# Patient Record
Sex: Male | Born: 1935 | Race: White | Hispanic: No | Marital: Married | Smoking: Former smoker
Health system: Southern US, Community
[De-identification: ages and names within clinical notes are randomized; demographics above are authoritative.]

## PROBLEM LIST (undated history)

## (undated) DIAGNOSIS — Z86718 Personal history of other venous thrombosis and embolism: Secondary | ICD-10-CM

## (undated) DIAGNOSIS — K627 Radiation proctitis: Secondary | ICD-10-CM

## (undated) DIAGNOSIS — D039 Melanoma in situ, unspecified: Secondary | ICD-10-CM

## (undated) DIAGNOSIS — F329 Major depressive disorder, single episode, unspecified: Secondary | ICD-10-CM

## (undated) DIAGNOSIS — Z862 Personal history of diseases of the blood and blood-forming organs and certain disorders involving the immune mechanism: Secondary | ICD-10-CM

## (undated) DIAGNOSIS — Z8619 Personal history of other infectious and parasitic diseases: Secondary | ICD-10-CM

## (undated) DIAGNOSIS — L120 Bullous pemphigoid: Secondary | ICD-10-CM

## (undated) DIAGNOSIS — R011 Cardiac murmur, unspecified: Secondary | ICD-10-CM

## (undated) DIAGNOSIS — C61 Malignant neoplasm of prostate: Secondary | ICD-10-CM

## (undated) DIAGNOSIS — Z974 Presence of external hearing-aid: Secondary | ICD-10-CM

## (undated) DIAGNOSIS — H8110 Benign paroxysmal vertigo, unspecified ear: Secondary | ICD-10-CM

## (undated) DIAGNOSIS — I209 Angina pectoris, unspecified: Secondary | ICD-10-CM

## (undated) DIAGNOSIS — I519 Heart disease, unspecified: Secondary | ICD-10-CM

## (undated) DIAGNOSIS — K219 Gastro-esophageal reflux disease without esophagitis: Secondary | ICD-10-CM

## (undated) DIAGNOSIS — Z8489 Family history of other specified conditions: Secondary | ICD-10-CM

## (undated) DIAGNOSIS — C799 Secondary malignant neoplasm of unspecified site: Secondary | ICD-10-CM

## (undated) DIAGNOSIS — C419 Malignant neoplasm of bone and articular cartilage, unspecified: Secondary | ICD-10-CM

## (undated) DIAGNOSIS — G4700 Insomnia, unspecified: Secondary | ICD-10-CM

## (undated) DIAGNOSIS — C439 Malignant melanoma of skin, unspecified: Secondary | ICD-10-CM

## (undated) DIAGNOSIS — K769 Liver disease, unspecified: Secondary | ICD-10-CM

## (undated) DIAGNOSIS — Z9289 Personal history of other medical treatment: Secondary | ICD-10-CM

## (undated) DIAGNOSIS — Z8679 Personal history of other diseases of the circulatory system: Secondary | ICD-10-CM

## (undated) DIAGNOSIS — I517 Cardiomegaly: Secondary | ICD-10-CM

## (undated) DIAGNOSIS — N2889 Other specified disorders of kidney and ureter: Secondary | ICD-10-CM

## (undated) DIAGNOSIS — Z9359 Other cystostomy status: Secondary | ICD-10-CM

## (undated) DIAGNOSIS — I2510 Atherosclerotic heart disease of native coronary artery without angina pectoris: Secondary | ICD-10-CM

## (undated) DIAGNOSIS — Z9889 Other specified postprocedural states: Secondary | ICD-10-CM

## (undated) DIAGNOSIS — N433 Hydrocele, unspecified: Secondary | ICD-10-CM

## (undated) DIAGNOSIS — Z87442 Personal history of urinary calculi: Secondary | ICD-10-CM

## (undated) DIAGNOSIS — E785 Hyperlipidemia, unspecified: Secondary | ICD-10-CM

## (undated) DIAGNOSIS — Z8719 Personal history of other diseases of the digestive system: Secondary | ICD-10-CM

## (undated) DIAGNOSIS — I700 Atherosclerosis of aorta: Secondary | ICD-10-CM

## (undated) DIAGNOSIS — Z923 Personal history of irradiation: Secondary | ICD-10-CM

## (undated) DIAGNOSIS — F32A Depression, unspecified: Secondary | ICD-10-CM

## (undated) HISTORY — DX: Personal history of irradiation: Z92.3

## (undated) HISTORY — DX: Melanoma in situ, unspecified: D03.9

## (undated) HISTORY — PX: TONSILLECTOMY: SUR1361

## (undated) HISTORY — DX: Hyperlipidemia, unspecified: E78.5

## (undated) HISTORY — PX: INGUINAL HERNIA REPAIR: SUR1180

## (undated) HISTORY — PX: CATARACT EXTRACTION, BILATERAL: SHX1313

## (undated) HISTORY — PX: NASAL FRACTURE SURGERY: SHX718

## (undated) HISTORY — DX: Secondary malignant neoplasm of unspecified site: C79.9

## (undated) HISTORY — DX: Gastro-esophageal reflux disease without esophagitis: K21.9

## (undated) HISTORY — DX: Malignant neoplasm of prostate: C61

## (undated) HISTORY — PX: FRACTURE SURGERY: SHX138

## (undated) HISTORY — PX: NASAL SEPTUM SURGERY: SHX37

## (undated) SURGERY — Surgical Case
Anesthesia: *Unknown

---
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
## 1998-04-06 ENCOUNTER — Ambulatory Visit (HOSPITAL_COMMUNITY): Admission: RE | Admit: 1998-04-06 | Discharge: 1998-04-06 | Payer: Self-pay | Admitting: Urology

## 1999-03-25 ENCOUNTER — Ambulatory Visit (HOSPITAL_COMMUNITY): Admission: RE | Admit: 1999-03-25 | Discharge: 1999-03-25 | Payer: Self-pay | Admitting: Gastroenterology

## 1999-05-09 ENCOUNTER — Encounter: Payer: Self-pay | Admitting: Internal Medicine

## 1999-05-09 ENCOUNTER — Ambulatory Visit: Admission: RE | Admit: 1999-05-09 | Discharge: 1999-05-09 | Payer: Self-pay | Admitting: Internal Medicine

## 2004-10-17 ENCOUNTER — Ambulatory Visit: Payer: Self-pay | Admitting: Internal Medicine

## 2004-10-31 ENCOUNTER — Ambulatory Visit: Payer: Self-pay | Admitting: Internal Medicine

## 2004-11-13 DIAGNOSIS — N433 Hydrocele, unspecified: Secondary | ICD-10-CM

## 2004-11-13 HISTORY — DX: Hydrocele, unspecified: N43.3

## 2005-05-12 ENCOUNTER — Ambulatory Visit: Payer: Self-pay | Admitting: Internal Medicine

## 2005-05-22 ENCOUNTER — Encounter: Admission: RE | Admit: 2005-05-22 | Discharge: 2005-05-22 | Payer: Self-pay | Admitting: Internal Medicine

## 2005-05-22 ENCOUNTER — Ambulatory Visit: Payer: Self-pay | Admitting: Internal Medicine

## 2005-10-16 ENCOUNTER — Ambulatory Visit: Payer: Self-pay | Admitting: Internal Medicine

## 2005-10-30 ENCOUNTER — Ambulatory Visit: Payer: Self-pay | Admitting: Internal Medicine

## 2006-05-08 ENCOUNTER — Ambulatory Visit: Payer: Self-pay | Admitting: Internal Medicine

## 2007-06-07 DIAGNOSIS — E785 Hyperlipidemia, unspecified: Secondary | ICD-10-CM

## 2007-06-07 DIAGNOSIS — Z860100 Personal history of colon polyps, unspecified: Secondary | ICD-10-CM | POA: Insufficient documentation

## 2007-07-04 ENCOUNTER — Ambulatory Visit: Payer: Self-pay | Admitting: Internal Medicine

## 2007-07-05 LAB — CONVERTED CEMR LAB
ALT: 20 units/L (ref 0–53)
AST: 22 units/L (ref 0–37)
Albumin: 3.9 g/dL (ref 3.5–5.2)
Alkaline Phosphatase: 64 units/L (ref 39–117)
Bilirubin, Direct: 0.1 mg/dL (ref 0.0–0.3)
Cholesterol: 185 mg/dL (ref 0–200)
HDL: 43.3 mg/dL (ref >39.0)
LDL Cholesterol: 125 mg/dL — ABNORMAL HIGH (ref 0–99)
PSA: 1.05 ng/mL (ref 0.10–4.00)
TSH: 2.42 microintl units/mL (ref 0.35–5.50)
Testosterone: 294.23 ng/dL — ABNORMAL LOW (ref 350.00–890)
Total Bilirubin: 1 mg/dL (ref 0.3–1.2)
Total CHOL/HDL Ratio: 4.3
Total Protein: 6.5 g/dL (ref 6.0–8.3)
Triglycerides: 86 mg/dL (ref 0–149)
VLDL: 17 mg/dL (ref 0–40)

## 2007-07-08 DIAGNOSIS — N529 Male erectile dysfunction, unspecified: Secondary | ICD-10-CM

## 2007-09-30 ENCOUNTER — Encounter: Payer: Self-pay | Admitting: Internal Medicine

## 2008-02-14 ENCOUNTER — Encounter: Payer: Self-pay | Admitting: Internal Medicine

## 2008-08-18 ENCOUNTER — Encounter: Payer: Self-pay | Admitting: Internal Medicine

## 2008-11-05 ENCOUNTER — Encounter: Payer: Self-pay | Admitting: Internal Medicine

## 2009-01-13 ENCOUNTER — Ambulatory Visit: Payer: Self-pay | Admitting: Family Medicine

## 2009-01-25 ENCOUNTER — Ambulatory Visit: Payer: Self-pay | Admitting: Family Medicine

## 2009-11-30 ENCOUNTER — Ambulatory Visit: Payer: Self-pay | Admitting: Family Medicine

## 2010-02-07 ENCOUNTER — Ambulatory Visit: Payer: Self-pay | Admitting: Family Medicine

## 2010-04-06 ENCOUNTER — Ambulatory Visit: Payer: Self-pay | Admitting: Family Medicine

## 2010-12-23 ENCOUNTER — Ambulatory Visit (INDEPENDENT_AMBULATORY_CARE_PROVIDER_SITE_OTHER): Payer: Medicare Other | Admitting: Family Medicine

## 2010-12-23 DIAGNOSIS — R079 Chest pain, unspecified: Secondary | ICD-10-CM

## 2011-04-17 ENCOUNTER — Telehealth: Payer: Self-pay | Admitting: Family Medicine

## 2011-04-17 NOTE — Telephone Encounter (Signed)
He was immunized against it in March of 2010.

## 2011-04-17 NOTE — Telephone Encounter (Signed)
PT & WIFE EXPECTING FIRST GRANDCHILD 8/12 AND HAS QUESTIONS IF NEED WHOOPING COUGH SHOT. PT IS 75 AND WIFE FRANCIS IS 67. PLEASE CALL PT TO ANSWER QUESTIONS.

## 2011-04-20 NOTE — Telephone Encounter (Signed)
Pt notified l/m.

## 2011-05-08 ENCOUNTER — Other Ambulatory Visit: Payer: Self-pay | Admitting: Family Medicine

## 2011-05-09 ENCOUNTER — Encounter: Payer: Self-pay | Admitting: Family Medicine

## 2011-06-05 ENCOUNTER — Other Ambulatory Visit: Payer: Self-pay | Admitting: Family Medicine

## 2011-11-02 ENCOUNTER — Ambulatory Visit (INDEPENDENT_AMBULATORY_CARE_PROVIDER_SITE_OTHER): Payer: Medicare Other | Admitting: Family Medicine

## 2011-11-02 ENCOUNTER — Telehealth: Payer: Self-pay

## 2011-11-02 ENCOUNTER — Encounter: Payer: Self-pay | Admitting: Family Medicine

## 2011-11-02 DIAGNOSIS — E785 Hyperlipidemia, unspecified: Secondary | ICD-10-CM

## 2011-11-02 DIAGNOSIS — R490 Dysphonia: Secondary | ICD-10-CM

## 2011-11-02 DIAGNOSIS — Z79899 Other long term (current) drug therapy: Secondary | ICD-10-CM

## 2011-11-02 DIAGNOSIS — Z86010 Personal history of colonic polyps: Secondary | ICD-10-CM

## 2011-11-02 LAB — CBC WITH DIFFERENTIAL/PLATELET
Basophils Relative: 2 % — ABNORMAL HIGH (ref 0–1)
Eosinophils Relative: 3 % (ref 0–5)
Lymphocytes Relative: 33 % (ref 12–46)
Lymphs Abs: 1.5 10*3/uL (ref 0.7–4.0)
MCHC: 33.2 g/dL (ref 30.0–36.0)
Monocytes Relative: 13 % — ABNORMAL HIGH (ref 3–12)
Neutro Abs: 2.2 10*3/uL (ref 1.7–7.7)
Neutrophils Relative %: 49 % (ref 43–77)
Platelets: 286 10*3/uL (ref 150–400)
RDW: 13.5 % (ref 11.5–15.5)

## 2011-11-02 LAB — COMPREHENSIVE METABOLIC PANEL
ALT: 10 U/L (ref 0–53)
AST: 19 U/L (ref 0–37)
Alkaline Phosphatase: 62 U/L (ref 39–117)
BUN: 17 mg/dL (ref 6–23)
Sodium: 138 mEq/L (ref 135–145)
Total Bilirubin: 0.6 mg/dL (ref 0.3–1.2)

## 2011-11-02 LAB — LIPID PANEL
Cholesterol: 249 mg/dL — ABNORMAL HIGH (ref 0–200)
HDL: 54 mg/dL (ref >39)
Total CHOL/HDL Ratio: 4.6 Ratio
Triglycerides: 70 mg/dL (ref <150)

## 2011-11-02 NOTE — Progress Notes (Signed)
  Subjective:    Patient ID: Corey Oneill, male    DOB: [birth year], 75 y.o.   MRN: [id number]  HPI He is here for an interval evaluation. He has had difficulty over the last year with a raspy sounding voice but no sore throat. He states that sometimes he feels as if food does get stuck but this is intermittent. This has been getting worse. He also has a history of colonic polyps and has been evaluated in the past by Dr. Kinnie Scales. He has noted a slight weight loss. He states it has been no change in his bowel habits, abdominal pain or anorexia. He is now a grandfather and is quite happy over this. He continues on medications listed in the chart. He does have a remote smoking history. Review of Systems     Objective:   Physical Exam alert and in no distress. Tympanic membranes and canals are normal. Throat is clear. Tonsils are normal. Neck is supple without adenopathy or thyromegaly. Cardiac exam shows a regular sinus rhythm without murmurs or gallops. Lungs are clear to auscultation.        Assessment & Plan:   1. Hoarse voice quality  Ambulatory referral to ENT  2. Hyperlipidemia LDL goal < 100  CBC with Differential, Comprehensive metabolic panel, Lipid panel  3. History of colonic polyps    4. Encounter for long-term (current) use of other medications  CBC with Differential, Comprehensive metabolic panel, Lipid panel, US Aorta Initial Medicare Screen   recommend he try Prilosec 40 mg to see if this will help with his raspy voice. He will followup with his gastroenterologist concerning his next colonoscopy. Routine abdominal aortic ultrasound ordered.

## 2011-11-02 NOTE — Patient Instructions (Signed)
Use 40mg  Prilosec at see if this helps the raspy voice

## 2011-11-02 NOTE — Telephone Encounter (Signed)
Left message for pt that he has Ultrasound on Dec 26 th @ 10 am @ [facility] [address] nothing to eat or drink after midnight

## 2011-11-06 ENCOUNTER — Telehealth: Payer: Self-pay | Admitting: Internal Medicine

## 2011-11-06 MED ORDER — SIMVASTATIN 40 MG PO TABS: 40.0000 mg | ORAL_TABLET | Freq: Every day | ORAL | Status: DC

## 2011-11-08 ENCOUNTER — Ambulatory Visit (INDEPENDENT_AMBULATORY_CARE_PROVIDER_SITE_OTHER): Payer: Medicare Other | Admitting: Family Medicine

## 2011-11-08 ENCOUNTER — Ambulatory Visit: Payer: PRIVATE HEALTH INSURANCE

## 2011-11-08 DIAGNOSIS — E785 Hyperlipidemia, unspecified: Secondary | ICD-10-CM

## 2011-11-08 MED ORDER — SIMVASTATIN 40 MG PO TABS: 40.0000 mg | ORAL_TABLET | Freq: Every day | ORAL | Status: DC

## 2011-11-08 NOTE — Telephone Encounter (Signed)
done

## 2011-11-08 NOTE — Patient Instructions (Signed)
Continue on Prilosec and your Zocor

## 2011-11-08 NOTE — Progress Notes (Signed)
  Subjective:    Patient ID: Corey Oneill, male    DOB: 06-14-1936, 75 y.o.   MRN: [id number]  HPI He is here for consultation concerning some blood work which did show elevated lipids. He stopped taking his statin because of fears concerning possible side effects. When he got the message to return here, he started back on his medications. He also notes that the Prilosec seems to be helping with his throat symptoms.   Review of Systems     Objective:   Physical Exam Alert and in no distress otherwise not examined      Assessment & Plan:   1. HYPERLIPIDEMIA    he'll continue on Zocor and recheck here in 2 months. He is to continue on Prilosec for the time being

## 2011-11-13 ENCOUNTER — Other Ambulatory Visit: Payer: Self-pay | Admitting: Family Medicine

## 2011-11-13 ENCOUNTER — Ambulatory Visit
Admission: RE | Admit: 2011-11-13 | Discharge: 2011-11-13 | Disposition: A | Discharge status: Home / Self Care | Payer: Medicare Other | Source: Ambulatory Visit | Attending: Family Medicine | Admitting: Family Medicine

## 2011-11-13 DIAGNOSIS — Z79899 Other long term (current) drug therapy: Secondary | ICD-10-CM

## 2011-11-21 DIAGNOSIS — J320 Chronic maxillary sinusitis: Secondary | ICD-10-CM | POA: Diagnosis not present

## 2011-11-27 DIAGNOSIS — J320 Chronic maxillary sinusitis: Secondary | ICD-10-CM | POA: Diagnosis not present

## 2011-12-22 ENCOUNTER — Telehealth: Payer: Self-pay | Admitting: Internal Medicine

## 2011-12-25 NOTE — Telephone Encounter (Signed)
He was referred to Evette Cristal for counseling

## 2012-01-22 DIAGNOSIS — F4322 Adjustment disorder with anxiety: Secondary | ICD-10-CM | POA: Diagnosis not present

## 2012-02-01 DIAGNOSIS — F4322 Adjustment disorder with anxiety: Secondary | ICD-10-CM | POA: Diagnosis not present

## 2012-04-17 ENCOUNTER — Encounter: Payer: Self-pay | Admitting: Family Medicine

## 2012-04-17 ENCOUNTER — Ambulatory Visit (INDEPENDENT_AMBULATORY_CARE_PROVIDER_SITE_OTHER): Payer: Medicare Other | Admitting: Family Medicine

## 2012-04-17 VITALS — BP 120/70 | HR 74 | Wt 162.0 lb

## 2012-04-17 DIAGNOSIS — Z86010 Personal history of colonic polyps: Secondary | ICD-10-CM | POA: Diagnosis not present

## 2012-04-17 DIAGNOSIS — R42 Dizziness and giddiness: Secondary | ICD-10-CM | POA: Diagnosis not present

## 2012-04-17 LAB — CBC WITH DIFFERENTIAL/PLATELET
Basophils Absolute: 0.1 10*3/uL (ref 0.0–0.1)
Basophils Relative: 2 % — ABNORMAL HIGH (ref 0–1)
Eosinophils Absolute: 0.2 10*3/uL (ref 0.0–0.7)
Lymphocytes Relative: 33 % (ref 12–46)
MCH: 31.2 pg (ref 26.0–34.0)
Monocytes Absolute: 0.5 10*3/uL (ref 0.1–1.0)
Neutro Abs: 2.2 10*3/uL (ref 1.7–7.7)
RBC: 4.33 MIL/uL (ref 4.22–5.81)
RDW: 13.3 % (ref 11.5–15.5)
WBC: 4.5 10*3/uL (ref 4.0–10.5)

## 2012-04-17 LAB — COMPREHENSIVE METABOLIC PANEL
AST: 21 U/L (ref 0–37)
Albumin: 4.2 g/dL (ref 3.5–5.2)
Alkaline Phosphatase: 61 U/L (ref 39–117)
CO2: 30 mEq/L (ref 19–32)
Chloride: 105 mEq/L (ref 96–112)
Creat: 0.93 mg/dL (ref 0.50–1.35)
Glucose, Bld: 78 mg/dL (ref 70–99)
Potassium: 4.8 mEq/L (ref 3.5–5.3)
Total Bilirubin: 0.6 mg/dL (ref 0.3–1.2)

## 2012-04-17 NOTE — Progress Notes (Signed)
  Subjective:    Patient ID: Corey Oneill, male    DOB: 09-26-1936, 76 y.o.   MRN: [id number]  HPI He is here for consultation. He states that over the last 6-8 months he has noted weight loss stating that his pants are fitting much looser. He has a previous history of colonic polyps and does need to be rescheduled for colonoscopy since it has been 5 years He's had no nausea, vomiting, abdominal pain, change in bowel habit, fever, chills, chest pain or shortness of breath. He has had 2 episodes of being lightheaded that lasted for several minutes but again none of the above symptoms plus blurred or double vision, weakness, heart rate changes.   Review of Systems     Objective:   Physical Exam alert and in no distress. Tympanic membranes and canals are normal. Throat is clear. Tonsils are normal. Neck is supple without adenopathy or thyromegaly. Cardiac exam shows a regular sinus rhythm without murmurs or gallops. Lungs are clear to auscultation. No carotid bruits noted. Donald exam shows no masses or tenderness       Assessment & Plan:   1. Light headed  CBC with Differential, Comprehensive metabolic panel  2. History of colonic polyps  CBC with Differential, Comprehensive metabolic panel, [facility] COLONOSCOPY   reassured him that I did not finding significant with the episodes of being lightheaded but did encourage him to be vigilant in that regard. I will also refer for colonoscopy since he is due for one anyway due to his underlying history of polyps.

## 2012-04-19 NOTE — Progress Notes (Signed)
Quick Note:  The blood work is normal ______ 

## 2012-04-29 DIAGNOSIS — L821 Other seborrheic keratosis: Secondary | ICD-10-CM | POA: Diagnosis not present

## 2012-04-29 DIAGNOSIS — L820 Inflamed seborrheic keratosis: Secondary | ICD-10-CM | POA: Diagnosis not present

## 2012-04-29 DIAGNOSIS — D485 Neoplasm of uncertain behavior of skin: Secondary | ICD-10-CM | POA: Diagnosis not present

## 2012-08-19 DIAGNOSIS — H40019 Open angle with borderline findings, low risk, unspecified eye: Secondary | ICD-10-CM | POA: Diagnosis not present

## 2012-08-19 DIAGNOSIS — H35379 Puckering of macula, unspecified eye: Secondary | ICD-10-CM | POA: Diagnosis not present

## 2012-08-19 DIAGNOSIS — H01009 Unspecified blepharitis unspecified eye, unspecified eyelid: Secondary | ICD-10-CM | POA: Diagnosis not present

## 2012-08-19 DIAGNOSIS — H2510 Age-related nuclear cataract, unspecified eye: Secondary | ICD-10-CM | POA: Diagnosis not present

## 2012-09-04 DIAGNOSIS — Z23 Encounter for immunization: Secondary | ICD-10-CM | POA: Diagnosis not present

## 2012-09-09 DIAGNOSIS — D126 Benign neoplasm of colon, unspecified: Secondary | ICD-10-CM | POA: Diagnosis not present

## 2012-09-09 DIAGNOSIS — K648 Other hemorrhoids: Secondary | ICD-10-CM | POA: Diagnosis not present

## 2012-09-09 DIAGNOSIS — Z86010 Personal history of colonic polyps: Secondary | ICD-10-CM | POA: Diagnosis not present

## 2012-09-10 ENCOUNTER — Encounter: Payer: Self-pay | Admitting: Family Medicine

## 2012-11-13 HISTORY — PX: PROSTATE BIOPSY: SHX241

## 2012-11-19 ENCOUNTER — Telehealth: Payer: Self-pay | Admitting: Internal Medicine

## 2012-11-19 DIAGNOSIS — Z0279 Encounter for issue of other medical certificate: Secondary | ICD-10-CM

## 2012-11-19 NOTE — Telephone Encounter (Signed)
Faxed over medical records for pt to [facility] processing center

## 2012-11-27 ENCOUNTER — Telehealth: Payer: Self-pay | Admitting: Family Medicine

## 2012-11-27 NOTE — Telephone Encounter (Signed)
Pt called and stated that he recently tried to purchase life insurance and they sent a rep out to his house for a at house "physical". He states that he received a call today stating that he has issues with some of his labs. He stated that he was informed that his HGB was 12.1 and is PSA was 5.14. He was informed that these were not within normal range. Pt is concerned. Pt has not had a cpe with [place] in a while and last time labs were drawn was back in June. Pt is willing to come in to see you but I'm not sure what kind of appointment to put him in for. Pt also states that he really wants to purchase this ins and these matters need to be addressed before this can happen. He did state that they would take a letter from you stating that he was in good health. Please call pt and address concerns or if a appointment is needed please let me know if you want him to have a cpe or what. It will be several weeks before we can get him in for a cpe, next week for a consult.

## 2012-11-27 NOTE — Telephone Encounter (Signed)
Set him up for a 30 minute visit

## 2012-11-28 NOTE — Telephone Encounter (Signed)
CALLED PT HE HAS APT ON Monday HE IS TO CALL Monday TO LET ME KNOW IF HE HAS HIS LABS AND HE IS ALSO GOING TO TRY AND HAVE HIS COMPANY FAX [place] A COPY

## 2012-12-02 ENCOUNTER — Other Ambulatory Visit: Payer: Self-pay | Admitting: Family Medicine

## 2012-12-02 ENCOUNTER — Ambulatory Visit (INDEPENDENT_AMBULATORY_CARE_PROVIDER_SITE_OTHER): Payer: Medicare Other | Admitting: Family Medicine

## 2012-12-02 ENCOUNTER — Encounter: Payer: Self-pay | Admitting: Family Medicine

## 2012-12-02 DIAGNOSIS — R972 Elevated prostate specific antigen [PSA]: Secondary | ICD-10-CM

## 2012-12-02 DIAGNOSIS — D649 Anemia, unspecified: Secondary | ICD-10-CM

## 2012-12-02 LAB — CBC WITH DIFFERENTIAL/PLATELET
Eosinophils Absolute: 0.1 10*3/uL (ref 0.0–0.7)
Eosinophils Relative: 3 % (ref 0–5)
HCT: 35.4 % — ABNORMAL LOW (ref 39.0–52.0)
Lymphocytes Relative: 30 % (ref 12–46)
MCH: 30.6 pg (ref 26.0–34.0)
MCV: 89.4 fL (ref 78.0–100.0)
Neutro Abs: 2.7 10*3/uL (ref 1.7–7.7)
Neutrophils Relative %: 54 % (ref 43–77)
RBC: 3.96 MIL/uL — ABNORMAL LOW (ref 4.22–5.81)
WBC: 4.8 10*3/uL (ref 4.0–10.5)

## 2012-12-02 NOTE — Progress Notes (Signed)
  Subjective:    Patient ID: Corey Oneill, male    DOB: Apr 11, 1936, 77 y.o.   MRN: [id number]  HPI He recently applied for an insurance plan. Blood work was taken which did show an elevated PSA as well as hemoglobin of 12. Both of these were outside of their norms. He is almost 77 years of age. He has no other concerns or complaints.   Review of Systems     Objective:   Physical Exam Alert and in no distress otherwise not examined       Assessment & Plan:   1. Abnormal PSA  PSA  2. Anemia  CBC with Differential   I discussed PSA with him in detail. Recommend that we repeat the PSA to see what the numbers today. Explained that the PSA can fluctuate greatly. I will also check a complete CBC to see if perhaps he needs iron supplementation. Explained that hemoglobin 12 is certainly not dangerous but is slightly low.

## 2012-12-03 LAB — PSA: PSA: 5.08 ng/mL — ABNORMAL HIGH (ref <=4.00)

## 2012-12-03 LAB — PSA, TOTAL AND FREE: PSA, Free Pct: 10 % — ABNORMAL LOW (ref >25)

## 2012-12-03 NOTE — Progress Notes (Signed)
Gave to jo 

## 2012-12-05 NOTE — Progress Notes (Signed)
Quick Note:  I discussed both the hemoglobin and his PSA. He will be on a multivitamin with extra iron to be on the safe side. We also discussed PSA and he would like referral to urology. Please to set that up. Send all the lab data ______

## 2012-12-06 NOTE — Progress Notes (Signed)
Quick Note:  FAXED ALL INFO TO [facility] ______

## 2012-12-13 ENCOUNTER — Telehealth: Payer: Self-pay | Admitting: Internal Medicine

## 2012-12-13 MED ORDER — SIMVASTATIN 40 MG PO TABS: 40.0000 mg | ORAL_TABLET | Freq: Every day | ORAL | Status: DC

## 2012-12-13 NOTE — Telephone Encounter (Signed)
Sent med in 

## 2012-12-16 ENCOUNTER — Telehealth: Payer: Self-pay | Admitting: Family Medicine

## 2012-12-16 NOTE — Telephone Encounter (Signed)
Please call concerning referral to [facility], he wants to give you an update

## 2012-12-17 NOTE — Telephone Encounter (Signed)
CALLED PT TO FIND OUT WHAT WAS GOING ON LEFT MESSAGE TO CALL ME BACK

## 2012-12-18 ENCOUNTER — Encounter: Payer: Self-pay | Admitting: Family Medicine

## 2012-12-30 ENCOUNTER — Telehealth: Payer: Self-pay | Admitting: Family Medicine

## 2012-12-30 NOTE — Telephone Encounter (Signed)
Call him up and have him stop by so I can take all of the disc is I have no record of him having difficulty with herpes.

## 2012-12-30 NOTE — Telephone Encounter (Signed)
WIFE said to remind you to order Valtrex for her husband to [facility]

## 2012-12-30 NOTE — Telephone Encounter (Signed)
PT WAS INFORMED TO COME IN HE SAID THAT HE WANTED TO WAIT TILL NEXT TIME

## 2013-01-22 DIAGNOSIS — R972 Elevated prostate specific antigen [PSA]: Secondary | ICD-10-CM | POA: Diagnosis not present

## 2013-02-13 DIAGNOSIS — C61 Malignant neoplasm of prostate: Secondary | ICD-10-CM | POA: Diagnosis not present

## 2013-02-18 DIAGNOSIS — C61 Malignant neoplasm of prostate: Secondary | ICD-10-CM | POA: Diagnosis not present

## 2013-02-26 ENCOUNTER — Telehealth: Payer: Self-pay | Admitting: *Deleted

## 2013-02-26 NOTE — Telephone Encounter (Signed)
CALLED PATIENT TO ALTER [place] VISIT FOR 03-06-13 DUE TO DR. Dayton Scrape BEING IN THE OR, RESCHEDULED FOR 03-05-13 1:30 PM FOR 2:00 PM APPT, SPOKE WITH PATIENT AND HE IS GOOD WITH NEW APPT. DATE AND TIME.

## 2013-03-03 ENCOUNTER — Encounter: Payer: Self-pay | Admitting: Oncology

## 2013-03-03 NOTE — Progress Notes (Signed)
GU Location of Tumor / Histology: prostate  If Prostate Cancer, Gleason Score is (3 + 4=7, 4 cores on the right, 3+3=6 , 2 cores on the right) and PSA is (4.97)  Patient presented 4 months ago with signs/symptoms of: elevated PSA  Biopsies of prostate (if applicable) revealed: adenocarcinoma of the prostate in 6 samples  Past/Anticipated interventions by urology, if any: prostate biopsy, possible lab assisted robotic prostatectomy  Past/Anticipated interventions by medical oncology, if any: none  Weight changes, if any: no    Bowel/Bladder complaints, if any:  Urinary stream weaker at night and returns to normal in the morning.  Usually gets up once per night to urinate.  Nausea/Vomiting, if any:  no  Pain issues, if any:  no  SAFETY ISSUES:  Prior radiation? no  Pacemaker/ICD? no  Possible current pregnancy? no  Is the patient on methotrexate? no  Current Complaints / other details:  Corey Oneill here for consult for prostate cancer.  He denies pain, fatigue, diarrhea, and hematuria.

## 2013-03-05 ENCOUNTER — Encounter: Payer: Self-pay | Admitting: Radiation Oncology

## 2013-03-05 ENCOUNTER — Ambulatory Visit
Admission: RE | Admit: 2013-03-05 | Discharge: 2013-03-05 | Disposition: A | Discharge status: Home / Self Care | Payer: Medicare Other | Source: Ambulatory Visit | Attending: Radiation Oncology | Admitting: Radiation Oncology

## 2013-03-05 VITALS — BP 132/66 | HR 83 | Temp 97.7°F | Ht 72.0 in | Wt 163.2 lb

## 2013-03-05 DIAGNOSIS — C61 Malignant neoplasm of prostate: Secondary | ICD-10-CM | POA: Insufficient documentation

## 2013-03-05 DIAGNOSIS — N529 Male erectile dysfunction, unspecified: Secondary | ICD-10-CM | POA: Insufficient documentation

## 2013-03-05 NOTE — Progress Notes (Signed)
Please see the Nurse Progress Note in the MD Initial Consult Encounter for this patient. 

## 2013-03-05 NOTE — Progress Notes (Signed)
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 male who is seen today for the courtesy of Dr. Mena Goes of for evaluation of his clinical stage T2 B. intermediate risk adenocarcinoma prostate. His PSA in August of 2004 was 0.4 rise and 1.05 by August of 2008. His next PSA in January 2014 was elevated at 5.08 and rechecked on January 20 and found to be 4.97 with a percentage free PSA of 10%. He was seen by Dr. Mena Goes who performed ultrasound-guided biopsies on 01/22/2013. 6 of 12 biopsies contained adenocarcinoma. He was found to have Gleason score 7 (3+4) involving 20% of one core from right lateral base, 20% of one core from the right mid gland, 20% of one core from the right lateral apex and 60% of one core from the right apex. He Gleason 6 (3+3) involving 60% of one core from the right lateral mid gland and 50% of one core from the right base. His gland volume was approximately 14.7 cc. He is doing well from a GU and GI standpoint. His I PSS score is 4. He does have erectile dysfunction. He was seen by Dr. Laverle Patter for discussion of robotic prostatectomy on April 8. He is a rather active 77 year old male who continues to work full-time.  PREVIOUS RADIATION THERAPY: No   PAST MEDICAL HISTORY:  has a past medical history of Hyperlipidemia; Esophageal reflux; and Prostate cancer.     PAST SURGICAL HISTORY:  Past Surgical History  Procedure Laterality Date  . Nose surgery    . Tonsillectomy    . Needle biopsy of prostate    . Colonoscopy       FAMILY HISTORY: family history includes Brain cancer in his father; Depression in his daughter;  and Lymphoma in his mother. His mother died from lymphoma at age 24. His father died of a primary brain tumor 41. No known family history of prostate cancer.   SOCIAL HISTORY:  reports that he quit smoking about 45 years ago. He has never used smokeless tobacco. He reports that he drinks about 7.5 ounces of alcohol per week. He reports that he does not use illicit drugs. Married, 2 children. He works as a Building control surveyor.   ALLERGIES: Review of patient's allergies indicates no known allergies.   MEDICATIONS:  Current Outpatient Prescriptions  Medication Sig Dispense Refill  . aspirin 325 MG tablet Take 325 mg by mouth daily.        . fish oil-omega-3 fatty acids 1000 MG capsule Take 2 g by mouth daily.        [name] guaiFENesin (MUCINEX) 600 MG 12 hr tablet Take 1,200 mg by mouth 2 (two) times daily.      . Multiple Vitamin (MULTIVITAMIN) tablet Take 1 tablet by mouth daily.        . simvastatin (ZOCOR) 40 MG tablet Take 1 tablet (40 mg total) by mouth at bedtime.  30 tablet  11   No current facility-administered medications for this encounter.     REVIEW OF SYSTEMS:  Pertinent items are noted in HPI.    PHYSICAL EXAM:  height is 6' (1.829 m) and weight is 163 lb 3.2 oz (74.027 kg). His temperature is 97.7 F (36.5 C). His blood pressure is 132/66 and his pulse is 83.   Alert and oriented 77 year old white male appearing younger than stated age. Head and neck examination: Grossly unremarkable except for healing laceration along his right temple from a recent fall. Nodes: Without palpable cervical or supraclavicular adenopathy. Chest: Lungs clear. Back: Without spinal or CVA tenderness. Heart: Regular rate rhythm. Abdomen: Without masses organomegaly. Genitalia: Unremarkable to inspection. Rectal: His prostate gland is small and there is diffuse induration along the entire right lobe. There is no discrete nodularity or periprostatic tumor extension. Extremities without edema.  Neurologic examination: Grossly nonfocal.   LABORATORY DATA:  Lab Results  Component Value Date   WBC 4.8 12/02/2012   HGB 12.1* 12/02/2012   HCT 35.4* 12/02/2012   MCV 89.4 12/02/2012   PLT 244 12/02/2012   Lab Results  Component Value Date   NA 142 04/17/2012   K 4.8 04/17/2012   CL 105 04/17/2012   CO2 30 04/17/2012   Lab Results  Component Value Date   ALT 15 04/17/2012   AST 21 04/17/2012   ALKPHOS 61 04/17/2012   BILITOT 0.6 04/17/2012   PSA 4.97 from 12/02/2012.   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 I explained to the patient that his prognosis is related to his PSA level, Gleason score, and clinical stage. His clinical stage and PSA level are favorable while his Gleason score of 7 is of intermediate favorability. We discussed surgery versus close surveillance/observation versus radiation therapy. He is not a candidate for seed implantation as monotherapy, or a boost because of his small gland volume making it dosimetrically quite difficult. His only radiation therapy option is 8 weeks of external beam/IMRT. We also discussed short-term androgen deprivation therapy which is currently being studied by the RTOG. The benefit is uncertain, I feel that there is a significant downside for this patient's quality of life. We discussed the potential acute and late toxicities of radiation therapy. He needs to continue to work which includes travel through [place] and [place] which complicate an 8 week treatment schedule. We could treat him late on Mondays and early on Fridays to accommodate his work schedule. I understand that he'll see Dr. Mena Goes in the near future. He can contact me if you would like to proceed with external beam/IMRT. He knows that we need to have 3 gold seeds placed within the prostate for image guidance if he chooses IMRT.   PLAN: As discussed above. He'll make a decision after seeing Dr. Mena Goes for a followup visit.   I spent 60 minutes  minutes face to face with the patient and more than 50% of that time was spent in counseling and/or coordination of care.

## 2013-03-06 ENCOUNTER — Ambulatory Visit: Payer: Medicare Other

## 2013-03-06 ENCOUNTER — Ambulatory Visit: Payer: Medicare Other | Admitting: Radiation Oncology

## 2013-03-11 ENCOUNTER — Encounter: Payer: Self-pay | Admitting: Radiation Oncology

## 2013-03-11 ENCOUNTER — Telehealth: Payer: Self-pay | Admitting: *Deleted

## 2013-03-11 NOTE — Telephone Encounter (Signed)
Called patient to inform of gold seed placement for 03-27-13 - arrival time - 11:15 am at Dr. Estil Daft Office and his sim on Monday May 19 at 9:00 am at Dr. Rennie Plowman Office, spoke with patient and he is aware of these appts.

## 2013-03-11 NOTE — Progress Notes (Addendum)
Chart note: The patient called this morning, and he wants to proceed with external beam/IMRT. Again, he is not a candidate for seed implantation because of his small prostate volume. I told the patient that we'll need to have Dr. Mena Goes placed 3 gold markers within the prostate for image guidance during his radiation therapy. After this is performed will have him return for simulation/treatment planning. We will try to be somewhat creative getting him treated around his work schedule. Consent will be signed when he returns for simulation/treatment planning.

## 2013-03-12 NOTE — Addendum Note (Signed)
Encounter addended by: Delynn Flavin, RN on: 03/12/2013  6:50 PM<BR>     Documentation filed: Charges VN

## 2013-03-24 DIAGNOSIS — M25539 Pain in unspecified wrist: Secondary | ICD-10-CM | POA: Diagnosis not present

## 2013-03-24 DIAGNOSIS — M19019 Primary osteoarthritis, unspecified shoulder: Secondary | ICD-10-CM | POA: Diagnosis not present

## 2013-03-27 DIAGNOSIS — IMO0002 Reserved for concepts with insufficient information to code with codable children: Secondary | ICD-10-CM | POA: Diagnosis not present

## 2013-03-27 DIAGNOSIS — C61 Malignant neoplasm of prostate: Secondary | ICD-10-CM | POA: Diagnosis not present

## 2013-03-28 ENCOUNTER — Telehealth: Payer: Self-pay | Admitting: *Deleted

## 2013-03-28 NOTE — Telephone Encounter (Signed)
CALLED PATIENT TO REMIND OF APPT. FOR 03-31-13, SPOKE WITH PATIENT AND HE SAID THAT HE WOULD BE HERE

## 2013-03-31 ENCOUNTER — Encounter: Payer: Self-pay | Admitting: Radiation Oncology

## 2013-03-31 ENCOUNTER — Ambulatory Visit
Admission: RE | Admit: 2013-03-31 | Discharge: 2013-03-31 | Disposition: A | Discharge status: Home / Self Care | Payer: Medicare Other | Source: Ambulatory Visit | Attending: Radiation Oncology | Admitting: Radiation Oncology

## 2013-03-31 DIAGNOSIS — R350 Frequency of micturition: Secondary | ICD-10-CM | POA: Insufficient documentation

## 2013-03-31 DIAGNOSIS — C61 Malignant neoplasm of prostate: Secondary | ICD-10-CM

## 2013-03-31 DIAGNOSIS — Z510 Encounter for antineoplastic radiation therapy: Secondary | ICD-10-CM | POA: Insufficient documentation

## 2013-03-31 DIAGNOSIS — R351 Nocturia: Secondary | ICD-10-CM | POA: Diagnosis not present

## 2013-03-31 NOTE — Progress Notes (Signed)
Complex simulation/treatment planning note: Corey Oneill was taken to the CT simulator and placed supine. A custom vac lock immobilization device was constructed. A red rubber tube was placed within the rectal vault. He was then catheterized and contrast instilled into the bladder/urethra. He was then scanned. I contoured his prostate, seminal vesicles, bladder, and rectum. I prescribing 7800 cGy in 40 sessions to his prostate PTV which represents the prostate was 0.8 cm except for 0.5 cm along the rectum. I prescribing 5600 cGy in 40 sessions to his seminal vesicles which include the seminal vesicles +0.5 cm. He'll undergo daily image guidance setting up to his 3 gold seeds. He is now ready for IMRT simulation/treatment planning.

## 2013-03-31 NOTE — Progress Notes (Signed)
Met with patient to discuss RO billing.  Patient had no concerns today. 

## 2013-04-01 ENCOUNTER — Telehealth: Payer: Self-pay | Admitting: Internal Medicine

## 2013-04-01 NOTE — Telephone Encounter (Signed)
Pt would like to talk to you and has questions about his prostate cancer. If you would please call him.

## 2013-04-02 ENCOUNTER — Encounter: Payer: Self-pay | Admitting: Radiation Oncology

## 2013-04-02 DIAGNOSIS — Z510 Encounter for antineoplastic radiation therapy: Secondary | ICD-10-CM | POA: Diagnosis not present

## 2013-04-02 DIAGNOSIS — R351 Nocturia: Secondary | ICD-10-CM | POA: Diagnosis not present

## 2013-04-02 DIAGNOSIS — C61 Malignant neoplasm of prostate: Secondary | ICD-10-CM | POA: Diagnosis not present

## 2013-04-02 DIAGNOSIS — R350 Frequency of micturition: Secondary | ICD-10-CM | POA: Diagnosis not present

## 2013-04-02 NOTE — Progress Notes (Signed)
IMRT simulation/treatment planning note: Corey Oneill completed IMRT simulation/treatment planning today in the management of his carcinoma the prostate. IMRT was chosen to decrease the risk for both acute and late bladder and rectal toxicity compared to conventional radiation therapy or 3-D conformal radiation therapy. Dose volume histograms were obtained for the target structures including the prostate and seminal vesicles along with avoidance structures including the bladder, rectum, and femoral heads. He was set up to 2 modulated arcs with 6 MV photons, and we had better low-dose sparing of the rectum with 10 MV photons. I prescribing 7800 cGy in 40 sessions to his prostate and 5600 cGy in 40 sessions to his seminal vesicles. He'll undergo daily cone beam CT setting up to his 3 gold seeds along with treatment with a comfortably full bladder.

## 2013-04-10 ENCOUNTER — Ambulatory Visit: Payer: Medicare Other | Admitting: Radiation Oncology

## 2013-04-11 ENCOUNTER — Ambulatory Visit: Payer: Medicare Other

## 2013-04-14 ENCOUNTER — Ambulatory Visit
Admission: RE | Admit: 2013-04-14 | Discharge: 2013-04-14 | Disposition: A | Discharge status: Home / Self Care | Payer: Medicare Other | Source: Ambulatory Visit | Attending: Radiation Oncology | Admitting: Radiation Oncology

## 2013-04-14 ENCOUNTER — Ambulatory Visit: Payer: Medicare Other

## 2013-04-14 VITALS — BP 140/75 | HR 69 | Temp 97.6°F | Wt 160.6 lb

## 2013-04-14 DIAGNOSIS — R351 Nocturia: Secondary | ICD-10-CM | POA: Diagnosis not present

## 2013-04-14 DIAGNOSIS — Z510 Encounter for antineoplastic radiation therapy: Secondary | ICD-10-CM | POA: Diagnosis not present

## 2013-04-14 DIAGNOSIS — C61 Malignant neoplasm of prostate: Secondary | ICD-10-CM

## 2013-04-14 DIAGNOSIS — M25539 Pain in unspecified wrist: Secondary | ICD-10-CM | POA: Diagnosis not present

## 2013-04-14 DIAGNOSIS — R350 Frequency of micturition: Secondary | ICD-10-CM | POA: Diagnosis not present

## 2013-04-14 DIAGNOSIS — M19019 Primary osteoarthritis, unspecified shoulder: Secondary | ICD-10-CM | POA: Diagnosis not present

## 2013-04-14 NOTE — Progress Notes (Signed)
The patient began his IMRT today in the management of his carcinoma the prostate. He is being treated with 2 modulated arcs (VMAT) which represent one set of IMRT treatment devices [phone])

## 2013-04-14 NOTE — Progress Notes (Signed)
Weekly Management Note:  Site: Prostate/seminal vesicles Current Dose:  195  cGy Projected Dose: 7800  cGy  Narrative: The patient is seen today for routine under treatment assessment. CBCT/MVCT images/port films were reviewed. The chart was reviewed.   Bladder filling is excellent. No new GU or GI difficulties.  Physical Examination: There were no vitals filed for this visit..  Weight:  . No change.  Impression: Tolerating radiation therapy well.  Plan: Continue radiation therapy as planned.

## 2013-04-14 NOTE — Progress Notes (Signed)
Patient and wife  here for weekly assessment of radiation for prostate cancer.Routien of clinic reviewed and side effects of treatment reviewed using Teachback method.Given Radiation Therapy and You Booklet.

## 2013-04-15 ENCOUNTER — Ambulatory Visit: Payer: Medicare Other

## 2013-04-15 ENCOUNTER — Ambulatory Visit
Admission: RE | Admit: 2013-04-15 | Discharge: 2013-04-15 | Disposition: A | Discharge status: Home / Self Care | Payer: Medicare Other | Source: Ambulatory Visit | Attending: Radiation Oncology | Admitting: Radiation Oncology

## 2013-04-15 DIAGNOSIS — R350 Frequency of micturition: Secondary | ICD-10-CM | POA: Diagnosis not present

## 2013-04-15 DIAGNOSIS — R351 Nocturia: Secondary | ICD-10-CM | POA: Diagnosis not present

## 2013-04-15 DIAGNOSIS — Z510 Encounter for antineoplastic radiation therapy: Secondary | ICD-10-CM | POA: Diagnosis not present

## 2013-04-15 DIAGNOSIS — C61 Malignant neoplasm of prostate: Secondary | ICD-10-CM | POA: Diagnosis not present

## 2013-04-16 ENCOUNTER — Ambulatory Visit
Admission: RE | Admit: 2013-04-16 | Discharge: 2013-04-16 | Disposition: A | Discharge status: Home / Self Care | Payer: Medicare Other | Source: Ambulatory Visit | Attending: Radiation Oncology | Admitting: Radiation Oncology

## 2013-04-16 ENCOUNTER — Ambulatory Visit: Payer: Medicare Other

## 2013-04-16 DIAGNOSIS — C61 Malignant neoplasm of prostate: Secondary | ICD-10-CM | POA: Diagnosis not present

## 2013-04-16 DIAGNOSIS — Z510 Encounter for antineoplastic radiation therapy: Secondary | ICD-10-CM | POA: Diagnosis not present

## 2013-04-16 DIAGNOSIS — R351 Nocturia: Secondary | ICD-10-CM | POA: Diagnosis not present

## 2013-04-16 DIAGNOSIS — R350 Frequency of micturition: Secondary | ICD-10-CM | POA: Diagnosis not present

## 2013-04-17 ENCOUNTER — Ambulatory Visit
Admission: RE | Admit: 2013-04-17 | Discharge: 2013-04-17 | Disposition: A | Discharge status: Home / Self Care | Payer: Medicare Other | Source: Ambulatory Visit | Attending: Radiation Oncology | Admitting: Radiation Oncology

## 2013-04-17 ENCOUNTER — Ambulatory Visit: Payer: Medicare Other

## 2013-04-17 DIAGNOSIS — Z510 Encounter for antineoplastic radiation therapy: Secondary | ICD-10-CM | POA: Diagnosis not present

## 2013-04-17 DIAGNOSIS — R351 Nocturia: Secondary | ICD-10-CM | POA: Diagnosis not present

## 2013-04-17 DIAGNOSIS — R350 Frequency of micturition: Secondary | ICD-10-CM | POA: Diagnosis not present

## 2013-04-17 DIAGNOSIS — C61 Malignant neoplasm of prostate: Secondary | ICD-10-CM | POA: Diagnosis not present

## 2013-04-18 ENCOUNTER — Ambulatory Visit
Admission: RE | Admit: 2013-04-18 | Discharge: 2013-04-18 | Disposition: A | Discharge status: Home / Self Care | Payer: Medicare Other | Source: Ambulatory Visit | Attending: Radiation Oncology | Admitting: Radiation Oncology

## 2013-04-18 ENCOUNTER — Ambulatory Visit: Payer: Medicare Other

## 2013-04-18 DIAGNOSIS — Z510 Encounter for antineoplastic radiation therapy: Secondary | ICD-10-CM | POA: Diagnosis not present

## 2013-04-18 DIAGNOSIS — C61 Malignant neoplasm of prostate: Secondary | ICD-10-CM | POA: Diagnosis not present

## 2013-04-18 DIAGNOSIS — R350 Frequency of micturition: Secondary | ICD-10-CM | POA: Diagnosis not present

## 2013-04-18 DIAGNOSIS — R351 Nocturia: Secondary | ICD-10-CM | POA: Diagnosis not present

## 2013-04-21 ENCOUNTER — Ambulatory Visit
Admission: RE | Admit: 2013-04-21 | Discharge: 2013-04-21 | Disposition: A | Discharge status: Home / Self Care | Payer: Medicare Other | Source: Ambulatory Visit | Attending: Radiation Oncology | Admitting: Radiation Oncology

## 2013-04-21 ENCOUNTER — Ambulatory Visit: Payer: Medicare Other

## 2013-04-21 ENCOUNTER — Encounter: Payer: Self-pay | Admitting: Radiation Oncology

## 2013-04-21 VITALS — BP 123/78 | HR 73 | Temp 97.8°F | Resp 20 | Wt 157.9 lb

## 2013-04-21 DIAGNOSIS — C61 Malignant neoplasm of prostate: Secondary | ICD-10-CM | POA: Diagnosis not present

## 2013-04-21 DIAGNOSIS — R351 Nocturia: Secondary | ICD-10-CM | POA: Diagnosis not present

## 2013-04-21 DIAGNOSIS — Z510 Encounter for antineoplastic radiation therapy: Secondary | ICD-10-CM | POA: Diagnosis not present

## 2013-04-21 DIAGNOSIS — R350 Frequency of micturition: Secondary | ICD-10-CM | POA: Diagnosis not present

## 2013-04-21 NOTE — Progress Notes (Signed)
Weekly put visit, 6/40prostate rad tx completed, no dysuria, increased frequency voiding, last week was light headed after txs  aouple days, have resolved, bowels regular n, up 1-2 x night, started elatonin last night, slept better 8:32 AM

## 2013-04-21 NOTE — Progress Notes (Signed)
Weekly Management Note:  Site: Current Dose:  1170  cGy Projected Dose: 7800  cGy  Narrative: The patient is seen today for routine under treatment assessment. CBCT/MVCT images/port films were reviewed. The chart was reviewed.   Bladder filling is excellent. No new GU or GI difficulties. He was lightheaded for the better part of one day last week and he sees Dr. Haroldine Laws for further evaluation. He's not on an alpha blocker, it doesn't sound as if he had a TIA. We talked about prostate cancer and nutrition today.  Physical Examination:  Filed Vitals:   04/21/13 0827  BP: 123/78  Pulse: 73  Temp: 97.8 F (36.6 C)  Resp: 20  .  Weight: 157 lb 14.4 oz (71.623 kg). No change.  Impression: Tolerating radiation therapy well.  Plan: Continue radiation therapy as planned. He was given literature for review regarding prostate cancer and nutrition.

## 2013-04-22 ENCOUNTER — Ambulatory Visit
Admission: RE | Admit: 2013-04-22 | Discharge: 2013-04-22 | Disposition: A | Discharge status: Home / Self Care | Payer: Medicare Other | Source: Ambulatory Visit | Attending: Radiation Oncology | Admitting: Radiation Oncology

## 2013-04-22 ENCOUNTER — Ambulatory Visit: Payer: Medicare Other

## 2013-04-22 DIAGNOSIS — R490 Dysphonia: Secondary | ICD-10-CM | POA: Diagnosis not present

## 2013-04-22 DIAGNOSIS — C61 Malignant neoplasm of prostate: Secondary | ICD-10-CM | POA: Diagnosis not present

## 2013-04-22 DIAGNOSIS — R351 Nocturia: Secondary | ICD-10-CM | POA: Diagnosis not present

## 2013-04-22 DIAGNOSIS — R350 Frequency of micturition: Secondary | ICD-10-CM | POA: Diagnosis not present

## 2013-04-22 DIAGNOSIS — Z510 Encounter for antineoplastic radiation therapy: Secondary | ICD-10-CM | POA: Diagnosis not present

## 2013-04-23 ENCOUNTER — Ambulatory Visit
Admission: RE | Admit: 2013-04-23 | Discharge: 2013-04-23 | Disposition: A | Discharge status: Home / Self Care | Payer: Medicare Other | Source: Ambulatory Visit | Attending: Radiation Oncology | Admitting: Radiation Oncology

## 2013-04-23 ENCOUNTER — Ambulatory Visit: Payer: Medicare Other

## 2013-04-23 DIAGNOSIS — C61 Malignant neoplasm of prostate: Secondary | ICD-10-CM | POA: Diagnosis not present

## 2013-04-23 DIAGNOSIS — Z510 Encounter for antineoplastic radiation therapy: Secondary | ICD-10-CM | POA: Diagnosis not present

## 2013-04-23 DIAGNOSIS — R351 Nocturia: Secondary | ICD-10-CM | POA: Diagnosis not present

## 2013-04-23 DIAGNOSIS — R350 Frequency of micturition: Secondary | ICD-10-CM | POA: Diagnosis not present

## 2013-04-24 ENCOUNTER — Ambulatory Visit
Admission: RE | Admit: 2013-04-24 | Discharge: 2013-04-24 | Disposition: A | Discharge status: Home / Self Care | Payer: Medicare Other | Source: Ambulatory Visit | Attending: Radiation Oncology | Admitting: Radiation Oncology

## 2013-04-24 ENCOUNTER — Ambulatory Visit: Payer: Medicare Other

## 2013-04-24 DIAGNOSIS — Z510 Encounter for antineoplastic radiation therapy: Secondary | ICD-10-CM | POA: Diagnosis not present

## 2013-04-24 DIAGNOSIS — R351 Nocturia: Secondary | ICD-10-CM | POA: Diagnosis not present

## 2013-04-24 DIAGNOSIS — R350 Frequency of micturition: Secondary | ICD-10-CM | POA: Diagnosis not present

## 2013-04-24 DIAGNOSIS — C61 Malignant neoplasm of prostate: Secondary | ICD-10-CM | POA: Diagnosis not present

## 2013-04-25 ENCOUNTER — Ambulatory Visit: Payer: Medicare Other

## 2013-04-25 ENCOUNTER — Ambulatory Visit
Admission: RE | Admit: 2013-04-25 | Discharge: 2013-04-25 | Disposition: A | Discharge status: Home / Self Care | Payer: Medicare Other | Source: Ambulatory Visit | Attending: Radiation Oncology | Admitting: Radiation Oncology

## 2013-04-25 DIAGNOSIS — C61 Malignant neoplasm of prostate: Secondary | ICD-10-CM | POA: Diagnosis not present

## 2013-04-25 DIAGNOSIS — Z510 Encounter for antineoplastic radiation therapy: Secondary | ICD-10-CM | POA: Diagnosis not present

## 2013-04-25 DIAGNOSIS — R350 Frequency of micturition: Secondary | ICD-10-CM | POA: Diagnosis not present

## 2013-04-25 DIAGNOSIS — R351 Nocturia: Secondary | ICD-10-CM | POA: Diagnosis not present

## 2013-04-28 ENCOUNTER — Ambulatory Visit: Payer: Medicare Other

## 2013-04-28 ENCOUNTER — Ambulatory Visit
Admission: RE | Admit: 2013-04-28 | Discharge: 2013-04-28 | Disposition: A | Discharge status: Home / Self Care | Payer: Medicare Other | Source: Ambulatory Visit | Attending: Radiation Oncology | Admitting: Radiation Oncology

## 2013-04-28 VITALS — BP 120/69 | HR 72 | Temp 97.6°F | Wt 159.4 lb

## 2013-04-28 DIAGNOSIS — C61 Malignant neoplasm of prostate: Secondary | ICD-10-CM

## 2013-04-28 DIAGNOSIS — R350 Frequency of micturition: Secondary | ICD-10-CM | POA: Diagnosis not present

## 2013-04-28 DIAGNOSIS — Z510 Encounter for antineoplastic radiation therapy: Secondary | ICD-10-CM | POA: Diagnosis not present

## 2013-04-28 DIAGNOSIS — R351 Nocturia: Secondary | ICD-10-CM | POA: Diagnosis not present

## 2013-04-28 NOTE — Progress Notes (Signed)
Patient here for weekly assessment of radiation to prostate.Reviewed routine of clinic and side effects to include frequency of urination, urgency and bowel changes.To get imodium ad for diarrhea, and to continue routine of exercise within normal limits for him.Push po fluids to consist more of water.Completed 11 of 40 treatments.Fatigue related more to difficulty sleeping at night.Nocturia up to 4 times.Given Radiation Therapy and You Booklet.

## 2013-04-28 NOTE — Progress Notes (Signed)
Weekly Management Note:  Site: Prostate Current Dose:  2145  cGy Projected Dose: 7800  cGy  Narrative: The patient is seen today for routine under treatment assessment. CBCT/MVCT images/port films were reviewed. The chart was reviewed.   Bladder filling is satisfactory. No new GU or GI difficulties except for some increase in urinary frequency with nocturia x3-4. He denies diarrhea.  Physical Examination:  Filed Vitals:   04/28/13 0840  BP: 120/69  Pulse: 72  Temp: 97.6 F (36.4 C)  .  Weight: 159 lb 6.4 oz (72.303 kg). No change.  Impression: Tolerating radiation therapy well.  Plan: Continue radiation therapy as planned.

## 2013-04-29 ENCOUNTER — Ambulatory Visit
Admission: RE | Admit: 2013-04-29 | Discharge: 2013-04-29 | Disposition: A | Discharge status: Home / Self Care | Payer: Medicare Other | Source: Ambulatory Visit | Attending: Radiation Oncology | Admitting: Radiation Oncology

## 2013-04-29 ENCOUNTER — Ambulatory Visit: Payer: Medicare Other

## 2013-04-29 DIAGNOSIS — Z510 Encounter for antineoplastic radiation therapy: Secondary | ICD-10-CM | POA: Diagnosis not present

## 2013-04-29 DIAGNOSIS — R351 Nocturia: Secondary | ICD-10-CM | POA: Diagnosis not present

## 2013-04-29 DIAGNOSIS — R350 Frequency of micturition: Secondary | ICD-10-CM | POA: Diagnosis not present

## 2013-04-29 DIAGNOSIS — C61 Malignant neoplasm of prostate: Secondary | ICD-10-CM | POA: Diagnosis not present

## 2013-04-30 ENCOUNTER — Ambulatory Visit
Admission: RE | Admit: 2013-04-30 | Discharge: 2013-04-30 | Disposition: A | Discharge status: Home / Self Care | Payer: Medicare Other | Source: Ambulatory Visit | Attending: Radiation Oncology | Admitting: Radiation Oncology

## 2013-04-30 ENCOUNTER — Ambulatory Visit: Payer: Medicare Other

## 2013-04-30 DIAGNOSIS — Z510 Encounter for antineoplastic radiation therapy: Secondary | ICD-10-CM | POA: Diagnosis not present

## 2013-04-30 DIAGNOSIS — C61 Malignant neoplasm of prostate: Secondary | ICD-10-CM | POA: Diagnosis not present

## 2013-04-30 DIAGNOSIS — R351 Nocturia: Secondary | ICD-10-CM | POA: Diagnosis not present

## 2013-04-30 DIAGNOSIS — R350 Frequency of micturition: Secondary | ICD-10-CM | POA: Diagnosis not present

## 2013-05-01 ENCOUNTER — Ambulatory Visit: Payer: Medicare Other

## 2013-05-01 ENCOUNTER — Ambulatory Visit
Admission: RE | Admit: 2013-05-01 | Discharge: 2013-05-01 | Disposition: A | Discharge status: Home / Self Care | Payer: Medicare Other | Source: Ambulatory Visit | Attending: Radiation Oncology | Admitting: Radiation Oncology

## 2013-05-01 DIAGNOSIS — Z510 Encounter for antineoplastic radiation therapy: Secondary | ICD-10-CM | POA: Diagnosis not present

## 2013-05-01 DIAGNOSIS — R350 Frequency of micturition: Secondary | ICD-10-CM | POA: Diagnosis not present

## 2013-05-01 DIAGNOSIS — C61 Malignant neoplasm of prostate: Secondary | ICD-10-CM | POA: Diagnosis not present

## 2013-05-01 DIAGNOSIS — R351 Nocturia: Secondary | ICD-10-CM | POA: Diagnosis not present

## 2013-05-02 ENCOUNTER — Ambulatory Visit: Payer: Medicare Other

## 2013-05-02 ENCOUNTER — Ambulatory Visit
Admission: RE | Admit: 2013-05-02 | Discharge: 2013-05-02 | Disposition: A | Discharge status: Home / Self Care | Payer: Medicare Other | Source: Ambulatory Visit | Attending: Radiation Oncology | Admitting: Radiation Oncology

## 2013-05-02 DIAGNOSIS — R350 Frequency of micturition: Secondary | ICD-10-CM | POA: Diagnosis not present

## 2013-05-02 DIAGNOSIS — C61 Malignant neoplasm of prostate: Secondary | ICD-10-CM | POA: Diagnosis not present

## 2013-05-02 DIAGNOSIS — R351 Nocturia: Secondary | ICD-10-CM | POA: Diagnosis not present

## 2013-05-02 DIAGNOSIS — Z510 Encounter for antineoplastic radiation therapy: Secondary | ICD-10-CM | POA: Diagnosis not present

## 2013-05-02 DIAGNOSIS — J322 Chronic ethmoidal sinusitis: Secondary | ICD-10-CM | POA: Diagnosis not present

## 2013-05-05 ENCOUNTER — Ambulatory Visit
Admission: RE | Admit: 2013-05-05 | Discharge: 2013-05-05 | Disposition: A | Discharge status: Home / Self Care | Payer: Medicare Other | Source: Ambulatory Visit | Attending: Radiation Oncology | Admitting: Radiation Oncology

## 2013-05-05 ENCOUNTER — Ambulatory Visit: Payer: Medicare Other

## 2013-05-05 ENCOUNTER — Encounter: Payer: Self-pay | Admitting: Radiation Oncology

## 2013-05-05 VITALS — BP 112/62 | HR 78 | Resp 18 | Wt 158.8 lb

## 2013-05-05 DIAGNOSIS — C61 Malignant neoplasm of prostate: Secondary | ICD-10-CM | POA: Diagnosis not present

## 2013-05-05 DIAGNOSIS — R350 Frequency of micturition: Secondary | ICD-10-CM | POA: Diagnosis not present

## 2013-05-05 DIAGNOSIS — R351 Nocturia: Secondary | ICD-10-CM | POA: Diagnosis not present

## 2013-05-05 DIAGNOSIS — Z510 Encounter for antineoplastic radiation therapy: Secondary | ICD-10-CM | POA: Diagnosis not present

## 2013-05-05 NOTE — Progress Notes (Signed)
Denies pain. Reports on average during the night he gets up every two hours to void thus he gets up 3-4 times during the night. Reports this up and down during the night causes mild fatigue. Reports medium to strong urine stream. Denies difficulty completely emptying his bladder. Denies diarrhea. Denies burning with urination or hematuria.

## 2013-05-05 NOTE — Progress Notes (Signed)
Weekly Management Note:  Site: Prostate Current Dose:  3120  cGy Projected Dose: 7800  cGy  Narrative: The patient is seen today for routine under treatment assessment. CBCT/MVCT images/port films were reviewed. The chart was reviewed.   Bladder filling is satisfactory. He does have nocturia x3-4. No GI difficulties.  Physical Examination:  Filed Vitals:   05/05/13 0837  BP: 112/62  Pulse: 78  Resp: 18  .  Weight: 158 lb 12.8 oz (72.031 kg). No change .  Impression: Tolerating radiation therapy well.  Plan: Continue radiation therapy as planned.

## 2013-05-06 ENCOUNTER — Ambulatory Visit: Payer: Medicare Other

## 2013-05-06 ENCOUNTER — Ambulatory Visit
Admission: RE | Admit: 2013-05-06 | Discharge: 2013-05-06 | Disposition: A | Discharge status: Home / Self Care | Payer: Medicare Other | Source: Ambulatory Visit | Attending: Radiation Oncology | Admitting: Radiation Oncology

## 2013-05-06 DIAGNOSIS — C61 Malignant neoplasm of prostate: Secondary | ICD-10-CM | POA: Diagnosis not present

## 2013-05-06 DIAGNOSIS — Z510 Encounter for antineoplastic radiation therapy: Secondary | ICD-10-CM | POA: Diagnosis not present

## 2013-05-06 DIAGNOSIS — R351 Nocturia: Secondary | ICD-10-CM | POA: Diagnosis not present

## 2013-05-06 DIAGNOSIS — R350 Frequency of micturition: Secondary | ICD-10-CM | POA: Diagnosis not present

## 2013-05-07 ENCOUNTER — Ambulatory Visit
Admission: RE | Admit: 2013-05-07 | Discharge: 2013-05-07 | Disposition: A | Discharge status: Home / Self Care | Payer: Medicare Other | Source: Ambulatory Visit | Attending: Radiation Oncology | Admitting: Radiation Oncology

## 2013-05-07 ENCOUNTER — Ambulatory Visit: Payer: Medicare Other

## 2013-05-07 DIAGNOSIS — Z510 Encounter for antineoplastic radiation therapy: Secondary | ICD-10-CM | POA: Diagnosis not present

## 2013-05-07 DIAGNOSIS — C61 Malignant neoplasm of prostate: Secondary | ICD-10-CM | POA: Diagnosis not present

## 2013-05-07 DIAGNOSIS — R350 Frequency of micturition: Secondary | ICD-10-CM | POA: Diagnosis not present

## 2013-05-07 DIAGNOSIS — R351 Nocturia: Secondary | ICD-10-CM | POA: Diagnosis not present

## 2013-05-08 ENCOUNTER — Ambulatory Visit
Admission: RE | Admit: 2013-05-08 | Discharge: 2013-05-08 | Disposition: A | Discharge status: Home / Self Care | Payer: Medicare Other | Source: Ambulatory Visit | Attending: Radiation Oncology | Admitting: Radiation Oncology

## 2013-05-08 ENCOUNTER — Ambulatory Visit: Payer: Medicare Other

## 2013-05-08 DIAGNOSIS — C61 Malignant neoplasm of prostate: Secondary | ICD-10-CM | POA: Diagnosis not present

## 2013-05-08 DIAGNOSIS — R351 Nocturia: Secondary | ICD-10-CM | POA: Diagnosis not present

## 2013-05-08 DIAGNOSIS — Z510 Encounter for antineoplastic radiation therapy: Secondary | ICD-10-CM | POA: Diagnosis not present

## 2013-05-08 DIAGNOSIS — R350 Frequency of micturition: Secondary | ICD-10-CM | POA: Diagnosis not present

## 2013-05-09 ENCOUNTER — Ambulatory Visit
Admission: RE | Admit: 2013-05-09 | Discharge: 2013-05-09 | Disposition: A | Discharge status: Home / Self Care | Payer: Medicare Other | Source: Ambulatory Visit | Attending: Radiation Oncology | Admitting: Radiation Oncology

## 2013-05-09 ENCOUNTER — Ambulatory Visit: Payer: Medicare Other

## 2013-05-09 DIAGNOSIS — R350 Frequency of micturition: Secondary | ICD-10-CM | POA: Diagnosis not present

## 2013-05-09 DIAGNOSIS — C61 Malignant neoplasm of prostate: Secondary | ICD-10-CM | POA: Diagnosis not present

## 2013-05-09 DIAGNOSIS — R351 Nocturia: Secondary | ICD-10-CM | POA: Diagnosis not present

## 2013-05-09 DIAGNOSIS — Z510 Encounter for antineoplastic radiation therapy: Secondary | ICD-10-CM | POA: Diagnosis not present

## 2013-05-12 ENCOUNTER — Ambulatory Visit: Payer: Medicare Other

## 2013-05-12 ENCOUNTER — Ambulatory Visit
Admission: RE | Admit: 2013-05-12 | Discharge: 2013-05-12 | Disposition: A | Discharge status: Home / Self Care | Payer: Medicare Other | Source: Ambulatory Visit | Attending: Radiation Oncology | Admitting: Radiation Oncology

## 2013-05-12 ENCOUNTER — Encounter: Payer: Self-pay | Admitting: Radiation Oncology

## 2013-05-12 VITALS — BP 139/82 | HR 77 | Temp 97.7°F | Resp 20 | Wt 158.4 lb

## 2013-05-12 DIAGNOSIS — C61 Malignant neoplasm of prostate: Secondary | ICD-10-CM | POA: Diagnosis not present

## 2013-05-12 DIAGNOSIS — Z510 Encounter for antineoplastic radiation therapy: Secondary | ICD-10-CM | POA: Diagnosis not present

## 2013-05-12 DIAGNOSIS — R350 Frequency of micturition: Secondary | ICD-10-CM | POA: Diagnosis not present

## 2013-05-12 DIAGNOSIS — R351 Nocturia: Secondary | ICD-10-CM | POA: Diagnosis not present

## 2013-05-12 NOTE — Progress Notes (Signed)
   Weekly Management Note:  outpatient Current Dose:  40.95 Gy  Projected Dose: 78 Gy   Narrative:  The patient presents for routine under treatment assessment.  CBCT/MVCT images/Port film x-rays were reviewed.  The chart was checked. Stable Nocturia 3-4 times nightly. Tired.  Physical Findings:  weight is 158 lb 6.4 oz (71.85 kg). His oral temperature is 97.7 F (36.5 C). His blood pressure is 139/82 and his pulse is 77. His respiration is 20.  NAD - well appearing  Impression:  The patient is tolerating radiotherapy.  Plan:  Continue radiotherapy as planned. Offered Flomax - but he does not want it quite yet. Will reevaluate with Dr. Dayton Scrape next week.  ________________________________   Lonie Peak, M.D.

## 2013-05-12 NOTE — Progress Notes (Addendum)
Pt denies urinary/bowel issues except nocturia x 3-4, and strength of stream varies. No loss of appetite, but reports fatigue.

## 2013-05-13 ENCOUNTER — Ambulatory Visit: Payer: Medicare Other

## 2013-05-13 ENCOUNTER — Ambulatory Visit
Admission: RE | Admit: 2013-05-13 | Discharge: 2013-05-13 | Disposition: A | Discharge status: Home / Self Care | Payer: Medicare Other | Source: Ambulatory Visit | Attending: Radiation Oncology | Admitting: Radiation Oncology

## 2013-05-13 DIAGNOSIS — C61 Malignant neoplasm of prostate: Secondary | ICD-10-CM | POA: Diagnosis not present

## 2013-05-13 DIAGNOSIS — R350 Frequency of micturition: Secondary | ICD-10-CM | POA: Diagnosis not present

## 2013-05-13 DIAGNOSIS — R351 Nocturia: Secondary | ICD-10-CM | POA: Diagnosis not present

## 2013-05-13 DIAGNOSIS — Z510 Encounter for antineoplastic radiation therapy: Secondary | ICD-10-CM | POA: Diagnosis not present

## 2013-05-14 ENCOUNTER — Ambulatory Visit: Payer: Medicare Other

## 2013-05-14 ENCOUNTER — Ambulatory Visit
Admission: RE | Admit: 2013-05-14 | Discharge: 2013-05-14 | Disposition: A | Discharge status: Home / Self Care | Payer: Medicare Other | Source: Ambulatory Visit | Attending: Radiation Oncology | Admitting: Radiation Oncology

## 2013-05-14 DIAGNOSIS — R350 Frequency of micturition: Secondary | ICD-10-CM | POA: Diagnosis not present

## 2013-05-14 DIAGNOSIS — Z510 Encounter for antineoplastic radiation therapy: Secondary | ICD-10-CM | POA: Diagnosis not present

## 2013-05-14 DIAGNOSIS — C61 Malignant neoplasm of prostate: Secondary | ICD-10-CM | POA: Diagnosis not present

## 2013-05-14 DIAGNOSIS — R351 Nocturia: Secondary | ICD-10-CM | POA: Diagnosis not present

## 2013-05-15 ENCOUNTER — Ambulatory Visit: Payer: Medicare Other

## 2013-05-15 ENCOUNTER — Ambulatory Visit
Admission: RE | Admit: 2013-05-15 | Discharge: 2013-05-15 | Disposition: A | Discharge status: Home / Self Care | Payer: Medicare Other | Source: Ambulatory Visit | Attending: Radiation Oncology | Admitting: Radiation Oncology

## 2013-05-15 DIAGNOSIS — R350 Frequency of micturition: Secondary | ICD-10-CM | POA: Diagnosis not present

## 2013-05-15 DIAGNOSIS — Z510 Encounter for antineoplastic radiation therapy: Secondary | ICD-10-CM | POA: Diagnosis not present

## 2013-05-15 DIAGNOSIS — R351 Nocturia: Secondary | ICD-10-CM | POA: Diagnosis not present

## 2013-05-15 DIAGNOSIS — C61 Malignant neoplasm of prostate: Secondary | ICD-10-CM | POA: Diagnosis not present

## 2013-05-19 ENCOUNTER — Ambulatory Visit: Payer: Medicare Other

## 2013-05-19 ENCOUNTER — Encounter: Payer: Self-pay | Admitting: Radiation Oncology

## 2013-05-19 ENCOUNTER — Ambulatory Visit
Admission: RE | Admit: 2013-05-19 | Discharge: 2013-05-19 | Disposition: A | Discharge status: Home / Self Care | Payer: Medicare Other | Source: Ambulatory Visit | Attending: Radiation Oncology | Admitting: Radiation Oncology

## 2013-05-19 VITALS — BP 128/76 | HR 73 | Resp 18 | Wt 158.0 lb

## 2013-05-19 DIAGNOSIS — C61 Malignant neoplasm of prostate: Secondary | ICD-10-CM

## 2013-05-19 DIAGNOSIS — R351 Nocturia: Secondary | ICD-10-CM | POA: Diagnosis not present

## 2013-05-19 DIAGNOSIS — Z510 Encounter for antineoplastic radiation therapy: Secondary | ICD-10-CM | POA: Diagnosis not present

## 2013-05-19 DIAGNOSIS — R350 Frequency of micturition: Secondary | ICD-10-CM | POA: Diagnosis not present

## 2013-05-19 MED ORDER — TAMSULOSIN HCL 0.4 MG PO CAPS: 0.4000 mg | ORAL_CAPSULE | Freq: Every day | ORAL | Status: DC

## 2013-05-19 NOTE — Progress Notes (Signed)
Weekly Management Note:  Site: Prostate Current Dose:  4875  cGy Projected Dose: 7800  cGy  Narrative: The patient is seen today for routine under treatment assessment. CBCT/MVCT images/port films were reviewed. The chart was reviewed.   Bladder filling is excellent. He is having more urinary frequency and nocturia along with intermittent fatigue. His force of stream is weak to strong. His also having more frequent bowel movements.  Physical Examination:  Filed Vitals:   05/19/13 0836  BP: 128/76  Pulse: 73  Resp: 18  .  Weight: 158 lb (71.668 kg). No change  Impression: Tolerating radiation therapy well. I will get him started on Flomax at 0.4 mg daily, dispense 30, 3 refills  Plan: Continue radiation therapy as planned.

## 2013-05-19 NOTE — Progress Notes (Signed)
Reports on average he gets up every two hours to void. Therefore, reports he gets up 4-5 times per night to void. Has questions about flomax. Denies burning with urination. Denies hematuria. Denies diarrhea. Denies blood in stool or pain associated with bowel movement. Reports increased frequency in bowel movements. Reports urine stream alternates between strong and weak.

## 2013-05-20 ENCOUNTER — Ambulatory Visit
Admission: RE | Admit: 2013-05-20 | Discharge: 2013-05-20 | Disposition: A | Discharge status: Home / Self Care | Payer: Medicare Other | Source: Ambulatory Visit | Attending: Radiation Oncology | Admitting: Radiation Oncology

## 2013-05-20 ENCOUNTER — Ambulatory Visit: Payer: Medicare Other

## 2013-05-20 DIAGNOSIS — R351 Nocturia: Secondary | ICD-10-CM | POA: Diagnosis not present

## 2013-05-20 DIAGNOSIS — C61 Malignant neoplasm of prostate: Secondary | ICD-10-CM | POA: Diagnosis not present

## 2013-05-20 DIAGNOSIS — Z510 Encounter for antineoplastic radiation therapy: Secondary | ICD-10-CM | POA: Diagnosis not present

## 2013-05-20 DIAGNOSIS — R350 Frequency of micturition: Secondary | ICD-10-CM | POA: Diagnosis not present

## 2013-05-21 ENCOUNTER — Ambulatory Visit
Admission: RE | Admit: 2013-05-21 | Discharge: 2013-05-21 | Disposition: A | Discharge status: Home / Self Care | Payer: Medicare Other | Source: Ambulatory Visit | Attending: Radiation Oncology | Admitting: Radiation Oncology

## 2013-05-21 ENCOUNTER — Ambulatory Visit: Payer: Medicare Other

## 2013-05-21 DIAGNOSIS — Z510 Encounter for antineoplastic radiation therapy: Secondary | ICD-10-CM | POA: Diagnosis not present

## 2013-05-21 DIAGNOSIS — R350 Frequency of micturition: Secondary | ICD-10-CM | POA: Diagnosis not present

## 2013-05-21 DIAGNOSIS — C61 Malignant neoplasm of prostate: Secondary | ICD-10-CM | POA: Diagnosis not present

## 2013-05-21 DIAGNOSIS — R351 Nocturia: Secondary | ICD-10-CM | POA: Diagnosis not present

## 2013-05-22 ENCOUNTER — Ambulatory Visit
Admission: RE | Admit: 2013-05-22 | Discharge: 2013-05-22 | Disposition: A | Discharge status: Home / Self Care | Payer: Medicare Other | Source: Ambulatory Visit | Attending: Radiation Oncology | Admitting: Radiation Oncology

## 2013-05-22 ENCOUNTER — Ambulatory Visit: Payer: Medicare Other

## 2013-05-22 DIAGNOSIS — R350 Frequency of micturition: Secondary | ICD-10-CM | POA: Diagnosis not present

## 2013-05-22 DIAGNOSIS — C61 Malignant neoplasm of prostate: Secondary | ICD-10-CM | POA: Diagnosis not present

## 2013-05-22 DIAGNOSIS — Z510 Encounter for antineoplastic radiation therapy: Secondary | ICD-10-CM | POA: Diagnosis not present

## 2013-05-22 DIAGNOSIS — R351 Nocturia: Secondary | ICD-10-CM | POA: Diagnosis not present

## 2013-05-23 ENCOUNTER — Ambulatory Visit: Payer: Medicare Other

## 2013-05-23 ENCOUNTER — Ambulatory Visit
Admission: RE | Admit: 2013-05-23 | Discharge: 2013-05-23 | Disposition: A | Discharge status: Home / Self Care | Payer: Medicare Other | Source: Ambulatory Visit | Attending: Radiation Oncology | Admitting: Radiation Oncology

## 2013-05-23 DIAGNOSIS — Z510 Encounter for antineoplastic radiation therapy: Secondary | ICD-10-CM | POA: Diagnosis not present

## 2013-05-23 DIAGNOSIS — R351 Nocturia: Secondary | ICD-10-CM | POA: Diagnosis not present

## 2013-05-23 DIAGNOSIS — C61 Malignant neoplasm of prostate: Secondary | ICD-10-CM | POA: Diagnosis not present

## 2013-05-23 DIAGNOSIS — R350 Frequency of micturition: Secondary | ICD-10-CM | POA: Diagnosis not present

## 2013-05-26 ENCOUNTER — Ambulatory Visit
Admission: RE | Admit: 2013-05-26 | Discharge: 2013-05-26 | Disposition: A | Discharge status: Home / Self Care | Payer: Medicare Other | Source: Ambulatory Visit | Attending: Radiation Oncology | Admitting: Radiation Oncology

## 2013-05-26 ENCOUNTER — Encounter: Payer: Self-pay | Admitting: Radiation Oncology

## 2013-05-26 ENCOUNTER — Ambulatory Visit: Payer: Medicare Other

## 2013-05-26 VITALS — BP 128/78 | HR 80 | Temp 97.7°F | Resp 20 | Wt 159.0 lb

## 2013-05-26 DIAGNOSIS — C61 Malignant neoplasm of prostate: Secondary | ICD-10-CM | POA: Diagnosis not present

## 2013-05-26 DIAGNOSIS — Z510 Encounter for antineoplastic radiation therapy: Secondary | ICD-10-CM | POA: Diagnosis not present

## 2013-05-26 DIAGNOSIS — R350 Frequency of micturition: Secondary | ICD-10-CM | POA: Diagnosis not present

## 2013-05-26 DIAGNOSIS — R351 Nocturia: Secondary | ICD-10-CM | POA: Diagnosis not present

## 2013-05-26 NOTE — Progress Notes (Signed)
Weekly Management Note:  Site: Prostate Current Dose:  5850  cGy Projected Dose: 7800  cGy  Narrative: The patient is seen today for routine under treatment assessment. CBCT/MVCT images/port films were reviewed. The chart was reviewed.   Images are not available for review at the time this dictation. He does not feel that Flomax is a significant benefit. He still has nocturia x2-3. Urinary frequency is quite acceptable.  Physical Examination:  Filed Vitals:   05/26/13 0853  BP: 128/78  Pulse: 80  Temp: 97.7 F (36.5 C)  Resp: 20  .  Weight: 159 lb (72.122 kg). No change.  Impression: Tolerating radiation therapy well. I told him that I want him to continue with his Flomax.  Plan: Continue radiation therapy as planned.

## 2013-05-26 NOTE — Progress Notes (Signed)
Weekly put prostate, 30/40 txs completed, takes flomax daily, states regular bowel movements , states no dysuria, has some frequency,no pain 8:52 AM

## 2013-05-27 ENCOUNTER — Ambulatory Visit: Payer: Medicare Other

## 2013-05-27 ENCOUNTER — Ambulatory Visit
Admission: RE | Admit: 2013-05-27 | Discharge: 2013-05-27 | Disposition: A | Discharge status: Home / Self Care | Payer: Medicare Other | Source: Ambulatory Visit | Attending: Radiation Oncology | Admitting: Radiation Oncology

## 2013-05-27 DIAGNOSIS — C61 Malignant neoplasm of prostate: Secondary | ICD-10-CM | POA: Diagnosis not present

## 2013-05-27 DIAGNOSIS — R351 Nocturia: Secondary | ICD-10-CM | POA: Diagnosis not present

## 2013-05-27 DIAGNOSIS — R350 Frequency of micturition: Secondary | ICD-10-CM | POA: Diagnosis not present

## 2013-05-27 DIAGNOSIS — Z510 Encounter for antineoplastic radiation therapy: Secondary | ICD-10-CM | POA: Diagnosis not present

## 2013-05-28 ENCOUNTER — Ambulatory Visit
Admission: RE | Admit: 2013-05-28 | Discharge: 2013-05-28 | Disposition: A | Discharge status: Home / Self Care | Payer: Medicare Other | Source: Ambulatory Visit | Attending: Radiation Oncology | Admitting: Radiation Oncology

## 2013-05-28 ENCOUNTER — Ambulatory Visit: Payer: Medicare Other

## 2013-05-28 DIAGNOSIS — R350 Frequency of micturition: Secondary | ICD-10-CM | POA: Diagnosis not present

## 2013-05-28 DIAGNOSIS — Z510 Encounter for antineoplastic radiation therapy: Secondary | ICD-10-CM | POA: Diagnosis not present

## 2013-05-28 DIAGNOSIS — C61 Malignant neoplasm of prostate: Secondary | ICD-10-CM | POA: Diagnosis not present

## 2013-05-28 DIAGNOSIS — R351 Nocturia: Secondary | ICD-10-CM | POA: Diagnosis not present

## 2013-05-29 ENCOUNTER — Ambulatory Visit
Admission: RE | Admit: 2013-05-29 | Discharge: 2013-05-29 | Disposition: A | Discharge status: Home / Self Care | Payer: Medicare Other | Source: Ambulatory Visit | Attending: Radiation Oncology | Admitting: Radiation Oncology

## 2013-05-29 ENCOUNTER — Ambulatory Visit: Payer: Medicare Other

## 2013-05-29 DIAGNOSIS — R351 Nocturia: Secondary | ICD-10-CM | POA: Diagnosis not present

## 2013-05-29 DIAGNOSIS — C61 Malignant neoplasm of prostate: Secondary | ICD-10-CM | POA: Diagnosis not present

## 2013-05-29 DIAGNOSIS — R350 Frequency of micturition: Secondary | ICD-10-CM | POA: Diagnosis not present

## 2013-05-29 DIAGNOSIS — Z510 Encounter for antineoplastic radiation therapy: Secondary | ICD-10-CM | POA: Diagnosis not present

## 2013-05-30 ENCOUNTER — Ambulatory Visit
Admission: RE | Admit: 2013-05-30 | Discharge: 2013-05-30 | Disposition: A | Discharge status: Home / Self Care | Payer: Medicare Other | Source: Ambulatory Visit | Attending: Radiation Oncology | Admitting: Radiation Oncology

## 2013-05-30 ENCOUNTER — Ambulatory Visit: Payer: Medicare Other

## 2013-05-30 DIAGNOSIS — R351 Nocturia: Secondary | ICD-10-CM | POA: Diagnosis not present

## 2013-05-30 DIAGNOSIS — Z510 Encounter for antineoplastic radiation therapy: Secondary | ICD-10-CM | POA: Diagnosis not present

## 2013-05-30 DIAGNOSIS — R350 Frequency of micturition: Secondary | ICD-10-CM | POA: Diagnosis not present

## 2013-05-30 DIAGNOSIS — C61 Malignant neoplasm of prostate: Secondary | ICD-10-CM | POA: Diagnosis not present

## 2013-06-02 ENCOUNTER — Encounter: Payer: Self-pay | Admitting: Radiation Oncology

## 2013-06-02 ENCOUNTER — Ambulatory Visit: Payer: Medicare Other

## 2013-06-02 ENCOUNTER — Ambulatory Visit
Admission: RE | Admit: 2013-06-02 | Discharge: 2013-06-02 | Disposition: A | Discharge status: Home / Self Care | Payer: Medicare Other | Source: Ambulatory Visit | Attending: Radiation Oncology | Admitting: Radiation Oncology

## 2013-06-02 VITALS — BP 134/77 | HR 69 | Temp 97.4°F | Resp 20 | Wt 157.1 lb

## 2013-06-02 DIAGNOSIS — Z510 Encounter for antineoplastic radiation therapy: Secondary | ICD-10-CM | POA: Diagnosis not present

## 2013-06-02 DIAGNOSIS — R350 Frequency of micturition: Secondary | ICD-10-CM | POA: Diagnosis not present

## 2013-06-02 DIAGNOSIS — R351 Nocturia: Secondary | ICD-10-CM | POA: Diagnosis not present

## 2013-06-02 DIAGNOSIS — C61 Malignant neoplasm of prostate: Secondary | ICD-10-CM | POA: Diagnosis not present

## 2013-06-02 NOTE — Progress Notes (Signed)
Pt reports urinary frequency in daytime, nocturia every 2 hours. He was taking Flomax nightly but states it made no difference, so he stopped taking it. Pt also reports urinary stream is occasionally somewhat weak, difficult to initiate. He denies dysuria. He has frequent soft bm's, denies diarrhea. Pt is fatigued but continues to work, travels at times. Pt denies loss of appetite.

## 2013-06-02 NOTE — Progress Notes (Signed)
   Weekly Management Note:  outpatient Current Dose:  68.25 Gy  Projected Dose: 78 Gy   Narrative:  The patient presents for routine under treatment assessment.  CBCT/MVCT images/Port film x-rays were reviewed.  The chart was checked. Doing well, no new complaints. Stopped flomax- was not helping per patient  Physical Findings:  weight is 157 lb 1.6 oz (71.26 kg). His oral temperature is 97.4 F (36.3 C). His blood pressure is 134/77 and his pulse is 69. His respiration is 20.  NAD  Impression:  The patient is tolerating radiotherapy.  Plan:  Continue radiotherapy as planned. Advised pt to cont. Flomax and discuss with Dr. Dayton Scrape before stopping (Dr Dayton Scrape wrote in past note that he wanted pt to stay on this drug)  ________________________________   Lonie Peak, M.D.

## 2013-06-03 ENCOUNTER — Telehealth: Payer: Self-pay | Admitting: *Deleted

## 2013-06-03 ENCOUNTER — Ambulatory Visit: Payer: Medicare Other

## 2013-06-03 ENCOUNTER — Encounter: Payer: Self-pay | Admitting: Radiation Oncology

## 2013-06-03 ENCOUNTER — Ambulatory Visit
Admission: RE | Admit: 2013-06-03 | Discharge: 2013-06-03 | Disposition: A | Discharge status: Home / Self Care | Payer: Medicare Other | Source: Ambulatory Visit | Attending: Radiation Oncology | Admitting: Radiation Oncology

## 2013-06-03 DIAGNOSIS — Z510 Encounter for antineoplastic radiation therapy: Secondary | ICD-10-CM | POA: Diagnosis not present

## 2013-06-03 DIAGNOSIS — R350 Frequency of micturition: Secondary | ICD-10-CM | POA: Diagnosis not present

## 2013-06-03 DIAGNOSIS — C61 Malignant neoplasm of prostate: Secondary | ICD-10-CM | POA: Diagnosis not present

## 2013-06-03 DIAGNOSIS — R351 Nocturia: Secondary | ICD-10-CM | POA: Diagnosis not present

## 2013-06-03 NOTE — Progress Notes (Signed)
Per nursing assessment today:  Patient came to nursing stating "last night when I got up to void I was Little dizzy,lightheaded, and sweating" nocturia 3x no fever, I feel for the first time in my life that I am 77 years old, depression is setting in and fatigue, " patient has had 36/40txs  And was seen yesterday by Dr.Jocelyn Lowery" took ortho statuic vitals, sitting b/p=108/75,p=80,rr=20,T=97.5  Standing b/p=106/68,p=93, no sweating, patient skin dry, asked if patient was eating well and drinking enough water?, "I could drink more water"informed him that Dr.Nathanael Krist was in with a consult and that she couldn't see him at present, he asked that she call him today about recent happenings last night", patient had steady gait when leaving, no c/o dizziness or lightheadness.   I called the patient back - he had resumed Flomax yesterday - I think this prompted his sx and recommended he stop this medication.  Re: his depressed mood - he said it was improving through the day. Overall, he says he just feels a bit down from his treatments and diagnosis. However he does have perspective on his prognosis and a healthy degree of hope. I told him to call if his depressed mood lingers, in which case we can connect him with counseling services. He appreciated my call and set that he would hold the Flomax as instructed.  -----------------------------------  Lonie Peak, MD

## 2013-06-03 NOTE — Telephone Encounter (Signed)
error 

## 2013-06-03 NOTE — Progress Notes (Signed)
Patient came to nursing stating "last night when I got up to void I was  Little dizzy,lightheaded, and sweating" nocturia 3x no fever, I feel for the first time in my life that I am 77 years old, depression is setting in and fatigue, " patient has had 36/40txs  And was seen yesterday by Dr.Squire" took ortho statuic vitals, sitting b/p=108/75,p=80,rr=20,T=97.5 Standing b/p=106/68,p=93, no sweating, patient skin dry, asked if patient was eating well and drinking enough water?, "I could drink more water"informed him that Dr.Squire was in with  A consult and that she couldn't see him at present, he asked that she call him today about recent happenings last night", patient had steady gait when leaving, no c/o dizziness or lightheadness 9:26 AM  .n

## 2013-06-04 ENCOUNTER — Ambulatory Visit
Admission: RE | Admit: 2013-06-04 | Discharge: 2013-06-04 | Disposition: A | Discharge status: Home / Self Care | Payer: Medicare Other | Source: Ambulatory Visit | Attending: Radiation Oncology | Admitting: Radiation Oncology

## 2013-06-04 ENCOUNTER — Ambulatory Visit: Payer: Medicare Other

## 2013-06-04 DIAGNOSIS — R350 Frequency of micturition: Secondary | ICD-10-CM | POA: Diagnosis not present

## 2013-06-04 DIAGNOSIS — C61 Malignant neoplasm of prostate: Secondary | ICD-10-CM | POA: Diagnosis not present

## 2013-06-04 DIAGNOSIS — R351 Nocturia: Secondary | ICD-10-CM | POA: Diagnosis not present

## 2013-06-04 DIAGNOSIS — Z510 Encounter for antineoplastic radiation therapy: Secondary | ICD-10-CM | POA: Diagnosis not present

## 2013-06-05 ENCOUNTER — Ambulatory Visit: Admission: RE | Admit: 2013-06-05 | Payer: Medicare Other | Source: Ambulatory Visit

## 2013-06-05 ENCOUNTER — Ambulatory Visit
Admission: RE | Admit: 2013-06-05 | Discharge: 2013-06-05 | Disposition: A | Discharge status: Home / Self Care | Payer: Medicare Other | Source: Ambulatory Visit | Attending: Radiation Oncology | Admitting: Radiation Oncology

## 2013-06-05 DIAGNOSIS — R351 Nocturia: Secondary | ICD-10-CM | POA: Diagnosis not present

## 2013-06-05 DIAGNOSIS — R350 Frequency of micturition: Secondary | ICD-10-CM | POA: Diagnosis not present

## 2013-06-05 DIAGNOSIS — C61 Malignant neoplasm of prostate: Secondary | ICD-10-CM | POA: Diagnosis not present

## 2013-06-05 DIAGNOSIS — Z510 Encounter for antineoplastic radiation therapy: Secondary | ICD-10-CM | POA: Diagnosis not present

## 2013-06-06 ENCOUNTER — Ambulatory Visit
Admission: RE | Admit: 2013-06-06 | Discharge: 2013-06-06 | Disposition: A | Discharge status: Home / Self Care | Payer: Medicare Other | Source: Ambulatory Visit | Attending: Radiation Oncology | Admitting: Radiation Oncology

## 2013-06-06 DIAGNOSIS — R350 Frequency of micturition: Secondary | ICD-10-CM | POA: Diagnosis not present

## 2013-06-06 DIAGNOSIS — C61 Malignant neoplasm of prostate: Secondary | ICD-10-CM | POA: Diagnosis not present

## 2013-06-06 DIAGNOSIS — Z510 Encounter for antineoplastic radiation therapy: Secondary | ICD-10-CM | POA: Diagnosis not present

## 2013-06-06 DIAGNOSIS — R351 Nocturia: Secondary | ICD-10-CM | POA: Diagnosis not present

## 2013-06-09 ENCOUNTER — Ambulatory Visit
Admission: RE | Admit: 2013-06-09 | Discharge: 2013-06-09 | Disposition: A | Discharge status: Home / Self Care | Payer: Medicare Other | Source: Ambulatory Visit | Attending: Radiation Oncology | Admitting: Radiation Oncology

## 2013-06-09 ENCOUNTER — Encounter: Payer: Self-pay | Admitting: Radiation Oncology

## 2013-06-09 VITALS — BP 144/85 | HR 76 | Temp 97.4°F | Resp 20 | Wt 157.3 lb

## 2013-06-09 DIAGNOSIS — R351 Nocturia: Secondary | ICD-10-CM | POA: Diagnosis not present

## 2013-06-09 DIAGNOSIS — C61 Malignant neoplasm of prostate: Secondary | ICD-10-CM

## 2013-06-09 DIAGNOSIS — R350 Frequency of micturition: Secondary | ICD-10-CM | POA: Diagnosis not present

## 2013-06-09 DIAGNOSIS — Z510 Encounter for antineoplastic radiation therapy: Secondary | ICD-10-CM | POA: Diagnosis not present

## 2013-06-09 NOTE — Progress Notes (Signed)
weekly rad tx prostate.,40/40 completed, no c/o dizziness, stream varies stated patient, stopped the flomax,wasn't helping, slept well last night, no diarrhea but more stools during the day, good stream in the day, weaker at night stated patient, no hematuria,, gave 1 month f/u appt card, patient will call and make appt  8:53 AM

## 2013-06-09 NOTE — Progress Notes (Signed)
[facility] Radiation Oncology End of Treatment Note  Name:Corey Oneill  Date: 06/09/2013 ZOX:[id number] DOB:[birth year]   Status:outpatient    CC: Carollee Herter, MD  Dr. Jerilee Field  REFERRING PHYSICIAN:   Dr. 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 He was placed on Flomax but this was discontinued since he did not find to be of much help. Of note is that his cone beam CT scan showed excellent bladder filling, and I suspect that he does not empty his bladder .                        PLAN: Routine followup in one month. Patient instructed to call if questions or worsening complaints in interim.

## 2013-06-09 NOTE — Progress Notes (Signed)
Weekly Management Note:  Site: Prostate Current Dose:  7800  cGy Projected Dose: 7800  cGy  Narrative: The patient is seen today for routine under treatment assessment. CBCT/MVCT images/port films were reviewed. The chart was reviewed.   No new GU or GI difficulties. Bladder filling is excellent. He discontinued his Flomax, stating that it was not of much benefit.  Physical Examination:  Filed Vitals:   06/09/13 0848  BP: 144/85  Pulse: 76  Temp: 97.4 F (36.3 C)  Resp: 20  .  Weight: 157 lb 4.8 oz (71.351 kg). No change.  Impression: Tolerating radiation therapy well.  Plan: Followup visit in one month. He'll see Dr. Mena Goes in October.

## 2013-06-20 DIAGNOSIS — H524 Presbyopia: Secondary | ICD-10-CM | POA: Diagnosis not present

## 2013-06-23 ENCOUNTER — Encounter: Payer: Self-pay | Admitting: Family Medicine

## 2013-06-23 ENCOUNTER — Ambulatory Visit (INDEPENDENT_AMBULATORY_CARE_PROVIDER_SITE_OTHER): Payer: Medicare Other | Admitting: Family Medicine

## 2013-06-23 DIAGNOSIS — C61 Malignant neoplasm of prostate: Secondary | ICD-10-CM | POA: Diagnosis not present

## 2013-06-23 DIAGNOSIS — Z860100 Personal history of colon polyps, unspecified: Secondary | ICD-10-CM

## 2013-06-23 DIAGNOSIS — E785 Hyperlipidemia, unspecified: Secondary | ICD-10-CM | POA: Diagnosis not present

## 2013-06-23 DIAGNOSIS — R634 Abnormal weight loss: Secondary | ICD-10-CM

## 2013-06-23 DIAGNOSIS — Z79899 Other long term (current) drug therapy: Secondary | ICD-10-CM | POA: Diagnosis not present

## 2013-06-23 LAB — COMPREHENSIVE METABOLIC PANEL
BUN: 15 mg/dL (ref 6–23)
Chloride: 102 mEq/L (ref 96–112)
Glucose, Bld: 77 mg/dL (ref 70–99)
Potassium: 4.2 mEq/L (ref 3.5–5.3)
Sodium: 138 mEq/L (ref 135–145)
Total Bilirubin: 0.6 mg/dL (ref 0.3–1.2)

## 2013-06-23 LAB — CBC WITH DIFFERENTIAL/PLATELET
Basophils Absolute: 0.1 10*3/uL (ref 0.0–0.1)
Eosinophils Absolute: 0.1 10*3/uL (ref 0.0–0.7)
Eosinophils Relative: 2 % (ref 0–5)
HCT: 39.4 % (ref 39.0–52.0)
Lymphocytes Relative: 23 % (ref 12–46)
Lymphs Abs: 1 10*3/uL (ref 0.7–4.0)
MCV: 92.9 fL (ref 78.0–100.0)
Monocytes Absolute: 0.5 10*3/uL (ref 0.1–1.0)
Monocytes Relative: 12 % (ref 3–12)
Neutrophils Relative %: 62 % (ref 43–77)
RBC: 4.24 MIL/uL (ref 4.22–5.81)
RDW: 13.8 % (ref 11.5–15.5)
WBC: 4.3 10*3/uL (ref 4.0–10.5)

## 2013-06-23 LAB — LIPID PANEL
Cholesterol: 212 mg/dL — ABNORMAL HIGH (ref 0–200)
LDL Cholesterol: 135 mg/dL — ABNORMAL HIGH (ref 0–99)
VLDL: 15 mg/dL (ref 0–40)

## 2013-06-23 NOTE — Progress Notes (Signed)
  Subjective:    Patient ID: Corey Oneill, male    DOB: May 23, 1936, 77 y.o.   MRN: [id number]  HPI He is here for evaluation of weight loss. He recently finished radiation for prostate cancer and has had difficulty with fatigue and anorexia.He is on Flomax which has helped with bladder function. Colonoscopy in 2013. Meds were reviewed. No nausea,vomiting,fever chills abdominal pain or diarrhea. He continues on meds listed in the chart  Review of Systems     Objective:   Physical Exam alert and in no distress. Tympanic membranes and canals are normal. Throat is clear. Tonsils are normal. Neck is supple without adenopathy or thyromegaly. Cardiac exam shows a regular sinus rhythm without murmurs or gallops. Lungs are clear to auscultation. Abdominal exam shows no H/s megaly masses or tenderness       Assessment & Plan:  Prostate cancer  Loss of weight - Plan: CBC with Differential, Comprehensive metabolic panel  HYPERLIPIDEMIA - Plan: Lipid panel  COLONIC POLYPS, HX OF  Encounter for long-term (current) use of other medications - Plan: CBC with Differential, Comprehensive metabolic panel, Lipid panel

## 2013-06-26 DIAGNOSIS — IMO0002 Reserved for concepts with insufficient information to code with codable children: Secondary | ICD-10-CM | POA: Diagnosis not present

## 2013-06-26 DIAGNOSIS — H2510 Age-related nuclear cataract, unspecified eye: Secondary | ICD-10-CM | POA: Diagnosis not present

## 2013-07-17 ENCOUNTER — Telehealth: Payer: Self-pay | Admitting: *Deleted

## 2013-07-17 NOTE — Telephone Encounter (Signed)
OK with me. Please call patient. RM

## 2013-07-17 NOTE — Telephone Encounter (Signed)
Called patient home, okay to donate blood again per Dr.Murray, patient stated"Thankyou and to thank MD  2:58 PM

## 2013-07-17 NOTE — Telephone Encounter (Signed)
Patient called [phone] asking he completed radiation to his Prostate 05/2013, [facility] called [facility] and they said he could start donating  blood again, his last time donating blood wa back in February, he just wants clearance yes or know from Dr.Murray, I will forward this question to MD and will call patient back  With an answer later today. 11:44 AM

## 2013-07-22 ENCOUNTER — Encounter: Payer: Self-pay | Admitting: Radiation Oncology

## 2013-07-28 DIAGNOSIS — L259 Unspecified contact dermatitis, unspecified cause: Secondary | ICD-10-CM | POA: Diagnosis not present

## 2013-07-29 ENCOUNTER — Ambulatory Visit
Admission: RE | Admit: 2013-07-29 | Discharge: 2013-07-29 | Disposition: A | Discharge status: Home / Self Care | Payer: Medicare Other | Source: Ambulatory Visit | Attending: Radiation Oncology | Admitting: Radiation Oncology

## 2013-07-29 ENCOUNTER — Encounter: Payer: Self-pay | Admitting: Radiation Oncology

## 2013-07-29 VITALS — BP 139/88 | HR 71 | Temp 98.3°F | Resp 20 | Wt 159.0 lb

## 2013-07-29 DIAGNOSIS — C61 Malignant neoplasm of prostate: Secondary | ICD-10-CM

## 2013-07-29 NOTE — Progress Notes (Signed)
Pt denies pain, loss of appetite, fatigue, bowel issues, bladder issues. He states he was taking Flomax and getting up often to urinate at night. He states he did not take Flomax the past 2 nights, and he slept better.

## 2013-07-29 NOTE — Progress Notes (Signed)
CC: Dr. Jerilee Field  Followup note:  Mr. 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 He is doing well from a GU and GI standpoint. He is off Flomax. He does have nocturia x1. He still has some residual fatigue and remains busy working full time. He'll see Dr. Mena Goes for a followup visit on October 6.  Physical examination: Alert and oriented. Filed Vitals:   07/29/13 1048  BP: 139/88  Pulse: 71  Temp: 98.3 F (36.8 C)  Resp: 20   Rectal examination not performed today.  Impression: Satisfactory progress.  Plan: He'll see Dr. Mena Goes for a followup visit on October 6. I've not scheduled the patient for a formal followup visit and I ask that Dr. Mena Goes keep me posted on his progress.

## 2013-08-18 DIAGNOSIS — C61 Malignant neoplasm of prostate: Secondary | ICD-10-CM | POA: Diagnosis not present

## 2013-08-25 DIAGNOSIS — C61 Malignant neoplasm of prostate: Secondary | ICD-10-CM | POA: Diagnosis not present

## 2013-09-02 ENCOUNTER — Telehealth: Payer: Self-pay | Admitting: Family Medicine

## 2013-09-02 NOTE — Telephone Encounter (Signed)
Phone call from Laureen Ochs was reviewed. I called and left a message for him to set up an appointment to be seen by me at the beginning of the week.

## 2013-09-02 NOTE — Telephone Encounter (Signed)
Pt called and states he is at the [place] airport going to a trade show in [place].  And he wants to be referred to a psychiatrist .  I asked him why and he said his wife thinks he is depressed and he tends to agree with her and he started to cry.  He said he knew something was going on in his head, and he said he knew I was going to ask him if he was suicidal and he said he is not suicidal.  He states he is taking radiation and he doesn't  intentionally think about the cancer but it is in the back of his mind.  He is leaving from the trade show to see his sister in [place] whose health is failing.    I explained to him to seek help at the urgent care or hospital if he needed while traveling.  There were a couple different times he began to cry.  I explained he needs to see you as soon as he gets back.    Please call patient at [phone]

## 2013-09-16 ENCOUNTER — Telehealth: Payer: Self-pay | Admitting: Family Medicine

## 2013-09-16 NOTE — Telephone Encounter (Signed)
Reinforce with him the need for him to set up an appointment with me

## 2013-09-16 NOTE — Telephone Encounter (Signed)
Pt called and left message on my voice mail that he was some better and still traveling.  Please call pt [phone]

## 2013-09-22 ENCOUNTER — Telehealth: Payer: Self-pay | Admitting: Family Medicine

## 2013-09-22 NOTE — Telephone Encounter (Signed)
Called pt and set up appt for Monday

## 2013-09-22 NOTE — Telephone Encounter (Signed)
Called him this morning and set up appt.

## 2013-09-29 ENCOUNTER — Ambulatory Visit (INDEPENDENT_AMBULATORY_CARE_PROVIDER_SITE_OTHER): Payer: Medicare Other | Admitting: Family Medicine

## 2013-09-29 ENCOUNTER — Encounter: Payer: Self-pay | Admitting: Family Medicine

## 2013-09-29 DIAGNOSIS — F329 Major depressive disorder, single episode, unspecified: Secondary | ICD-10-CM

## 2013-09-29 DIAGNOSIS — F3289 Other specified depressive episodes: Secondary | ICD-10-CM

## 2013-09-29 DIAGNOSIS — C61 Malignant neoplasm of prostate: Secondary | ICD-10-CM

## 2013-09-29 NOTE — Progress Notes (Signed)
  Subjective:    Patient ID: Corey Oneill, male    DOB: August 11, 1936, 77 y.o.   MRN: [id number]  HPI He is here for consultation. He recently finished radiation for his underlying prostate cancer. His last PSA was much lower. He feels that he has a good handle on the cancer and is not overly concerned about it at this time. He also is had a lot of difficulty dealing with being unable to let go of his past indiscretions. He has been married twice and did have some extramarital affairs which he is still paying for economically. He continues to work to help cover his expenses. He does enjoy it however. Recently he has noted increased difficulty with anhedonia as well as memory issues and feeling depressed. He is not suicidal. He has been in counseling but only made one visit.   Review of Systems     Objective:   Physical Exam Alert and in no distress with a flat affect and appropriately dressed       Assessment & Plan:  Depressive disorder, not elsewhere classified  Prostate cancer  discussed treatment of this depression. Mention using medications and he would like to avoid this. Encouraged him to get involved in counseling to help deal with why he can't let go of his past indiscretions. He will get back into counseling. I did leave the door open for him to return for possible medication use.

## 2013-09-29 NOTE — Patient Instructions (Signed)
Audley Hose [phone]

## 2013-10-02 ENCOUNTER — Telehealth: Payer: Self-pay | Admitting: Family Medicine

## 2013-10-02 MED ORDER — OMEPRAZOLE 20 MG PO CPDR: 20.0000 mg | DELAYED_RELEASE_CAPSULE | Freq: Every day | ORAL | Status: DC

## 2013-10-02 NOTE — Telephone Encounter (Signed)
Pt called and states he is on 20 mg Omeprazole and needs it called to [facility].  Also pt has appt tomorrow afternoon with Geoffry Paradise.

## 2013-11-25 DIAGNOSIS — H1030 Unspecified acute conjunctivitis, unspecified eye: Secondary | ICD-10-CM | POA: Diagnosis not present

## 2013-12-16 DIAGNOSIS — C61 Malignant neoplasm of prostate: Secondary | ICD-10-CM | POA: Diagnosis not present

## 2013-12-18 ENCOUNTER — Telehealth: Payer: Self-pay | Admitting: Family Medicine

## 2013-12-19 MED ORDER — SIMVASTATIN 40 MG PO TABS: 40.0000 mg | ORAL_TABLET | Freq: Every day | ORAL | Status: DC

## 2013-12-19 NOTE — Telephone Encounter (Signed)
done

## 2013-12-22 DIAGNOSIS — C61 Malignant neoplasm of prostate: Secondary | ICD-10-CM | POA: Diagnosis not present

## 2014-02-27 ENCOUNTER — Encounter: Payer: Self-pay | Admitting: Family Medicine

## 2014-02-27 ENCOUNTER — Ambulatory Visit (INDEPENDENT_AMBULATORY_CARE_PROVIDER_SITE_OTHER): Payer: Medicare Other | Admitting: Family Medicine

## 2014-02-27 ENCOUNTER — Telehealth: Payer: Self-pay | Admitting: Family Medicine

## 2014-02-27 VITALS — BP 100/70 | HR 80 | Ht 72.0 in | Wt 150.0 lb

## 2014-02-27 DIAGNOSIS — I499 Cardiac arrhythmia, unspecified: Secondary | ICD-10-CM

## 2014-02-27 DIAGNOSIS — Z860100 Personal history of colon polyps, unspecified: Secondary | ICD-10-CM

## 2014-02-27 DIAGNOSIS — Z0000 Encounter for general adult medical examination without abnormal findings: Secondary | ICD-10-CM

## 2014-02-27 DIAGNOSIS — E785 Hyperlipidemia, unspecified: Secondary | ICD-10-CM

## 2014-02-27 DIAGNOSIS — K219 Gastro-esophageal reflux disease without esophagitis: Secondary | ICD-10-CM | POA: Diagnosis not present

## 2014-02-27 DIAGNOSIS — R634 Abnormal weight loss: Secondary | ICD-10-CM

## 2014-02-27 DIAGNOSIS — C61 Malignant neoplasm of prostate: Secondary | ICD-10-CM | POA: Diagnosis not present

## 2014-02-27 DIAGNOSIS — Z87448 Personal history of other diseases of urinary system: Secondary | ICD-10-CM

## 2014-02-27 LAB — POCT URINALYSIS DIPSTICK
Bilirubin, UA: NEGATIVE
Glucose, UA: NEGATIVE
Ketones, UA: NEGATIVE
LEUKOCYTES UA: NEGATIVE
NITRITE UA: NEGATIVE
PROTEIN UA: NEGATIVE
RBC UA: NEGATIVE
Spec Grav, UA: <=1.005
UROBILINOGEN UA: NEGATIVE
pH, UA: 6

## 2014-02-27 MED ORDER — SIMVASTATIN 40 MG PO TABS: 40.0000 mg | ORAL_TABLET | Freq: Every day | ORAL | Status: DC

## 2014-02-27 MED ORDER — OMEPRAZOLE 20 MG PO CPDR: 20.0000 mg | DELAYED_RELEASE_CAPSULE | Freq: Every day | ORAL | Status: DC

## 2014-02-27 NOTE — Telephone Encounter (Signed)
Nothing to worry about.

## 2014-02-27 NOTE — Progress Notes (Signed)
   Subjective:    Patient ID: Corey Oneill, male    DOB: 04/21/1936, 78 y.o.   MRN: [id number]  HPI He is here for complete examination. He has an underlying history of prostate cancer with radiation. His last PSA was 0.33. He continues to deal with psychological issues with his marriage, continuing to work and within the last year with prostate cancer. He uses Prilosec on an every other day basis and this controls his reflux symptoms. He does complain of weight loss over the last several months but no nausea, vomiting, diarrhea. He does question whether he has early satiety. He did have a colonoscopy in 2013. He continues on Zocor and is having no difficulty with this. He does have a history of ED.   Review of Systems  All other systems reviewed and are negative.      Objective:   Physical Exam BP 100/70  Pulse 80  Ht 6' (1.829 m)  Wt 150 lb (68.04 kg)  BMI 20.34 kg/m2  General Appearance:    Alert, cooperative, no distress, appears stated age  Head:    Normocephalic, without obvious abnormality, atraumatic  Eyes:    PERRL, conjunctiva/corneas clear, EOM's intact,   Ears:    Normal TM's and external ear canals  Nose:   Nares normal, mucosa normal, no drainage or sinus   tenderness  Throat:   Lips, mucosa, and tongue normal; teeth and gums normal  Neck:   Supple, no lymphadenopathy;  thyroid:  no   enlargement/tenderness/nodules; no carotid   bruit or JVD  Back:    Spine nontender, no curvature, ROM normal, no CVA     tenderness  Lungs:     Clear to auscultation bilaterally without wheezes, rales or     ronchi; respirations unlabored  Chest Wall:    No tenderness or deformity   Heart:    Regular rate and slightly irregular rhythm, S1 and S2 normal, no murmur, rub   or gallop  Breast Exam:    No chest wall tenderness, masses or gynecomastia  Abdomen:     Soft, non-tender, nondistended, normoactive bowel sounds,    no masses, no hepatosplenomegaly  Genitalia:  deferred  Rectal:   deferred  sExtremities:   No clubbing, cyanosis or edema  Pulses:   2+ and symmetric all extremities  Skin:   Skin color, texture, turgor normal, no rashes or lesions  Lymph nodes:   Cervical, supraclavicular, and axillary nodes normal  Neurologic:   CNII-XII intact, normal strength, sensation and gait; reflexes 2+ and symmetric throughout          Psych:   Normal mood, affect, hygiene and grooming.   EKG shows no acute changes       Assessment & Plan:  Routine general medical examination at a health care facility - Plan: Urinalysis Dipstick, CBC with Differential, Comprehensive metabolic panel, Lipid panel  Prostate cancer  GERD (gastroesophageal reflux disease) - Plan: omeprazole (PRILOSEC) 20 MG capsule  COLONIC POLYPS, HX OF  ERECTILE DYSFUNCTION, ORGANIC, HX OF  HYPERLIPIDEMIA - Plan: Lipid panel, simvastatin (ZOCOR) 40 MG tablet  Loss of weight - Plan: CBC with Differential, Comprehensive metabolic panel  Arrhythmia - Plan: EKG 12-Lead  if his blood work come back normal. I will discuss referring to GI for further evaluation of weight loss.

## 2014-02-27 NOTE — Telephone Encounter (Signed)
Left message on pt VM 

## 2014-02-28 LAB — COMPREHENSIVE METABOLIC PANEL
ALBUMIN: 4.1 g/dL (ref 3.5–5.2)
ALT: 12 U/L (ref 0–53)
AST: 20 U/L (ref 0–37)
Alkaline Phosphatase: 65 U/L (ref 39–117)
BUN: 18 mg/dL (ref 6–23)
CHLORIDE: 102 meq/L (ref 96–112)
CO2: 29 mEq/L (ref 19–32)
CREATININE: 0.87 mg/dL (ref 0.50–1.35)
Calcium: 8.8 mg/dL (ref 8.4–10.5)
GLUCOSE: 82 mg/dL (ref 70–99)
POTASSIUM: 4.3 meq/L (ref 3.5–5.3)
SODIUM: 140 meq/L (ref 135–145)
Total Bilirubin: 0.7 mg/dL (ref 0.2–1.2)
Total Protein: 6.9 g/dL (ref 6.0–8.3)

## 2014-02-28 LAB — LIPID PANEL
CHOL/HDL RATIO: 3.1 ratio
Cholesterol: 170 mg/dL (ref 0–200)
HDL: 54 mg/dL (ref >39)
LDL CALC: 107 mg/dL — AB (ref 0–99)
Triglycerides: 43 mg/dL (ref <150)
VLDL: 9 mg/dL (ref 0–40)

## 2014-02-28 LAB — CBC WITH DIFFERENTIAL/PLATELET
Basophils Absolute: 0.1 10*3/uL (ref 0.0–0.1)
Basophils Relative: 1 % (ref 0–1)
Eosinophils Absolute: 0.1 10*3/uL (ref 0.0–0.7)
Eosinophils Relative: 1 % (ref 0–5)
HEMATOCRIT: 40.2 % (ref 39.0–52.0)
Hemoglobin: 13.6 g/dL (ref 13.0–17.0)
LYMPHS PCT: 14 % (ref 12–46)
Lymphs Abs: 1.1 10*3/uL (ref 0.7–4.0)
MCH: 31.6 pg (ref 26.0–34.0)
MCHC: 33.8 g/dL (ref 30.0–36.0)
MCV: 93.5 fL (ref 78.0–100.0)
Monocytes Absolute: 0.6 10*3/uL (ref 0.1–1.0)
Monocytes Relative: 8 % (ref 3–12)
Neutro Abs: 6 10*3/uL (ref 1.7–7.7)
Neutrophils Relative %: 76 % (ref 43–77)
Platelets: 271 10*3/uL (ref 150–400)
RBC: 4.3 MIL/uL (ref 4.22–5.81)
RDW: 13.5 % (ref 11.5–15.5)
WBC: 7.9 10*3/uL (ref 4.0–10.5)

## 2014-03-02 NOTE — Addendum Note (Signed)
Addended by: [name] on: 03/02/2014 01:03 PM   Modules accepted: Level of Service

## 2014-03-23 DIAGNOSIS — K6289 Other specified diseases of anus and rectum: Secondary | ICD-10-CM | POA: Diagnosis not present

## 2014-05-25 ENCOUNTER — Other Ambulatory Visit: Payer: Self-pay | Admitting: Family Medicine

## 2014-06-03 DIAGNOSIS — F4323 Adjustment disorder with mixed anxiety and depressed mood: Secondary | ICD-10-CM | POA: Diagnosis not present

## 2014-06-25 ENCOUNTER — Ambulatory Visit (INDEPENDENT_AMBULATORY_CARE_PROVIDER_SITE_OTHER): Payer: Medicare Other | Admitting: Podiatry

## 2014-06-25 ENCOUNTER — Encounter: Payer: Self-pay | Admitting: Podiatry

## 2014-06-25 VITALS — BP 131/74 | HR 80 | Resp 14 | Ht 72.0 in | Wt 155.0 lb

## 2014-06-25 DIAGNOSIS — M779 Enthesopathy, unspecified: Secondary | ICD-10-CM

## 2014-06-25 DIAGNOSIS — M2040 Other hammer toe(s) (acquired), unspecified foot: Secondary | ICD-10-CM | POA: Diagnosis not present

## 2014-06-25 DIAGNOSIS — L84 Corns and callosities: Secondary | ICD-10-CM

## 2014-06-25 MED ORDER — TRIAMCINOLONE ACETONIDE 10 MG/ML IJ SUSP
10.0000 mg | Freq: Once | INTRAMUSCULAR | Status: AC
  Administered 2014-06-25: 10 mg

## 2014-06-25 NOTE — Progress Notes (Signed)
Subjective:     Patient ID: Corey Oneill, male   DOB: 08/04/1936, 78 y.o.   MRN: [id number]  HPI patient presents stating the second toe on my left foot has been bothering me and slightly on the second toe right especially since I bought a new. Tight shoes and the toes are very contracted   Review of Systems  All other systems reviewed and are negative.      Objective:   Physical Exam  Nursing note and vitals reviewed. Constitutional: He is oriented to person, place, and time.  Cardiovascular: Intact distal pulses.   Musculoskeletal: Normal range of motion.  Neurological: He is oriented to person, place, and time.  Skin: Skin is warm.   neurovascular status intact with muscle strength adequate and range of motion subtalar midtarsal joint within normal limits. Patient is found to have significant rigid digital contracture digits to both feet with inflammation and fluid had a proximal phalanx and redness and fluid around the head of the proximal phalanx second toe left with keratotic lesion formation that's painful. Digits are found to be well perfused     Assessment:     Consistent chronic hammertoe deformity second both feet with inflammatory capsulitis of the second toe left foot with keratotic lesion    Plan:     H&P reviewed and today I did a careful interphalangeal injection of the dorsal capsule proximal phalanx second toe left and then debrided the lesion fully and applied pad with silicone gel. if symptoms persist we'll consider fusion

## 2014-06-25 NOTE — Progress Notes (Signed)
   Subjective:    Patient ID: Corey Oneill, male    DOB: 1936/10/08, 78 y.o.   MRN: [id number]  HPI Comments: Pt states he had rubbing and corns to the B/L 2nd toes and found that a certain pair of loafers contributed to the problem.  Pt has quit wearing the shoes and has been measured to wear a wider shoe, but continues to have B/L 2nd toe contraction.     Review of Systems  All other systems reviewed and are negative.      Objective:   Physical Exam        Assessment & Plan:

## 2014-07-28 DIAGNOSIS — C61 Malignant neoplasm of prostate: Secondary | ICD-10-CM | POA: Diagnosis not present

## 2014-08-12 ENCOUNTER — Ambulatory Visit (INDEPENDENT_AMBULATORY_CARE_PROVIDER_SITE_OTHER): Payer: Medicare Other | Admitting: Podiatry

## 2014-08-12 VITALS — BP 130/79 | HR 68 | Resp 16

## 2014-08-12 DIAGNOSIS — M2040 Other hammer toe(s) (acquired), unspecified foot: Secondary | ICD-10-CM

## 2014-08-12 NOTE — Progress Notes (Signed)
Subjective:     Patient ID: Corey Oneill, male   DOB: 1936-02-08, 78 y.o.   MRN: [id number]  HPI patient presents stating my second toe really started to hurt again last week but it feels some better now   Review of Systems     Objective:   Physical Exam Neurovascular status intact with elevated second toe left with redness noted but improved from previous visit    Assessment:     Rigid hammertoe deformity second toe left foot with inflammation    Plan:     Reviewed condition and discussed consideration for digital fusion if symptoms persist but we will continue to work with pads at the current time. Also shoe gear modification encouraged

## 2014-08-21 DIAGNOSIS — K625 Hemorrhage of anus and rectum: Secondary | ICD-10-CM | POA: Diagnosis not present

## 2014-08-21 DIAGNOSIS — K627 Radiation proctitis: Secondary | ICD-10-CM | POA: Diagnosis not present

## 2014-08-24 ENCOUNTER — Telehealth: Payer: Self-pay | Admitting: Family Medicine

## 2014-08-24 NOTE — Telephone Encounter (Signed)
Pt states he mailed copy testosterone with question about his low testosterone & hasn't heard anything back.  I had him refax and the copy is in your folder.  Please call him about his level and your recommendations

## 2014-08-28 ENCOUNTER — Encounter: Payer: Self-pay | Admitting: Family Medicine

## 2014-11-11 ENCOUNTER — Ambulatory Visit (INDEPENDENT_AMBULATORY_CARE_PROVIDER_SITE_OTHER): Payer: Medicare Other

## 2014-11-11 ENCOUNTER — Ambulatory Visit (INDEPENDENT_AMBULATORY_CARE_PROVIDER_SITE_OTHER): Payer: Medicare Other | Admitting: Podiatry

## 2014-11-11 ENCOUNTER — Encounter: Payer: Self-pay | Admitting: Podiatry

## 2014-11-11 DIAGNOSIS — M2042 Other hammer toe(s) (acquired), left foot: Secondary | ICD-10-CM

## 2014-11-12 NOTE — Progress Notes (Signed)
Subjective:     Patient ID: Corey Oneill, male   DOB: Mar 24, 1936, 78 y.o.   MRN: [id number]  HPI patient presents stating I'm ready to get these 2 toes fixed in the new year. States that the second toe continues to give him problems the third toe is in an abnormal position he gets redness and irritation on top of the toe is having trouble wearing quite a few of his shoes   Review of Systems     Objective:   Physical Exam Neurovascular status intact no health history changes noted and is noted to have rigid digital contracture digits 23 of the left foot with pain on top of the second toe and inability to wear shoe gear without discomfort patient is having difficulty also with certain types of ambulation due to the discomfort secondary to the toe structure    Assessment:     Rigid hammertoe deformity second and third toes of the left foot with redness on top of the second toe and irritation secondary to pressure    Plan:     Reviewed condition and treatment options. Padding has only helped him to such a degree and he would rather go ahead and get these corrected and I recommended digital fusion of digits 23 of the left foot. I allowed him to read a consent form concerning correction and explained all possible complications as listed and reviewed the fact there is no guarantee as to the position we can put these 2 toes and they may not contact the ground and also they may still have quite a bit of rotation and elevation even after surgery. Patient wants procedure and signed consent form after extensive review and is scheduled for outpatient surgery in the new year and he is given all preoperative instructions. Encouraged to call with any further questions

## 2014-11-13 DIAGNOSIS — Z8719 Personal history of other diseases of the digestive system: Secondary | ICD-10-CM

## 2014-11-13 HISTORY — DX: Personal history of other diseases of the digestive system: Z87.19

## 2014-11-20 ENCOUNTER — Ambulatory Visit (INDEPENDENT_AMBULATORY_CARE_PROVIDER_SITE_OTHER): Payer: Medicare Other | Admitting: Family Medicine

## 2014-11-20 ENCOUNTER — Encounter: Payer: Self-pay | Admitting: Family Medicine

## 2014-11-20 VITALS — BP 124/80 | HR 76 | Wt 159.0 lb

## 2014-11-20 DIAGNOSIS — K627 Radiation proctitis: Secondary | ICD-10-CM

## 2014-11-20 DIAGNOSIS — Z23 Encounter for immunization: Secondary | ICD-10-CM | POA: Diagnosis not present

## 2014-11-20 DIAGNOSIS — F329 Major depressive disorder, single episode, unspecified: Secondary | ICD-10-CM | POA: Diagnosis not present

## 2014-11-20 DIAGNOSIS — F32A Depression, unspecified: Secondary | ICD-10-CM | POA: Insufficient documentation

## 2014-11-20 LAB — HEMOCCULT GUIAC POC 1CARD (OFFICE)

## 2014-11-20 MED ORDER — HYDROCORTISONE ACETATE 25 MG RE SUPP: 25.0000 mg | Freq: Two times a day (BID) | RECTAL | Status: DC

## 2014-11-20 NOTE — Progress Notes (Signed)
   Subjective:    Patient ID: Corey Oneill, male    DOB: August 07, 1936, 79 y.o.   MRN: [id number]  HPI He is here for consultation concerning continued difficulty with rectal bleeding. He has had this for quite some time and has been evaluated by Dr. Earlean Shawl for this. 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 He was given an expensive medication that he never refill. He continues to note bleeding especially after a BM when he waits.  At the end of the interview after discussion of his proctitis and treatment he then mentioned the fact that he feels depressed. He has been seen in the past by Durene Romans but did not find it useful. He apparently was told to get marriage counseling. He cites marital issues as well as economic issues and bad life decisions making it necessary him to continue to work.   Review of Systems     Objective:   Physical Exam Alert and in no distress with appropriate affect and dressed appropriately. Rectal exam shows no fissures or hemorrhoids. Stool is brown but guaiac positive.       Assessment & Plan:  Radiation proctitis - Plan: hydrocortisone (ANUSOL-HC) 25 MG suppository, POCT occult blood stool  Need for prophylactic vaccination against Streptococcus pneumoniae (pneumococcus) - Plan: Pneumococcal polysaccharide vaccine 23-valent greater than or equal to 2yo subcutaneous/IM  Depression  if continued difficulty with the bleeding, I will refer him back to Dr. Earlean Shawl. Discussed treatment of his depression and will initially have him seek counseling. He eventually place him on medication but not at the present time.

## 2014-12-23 ENCOUNTER — Encounter: Payer: Self-pay | Admitting: Family Medicine

## 2014-12-23 ENCOUNTER — Ambulatory Visit (INDEPENDENT_AMBULATORY_CARE_PROVIDER_SITE_OTHER): Payer: Medicare Other | Admitting: Family Medicine

## 2014-12-23 VITALS — BP 114/70 | HR 68 | Wt 152.0 lb

## 2014-12-23 DIAGNOSIS — K627 Radiation proctitis: Secondary | ICD-10-CM | POA: Diagnosis not present

## 2014-12-23 DIAGNOSIS — F329 Major depressive disorder, single episode, unspecified: Secondary | ICD-10-CM | POA: Diagnosis not present

## 2014-12-23 DIAGNOSIS — R634 Abnormal weight loss: Secondary | ICD-10-CM

## 2014-12-23 DIAGNOSIS — F32A Depression, unspecified: Secondary | ICD-10-CM

## 2014-12-23 LAB — CBC WITH DIFFERENTIAL/PLATELET
BASOS PCT: 1 % (ref 0–1)
Basophils Absolute: 0 10*3/uL (ref 0.0–0.1)
Eosinophils Absolute: 0.2 10*3/uL (ref 0.0–0.7)
Eosinophils Relative: 5 % (ref 0–5)
HCT: 40.9 % (ref 39.0–52.0)
Hemoglobin: 13.5 g/dL (ref 13.0–17.0)
LYMPHS PCT: 28 % (ref 12–46)
Lymphs Abs: 1 10*3/uL (ref 0.7–4.0)
MCH: 30.3 pg (ref 26.0–34.0)
MCHC: 33 g/dL (ref 30.0–36.0)
MCV: 91.9 fL (ref 78.0–100.0)
MONO ABS: 0.6 10*3/uL (ref 0.1–1.0)
MPV: 10.2 fL (ref 8.6–12.4)
Monocytes Relative: 17 % — ABNORMAL HIGH (ref 3–12)
NEUTROS ABS: 1.8 10*3/uL (ref 1.7–7.7)
NEUTROS PCT: 49 % (ref 43–77)
Platelets: 170 10*3/uL (ref 150–400)
RBC: 4.45 MIL/uL (ref 4.22–5.81)
RDW: 13.1 % (ref 11.5–15.5)
WBC: 3.6 10*3/uL — AB (ref 4.0–10.5)

## 2014-12-23 LAB — COMPREHENSIVE METABOLIC PANEL
ALBUMIN: 4.1 g/dL (ref 3.5–5.2)
ALT: 12 U/L (ref 0–53)
AST: 18 U/L (ref 0–37)
Alkaline Phosphatase: 58 U/L (ref 39–117)
BUN: 16 mg/dL (ref 6–23)
CALCIUM: 8.7 mg/dL (ref 8.4–10.5)
CO2: 29 meq/L (ref 19–32)
Chloride: 103 mEq/L (ref 96–112)
Creat: 0.99 mg/dL (ref 0.50–1.35)
GLUCOSE: 82 mg/dL (ref 70–99)
POTASSIUM: 4.7 meq/L (ref 3.5–5.3)
Sodium: 140 mEq/L (ref 135–145)
Total Bilirubin: 0.7 mg/dL (ref 0.2–1.2)
Total Protein: 6.6 g/dL (ref 6.0–8.3)

## 2014-12-23 MED ORDER — FLUOXETINE HCL 10 MG PO TABS: 10.0000 mg | ORAL_TABLET | Freq: Every day | ORAL | Status: DC

## 2014-12-23 NOTE — Progress Notes (Signed)
   Subjective:    Patient ID: Corey Oneill, male    DOB: 1936/08/08, 79 y.o.   MRN: [id number]  HPI He is here for evaluation of weight loss. He apparently has lost at least 5 pounds in the last month or so. He does not complain of nausea, vomiting, early satiety. He does occasionally see blood in his stool but has a previous history of radiation proctitis from treatment for prostate cancer. He is under stress due to an unhappy marriage and intending need to work due to financial issues. He has not gotten involved in counseling and now feels even more depressed. He feels stuck.   Review of Systems     Objective:   Physical Exam Alert and in no distress. Tympanic membranes and canals are normal. Pharyngeal area is normal. Neck is supple without adenopathy or thyromegaly. Cardiac exam shows a regular sinus rhythm without murmurs or gallops. Lungs are clear to auscultation. abdominal exam shows no masses or tenderness with normal bowel sounds         Assessment & Plan:  Radiation proctitis  Depression - Plan: FLUoxetine (PROZAC) 10 MG tablet  Loss of weight - Plan: CBC with Differential/Platelet, Comprehensive metabolic panel  I doubt that his weight loss is anything significant but I will do blood work. He is to set up an appointment with Dr. Earlean Shawl to further evaluate the bleeding. I will place him on Prozac to help with his depression and encouraged him to get involved in counseling. Recheck here one month

## 2014-12-30 DIAGNOSIS — K627 Radiation proctitis: Secondary | ICD-10-CM | POA: Diagnosis not present

## 2015-01-18 ENCOUNTER — Telehealth: Payer: Self-pay | Admitting: Family Medicine

## 2015-01-18 MED ORDER — FLUOXETINE HCL 20 MG PO TABS: 20.0000 mg | ORAL_TABLET | Freq: Every day | ORAL | Status: DC

## 2015-01-18 NOTE — Telephone Encounter (Signed)
Tell him that I called in the higher dose and have him come in for an appointment in one month

## 2015-01-18 NOTE — Telephone Encounter (Signed)
Pt called and states Fluoxetine not working.  Do you want to increase the dose?   If so pt needs refill to [facility].  Also pt ph [phone]

## 2015-01-18 NOTE — Telephone Encounter (Signed)
The 10 mg dosing is not working. I will call in the 20 mg. Have him come in for follow-up visit in one month

## 2015-01-19 NOTE — Telephone Encounter (Signed)
Pt was notified and will call back to schedule an appt

## 2015-01-22 DIAGNOSIS — C61 Malignant neoplasm of prostate: Secondary | ICD-10-CM | POA: Diagnosis not present

## 2015-01-28 ENCOUNTER — Other Ambulatory Visit: Payer: Self-pay

## 2015-01-28 ENCOUNTER — Telehealth: Payer: Self-pay | Admitting: Internal Medicine

## 2015-01-28 MED ORDER — SIMVASTATIN 40 MG PO TABS: ORAL_TABLET | ORAL | Status: DC

## 2015-01-28 NOTE — Telephone Encounter (Signed)
Refill request for simvastatin 40mg  to [facility]

## 2015-01-28 NOTE — Telephone Encounter (Signed)
done

## 2015-02-01 ENCOUNTER — Ambulatory Visit
Admission: RE | Admit: 2015-02-01 | Discharge: 2015-02-01 | Disposition: A | Discharge status: Home / Self Care | Payer: Medicare Other | Source: Ambulatory Visit | Attending: Family Medicine | Admitting: Family Medicine

## 2015-02-01 ENCOUNTER — Encounter: Payer: Self-pay | Admitting: Family Medicine

## 2015-02-01 ENCOUNTER — Ambulatory Visit (INDEPENDENT_AMBULATORY_CARE_PROVIDER_SITE_OTHER): Payer: Medicare Other | Admitting: Family Medicine

## 2015-02-01 VITALS — BP 120/70 | HR 64 | Wt 153.8 lb

## 2015-02-01 DIAGNOSIS — R011 Cardiac murmur, unspecified: Secondary | ICD-10-CM | POA: Diagnosis not present

## 2015-02-01 DIAGNOSIS — K219 Gastro-esophageal reflux disease without esophagitis: Secondary | ICD-10-CM | POA: Diagnosis not present

## 2015-02-01 DIAGNOSIS — F329 Major depressive disorder, single episode, unspecified: Secondary | ICD-10-CM

## 2015-02-01 DIAGNOSIS — I358 Other nonrheumatic aortic valve disorders: Secondary | ICD-10-CM | POA: Diagnosis not present

## 2015-02-01 DIAGNOSIS — R079 Chest pain, unspecified: Secondary | ICD-10-CM

## 2015-02-01 DIAGNOSIS — C61 Malignant neoplasm of prostate: Secondary | ICD-10-CM | POA: Diagnosis not present

## 2015-02-01 DIAGNOSIS — F32A Depression, unspecified: Secondary | ICD-10-CM

## 2015-02-01 NOTE — Progress Notes (Signed)
   Subjective:    Patient ID: Corey Oneill, male    DOB: 09/11/1936, 79 y.o.   MRN: [id number]  HPI He is here for consultation concerning continued difficulty with fatigue. The fatigue does tend to wax and wane. He can take naps during the day which does help. There are occasions when he does express anhedonia but no suicidal ideation. He presently is on 20 mg of Prozac as well as being in counseling with Darryl Hyers. He recently saw his urologist and his testosterone level was 194. PSA was 0.5. He does drink alcohol at night usually 2 drinks. He then when on to explain that he does have midepigastric pain with physical activity. He notes that when he walks at a good pace he will have the activity and when he slows down it will go away. This has been going on a year and is getting slightly worse. He does have some shortness of breath and weakness with this but no diaphoresis or PND. He has not ever mentioned this in the past.He does have a reflux history and is on Prilosec.   Review of Systems     Objective:   Physical Exam Alert and in no distress. Tympanic membranes and canals are normal. Pharyngeal area is normal. Neck is supple without adenopathy or thyromegaly. Cardiac exam shows a regular sinus rhythm with a 3/6 Systolic murmur heard best along the left sternal border with radiation into the carotids and rotted pulses normal. No gallops. Lungs are clear to auscultation.EKG shows no acute changes        Assessment & Plan:  Exertional chest pain  Aortic systolic murmur on examination  Prostate cancer  Gastroesophageal reflux disease without esophagitis  Depression I believe that what he is calling midepigastric pain is actually cardiac related. Review his record indicates he has not had a murmur in the past. Did recommend he cut back on all alcohol consumption. Discussed decongestants and caffeine as well. Also recommended no naps. At this point the cardiac condition becomes more  of a concern

## 2015-02-09 ENCOUNTER — Encounter: Payer: Self-pay | Admitting: Cardiology

## 2015-02-09 DIAGNOSIS — N529 Male erectile dysfunction, unspecified: Secondary | ICD-10-CM

## 2015-02-09 DIAGNOSIS — E785 Hyperlipidemia, unspecified: Secondary | ICD-10-CM

## 2015-02-09 DIAGNOSIS — K219 Gastro-esophageal reflux disease without esophagitis: Secondary | ICD-10-CM | POA: Diagnosis not present

## 2015-02-09 DIAGNOSIS — I350 Nonrheumatic aortic (valve) stenosis: Secondary | ICD-10-CM | POA: Insufficient documentation

## 2015-02-09 DIAGNOSIS — R0789 Other chest pain: Secondary | ICD-10-CM

## 2015-02-09 DIAGNOSIS — R011 Cardiac murmur, unspecified: Secondary | ICD-10-CM | POA: Diagnosis not present

## 2015-02-09 DIAGNOSIS — R079 Chest pain, unspecified: Secondary | ICD-10-CM | POA: Diagnosis not present

## 2015-02-09 DIAGNOSIS — I359 Nonrheumatic aortic valve disorder, unspecified: Secondary | ICD-10-CM

## 2015-02-09 NOTE — Progress Notes (Signed)
Patient ID: Corey Oneill, male   DOB: [birth year], 79 y.o.   MRN: [id number]   Corey Oneill, Rash    Date of visit:  02/09/2015 DOB:  11-17-1935    Age:  79 yrs. Medical record number:  [id number]     Account number:  [id number] Primary Care Provider: Jill Alexanders C ____________________________ CURRENT DIAGNOSES  1. Murmur  2. Chest pain, unspecified  3. Hyperlipidemia  4. Gastro-esophageal reflux disease ____________________________ ALLERGIES  No Known Allergies ____________________________ MEDICATIONS  1. fluoxetine 20 mg tablet, 1 p.o. daily  2. Vitamin B-12 1,000 mcg tablet, 6000 mcg qd  3. aspirin 325 mg tablet, 1 p.o. daily  4. magnesium 250 mg tablet, 1 p.o. daily  5. omeprazole 20 mg capsule,delayed release, QOD  6. simvastatin 40 mg tablet, QOD ____________________________ CHIEF COMPLAINTS  Abd discomfort  Told heart murmur ____________________________ HISTORY OF PRESENT ILLNESS This 79 year old male is seen at the request of Dr. Redmond School for evaluation of a murmur as well his exertional epigastric pain. The patient has a history of prostate cancer treated with external beam radiation and has been having some difficulty with radiation-induced proctitis since then. He also has lost about 20 pounds of weight over the past couple of years. He began to complain of a fairly constant midepigastric type pain that was there more or less all of the time but would be worsened sometimes if he tried to walk and was associated with shortness of breath. The discomfort has never completely gone away according to him however. He has also had significant fatigue as well as lack of energy and has been dealing with depression. Is under situational stress at home and also is involved still having to work from a financial viewpoint. On a recent examination because of chest pain a murmur of aortic stenosis was heard. Patient denies syncope and does not have palpitations or claudication. He denies PND, orthopnea,  or significant edema. He has been treated for hyperlipidemia previously. He does try to walk and to exercise on a regular basis. ____________________________ PAST HISTORY  Past Medical Illnesses:  prostate cancer treated with external beam radiation 2014, depression, hyperlipidemia, GERD;  Cardiovascular Illnesses:  CAD;  Surgical Procedures:  hernia repair, tonsillectomy, nasal surgeries;  NYHA Classification:  I;  Canadian Angina Classification:  Class 0: Asymptomatic;  Cardiology Procedures-Invasive:  no history of prior cardiac procedures;  Cardiology Procedures-Noninvasive:  treadmill;  LVEF not documented,   ____________________________ CARDIO-PULMONARY TEST DATES EKG Date:  02/09/2015;  Chest Xray Date: 02/01/2015;   ____________________________ FAMILY HISTORY Father -- Father dead, Cancer Mother -- Mother dead, Malignant lymphoma Sister -- Sister alive with problem, Dementia/Alzheimers ____________________________ SOCIAL HISTORY Smoking:  used to smoke but quit Prior to 1980;  Diet:  regular diet;  Lifestyle:  married;  Exercise:  walking;  Occupation:  Dealer;  Residence:  lives with wife;   ____________________________ REVIEW OF SYSTEMS General:  weight loss of approximately 20 lbs  Integumentary:no rashes or new skin lesions. Eyes: wears eye glasses/contact lenses Ears, Nose, Throat, Mouth:  denies any hearing loss, epistaxis, hoarseness or difficulty speaking. Respiratory: dyspnea with exertion Cardiovascular:  please review HPI Abdominal: radiation proctitis with diarrhea and bleeding Genitourinary-Male: nocturia  Musculoskeletal:  denies arthritis, venous insufficiency, or muscle weakness Neurological:  denies headaches, stroke, or TIA Psychiatric:  denies depression or anxiety  ____________________________ PHYSICAL EXAMINATION VITAL SIGNS  Blood Pressure:  120/70 Sitting, Left arm, regular cuff  , 114/70 Standing, Left arm and regular cuff   Pulse:  76/min. Weight:  153.00 lbs. Height:  72"BMI: 21  Constitutional:  pleasant white male in no acute distress Skin:  multiple sebaceous keratoses Head:  normocephalic, normal hair pattern, no masses or tenderness Eyes:  EOMS Intact, PERRLA, C and S clear, Funduscopic exam not done. ENT:  ears, nose and throat reveal no gross abnormalities.  Dentition good. Neck:  supple, without massess. No JVD, thyromegaly or carotid bruits. Carotid upstroke normal. Chest:  normal symmetry, clear to auscultation. Cardiac:  regular rhythm, normal S1 and S2, no S3 or S4, grade 2/6 systolic murmur at aortic area radiating to neck Abdomen:  abdomen soft,non-tender, no masses, no hepatospenomegaly, or aneurysm noted Peripheral Pulses:  the femoral,dorsalis pedis, and posterior tibial pulses are full and equal bilaterally with no bruits auscultated. Extremities & Back:  no deformities, clubbing, cyanosis, erythema or edema observed. Normal muscle strength and tone. Neurological:  no gross motor or sensory deficits noted, affect appropriate, oriented x3. ____________________________ IMPRESSIONS/PLAN  1. Systolic heart murmur that is suggestive of an aortic stenosis murmur. It does not sound severe by examination 2. Somewhat atypical epigastric pain that is exertionally related it could be an angina equivalent. He has a normal EKG. 3. Hyperlipidemia under treatment 4. History of prostate cancer treated with radiation therapy 5. Depression 6. Abnormal weight loss  Recommendations:  His examination was remarkable for a systolic heart murmur. His EKG is normal. I've recommended an echocardiogram to assess the cardiac murmur and that he have a standard exercise treadmill test to see what his exercise capacity is and then decide what workup needs to be done from there. Thank you for asking me to see him with you. ____________________________ TODAYS ORDERS  1. 2D, color flow, doppler: First Available  2. treadmill:   Regular TM First Available  3. 12 Lead EKG: Today                       ____________________________ Cardiology Physician:  Kerry Hough MD [facility]

## 2015-02-17 ENCOUNTER — Encounter: Payer: Self-pay | Admitting: Internal Medicine

## 2015-02-22 DIAGNOSIS — R011 Cardiac murmur, unspecified: Secondary | ICD-10-CM | POA: Diagnosis not present

## 2015-02-26 ENCOUNTER — Encounter: Payer: Self-pay | Admitting: Cardiology

## 2015-03-02 ENCOUNTER — Encounter: Payer: Medicare Other | Admitting: Family Medicine

## 2015-03-02 ENCOUNTER — Encounter: Payer: Self-pay | Admitting: Family Medicine

## 2015-03-04 ENCOUNTER — Encounter: Payer: Medicare Other | Admitting: Family Medicine

## 2015-03-22 ENCOUNTER — Encounter: Payer: Self-pay | Admitting: Family Medicine

## 2015-03-22 ENCOUNTER — Ambulatory Visit (INDEPENDENT_AMBULATORY_CARE_PROVIDER_SITE_OTHER): Payer: Medicare Other | Admitting: Family Medicine

## 2015-03-22 VITALS — BP 120/70 | HR 70 | Wt 153.4 lb

## 2015-03-22 DIAGNOSIS — K627 Radiation proctitis: Secondary | ICD-10-CM | POA: Diagnosis not present

## 2015-03-22 DIAGNOSIS — C61 Malignant neoplasm of prostate: Secondary | ICD-10-CM | POA: Diagnosis not present

## 2015-03-22 DIAGNOSIS — F329 Major depressive disorder, single episode, unspecified: Secondary | ICD-10-CM | POA: Diagnosis not present

## 2015-03-22 DIAGNOSIS — E785 Hyperlipidemia, unspecified: Secondary | ICD-10-CM | POA: Diagnosis not present

## 2015-03-22 DIAGNOSIS — K219 Gastro-esophageal reflux disease without esophagitis: Secondary | ICD-10-CM

## 2015-03-22 DIAGNOSIS — I350 Nonrheumatic aortic (valve) stenosis: Secondary | ICD-10-CM

## 2015-03-22 DIAGNOSIS — F32A Depression, unspecified: Secondary | ICD-10-CM

## 2015-03-22 MED ORDER — BUPROPION HCL ER (SR) 100 MG PO TB12: 100.0000 mg | ORAL_TABLET | Freq: Two times a day (BID) | ORAL | Status: DC

## 2015-03-22 MED ORDER — SIMVASTATIN 40 MG PO TABS
ORAL_TABLET | ORAL | Status: DC
Start: 2015-03-22 — End: 2015-03-22

## 2015-03-22 MED ORDER — SIMVASTATIN 40 MG PO TABS: ORAL_TABLET | ORAL | Status: DC

## 2015-03-22 MED ORDER — FLUOXETINE HCL 10 MG PO CAPS: 10.0000 mg | ORAL_CAPSULE | Freq: Every day | ORAL | Status: DC

## 2015-03-22 NOTE — Progress Notes (Signed)
   Subjective:    Patient ID: Corey Oneill, male    DOB: [birth year], 79 y.o.   MRN: [id number]  HPI He is here for complete examination. He is in the process of being more fully evaluated for his underlying aortic stenosis. He is scheduled to see Dr. Burt Knack in follow-up for this. He also has had radiation for his prostate surgery and is now having some difficulty with radiation proctitis. He has had some rectal bleeding. His last PSA was 0.59. Apparently his testosterone was 194 does feel quite fatigued and doubly has evidence of depression. He has been involved in counseling and has been to 3 sessions however he is unwilling/unable to make the changes necessary in his life. He states that the increased dose of Prozac is really had no positive effect. His reflux seems to be under good control.His marriage is essentially status quo. He feels as if they are just sharing basis rather than truly having a good relationship he is unable to make any further changes. He also has a history of hyperlipidemia. He also has reflux disease and especially notes difficulty with his voice if he doesn't take his Prilosec.   Review of Systems  All other systems reviewed and are negative.      Objective:   Physical Exam Alert and in no distress. Tympanic membranes and canals are normal. Pharyngeal area is normal. Neck is supple without adenopathy or thyromegaly. Cardiac exam shows a regular sinus rhythm with 3/6 SEM,no gallops. Lungs are clear to auscultation.Abdominal exam shows no hepatosplenomegaly, masses or tenderness.        Assessment & Plan:  Aortic stenosis  Prostate cancer  Hyperlipidemia - Plan: Lipid panel  Depression - Plan: FLUoxetine (PROZAC) 10 MG capsule, buPROPion (WELLBUTRIN SR) 100 MG 12 hr tablet  Radiation proctitis  Gastroesophageal reflux disease, esophagitis presence not specified - Plan: simvastatin (ZOCOR) 40 MG tablet, DISCONTINUED: simvastatin (ZOCOR) 40 MG tablet He will  follow-up with his cardiologist. I will discuss possible testosterone replacement with Dr. Junious Silk at the patient's request.I will back him off to Prozac 10 mg and add Wellbutrin to his regimen. He is to return here in one month.

## 2015-03-23 DIAGNOSIS — H52223 Regular astigmatism, bilateral: Secondary | ICD-10-CM | POA: Diagnosis not present

## 2015-03-23 DIAGNOSIS — H524 Presbyopia: Secondary | ICD-10-CM | POA: Diagnosis not present

## 2015-03-23 DIAGNOSIS — H5203 Hypermetropia, bilateral: Secondary | ICD-10-CM | POA: Diagnosis not present

## 2015-03-23 LAB — LIPID PANEL
CHOL/HDL RATIO: 2.9 ratio
Cholesterol: 169 mg/dL (ref 0–200)
HDL: 58 mg/dL (ref >=40)
LDL CALC: 101 mg/dL — AB (ref 0–99)
TRIGLYCERIDES: 51 mg/dL (ref <150)
VLDL: 10 mg/dL (ref 0–40)

## 2015-03-23 NOTE — Progress Notes (Signed)
   Subjective:    Patient ID: Corey Oneill, male    DOB: 1936/10/17, 79 y.o.   MRN: [id number]  HPI    Review of Systems     Objective:   Physical Exam        Assessment & Plan:  I informed him that the insulin good and continue on his present statin. I also discussed his low testosterone with Dr. Junious Silk discussed that with the patient on the next visit.

## 2015-03-23 NOTE — Addendum Note (Signed)
Addended by: [name] on: 03/23/2015 01:09 PM   Modules accepted: Level of Service

## 2015-03-24 ENCOUNTER — Telehealth: Payer: Self-pay | Admitting: Internal Medicine

## 2015-03-24 NOTE — Telephone Encounter (Signed)
Pt called stating that he takes his wellbutrin twice day. Once in the morning and once in the afternoon but when he takes it around 4-5 in the afternoon and he goes to bed around 10, he is wide awake and wanting to go to once a day taking wellbutrin along with the prozac. Is that ok

## 2015-03-24 NOTE — Telephone Encounter (Signed)
Have him take both Wellbutrin in the morning

## 2015-03-24 NOTE — Telephone Encounter (Signed)
Called pt. To advise Dr. Lanice Shirts note

## 2015-03-30 ENCOUNTER — Other Ambulatory Visit: Payer: Self-pay | Admitting: Family Medicine

## 2015-04-02 ENCOUNTER — Encounter: Payer: Self-pay | Admitting: Cardiovascular Disease

## 2015-04-02 ENCOUNTER — Ambulatory Visit (INDEPENDENT_AMBULATORY_CARE_PROVIDER_SITE_OTHER): Payer: Medicare Other | Admitting: Cardiovascular Disease

## 2015-04-02 VITALS — BP 122/68 | HR 75 | Ht 72.0 in | Wt 149.0 lb

## 2015-04-02 DIAGNOSIS — I350 Nonrheumatic aortic (valve) stenosis: Secondary | ICD-10-CM

## 2015-04-02 NOTE — Patient Instructions (Signed)
Medication Instructions:  Your physician recommends that you continue on your current medications as directed. Please refer to the Current Medication list given to you today.  Labwork: Your physician recommends that you return for lab work in: 2 WEEKS (BMP, CBC and PT/INR), Lab hours 7:30 AM-5:00 PM   Testing/Procedures: Your physician has requested that you have a cardiac catheterization. Cardiac catheterization is used to diagnose and/or treat various heart conditions. Doctors may recommend this procedure for a number of different reasons. The most common reason is to evaluate chest pain. Chest pain can be a symptom of coronary artery disease (CAD), and cardiac catheterization can show whether plaque is narrowing or blocking your heart's arteries. This procedure is also used to evaluate the valves, as well as measure the blood flow and oxygen levels in different parts of your heart. For further information please visit [facility]. Please follow instruction sheet, as given.  Follow-Up: We will arrange further follow-up after your cardiac catheterization.   Any Other Special Instructions Will Be Listed Below (If Applicable).

## 2015-04-02 NOTE — Progress Notes (Signed)
Cardiology Office Note   Date:  04/04/2015   ID:  Corey Oneill, DOB Oct 17, 1936, MRN [id number]  PCP:  [name], MD  Cardiologist:  [name], MD    Chief Complaint  Patient presents with  . Chest Pain    History of Present Illness: Corey Oneill is a 79 y.o. male who presents for evaluation of aortic valve disease. The patient was evaluated by Dr. Wynonia Lawman in March for a systolic heart murmur and exertional angina. He underwent an echocardiogram and exercise treadmill study. The echo study showed normal LV systolic function with moderate to severe aortic stenosis. The mean aortic valve gradient is 33 mm Hg and peak gradient is 50 mmHg. There is no other significant valvular disease noted.   The patient reports a 3 to four-month history of exertional lower chest/upper epigastric tightness. He has experience this with walking his dog. On one occasion the pain was quite severe and he had associated nausea. Otherwise his symptoms have been essentially stable over the last few months. He reports that the chest discomfort resolved with slowing down and stopping after just a few minutes. There has been mild associated shortness of breath. He denies lightheadedness or syncope. He has had no resting symptoms.  The patient has no past history of cardiac disease. He was diagnosed with prostate cancer a few years ago and was treated with external beam radiation therapy. 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 Otherwise he has been quite healthy. He continues to work as a Programmer, systems and travels frequently.   Past Medical History  Diagnosis Date  . Hyperlipidemia   . Esophageal reflux   . Prostate cancer   . Hx of radiation therapy 04/14/13-06/09/13    prostate 7800 cGy, 40 sessions, seminal vesicles 5600 cGy 40 sessions    Past Surgical History  Procedure Laterality Date  . Nose surgery    . Tonsillectomy    . Needle biopsy of prostate    .  Colonoscopy    . Prostate surgery      Current Outpatient Prescriptions  Medication Sig Dispense Refill  . aspirin 325 MG tablet Take 325 mg by mouth daily.      [name] buPROPion (WELLBUTRIN SR) 100 MG 12 hr tablet Take 1 tablet (100 mg total) by mouth 2 (two) times daily. 30 tablet 1  . FLUoxetine (PROZAC) 10 MG capsule Take 1 capsule (10 mg total) by mouth daily. 90 capsule 3  . omeprazole (PRILOSEC) 20 MG capsule Take 20 mg by mouth daily.    . simvastatin (ZOCOR) 40 MG tablet Take 40 mg by mouth daily at 6 PM.    . vitamin B-12 (CYANOCOBALAMIN) 1000 MCG tablet Take 1,000 mcg by mouth daily.     No current facility-administered medications for this visit.    Allergies:   Review of patient's allergies indicates no known allergies.   Social History:  The patient  reports that he quit smoking about 47 years ago. He has never used smokeless tobacco. He reports that he drinks about 7.5 oz of alcohol per week. He reports that he does not use illicit drugs.   Family History:  The patient's family history includes Brain cancer in his father; Depression in his daughter; Lymphoma in his mother.   ROS:  Please see the history of present illness.  Otherwise, review of systems is positive for blood in stool (radiation proctitis), depression, weight loss, shortness of breath with exertion, weight loss.  All other  systems are reviewed and negative.   PHYSICAL EXAM: VS:  BP 122/68 mmHg  Pulse 75  Ht 6' (1.829 m)  Wt 149 lb (67.586 kg)  BMI 20.20 kg/m2  SpO2 99% , BMI Body mass index is 20.2 kg/(m^2). GEN: Well nourished, well developed, in no acute distress HEENT: normal Neck: no JVD, no masses. Bilateral carotid bruits Cardiac: The Apex is discrete and nondisplaced. The heart is regular rate and rhythm with a grade 2/6 mid-peaking ejection murmur at the right upper sternal border and preserved A2            Respiratory:  clear to auscultation bilaterally, normal work of breathing GI: soft,  nontender, nondistended, + BS MS: no deformity or atrophy Ext: no pretibial edema, pedal pulses 2+= bilaterally Skin: warm and dry, no rash Neuro:  Strength and sensation are intact Psych: euthymic mood, full affect  EKG:  EKG is not ordered today.  Recent Labs: 12/23/2014: ALT 12; BUN 16; Creatinine 0.99; Hemoglobin 13.5; Platelets 170; Potassium 4.7; Sodium 140   Lipid Panel     Component Value Date/Time   CHOL 169 03/22/2015 0001   TRIG 51 03/22/2015 0001   HDL 58 03/22/2015 0001   CHOLHDL 2.9 03/22/2015 0001   VLDL 10 03/22/2015 0001   LDLCALC 101* 03/22/2015 0001      Wt Readings from Last 3 Encounters:  04/02/15 149 lb (67.586 kg)  03/22/15 153 lb 6.4 oz (69.582 kg)  02/01/15 153 lb 12.8 oz (69.763 kg)    Cardiac Studies Reviewed: 2D echo as outlined above  ASSESSMENT AND PLAN: 79 year old gentleman with aortic stenosis and exertional angina, CCS class II symptoms. I have personally reviewed the patient's echo images. He has very poor parasternal acoustic windows. The aortic valve is difficult to visualize but there does appear to be calcification and restricted mobility of the aortic valve leaflets. Transvalvular gradients suggest at least moderate aortic stenosis, and his physical exam is consistent with this. The patient has also developed typical symptoms of exertional angina over the last few months and this warrants further evaluation. I have discussed diagnostic options with patient and his wife who is present today, and I would favor left and right heart catheterization to evaluate for obstructive CAD and better characterize the degree of aortic stenosis. I have reviewed the risks, indications, and alternatives to cardiac catheterization and possible angioplasty/stenting  with the patient. Risks include but are not limited to bleeding, infection, vascular injury, stroke, myocardial infection, arrhythmia, kidney injury, radiation-related injury in the case of prolonged  fluoroscopy use, emergency cardiac surgery, and death. The patient understands the risks of serious complication is low (<5%).   Further plans for revascularization and treatment of aortic stenosis depending on cardiac cath findings. All questions answered.   Current medicines are reviewed with the patient today.  The patient does not have concerns regarding medicines.  Labs/ tests ordered today include:   Orders Placed This Encounter  Procedures  . Basic Metabolic Panel (BMET)  . CBC  . INR/PT   Disposition:   Cardiac cath as outlined above  Signed, [name], MD  04/04/2015 8:59 AM    [facility] [address], [place], [place]  [address] Phone: [phone]; Fax: [phone]

## 2015-04-04 ENCOUNTER — Encounter: Payer: Self-pay | Admitting: Cardiovascular Disease

## 2015-04-15 DIAGNOSIS — H16213 Exposure keratoconjunctivitis, bilateral: Secondary | ICD-10-CM | POA: Diagnosis not present

## 2015-04-16 ENCOUNTER — Other Ambulatory Visit (INDEPENDENT_AMBULATORY_CARE_PROVIDER_SITE_OTHER): Payer: Medicare Other

## 2015-04-16 DIAGNOSIS — I350 Nonrheumatic aortic (valve) stenosis: Secondary | ICD-10-CM

## 2015-04-16 LAB — CBC
HCT: 34.7 % — ABNORMAL LOW (ref 39.0–52.0)
HEMOGLOBIN: 11.3 g/dL — AB (ref 13.0–17.0)
MCHC: 32.4 g/dL (ref 30.0–36.0)
MCV: 81 fl (ref 78.0–100.0)
Platelets: 209 10*3/uL (ref 150.0–400.0)
RBC: 4.28 Mil/uL (ref 4.22–5.81)
RDW: 16 % — ABNORMAL HIGH (ref 11.5–15.5)
WBC: 5.3 10*3/uL (ref 4.0–10.5)

## 2015-04-16 LAB — BASIC METABOLIC PANEL
BUN: 17 mg/dL (ref 6–23)
CHLORIDE: 100 meq/L (ref 96–112)
CO2: 31 mEq/L (ref 19–32)
CREATININE: 0.93 mg/dL (ref 0.40–1.50)
Calcium: 8.8 mg/dL (ref 8.4–10.5)
GFR: 83.67 mL/min (ref >60.00)
GLUCOSE: 89 mg/dL (ref 70–99)
Potassium: 3.9 mEq/L (ref 3.5–5.1)
Sodium: 134 mEq/L — ABNORMAL LOW (ref 135–145)

## 2015-04-16 LAB — PROTIME-INR
INR: 1.1 ratio — AB (ref 0.8–1.0)
PROTHROMBIN TIME: 12.7 s (ref 9.6–13.1)

## 2015-04-20 ENCOUNTER — Telehealth: Payer: Self-pay | Admitting: Cardiovascular Disease

## 2015-04-20 DIAGNOSIS — K627 Radiation proctitis: Secondary | ICD-10-CM | POA: Diagnosis not present

## 2015-04-20 NOTE — Telephone Encounter (Signed)
Patient is calling to let Dr. Burt Knack know that he has slight rectal bleeding due to Radiation Proctitis he has been seeing GI doctor for this. He is worried about the site that will be used and he doesn't think going through the groin will be good. Will forward to Dr. Burt Knack to make him aware.

## 2015-04-20 NOTE — Telephone Encounter (Signed)
New message      Pt is having a cath tomorrow.  He want to make the nurse aware of some problems he is having today

## 2015-04-20 NOTE — Telephone Encounter (Signed)
Updated patient and confirmed arrival time.

## 2015-04-20 NOTE — Telephone Encounter (Signed)
Left message to call back  

## 2015-04-20 NOTE — Telephone Encounter (Signed)
We generally can do a right and left heart cath from brachial and radial access in the arm and will attempt to do his procedure that way.

## 2015-04-21 ENCOUNTER — Encounter (HOSPITAL_COMMUNITY): Payer: Self-pay | Admitting: Cardiovascular Disease

## 2015-04-21 ENCOUNTER — Encounter (HOSPITAL_COMMUNITY)
Admission: RE | Disposition: A | Discharge status: Home / Self Care | Payer: Medicare Other | Source: Ambulatory Visit | Attending: Cardiovascular Disease

## 2015-04-21 ENCOUNTER — Ambulatory Visit (HOSPITAL_COMMUNITY)
Admission: RE | Admit: 2015-04-21 | Discharge: 2015-04-21 | Disposition: A | Discharge status: Home / Self Care | Payer: Medicare Other | Source: Ambulatory Visit | Attending: Cardiovascular Disease | Admitting: Cardiovascular Disease

## 2015-04-21 DIAGNOSIS — E785 Hyperlipidemia, unspecified: Secondary | ICD-10-CM | POA: Diagnosis not present

## 2015-04-21 DIAGNOSIS — I25118 Atherosclerotic heart disease of native coronary artery with other forms of angina pectoris: Secondary | ICD-10-CM

## 2015-04-21 DIAGNOSIS — I350 Nonrheumatic aortic (valve) stenosis: Secondary | ICD-10-CM | POA: Insufficient documentation

## 2015-04-21 DIAGNOSIS — K219 Gastro-esophageal reflux disease without esophagitis: Secondary | ICD-10-CM | POA: Insufficient documentation

## 2015-04-21 DIAGNOSIS — Z8546 Personal history of malignant neoplasm of prostate: Secondary | ICD-10-CM | POA: Diagnosis not present

## 2015-04-21 DIAGNOSIS — Z923 Personal history of irradiation: Secondary | ICD-10-CM | POA: Insufficient documentation

## 2015-04-21 DIAGNOSIS — Z7982 Long term (current) use of aspirin: Secondary | ICD-10-CM | POA: Insufficient documentation

## 2015-04-21 DIAGNOSIS — Z87891 Personal history of nicotine dependence: Secondary | ICD-10-CM | POA: Diagnosis not present

## 2015-04-21 DIAGNOSIS — I2584 Coronary atherosclerosis due to calcified coronary lesion: Secondary | ICD-10-CM | POA: Insufficient documentation

## 2015-04-21 HISTORY — PX: CARDIAC CATHETERIZATION: SHX172

## 2015-04-21 LAB — POCT I-STAT 3, VENOUS BLOOD GAS (G3P V)
Acid-Base Excess: 3 mmol/L — ABNORMAL HIGH (ref 0.0–2.0)
Bicarbonate: 28.6 mEq/L — ABNORMAL HIGH (ref 20.0–24.0)
O2 Saturation: 69 %
PCO2 VEN: 49 mmHg (ref 45.0–50.0)
TCO2: 30 mmol/L (ref 0–100)
pH, Ven: 7.374 — ABNORMAL HIGH (ref 7.250–7.300)
pO2, Ven: 38 mmHg (ref 30.0–45.0)

## 2015-04-21 LAB — POCT I-STAT 3, ART BLOOD GAS (G3+)
ACID-BASE EXCESS: 1 mmol/L (ref 0.0–2.0)
BICARBONATE: 27 meq/L — AB (ref 20.0–24.0)
O2 SAT: 98 %
PCO2 ART: 48.3 mmHg — AB (ref 35.0–45.0)
PH ART: 7.355 (ref 7.350–7.450)
TCO2: 28 mmol/L (ref 0–100)
pO2, Arterial: 114 mmHg — ABNORMAL HIGH (ref 80.0–100.0)

## 2015-04-21 SURGERY — RIGHT/LEFT HEART CATH AND CORONARY ANGIOGRAPHY
Anesthesia: LOCAL

## 2015-04-21 MED ORDER — VERAPAMIL HCL 2.5 MG/ML IV SOLN
INTRAVENOUS | Status: DC | PRN
  Administered 2015-04-21: 09:00:00 via INTRA_ARTERIAL

## 2015-04-21 MED ORDER — FENTANYL CITRATE (PF) 100 MCG/2ML IJ SOLN
INTRAMUSCULAR | Status: DC | PRN
  Administered 2015-04-21: 25 ug via INTRAVENOUS

## 2015-04-21 MED ORDER — LIDOCAINE HCL (PF) 1 % IJ SOLN
INTRAMUSCULAR | Status: DC | PRN
  Administered 2015-04-21 (×2): 5 mL via INTRADERMAL

## 2015-04-21 MED ORDER — SODIUM CHLORIDE 0.9 % IV SOLN: 250.0000 mL | INTRAVENOUS | Status: DC | PRN

## 2015-04-21 MED ORDER — HEPARIN (PORCINE) IN NACL 2-0.9 UNIT/ML-% IJ SOLN
INTRAMUSCULAR | Status: AC
  Filled 2015-04-21: qty 1500

## 2015-04-21 MED ORDER — SODIUM CHLORIDE 0.9 % WEIGHT BASED INFUSION
3.0000 mL/kg/h | INTRAVENOUS | Status: AC
  Administered 2015-04-21: 3 mL/kg/h via INTRAVENOUS

## 2015-04-21 MED ORDER — SODIUM CHLORIDE 0.9 % IV SOLN: INTRAVENOUS | Status: AC

## 2015-04-21 MED ORDER — SODIUM CHLORIDE 0.9 % IJ SOLN: 3.0000 mL | INTRAMUSCULAR | Status: DC | PRN

## 2015-04-21 MED ORDER — IOHEXOL 350 MG/ML SOLN
INTRAVENOUS | Status: DC | PRN
  Administered 2015-04-21: 90 mL via INTRA_ARTERIAL

## 2015-04-21 MED ORDER — MIDAZOLAM HCL 2 MG/2ML IJ SOLN
INTRAMUSCULAR | Status: DC | PRN
  Administered 2015-04-21: 2 mg via INTRAVENOUS

## 2015-04-21 MED ORDER — MIDAZOLAM HCL 2 MG/2ML IJ SOLN
INTRAMUSCULAR | Status: AC
  Filled 2015-04-21: qty 2

## 2015-04-21 MED ORDER — ASPIRIN 81 MG PO CHEW
CHEWABLE_TABLET | ORAL | Status: DC
Start: 2015-04-21 — End: 2015-04-21
  Filled 2015-04-21: qty 1

## 2015-04-21 MED ORDER — HEPARIN SODIUM (PORCINE) 1000 UNIT/ML IJ SOLN
INTRAMUSCULAR | Status: DC | PRN
  Administered 2015-04-21: 4000 [IU] via INTRAVENOUS

## 2015-04-21 MED ORDER — ACETAMINOPHEN 325 MG PO TABS: 650.0000 mg | ORAL_TABLET | ORAL | Status: DC | PRN

## 2015-04-21 MED ORDER — SODIUM CHLORIDE 0.9 % WEIGHT BASED INFUSION: 1.0000 mL/kg/h | INTRAVENOUS | Status: DC

## 2015-04-21 MED ORDER — LIDOCAINE HCL (PF) 1 % IJ SOLN
INTRAMUSCULAR | Status: AC
  Filled 2015-04-21: qty 30

## 2015-04-21 MED ORDER — FENTANYL CITRATE (PF) 100 MCG/2ML IJ SOLN
INTRAMUSCULAR | Status: AC
  Filled 2015-04-21: qty 2

## 2015-04-21 MED ORDER — SODIUM CHLORIDE 0.9 % IJ SOLN: 3.0000 mL | Freq: Two times a day (BID) | INTRAMUSCULAR | Status: DC

## 2015-04-21 MED ORDER — ASPIRIN 81 MG PO CHEW
81.0000 mg | CHEWABLE_TABLET | ORAL | Status: AC
  Administered 2015-04-21: 81 mg via ORAL

## 2015-04-21 MED ORDER — VERAPAMIL HCL 2.5 MG/ML IV SOLN
INTRAVENOUS | Status: AC
  Filled 2015-04-21: qty 2

## 2015-04-21 MED ORDER — ONDANSETRON HCL 4 MG/2ML IJ SOLN: 4.0000 mg | Freq: Four times a day (QID) | INTRAMUSCULAR | Status: DC | PRN

## 2015-04-21 MED ORDER — HEPARIN SODIUM (PORCINE) 1000 UNIT/ML IJ SOLN
INTRAMUSCULAR | Status: AC
  Filled 2015-04-21: qty 1

## 2015-04-21 SURGICAL SUPPLY — 15 items
CATH BALLN WEDGE 5F 110CM (CATHETERS) ×1 IMPLANT
CATH INFINITI 5 FR JL3.5 (CATHETERS) ×2 IMPLANT
CATH INFINITI 5FR AL1 (CATHETERS) ×2 IMPLANT
CATH INFINITI 5FR ANG PIGTAIL (CATHETERS) ×2 IMPLANT
CATH INFINITI JR4 5F (CATHETERS) ×2 IMPLANT
DEVICE RAD COMP TR BAND LRG (VASCULAR PRODUCTS) ×2 IMPLANT
GLIDESHEATH SLEND SS 6F .021 (SHEATH) ×2 IMPLANT
GUIDEWIRE STRAIGHT .035 145CM (WIRE) ×1 IMPLANT
KIT HEART LEFT (KITS) ×2 IMPLANT
PACK CARDIAC CATHETERIZATION (CUSTOM PROCEDURE TRAY) ×2 IMPLANT
SHEATH FAST CATH BRACH 5F 5CM (SHEATH) ×1 IMPLANT
SYR MEDRAD MARK V 150ML (SYRINGE) ×2 IMPLANT
TRANSDUCER W/STOPCOCK (MISCELLANEOUS) ×3 IMPLANT
TUBING CIL FLEX 10 FLL-RA (TUBING) ×2 IMPLANT
WIRE SAFE-T 1.5MM-J .035X260CM (WIRE) ×2 IMPLANT

## 2015-04-21 NOTE — Discharge Instructions (Signed)
Radial Site Care °Refer to this sheet in the next few weeks. These instructions provide you with information on caring for yourself after your procedure. Your caregiver may also give you more specific instructions. Your treatment has been planned according to current medical practices, but problems sometimes occur. Call your caregiver if you have any problems or questions after your procedure. °HOME CARE INSTRUCTIONS °· You may shower the day after the procedure. Remove the bandage (dressing) and gently wash the site with plain soap and water. Gently pat the site dry. °· Do not apply powder or lotion to the site. °· Do not submerge the affected site in water for 3 to 5 days. °· Inspect the site at least twice daily. °· Do not flex or bend the affected arm for 24 hours. °· No lifting over 5 pounds (2.3 kg) for 5 days after your procedure. °· Do not drive home if you are discharged the same day of the procedure. Have someone else drive you. °· You may drive 24 hours after the procedure unless otherwise instructed by your caregiver. °· Do not operate machinery or power tools for 24 hours. °· A responsible adult should be with you for the first 24 hours after you arrive home. °What to expect: °· Any bruising will usually fade within 1 to 2 weeks. °· Blood that collects in the tissue (hematoma) may be painful to the touch. It should usually decrease in size and tenderness within 1 to 2 weeks. °SEEK IMMEDIATE MEDICAL CARE IF: °· You have unusual pain at the radial site. °· You have redness, warmth, swelling, or pain at the radial site. °· You have drainage (other than a small amount of blood on the dressing). °· You have chills. °· You have a fever or persistent symptoms for more than 72 hours. °· You have a fever and your symptoms suddenly get worse. °· Your arm becomes pale, cool, tingly, or numb. °· You have heavy bleeding from the site. Hold pressure on the site and call 911. °Document Released: 12/02/2010 Document  Revised: 01/22/2012 Document Reviewed: 12/02/2010 °ExitCare® Patient Information ©2015 [employer]. This information is not intended to replace advice given to you by your health care provider. Make sure you discuss any questions you have with your health care provider. ° °

## 2015-04-21 NOTE — H&P (View-Only) (Signed)
Cardiology Office Note   Date:  04/04/2015   ID:  VIRAAJ VORNDRAN, DOB Jan 11, 1936, MRN [id number]  PCP:  [name], MD  Cardiologist:  [name], MD    Chief Complaint  Patient presents with  . Chest Pain    History of Present Illness: Corey Oneill is a 79 y.o. male who presents for evaluation of aortic valve disease. The patient was evaluated by Dr. Wynonia Lawman in March for a systolic heart murmur and exertional angina. He underwent an echocardiogram and exercise treadmill study. The echo study showed normal LV systolic function with moderate to severe aortic stenosis. The mean aortic valve gradient is 33 mm Hg and peak gradient is 50 mmHg. There is no other significant valvular disease noted.   The patient reports a 3 to four-month history of exertional lower chest/upper epigastric tightness. He has experience this with walking his dog. On one occasion the pain was quite severe and he had associated nausea. Otherwise his symptoms have been essentially stable over the last few months. He reports that the chest discomfort resolved with slowing down and stopping after just a few minutes. There has been mild associated shortness of breath. He denies lightheadedness or syncope. He has had no resting symptoms.  The patient has no past history of cardiac disease. He was diagnosed with prostate cancer a few years ago and was treated with external beam radiation therapy. 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 Otherwise he has been quite healthy. He continues to work as a Programmer, systems and travels frequently.   Past Medical History  Diagnosis Date  . Hyperlipidemia   . Esophageal reflux   . Prostate cancer   . Hx of radiation therapy 04/14/13-06/09/13    prostate 7800 cGy, 40 sessions, seminal vesicles 5600 cGy 40 sessions    Past Surgical History  Procedure Laterality Date  . Nose surgery    . Tonsillectomy    . Needle biopsy of prostate    .  Colonoscopy    . Prostate surgery      Current Outpatient Prescriptions  Medication Sig Dispense Refill  . aspirin 325 MG tablet Take 325 mg by mouth daily.      [name] buPROPion (WELLBUTRIN SR) 100 MG 12 hr tablet Take 1 tablet (100 mg total) by mouth 2 (two) times daily. 30 tablet 1  . FLUoxetine (PROZAC) 10 MG capsule Take 1 capsule (10 mg total) by mouth daily. 90 capsule 3  . omeprazole (PRILOSEC) 20 MG capsule Take 20 mg by mouth daily.    . simvastatin (ZOCOR) 40 MG tablet Take 40 mg by mouth daily at 6 PM.    . vitamin B-12 (CYANOCOBALAMIN) 1000 MCG tablet Take 1,000 mcg by mouth daily.     No current facility-administered medications for this visit.    Allergies:   Review of patient's allergies indicates no known allergies.   Social History:  The patient  reports that he quit smoking about 47 years ago. He has never used smokeless tobacco. He reports that he drinks about 7.5 oz of alcohol per week. He reports that he does not use illicit drugs.   Family History:  The patient's family history includes Brain cancer in his father; Depression in his daughter; Lymphoma in his mother.   ROS:  Please see the history of present illness.  Otherwise, review of systems is positive for blood in stool (radiation proctitis), depression, weight loss, shortness of breath with exertion, weight loss.  All other  systems are reviewed and negative.   PHYSICAL EXAM: VS:  BP 122/68 mmHg  Pulse 75  Ht 6' (1.829 m)  Wt 149 lb (67.586 kg)  BMI 20.20 kg/m2  SpO2 99% , BMI Body mass index is 20.2 kg/(m^2). GEN: Well nourished, well developed, in no acute distress HEENT: normal Neck: no JVD, no masses. Bilateral carotid bruits Cardiac: The Apex is discrete and nondisplaced. The heart is regular rate and rhythm with a grade 2/6 mid-peaking ejection murmur at the right upper sternal border and preserved A2            Respiratory:  clear to auscultation bilaterally, normal work of breathing GI: soft,  nontender, nondistended, + BS MS: no deformity or atrophy Ext: no pretibial edema, pedal pulses 2+= bilaterally Skin: warm and dry, no rash Neuro:  Strength and sensation are intact Psych: euthymic mood, full affect  EKG:  EKG is not ordered today.  Recent Labs: 12/23/2014: ALT 12; BUN 16; Creatinine 0.99; Hemoglobin 13.5; Platelets 170; Potassium 4.7; Sodium 140   Lipid Panel     Component Value Date/Time   CHOL 169 03/22/2015 0001   TRIG 51 03/22/2015 0001   HDL 58 03/22/2015 0001   CHOLHDL 2.9 03/22/2015 0001   VLDL 10 03/22/2015 0001   LDLCALC 101* 03/22/2015 0001      Wt Readings from Last 3 Encounters:  04/02/15 149 lb (67.586 kg)  03/22/15 153 lb 6.4 oz (69.582 kg)  02/01/15 153 lb 12.8 oz (69.763 kg)    Cardiac Studies Reviewed: 2D echo as outlined above  ASSESSMENT AND PLAN: 79 year old gentleman with aortic stenosis and exertional angina, CCS class II symptoms. I have personally reviewed the patient's echo images. He has very poor parasternal acoustic windows. The aortic valve is difficult to visualize but there does appear to be calcification and restricted mobility of the aortic valve leaflets. Transvalvular gradients suggest at least moderate aortic stenosis, and his physical exam is consistent with this. The patient has also developed typical symptoms of exertional angina over the last few months and this warrants further evaluation. I have discussed diagnostic options with patient and his wife who is present today, and I would favor left and right heart catheterization to evaluate for obstructive CAD and better characterize the degree of aortic stenosis. I have reviewed the risks, indications, and alternatives to cardiac catheterization and possible angioplasty/stenting  with the patient. Risks include but are not limited to bleeding, infection, vascular injury, stroke, myocardial infection, arrhythmia, kidney injury, radiation-related injury in the case of prolonged  fluoroscopy use, emergency cardiac surgery, and death. The patient understands the risks of serious complication is low (<9%).   Further plans for revascularization and treatment of aortic stenosis depending on cardiac cath findings. All questions answered.   Current medicines are reviewed with the patient today.  The patient does not have concerns regarding medicines.  Labs/ tests ordered today include:   Orders Placed This Encounter  Procedures  . Basic Metabolic Panel (BMET)  . CBC  . INR/PT   Disposition:   Cardiac cath as outlined above  Signed, [name], MD  04/04/2015 8:59 AM    [facility] [place], [place], [place]  [address] Phone: [phone]; Fax: [phone]

## 2015-04-21 NOTE — Interval H&P Note (Signed)
History and Physical Interval Note:  04/21/2015 8:54 AM  Corey Oneill  has presented today for surgery, with the diagnosis of aortic stenosis  The various methods of treatment have been discussed with the patient and family. After consideration of risks, benefits and other options for treatment, the patient has consented to  Procedure(s): Right/Left Heart Cath and Coronary Angiography (N/A) as a surgical intervention .  The patient's history has been reviewed, patient examined, no change in status, stable for surgery.  I have reviewed the patient's chart and labs.  Questions were answered to the patient's satisfaction.    Cath Lab Visit (complete for each Cath Lab visit)  Clinical Evaluation Leading to the Procedure:   ACS: No.  Non-ACS:    Anginal Classification: CCS II  Anti-ischemic medical therapy: No Therapy  Non-Invasive Test Results: No non-invasive testing performed  Prior CABG: No previous CABG       [name]

## 2015-04-21 NOTE — Progress Notes (Signed)
Site area: right brachial  Site Prior to Removal:  Level 0  Pressure Applied For 15 MINUTES    Minutes Beginning at 1017  Manual:   Yes.    Patient Status During Pull:  stable  Post Pull brachial Site:  Level 0  Post Pull Instructions Given:  Yes.    Post Pull Pulses Present:  Yes.    Dressing Applied:  Yes.     Bedrest Begins: 5183  Comments:  Pt tolerated venous brachial sheath pull well. Site unremarkable. VSS.

## 2015-04-22 ENCOUNTER — Encounter (HOSPITAL_COMMUNITY): Payer: Self-pay | Admitting: Cardiovascular Disease

## 2015-04-22 MED FILL — Heparin Sodium (Porcine) 2 Unit/ML in Sodium Chloride 0.9%: INTRAMUSCULAR | Qty: 1500 | Status: AC

## 2015-04-28 ENCOUNTER — Encounter: Payer: Self-pay | Admitting: Family Medicine

## 2015-04-28 ENCOUNTER — Ambulatory Visit (INDEPENDENT_AMBULATORY_CARE_PROVIDER_SITE_OTHER): Payer: Medicare Other | Admitting: Family Medicine

## 2015-04-28 VITALS — BP 110/60 | HR 55 | Wt 151.0 lb

## 2015-04-28 DIAGNOSIS — I350 Nonrheumatic aortic (valve) stenosis: Secondary | ICD-10-CM

## 2015-04-28 NOTE — Progress Notes (Signed)
   Subjective:    Patient ID: Corey Oneill, male    DOB: [birth year], 79 y.o.   MRN: [id number]  HPI He is here for consultation concerning difficulty with midepigastric discomfort for the last day. He recently had a cardiac cath which did show aortic stenosis as well as some mild coronary disease. He was unsure what he was given but follow-up with the pharmacy indicates she was given Toprol 25 mg. He states that when walking the dog he would feel slight increased pressure that would goal a relatively quickly. He now notes the pressure/discomfort is occurring without physical activity. Food has no effect. No shortness of breath, diaphoresis or weakness.   Review of Systems     Objective:   Physical Exam Alert and in no distress. Cardiac exam does show a systolic murmur. Lungs are clear to auscultation.       Assessment & Plan:  Aortic stenosis He will increase his Toprol to 50 mg per day. I will touch bases with him on Monday to see how he is doing. If continued difficulty I will get him back into see cardiology.

## 2015-04-29 ENCOUNTER — Telehealth: Payer: Self-pay | Admitting: Family Medicine

## 2015-04-29 NOTE — Telephone Encounter (Signed)
Pt called and said Dr. Wynonia Lawman has him on Metoprolol 25 mg succ er

## 2015-04-29 NOTE — Telephone Encounter (Signed)
Patient informed and verbalized understanding

## 2015-04-29 NOTE — Telephone Encounter (Signed)
Have him take 2 of these per day.and have him check with [place] next Monday

## 2015-05-03 ENCOUNTER — Telehealth: Payer: Self-pay | Admitting: Internal Medicine

## 2015-05-03 NOTE — Telephone Encounter (Signed)
Pt states he is not doing well. He said his legs feels like they are giving out. He is still having chest discomfort. And he just wanted you to know that. He sees Dr. Wynonia Lawman tomorrow at 11:30am

## 2015-05-04 ENCOUNTER — Encounter: Payer: Self-pay | Admitting: Cardiology

## 2015-05-04 DIAGNOSIS — K219 Gastro-esophageal reflux disease without esophagitis: Secondary | ICD-10-CM | POA: Diagnosis not present

## 2015-05-04 DIAGNOSIS — I350 Nonrheumatic aortic (valve) stenosis: Secondary | ICD-10-CM | POA: Diagnosis not present

## 2015-05-04 DIAGNOSIS — I25118 Atherosclerotic heart disease of native coronary artery with other forms of angina pectoris: Secondary | ICD-10-CM | POA: Diagnosis not present

## 2015-05-04 DIAGNOSIS — I2510 Atherosclerotic heart disease of native coronary artery without angina pectoris: Secondary | ICD-10-CM | POA: Insufficient documentation

## 2015-05-04 DIAGNOSIS — R079 Chest pain, unspecified: Secondary | ICD-10-CM | POA: Diagnosis not present

## 2015-05-04 DIAGNOSIS — E785 Hyperlipidemia, unspecified: Secondary | ICD-10-CM | POA: Diagnosis not present

## 2015-05-04 DIAGNOSIS — R0602 Shortness of breath: Secondary | ICD-10-CM | POA: Diagnosis not present

## 2015-05-04 NOTE — Progress Notes (Unsigned)
Patient ID: TAESHAUN RAMES, male   DOB: 29-Oct-1936, 79 y.o.   MRN: [id number]   Hanad, Leino    Date of visit:  05/04/2015 DOB:  08-07-1936    Age:  79 yrs. Medical record number:  [id number]     Account number:  [id number] Primary Care Provider: Jill Alexanders C ____________________________ CURRENT DIAGNOSES  1. CAD native with angina  2. Aortic valve stenosis  3. Dyspnea  4. Hyperlipidemia  5. Gastro-esophageal reflux disease ____________________________ ALLERGIES  No Known Allergies ____________________________ MEDICATIONS  1. Vitamin B-12 1,000 mcg tablet, 6000 mcg qd  2. aspirin 325 mg tablet, 1 p.o. daily  3. magnesium 250 mg tablet, 1 p.o. daily  4. simvastatin 40 mg tablet, QOD  5. metoprolol succinate ER 25 mg tablet,extended release 24 hr, 2 p.o. daily  6. fluoxetine 10 mg capsule, 1 p.o. daily  7. omeprazole 20 mg tablet,delayed release, 1 p.o. daily  8. bupropion HCl 100 mg tablet, 1 p.o. daily  9. isosorbide mononitrate ER 30 mg tablet,extended release 24 hr, 1 p.o. daily  10. metoprolol succinate ER 50 mg tablet,extended release 24 hr, 1 p.o. daily ____________________________ CHIEF COMPLAINTS  Followup of Chest pain, unspecified ____________________________ HISTORY OF PRESENT ILLNESS Patient returns for cardiac followup. He underwent cardiac catheterization that showed only mild to moderate aortic stenosis and he had some coronary artery disease with moderately severe in an intermediate branch and mild to moderate in the circumflex and the RCA. He continues to have exertional chest discomfort with walking up hills. His digestive system has settled down but he is concerned about the amount of discomfort that he has when he does things. Dr. Antionette Char initial recommendations were for medical therapy. He evidently did not have significant pulmonary hypertension at that time. He denies PND orthopnea and does not have rest pain. ____________________________ PAST HISTORY  Past  Medical Illnesses:  prostate cancer treated with external beam radiation 2014, depression, hyperlipidemia, GERD;  Cardiovascular Illnesses:  CAD, aortic stenosis;  Surgical Procedures:  hernia repair, tonsillectomy, nasal surgeries;  NYHA Classification:  I;  Canadian Angina Classification:  Class 0: Asymptomatic;  Cardiology Procedures-Invasive:  cardiac cath (left) June 2016;  Cardiology Procedures-Noninvasive:  treadmill, echocardiogram April 2016;  Cardiac Cath Results:  mild to moderate AS, 70% intermediate stenosis, 40% RCA and Circ stenosis;  LVEF of 60% documented via echocardiogram on 02/22/2015,   ____________________________ FAMILY HISTORY Father -- Father dead, Cancer Mother -- Mother dead, Malignant lymphoma Sister -- Sister alive with problem, Dementia/Alzheimers ____________________________ SOCIAL HISTORY Alcohol Use:  no alcohol use;  Smoking:  used to smoke but quit Prior to 1980;  Diet:  regular diet;  Lifestyle:  married;  Exercise:  walking;  Occupation:  Dealer;  Residence:  lives with wife;   ____________________________ REVIEW OF SYSTEMS General:  no change in weight  Integumentary:no rashes or new skin lesions. Eyes: wears eye glasses/contact lenses Respiratory: dyspnea with exertion Cardiovascular:  please review HPI Abdominal: radiation proctitis with diarrhea and bleeding Genitourinary-Male: nocturia  Musculoskeletal:  denies arthritis, venous insufficiency, or muscle weakness  ____________________________ PHYSICAL EXAMINATION VITAL SIGNS  Blood Pressure:  94/60 Sitting, Right arm, regular cuff  , 100/60 Standing, Right arm and regular cuff   Pulse:  60/min. Weight:  152.00 lbs. Height:  72"BMI: 20  Constitutional:  pleasant white male in no acute distress Skin:  multiple sebaceous keratoses Head:  normocephalic, normal hair pattern, no masses or tenderness Neck:  supple, without massess. No JVD, thyromegaly or carotid bruits.  Carotid  upstroke normal. Chest:  normal symmetry, clear to auscultation. Cardiac:  regular rhythm, normal S1 and S2, no S3 or S4, grade 2/6 systolic murmur at aortic area radiating to neck Peripheral Pulses:  the femoral,dorsalis pedis, and posterior tibial pulses are full and equal bilaterally with no bruits auscultated. Extremities & Back:  no deformities, clubbing, cyanosis, erythema or edema observed. Normal muscle strength and tone. Neurological:  no gross motor or sensory deficits noted, affect appropriate, oriented x3. ___________________________ IMPRESSIONS/PLAN  1. Continued limiting exertional angina in a patient with moderate aortic stenosis and also coronary artery disease 2. CAD with disease in an intermediate branch 3. Hyperlipidemia  Recommendations:  Long discussion about symptoms-recommended trial of medical therapy and cardiac rehabilitation. We'll add isosorbide mononitrate 30 mg to regimen and titrate this up. Get involved in cardiac rehabilitation. Increase beta blocker to metoprolol extended release 50 mg daily. Followup in 6 weeks. May consider re look at stenting of the vessel if he continues to be symptomatic despite medical treatment. ____________________________ TODAYS ORDERS  1. Return Visit: 6 weeks  2. Cardiac Rehab:                        ____________________________ Cardiology Physician:  Kerry Hough MD [facility]

## 2015-05-20 ENCOUNTER — Encounter (HOSPITAL_COMMUNITY)
Admission: RE | Admit: 2015-05-20 | Discharge: 2015-05-20 | Disposition: A | Discharge status: Home / Self Care | Payer: Medicare Other | Source: Ambulatory Visit | Attending: Cardiology | Admitting: Cardiology

## 2015-05-20 DIAGNOSIS — I209 Angina pectoris, unspecified: Secondary | ICD-10-CM | POA: Insufficient documentation

## 2015-05-20 NOTE — Progress Notes (Signed)
Cardiac Rehab Medication Review by a Pharmacist  Does the patient  feel that his/her medications are working for him/her?  Yes -overall but omeprazole may not be working the way he would like  Has the patient been experiencing any side effects to the medications prescribed?  no  Does the patient measure his/her own blood pressure or blood glucose at home?  no   Does the patient have any problems obtaining medications due to transportation or finances?   no  Understanding of regimen: good Understanding of indications: good Potential of compliance: good    Pharmacist comments: Pt reports dizziness at first but hassince resolved no major issue feels his omperazole works some day but not others. No other issues.  Darl Pikes, PharmD Pharmacy Resident

## 2015-05-24 ENCOUNTER — Encounter (HOSPITAL_COMMUNITY)
Admission: RE | Admit: 2015-05-24 | Discharge: 2015-05-24 | Disposition: A | Discharge status: Home / Self Care | Payer: Medicare Other | Source: Ambulatory Visit | Attending: Cardiology | Admitting: Cardiology

## 2015-05-24 DIAGNOSIS — I209 Angina pectoris, unspecified: Secondary | ICD-10-CM | POA: Diagnosis not present

## 2015-05-24 NOTE — Progress Notes (Signed)
Pt started cardiac rehab today.  Pt tolerated light exercise without difficulty. VSS with low bp noted.  Pt asymptomatic and history from office notes show low bp.  Although pt is asymptomatic he does worry about the low reading. Telemetry-SR with no ectopy noted, asymptomatic.  Medication list reconciled.  Pt verbalized compliance with medications and denies barriers to compliance. Pt does not have order for sublingual NTG.  PSYCHOSOCIAL ASSESSMENT:  PHQ-1.  Pt presently takes two medications for depression. Pt is making the best of the situation he is in with his wife who he divorced several years ago and has since remarried. Pt exhibits positive coping skills, hopeful outlook and has resigned himself to acceptance of his support system.  Pt describes his support system is friend, oldest daughter then wife.No psychosocial needs identified at this time, no psychosocial interventions necessary but due to his history of depression will periodically check in with pt for overall well being.    Pt enjoys work - he is self employed in [employer]. Pt works because financially he still needs to do maintain bills.  He admits that he enjoys what he is doing and doesn't mind working.  Pt likes auto racing.   Pt cardiac rehab  goal is  to increase stamina.  Pt feels he fatigues too easily and would like to work on building his endurance and stamina.  Home exercise will be key in the success of achieving this goal.  Will monitor MET level progression.   Pt long term cardiac rehab goal is increase speed and distance with walking.Pt enjoys walking and averages about a mile a day.  Pt would like to a,mublate further.  Pt remarks that he only walks the mile because that is about how far the "dog" can walk.  Encourage pt to continue to walk the dog but also begin doing walks by himself in order to achieve this goal.  Encourage pt to attend the education classes as well as the nutrition classes on tuesdays.  Pt strongly  advised to please let rehab staff know of any angina like symptoms.  Will send rehab report over to Dr. Wynonia Lawman for review of bp and pt concern.   Pt oriented to exercise equipment and routine.  Understanding verbalized. Cherre Huger, BSN

## 2015-05-26 ENCOUNTER — Encounter (HOSPITAL_COMMUNITY)
Admission: RE | Admit: 2015-05-26 | Discharge: 2015-05-26 | Disposition: A | Discharge status: Home / Self Care | Payer: Medicare Other | Source: Ambulatory Visit | Attending: Cardiology | Admitting: Cardiology

## 2015-05-26 DIAGNOSIS — I209 Angina pectoris, unspecified: Secondary | ICD-10-CM | POA: Diagnosis not present

## 2015-05-28 ENCOUNTER — Encounter (HOSPITAL_COMMUNITY)
Admission: RE | Admit: 2015-05-28 | Discharge: 2015-05-28 | Disposition: A | Discharge status: Home / Self Care | Payer: Medicare Other | Source: Ambulatory Visit | Attending: Cardiology | Admitting: Cardiology

## 2015-05-28 DIAGNOSIS — I209 Angina pectoris, unspecified: Secondary | ICD-10-CM | POA: Diagnosis not present

## 2015-05-28 NOTE — Progress Notes (Signed)
Pt arrived at cardiac rehab BP: 94/60  .  Pt reports he had not eaten breakfast this am, but drank coffee and orange juice.  Pt given a banana.  Recheck BP:  118/66.  Pt instructed importance of eating prior to exercise.  Understanding verbalized.

## 2015-05-31 ENCOUNTER — Encounter (HOSPITAL_COMMUNITY)
Admission: RE | Admit: 2015-05-31 | Discharge: 2015-05-31 | Disposition: A | Discharge status: Home / Self Care | Payer: Medicare Other | Source: Ambulatory Visit | Attending: Cardiology | Admitting: Cardiology

## 2015-05-31 DIAGNOSIS — I209 Angina pectoris, unspecified: Secondary | ICD-10-CM | POA: Diagnosis not present

## 2015-05-31 NOTE — Progress Notes (Signed)
Reviewed home exercise with pt today.  Pt plans to walk for exercise, 5-7 days a week.  Reviewed THR, pulse, RPE, sign and symptoms, NTG use, and when to call 911 or MD.  Pt voiced understanding.    [employer], ACSM RCEP

## 2015-06-01 DIAGNOSIS — I25118 Atherosclerotic heart disease of native coronary artery with other forms of angina pectoris: Secondary | ICD-10-CM | POA: Diagnosis not present

## 2015-06-01 DIAGNOSIS — I350 Nonrheumatic aortic (valve) stenosis: Secondary | ICD-10-CM | POA: Diagnosis not present

## 2015-06-01 DIAGNOSIS — R42 Dizziness and giddiness: Secondary | ICD-10-CM | POA: Diagnosis not present

## 2015-06-01 DIAGNOSIS — E785 Hyperlipidemia, unspecified: Secondary | ICD-10-CM | POA: Diagnosis not present

## 2015-06-01 DIAGNOSIS — R0602 Shortness of breath: Secondary | ICD-10-CM | POA: Diagnosis not present

## 2015-06-01 DIAGNOSIS — K219 Gastro-esophageal reflux disease without esophagitis: Secondary | ICD-10-CM | POA: Diagnosis not present

## 2015-06-02 ENCOUNTER — Encounter (HOSPITAL_COMMUNITY)
Admission: RE | Admit: 2015-06-02 | Discharge: 2015-06-02 | Disposition: A | Discharge status: Home / Self Care | Payer: Medicare Other | Source: Ambulatory Visit | Attending: Cardiology | Admitting: Cardiology

## 2015-06-02 DIAGNOSIS — I209 Angina pectoris, unspecified: Secondary | ICD-10-CM | POA: Diagnosis not present

## 2015-06-02 NOTE — Progress Notes (Signed)
Corey Oneill 79 y.o. male Nutrition Note Spoke with pt.  Nutrition Survey reviewed with pt. Pt is following Step 2 of the Therapeutic Lifestyle Changes diet. Pt wants to gain "a few pounds." Per discussion, pt would like to gain 10-15 lbs. Pt has been trying to gain wt by drinking a Boost daily. Wt gain tips briefly reviewed. Pt expressed understanding of the information reviewed. Pt aware of nutrition education classes offered. No results found for: HGBA1C   Wt Readings from Last 3 Encounters:  05/20/15 152 lb 12.5 oz (69.3 kg)  04/28/15 151 lb (68.493 kg)  04/21/15 149 lb (67.586 kg)   Nutrition Diagnosis ? Food-and nutrition-related knowledge deficit related to lack of exposure to information as related to diagnosis of: ? CVD  Nutrition Intervention ? Benefits of adopting Therapeutic Lifestyle Changes discussed when Medficts reviewed. ? Pt to attend the Portion Distortion class ? Pt given handouts for: ? Nutrition I class ? Nutrition II class ? Continue client-centered nutrition education by RD, as part of interdisciplinary care.  Goal(s) ? Pt to identify food quantities necessary to achieve: ? wt gain at graduation from cardiac rehab.  ? Pt to describe the benefit of including fruits, vegetables, whole grains, and low-fat dairy products in a heart healthy meal plan.  Monitor and Evaluate progress toward nutrition goal with team.  Derek Mound, M.Ed, RD, LDN, CDE 06/02/2015 7:54 AM

## 2015-06-03 ENCOUNTER — Encounter: Payer: Self-pay | Admitting: Family Medicine

## 2015-06-03 ENCOUNTER — Ambulatory Visit (INDEPENDENT_AMBULATORY_CARE_PROVIDER_SITE_OTHER): Payer: Medicare Other | Admitting: Family Medicine

## 2015-06-03 VITALS — BP 116/70 | HR 50 | Wt 154.0 lb

## 2015-06-03 DIAGNOSIS — N410 Acute prostatitis: Secondary | ICD-10-CM | POA: Diagnosis not present

## 2015-06-03 DIAGNOSIS — K627 Radiation proctitis: Secondary | ICD-10-CM | POA: Diagnosis not present

## 2015-06-03 DIAGNOSIS — I25118 Atherosclerotic heart disease of native coronary artery with other forms of angina pectoris: Secondary | ICD-10-CM

## 2015-06-03 DIAGNOSIS — C61 Malignant neoplasm of prostate: Secondary | ICD-10-CM

## 2015-06-03 DIAGNOSIS — K625 Hemorrhage of anus and rectum: Secondary | ICD-10-CM

## 2015-06-03 LAB — HEMOCCULT GUIAC POC 1CARD (OFFICE)

## 2015-06-03 LAB — POCT URINALYSIS DIPSTICK
BILIRUBIN UA: NEGATIVE
Glucose, UA: NEGATIVE
KETONES UA: NEGATIVE
Leukocytes, UA: NEGATIVE
Nitrite, UA: NEGATIVE
PROTEIN UA: NEGATIVE
Spec Grav, UA: 1.015
Urobilinogen, UA: NEGATIVE
pH, UA: 7

## 2015-06-03 MED ORDER — DOXYCYCLINE HYCLATE 100 MG PO TABS: 100.0000 mg | ORAL_TABLET | Freq: Two times a day (BID) | ORAL | Status: DC

## 2015-06-03 NOTE — Progress Notes (Signed)
   Subjective:    Patient ID: Corey Oneill, male    DOB: 07/05/1936, 79 y.o.   MRN: [id number]  HPI He is here for consult concerning urinary frequency and dysuria. He has had difficulty with the frequency for quite some time. In the last 6 weeks he is also noted dysuria especially at the end of urination. He has a previous history of prostate cancer with radiation and has had difficulty with proctitis. Since her radiation he has also had stool leakage and does use adult diapers to help with this. He is followed by urology and his gastroenterologist. He has no fever, chills or abdominal pains with the urgency and dysuria.   Review of Systems     Objective:   Physical Exam Alert and in no distress. Rectal exam does show a boggy but nontender prostate. Dipstick did show 1+ blood. Microscopic showed scattered red cells.       Assessment & Plan:  Prostate cancer  Acute prostatitis - Plan: doxycycline (VIBRA-TABS) 100 MG tablet, Urinalysis Dipstick, CULTURE, URINE COMPREHENSIVE, CANCELED: CULTURE, URINE COMPREHENSIVE  Radiation proctitis  Hemorrhage of rectum and anus - Plan: POCT occult blood stool  his symptoms sound like an infection although the underlying prostate cancer and proctitis it would be prudent to get a culture as well as treat.

## 2015-06-04 ENCOUNTER — Encounter (HOSPITAL_COMMUNITY)
Admission: RE | Admit: 2015-06-04 | Discharge: 2015-06-04 | Disposition: A | Discharge status: Home / Self Care | Payer: Medicare Other | Source: Ambulatory Visit | Attending: Cardiology | Admitting: Cardiology

## 2015-06-04 DIAGNOSIS — I209 Angina pectoris, unspecified: Secondary | ICD-10-CM | POA: Diagnosis not present

## 2015-06-04 NOTE — Progress Notes (Signed)
QUALITY OF LIFE SCORE REVIEW Patient completed quality of life survey as a participant in Cardiac Rehab. Patient score low in all areas  with the following scores overall 18.83, health and function 17.86, socioeconomic 20.79. Psychological and Spiritual 17.50; family 20.40.  Scores less than 21 are considered low.  Patient quality of life slightly altered by physical constraints which limits ability to perform as prior to recent cardiac illness.  Pt presently takes two anti depressant medications that he has used for many years.  Pt feels at this point in his life things are as well as they can be. Pt declined offer to forward results to primary MD or the need for spiritual care services her at the hospital..  Offered emotional support and reassurance.  Will continue to monitor and intervene as necessary.  Cherre Huger, BSN

## 2015-06-05 LAB — CULTURE, URINE COMPREHENSIVE
COLONY COUNT: NO GROWTH
Organism ID, Bacteria: NO GROWTH

## 2015-06-07 ENCOUNTER — Encounter (HOSPITAL_COMMUNITY)
Admission: RE | Admit: 2015-06-07 | Discharge: 2015-06-07 | Disposition: A | Discharge status: Home / Self Care | Payer: Medicare Other | Source: Ambulatory Visit | Attending: Cardiology | Admitting: Cardiology

## 2015-06-07 DIAGNOSIS — I209 Angina pectoris, unspecified: Secondary | ICD-10-CM | POA: Diagnosis not present

## 2015-06-09 ENCOUNTER — Encounter (HOSPITAL_COMMUNITY)
Admission: RE | Admit: 2015-06-09 | Discharge: 2015-06-09 | Disposition: A | Discharge status: Home / Self Care | Payer: Medicare Other | Source: Ambulatory Visit | Attending: Cardiology | Admitting: Cardiology

## 2015-06-09 DIAGNOSIS — I209 Angina pectoris, unspecified: Secondary | ICD-10-CM | POA: Diagnosis not present

## 2015-06-11 ENCOUNTER — Encounter (HOSPITAL_COMMUNITY)
Admission: RE | Admit: 2015-06-11 | Discharge: 2015-06-11 | Disposition: A | Discharge status: Home / Self Care | Payer: Medicare Other | Source: Ambulatory Visit | Attending: Cardiology | Admitting: Cardiology

## 2015-06-11 DIAGNOSIS — I209 Angina pectoris, unspecified: Secondary | ICD-10-CM | POA: Diagnosis not present

## 2015-06-14 ENCOUNTER — Encounter (HOSPITAL_COMMUNITY)
Admission: RE | Admit: 2015-06-14 | Discharge: 2015-06-14 | Disposition: A | Discharge status: Home / Self Care | Payer: Medicare Other | Source: Ambulatory Visit | Attending: Cardiology | Admitting: Cardiology

## 2015-06-14 ENCOUNTER — Encounter: Payer: Self-pay | Admitting: Cardiology

## 2015-06-14 DIAGNOSIS — K219 Gastro-esophageal reflux disease without esophagitis: Secondary | ICD-10-CM | POA: Diagnosis not present

## 2015-06-14 DIAGNOSIS — I209 Angina pectoris, unspecified: Secondary | ICD-10-CM | POA: Diagnosis not present

## 2015-06-14 DIAGNOSIS — R42 Dizziness and giddiness: Secondary | ICD-10-CM | POA: Diagnosis not present

## 2015-06-14 DIAGNOSIS — R079 Chest pain, unspecified: Secondary | ICD-10-CM | POA: Diagnosis not present

## 2015-06-14 DIAGNOSIS — E785 Hyperlipidemia, unspecified: Secondary | ICD-10-CM | POA: Diagnosis not present

## 2015-06-14 DIAGNOSIS — R0602 Shortness of breath: Secondary | ICD-10-CM | POA: Diagnosis not present

## 2015-06-14 DIAGNOSIS — I350 Nonrheumatic aortic (valve) stenosis: Secondary | ICD-10-CM | POA: Diagnosis not present

## 2015-06-14 DIAGNOSIS — I25118 Atherosclerotic heart disease of native coronary artery with other forms of angina pectoris: Secondary | ICD-10-CM | POA: Diagnosis not present

## 2015-06-14 NOTE — Progress Notes (Signed)
Patient ID: Corey Oneill, male   DOB: Mar 19, 1936, 79 y.o.   MRN: [id number]   Corey, Oneill    Date of visit:  06/14/2015 DOB:  1936-02-08    Age:  79 yrs. Medical record number:  [id number]     Account number:  [id number] Primary Care Provider: Jill Alexanders C ____________________________ CURRENT DIAGNOSES  1. CAD native with angina  2. Aortic valve stenosis  3. Dyspnea  4. Gastro-esophageal reflux disease  5. Hyperlipidemia  6. Chest pain  7. Dizziness and giddiness ____________________________ ALLERGIES  No Known Allergies ____________________________ MEDICATIONS  1. Vitamin B-12 1,000 mcg tablet, 6000 mcg qd  2. aspirin 325 mg tablet, 1 p.o. daily  3. magnesium 250 mg tablet, 1 p.o. daily  4. simvastatin 40 mg tablet, QOD  5. fluoxetine 10 mg capsule, 1 p.o. daily  6. omeprazole 20 mg tablet,delayed release, 1 p.o. daily  7. bupropion HCl 100 mg tablet, 1 p.o. daily  8. metoprolol succinate ER 50 mg tablet,extended release 24 hr, 1/2 tab daily  9. nitroglycerin 0.4 mg sublingual tablet, Take as directed ____________________________ CHIEF COMPLAINTS  Followup of CAD native with angina  Followup of Dyspnea ____________________________ HISTORY OF PRESENT ILLNESS Patient returns early for cardiac followup. This past Friday he had prolonged chest discomfort it lasted better part of the day. He had recurrent chest discomfort while exercising and also while walking and to rehabilitation. He had an occasional episode of dizziness when he got off the exercise bike. He has been worried about his symptoms as well as exercise intolerance and still has difficulty with his walking. He denies PND, orthopnea or claudication. He wanted to be seen today because of the episode of chest discomfort that occurred while walking into rehabilitation as well as the prolonged episode last week. ____________________________ PAST HISTORY  Past Medical Illnesses:  prostate cancer treated with external beam  radiation 2014, depression, hyperlipidemia, GERD;  Cardiovascular Illnesses:  CAD, aortic stenosis;  Surgical Procedures:  hernia repair, tonsillectomy, nasal surgeries;  NYHA Classification:  I;  Canadian Angina Classification:  Class 0: Asymptomatic;  Cardiology Procedures-Invasive:  cardiac cath (left) June 2016;  Cardiology Procedures-Noninvasive:  treadmill, echocardiogram April 2016;  Cardiac Cath Results:  mild to moderate AS, 70% intermediate stenosis, 40% RCA and Circ stenosis;  LVEF of 60% documented via echocardiogram on 02/22/2015,   ____________________________ CARDIO-PULMONARY TEST DATES EKG Date:  06/01/2015;  Echocardiography Date: 02/22/2015;  Chest Xray Date: 02/01/2015;   ____________________________ FAMILY HISTORY Father -- Father dead, Cancer Mother -- Mother dead, Malignant lymphoma Sister -- Sister alive with problem, Dementia/Alzheimers ____________________________ SOCIAL HISTORY Alcohol Use:  no alcohol use;  Smoking:  used to smoke but quit Prior to 1980;  Diet:  regular diet;  Lifestyle:  married;  Exercise:  walking;  Occupation:  Dealer;  Residence:  lives with wife;   ____________________________ REVIEW OF SYSTEMS General:  no change in weight  Integumentary:no rashes or new skin lesions. Eyes: wears eye glasses/contact lenses Respiratory: dyspnea with exertion Cardiovascular:  please review HPI Abdominal: radiation proctitis with diarrhea and bleeding Genitourinary-Male: nocturia  Musculoskeletal:  denies arthritis, venous insufficiency, or muscle weakness  ____________________________ PHYSICAL EXAMINATION VITAL SIGNS  Blood Pressure:  122/78 Standing, Left arm, regular cuff  , 112/68 Sitting, Left arm and regular cuff   Pulse:  70/min. Weight:  152.00 lbs. Height:  72"BMI: 20  Constitutional:  pleasant white male in no acute distress Skin:  multiple sebaceous keratoses Head:  normocephalic, normal hair pattern, no masses  or  tenderness Neck:  supple, without massess. No JVD, thyromegaly or carotid bruits. Carotid upstroke normal. Chest:  normal symmetry, clear to auscultation. Cardiac:  regular rhythm, normal S1 and S2, no S3 or S4, grade 2/6 systolic murmur at aortic area radiating to neck Peripheral Pulses:  the femoral,dorsalis pedis, and posterior tibial pulses are full and equal bilaterally with no bruits auscultated. Extremities & Back:  no deformities, clubbing, cyanosis, erythema or edema observed. Normal muscle strength and tone. Neurological:  no gross motor or sensory deficits noted, affect appropriate, oriented x3. ____________________________ MOST RECENT LIPID PANEL 03/22/15    Cholesterol, Total 169 mg/dl   LDL-Cholesterol 101 NM   HDL-Cholesterol 58 mg/dl   Triglycerides 51 mg/dl   Total Cholesterol/HDL Ratio 2.9 (Calc) ____________________________ IMPRESSIONS/PLAN  1. Continued angina in a patient with moderately severe intermediate coronary disease and mild/moderate aortic stenosis 2. Coronary artery disease 3. Hyperlipidemia 4. Episodic dizziness with inability to tolerate medicines do to a low blood pressure  Recommendations:  Discussed with patient. His EKG is normal and exam was normal. He is having more angina and he is given a prescription for nitroglycerin. Medical therapy has been difficult because of dizziness associated with low blood pressure. I would like him to be seen by the interventional cardiologist for consideration of stenting and will have him see Dr. Burt Knack who did the cath on him. If he has worsening angina before Dr. Burt Knack returns then we'll need to get him seen for potential stenting. ____________________________ TODAYS ORDERS  1. Cardiology Consult [place]  2. 12 Lad EKG: Today            ____________________________  Cardiology Physician:  Kerry Hough MD [facility]

## 2015-06-16 ENCOUNTER — Encounter (HOSPITAL_COMMUNITY)
Admission: RE | Admit: 2015-06-16 | Discharge: 2015-06-16 | Disposition: A | Discharge status: Home / Self Care | Payer: Medicare Other | Source: Ambulatory Visit | Attending: Cardiology | Admitting: Cardiology

## 2015-06-16 ENCOUNTER — Telehealth (HOSPITAL_COMMUNITY): Payer: Self-pay | Admitting: *Deleted

## 2015-06-16 NOTE — Telephone Encounter (Signed)
Called and left message for Juliann Pulse, Dr. Sinclair Grooms nurse regarding participation in CR while awaiting stent placement.  Also fax over written note for input. Cherre Huger, BSN

## 2015-06-16 NOTE — Progress Notes (Signed)
Pt in for rehab 6:45 today and reported to rehab staff that he had an episode of nausea after bending over in a closet.  Pt reported further that on Monday he had chest discomfort on the stepper.  The level had not been changed from the previous station. This was unreported to the rehab staff.  Pt seen in office by Dr. Wynonia Lawman. Plan for stent placement by Burt Knack.  Pt unsure of when.  Advised pt not to exercise today until clarified with Dr. Wynonia Lawman when the office open. Cherre Huger, BSN

## 2015-06-17 ENCOUNTER — Emergency Department (HOSPITAL_COMMUNITY): Payer: Medicare Other

## 2015-06-17 ENCOUNTER — Encounter (HOSPITAL_COMMUNITY): Payer: Self-pay

## 2015-06-17 ENCOUNTER — Other Ambulatory Visit: Payer: Self-pay | Admitting: Family Medicine

## 2015-06-17 ENCOUNTER — Inpatient Hospital Stay (HOSPITAL_COMMUNITY)
Admission: EM | Admit: 2015-06-17 | Discharge: 2015-06-19 | DRG: 247 | Disposition: A | Discharge status: Home / Self Care | Payer: Medicare Other | Attending: Cardiology | Admitting: Cardiology

## 2015-06-17 ENCOUNTER — Other Ambulatory Visit: Payer: Self-pay

## 2015-06-17 ENCOUNTER — Telehealth (HOSPITAL_COMMUNITY): Payer: Self-pay | Admitting: *Deleted

## 2015-06-17 DIAGNOSIS — I25110 Atherosclerotic heart disease of native coronary artery with unstable angina pectoris: Secondary | ICD-10-CM | POA: Diagnosis not present

## 2015-06-17 DIAGNOSIS — I200 Unstable angina: Secondary | ICD-10-CM

## 2015-06-17 DIAGNOSIS — Z923 Personal history of irradiation: Secondary | ICD-10-CM

## 2015-06-17 DIAGNOSIS — I350 Nonrheumatic aortic (valve) stenosis: Secondary | ICD-10-CM | POA: Diagnosis present

## 2015-06-17 DIAGNOSIS — K627 Radiation proctitis: Secondary | ICD-10-CM | POA: Diagnosis present

## 2015-06-17 DIAGNOSIS — R42 Dizziness and giddiness: Secondary | ICD-10-CM | POA: Diagnosis present

## 2015-06-17 DIAGNOSIS — Z87891 Personal history of nicotine dependence: Secondary | ICD-10-CM

## 2015-06-17 DIAGNOSIS — R159 Full incontinence of feces: Secondary | ICD-10-CM | POA: Diagnosis present

## 2015-06-17 DIAGNOSIS — I2584 Coronary atherosclerosis due to calcified coronary lesion: Secondary | ICD-10-CM | POA: Diagnosis present

## 2015-06-17 DIAGNOSIS — Z79899 Other long term (current) drug therapy: Secondary | ICD-10-CM

## 2015-06-17 DIAGNOSIS — F329 Major depressive disorder, single episode, unspecified: Secondary | ICD-10-CM | POA: Diagnosis not present

## 2015-06-17 DIAGNOSIS — R0789 Other chest pain: Secondary | ICD-10-CM | POA: Diagnosis not present

## 2015-06-17 DIAGNOSIS — E785 Hyperlipidemia, unspecified: Secondary | ICD-10-CM | POA: Diagnosis not present

## 2015-06-17 DIAGNOSIS — I2510 Atherosclerotic heart disease of native coronary artery without angina pectoris: Secondary | ICD-10-CM | POA: Diagnosis present

## 2015-06-17 DIAGNOSIS — Z7982 Long term (current) use of aspirin: Secondary | ICD-10-CM

## 2015-06-17 DIAGNOSIS — K219 Gastro-esophageal reflux disease without esophagitis: Secondary | ICD-10-CM | POA: Diagnosis present

## 2015-06-17 DIAGNOSIS — I359 Nonrheumatic aortic valve disorder, unspecified: Secondary | ICD-10-CM | POA: Diagnosis not present

## 2015-06-17 HISTORY — DX: Atherosclerotic heart disease of native coronary artery without angina pectoris: I25.10

## 2015-06-17 LAB — BASIC METABOLIC PANEL
ANION GAP: 7 (ref 5–15)
BUN: 15 mg/dL (ref 6–20)
CHLORIDE: 99 mmol/L — AB (ref 101–111)
CO2: 29 mmol/L (ref 22–32)
Calcium: 9.1 mg/dL (ref 8.9–10.3)
Creatinine, Ser: 1 mg/dL (ref 0.61–1.24)
GFR calc Af Amer: >60 mL/min (ref >60)
GFR calc non Af Amer: >60 mL/min (ref >60)
Glucose, Bld: 96 mg/dL (ref 65–99)
POTASSIUM: 4.5 mmol/L (ref 3.5–5.1)
Sodium: 135 mmol/L (ref 135–145)

## 2015-06-17 LAB — I-STAT TROPONIN, ED: TROPONIN I, POC: 0 ng/mL (ref 0.00–0.08)

## 2015-06-17 LAB — CBC
HEMATOCRIT: 35.2 % — AB (ref 39.0–52.0)
HEMATOCRIT: 34.5 % — AB (ref 39.0–52.0)
HEMOGLOBIN: 11.3 g/dL — AB (ref 13.0–17.0)
Hemoglobin: 11.2 g/dL — ABNORMAL LOW (ref 13.0–17.0)
MCH: 26.3 pg (ref 26.0–34.0)
MCH: 26.4 pg (ref 26.0–34.0)
MCHC: 32.1 g/dL (ref 30.0–36.0)
MCHC: 32.5 g/dL (ref 30.0–36.0)
MCV: 82.1 fL (ref 78.0–100.0)
MCV: 81.2 fL (ref 78.0–100.0)
Platelets: 189 10*3/uL (ref 150–400)
Platelets: 165 10*3/uL (ref 150–400)
RBC: 4.29 MIL/uL (ref 4.22–5.81)
RBC: 4.25 MIL/uL (ref 4.22–5.81)
RDW: 16.2 % — ABNORMAL HIGH (ref 11.5–15.5)
RDW: 16.3 % — ABNORMAL HIGH (ref 11.5–15.5)
WBC: 6.5 10*3/uL (ref 4.0–10.5)
WBC: 5.3 10*3/uL (ref 4.0–10.5)

## 2015-06-17 LAB — CREATININE, SERUM: Creatinine, Ser: 0.97 mg/dL (ref 0.61–1.24)

## 2015-06-17 LAB — PROTIME-INR
INR: 1.24 (ref 0.00–1.49)
Prothrombin Time: 15.8 seconds — ABNORMAL HIGH (ref 11.6–15.2)

## 2015-06-17 LAB — TROPONIN I: Troponin I: <0.03 ng/mL (ref <0.031)

## 2015-06-17 MED ORDER — BUPROPION HCL ER (SR) 100 MG PO TB12
100.0000 mg | ORAL_TABLET | Freq: Two times a day (BID) | ORAL | Status: DC
  Administered 2015-06-17 – 2015-06-18 (×2): 100 mg via ORAL
  Filled 2015-06-17 (×3): qty 1

## 2015-06-17 MED ORDER — VITAMIN B-12 1000 MCG PO TABS
6000.0000 ug | ORAL_TABLET | Freq: Every day | ORAL | Status: DC
  Administered 2015-06-18: 6000 ug via ORAL
  Filled 2015-06-17 (×2): qty 6

## 2015-06-17 MED ORDER — FLUOXETINE HCL 10 MG PO CAPS
10.0000 mg | ORAL_CAPSULE | Freq: Every day | ORAL | Status: DC
  Administered 2015-06-18: 10 mg via ORAL
  Filled 2015-06-17 (×2): qty 1

## 2015-06-17 MED ORDER — SODIUM CHLORIDE 0.9 % WEIGHT BASED INFUSION
1.0000 mL/kg/h | INTRAVENOUS | Status: DC
  Administered 2015-06-18: 1 mL/kg/h via INTRAVENOUS

## 2015-06-17 MED ORDER — PANTOPRAZOLE SODIUM 40 MG PO TBEC
40.0000 mg | DELAYED_RELEASE_TABLET | Freq: Every day | ORAL | Status: DC
  Administered 2015-06-18: 40 mg via ORAL
  Filled 2015-06-17: qty 1

## 2015-06-17 MED ORDER — METOPROLOL SUCCINATE ER 25 MG PO TB24
25.0000 mg | ORAL_TABLET | Freq: Every day | ORAL | Status: DC
  Administered 2015-06-17 – 2015-06-18 (×2): 25 mg via ORAL
  Filled 2015-06-17 (×2): qty 1

## 2015-06-17 MED ORDER — ACETAMINOPHEN 325 MG PO TABS: 650.0000 mg | ORAL_TABLET | ORAL | Status: DC | PRN

## 2015-06-17 MED ORDER — SIMVASTATIN 40 MG PO TABS
40.0000 mg | ORAL_TABLET | ORAL | Status: DC
  Filled 2015-06-17: qty 1

## 2015-06-17 MED ORDER — SODIUM CHLORIDE 0.9 % IV SOLN: 250.0000 mL | INTRAVENOUS | Status: DC | PRN

## 2015-06-17 MED ORDER — ASPIRIN 81 MG PO CHEW
324.0000 mg | CHEWABLE_TABLET | Freq: Once | ORAL | Status: DC
  Filled 2015-06-17: qty 4

## 2015-06-17 MED ORDER — SODIUM CHLORIDE 0.9 % IJ SOLN: 3.0000 mL | INTRAMUSCULAR | Status: DC | PRN

## 2015-06-17 MED ORDER — ASPIRIN EC 81 MG PO TBEC
81.0000 mg | DELAYED_RELEASE_TABLET | Freq: Every day | ORAL | Status: DC
  Administered 2015-06-18: 81 mg via ORAL
  Filled 2015-06-17: qty 1

## 2015-06-17 MED ORDER — NITROGLYCERIN 0.4 MG SL SUBL: 0.4000 mg | SUBLINGUAL_TABLET | SUBLINGUAL | Status: DC | PRN

## 2015-06-17 MED ORDER — SODIUM CHLORIDE 0.9 % WEIGHT BASED INFUSION: 3.0000 mL/kg/h | INTRAVENOUS | Status: DC

## 2015-06-17 MED ORDER — SODIUM CHLORIDE 0.9 % IJ SOLN
3.0000 mL | Freq: Two times a day (BID) | INTRAMUSCULAR | Status: DC
Start: 2015-06-17 — End: 2015-06-18
  Administered 2015-06-17: 10 mL via INTRAVENOUS
  Administered 2015-06-18: 3 mL via INTRAVENOUS

## 2015-06-17 MED ORDER — ONDANSETRON HCL 4 MG/2ML IJ SOLN
4.0000 mg | Freq: Four times a day (QID) | INTRAMUSCULAR | Status: DC | PRN
  Administered 2015-06-19: 4 mg via INTRAVENOUS
  Filled 2015-06-17: qty 2

## 2015-06-17 MED ORDER — ASPIRIN 81 MG PO CHEW
81.0000 mg | CHEWABLE_TABLET | ORAL | Status: AC
  Administered 2015-06-18: 81 mg via ORAL
  Filled 2015-06-17: qty 1

## 2015-06-17 MED ORDER — ENOXAPARIN SODIUM 40 MG/0.4ML ~~LOC~~ SOLN
40.0000 mg | SUBCUTANEOUS | Status: DC
  Administered 2015-06-17: 40 mg via SUBCUTANEOUS
  Filled 2015-06-17 (×2): qty 0.4

## 2015-06-17 MED ORDER — ASPIRIN 81 MG PO CHEW
243.0000 mg | CHEWABLE_TABLET | Freq: Once | ORAL | Status: AC
  Administered 2015-06-17: 243 mg via ORAL

## 2015-06-17 NOTE — ED Notes (Signed)
Patient to be taken upstairs by [name], [place]

## 2015-06-17 NOTE — Telephone Encounter (Signed)
Is this okay?

## 2015-06-17 NOTE — ED Notes (Signed)
Pt reports chest "discomfort" for several months. Pt was sent here by dr Wynonia Lawman for eval. Pt has been in cardiac rehab. Pt states that this pain is worse with exertion.

## 2015-06-17 NOTE — ED Notes (Signed)
MD at bedside.cardiology  

## 2015-06-17 NOTE — Progress Notes (Signed)
PATIENT ARRIVED TO UNIT 2W FROM E.D. VIA STRETCHER. AMBULATED TO BED. TELE APPLIED. VITALS AND WEIGHT OBTAINED. ASSESSMENT PERFORMED.  PATIENT INSTRUCTED ON USE OF CALLBELL AND FALL RISK SAFETY.  WIFE PRESENT. HAS COLLECTED PATIENT'S JEWELRY--RING AND WRISTWATCH.

## 2015-06-17 NOTE — H&P (Signed)
History and Physical   Admit date: 06/17/2015 Name:  Corey Oneill Medical record number: [id number] DOB/Age:  1936-06-19  79 y.o. male  Referring Physician:   [facility]  Primary Cardiologist:  Dr. Wynonia Lawman  Primary Physician:   Dr. Redmond School   Chief complaint/reason for admission: Chest pain  HPI:  This 79 year old male developed exertional angina about 6 months ago.  His echocardiogram in the office in March showed moderate severe aortic stenosis with a mean aortic valve grading of 33 mmHg.  He has had occasional episodes of dizziness but no syncope.  He walked on the treadmill and did not have angina but was stopped with poor exercise duration.  Dr. Burt Knack did catheterization but only found a mean gradient of around 23 mm and recommended medical therapy.  There is also stenosis in a marginal branch that had the septal perforator coming off of it estimated at no more than 70%.  He was tried initially on medical therapy with an increase in his metoprolol to 50 mg.  When I saw him in the office he had isosorbide added to his regimen in June and was placed in cardiac rehabilitation.  He had some dizziness in cardiac rehabilitation and somewhat low blood pressure and did not tolerate the isosorbide.  He continues to have difficulty with angina when he walks his dog and had a prolonged episode of chest discomfort while exercising and mild walking to rehabilitation.  He was seen earlier this week because the episode of pain and actually had pain in rehabilitation.  He has also complained of significant dizziness.  He had angina again on Wednesday and talked to them in cardiac rehabilitation and I advised him not to go to rehabilitation until things were taken care of.  Dr. Burt Knack was out of town and I spoke to Dr. Tamala Julian who reviewed the films and did not see anything he felt he would be a candidate for intervention.  The patient had prolonged chest discomfort today after walking his dog  associated with nausea and was almost ready to take a nitroglycerin.  He was called back by somebody from rehabilitation and the rehabilitation person advised him to come to the emergency room.  He is currently pain-free and initial troponin is negative.  He also has unacceptable exercise symptoms and states that he can't walk his daughter do other activities without angina.  Titration of beta blockers is limited due to dizziness.  He has felt bad on isosorbide previously.  He also has significant radiation proctitis and has to wear depends and has episodic stool incontinence as well as some intermittent bleeding in his rectal area.    Past Medical History  Diagnosis Date  . Hyperlipidemia   . Esophageal reflux   . Prostate cancer   . Hx of radiation therapy 04/14/13-06/09/13    prostate 7800 cGy, 40 sessions, seminal vesicles 5600 cGy 40 sessions  . Exertional angina 04/21/2015     Past Surgical History  Procedure Laterality Date  . Nose surgery    . Tonsillectomy    . Needle biopsy of prostate    . Colonoscopy    . Prostate surgery    . Cardiac catheterization N/A 04/21/2015    Procedure: Right/Left Heart Cath and Coronary Angiography;  Surgeon: [name], MD;  Location: [place] [facility];  Service: Cardiovascular;  Laterality: N/A;  .  Allergies: has No Known Allergies.   Medications: Prior to Admission medications   Medication Sig Start Date End Date  Taking? Authorizing Provider  aspirin 81 MG tablet Take 81 mg by mouth daily.   Yes Historical Provider, MD  buPROPion (WELLBUTRIN SR) 100 MG 12 hr tablet TAKE 1 TABLET TWICE DAILY. 06/17/15  Yes [name], MD  FLUoxetine (PROZAC) 10 MG capsule Take 1 capsule (10 mg total) by mouth daily. 03/22/15  Yes [name], MD  Magnesium 250 MG TABS Take 1 tablet by mouth daily.   Yes Historical Provider, MD  metoprolol succinate (TOPROL-XL) 50 MG 24 hr tablet Take 25 mg by mouth daily. Take with or immediately following a meal.(Per  Dr.Tilley pt is to take 1/2 a pill daily)   Yes Historical Provider, MD  omeprazole (PRILOSEC) 20 MG capsule Take 20 mg by mouth daily.   Yes Historical Provider, MD  simvastatin (ZOCOR) 40 MG tablet Take 40 mg by mouth every other day.    Yes Historical Provider, MD  vitamin B-12 (CYANOCOBALAMIN) 1000 MCG tablet Take 1,000 mcg by mouth See admin instructions. Take 6,036mcg daily per patient   Yes Historical Provider, MD  doxycycline (VIBRA-TABS) 100 MG tablet Take 1 tablet (100 mg total) by mouth 2 (two) times daily. Patient not taking: Reported on 06/17/2015 06/03/15   [name], MD    Family History:  Family Status  Relation Status Death Age  . Father Deceased 84  . Mother Deceased 73    Social History:   reports that he quit smoking about 47 years ago. He has never used smokeless tobacco. He reports that he drinks about 7.5 oz of alcohol per week. He reports that he does not use illicit drugs.   History   Social History Narrative     Review of Systems: Other than as noted above, the remainder of the review of systems is normal  Physical Exam: BP 144/73 mmHg  Pulse 65  Temp(Src) 97.2 F (36.2 C) (Oral)  Resp 13  Ht 6' (1.829 m)  Wt 68.493 kg (151 lb)  BMI 20.47 kg/m2  SpO2 100% General appearance: Pleasant male in no acute distress, anxious Head: Normocephalic, without obvious abnormality, atraumatic Eyes: conjunctivae/corneas clear. PERRL, EOM's intact. Fundi not examined  Neck: no adenopathy, no carotid bruit, no JVD and supple, symmetrical, trachea midline Lungs: clear to auscultation bilaterally Heart: Regular rate and rhythm, normal S1 and S2, no S3, 2/6 faint systolic murmur across the aortic valve Abdomen: soft, non-tender; bowel sounds normal; no masses,  no organomegaly Rectal: deferred Extremities: extremities normal, atraumatic, no cyanosis or edema Pulses: 2+ and symmetric Skin: Skin color, texture, turgor normal. No rashes or lesions Neurologic:  Grossly normal  Labs: CBC  Recent Labs  06/17/15 1538  WBC 6.5  RBC 4.29  HGB 11.3*  HCT 35.2*  PLT 189  MCV 82.1  MCH 26.3  MCHC 32.1  RDW 16.2*   CMP   Recent Labs  06/17/15 1538  NA 135  K 4.5  CL 99*  CO2 29  GLUCOSE 96  BUN 15  CREATININE 1.00  CALCIUM 9.1  GFRNONAA >60  GFRAA >60   Cardiac Panel (last 3 results) Troponin (Point of Care Test)  Recent Labs  06/17/15 1547  TROPIPOC 0.00   Thyroid  Lab Results  Component Value Date   TSH 2.42 07/04/2007    EKG: Normal  Radiology: No acute cardiopulmonary issues   IMPRESSIONS: 1.  Progressive exertional angina in a patient with moderate coronary artery disease and moderate aortic stenosis-symptoms not acceptably controlled to patient 2.  History of prostate  cancer treated with radiation therapy 3.  Esophageal reflux 3.  Depression  RECOMMENDATIONS: Patient has been very difficult to manage as an outpatient with virtually daily episodes of exertional angina and frequent phone calls.  His films were reviewed with Dr. Tamala Julian who was not sure there was anything definite to be stented but he continues to have significant angina with exertion despite medical therapy.  Titration of medical therapy has been difficult because of dizziness and blood pressure issues.  He also has aortic stenosis.  At this time he will be admitted and will plan repeat catheterization by one of the [facility] interventionalists.  Would like to reassess the gradient across the aortic valve and if it is unchanged then consider flow wire of the obtuse marginal artery stenosis if a culprit lesion is Not identified.  The patient is in agreement with the plan.  Cardiac catheterization was discussed with the patient fully including risks of myocardial infarction, death, stroke, bleeding, arrhythmia, dye allergy, renal insufficiency or bleeding.  The patient understands and is willing to proceed.   Signed: Kerry Hough MD  [facility] Cardiology  06/17/2015, 6:34 PM

## 2015-06-17 NOTE — ED Provider Notes (Signed)
CSN: [id number]     Arrival date & time 06/17/15  1518 History   First MD Initiated Contact with Patient 06/17/15 1657     Chief Complaint  Patient presents with  . Chest Pain     (Consider location/radiation/quality/duration/timing/severity/associated sxs/prior Treatment) HPI Comments: Patient with a history of moderate to severe coronary disease as evidenced on recent Cardiac Cath on 04/21/15 presents today with a chief complaint of chest pain.  He reports onset of pain around noon today while walking his dog.  Pain substernal and non radiating.  Pain lasted approximately one hour and then resolved.  He denies any chest pain at this time.  He did not take NG or any other medication for the pain.  Pain associated with some lightheadedness and some SOB.  He denies nausea, vomiting, syncope, numbness, or tingling.  He reports that he has been having this pain intermittently for months, usually with exertion, but occasionally at rest.  He is followed by Dr. Wynonia Lawman with Cardiology.  He saw Dr. Wynonia Lawman on 06/14/15, three days ago.  At that time Dr. Wynonia Lawman recommended follow up with Interventional Cardiology and Dr. Burt Knack for possible stent placement.  Patient reports that he tried to make an appointment with Dr. Burt Knack, but he is on vacation this week.  He states that was instructed by staff at  Cardiology to go to the ED when he called.  The history is provided by the patient.    Past Medical History  Diagnosis Date  . Hyperlipidemia   . Esophageal reflux   . Prostate cancer   . Hx of radiation therapy 04/14/13-06/09/13    prostate 7800 cGy, 40 sessions, seminal vesicles 5600 cGy 40 sessions  . Exertional angina 04/21/2015   Past Surgical History  Procedure Laterality Date  . Nose surgery    . Tonsillectomy    . Needle biopsy of prostate    . Colonoscopy    . Prostate surgery    . Cardiac catheterization N/A 04/21/2015    Procedure: Right/Left Heart Cath and Coronary Angiography;  Surgeon: [name], MD;  Location: [place] [facility];  Service: Cardiovascular;  Laterality: N/A;   Family History  Problem Relation Age of Onset  . Brain cancer Father   . Depression Daughter   . Lymphoma Mother    History  Substance Use Topics  . Smoking status: Former Smoker    Quit date: 03/05/1968  . Smokeless tobacco: Never Used  . Alcohol Use: 7.5 oz/week    15 Standard drinks or equivalent per week     Comment: rare    Review of Systems  All other systems reviewed and are negative.     Allergies  Review of patient's allergies indicates no known allergies.  Home Medications   Prior to Admission medications   Medication Sig Start Date End Date Taking? Authorizing Provider  aspirin 81 MG tablet Take 81 mg by mouth daily.    Historical Provider, MD  buPROPion (WELLBUTRIN SR) 100 MG 12 hr tablet TAKE 1 TABLET TWICE DAILY. 06/17/15   [name], MD  doxycycline (VIBRA-TABS) 100 MG tablet Take 1 tablet (100 mg total) by mouth 2 (two) times daily. 06/03/15   [name], MD  FLUoxetine (PROZAC) 10 MG capsule Take 1 capsule (10 mg total) by mouth daily. Patient not taking: Reported on 06/03/2015 03/22/15   [name], MD  isosorbide mononitrate (IMDUR) 30 MG 24 hr tablet Take 30 mg by mouth every morning.  Historical Provider, MD  magnesium oxide (MAG-OX) 400 MG tablet Take 400 mg by mouth daily.    Historical Provider, MD  metoprolol succinate (TOPROL-XL) 50 MG 24 hr tablet Take 50 mg by mouth daily. Take with or immediately following a meal.(Per Dr.Tilley pt is to take 1/2 a pill daily)    Historical Provider, MD  omeprazole (PRILOSEC) 20 MG capsule Take 20 mg by mouth daily.    Historical Provider, MD  simvastatin (ZOCOR) 40 MG tablet Take 40 mg by mouth daily at 6 PM.    Historical Provider, MD  tamsulosin (FLOMAX) 0.4 MG CAPS capsule Take 0.4 mg by mouth daily.    Historical Provider, MD  vitamin B-12 (CYANOCOBALAMIN) 1000 MCG tablet Take 1,000 mcg by mouth daily.     Historical Provider, MD   BP 131/68 mmHg  Pulse 55  Temp(Src) 97.2 F (36.2 C) (Oral)  Resp 12  Ht 6' (1.829 m)  Wt 151 lb (68.493 kg)  BMI 20.47 kg/m2  SpO2 100% Physical Exam  Constitutional: He appears well-developed and well-nourished.  HENT:  Head: Normocephalic and atraumatic.  Neck: Normal range of motion. Neck supple.  Cardiovascular: Normal rate, regular rhythm and normal heart sounds.   Pulses:      Dorsalis pedis pulses are 2+ on the right side, and 2+ on the left side.  Pulmonary/Chest: Effort normal and breath sounds normal. No respiratory distress. He has no wheezes. He has no rales.  Abdominal: Soft. There is no tenderness.  Musculoskeletal: Normal range of motion.  No LE edema or erythema bilaterally  Neurological: He is alert.  Skin: Skin is warm and dry.  Psychiatric: He has a normal mood and affect.  Nursing note and vitals reviewed.   ED Course  Procedures (including critical care time) Labs Review Labs Reviewed  BASIC METABOLIC PANEL - Abnormal; Notable for the following:    Chloride 99 (*)    All other components within normal limits  CBC - Abnormal; Notable for the following:    Hemoglobin 11.3 (*)    HCT 35.2 (*)    RDW 16.2 (*)    All other components within normal limits  I-STAT TROPOININ, ED    Imaging Review Dg Chest 2 View  06/17/2015   CLINICAL DATA:  Central chest discomfort and weakness for the last 2 weeks.  EXAM: CHEST  2 VIEW  COMPARISON:  02/01/2015  FINDINGS: Cardiomediastinal silhouette is normal. Mediastinal contours appear intact. Small calcified hilar lymph nodes are seen.  There is no evidence of focal airspace consolidation, pleural effusion or pneumothorax.  Osseous structures are without acute abnormality. Flowing right-sided thoracic spine osteophytes are noted consistent with diffuse idiopathic skeletal hyperostosis. Soft tissues are grossly normal.  IMPRESSION: No radiographic evidence of acute cardiopulmonary abnormality.    Electronically Signed   By: Fidela Salisbury M.D.   On: 06/17/2015 16:01     EKG Interpretation   Date/Time:  Thursday June 17 2015 17:16:53 EDT Ventricular Rate:  55 PR Interval:  191 QRS Duration: 84 QT Interval:  465 QTC Calculation: 445 R Axis:   83 Text Interpretation:  Sinus rhythm Consider right atrial enlargement  Borderline right axis deviation No significant change was found Confirmed  by Wyvonnia Dusky  MD, STEPHEN [phone]) on 06/17/2015 5:22:22 PM     5:41 PM Dr. Wynonia Lawman with Cardiology is going to come see the patient in the ED. MDM   Final diagnoses:  None   Patient with a history of CAD with moderate to severe  stenosis presents today with a complaint of chest pain.  He states that pain occurred earlier today while walking his dog.  He denies any pain upon arrival in the ED.  No ischemic changes on EKG.  Initial troponin negative.  CXR is also negative.  Discussed patient with Dr. Wynonia Lawman with Cardiology.  He agreed to admit the patient for further management.      Hyman Bible, PA-C 06/19/15 [id number]  Ezequiel Essex, MD 06/19/15 1003

## 2015-06-17 NOTE — ED Notes (Signed)
MD Rancour at the bedside.   

## 2015-06-17 NOTE — ED Notes (Signed)
PA at the bedside.

## 2015-06-17 NOTE — Telephone Encounter (Signed)
Return call from message left early.  Pt anxious and awaiting call from [facility]- Cardiology for stent placement. Pt continues to have chest pain with activity. Pt has taken NTG several times per pt reporting. Pt presently has chest discomfort but has not taken another NTG.  Advised pt that due to non relief of chest discomfort with NTG to call 911 and evaluation in the ER.  Pt verbalized understanding.  Called and spoke to [place] regarding the above  pt's conversation. Cherre Huger, BSN

## 2015-06-18 ENCOUNTER — Encounter (HOSPITAL_COMMUNITY): Payer: Medicare Other

## 2015-06-18 ENCOUNTER — Encounter (HOSPITAL_COMMUNITY)
Admission: EM | Disposition: A | Discharge status: Home / Self Care | Payer: Self-pay | Source: Home / Self Care | Attending: Cardiology

## 2015-06-18 DIAGNOSIS — R42 Dizziness and giddiness: Secondary | ICD-10-CM | POA: Diagnosis present

## 2015-06-18 DIAGNOSIS — I25119 Atherosclerotic heart disease of native coronary artery with unspecified angina pectoris: Secondary | ICD-10-CM

## 2015-06-18 DIAGNOSIS — K219 Gastro-esophageal reflux disease without esophagitis: Secondary | ICD-10-CM | POA: Diagnosis present

## 2015-06-18 DIAGNOSIS — Z7982 Long term (current) use of aspirin: Secondary | ICD-10-CM | POA: Diagnosis not present

## 2015-06-18 DIAGNOSIS — K627 Radiation proctitis: Secondary | ICD-10-CM | POA: Diagnosis present

## 2015-06-18 DIAGNOSIS — I2584 Coronary atherosclerosis due to calcified coronary lesion: Secondary | ICD-10-CM | POA: Diagnosis present

## 2015-06-18 DIAGNOSIS — Z923 Personal history of irradiation: Secondary | ICD-10-CM | POA: Diagnosis not present

## 2015-06-18 DIAGNOSIS — E785 Hyperlipidemia, unspecified: Secondary | ICD-10-CM | POA: Diagnosis present

## 2015-06-18 DIAGNOSIS — F329 Major depressive disorder, single episode, unspecified: Secondary | ICD-10-CM | POA: Diagnosis present

## 2015-06-18 DIAGNOSIS — R159 Full incontinence of feces: Secondary | ICD-10-CM | POA: Diagnosis present

## 2015-06-18 DIAGNOSIS — Z79899 Other long term (current) drug therapy: Secondary | ICD-10-CM | POA: Diagnosis not present

## 2015-06-18 DIAGNOSIS — I359 Nonrheumatic aortic valve disorder, unspecified: Secondary | ICD-10-CM | POA: Diagnosis not present

## 2015-06-18 DIAGNOSIS — I200 Unstable angina: Secondary | ICD-10-CM | POA: Diagnosis not present

## 2015-06-18 DIAGNOSIS — Z87891 Personal history of nicotine dependence: Secondary | ICD-10-CM | POA: Diagnosis not present

## 2015-06-18 DIAGNOSIS — I25110 Atherosclerotic heart disease of native coronary artery with unstable angina pectoris: Secondary | ICD-10-CM | POA: Diagnosis present

## 2015-06-18 DIAGNOSIS — I350 Nonrheumatic aortic (valve) stenosis: Secondary | ICD-10-CM | POA: Diagnosis not present

## 2015-06-18 DIAGNOSIS — R0789 Other chest pain: Secondary | ICD-10-CM | POA: Diagnosis not present

## 2015-06-18 HISTORY — PX: CARDIAC CATHETERIZATION: SHX172

## 2015-06-18 LAB — POCT I-STAT 3, VENOUS BLOOD GAS (G3P V)
BICARBONATE: 26.3 meq/L — AB (ref 20.0–24.0)
Bicarbonate: 26.6 mEq/L — ABNORMAL HIGH (ref 20.0–24.0)
O2 SAT: 99 %
O2 Saturation: 75 %
PCO2 VEN: 46.5 mmHg (ref 45.0–50.0)
PH VEN: 7.328 — AB (ref 7.250–7.300)
PH VEN: 7.359 — AB (ref 7.250–7.300)
PO2 VEN: 44 mmHg (ref 30.0–45.0)
TCO2: 28 mmol/L (ref 0–100)
TCO2: 28 mmol/L (ref 0–100)
pCO2, Ven: 50.6 mmHg — ABNORMAL HIGH (ref 45.0–50.0)
pO2, Ven: 153 mmHg — ABNORMAL HIGH (ref 30.0–45.0)

## 2015-06-18 LAB — LIPID PANEL
CHOLESTEROL: 155 mg/dL (ref 0–200)
HDL: 49 mg/dL (ref >40)
LDL Cholesterol: 86 mg/dL (ref 0–99)
Total CHOL/HDL Ratio: 3.2 RATIO
Triglycerides: 99 mg/dL (ref <150)
VLDL: 20 mg/dL (ref 0–40)

## 2015-06-18 LAB — TROPONIN I

## 2015-06-18 LAB — POCT ACTIVATED CLOTTING TIME: ACTIVATED CLOTTING TIME: 601 s

## 2015-06-18 SURGERY — INTRAVASCULAR PRESSURE WIRE/FFR STUDY

## 2015-06-18 MED ORDER — HEPARIN (PORCINE) IN NACL 2-0.9 UNIT/ML-% IJ SOLN
INTRAMUSCULAR | Status: AC
  Filled 2015-06-18: qty 1500

## 2015-06-18 MED ORDER — TICAGRELOR 90 MG PO TABS
90.0000 mg | ORAL_TABLET | Freq: Two times a day (BID) | ORAL | Status: DC
  Administered 2015-06-19: 90 mg via ORAL
  Filled 2015-06-18: qty 1

## 2015-06-18 MED ORDER — HEPARIN SODIUM (PORCINE) 1000 UNIT/ML IJ SOLN
INTRAMUSCULAR | Status: DC | PRN
  Administered 2015-06-18: 2000 [IU] via INTRAVENOUS

## 2015-06-18 MED ORDER — SODIUM CHLORIDE 0.9 % IV SOLN
INTRAVENOUS | Status: DC | PRN
  Administered 2015-06-18: 999 mL/h via INTRAVENOUS

## 2015-06-18 MED ORDER — ADENOSINE 12 MG/4ML IV SOLN
12.0000 mL | Freq: Once | INTRAVENOUS | Status: AC
  Administered 2015-06-18: 36 mg via INTRAVENOUS
  Filled 2015-06-18: qty 12

## 2015-06-18 MED ORDER — SODIUM CHLORIDE 0.9 % IV SOLN: 250.0000 mL | INTRAVENOUS | Status: DC | PRN

## 2015-06-18 MED ORDER — SODIUM CHLORIDE 0.9 % IV SOLN
250.0000 mg | INTRAVENOUS | Status: DC | PRN
  Administered 2015-06-18: 1.75 mg/kg/h via INTRAVENOUS

## 2015-06-18 MED ORDER — TICAGRELOR 90 MG PO TABS
ORAL_TABLET | ORAL | Status: DC | PRN
  Administered 2015-06-18: 180 mg via ORAL

## 2015-06-18 MED ORDER — CEFAZOLIN SODIUM-DEXTROSE 2-3 GM-% IV SOLR
INTRAVENOUS | Status: DC | PRN
  Administered 2015-06-18: 2 g via INTRAVENOUS

## 2015-06-18 MED ORDER — NITROGLYCERIN 1 MG/10 ML FOR IR/CATH LAB
INTRA_ARTERIAL | Status: DC | PRN
  Administered 2015-06-18 (×2): 100 ug

## 2015-06-18 MED ORDER — ASPIRIN EC 325 MG PO TBEC: 325.0000 mg | DELAYED_RELEASE_TABLET | Freq: Every day | ORAL | Status: DC

## 2015-06-18 MED ORDER — LIDOCAINE HCL (PF) 1 % IJ SOLN
INTRAMUSCULAR | Status: AC
  Filled 2015-06-18: qty 30

## 2015-06-18 MED ORDER — FENTANYL CITRATE (PF) 100 MCG/2ML IJ SOLN
INTRAMUSCULAR | Status: DC | PRN
  Administered 2015-06-18: 50 ug via INTRAVENOUS

## 2015-06-18 MED ORDER — ADENOSINE (DIAGNOSTIC) 140MCG/KG/MIN
INTRAVENOUS | Status: DC | PRN
  Administered 2015-06-18: 140 ug/kg/min via INTRAVENOUS

## 2015-06-18 MED ORDER — NITROGLYCERIN 1 MG/10 ML FOR IR/CATH LAB
INTRA_ARTERIAL | Status: DC | PRN
  Administered 2015-06-18: 16:00:00

## 2015-06-18 MED ORDER — LIDOCAINE HCL (PF) 1 % IJ SOLN
INTRAMUSCULAR | Status: DC | PRN
  Administered 2015-06-18: 15 mL

## 2015-06-18 MED ORDER — BIVALIRUDIN 250 MG IV SOLR
INTRAVENOUS | Status: AC
  Filled 2015-06-18: qty 250

## 2015-06-18 MED ORDER — FENTANYL CITRATE (PF) 100 MCG/2ML IJ SOLN
INTRAMUSCULAR | Status: AC
  Filled 2015-06-18: qty 4

## 2015-06-18 MED ORDER — PNEUMOCOCCAL VAC POLYVALENT 25 MCG/0.5ML IJ INJ
0.5000 mL | INJECTION | INTRAMUSCULAR | Status: DC
  Filled 2015-06-18: qty 0.5

## 2015-06-18 MED ORDER — BIVALIRUDIN BOLUS VIA INFUSION - CUPID
INTRAVENOUS | Status: DC | PRN
  Administered 2015-06-18: 50.85 mg via INTRAVENOUS

## 2015-06-18 MED ORDER — OXYCODONE-ACETAMINOPHEN 5-325 MG PO TABS: 1.0000 | ORAL_TABLET | ORAL | Status: DC | PRN

## 2015-06-18 MED ORDER — IOHEXOL 350 MG/ML SOLN
INTRAVENOUS | Status: DC | PRN
  Administered 2015-06-18: 50 mL via INTRAVENOUS
  Administered 2015-06-18: 100 mL via INTRAVENOUS
  Administered 2015-06-18 (×2): 50 mL via INTRAVENOUS

## 2015-06-18 MED ORDER — MIDAZOLAM HCL 2 MG/2ML IJ SOLN
INTRAMUSCULAR | Status: DC | PRN
  Administered 2015-06-18: 1 mg via INTRAVENOUS

## 2015-06-18 MED ORDER — NITROGLYCERIN 1 MG/10 ML FOR IR/CATH LAB
INTRA_ARTERIAL | Status: AC
  Filled 2015-06-18: qty 10

## 2015-06-18 MED ORDER — MIDAZOLAM HCL 2 MG/2ML IJ SOLN
INTRAMUSCULAR | Status: AC
  Filled 2015-06-18: qty 4

## 2015-06-18 MED ORDER — ACETAMINOPHEN 325 MG PO TABS: 650.0000 mg | ORAL_TABLET | ORAL | Status: DC | PRN

## 2015-06-18 MED ORDER — TICAGRELOR 90 MG PO TABS
ORAL_TABLET | ORAL | Status: AC
  Filled 2015-06-18: qty 2

## 2015-06-18 MED ORDER — SODIUM CHLORIDE 0.9 % IJ SOLN: 3.0000 mL | Freq: Two times a day (BID) | INTRAMUSCULAR | Status: DC

## 2015-06-18 MED ORDER — SODIUM CHLORIDE 0.9 % IJ SOLN: 3.0000 mL | INTRAMUSCULAR | Status: DC | PRN

## 2015-06-18 MED ORDER — CEFAZOLIN SODIUM-DEXTROSE 2-3 GM-% IV SOLR
INTRAVENOUS | Status: AC
  Filled 2015-06-18: qty 50

## 2015-06-18 MED ORDER — SODIUM CHLORIDE 0.9 % WEIGHT BASED INFUSION: 1.0000 mL/kg/h | INTRAVENOUS | Status: AC

## 2015-06-18 SURGICAL SUPPLY — 26 items
BALLN EMERGE MR 2.75X12 (BALLOONS) ×3
BALLN ~~LOC~~ EUPHORA RX 3.5X12 (BALLOONS) ×3
BALLOON EMERGE MR 2.75X12 (BALLOONS) IMPLANT
BALLOON ~~LOC~~ EUPHORA RX 3.5X12 (BALLOONS) IMPLANT
CATH INFINITI JR4 5F (CATHETERS) ×2 IMPLANT
CATH MICROCATH NAVVUS (MICROCATHETER) IMPLANT
CATH SITESEER 5F MULTI A 2 (CATHETERS) ×2 IMPLANT
CATH SWAN GANZ 7F STRAIGHT (CATHETERS) ×2 IMPLANT
CATH VISTA GUIDE 6FR JR4 (CATHETERS) ×2 IMPLANT
CATH VISTA GUIDE 6FR XB3.5 (CATHETERS) ×2 IMPLANT
DEVICE WIRE ANGIOSEAL 6FR (Vascular Products) ×2 IMPLANT
KIT ENCORE 26 ADVANTAGE (KITS) ×2 IMPLANT
KIT ESSENTIALS PG (KITS) ×2 IMPLANT
KIT HEART LEFT (KITS) ×3 IMPLANT
KIT HEART RIGHT NAMIC (KITS) ×2 IMPLANT
MICROCATHETER NAVVUS (MICROCATHETER) ×3
PACK CARDIAC CATHETERIZATION (CUSTOM PROCEDURE TRAY) ×3 IMPLANT
SHEATH PINNACLE 6F 10CM (SHEATH) ×2 IMPLANT
SHEATH PINNACLE 7F 10CM (SHEATH) ×2 IMPLANT
STENT RESOLUTE INTEG 3.0X15 (Permanent Stent) ×2 IMPLANT
TRANSDUCER W/STOPCOCK (MISCELLANEOUS) ×3 IMPLANT
TUBING CIL FLEX 10 FLL-RA (TUBING) ×3 IMPLANT
WIRE ASAHI PROWATER 180CM (WIRE) ×2 IMPLANT
WIRE EMERALD 3MM-J .035X150CM (WIRE) ×2 IMPLANT
WIRE EMERALD ST .035X150CM (WIRE) ×2 IMPLANT
WIRE HI TORQ BMW 190CM (WIRE) ×2 IMPLANT

## 2015-06-18 NOTE — H&P (View-Only) (Signed)
Subjective:  No recurrent chest pain overnight.  Understands what the plan is for today including catheterization, possible Doppler wire or stenting if significant stenosis demonstrated.  Objective:  Vital Signs in the last 24 hours: BP 115/61 mmHg  Pulse 52  Temp(Src) 97.7 F (36.5 C) (Oral)  Resp 18  Ht 6' (1.829 m)  Wt 67.8 kg (149 lb 7.6 oz)  BMI 20.27 kg/m2  SpO2 97%  Physical Exam: Pleasant male in no acute distress  Lungs:  Clear Cardiac:  Regular rhythm, normal S1 and S2, no S3, 2/6 systolic murmur Abdomen:  Soft, nontender, no masses Extremities:  No edema present  Intake/Output from previous day:    Weight Filed Weights   06/17/15 1530 06/17/15 2032  Weight: 68.493 kg (151 lb) 67.8 kg (149 lb 7.6 oz)    Lab Results: Basic Metabolic Panel:  Recent Labs  06/17/15 1538 06/17/15 2124  NA 135  --   K 4.5  --   CL 99*  --   CO2 29  --   GLUCOSE 96  --   BUN 15  --   CREATININE 1.00 0.97   CBC:  Recent Labs  06/17/15 1538 06/17/15 2124  WBC 6.5 5.3  HGB 11.3* 11.2*  HCT 35.2* 34.5*  MCV 82.1 81.2  PLT 189 165   Cardiac Enzymes: Troponin (Point of Care Test)  Recent Labs  06/17/15 1547  TROPIPOC 0.00   Cardiac Panel (last 3 results)  Recent Labs  06/17/15 2124 06/18/15 0234  TROPONINI <0.03 <0.03    Telemetry: Sinus rhythm  Assessment/Plan:  1.  Unstable angina pectoris with progressive symptoms 2.  Aortic stenosis previously moderate 3.  Hyperlipidemia  Recommendations:  For catheterization later today.  Plans and risks again discussed with patient.     Kerry Hough  MD [facility]  06/18/2015, 8:39 AM

## 2015-06-18 NOTE — Research (Signed)
Patient reviewed for the TWILIGHT research study, based on dictated angiographic results does not meet inclusion criteria.

## 2015-06-18 NOTE — Progress Notes (Signed)
Site area: right groin  Site Prior to Removal:  Level 0  Pressure Applied For 20 MINUTES    Minutes Beginning at 19:15  Manual:   Yes.    Patient Status During Pull:  AXOX3  Post Pull Groin Site:  Level 0  Post Pull Instructions Given:  Yes.    Post Pull Pulses Present:  Yes.    Dressing Applied:  Yes.    Comments:  Pt tolerated removal of venous sheath without complications, will continue to monitor patient and right groin site

## 2015-06-18 NOTE — Interval H&P Note (Signed)
Cath Lab Visit (complete for each Cath Lab visit)  Clinical Evaluation Leading to the Procedure:   ACS: Yes.    Non-ACS:    Anginal Classification: CCS III  Anti-ischemic medical therapy: Maximal Therapy (2 or more classes of medications)  Non-Invasive Test Results: No non-invasive testing performed  Prior CABG: No previous CABG      History and Physical Interval Note:  06/18/2015 10:54 AM  Corey Oneill  has presented today for surgery, with the diagnosis of unstable angina  The various methods of treatment have been discussed with the patient and family. After consideration of risks, benefits and other options for treatment, the patient has consented to  Procedure(s): Left Heart Cath and Coronary Angiography (N/A) as a surgical intervention .  The patient's history has been reviewed, patient examined, no change in status, stable for surgery.  I have reviewed the patient's chart and labs.  Questions were answered to the patient's satisfaction.     Sinclair Grooms

## 2015-06-18 NOTE — Progress Notes (Signed)
Patient stable post PCI of RCA.  Appreciate Dr. Thompson Caul help.  Home in am.  Resume rehab next Weds.  Kerry Hough MD [facility]

## 2015-06-18 NOTE — Progress Notes (Signed)
Subjective:  No recurrent chest pain overnight.  Understands what the plan is for today including catheterization, possible Doppler wire or stenting if significant stenosis demonstrated.  Objective:  Vital Signs in the last 24 hours: BP 115/61 mmHg  Pulse 52  Temp(Src) 97.7 F (36.5 C) (Oral)  Resp 18  Ht 6' (1.829 m)  Wt 67.8 kg (149 lb 7.6 oz)  BMI 20.27 kg/m2  SpO2 97%  Physical Exam: Pleasant male in no acute distress  Lungs:  Clear Cardiac:  Regular rhythm, normal S1 and S2, no S3, 2/6 systolic murmur Abdomen:  Soft, nontender, no masses Extremities:  No edema present  Intake/Output from previous day:    Weight Filed Weights   06/17/15 1530 06/17/15 2032  Weight: 68.493 kg (151 lb) 67.8 kg (149 lb 7.6 oz)    Lab Results: Basic Metabolic Panel:  Recent Labs  06/17/15 1538 06/17/15 2124  NA 135  --   K 4.5  --   CL 99*  --   CO2 29  --   GLUCOSE 96  --   BUN 15  --   CREATININE 1.00 0.97   CBC:  Recent Labs  06/17/15 1538 06/17/15 2124  WBC 6.5 5.3  HGB 11.3* 11.2*  HCT 35.2* 34.5*  MCV 82.1 81.2  PLT 189 165   Cardiac Enzymes: Troponin (Point of Care Test)  Recent Labs  06/17/15 1547  TROPIPOC 0.00   Cardiac Panel (last 3 results)  Recent Labs  06/17/15 2124 06/18/15 0234  TROPONINI <0.03 <0.03    Telemetry: Sinus rhythm  Assessment/Plan:  1.  Unstable angina pectoris with progressive symptoms 2.  Aortic stenosis previously moderate 3.  Hyperlipidemia  Recommendations:  For catheterization later today.  Plans and risks again discussed with patient.     Kerry Hough  MD [facility]  06/18/2015, 8:39 AM

## 2015-06-19 ENCOUNTER — Encounter (HOSPITAL_COMMUNITY): Payer: Self-pay | Admitting: Physician Assistant

## 2015-06-19 ENCOUNTER — Inpatient Hospital Stay (HOSPITAL_COMMUNITY): Payer: Medicare Other

## 2015-06-19 DIAGNOSIS — I350 Nonrheumatic aortic (valve) stenosis: Secondary | ICD-10-CM

## 2015-06-19 DIAGNOSIS — I200 Unstable angina: Secondary | ICD-10-CM

## 2015-06-19 LAB — BASIC METABOLIC PANEL
ANION GAP: 8 (ref 5–15)
BUN: 11 mg/dL (ref 6–20)
CALCIUM: 8.5 mg/dL — AB (ref 8.9–10.3)
CHLORIDE: 103 mmol/L (ref 101–111)
CO2: 24 mmol/L (ref 22–32)
CREATININE: 0.88 mg/dL (ref 0.61–1.24)
GFR calc Af Amer: >60 mL/min (ref >60)
GFR calc non Af Amer: >60 mL/min (ref >60)
Glucose, Bld: 98 mg/dL (ref 65–99)
Potassium: 3.8 mmol/L (ref 3.5–5.1)
SODIUM: 135 mmol/L (ref 135–145)

## 2015-06-19 MED ORDER — METOPROLOL SUCCINATE ER 25 MG PO TB24: ORAL_TABLET | ORAL | Status: DC

## 2015-06-19 MED ORDER — TICAGRELOR 90 MG PO TABS: 90.0000 mg | ORAL_TABLET | Freq: Two times a day (BID) | ORAL | Status: DC

## 2015-06-19 MED ORDER — ANGIOPLASTY BOOK
Freq: Once | Status: DC
  Filled 2015-06-19: qty 1

## 2015-06-19 NOTE — Progress Notes (Addendum)
CARDIAC REHAB PHASE I   Pt walked hallways before being seen. Pt c/o of dizziness went back to room and informed nurse. Sonographer was in while educating pt. Discussed importance of taking antiplatelets ( Brilinta/ASA) and resting. Also discussed when to use NTG and call 911. Pt has a better understanding of angina-like symptoms. Informed RN that pt did not have stent card in his room. Pt will resume CRPII on Wednesday per cardiologist.    [phone]  Corey [name],MS,ACSM-RCEP 06/19/2015 9:42 AM

## 2015-06-19 NOTE — Care Management Note (Signed)
Case Management Note  Patient Details  Name: Miran N Buick MRN: [id number] Date of Birth: 05/01/1936  Subjective/Objective:                   chest pain Action/Plan:  Discharge planning Expected Discharge Date:  06/18/15               Expected Discharge Plan:  Home/Self Care  In-House Referral:     Discharge planning Services  CM Consult, Medication Assistance  Post Acute Care Choice:    Choice offered to:     DME Arranged:    DME Agency:     HH Arranged:    HH Agency:     Status of Service:  Completed, signed off  Medicare Important Message Given:    Date Medicare IM Given:    Medicare IM give by:    Date Additional Medicare IM Given:    Additional Medicare Important Message give by:     If discussed at Long Length of Stay Meetings, dates discussed:    Additional Comments: Cm met with pt in room and gave pt Brilinta free 30 day trial card.  Pt verbalized understanding this card will cover the cost of his prescription and give time for the medication to be authorized by insurance for refills.  No other CM needs were communicated. ,  Christine, RN 06/19/2015, 9:42 AM  

## 2015-06-19 NOTE — Progress Notes (Signed)
Patient Name: Corey Oneill Date of Encounter: 06/19/2015  Principal Problem:   Unstable angina Active Problems:   Aortic stenosis   CAD (coronary artery disease), native coronary artery   Length of Stay: 1  SUBJECTIVE  No angina and no dizziness. No bleeding at femoral cath site.  CURRENT MEDS . angioplasty book   Does not apply Once  . aspirin EC  325 mg Oral Daily  . FLUoxetine  10 mg Oral Daily  . pneumococcal 23 valent vaccine  0.5 mL Intramuscular Tomorrow-1000  . sodium chloride  3 mL Intravenous Q12H  . ticagrelor  90 mg Oral BID  . vitamin B-12  6,000 mcg Oral Daily    OBJECTIVE   Intake/Output Summary (Last 24 hours) at 06/19/15 0809 Last data filed at 06/19/15 0740  Gross per 24 hour  Intake 766.63 ml  Output    600 ml  Net 166.63 ml   Filed Weights   06/17/15 1530 06/17/15 2032 06/19/15 0005  Weight: 151 lb (68.493 kg) 149 lb 7.6 oz (67.8 kg) 149 lb 6.4 oz (67.767 kg)    PHYSICAL EXAM Filed Vitals:   06/18/15 2100 06/19/15 0005 06/19/15 0449 06/19/15 0738  BP: 126/76 126/67 112/61 103/46  Pulse:  59 60 64  Temp:  97.5 F (36.4 C) 97.6 F (36.4 C) 97.9 F (36.6 C)  TempSrc:  Oral Oral Oral  Resp: 10 20 19    Height:      Weight:  149 lb 6.4 oz (67.767 kg)    SpO2:  99% 100% 99%   General: Alert, oriented x3, no distress Head: no evidence of trauma, PERRL, EOMI, no exophtalmos or lid lag, no myxedema, no xanthelasma; normal ears, nose and oropharynx Neck: normal jugular venous pulsations and no hepatojugular reflux; brisk carotid pulses without delay and no carotid bruits Chest: clear to auscultation, no signs of consolidation by percussion or palpation, normal fremitus, symmetrical and full respiratory excursions Cardiovascular: normal position and quality of the apical impulse, regular rhythm, normal first and second heart sounds, no rubs or gallops, 2/6 aortic ejection murmur Abdomen: no tenderness or distention, no masses by palpation, no  abnormal pulsatility or arterial bruits, normal bowel sounds, no hepatosplenomegaly Extremities: no clubbing, cyanosis or edema; 2+ radial, ulnar and brachial pulses bilaterally; 2+ right femoral, posterior tibial and dorsalis pedis pulses; 2+ left femoral, posterior tibial and dorsalis pedis pulses; no subclavian or femoral bruits Neurological: grossly nonfocal  LABS  CBC  Recent Labs  06/17/15 1538 06/17/15 2124  WBC 6.5 5.3  HGB 11.3* 11.2*  HCT 35.2* 34.5*  MCV 82.1 81.2  PLT 189 009   Basic Metabolic Panel  Recent Labs  06/17/15 1538 06/17/15 2124 06/19/15 0300  NA 135  --  135  K 4.5  --  3.8  CL 99*  --  103  CO2 29  --  24  GLUCOSE 96  --  98  BUN 15  --  11  CREATININE 1.00 0.97 0.88  CALCIUM 9.1  --  8.5*   Liver Function Tests No results for input(s): AST, ALT, ALKPHOS, BILITOT, PROT, ALBUMIN in the last 72 hours. No results for input(s): LIPASE, AMYLASE in the last 72 hours. Cardiac Enzymes  Recent Labs  06/17/15 2124 06/18/15 0234 06/18/15 0803  TROPONINI <0.03 <0.03 <0.03   BNP Invalid input(s): POCBNP D-Dimer No results for input(s): DDIMER in the last 72 hours. Hemoglobin A1C No results for input(s): HGBA1C in the last 72 hours. Fasting Lipid Panel  Recent  Labs  06/18/15 0234  CHOL 155  HDL 49  LDLCALC 86  TRIG 99  CHOLHDL 3.2   Thyroid Function Tests No results for input(s): TSH, T4TOTAL, T3FREE, THYROIDAB in the last 72 hours.  Invalid input(s): [place]  Radiology Studies Dg Chest 2 View  06/17/2015   CLINICAL DATA:  Central chest discomfort and weakness for the last 2 weeks.  EXAM: CHEST  2 VIEW  COMPARISON:  02/01/2015  FINDINGS: Cardiomediastinal silhouette is normal. Mediastinal contours appear intact. Small calcified hilar lymph nodes are seen.  There is no evidence of focal airspace consolidation, pleural effusion or pneumothorax.  Osseous structures are without acute abnormality. Flowing right-sided thoracic spine  osteophytes are noted consistent with diffuse idiopathic skeletal hyperostosis. Soft tissues are grossly normal.  IMPRESSION: No radiographic evidence of acute cardiopulmonary abnormality.   Electronically Signed   By: Fidela Salisbury M.D.   On: 06/17/2015 16:01    TELE NSR  ECG NSR, 1st deg AVB  ASSESSMENT AND PLAN  DC home after uncomplicated PCI with DES to distal RCA. Discussed mandatory DAPT for next 12 months. 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 Resume metoprolol succ 25 mg daily and take in the evening to help mitigate dizziness. Resume statin. Stop isosorbide. Monitor for progression of AS, currently moderate to severe. F/U Dr. Wynonia Lawman.  Sanda Klein, MD, [facility] [phone] office [phone] pager 06/19/2015 8:09 AM

## 2015-06-19 NOTE — Progress Notes (Signed)
  Echocardiogram 2D Echocardiogram has been performed.  Corey Oneill M 06/19/2015, 10:05 AM

## 2015-06-19 NOTE — Discharge Summary (Signed)
Discharge Summary   Patient ID: Corey Oneill, MRN: [id number], DOB/AGE: May 25, 1936 79 y.o.   Admit date: 06/17/2015 Discharge date: 06/19/2015   Primary Care Physician:  [name]   Primary Cardiologist:  Dr. Ezzard Standing  Primary Electrophysiologist:  N/a TAVR Eval:  Dr. [name]    Reason for Admission:  Unstable Angina   Primary Discharge Diagnoses:  Principal Problem:   Unstable angina Active Problems:   CAD (coronary artery disease), native coronary artery   Aortic stenosis   Hyperlipidemia     Wt Readings from Last 3 Encounters:  06/19/15 149 lb 6.4 oz (67.767 kg)  06/03/15 154 lb (69.854 kg)  05/20/15 152 lb 12.5 oz (69.3 kg)    Secondary Discharge Diagnoses:   Past Medical History  Diagnosis Date  . Hyperlipidemia   . Esophageal reflux   . Prostate cancer   . Hx of radiation therapy 04/14/13-06/09/13    prostate 7800 cGy, 40 sessions, seminal vesicles 5600 cGy 40 sessions  . CAD (coronary artery disease)     a.  LHC 8/16: Mid to distal LAD 30%, OM1 40%, proximal mid RCA 40%, distal RCA 60% >> FFR 0.69  >> PCI: 3 x 15 mm Resolute DES  . Aortic stenosis     a. peak to peak gradient by LHC 8/16:  37 mmHg (moderately severe)       Allergies:   No Known Allergies    Procedures Performed This Admission:    CARDIAC CATH AND PCI 06/18/15 1. 1st Mrg lesion, 40% stenosed. FFR was 0.83 2. Prox RCA to Mid RCA lesion, 40% stenosed. 3. Mid LAD to Dist LAD lesion, 30% stenosed. 4. Dist RCA lesion, 60% stenosed but with FFR .69. There is a 0% residual stenosis post intervention. 5. A drug-eluting stent was placed. 6. Moderately severe aortic stenosis with a valve area calculating 0.81 - 1.2 cm depending upon the cardiac output used.   Moderately severe aortic stenosis, unchanged since previous catheterization 2-1/2 months ago.  Crescendo angina felt secondary to progression of right coronary disease.  Angiographic 60% distal stenosis in  the right coronary documented to be hemodynamically significant by FFR 0.69.  Successful drug-eluting stent placement in the distal right coronary reducing the 60% stenosis to 0% with TIMI grade 3 flow. Vessel occlusion during the procedure reproduced the patient's exertional angina.  STENT RESOLUTE INTEG 3.0X15 - [id number]  FFR evaluation of obtuse marginal #1 revealed no evidence of hemodynamic significance with a value 0.83.  Normal left ventricular function  Recommendations:   Check renal function and overall clinical status in a.m. and if stable the patient will be eligible for discharge.  Dual antiplatelets therapy for at least 6 months. We have chosen aspirin and Brilinta.     Coronary Findings    Dominance: Right   Left Anterior Descending   . Mid LAD to Dist LAD lesion, 30% stenosed. eccentric .   [name] First Diagonal Branch   The vessel is small in size.   [name] Second Diagonal Branch   The vessel is moderate in size.   . Third Diagonal Branch   The vessel is small in size.     Left Circumflex   . First Obtuse Marginal Branch   . 1st Mrg lesion, 40% stenosed. The lesion is type non-C .     Right Coronary Artery   . Prox RCA to Mid RCA lesion, 40% stenosed. The lesion is type C calcified eccentric .   [name] Dist RCA  lesion, 60% stenosed. The lesion is type C calcified eccentric .   [name] PCI: The pre-interventional distal flow is normal (TIMI 3). Pre-stent angioplasty was performed. A drug-eluting stent was placed. Post-stent angioplasty was performed. The post-interventional distal flow is normal (TIMI 3). The intervention was successful. No complications occurred at this lesion. Pressure wire/FFR was performed on the lesion. FFR post intervention: 0.69.  [name] There is no residual stenosis post intervention.        Wall Motion                 Left Heart    Left Ventricle The left ventricular ejection fraction is 55-65% by visual estimate.   Aortic Valve There is  moderate aortic valve stenosis, and no aortic valve regurgitation. The aortic valve is calcified.    Coronary Diagrams    Diagnostic Diagram           Post-Intervention Diagram              Hospital Course:  Corey Oneill is a 79 y.o. male with a hx of CAD, aortic stenosis, HL, prostate CA.  He started having exertional angina 6 mos ago and echo demonstrated moderate severe AS with mean gradient of 33 mmHg.  He saw Dr. [name] for evaluation of AS.  Dr. Burt Knack did catheterization but only found a mean gradient of around 23 mm and recommended medical therapy. There is also stenosis in a marginal branch that had the septal perforator coming off of it estimated at no more than 70%. He was tried initially on medical therapy with an increase in his metoprolol to 50 mg. Nitrates were eventually added but he could not tolerate this.  He continued to have anginal symptoms.  Films were reviewed with Dr. Daneen Schick and no targets for intervention were found.  He then presented to the hospital 06/17/15 with progressively worsening angina.  Relook cardiac catheterization was performed by Dr. Tamala Julian 06/18/15. Peak to peak gradient on pullback across the aortic valve was 37 mmHg and mean pressure was 36 mmHg. There was 40% stenosis and OM1 with FFR 0.83, proximal to mid RCA 40%, mid to distal LAD 30%, distal RCA 60% with FFR 0.69. Therefore PCI was performed of the distal RCA with a Resolute DES. The patient's moderately severe aortic stenosis was felt to be unchanged since previous cardiac catheterization. LV function was noted to be normal.  He was evaluated by Dr. Dani Gobble Croitoru this AM and noted to be in stable condition without recurrent chest pain.  He is felt stable for DC to home.   He was continued on Brilinta, ASA, Toprol-XL 25 QPM.  Imdur was DC'd.  He will FU with Dr. Wynonia Lawman.   Discharge Vitals:   Blood pressure 103/46, pulse 64, temperature 97.9 F (36.6 C), temperature source Oral,  resp. rate 19, height 6' (1.829 m), weight 149 lb 6.4 oz (67.767 kg), SpO2 99 %.   Labs:   Recent Labs  06/17/15 1538 06/17/15 2124  WBC 6.5 5.3  HGB 11.3* 11.2*  HCT 35.2* 34.5*  MCV 82.1 81.2  PLT 189 165     Recent Labs  06/17/15 1538 06/17/15 2124 06/19/15 0300  NA 135  --  135  K 4.5  --  3.8  CL 99*  --  103  CO2 29  --  24  BUN 15  --  11  CREATININE 1.00 0.97 0.88  CALCIUM 9.1  --  8.5*     Recent  Labs  06/17/15 2124 06/18/15 0234 06/18/15 0803  TROPONINI <0.03 <0.03 <0.03    Lab Results  Component Value Date   CHOL 155 06/18/2015   HDL 49 06/18/2015   LDLCALC 86 06/18/2015   TRIG 99 06/18/2015     Recent Labs  06/17/15 2305  INR 1.24     Diagnostic Procedures and Studies:  Dg Chest 2 View  06/17/2015   IMPRESSION: No radiographic evidence of acute cardiopulmonary abnormality.   Electronically Signed   By: Fidela Salisbury M.D.   On: 06/17/2015 16:01    Disposition:   Pt is being discharged home today in good condition.  Follow-up Plans & Appointments      Follow-up Information    Follow up with Ezzard Standing, MD. Schedule an appointment as soon as possible for a visit in 1 week.   Specialty:  Cardiology   Contact information:   [address] [place] [address] [phone]       Discharge Medications    Medication List    STOP taking these medications        doxycycline 100 MG tablet  Commonly known as:  VIBRA-TABS      TAKE these medications        aspirin 81 MG tablet  Take 81 mg by mouth daily.     buPROPion 100 MG 12 hr tablet  Commonly known as:  WELLBUTRIN SR  TAKE 1 TABLET TWICE DAILY.     FLUoxetine 10 MG capsule  Commonly known as:  PROZAC  Take 1 capsule (10 mg total) by mouth daily.     Magnesium 250 MG Tabs  Take 1 tablet by mouth daily.     metoprolol succinate 25 MG 24 hr tablet  Commonly known as:  TOPROL-XL  Take 1/2 tablet at bedtime.     omeprazole 20 MG  capsule  Commonly known as:  PRILOSEC  Take 20 mg by mouth daily.     simvastatin 40 MG tablet  Commonly known as:  ZOCOR  Take 40 mg by mouth every other day.     ticagrelor 90 MG Tabs tablet  Commonly known as:  BRILINTA  Take 1 tablet (90 mg total) by mouth 2 (two) times daily.     vitamin B-12 1000 MCG tablet  Commonly known as:  CYANOCOBALAMIN  Take 1,000 mcg by mouth See admin instructions. Take 6,025mcg daily per patient         Outstanding Labs/Studies  1. None    Duration of Discharge Encounter: Greater than 30 minutes including physician and PA time.  Signed, Richardson Dopp, PA-C   06/19/2015 8:49 AM

## 2015-06-19 NOTE — Discharge Instructions (Signed)
DO NOT MISS A DOSE OF ASPIRIN OR BRILINTA

## 2015-06-21 ENCOUNTER — Encounter (HOSPITAL_COMMUNITY): Payer: Medicare Other

## 2015-06-21 ENCOUNTER — Encounter (HOSPITAL_COMMUNITY): Payer: Self-pay | Admitting: Interventional Cardiology

## 2015-06-22 ENCOUNTER — Encounter (HOSPITAL_COMMUNITY): Payer: Self-pay | Admitting: Physical Medicine and Rehabilitation

## 2015-06-22 ENCOUNTER — Emergency Department (HOSPITAL_COMMUNITY): Payer: Medicare Other

## 2015-06-22 ENCOUNTER — Inpatient Hospital Stay (HOSPITAL_COMMUNITY)
Admission: EM | Admit: 2015-06-22 | Discharge: 2015-06-24 | DRG: 282 | Disposition: A | Discharge status: Home / Self Care | Payer: Medicare Other | Attending: Cardiology | Admitting: Cardiology

## 2015-06-22 DIAGNOSIS — I214 Non-ST elevation (NSTEMI) myocardial infarction: Principal | ICD-10-CM | POA: Diagnosis present

## 2015-06-22 DIAGNOSIS — I350 Nonrheumatic aortic (valve) stenosis: Secondary | ICD-10-CM | POA: Diagnosis present

## 2015-06-22 DIAGNOSIS — K627 Radiation proctitis: Secondary | ICD-10-CM | POA: Diagnosis present

## 2015-06-22 DIAGNOSIS — Z87891 Personal history of nicotine dependence: Secondary | ICD-10-CM

## 2015-06-22 DIAGNOSIS — Z955 Presence of coronary angioplasty implant and graft: Secondary | ICD-10-CM | POA: Diagnosis not present

## 2015-06-22 DIAGNOSIS — D649 Anemia, unspecified: Secondary | ICD-10-CM | POA: Diagnosis not present

## 2015-06-22 DIAGNOSIS — E785 Hyperlipidemia, unspecified: Secondary | ICD-10-CM | POA: Diagnosis present

## 2015-06-22 DIAGNOSIS — I25110 Atherosclerotic heart disease of native coronary artery with unstable angina pectoris: Secondary | ICD-10-CM | POA: Diagnosis not present

## 2015-06-22 DIAGNOSIS — I200 Unstable angina: Secondary | ICD-10-CM

## 2015-06-22 DIAGNOSIS — Z8546 Personal history of malignant neoplasm of prostate: Secondary | ICD-10-CM

## 2015-06-22 DIAGNOSIS — Z923 Personal history of irradiation: Secondary | ICD-10-CM

## 2015-06-22 DIAGNOSIS — R079 Chest pain, unspecified: Secondary | ICD-10-CM | POA: Diagnosis not present

## 2015-06-22 DIAGNOSIS — Z79899 Other long term (current) drug therapy: Secondary | ICD-10-CM

## 2015-06-22 DIAGNOSIS — I2510 Atherosclerotic heart disease of native coronary artery without angina pectoris: Secondary | ICD-10-CM | POA: Diagnosis present

## 2015-06-22 DIAGNOSIS — R778 Other specified abnormalities of plasma proteins: Secondary | ICD-10-CM | POA: Insufficient documentation

## 2015-06-22 DIAGNOSIS — I25118 Atherosclerotic heart disease of native coronary artery with other forms of angina pectoris: Secondary | ICD-10-CM | POA: Diagnosis not present

## 2015-06-22 DIAGNOSIS — Z7982 Long term (current) use of aspirin: Secondary | ICD-10-CM

## 2015-06-22 DIAGNOSIS — I25119 Atherosclerotic heart disease of native coronary artery with unspecified angina pectoris: Secondary | ICD-10-CM

## 2015-06-22 DIAGNOSIS — R7989 Other specified abnormal findings of blood chemistry: Secondary | ICD-10-CM | POA: Diagnosis not present

## 2015-06-22 DIAGNOSIS — K219 Gastro-esophageal reflux disease without esophagitis: Secondary | ICD-10-CM | POA: Diagnosis present

## 2015-06-22 LAB — CBC WITH DIFFERENTIAL/PLATELET
BASOS ABS: 0 10*3/uL (ref 0.0–0.1)
BASOS PCT: 1 % (ref 0–1)
EOS PCT: 2 % (ref 0–5)
Eosinophils Absolute: 0.2 10*3/uL (ref 0.0–0.7)
HCT: 33.4 % — ABNORMAL LOW (ref 39.0–52.0)
HEMOGLOBIN: 10.7 g/dL — AB (ref 13.0–17.0)
LYMPHS ABS: 0.9 10*3/uL (ref 0.7–4.0)
Lymphocytes Relative: 11 % — ABNORMAL LOW (ref 12–46)
MCH: 26.6 pg (ref 26.0–34.0)
MCHC: 32 g/dL (ref 30.0–36.0)
MCV: 83.1 fL (ref 78.0–100.0)
Monocytes Absolute: 0.9 10*3/uL (ref 0.1–1.0)
Monocytes Relative: 11 % (ref 3–12)
NEUTROS ABS: 6.2 10*3/uL (ref 1.7–7.7)
Neutrophils Relative %: 75 % (ref 43–77)
Platelets: 186 10*3/uL (ref 150–400)
RBC: 4.02 MIL/uL — ABNORMAL LOW (ref 4.22–5.81)
RDW: 16.2 % — AB (ref 11.5–15.5)
WBC: 8.2 10*3/uL (ref 4.0–10.5)

## 2015-06-22 LAB — I-STAT TROPONIN, ED: Troponin i, poc: 0.18 ng/mL (ref 0.00–0.08)

## 2015-06-22 LAB — COMPREHENSIVE METABOLIC PANEL
ALT: 12 U/L — ABNORMAL LOW (ref 17–63)
AST: 22 U/L (ref 15–41)
Albumin: 3.6 g/dL (ref 3.5–5.0)
Alkaline Phosphatase: 64 U/L (ref 38–126)
Anion gap: 9 (ref 5–15)
BUN: 18 mg/dL (ref 6–20)
CHLORIDE: 100 mmol/L — AB (ref 101–111)
CO2: 24 mmol/L (ref 22–32)
Calcium: 8.8 mg/dL — ABNORMAL LOW (ref 8.9–10.3)
Creatinine, Ser: 1.06 mg/dL (ref 0.61–1.24)
GFR calc Af Amer: >60 mL/min (ref >60)
GFR calc non Af Amer: >60 mL/min (ref >60)
Glucose, Bld: 146 mg/dL — ABNORMAL HIGH (ref 65–99)
POTASSIUM: 4.2 mmol/L (ref 3.5–5.1)
Sodium: 133 mmol/L — ABNORMAL LOW (ref 135–145)
TOTAL PROTEIN: 6.6 g/dL (ref 6.5–8.1)
Total Bilirubin: 0.7 mg/dL (ref 0.3–1.2)

## 2015-06-22 LAB — TROPONIN I: TROPONIN I: 0.24 ng/mL — AB (ref <0.031)

## 2015-06-22 MED ORDER — HEPARIN BOLUS VIA INFUSION
4000.0000 [IU] | Freq: Once | INTRAVENOUS | Status: AC
  Administered 2015-06-22: 4000 [IU] via INTRAVENOUS
  Filled 2015-06-22: qty 4000

## 2015-06-22 MED ORDER — METOPROLOL SUCCINATE ER 25 MG PO TB24
25.0000 mg | ORAL_TABLET | Freq: Every day | ORAL | Status: DC
  Administered 2015-06-23 – 2015-06-24 (×2): 25 mg via ORAL
  Filled 2015-06-22 (×2): qty 1

## 2015-06-22 MED ORDER — FLUOXETINE HCL 10 MG PO CAPS
10.0000 mg | ORAL_CAPSULE | Freq: Every day | ORAL | Status: DC
  Administered 2015-06-23 – 2015-06-24 (×2): 10 mg via ORAL
  Filled 2015-06-22 (×2): qty 1

## 2015-06-22 MED ORDER — NITROGLYCERIN IN D5W 200-5 MCG/ML-% IV SOLN
2.0000 ug/min | INTRAVENOUS | Status: DC
  Administered 2015-06-22: 5 ug/min via INTRAVENOUS
  Filled 2015-06-22: qty 250

## 2015-06-22 MED ORDER — ONDANSETRON HCL 4 MG/2ML IJ SOLN: 4.0000 mg | Freq: Four times a day (QID) | INTRAMUSCULAR | Status: DC | PRN

## 2015-06-22 MED ORDER — PANTOPRAZOLE SODIUM 40 MG PO TBEC
40.0000 mg | DELAYED_RELEASE_TABLET | Freq: Every day | ORAL | Status: DC
  Administered 2015-06-23 – 2015-06-24 (×2): 40 mg via ORAL
  Filled 2015-06-22 (×2): qty 1

## 2015-06-22 MED ORDER — ASPIRIN EC 81 MG PO TBEC
81.0000 mg | DELAYED_RELEASE_TABLET | Freq: Every day | ORAL | Status: DC
  Administered 2015-06-23 – 2015-06-24 (×2): 81 mg via ORAL
  Filled 2015-06-22 (×2): qty 1

## 2015-06-22 MED ORDER — SIMVASTATIN 40 MG PO TABS
40.0000 mg | ORAL_TABLET | ORAL | Status: DC
Start: 2015-06-22 — End: 2015-06-24
  Administered 2015-06-22: 40 mg via ORAL
  Filled 2015-06-22: qty 1

## 2015-06-22 MED ORDER — ASPIRIN 81 MG PO CHEW
324.0000 mg | CHEWABLE_TABLET | ORAL | Status: AC
  Administered 2015-06-22: 243 mg via ORAL
  Filled 2015-06-22: qty 4

## 2015-06-22 MED ORDER — ASPIRIN 81 MG PO TABS: 81.0000 mg | ORAL_TABLET | Freq: Every day | ORAL | Status: DC

## 2015-06-22 MED ORDER — NITROGLYCERIN 0.4 MG SL SUBL: 0.4000 mg | SUBLINGUAL_TABLET | SUBLINGUAL | Status: DC | PRN

## 2015-06-22 MED ORDER — ACETAMINOPHEN 325 MG PO TABS: 650.0000 mg | ORAL_TABLET | ORAL | Status: DC | PRN

## 2015-06-22 MED ORDER — ASPIRIN 300 MG RE SUPP: 300.0000 mg | RECTAL | Status: AC

## 2015-06-22 MED ORDER — HEPARIN (PORCINE) IN NACL 100-0.45 UNIT/ML-% IJ SOLN
1000.0000 [IU]/h | INTRAMUSCULAR | Status: DC
  Administered 2015-06-22: 800 [IU]/h via INTRAVENOUS
  Filled 2015-06-22: qty 250

## 2015-06-22 MED ORDER — TICAGRELOR 90 MG PO TABS
90.0000 mg | ORAL_TABLET | Freq: Two times a day (BID) | ORAL | Status: DC
  Administered 2015-06-22 – 2015-06-24 (×4): 90 mg via ORAL
  Filled 2015-06-22 (×4): qty 1

## 2015-06-22 MED ORDER — BUPROPION HCL ER (SR) 100 MG PO TB12
100.0000 mg | ORAL_TABLET | Freq: Two times a day (BID) | ORAL | Status: DC
Start: 2015-06-22 — End: 2015-06-24
  Administered 2015-06-22 – 2015-06-24 (×4): 100 mg via ORAL
  Filled 2015-06-22 (×4): qty 1

## 2015-06-22 NOTE — ED Provider Notes (Signed)
CSN: [id number]     Arrival date & time 06/22/15  1536 History   First MD Initiated Contact with Patient 06/22/15 1614     Chief Complaint  Patient presents with  . Chest Pain     (Consider location/radiation/quality/duration/timing/severity/associated sxs/prior Treatment) The history is provided by the patient and medical records. No language interpreter was used.   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 Patient was notably admitted to the hospital late last week and had stents placed 4 days ago. Patient had underwent cardiac catheterization for worsening exertional angina. The patient had a distal RCA stent that was placed by cardiology here. Patient relates that after this he had mild twinges of chest pain or pressure but this quickly resolved. Patient states that earlier today he had what he would describe as chest pressure in his lower mid chest. He denies any nausea or vomiting or diaphoresis with this. He relates that he was sitting at his desk when this chest pain started. He states his old chest pain used to be more exertional and today he has chest pain just with being still. He denies this pain radiating anywhere. He denies any recent fevers chills or shortness of breath. Patient also relates that since coming home from the hospital he's noted that he's had 2 or 3 episodes of rectal bleeding is well. Patient associates this with his radiation proctitis. He denies any pain with this. Patient is notably on antiplatelet therapy since the stent which could be contributing to this.  Past Medical History  Diagnosis Date  . Hyperlipidemia   . Esophageal reflux   . Prostate cancer   . Hx of radiation therapy 04/14/13-06/09/13    prostate 7800 cGy, 40 sessions, seminal vesicles 5600 cGy 40 sessions  . CAD (coronary artery disease)      a.  LHC 8/16: Mid to distal LAD 30%, OM1 40%, proximal mid RCA 40%, distal RCA 60% >> FFR 0.69  >> PCI: 3 x 15 mm Resolute DES  . Aortic stenosis     a. peak to peak gradient by LHC 8/16:  37 mmHg (moderately severe)    Past Surgical History  Procedure Laterality Date  . Nose surgery    . Tonsillectomy    . Needle biopsy of prostate    . Colonoscopy    . Prostate surgery    . Cardiac catheterization N/A 04/21/2015    Procedure: Right/Left Heart Cath and Coronary Angiography;  Surgeon: [name], MD;  Location: [place] [facility];  Service: Cardiovascular;  Laterality: N/A;  . Cardiac catheterization N/A 06/18/2015    Procedure: Intravascular Pressure Wire/FFR Study;  Surgeon: [name], MD;  Location: [place] [facility];  Service: Cardiovascular;  Laterality: N/A;  . Cardiac catheterization N/A 06/18/2015    Procedure: Coronary Stent Intervention;  Surgeon: [name], MD;  Location: [place] [facility];  Service: Cardiovascular;  Laterality: N/A;  . Cardiac catheterization N/A 06/18/2015    Procedure: Right/Left Heart Cath and Coronary Angiography;  Surgeon: [name], MD;  Location: [place] [facility];  Service: Cardiovascular;  Laterality: N/A;   Family History  Problem Relation Age of Onset  . Brain cancer Father   . Depression Daughter   . Lymphoma Mother    Social History  Substance Use Topics  . Smoking status: Former Audiological scientist  date: 03/05/1968  . Smokeless tobacco: Never Used  . Alcohol Use: 7.5 oz/week    15 Standard drinks or equivalent per week     Comment: rare    Review of Systems  Constitutional: Negative for fever and chills.  HENT: Negative for congestion and rhinorrhea.   Respiratory: Positive for chest tightness. Negative for shortness of breath.   Cardiovascular: Positive for chest pain (as described in HPI). Negative for leg swelling.  Gastrointestinal: Positive for anal bleeding (associates this with radiation proctitis). Negative for  nausea, vomiting and diarrhea.  Genitourinary: Negative for dysuria, hematuria and difficulty urinating.  Skin: Negative for pallor and rash.  Neurological: Negative for light-headedness and headaches.  Psychiatric/Behavioral: Negative for confusion and agitation.  All other systems reviewed and are negative.     Allergies  Review of patient's allergies indicates no known allergies.  Home Medications   Prior to Admission medications   Medication Sig Start Date End Date Taking? Authorizing Provider  acetaminophen (TYLENOL) 500 MG tablet Take 500 mg by mouth daily as needed (pain).   Yes Historical Provider, MD  aspirin EC 81 MG tablet Take 81 mg by mouth daily.   Yes Historical Provider, MD  buPROPion (WELLBUTRIN SR) 100 MG 12 hr tablet TAKE 1 TABLET TWICE DAILY. 06/17/15  Yes [name], MD  Cyanocobalamin (VITAMIN B-12 PO) Take 1 tablet by mouth daily.   Yes Historical Provider, MD  FLUoxetine (PROZAC) 10 MG capsule Take 1 capsule (10 mg total) by mouth daily. 03/22/15  Yes [name], MD  Magnesium 250 MG TABS Take 250 mg by mouth daily.    Yes Historical Provider, MD  metoprolol succinate (TOPROL-XL) 50 MG 24 hr tablet Take 25 mg by mouth daily. Take with or immediately following a meal.   Yes Historical Provider, MD  nitroGLYCERIN (NITROSTAT) 0.4 MG SL tablet Place 0.4 mg under the tongue every 5 (five) minutes as needed for chest pain.   Yes Historical Provider, MD  omeprazole (PRILOSEC) 20 MG capsule Take 20 mg by mouth daily.   Yes Historical Provider, MD  simvastatin (ZOCOR) 40 MG tablet Take 40 mg by mouth every other day. Take at bedtime   Yes Historical Provider, MD  ticagrelor (BRILINTA) 90 MG TABS tablet Take 1 tablet (90 mg total) by mouth 2 (two) times daily. 06/19/15  Yes Liliane Shi, PA-C  metoprolol succinate (TOPROL-XL) 25 MG 24 hr tablet Take 1/2 tablet at bedtime. Patient not taking: Reported on 06/22/2015 06/19/15   Liliane Shi, PA-C   BP 133/72 mmHg  Pulse  67  Temp(Src) 97.5 F (36.4 C) (Oral)  Resp 16  Ht 6' (1.829 m)  Wt 148 lb 9.6 oz (67.405 kg)  BMI 20.15 kg/m2  SpO2 100% Physical Exam  Constitutional: He is oriented to person, place, and time. No distress.  Frail appearing  HENT:  Head: Normocephalic and atraumatic.  Eyes: Conjunctivae and EOM are normal.  Neck: Normal range of motion. Neck supple.  Cardiovascular: Normal rate and regular rhythm.   Pulmonary/Chest: Effort normal and breath sounds normal.  Abdominal: Soft. Bowel sounds are normal. He exhibits no distension. There is no tenderness.  Musculoskeletal: Normal range of motion. He exhibits no tenderness.  Neurological: He is oriented to person, place, and time.  Skin: Skin is dry. He is not diaphoretic.  Psychiatric: He has a normal mood and affect. His behavior is normal.    ED Course  Procedures (including critical care time) Labs Review Labs Reviewed  CBC WITH DIFFERENTIAL/PLATELET - Abnormal; Notable for the following:    RBC 4.02 (*)    Hemoglobin 10.7 (*)    HCT 33.4 (*)    RDW 16.2 (*)    Lymphocytes Relative 11 (*)    All other components within normal limits  COMPREHENSIVE METABOLIC PANEL - Abnormal; Notable for the following:    Sodium 133 (*)    Chloride 100 (*)    Glucose, Bld 146 (*)    Calcium 8.8 (*)    ALT 12 (*)    All other components within normal limits  HEPARIN LEVEL (UNFRACTIONATED) - Abnormal; Notable for the following:    Heparin Unfractionated 0.19 (*)    All other components within normal limits  CBC - Abnormal; Notable for the following:    RBC 3.73 (*)    Hemoglobin 9.8 (*)    HCT 30.6 (*)    RDW 16.3 (*)    All other components within normal limits  TROPONIN I - Abnormal; Notable for the following:    Troponin I 0.24 (*)    All other components within normal limits  TROPONIN I - Abnormal; Notable for the following:    Troponin I 0.21 (*)    All other components within normal limits  TROPONIN I - Abnormal; Notable for  the following:    Troponin I 0.16 (*)    All other components within normal limits  BASIC METABOLIC PANEL - Abnormal; Notable for the following:    Glucose, Bld 103 (*)    Calcium 8.7 (*)    All other components within normal limits  HEPARIN LEVEL (UNFRACTIONATED) - Abnormal; Notable for the following:    Heparin Unfractionated 0.24 (*)    All other components within normal limits  CBC WITH DIFFERENTIAL/PLATELET - Abnormal; Notable for the following:    RBC 3.75 (*)    Hemoglobin 10.0 (*)    HCT 31.1 (*)    RDW 16.5 (*)    All other components within normal limits  URINALYSIS, ROUTINE W REFLEX MICROSCOPIC (NOT AT [facility]) - Abnormal; Notable for the following:    Hgb urine dipstick LARGE (*)    All other components within normal limits  I-STAT TROPOININ, ED - Abnormal; Notable for the following:    Troponin i, poc 0.18 (*)    All other components within normal limits  URINE MICROSCOPIC-ADD ON  CBC    Imaging Review Dg Chest 2 View  06/22/2015   CLINICAL DATA:  Chest pain, stent placed last Friday  EXAM: CHEST  2 VIEW  COMPARISON:  06/17/2015  FINDINGS: Lungs are essentially clear. No focal consolidation. No pleural effusion or pneumothorax.  Calcified left hilar nodes.  Degenerative changes of the visualized thoracolumbar spine.  IMPRESSION: No evidence of acute cardiopulmonary disease.   Electronically Signed   By: Julian Hy M.D.   On: 06/22/2015 16:56     EKG Interpretation   Date/Time:  Tuesday June 22 2015 15:44:15 EDT Ventricular Rate:  70 PR Interval:  170 QRS Duration: 82 QT Interval:  396 QTC Calculation: 427 R Axis:   83 Text Interpretation:  Normal sinus rhythm Right atrial enlargement  Borderline ECG No significant change since Confirmed by POLLINA  MD,  CHRISTOPHER [phone]) on 06/22/2015 4:20:02 PM      MDM   Final diagnoses:  Coronary artery disease involving native coronary artery of native heart with angina pectoris  Unstable angina  Elevated  troponin    The history is provided by the patient and  medical records. No language interpreter was used.   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 Vital signs are stable but story is concerning given no improvement with pain with rest. Initial troponin elevated at 0.18, unclear if this is active ischemia vs related to recent cardiac catheterization. No significant change in EKG from prior. Called and discussed with Dr. Wynonia Lawman with cardiology who, following evaluation, admitted the patient for further management. Patient aware of plan and is in agreement.     Theodosia Quay, MD 06/24/15 0000  Orpah Greek, MD 06/28/15 1031

## 2015-06-22 NOTE — ED Notes (Signed)
Pt presents to department for evaluation of diffuse chest pain. 3/10 pain upon arrival to ED. Recently had cardiac stent placed on Friday by Dr. Tamala Julian. Respirations unlabored. Pt is alert and oriented x4.

## 2015-06-22 NOTE — ED Notes (Signed)
Dr. Tilley at bedside. 

## 2015-06-22 NOTE — ED Notes (Signed)
RN explained to pt that he needed 2nd IV access for heparin, pt states "I am eating right now, can you wait. I haven't eaten all day." RN will start IV after pt finished eating.

## 2015-06-22 NOTE — Progress Notes (Signed)
ANTICOAGULATION CONSULT NOTE - Initial Consult  Pharmacy Consult for heparin Indication: chest pain/ACS  No Known Allergies  Patient Measurements:   Heparin Dosing Weight: 68 kg  Vital Signs: Temp: 97.4 F (36.3 C) (08/09 1545) Temp Source: Oral (08/09 1545) BP: 118/72 mmHg (08/09 1900) Pulse Rate: 58 (08/09 1900)  Labs:  Recent Labs  06/22/15 1551  HGB 10.7*  HCT 33.4*  PLT 186  CREATININE 1.06    Estimated Creatinine Clearance: 56 mL/min (by C-G formula based on Cr of 1.06).   Medical History: Past Medical History  Diagnosis Date  . Hyperlipidemia   . Esophageal reflux   . Prostate cancer   . Hx of radiation therapy 04/14/13-06/09/13    prostate 7800 cGy, 40 sessions, seminal vesicles 5600 cGy 40 sessions  . CAD (coronary artery disease)     a.  LHC 8/16: Mid to distal LAD 30%, OM1 40%, proximal mid RCA 40%, distal RCA 60% >> FFR 0.69  >> PCI: 3 x 15 mm Resolute DES  . Aortic stenosis     a. peak to peak gradient by LHC 8/16:  37 mmHg (moderately severe)     Medications:   (Not in a hospital admission) Scheduled:   Infusions:  . nitroGLYCERIN 5 mcg/min (06/22/15 1818)    Assessment: 79yo M with history of HLD, GERD, Prostate cancer, CAD and aortic stenosis presents with ACS. Pharmacy is consulted to dose heparin. Hgb 10.7, plts 186, Scr 1.06,  Troponin 0.18  Goal of Therapy:  Heparin level 0.3-0.7 units/ml Monitor platelets by anticoagulation protocol: Yes   Plan:  Give 4000 units bolus x 1 Start heparin infusion at 800 units/hr Check anti-Xa level in 8 hours and daily while on heparin Continue to monitor H&H and platelets  Dimitri Ped, PharmD. Clinical Pharmacist Resident Pager: [phone]

## 2015-06-22 NOTE — H&P (Signed)
History and Physical   Admit date: 06/22/2015 Name:  Corey Oneill Medical record number: [id number] DOB/Age:  1936/01/15  79 y.o. male  Referring Physician:   [facility]  Primary Cardiologist: Wynonia Lawman  Primary Physician:   Dr. Redmond School  Chief complaint/reason for admission: Chest pain at rest   HPI:  79 year old male who is had several month history of exertional angina.  He also has moderate aortic stenosis.  We tried to manage him medically but because of an acceptable symptoms he was admitted with progressive symptoms and last Friday at catheterization was found to have a significant stenosis in the distal right coronary artery with an abnormal FFR.  He had moderate stenosis in the intermediate branch but the FFR was not severe enough for work.  He had placement of a 3.0 x 15 mm resolute stent to the distal right coronary artery by Dr. Tamala Julian postdilated to 3.5 mm.  He was discharged on BRILINTA and aspirin and has been taking his medications regularly.  Since discharge she has had improvement in the prior exertional angina that he had and has gone several walks it previously would pursue that angina but which no longer do so.  He went to get his urinate check this morning and then came back, was sitting in his best and paperwork had the onset of midsternal chest discomfort similar to his previous symptoms.  The discomfort occurred at rest and radiated across his chest.  He took one nitroglycerin and waited an hour and then took a second one and may have had some resolution of the pain but was advised to come to the emergency room.  At the present time he is largely pain-free and EKG is unremarkable although troponin is 0.18.  On his most recent admission troponins were 0.  EKG was unremarkable.  He may have some mild pleuritic pain at the present time and he takes a deep breath but doesn't have significant chest pain that is ongoing.    Past Medical History  Diagnosis Date  . Hyperlipidemia    . Esophageal reflux   . Prostate cancer   . Hx of radiation therapy 04/14/13-06/09/13    prostate 7800 cGy, 40 sessions, seminal vesicles 5600 cGy 40 sessions  . CAD (coronary artery disease)     a.  LHC 8/16: Mid to distal LAD 30%, OM1 40%, proximal mid RCA 40%, distal RCA 60% >> FFR 0.69  >> PCI: 3 x 15 mm Resolute DES  . Aortic stenosis     a. peak to peak gradient by LHC 8/16:  37 mmHg (moderately severe)      Past Surgical History  Procedure Laterality Date  . Nose surgery    . Tonsillectomy    . Needle biopsy of prostate    . Colonoscopy    . Prostate surgery    . Cardiac catheterization N/A 04/21/2015    Procedure: Right/Left Heart Cath and Coronary Angiography;  Surgeon: [name], MD;  Location: [place] [facility];  Service: Cardiovascular;  Laterality: N/A;  . Cardiac catheterization N/A 06/18/2015    Procedure: Intravascular Pressure Wire/FFR Study;  Surgeon: [name], MD;  Location: [place] [facility];  Service: Cardiovascular;  Laterality: N/A;  . Cardiac catheterization N/A 06/18/2015    Procedure: Coronary Stent Intervention;  Surgeon: [name], MD;  Location: [place] [facility];  Service: Cardiovascular;  Laterality: N/A;  . Cardiac catheterization N/A 06/18/2015    Procedure: Right/Left Heart Cath and Coronary Angiography;  Surgeon: [name], MD;  Location: [place] [facility];  Service: Cardiovascular;  Laterality: N/A;  .  Allergies: has No Known Allergies.   Medications: Prior to Admission medications   Medication Sig Start Date End Date Taking? Authorizing Provider  aspirin 81 MG tablet Take 81 mg by mouth daily.    Historical Provider, MD  buPROPion (WELLBUTRIN SR) 100 MG 12 hr tablet TAKE 1 TABLET TWICE DAILY. 06/17/15   [name], MD  FLUoxetine (PROZAC) 10 MG capsule Take 1 capsule (10 mg total) by mouth daily. 03/22/15   [name], MD  Magnesium 250 MG TABS Take 1 tablet by mouth daily.    Historical Provider, MD  metoprolol succinate  (TOPROL-XL) 25 MG 24 hr tablet Take 1/2 tablet at bedtime. 06/19/15   Liliane Shi, PA-C  omeprazole (PRILOSEC) 20 MG capsule Take 20 mg by mouth daily.    Historical Provider, MD  simvastatin (ZOCOR) 40 MG tablet Take 40 mg by mouth every other day.     Historical Provider, MD  ticagrelor (BRILINTA) 90 MG TABS tablet Take 1 tablet (90 mg total) by mouth 2 (two) times daily. 06/19/15   Liliane Shi, PA-C  vitamin B-12 (CYANOCOBALAMIN) 1000 MCG tablet Take 1,000 mcg by mouth See admin instructions. Take 6,072mcg daily per patient    Historical Provider, MD    Family History:  Family Status  Relation Status Death Age  . Father Deceased 75  . Mother Deceased 57    Social History:   reports that he quit smoking about 47 years ago. He has never used smokeless tobacco. He reports that he drinks about 7.5 oz of alcohol per week. He reports that he does not use illicit drugs.   History   Social History Narrative     Review of Systems:  He has radiation proctitis and may have had some recent rectal bleeding he has noted some blood in his stool since the procedure. Other than as noted above, the remainder of the review of systems is normal  Physical Exam: BP 120/57 mmHg  Pulse 61  Temp(Src) 97.4 F (36.3 C) (Oral)  Resp 16  SpO2 100% General appearance: Elderly male currently in no acute distress Head: Normocephalic, without obvious abnormality, atraumatic, Balding male hair pattern Eyes: conjunctivae/corneas clear. PERRL, EOM's intact. Fundi not examined  Neck: no adenopathy, no carotid bruit, no JVD and supple, symmetrical, trachea midline Lungs: clear to auscultation bilaterally Heart: Regular rate and rhythm, normal S1 and S2, 1 to 2/6 systolic murmur at aortic area Abdomen: soft, non-tender; bowel sounds normal; no masses,  no organomegaly Rectal: deferred Extremities: extremities normal, atraumatic, no cyanosis or edema Pulses: 2+ and symmetric Skin: Skin color, texture, turgor  normal. No rashes or lesions Neurologic: Grossly normal  Labs: CBC  Recent Labs  06/22/15 1551  WBC 8.2  RBC 4.02*  HGB 10.7*  HCT 33.4*  PLT 186  MCV 83.1  MCH 26.6  MCHC 32.0  RDW 16.2*  LYMPHSABS 0.9  MONOABS 0.9  EOSABS 0.2  BASOSABS 0.0   CMP   Recent Labs  06/22/15 1551  NA 133*  K 4.2  CL 100*  CO2 24  GLUCOSE 146*  BUN 18  CREATININE 1.06  CALCIUM 8.8*  PROT 6.6  ALBUMIN 3.6  AST 22  ALT 12*  ALKPHOS 64  BILITOT 0.7  GFRNONAA >60  GFRAA >60   BNP (last 3 results)  Cardiac Panel (last 3 results) Troponin (Point of Care Test)  Recent Labs  06/22/15 1608  TROPIPOC 0.18*   Thyroid  Lab Results  Component Value Date   TSH 2.42 07/04/2007    EKG: Normal sinus rhythm without acute changes  Radiology: No changes   IMPRESSIONS: 1.  Unstable angina pectoris with chest pain occurring at rest in a patient with recent stent implantation with abnormal troponin-difficult to tell if this is an acute coronary syndrome or residual troponin elevation following stenting 2.  Coronary artery disease 3.  Moderate to severe aortic stenosis 4.  Rectal bleeding due to radiation proctitis 5.  History of prostate cancer 6.  Hyperlipidemia 7.  Dizziness  PLAN: Begin IV nitroglycerin.  Continue BRILINTA and aspirin.  He does have some rectal bleeding somewhat hold off on heparin and await troponins.  If troponins are elevated at or go up may need to heparinize.  May need repeat catheterization to check the stent site.  Signed: Kerry Hough MD [facility]  06/22/2015, 5:38 PM

## 2015-06-22 NOTE — ED Provider Notes (Signed)
Patient presented to the ER with chest pain. Patient has history of coronary artery disease. Patient had cardiac catheterization with stenting last week. Patient started having recurrent left-sided chest pain similar to pain he has had with his heart disease in the past.  Face to face Exam: HEENT - PERRLA Lungs - CTAB Heart - RRR, no M/R/G Abd - S/NT/ND Neuro - alert, oriented x3  Plan: Elevated troponin, unclear if this is secondary to acute coronary syndrome or recent stenting. Consult patient's cardiologist for further management.  Orpah Greek, MD 06/22/15 [phone]

## 2015-06-22 NOTE — Progress Notes (Signed)
Pt having blood from his penis with and without urinating. Pt very concerned d/t his history. Dr Philbert Riser made aware. Will cont to monitor.

## 2015-06-23 ENCOUNTER — Encounter (HOSPITAL_COMMUNITY): Admission: RE | Admit: 2015-06-23 | Payer: Medicare Other | Source: Ambulatory Visit

## 2015-06-23 ENCOUNTER — Encounter (HOSPITAL_COMMUNITY)
Admission: EM | Disposition: A | Discharge status: Home / Self Care | Payer: Self-pay | Source: Home / Self Care | Attending: Cardiology

## 2015-06-23 DIAGNOSIS — D649 Anemia, unspecified: Secondary | ICD-10-CM | POA: Diagnosis present

## 2015-06-23 DIAGNOSIS — I2510 Atherosclerotic heart disease of native coronary artery without angina pectoris: Secondary | ICD-10-CM | POA: Diagnosis not present

## 2015-06-23 DIAGNOSIS — K627 Radiation proctitis: Secondary | ICD-10-CM | POA: Diagnosis present

## 2015-06-23 DIAGNOSIS — I350 Nonrheumatic aortic (valve) stenosis: Secondary | ICD-10-CM | POA: Diagnosis present

## 2015-06-23 DIAGNOSIS — Z79899 Other long term (current) drug therapy: Secondary | ICD-10-CM | POA: Diagnosis not present

## 2015-06-23 DIAGNOSIS — Z7982 Long term (current) use of aspirin: Secondary | ICD-10-CM | POA: Diagnosis not present

## 2015-06-23 DIAGNOSIS — I214 Non-ST elevation (NSTEMI) myocardial infarction: Secondary | ICD-10-CM | POA: Diagnosis present

## 2015-06-23 DIAGNOSIS — Z923 Personal history of irradiation: Secondary | ICD-10-CM | POA: Diagnosis not present

## 2015-06-23 DIAGNOSIS — Z87891 Personal history of nicotine dependence: Secondary | ICD-10-CM | POA: Diagnosis not present

## 2015-06-23 DIAGNOSIS — I25110 Atherosclerotic heart disease of native coronary artery with unstable angina pectoris: Secondary | ICD-10-CM | POA: Diagnosis present

## 2015-06-23 DIAGNOSIS — Z955 Presence of coronary angioplasty implant and graft: Secondary | ICD-10-CM | POA: Diagnosis not present

## 2015-06-23 DIAGNOSIS — I25118 Atherosclerotic heart disease of native coronary artery with other forms of angina pectoris: Secondary | ICD-10-CM | POA: Diagnosis not present

## 2015-06-23 DIAGNOSIS — R7989 Other specified abnormal findings of blood chemistry: Secondary | ICD-10-CM

## 2015-06-23 DIAGNOSIS — E785 Hyperlipidemia, unspecified: Secondary | ICD-10-CM | POA: Diagnosis present

## 2015-06-23 DIAGNOSIS — K219 Gastro-esophageal reflux disease without esophagitis: Secondary | ICD-10-CM | POA: Diagnosis present

## 2015-06-23 DIAGNOSIS — R778 Other specified abnormalities of plasma proteins: Secondary | ICD-10-CM | POA: Insufficient documentation

## 2015-06-23 DIAGNOSIS — Z8546 Personal history of malignant neoplasm of prostate: Secondary | ICD-10-CM | POA: Diagnosis not present

## 2015-06-23 HISTORY — PX: CARDIAC CATHETERIZATION: SHX172

## 2015-06-23 LAB — CBC WITH DIFFERENTIAL/PLATELET
BASOS ABS: 0.1 10*3/uL (ref 0.0–0.1)
Basophils Relative: 1 % (ref 0–1)
Eosinophils Absolute: 0.2 10*3/uL (ref 0.0–0.7)
Eosinophils Relative: 3 % (ref 0–5)
HEMATOCRIT: 31.1 % — AB (ref 39.0–52.0)
HEMOGLOBIN: 10 g/dL — AB (ref 13.0–17.0)
LYMPHS PCT: 20 % (ref 12–46)
Lymphs Abs: 1.2 10*3/uL (ref 0.7–4.0)
MCH: 26.7 pg (ref 26.0–34.0)
MCHC: 32.2 g/dL (ref 30.0–36.0)
MCV: 82.9 fL (ref 78.0–100.0)
Monocytes Absolute: 0.7 10*3/uL (ref 0.1–1.0)
Monocytes Relative: 12 % (ref 3–12)
NEUTROS ABS: 3.6 10*3/uL (ref 1.7–7.7)
Neutrophils Relative %: 64 % (ref 43–77)
Platelets: 172 10*3/uL (ref 150–400)
RBC: 3.75 MIL/uL — ABNORMAL LOW (ref 4.22–5.81)
RDW: 16.5 % — AB (ref 11.5–15.5)
WBC: 5.8 10*3/uL (ref 4.0–10.5)

## 2015-06-23 LAB — CBC
HCT: 30.6 % — ABNORMAL LOW (ref 39.0–52.0)
HEMOGLOBIN: 9.8 g/dL — AB (ref 13.0–17.0)
MCH: 26.3 pg (ref 26.0–34.0)
MCHC: 32 g/dL (ref 30.0–36.0)
MCV: 82 fL (ref 78.0–100.0)
Platelets: 169 10*3/uL (ref 150–400)
RBC: 3.73 MIL/uL — ABNORMAL LOW (ref 4.22–5.81)
RDW: 16.3 % — AB (ref 11.5–15.5)
WBC: 6.1 10*3/uL (ref 4.0–10.5)

## 2015-06-23 LAB — URINALYSIS, ROUTINE W REFLEX MICROSCOPIC
Bilirubin Urine: NEGATIVE
Glucose, UA: NEGATIVE mg/dL
KETONES UR: NEGATIVE mg/dL
LEUKOCYTES UA: NEGATIVE
NITRITE: NEGATIVE
Protein, ur: NEGATIVE mg/dL
Specific Gravity, Urine: 1.009 (ref 1.005–1.030)
Urobilinogen, UA: 0.2 mg/dL (ref 0.0–1.0)
pH: 7.5 (ref 5.0–8.0)

## 2015-06-23 LAB — URINE MICROSCOPIC-ADD ON

## 2015-06-23 LAB — BASIC METABOLIC PANEL
Anion gap: 6 (ref 5–15)
BUN: 17 mg/dL (ref 6–20)
CALCIUM: 8.7 mg/dL — AB (ref 8.9–10.3)
CHLORIDE: 101 mmol/L (ref 101–111)
CO2: 29 mmol/L (ref 22–32)
Creatinine, Ser: 1.09 mg/dL (ref 0.61–1.24)
GFR calc Af Amer: >60 mL/min (ref >60)
GFR calc non Af Amer: >60 mL/min (ref >60)
Glucose, Bld: 103 mg/dL — ABNORMAL HIGH (ref 65–99)
Potassium: 4.6 mmol/L (ref 3.5–5.1)
SODIUM: 136 mmol/L (ref 135–145)

## 2015-06-23 LAB — TROPONIN I
TROPONIN I: 0.16 ng/mL — AB (ref <0.031)
Troponin I: 0.21 ng/mL — ABNORMAL HIGH (ref <0.031)

## 2015-06-23 LAB — HEPARIN LEVEL (UNFRACTIONATED)
HEPARIN UNFRACTIONATED: 0.19 [IU]/mL — AB (ref 0.30–0.70)
Heparin Unfractionated: 0.24 IU/mL — ABNORMAL LOW (ref 0.30–0.70)

## 2015-06-23 SURGERY — LEFT HEART CATH AND CORS/GRAFTS ANGIOGRAPHY

## 2015-06-23 MED ORDER — ASPIRIN 81 MG PO CHEW
81.0000 mg | CHEWABLE_TABLET | ORAL | Status: AC
  Administered 2015-06-23: 81 mg via ORAL
  Filled 2015-06-23: qty 1

## 2015-06-23 MED ORDER — LIDOCAINE HCL (PF) 1 % IJ SOLN
INTRAMUSCULAR | Status: DC | PRN
  Administered 2015-06-23: 17:00:00

## 2015-06-23 MED ORDER — OXYCODONE-ACETAMINOPHEN 5-325 MG PO TABS: 1.0000 | ORAL_TABLET | ORAL | Status: DC | PRN

## 2015-06-23 MED ORDER — SODIUM CHLORIDE 0.9 % IJ SOLN: 3.0000 mL | INTRAMUSCULAR | Status: DC | PRN

## 2015-06-23 MED ORDER — ACETAMINOPHEN 325 MG PO TABS: 650.0000 mg | ORAL_TABLET | ORAL | Status: DC | PRN

## 2015-06-23 MED ORDER — VERAPAMIL HCL 2.5 MG/ML IV SOLN
INTRAVENOUS | Status: AC
  Filled 2015-06-23: qty 2

## 2015-06-23 MED ORDER — HEPARIN SODIUM (PORCINE) 1000 UNIT/ML IJ SOLN
INTRAMUSCULAR | Status: AC
  Filled 2015-06-23: qty 1

## 2015-06-23 MED ORDER — VERAPAMIL HCL 2.5 MG/ML IV SOLN
INTRAVENOUS | Status: DC | PRN
  Administered 2015-06-23: 16:00:00 via INTRA_ARTERIAL

## 2015-06-23 MED ORDER — SODIUM CHLORIDE 0.9 % IJ SOLN: 3.0000 mL | Freq: Two times a day (BID) | INTRAMUSCULAR | Status: DC

## 2015-06-23 MED ORDER — MIDAZOLAM HCL 2 MG/2ML IJ SOLN
INTRAMUSCULAR | Status: AC
  Filled 2015-06-23: qty 4

## 2015-06-23 MED ORDER — SODIUM CHLORIDE 0.9 % WEIGHT BASED INFUSION
3.0000 mL/kg/h | INTRAVENOUS | Status: AC
  Administered 2015-06-23: 3 mL/kg/h via INTRAVENOUS

## 2015-06-23 MED ORDER — IOHEXOL 300 MG/ML  SOLN
INTRAMUSCULAR | Status: DC | PRN
  Administered 2015-06-23: 85 mL via INTRAVENOUS

## 2015-06-23 MED ORDER — SODIUM CHLORIDE 0.9 % WEIGHT BASED INFUSION: 3.0000 mL/kg/h | INTRAVENOUS | Status: DC

## 2015-06-23 MED ORDER — ASPIRIN 81 MG PO CHEW: 81.0000 mg | CHEWABLE_TABLET | ORAL | Status: DC

## 2015-06-23 MED ORDER — ALPRAZOLAM 0.25 MG PO TABS
0.2500 mg | ORAL_TABLET | ORAL | Status: AC
  Administered 2015-06-23: 0.25 mg via ORAL

## 2015-06-23 MED ORDER — FENTANYL CITRATE (PF) 100 MCG/2ML IJ SOLN
INTRAMUSCULAR | Status: AC
Start: 2015-06-23 — End: 2015-06-23
  Filled 2015-06-23: qty 4

## 2015-06-23 MED ORDER — LIDOCAINE HCL (PF) 1 % IJ SOLN
INTRAMUSCULAR | Status: AC
  Filled 2015-06-23: qty 30

## 2015-06-23 MED ORDER — HEPARIN (PORCINE) IN NACL 2-0.9 UNIT/ML-% IJ SOLN
INTRAMUSCULAR | Status: AC
  Filled 2015-06-23: qty 1000

## 2015-06-23 MED ORDER — SODIUM CHLORIDE 0.9 % WEIGHT BASED INFUSION
1.0000 mL/kg/h | INTRAVENOUS | Status: DC
  Administered 2015-06-23: 100 mL via INTRAVENOUS

## 2015-06-23 MED ORDER — SODIUM CHLORIDE 0.9 % IJ SOLN
3.0000 mL | Freq: Two times a day (BID) | INTRAMUSCULAR | Status: DC
  Administered 2015-06-24: 3 mL via INTRAVENOUS

## 2015-06-23 MED ORDER — SODIUM CHLORIDE 0.9 % IV SOLN: 250.0000 mL | INTRAVENOUS | Status: DC | PRN

## 2015-06-23 MED ORDER — RANOLAZINE ER 500 MG PO TB12
500.0000 mg | ORAL_TABLET | Freq: Two times a day (BID) | ORAL | Status: DC
  Administered 2015-06-23 – 2015-06-24 (×2): 500 mg via ORAL
  Filled 2015-06-23 (×2): qty 1

## 2015-06-23 MED ORDER — MIDAZOLAM HCL 2 MG/2ML IJ SOLN
INTRAMUSCULAR | Status: DC | PRN
  Administered 2015-06-23: 1 mg via INTRAVENOUS

## 2015-06-23 MED ORDER — ALPRAZOLAM 0.25 MG PO TABS
ORAL_TABLET | ORAL | Status: AC
  Filled 2015-06-23: qty 1

## 2015-06-23 MED ORDER — ALPRAZOLAM 0.25 MG PO TABS
0.2500 mg | ORAL_TABLET | Freq: Every evening | ORAL | Status: DC | PRN
  Administered 2015-06-23: 0.25 mg via ORAL
  Filled 2015-06-23: qty 1

## 2015-06-23 MED ORDER — FENTANYL CITRATE (PF) 100 MCG/2ML IJ SOLN
INTRAMUSCULAR | Status: DC | PRN
  Administered 2015-06-23: 50 ug via INTRAVENOUS

## 2015-06-23 MED ORDER — HEPARIN SODIUM (PORCINE) 1000 UNIT/ML IJ SOLN
INTRAMUSCULAR | Status: DC | PRN
  Administered 2015-06-23: 3500 [IU] via INTRAVENOUS

## 2015-06-23 SURGICAL SUPPLY — 9 items
CATH INFINITI 5 FR JL3.5 (CATHETERS) ×3 IMPLANT
CATH INFINITI JR4 5F (CATHETERS) ×3 IMPLANT
DEVICE RAD COMP TR BAND LRG (VASCULAR PRODUCTS) ×3 IMPLANT
GLIDESHEATH SLEND A-KIT 6F 22G (SHEATH) ×3 IMPLANT
KIT HEART LEFT (KITS) ×3 IMPLANT
PACK CARDIAC CATHETERIZATION (CUSTOM PROCEDURE TRAY) ×3 IMPLANT
TRANSDUCER W/STOPCOCK (MISCELLANEOUS) ×3 IMPLANT
TUBING CIL FLEX 10 FLL-RA (TUBING) ×3 IMPLANT
WIRE SAFE-T 1.5MM-J .035X260CM (WIRE) ×3 IMPLANT

## 2015-06-23 NOTE — Progress Notes (Signed)
Pt c/o some anxiety and inability to sleep. Dr Philbert Riser made aware and orders received. Will medicate and cont to monitor.

## 2015-06-23 NOTE — Progress Notes (Signed)
Subjective:  He had resolution of chest pain.  Was begun on heparin last night.  Troponin has increased to 0.2.  It was normal last week when he had the stent.  EKG is normal today.  He also had bleeding through his penis last night and has a 1. drop in hemoglobin this morning.  Currently on heparin and nitroglycerin.  Objective:  Vital Signs in the last 24 hours: BP 111/63 mmHg  Pulse 67  Temp(Src) 97.4 F (36.3 C) (Oral)  Resp 16  Ht 6' (1.829 m)  Wt 67.405 kg (148 lb 9.6 oz)  BMI 20.15 kg/m2  SpO2 98%  Physical Exam: Pleasant male currently in no acute distress Lungs:  Clear Cardiac:  Regular rhythm, normal S1 and S2, no S3 Extremities:  No edema present  Intake/Output from previous day: 08/09 0701 - 08/10 0700 In: 198.8 [P.O.:120; I.V.:78.8] Out: 200 [Urine:200]  Weight Filed Weights   06/22/15 2123  Weight: 67.405 kg (148 lb 9.6 oz)    Lab Results: Basic Metabolic Panel:  Recent Labs  06/22/15 1551 06/23/15 0356  NA 133* 136  K 4.2 4.6  CL 100* 101  CO2 24 29  GLUCOSE 146* 103*  BUN 18 17  CREATININE 1.06 1.09   CBC:  Recent Labs  06/22/15 1551 06/23/15 0356  WBC 8.2 6.1  NEUTROABS 6.2  --   HGB 10.7* 9.8*  HCT 33.4* 30.6*  MCV 83.1 82.0  PLT 186 169   Cardiac Enzymes: Troponin (Point of Care Test)  Recent Labs  06/22/15 1608  TROPIPOC 0.18*   Cardiac Panel (last 3 results)  Recent Labs  06/22/15 2238 06/23/15 0356  TROPONINI 0.24* 0.21*    Telemetry: Sinus rhythm   Assessment/Plan: 1.  Non-STEMI fairly soon after recent implantation of a stent to the right coronary artery 2.  Urinary bleeding last night 3.  Anemia 4.  Coronary artery disease  Recommendations:  I think he needs to have repeat catheterization to assess the stented site in the right coronary artery. Cardiac catheterization procedure was discussed with the patient fully including risks of myocardial infarction, death, stroke, bleeding, arrhythmia, dye allergy,  or renal insufficiency. The patient understands and is willing to proceed.  Have asked colleague from [facility] heart care to assist.      W. Doristine Church  MD [facility]  06/23/2015, 8:31 AM

## 2015-06-23 NOTE — Progress Notes (Signed)
ANTICOAGULATION CONSULT NOTE - Follow-up Consult  Pharmacy Consult for heparin Indication: chest pain/ACS  No Known Allergies  Patient Measurements: Height: 6' (182.9 cm) Weight: 148 lb 9.6 oz (67.405 kg) IBW/kg (Calculated) : 77.6 Heparin Dosing Weight: 68 kg  Vital Signs: Temp: 97.7 F (36.5 C) (08/10 0000) Temp Source: Oral (08/10 0000) BP: 113/74 mmHg (08/10 0130) Pulse Rate: 66 (08/10 0000)  Labs:  Recent Labs  06/22/15 1551 06/22/15 2238 06/23/15 0356  HGB 10.7*  --  9.8*  HCT 33.4*  --  30.6*  PLT 186  --  169  HEPARINUNFRC  --   --  0.19*  CREATININE 1.06  --  1.09  TROPONINI  --  0.24* 0.21*    Estimated Creatinine Clearance: 54.1 mL/min (by C-G formula based on Cr of 1.09).   Assessment: 79yo M on heparin for elevated troponin. Heparin level subtherapeutic (0.19) on 800 units/hr. Hgb down a bit. No issues with line per RN. Pt with hematuria - RN will let us know if it worsens.  Goal of Therapy:  Heparin level 0.3-0.7 units/ml Monitor platelets by anticoagulation protocol: Yes   Plan:  Increase heparin gtt to 1000 units/hr Will f/u 8 hr HL  Sherlon Handing, PharmD, BCPS Clinical pharmacist, pager [phone] 06/23/2015 5:13 AM

## 2015-06-23 NOTE — H&P (View-Only) (Signed)
Subjective:  He had resolution of chest pain.  Was begun on heparin last night.  Troponin has increased to 0.2.  It was normal last week when he had the stent.  EKG is normal today.  He also had bleeding through his penis last night and has a 1. drop in hemoglobin this morning.  Currently on heparin and nitroglycerin.  Objective:  Vital Signs in the last 24 hours: BP 111/63 mmHg  Pulse 67  Temp(Src) 97.4 F (36.3 C) (Oral)  Resp 16  Ht 6' (1.829 m)  Wt 67.405 kg (148 lb 9.6 oz)  BMI 20.15 kg/m2  SpO2 98%  Physical Exam: Pleasant male currently in no acute distress Lungs:  Clear Cardiac:  Regular rhythm, normal S1 and S2, no S3 Extremities:  No edema present  Intake/Output from previous day: 08/09 0701 - 08/10 0700 In: 198.8 [P.O.:120; I.V.:78.8] Out: 200 [Urine:200]  Weight Filed Weights   06/22/15 2123  Weight: 67.405 kg (148 lb 9.6 oz)    Lab Results: Basic Metabolic Panel:  Recent Labs  06/22/15 1551 06/23/15 0356  NA 133* 136  K 4.2 4.6  CL 100* 101  CO2 24 29  GLUCOSE 146* 103*  BUN 18 17  CREATININE 1.06 1.09   CBC:  Recent Labs  06/22/15 1551 06/23/15 0356  WBC 8.2 6.1  NEUTROABS 6.2  --   HGB 10.7* 9.8*  HCT 33.4* 30.6*  MCV 83.1 82.0  PLT 186 169   Cardiac Enzymes: Troponin (Point of Care Test)  Recent Labs  06/22/15 1608  TROPIPOC 0.18*   Cardiac Panel (last 3 results)  Recent Labs  06/22/15 2238 06/23/15 0356  TROPONINI 0.24* 0.21*    Telemetry: Sinus rhythm   Assessment/Plan: 1.  Non-STEMI fairly soon after recent implantation of a stent to the right coronary artery 2.  Urinary bleeding last night 3.  Anemia 4.  Coronary artery disease  Recommendations:  I think he needs to have repeat catheterization to assess the stented site in the right coronary artery. Cardiac catheterization procedure was discussed with the patient fully including risks of myocardial infarction, death, stroke, bleeding, arrhythmia, dye allergy,  or renal insufficiency. The patient understands and is willing to proceed.  Have asked colleague from [facility] heart care to assist.      W. Doristine Church  MD [facility]  06/23/2015, 8:31 AM

## 2015-06-23 NOTE — Interval H&P Note (Signed)
Cath Lab Visit (complete for each Cath Lab visit)  Clinical Evaluation Leading to the Procedure:   ACS: Yes.    Non-ACS:    Anginal Classification: CCS IV  Anti-ischemic medical therapy: Maximal Therapy (2 or more classes of medications)  Non-Invasive Test Results: No non-invasive testing performed  Prior CABG: No previous CABG      History and Physical Interval Note:  06/23/2015 4:07 PM  Corey Oneill  has presented today for surgery, with the diagnosis of cp  The various methods of treatment have been discussed with the patient and family. After consideration of risks, benefits and other options for treatment, the patient has consented to  Procedure(s): Left Heart Cath and Cors/Grafts Angiography (N/A) as a surgical intervention .  The patient's history has been reviewed, patient examined, no change in status, stable for surgery.  I have reviewed the patient's chart and labs.  Questions were answered to the patient's satisfaction.     Sinclair Grooms

## 2015-06-23 NOTE — Progress Notes (Signed)
Appreciate help of Dr. Tamala Julian.  Films reviewed.  Widely patent stent site.  Unclear what prolonged episode of pain at rest was yesterday.  Was associated with troponin elevation but difficult to tell if this was residual from the previous stent or a new problem.  I am going to add Ranexa to his regimen.  He did not tolerate nitrates well previously.  Get rehabilitation to see and get him involved in rehabilitation.  Kerry Hough MD [facility]

## 2015-06-24 ENCOUNTER — Encounter (HOSPITAL_COMMUNITY): Payer: Self-pay | Admitting: Interventional Cardiology

## 2015-06-24 LAB — CBC
HEMATOCRIT: 31.4 % — AB (ref 39.0–52.0)
Hemoglobin: 9.9 g/dL — ABNORMAL LOW (ref 13.0–17.0)
MCH: 26.5 pg (ref 26.0–34.0)
MCHC: 31.5 g/dL (ref 30.0–36.0)
MCV: 84.2 fL (ref 78.0–100.0)
Platelets: 184 10*3/uL (ref 150–400)
RBC: 3.73 MIL/uL — AB (ref 4.22–5.81)
RDW: 16.6 % — AB (ref 11.5–15.5)
WBC: 4.5 10*3/uL (ref 4.0–10.5)

## 2015-06-24 MED ORDER — RANOLAZINE ER 500 MG PO TB12: 500.0000 mg | ORAL_TABLET | Freq: Two times a day (BID) | ORAL | Status: DC

## 2015-06-24 MED ORDER — NITROGLYCERIN 0.4 MG SL SUBL: 0.4000 mg | SUBLINGUAL_TABLET | SUBLINGUAL | Status: DC | PRN

## 2015-06-24 NOTE — Progress Notes (Signed)
CARDIAC REHAB PHASE I   PRE:  Rate/Rhythm: NT  BP:  Sitting: 117/63        SaO2: 99 RA  MODE:  Ambulation: 850 ft   POST:  Rate/Rhythm: 72   BP:  Sitting: 126/68         SaO2: 100 RA  Pt ambulated 850 ft on RA, independent, steady gait, tolerated well.  Pt c/o of "feeling woozy" since this morning (question side effect of ranexa, RN aware), denies cp, DOE, declined rest stop. Completed MI education. Pt states he was not told he had a heart attack. Reviewed risk factors, MI book, anti-platelet therapy, activity restrictions, ntg, exercise, heart healthy diet, sodium restrictions and phase 2 cardiac rehab. Pt verbalized understanding. Pt currently in [facility] cardiac rehab, per Dr. Wynonia Lawman pt ok to resume class Monday. Pt to bed after walk, call bell within reach.  [phone]   Lenna Sciara, RN, BSN 06/24/2015 11:15 AM

## 2015-06-24 NOTE — Discharge Summary (Signed)
Physician Discharge Summary  Patient ID: Corey Oneill MRN: [id number] DOB/AGE: [birth year] 79 y.o.  Admit date: 06/22/2015 Discharge date: 06/24/2015  Primary Physician:  Dr. Redmond School   Primary Discharge Diagnosis:  1.  Non-STEMI-possible etiologies could include intermittent occlusion of a very small distal side branch of the right coronary artery-stent site was patent this admission  Secondary Discharge Diagnosis: 2.  Coronary artery disease with previous drug-eluting stent to the distal right coronary artery, moderate stenosis involving the first marginal branch 3.  Moderate to severe aortic stenosis 4.  Weight loss needs investigation 5.  Anemia 6.  Urinary bleeding 7.  History of prostate cancer 8.  Hyperlipidemia  Procedures:  Cardiac catheterization-Dr. Laurena Bering Course: 79 year old male who is had several month history of exertional angina. He also has moderate aortic stenosis. We tried to manage him medically but because of an acceptable symptoms he was admitted with progressive symptoms and last Friday at catheterization was found to have a significant stenosis in the distal right coronary artery with an abnormal FFR. He had moderate stenosis in the intermediate branch but the FFR was not severe enough for work. He had placement of a 3.0 x 15 mm resolute stent to the distal right coronary artery by Dr. Tamala Julian postdilated to 3.5 mm. He was discharged on BRILINTA and aspirin and has been taking his medications regularly. Since discharge she has had improvement in the prior exertional angina that he had and has gone several walks it previously would pursue that angina but which no longer do so. He went to get his urinate check this morning and then came back, was sitting in his best and paperwork had the onset of midsternal chest discomfort similar to his previous symptoms. The discomfort occurred at rest and radiated across his chest. He took one nitroglycerin and waited an  hour and then took a second one and may have had some resolution of the pain but was advised to come to the emergency room. At time of admission he was largely pain-free and EKG is unremarkable although troponin is 0.18. On his most recent admission troponins were 0. EKG was unremarkable. He may have had some mild pleuritic pain on admission if he takes a deep breath but doesn't have significant chest pain that is ongoing.  The patient had elevation of troponin consistent with a non-STEMI.  EKG remained unremarkable.  Because of his his recent stent implantation in elevations of troponin and chest pain at rest repeat catheterization was advised.  This was performed on 810 by Dr. Tamala Julian.  The stent site was patent in the distal right coronary artery.  The LAD had mild disease in the intermediate branch had moderate stenosis unchanged angiographically as well as mild to moderate proximal right coronary artery stenosis that was unchanged.  On review of the films he had a severe stenosis and a very small distal side branch of the right coronary artery where the wire had been before and it was postulated that perhaps intermittent opening and closing of this artery could've caused his clinical syndrome.  He was started on Ranexa and will be continued on this and metoprolol.  He should not go to rehabilitation on Friday but may resume cardiac rehabilitation on Monday.  He was seen by inpatient rehabilitation.  The patient also had while he was admitted some mild penile bleeding while he was on heparin.  This improved but he had a mild drop in his hemoglobin.  In light of his anemia he  is advised to follow-up with his urologist as well as his gastroenterologist.  Of note the patient has had a 20-30 pound weight loss that will need to be investigated by his primary physician.  He will follow up with me in one week.  Discharge Exam: Blood pressure 107/63, pulse 71, temperature 97.6 F (36.4 C), temperature source  Oral, resp. rate 18, height 6' (1.829 m), weight 62.37 kg (137 lb 8 oz), SpO2 99 %. Weight: 62.37 kg (137 lb 8 oz) Radial cath site is clean and dry, lungs clear, no S3 Labs: CBC:   Lab Results  Component Value Date   WBC 4.5 06/24/2015   HGB 9.9* 06/24/2015   HCT 31.4* 06/24/2015   MCV 84.2 06/24/2015   PLT 184 06/24/2015   CMP:  Recent Labs Lab 06/22/15 1551 06/23/15 0356  NA 133* 136  K 4.2 4.6  CL 100* 101  CO2 24 29  BUN 18 17  CREATININE 1.06 1.09  CALCIUM 8.8* 8.7*  PROT 6.6  --   BILITOT 0.7  --   ALKPHOS 64  --   ALT 12*  --   AST 22  --   GLUCOSE 146* 103*   Lipid Panel     Component Value Date/Time   CHOL 155 06/18/2015 0234   TRIG 99 06/18/2015 0234   HDL 49 06/18/2015 0234   CHOLHDL 3.2 06/18/2015 0234   VLDL 20 06/18/2015 0234   LDLCALC 86 06/18/2015 0234   Cardiac Enzymes:  Recent Labs  06/22/15 2238 06/23/15 0356 06/23/15 0949  TROPONINI 0.24* 0.21* 0.16*   Thyroid: Lab Results  Component Value Date   TSH 2.42 07/04/2007     Radiology: Lungs clear, calcified hilar nodes  EKG: Normal  Discharge Medications:   Medication List    TAKE these medications        acetaminophen 500 MG tablet  Commonly known as:  TYLENOL  Take 500 mg by mouth daily as needed (pain).     aspirin EC 81 MG tablet  Take 81 mg by mouth daily.     buPROPion 100 MG 12 hr tablet  Commonly known as:  WELLBUTRIN SR  TAKE 1 TABLET TWICE DAILY.     FLUoxetine 10 MG capsule  Commonly known as:  PROZAC  Take 1 capsule (10 mg total) by mouth daily.     Magnesium 250 MG Tabs  Take 250 mg by mouth daily.     metoprolol succinate 50 MG 24 hr tablet  Commonly known as:  TOPROL-XL  Take 25 mg by mouth daily. Take with or immediately following a meal.     metoprolol succinate 25 MG 24 hr tablet  Commonly known as:  TOPROL-XL  Take 1/2 tablet at bedtime.     nitroGLYCERIN 0.4 MG SL tablet  Commonly known as:  NITROSTAT  Place 0.4 mg under the tongue  every 5 (five) minutes as needed for chest pain.     nitroGLYCERIN 0.4 MG SL tablet  Commonly known as:  NITROSTAT  Place 1 tablet (0.4 mg total) under the tongue every 5 (five) minutes x 3 doses as needed for chest pain.     omeprazole 20 MG capsule  Commonly known as:  PRILOSEC  Take 20 mg by mouth daily.     ranolazine 500 MG 12 hr tablet  Commonly known as:  RANEXA  Take 1 tablet (500 mg total) by mouth 2 (two) times daily.     simvastatin 40 MG tablet  Commonly known as:  ZOCOR  Take 40 mg by mouth every other day. Take at bedtime     ticagrelor 90 MG Tabs tablet  Commonly known as:  BRILINTA  Take 1 tablet (90 mg total) by mouth 2 (two) times daily.     VITAMIN B-12 PO  Take 1 tablet by mouth daily.       Followup plans and appointments: May resume cardiac rehabilitation next Monday.  Follow-up with Dr. Wynonia Lawman in one week.  Discharge instructions given regarding care of the radial cath site.  Time spent with patient to include physician time:  30 minutes Signed: W. Doristine Church. MD [facility] 06/24/2015, 9:20 AM

## 2015-06-24 NOTE — Progress Notes (Signed)
Pt discharged home. All instructions reviewed with patient, and IV removed. Pt understands all instructions, All questions answered. Etta Quill, RN

## 2015-06-24 NOTE — Discharge Instructions (Signed)
Radial Site Care °Refer to this sheet in the next few weeks. These instructions provide you with information on caring for yourself after your procedure. Your caregiver may also give you more specific instructions. Your treatment has been planned according to current medical practices, but problems sometimes occur. Call your caregiver if you have any problems or questions after your procedure. °HOME CARE INSTRUCTIONS °· You may shower the day after the procedure. Remove the bandage (dressing) and gently wash the site with plain soap and water. Gently pat the site dry. °· Do not apply powder or lotion to the site. °· Do not submerge the affected site in water for 3 to 5 days. °· Inspect the site at least twice daily. °· Do not flex or bend the affected arm for 24 hours. °· No lifting over 5 pounds (2.3 kg) for 5 days after your procedure. °· Do not drive home if you are discharged the same day of the procedure. Have someone else drive you. °· You may drive 24 hours after the procedure unless otherwise instructed by your caregiver. °· Do not operate machinery or power tools for 24 hours. °· A responsible adult should be with you for the first 24 hours after you arrive home. °What to expect: °· Any bruising will usually fade within 1 to 2 weeks. °· Blood that collects in the tissue (hematoma) may be painful to the touch. It should usually decrease in size and tenderness within 1 to 2 weeks. °SEEK IMMEDIATE MEDICAL CARE IF: °· You have unusual pain at the radial site. °· You have redness, warmth, swelling, or pain at the radial site. °· You have drainage (other than a small amount of blood on the dressing). °· You have chills. °· You have a fever or persistent symptoms for more than 72 hours. °· You have a fever and your symptoms suddenly get worse. °· Your arm becomes pale, cool, tingly, or numb. °· You have heavy bleeding from the site. Hold pressure on the site. °Document Released: 12/02/2010 Document Revised:  01/22/2012 Document Reviewed: 12/02/2010 °ExitCare® Patient Information ©2015 [employer]. This information is not intended to replace advice given to you by your health care provider. Make sure you discuss any questions you have with your health care provider. ° °

## 2015-06-24 NOTE — Progress Notes (Signed)
Prior to discharge pt complained of being dizzy, paged Dr.Tilley. MD is aware. Etta Quill, RN

## 2015-06-25 ENCOUNTER — Telehealth: Payer: Self-pay | Admitting: Family Medicine

## 2015-06-25 ENCOUNTER — Encounter (HOSPITAL_COMMUNITY): Payer: Medicare Other

## 2015-06-25 NOTE — Telephone Encounter (Signed)
Pt was called concerning recent hospitalization. Pt states when he was released yesterday that he felt off and not steady on his feet. He states he is feeling much better today. He states that none of his medications were changed but he was given a new one. He was prescribed Ranexa by Dr. Wynonia Lawman but has not picked it up yet. He states he has some concerns about that medication. Pt was informed to call Dr. Thurman Coyer office and share his concerns with them. He also states he just has some general questions about his hospital stay. I asked if pt had a follow up visit with Dr. Wynonia Lawman and he stated no. I then inquired if pt would like to come in and speak to [facility] about these issues. Pt stated he thought that was a good idea and I scheduled him for Monday. Pt was then asked if he had all his medications filled and any concerns about those. He stated that other than new medication all other meds were filled and he understood how to take them. I am forwarding this message to [facility] so he will be aware of what is going on with Mr. Orourke.

## 2015-06-28 ENCOUNTER — Ambulatory Visit (INDEPENDENT_AMBULATORY_CARE_PROVIDER_SITE_OTHER): Payer: Medicare Other | Admitting: Family Medicine

## 2015-06-28 ENCOUNTER — Encounter: Payer: Self-pay | Admitting: Family Medicine

## 2015-06-28 ENCOUNTER — Encounter (HOSPITAL_COMMUNITY)
Admission: RE | Admit: 2015-06-28 | Discharge: 2015-06-28 | Disposition: A | Discharge status: Home / Self Care | Payer: Medicare Other | Source: Ambulatory Visit | Attending: Cardiology | Admitting: Cardiology

## 2015-06-28 VITALS — BP 116/60 | HR 58 | Wt 149.6 lb

## 2015-06-28 DIAGNOSIS — R634 Abnormal weight loss: Secondary | ICD-10-CM | POA: Diagnosis not present

## 2015-06-28 DIAGNOSIS — K219 Gastro-esophageal reflux disease without esophagitis: Secondary | ICD-10-CM | POA: Diagnosis not present

## 2015-06-28 DIAGNOSIS — K627 Radiation proctitis: Secondary | ICD-10-CM | POA: Diagnosis not present

## 2015-06-28 DIAGNOSIS — G2581 Restless legs syndrome: Secondary | ICD-10-CM | POA: Diagnosis not present

## 2015-06-28 DIAGNOSIS — J3089 Other allergic rhinitis: Secondary | ICD-10-CM | POA: Diagnosis not present

## 2015-06-28 DIAGNOSIS — I209 Angina pectoris, unspecified: Secondary | ICD-10-CM

## 2015-06-28 DIAGNOSIS — I214 Non-ST elevation (NSTEMI) myocardial infarction: Secondary | ICD-10-CM

## 2015-06-28 MED ORDER — OMEPRAZOLE 20 MG PO CPDR: 20.0000 mg | DELAYED_RELEASE_CAPSULE | Freq: Every day | ORAL | Status: DC

## 2015-06-28 NOTE — Progress Notes (Signed)
   Subjective:    Patient ID: Corey Oneill, male    DOB: Apr 14, 1936, 79 y.o.   MRN: [id number]  HPI He is here after a recent hospitalization for evaluation and treatment of chest pain. This is his second visit to the hospital. The previous one was for stenting. He was placed on medication including Ranexa and has concerns over this interacting with statin. He has noted intermittent lightheadedness but has not checked his heart rate or rhythm to see if it's related to that. He has had some weight loss. He has continued difficulty with rectal bleeding. Does have a history of radiation proctitis. He was last seen by his gastroenterologist several months ago and given enemas which apparently steroid spray he started using them again. He also notes difficulty with a slightly hoarse voice since he stopped his Prilosec. He also complains of intermittent difficulty over the last 10 years with difficulty falling asleep due to restless legs. He has noted an increase in this over the last several days.He also complains of difficulty with nasal congestion but no sneezing, itchy watery eyes or rhinorrhea.  Review of Systems     Objective:   Physical Exam Alert and in no distress otherwise not examined       Assessment & Plan:  Gastroesophageal reflux disease without esophagitis - Plan: omeprazole (PRILOSEC) 20 MG capsule  NSTEMI (non-ST elevated myocardial infarction)  Ischemic chest pain  Radiation proctitis  RLS (restless legs syndrome)  Loss of weight  Other allergic rhinitis He will continue to be followed by cardiology. I will place him back on Prilosec to help with his voice. Discussed in detail his most recent visit in regards to chest pain and his present medication regimen I will discuss statin with his pharmacist. He will discuss the weight loss as well as continued difficulty with rectal bleeding with his gastroenterologist. Discussed treatment of his RLS. Explained that this would  potentially be another medication. He would like to hold off on this at the present time. Did recommend using Flonase for the nasal congestion and then possibly either Allegra or Claritin. Over half an hour spent discussing all these issues with him.

## 2015-06-28 NOTE — Patient Instructions (Signed)
Talk to Dr. Wynonia Lawman about the cost of the medicine. Call Dr. Earlean Shawl concerning the continued bleeding. Let me know about the restless legs.

## 2015-06-28 NOTE — Progress Notes (Signed)
Pt returned to cardiac rehab after two cardiac related hospitalizations.  Pt tolerated exercise with no complaints.  Pt tolerated workloads with no reoccurence of angina or shortness of breath.  Pt is to follow up with Dr. Wynonia Lawman this week.  Pt is to call his office this morning. Pt with great concern regarding determination of whether or not he had a small MI.  Pt reported to the  rehab staff that he was told by his inpatient nurse that he had a MI.  Pt was not aware or was told by his MD that he had a MI.  Advised pt to clarify this with Dr. Wynonia Lawman at his follow up. Pt also questioned the Ranexa that was ordered.  Pt has not started the medication because he read that it interferes with another medication he is taking. Advised pt to clarify this with Dr. Wynonia Lawman.  Pt given emotional support and encouragement.  Offered to call the office on pt behalf to set up an appt.  Pt declined. Maurice Small RN, BSN      30 day Psychosocial followup assessment  Patient psychosocial assessment reveals no barriers to cardiac rehab participation.  Psychosocial areas that are affecting patient's rehab experience include concerns about his general health and emotional issues as evidenced by anxiety and uneasiness.  Patient needs support to develop positive coping skills to deal with psychosocial concerns.  Patient does not feel making progress towards cardiac rehab goals because of his "set back" with his recent hospitalizations.  Patient reports health and activity level have stayed the same in the past 30 days.  Pt is hopeful with the new stent he will be able to see some improvement and progress toward meeting his goals.  Patient reports feeling hopeful about current and projected progress toward cardiac rehab goals.  Patient's rate of progress towards goals is good.  Plan of action to help patient continue to work towards rehab goals include monitor MET level progression, attend education classes and consistency with  home exercise.  Will continue to monitor and evaluate progress toward psychosocial goals.   Goals in progress:  improved management of anxiety improved coping skills and stress management Help patient work toward returning to meaningful activities that improve patient's quality of life and are attainable. Cherre Huger, BSN

## 2015-06-29 ENCOUNTER — Telehealth: Payer: Self-pay | Admitting: Family Medicine

## 2015-06-29 MED ORDER — ROSUVASTATIN CALCIUM 10 MG PO TABS: 10.0000 mg | ORAL_TABLET | Freq: Every day | ORAL | Status: DC

## 2015-06-29 NOTE — Telephone Encounter (Signed)
Pt called and was wanting to know if you talked to the pharmacist about the medications you 2  discussed on office visit 06-28-2015 pt going to see cardio Thursday, thanks, :):):):)

## 2015-06-29 NOTE — Telephone Encounter (Signed)
He presently is on Ranexa and Zocor. There is a contraindication to this. I checked with [facility] and we'll switch him to Crestor which will be cleaner.

## 2015-06-30 ENCOUNTER — Encounter (HOSPITAL_COMMUNITY)
Admission: RE | Admit: 2015-06-30 | Discharge: 2015-06-30 | Disposition: A | Discharge status: Home / Self Care | Payer: Medicare Other | Source: Ambulatory Visit | Attending: Cardiology | Admitting: Cardiology

## 2015-06-30 DIAGNOSIS — I209 Angina pectoris, unspecified: Secondary | ICD-10-CM | POA: Diagnosis not present

## 2015-07-01 ENCOUNTER — Encounter: Payer: Self-pay | Admitting: Cardiology

## 2015-07-01 DIAGNOSIS — R42 Dizziness and giddiness: Secondary | ICD-10-CM | POA: Diagnosis not present

## 2015-07-01 DIAGNOSIS — K219 Gastro-esophageal reflux disease without esophagitis: Secondary | ICD-10-CM | POA: Diagnosis not present

## 2015-07-01 DIAGNOSIS — R0602 Shortness of breath: Secondary | ICD-10-CM | POA: Diagnosis not present

## 2015-07-01 DIAGNOSIS — I359 Nonrheumatic aortic valve disorder, unspecified: Secondary | ICD-10-CM | POA: Diagnosis not present

## 2015-07-01 DIAGNOSIS — R079 Chest pain, unspecified: Secondary | ICD-10-CM | POA: Diagnosis not present

## 2015-07-01 DIAGNOSIS — I350 Nonrheumatic aortic (valve) stenosis: Secondary | ICD-10-CM | POA: Diagnosis not present

## 2015-07-01 DIAGNOSIS — E785 Hyperlipidemia, unspecified: Secondary | ICD-10-CM | POA: Diagnosis not present

## 2015-07-01 DIAGNOSIS — I200 Unstable angina: Secondary | ICD-10-CM | POA: Diagnosis not present

## 2015-07-01 DIAGNOSIS — I25118 Atherosclerotic heart disease of native coronary artery with other forms of angina pectoris: Secondary | ICD-10-CM | POA: Diagnosis not present

## 2015-07-02 ENCOUNTER — Encounter (HOSPITAL_COMMUNITY)
Admission: RE | Admit: 2015-07-02 | Discharge: 2015-07-02 | Disposition: A | Discharge status: Home / Self Care | Payer: Medicare Other | Source: Ambulatory Visit | Attending: Cardiology | Admitting: Cardiology

## 2015-07-02 DIAGNOSIS — I209 Angina pectoris, unspecified: Secondary | ICD-10-CM | POA: Diagnosis not present

## 2015-07-05 ENCOUNTER — Encounter (HOSPITAL_COMMUNITY)
Admission: RE | Admit: 2015-07-05 | Discharge: 2015-07-05 | Disposition: A | Discharge status: Home / Self Care | Payer: Medicare Other | Source: Ambulatory Visit | Attending: Cardiology | Admitting: Cardiology

## 2015-07-05 DIAGNOSIS — I209 Angina pectoris, unspecified: Secondary | ICD-10-CM | POA: Diagnosis not present

## 2015-07-05 NOTE — Progress Notes (Signed)
Nutrition Note Spoke with pt. Pt worried about continued wt loss. Pt discussed wt loss with PCP and reports talking to his cardiologist about it. Pt to discuss wt loss with his gastroenterologist. Pt weight appears to range from 146-154 lb. Pt wt is down 4.4 lb over the past month "without trying." Pt educated re: High Calorie, High Protein diet.  Vitals - 1 value per visit 06/28/2015 06/24/2015 06/22/2015 06/19/2015 06/17/2015  Weight (lb) 149.6 146.2  149.4    Vitals - 1 value per visit 06/03/2015 05/20/2015 04/28/2015 04/21/2015 04/02/2015  Weight (lb) 154 152.78 151 149 149   Vitals - 1 value per visit 03/22/2015 02/01/2015 12/23/2014 11/20/2014  Weight (lb) 153.4 153.8 152 159   Continue client-centered nutrition education by RD as part of interdisciplinary care.  Monitor and evaluate progress toward nutrition goal with team.  Derek Mound, M.Ed, RD, LDN, CDE 07/05/2015 8:34 AM

## 2015-07-06 NOTE — Addendum Note (Signed)
Addended by: [name] on: 07/06/2015 07:43 AM   Modules accepted: Level of Service

## 2015-07-07 ENCOUNTER — Encounter (HOSPITAL_COMMUNITY)
Admission: RE | Admit: 2015-07-07 | Discharge: 2015-07-07 | Disposition: A | Discharge status: Home / Self Care | Payer: Medicare Other | Source: Ambulatory Visit | Attending: Cardiology | Admitting: Cardiology

## 2015-07-07 DIAGNOSIS — I209 Angina pectoris, unspecified: Secondary | ICD-10-CM | POA: Diagnosis not present

## 2015-07-08 ENCOUNTER — Other Ambulatory Visit: Payer: Self-pay | Admitting: Dermatology

## 2015-07-08 ENCOUNTER — Telehealth: Payer: Self-pay | Admitting: Family Medicine

## 2015-07-08 ENCOUNTER — Other Ambulatory Visit: Payer: Self-pay

## 2015-07-08 DIAGNOSIS — D225 Melanocytic nevi of trunk: Secondary | ICD-10-CM | POA: Diagnosis not present

## 2015-07-08 DIAGNOSIS — I25709 Atherosclerosis of coronary artery bypass graft(s), unspecified, with unspecified angina pectoris: Secondary | ICD-10-CM

## 2015-07-08 DIAGNOSIS — I350 Nonrheumatic aortic (valve) stenosis: Secondary | ICD-10-CM

## 2015-07-08 DIAGNOSIS — L309 Dermatitis, unspecified: Secondary | ICD-10-CM | POA: Diagnosis not present

## 2015-07-08 DIAGNOSIS — D485 Neoplasm of uncertain behavior of skin: Secondary | ICD-10-CM | POA: Diagnosis not present

## 2015-07-08 NOTE — Telephone Encounter (Signed)
Pt came by  And states he gave dr Wynonia Lawman another try  and wants to get a referral to dr cooper cardio,

## 2015-07-08 NOTE — Telephone Encounter (Signed)
Pt informed and verbalized understanding

## 2015-07-08 NOTE — Telephone Encounter (Signed)
Have him get follow-up with Dr. Burt Knack

## 2015-07-09 ENCOUNTER — Encounter (HOSPITAL_COMMUNITY)
Admission: RE | Admit: 2015-07-09 | Discharge: 2015-07-09 | Disposition: A | Discharge status: Home / Self Care | Payer: Medicare Other | Source: Ambulatory Visit | Attending: Cardiology | Admitting: Cardiology

## 2015-07-09 DIAGNOSIS — I209 Angina pectoris, unspecified: Secondary | ICD-10-CM | POA: Diagnosis not present

## 2015-07-09 NOTE — Progress Notes (Signed)
Pt returns today for cardiac rehab. Pt reported to rehab staff during the break between 2nd and third station that he took a ntg x 1 on yesterday evening with relief.  Pt was walking his dog up an incline and developed 3/10 chest discomfort.  Pt was alone and there was nowhere to sit.  Pt stopped momentarily and continued back to his home.  Upon arrival back, pt sat down and took the ntg.  The chest discomfort immediately resolved.  Pt did not call his primary cardiologist - Wynonia Lawman.  Pt is in the process of switching cardiologist.  Referral sent by primary MD for Dr. Burt Knack for new patient.  Pt able to tolerate exercise with no complaints.  Pt feels his dizziness has gotten better since increasing his fluid intake. Cherre Huger, BSN

## 2015-07-12 ENCOUNTER — Encounter (HOSPITAL_COMMUNITY)
Admission: RE | Admit: 2015-07-12 | Discharge: 2015-07-12 | Disposition: A | Discharge status: Home / Self Care | Payer: Medicare Other | Source: Ambulatory Visit | Attending: Cardiology | Admitting: Cardiology

## 2015-07-12 DIAGNOSIS — I209 Angina pectoris, unspecified: Secondary | ICD-10-CM | POA: Diagnosis not present

## 2015-07-12 DIAGNOSIS — C61 Malignant neoplasm of prostate: Secondary | ICD-10-CM | POA: Diagnosis not present

## 2015-07-12 DIAGNOSIS — R310 Gross hematuria: Secondary | ICD-10-CM | POA: Diagnosis not present

## 2015-07-13 ENCOUNTER — Other Ambulatory Visit: Payer: Self-pay | Admitting: Dermatology

## 2015-07-13 DIAGNOSIS — L089 Local infection of the skin and subcutaneous tissue, unspecified: Secondary | ICD-10-CM | POA: Diagnosis not present

## 2015-07-13 DIAGNOSIS — D485 Neoplasm of uncertain behavior of skin: Secondary | ICD-10-CM | POA: Diagnosis not present

## 2015-07-14 ENCOUNTER — Encounter (HOSPITAL_COMMUNITY)
Admission: RE | Admit: 2015-07-14 | Discharge: 2015-07-14 | Disposition: A | Discharge status: Home / Self Care | Payer: Medicare Other | Source: Ambulatory Visit | Attending: Cardiology | Admitting: Cardiology

## 2015-07-14 DIAGNOSIS — I722 Aneurysm of renal artery: Secondary | ICD-10-CM | POA: Diagnosis not present

## 2015-07-14 DIAGNOSIS — K5730 Diverticulosis of large intestine without perforation or abscess without bleeding: Secondary | ICD-10-CM | POA: Diagnosis not present

## 2015-07-14 DIAGNOSIS — R310 Gross hematuria: Secondary | ICD-10-CM | POA: Diagnosis not present

## 2015-07-14 DIAGNOSIS — I209 Angina pectoris, unspecified: Secondary | ICD-10-CM | POA: Diagnosis not present

## 2015-07-15 ENCOUNTER — Telehealth: Payer: Self-pay | Admitting: Cardiovascular Disease

## 2015-07-15 NOTE — Telephone Encounter (Signed)
Per Dr Burt Knack antibiotics are NOT needed prior to dental appointment.  Dr Burt Knack has also discussed this pt with Dr Wynonia Lawman and we will take over Cardiology care at this time. The pt has a pending appointment 08/06/15.

## 2015-07-15 NOTE — Progress Notes (Signed)
Patient ID: Corey Oneill, male   DOB: 1936/06/23, 79 y.o.   MRN: [id number]   Corey Oneill, [place]    Date of visit:  07/01/2015 DOB:  10-12-1936    Age:  79 yrs. Medical record number:  [id number]     Account number:  [id number] Primary Care Provider: Jill Alexanders C ____________________________ CURRENT DIAGNOSES  1. Aortic valve stenosis  2. CAD native with angina  3. Hyperlipidemia  4. Gastro-esophageal reflux disease  5. Dizziness and giddiness ____________________________ ALLERGIES  No Known Allergies ____________________________ MEDICATIONS  1. Vitamin B-12 1,000 mcg tablet, 6000 mcg qd  2. magnesium 250 mg tablet, 1 p.o. daily  3. fluoxetine 10 mg capsule, 1 p.o. daily  4. omeprazole 20 mg tablet,delayed release, 1 p.o. daily  5. bupropion HCl 100 mg tablet, 1 p.o. daily  6. metoprolol succinate ER 50 mg tablet,extended release 24 hr, 1/2 tab daily  7. nitroglycerin 0.4 mg sublingual tablet, Take as directed  8. aspirin 81 mg chewable tablet, 1 p.o. daily  9. Brilinta 90 mg tablet, 1 p.o. daily  10. Crestor 10 mg tablet, 1 p.o. daily ____________________________ CHIEF COMPLAINTS  Followup of CAD native with angina ____________________________ HISTORY OF PRESENT ILLNESS  Patient seen for cardiac followup. He has had a significant course of events since he was last in the office. He was hospitalized for a catheterization done by Dr. Tamala Julian who eventually found him to have a stenosis in the distal right coronary artery that was felt to be significant by FFR and he had a stent placed in this area. He also had FFR done of the intermediate stenosis that was not felt to be significant enough for intervention. He was sent home and did well with improvement in his angina but then had a prolonged episode of chest discomfort he went back to the emergency room with. He had very mild elevations of troponin with no EKG changes. At that time it was unclear whether this was a non-STEMI or whether the  elevation was left over from the prior intervention a few days beforehand. He underwent a repeat catheterization that showed the stent site to be patent. He had occlusion of a very small side branch that had reopened in the distal right coronary artery that may have caused the problems but we really could not be sure of this. I wanted to try him on Ranexa but the pharmacy felt there was an interaction potentially with simvastatin. He has since been taken off of simvastatin by his primary doctor and placed on Crestor. He has not started Ranexa because he is concerned about the cost of it. He has been involved in rehabilitation and still complains of some dizziness. He still complains of intermittent trouble with blood in his stool and also has had a significant weight loss this year and has lost another 5 pounds since he was here. The dizziness is what he complains of mostly. In addition he does note improvement in his anginal symptoms when he walks the dog. ____________________________ PAST HISTORY  Past Medical Illnesses:  prostate cancer treated with external beam radiation 2014, depression, hyperlipidemia, GERD;  Cardiovascular Illnesses:  CAD, aortic stenosis;  Surgical Procedures:  hernia repair, tonsillectomy, nasal surgeries;  NYHA Classification:  I;  Canadian Angina Classification:  Class 0: Asymptomatic;  Cardiology Procedures-Invasive:  cardiac cath (left) June 2016, cardiac cath (left) August 2016, stent August 2016, relook cath August 2016;  Cardiology Procedures-Noninvasive:  treadmill, echocardiogram April 2016;  Cardiac Cath Results:  mild to moderate  AS, 70% intermediate stenosis, 40% RCA and Circ stenosis;  LVEF of 60% documented via cardiac cath on 06/21/2915,   ____________________________ CARDIO-PULMONARY TEST DATES EKG Date:  06/01/2015;   Cardiac Cath Date:  06/18/2015;  Stent Placement Date: 06/18/2015;  Echocardiography Date: 02/22/2015;  Chest Xray Date: 02/01/2015;    ____________________________ FAMILY HISTORY Father -- Father dead, Cancer Mother -- Mother dead, Malignant lymphoma Sister -- Sister alive with problem, Dementia/Alzheimers ____________________________ SOCIAL HISTORY Alcohol Use:  no alcohol use;  Smoking:  used to smoke but quit Prior to 1980;  Diet:  regular diet;  Lifestyle:  married;  Exercise:  walking;  Occupation:  Dealer;  Residence:  lives with wife;   ____________________________ REVIEW OF SYSTEMS General:  malaise and fatigue, weight loss of approximately 5 lbs Eyes: wears eye glasses/contact lenses Respiratory: denies dyspnea, cough, wheezing or hemoptysis. Cardiovascular:  please review HPI Abdominal: radiation proctitis with diarrhea and bleeding Genitourinary-Male: nocturia  Musculoskeletal:  denies arthritis, venous insufficiency, or muscle weakness Neurological:  restless legs Psychiatric:  insomnia  ____________________________ PHYSICAL EXAMINATION VITAL SIGNS  Blood Pressure:  104/60 Sitting, Left arm, regular cuff  , 106/62 Standing, Left arm and regular cuff   Pulse:  60/min. Weight:  147.00 lbs. Height:  72"BMI: 20  Constitutional:  pleasant white male in no acute distress Skin:  multiple sebaceous keratoses Head:  normocephalic, normal hair pattern, no masses or tenderness Neck:  supple, without massess. No JVD, thyromegaly or carotid bruits. Carotid upstroke normal. Chest:  normal symmetry, clear to auscultation. Cardiac:  regular rhythm, normal S1 and S2, no S3 or S4, grade 2/6 systolic murmur at aortic area radiating to neck Peripheral Pulses:  the femoral,dorsalis pedis, and posterior tibial pulses are full and equal bilaterally with no bruits auscultated. Extremities & Back:  cath site clean and dry Neurological:  no gross motor or sensory deficits noted, affect appropriate, oriented x3. ____________________________ MOST RECENT LIPID PANEL 06/18/15  CHOL TOTL 155 mg/dl, LDL  86 NM, HDL 49 mg/dl and TRIGLYCER 99 mg/dl ____________________________ IMPRESSIONS/PLAN  1. Coronary artery disease with recent stenting of the distal right coronary artery stenosis with improvement in exertional angina but with some continued angina I think 2. Dizziness of uncertain etiology with somewhat low blood pressure 3. Weight loss of 5 pounds with abnormal weight loss again occurring this year 4. Intermittent rectal bleeding  Recommendations:  Still has episodes of some dizziness. Extensive hospital records reviewed. I am not going to start Ranexa at this time since he is not having much angina and we are following him in rehabilitation. The weight loss is quite concerning. I recommended that he discuss this with his primary physician or his gastroenterologist. I will see him in followup in 3 months. Answered questions about his dizziness as well as recent troponin elevation of which the nurse told him he had a heart attack. ____________________________ TODAYS ORDERS  1. Return Visit: 3 months                       ____________________________ Cardiology Physician:  Kerry Hough MD [facility]

## 2015-07-15 NOTE — Telephone Encounter (Signed)
New Message  Pt calling to see if he can take antibiotic for teeth cleaning. Please call back and discuss.

## 2015-07-16 ENCOUNTER — Encounter (HOSPITAL_COMMUNITY): Admission: RE | Admit: 2015-07-16 | Payer: Medicare Other | Source: Ambulatory Visit

## 2015-07-21 ENCOUNTER — Telehealth (HOSPITAL_COMMUNITY): Payer: Self-pay | Admitting: *Deleted

## 2015-07-21 ENCOUNTER — Telehealth: Payer: Self-pay | Admitting: Physician Assistant

## 2015-07-21 ENCOUNTER — Encounter (HOSPITAL_COMMUNITY)
Admission: RE | Admit: 2015-07-21 | Discharge: 2015-07-21 | Disposition: A | Discharge status: Home / Self Care | Payer: Medicare Other | Source: Ambulatory Visit | Attending: Cardiology | Admitting: Cardiology

## 2015-07-21 ENCOUNTER — Ambulatory Visit (HOSPITAL_COMMUNITY)
Admission: RE | Admit: 2015-07-21 | Discharge: 2015-07-21 | Disposition: A | Discharge status: Home / Self Care | Payer: Medicare Other | Source: Ambulatory Visit | Attending: Family Medicine | Admitting: Family Medicine

## 2015-07-21 DIAGNOSIS — I209 Angina pectoris, unspecified: Secondary | ICD-10-CM | POA: Insufficient documentation

## 2015-07-21 NOTE — Telephone Encounter (Signed)
-----   Message from [name], MD sent at 07/21/2015 12:04 PM EDT ----- Regarding: RE: Need help with further intervention I've accepted him as a patient and will be following him from moving forward. I'm fine to have him continue rehab at a lower workload until he sees [place] or hold off until after the appointment, whichever you think is best.  thx ----- Message -----    From: Rowe Pavy, RN    Sent: 07/21/2015   8:30 AM      To: [name], MD Subject: Need help with further intervention            Dr. Burt Knack,  Reaching out to you regarding the above patient.  Pt is seeking to establish care with you (formerly with Dr. Wynonia Lawman)  Dissatisfied with his medical care.  Pt has appt on 9/23 with [facility].  Dr. Tamala Julian placed stent 06/2015.  I believe you may have cath him in the past?  Pt also has aortic stenosis and thought you may be a good fit for him in case he ever needed a Tavr.  Complicated medical history with residual disease and intolerance and cost prohibitive for many of the antianginal therapies due to hypotension.    During first station of exercise ambulating on the track at cardiac rehab.  Pt experienced chest discomfort that he rated 3/10.  Pt self administered NTG than continued to ambulate and reported this to the rehab staff when his blood pressure was checked.  Pre exercise bp 110/60.  Pt complaints of light headed which is common complaint for this patient throughout his participation in cardiac rehab. Exercise stopped.  12 lead ekg obtained.  Pt requested that this office be called for notification.  Pt preferred not to have Dr. Wynonia Lawman called with this mornings events.  Called and spoke to [place] on call for [facility].  Pt with complete relief of chest discomfort 0/10.  Pt bp 92/40.  Pt with history of "soft bp".  Pt given water and also gatorade.  Pt felt better.  Updated Danya with this mornings events.  No further orders received. .  Pt post bp 102/55,  pt remains symptom free and light headed feeling is "better".  Pt feels able to drive self home and will call rehab staff when he arrives home.    Please help me with this I am unsure of what to do next [place]

## 2015-07-21 NOTE — Telephone Encounter (Signed)
[name] from cardiac rehab called answering service with question. This is a patient of Dr. Thurman Oneill but he plans to establish care with our office - has an appointment with Corey Oneill in about 2 weeks and hopes to see Dr. Burt Oneill as his primary cardiologist.   Chart reviewed - patient with CAD s/p recent PCI with residual disease. Initially tried on Ranexa but unable to afford cost-wise. The patient ambulated this morning at cardiac rehab and on the 13th lap around, complained of 3/10 chest discomfort. He took a SL NTG with complete relief. [name] reports EKG was unremarkable. His BP has tended to run low lately, currently 93/50,  but he is completely asymptomatic. The patient feels fine now. He reports that on 2 other occasions he has had to take SL NTG.  BP will prohibit further med titration. Since he is not yet technically established with our group other than our interventionalists providing PCI in the past, I will review further advice with Dr. Burt Oneill. I told [name] to tell the patient not to take any more SL NTG for now given soft BP, and to report any further episodes of CP to our office.  Corey Montano PA-C

## 2015-07-21 NOTE — Progress Notes (Signed)
During first station of exercise ambulating on the track at cardiac rehab.  Pt experienced chest discomfort that he rated 3/10.  Pt self administered NTG than continued to ambulate and reported this to the rehab staff when his blood pressure was checked.  Pre exercise bp 110/60.  Pt complaints of light headed which is common complaint for this patient throughout his participation in cardiac rehab. Exercise stopped.  12 lead ekg obtained.  Pt is in the process of establishing care with Dr.Cooper.  Pt has upcoming appointment with Richardson Dopp on 9/23.  Pt requested that this office be called for notification.  Pt preferred not to have Dr. Wynonia Lawman called with this mornings events.  Called and spoke to [place] on call for [facility].  Pt with complete relief of chest discomfort 0/10.  Pt bp 92/40.  Pt with history of "soft bp".  Pt given water and also gatorade.  Pt felt better.  Updated Danya with this mornings events.  No further orders received.  Will contact dr. Burt Knack when the office opens for further instructions.  Advised pt not to take NTG due to soft/low bp.  Pt instructed to hold exercise until further recommendations can be given.  Pt post bp 102/55, pt remains symptom free and light headed feeling is "better".  Pt feels able to drive self home and will call rehab staff when he arrives home.  Cherre Huger, BSN

## 2015-07-22 ENCOUNTER — Telehealth (HOSPITAL_COMMUNITY): Payer: Self-pay | Admitting: *Deleted

## 2015-07-22 NOTE — Telephone Encounter (Signed)
Called and updated pt with Dr. Burt Knack recommendations regarding cardiac rehab.  Pt concerned regarding his upcoming appt not until 9/23.  Pt has some out of town work travel and was concerned leaving the area.  Pt asked if possible to be sent sooner.  Called and was able to secure cancelled appt with Truitt Merle on tomorrow 9/9 at 2:00.  Instructed pt to arrive at 1:45 and to to bring all his medications.  Will fax office notes to [facility] on behalf of this patient along with rehab report.  Cherre Huger, BSN

## 2015-07-23 ENCOUNTER — Ambulatory Visit (INDEPENDENT_AMBULATORY_CARE_PROVIDER_SITE_OTHER): Payer: Medicare Other | Admitting: Nurse Practitioner

## 2015-07-23 ENCOUNTER — Encounter: Payer: Self-pay | Admitting: Nurse Practitioner

## 2015-07-23 ENCOUNTER — Encounter (HOSPITAL_COMMUNITY): Payer: Medicare Other

## 2015-07-23 VITALS — BP 110/60 | HR 62 | Ht 72.0 in | Wt 149.4 lb

## 2015-07-23 DIAGNOSIS — D649 Anemia, unspecified: Secondary | ICD-10-CM | POA: Diagnosis not present

## 2015-07-23 DIAGNOSIS — I200 Unstable angina: Secondary | ICD-10-CM

## 2015-07-23 DIAGNOSIS — Z955 Presence of coronary angioplasty implant and graft: Secondary | ICD-10-CM | POA: Diagnosis not present

## 2015-07-23 DIAGNOSIS — R5383 Other fatigue: Secondary | ICD-10-CM | POA: Diagnosis not present

## 2015-07-23 DIAGNOSIS — R0602 Shortness of breath: Secondary | ICD-10-CM | POA: Diagnosis not present

## 2015-07-23 LAB — BRAIN NATRIURETIC PEPTIDE: Pro B Natriuretic peptide (BNP): 109 pg/mL — ABNORMAL HIGH (ref 0.0–100.0)

## 2015-07-23 LAB — CBC WITH DIFFERENTIAL/PLATELET
Basophils Absolute: 0 10*3/uL (ref 0.0–0.1)
Basophils Relative: 0.6 % (ref 0.0–3.0)
Eosinophils Absolute: 0.2 10*3/uL (ref 0.0–0.7)
Eosinophils Relative: 2.4 % (ref 0.0–5.0)
HCT: 28.4 % — ABNORMAL LOW (ref 39.0–52.0)
Hemoglobin: 9.1 g/dL — ABNORMAL LOW (ref 13.0–17.0)
Lymphocytes Relative: 14.5 % (ref 12.0–46.0)
Lymphs Abs: 1 10*3/uL (ref 0.7–4.0)
MCHC: 32.1 g/dL (ref 30.0–36.0)
MCV: 80.4 fl (ref 78.0–100.0)
Monocytes Absolute: 0.7 10*3/uL (ref 0.1–1.0)
Monocytes Relative: 10.3 % (ref 3.0–12.0)
Neutro Abs: 5 10*3/uL (ref 1.4–7.7)
Neutrophils Relative %: 72.2 % (ref 43.0–77.0)
Platelets: 236 10*3/uL (ref 150.0–400.0)
RBC: 3.53 Mil/uL — ABNORMAL LOW (ref 4.22–5.81)
RDW: 15.8 % — ABNORMAL HIGH (ref 11.5–15.5)
WBC: 7 10*3/uL (ref 4.0–10.5)

## 2015-07-23 LAB — BASIC METABOLIC PANEL
BUN: 17 mg/dL (ref 6–23)
CO2: 27 mEq/L (ref 19–32)
Calcium: 8.9 mg/dL (ref 8.4–10.5)
Chloride: 100 mEq/L (ref 96–112)
Creatinine, Ser: 0.91 mg/dL (ref 0.40–1.50)
GFR: 85.74 mL/min (ref >60.00)
Glucose, Bld: 84 mg/dL (ref 70–99)
Potassium: 4 mEq/L (ref 3.5–5.1)
Sodium: 135 mEq/L (ref 135–145)

## 2015-07-23 LAB — TSH: TSH: 5.87 u[IU]/mL — ABNORMAL HIGH (ref 0.35–4.50)

## 2015-07-23 NOTE — Progress Notes (Signed)
CARDIOLOGY OFFICE NOTE  Date:  07/23/2015    Corey Oneill Date of Birth: 1936-05-11 Medical Record #[id number]  PCP:  [name], MD  Cardiologist:  Burt Knack - formerly Dr. Wynonia Lawman    Chief Complaint  Patient presents with  . Coronary Artery Disease    Follow up visit - seen for Dr. Burt Knack  . Aortic Stenosis    History of Present Illness: Corey Oneill is a 79 y.o. male who presents today for a follow up visit. Seen for Dr. Burt Knack. Former patient of Dr. Thurman Coyer.   He has CAD, AS, prostate cancer, HLD and GERD.  Most recently he has had a several month history of exertional angina. He has failed on medical management.  Recently admitted with progressive symptoms and underwent cardiac catheterization & was found to have a significant stenosis in the distal right coronary artery with an abnormal FFR. He had moderate stenosis in the intermediate branch but the FFR was not severe enough for PCI. He had placement of a 3.0 x 15 mm resolute stent to the distal right coronary artery by Dr. Tamala Julian postdilated to 3.5 mm. He was discharged on BRILINTA and aspirin.   After discharge he initially did ok but then had recurrence chest pain and was advised to go to the emergency room. At time of admission he was largely pain-free and EKG is unremarkable although troponin is 0.18. The patient had elevation of troponin consistent with a non-STEMI. EKG remained unremarkable. Because of his his recent stent implantation in elevations of troponin and chest pain at rest repeat catheterization was advised. This was performed again on 810 by Dr. Tamala Julian. The stent site was patent in the distal right coronary artery. The LAD had mild disease in the intermediate branch had moderate stenosis unchanged angiographically as well as mild to moderate proximal right coronary artery stenosis that was unchanged. On review of the films he had a severe stenosis and a very small distal side branch of the  right coronary artery where the wire had been before and it was postulated that perhaps intermittent opening and closing of this artery could've caused his clinical syndrome.  He was started on Ranexa during his admission. He had some mild penile bleeding while he was on heparin. This improved but he had a mild drop in his hemoglobin. He was advised to follow-up with his urologist as well as his gastroenterologist. He was also to follow up with PCP due to profound weight loss.   Comes back today. Here alone. Has lots of questions/concerns. Never started the Ranexa. This was removed off his list today. He is mostly concerned about persistent lightheadedness that he has. No palpitations. He will feel lightheaded if he stands up and even if he has been standing for a prolonged period of time. 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 He saw GU last week - had a CT - ?thickening of his bladder. He has had some shortness of breath - sounds like more with exertion (i.e, going up steps). One spell of chest tightness earlier this week - this happened while at rehab - he took a NTG and then reported his symptoms. Has not recurred and he has not been back since. Exercise is reportedly limited by his BP. Also concerned about his weight loss - says that over the past several years he has lost probably 20# and does not feel this has been worked up sufficiently. He is  67ft tall. He has not had frank syncope. No CHF symptoms.   Past Medical History  Diagnosis Date  . Hyperlipidemia   . Esophageal reflux   . Prostate cancer   . Hx of radiation therapy 04/14/13-06/09/13    prostate 7800 cGy, 40 sessions, seminal vesicles 5600 cGy 40 sessions  . CAD (coronary artery disease)     a.  LHC 8/16: Mid to distal LAD 30%, OM1 40%, proximal mid RCA 40%, distal RCA 60% >> FFR 0.69  >> PCI: 3 x 15 mm Resolute DES  . Aortic stenosis     a. peak to peak gradient by LHC 8/16:  37  mmHg (moderately severe)     Past Surgical History  Procedure Laterality Date  . Nose surgery    . Tonsillectomy    . Needle biopsy of prostate    . Colonoscopy    . Prostate surgery    . Cardiac catheterization N/A 04/21/2015    Procedure: Right/Left Heart Cath and Coronary Angiography;  Surgeon: [name], MD;  Location: [facility];  Service: Cardiovascular;  Laterality: N/A;  . Cardiac catheterization N/A 06/18/2015    Procedure: Intravascular Pressure Wire/FFR Study;  Surgeon: [name], MD;  Location: [facility];  Service: Cardiovascular;  Laterality: N/A;  . Cardiac catheterization N/A 06/18/2015    Procedure: Coronary Stent Intervention;  Surgeon: [name], MD;  Location: [facility];  Service: Cardiovascular;  Laterality: N/A;  . Cardiac catheterization N/A 06/18/2015    Procedure: Right/Left Heart Cath and Coronary Angiography;  Surgeon: [name], MD;  Location: [facility];  Service: Cardiovascular;  Laterality: N/A;  . Cardiac catheterization N/A 06/23/2015    Procedure: Left Heart Cath and Cors/Grafts Angiography;  Surgeon: [name], MD;  Location: [facility];  Service: Cardiovascular;  Laterality: N/A;     Medications: Current Outpatient Prescriptions  Medication Sig Dispense Refill  . acetaminophen (TYLENOL) 500 MG tablet Take 500 mg by mouth daily as needed (pain).    [name] aspirin EC 81 MG tablet Take 81 mg by mouth daily.    [name] buPROPion (WELLBUTRIN SR) 100 MG 12 hr tablet TAKE 1 TABLET TWICE DAILY. 60 tablet 5  . Cyanocobalamin (VITAMIN B-12 PO) Take 1 tablet by mouth daily.    [name] FLUoxetine (PROZAC) 10 MG capsule Take 1 capsule (10 mg total) by mouth daily. 90 capsule 3  . Magnesium 250 MG TABS Take 250 mg by mouth daily.     . nitroGLYCERIN (NITROSTAT) 0.4 MG SL tablet Place 0.4 mg under the tongue every 5 (five) minutes as needed for chest pain.    [name] omeprazole (PRILOSEC) 20 MG capsule Take 1 capsule (20 mg total) by  mouth daily. 90 capsule 3  . rosuvastatin (CRESTOR) 10 MG tablet Take 1 tablet (10 mg total) by mouth daily. 90 tablet 3  . ticagrelor (BRILINTA) 90 MG TABS tablet Take 1 tablet (90 mg total) by mouth 2 (two) times daily. 60 tablet 11   No current facility-administered medications for this visit.    Allergies: No Known Allergies  Social History: The patient  reports that he quit smoking about 47 years ago. He has never used smokeless tobacco. He reports that he drinks about 7.5 oz of alcohol per week. He reports that he does not use illicit drugs.   Family History: The patient's family history includes Brain cancer in his father; Depression in his daughter; Lymphoma in his mother.  Review of Systems: Please see the history of present illness.   Otherwise, the review of systems is positive for none.   All other systems are reviewed and negative.   Physical Exam: VS:  BP 110/60 mmHg  Pulse 62  Ht 6' (1.829 m)  Wt 149 lb 6.4 oz (67.767 kg)  BMI 20.26 kg/m2  SpO2 100% .  BMI Body mass index is 20.26 kg/(m^2).  Wt Readings from Last 3 Encounters:  07/23/15 149 lb 6.4 oz (67.767 kg)  06/28/15 149 lb 9.6 oz (67.858 kg)  06/24/15 146 lb 3.2 oz (66.316 kg)    General: Pleasant. He is quite thin. He is in no acute distress. Color is sallow.  HEENT: Normal. Neck: Supple, no JVD, carotid bruits, or masses noted.  Cardiac: Regular rate and rhythm. Heart tones distant. Outflow murmur. No edema.  Respiratory:  Lungs are clear to auscultation bilaterally with normal work of breathing.  GI: Soft and nontender.  MS: No deformity or atrophy. Gait and ROM intact. Skin: Warm and dry. Color is sallow Neuro:  Strength and sensation are intact and no gross focal deficits noted.  Psych: Alert, appropriate and with normal affect.   LABORATORY DATA:  EKG:  EKG is not ordered today.   Lab Results  Component Value Date   WBC 4.5 06/24/2015   HGB 9.9* 06/24/2015   HCT 31.4* 06/24/2015   PLT  184 06/24/2015   GLUCOSE 103* 06/23/2015   CHOL 155 06/18/2015   TRIG 99 06/18/2015   HDL 49 06/18/2015   LDLCALC 86 06/18/2015   ALT 12* 06/22/2015   AST 22 06/22/2015   NA 136 06/23/2015   K 4.6 06/23/2015   CL 101 06/23/2015   CREATININE 1.09 06/23/2015   BUN 17 06/23/2015   CO2 29 06/23/2015   TSH 2.42 07/04/2007   PSA 5.08* 12/02/2012   PSA 4.97* 12/02/2012   INR 1.24 06/17/2015    BNP (last 3 results) No results for input(s): BNP in the last 8760 hours.  ProBNP (last 3 results) No results for input(s): PROBNP in the last 8760 hours.   Other Studies Reviewed Today: Cardiac Cath Conclusion from 06/23/2015    1. Widely patent distal right coronary stent. 2. Diffuse proximal to mid RCA disease with up to 50% narrowing, unchanged from the prior study. 3. 40-50% mid first obtuse marginal unchanged from prior study. Widely patent LAD with diffuse plaquing up to 30-40%. 4. No new source for angina is identified. The current episode of chest pain if cardiac could've been related to coronary spasm. No evidence of thrombus is noted. 5. Left ventriculography was not performed     Cardiac Cath Conclusion from 06/18/2015    6. 1st Mrg lesion, 40% stenosed. FFR was 0.83 7. Prox RCA to Mid RCA lesion, 40% stenosed. 8. Mid LAD to Dist LAD lesion, 30% stenosed. 9. Dist RCA lesion, 60% stenosed but with FFR .69. There is a 0% residual stenosis post intervention. 10. A drug-eluting stent was placed. 11. Moderately severe aortic stenosis with a valve area calculating 0.81 - 1.2 cm depending upon the cardiac output used.   Moderately severe aortic stenosis, unchanged since previous catheterization 2-1/2 months ago.  Crescendo angina felt secondary to progression of right coronary disease.  Angiographic 60% distal stenosis in the right coronary documented to be hemodynamically significant by FFR 0.69.  Successful drug-eluting stent placement in the distal right coronary reducing  the 60% stenosis to 0% with TIMI grade 3 flow. Vessel occlusion during  the procedure reproduced the patient's exertional angina.  FFR evaluation of obtuse marginal #1 revealed no evidence of hemodynamic significance with a value 0.83.  Normal left ventricular function   Recommendations:   Check renal function and overall clinical status in a.m. and if stable the patient will be eligible for discharge.  Dual antiplatelets therapy for at least 6 months. We have chosen aspirin and Brilinta.   Echo Study Conclusions from 06/2015  - Left ventricle: The cavity size was normal. Wall thickness was increased in a pattern of mild LVH. Systolic function was normal. The estimated ejection fraction was in the range of 55% to 60%. Wall motion was normal; there were no regional wall motion abnormalities. Features are consistent with a pseudonormal left ventricular filling pattern, with concomitant abnormal relaxation and increased filling pressure (grade 2 diastolic dysfunction). - Aortic valve: Cusp separation was reduced. Valve mobility was restricted. Transvalvular velocity was increased. There was severe stenosis. Valve area (VTI): 0.88 cm^2. Valve area (Vmax): 0.77 cm^2. Valve area (Vmean): 0.77 cm^2. - Mitral valve: Moderately calcified annulus. - Left atrium: The atrium was mildly dilated.   Assessment/Plan: 1. CAD with recent PCI to the distal RCA - has residual disease that will be managed medically - readmitted with chest pain and had +troponin consistent with NSTEMI but repeat cath was stable - would favor continued medical management. Has had one spell of chest pain. Relieved with NTG. Would hold on adding Imdur with his AS.   2. Moderately severe AS - will need to be followed - no cardinal symptoms at this time. EF is normal.   3. Recent PCI - committed to DAPT - this will more than likely complicate his rectal bleeding/proctitis issue from prior radiation.   4.  Weight loss - not clear to me what has been done.   5. Progressive anemia - looks like this has been progressive over the past 6 months. He is on DAPT due to his DES stent.   6. HLD - on statin   7. Dyspnea - exertional - may be multifactorial - will recheck his labs. May have to switch Brilinta to Plavix. I do not get the sense that this is bothering him as much as the lightheadedness.   8. Lightheaded - stopping metoprolol - hopefully his BP will come up. Just given his stature/weight I am not surprised that he has positional symptoms.   Current medicines are reviewed with the patient today.  The patient does not have concerns regarding medicines other than what has been noted above.  The following changes have been made:  See above.  Labs/ tests ordered today include:    Orders Placed This Encounter  Procedures  . Brain natriuretic peptide  . Basic metabolic panel  . CBC with Differential/Platelet  . TSH     Disposition:   FU with me and Dr. Burt Knack in 3 weeks.    Patient is agreeable to this plan and will call if any problems develop in the interim.   Signed: Burtis Junes, RN, ANP-C 07/23/2015 2:34 PM  [facility] [address] [place], [place]  [address] Phone: [phone] Fax: [phone]

## 2015-07-23 NOTE — Patient Instructions (Addendum)
We will be checking the following labs today - BNP, TSH, CBC and BMET  Medication Instructions:    Continue with your current medicines. But   I am stopping the Metoprolol    Testing/Procedures To Be Arranged:  N/A  Follow-Up:   See me in 2 weeks - try to put on a day that Dr. Burt Knack is here.    Other Special Instructions:   N/A  Call the [facility] office at [phone] if you have any questions, problems or concerns.

## 2015-07-26 ENCOUNTER — Encounter (HOSPITAL_COMMUNITY): Payer: Medicare Other

## 2015-07-26 ENCOUNTER — Other Ambulatory Visit: Payer: Self-pay | Admitting: *Deleted

## 2015-07-26 MED ORDER — CLOPIDOGREL BISULFATE 75 MG PO TABS: 75.0000 mg | ORAL_TABLET | Freq: Every day | ORAL | Status: DC

## 2015-07-26 MED ORDER — FERROUS FUMARATE 325 (106 FE) MG PO TABS: 1.0000 | ORAL_TABLET | Freq: Every day | ORAL | Status: DC

## 2015-07-28 ENCOUNTER — Encounter (HOSPITAL_COMMUNITY): Payer: Medicare Other

## 2015-07-30 ENCOUNTER — Encounter (HOSPITAL_COMMUNITY): Payer: Medicare Other

## 2015-08-02 ENCOUNTER — Encounter (HOSPITAL_COMMUNITY): Payer: Medicare Other

## 2015-08-04 ENCOUNTER — Encounter (HOSPITAL_COMMUNITY): Payer: Medicare Other

## 2015-08-06 ENCOUNTER — Encounter (HOSPITAL_COMMUNITY): Payer: Medicare Other

## 2015-08-06 ENCOUNTER — Ambulatory Visit: Payer: Medicare Other | Admitting: Physician Assistant

## 2015-08-09 ENCOUNTER — Encounter (HOSPITAL_COMMUNITY)
Admission: RE | Admit: 2015-08-09 | Discharge: 2015-08-09 | Disposition: A | Discharge status: Home / Self Care | Payer: Medicare Other | Source: Ambulatory Visit | Attending: Cardiology | Admitting: Cardiology

## 2015-08-09 DIAGNOSIS — I209 Angina pectoris, unspecified: Secondary | ICD-10-CM | POA: Diagnosis not present

## 2015-08-09 NOTE — Progress Notes (Signed)
Pt returned to exercise today.  Pt absent last couple of weeks due to out of town work.  Pt has appt with Dr. Burt Knack on Wednesday morning.  Will plan to give him a rehab report to take with him. Cherre Huger, BSN

## 2015-08-11 ENCOUNTER — Encounter: Payer: Self-pay | Admitting: Nurse Practitioner

## 2015-08-11 ENCOUNTER — Ambulatory Visit (INDEPENDENT_AMBULATORY_CARE_PROVIDER_SITE_OTHER): Payer: Medicare Other | Admitting: Nurse Practitioner

## 2015-08-11 ENCOUNTER — Encounter (HOSPITAL_COMMUNITY)
Admission: RE | Admit: 2015-08-11 | Discharge: 2015-08-11 | Disposition: A | Discharge status: Home / Self Care | Payer: Medicare Other | Source: Ambulatory Visit | Attending: Cardiology | Admitting: Cardiology

## 2015-08-11 VITALS — BP 138/72 | HR 88 | Ht 72.0 in | Wt 149.1 lb

## 2015-08-11 DIAGNOSIS — R42 Dizziness and giddiness: Secondary | ICD-10-CM

## 2015-08-11 DIAGNOSIS — I209 Angina pectoris, unspecified: Secondary | ICD-10-CM | POA: Diagnosis not present

## 2015-08-11 DIAGNOSIS — Z955 Presence of coronary angioplasty implant and graft: Secondary | ICD-10-CM | POA: Diagnosis not present

## 2015-08-11 DIAGNOSIS — I200 Unstable angina: Secondary | ICD-10-CM

## 2015-08-11 DIAGNOSIS — I350 Nonrheumatic aortic (valve) stenosis: Secondary | ICD-10-CM

## 2015-08-11 NOTE — Progress Notes (Signed)
CARDIOLOGY OFFICE NOTE  Date:  08/11/2015    Corey Oneill Date of Birth: 1936-04-16 Medical Record #[id number]  PCP:  [name], MD  Cardiologist:  Burt Knack    Chief Complaint  Patient presents with  . Coronary Artery Disease    Follow up visit - seen for Dr. Burt Knack  . Aortic Stenosis    History of Present Illness: Corey Oneill is a 79 y.o. male who presents today for a follow up visit. Seen for Dr. Burt Knack. Former patient of Dr. Thurman Coyer.   He has CAD, AS, prostate cancer, HLD and GERD.  Most recently he has had a several month history of exertional angina. He has failed on medical management. Recently admitted with progressive symptoms and underwent cardiac catheterization & was found to have a significant stenosis in the distal right coronary artery with an abnormal FFR. He had moderate stenosis in the intermediate branch but the FFR was not severe enough for PCI. He had placement of a 3.0 x 15 mm resolute stent to the distal right coronary artery by Dr. Tamala Julian postdilated to 3.5 mm. He was discharged on BRILINTA and aspirin.   After discharge he initially did ok but then had recurrence chest pain and was advised to go to the emergency room. At time of admission he was largely pain-free and EKG is unremarkable although troponin is 0.18. The patient had elevation of troponin consistent with a non-STEMI. EKG remained unremarkable. Because of his his recent stent implantation in elevations of troponin and chest pain at rest repeat catheterization was advised. This was performed again on 810 by Dr. Tamala Julian. The stent site was patent in the distal right coronary artery. The LAD had mild disease in the intermediate branch had moderate stenosis unchanged angiographically as well as mild to moderate proximal right coronary artery stenosis that was unchanged. On review of the films he had a severe stenosis and a very small distal side branch of the right coronary artery  where the wire had been before and it was postulated that perhaps intermittent opening and closing of this artery could've caused his clinical syndrome.  He was started on Ranexa during his last admission. He had some mild penile bleeding while he was on heparin. This improved but he had a mild drop in his hemoglobin. He was advised to follow-up with his urologist as well as his gastroenterologist. He was also to follow up with PCP due to profound weight loss.   I saw him earlier this month - he was transferring his care to Dr. Burt Knack or to Dr. Tamala Julian at Dr. Thurman Coyer suggestion. Had lots of questions/concerns. Never started Ranexa. Lightheaded - that was his biggest issue. Has had rectal bleeding due to prior radiation for prostate CA. Had seen GU and had a CT - ?thickening of his bladder. Not clear as to what would further need to be done. Had had some shortness of breath - sounds like more with exertion (i.e, going up steps). One spell of chest tightness that he took NTG for. Also concerned about his weight loss - says that over the past several years he has lost probably 20# and did not feel this has been worked up sufficiently. He is 26ft tall. No frank syncope. No CHF symptoms. I stopped his Metoprolol. I ended up changing his Brilinta to Plavix.   Comes back today. Here alone. He is better. Probably about 75% better. Does use some NTG on occasion - may have used 4 or  5 times since here last. 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 NTG gives prompt relief. His lightheadedness is now resolved. No syncope. Still very concerned about his weight.        Past Medical History  Diagnosis Date  . Hyperlipidemia   . Esophageal reflux   . Prostate cancer   . Hx of radiation therapy 04/14/13-06/09/13    prostate 7800 cGy, 40 sessions, seminal vesicles 5600 cGy 40 sessions  . CAD (coronary artery disease)     a.  LHC 8/16: Mid to  distal LAD 30%, OM1 40%, proximal mid RCA 40%, distal RCA 60% >> FFR 0.69  >> PCI: 3 x 15 mm Resolute DES  . Aortic stenosis     a. peak to peak gradient by LHC 8/16:  37 mmHg (moderately severe)     Past Surgical History  Procedure Laterality Date  . Nose surgery    . Tonsillectomy    . Needle biopsy of prostate    . Colonoscopy    . Prostate surgery    . Cardiac catheterization N/A 04/21/2015    Procedure: Right/Left Heart Cath and Coronary Angiography;  Surgeon: [name], MD;  Location: [place] [facility];  Service: Cardiovascular;  Laterality: N/A;  . Cardiac catheterization N/A 06/18/2015    Procedure: Intravascular Pressure Wire/FFR Study;  Surgeon: [name], MD;  Location: [place] [facility];  Service: Cardiovascular;  Laterality: N/A;  . Cardiac catheterization N/A 06/18/2015    Procedure: Coronary Stent Intervention;  Surgeon: [name], MD;  Location: [facility];  Service: Cardiovascular;  Laterality: N/A;  . Cardiac catheterization N/A 06/18/2015    Procedure: Right/Left Heart Cath and Coronary Angiography;  Surgeon: [name], MD;  Location: [place] [facility];  Service: Cardiovascular;  Laterality: N/A;  . Cardiac catheterization N/A 06/23/2015    Procedure: Left Heart Cath and Cors/Grafts Angiography;  Surgeon: [name], MD;  Location: [place] [facility];  Service: Cardiovascular;  Laterality: N/A;     Medications: Current Outpatient Prescriptions  Medication Sig Dispense Refill  . acetaminophen (TYLENOL) 500 MG tablet Take 500 mg by mouth daily as needed (pain).    [name] aspirin EC 81 MG tablet Take 81 mg by mouth daily.    [name] buPROPion (WELLBUTRIN SR) 100 MG 12 hr tablet TAKE 1 TABLET TWICE DAILY. 60 tablet 5  . clopidogrel (PLAVIX) 75 MG tablet Take 1 tablet (75 mg total) by mouth daily. 30 tablet 9  . Cyanocobalamin (VITAMIN B-12 PO) Take 1 tablet by mouth daily.    . ferrous fumarate (HEMOCYTE - 106 MG FE) 325 (106 FE) MG TABS tablet Take 1 tablet  (106 mg of iron total) by mouth daily. 30 each 0  . FLUoxetine (PROZAC) 10 MG capsule Take 1 capsule (10 mg total) by mouth daily. 90 capsule 3  . Magnesium 250 MG TABS Take 250 mg by mouth daily.     . nitroGLYCERIN (NITROSTAT) 0.4 MG SL tablet Place 0.4 mg under the tongue every 5 (five) minutes as needed for chest pain.    [name] omeprazole (PRILOSEC) 20 MG capsule Take 1 capsule (20 mg total) by mouth daily. 90 capsule 3  . rosuvastatin (CRESTOR) 10 MG tablet Take 1 tablet (10 mg total) by mouth daily. 90 tablet 3   No current facility-administered medications for this visit.    Allergies: No Known Allergies  Social  History: The patient  reports that he quit smoking about 47 years ago. He has never used smokeless tobacco. He reports that he drinks about 7.5 oz of alcohol per week. He reports that he does not use illicit drugs.   Family History: The patient's family history includes Brain cancer in his father; Depression in his daughter; Lymphoma in his mother.   Review of Systems: Please see the history of present illness.   Otherwise, the review of systems is positive for none.   All other systems are reviewed and negative.   Physical Exam: VS:  BP 138/72 mmHg  Pulse 88  Ht 6' (1.829 m)  Wt 149 lb 1.9 oz (67.64 kg)  BMI 20.22 kg/m2  SpO2 97% .  BMI Body mass index is 20.22 kg/(m^2).  Wt Readings from Last 3 Encounters:  08/11/15 149 lb 1.9 oz (67.64 kg)  07/23/15 149 lb 6.4 oz (67.767 kg)  06/28/15 149 lb 9.6 oz (67.858 kg)    General: Pleasant. He is thin but in no acute distress.  HEENT: Normal. Neck: Supple, no JVD, carotid bruits, or masses noted.  Cardiac: Regular rate and rhythm. Outflow murmur noted. No edema.  Respiratory:  Lungs are clear to auscultation bilaterally with normal work of breathing.  GI: Soft and nontender.  MS: No deformity or atrophy. Gait and ROM intact. Skin: Warm and dry. Color is normal.  Neuro:  Strength and sensation are intact and no gross  focal deficits noted.  Psych: Alert, appropriate and with normal affect.   LABORATORY DATA:  EKG:  EKG is not ordered today.   Lab Results  Component Value Date   WBC 7.0 07/23/2015   HGB 9.1* 07/23/2015   HCT 28.4* 07/23/2015   PLT 236.0 07/23/2015   GLUCOSE 84 07/23/2015   CHOL 155 06/18/2015   TRIG 99 06/18/2015   HDL 49 06/18/2015   LDLCALC 86 06/18/2015   ALT 12* 06/22/2015   AST 22 06/22/2015   NA 135 07/23/2015   K 4.0 07/23/2015   CL 100 07/23/2015   CREATININE 0.91 07/23/2015   BUN 17 07/23/2015   CO2 27 07/23/2015   TSH 5.87* 07/23/2015   PSA 5.08* 12/02/2012   PSA 4.97* 12/02/2012   INR 1.24 06/17/2015    BNP (last 3 results) No results for input(s): BNP in the last 8760 hours.  ProBNP (last 3 results)  Recent Labs  07/23/15 1453  PROBNP 109.0*     Other Studies Reviewed Today:  Cardiac Cath Conclusion from 06/23/2015    1. Widely patent distal right coronary stent. 2. Diffuse proximal to mid RCA disease with up to 50% narrowing, unchanged from the prior study. 3. 40-50% mid first obtuse marginal unchanged from prior study. Widely patent LAD with diffuse plaquing up to 30-40%. 4. No new source for angina is identified. The current episode of chest pain if cardiac could've been related to coronary spasm. No evidence of thrombus is noted. 5. Left ventriculography was not performed     Cardiac Cath Conclusion from 06/18/2015    6. 1st Mrg lesion, 40% stenosed. FFR was 0.83 7. Prox RCA to Mid RCA lesion, 40% stenosed. 8. Mid LAD to Dist LAD lesion, 30% stenosed. 9. Dist RCA lesion, 60% stenosed but with FFR .69. There is a 0% residual stenosis post intervention. 10. A drug-eluting stent was placed. 11. Moderately severe aortic stenosis with a valve area calculating 0.81 - 1.2 cm depending upon the cardiac output used.   Moderately severe aortic stenosis,  unchanged since previous catheterization 2-1/2 months ago.  Crescendo angina felt  secondary to progression of right coronary disease.  Angiographic 60% distal stenosis in the right coronary documented to be hemodynamically significant by FFR 0.69.  Successful drug-eluting stent placement in the distal right coronary reducing the 60% stenosis to 0% with TIMI grade 3 flow. Vessel occlusion during the procedure reproduced the patient's exertional angina.  FFR evaluation of obtuse marginal #1 revealed no evidence of hemodynamic significance with a value 0.83.  Normal left ventricular function   Recommendations:   Check renal function and overall clinical status in a.m. and if stable the patient will be eligible for discharge.  Dual antiplatelets therapy for at least 6 months. We have chosen aspirin and Brilinta.   Echo Study Conclusions from 06/2015  - Left ventricle: The cavity size was normal. Wall thickness was increased in a pattern of mild LVH. Systolic function was normal. The estimated ejection fraction was in the range of 55% to 60%. Wall motion was normal; there were no regional wall motion abnormalities. Features are consistent with a pseudonormal left ventricular filling pattern, with concomitant abnormal relaxation and increased filling pressure (grade 2 diastolic dysfunction). - Aortic valve: Cusp separation was reduced. Valve mobility was restricted. Transvalvular velocity was increased. There was severe stenosis. Valve area (VTI): 0.88 cm^2. Valve area (Vmax): 0.77 cm^2. Valve area (Vmean): 0.77 cm^2. - Mitral valve: Moderately calcified annulus. - Left atrium: The atrium was mildly dilated.   Assessment/Plan: 1. CAD with recent PCI to the distal RCA - has residual disease that will be managed medically - readmitted with chest pain and had +troponin consistent with NSTEMI but repeat cath was stable - would favor continued medical management. He is using NTG sl with good response. Would hold on long acting nitrate therapy.   2.  Moderately severe AS - will need to be followed - no cardinal symptoms at this time. EF is normal. Discussed with Dr. Burt Knack here in the office today. He is close to needing valve surgery. No real cardinal symptoms at this point and his symptoms that I first saw him for have really improved with change in medicines. Will plan on echo in January with close follow up in the interim. He understands the cardinal symptoms to be on the look out for.  3. Recent PCI - committed to DAPT - this will more than likely complicate his rectal bleeding/proctitis issue from prior radiation. He is due to see Dr. Watt Climes  4. Weight loss - not clear to me what has been done. We checked his labs. I have encouraged him to see PCP  5. Progressive anemia - looks like this has been progressive over the past 6 months. He is on DAPT due to his DES stent.   6. HLD - on statin   7. Dyspnea - this has improved with changing to Plavix from Brilinta.          Current medicines are reviewed with the patient today.  The patient does not have concerns regarding medicines other than what has been noted above.  The following changes have been made:  See above.  Labs/ tests ordered today include:   No orders of the defined types were placed in this encounter.     Disposition:   FU with me in 2 months.   Patient is agreeable to this plan and will call if any problems develop in the interim.   Signed: Burtis Junes, RN, ANP-C 08/11/2015 10:41 AM  [facility] [address] [place], [place]  [address] Phone: [phone] Fax: [phone]

## 2015-08-11 NOTE — Patient Instructions (Addendum)
We will be checking the following labs today - NONE   Medication Instructions:    Continue with your current medicines.     Testing/Procedures To Be Arranged:  N/A  Follow-Up:   See me in 2 months  We will be planning for repeat echo in January    Other Special Instructions:   See Dr. Redmond School as we discussed today  Call us for any worsening chest pain, shortness of breath or passing out spells.   Call the [place] office at [phone] if you have any questions, problems or concerns.

## 2015-08-12 DIAGNOSIS — N3040 Irradiation cystitis without hematuria: Secondary | ICD-10-CM | POA: Diagnosis not present

## 2015-08-12 DIAGNOSIS — N3289 Other specified disorders of bladder: Secondary | ICD-10-CM | POA: Diagnosis not present

## 2015-08-12 DIAGNOSIS — R310 Gross hematuria: Secondary | ICD-10-CM | POA: Diagnosis not present

## 2015-08-13 ENCOUNTER — Encounter (HOSPITAL_COMMUNITY)
Admission: RE | Admit: 2015-08-13 | Discharge: 2015-08-13 | Disposition: A | Discharge status: Home / Self Care | Payer: Medicare Other | Source: Ambulatory Visit | Attending: Cardiology | Admitting: Cardiology

## 2015-08-13 DIAGNOSIS — I209 Angina pectoris, unspecified: Secondary | ICD-10-CM | POA: Diagnosis not present

## 2015-08-16 ENCOUNTER — Encounter (HOSPITAL_COMMUNITY)
Admission: RE | Admit: 2015-08-16 | Discharge: 2015-08-16 | Disposition: A | Discharge status: Home / Self Care | Payer: Medicare Other | Source: Ambulatory Visit | Attending: Cardiology | Admitting: Cardiology

## 2015-08-16 DIAGNOSIS — I209 Angina pectoris, unspecified: Secondary | ICD-10-CM | POA: Diagnosis not present

## 2015-08-18 ENCOUNTER — Telehealth: Payer: Self-pay | Admitting: Family Medicine

## 2015-08-18 ENCOUNTER — Encounter (HOSPITAL_COMMUNITY): Payer: Medicare Other

## 2015-08-18 NOTE — Telephone Encounter (Signed)
Pt is concerned because he had a cardiac stent placed recently and since then he has been losing weight. He weighs about 145 now, normal weight is 170-175. Pt was advised by other specialist to discuss this with Dr Redmond School for a possible total work up. Pt is out of town in [facility] and planning to get here tomorrow but did not make an appt because of the uncertainty of getting here due to the weather. He will call tomorrow if it looks as if he can make it here for an appt since he understands that Dr Redmond School will be out of the office Friday and all next week.

## 2015-08-19 ENCOUNTER — Encounter: Payer: Self-pay | Admitting: Family Medicine

## 2015-08-19 ENCOUNTER — Ambulatory Visit (INDEPENDENT_AMBULATORY_CARE_PROVIDER_SITE_OTHER): Payer: Medicare Other | Admitting: Family Medicine

## 2015-08-19 VITALS — BP 114/64 | HR 89 | Ht 72.0 in | Wt 150.0 lb

## 2015-08-19 DIAGNOSIS — I200 Unstable angina: Secondary | ICD-10-CM

## 2015-08-19 DIAGNOSIS — Z23 Encounter for immunization: Secondary | ICD-10-CM | POA: Diagnosis not present

## 2015-08-19 DIAGNOSIS — R634 Abnormal weight loss: Secondary | ICD-10-CM | POA: Diagnosis not present

## 2015-08-19 DIAGNOSIS — D500 Iron deficiency anemia secondary to blood loss (chronic): Secondary | ICD-10-CM | POA: Diagnosis not present

## 2015-08-19 DIAGNOSIS — Z955 Presence of coronary angioplasty implant and graft: Secondary | ICD-10-CM | POA: Diagnosis not present

## 2015-08-19 DIAGNOSIS — C61 Malignant neoplasm of prostate: Secondary | ICD-10-CM

## 2015-08-19 DIAGNOSIS — K627 Radiation proctitis: Secondary | ICD-10-CM

## 2015-08-19 NOTE — Progress Notes (Signed)
   Subjective:    Patient ID: Corey Oneill, male    DOB: 11/17/1935, 79 y.o.   MRN: [id number]  HPI He is here for consultation. He recently had cardiac stenting done. Prior to this he has had difficulty with radiation proctitis. He has a history of prostate cancer dating to 2014. He also apparently has had difficulty with colonic polyps and gets colonoscopy every 3 years. He was recently contacted by Dr. Liliane Channel concerning repeat colonoscopy. Review his record indicates that he has had a roughly 8-10 pound weight loss over the last year. His hemoglobin was 13.5 in February of this year and now is just slightly below 10. He has had difficulty with intermittent rectal bleeding thought in the past to be related to the proctitis.   Review of Systems     Objective:   Physical Exam Alert and in no distress otherwise not examined       Assessment & Plan:  Loss of weight  Prostate cancer (HCC)  Radiation proctitis  Need for prophylactic vaccination and inoculation against influenza - Plan: Flu vaccine HIGH DOSE PF (Fluzone High dose)  Stented coronary artery  Anemia due to chronic blood loss  with his weight loss and previous history of proctitis as well as polyps, further evaluation is warranted. He will be sent back to his gastroenterologist or follow-up.

## 2015-08-20 ENCOUNTER — Encounter (HOSPITAL_COMMUNITY): Admission: RE | Admit: 2015-08-20 | Payer: Medicare Other | Source: Ambulatory Visit

## 2015-08-21 ENCOUNTER — Emergency Department (HOSPITAL_COMMUNITY)
Admission: EM | Admit: 2015-08-21 | Discharge: 2015-08-21 | Disposition: A | Discharge status: Home / Self Care | Payer: Medicare Other | Attending: Emergency Medicine | Admitting: Emergency Medicine

## 2015-08-21 ENCOUNTER — Encounter (HOSPITAL_COMMUNITY): Payer: Self-pay | Admitting: Emergency Medicine

## 2015-08-21 ENCOUNTER — Emergency Department (HOSPITAL_COMMUNITY): Payer: Medicare Other

## 2015-08-21 DIAGNOSIS — Z792 Long term (current) use of antibiotics: Secondary | ICD-10-CM | POA: Diagnosis not present

## 2015-08-21 DIAGNOSIS — S0231XA Fracture of orbital floor, right side, initial encounter for closed fracture: Secondary | ICD-10-CM | POA: Insufficient documentation

## 2015-08-21 DIAGNOSIS — Z7902 Long term (current) use of antithrombotics/antiplatelets: Secondary | ICD-10-CM | POA: Diagnosis not present

## 2015-08-21 DIAGNOSIS — Z923 Personal history of irradiation: Secondary | ICD-10-CM | POA: Diagnosis not present

## 2015-08-21 DIAGNOSIS — S0990XA Unspecified injury of head, initial encounter: Secondary | ICD-10-CM | POA: Insufficient documentation

## 2015-08-21 DIAGNOSIS — K219 Gastro-esophageal reflux disease without esophagitis: Secondary | ICD-10-CM | POA: Insufficient documentation

## 2015-08-21 DIAGNOSIS — S0591XA Unspecified injury of right eye and orbit, initial encounter: Secondary | ICD-10-CM | POA: Diagnosis present

## 2015-08-21 DIAGNOSIS — Y998 Other external cause status: Secondary | ICD-10-CM | POA: Insufficient documentation

## 2015-08-21 DIAGNOSIS — S0285XA Fracture of orbit, unspecified, initial encounter for closed fracture: Secondary | ICD-10-CM

## 2015-08-21 DIAGNOSIS — E785 Hyperlipidemia, unspecified: Secondary | ICD-10-CM | POA: Diagnosis not present

## 2015-08-21 DIAGNOSIS — W01198A Fall on same level from slipping, tripping and stumbling with subsequent striking against other object, initial encounter: Secondary | ICD-10-CM | POA: Insufficient documentation

## 2015-08-21 DIAGNOSIS — Y9301 Activity, walking, marching and hiking: Secondary | ICD-10-CM | POA: Diagnosis not present

## 2015-08-21 DIAGNOSIS — I2510 Atherosclerotic heart disease of native coronary artery without angina pectoris: Secondary | ICD-10-CM | POA: Insufficient documentation

## 2015-08-21 DIAGNOSIS — Z79899 Other long term (current) drug therapy: Secondary | ICD-10-CM | POA: Insufficient documentation

## 2015-08-21 DIAGNOSIS — W19XXXA Unspecified fall, initial encounter: Secondary | ICD-10-CM

## 2015-08-21 DIAGNOSIS — Z87891 Personal history of nicotine dependence: Secondary | ICD-10-CM | POA: Diagnosis not present

## 2015-08-21 DIAGNOSIS — S01111A Laceration without foreign body of right eyelid and periocular area, initial encounter: Secondary | ICD-10-CM | POA: Insufficient documentation

## 2015-08-21 DIAGNOSIS — Y92008 Other place in unspecified non-institutional (private) residence as the place of occurrence of the external cause: Secondary | ICD-10-CM | POA: Diagnosis not present

## 2015-08-21 DIAGNOSIS — Z7982 Long term (current) use of aspirin: Secondary | ICD-10-CM | POA: Insufficient documentation

## 2015-08-21 DIAGNOSIS — S0181XA Laceration without foreign body of other part of head, initial encounter: Secondary | ICD-10-CM | POA: Diagnosis not present

## 2015-08-21 DIAGNOSIS — S0280XA Fracture of other specified skull and facial bones, unspecified side, initial encounter for closed fracture: Secondary | ICD-10-CM

## 2015-08-21 DIAGNOSIS — R220 Localized swelling, mass and lump, head: Secondary | ICD-10-CM | POA: Diagnosis not present

## 2015-08-21 DIAGNOSIS — Z8546 Personal history of malignant neoplasm of prostate: Secondary | ICD-10-CM | POA: Diagnosis not present

## 2015-08-21 DIAGNOSIS — S0083XA Contusion of other part of head, initial encounter: Secondary | ICD-10-CM | POA: Diagnosis not present

## 2015-08-21 DIAGNOSIS — R51 Headache: Secondary | ICD-10-CM | POA: Diagnosis not present

## 2015-08-21 DIAGNOSIS — T148XXA Other injury of unspecified body region, initial encounter: Secondary | ICD-10-CM

## 2015-08-21 LAB — CBC WITH DIFFERENTIAL/PLATELET
BASOS PCT: 1 %
Basophils Absolute: 0 10*3/uL (ref 0.0–0.1)
Eosinophils Absolute: 0.1 10*3/uL (ref 0.0–0.7)
Eosinophils Relative: 2 %
HEMATOCRIT: 32.8 % — AB (ref 39.0–52.0)
HEMOGLOBIN: 10.1 g/dL — AB (ref 13.0–17.0)
Lymphocytes Relative: 13 %
Lymphs Abs: 0.8 10*3/uL (ref 0.7–4.0)
MCH: 26.8 pg (ref 26.0–34.0)
MCHC: 30.8 g/dL (ref 30.0–36.0)
MCV: 87 fL (ref 78.0–100.0)
Monocytes Absolute: 0.9 10*3/uL (ref 0.1–1.0)
Monocytes Relative: 15 %
NEUTROS ABS: 4.2 10*3/uL (ref 1.7–7.7)
NEUTROS PCT: 69 %
Platelets: 205 10*3/uL (ref 150–400)
RBC: 3.77 MIL/uL — ABNORMAL LOW (ref 4.22–5.81)
RDW: 18.8 % — ABNORMAL HIGH (ref 11.5–15.5)
WBC: 6 10*3/uL (ref 4.0–10.5)

## 2015-08-21 LAB — TYPE AND SCREEN
ABO/RH(D): O POS
ANTIBODY SCREEN: POSITIVE
DAT, IGG: NEGATIVE

## 2015-08-21 MED ORDER — CEPHALEXIN 250 MG PO CAPS
500.0000 mg | ORAL_CAPSULE | Freq: Once | ORAL | Status: AC
  Administered 2015-08-21: 500 mg via ORAL
  Filled 2015-08-21: qty 2

## 2015-08-21 MED ORDER — CEPHALEXIN 500 MG PO CAPS: 500.0000 mg | ORAL_CAPSULE | Freq: Four times a day (QID) | ORAL | Status: DC

## 2015-08-21 MED ORDER — LIDOCAINE-EPINEPHRINE (PF) 2 %-1:200000 IJ SOLN
10.0000 mL | Freq: Once | INTRAMUSCULAR | Status: AC
  Administered 2015-08-21: 10 mL
  Filled 2015-08-21: qty 20

## 2015-08-21 MED ORDER — TRAMADOL HCL 50 MG PO TABS: 50.0000 mg | ORAL_TABLET | Freq: Four times a day (QID) | ORAL | Status: DC | PRN

## 2015-08-21 NOTE — Discharge Instructions (Signed)
We saw you in the ER after you had a fall. You unfortunately have a fracture around your eyes and sinuses. Take the antibiotics. Return to the Er if you have headaches, fevers, seizures, loss of consciousness, confusion. Return to the ER if you have loss of vision, double vision or you cant move the eyes. Take pain meds if needed.  Ice the area well.  Please be very careful with walking, and do everything possible to prevent falls.  Fall Prevention in Hospitals, Adult As a hospital patient, your condition and the treatments you receive can increase your risk for falls. Some additional risk factors for falls in a hospital include:  Being in an unfamiliar environment.  Being on bed rest.  Your surgery.  Taking certain medicines.  Your tubing requirements, such as intravenous (IV) therapy or catheters. It is important that you learn how to decrease fall risks while at the hospital. Below are important tips that can help prevent falls. SAFETY TIPS FOR PREVENTING FALLS Talk about your risk of falling.  Ask your health care provider why you are at risk for falling. Is it your medicine, illness, tubing placement, or something else?  Make a plan with your health care provider to keep you safe from falls.  Ask your health care provider or pharmacist about side effects of your medicines. Some medicines can make you dizzy or affect your coordination. Ask for help.  Ask for help before getting out of bed. You may need to press your call button.  Ask for assistance in getting safely to the toilet.  Ask for a walker or cane to be put at your bedside. Ask that most of the side rails on your bed be placed up before your health care provider leaves the room.  Ask family or friends to sit with you.  Ask for things that are out of your reach, such as your glasses, hearing aids, telephone, bedside table, or call button. Follow these tips to avoid falling:  Stay lying or seated, rather than  standing, while waiting for help.  Wear rubber-soled slippers or shoes whenever you walk in the hospital.  Avoid quick, sudden movements.  Change positions slowly.  Sit on the side of your bed before standing.  Stand up slowly and wait before you start to walk.  Let your health care provider know if there is a spill on the floor.  Pay careful attention to the medical equipment, electrical cords, and tubes around you.  When you need help, use your call button by your bed or in the bathroom. Wait for one of your health care providers to help you.  If you feel dizzy or unsure of your footing, return to bed and wait for assistance.  Avoid being distracted by the TV, telephone, or another person in your room.  Do not lean or support yourself on rolling objects, such as IV poles or bedside tables.   This information is not intended to replace advice given to you by your health care provider. Make sure you discuss any questions you have with your health care provider.   Document Released: 10/27/2000 Document Revised: 11/20/2014 Document Reviewed: 07/07/2012 Elsevier Interactive Patient Education 2016 [place]. Orbital Floor Fracture, Blowout The orbit, also called the eye socket, is a bony structure that protects the eye. The bottom wall of the orbit is called the orbital floor. It separates the orbit from a sinus. An orbital floor fracture is a break in the orbital floor. This type of fracture  is called a "blowout" when tissues around the eye, including the muscle that is used to make the eye look down, becomes trapped within the fracture. CAUSES  An orbital floor fracture is caused by a direct blow (blunt trauma) to the eye from the front. SIGNS AND SYMPTOMS   A black eye.  Swelling and bruising around the eye.  A gurgling sound when pressure is placed on the eye area.  Seeing two of everything, with one object appearing higher than the other (vertical diplopia). The vertical  diplopia is worse when looking up.  Pain around the eye when looking up.  One eye looks sunken compared to the other eye.  Numbness of the cheek and upper gum on the same side of the face as was injured. DIAGNOSIS  A diagnosis is made with an eye exam. It is confirmed with X-rays or a CT scan of the eye. TREATMENT  You may be prescribed medicines such as antibiotics, steroids, or decongestants. The fracture itself is usually not treated until all the swelling around the eye has gone away. This may take 1-2 weeks. After the swelling has gone away:  If your eye is not trapped within the fracture, you will not need treatment.  If you have persistent vertical double vision, your health care provider may try to free the muscle. If he or she cannot, you may need to have surgery.  If you have double vision, but only when looking up, your health care provider will discuss treatment options with you. Some people who do not spend a lot of time looking up choose not to have additional treatment. Others who need to look up often, such as electricians, need treatment. HOME CARE INSTRUCTIONS  Keep all follow-up visits as directed by your health care provider. This is important.  Take medicines only as directed by your health care provider.  Follow your health care provider's instructions about:  Using ice packs or cold compresses to decrease swelling.  Sleeping with your head elevated.  Always follow recommendations about the wearing of protective glasses or goggles.  Do not wear contact lenses until your health care provider says it is okay.  Do not drive or perform your regular activities without your health care provider's approval. Be aware that if you are only using one eye to see, you may have difficulty with depth perception and the ability to judge distance.  Do not blow your nose.  Stay away from dusty areas.  Avoid traveling by plane or going to high-altitude areas. This may slow  the healing of your swelling and increase sinus pain. SEEK MEDICAL CARE IF:  Your vision changes.  The redness or swelling around the injured eye does not go away or becomes worse.  Blood or discolored discharge comes from your nose.  You have a fever. SEEK IMMEDIATE MEDICAL CARE IF:   You have a sensation that you are seeing flashing lights.  You have sudden blindness. MAKE SURE YOU:  Understand these instructions.  Will watch your condition.  Will get help right away if you are not doing well or get worse.   This information is not intended to replace advice given to you by your health care provider. Make sure you discuss any questions you have with your health care provider.   Document Released: 04/25/2001 Document Revised: 11/20/2014 Document Reviewed: 01/01/2014 Elsevier Interactive Patient Education [employer].

## 2015-08-21 NOTE — ED Provider Notes (Addendum)
CSN: [id number]     Arrival date & time 08/21/15  0867 History  By signing my name below, I, Emmanuella Mensah, attest that this documentation has been prepared under the direction and in the presence of Varney Biles, MD. Electronically Signed: Judithann Sauger, ED Scribe. 08/21/2015. 3:57 AM.    Chief Complaint  Patient presents with  . Facial Laceration   The history is provided by the patient. No language interpreter was used.   HPI Comments: Corey Oneill is a 79 y.o. male who presents to the Emergency Department status post fall that occurred about 4 hours when he slipped and fell while wearing his glasses. He reports associated laceration above the right eye, ecchymosis over the right eye, and mild HA. He denies LOC during the fall. He also denies any fever, chills, dizziness or lightheadedness. He reports that he had a Stent placed in approx 6 weeks ago. He reports that he is currently on Plavix and Aspirin. He reports that due to his Prostate Cancer and exposure to radiation, he has had hematuria.   Past Medical History  Diagnosis Date  . Hyperlipidemia   . Esophageal reflux   . Prostate cancer ([place])   . Hx of radiation therapy 04/14/13-06/09/13    prostate 7800 cGy, 40 sessions, seminal vesicles 5600 cGy 40 sessions  . CAD (coronary artery disease)     a.  LHC 8/16: Mid to distal LAD 30%, OM1 40%, proximal mid RCA 40%, distal RCA 60% >> FFR 0.69  >> PCI: 3 x 15 mm Resolute DES  . Aortic stenosis     a. peak to peak gradient by LHC 8/16:  37 mmHg (moderately severe)    Past Surgical History  Procedure Laterality Date  . Nose surgery    . Tonsillectomy    . Needle biopsy of prostate    . Colonoscopy    . Prostate surgery    . Cardiac catheterization N/A 04/21/2015    Procedure: Right/Left Heart Cath and Coronary Angiography;  Surgeon: [name], MD;  Location: [facility];  Service: Cardiovascular;  Laterality: N/A;  . Cardiac catheterization N/A 06/18/2015     Procedure: Intravascular Pressure Wire/FFR Study;  Surgeon: [name], MD;  Location: [facility];  Service: Cardiovascular;  Laterality: N/A;  . Cardiac catheterization N/A 06/18/2015    Procedure: Coronary Stent Intervention;  Surgeon: [name], MD;  Location: [facility];  Service: Cardiovascular;  Laterality: N/A;  . Cardiac catheterization N/A 06/18/2015    Procedure: Right/Left Heart Cath and Coronary Angiography;  Surgeon: [name], MD;  Location: [facility];  Service: Cardiovascular;  Laterality: N/A;  . Cardiac catheterization N/A 06/23/2015    Procedure: Left Heart Cath and Cors/Grafts Angiography;  Surgeon: [name], MD;  Location: [facility];  Service: Cardiovascular;  Laterality: N/A;   Family History  Problem Relation Age of Onset  . Brain cancer Father   . Depression Daughter   . Lymphoma Mother    Social History  Substance Use Topics  . Smoking status: Former Smoker    Quit date: 03/05/1968  . Smokeless tobacco: Never Used  . Alcohol Use: 7.5 oz/week    15 Standard drinks or equivalent per week     Comment: rare    Review of Systems  Constitutional: Negative for fever and chills.  Skin: Positive for wound.  Neurological: Positive for headaches. Negative for dizziness and light-headedness.      Allergies  Review of patient's allergies indicates no known allergies.  Home Medications   Prior to Admission medications   Medication Sig Start Date End Date Taking? Authorizing Provider  acetaminophen (TYLENOL) 500 MG tablet Take 500 mg by mouth daily as needed (pain).   Yes Historical Provider, MD  aspirin EC 81 MG tablet Take 81 mg by mouth daily.   Yes Historical Provider, MD  buPROPion (WELLBUTRIN SR) 100 MG 12 hr tablet TAKE 1 TABLET TWICE DAILY. 06/17/15  Yes [name], MD  clopidogrel (PLAVIX) 75 MG tablet Take 1 tablet (75 mg total) by mouth daily. 07/26/15  Yes Burtis Junes, NP  Cyanocobalamin (VITAMIN B-12  PO) Take 1 tablet by mouth daily.   Yes Historical Provider, MD  ferrous fumarate (HEMOCYTE - 106 MG FE) 325 (106 FE) MG TABS tablet Take 1 tablet (106 mg of iron total) by mouth daily. 07/26/15  Yes Burtis Junes, NP  FLUoxetine (PROZAC) 10 MG capsule Take 1 capsule (10 mg total) by mouth daily. 03/22/15  Yes [name], MD  Magnesium 250 MG TABS Take 250 mg by mouth daily.    Yes Historical Provider, MD  nitroGLYCERIN (NITROSTAT) 0.4 MG SL tablet Place 0.4 mg under the tongue every 5 (five) minutes as needed for chest pain.   Yes Historical Provider, MD  omeprazole (PRILOSEC) 20 MG capsule Take 1 capsule (20 mg total) by mouth daily. 06/28/15  Yes [name], MD  rosuvastatin (CRESTOR) 10 MG tablet Take 1 tablet (10 mg total) by mouth daily. 06/29/15  Yes [name], MD  cephALEXin (KEFLEX) 500 MG capsule Take 1 capsule (500 mg total) by mouth 4 (four) times daily. 08/21/15   Varney Biles, MD  traMADol (ULTRAM) 50 MG tablet Take 1 tablet (50 mg total) by mouth every 6 (six) hours as needed. 08/21/15   Tieasha Larsen, MD   BP 141/71 mmHg  Pulse 88  Temp(Src) 98.1 F (36.7 C) (Oral)  Resp 14  SpO2 97% Physical Exam  Constitutional: He is oriented to person, place, and time. He appears well-developed and well-nourished.  HENT:  Head: Normocephalic and atraumatic.  Cardiovascular: Normal rate.   Pulmonary/Chest: Effort normal.  Abdominal: He exhibits no distension.  Musculoskeletal:  Upper and lower extremities: No gross deformity or TTP Pelvis is stable.   Neurological: He is alert and oriented to person, place, and time.  Skin: Skin is warm and dry.  1 cm lac with active bleeding Pt has right sided peri-orbital ecchymosis  No hematoma to scalp, no active bleeding to scalp also   Psychiatric: He has a normal mood and affect.  Nursing note and vitals reviewed.   ED Course  Procedures (including critical care time) DIAGNOSTIC STUDIES: Oxygen Saturation is 97% on RA,  adequate by my interpretation.    COORDINATION OF CARE: 3:48 AM- Pt advised of plan for treatment and pt agrees.    Labs Review Labs Reviewed  CBC WITH DIFFERENTIAL/PLATELET - Abnormal; Notable for the following:    RBC 3.77 (*)    Hemoglobin 10.1 (*)    HCT 32.8 (*)    RDW 18.8 (*)    All other components within normal limits  TYPE AND SCREEN    Imaging Review Ct Head Wo Contrast  08/21/2015   CLINICAL DATA:  Status post fall, with laceration above the right eye, and ecchymosis. Mild headache. Initial encounter.  EXAM: CT HEAD WITHOUT CONTRAST  TECHNIQUE: Contiguous axial images were obtained from the base of the  skull through the vertex without intravenous contrast.  COMPARISON:  None.  FINDINGS: There is no evidence of acute infarction, mass lesion, or intra- or extra-axial hemorrhage on CT.  Prominence of the ventricles and sulci reflects mild to moderate cortical volume loss. Mild cerebellar atrophy is noted. Periventricular white matter change likely reflects small vessel ischemic microangiopathy.  The brainstem and fourth ventricle are within normal limits. The basal ganglia are unremarkable in appearance. The cerebral hemispheres demonstrate grossly normal gray-white differentiation. No mass effect or midline shift is seen.  There is a comminuted fracture involving the right orbital floor, extending across the anterior and lateral walls of the right maxillary sinus. The right maxillary sinus is partially filled with blood. There is no definite evidence of herniation of intraorbital contents. The left orbit is unremarkable in appearance. There is partial opacification of the left mastoid air cells. The remaining paranasal sinuses and right mastoid air cells are well-aerated. Soft tissue swelling is noted overlying the right maxilla.  IMPRESSION: 1. No evidence of traumatic intracranial injury. 2. Comminuted fracture involving the right orbital floor, extending across the anterior and lateral  walls of the right maxillary sinus. Right maxillary sinus is partially filled with blood. No definite evidence of herniation of intraorbital contents. 3. Soft tissue swelling overlying the right maxilla. 4. Mild to moderate cortical volume loss and scattered small vessel ischemic microangiopathy. 5. Partial opacification of the left mastoid air cells. These results were called by telephone at the time of interpretation on 08/21/2015 at 5:29 am to Dr. Varney Biles, who verbally acknowledged these results.   Electronically Signed   By: Garald Balding M.D.   On: 08/21/2015 05:30   Varney Biles, MD has personally reviewed and evaluated these images and lab results as part of his medical decision-making.   EKG Interpretation None      Strict return precautions discussed. Pt is not dizzy, lightheaded, and willing to go home. MDM   Final diagnoses:  Fall, initial encounter  Contusion  Hematoma  Orbital wall fracture, closed, initial encounter ([place])    I personally performed the services described in this documentation, which was scribed in my presence. The recorded information has been reviewed and is accurate.  Pt comes in post fall. He is on aspirin and plavix and has a head bleed. His bleed is around the R eye brow and has been constant since 1 am. Wife reports that they went through entire gauze and finally decided to come to the ER. Pt has headaches, no neuro complains. CT head ordered. Lac repaired - which led to hemostasis.   LACERATION REPAIR Performed by: Varney Biles Authorized by: Varney Biles Consent: Verbal consent obtained. Risks and benefits: risks, benefits and alternatives were discussed Consent given by: patient Patient identity confirmed: provided demographic data Prepped and Draped in normal sterile fashion Wound explored  Laceration Location: Right side, below the eye brow  Laceration Length: 1 cm  No Foreign Bodies seen or palpated  Anesthesia:  local infiltration  Local anesthetic: lidocaine 1 % with epinephrine  Anesthetic total: 2 ml  Irrigation method: syringe Amount of cleaning: standard  Skin closure: primary with 5-0 vicryl  Number of sutures: 5  Technique: simple inturrupted  Patient tolerance: Patient tolerated the procedure well with no immediate complications.    Varney Biles, MD 08/21/15 [place], MD 08/21/15 [phone]

## 2015-08-21 NOTE — ED Notes (Signed)
Patient comes from home after losing footing and hitting head against hardwood floor last night 2300.  Patient was walking in the dark, denies dizziness or LOC.  Patient's wife wrapped small laceration above R eye after it occurred and bleeding continued until now.  Patient on Plavix.  Patient A&O x 4, denies other complaints, NAD at this time.  Hematoma noted to R eye, no injury to eye itself.

## 2015-08-21 NOTE — ED Notes (Signed)
MD at bedside. 

## 2015-08-23 ENCOUNTER — Encounter (HOSPITAL_COMMUNITY): Payer: Medicare Other

## 2015-08-25 ENCOUNTER — Telehealth: Payer: Self-pay | Admitting: Medical

## 2015-08-25 ENCOUNTER — Encounter (HOSPITAL_COMMUNITY)
Admission: RE | Admit: 2015-08-25 | Discharge: 2015-08-25 | Disposition: A | Discharge status: Home / Self Care | Payer: Medicare Other | Source: Ambulatory Visit | Attending: Cardiology | Admitting: Cardiology

## 2015-08-25 DIAGNOSIS — I209 Angina pectoris, unspecified: Secondary | ICD-10-CM | POA: Diagnosis not present

## 2015-08-25 NOTE — Telephone Encounter (Signed)
pls call Dr. Thurman Coyer office.   Pt will be having colonoscopy soon.  How long before procedure can his Plavix be stopped, and when after the procedure should the Plavix be resumed?    Thanks [employer]

## 2015-08-25 NOTE — Telephone Encounter (Signed)
Left pt message to please call Dr.Tilleys office to see how to take his plavix.

## 2015-08-27 ENCOUNTER — Encounter (HOSPITAL_COMMUNITY)
Admission: RE | Admit: 2015-08-27 | Discharge: 2015-08-27 | Disposition: A | Discharge status: Home / Self Care | Payer: Medicare Other | Source: Ambulatory Visit | Attending: Cardiology | Admitting: Cardiology

## 2015-08-27 ENCOUNTER — Telehealth: Payer: Self-pay | Admitting: Medical

## 2015-08-27 ENCOUNTER — Encounter (HOSPITAL_BASED_OUTPATIENT_CLINIC_OR_DEPARTMENT_OTHER): Payer: Medicare Other | Attending: Internal Medicine

## 2015-08-27 DIAGNOSIS — Z8546 Personal history of malignant neoplasm of prostate: Secondary | ICD-10-CM | POA: Insufficient documentation

## 2015-08-27 DIAGNOSIS — Z87891 Personal history of nicotine dependence: Secondary | ICD-10-CM | POA: Insufficient documentation

## 2015-08-27 DIAGNOSIS — E785 Hyperlipidemia, unspecified: Secondary | ICD-10-CM | POA: Insufficient documentation

## 2015-08-27 DIAGNOSIS — Z955 Presence of coronary angioplasty implant and graft: Secondary | ICD-10-CM | POA: Insufficient documentation

## 2015-08-27 DIAGNOSIS — I10 Essential (primary) hypertension: Secondary | ICD-10-CM | POA: Diagnosis not present

## 2015-08-27 DIAGNOSIS — Z923 Personal history of irradiation: Secondary | ICD-10-CM | POA: Insufficient documentation

## 2015-08-27 DIAGNOSIS — I350 Nonrheumatic aortic (valve) stenosis: Secondary | ICD-10-CM | POA: Diagnosis not present

## 2015-08-27 DIAGNOSIS — I2510 Atherosclerotic heart disease of native coronary artery without angina pectoris: Secondary | ICD-10-CM | POA: Insufficient documentation

## 2015-08-27 DIAGNOSIS — N3040 Irradiation cystitis without hematuria: Secondary | ICD-10-CM | POA: Insufficient documentation

## 2015-08-27 DIAGNOSIS — I255 Ischemic cardiomyopathy: Secondary | ICD-10-CM | POA: Diagnosis not present

## 2015-08-27 DIAGNOSIS — I209 Angina pectoris, unspecified: Secondary | ICD-10-CM | POA: Diagnosis not present

## 2015-08-27 NOTE — Telephone Encounter (Signed)
pls call patient to see if he called Dr. Thurman Coyer office about when to stop and start his plavix in relation to his upcoming colonoscopy?  I received request from [facility] office, but not sure the message got relayed appropriately

## 2015-08-30 ENCOUNTER — Encounter (HOSPITAL_COMMUNITY)
Admission: RE | Admit: 2015-08-30 | Discharge: 2015-08-30 | Disposition: A | Discharge status: Home / Self Care | Payer: Medicare Other | Source: Ambulatory Visit | Attending: Cardiology | Admitting: Cardiology

## 2015-08-30 ENCOUNTER — Ambulatory Visit (INDEPENDENT_AMBULATORY_CARE_PROVIDER_SITE_OTHER): Payer: Medicare Other | Admitting: Family Medicine

## 2015-08-30 VITALS — BP 122/70 | HR 68 | Ht 72.0 in | Wt 147.0 lb

## 2015-08-30 DIAGNOSIS — I200 Unstable angina: Secondary | ICD-10-CM

## 2015-08-30 DIAGNOSIS — Z955 Presence of coronary angioplasty implant and graft: Secondary | ICD-10-CM

## 2015-08-30 DIAGNOSIS — S0181XA Laceration without foreign body of other part of head, initial encounter: Secondary | ICD-10-CM

## 2015-08-30 DIAGNOSIS — I209 Angina pectoris, unspecified: Secondary | ICD-10-CM | POA: Diagnosis not present

## 2015-08-30 DIAGNOSIS — K627 Radiation proctitis: Secondary | ICD-10-CM

## 2015-08-30 NOTE — Telephone Encounter (Signed)
Said he called but heard nothing back and said he will speak with Dr. Redmond School today 10/17 at his appt @ 2 pm

## 2015-08-30 NOTE — Progress Notes (Signed)
   Subjective:    Patient ID: Corey Oneill, male    DOB: [birth year], 79 y.o.   MRN: [id number]  HPI He is here for a follow-up visit concerning a recent trip to the emergency room after a fall. He now admits that the fall came about because of alcohol consumption. He did admit to drinking more than he normally imbibes. He also has concerns over radiation proctitis. His urologist would like him to go to hyperbaric therapy however this requires a lot of time and he is not sure if he was to do this due to his work schedule. Also of note is recent coronary artery stenting. He apparently will need to schedule to see gastroenterology for follow-up on his continued rectal bleeding.   Review of Systems     Objective:   Physical Exam alert and in no distress. Sutures are noted in the left lateral eyebrow. They're healing nicely. Bruising is noted.      Assessment & Plan:  Radiation proctitis  Stented coronary artery  Facial laceration, initial encounter  The sutures were removed without difficulty. Discussed the hyperbaric therapy with him. He will consider this. I recommended that he hold off on the colonoscopy for several months due to recent coronary artery stenting and also to see if the hyperbaric therapy will work for his proctitis. He does admit to still having difficulty with rectal bleeding.

## 2015-09-01 ENCOUNTER — Encounter (HOSPITAL_COMMUNITY): Payer: Medicare Other

## 2015-09-03 ENCOUNTER — Encounter (HOSPITAL_COMMUNITY): Payer: Medicare Other

## 2015-09-06 ENCOUNTER — Encounter (HOSPITAL_COMMUNITY)
Admission: RE | Admit: 2015-09-06 | Discharge: 2015-09-06 | Disposition: A | Discharge status: Home / Self Care | Payer: Medicare Other | Source: Ambulatory Visit | Attending: Cardiology | Admitting: Cardiology

## 2015-09-06 DIAGNOSIS — I209 Angina pectoris, unspecified: Secondary | ICD-10-CM | POA: Diagnosis not present

## 2015-09-08 ENCOUNTER — Encounter (HOSPITAL_COMMUNITY): Payer: Medicare Other

## 2015-09-10 ENCOUNTER — Encounter (HOSPITAL_COMMUNITY): Payer: Medicare Other

## 2015-09-13 ENCOUNTER — Encounter (HOSPITAL_COMMUNITY)
Admission: RE | Admit: 2015-09-13 | Discharge: 2015-09-13 | Disposition: A | Discharge status: Home / Self Care | Payer: Medicare Other | Source: Ambulatory Visit | Attending: Cardiology | Admitting: Cardiology

## 2015-09-13 DIAGNOSIS — I209 Angina pectoris, unspecified: Secondary | ICD-10-CM | POA: Diagnosis not present

## 2015-09-14 ENCOUNTER — Telehealth: Payer: Self-pay | Admitting: Nurse Practitioner

## 2015-09-14 NOTE — Telephone Encounter (Signed)
New Message   Pt is seeing a nutritionist for him to gain wait   Pt wants to know if CoEnzyne Q10 and he wants to know   if it is okay to take this with his other medications.   Pt wants a colonoscopy i informed him to have the office call us   and he was confused on it. He said he just needs to know if lori wants him to have it now or later.

## 2015-09-15 ENCOUNTER — Encounter (HOSPITAL_COMMUNITY)
Admission: RE | Admit: 2015-09-15 | Discharge: 2015-09-15 | Disposition: A | Discharge status: Home / Self Care | Payer: Medicare Other | Source: Ambulatory Visit | Attending: Cardiology | Admitting: Cardiology

## 2015-09-15 DIAGNOSIS — I209 Angina pectoris, unspecified: Secondary | ICD-10-CM | POA: Insufficient documentation

## 2015-09-15 NOTE — Telephone Encounter (Signed)
Corey Oneill,   He may take the CoQ10. That should not be a problem.  He may proceed with colonoscopy if indicated for his weight loss evaluation but has to do this without having his blood thinner interrupted.   His GI MD will need to send a note over. I can then review with Dr. Burt Knack.   May need to consider virtual colonoscopy.

## 2015-09-15 NOTE — Telephone Encounter (Signed)
Corey Oneill,  I sent the phone note to you about this. Kedra in operator room sent this to me yesterday. This pt has not seen Nicki Reaper, but sees you. Will you see if you can answer the question for this pt. Let me know if there is anything I can do to help.   Thank you Arbie Cookey

## 2015-09-16 NOTE — Telephone Encounter (Signed)
I went over recommendations from Tera Helper. NP for the pt. Pt advised to have GI send note to Tera Helper. NP for her to review with Dr. Burt Knack about colonoscopy. Pt also stated today he forgot to let Cecille Rubin or Dr. Burt Knack know that Dr. Meryl Crutch wants to do some hyperbaric chamber treatments, pt wants to know is this ok. Pt has been advised per Tera Helper. NP ok for the CoQ10. I advised pt that Cecille Rubin and Dr. Burt Knack are both out of the office today and that I will let them both know of our conversation as well as their nurses also. Pt thanked me for my time and speaking with him.

## 2015-09-16 NOTE — Telephone Encounter (Signed)
Lmptcb to go over recommendation per Tera Helper. NP

## 2015-09-17 ENCOUNTER — Encounter (HOSPITAL_COMMUNITY)
Admission: RE | Admit: 2015-09-17 | Discharge: 2015-09-17 | Disposition: A | Discharge status: Home / Self Care | Payer: Medicare Other | Source: Ambulatory Visit | Attending: Cardiology | Admitting: Cardiology

## 2015-09-17 DIAGNOSIS — I209 Angina pectoris, unspecified: Secondary | ICD-10-CM | POA: Diagnosis not present

## 2015-09-17 NOTE — Telephone Encounter (Signed)
I spoke with the pt and made him aware that per Dr Burt Knack there are no contraindications for hyperbaric chamber from a cardiac standpoint.

## 2015-09-20 ENCOUNTER — Encounter (HOSPITAL_COMMUNITY): Payer: Medicare Other

## 2015-09-22 ENCOUNTER — Encounter (HOSPITAL_COMMUNITY)
Admission: RE | Admit: 2015-09-22 | Discharge: 2015-09-22 | Disposition: A | Discharge status: Home / Self Care | Payer: Medicare Other | Source: Ambulatory Visit | Attending: Cardiology | Admitting: Cardiology

## 2015-09-22 DIAGNOSIS — I209 Angina pectoris, unspecified: Secondary | ICD-10-CM | POA: Diagnosis not present

## 2015-09-22 NOTE — Progress Notes (Signed)
Pt returned to rehab today.  Pt expressed to rehab staff that he had two occasions where he took NTG due to chest pain.  Pt reported that these episodes occurred at rest and at bedtime.   Pt tolerated exercise today with no further complaints.  Pt will graduate from cardiac rehab phase II on Friday. Cherre Huger, BSN

## 2015-09-24 ENCOUNTER — Encounter (HOSPITAL_COMMUNITY)
Admission: RE | Admit: 2015-09-24 | Discharge: 2015-09-24 | Disposition: A | Discharge status: Home / Self Care | Payer: Medicare Other | Source: Ambulatory Visit | Attending: Cardiology | Admitting: Cardiology

## 2015-09-24 NOTE — Progress Notes (Signed)
Pt graduated from cardiac rehab program today with completion of 31 exercise sessions in Phase II during a 18 week period. Pt maintained fair attendance to exercise but did miss some due to out of town work travel.  Pt progressed nicely during his participation in rehab as evidenced by increased MET level. Pt increased his MET level from 3.1 to 4.5   Medication list reconciled. Repeat  PHQ score-1.  Pt receives emotional support from his wife but often longs to "turn the hands back"  Pt has resolved that life is the way it is.  Pt is pleased to be under the care of Dr. Burt Knack. Pt demonstrates a good understanding of the usage of NTG for chest pain.  Pt desired to gain weight and is under the care of a nutritionist to assist.   Pt has made significant lifestyle changes and should be commended for his success. Pt feels he has achieved his goals during cardiac rehab.  Pt has increased his stamina and increased his distance with walking.  Pt feels more optimistic regarding his general outlook on life.  Pt plans to continue exercise at a [employer] .  It was indeed a true delight to have this pt participate in cardiac rehab.  We wish him well for the future!  Cherre Huger, BSN

## 2015-09-27 ENCOUNTER — Encounter (HOSPITAL_COMMUNITY): Payer: Medicare Other

## 2015-09-30 ENCOUNTER — Telehealth: Payer: Self-pay | Admitting: Cardiovascular Disease

## 2015-09-30 ENCOUNTER — Encounter (HOSPITAL_BASED_OUTPATIENT_CLINIC_OR_DEPARTMENT_OTHER): Payer: Medicare Other

## 2015-09-30 NOTE — Telephone Encounter (Signed)
This is fine.-thx 

## 2015-09-30 NOTE — Telephone Encounter (Signed)
New Message     Office calling stating that Dr. Dellia Nims is requesting medical clearance for Hyperberic Oxygen Therapy for treatment of radiation cystitis for the pt. Please call back and advise.

## 2015-09-30 NOTE — Telephone Encounter (Signed)
Routing to [place].

## 2015-09-30 NOTE — Telephone Encounter (Signed)
Pt had DES placement August 2016. Shouldn't hold plavix for procedure for a period of at least 6 months.

## 2015-09-30 NOTE — Telephone Encounter (Signed)
Left detailed voicemail with Wound Care that pt is okay to have hyperbaric oxygen therapy per Dr Burt Knack.

## 2015-09-30 NOTE — Telephone Encounter (Signed)
Request for surgical clearance:  1. What type of surgery is being performed? Colonoscopy   2. When is this surgery scheduled? Not scheduled waiting on clearance  3. Are there any medications that need to be held prior to surgery and how long? Plavix typically up to the doctor   4. Name of physician performing surgery? Dr. Earlean Shawl  5. What is your office phone and fax number? Office: [phone] Fax: [phone]  Comments: fax was sent on 10/18 and 11/15.

## 2015-09-30 NOTE — Telephone Encounter (Signed)
I spoke with Corey Oneill and made her aware that the pt can have colonoscopy but cannot stop ASA or Plavix for procedure.  Per Dr Burt Knack the pt will not be able to hold Plavix for at least 6 MONTHS after DES placement August 2016. I will fax this note.

## 2015-10-12 ENCOUNTER — Encounter: Payer: Self-pay | Admitting: Nurse Practitioner

## 2015-10-12 ENCOUNTER — Ambulatory Visit (INDEPENDENT_AMBULATORY_CARE_PROVIDER_SITE_OTHER): Payer: Medicare Other | Admitting: Nurse Practitioner

## 2015-10-12 VITALS — BP 110/64 | HR 72 | Ht 72.0 in | Wt 149.1 lb

## 2015-10-12 DIAGNOSIS — Z955 Presence of coronary angioplasty implant and graft: Secondary | ICD-10-CM

## 2015-10-12 DIAGNOSIS — R5383 Other fatigue: Secondary | ICD-10-CM

## 2015-10-12 DIAGNOSIS — I350 Nonrheumatic aortic (valve) stenosis: Secondary | ICD-10-CM | POA: Diagnosis not present

## 2015-10-12 DIAGNOSIS — E785 Hyperlipidemia, unspecified: Secondary | ICD-10-CM

## 2015-10-12 DIAGNOSIS — I359 Nonrheumatic aortic valve disorder, unspecified: Secondary | ICD-10-CM | POA: Diagnosis not present

## 2015-10-12 DIAGNOSIS — I200 Unstable angina: Secondary | ICD-10-CM

## 2015-10-12 LAB — CBC
HCT: 38.5 % — ABNORMAL LOW (ref 39.0–52.0)
Hemoglobin: 13 g/dL (ref 13.0–17.0)
MCH: 29.8 pg (ref 26.0–34.0)
MCHC: 33.8 g/dL (ref 30.0–36.0)
MCV: 88.3 fL (ref 78.0–100.0)
MPV: 9.9 fL (ref 8.6–12.4)
Platelets: 209 10*3/uL (ref 150–400)
RBC: 4.36 MIL/uL (ref 4.22–5.81)
RDW: 16 % — ABNORMAL HIGH (ref 11.5–15.5)
WBC: 4.3 10*3/uL (ref 4.0–10.5)

## 2015-10-12 LAB — BASIC METABOLIC PANEL
BUN: 14 mg/dL (ref 7–25)
CO2: 29 mmol/L (ref 20–31)
Calcium: 8.7 mg/dL (ref 8.6–10.3)
Chloride: 99 mmol/L (ref 98–110)
Creat: 0.87 mg/dL (ref 0.70–1.18)
Glucose, Bld: 79 mg/dL (ref 65–99)
Potassium: 4.3 mmol/L (ref 3.5–5.3)
Sodium: 135 mmol/L (ref 135–146)

## 2015-10-12 NOTE — Progress Notes (Addendum)
CARDIOLOGY OFFICE NOTE  Date:  10/12/2015    Corey Oneill Date of Birth: Nov 25, 1935 Medical Record [id number]  PCP:  [name], MD  Cardiologist:  Burt Knack    Chief Complaint  Patient presents with  . Coronary Artery Disease    2 month check - seen for Dr. Burt Knack  . Aortic Stenosis    History of Present Illness: Corey Oneill is a 79 y.o. male who presents today for a follow up visit. Seen for Dr. Burt Knack. Former patient of Dr. Thurman Coyer.   He has CAD, AS, prostate cancer, HLD and GERD.  Back earlier this year, he had a several month history of exertional angina. He failed on medical management. Subsequently admitted with progressive symptoms and underwent cardiac catheterization & was found to have a significant stenosis in the distal right coronary artery with an abnormal FFR. He had moderate stenosis in the intermediate branch but the FFR was not severe enough for PCI. He had placement of a 3.0 x 15 mm resolute stent to the distal right coronary artery by Dr. Tamala Julian postdilated to 3.5 mm. He was discharged on BRILINTA and aspirin.   After discharge he initially did ok but then had recurrence chest pain and was advised to go to the emergency room. At time of admission he was largely pain-free and EKG is unremarkable although troponin is 0.18. The patient had elevation of troponin consistent with a non-STEMI. EKG remained unremarkable. Because of his his recent stent implantation in elevations of troponin and chest pain at rest repeat catheterization was advised. This was performed again on 06/23/2015 by Dr. Tamala Julian. The stent site was patent in the distal right coronary artery. The LAD had mild disease in the intermediate branch had moderate stenosis unchanged angiographically as well as mild to moderate proximal right coronary artery stenosis that was unchanged. On review of the films he had a severe stenosis and a very small distal side branch of the right  coronary artery where the wire had been before and it was postulated that perhaps intermittent opening and closing of this artery could've caused his clinical syndrome.  He was started on Ranexa during his last admission. He had some mild penile bleeding while he was on heparin. This improved but he had a mild drop in his hemoglobin. He was advised to follow-up with his urologist as well as his gastroenterologist. He was also to follow up with PCP due to profound weight loss.   I started seeing him back in early September - he was transferring his care to Dr. Burt Knack or to Dr. Tamala Julian at Dr. Thurman Coyer suggestion. Had lots of questions/concerns. Never started Ranexa. Lightheaded - that was his biggest issue. Has had rectal bleeding due to prior radiation for prostate CA. Had seen GU and had a CT - ?thickening of his bladder. Not clear as to what would further need to be done. Had had some shortness of breath - sounds like more with exertion (i.e, going up steps). One spell of chest tightness that he took NTG for. Also concerned about his weight loss - says that over the past several years he has lost probably 20# and did not feel this has been worked up sufficiently. He is 77ft tall. No frank syncope. No CHF symptoms. I stopped his Metoprolol. I ended up changing his Brilinta to Plavix. He did improve clinically. I last saw him at the end of September - he was improved. Some NTG use. No more lightheadedness. Was  going to see his PCP about his weight loss and proceed with probable colonoscopy (without stopping Plavix). He will be needing repeat echo in January to follow his AS. He is felt to be nearing AVR.   Comes back today. Here alone. Not doing as well as he was at our last visit.  He did not proceed with his colonoscopy - he has postponed this with Dr. Earlean Shawl. Due to start hyperbaric therapy in December for his hematuria/proctitis. He notes he is using more NTG since last visit - taking about 2 a day -  usually at bedtime due to more discomfort in his chest. "Almost" went to the ER a few nights ago. Says it is mostly "low level".  Seems to do ok during the day. Feels like he has more stress. Had ran out of B12 and got back on - after 2 doses - started with the hematuria - even had clots. I did not think this was related and the pharmacist that he spoke with said the same thing. Has talked with urology and now his urine is clearing - just "pink" now. Was not seen by urology. No syncope. Not really lightheaded but has to be careful with position changes.   Past Medical History  Diagnosis Date  . Hyperlipidemia   . Esophageal reflux   . Prostate cancer ([place])   . Hx of radiation therapy 04/14/13-06/09/13    prostate 7800 cGy, 40 sessions, seminal vesicles 5600 cGy 40 sessions  . CAD (coronary artery disease)     a.  LHC 8/16: Mid to distal LAD 30%, OM1 40%, proximal mid RCA 40%, distal RCA 60% >> FFR 0.69  >> PCI: 3 x 15 mm Resolute DES  . Aortic stenosis     a. peak to peak gradient by LHC 8/16:  37 mmHg (moderately severe)     Past Surgical History  Procedure Laterality Date  . Nose surgery    . Tonsillectomy    . Needle biopsy of prostate    . Colonoscopy    . Prostate surgery    . Cardiac catheterization N/A 04/21/2015    Procedure: Right/Left Heart Cath and Coronary Angiography;  Surgeon: [name], MD;  Location: [facility];  Service: Cardiovascular;  Laterality: N/A;  . Cardiac catheterization N/A 06/18/2015    Procedure: Intravascular Pressure Wire/FFR Study;  Surgeon: [name], MD;  Location: [name];  Service: Cardiovascular;  Laterality: N/A;  . Cardiac catheterization N/A 06/18/2015    Procedure: Coronary Stent Intervention;  Surgeon: [name], MD;  Location: [facility];  Service: Cardiovascular;  Laterality: N/A;  . Cardiac catheterization N/A 06/18/2015    Procedure: Right/Left Heart Cath and Coronary Angiography;  Surgeon: [name], MD;   Location: [facility];  Service: Cardiovascular;  Laterality: N/A;  . Cardiac catheterization N/A 06/23/2015    Procedure: Left Heart Cath and Cors/Grafts Angiography;  Surgeon: [name], MD;  Location: [facility];  Service: Cardiovascular;  Laterality: N/A;     Medications: Current Outpatient Prescriptions  Medication Sig Dispense Refill  . acetaminophen (TYLENOL) 500 MG tablet Take 500 mg by mouth daily as needed (pain).    [name] aspirin EC 81 MG tablet Take 81 mg by mouth daily.    [name] buPROPion (WELLBUTRIN SR) 100 MG 12 hr tablet TAKE 1 TABLET TWICE DAILY. 60 tablet 5  . clopidogrel (PLAVIX) 75 MG tablet Take 1 tablet (75 mg total) by mouth daily.  30 tablet 9  . Cyanocobalamin (VITAMIN B-12 PO) Take 1 tablet by mouth daily.    . ferrous fumarate (HEMOCYTE - 106 MG FE) 325 (106 FE) MG TABS tablet Take 1 tablet (106 mg of iron total) by mouth daily. 30 each 0  . FLUoxetine (PROZAC) 10 MG capsule Take 1 capsule (10 mg total) by mouth daily. 90 capsule 3  . Magnesium 250 MG TABS Take 250 mg by mouth daily.     . nitroGLYCERIN (NITROSTAT) 0.4 MG SL tablet Place 0.4 mg under the tongue every 5 (five) minutes as needed for chest pain.    [name] omeprazole (PRILOSEC) 20 MG capsule Take 1 capsule (20 mg total) by mouth daily. 90 capsule 3  . rosuvastatin (CRESTOR) 10 MG tablet Take 1 tablet (10 mg total) by mouth daily. 90 tablet 3   No current facility-administered medications for this visit.    Allergies: No Known Allergies  Social History: The patient  reports that he quit smoking about 47 years ago. He has never used smokeless tobacco. He reports that he drinks about 7.5 oz of alcohol per week. He reports that he does not use illicit drugs.   Family History: The patient's family history includes Brain cancer in his father; Depression in his daughter; Lymphoma in his mother.   Review of Systems: Please see the history of present illness.   Otherwise, the review of systems is  positive for none.   All other systems are reviewed and negative.   Physical Exam: VS:  BP 110/64 mmHg  Pulse 72  Ht 6' (1.829 m)  Wt 149 lb 1.9 oz (67.64 kg)  BMI 20.22 kg/m2  SpO2 99% .  BMI Body mass index is 20.22 kg/(m^2).  Wt Readings from Last 3 Encounters:  10/12/15 149 lb 1.9 oz (67.64 kg)  08/30/15 147 lb (66.679 kg)  08/19/15 150 lb (68.04 kg)    General: Pleasant. He is tall and rather thin. He is in no acute distress.  HEENT: Normal. Neck: Supple, no JVD, carotid bruits, or masses noted.  Cardiac: Regular rate and rhythm. He has an outflow murmur of AS noted. No edema.  Respiratory:  Lungs are clear to auscultation bilaterally with normal work of breathing.  GI: Soft and nontender.  MS: No deformity or atrophy. Gait and ROM intact. Skin: Warm and dry. Color is normal.  Neuro:  Strength and sensation are intact and no gross focal deficits noted.  Psych: Alert, appropriate and with normal affect.   LABORATORY DATA:  EKG:  EKG is ordered today. This shows NSR.   Lab Results  Component Value Date   WBC 6.0 08/21/2015   HGB 10.1* 08/21/2015   HCT 32.8* 08/21/2015   PLT 205 08/21/2015   GLUCOSE 84 07/23/2015   CHOL 155 06/18/2015   TRIG 99 06/18/2015   HDL 49 06/18/2015   LDLCALC 86 06/18/2015   ALT 12* 06/22/2015   AST 22 06/22/2015   NA 135 07/23/2015   K 4.0 07/23/2015   CL 100 07/23/2015   CREATININE 0.91 07/23/2015   BUN 17 07/23/2015   CO2 27 07/23/2015   TSH 5.87* 07/23/2015   PSA 5.08* 12/02/2012   PSA 4.97* 12/02/2012   INR 1.24 06/17/2015    BNP (last 3 results) No results for input(s): BNP in the last 8760 hours.  ProBNP (last 3 results)  Recent Labs  07/23/15 1453  PROBNP 109.0*     Other Studies Reviewed Today:  Cardiac Cath Conclusion from 06/23/2015  1. Widely patent distal right coronary stent. 2. Diffuse proximal to mid RCA disease with up to 50% narrowing, unchanged from the prior study. 3. 40-50% mid first  obtuse marginal unchanged from prior study. Widely patent LAD with diffuse plaquing up to 30-40%. 4. No new source for angina is identified. The current episode of chest pain if cardiac could've been related to coronary spasm. No evidence of thrombus is noted. 5. Left ventriculography was not performed     Cardiac Cath Conclusion from 06/18/2015    6. 1st Mrg lesion, 40% stenosed. FFR was 0.83 7. Prox RCA to Mid RCA lesion, 40% stenosed. 8. Mid LAD to Dist LAD lesion, 30% stenosed. 9. Dist RCA lesion, 60% stenosed but with FFR .69. There is a 0% residual stenosis post intervention. 10. A drug-eluting stent was placed. 11. Moderately severe aortic stenosis with a valve area calculating 0.81 - 1.2 cm depending upon the cardiac output used.   Moderately severe aortic stenosis, unchanged since previous catheterization 2-1/2 months ago.  Crescendo angina felt secondary to progression of right coronary disease.  Angiographic 60% distal stenosis in the right coronary documented to be hemodynamically significant by FFR 0.69.  Successful drug-eluting stent placement in the distal right coronary reducing the 60% stenosis to 0% with TIMI grade 3 flow. Vessel occlusion during the procedure reproduced the patient's exertional angina.  FFR evaluation of obtuse marginal #1 revealed no evidence of hemodynamic significance with a value 0.83.  Normal left ventricular function   Recommendations:   Check renal function and overall clinical status in a.m. and if stable the patient will be eligible for discharge.  Dual antiplatelets therapy for at least 6 months. We have chosen aspirin and Brilinta.   Echo Study Conclusions from 06/2015  - Left ventricle: The cavity size was normal. Wall thickness was increased in a pattern of mild LVH. Systolic function was normal. The estimated ejection fraction was in the range of 55% to 60%. Wall motion was normal; there were no regional wall  motion abnormalities. Features are consistent with a pseudonormal left ventricular filling pattern, with concomitant abnormal relaxation and increased filling pressure (grade 2 diastolic dysfunction). - Aortic valve: Cusp separation was reduced. Valve mobility was restricted. Transvalvular velocity was increased. There was severe stenosis. Valve area (VTI): 0.88 cm^2. Valve area (Vmax): 0.77 cm^2. Valve area (Vmean): 0.77 cm^2. - Mitral valve: Moderately calcified annulus. - Left atrium: The atrium was mildly dilated.   Assessment/Plan: 1. CAD with prior PCI to the distal RCA in August of 2016 - has residual disease that will be managed medically - readmitted with chest pain and had +troponin consistent with NSTEMI but repeat cath in August of 2016 was stable - would favor continued medical management. He is using NTG sl now almost on a daily basis - not really exertional - almost always at night but clearly a change.   2. Moderately severe AS - will need to be followed - now with more angina. No syncope and breathing is stable with no evidence of CHF. Will need to go ahead and get his echo now - further disposition to follow.   3. Recent PCI - committed to DAPT - this will more than likely complicate his rectal bleeding/proctitis issue from prior radiation. To start hyperbaric therapy in December.   4. Weight loss - seeing PCP - not really discussed today.   5. Progressive anemia - looks like this has been progressive over the past 6 months. He is on DAPT due  to his DES stent. Needs repeat lab today.   6. HLD - on statin   7. Dyspnea - this has improved with changing to Plavix from Brilinta.              Current medicines are reviewed with the patient today.  The patient does not have concerns regarding medicines other than what has been noted above.  The following changes have been made:  See above.  Labs/ tests ordered today include:    Orders Placed This  Encounter  Procedures  . Basic metabolic panel  . CBC  . EKG 12-Lead  . ECHOCARDIOGRAM COMPLETE   Disposition:   Further disposition to follow. Will see how the echo turns out and then we will decide where to go next in regards to his management.   Patient is agreeable to this plan and will call if any problems develop in the interim.   Signed: Burtis Junes, RN, ANP-C 10/12/2015 9:09 AM  [facility] [address] [place], [place]  [address] Phone: [phone] Fax: [phone]      Addendum:  Discussed by phone with Dr. Burt Knack who is in agreement with this plan.

## 2015-10-12 NOTE — Patient Instructions (Addendum)
We will be checking the following labs today - BMET and CBC  Medication Instructions:    Continue with your current medicines.     Testing/Procedures To Be Arranged:  Echocardiogram   Follow-Up:   We will see how the echo turns out and then decide where we go from here.    Other Special Instructions:   N/A    If you need a refill on your cardiac medications before your next appointment, please call your pharmacy.   Call the [facility] office at [phone] if you have any questions, problems or concerns.

## 2015-10-13 DIAGNOSIS — C61 Malignant neoplasm of prostate: Secondary | ICD-10-CM | POA: Diagnosis not present

## 2015-10-13 DIAGNOSIS — N3040 Irradiation cystitis without hematuria: Secondary | ICD-10-CM | POA: Diagnosis not present

## 2015-10-13 DIAGNOSIS — R310 Gross hematuria: Secondary | ICD-10-CM | POA: Diagnosis not present

## 2015-10-15 ENCOUNTER — Ambulatory Visit (INDEPENDENT_AMBULATORY_CARE_PROVIDER_SITE_OTHER): Payer: Medicare Other | Admitting: Medical

## 2015-10-15 ENCOUNTER — Encounter: Payer: Self-pay | Admitting: Medical

## 2015-10-15 VITALS — BP 120/78 | Wt 149.0 lb

## 2015-10-15 DIAGNOSIS — N3040 Irradiation cystitis without hematuria: Secondary | ICD-10-CM

## 2015-10-15 DIAGNOSIS — R319 Hematuria, unspecified: Secondary | ICD-10-CM

## 2015-10-15 DIAGNOSIS — Z8546 Personal history of malignant neoplasm of prostate: Secondary | ICD-10-CM

## 2015-10-15 DIAGNOSIS — R310 Gross hematuria: Secondary | ICD-10-CM | POA: Diagnosis not present

## 2015-10-15 DIAGNOSIS — N329 Bladder disorder, unspecified: Secondary | ICD-10-CM

## 2015-10-15 DIAGNOSIS — I200 Unstable angina: Secondary | ICD-10-CM | POA: Diagnosis not present

## 2015-10-15 LAB — POCT URINALYSIS DIPSTICK
Bilirubin, UA: NEGATIVE
Glucose, UA: NEGATIVE
SPEC GRAV UA: 1.015
Urobilinogen, UA: 0.2
pH, UA: 8.5

## 2015-10-15 NOTE — Progress Notes (Signed)
Subjective: Chief Complaint  Patient presents with  . hematuria    started sunday. no pains or anything. was on a b12 and it was a low dose and when he ran out he bought it realized it was more mg. pharmacist said that was ok. said clots pass with his urine. said at his urologist they took a sample and that was  on wednesday and they never got back to him.    Here for hematuria.   He notes hx/o prostate cancer, hx/o hematuria, saw Urology in 07/2015 for hematuria.  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   He notes having some frank hematuria and clots in urine on and aff prior to September and some thereafter.  But had a period of time with just a few episodes of hematuria until this week when he got a lot of blood in the urine this past week.   Denies fever. He does have polyuria, polydipsia.  No recent pelvic pain or trauma,no weight change, no fever, no back pain, no NVD.  He reports that his dietician just started him on 2500mg  Vitamin B12 daily which he started in the past week x 2 days, thought this may have prompted the hematuria.  No other aggravating or relieving factors. No other complaint.   Objective: BP 120/78 mmHg  Wt 149 lb (67.586 kg)  General appearance: alert, no distress, WD/WN Abdomen: +bs, soft, non tender, non distended, no masses, no hepatomegaly, no splenomegaly Back: nontender Pulses: 2+ symmetric   Assessment: Encounter Diagnoses  Name Primary?  . Hematuria Yes  . Radiation cystitis   . History of prostate cancer   . Lesion of bladder     Plan: reviewed 07/2015 urology notes as well as last labs and last few visit notes in chart.  When he had cystoscopy in 07/2015 there was concern for 2 lesions, one in the bladder wall and one in the urethra that were erythematous and Urology was concerned that he may  experience worse bleeding and problems.  So they wanted him to pursue hyperbaric therapy which he has not been able to do.    Called Urology and discussed case with Dr .Diona Fanti.   They will see him now, so sent patient over to Urology for eval and management.

## 2015-10-18 ENCOUNTER — Other Ambulatory Visit (HOSPITAL_COMMUNITY): Payer: Medicare Other

## 2015-10-22 ENCOUNTER — Encounter (HOSPITAL_BASED_OUTPATIENT_CLINIC_OR_DEPARTMENT_OTHER): Payer: Medicare Other

## 2015-11-01 ENCOUNTER — Other Ambulatory Visit: Payer: Self-pay

## 2015-11-01 ENCOUNTER — Ambulatory Visit (HOSPITAL_COMMUNITY): Payer: Medicare Other | Attending: Cardiovascular Disease

## 2015-11-01 DIAGNOSIS — I517 Cardiomegaly: Secondary | ICD-10-CM | POA: Insufficient documentation

## 2015-11-01 DIAGNOSIS — E785 Hyperlipidemia, unspecified: Secondary | ICD-10-CM

## 2015-11-01 DIAGNOSIS — I350 Nonrheumatic aortic (valve) stenosis: Secondary | ICD-10-CM | POA: Insufficient documentation

## 2015-11-01 DIAGNOSIS — I059 Rheumatic mitral valve disease, unspecified: Secondary | ICD-10-CM | POA: Insufficient documentation

## 2015-11-01 DIAGNOSIS — Z955 Presence of coronary angioplasty implant and graft: Secondary | ICD-10-CM | POA: Diagnosis not present

## 2015-11-01 DIAGNOSIS — I071 Rheumatic tricuspid insufficiency: Secondary | ICD-10-CM | POA: Diagnosis not present

## 2015-11-01 DIAGNOSIS — R5383 Other fatigue: Secondary | ICD-10-CM | POA: Diagnosis not present

## 2015-11-03 ENCOUNTER — Telehealth: Payer: Self-pay | Admitting: Family Medicine

## 2015-11-03 NOTE — Telephone Encounter (Signed)
Pt come in and just wanted to let you know that he has a follow up appt with Dr Burt Knack Next Thursday about his ECHO

## 2015-11-04 DIAGNOSIS — L820 Inflamed seborrheic keratosis: Secondary | ICD-10-CM | POA: Diagnosis not present

## 2015-11-04 DIAGNOSIS — L308 Other specified dermatitis: Secondary | ICD-10-CM | POA: Diagnosis not present

## 2015-11-04 DIAGNOSIS — L905 Scar conditions and fibrosis of skin: Secondary | ICD-10-CM | POA: Diagnosis not present

## 2015-11-11 ENCOUNTER — Encounter: Payer: Self-pay | Admitting: Cardiovascular Disease

## 2015-11-11 ENCOUNTER — Ambulatory Visit (INDEPENDENT_AMBULATORY_CARE_PROVIDER_SITE_OTHER): Payer: Medicare Other | Admitting: Cardiovascular Disease

## 2015-11-11 VITALS — BP 126/70 | HR 76 | Ht 72.0 in | Wt 147.2 lb

## 2015-11-11 DIAGNOSIS — I350 Nonrheumatic aortic (valve) stenosis: Secondary | ICD-10-CM | POA: Diagnosis not present

## 2015-11-11 DIAGNOSIS — I200 Unstable angina: Secondary | ICD-10-CM

## 2015-11-11 NOTE — Progress Notes (Signed)
Cardiology Office Note Date:  11/11/2015   ID:  Corey Oneill, DOB 02/23/1936, MRN [id number]  PCP:  [name], MD  Cardiologist:  [name], MD    No chief complaint on file.  History of Present Illness: Corey Oneill is a 79 y.o. male who presents for follow-up of CAD and aortic stenosis. He was initially seen in May 2016 with exertional angina and moderate aortic stenosis by echo. He underwent cath demonstrating moderate CAD and moderate AS - medical therapy was recommended. He then went on to have recurrent angina in progressive fashion with repeat cath, FFR, and PCI in August. His symptoms have never really improved with PCI. Repeat cath was done and showed no changes in anatomy. He recently underwent a repeat echo suggestive of severe AS with findings as outlined below.   He is here today with his wife. Complains of an ache in the center of his chest that occurs with stress and has occurred with brisk walking. He denies dyspnea, orthopnea, or PND. He complains of dizziness without frank syncope. Continues to have some rectal bleeding related to radiation proctitis.    Past Medical History  Diagnosis Date  . Hyperlipidemia   . Esophageal reflux   . Prostate cancer ([place])   . Hx of radiation therapy 04/14/13-06/09/13    prostate 7800 cGy, 40 sessions, seminal vesicles 5600 cGy 40 sessions  . CAD (coronary artery disease)     a.  LHC 8/16: Mid to distal LAD 30%, OM1 40%, proximal mid RCA 40%, distal RCA 60% >> FFR 0.69  >> PCI: 3 x 15 mm Resolute DES  . Aortic stenosis     a. peak to peak gradient by LHC 8/16:  37 mmHg (moderately severe)     Past Surgical History  Procedure Laterality Date  . Nose surgery    . Tonsillectomy    . Needle biopsy of prostate    . Colonoscopy    . Prostate surgery    . Cardiac catheterization N/A 04/21/2015    Procedure: Right/Left Heart Cath and Coronary Angiography;  Surgeon: [name], MD;  Location: [facility];   Service: Cardiovascular;  Laterality: N/A;  . Cardiac catheterization N/A 06/18/2015    Procedure: Intravascular Pressure Wire/FFR Study;  Surgeon: [name], MD;  Location: [facility];  Service: Cardiovascular;  Laterality: N/A;  . Cardiac catheterization N/A 06/18/2015    Procedure: Coronary Stent Intervention;  Surgeon: [name], MD;  Location: [facility];  Service: Cardiovascular;  Laterality: N/A;  . Cardiac catheterization N/A 06/18/2015    Procedure: Right/Left Heart Cath and Coronary Angiography;  Surgeon: [name], MD;  Location: [facility];  Service: Cardiovascular;  Laterality: N/A;  . Cardiac catheterization N/A 06/23/2015    Procedure: Left Heart Cath and Cors/Grafts Angiography;  Surgeon: [name], MD;  Location: [facility];  Service: Cardiovascular;  Laterality: N/A;    Current Outpatient Prescriptions  Medication Sig Dispense Refill  . acetaminophen (TYLENOL) 500 MG tablet Take 500 mg by mouth daily as needed (pain).    [name] aspirin EC 81 MG tablet Take 81 mg by mouth daily.    [name] buPROPion (WELLBUTRIN SR) 100 MG 12 hr tablet Take 100 mg by mouth 2 (two) times daily.    . clopidogrel (PLAVIX) 75 MG tablet Take 1 tablet (75 mg total) by mouth daily. 30 tablet 9  . ferrous fumarate (HEMOCYTE - 106 MG FE) 325 (  106 FE) MG TABS tablet Take 1 tablet (106 mg of iron total) by mouth daily. 30 each 0  . FLUoxetine (PROZAC) 10 MG capsule Take 1 capsule (10 mg total) by mouth daily. 90 capsule 3  . Magnesium 250 MG TABS Take 250 mg by mouth daily.     . nitroGLYCERIN (NITROSTAT) 0.4 MG SL tablet Place 0.4 mg under the tongue every 5 (five) minutes as needed for chest pain.    . rosuvastatin (CRESTOR) 10 MG tablet Take 1 tablet (10 mg total) by mouth daily. 90 tablet 3   No current facility-administered medications for this visit.    Allergies:   Review of patient's allergies indicates no known allergies.   Social History:  The patient  reports  that he quit smoking about 47 years ago. He has never used smokeless tobacco. He reports that he drinks about 7.5 oz of alcohol per week. He reports that he does not use illicit drugs.   Family History:  The patient's  family history includes Brain cancer in his father; Depression in his daughter; Lymphoma in his mother.    ROS:  Please see the history of present illness.  Otherwise, review of systems is positive for weight loss, hearing loss, blood in stool, balance problems.  All other systems are reviewed and negative.    PHYSICAL EXAM: VS:  BP 126/70 mmHg  Pulse 76  Ht 6' (1.829 m)  Wt 147 lb 3.2 oz (66.769 kg)  BMI 19.96 kg/m2 , BMI Body mass index is 19.96 kg/(m^2). GEN: Well nourished, well developed, in no acute distress HEENT: normal Neck: no JVD, no masses. No carotid bruits Cardiac: RRR with distant heart sounds and 2/6 systolic murmur at the RUSB               Respiratory:  clear to auscultation bilaterally, normal work of breathing GI: soft, nontender, nondistended, + BS MS: no deformity or atrophy Ext: no pretibial edema, pedal pulses 2+= bilaterally Skin: warm and dry, no rash Neuro:  Strength and sensation are intact Psych: euthymic mood, full affect  EKG:  EKG is not ordered today.  Recent Labs: 06/22/2015: ALT 12* 07/23/2015: Pro B Natriuretic peptide (BNP) 109.0*; TSH 5.87* 10/12/2015: BUN 14; Creat 0.87; Hemoglobin 13.0; Platelets 209; Potassium 4.3; Sodium 135   Lipid Panel     Component Value Date/Time   CHOL 155 06/18/2015 0234   TRIG 99 06/18/2015 0234   HDL 49 06/18/2015 0234   CHOLHDL 3.2 06/18/2015 0234   VLDL 20 06/18/2015 0234   LDLCALC 86 06/18/2015 0234      Wt Readings from Last 3 Encounters:  11/11/15 147 lb 3.2 oz (66.769 kg)  10/15/15 149 lb (67.586 kg)  10/12/15 149 lb 1.9 oz (67.64 kg)     Cardiac Studies Reviewed: Cardiac Cath 04/21/2015: Procedures    Right/Left Heart Cath and Coronary Angiography    Conclusion    FINAL  CONCLUSIONS: 1. HEAVILY CALCIFIED AORTIC VALVE WITH HEMODYNAMIC FINDINGS CONSISTENT WITH MODERATE AORTIC STENOSIS 2. MODERATE STENOSIS OF THE FIRST OM BRANCH OF THE LEFT CIRCUMFLEX 3. HEAVILY CALCIFIED CORONARY ARTERIES WITH MILD NONOBSTRUCTIVE DISEASE OF THE RCA AND LAD, MODERATE LCX STENOSIS 4. NORMAL LV SYSTOLIC FUNCTION 5. NORMAL RIGHT HEART HEMODYNAMICS  RECOMMENDATIONS:  MEDICAL THERAPY FOR CAD AND SERIAL ECHO FOLLOW-UP FOR AORTIC STENOSIS    Indications    Exertional angina (HCC) [I20.8 (ICD-10-CM)]   Aortic stenosis [I35.0 (ICD-10-CM)]    Technique and Indications    INDICATION: 79 year old gentleman with  aortic stenosis and exertional angina. He presents for cardiac catheterization to better define the degree of aortic stenosis and evaluate his coronary anatomy. His symptoms are of recent onset over the last several months.  PROCEDURAL DETAILS: There was an indwelling IV in a right antecubital vein. Using normal sterile technique, the IV was changed out for a 5 Fr brachial sheath over a 0.018 inch wire. The right wrist was then prepped, draped, and anesthetized with 1% lidocaine. Using the modified Seldinger technique a 5/6 French Slender sheath was placed in the right radial artery. Intra-arterial verapamil was administered through the radial artery sheath. IV heparin was administered after a JR4 catheter was advanced into the central aorta. A Swan-Ganz catheter was used for the right heart catheterization. Standard protocol was followed for recording of right heart pressures and sampling of oxygen saturations. Fick cardiac output was calculated. Standard Judkins catheters were used for selective coronary angiography and left ventriculography. The aortic valve was difficult to cross. Multiple catheters were attempted. A JR4 catheter directing a straight tip wire was eventually successful in crossing the valve. This was changed out over an exchange length J-wire for a pigtail  catheter. There were no immediate procedural complications. The patient was transferred to the post catheterization recovery area for further monitoring.  There were no immediate complications during the procedure.    Coronary Findings    Dominance: Right   Left Main  The vessel is angiographically normal.     Left Anterior Descending  There is mildthe vessel.   . Prox LAD lesion, 30% stenosed. calcified .     Left Circumflex  There is mildthe vessel. There is a bifurcation lesion at the branch point of the first obtuse marginal. The lesion is eccentric and is asked visualized in the cranial views. It appears nonobstructive in the caudal views. I do not think there is more than 70% stenosis present.   . First Obtuse Marginal Branch   . Ost 1st Mrg to 1st Mrg lesion, 70% stenosed.     Right Coronary Artery  There is mildthe vessel.   . Prox RCA lesion, 40% stenosed. calcified .   [name] Dist RCA lesion, 30% stenosed. calcified .       Right Heart Pressures Hemodynamic findings consistent with aortic stenosis. LV EDP is normal.    Wall Motion                 Left Heart    Left Ventricle The left ventricular size is normal. The left ventricular systolic function is normal. The left ventricular ejection fraction is 55-65% by visual estimate. There are no wall motion abnormalities in the left ventricle.   Aortic Valve There is moderate aortic valve stenosis. The aortic valve is calcified. There is restricted aortic valve motion. Aortic valve hemodynamics: Mean gradient 23 mmHg, calculated aortic valve area 1.35 cm    Coronary Diagrams    Diagnostic Diagram            Implants    Name ID Temporary Type Supply   No information to display    PACS Images    Show images for Cardiac catheterization     Link to Procedure Log    Procedure Log      Hemo Data       Most Recent Value   Fick Cardiac Output  5.81 L/min   Fick Cardiac Output Index   3.09 (L/min)/BSA   Aortic Mean Gradient  22.9 mmHg   Aortic Peak Gradient  25 mmHg   Aortic Valve Area  1.35   Aortic Value Area Index  0.72 cm2/BSA   RA A Wave  4 mmHg   RA V Wave  1 mmHg   RA Mean  1 mmHg   RV Systolic Pressure  23 mmHg   RV Diastolic Pressure  0 mmHg   RV EDP  5 mmHg   PA Systolic Pressure  19 mmHg   PA Diastolic Pressure  2 mmHg   PA Mean  9 mmHg   PW A Wave  10 mmHg   PW V Wave  9 mmHg   PW Mean  5 mmHg   AO Systolic Pressure  98 mmHg   AO Diastolic Pressure  45 mmHg   AO Mean  71 mmHg   LV Systolic Pressure  Q000111Q mmHg   LV Diastolic Pressure  2 mmHg   LV EDP  9 mmHg   Arterial Occlusion Pressure Extended Systolic Pressure  123456 mmHg   Arterial Occlusion Pressure Extended Diastolic Pressure  58 mmHg   Arterial Occlusion Pressure Extended Mean Pressure  83 mmHg   Left Ventricular Apex Extended Systolic Pressure  [id number] mmHg   Left Ventricular Apex Extended Diastolic Pressure  4 mmHg   Left Ventricular Apex Extended EDP Pressure  9 mmHg   QP/QS  1   TPVR Index  2.91 HRUI   TSVR Index  22.98 HRUI   PVR SVR Ratio  0.06   TPVR/TSVR Ratio  0.13     Cardiac Cath 06/18/15: Conclusion    6. 1st Mrg lesion, 40% stenosed. FFR was 0.83 7. Prox RCA to Mid RCA lesion, 40% stenosed. 8. Mid LAD to Dist LAD lesion, 30% stenosed. 9. Dist RCA lesion, 60% stenosed but with FFR .69. There is a 0% residual stenosis post intervention. 10. A drug-eluting stent was placed. 11. Moderately severe aortic stenosis with a valve area calculating 0.81 - 1.2 cm depending upon the cardiac output used.   Moderately severe aortic stenosis, unchanged since previous catheterization 2-1/2 months ago.  Crescendo angina felt secondary to progression of right coronary disease.  Angiographic 60% distal stenosis in the right coronary documented to be hemodynamically significant by FFR 0.69.  Successful drug-eluting stent placement in the distal right  coronary reducing the 60% stenosis to 0% with TIMI grade 3 flow. Vessel occlusion during the procedure reproduced the patient's exertional angina.  FFR evaluation of obtuse marginal #1 revealed no evidence of hemodynamic significance with a value 0.83.  Normal left ventricular function   Recommendations:   Check renal function and overall clinical status in a.m. and if stable the patient will be eligible for discharge.  Dual antiplatelets therapy for at least 6 months. We have chosen aspirin and Brilinta.   2D Echo 11/01/2015: Study Conclusions  - Left ventricle: The cavity size was normal. There was mild concentric hypertrophy. Systolic function was normal. The estimated ejection fraction was in the range of 55% to 60%. Wall motion was normal; there were no regional wall motion abnormalities. Doppler parameters are consistent with abnormal left ventricular relaxation (grade 1 diastolic dysfunction). - Aortic valve: Valve mobility was restricted. There was severe stenosis. Valve area (Vmax): 0.82 cm^2. - Mitral valve: Moderately calcified annulus. - Right ventricle: The cavity size was normal. Wall thickness was normal. Systolic function was normal. - Tricuspid valve: There was trivial regurgitation. - Inferior vena cava: The vessel was normal in size. The respirophasic diameter changes were in the normal range (>= 50%), consistent with normal  central venous pressure.  Impressions:  - Although peak velocity and mean gradient calculate as moderate, there appears to be severe stenosis of the aortic valve. The calculated valve area indicates severe stenosis. It is likely that the peak gradient was not identified in this study. Study is limited by poor sound transmission.  ASSESSMENT AND PLAN: 1. Severe aortic stenosis 2. CAD with angina, CCS Class 2 3. Radiation proctitis with episodic bleeding 4. Hyperlipidemia  I have reviewed the natural  history of aortic stenosis with the patient and his wife who is present today. We have discussed the limitations of medical therapy and the poor prognosis associated with symptomatic aortic stenosis. We have reviewed potential treatment options. We discussed treatment options in the context of this patient's specific comorbid medical conditions. Favor surgical consultation for consideration of aortic valve replacement. While he doesn't have a mean gradient > 40 mm Hg, he continues to have symptoms and his aortic valve is severely calcified and restricted with a dimensionless index of 0.21. I think he does in fact have severe symptomatic AS. Will need to decide whether he will require any further coronary revascularization as well.  Current medicines are reviewed with the patient today.  The patient does not have concerns regarding medicines.  Labs/ tests ordered today include:  No orders of the defined types were placed in this encounter.    Disposition:   FU pending surgical evaluation  Signed, [name], MD  11/11/2015 12:05 PM    [facility] [address], [place],   [address] Phone: [phone]; Fax: [phone]

## 2015-11-11 NOTE — Patient Instructions (Signed)
Medication Instructions:  Your physician recommends that you continue on your current medications as directed. Please refer to the Current Medication list given to you today.  Labwork: No new orders.   Testing/Procedures: No new orders.   Follow-Up: You have been referred to Dr [name] at [facility] for Aortic Valve Replacement.    Any Other Special Instructions Will Be Listed Below (If Applicable).     If you need a refill on your cardiac medications before your next appointment, please call your pharmacy.

## 2015-11-12 ENCOUNTER — Inpatient Hospital Stay (HOSPITAL_COMMUNITY)
Admission: EM | Admit: 2015-11-12 | Discharge: 2015-11-21 | DRG: 338 | Disposition: A | Discharge status: Home / Self Care | Payer: Medicare Other | Attending: General Surgery | Admitting: General Surgery

## 2015-11-12 ENCOUNTER — Encounter (HOSPITAL_COMMUNITY): Payer: Self-pay

## 2015-11-12 ENCOUNTER — Emergency Department (HOSPITAL_COMMUNITY): Payer: Medicare Other

## 2015-11-12 DIAGNOSIS — R1031 Right lower quadrant pain: Secondary | ICD-10-CM | POA: Diagnosis not present

## 2015-11-12 DIAGNOSIS — K625 Hemorrhage of anus and rectum: Secondary | ICD-10-CM | POA: Diagnosis present

## 2015-11-12 DIAGNOSIS — Z8546 Personal history of malignant neoplasm of prostate: Secondary | ICD-10-CM | POA: Diagnosis not present

## 2015-11-12 DIAGNOSIS — I252 Old myocardial infarction: Secondary | ICD-10-CM

## 2015-11-12 DIAGNOSIS — K219 Gastro-esophageal reflux disease without esophagitis: Secondary | ICD-10-CM | POA: Diagnosis present

## 2015-11-12 DIAGNOSIS — Z7982 Long term (current) use of aspirin: Secondary | ICD-10-CM | POA: Diagnosis not present

## 2015-11-12 DIAGNOSIS — I350 Nonrheumatic aortic (valve) stenosis: Secondary | ICD-10-CM | POA: Diagnosis present

## 2015-11-12 DIAGNOSIS — Z7902 Long term (current) use of antithrombotics/antiplatelets: Secondary | ICD-10-CM

## 2015-11-12 DIAGNOSIS — Z818 Family history of other mental and behavioral disorders: Secondary | ICD-10-CM

## 2015-11-12 DIAGNOSIS — R319 Hematuria, unspecified: Secondary | ICD-10-CM | POA: Diagnosis present

## 2015-11-12 DIAGNOSIS — I25119 Atherosclerotic heart disease of native coronary artery with unspecified angina pectoris: Secondary | ICD-10-CM | POA: Diagnosis present

## 2015-11-12 DIAGNOSIS — K3589 Other acute appendicitis: Secondary | ICD-10-CM | POA: Diagnosis not present

## 2015-11-12 DIAGNOSIS — E785 Hyperlipidemia, unspecified: Secondary | ICD-10-CM | POA: Diagnosis present

## 2015-11-12 DIAGNOSIS — K353 Acute appendicitis with localized peritonitis, without perforation or gangrene: Secondary | ICD-10-CM

## 2015-11-12 DIAGNOSIS — Z79899 Other long term (current) drug therapy: Secondary | ICD-10-CM

## 2015-11-12 DIAGNOSIS — K3580 Unspecified acute appendicitis: Secondary | ICD-10-CM

## 2015-11-12 DIAGNOSIS — Z955 Presence of coronary angioplasty implant and graft: Secondary | ICD-10-CM

## 2015-11-12 DIAGNOSIS — Z9049 Acquired absence of other specified parts of digestive tract: Secondary | ICD-10-CM | POA: Diagnosis present

## 2015-11-12 DIAGNOSIS — E43 Unspecified severe protein-calorie malnutrition: Secondary | ICD-10-CM | POA: Diagnosis present

## 2015-11-12 DIAGNOSIS — K627 Radiation proctitis: Secondary | ICD-10-CM | POA: Diagnosis present

## 2015-11-12 DIAGNOSIS — Z923 Personal history of irradiation: Secondary | ICD-10-CM | POA: Diagnosis not present

## 2015-11-12 DIAGNOSIS — Z01810 Encounter for preprocedural cardiovascular examination: Secondary | ICD-10-CM | POA: Diagnosis not present

## 2015-11-12 DIAGNOSIS — R197 Diarrhea, unspecified: Secondary | ICD-10-CM

## 2015-11-12 DIAGNOSIS — K37 Unspecified appendicitis: Secondary | ICD-10-CM | POA: Diagnosis not present

## 2015-11-12 DIAGNOSIS — Z87891 Personal history of nicotine dependence: Secondary | ICD-10-CM | POA: Diagnosis not present

## 2015-11-12 DIAGNOSIS — I2510 Atherosclerotic heart disease of native coronary artery without angina pectoris: Secondary | ICD-10-CM | POA: Diagnosis not present

## 2015-11-12 HISTORY — DX: Radiation proctitis: K62.7

## 2015-11-12 HISTORY — DX: Depression, unspecified: F32.A

## 2015-11-12 HISTORY — DX: Family history of other specified conditions: Z84.89

## 2015-11-12 HISTORY — DX: Major depressive disorder, single episode, unspecified: F32.9

## 2015-11-12 HISTORY — DX: Personal history of other venous thrombosis and embolism: Z86.718

## 2015-11-12 HISTORY — DX: Angina pectoris, unspecified: I20.9

## 2015-11-12 HISTORY — DX: Cardiac murmur, unspecified: R01.1

## 2015-11-12 LAB — CBC
HEMATOCRIT: 41.1 % (ref 39.0–52.0)
HEMOGLOBIN: 13.9 g/dL (ref 13.0–17.0)
MCH: 30.3 pg (ref 26.0–34.0)
MCHC: 33.8 g/dL (ref 30.0–36.0)
MCV: 89.5 fL (ref 78.0–100.0)
Platelets: 243 10*3/uL (ref 150–400)
RBC: 4.59 MIL/uL (ref 4.22–5.81)
RDW: 14.5 % (ref 11.5–15.5)
WBC: 9.7 10*3/uL (ref 4.0–10.5)

## 2015-11-12 LAB — URINE MICROSCOPIC-ADD ON

## 2015-11-12 LAB — COMPREHENSIVE METABOLIC PANEL
ALBUMIN: 3.5 g/dL (ref 3.5–5.0)
ALT: 14 U/L — ABNORMAL LOW (ref 17–63)
ANION GAP: 8 (ref 5–15)
AST: 21 U/L (ref 15–41)
Alkaline Phosphatase: 83 U/L (ref 38–126)
BUN: 11 mg/dL (ref 6–20)
CO2: 26 mmol/L (ref 22–32)
Calcium: 8.8 mg/dL — ABNORMAL LOW (ref 8.9–10.3)
Chloride: 99 mmol/L — ABNORMAL LOW (ref 101–111)
Creatinine, Ser: 0.83 mg/dL (ref 0.61–1.24)
GFR calc Af Amer: >60 mL/min (ref >60)
GFR calc non Af Amer: >60 mL/min (ref >60)
GLUCOSE: 108 mg/dL — AB (ref 65–99)
POTASSIUM: 4.4 mmol/L (ref 3.5–5.1)
SODIUM: 133 mmol/L — AB (ref 135–145)
Total Bilirubin: 0.7 mg/dL (ref 0.3–1.2)
Total Protein: 6.7 g/dL (ref 6.5–8.1)

## 2015-11-12 LAB — URINALYSIS, ROUTINE W REFLEX MICROSCOPIC
BILIRUBIN URINE: NEGATIVE
Glucose, UA: NEGATIVE mg/dL
Ketones, ur: 15 mg/dL — AB
NITRITE: NEGATIVE
PH: 7.5 (ref 5.0–8.0)
Protein, ur: 30 mg/dL — AB
SPECIFIC GRAVITY, URINE: 1.022 (ref 1.005–1.030)

## 2015-11-12 LAB — I-STAT TROPONIN, ED: Troponin i, poc: 0 ng/mL (ref 0.00–0.08)

## 2015-11-12 LAB — LIPASE, BLOOD: Lipase: 20 U/L (ref 11–51)

## 2015-11-12 MED ORDER — ONDANSETRON HCL 4 MG/2ML IJ SOLN
4.0000 mg | Freq: Once | INTRAMUSCULAR | Status: AC
  Administered 2015-11-12: 4 mg via INTRAVENOUS
  Filled 2015-11-12: qty 2

## 2015-11-12 MED ORDER — ASPIRIN EC 81 MG PO TBEC
81.0000 mg | DELAYED_RELEASE_TABLET | Freq: Every day | ORAL | Status: DC
  Administered 2015-11-12 – 2015-11-21 (×8): 81 mg via ORAL
  Filled 2015-11-12 (×9): qty 1

## 2015-11-12 MED ORDER — BUPROPION HCL ER (SR) 100 MG PO TB12
100.0000 mg | ORAL_TABLET | Freq: Every day | ORAL | Status: DC
Start: 2015-11-12 — End: 2015-11-21
  Administered 2015-11-12 – 2015-11-21 (×10): 100 mg via ORAL
  Filled 2015-11-12 (×11): qty 1

## 2015-11-12 MED ORDER — ONDANSETRON 4 MG PO TBDP
4.0000 mg | ORAL_TABLET | Freq: Four times a day (QID) | ORAL | Status: DC | PRN
  Filled 2015-11-12: qty 1

## 2015-11-12 MED ORDER — DEXTROSE-NACL 5-0.9 % IV SOLN
INTRAVENOUS | Status: DC
  Administered 2015-11-12 – 2015-11-14 (×3): via INTRAVENOUS

## 2015-11-12 MED ORDER — PIPERACILLIN-TAZOBACTAM 3.375 G IVPB 30 MIN
3.3750 g | Freq: Once | INTRAVENOUS | Status: AC
  Administered 2015-11-12: 3.375 g via INTRAVENOUS
  Filled 2015-11-12: qty 50

## 2015-11-12 MED ORDER — CLOPIDOGREL BISULFATE 75 MG PO TABS
75.0000 mg | ORAL_TABLET | Freq: Every day | ORAL | Status: DC
  Administered 2015-11-12 – 2015-11-13 (×2): 75 mg via ORAL
  Filled 2015-11-12 (×3): qty 1

## 2015-11-12 MED ORDER — ENOXAPARIN SODIUM 40 MG/0.4ML ~~LOC~~ SOLN
40.0000 mg | SUBCUTANEOUS | Status: DC
  Administered 2015-11-12 – 2015-11-20 (×7): 40 mg via SUBCUTANEOUS
  Filled 2015-11-12 (×8): qty 0.4

## 2015-11-12 MED ORDER — SODIUM CHLORIDE 0.9 % IV BOLUS (SEPSIS)
250.0000 mL | Freq: Once | INTRAVENOUS | Status: AC
  Administered 2015-11-12: 250 mL via INTRAVENOUS

## 2015-11-12 MED ORDER — IOHEXOL 300 MG/ML  SOLN
80.0000 mL | Freq: Once | INTRAMUSCULAR | Status: AC | PRN
  Administered 2015-11-12: 80 mL via INTRAVENOUS

## 2015-11-12 MED ORDER — PIPERACILLIN-TAZOBACTAM 3.375 G IVPB
3.3750 g | Freq: Three times a day (TID) | INTRAVENOUS | Status: DC
  Administered 2015-11-12 – 2015-11-20 (×23): 3.375 g via INTRAVENOUS
  Filled 2015-11-12 (×30): qty 50

## 2015-11-12 MED ORDER — MORPHINE SULFATE (PF) 2 MG/ML IV SOLN
1.0000 mg | INTRAVENOUS | Status: DC | PRN
  Administered 2015-11-13 – 2015-11-18 (×2): 2 mg via INTRAVENOUS
  Filled 2015-11-12 (×4): qty 1

## 2015-11-12 MED ORDER — FERROUS FUMARATE 325 (106 FE) MG PO TABS
1.0000 | ORAL_TABLET | Freq: Every day | ORAL | Status: DC
  Administered 2015-11-13 – 2015-11-21 (×8): 106 mg via ORAL
  Filled 2015-11-12 (×12): qty 1

## 2015-11-12 MED ORDER — ACETAMINOPHEN 650 MG RE SUPP: 650.0000 mg | Freq: Four times a day (QID) | RECTAL | Status: DC | PRN

## 2015-11-12 MED ORDER — ONDANSETRON HCL 4 MG/2ML IJ SOLN
4.0000 mg | Freq: Four times a day (QID) | INTRAMUSCULAR | Status: DC | PRN
  Administered 2015-11-12: 4 mg via INTRAVENOUS
  Filled 2015-11-12: qty 2

## 2015-11-12 MED ORDER — ACETAMINOPHEN 325 MG PO TABS
650.0000 mg | ORAL_TABLET | Freq: Four times a day (QID) | ORAL | Status: DC | PRN
  Administered 2015-11-14: 650 mg via ORAL
  Filled 2015-11-12: qty 2

## 2015-11-12 MED ORDER — NITROGLYCERIN 0.4 MG SL SUBL
0.4000 mg | SUBLINGUAL_TABLET | SUBLINGUAL | Status: DC | PRN
  Administered 2015-11-15 – 2015-11-17 (×2): 0.4 mg via SUBLINGUAL
  Filled 2015-11-12 (×2): qty 1

## 2015-11-12 MED ORDER — MAGNESIUM 200 MG PO TABS
200.0000 mg | ORAL_TABLET | Freq: Every day | ORAL | Status: DC
  Administered 2015-11-12 – 2015-11-21 (×10): 200 mg via ORAL
  Filled 2015-11-12 (×19): qty 1

## 2015-11-12 MED ORDER — FLUOXETINE HCL 10 MG PO CAPS
10.0000 mg | ORAL_CAPSULE | Freq: Every day | ORAL | Status: DC
  Administered 2015-11-12 – 2015-11-21 (×10): 10 mg via ORAL
  Filled 2015-11-12 (×11): qty 1

## 2015-11-12 MED ORDER — ROSUVASTATIN CALCIUM 10 MG PO TABS
10.0000 mg | ORAL_TABLET | Freq: Every day | ORAL | Status: DC
  Administered 2015-11-12 – 2015-11-21 (×10): 10 mg via ORAL
  Filled 2015-11-12 (×11): qty 1

## 2015-11-12 NOTE — ED Notes (Signed)
CT notified RN that pt. Was dry heaving in CT. RN notified EDP.

## 2015-11-12 NOTE — ED Notes (Signed)
EDP at bedside  

## 2015-11-12 NOTE — Consult Note (Signed)
CARDIOLOGY CONSULT NOTE   Patient ID: Corey Oneill MRN: [id number] DOB/AGE: 77/04/1936 77 y.o.  Admit date: 11/12/2015  Primary Physician   [name], MD Primary Cardiologist   Dr. Burt Knack  Reason for Consultation   Pre-op cleareance Requesting Physician [place], [place] ANP  HPI: Corey Oneill is a 79 y.o. male with a history of CAD, prostate cancer, hyperlipidemia, aortic stenosis, proctitis, ongoing rectal bleeding who presented to [facility] ED 12/30 for evaluation of worsening abdominal pain.  Hx of CAD with recent PCI to the distal RCA 06/18/15- has residual disease that will be managed medically - readmitted with chest pain and had +troponin consistent with NSTEMI but repeat cath 06/23/2015 was stable - would favor continued medical management. The patient is on DAPT with aspirin and Plavix which is complicated by rectal bleeding/proctitis. Initial plan was to procced with colonoscopy with Dr. Earlean Shawl, however postponed due to start of hyperbaric therapy in December for his hematuria/proctitis (followed by Junious Silk). The patient was seen by Dr. Burt Knack yesterday 11/11/15  for routine evaluation and he was referred to see Dr. Silvio Clayman 11/17/15 for surgical evaluation of AS. He did not mention his abdomen symptoms to Dr. Burt Knack yesterday as was not bothersome enough to mention.  Patient has a 3 days history of severe abdominal pain at the periumbilical/right lower quadrant associated with chills, nausea and sweating.The patient denies vomiting or diarrhea. His appetite has been poor. The patient denies chest pain, shortness of breath, palpitation, LE extremity edema, orthopnea, PND or syncope.  CT of abdomen and pelvis reveals appendicitis.Cardiology is consulted for preop clearance prior to appendectomy.  Past Medical History  Diagnosis Date  . Hyperlipidemia   . Esophageal reflux   . Prostate cancer ([place])   . Hx of radiation therapy 04/14/13-06/09/13    prostate 7800  cGy, 40 sessions, seminal vesicles 5600 cGy 40 sessions  . CAD (coronary artery disease)     a.  LHC 8/16: Mid to distal LAD 30%, OM1 40%, proximal mid RCA 40%, distal RCA 60% >> FFR 0.69  >> PCI: 3 x 15 mm Resolute DES  . Aortic stenosis     a. peak to peak gradient by LHC 8/16:  37 mmHg (moderately severe)   . Radiation proctitis      Past Surgical History  Procedure Laterality Date  . Nose surgery    . Tonsillectomy    . Needle biopsy of prostate    . Colonoscopy    . Cardiac catheterization N/A 04/21/2015    Procedure: Right/Left Heart Cath and Coronary Angiography;  Surgeon: [name], MD;  Location: [place] [facility];  Service: Cardiovascular;  Laterality: N/A;  . Cardiac catheterization N/A 06/18/2015    Procedure: Intravascular Pressure Wire/FFR Study;  Surgeon: [name], MD;  Location: [place] [facility];  Service: Cardiovascular;  Laterality: N/A;  . Cardiac catheterization N/A 06/18/2015    Procedure: Coronary Stent Intervention;  Surgeon: [name], MD;  Location: [place] [facility];  Service: Cardiovascular;  Laterality: N/A;  . Cardiac catheterization N/A 06/18/2015    Procedure: Right/Left Heart Cath and Coronary Angiography;  Surgeon: [name], MD;  Location: [place] [facility];  Service: Cardiovascular;  Laterality: N/A;  . Cardiac catheterization N/A 06/23/2015    Procedure: Left Heart Cath and Cors/Grafts Angiography;  Surgeon: [name], MD;  Location: [place] [facility];  Service: Cardiovascular;  Laterality: N/A;    No Known Allergies  I have reviewed the  patient's current medications . aspirin EC  81 mg Oral Daily  . buPROPion  100 mg Oral Daily  . clopidogrel  75 mg Oral Daily  . enoxaparin (LOVENOX) injection  40 mg Subcutaneous Q24H  . ferrous fumarate  1 tablet Oral Daily  . FLUoxetine  10 mg Oral Daily  . Magnesium  250 mg Oral Daily  . rosuvastatin  10 mg Oral Daily   . dextrose 5 % and 0.9% NaCl    . piperacillin-tazobactam (ZOSYN)   IV     acetaminophen **OR** acetaminophen, morphine injection, nitroGLYCERIN, ondansetron **OR** ondansetron (ZOFRAN) IV  Prior to Admission medications   Medication Sig Start Date End Date Taking? Authorizing Provider  acetaminophen (TYLENOL) 500 MG tablet Take 500 mg by mouth daily as needed (pain).   Yes Historical Provider, MD  aspirin EC 81 MG tablet Take 81 mg by mouth daily.   Yes Historical Provider, MD  buPROPion (WELLBUTRIN SR) 100 MG 12 hr tablet Take 100 mg by mouth daily.    Yes Historical Provider, MD  clopidogrel (PLAVIX) 75 MG tablet Take 1 tablet (75 mg total) by mouth daily. 07/26/15  Yes Burtis Junes, NP  ferrous fumarate (HEMOCYTE - 106 MG FE) 325 (106 FE) MG TABS tablet Take 1 tablet (106 mg of iron total) by mouth daily. 07/26/15  Yes Burtis Junes, NP  FLUoxetine (PROZAC) 10 MG capsule Take 1 capsule (10 mg total) by mouth daily. 03/22/15  Yes [name], MD  Magnesium 250 MG TABS Take 250 mg by mouth daily.    Yes Historical Provider, MD  nitroGLYCERIN (NITROSTAT) 0.4 MG SL tablet Place 0.4 mg under the tongue every 5 (five) minutes as needed for chest pain.   Yes Historical Provider, MD  rosuvastatin (CRESTOR) 10 MG tablet Take 1 tablet (10 mg total) by mouth daily. 06/29/15  Yes [name], MD     Social History   Social History  . Marital Status: Married    Spouse Name: N/A  . Number of Children: 2  . Years of Education: N/A   Occupational History  . Not on file.   Social History Main Topics  . Smoking status: Former Smoker    Quit date: 03/05/1968  . Smokeless tobacco: Never Used  . Alcohol Use: 7.5 oz/week    15 Standard drinks or equivalent per week     Comment: rare  . Drug Use: No  . Sexual Activity: Not Currently   Other Topics Concern  . Not on file   Social History Narrative    Family Status  Relation Status Death Age  . Father Deceased 81  . Mother Deceased 18   Family History  Problem Relation Age of Onset  . Brain cancer  Father   . Depression Daughter   . Lymphoma Mother     ROS:  Full 14 point review of systems complete and found to be negative unless listed above.  Physical Exam: Blood pressure 105/66, pulse 94, temperature 97.5 F (36.4 C), temperature source Axillary, resp. rate 16, height 6' (1.829 m), weight 142 lb (64.411 kg), SpO2 95 %.  General: Well developed, well nourished, male in no acute distress Head: Eyes PERRLA, No xanthomas. Normocephalic and atraumatic, oropharynx without edema or exudate.  Lungs: Resp regular and unlabored, CTA. Heart: RRR no s3, s4, 3/6 systolic  murmurs..   Neck: No carotid bruits. No lymphadenopathy.  No JVD. Abdomen: Bowel sounds present, abdomen soft TTP over RLQ.  Msk:  No  spine or cva tenderness. No weakness, no joint deformities or effusions. Extremities: No clubbing, cyanosis or edema. DP/PT/Radials 2+ and equal bilaterally. Neuro: Alert and oriented X 3. No focal deficits noted. Psych:  Good affect, responds appropriately Skin: No rashes or lesions noted.  Labs:   Lab Results  Component Value Date   WBC 9.7 11/12/2015   HGB 13.9 11/12/2015   HCT 41.1 11/12/2015   MCV 89.5 11/12/2015   PLT 243 11/12/2015   No results for input(s): INR in the last 72 hours.  Recent Labs Lab 11/12/15 0806  NA 133*  K 4.4  CL 99*  CO2 26  BUN 11  CREATININE 0.83  CALCIUM 8.8*  PROT 6.7  BILITOT 0.7  ALKPHOS 83  ALT 14*  AST 21  GLUCOSE 108*  ALBUMIN 3.5    Recent Labs  11/12/15 0844  TROPIPOC 0.00   PRO B NATRIURETIC PEPTIDE (BNP)  Date/Time Value Ref Range Status  07/23/2015 02:53 PM 109.0* 0.0 - 100.0 pg/mL Final   Lab Results  Component Value Date   CHOL 155 06/18/2015   HDL 49 06/18/2015   LDLCALC 86 06/18/2015   TRIG 99 06/18/2015   No results found for: DDIMER LIPASE  Date/Time Value Ref Range Status  11/12/2015 08:06 AM 20 11 - 51 U/L Final    Echo: 11/01/15 LV EF: 55% -   60%  ------------------------------------------------------------------- Indications:   (I35.0).  ------------------------------------------------------------------- History:  PMH: Acquired from the patient and from the patient&'s chart. Coronary artery disease. Aortic stenosis. Risk factors: Dyslipidemia.  ------------------------------------------------------------------- Study Conclusions  - Left ventricle: The cavity size was normal. There was mild concentric hypertrophy. Systolic function was normal. The estimated ejection fraction was in the range of 55% to 60%. Wall motion was normal; there were no regional wall motion abnormalities. Doppler parameters are consistent with abnormal left ventricular relaxation (grade 1 diastolic dysfunction). - Aortic valve: Valve mobility was restricted. There was severe stenosis. Valve area (Vmax): 0.82 cm^2. - Mitral valve: Moderately calcified annulus. - Right ventricle: The cavity size was normal. Wall thickness was normal. Systolic function was normal. - Tricuspid valve: There was trivial regurgitation. - Inferior vena cava: The vessel was normal in size. The respirophasic diameter changes were in the normal range (>= 50%), consistent with normal central venous pressure.  Impressions:  - Although peak velocity and mean gradient calculate as moderate, there appears to be severe stenosis of the aortic valve. The calculated valve area indicates severe stenosis. It is likely that the peak gradient was not identified in this study. Study is limited by poor sound transmission.   ECG: Vent. rate 91 BPM PR interval 171 ms QRS duration 94 ms QT/QTc 383/471 ms P-R-T axes 78 84 74  Cath 06/23/15 Left Heart Cath and Cors/Grafts Angiography    Conclusion     Widely patent distal right coronary stent.  Diffuse proximal to mid RCA disease with up to 50% narrowing, unchanged from the prior  study.  40-50% mid first obtuse marginal unchanged from prior study. Widely patent LAD with diffuse plaquing up to 30-40%.  No new source for angina is identified. The current episode of chest pain if cardiac could've been related to coronary spasm. No evidence of thrombus is noted.  Left ventriculography was not performed    RECOMMENDATION:   Continue medical therapy. Consider adding long-acting nitrates if tolerated by blood pressure.  Eligible for discharge at the discretion of the treating team/Dr. Wynonia Lawman     Radiology:  Ct Abdomen  Pelvis W Contrast  11/12/2015  CLINICAL DATA:  Right lower quadrant pain increasing over the past 4 days, initial encounter EXAM: CT ABDOMEN AND PELVIS WITH CONTRAST TECHNIQUE: Multidetector CT imaging of the abdomen and pelvis was performed using the standard protocol following bolus administration of intravenous contrast. CONTRAST:  76mL OMNIPAQUE IOHEXOL 300 MG/ML  SOLN COMPARISON:  07/14/2015 FINDINGS: Lung bases are free of acute infiltrate or sizable effusion. Multiple calcified hilar lymph nodes are noted consistent prior granulomatous disease. Mild coronary calcifications are seen. The liver, gallbladder, spleen, adrenal glands and pancreas are normal in their CT appearance with the exception of a solitary cyst in the liver on image number 48 of series 2. Cystic changes are noted within the kidneys bilaterally. No renal calculi or obstructive changes are noted. The right lower quadrant a considerable amount of inflammatory change is noted. There are 2 appendicoliths identified within a dilated and inflamed appendix consistent with ascites. Inflammatory change extends into the cecum. No perforation or abscess is identified at this time. The bony structures show mild degenerative change of the lumbar spine. IMPRESSION: Changes consistent with appendicitis without perforation. Changes of prior granulomatous disease. Electronically Signed   By: Inez Catalina  M.D.   On: 11/12/2015 11:00    ASSESSMENT AND PLAN:     Corey Oneill is a 80 y.o. male with a history of CAD, prostate cancer, hyperlipidemia, aortic stenosis, proctitis and  ongoing rectal bleeding who presented to [facility] ED 12/30 for evaluation of worsening abdominal pain.  1. CAD - s/p  recent PCI to the distal RCA 06/18/15- has residual disease that will be managed medically - readmitted with chest pain and had +troponin consistent with NSTEMI but repeat cath 06/23/2015 was stable  - DAPT with ASA and plavix. Last dose of plavix 11/11/15.  - Echo 11/01/15 showed left ventricular function of 0000000, grade 1 diastolic dysfunction, no wall motion abnormality and severe aortic stenosis. - M.D. to see for surgical evaluation clearance.  2. Severe aortic stenosis - he was referred to see Dr. [name] 11/17/15 for surgical evaluation   3. Ongoing rectal bleeding/ proctitis - Initial plan was to procced with colonoscopy with Dr. Earlean Shawl, however postponed due to start of hyperbaric therapy in December for his hematuria/proctitis (followes by Junious Silk).   4. Acute appendicitis - Currently on nonoperative management with bowel rest and antibiotic.  - Pending Dr. Percival Spanish recommendations for surgery.   SignedLeanor Kail, PA 11/12/2015, 2:24 PM Pager [phone]  Co-Sign MD  History and all data above reviewed.  Patient examined.  He has recent symptoms of SOB.  He has a chronic atypical chest pain syndrome that is not changed since his most recent cath.    I agree with the findings as above.  The patient exam reveals COR:RRR  ,  Lungs: Clear  ,  Abd: Positive bowel sounds, no rebound no guarding, Ext No edema  .  All available labs, radiology testing, previous records reviewed. Agree with documented assessment and plan. PREOP:  The patient is at moderate risk should he need surgical treatment of his appendix.  However, for now he is having conservative treatment.  Therefore, we  will not hold his Plavix.  Should he require surgery no change in therapy (other than holding the Plavix) would be indicated.    Corey Oneill  3:41 PM  11/12/2015

## 2015-11-12 NOTE — ED Notes (Signed)
Cards at bedside

## 2015-11-12 NOTE — ED Notes (Signed)
Attempted report 

## 2015-11-12 NOTE — H&P (Signed)
Reason for Consult: acute appendicitis Referring Physician: Dr. Theotis Burrow Cardiology: Dr. Burt Knack Urology: Dr. Junious Silk  HPI: Corey Oneill is a 79 year old male with a history of prostate cancer s/p radiation, HLD, CAD s/p DES to RCA 06/18/15, aortic stenosis he presents today with a 3 day history of abdominal pain. Initially mild and intermittent. Location was in the periumbilical region. Localized to the RLQ last night and worsened. He was seen by Dr. Burt Knack yesterday, but symptoms were not bothersome enough to mention. Associated with chills, sweats and nausea. Denies vomiting, diarrhea. Endorses BRBPR that has been ongoing and has a referral to Dr. Earlean Shawl for a colonoscopy. Also has a referral to Dr. [name] for the aortic stenosis.  Last oral intake was last night. Appetite has been poor. Reports decreased urine output and hematuria since starting plavix in August.  Work up shows normal white count. Normotensive and afebrile. CT of abdomen and pelvis reveals appendicitis. We have therefore been asked to evaluate.     Past Medical History  Diagnosis Date  . Hyperlipidemia   . Esophageal reflux   . Prostate cancer ([place])   . Hx of radiation therapy 04/14/13-06/09/13    prostate 7800 cGy, 40 sessions, seminal vesicles 5600 cGy 40 sessions  . CAD (coronary artery disease)     a.  LHC 8/16: Mid to distal LAD 30%, OM1 40%, proximal mid RCA 40%, distal RCA 60% >> FFR 0.69  >> PCI: 3 x 15 mm Resolute DES  . Aortic stenosis     a. peak to peak gradient by LHC 8/16:  37 mmHg (moderately severe)   . Radiation proctitis     Past Surgical History  Procedure Laterality Date  . Nose surgery    . Tonsillectomy    . Needle biopsy of prostate    . Colonoscopy    . Cardiac catheterization N/A 04/21/2015    Procedure: Right/Left Heart Cath and Coronary Angiography;  Surgeon: [name], MD;  Location: [place] [employer] [facility];  Service: Cardiovascular;  Laterality: N/A;  . Cardiac  catheterization N/A 06/18/2015    Procedure: Intravascular Pressure Wire/FFR Study;  Surgeon: [name], MD;  Location: [place] [facility];  Service: Cardiovascular;  Laterality: N/A;  . Cardiac catheterization N/A 06/18/2015    Procedure: Coronary Stent Intervention;  Surgeon: [name], MD;  Location: [place] [facility];  Service: Cardiovascular;  Laterality: N/A;  . Cardiac catheterization N/A 06/18/2015    Procedure: Right/Left Heart Cath and Coronary Angiography;  Surgeon: [name], MD;  Location: [place] [facility];  Service: Cardiovascular;  Laterality: N/A;  . Cardiac catheterization N/A 06/23/2015    Procedure: Left Heart Cath and Cors/Grafts Angiography;  Surgeon: [name], MD;  Location: [place] [facility];  Service: Cardiovascular;  Laterality: N/A;    Family History  Problem Relation Age of Onset  . Brain cancer Father   . Depression Daughter   . Lymphoma Mother    Social History:  reports that he quit smoking about 47 years ago. He has never used smokeless tobacco. He reports that he drinks about 7.5 oz of alcohol per week. He reports that he does not use illicit drugs.  Allergies: No Known Allergies   (Not in a hospital admission)  Results for orders placed or performed during the hospital encounter of 11/12/15 (from the past 48 hour(s))  Lipase, blood     Status: None   Collection Time: 11/12/15  8:06 AM  Result Value Ref  Range   Lipase 20 11 - 51 U/L  Comprehensive metabolic panel     Status: Abnormal   Collection Time: 11/12/15  8:06 AM  Result Value Ref Range   Sodium 133 (L) 135 - 145 mmol/L   Potassium 4.4 3.5 - 5.1 mmol/L   Chloride 99 (L) 101 - 111 mmol/L   CO2 26 22 - 32 mmol/L   Glucose, Bld 108 (H) 65 - 99 mg/dL   BUN 11 6 - 20 mg/dL   Creatinine, Ser 0.83 0.61 - 1.24 mg/dL   Calcium 8.8 (L) 8.9 - 10.3 mg/dL   Total Protein 6.7 6.5 - 8.1 g/dL   Albumin 3.5 3.5 - 5.0 g/dL   AST 21 15 - 41 U/L   ALT 14 (L) 17 - 63 U/L   Alkaline  Phosphatase 83 38 - 126 U/L   Total Bilirubin 0.7 0.3 - 1.2 mg/dL   GFR calc non Af Amer >60 >60 mL/min   GFR calc Af Amer >60 >60 mL/min    Comment: (NOTE) The eGFR has been calculated using the CKD EPI equation. This calculation has not been validated in all clinical situations. eGFR's persistently <60 mL/min signify possible Chronic Kidney Disease.    Anion gap 8 5 - 15  CBC     Status: None   Collection Time: 11/12/15  8:06 AM  Result Value Ref Range   WBC 9.7 4.0 - 10.5 K/uL   RBC 4.59 4.22 - 5.81 MIL/uL   Hemoglobin 13.9 13.0 - 17.0 g/dL   HCT 41.1 39.0 - 52.0 %   MCV 89.5 78.0 - 100.0 fL   MCH 30.3 26.0 - 34.0 pg   MCHC 33.8 30.0 - 36.0 g/dL   RDW 14.5 11.5 - 15.5 %   Platelets 243 150 - 400 K/uL  Urinalysis, Routine w reflex microscopic (not at [facility])     Status: Abnormal   Collection Time: 11/12/15  8:06 AM  Result Value Ref Range   Color, Urine YELLOW YELLOW   APPearance CLOUDY (A) CLEAR   Specific Gravity, Urine 1.022 1.005 - 1.030   pH 7.5 5.0 - 8.0   Glucose, UA NEGATIVE NEGATIVE mg/dL   Hgb urine dipstick MODERATE (A) NEGATIVE   Bilirubin Urine NEGATIVE NEGATIVE   Ketones, ur 15 (A) NEGATIVE mg/dL   Protein, ur 30 (A) NEGATIVE mg/dL   Nitrite NEGATIVE NEGATIVE   Leukocytes, UA TRACE (A) NEGATIVE  Urine microscopic-add on     Status: Abnormal   Collection Time: 11/12/15  8:06 AM  Result Value Ref Range   Squamous Epithelial / LPF 0-5 (A) NONE SEEN   WBC, UA 0-5 0 - 5 WBC/hpf   RBC / HPF 6-30 0 - 5 RBC/hpf   Bacteria, UA FEW (A) NONE SEEN   Casts GRANULAR CAST (A) NEGATIVE   Urine-Other MUCOUS PRESENT     Comment: AMORPHOUS URATES/PHOSPHATES  I-stat troponin, ED     Status: None   Collection Time: 11/12/15  8:44 AM  Result Value Ref Range   Troponin i, poc 0.00 0.00 - 0.08 ng/mL   Comment 3            Comment: Due to the release kinetics of cTnI, a negative result within the first hours of the onset of symptoms does not rule out myocardial infarction  with certainty. If myocardial infarction is still suspected, repeat the test at appropriate intervals.    Ct Abdomen Pelvis W Contrast  11/12/2015  CLINICAL DATA:  Right lower quadrant  pain increasing over the past 4 days, initial encounter EXAM: CT ABDOMEN AND PELVIS WITH CONTRAST TECHNIQUE: Multidetector CT imaging of the abdomen and pelvis was performed using the standard protocol following bolus administration of intravenous contrast. CONTRAST:  13m OMNIPAQUE IOHEXOL 300 MG/ML  SOLN COMPARISON:  07/14/2015 FINDINGS: Lung bases are free of acute infiltrate or sizable effusion. Multiple calcified hilar lymph nodes are noted consistent prior granulomatous disease. Mild coronary calcifications are seen. The liver, gallbladder, spleen, adrenal glands and pancreas are normal in their CT appearance with the exception of a solitary cyst in the liver on image number 48 of series 2. Cystic changes are noted within the kidneys bilaterally. No renal calculi or obstructive changes are noted. The right lower quadrant a considerable amount of inflammatory change is noted. There are 2 appendicoliths identified within a dilated and inflamed appendix consistent with ascites. Inflammatory change extends into the cecum. No perforation or abscess is identified at this time. The bony structures show mild degenerative change of the lumbar spine. IMPRESSION: Changes consistent with appendicitis without perforation. Changes of prior granulomatous disease. Electronically Signed   By: MInez CatalinaM.D.   On: 11/12/2015 11:00    Review of Systems  Constitutional: Positive for chills and diaphoresis. Negative for fever, weight loss and malaise/fatigue.  Eyes: Negative for blurred vision, double vision, photophobia, pain, discharge and redness.  Respiratory: Negative for cough, hemoptysis, sputum production, shortness of breath and wheezing.   Cardiovascular: Positive for chest pain. Negative for palpitations, orthopnea,  claudication, leg swelling and PND.  Gastrointestinal: Positive for nausea, abdominal pain, constipation and blood in stool. Negative for heartburn, vomiting and diarrhea.  Genitourinary: Positive for dysuria and hematuria. Negative for urgency, frequency and flank pain.  Musculoskeletal: Positive for back pain. Negative for myalgias, joint pain, falls and neck pain.  Neurological: Negative for dizziness, tingling, tremors, sensory change, speech change, focal weakness, seizures, loss of consciousness, weakness and headaches.  Psychiatric/Behavioral: Negative for depression, suicidal ideas, hallucinations, memory loss and substance abuse. The patient is not nervous/anxious and does not have insomnia.     Blood pressure 115/65, pulse 102, temperature 97.5 F (36.4 C), temperature source Axillary, resp. rate 17, height 6' (1.829 m), weight 64.411 kg (142 lb), SpO2 98 %. Physical Exam  Constitutional: He is oriented to person, place, and time. He appears well-developed and well-nourished. No distress.  HENT:  Head: Normocephalic and atraumatic.  Mouth/Throat: No oropharyngeal exudate.  Cardiovascular: Normal rate, regular rhythm and intact distal pulses.  Exam reveals no gallop and no friction rub.   Murmur heard. Respiratory: Effort normal and breath sounds normal. No respiratory distress. He has no wheezes. He has no rales. He exhibits no tenderness.  GI: Soft. Bowel sounds are normal. He exhibits no distension and no mass. There is no rebound and no guarding.  TTP RLQ  Musculoskeletal: Normal range of motion. He exhibits no edema or tenderness.  Neurological: He is alert and oriented to person, place, and time.  Skin: Skin is warm and dry. No rash noted. He is not diaphoretic. No erythema. No pallor.  Psychiatric: He has a normal mood and affect. His behavior is normal. Judgment and thought content normal.     Assessment/Plan Acute appendicitis-does not appear perforated, clinically stable  at this time.Given his medical problems including recent stent and plavix use, well attempt at non operative management with bowel rest and antibiotics.  Consult to cardiology for surgical risk stratification.   CAD-s/p DES August 2016. On plavix. I suspect  he will have to remain on the plavix.  Last dose 12/29 Severe Aortic Stenosis-to see Dr. [name] next Wednesday Hematuria-urine culture. Chronic issue.  Sees Dr. Junious Silk for the problem BRBPR-H&H stable. Colonoscopy pending  FEN-NPO, gentle IVF, pain control VTE prophylaxis--SCD/lovenox Dispo-tele   Erby Pian ANP-BC Pager [phone] 11/12/2015, 12:29 PM

## 2015-11-12 NOTE — ED Provider Notes (Signed)
CSN: [id number]     Arrival date & time 11/12/15  [id number] History   First MD Initiated Contact with Patient 11/12/15 [phone]     Chief Complaint  Patient presents with  . Testicle Pain  . Abdominal Pain    HPI   LUAL BELAIRE is a 79 y.o. male with a PMH of HLD, aortic stenosis, CAD, prostate cancer s/p radiation who presents to the ED with abdominal pain radiating to his right testicle x 3 days. He states his symptoms are constant. He reports his pain has moved from his epigastric region to his right lower abdomen. He denies exacerbating or alleviating factors. He reports associated nausea. He denies fever, chills, chest pain, shortness of breath, vomiting, diarrhea, constipation, dysuria, urgency, frequency, penile discharge, scrotal swelling. He states he has a history of radiation proctitis, and is considering undergoing hyperbaric therapy for his symptoms. Of note, he states he experienced discomfort around his lower ribs and subsequently underwent cardiac catheterization and PCI to distal RCA in August 2016. He states he was seen by Dr. Burt Knack yesterday, and is supposed to meet with a CT surgeon regarding further evaluation and management of his aortic stenosis.    PCP: Dr. Redmond School  Past Medical History  Diagnosis Date  . Hyperlipidemia   . Esophageal reflux   . Prostate cancer ([place])   . Hx of radiation therapy 04/14/13-06/09/13    prostate 7800 cGy, 40 sessions, seminal vesicles 5600 cGy 40 sessions  . CAD (coronary artery disease)     a.  LHC 8/16: Mid to distal LAD 30%, OM1 40%, proximal mid RCA 40%, distal RCA 60% >> FFR 0.69  >> PCI: 3 x 15 mm Resolute DES  . Aortic stenosis     a. peak to peak gradient by LHC 8/16:  37 mmHg (moderately severe)   . Radiation proctitis    Past Surgical History  Procedure Laterality Date  . Nose surgery    . Tonsillectomy    . Needle biopsy of prostate    . Colonoscopy    . Prostate surgery    . Cardiac catheterization N/A 04/21/2015    Procedure:  Right/Left Heart Cath and Coronary Angiography;  Surgeon: [name], MD;  Location: [facility];  Service: Cardiovascular;  Laterality: N/A;  . Cardiac catheterization N/A 06/18/2015    Procedure: Intravascular Pressure Wire/FFR Study;  Surgeon: [name], MD;  Location: [facility];  Service: Cardiovascular;  Laterality: N/A;  . Cardiac catheterization N/A 06/18/2015    Procedure: Coronary Stent Intervention;  Surgeon: [name], MD;  Location: [facility];  Service: Cardiovascular;  Laterality: N/A;  . Cardiac catheterization N/A 06/18/2015    Procedure: Right/Left Heart Cath and Coronary Angiography;  Surgeon: [name], MD;  Location: [facility];  Service: Cardiovascular;  Laterality: N/A;  . Cardiac catheterization N/A 06/23/2015    Procedure: Left Heart Cath and Cors/Grafts Angiography;  Surgeon: [name], MD;  Location: [facility];  Service: Cardiovascular;  Laterality: N/A;   Family History  Problem Relation Age of Onset  . Brain cancer Father   . Depression Daughter   . Lymphoma Mother    Social History  Substance Use Topics  . Smoking status: Former Smoker    Quit date: 03/05/1968  . Smokeless tobacco: Never Used  . Alcohol Use: 7.5 oz/week    15 Standard drinks or equivalent per week     Comment: rare  Review of Systems  Constitutional: Negative for fever and chills.  Respiratory: Negative for shortness of breath.   Cardiovascular: Negative for chest pain.  Gastrointestinal: Positive for nausea and abdominal pain. Negative for vomiting, diarrhea and constipation.  Genitourinary: Positive for testicular pain. Negative for dysuria, urgency, frequency, discharge and scrotal swelling.      Allergies  Review of patient's allergies indicates no known allergies.  Home Medications   Prior to Admission medications   Medication Sig Start Date End Date Taking? Authorizing Provider  acetaminophen (TYLENOL) 500 MG  tablet Take 500 mg by mouth daily as needed (pain).   Yes Historical Provider, MD  aspirin EC 81 MG tablet Take 81 mg by mouth daily.   Yes Historical Provider, MD  buPROPion (WELLBUTRIN SR) 100 MG 12 hr tablet Take 100 mg by mouth daily.    Yes Historical Provider, MD  clopidogrel (PLAVIX) 75 MG tablet Take 1 tablet (75 mg total) by mouth daily. 07/26/15  Yes Burtis Junes, NP  ferrous fumarate (HEMOCYTE - 106 MG FE) 325 (106 FE) MG TABS tablet Take 1 tablet (106 mg of iron total) by mouth daily. 07/26/15  Yes Burtis Junes, NP  FLUoxetine (PROZAC) 10 MG capsule Take 1 capsule (10 mg total) by mouth daily. 03/22/15  Yes [name], MD  Magnesium 250 MG TABS Take 250 mg by mouth daily.    Yes Historical Provider, MD  nitroGLYCERIN (NITROSTAT) 0.4 MG SL tablet Place 0.4 mg under the tongue every 5 (five) minutes as needed for chest pain.   Yes Historical Provider, MD  rosuvastatin (CRESTOR) 10 MG tablet Take 1 tablet (10 mg total) by mouth daily. 06/29/15  Yes [name], MD    BP 105/65 mmHg  Pulse 86  Temp(Src) 97.5 F (36.4 C) (Axillary)  Resp 16  Ht 6' (1.829 m)  Wt 64.411 kg  BMI 19.25 kg/m2  SpO2 98% Physical Exam  Constitutional: He is oriented to person, place, and time. He appears well-developed and well-nourished. No distress.  HENT:  Head: Normocephalic and atraumatic.  Right Ear: External ear normal.  Left Ear: External ear normal.  Nose: Nose normal.  Mouth/Throat: Uvula is midline, oropharynx is clear and moist and mucous membranes are normal.  Eyes: Conjunctivae, EOM and lids are normal. Pupils are equal, round, and reactive to light. Right eye exhibits no discharge. Left eye exhibits no discharge. No scleral icterus.  Neck: Normal range of motion. Neck supple.  Cardiovascular: Normal rate, regular rhythm, normal heart sounds, intact distal pulses and normal pulses.   Pulmonary/Chest: Effort normal and breath sounds normal. No respiratory distress. He has no  wheezes. He has no rales.  Abdominal: Soft. Normal appearance and bowel sounds are normal. He exhibits no distension and no mass. There is tenderness. There is no rigidity, no rebound and no guarding. Hernia confirmed negative in the right inguinal area and confirmed negative in the left inguinal area.  TTP of epigastrium and RLQ. No rebound, guarding, or masses.  Genitourinary: Penis normal. Right testis shows tenderness. Right testis shows no mass and no swelling. Left testis shows no mass, no swelling and no tenderness.  Musculoskeletal: Normal range of motion. He exhibits no edema or tenderness.  Neurological: He is alert and oriented to person, place, and time.  Skin: Skin is warm, dry and intact. No rash noted. He is not diaphoretic. No erythema. No pallor.  Psychiatric: He has a normal mood and affect. His speech is normal and behavior is  normal.  Nursing note and vitals reviewed.   ED Course  Procedures (including critical care time)  Labs Review Labs Reviewed  COMPREHENSIVE METABOLIC PANEL - Abnormal; Notable for the following:    Sodium 133 (*)    Chloride 99 (*)    Glucose, Bld 108 (*)    Calcium 8.8 (*)    ALT 14 (*)    All other components within normal limits  URINALYSIS, ROUTINE W REFLEX MICROSCOPIC (NOT AT [facility]) - Abnormal; Notable for the following:    APPearance CLOUDY (*)    Hgb urine dipstick MODERATE (*)    Ketones, ur 15 (*)    Protein, ur 30 (*)    Leukocytes, UA TRACE (*)    All other components within normal limits  URINE MICROSCOPIC-ADD ON - Abnormal; Notable for the following:    Squamous Epithelial / LPF 0-5 (*)    Bacteria, UA FEW (*)    Casts GRANULAR CAST (*)    All other components within normal limits  URINE CULTURE  LIPASE, BLOOD  CBC  I-STAT TROPOININ, ED    Imaging Review No results found.   I have personally reviewed and evaluated these images and lab results as part of my medical decision-making.   EKG Interpretation   Date/Time:   Friday November 12 2015 07:47:45 EST Ventricular Rate:  91 PR Interval:  171 QRS Duration: 94 QT Interval:  383 QTC Calculation: 471 R Axis:   84 Text Interpretation:  Sinus rhythm Biatrial enlargement Borderline right  axis deviation No significant change since last tracing Confirmed by  LITTLE MD, RACHEL [phone]) on 11/12/2015 7:51:15 AM      MDM   Final diagnoses:  Right lower quadrant abdominal pain  Acute appendicitis, unspecified acute appendicitis type    79 year old male presents with abdominal pain x 3 days, which he states started to radiate to his right testicle today prior to arrival. Reports associated nausea. Denies fever, chills, chest pain, shortness of breath, vomiting, diarrhea, constipation, dysuria, urgency, frequency, penile discharge, scrotal swelling.  Patient is afebrile. Vital signs stable. Heart RRR. Lungs clear to auscultation bilaterally. Abdomen soft, non-distended, with TTP in epigastrium and RLQ. No rebound, guarding, or masses. Mild TTP of right posterior testicle surrounding epidiymis. No evidence of torsion on exam.   EKG sinus rhythm, HR 91. Troponin negative. CBC negative for leukocytosis or anemia. CMP unremarkable. Lipase within normal limits. UA with moderate hemoglobin and trace leukocytes. Urine microscopic with 0-5 WBCs, 6-30 RBCs, few bacteria.  Will obtain CT abdomen pelvis. CT remarkable for appendicitis without perforation.  Surgery consulted. Spoke with Will Creig Hines, PA-C, who advised surgery will see the patient and to admit to medicine. Recommended starting zosyn. Hospitalist consulted for admission. Spoke with Dr. Darrick Meigs, who advised he will not admit for appendicitis, and suggested consulting cardiology given patient's significant cardiac history. Cardiology consulted. Spoke with Wannetta Sender, who said they had already been consulted and that cardiology will see the patient. Called general surgery to let them know hospitalist will not admit.  Spoke with Will Creig Hines, PA-C, who said he will discuss with his attending.  Patient discussed with and seen by Dr. Rex Kras. Patient to be admitted for further evaluation and management.   BP 105/65 mmHg  Pulse 86  Temp(Src) 97.5 F (36.4 C) (Axillary)  Resp 16  Ht 6' (1.829 m)  Wt 64.411 kg  BMI 19.25 kg/m2  SpO2 98%     Marella Chimes, PA-C 11/12/15 [place],  MD 11/13/15 1506

## 2015-11-12 NOTE — Progress Notes (Addendum)
ANTIBIOTIC CONSULT NOTE - INITIAL  Pharmacy Consult for Zosyn Indication: intra-abdominal infection  No Known Allergies  Patient Measurements: Height: 6' (182.9 cm) Weight: 142 lb (64.411 kg) IBW/kg (Calculated) : 77.6  Vital Signs: Temp: 97.5 F (36.4 C) (12/30 0752) Temp Source: Axillary (12/30 0752) BP: 135/96 mmHg (12/30 1029) Pulse Rate: 93 (12/30 1029) Intake/Output from previous day:   Intake/Output from this shift:    Labs:  Recent Labs  11/12/15 0806  WBC 9.7  HGB 13.9  PLT 243  CREATININE 0.83   Estimated Creatinine Clearance: 67.9 mL/min (by C-G formula based on Cr of 0.83). No results for input(s): VANCOTROUGH, VANCOPEAK, VANCORANDOM, GENTTROUGH, GENTPEAK, GENTRANDOM, TOBRATROUGH, TOBRAPEAK, TOBRARND, AMIKACINPEAK, AMIKACINTROU, AMIKACIN in the last 72 hours.   Microbiology: No results found for this or any previous visit (from the past 720 hour(s)).  Medical History: Past Medical History  Diagnosis Date  . Hyperlipidemia   . Esophageal reflux   . Prostate cancer ([place])   . Hx of radiation therapy 04/14/13-06/09/13    prostate 7800 cGy, 40 sessions, seminal vesicles 5600 cGy 40 sessions  . CAD (coronary artery disease)     a.  LHC 8/16: Mid to distal LAD 30%, OM1 40%, proximal mid RCA 40%, distal RCA 60% >> FFR 0.69  >> PCI: 3 x 15 mm Resolute DES  . Aortic stenosis     a. peak to peak gradient by LHC 8/16:  37 mmHg (moderately severe)   . Radiation proctitis     Assessment: 79 yo M presents to ED on 12/30 with c/o worsening abdominal pain and R testicle pain x3 days. Pharmacy consulted to start Zosyn for intra-abdominal infection.   Patient is afebrile, WBC wnl, SCr 0.83, CrCl ~ 65 ml/min.  Goal of Therapy:  Eradication of infection  Plan:  - Start Zosyn 3.375 g IV (30 min infusion) x1, followed by Zosyn 3.375 g IV q8h (4hr infusion) - Monitor C&S, vitals and clinical progression  Dimitri Ped, PharmD. PGY-1 Pharmacy Resident Pager:  [phone]  11/12/2015,11:29 AM   ===============   Addendum: pharmacy will sign off and follow peripherally.  Thank you for the consult!  Issiah Huffaker D. Mina Marble, PharmD, BCPS Pager:  [phone] 11/13/2015, 10:40 AM

## 2015-11-12 NOTE — ED Notes (Signed)
Pt. Coming from home c/o R testicle pain and worsening abd. Pain x3 days. Pt. Is a pt. Of Dr. Burt Knack (cards) for aortic stenosis. Was seen in his office yesterday, but did not mention the pain to him. Pt. Denies issues with testicle or abd. Pain in the pasts. Pt. sts he has had loss of appetite since this pain has started.

## 2015-11-12 NOTE — ED Notes (Signed)
General Surgery at bedside.

## 2015-11-13 DIAGNOSIS — K3589 Other acute appendicitis: Secondary | ICD-10-CM

## 2015-11-13 DIAGNOSIS — I350 Nonrheumatic aortic (valve) stenosis: Secondary | ICD-10-CM

## 2015-11-13 LAB — CBC
HEMATOCRIT: 34.9 % — AB (ref 39.0–52.0)
HEMOGLOBIN: 11.6 g/dL — AB (ref 13.0–17.0)
MCH: 30 pg (ref 26.0–34.0)
MCHC: 33.2 g/dL (ref 30.0–36.0)
MCV: 90.2 fL (ref 78.0–100.0)
Platelets: 217 10*3/uL (ref 150–400)
RBC: 3.87 MIL/uL — ABNORMAL LOW (ref 4.22–5.81)
RDW: 14.9 % (ref 11.5–15.5)
WBC: 12.2 10*3/uL — ABNORMAL HIGH (ref 4.0–10.5)

## 2015-11-13 LAB — BASIC METABOLIC PANEL
ANION GAP: 6 (ref 5–15)
BUN: 17 mg/dL (ref 6–20)
CO2: 28 mmol/L (ref 22–32)
Calcium: 8.2 mg/dL — ABNORMAL LOW (ref 8.9–10.3)
Chloride: 98 mmol/L — ABNORMAL LOW (ref 101–111)
Creatinine, Ser: 1.16 mg/dL (ref 0.61–1.24)
GFR calc Af Amer: >60 mL/min (ref >60)
GFR, EST NON AFRICAN AMERICAN: 59 mL/min — AB (ref >60)
GLUCOSE: 119 mg/dL — AB (ref 65–99)
POTASSIUM: 4.1 mmol/L (ref 3.5–5.1)
Sodium: 132 mmol/L — ABNORMAL LOW (ref 135–145)

## 2015-11-13 MED ORDER — BOOST PLUS PO LIQD
237.0000 mL | Freq: Three times a day (TID) | ORAL | Status: DC
  Administered 2015-11-13 – 2015-11-21 (×12): 237 mL via ORAL
  Filled 2015-11-13 (×30): qty 237

## 2015-11-13 NOTE — Progress Notes (Signed)
Subjective: Alert and oriented.  Does not appear to be in any distress.  Denies nausea.  Voiding okay. Says he still has lower abdominal pain on the right side but better than yesterday.  No chills. Afebrile.  WBC up a little to 12,200.  Potassium 4.1.  Creatinine 1.16.  Glucose 119.  Objective: Vital signs in last 24 hours: Temp:  [97.7 F (36.5 C)-98.6 F (37 C)] 97.8 F (36.6 C) (12/31 0447) Pulse Rate:  [80-103] 80 (12/31 0447) Resp:  [14-23] 18 (12/31 0447) BP: (91-135)/(51-96) 117/63 mmHg (12/31 0447) SpO2:  [95 %-99 %] 97 % (12/31 0447) Last BM Date: 11/13/15  Intake/Output from previous day: 12/30 0701 - 12/31 0700 In: 120 [P.O.:120] Out: 270 [Urine:270] Intake/Output this shift:    General appearance: Alert and cooperative.  Mental status normal.  Very pleasant.  No distress. Resp: clear to auscultation bilaterally GI: Soft.  Not distended.  Tender right lower quadrant but no mass.  Lab Results:   Recent Labs  11/12/15 0806 11/13/15 0542  WBC 9.7 12.2*  HGB 13.9 11.6*  HCT 41.1 34.9*  PLT 243 217   BMET  Recent Labs  11/12/15 0806 11/13/15 0542  NA 133* 132*  K 4.4 4.1  CL 99* 98*  CO2 26 28  GLUCOSE 108* 119*  BUN 11 17  CREATININE 0.83 1.16  CALCIUM 8.8* 8.2*   PT/INR No results for input(s): LABPROT, INR in the last 72 hours. ABG No results for input(s): PHART, HCO3 in the last 72 hours.  Invalid input(s): PCO2, PO2  Studies/Results: Ct Abdomen Pelvis W Contrast  11/12/2015  CLINICAL DATA:  Right lower quadrant pain increasing over the past 4 days, initial encounter EXAM: CT ABDOMEN AND PELVIS WITH CONTRAST TECHNIQUE: Multidetector CT imaging of the abdomen and pelvis was performed using the standard protocol following bolus administration of intravenous contrast. CONTRAST:  77mL OMNIPAQUE IOHEXOL 300 MG/ML  SOLN COMPARISON:  07/14/2015 FINDINGS: Lung bases are free of acute infiltrate or sizable effusion. Multiple calcified hilar lymph  nodes are noted consistent prior granulomatous disease. Mild coronary calcifications are seen. The liver, gallbladder, spleen, adrenal glands and pancreas are normal in their CT appearance with the exception of a solitary cyst in the liver on image number 48 of series 2. Cystic changes are noted within the kidneys bilaterally. No renal calculi or obstructive changes are noted. The right lower quadrant a considerable amount of inflammatory change is noted. There are 2 appendicoliths identified within a dilated and inflamed appendix consistent with ascites. Inflammatory change extends into the cecum. No perforation or abscess is identified at this time. The bony structures show mild degenerative change of the lumbar spine. IMPRESSION: Changes consistent with appendicitis without perforation. Changes of prior granulomatous disease. Electronically Signed   By: Inez Catalina M.D.   On: 11/12/2015 11:00    Anti-infectives: Anti-infectives    Start     Dose/Rate Route Frequency Ordered Stop   11/12/15 1730  piperacillin-tazobactam (ZOSYN) IVPB 3.375 g     3.375 g 12.5 mL/hr over 240 Minutes Intravenous 3 times per day 11/12/15 1135     11/12/15 1145  piperacillin-tazobactam (ZOSYN) IVPB 3.375 g     3.375 g 100 mL/hr over 30 Minutes Intravenous  Once 11/12/15 1135 11/12/15 1226      Assessment/Plan:  Acute appendicitis without evidence of perforation or abscess, but with 2 fecaliths present Clinically he is stable and somewhat improved.  Will allow full liquids Leukocytosis noted.  Not sure whether this is  harbinger of progressive disease or not.  Repeat CBC tomorrow.  CAD-s/p DES August 2016. On plavix. I suspect he will have to remain on the plavix.I discussed overall plan with Dr. Percival Spanish this morning.  If appendicitis resolves, plan interval appendectomy an approximate 6 weeks.  Severe Aortic Stenosis-to see Dr. [name] next Wednesday.  Apparently aortic valve replacement is not urgent.    Hematuria-urine culture. Chronic issue. Sees Dr. Junious Silk for the problem BRBPR-H&H stable. Colonoscopy pending  FEN-NPO, gentle IVF, pain control VTE prophylaxis--SCD/lovenox Dispo-tele    LOS: 1 day    Dyanna Seiter M 11/13/2015

## 2015-11-13 NOTE — Progress Notes (Signed)
    SUBJECTIVE:  He was feeling better.   Had a good BM    PHYSICAL EXAM Filed Vitals:   11/12/15 2148 11/12/15 2236 11/13/15 0104 11/13/15 0447  BP: 92/52 96/55 95/54  117/63  Pulse:   84 80  Temp:   97.7 F (36.5 C) 97.8 F (36.6 C)  TempSrc:   Oral Oral  Resp:   18 18  Height:      Weight:      SpO2:   97% 97%    LABS:  Results for orders placed or performed during the hospital encounter of 11/12/15 (from the past 24 hour(s))  I-stat troponin, ED     Status: None   Collection Time: 11/12/15  8:44 AM  Result Value Ref Range   Troponin i, poc 0.00 0.00 - 0.08 ng/mL   Comment 3          Basic metabolic panel     Status: Abnormal   Collection Time: 11/13/15  5:42 AM  Result Value Ref Range   Sodium 132 (L) 135 - 145 mmol/L   Potassium 4.1 3.5 - 5.1 mmol/L   Chloride 98 (L) 101 - 111 mmol/L   CO2 28 22 - 32 mmol/L   Glucose, Bld 119 (H) 65 - 99 mg/dL   BUN 17 6 - 20 mg/dL   Creatinine, Ser 1.16 0.61 - 1.24 mg/dL   Calcium 8.2 (L) 8.9 - 10.3 mg/dL   GFR calc non Af Amer 59 (L) >60 mL/min   GFR calc Af Amer >60 >60 mL/min   Anion gap 6 5 - 15  CBC     Status: Abnormal   Collection Time: 11/13/15  5:42 AM  Result Value Ref Range   WBC 12.2 (H) 4.0 - 10.5 K/uL   RBC 3.87 (L) 4.22 - 5.81 MIL/uL   Hemoglobin 11.6 (L) 13.0 - 17.0 g/dL   HCT 34.9 (L) 39.0 - 52.0 %   MCV 90.2 78.0 - 100.0 fL   MCH 30.0 26.0 - 34.0 pg   MCHC 33.2 30.0 - 36.0 g/dL   RDW 14.9 11.5 - 15.5 %   Platelets 217 150 - 400 K/uL    Intake/Output Summary (Last 24 hours) at 11/13/15 [id number] Last data filed at 11/13/15 0106  Gross per 24 hour  Intake    120 ml  Output    270 ml  Net   -150 ml     ASSESSMENT AND PLAN:  Active Problems:   Acute appendicitis  He seems to be doing well.  I talked with surgery today.  They will likely be able to send him home but would plan to have an elective appendectomy in six to eight weeks.  For now continue current meds and we will arrange follow up to plan  for this elective surgery from a cardiac standpoint.     Minus Breeding 11/13/2015 8:33 AM

## 2015-11-13 NOTE — Progress Notes (Addendum)
Initial Nutrition Assessment  DOCUMENTATION CODES:   Severe malnutrition in context of chronic illness  INTERVENTION:   Boost Plus po TID, each supplement provides 360 kcal and 14 grams of protein  NUTRITION DIAGNOSIS:   Increased nutrient needs related to acute illness as evidenced by estimated needs  GOAL:   Patient will meet greater than or equal to 90% of their needs   MONITOR:   PO intake, Supplement acceptance, Labs, Weight trends, I & O's  REASON FOR ASSESSMENT:   Malnutrition Screening Tool  ASSESSMENT:   79 yo Male with a history of prostate cancer s/p radiation, HLD, CAD s/p DES to RCA 06/18/15, aortic stenosis he presents today with a 3 day history of abdominal pain. Initially mild and intermittent. Location was in the periumbilical region. Localized to the RLQ last night and worsened. He was seen by Dr. Burt Knack yesterday, but symptoms were not bothersome enough to mention.  Patient reports his appetite is practically "non-existent".  He tries to drink oral nutrition supplements at home.  He is experiencing some abdominal pain.  Dx is acute appendicitis.  Also endorses a 25 lb weight loss in the last year.  RD encouraged him to continue to drink Boost Plus which he enjoys; will order as well during hospitalization.   Nutrition-Focused physical exam completed. Findings are severe fat depletion, severe muscle depletion, and no edema.   Diet Order:  Diet full liquid Room service appropriate?: Yes; Fluid consistency:: Thin  Skin:  Reviewed, no issues  Last BM:  12/31  Height:   Ht Readings from Last 1 Encounters:  11/12/15 6' (1.829 m)    Weight:   Wt Readings from Last 1 Encounters:  11/12/15 142 lb (64.411 kg)    Ideal Body Weight:  81 kg  BMI:  Body mass index is 19.25 kg/(m^2).  Estimated Nutritional Needs:   Kcal:  1800-2000  Protein:  90-100 gm  Fluid:  1.8-2.0 L  EDUCATION NEEDS:   No education needs identified at this time  Corey Oneill, RD, LDN Pager #: [phone] After-Hours Pager #: [phone]

## 2015-11-14 DIAGNOSIS — N2889 Other specified disorders of kidney and ureter: Secondary | ICD-10-CM

## 2015-11-14 HISTORY — DX: Other specified disorders of kidney and ureter: N28.89

## 2015-11-14 LAB — CBC
HEMATOCRIT: 33.9 % — AB (ref 39.0–52.0)
HEMOGLOBIN: 11.5 g/dL — AB (ref 13.0–17.0)
MCH: 30.7 pg (ref 26.0–34.0)
MCHC: 33.9 g/dL (ref 30.0–36.0)
MCV: 90.6 fL (ref 78.0–100.0)
Platelets: 217 10*3/uL (ref 150–400)
RBC: 3.74 MIL/uL — AB (ref 4.22–5.81)
RDW: 14.7 % (ref 11.5–15.5)
WBC: 10.1 10*3/uL (ref 4.0–10.5)

## 2015-11-14 LAB — BASIC METABOLIC PANEL
ANION GAP: 7 (ref 5–15)
ANION GAP: 6 (ref 5–15)
BUN: 13 mg/dL (ref 6–20)
BUN: 10 mg/dL (ref 6–20)
CALCIUM: 8.2 mg/dL — AB (ref 8.9–10.3)
CALCIUM: 8.1 mg/dL — AB (ref 8.9–10.3)
CHLORIDE: 98 mmol/L — AB (ref 101–111)
CHLORIDE: 97 mmol/L — AB (ref 101–111)
CO2: 27 mmol/L (ref 22–32)
CO2: 27 mmol/L (ref 22–32)
Creatinine, Ser: 1.03 mg/dL (ref 0.61–1.24)
Creatinine, Ser: 0.86 mg/dL (ref 0.61–1.24)
GFR calc non Af Amer: >60 mL/min (ref >60)
GFR calc non Af Amer: >60 mL/min (ref >60)
Glucose, Bld: 110 mg/dL — ABNORMAL HIGH (ref 65–99)
Glucose, Bld: 96 mg/dL (ref 65–99)
Potassium: 3.5 mmol/L (ref 3.5–5.1)
Potassium: 4.1 mmol/L (ref 3.5–5.1)
Sodium: 132 mmol/L — ABNORMAL LOW (ref 135–145)
Sodium: 130 mmol/L — ABNORMAL LOW (ref 135–145)

## 2015-11-14 LAB — C DIFFICILE QUICK SCREEN W PCR REFLEX
C DIFFICILE (CDIFF) INTERP: NEGATIVE
C DIFFICILE (CDIFF) TOXIN: NEGATIVE
C Diff antigen: NEGATIVE

## 2015-11-14 LAB — URINE CULTURE: Culture: 30000

## 2015-11-14 MED ORDER — SODIUM CHLORIDE 0.9 % IV SOLN
INTRAVENOUS | Status: DC
  Administered 2015-11-14 – 2015-11-16 (×5): via INTRAVENOUS
  Filled 2015-11-14 (×11): qty 1000

## 2015-11-14 MED ORDER — SACCHAROMYCES BOULARDII 250 MG PO CAPS
250.0000 mg | ORAL_CAPSULE | Freq: Three times a day (TID) | ORAL | Status: DC
  Administered 2015-11-14 – 2015-11-21 (×19): 250 mg via ORAL
  Filled 2015-11-14 (×20): qty 1

## 2015-11-14 NOTE — Progress Notes (Signed)
Pt refused to ambulate this shift.

## 2015-11-14 NOTE — Progress Notes (Signed)
Subjective: Right lower quadrant pain is better, but he now complains of 4-5 loose stools and more diffuse abdominal discomfort.  Tolerating liquid diet.  Would like a little bit more to eat.  Denies nausea.  Afebrile.  WBC back down to 10,700.  Creatinine 1.03.  Objective: Vital signs in last 24 hours: Temp:  [98.2 F (36.8 C)-98.6 F (37 C)] 98.6 F (37 C) (01/01 0552) Pulse Rate:  [83-88] 88 (01/01 0552) Resp:  [18] 18 (01/01 0552) BP: (106-107)/(58-62) 107/61 mmHg (01/01 0552) SpO2:  [96 %-100 %] 96 % (01/01 0552) Last BM Date: 11/14/15  Intake/Output from previous day: 12/31 0701 - 01/01 0700 In: 3731.7 [P.O.:1680; I.V.:1751.7; IV Piggyback:300] Out: 400 [Urine:400] Intake/Output this shift:    General appearance: uncooperative and Alert.  Elderly and a little bit feeble but in no physical distress.  Oriented. Resp: clear to auscultation bilaterally GI: Abdomen is basically soft.  No focal or localized tenderness.  No mass.  Lab Results:   Recent Labs  11/13/15 0542 11/14/15 0425  WBC 12.2* 10.1  HGB 11.6* 11.5*  HCT 34.9* 33.9*  PLT 217 217   BMET  Recent Labs  11/13/15 0542 11/14/15 0425  NA 132* 132*  K 4.1 3.5  CL 98* 98*  CO2 28 27  GLUCOSE 119* 110*  BUN 17 13  CREATININE 1.16 1.03  CALCIUM 8.2* 8.2*   PT/INR No results for input(s): LABPROT, INR in the last 72 hours. ABG No results for input(s): PHART, HCO3 in the last 72 hours.  Invalid input(s): PCO2, PO2  Studies/Results: Ct Abdomen Pelvis W Contrast  11/12/2015  CLINICAL DATA:  Right lower quadrant pain increasing over the past 4 days, initial encounter EXAM: CT ABDOMEN AND PELVIS WITH CONTRAST TECHNIQUE: Multidetector CT imaging of the abdomen and pelvis was performed using the standard protocol following bolus administration of intravenous contrast. CONTRAST:  22mL OMNIPAQUE IOHEXOL 300 MG/ML  SOLN COMPARISON:  07/14/2015 FINDINGS: Lung bases are free of acute infiltrate or sizable  effusion. Multiple calcified hilar lymph nodes are noted consistent prior granulomatous disease. Mild coronary calcifications are seen. The liver, gallbladder, spleen, adrenal glands and pancreas are normal in their CT appearance with the exception of a solitary cyst in the liver on image number 48 of series 2. Cystic changes are noted within the kidneys bilaterally. No renal calculi or obstructive changes are noted. The right lower quadrant a considerable amount of inflammatory change is noted. There are 2 appendicoliths identified within a dilated and inflamed appendix consistent with ascites. Inflammatory change extends into the cecum. No perforation or abscess is identified at this time. The bony structures show mild degenerative change of the lumbar spine. IMPRESSION: Changes consistent with appendicitis without perforation. Changes of prior granulomatous disease. Electronically Signed   By: Inez Catalina M.D.   On: 11/12/2015 11:00    Anti-infectives: Anti-infectives    Start     Dose/Rate Route Frequency Ordered Stop   11/12/15 1730  piperacillin-tazobactam (ZOSYN) IVPB 3.375 g     3.375 g 12.5 mL/hr over 240 Minutes Intravenous 3 times per day 11/12/15 1135     11/12/15 1145  piperacillin-tazobactam (ZOSYN) IVPB 3.375 g     3.375 g 100 mL/hr over 30 Minutes Intravenous  Once 11/12/15 1135 11/12/15 1226      Assessment/Plan:  Acute appendicitis without evidence of perforation or abscess, but with 2 fecaliths present Clinically he is stable and somewhat improved. Will allow soft diet Leukocytosis better   New onset diarrhea and  diffuse abdominal discomfort.  This may be secondary to antibiotics. We'll start on probiotics. I have increased his IV fluids. We'll check stool for C. difficile PCR I am going to hold his Plavix today, in case he deteriorates and need surgery tomorrow. If he deteriorates he will need to repeat CT scan and/or proceed with laparoscopic appendectomy.  CAD-s/p  DES August 2016. On plavix. I suspect he will have to remain on the plavix.I discussed overall plan with Dr. Percival Spanish this morning. If appendicitis resolves, plan interval appendectomy an approximate 6 weeks.  Severe Aortic Stenosis-to see Dr. [name] next Wednesday. Apparently aortic valve replacement is not urgent.  Hematuria-urine culture. Chronic issue. Sees Dr. Junious Silk for the problem BRBPR-H&H stable. Colonoscopy pending  FEN-diet as tol.   , gentle IVF, pain control VTE prophylaxis--SCD/lovenox Dispo-tele   LOS: 2 days    Corey Oneill M 11/14/2015

## 2015-11-15 ENCOUNTER — Encounter (HOSPITAL_COMMUNITY): Payer: Self-pay | Admitting: Radiology

## 2015-11-15 ENCOUNTER — Inpatient Hospital Stay (HOSPITAL_COMMUNITY): Payer: Medicare Other

## 2015-11-15 LAB — CBC
HCT: 33.4 % — ABNORMAL LOW (ref 39.0–52.0)
HEMOGLOBIN: 11.1 g/dL — AB (ref 13.0–17.0)
MCH: 29.7 pg (ref 26.0–34.0)
MCHC: 33.2 g/dL (ref 30.0–36.0)
MCV: 89.3 fL (ref 78.0–100.0)
Platelets: 237 10*3/uL (ref 150–400)
RBC: 3.74 MIL/uL — ABNORMAL LOW (ref 4.22–5.81)
RDW: 14.6 % (ref 11.5–15.5)
WBC: 8.9 10*3/uL (ref 4.0–10.5)

## 2015-11-15 MED ORDER — IOHEXOL 300 MG/ML  SOLN
100.0000 mL | Freq: Once | INTRAMUSCULAR | Status: AC | PRN
  Administered 2015-11-15: 100 mL via INTRAVENOUS

## 2015-11-15 MED ORDER — IOHEXOL 300 MG/ML  SOLN
25.0000 mL | INTRAMUSCULAR | Status: AC
  Administered 2015-11-15 (×2): 25 mL via ORAL

## 2015-11-15 NOTE — Progress Notes (Signed)
    Subjective:  C/o diarrhea. No chest pain or dyspnea.   Objective:  Vital Signs in the last 24 hours: Temp:  [97.6 F (36.4 C)-98 F (36.7 C)] 98 F (36.7 C) (01/02 0516) Pulse Rate:  [84-90] 90 (01/02 0516) Resp:  [16-17] 16 (01/02 0516) BP: (115-134)/(61-77) 134/77 mmHg (01/02 0516) SpO2:  [96 %-99 %] 96 % (01/02 0516)  Intake/Output from previous day: 01/01 0701 - 01/02 0700 In: 3066.7 [P.O.:1200; I.V.:1716.7; IV Piggyback:150] Out: -   Physical Exam: Pt is alert and oriented, NAD HEENT: normal Neck: JVP - normal Lungs: CTA bilaterally CV: RRR with 2/6 systolic murmur at the RUSB Abd: soft, mild tenderness, Positive BS Ext: no C/C/E Skin: warm/dry no rash   Lab Results:  Recent Labs  11/14/15 0425 11/15/15 0034  WBC 10.1 8.9  HGB 11.5* 11.1*  PLT 217 237    Recent Labs  11/14/15 0425 11/14/15 1134  NA 132* 130*  K 3.5 4.1  CL 98* 97*  CO2 27 27  GLUCOSE 110* 96  BUN 13 10  CREATININE 1.03 0.86   No results for input(s): TROPONINI in the last 72 hours.  Invalid input(s): CK, MB  Assessment/Plan:  Pt with acute appendicitis. Discussed his case with Dr Donne Hazel and Dr Dalbert Batman this morning. They are considering options of delayed surgery after a course of antibiotics as long as clinical improvement versus more urgent surgery if persistent symptoms/signs of ongoing infection.   He is well-known to me and followed for CAD and aortic stenosis. His stent was implanted 5 months ago under elective circumstances. With a newer generation DES, implanted electively, while not ideal it is reasonable to hold plavix and proceed with surgery if necessary. He can be continued on ASA and his risk of perioperative stent thrombosis is low. With respect to aortic stenosis, he has been referred for elective surgical consultation which has not occurred yet but I also think this can be managed perioperatively as he does not have critical AS and symptoms are relatively  mild.  Will be available if any cardiac issues arise. Gaynell Face, M.D. 11/15/2015, 10:01 AM

## 2015-11-15 NOTE — Progress Notes (Signed)
Patient ID: Corey Oneill, male   DOB: 07-23-1936, 80 y.o.   MRN: [id number] Ct no better and maybe more inflamed, I think likely just needs to go to or now. Discussed with Dr Burt Knack,  Will leave off plavix and make npo after mn.

## 2015-11-15 NOTE — Progress Notes (Signed)
Pt. States chest pain relieved by nitroglycerin tablet.

## 2015-11-15 NOTE — Care Management Important Message (Signed)
Important Message  Patient Details  Name: DEMETREUS SARVIS MRN: [id number] Date of Birth: [birth year]   Medicare Important Message Given:  Yes    Louanne Belton 11/15/2015, 1:14 PMImportant Message  Patient Details  Name: DALMER DOLINSKI MRN: [id number] Date of Birth: 09-10-1936   Medicare Important Message Given:  Yes    Maui Britten G 11/15/2015, 1:14 PM

## 2015-11-15 NOTE — Progress Notes (Signed)
Subjective: Diarrhea still a problem.  Lots of urgency and having to wear diapers.  This is apparently a chronic problem as outpatient.  He has alternating diarrhea and solid stools.  He's been evaluated by Dr. Earlean Shawl and no specific explanation for this has been given.  He's had colonoscopies.  Irritable bowel syndrome may be the problem.  Still has lower abdominal pain but not severe. Ambulating in halls. Tolerating diet fairly well. Afebrile.  Stool for C. difficile screen and PCR is negative. WBC 8900,  Now normal again.  Hemoglobin 11.1.  Stable.   Objective: Vital signs in last 24 hours: Temp:  [97.6 F (36.4 C)-98 F (36.7 C)] 98 F (36.7 C) (01/02 0516) Pulse Rate:  [84-90] 90 (01/02 0516) Resp:  [16-17] 16 (01/02 0516) BP: (115-134)/(61-77) 134/77 mmHg (01/02 0516) SpO2:  [96 %-99 %] 96 % (01/02 0516) Last BM Date: 11/14/15  Intake/Output from previous day: 01/01 0701 - 01/02 0700 In: 3066.7 [P.O.:1200; I.V.:1716.7; IV Piggyback:150] Out: -  Intake/Output this shift:    General appearance: Alert.  Cooperative.  Does not look like he is in any distress.  He is frustrated by the diarrhea. Resp: clear to auscultation bilaterally GI: Soft.  Today he seems more tender in the right lower quadrant and is a little bit of percussion tenderness there.  No mass or peritonitis.  Active bowel sounds.  Lab Results:   Recent Labs  11/14/15 0425 11/15/15 0034  WBC 10.1 8.9  HGB 11.5* 11.1*  HCT 33.9* 33.4*  PLT 217 237   BMET  Recent Labs  11/14/15 0425 11/14/15 1134  NA 132* 130*  K 3.5 4.1  CL 98* 97*  CO2 27 27  GLUCOSE 110* 96  BUN 13 10  CREATININE 1.03 0.86  CALCIUM 8.2* 8.1*   PT/INR No results for input(s): LABPROT, INR in the last 72 hours. ABG No results for input(s): PHART, HCO3 in the last 72 hours.  Invalid input(s): PCO2, PO2  Studies/Results: No results found.  Anti-infectives: Anti-infectives    Start     Dose/Rate Route Frequency  Ordered Stop   11/12/15 1730  piperacillin-tazobactam (ZOSYN) IVPB 3.375 g     3.375 g 12.5 mL/hr over 240 Minutes Intravenous 3 times per day 11/12/15 1135     11/12/15 1145  piperacillin-tazobactam (ZOSYN) IVPB 3.375 g     3.375 g 100 mL/hr over 30 Minutes Intravenous  Once 11/12/15 1135 11/12/15 1226      Assessment/Plan:  HD#4 - Acute appendicitis without evidence of perforation or abscess, but with 2 fecaliths present Leukocytosis better physical exam continues to show localized tenderness and a little bit of guarding right lower quadrant. I am concerned that we may not be controlling his infection, and he possibly will need appendectomy this admission I discussed this with him and he understands quite well We will repeat CT scan of abdomen and pelvis today.  Continue to hold Plavix Back diet off to clear liquids.  Diarrhea.  Given his history, I suspect that he has some type of irritable bowel syndrome chronically, and antibiotic use superimposed on this has exacerbated this.  I cannot rule out malabsorption but he's been followed closely by gastroenterology.C. difficile negative We'll start on probiotics. I have increased his IV fluids.  I am going to continue to hold his Plavix today, in case he deteriorates and need surgery.  Continue aspirin CT scan abdomen and pelvis today  CAD-s/p DES August 2016. On plavix(on hold). I suspect he will have  to remain on the plavix.  Severe Aortic Stenosis-to see Dr. [name] next Wednesday. Apparently aortic valve replacement is not urgent.  Hematuria-urine culture. Chronic issue. Sees Dr. Junious Silk for the problem BRBPR-H&H stable. Colonoscopy pending  FEN-diet as tol. , gentle IVF, pain control VTE prophylaxis--SCD/lovenox Dispo-tele   LOS: 3 days    Kaylia Winborne M 11/15/2015

## 2015-11-15 NOTE — Care Management Note (Signed)
Case Management Note  Patient Details  Name: Corey Oneill MRN: [id number] Date of Birth: [birth year]  Subjective/Objective:                    Action/Plan:  Ur updated  Expected Discharge Date:                  Expected Discharge Plan:  Home/Self Care  In-House Referral:     Discharge planning Services     Post Acute Care Choice:    Choice offered to:     DME Arranged:    DME Agency:     HH Arranged:    [place] Agency:     Status of Service:  In process, will continue to follow  Medicare Important Message Given:    Date Medicare IM Given:    Medicare IM give by:    Date Additional Medicare IM Given:    Additional Medicare Important Message give by:     If discussed at [place] of Stay Meetings, dates discussed:    Additional Comments:  Marilu Favre, RN 11/15/2015, 11:19 AM

## 2015-11-16 ENCOUNTER — Encounter (HOSPITAL_COMMUNITY): Admission: EM | Disposition: A | Discharge status: Home / Self Care | Payer: Self-pay | Source: Home / Self Care

## 2015-11-16 ENCOUNTER — Inpatient Hospital Stay (HOSPITAL_COMMUNITY): Payer: Medicare Other | Admitting: Certified Registered Nurse Anesthetist

## 2015-11-16 HISTORY — PX: LAPAROSCOPIC APPENDECTOMY: SHX408

## 2015-11-16 LAB — CBC WITH DIFFERENTIAL/PLATELET
BASOS ABS: 0 10*3/uL (ref 0.0–0.1)
BASOS PCT: 0 %
EOS ABS: 0.2 10*3/uL (ref 0.0–0.7)
EOS PCT: 3 %
HCT: 33.8 % — ABNORMAL LOW (ref 39.0–52.0)
HEMOGLOBIN: 11.2 g/dL — AB (ref 13.0–17.0)
LYMPHS ABS: 0.9 10*3/uL (ref 0.7–4.0)
Lymphocytes Relative: 12 %
MCH: 29.9 pg (ref 26.0–34.0)
MCHC: 33.1 g/dL (ref 30.0–36.0)
MCV: 90.4 fL (ref 78.0–100.0)
Monocytes Absolute: 0.8 10*3/uL (ref 0.1–1.0)
Monocytes Relative: 11 %
NEUTROS PCT: 74 %
Neutro Abs: 5.6 10*3/uL (ref 1.7–7.7)
Platelets: 290 10*3/uL (ref 150–400)
RBC: 3.74 MIL/uL — AB (ref 4.22–5.81)
RDW: 14.6 % (ref 11.5–15.5)
WBC: 7.5 10*3/uL (ref 4.0–10.5)

## 2015-11-16 LAB — COMPREHENSIVE METABOLIC PANEL
ALK PHOS: 55 U/L (ref 38–126)
ALT: 33 U/L (ref 17–63)
AST: 40 U/L (ref 15–41)
Albumin: 2.3 g/dL — ABNORMAL LOW (ref 3.5–5.0)
Anion gap: 7 (ref 5–15)
CALCIUM: 8.3 mg/dL — AB (ref 8.9–10.3)
CO2: 25 mmol/L (ref 22–32)
CREATININE: 0.83 mg/dL (ref 0.61–1.24)
Chloride: 107 mmol/L (ref 101–111)
Glucose, Bld: 93 mg/dL (ref 65–99)
Potassium: 4.7 mmol/L (ref 3.5–5.1)
SODIUM: 139 mmol/L (ref 135–145)
Total Bilirubin: 0.6 mg/dL (ref 0.3–1.2)
Total Protein: 5.6 g/dL — ABNORMAL LOW (ref 6.5–8.1)

## 2015-11-16 LAB — SURGICAL PCR SCREEN
MRSA, PCR: NEGATIVE
STAPHYLOCOCCUS AUREUS: NEGATIVE

## 2015-11-16 SURGERY — APPENDECTOMY, LAPAROSCOPIC
Anesthesia: General | Site: Abdomen

## 2015-11-16 MED ORDER — SODIUM CHLORIDE 0.9 % IR SOLN
Status: DC | PRN
  Administered 2015-11-16: 1000 mL
  Administered 2015-11-16: 1
  Administered 2015-11-16: 1000 mL

## 2015-11-16 MED ORDER — LABETALOL HCL 5 MG/ML IV SOLN
INTRAVENOUS | Status: DC | PRN
  Administered 2015-11-16: 7.5 mg via INTRAVENOUS

## 2015-11-16 MED ORDER — FENTANYL CITRATE (PF) 250 MCG/5ML IJ SOLN
INTRAMUSCULAR | Status: AC
  Filled 2015-11-16: qty 5

## 2015-11-16 MED ORDER — HYDROMORPHONE HCL 1 MG/ML IJ SOLN
1.0000 mg | INTRAMUSCULAR | Status: DC | PRN
  Administered 2015-11-16: 1 mg via INTRAVENOUS
  Filled 2015-11-16: qty 1

## 2015-11-16 MED ORDER — BUPIVACAINE-EPINEPHRINE (PF) 0.5% -1:200000 IJ SOLN
INTRAMUSCULAR | Status: AC
Start: 2015-11-16 — End: 2015-11-16
  Filled 2015-11-16: qty 30

## 2015-11-16 MED ORDER — PROPOFOL 10 MG/ML IV BOLUS
INTRAVENOUS | Status: AC
  Filled 2015-11-16: qty 20

## 2015-11-16 MED ORDER — 0.9 % SODIUM CHLORIDE (POUR BTL) OPTIME
TOPICAL | Status: DC | PRN
  Administered 2015-11-16: 1000 mL

## 2015-11-16 MED ORDER — LACTATED RINGERS IV SOLN: INTRAVENOUS | Status: DC

## 2015-11-16 MED ORDER — SUCCINYLCHOLINE CHLORIDE 20 MG/ML IJ SOLN
INTRAMUSCULAR | Status: DC | PRN
  Administered 2015-11-16: 110 mg via INTRAVENOUS

## 2015-11-16 MED ORDER — ACETAMINOPHEN 500 MG PO TABS: 500.0000 mg | ORAL_TABLET | Freq: Every day | ORAL | Status: DC | PRN

## 2015-11-16 MED ORDER — EPHEDRINE SULFATE 50 MG/ML IJ SOLN
INTRAMUSCULAR | Status: DC | PRN
  Administered 2015-11-16 (×2): 5 mg via INTRAVENOUS

## 2015-11-16 MED ORDER — NEOSTIGMINE METHYLSULFATE 10 MG/10ML IV SOLN
INTRAVENOUS | Status: DC | PRN
  Administered 2015-11-16: 3 mg via INTRAVENOUS

## 2015-11-16 MED ORDER — KCL IN DEXTROSE-NACL 20-5-0.9 MEQ/L-%-% IV SOLN
INTRAVENOUS | Status: DC
  Administered 2015-11-16: via INTRAVENOUS
  Filled 2015-11-16 (×2): qty 1000

## 2015-11-16 MED ORDER — FENTANYL CITRATE (PF) 100 MCG/2ML IJ SOLN
INTRAMUSCULAR | Status: AC
  Administered 2015-11-16: 25 ug via INTRAVENOUS
  Filled 2015-11-16: qty 2

## 2015-11-16 MED ORDER — ROCURONIUM BROMIDE 100 MG/10ML IV SOLN
INTRAVENOUS | Status: DC | PRN
  Administered 2015-11-16: 20 mg via INTRAVENOUS
  Administered 2015-11-16 (×2): 10 mg via INTRAVENOUS

## 2015-11-16 MED ORDER — ALBUMIN HUMAN 5 % IV SOLN
INTRAVENOUS | Status: AC
  Administered 2015-11-16: 12.5 g
  Filled 2015-11-16: qty 250

## 2015-11-16 MED ORDER — OXYCODONE-ACETAMINOPHEN 5-325 MG PO TABS
1.0000 | ORAL_TABLET | ORAL | Status: DC | PRN
  Administered 2015-11-16 – 2015-11-20 (×18): 1 via ORAL
  Filled 2015-11-16 (×19): qty 1

## 2015-11-16 MED ORDER — FENTANYL CITRATE (PF) 100 MCG/2ML IJ SOLN
25.0000 ug | INTRAMUSCULAR | Status: DC | PRN
  Administered 2015-11-16: 50 ug via INTRAVENOUS
  Administered 2015-11-16: 25 ug via INTRAVENOUS

## 2015-11-16 MED ORDER — LACTATED RINGERS IV SOLN
INTRAVENOUS | Status: DC
  Administered 2015-11-16: 15:00:00 via INTRAVENOUS

## 2015-11-16 MED ORDER — FENTANYL CITRATE (PF) 100 MCG/2ML IJ SOLN
INTRAMUSCULAR | Status: DC | PRN
  Administered 2015-11-16 (×3): 50 ug via INTRAVENOUS

## 2015-11-16 MED ORDER — MEPERIDINE HCL 25 MG/ML IJ SOLN: 6.2500 mg | INTRAMUSCULAR | Status: DC | PRN

## 2015-11-16 MED ORDER — PHENYLEPHRINE HCL 10 MG/ML IJ SOLN
INTRAMUSCULAR | Status: DC | PRN
  Administered 2015-11-16 (×3): 40 ug via INTRAVENOUS

## 2015-11-16 MED ORDER — ETOMIDATE 2 MG/ML IV SOLN
INTRAVENOUS | Status: DC | PRN
  Administered 2015-11-16: 11 mg via INTRAVENOUS

## 2015-11-16 MED ORDER — LACTATED RINGERS IV SOLN
INTRAVENOUS | Status: DC | PRN
  Administered 2015-11-16: 16:00:00 via INTRAVENOUS

## 2015-11-16 MED ORDER — BUPIVACAINE-EPINEPHRINE 0.5% -1:200000 IJ SOLN
INTRAMUSCULAR | Status: DC | PRN
  Administered 2015-11-16: 3 mL

## 2015-11-16 MED ORDER — LIDOCAINE HCL (CARDIAC) 20 MG/ML IV SOLN
INTRAVENOUS | Status: DC | PRN
  Administered 2015-11-16: 60 mg via INTRAVENOUS

## 2015-11-16 MED ORDER — PHENYLEPHRINE HCL 10 MG/ML IJ SOLN
10.0000 mg | INTRAVENOUS | Status: DC | PRN
  Administered 2015-11-16: 15 ug/min via INTRAVENOUS

## 2015-11-16 MED ORDER — GLYCOPYRROLATE 0.2 MG/ML IJ SOLN
INTRAMUSCULAR | Status: DC | PRN
  Administered 2015-11-16: .4 mg via INTRAVENOUS

## 2015-11-16 MED ORDER — ONDANSETRON HCL 4 MG/2ML IJ SOLN
INTRAMUSCULAR | Status: DC | PRN
  Administered 2015-11-16: 4 mg via INTRAVENOUS

## 2015-11-16 MED ORDER — PROMETHAZINE HCL 25 MG/ML IJ SOLN: 6.2500 mg | INTRAMUSCULAR | Status: DC | PRN

## 2015-11-16 SURGICAL SUPPLY — 52 items
APPLIER CLIP ROT 10 11.4 M/L (STAPLE)
APR CLP MED LRG 11.4X10 (STAPLE)
BAG SPEC RTRVL LRG 6X4 10 (ENDOMECHANICALS) ×1
BLADE SURG ROTATE 9660 (MISCELLANEOUS) IMPLANT
CANISTER SUCTION 2500CC (MISCELLANEOUS) ×3 IMPLANT
CHLORAPREP W/TINT 26ML (MISCELLANEOUS) ×3 IMPLANT
CLIP APPLIE ROT 10 11.4 M/L (STAPLE) IMPLANT
COVER SURGICAL LIGHT HANDLE (MISCELLANEOUS) ×3 IMPLANT
CUTTER FLEX LINEAR 45M (STAPLE) ×3 IMPLANT
DRAIN CHANNEL 19F RND (DRAIN) ×2 IMPLANT
DRAPE WARM FLUID 44X44 (DRAPE) ×3 IMPLANT
ELECT REM PT RETURN 9FT ADLT (ELECTROSURGICAL) ×3
ELECTRODE REM PT RTRN 9FT ADLT (ELECTROSURGICAL) ×1 IMPLANT
ENDOLOOP SUT PDS II  0 18 (SUTURE)
ENDOLOOP SUT PDS II 0 18 (SUTURE) IMPLANT
EVACUATOR SILICONE 100CC (DRAIN) ×2 IMPLANT
GAUZE SPONGE 2X2 8PLY STRL LF (GAUZE/BANDAGES/DRESSINGS) IMPLANT
GLOVE BIO SURGEON STRL SZ7.5 (GLOVE) ×2 IMPLANT
GLOVE BIO SURGEON STRL SZ8 (GLOVE) ×5 IMPLANT
GLOVE BIOGEL PI IND STRL 6.5 (GLOVE) IMPLANT
GLOVE BIOGEL PI IND STRL 7.5 (GLOVE) IMPLANT
GLOVE BIOGEL PI IND STRL 8 (GLOVE) ×1 IMPLANT
GLOVE BIOGEL PI INDICATOR 6.5 (GLOVE) ×2
GLOVE BIOGEL PI INDICATOR 7.5 (GLOVE) ×2
GLOVE BIOGEL PI INDICATOR 8 (GLOVE) ×2
GLOVE ECLIPSE 6.5 STRL STRAW (GLOVE) ×2 IMPLANT
GOWN STRL REUS W/ TWL LRG LVL3 (GOWN DISPOSABLE) ×2 IMPLANT
GOWN STRL REUS W/ TWL XL LVL3 (GOWN DISPOSABLE) ×1 IMPLANT
GOWN STRL REUS W/TWL LRG LVL3 (GOWN DISPOSABLE) ×6
GOWN STRL REUS W/TWL XL LVL3 (GOWN DISPOSABLE) ×3
KIT BASIN OR (CUSTOM PROCEDURE TRAY) ×3 IMPLANT
KIT ROOM TURNOVER OR (KITS) ×3 IMPLANT
LIQUID BAND (GAUZE/BANDAGES/DRESSINGS) ×3 IMPLANT
NS IRRIG 1000ML POUR BTL (IV SOLUTION) ×3 IMPLANT
PAD ARMBOARD 7.5X6 YLW CONV (MISCELLANEOUS) ×6 IMPLANT
POUCH SPECIMEN RETRIEVAL 10MM (ENDOMECHANICALS) ×3 IMPLANT
RELOAD STAPLE 45 3.5 BLU ETS (ENDOMECHANICALS) ×1 IMPLANT
RELOAD STAPLE TA45 3.5 REG BLU (ENDOMECHANICALS) ×6 IMPLANT
SCALPEL HARMONIC ACE (MISCELLANEOUS) ×3 IMPLANT
SCISSORS LAP 5X35 DISP (ENDOMECHANICALS) ×2 IMPLANT
SET IRRIG TUBING LAPAROSCOPIC (IRRIGATION / IRRIGATOR) ×3 IMPLANT
SPECIMEN JAR SMALL (MISCELLANEOUS) ×3 IMPLANT
SPONGE GAUZE 2X2 STER 10/PKG (GAUZE/BANDAGES/DRESSINGS) ×2
SUT ETHILON 2 0 FS 18 (SUTURE) ×2 IMPLANT
SUT MON AB 4-0 PC3 18 (SUTURE) ×3 IMPLANT
TOWEL OR 17X24 6PK STRL BLUE (TOWEL DISPOSABLE) ×3 IMPLANT
TOWEL OR 17X26 10 PK STRL BLUE (TOWEL DISPOSABLE) ×3 IMPLANT
TRAY FOLEY CATH 16FR SILVER (SET/KITS/TRAYS/PACK) ×3 IMPLANT
TRAY LAPAROSCOPIC MC (CUSTOM PROCEDURE TRAY) ×3 IMPLANT
TROCAR XCEL BLADELESS 5X75MML (TROCAR) ×6 IMPLANT
TROCAR XCEL BLUNT TIP 100MML (ENDOMECHANICALS) ×3 IMPLANT
TUBING INSUFFLATION (TUBING) ×3 IMPLANT

## 2015-11-16 NOTE — Anesthesia Procedure Notes (Signed)
Procedure Name: Intubation Date/Time: 11/16/2015 3:48 PM Performed by: Tressia Miners LEFFEW Pre-anesthesia Checklist: Patient identified, Patient being monitored, Timeout performed, Emergency Drugs available and Suction available Patient Re-evaluated:Patient Re-evaluated prior to inductionOxygen Delivery Method: Circle System Utilized Preoxygenation: Pre-oxygenation with 100% oxygen Intubation Type: IV induction Ventilation: Mask ventilation without difficulty Laryngoscope Size: Glidescope Grade View: Grade I Tube type: Oral Tube size: 7.0 mm Number of attempts: 1 Airway Equipment and Method: Video-laryngoscopy Placement Confirmation: ETT inserted through vocal cords under direct vision,  positive ETCO2 and breath sounds checked- equal and bilateral Secured at: 21 cm Tube secured with: Tape Dental Injury: Teeth and Oropharynx as per pre-operative assessment

## 2015-11-16 NOTE — OR Nursing (Signed)
Dr. Ola Spurr at bedside.  Pts BP low 90s SBP, Hr 87 following fentanyl for pain.  Orders received for completion of liter of LR and if BP remains soft in the 90s, give Albumin 5% 250cc.

## 2015-11-16 NOTE — Interval H&P Note (Signed)
History and Physical Interval Note:  11/16/2015 2:14 PM  Corey Oneill  has presented today for surgery, with the diagnosis of Appendicitis  The various methods of treatment have been discussed with the patient and family. After consideration of risks, benefits and other options for treatment, the patient has consented to  Procedure(s): APPENDECTOMY LAPAROSCOPIC (N/A) as a surgical intervention .  The patient's history has been reviewed, patient examined, no change in status, stable for surgery.  I have reviewed the patient's chart and labs.  Questions were answered to the patient's satisfaction.   The procedure has been discussed with the patient.  Alternative therapies have been discussed with the patient.  Operative risks include bleeding,  Infection,  Organ injury,  Nerve injury,  Blood vessel injury,  DVT,  Pulmonary embolism,  Death,  And possible reoperation.  Medical management risks include worsening of present situation.  The success of the procedure is 50 -90 % at treating patients symptoms.  The patient understands and agrees to proceed.  Kimberlynn Lumbra A.

## 2015-11-16 NOTE — Op Note (Signed)
NAME:  DONTRE, LADUCA NO.:  [id number]  MEDICAL RECORD NO.:  [id number]  LOCATION:  3S15C                        FACILITY:  [facility]  PHYSICIAN:  [name] A. , M.D.DATE OF BIRTH:  July 18, 1936  DATE OF PROCEDURE:  11/16/2015 DATE OF DISCHARGE:                              OPERATIVE REPORT   PREOPERATIVE DIAGNOSIS:  Acute appendicitis.  POSTOPERATIVE DIAGNOSIS:  Perforated appendicitis with large 3 cm right lower quadrant and right pelvic abscess.  PROCEDURE:  Laparoscopic appendectomy.  SURGEON:  [name] A. , M.D.  ANESTHESIA:  General endotracheal anesthesia with 0.25% Sensorcaine local with epinephrine.  ESTIMATED BLOOD LOSS:  Approximately 100 mL.  SPECIMEN:  Appendix in pieces with appendicolith to pathology.  DRAINS:  A 19 round drain to right gutter region and cecal region.  INDICATIONS FOR PROCEDURE:  The patient is a 80 year old male with multiple medical problems.  He was admitted 4 days ago with acute appendicitis, but had been on Plavix for drug-eluting stents.  He also has aortic stenosis and this needs to be addressed.  He was managed non- operatively for the last 4 days, but this failed.  He has been off Plavix now for 2 days, but is having increasing right lower quadrant pain, and at this point, I did not feel he could wait much longer. Talked about operative interventions at this point highlighting the increased risk of bleeding, infection, death, DVT, pelvic abscess, leakage from the staple line or stump line since he has had chronic inflammation for the last 4 days, injury to ureter, injury to kidney, injury to bladder, injury to nerves in the area, injury to small intestine, the need for further surgical intervention with colonic resection, possible small-bowel resection, possible open procedure, percutaneous drainage of abscess if this develops, death, DVT, cardiac issues, pulmonary issues, and the need for other  operative interventions.  We discussed at great length as well as trying to wait longer given his pain level, I did not feel he is going to get better without an operation.  Cardiology saw him and cleared him for surgical intervention at this point.  He understood and wished to proceed.  DESCRIPTION OF PROCEDURE:  The patient was met in the holding area. Questions were answered.  He was taken back to the operating room and placed supine, where general anesthesia initiated in the operating room. His left arm was tucked.  A Foley catheter was placed under sterile conditions.  The abdomen was then prepped and draped in sterile fashion. Time-out was done to verify proper patient and procedure.  A 1 cm supraumbilical incision was made and dissection was carried down to the fascia.  The fascia was opened in the midline, and a pursestring suture 0 Vicryl was placed, and a 12 mm port was placed through this.  We then placed a 5 mm port in the right upper quadrant and 1 in the left upper quadrant.  He had a large phlegmon involving the cecum in the right lower quadrant.  There was a large abscess, as we began to mobilize the cecum up, we entered.  There was a significant chronic inflammation and a large 3 cm abscess.  I mobilized  the cecum from the right abdominal wall.  This helped to roll the cecum up.  The appendix was difficult to find, but I found the tip and traced this all way back to the cecum.  He had blown out to the mid appendix, and there were 2 fecaliths I retrieved, this was where the large abscess was.  I was able to take the mesoappendix down with the Harmonic scalpel.  We had good hemostasis despite him having Plavix in system.  We found the base.  This was very friable, was able to place a GIA 45 stapling device blue load across the base and fired this.  I retrieved the appendix in EndoCatch bag and passed off the field.  I then irrigated with 4 L of saline.  I re- examined the  appendiceal stump and staples had held.  I examined the cecum very carefully and found no holes in the cecum nor the terminal ileum.  This was inflamed, but not violated.  He a large right-sided pelvic sidewall abscess, and I placed a 19 round drain into this area down into the pelvis.  After irrigated with 4 L of saline, it was cleared with no signs of any ongoing bleeding.  I reexamined the area and found it to be hemostatic.  The staple line was held, there was no leakage of stool or other contents that I could see upon interrogation of this area.  At this point, after inspecting the area second time, I saw no bleeding or leaking stool.  Irrigation was suctioned out.  The ports were removed without any evidence of any injury to the internal viscera.  The drain was secured with 2-0 nylon in the left lower quadrant.  4-0 Monocryls were used to close the skin.  All final counts found to be correct of sponge, needle, and instruments.  The patient was awoke, extubated, taken to recovery in satisfactory condition.  Foley catheter and A-line were removed in the operating room.      A. , M.D.     TAC/MEDQ  D:  11/16/2015  T:  11/16/2015  Job:  [id number]

## 2015-11-16 NOTE — Progress Notes (Signed)
Pt's appointment with Dr. [name] has been rescheduled for 01/19 at 12:00. Pt was informed of the change.

## 2015-11-16 NOTE — Transfer of Care (Signed)
Immediate Anesthesia Transfer of Care Note  Patient: Corey Oneill  Procedure(s) Performed: Procedure(s): APPENDECTOMY LAPAROSCOPIC (N/A)  Patient Location: PACU  Anesthesia Type:General  Level of Consciousness: awake, alert , patient cooperative and responds to stimulation  Airway & Oxygen Therapy: Patient Spontanous Breathing and Patient connected to nasal cannula oxygen  Post-op Assessment: Report given to RN, Post -op Vital signs reviewed and stable and Patient moving all extremities X 4  Post vital signs: Reviewed and stable  Last Vitals:  Filed Vitals:   11/16/15 0554 11/16/15 1338  BP: 134/80 139/78  Pulse: 83 80  Temp: 36.4 C 36.5 C  Resp: 17 17    Complications: No apparent anesthesia complications

## 2015-11-16 NOTE — Progress Notes (Signed)
Subjective: Pt still tender RLQ   Objective: Vital signs in last 24 hours: Temp:  [97.5 F (36.4 C)-98 F (36.7 C)] 97.5 F (36.4 C) (01/03 0554) Pulse Rate:  [77-83] 83 (01/03 0554) Resp:  [16-17] 17 (01/03 0554) BP: (118-134)/(63-80) 134/80 mmHg (01/03 0554) SpO2:  [97 %-100 %] 97 % (01/03 0554) Last BM Date: 11/15/15  Intake/Output from previous day: 01/02 0701 - 01/03 0700 In: [place] [P.O.:[address]; I.V.:1375; IV Piggyback:100] Out: 800 [Urine:800] Intake/Output this shift:    GI: TENDER RLQ  REBOUND NOTED   Lab Results:   Recent Labs  11/15/15 0034 11/16/15 0545  WBC 8.9 7.5  HGB 11.1* 11.2*  HCT 33.4* 33.8*  PLT 237 290   BMET  Recent Labs  11/14/15 1134 11/16/15 0545  NA 130* 139  K 4.1 4.7  CL 97* 107  CO2 27 25  GLUCOSE 96 93  BUN 10 <5*  CREATININE 0.86 0.83  CALCIUM 8.1* 8.3*   PT/INR No results for input(s): LABPROT, INR in the last 72 hours. ABG No results for input(s): PHART, HCO3 in the last 72 hours.  Invalid input(s): PCO2, PO2  Studies/Results: Ct Abdomen Pelvis W Contrast  11/15/2015  CLINICAL DATA:  Patient with history of acute appendicitis. Right lower quadrant pain since Tuesday. Followup evaluation. EXAM: CT ABDOMEN AND PELVIS WITH CONTRAST TECHNIQUE: Multidetector CT imaging of the abdomen and pelvis was performed using the standard protocol following bolus administration of intravenous contrast. CONTRAST:  176mL OMNIPAQUE IOHEXOL 300 MG/ML  SOLN COMPARISON:  CT abdomen pelvis 11/12/2015. FINDINGS: Lower chest: Normal heart size. Coronary arterial vascular calcifications. Aortic valve calcifications. Dependent atelectasis within the bilateral lower lobes. Small right pleural effusion. Hepatobiliary: Interval increase in small amount of perihepatic ascites. Liver is normal in size and contour. Too small to characterize low-attenuation lesion right hepatic lobe, stable. Gallbladder is decompressed. Pancreas: Unremarkable Spleen:  Unremarkable Adrenals/Urinary Tract: The adrenal glands are normal. Kidneys enhance symmetrically with contrast. No hydronephrosis. Urinary bladder is unremarkable. Stomach/Bowel: Oral contrast material is demonstrated to the distal small bowel. There a few prominent loops of distal small bowel measuring up to 3.4 cm within the central abdomen and right hemi abdomen. Re- demonstrated dilatation of the appendix containing multiple appendicoliths, compatible with acute appendicitis. There is marked wall thickening of the cecum. Additionally there is wall thickening of the adjacent distal ileum. While lack of contrast within the bowel at this location limits evaluation, there is no definite evidence for perforation. Overall cecum and distal ileal bowel wall thickening as well as surrounding inflammatory change appears worse when compared to prior exam. Vascular/Lymphatic: Normal caliber abdominal aorta. Peripheral calcified atherosclerotic plaque. No retroperitoneal lymphadenopathy. Other: None. Musculoskeletal: No aggressive or acute appearing osseous lesions. IMPRESSION: Re- demonstrated dilatation and wall thickening of appendix compatible with acute appendicitis. Additionally there is wall thickening and surrounding inflammatory stranding about the distal ileum and cecum, adjacent to the appendix. When compared to prior CT, there appears to be worsening inflammatory change involving both the cecum and distal ileum. No oral contrast material is located at this level of the bowel, limiting evaluation for small perforation however there is no definite evidence for frank perforation as would be seen in the setting of free intraperitoneal air or large abscess. Interval increase in perihepatic and perisplenic ascites. There are prominent loops of distal small bowel within the central in right hemiabdomen likely secondary to ileus from inflammatory change. Electronically Signed   By: Lovey Newcomer M.D.   On: 11/15/2015  13:16    Anti-infectives: Anti-infectives    Start     Dose/Rate Route Frequency Ordered Stop   11/12/15 1730  piperacillin-tazobactam (ZOSYN) IVPB 3.375 g     3.375 g 12.5 mL/hr over 240 Minutes Intravenous 3 times per day 11/12/15 1135     11/12/15 1145  piperacillin-tazobactam (ZOSYN) IVPB 3.375 g     3.375 g 100 mL/hr over 30 Minutes Intravenous  Once 11/12/15 1135 11/12/15 1226      Assessment/Plan: Patient Active Problem List   Diagnosis Date Noted  . Protein-calorie malnutrition, severe 11/13/2015  . Acute appendicitis 11/12/2015  . Other allergic rhinitis 06/28/2015  . NSTEMI (non-ST elevated myocardial infarction) ([place]) 06/23/2015  . Ischemic chest pain ([place])   . Unstable angina pectoris ([place]) 06/22/2015  . Unstable angina ([place]) 06/17/2015  . CAD (coronary artery disease), native coronary artery   . Exertional angina ([place]) 04/21/2015  . Esophageal reflux 03/22/2015  . Aortic stenosis   . Depression 11/20/2014  . Prostate cancer ([place]) 03/05/2013  . Erectile dysfunction   . Hyperlipidemia 06/07/2007    Pt still tender RLQ and I doubt will improve on medical management alone. Discussed proceeding with laparoscopic appendectomy at this point. He is at increased risk of bleeding on plavix but he has been off 2 days and given his exam I do not think waiting longer is in his best interest.  The procedure has been discussed with the patient.  Alternative therapies have been discussed with the patient.  Operative risks include bleeding,  Infection,  Organ injury,  Nerve injury,  Blood vessel injury, BOWEL INJURY,  ABSCESS   DVT,  Pulmonary embolism,  Death,  And possible reoperation.  Medical management risks include worsening of present situation.  The success of the procedure is 50 -90 % at treating patients symptoms.  The patient understands and agrees to proceed.   LOS: 4 days    Iqra Rotundo A. 11/16/2015

## 2015-11-16 NOTE — H&P (View-Only) (Signed)
Subjective: Pt still tender RLQ   Objective: Vital signs in last 24 hours: Temp:  [97.5 F (36.4 C)-98 F (36.7 C)] 97.5 F (36.4 C) (01/03 0554) Pulse Rate:  [77-83] 83 (01/03 0554) Resp:  [16-17] 17 (01/03 0554) BP: (118-134)/(63-80) 134/80 mmHg (01/03 0554) SpO2:  [97 %-100 %] 97 % (01/03 0554) Last BM Date: 11/15/15  Intake/Output from previous day: 01/02 0701 - 01/03 0700 In: [place] [P.O.:[address]; I.V.:1375; IV Piggyback:100] Out: 800 [Urine:800] Intake/Output this shift:    GI: TENDER RLQ  REBOUND NOTED   Lab Results:   Recent Labs  11/15/15 0034 11/16/15 0545  WBC 8.9 7.5  HGB 11.1* 11.2*  HCT 33.4* 33.8*  PLT 237 290   BMET  Recent Labs  11/14/15 1134 11/16/15 0545  NA 130* 139  K 4.1 4.7  CL 97* 107  CO2 27 25  GLUCOSE 96 93  BUN 10 <5*  CREATININE 0.86 0.83  CALCIUM 8.1* 8.3*   PT/INR No results for input(s): LABPROT, INR in the last 72 hours. ABG No results for input(s): PHART, HCO3 in the last 72 hours.  Invalid input(s): PCO2, PO2  Studies/Results: Ct Abdomen Pelvis W Contrast  11/15/2015  CLINICAL DATA:  Patient with history of acute appendicitis. Right lower quadrant pain since Tuesday. Followup evaluation. EXAM: CT ABDOMEN AND PELVIS WITH CONTRAST TECHNIQUE: Multidetector CT imaging of the abdomen and pelvis was performed using the standard protocol following bolus administration of intravenous contrast. CONTRAST:  152mL OMNIPAQUE IOHEXOL 300 MG/ML  SOLN COMPARISON:  CT abdomen pelvis 11/12/2015. FINDINGS: Lower chest: Normal heart size. Coronary arterial vascular calcifications. Aortic valve calcifications. Dependent atelectasis within the bilateral lower lobes. Small right pleural effusion. Hepatobiliary: Interval increase in small amount of perihepatic ascites. Liver is normal in size and contour. Too small to characterize low-attenuation lesion right hepatic lobe, stable. Gallbladder is decompressed. Pancreas: Unremarkable Spleen:  Unremarkable Adrenals/Urinary Tract: The adrenal glands are normal. Kidneys enhance symmetrically with contrast. No hydronephrosis. Urinary bladder is unremarkable. Stomach/Bowel: Oral contrast material is demonstrated to the distal small bowel. There a few prominent loops of distal small bowel measuring up to 3.4 cm within the central abdomen and right hemi abdomen. Re- demonstrated dilatation of the appendix containing multiple appendicoliths, compatible with acute appendicitis. There is marked wall thickening of the cecum. Additionally there is wall thickening of the adjacent distal ileum. While lack of contrast within the bowel at this location limits evaluation, there is no definite evidence for perforation. Overall cecum and distal ileal bowel wall thickening as well as surrounding inflammatory change appears worse when compared to prior exam. Vascular/Lymphatic: Normal caliber abdominal aorta. Peripheral calcified atherosclerotic plaque. No retroperitoneal lymphadenopathy. Other: None. Musculoskeletal: No aggressive or acute appearing osseous lesions. IMPRESSION: Re- demonstrated dilatation and wall thickening of appendix compatible with acute appendicitis. Additionally there is wall thickening and surrounding inflammatory stranding about the distal ileum and cecum, adjacent to the appendix. When compared to prior CT, there appears to be worsening inflammatory change involving both the cecum and distal ileum. No oral contrast material is located at this level of the bowel, limiting evaluation for small perforation however there is no definite evidence for frank perforation as would be seen in the setting of free intraperitoneal air or large abscess. Interval increase in perihepatic and perisplenic ascites. There are prominent loops of distal small bowel within the central in right hemiabdomen likely secondary to ileus from inflammatory change. Electronically Signed   By: Lovey Newcomer M.D.   On: 11/15/2015  13:16    Anti-infectives: Anti-infectives    Start     Dose/Rate Route Frequency Ordered Stop   11/12/15 1730  piperacillin-tazobactam (ZOSYN) IVPB 3.375 g     3.375 g 12.5 mL/hr over 240 Minutes Intravenous 3 times per day 11/12/15 1135     11/12/15 1145  piperacillin-tazobactam (ZOSYN) IVPB 3.375 g     3.375 g 100 mL/hr over 30 Minutes Intravenous  Once 11/12/15 1135 11/12/15 1226      Assessment/Plan: Patient Active Problem List   Diagnosis Date Noted  . Protein-calorie malnutrition, severe 11/13/2015  . Acute appendicitis 11/12/2015  . Other allergic rhinitis 06/28/2015  . NSTEMI (non-ST elevated myocardial infarction) ([place]) 06/23/2015  . Ischemic chest pain ([place])   . Unstable angina pectoris ([place]) 06/22/2015  . Unstable angina ([place]) 06/17/2015  . CAD (coronary artery disease), native coronary artery   . Exertional angina ([place]) 04/21/2015  . Esophageal reflux 03/22/2015  . Aortic stenosis   . Depression 11/20/2014  . Prostate cancer ([place]) 03/05/2013  . Erectile dysfunction   . Hyperlipidemia 06/07/2007    Pt still tender RLQ and I doubt will improve on medical management alone. Discussed proceeding with laparoscopic appendectomy at this point. He is at increased risk of bleeding on plavix but he has been off 2 days and given his exam I do not think waiting longer is in his best interest.  The procedure has been discussed with the patient.  Alternative therapies have been discussed with the patient.  Operative risks include bleeding,  Infection,  Organ injury,  Nerve injury,  Blood vessel injury, BOWEL INJURY,  ABSCESS   DVT,  Pulmonary embolism,  Death,  And possible reoperation.  Medical management risks include worsening of present situation.  The success of the procedure is 50 -90 % at treating patients symptoms.  The patient understands and agrees to proceed.   LOS: 4 days    Hennie Gosa A. 11/16/2015

## 2015-11-16 NOTE — Brief Op Note (Signed)
11/12/2015 - 11/16/2015  5:12 PM  PATIENT:  Corey Oneill  80 y.o. male  PRE-OPERATIVE DIAGNOSIS:  Appendicitis  POST-OPERATIVE DIAGNOSIS:  Appendicitis perforated with pelvic abscess  PROCEDURE:  Procedure(s): APPENDECTOMY LAPAROSCOPIC (N/A)  SURGEON:  Surgeon(s) and Role:    * [name], MD - Primary      ANESTHESIA:   general  EBL:  Total I/O In: 600 [I.V.:600] Out: [id number] [Urine:650; Blood:25]  BLOOD ADMINISTERED:none  DRAINS: 19  round blake drain   LOCAL MEDICATIONS USED:  BUPIVICAINE   SPECIMEN:  Source of Specimen:  appendix  DISPOSITION OF SPECIMEN:  PATHOLOGY  COUNTS:  YES  TOURNIQUET:  * No tourniquets in log *  DICTATION: .Other Dictation: Dictation Number [id number]  PLAN OF CARE: Admit to inpatient   PATIENT DISPOSITION:  PACU - hemodynamically stable.   Delay start of Pharmacological VTE agent (>24hrs) due to surgical blood loss or risk of bleeding: no

## 2015-11-16 NOTE — Anesthesia Postprocedure Evaluation (Signed)
Anesthesia Post Note  Patient: Corey Oneill  Procedure(s) Performed: Procedure(s) (LRB): APPENDECTOMY LAPAROSCOPIC (N/A)  Patient location during evaluation: PACU Anesthesia Type: General Level of consciousness: awake and alert Pain management: pain level controlled Vital Signs Assessment: post-procedure vital signs reviewed and stable Respiratory status: spontaneous breathing, nonlabored ventilation, respiratory function stable and patient connected to nasal cannula oxygen Cardiovascular status: blood pressure returned to baseline and stable Postop Assessment: no signs of nausea or vomiting Anesthetic complications: no    Last Vitals:  Filed Vitals:   11/16/15 2026 11/16/15 2030  BP: 114/64   Pulse:  87  Temp:    Resp:  10    Last Pain:  Filed Vitals:   11/16/15 2030  PainSc: Asleep                 Joshwa Hemric,W. EDMOND

## 2015-11-16 NOTE — OR Nursing (Signed)
Notified Dr. Gershon Crane regarding increased serosang dng out of JP drain.  310 cc since 1730.  Expected.  No new orders.

## 2015-11-16 NOTE — Anesthesia Preprocedure Evaluation (Addendum)
Anesthesia Evaluation  Patient identified by MRN, date of birth, ID band Patient awake    Reviewed: Allergy & Precautions, NPO status , Patient's Chart, lab work & pertinent test results  Airway Mallampati: II   Neck ROM: Full  Mouth opening: Limited Mouth Opening  Dental  (+) Teeth Intact   Pulmonary former smoker,    breath sounds clear to auscultation       Cardiovascular + CAD, + Past MI and + Cardiac Stents   Rhythm:Regular Rate:Normal + Systolic murmurs LHC [id number]: Mid to distal LAD 30%, OM1 40%, proximal mid RCA 40%, distal RCA 60% >> FFR 0.69 >> PCI: 3 x 15 mm Resolute DES   Neuro/Psych PSYCHIATRIC DISORDERS Depression negative neurological ROS     GI/Hepatic Neg liver ROS, GERD  ,  Endo/Other  negative endocrine ROS  Renal/GU negative Renal ROS  negative genitourinary   Musculoskeletal negative musculoskeletal ROS (+)   Abdominal   Peds negative pediatric ROS (+)  Hematology negative hematology ROS (+)   Anesthesia Other Findings   Reproductive/Obstetrics negative OB ROS                            Lab Results  Component Value Date   WBC 7.5 11/16/2015   HGB 11.2* 11/16/2015   HCT 33.8* 11/16/2015   MCV 90.4 11/16/2015   PLT 290 11/16/2015   Lab Results  Component Value Date   CREATININE 0.83 11/16/2015   BUN <5* 11/16/2015   NA 139 11/16/2015   K 4.7 11/16/2015   CL 107 11/16/2015   CO2 25 11/16/2015   Lab Results  Component Value Date   INR 1.24 06/17/2015   INR 1.1* 04/16/2015   11/2015: EKG: normal sinus rhythm.  10/2015: Echo - Left ventricle: The cavity size was normal. There was mild concentric hypertrophy. Systolic function was normal. The estimated ejection fraction was in the range of 55% to 60%. Wall motion was normal; there were no regional wall motion abnormalities. Doppler parameters are consistent with abnormal left ventricular relaxation  (grade 1 diastolic dysfunction). - Aortic valve: Valve mobility was restricted. There was severe stenosis. Valve area (Vmax): 0.82 cm^2. - Mitral valve: Moderately calcified annulus. - Right ventricle: The cavity size was normal. Wall thickness was normal. Systolic function was normal. - Tricuspid valve: There was trivial regurgitation. - Inferior vena cava: The vessel was normal in size. The respirophasic diameter changes were in the normal range (>= 50%), consistent with normal central venous pressure.   Anesthesia Physical Anesthesia Plan  ASA: III  Anesthesia Plan: General   Post-op Pain Management:    Induction: Intravenous  Airway Management Planned: Oral ETT and Video Laryngoscope Planned  Additional Equipment: Arterial line  Intra-op Plan:   Post-operative Plan: Extubation in OR  Informed Consent: I have reviewed the patients History and Physical, chart, labs and discussed the procedure including the risks, benefits and alternatives for the proposed anesthesia with the patient or authorized representative who has indicated his/her understanding and acceptance.   Dental advisory given  Plan Discussed with: CRNA  Anesthesia Plan Comments: (Due to patients cardiac history and chronic complaint of chest discomfort despite his recent stent interventions, the plan will be for awake arterial line. )       Anesthesia Quick Evaluation

## 2015-11-17 ENCOUNTER — Encounter: Payer: Medicare Other | Admitting: Cardiothoracic Surgery

## 2015-11-17 ENCOUNTER — Encounter (HOSPITAL_COMMUNITY): Payer: Self-pay | Admitting: Surgery

## 2015-11-17 LAB — COMPREHENSIVE METABOLIC PANEL
ALK PHOS: 42 U/L (ref 38–126)
ALT: 21 U/L (ref 17–63)
ANION GAP: 8 (ref 5–15)
AST: 20 U/L (ref 15–41)
Albumin: 2.3 g/dL — ABNORMAL LOW (ref 3.5–5.0)
BUN: 6 mg/dL (ref 6–20)
CO2: 22 mmol/L (ref 22–32)
Calcium: 7.9 mg/dL — ABNORMAL LOW (ref 8.9–10.3)
Chloride: 105 mmol/L (ref 101–111)
Creatinine, Ser: 0.74 mg/dL (ref 0.61–1.24)
GFR calc non Af Amer: >60 mL/min (ref >60)
GLUCOSE: 126 mg/dL — AB (ref 65–99)
POTASSIUM: 4.3 mmol/L (ref 3.5–5.1)
Sodium: 135 mmol/L (ref 135–145)
TOTAL PROTEIN: 4.9 g/dL — AB (ref 6.5–8.1)
Total Bilirubin: 0.5 mg/dL (ref 0.3–1.2)

## 2015-11-17 LAB — CBC
HCT: 32.8 % — ABNORMAL LOW (ref 39.0–52.0)
HEMOGLOBIN: 10.7 g/dL — AB (ref 13.0–17.0)
MCH: 29.7 pg (ref 26.0–34.0)
MCHC: 32.6 g/dL (ref 30.0–36.0)
MCV: 91.1 fL (ref 78.0–100.0)
PLATELETS: 272 10*3/uL (ref 150–400)
RBC: 3.6 MIL/uL — AB (ref 4.22–5.81)
RDW: 14.7 % (ref 11.5–15.5)
WBC: 11.9 10*3/uL — AB (ref 4.0–10.5)

## 2015-11-17 MED ORDER — SODIUM CHLORIDE 0.9 % IJ SOLN
3.0000 mL | Freq: Two times a day (BID) | INTRAMUSCULAR | Status: DC
  Administered 2015-11-17 – 2015-11-18 (×2): 3 mL via INTRAVENOUS

## 2015-11-17 MED ORDER — SODIUM CHLORIDE 0.9 % IJ SOLN: 3.0000 mL | INTRAMUSCULAR | Status: DC | PRN

## 2015-11-17 NOTE — Progress Notes (Signed)
Attempted report 

## 2015-11-17 NOTE — Progress Notes (Signed)
Patient ID: Corey Oneill, male   DOB: [birth year], 80 y.o.   MRN: [id number]     [address] [place], [place] [address]    Phone: [phone] FAX: [phone]     Subjective: No n/v. No flatus.  Tolerating clears.  Ambulating. No sob, cp.  573m from drain.  Objective:  Vital signs:  Filed Vitals:   11/16/15 2335 11/17/15 0400 11/17/15 0620 11/17/15 0733  BP: 106/60 118/70 110/60   Pulse: 90 103 93   Temp: 98.1 F (36.7 C) 97.8 F (36.6 C)  97.9 F (36.6 C)  TempSrc: Oral Oral  Oral  Resp: 14 33 13   Height:      Weight:      SpO2: 98% 100% 98%     Last BM Date: 11/16/15  Intake/Output   Yesterday:  01/03 0701 - 01/04 0700 In: 2146.3 [P.O.:360; I.V.:1386.3; IV Piggyback:400] Out: 16226[Urine:1225; Drains:500; Blood:25] This shift:  Total I/O In: -  Out: 40 [Drains:40]  Physical Exam: General: Pt awake/alert/oriented x4 in no acute distress Chest: cta.  CV:  s1s2 rrr.  3/6 SEM MS: Normal AROM mjr joints.  No obvious deformity Abdomen: +Bs, abdomen is soft, appropriately tender. Incisions are c/d/i.  Drain with serosanguinous output, Ext:  No mjr edema.  No cyanosis Skin: No petechiae / purpura   Problem List:   Active Problems:   Acute appendicitis   Protein-calorie malnutrition, severe    Results:   Labs: Results for orders placed or performed during the hospital encounter of 11/12/15 (from the past 48 hour(s))  CBC with Differential/Platelet     Status: Abnormal   Collection Time: 11/16/15  5:45 AM  Result Value Ref Range   WBC 7.5 4.0 - 10.5 K/uL   RBC 3.74 (L) 4.22 - 5.81 MIL/uL   Hemoglobin 11.2 (L) 13.0 - 17.0 g/dL   HCT 33.8 (L) 39.0 - 52.0 %   MCV 90.4 78.0 - 100.0 fL   MCH 29.9 26.0 - 34.0 pg   MCHC 33.1 30.0 - 36.0 g/dL   RDW 14.6 11.5 - 15.5 %   Platelets 290 150 - 400 K/uL   Neutrophils Relative % 74 %   Neutro Abs 5.6 1.7 - 7.7 K/uL   Lymphocytes Relative 12 %    Lymphs Abs 0.9 0.7 - 4.0 K/uL   Monocytes Relative 11 %   Monocytes Absolute 0.8 0.1 - 1.0 K/uL   Eosinophils Relative 3 %   Eosinophils Absolute 0.2 0.0 - 0.7 K/uL   Basophils Relative 0 %   Basophils Absolute 0.0 0.0 - 0.1 K/uL  Comprehensive metabolic panel     Status: Abnormal   Collection Time: 11/16/15  5:45 AM  Result Value Ref Range   Sodium 139 135 - 145 mmol/L    Comment: DELTA CHECK NOTED   Potassium 4.7 3.5 - 5.1 mmol/L   Chloride 107 101 - 111 mmol/L   CO2 25 22 - 32 mmol/L   Glucose, Bld 93 65 - 99 mg/dL   BUN <5 (L) 6 - 20 mg/dL   Creatinine, Ser 0.83 0.61 - 1.24 mg/dL   Calcium 8.3 (L) 8.9 - 10.3 mg/dL   Total Protein 5.6 (L) 6.5 - 8.1 g/dL   Albumin 2.3 (L) 3.5 - 5.0 g/dL   AST 40 15 - 41 U/L   ALT 33 17 - 63 U/L   Alkaline Phosphatase 55 38 - 126 U/L  Total Bilirubin 0.6 0.3 - 1.2 mg/dL   GFR calc non Af Amer >60 >60 mL/min   GFR calc Af Amer >60 >60 mL/min    Comment: (NOTE) The eGFR has been calculated using the CKD EPI equation. This calculation has not been validated in all clinical situations. eGFR's persistently <60 mL/min signify possible Chronic Kidney Disease.    Anion gap 7 5 - 15  Surgical pcr screen     Status: None   Collection Time: 11/16/15 10:38 AM  Result Value Ref Range   MRSA, PCR NEGATIVE NEGATIVE   Staphylococcus aureus NEGATIVE NEGATIVE    Comment:        The Xpert SA Assay (FDA approved for NASAL specimens in patients over 20 years of age), is one component of a comprehensive surveillance program.  Test performance has been validated by [facility] for patients greater than or equal to 78 year old. It is not intended to diagnose infection nor to guide or monitor treatment.   CBC     Status: Abnormal   Collection Time: 11/17/15  5:36 AM  Result Value Ref Range   WBC 11.9 (H) 4.0 - 10.5 K/uL   RBC 3.60 (L) 4.22 - 5.81 MIL/uL   Hemoglobin 10.7 (L) 13.0 - 17.0 g/dL   HCT 32.8 (L) 39.0 - 52.0 %   MCV 91.1 78.0 - 100.0  fL   MCH 29.7 26.0 - 34.0 pg   MCHC 32.6 30.0 - 36.0 g/dL   RDW 14.7 11.5 - 15.5 %   Platelets 272 150 - 400 K/uL  Comprehensive metabolic panel     Status: Abnormal   Collection Time: 11/17/15  5:36 AM  Result Value Ref Range   Sodium 135 135 - 145 mmol/L   Potassium 4.3 3.5 - 5.1 mmol/L   Chloride 105 101 - 111 mmol/L   CO2 22 22 - 32 mmol/L   Glucose, Bld 126 (H) 65 - 99 mg/dL   BUN 6 6 - 20 mg/dL   Creatinine, Ser 0.74 0.61 - 1.24 mg/dL   Calcium 7.9 (L) 8.9 - 10.3 mg/dL   Total Protein 4.9 (L) 6.5 - 8.1 g/dL   Albumin 2.3 (L) 3.5 - 5.0 g/dL   AST 20 15 - 41 U/L   ALT 21 17 - 63 U/L   Alkaline Phosphatase 42 38 - 126 U/L   Total Bilirubin 0.5 0.3 - 1.2 mg/dL   GFR calc non Af Amer >60 >60 mL/min   GFR calc Af Amer >60 >60 mL/min    Comment: (NOTE) The eGFR has been calculated using the CKD EPI equation. This calculation has not been validated in all clinical situations. eGFR's persistently <60 mL/min signify possible Chronic Kidney Disease.    Anion gap 8 5 - 15    Imaging / Studies: Ct Abdomen Pelvis W Contrast  11/15/2015  CLINICAL DATA:  Patient with history of acute appendicitis. Right lower quadrant pain since Tuesday. Followup evaluation. EXAM: CT ABDOMEN AND PELVIS WITH CONTRAST TECHNIQUE: Multidetector CT imaging of the abdomen and pelvis was performed using the standard protocol following bolus administration of intravenous contrast. CONTRAST:  168m OMNIPAQUE IOHEXOL 300 MG/ML  SOLN COMPARISON:  CT abdomen pelvis 11/12/2015. FINDINGS: Lower chest: Normal heart size. Coronary arterial vascular calcifications. Aortic valve calcifications. Dependent atelectasis within the bilateral lower lobes. Small right pleural effusion. Hepatobiliary: Interval increase in small amount of perihepatic ascites. Liver is normal in size and contour. Too small to characterize low-attenuation lesion right hepatic lobe, stable. Gallbladder  is decompressed. Pancreas: Unremarkable Spleen:  Unremarkable Adrenals/Urinary Tract: The adrenal glands are normal. Kidneys enhance symmetrically with contrast. No hydronephrosis. Urinary bladder is unremarkable. Stomach/Bowel: Oral contrast material is demonstrated to the distal small bowel. There a few prominent loops of distal small bowel measuring up to 3.4 cm within the central abdomen and right hemi abdomen. Re- demonstrated dilatation of the appendix containing multiple appendicoliths, compatible with acute appendicitis. There is marked wall thickening of the cecum. Additionally there is wall thickening of the adjacent distal ileum. While lack of contrast within the bowel at this location limits evaluation, there is no definite evidence for perforation. Overall cecum and distal ileal bowel wall thickening as well as surrounding inflammatory change appears worse when compared to prior exam. Vascular/Lymphatic: Normal caliber abdominal aorta. Peripheral calcified atherosclerotic plaque. No retroperitoneal lymphadenopathy. Other: None. Musculoskeletal: No aggressive or acute appearing osseous lesions. IMPRESSION: Re- demonstrated dilatation and wall thickening of appendix compatible with acute appendicitis. Additionally there is wall thickening and surrounding inflammatory stranding about the distal ileum and cecum, adjacent to the appendix. When compared to prior CT, there appears to be worsening inflammatory change involving both the cecum and distal ileum. No oral contrast material is located at this level of the bowel, limiting evaluation for small perforation however there is no definite evidence for frank perforation as would be seen in the setting of free intraperitoneal air or large abscess. Interval increase in perihepatic and perisplenic ascites. There are prominent loops of distal small bowel within the central in right hemiabdomen likely secondary to ileus from inflammatory change. Electronically Signed   By: Lovey Newcomer M.D.   On: 11/15/2015  13:16    Medications / Allergies:  Scheduled Meds: . aspirin EC  81 mg Oral Daily  . buPROPion  100 mg Oral Daily  . enoxaparin (LOVENOX) injection  40 mg Subcutaneous Q24H  . ferrous fumarate  1 tablet Oral Daily  . FLUoxetine  10 mg Oral Daily  . lactose free nutrition  237 mL Oral TID WC  . Magnesium  200 mg Oral Daily  . piperacillin-tazobactam (ZOSYN)  IV  3.375 g Intravenous 3 times per day  . rosuvastatin  10 mg Oral Daily  . saccharomyces boulardii  250 mg Oral TID  . sodium chloride  3 mL Intravenous Q12H   Continuous Infusions:  PRN Meds:.acetaminophen **OR** acetaminophen, HYDROmorphone (DILAUDID) injection, morphine injection, nitroGLYCERIN, ondansetron **OR** ondansetron (ZOFRAN) IV, oxyCODONE-acetaminophen, sodium chloride  Antibiotics: Anti-infectives    Start     Dose/Rate Route Frequency Ordered Stop   11/12/15 1730  piperacillin-tazobactam (ZOSYN) IVPB 3.375 g     3.375 g 12.5 mL/hr over 240 Minutes Intravenous 3 times per day 11/12/15 1135     11/12/15 1145  piperacillin-tazobactam (ZOSYN) IVPB 3.375 g     3.375 g 100 mL/hr over 30 Minutes Intravenous  Once 11/12/15 1135 11/12/15 1226        Assessment/Plan Perforated appendicitis with abscess POD#1 laparoscopic appendectomy---Dr. [name] -advance to fulls -mobilize, IS -continue drain(watch for leak/fistula) ID-zosyn D#5.  WBC up, repeat in AM CAD-s/p DES August 2016. On plavix(on hold).?when to resume  Severe Aortic Stenosis-to see Dr. [name]. Apparently aortic valve replacement is not urgent.  Hematuria-urine culture +staph and enterococcus faecalis.  PCN sensitive, treated.  FEN-fulls, PO pain meds, DC IVF. VTE prophylaxis--SCD/lovenox Dispo-tele    Erby Pian, [facility] Pager [phone]) For consults and floor pages call [phone](7A-4:30P)  11/17/2015 7:47 AM

## 2015-11-17 NOTE — Care Management Note (Signed)
Case Management Note  Patient Details  Name: Corey Oneill MRN: [id number] Date of Birth: Apr 29, 1936  Subjective/Objective:                    Action/Plan:  UR updated  Expected Discharge Date:                  Expected Discharge Plan:  Home/Self Care  In-House Referral:     Discharge planning Services     Post Acute Care Choice:    Choice offered to:     DME Arranged:    DME Agency:     HH Arranged:    [place] Agency:     Status of Service:  In process, will continue to follow  Medicare Important Message Given:  Yes Date Medicare IM Given:    Medicare IM give by:    Date Additional Medicare IM Given:    Additional Medicare Important Message give by:     If discussed at [place] of Stay Meetings, dates discussed:    Additional Comments:  Marilu Favre, RN 11/17/2015, 11:41 AM

## 2015-11-17 NOTE — Progress Notes (Signed)
Report given to [facility]. Family aware of pt transfer.

## 2015-11-17 NOTE — Progress Notes (Signed)
Pt admitted to 6N27 via wheelchair from 3S15.  Pt AAO 4.  Pt on RA.  Pt has 22G to rt hand SL.  Pt has 3 abd lap sites with skin glue c/d/i.  JP drain to lt side with dry dsg with old drainage on dsg and serosanguineous fluid.  Pt wearing brief.  Belongings to bedside.  Report rcvd from No Name, Therapist, sports.  Pt has no questions at the moment.  Will continue to monitor.

## 2015-11-17 NOTE — Progress Notes (Signed)
Nutrition Follow-up  DOCUMENTATION CODES:   Severe malnutrition in context of chronic illness  INTERVENTION:   -Continue Boost Plus TID  NUTRITION DIAGNOSIS:   Increased nutrient needs related to acute illness as evidenced by estimated needs.  Ongoing  GOAL:   Patient will meet greater than or equal to 90% of their needs  Progressing  MONITOR:   PO intake, Supplement acceptance, Labs, Weight trends, I & O's  REASON FOR ASSESSMENT:   Malnutrition Screening Tool    ASSESSMENT:   80 yo Male with a history of prostate cancer s/p radiation, HLD, CAD s/p DES to RCA 06/18/15, aortic stenosis he presents today with a 3 day history of abdominal pain. Initially mild and intermittent. Location was in the periumbilical region. Localized to the RLQ last night and worsened. He was seen by Dr. Burt Knack yesterday, but symptoms were not bothersome enough to mention.  S/p Procedure(s) on 11/15/14: APPENDECTOMY LAPAROSCOPIC (N/A)  Pt transferred from ICU to surgical floor earlier today.   Pt on a full liquid diet and is tolerating well. Noted 50-100% meal completion. Pt is taking Boost supplement well.   Labs reviewed.   Diet Order:  Diet full liquid Room service appropriate?: Yes; Fluid consistency:: Thin  Skin:  Reviewed, no issues  Last BM:  12/31  Height:   Ht Readings from Last 1 Encounters:  11/12/15 6' (1.829 m)    Weight:   Wt Readings from Last 1 Encounters:  11/12/15 142 lb (64.411 kg)    Ideal Body Weight:  81 kg  BMI:  Body mass index is 19.25 kg/(m^2).  Estimated Nutritional Needs:   Kcal:  1800-2000  Protein:  90-100 gm  Fluid:  1.8-2.0 L  EDUCATION NEEDS:   No education needs identified at this time  Lorrie Gargan A. Jimmye Norman, RD, LDN, CDE Pager: [phone] After hours Pager: [phone]

## 2015-11-18 LAB — CBC
HCT: 32.3 % — ABNORMAL LOW (ref 39.0–52.0)
HEMOGLOBIN: 10.8 g/dL — AB (ref 13.0–17.0)
MCH: 30.3 pg (ref 26.0–34.0)
MCHC: 33.4 g/dL (ref 30.0–36.0)
MCV: 90.5 fL (ref 78.0–100.0)
PLATELETS: 264 10*3/uL (ref 150–400)
RBC: 3.57 MIL/uL — AB (ref 4.22–5.81)
RDW: 14.4 % (ref 11.5–15.5)
WBC: 12.4 10*3/uL — AB (ref 4.0–10.5)

## 2015-11-18 MED ORDER — SODIUM CHLORIDE 0.9 % IV SOLN
INTRAVENOUS | Status: DC
  Administered 2015-11-20: 06:00:00 via INTRAVENOUS
  Filled 2015-11-18: qty 1000

## 2015-11-18 NOTE — Care Management Important Message (Signed)
Important Message  Patient Details  Name: Corey Oneill MRN: [id number] Date of Birth: 1936-07-30   Medicare Important Message Given:  Yes    Corey Oneill P Baca 11/18/2015, 2:17 PM

## 2015-11-18 NOTE — Progress Notes (Signed)
Notified Jocelyn Lamer of swollen penis and testicles. Will let patient know it is due to the IV fluid.

## 2015-11-18 NOTE — Progress Notes (Signed)
Patient ID: Corey Oneill, male   DOB: 11-26-1935, 80 y.o.   MRN: [id number] 2 Days Post-Op  Subjective: Pt having some mid abdominal pain this morning along with some nausea and intermittent dry heaves.  Feels full.  Objective: Vital signs in last 24 hours: Temp:  [97.8 F (36.6 C)-98.6 F (37 C)] 98.6 F (37 C) (01/05 0617) Pulse Rate:  [83-94] 90 (01/05 0617) Resp:  [16-18] 18 (01/05 0617) BP: (98-122)/(46-69) 122/69 mmHg (01/05 0617) SpO2:  [95 %-100 %] 95 % (01/05 0617) Last BM Date: 11/16/15  Intake/Output from previous day: 01/04 0701 - 01/05 0700 In: 200 [IV Piggyback:100] Out: 1455 [Urine:1125; Drains:330] Intake/Output this shift:    PE: Abd: soft, mildly bloated, hypoactive BS, appropriately tender, JP drain with serous output Heart: regular Lungs: CTAB  Lab Results:   Recent Labs  11/17/15 0536 11/18/15 0620  WBC 11.9* 12.4*  HGB 10.7* 10.8*  HCT 32.8* 32.3*  PLT 272 264   BMET  Recent Labs  11/16/15 0545 11/17/15 0536  NA 139 135  K 4.7 4.3  CL 107 105  CO2 25 22  GLUCOSE 93 126*  BUN <5* 6  CREATININE 0.83 0.74  CALCIUM 8.3* 7.9*   PT/INR No results for input(s): LABPROT, INR in the last 72 hours. CMP     Component Value Date/Time   NA 135 11/17/2015 0536   K 4.3 11/17/2015 0536   CL 105 11/17/2015 0536   CO2 22 11/17/2015 0536   GLUCOSE 126* 11/17/2015 0536   BUN 6 11/17/2015 0536   CREATININE 0.74 11/17/2015 0536   CREATININE 0.87 10/12/2015 0934   CALCIUM 7.9* 11/17/2015 0536   PROT 4.9* 11/17/2015 0536   ALBUMIN 2.3* 11/17/2015 0536   AST 20 11/17/2015 0536   ALT 21 11/17/2015 0536   ALKPHOS 42 11/17/2015 0536   BILITOT 0.5 11/17/2015 0536   GFRNONAA >60 11/17/2015 0536   GFRAA >60 11/17/2015 0536   Lipase     Component Value Date/Time   LIPASE 20 11/12/2015 0806       Studies/Results: No results found.  Anti-infectives: Anti-infectives    Start     Dose/Rate Route Frequency Ordered Stop   11/12/15 1730   piperacillin-tazobactam (ZOSYN) IVPB 3.375 g     3.375 g 12.5 mL/hr over 240 Minutes Intravenous 3 times per day 11/12/15 1135     11/12/15 1145  piperacillin-tazobactam (ZOSYN) IVPB 3.375 g     3.375 g 100 mL/hr over 30 Minutes Intravenous  Once 11/12/15 1135 11/12/15 1226       Assessment/Plan  Perforated appendicitis with abscess POD#2 laparoscopic appendectomy---Dr. [name] -mild ileus, decrease back to clear liquids to help decrease nausea -mobilize, IS -continue drain(watch for leak/fistula) ID-zosyn D#6. WBC up, repeat in AM CAD-s/p DES August 2016. On plavix(on hold).will resume plavix tomorrow  Severe Aortic Stenosis-to see Dr. [name]. Apparently aortic valve replacement is not urgent.  Hematuria-urine culture +staph and enterococcus faecalis. PCN sensitive, treated.  FEN-clears, PO pain meds, restart low rate IVFs while not taking in much due to ileus VTE prophylaxis--SCD/lovenox Dispo-tele   LOS: 6 days    Tanaka Gillen E 11/18/2015, 7:55 AM Pager: [phone]

## 2015-11-19 LAB — CBC
HCT: 34.2 % — ABNORMAL LOW (ref 39.0–52.0)
HEMOGLOBIN: 11.1 g/dL — AB (ref 13.0–17.0)
MCH: 29.6 pg (ref 26.0–34.0)
MCHC: 32.5 g/dL (ref 30.0–36.0)
MCV: 91.2 fL (ref 78.0–100.0)
Platelets: 328 10*3/uL (ref 150–400)
RBC: 3.75 MIL/uL — AB (ref 4.22–5.81)
RDW: 14.4 % (ref 11.5–15.5)
WBC: 11 10*3/uL — ABNORMAL HIGH (ref 4.0–10.5)

## 2015-11-19 LAB — BASIC METABOLIC PANEL
ANION GAP: 9 (ref 5–15)
BUN: 6 mg/dL (ref 6–20)
CALCIUM: 8.4 mg/dL — AB (ref 8.9–10.3)
CHLORIDE: 101 mmol/L (ref 101–111)
CO2: 26 mmol/L (ref 22–32)
CREATININE: 0.85 mg/dL (ref 0.61–1.24)
GFR calc non Af Amer: >60 mL/min (ref >60)
GLUCOSE: 92 mg/dL (ref 65–99)
Potassium: 4 mmol/L (ref 3.5–5.1)
Sodium: 136 mmol/L (ref 135–145)

## 2015-11-19 MED ORDER — CLOPIDOGREL BISULFATE 75 MG PO TABS
75.0000 mg | ORAL_TABLET | Freq: Every day | ORAL | Status: DC
  Administered 2015-11-19 – 2015-11-21 (×3): 75 mg via ORAL
  Filled 2015-11-19 (×3): qty 1

## 2015-11-19 MED ORDER — WHITE PETROLATUM GEL
Status: AC
  Administered 2015-11-19: 06:00:00
  Filled 2015-11-19: qty 1

## 2015-11-19 NOTE — Progress Notes (Signed)
Patient ID: Corey Oneill, male   DOB: 06-20-1936, 80 y.o.   MRN: [id number] 3 Days Post-Op  Subjective: Pt feels much better today.  Still not passing flatus, but states he has no nausea and could eat solid food.  He ambulated well yesterday  Objective: Vital signs in last 24 hours: Temp:  [97.9 F (36.6 C)] 97.9 F (36.6 C) (01/06 0502) Pulse Rate:  [93-94] 93 (01/06 0502) Resp:  [18] 18 (01/06 0502) BP: (121-128)/(70-71) 128/71 mmHg (01/06 0502) SpO2:  [93 %-100 %] 93 % (01/06 0502) Last BM Date: 11/16/15  Intake/Output from previous day: 01/05 0701 - 01/06 0700 In: 2004 [P.O.:1270; I.V.:674] Out: 1250 [Urine:1200; Drains:50] Intake/Output this shift:    PE: Abd: soft, appropriately tender, +BS, JP drain with serous drainage, incisions c/d/i  Lab Results:   Recent Labs  11/17/15 0536 11/18/15 0620  WBC 11.9* 12.4*  HGB 10.7* 10.8*  HCT 32.8* 32.3*  PLT 272 264   BMET  Recent Labs  11/17/15 0536  NA 135  K 4.3  CL 105  CO2 22  GLUCOSE 126*  BUN 6  CREATININE 0.74  CALCIUM 7.9*   PT/INR No results for input(s): LABPROT, INR in the last 72 hours. CMP     Component Value Date/Time   NA 135 11/17/2015 0536   K 4.3 11/17/2015 0536   CL 105 11/17/2015 0536   CO2 22 11/17/2015 0536   GLUCOSE 126* 11/17/2015 0536   BUN 6 11/17/2015 0536   CREATININE 0.74 11/17/2015 0536   CREATININE 0.87 10/12/2015 0934   CALCIUM 7.9* 11/17/2015 0536   PROT 4.9* 11/17/2015 0536   ALBUMIN 2.3* 11/17/2015 0536   AST 20 11/17/2015 0536   ALT 21 11/17/2015 0536   ALKPHOS 42 11/17/2015 0536   BILITOT 0.5 11/17/2015 0536   GFRNONAA >60 11/17/2015 0536   GFRAA >60 11/17/2015 0536   Lipase     Component Value Date/Time   LIPASE 20 11/12/2015 0806       Studies/Results: No results found.  Anti-infectives: Anti-infectives    Start     Dose/Rate Route Frequency Ordered Stop   11/12/15 1730  piperacillin-tazobactam (ZOSYN) IVPB 3.375 g     3.375 g 12.5 mL/hr over  240 Minutes Intravenous 3 times per day 11/12/15 1135     11/12/15 1145  piperacillin-tazobactam (ZOSYN) IVPB 3.375 g     3.375 g 100 mL/hr over 30 Minutes Intravenous  Once 11/12/15 1135 11/12/15 1226       Assessment/Plan   Perforated appendicitis with abscess POD#3 laparoscopic appendectomy---Dr. [name] -mild ileus improved.  Advance to full liquids today only since no flatus yet -mobilize, IS -continue drain(watch for leak/fistula) ID-zosyn D#7/11. WBC pending today CAD-s/p DES August 2016. On plavix(on hold).  Will resume today  Severe Aortic Stenosis-to see Dr. [name]. Apparently aortic valve replacement is not urgent.  Hematuria-urine culture +staph and enterococcus faecalis. PCN sensitive, treated.  FEN-fulls, PO pain meds, saline lock IV VTE prophylaxis--SCD/lovenox Dispo-remain inpatient  LOS: 7 days    Corey Oneill E 11/19/2015, 7:53 AM Pager: [phone]

## 2015-11-20 NOTE — Progress Notes (Signed)
Occupational Therapy Evaluation Patient Details Name: Corey Oneill MRN: [id number] DOB: 1936/06/17 Today's Date: 11/20/2015    History of Present Illness s/p appendectomy. PMH includes CAD, aortic stenosis   Clinical Impression   Pt admitted with the above diagnoses and presents with below problem list. PTA pt was independent with ADLs. Pt is currently mod I with ADLs. Pt completed household distance functional mobility at mod I level (decreased speed, wide BOS noted). Of note, orthostatic vitals assessed in supine bp 119/67, bpm 85; seated 137/61, bpm 88; standing at 3 minutes 112/73, bpm 93. Pt reported lightheadness only when turning around at EOB otherwise asymptomatic. No further acute OT needs indicated. OT signing off.      Follow Up Recommendations  No OT follow up    Equipment Recommendations  None recommended by OT    Recommendations for Other Services       Precautions / Restrictions Restrictions Weight Bearing Restrictions: No      Mobility Bed Mobility Overal bed mobility: Modified Independent                Transfers Overall transfer level: Modified independent               General transfer comment: no AD used. increased time    Balance Overall balance assessment: Needs assistance Sitting-balance support: Feet supported Sitting balance-Leahy Scale: Normal     Standing balance support: No upper extremity supported;During functional activity Standing balance-Leahy Scale: Good Standing balance comment: no LOB or swaying. Wide BOS noted.                             ADL Overall ADL's : Modified independent                                       General ADL Comments: Educated on strategies for ADLs to minimize abdominal pain. Completed household distance functional mobility including headturns and quick turn. Educated on sitting for a minute on side of bed before standing and waiting a momoent before walking.       Vision     Perception     Praxis      Pertinent Vitals/Pain       Hand Dominance     Extremity/Trunk Assessment Upper Extremity Assessment Upper Extremity Assessment: Overall WFL for tasks assessed   Lower Extremity Assessment Lower Extremity Assessment: Overall WFL for tasks assessed       Communication Communication Communication: No difficulties   Cognition Arousal/Alertness: Awake/alert Behavior During Therapy: WFL for tasks assessed/performed Overall Cognitive Status: Within Functional Limits for tasks assessed                     General Comments    Pt reports intermittent lightheadedness when OOB. Pt experienced this when turning around suddenly beside the bed. Pt orthostatic coming out of bed.    Exercises       Shoulder Instructions      Home Living Family/patient expects to be discharged to:: Private residence Living Arrangements: Spouse/significant other Available Help at Discharge: Family;Available PRN/intermittently;Other (Comment) (spouse works part-time) Type of Home: Other(Comment) (condo) Home Access: Level entry     [place]: One level     Bathroom Shower/Tub: Gaffer  Prior Functioning/Environment Level of Independence: Independent        Comments: self-employed, Archivist    OT Diagnosis: Generalized weakness   OT Problem List:     OT Treatment/Interventions:      OT Goals(Current goals can be found in the care plan section) Acute Rehab OT Goals Patient Stated Goal: home soon  OT Frequency:     Barriers to D/C:            Co-evaluation              End of Session Nurse Communication: Other (comment) (orthostatic reading)  Activity Tolerance: Patient tolerated treatment well Patient left: in chair;with call bell/phone within reach   Time: 1416-1445 OT Time Calculation (min): 29 min Charges:  OT General Charges $OT Visit: 1 Procedure OT  Evaluation $OT Eval Low Complexity: 1 Procedure OT Treatments $Self Care/Home Management : 8-22 mins G-Codes:    Hortencia Pilar 12-01-2015, 3:13 PM

## 2015-11-20 NOTE — Progress Notes (Signed)
Significant amount of drainage around JP on assessment one hole of tubing outside skin.

## 2015-11-20 NOTE — Discharge Instructions (Signed)
[facility] ______[facility] °LAPAROSCOPIC SURGERY: POST OP INSTRUCTIONS °Always review your discharge instruction sheet given to you by the facility where your surgery was performed. °IF YOU HAVE DISABILITY OR FAMILY LEAVE FORMS, YOU MUST BRING THEM TO THE OFFICE FOR PROCESSING.   °DO NOT GIVE THEM TO YOUR DOCTOR. ° °1. A prescription for pain medication may be given to you upon discharge.  Take your pain medication as prescribed, if needed.  If narcotic pain medicine is not needed, then you may take acetaminophen (Tylenol) or ibuprofen (Advil) as needed. °2. Take your usually prescribed medications unless otherwise directed. °3. If you need a refill on your pain medication, please contact your pharmacy.  They will contact our office to request authorization. Prescriptions will not be filled after 5pm or on week-ends. °4. You should follow a light diet the first few days after arrival home, such as soup and crackers, etc.  Be sure to include lots of fluids daily. °5. Most patients will experience some swelling and bruising in the area of the incisions.  Ice packs will help.  Swelling and bruising can take several days to resolve.  °6. It is common to experience some constipation if taking pain medication after surgery.  Increasing fluid intake and taking a stool softener (such as Colace) will usually help or prevent this problem from occurring.  A mild laxative (Milk of Magnesia or Miralax) should be taken according to package instructions if there are no bowel movements after 48 hours. °7. Unless discharge instructions indicate otherwise, you may remove your bandages 24-48 hours after surgery, and you may shower at that time.  You may have steri-strips (small skin tapes) in place directly over the incision.  These strips should be left on the skin for 7-10 days.  If your surgeon used skin glue on the incision, you may shower in 24 hours.  The glue will flake off over the next 2-3 weeks.  Any sutures or  staples will be removed at the office during your follow-up visit. °8. ACTIVITIES:  You may resume regular (light) daily activities beginning the next day--such as daily self-care, walking, climbing stairs--gradually increasing activities as tolerated.  You may have sexual intercourse when it is comfortable.  Refrain from any heavy lifting or straining until approved by your doctor. °a. You may drive when you are no longer taking prescription pain medication, you can comfortably wear a seatbelt, and you can safely maneuver your car and apply brakes. °b. RETURN TO WORK:  __________________________________________________________ °9. You should see your doctor in the office for a follow-up appointment approximately 2-3 weeks after your surgery.  Make sure that you call for this appointment within a day or two after you arrive home to insure a convenient appointment time. °10. OTHER INSTRUCTIONS: __________________________________________________________________________________________________________________________ __________________________________________________________________________________________________________________________ °WHEN TO CALL YOUR DOCTOR: °1. Fever over 101.0 °2. Inability to urinate °3. Continued bleeding from incision. °4. Increased pain, redness, or drainage from the incision. °5. Increasing abdominal pain ° °The clinic staff is available to answer your questions during regular business hours.  Please don’t hesitate to call and ask to speak to one of the nurses for clinical concerns.  If you have a medical emergency, go to the nearest emergency room or call 911.  A surgeon from [facility] is always on call at the hospital. °[address], [address], [place], [place]  [address] ? [address] [address], [place], [place]   [address] °[phone] ? [phone] ? FAX [phone] °Web site:   [email or web address] °

## 2015-11-20 NOTE — Progress Notes (Signed)
Patient ID: Corey Oneill, male   DOB: 12/06/1935, 80 y.o.   MRN: [id number] Patient ID: Corey Oneill, male   DOB: Aug 09, 1936, 80 y.o.   MRN: [id number] 4 Days Post-Op  Subjective: Feels about the same today. Slight nausea occasionally been once more to eat. Some right lower quadrant pain relieved with oral meds. Says he was somewhat unsteady when getting up to walk this morning. Small loose bowel movements.  Objective: Vital signs in last 24 hours: Temp:  [97.6 F (36.4 C)-98.6 F (37 C)] 98.6 F (37 C) (01/07 0527) Pulse Rate:  [81-87] 81 (01/07 0527) Resp:  [18-19] 19 (01/07 0527) BP: (109-132)/(56-78) 109/56 mmHg (01/07 0527) SpO2:  [95 %-100 %] 100 % (01/07 0527) Last BM Date: 11/16/15  Intake/Output from previous day: 01/06 0701 - 01/07 0700 In: 1914.2 [P.O.:840; I.V.:974.2; IV Piggyback:100] Out: 810 [Urine:775; Drains:35] Intake/Output this shift: Total I/O In: -  Out: 300 [Urine:300]  PE: Abd: soft, appropriately tender, +BS, JP drain with serous drainage, incisions c/d/i  Lab Results:   Recent Labs  11/18/15 0620 11/19/15 0806  WBC 12.4* 11.0*  HGB 10.8* 11.1*  HCT 32.3* 34.2*  PLT 264 328   BMET  Recent Labs  11/19/15 0806  NA 136  K 4.0  CL 101  CO2 26  GLUCOSE 92  BUN 6  CREATININE 0.85  CALCIUM 8.4*   PT/INR No results for input(s): LABPROT, INR in the last 72 hours. CMP     Component Value Date/Time   NA 136 11/19/2015 0806   K 4.0 11/19/2015 0806   CL 101 11/19/2015 0806   CO2 26 11/19/2015 0806   GLUCOSE 92 11/19/2015 0806   BUN 6 11/19/2015 0806   CREATININE 0.85 11/19/2015 0806   CREATININE 0.87 10/12/2015 0934   CALCIUM 8.4* 11/19/2015 0806   PROT 4.9* 11/17/2015 0536   ALBUMIN 2.3* 11/17/2015 0536   AST 20 11/17/2015 0536   ALT 21 11/17/2015 0536   ALKPHOS 42 11/17/2015 0536   BILITOT 0.5 11/17/2015 0536   GFRNONAA >60 11/19/2015 0806   GFRAA >60 11/19/2015 0806   Lipase     Component Value Date/Time   LIPASE 20  11/12/2015 0806       Studies/Results: No results found.  Anti-infectives: Anti-infectives    Start     Dose/Rate Route Frequency Ordered Stop   11/12/15 1730  piperacillin-tazobactam (ZOSYN) IVPB 3.375 g     3.375 g 12.5 mL/hr over 240 Minutes Intravenous 3 times per day 11/12/15 1135     11/12/15 1145  piperacillin-tazobactam (ZOSYN) IVPB 3.375 g     3.375 g 100 mL/hr over 30 Minutes Intravenous  Once 11/12/15 1135 11/12/15 1226       Assessment/Plan   Perforated appendicitis with abscess POD#3 laparoscopic appendectomy---Dr. [name] -mild ileus improved.  Advance to regular diet today -mobilize, IS -continue drain(watch for leak/fistula) ID-zosyn D#8/11. WBC pending today CAD-s/p DES August 2016. On plavix Severe Aortic Stenosis-to see Dr. [name]. Apparently aortic valve replacement is not urgent.  Hematuria-urine culture +staph and enterococcus faecalis. PCN sensitive, treated.  FEN-fulls, PO pain meds, saline lock IV VTE prophylaxis--SCD/lovenox Dispo-remain inpatient.  Ask PT OT to evaluate today prior to discharge due to unsteadiness  LOS: 8 days    Lavon Horn T 11/20/2015, 8:06 AM

## 2015-11-21 LAB — CBC
HCT: 34.1 % — ABNORMAL LOW (ref 39.0–52.0)
Hemoglobin: 11 g/dL — ABNORMAL LOW (ref 13.0–17.0)
MCH: 29.6 pg (ref 26.0–34.0)
MCHC: 32.3 g/dL (ref 30.0–36.0)
MCV: 91.7 fL (ref 78.0–100.0)
PLATELETS: 433 10*3/uL — AB (ref 150–400)
RBC: 3.72 MIL/uL — AB (ref 4.22–5.81)
RDW: 14.1 % (ref 11.5–15.5)
WBC: 7.5 10*3/uL (ref 4.0–10.5)

## 2015-11-21 MED ORDER — AMOXICILLIN-POT CLAVULANATE 875-125 MG PO TABS: 1.0000 | ORAL_TABLET | Freq: Two times a day (BID) | ORAL | Status: DC

## 2015-11-21 MED ORDER — OXYCODONE HCL 5 MG PO TABS: 5.0000 mg | ORAL_TABLET | Freq: Four times a day (QID) | ORAL | Status: DC | PRN

## 2015-11-21 NOTE — Discharge Summary (Signed)
Patient ID: Corey Oneill [id number] 80 y.o. 1936/05/20  11/12/2015  Discharge date and time: 11/21/2015   Admitting Physician: Excell Seltzer T  Discharge Physician: Excell Seltzer T  Admission Diagnoses: Right lower quadrant abdominal pain [R10.31] Acute appendicitis, unspecified acute appendicitis type [K35.80]  Discharge Diagnoses: appendicitis with abscess  Operations: Procedure(s): APPENDECTOMY LAPAROSCOPIC  Admission Condition: fair  Discharged Condition: good  Indication for Admission: patient is a 80 year old male with multiple medical problems admitted with acute right lower quadrant abdominal pain. CT scan showed evidence of acute appendicitis without complication.    Hospital Course: Due to medical problems including aortic stenosis and current use of Plavix after admission by Dr. Donne Hazel decision was made to treat nonoperatively with antibiotics. His Plavix was held for possible surgery. He initially was stable but then after 3-4 days in the hospital showed signs of increasing right lower quadrant pain and tenderness. Decision was made to proceed with surgery and he underwent a laparoscopic appendectomy with findings of a ruptured appendicitis with abscess and fecalith. Postoperatively he was maintained on IV antibiotics. His course was one of gradual improvement. White count steadily decreased. Pain improved. He was started on a liquid diet which he tolerated and this was able to be advanced. A JP drain had been left in place which was serosanguineous. On postop day #5 his white blood count is normal. Tolerating regular diet and having flatus. Abdomen is soft with minimal right lower quadrant tenderness. Drain is removed. He is felt ready for discharge.   Consults: cardiology   Disposition: Home  Patient Instructions:    Medication List    TAKE these medications        acetaminophen 500 MG tablet  Commonly known as:  TYLENOL  Take 500 mg by mouth  daily as needed (pain).     amoxicillin-clavulanate 875-125 MG tablet  Commonly known as:  AUGMENTIN  Take 1 tablet by mouth 2 (two) times daily.     aspirin EC 81 MG tablet  Take 81 mg by mouth daily.     buPROPion 100 MG 12 hr tablet  Commonly known as:  WELLBUTRIN SR  Take 100 mg by mouth daily.     clopidogrel 75 MG tablet  Commonly known as:  PLAVIX  Take 1 tablet (75 mg total) by mouth daily.     ferrous fumarate 325 (106 Fe) MG Tabs tablet  Commonly known as:  HEMOCYTE - 106 mg FE  Take 1 tablet (106 mg of iron total) by mouth daily.     FLUoxetine 10 MG capsule  Commonly known as:  PROZAC  Take 1 capsule (10 mg total) by mouth daily.     Magnesium 250 MG Tabs  Take 250 mg by mouth daily.     nitroGLYCERIN 0.4 MG SL tablet  Commonly known as:  NITROSTAT  Place 0.4 mg under the tongue every 5 (five) minutes as needed for chest pain.     oxyCODONE 5 MG immediate release tablet  Commonly known as:  Oxy IR/ROXICODONE  Take 1-2 tablets (5-10 mg total) by mouth every 6 (six) hours as needed for moderate pain, severe pain or breakthrough pain.     rosuvastatin 10 MG tablet  Commonly known as:  CRESTOR  Take 1 tablet (10 mg total) by mouth daily.        Activity: activity as tolerated Diet: regular diet Wound Care: none needed  Follow-up:  With [facility] surgery in 2 weeks.  Signed: Edward Jolly MD, FACS  11/21/2015, 8:02 AM

## 2015-11-21 NOTE — Progress Notes (Signed)
Patient ID: Corey Oneill, male   DOB: 09/29/1936, 80 y.o.   MRN: [id number] 5 Days Post-Op  Subjective: Gradually feeling better. Still a little bit of pain. Passing flatus. Tolerating regular diet. He was felt to be independent by OT  Objective: Vital signs in last 24 hours: Temp:  [97.5 F (36.4 C)-98 F (36.7 C)] 97.5 F (36.4 C) (01/08 0525) Pulse Rate:  [78-83] 83 (01/08 0525) Resp:  [16-19] 19 (01/08 0525) BP: (113-123)/(68-74) 123/70 mmHg (01/08 0525) SpO2:  [100 %] 100 % (01/08 0525) Last BM Date: 11/16/15  Intake/Output from previous day: 01/07 0701 - 01/08 0700 In: 450 [P.O.:450] Out: 670 [Urine:650; Drains:20] Intake/Output this shift:    General appearance: alert, cooperative and mild distress GI: normal findings: very mild tenderness right lower quadrant improved from yesterday. Nondistended. JP drainage is serous. Incision/Wound: clean and dry  Lab Results:   Recent Labs  11/19/15 0806 11/21/15 0540  WBC 11.0* 7.5  HGB 11.1* 11.0*  HCT 34.2* 34.1*  PLT 328 433*   BMET  Recent Labs  11/19/15 0806  NA 136  K 4.0  CL 101  CO2 26  GLUCOSE 92  BUN 6  CREATININE 0.85  CALCIUM 8.4*     Studies/Results: No results found.  Anti-infectives: Anti-infectives    Start     Dose/Rate Route Frequency Ordered Stop   11/12/15 1730  piperacillin-tazobactam (ZOSYN) IVPB 3.375 g     3.375 g 12.5 mL/hr over 240 Minutes Intravenous 3 times per day 11/12/15 1135     11/12/15 1145  piperacillin-tazobactam (ZOSYN) IVPB 3.375 g     3.375 g 100 mL/hr over 30 Minutes Intravenous  Once 11/12/15 1135 11/12/15 1226      Assessment/Plan: s/p Procedure(s): APPENDECTOMY LAPAROSCOPIC Continued improvement. White count normalized. Appears okay for discharge. Will complete course of antibiotics by mouth at home.   LOS: 9 days    Charish Schroepfer T 11/21/2015

## 2015-11-26 ENCOUNTER — Ambulatory Visit (INDEPENDENT_AMBULATORY_CARE_PROVIDER_SITE_OTHER): Payer: Medicare Other | Admitting: Medical

## 2015-11-26 ENCOUNTER — Ambulatory Visit
Admission: RE | Admit: 2015-11-26 | Discharge: 2015-11-26 | Disposition: A | Discharge status: Home / Self Care | Payer: Medicare Other | Source: Ambulatory Visit | Attending: Medical | Admitting: Medical

## 2015-11-26 ENCOUNTER — Encounter: Payer: Self-pay | Admitting: Medical

## 2015-11-26 VITALS — BP 104/68 | HR 86 | Temp 97.5°F | Wt 146.0 lb

## 2015-11-26 DIAGNOSIS — R0789 Other chest pain: Secondary | ICD-10-CM

## 2015-11-26 DIAGNOSIS — Z9049 Acquired absence of other specified parts of digestive tract: Secondary | ICD-10-CM | POA: Diagnosis not present

## 2015-11-26 DIAGNOSIS — R159 Full incontinence of feces: Secondary | ICD-10-CM | POA: Diagnosis not present

## 2015-11-26 DIAGNOSIS — K627 Radiation proctitis: Secondary | ICD-10-CM

## 2015-11-26 DIAGNOSIS — R195 Other fecal abnormalities: Secondary | ICD-10-CM | POA: Diagnosis not present

## 2015-11-26 DIAGNOSIS — R32 Unspecified urinary incontinence: Secondary | ICD-10-CM | POA: Diagnosis not present

## 2015-11-26 DIAGNOSIS — R630 Anorexia: Secondary | ICD-10-CM | POA: Diagnosis not present

## 2015-11-26 DIAGNOSIS — J90 Pleural effusion, not elsewhere classified: Secondary | ICD-10-CM | POA: Diagnosis not present

## 2015-11-26 NOTE — Progress Notes (Addendum)
Subjective: Chief Complaint  Patient presents with  . no appetite    just got off of the liquid diet which he was on for 8 days at the hospital. forces himself to eat but he is never hungry. released from hospital on sunday. seeing a cardiologist wednesday, letting him know about aortic valve surgery. 99% 80 after 2 laps it was 96% 86   Corey Oneill is a 80yo white male with hx/o CAD, aortic stenosis, radiation proctitis and hx/o prostate cancer.   He notes the last few days has had some significant discomfort in his chest.  Today has no pain, took nitroglycerin last night before bed.   Was having this pain the last few days, felt like pressure in chest, pronounced discomfort.  Denies associated nausea, sweats, tingling, SOB.   The pain can be ongoing for the day.  Felt a little wobbly on feet yesterday, but no other dizziness.  Had appendectomy about a week and a half ago, was in hospital 8 days.     Was having decreased appetite prior to the appendectomy.   Has ongoing bladder issues prior to the hospitalization, and had some loose stools recently as well during hospitalization and last few days.   During hospitalization was having incontinence of stool and urine.  Just in recent days has seen some more solid BMs, but this is keeping him at home. Scared to go out in danger of incontinence.  Happens worse in the morning.  Eating BID, using some Boost shakes as well.    Has appt with heart surgeon Wednesday next week and appt with Dr. Redmond School PCP here next Monday, but wanted to come if for check on the chest discomfort.     Past Medical History  Diagnosis Date  . Hyperlipidemia   . Esophageal reflux   . Hx of radiation therapy 04/14/13-06/09/13    prostate 7800 cGy, 40 sessions, seminal vesicles 5600 cGy 40 sessions  . CAD (coronary artery disease)     a.  LHC 8/16: Mid to distal LAD 30%, OM1 40%, proximal mid RCA 40%, distal RCA 60% >> FFR 0.69  >> PCI: 3 x 15 mm Resolute DES  . Aortic stenosis    a. peak to peak gradient by LHC 8/16:  37 mmHg (moderately severe)   . Radiation proctitis   . Family history of adverse reaction to anesthesia     "daughter has PONV"  . Heart murmur   . Anginal pain ([place])   . H/O blood clots     "get them in my stool and urine" (11/12/2015)  . Depression   . Prostate cancer ([place]) dx'd 2014   ROS as in subjective   Objective: BP 104/68 mmHg  Pulse 80  Temp(Src) 97.5 F (36.4 C) (Tympanic)  Wt 146 lb (66.225 kg)  BP Readings from Last 3 Encounters:  11/26/15 104/68  11/21/15 123/70  11/11/15 126/70   Wt Readings from Last 3 Encounters:  11/26/15 146 lb (66.225 kg)  11/12/15 142 lb (64.411 kg)  11/11/15 147 lb 3.2 oz (66.769 kg)   Pulse ox never dropped below 96% walking around the office.  General appearance: alert, no distress, WD/WN Oral cavity: MMM, no lesions Neck: supple, no lymphadenopathy, no thyromegaly, no masses Heart: 3/6 holosystolic murmur heart in bilat upper sternal borders,otherwise normal S1, S2 Lungs: CTA bilaterally, no wheezes, rhonchi, or rales Abdomen: +bs, recent surgical scars noted of mid and right mid abdomen, healing appropriately, mild tenderness RLQ, otherwise soft, non tender, non  distended, no masses, no hepatomegaly, no splenomegaly Pulses: 1+ symmetric, upper and lower extremities, normal cap refill Ext: no edema   Adult ECG Report  Indication: chest discomfort  Rate: 79bpm  Rhythm: normal sinus rhythm and premature atrial contractions (PAC)  QRS Axis: 83 degrees  PR Interval: 135ms  QRS Duration: 19ms  QTc: 468ms  Conduction Disturbances: none  Other Abnormalities: none  Patient's cardiac risk factors are: advanced age (older than 81 for men, 48 for women), hypertension and male gender.  EKG comparison: 10/2015  Narrative Interpretation: no acute change    Assessment: Encounter Diagnoses  Name Primary?  . Chest discomfort Yes  . Radiation proctitis   . Bowel incontinence   . Urinary  incontinence, unspecified incontinence type   . Anorexia   . S/P appendectomy   . Loose stools      Plan: Discussed his symptoms, he has no symptoms today compared to last 2 days.   Lungs are clear, murmur doesn't seem worse than prior, otherwise no major acute findings.   Spoke to superivisn phsyican Dr. Redmond School about his sytmpoms, EKG,  findings.    Will send for CXR.  Reviewed recent hospitalitoin recors.  Advised is worse chest pains over the weekend to go to the ED.

## 2015-11-29 ENCOUNTER — Ambulatory Visit: Payer: Medicare Other | Admitting: Family Medicine

## 2015-12-01 DIAGNOSIS — R531 Weakness: Secondary | ICD-10-CM | POA: Diagnosis not present

## 2015-12-01 DIAGNOSIS — R197 Diarrhea, unspecified: Secondary | ICD-10-CM | POA: Diagnosis not present

## 2015-12-01 DIAGNOSIS — D649 Anemia, unspecified: Secondary | ICD-10-CM | POA: Diagnosis not present

## 2015-12-01 DIAGNOSIS — R1031 Right lower quadrant pain: Secondary | ICD-10-CM | POA: Diagnosis not present

## 2015-12-01 DIAGNOSIS — R5383 Other fatigue: Secondary | ICD-10-CM | POA: Diagnosis not present

## 2015-12-02 ENCOUNTER — Encounter: Payer: Self-pay | Admitting: Cardiothoracic Surgery

## 2015-12-02 ENCOUNTER — Institutional Professional Consult (permissible substitution) (INDEPENDENT_AMBULATORY_CARE_PROVIDER_SITE_OTHER): Payer: Medicare Other | Admitting: Cardiothoracic Surgery

## 2015-12-02 VITALS — BP 109/60 | HR 88 | Resp 20 | Ht 72.0 in | Wt 136.0 lb

## 2015-12-02 DIAGNOSIS — I350 Nonrheumatic aortic (valve) stenosis: Secondary | ICD-10-CM

## 2015-12-02 NOTE — Progress Notes (Signed)
Corey Oneill       [place] [address]             [phone]                    [facility] [place] Medical Record [id number] Date of Birth: 07/11/1936  Referring: [name], MD Primary Care: [name], MD  Chief Complaint:  sob   History of Present Illness:    Corey Oneill 80 y.o. male is seen in the office  today for CAD and aortic stenosis. He was initially seen in May 2016 with exertional angina and moderate aortic stenosis by echo. He underwent cath demonstrating moderate CAD and moderate AS - medical therapy was recommended. He then went on to have recurrent angina.  Repeat cath, FFR, and PCI was done  in August 2016. His symptoms have never really improved with PCI. Repeat cath was done and showed no changes in anatomy. He recently underwent a repeat echo suggestive of severe AS with findings as outlined below.   He was seen as outpatient by Cardiology and referred to consider AVR. He noted of an ache in the center of his chest that occurs with stress and has occurred with brisk walking. He denies dyspnea, orthopnea, or PND. He complains of dizziness  But no  Syncope.   Continues to have some rectal bleeding related to radiation proctitis. The day following his cardiology visit he presented   to [facility] ED 12/30 for evaluation of worsening abdominal pain. He under went appendectomy for Appendicitis perforated with pelvic abscess 11/16/2015.  He continues to have mild right lower quadrant discomfort and is scheduled for follow up ct of abdomen by Dr Earlean Shawl.     Current Activity/ Functional Status:  Patient is independent with mobility/ambulation, transfers, ADL's, IADL's.   Zubrod Score: At the time of surgery this patient's most appropriate activity status/level should be described as: []     0    Normal activity, no symptoms [x]     1    Restricted in physical strenuous activity but ambulatory, able to do out light  work []     2    Ambulatory and capable of self care, unable to do work activities, up and about               >50 % of waking hours                              []     3    Only limited self care, in bed greater than 50% of waking hours []     4    Completely disabled, no self care, confined to bed or chair []     5    Moribund   Past Medical History  Diagnosis Date  . Hyperlipidemia   . Esophageal reflux   . Hx of radiation therapy 04/14/13-06/09/13    prostate 7800 cGy, 40 sessions, seminal vesicles 5600 cGy 40 sessions  . CAD (coronary artery disease)     a.  LHC 8/16: Mid to distal LAD 30%, OM1 40%, proximal mid RCA 40%, distal RCA 60% >> FFR 0.69  >> PCI: 3 x 15 mm Resolute DES  . Aortic stenosis     a. peak to peak gradient by LHC 8/16:  37 mmHg (moderately severe)   . Radiation proctitis   . Family history of  adverse reaction to anesthesia     "daughter has PONV"  . Heart murmur   . Anginal pain ([place])   . H/O blood clots     "get them in my stool and urine" (11/12/2015)  . Depression   . Prostate cancer [facility]) dx'd 2014    Past Surgical History  Procedure Laterality Date  . Nasal fracture surgery      "broken years ago; several ORs to correct it"  . Tonsillectomy    . Prostate biopsy  2014    "needle biopsy"  . Colonoscopy    . Cardiac catheterization N/A 04/21/2015    Procedure: Right/Left Heart Cath and Coronary Angiography;  Surgeon: [name], MD;  Location: [facility];  Service: Cardiovascular;  Laterality: N/A;  . Cardiac catheterization N/A 06/18/2015    Procedure: Intravascular Pressure Wire/FFR Study;  Surgeon: [name], MD;  Location: [place] [facility];  Service: Cardiovascular;  Laterality: N/A;  . Cardiac catheterization N/A 06/18/2015    Procedure: Coronary Stent Intervention;  Surgeon: [name], MD;  Location: [facility];  Service: Cardiovascular;  Laterality: N/A;  . Cardiac catheterization N/A 06/18/2015    Procedure: Right/Left Heart  Cath and Coronary Angiography;  Surgeon: [name], MD;  Location: [place] [facility];  Service: Cardiovascular;  Laterality: N/A;  . Cardiac catheterization N/A 06/23/2015    Procedure: Left Heart Cath and Cors/Grafts Angiography;  Surgeon: [name], MD;  Location: [place] [facility];  Service: Cardiovascular;  Laterality: N/A;  . Nasal septum surgery    . Inguinal hernia repair    . Fracture surgery    . Laparoscopic appendectomy N/A 11/16/2015    Procedure: APPENDECTOMY LAPAROSCOPIC;  Surgeon: [name], MD;  Location: [facility] OR;  Service: General;  Laterality: N/A;    Family History  Problem Relation Age of Onset  . Brain cancer Father   . Depression Daughter   . Lymphoma Mother     Social History   Social History  . Marital Status: Married    Spouse Name: N/A  . Number of Children: 2  . Years of Education: N/A   Occupational History  . Not on file.   Social History Main Topics  . Smoking status: Former Smoker -- 1.00 packs/day    Types: Cigarettes  . Smokeless tobacco: Never Used     Comment: "quit smoking cigarettes in the 1960s; quit smoking cigars in the 1990s  . Alcohol Use: 25.8 oz/week    14 Glasses of wine, 14 Cans of beer, 15 Standard drinks or equivalent per week  . Drug Use: No  . Sexual Activity: No   Other Topics Concern  . Not on file   Social History Narrative    History  Smoking status  . Former Smoker -- 1.00 packs/day  . Types: Cigarettes  Smokeless tobacco  . Never Used    Comment: "quit smoking cigarettes in the 1960s; quit smoking cigars in the 1990s    History  Alcohol Use  . 25.8 oz/week  . 14 Glasses of wine, 14 Cans of beer, 15 Standard drinks or equivalent per week     No Known Allergies  Current Outpatient Prescriptions  Medication Sig Dispense Refill  . acetaminophen (TYLENOL) 500 MG tablet Take 500 mg by mouth daily as needed (pain). Reported on 11/26/2015    . amoxicillin-clavulanate (AUGMENTIN) 875-125 MG tablet  Take 1 tablet by mouth 2 (two) times daily. 10 tablet 3  . aspirin  EC 81 MG tablet Take 81 mg by mouth daily.    [name] buPROPion (WELLBUTRIN SR) 100 MG 12 hr tablet Take 100 mg by mouth daily.     . clopidogrel (PLAVIX) 75 MG tablet Take 1 tablet (75 mg total) by mouth daily. 30 tablet 9  . dexlansoprazole (DEXILANT) 60 MG capsule Take 60 mg by mouth daily.    . ferrous fumarate (HEMOCYTE - 106 MG FE) 325 (106 FE) MG TABS tablet Take 1 tablet (106 mg of iron total) by mouth daily. 30 each 0  . FLUoxetine (PROZAC) 10 MG capsule Take 1 capsule (10 mg total) by mouth daily. 90 capsule 3  . Magnesium 250 MG TABS Take 250 mg by mouth daily.     . nitroGLYCERIN (NITROSTAT) 0.4 MG SL tablet Place 0.4 mg under the tongue every 5 (five) minutes as needed for chest pain. Reported on 11/26/2015    . oxyCODONE (OXY IR/ROXICODONE) 5 MG immediate release tablet Take 1-2 tablets (5-10 mg total) by mouth every 6 (six) hours as needed for moderate pain, severe pain or breakthrough pain. 40 tablet 0  . rosuvastatin (CRESTOR) 10 MG tablet Take 1 tablet (10 mg total) by mouth daily. 90 tablet 3   No current facility-administered medications for this visit.      Review of Systems:     Cardiac Review of Systems: Y or N  Chest Pain [ y   ]  Resting SOB Blue.Reese   ] Exertional SOB  Blue.Reese  ]  Orthopnea [ n ]   Pedal Edema [ n  ]    Palpitations [n  ] Syncope  [n  ]   Presyncope [ y  ]  General Review of Systems: [Y] = yes [  ]=no Constitional: recent weight change [25-30 lb wt loss  ];  Wt loss over the last 3 months [   ] anorexia [  ]; fatigue [  ]; nausea [  ]; night sweats [  ]; fever [  ]; or chills [  ];          Dental: poor dentition[  ]; Last Dentist visit:   Eye : blurred vision [  ]; diplopia [   ]; vision changes [  ];  Amaurosis fugax[  ]; Resp: cough [  ];  wheezing[ n ];  hemoptysis[n  ]; shortness of breath[y  ]; paroxysmal nocturnal dyspnea[  ]; dyspnea on exertion[  ]; or orthopnea[  ];  GI:  gallstones[  ],  vomiting[  ];  dysphagia[  ]; melena[  ];  hematochezia Blue.Reese  ]; heartburn[  ];   Hx of  Colonoscopy[y  ]; GU: kidney stones [  ]; hematuria[  ];   dysuria [  ];  nocturia[  ];  history of     obstruction [  ]; urinary frequency [  ]             Skin: rash, swelling[  ];, hair loss[  ];  peripheral edema[  ];  or itching[  ]; Musculosketetal: myalgias[  ];  joint swelling[  ];  joint erythema[  ];  joint pain[  ];  back pain[  ];  Heme/Lymph: bruising[  ];  bleeding[y  ];  anemia[  ];  Neuro: TIA[  ];  headaches[  ];  stroke[  ];  vertigo[  ];  seizures[  ];   paresthesias[  ];  difficulty walking[  ];  Psych:depression[  ]; anxiety[  ];  Endocrine: diabetes[  ];  thyroid dysfunction[  ];  Immunizations: Flu up to date [  ]; Pneumococcal up to date [  ];  Other:  Physical Exam: BP 109/60 mmHg  Pulse 88  Resp 20  Ht 6' (1.829 m)  Wt 136 lb (61.689 kg)  BMI 18.44 kg/m2  SpO2 97%  PHYSICAL EXAMINATION: General appearance: alert, cooperative, appears older than stated age and cachectic Head: Normocephalic, without obvious abnormality, atraumatic Neck: no adenopathy, no carotid bruit, no JVD, supple, symmetrical, trachea midline and thyroid not enlarged, symmetric, no tenderness/mass/nodules,no obvious dental problemsI di Lymph nodes: Cervical, supraclavicular, and axillary nodes normal. Resp: clear to auscultation bilaterally Back: symmetric, no curvature. ROM normal. No CVA tenderness. Cardio: systolic murmur: holosystolic 3/6, crescendo at 2nd left intercostal space GI: soft, mild right lower quadrant tenderness, even when push on the left. non-tender; bowel sounds normal; no masses,  no organomegaly Extremities: extremities normal, atraumatic, no cyanosis or edema Neurologic: Grossly normal  Diagnostic Studies & Laboratory data:     Recent Radiology Findings:   Dg Chest 2 View  11/26/2015  CLINICAL DATA:  Chest pain. EXAM: CHEST  2 VIEW COMPARISON:  06/22/2015. FINDINGS: Calcified  left hilar lymph nodes are again noted. Lungs are clear. Heart size normal. Small right pleural effusion. No pneumothorax. No acute bony abnormality. IMPRESSION: Small right pleural effusion.  No acute pulmonary infiltrate. Electronically Signed   By: [name]  Register   On: 11/26/2015 15:09   Ct Abdomen Pelvis W Contrast  11/15/2015  CLINICAL DATA:  Patient with history of acute appendicitis. Right lower quadrant pain since Tuesday. Followup evaluation. EXAM: CT ABDOMEN AND PELVIS WITH CONTRAST TECHNIQUE: Multidetector CT imaging of the abdomen and pelvis was performed using the standard protocol following bolus administration of intravenous contrast. CONTRAST:  139mL OMNIPAQUE IOHEXOL 300 MG/ML  SOLN COMPARISON:  CT abdomen pelvis 11/12/2015. FINDINGS: Lower chest: Normal heart size. Coronary arterial vascular calcifications. Aortic valve calcifications. Dependent atelectasis within the bilateral lower lobes. Small right pleural effusion. Hepatobiliary: Interval increase in small amount of perihepatic ascites. Liver is normal in size and contour. Too small to characterize low-attenuation lesion right hepatic lobe, stable. Gallbladder is decompressed. Pancreas: Unremarkable Spleen: Unremarkable Adrenals/Urinary Tract: The adrenal glands are normal. Kidneys enhance symmetrically with contrast. No hydronephrosis. Urinary bladder is unremarkable. Stomach/Bowel: Oral contrast material is demonstrated to the distal small bowel. There a few prominent loops of distal small bowel measuring up to 3.4 cm within the central abdomen and right hemi abdomen. Re- demonstrated dilatation of the appendix containing multiple appendicoliths, compatible with acute appendicitis. There is marked wall thickening of the cecum. Additionally there is wall thickening of the adjacent distal ileum. While lack of contrast within the bowel at this location limits evaluation, there is no definite evidence for perforation. Overall cecum and  distal ileal bowel wall thickening as well as surrounding inflammatory change appears worse when compared to prior exam. Vascular/Lymphatic: Normal caliber abdominal aorta. Peripheral calcified atherosclerotic plaque. No retroperitoneal lymphadenopathy. Other: None. Musculoskeletal: No aggressive or acute appearing osseous lesions. IMPRESSION: Re- demonstrated dilatation and wall thickening of appendix compatible with acute appendicitis. Additionally there is wall thickening and surrounding inflammatory stranding about the distal ileum and cecum, adjacent to the appendix. When compared to prior CT, there appears to be worsening inflammatory change involving both the cecum and distal ileum. No oral contrast material is located at this level of the bowel, limiting evaluation for small perforation however there is no definite evidence for frank perforation as would be seen in the  setting of free intraperitoneal air or large abscess. Interval increase in perihepatic and perisplenic ascites. There are prominent loops of distal small bowel within the central in right hemiabdomen likely secondary to ileus from inflammatory change. Electronically Signed   By: Lovey Newcomer M.D.   On: 11/15/2015 13:16   Ct Abdomen Pelvis W Contrast  11/12/2015  CLINICAL DATA:  Right lower quadrant pain increasing over the past 4 days, initial encounter EXAM: CT ABDOMEN AND PELVIS WITH CONTRAST TECHNIQUE: Multidetector CT imaging of the abdomen and pelvis was performed using the standard protocol following bolus administration of intravenous contrast. CONTRAST:  20mL OMNIPAQUE IOHEXOL 300 MG/ML  SOLN COMPARISON:  07/14/2015 FINDINGS: Lung bases are free of acute infiltrate or sizable effusion. Multiple calcified hilar lymph nodes are noted consistent prior granulomatous disease. Mild coronary calcifications are seen. The liver, gallbladder, spleen, adrenal glands and pancreas are normal in their CT appearance with the exception of a  solitary cyst in the liver on image number 48 of series 2. Cystic changes are noted within the kidneys bilaterally. No renal calculi or obstructive changes are noted. The right lower quadrant a considerable amount of inflammatory change is noted. There are 2 appendicoliths identified within a dilated and inflamed appendix consistent with ascites. Inflammatory change extends into the cecum. No perforation or abscess is identified at this time. The bony structures show mild degenerative change of the lumbar spine. IMPRESSION: Changes consistent with appendicitis without perforation. Changes of prior granulomatous disease. Electronically Signed   By: Inez Catalina M.D.   On: 11/12/2015 11:00     I have independently reviewed the above radiologic studies.  Recent Lab Findings: Lab Results  Component Value Date   WBC 7.5 11/21/2015   HGB 11.0* 11/21/2015   HCT 34.1* 11/21/2015   PLT 433* 11/21/2015   GLUCOSE 92 11/19/2015   CHOL 155 06/18/2015   TRIG 99 06/18/2015   HDL 49 06/18/2015   LDLCALC 86 06/18/2015   ALT 21 11/17/2015   AST 20 11/17/2015   NA 136 11/19/2015   K 4.0 11/19/2015   CL 101 11/19/2015   CREATININE 0.85 11/19/2015   BUN 6 11/19/2015   CO2 26 11/19/2015   TSH 5.87* 07/23/2015   INR 1.24 06/17/2015   ECHO: 11/01/2015 LV EF: 55% -  60%  ------------------------------------------------------------------- Indications:   (I35.0).  ------------------------------------------------------------------- History:  PMH: Acquired from the patient and from the patient&'s chart. Coronary artery disease. Aortic stenosis. Risk factors: Dyslipidemia.  ------------------------------------------------------------------- Study Conclusions  - Left ventricle: The cavity size was normal. There was mild concentric hypertrophy. Systolic function was normal. The estimated ejection fraction was in the range of 55% to 60%. Wall motion was normal; there were no regional wall  motion abnormalities. Doppler parameters are consistent with abnormal left ventricular relaxation (grade 1 diastolic dysfunction). - Aortic valve: Valve mobility was restricted. There was severe stenosis. Valve area (Vmax): 0.82 cm^2. - Mitral valve: Moderately calcified annulus. - Right ventricle: The cavity size was normal. Wall thickness was normal. Systolic function was normal. - Tricuspid valve: There was trivial regurgitation. - Inferior vena cava: The vessel was normal in size. The respirophasic diameter changes were in the normal range (>= 50%), consistent with normal central venous pressure. Aortic valve:  Trileaflet; moderately thickened, severely calcified leaflets. Valve mobility was restricted. Doppler: There was severe stenosis.  There was no regurgitation.  VTI ratio of LVOT to aortic valve: 0.22. Valve area (VTI): 0.85 cm^2. Indexed valve area (VTI): 0.46 cm^2/m^2. Peak velocity ratio  of LVOT to aortic valve: 0.21. Valve area (Vmax): 0.82 cm^2. Indexed valve area (Vmax): 0.44 cm^2/m^2. Mean velocity ratio of LVOT to aortic valve: 0.2. Valve area (Vmean): 0.77 cm^2. Indexed valve area (Vmean): 0.42 cm^2/m^2.  Mean gradient (S): 34 mm Hg. Peak gradient (S): 52 mm Hg. Impressions:  - Although peak velocity and mean gradient calculate as moderate, there appears to be severe stenosis of the aortic valve. The calculated valve area indicates severe stenosis. It is likely that the peak gradient was not identified in this study. Study is limited by poor sound transmission.   Cardiac Cath: Conclusion     Widely patent distal right coronary stent.  Diffuse proximal to mid RCA disease with up to 50% narrowing, unchanged from the prior study.  40-50% mid first obtuse marginal unchanged from prior study. Widely patent LAD with diffuse plaquing up to 30-40%.  No new source for angina is identified. The current episode of chest pain if cardiac  could've been related to coronary spasm. No evidence of thrombus is noted.  Left ventriculography was not performed RECOMMENDATION: Continue medical therapy. Consider adding long-acting nitrates if tolerated by blood pressure.  Eligible for discharge at the discretion of the treating team/Dr. Wynonia Lawman    Assessment / Plan:   1/ Moderate to severe symptomatic AS and CAD with stent of PDA  Valve area (Vmax): 0.82 cm^2.   Aortic valve peak velocity, S       361  cm/s   --------- Aortic valve mean velocity, S       274  cm/s   2/ drug-eluting stent placement in the distal right coronary reducing the 60% stenosis to 0% with TIMI grade 3 flow. Vessel occlusion during the procedure reproduced the patient's exertional angina 06/17/2016  Continues to be slow recovering from appendectomy with right lower quadrant discomfort being evaluated by GI- patient notes to have follow up ct of Abdomen pelvis.  I discussed AVR poss CABG with the patient. He is symptomatic with exertional angina. I plan to see him back in two weeks , to evaluate if his   Abdominal discomfort has resolved and review with cardiology need for repeat Cath and potential as TAVR candidate.   I  spent 40 minutes counseling the patient face to face and 50% or more the  time was spent in counseling and coordination of care. The total time spent in the appointment was 60 minutes.  Grace Isaac MD      [address] [place] [address] Office [phone]   Beeper [phone]  12/05/2015 7:38 PM

## 2015-12-02 NOTE — Addendum Note (Signed)
Addended by: Carlena Hurl on: 12/02/2015 02:22 PM   Modules accepted: Orders

## 2015-12-03 ENCOUNTER — Other Ambulatory Visit: Payer: Self-pay | Admitting: Gastroenterology

## 2015-12-03 ENCOUNTER — Other Ambulatory Visit: Payer: Self-pay | Admitting: General Surgery

## 2015-12-03 DIAGNOSIS — R1031 Right lower quadrant pain: Secondary | ICD-10-CM

## 2015-12-04 DIAGNOSIS — R197 Diarrhea, unspecified: Secondary | ICD-10-CM | POA: Diagnosis not present

## 2015-12-08 ENCOUNTER — Ambulatory Visit
Admission: RE | Admit: 2015-12-08 | Discharge: 2015-12-08 | Disposition: A | Discharge status: Home / Self Care | Payer: Medicare Other | Source: Ambulatory Visit | Attending: Gastroenterology | Admitting: Gastroenterology

## 2015-12-08 DIAGNOSIS — R1031 Right lower quadrant pain: Secondary | ICD-10-CM

## 2015-12-08 DIAGNOSIS — K769 Liver disease, unspecified: Secondary | ICD-10-CM

## 2015-12-08 HISTORY — DX: Liver disease, unspecified: K76.9

## 2015-12-08 MED ORDER — IOPAMIDOL (ISOVUE-300) INJECTION 61%: 100.0000 mL | Freq: Once | INTRAVENOUS | Status: DC | PRN

## 2015-12-09 DIAGNOSIS — R1031 Right lower quadrant pain: Secondary | ICD-10-CM | POA: Diagnosis not present

## 2015-12-13 ENCOUNTER — Emergency Department (HOSPITAL_COMMUNITY)
Admission: EM | Admit: 2015-12-13 | Discharge: 2015-12-13 | Disposition: A | Discharge status: Home / Self Care | Payer: Medicare Other | Attending: Emergency Medicine | Admitting: Emergency Medicine

## 2015-12-13 ENCOUNTER — Emergency Department (HOSPITAL_COMMUNITY): Payer: Medicare Other

## 2015-12-13 ENCOUNTER — Encounter (HOSPITAL_COMMUNITY): Payer: Self-pay | Admitting: Emergency Medicine

## 2015-12-13 ENCOUNTER — Telehealth: Payer: Self-pay | Admitting: Cardiovascular Disease

## 2015-12-13 DIAGNOSIS — R079 Chest pain, unspecified: Secondary | ICD-10-CM | POA: Diagnosis not present

## 2015-12-13 DIAGNOSIS — E785 Hyperlipidemia, unspecified: Secondary | ICD-10-CM | POA: Diagnosis not present

## 2015-12-13 DIAGNOSIS — Z792 Long term (current) use of antibiotics: Secondary | ICD-10-CM | POA: Diagnosis not present

## 2015-12-13 DIAGNOSIS — F329 Major depressive disorder, single episode, unspecified: Secondary | ICD-10-CM | POA: Diagnosis not present

## 2015-12-13 DIAGNOSIS — K219 Gastro-esophageal reflux disease without esophagitis: Secondary | ICD-10-CM | POA: Insufficient documentation

## 2015-12-13 DIAGNOSIS — Z8546 Personal history of malignant neoplasm of prostate: Secondary | ICD-10-CM | POA: Insufficient documentation

## 2015-12-13 DIAGNOSIS — Z79899 Other long term (current) drug therapy: Secondary | ICD-10-CM | POA: Diagnosis not present

## 2015-12-13 DIAGNOSIS — Z9889 Other specified postprocedural states: Secondary | ICD-10-CM | POA: Insufficient documentation

## 2015-12-13 DIAGNOSIS — R6883 Chills (without fever): Secondary | ICD-10-CM | POA: Diagnosis not present

## 2015-12-13 DIAGNOSIS — R011 Cardiac murmur, unspecified: Secondary | ICD-10-CM | POA: Diagnosis not present

## 2015-12-13 DIAGNOSIS — Z7982 Long term (current) use of aspirin: Secondary | ICD-10-CM | POA: Diagnosis not present

## 2015-12-13 DIAGNOSIS — R61 Generalized hyperhidrosis: Secondary | ICD-10-CM | POA: Insufficient documentation

## 2015-12-13 DIAGNOSIS — R42 Dizziness and giddiness: Secondary | ICD-10-CM | POA: Diagnosis not present

## 2015-12-13 DIAGNOSIS — R531 Weakness: Secondary | ICD-10-CM | POA: Diagnosis not present

## 2015-12-13 DIAGNOSIS — Z7902 Long term (current) use of antithrombotics/antiplatelets: Secondary | ICD-10-CM | POA: Diagnosis not present

## 2015-12-13 DIAGNOSIS — Z86718 Personal history of other venous thrombosis and embolism: Secondary | ICD-10-CM | POA: Insufficient documentation

## 2015-12-13 DIAGNOSIS — Z87891 Personal history of nicotine dependence: Secondary | ICD-10-CM | POA: Diagnosis not present

## 2015-12-13 DIAGNOSIS — I25119 Atherosclerotic heart disease of native coronary artery with unspecified angina pectoris: Secondary | ICD-10-CM | POA: Diagnosis not present

## 2015-12-13 DIAGNOSIS — M6281 Muscle weakness (generalized): Secondary | ICD-10-CM | POA: Diagnosis not present

## 2015-12-13 LAB — URINE MICROSCOPIC-ADD ON

## 2015-12-13 LAB — URINALYSIS, ROUTINE W REFLEX MICROSCOPIC
BILIRUBIN URINE: NEGATIVE
Glucose, UA: NEGATIVE mg/dL
KETONES UR: NEGATIVE mg/dL
NITRITE: NEGATIVE
Protein, ur: NEGATIVE mg/dL
SPECIFIC GRAVITY, URINE: 1.022 (ref 1.005–1.030)
pH: 6 (ref 5.0–8.0)

## 2015-12-13 LAB — CBC
HCT: 34.1 % — ABNORMAL LOW (ref 39.0–52.0)
Hemoglobin: 11.3 g/dL — ABNORMAL LOW (ref 13.0–17.0)
MCH: 29.5 pg (ref 26.0–34.0)
MCHC: 33.1 g/dL (ref 30.0–36.0)
MCV: 89 fL (ref 78.0–100.0)
PLATELETS: 391 10*3/uL (ref 150–400)
RBC: 3.83 MIL/uL — AB (ref 4.22–5.81)
RDW: 14.3 % (ref 11.5–15.5)
WBC: 10.3 10*3/uL (ref 4.0–10.5)

## 2015-12-13 LAB — BASIC METABOLIC PANEL
Anion gap: 10 (ref 5–15)
BUN: 18 mg/dL (ref 6–20)
CALCIUM: 8.8 mg/dL — AB (ref 8.9–10.3)
CO2: 28 mmol/L (ref 22–32)
CREATININE: 0.82 mg/dL (ref 0.61–1.24)
Chloride: 101 mmol/L (ref 101–111)
Glucose, Bld: 129 mg/dL — ABNORMAL HIGH (ref 65–99)
Potassium: 4.7 mmol/L (ref 3.5–5.1)
SODIUM: 139 mmol/L (ref 135–145)

## 2015-12-13 LAB — LIPASE, BLOOD: LIPASE: 33 U/L (ref 11–51)

## 2015-12-13 LAB — I-STAT TROPONIN, ED
TROPONIN I, POC: 0 ng/mL (ref 0.00–0.08)
TROPONIN I, POC: 0 ng/mL (ref 0.00–0.08)

## 2015-12-13 LAB — TSH: TSH: 3.455 u[IU]/mL (ref 0.350–4.500)

## 2015-12-13 NOTE — ED Notes (Signed)
Patient verbalized understanding of discharge instructions and denies any further needs or questions at this time. VS stable. Patient ambulatory with steady gait. Assisted to ED entrance in wheelchair.   

## 2015-12-13 NOTE — ED Notes (Addendum)
Pt here for increased weakness, fatigue.  Recent surgery with admission and complications. sts some intermittent chest discomfort. sts in the bathroom every fifteen minutes and bowel issues. sts worried about his heart because of hx.

## 2015-12-13 NOTE — Discharge Instructions (Signed)
1. Medications: usual home medications 2. Treatment: rest, drink plenty of fluids 3. Follow Up: please followup with your primary doctor for discussion of your diagnoses and further evaluation after today's visit; please return to the ER for chest pain, shortness of breath, new or worsening symptoms   Fatigue Fatigue is feeling tired all of the time, a lack of energy, or a lack of motivation. Occasional or mild fatigue is often a normal response to activity or life in general. However, long-lasting (chronic) or extreme fatigue may indicate an underlying medical condition. HOME CARE INSTRUCTIONS  Watch your fatigue for any changes. The following actions may help to lessen any discomfort you are feeling:  Talk to your health care provider about how much sleep you need each night. Try to get the required amount every night.  Take medicines only as directed by your health care provider.  Eat a healthy and nutritious diet. Ask your health care provider if you need help changing your diet.  Drink enough fluid to keep your urine clear or pale yellow.  Practice ways of relaxing, such as yoga, meditation, massage therapy, or acupuncture.  Exercise regularly.   Change situations that cause you stress. Try to keep your work and personal routine reasonable.  Do not abuse illegal drugs.  Limit alcohol intake to no more than 1 drink per day for nonpregnant women and 2 drinks per day for men. One drink equals 12 ounces of beer, 5 ounces of wine, or 1 ounces of hard liquor.  Take a multivitamin, if directed by your health care provider. SEEK MEDICAL CARE IF:   Your fatigue does not get better.  You have a fever.   You have unintentional weight loss or gain.  You have headaches.   You have difficulty:   Falling asleep.  Sleeping throughout the night.  You feel angry, guilty, anxious, or sad.   You are unable to have a bowel movement (constipation).   You skin is dry.    Your legs or another part of your body is swollen.  SEEK IMMEDIATE MEDICAL CARE IF:   You feel confused.   Your vision is blurry.  You feel faint or pass out.   You have a severe headache.   You have severe abdominal, pelvic, or back pain.   You have chest pain, shortness of breath, or an irregular or fast heartbeat.   You are unable to urinate or you urinate less than normal.   You develop abnormal bleeding, such as bleeding from the rectum, vagina, nose, lungs, or nipples.  You vomit blood.   You have thoughts about harming yourself or committing suicide.   You are worried that you might harm someone else.    This information is not intended to replace advice given to you by your health care provider. Make sure you discuss any questions you have with your health care provider.   Document Released: 08/27/2007 Document Revised: 11/20/2014 Document Reviewed: 03/03/2014 Elsevier Interactive Patient Education 2016 [employer]  Weakness Weakness is a lack of strength. It may be felt all over the body (generalized) or in one specific part of the body (focal). Some causes of weakness can be serious. You may need further medical evaluation, especially if you are elderly or you have a history of immunosuppression (such as chemotherapy or HIV), kidney disease, heart disease, or diabetes. CAUSES  Weakness can be caused by many different things, including:  Infection.  Physical exhaustion.  Internal bleeding or other blood loss that  results in a lack of red blood cells (anemia).  Dehydration. This cause is more common in elderly people.  Side effects or electrolyte abnormalities from medicines, such as pain medicines or sedatives.  Emotional distress, anxiety, or depression.  Circulation problems, especially severe peripheral arterial disease.  Heart disease, such as rapid atrial fibrillation, bradycardia, or heart failure.  Nervous system disorders, such  as Guillain-Barr syndrome, multiple sclerosis, or stroke. DIAGNOSIS  To find the cause of your weakness, your caregiver will take your history and perform a physical exam. Lab tests or X-rays may also be ordered, if needed. TREATMENT  Treatment of weakness depends on the cause of your symptoms and can vary greatly. HOME CARE INSTRUCTIONS   Rest as needed.  Eat a well-balanced diet.  Try to get some exercise every day.  Only take over-the-counter or prescription medicines as directed by your caregiver. SEEK MEDICAL CARE IF:   Your weakness seems to be getting worse or spreads to other parts of your body.  You develop new aches or pains. SEEK IMMEDIATE MEDICAL CARE IF:   You cannot perform your normal daily activities, such as getting dressed and feeding yourself.  You cannot walk up and down stairs, or you feel exhausted when you do so.  You have shortness of breath or chest pain.  You have difficulty moving parts of your body.  You have weakness in only one area of the body or on only one side of the body.  You have a fever.  You have trouble speaking or swallowing.  You cannot control your bladder or bowel movements.  You have black or bloody vomit or stools. MAKE SURE YOU:  Understand these instructions.  Will watch your condition.  Will get help right away if you are not doing well or get worse.   This information is not intended to replace advice given to you by your health care provider. Make sure you discuss any questions you have with your health care provider.   Document Released: 10/30/2005 Document Revised: 04/30/2012 Document Reviewed: 12/29/2011 Elsevier Interactive Patient Education [employer].

## 2015-12-13 NOTE — Telephone Encounter (Signed)
Please call asap,lightheaded and sweating. Should he come here or go to the ER?

## 2015-12-13 NOTE — Telephone Encounter (Signed)
Patient is experiencing chills, sweating and lightheadedness. He states he has fallen twice in last couple weeks, no injuries. "I was able to catch myself" He denies chest pain today and his SOB has been unchanged.  "Took the dog for a pretty lengthy walk yesterday".  Uses NTG for chest pain and had taken x2 over the weekend which was effective.  Appendix surgery was 11/16/15.  He had abd/pelvis CT scan ordered by surgery and has now started on antibiotics.  He has a follow up with them on 12/17/15.  Advised patient to contact the surgeon's office to inform of this lightheadedness, chills and sweating, and that I will forward the message to Dr. Burt Knack as well.

## 2015-12-13 NOTE — ED Provider Notes (Signed)
CSN: [id number]     Arrival date & time 12/13/15  1538 History   First MD Initiated Contact with Patient 12/13/15 1720     Chief Complaint  Patient presents with  . Weakness    HPI   Corey Oneill is a 80 y.o. male with a PMH of HLD, CAD, aortic stenosis, prostate cancer who presents to the ED with generalized weakness. He reports today, he was getting dressed, and felt lightheaded and like he was "perspiring." He denies exacerbating or alleviating factors, and states his symptoms resolved spontaneously after 30 minutes. He reports associated chills. He denies fever, chest pain, shortness of breath, abdominal pain, N/V, numbness, paresthesia. Of note, he states he has had difficulty with his incision sites s/p appendectomy, and is currently taking augmentin.  Past Medical History  Diagnosis Date  . Hyperlipidemia   . Esophageal reflux   . Hx of radiation therapy 04/14/13-06/09/13    prostate 7800 cGy, 40 sessions, seminal vesicles 5600 cGy 40 sessions  . CAD (coronary artery disease)     a.  LHC 8/16: Mid to distal LAD 30%, OM1 40%, proximal mid RCA 40%, distal RCA 60% >> FFR 0.69  >> PCI: 3 x 15 mm Resolute DES  . Aortic stenosis     a. peak to peak gradient by LHC 8/16:  37 mmHg (moderately severe)   . Radiation proctitis   . Family history of adverse reaction to anesthesia     "daughter has PONV"  . Heart murmur   . Anginal pain ([place])   . H/O blood clots     "get them in my stool and urine" (11/12/2015)  . Depression   . Prostate cancer [facility]) dx'd 2014   Past Surgical History  Procedure Laterality Date  . Nasal fracture surgery      "broken years ago; several ORs to correct it"  . Tonsillectomy    . Prostate biopsy  2014    "needle biopsy"  . Colonoscopy    . Cardiac catheterization N/A 04/21/2015    Procedure: Right/Left Heart Cath and Coronary Angiography;  Surgeon: [name], MD;  Location: [place] [facility];  Service: Cardiovascular;  Laterality: N/A;  . Cardiac  catheterization N/A 06/18/2015    Procedure: Intravascular Pressure Wire/FFR Study;  Surgeon: [name], MD;  Location: [place] [facility];  Service: Cardiovascular;  Laterality: N/A;  . Cardiac catheterization N/A 06/18/2015    Procedure: Coronary Stent Intervention;  Surgeon: [name], MD;  Location: [place] [facility];  Service: Cardiovascular;  Laterality: N/A;  . Cardiac catheterization N/A 06/18/2015    Procedure: Right/Left Heart Cath and Coronary Angiography;  Surgeon: [name], MD;  Location: [place] [facility];  Service: Cardiovascular;  Laterality: N/A;  . Cardiac catheterization N/A 06/23/2015    Procedure: Left Heart Cath and Cors/Grafts Angiography;  Surgeon: [name], MD;  Location: [place] [facility];  Service: Cardiovascular;  Laterality: N/A;  . Nasal septum surgery    . Inguinal hernia repair    . Fracture surgery    . Laparoscopic appendectomy N/A 11/16/2015    Procedure: APPENDECTOMY LAPAROSCOPIC;  Surgeon: [name], MD;  Location: [facility] OR;  Service: General;  Laterality: N/A;   Family History  Problem Relation Age of Onset  . Brain cancer Father   . Depression Daughter   . Lymphoma Mother    Social History  Substance Use Topics  . Smoking status: Former Smoker -- 1.00 packs/day  Types: Cigarettes  . Smokeless tobacco: Never Used     Comment: "quit smoking cigarettes in the 1960s; quit smoking cigars in the 1990s  . Alcohol Use: 25.8 oz/week    14 Glasses of wine, 14 Cans of beer, 15 Standard drinks or equivalent per week     Review of Systems  Constitutional: Positive for chills and diaphoresis. Negative for fever.  Respiratory: Negative for shortness of breath.   Cardiovascular: Negative for chest pain.  Gastrointestinal: Negative for nausea, vomiting and abdominal pain.  Neurological: Positive for weakness and light-headedness. Negative for dizziness, syncope, numbness and headaches.  All other systems reviewed and are  negative.     Allergies  Review of patient's allergies indicates no known allergies.  Home Medications   Prior to Admission medications   Medication Sig Start Date End Date Taking? Authorizing Provider  acetaminophen (TYLENOL) 500 MG tablet Take 500 mg by mouth at bedtime. Reported on 11/26/2015   Yes Historical Provider, MD  albuterol (PROVENTIL HFA;VENTOLIN HFA) 108 (90 Base) MCG/ACT inhaler Inhale 1-2 puffs into the lungs at bedtime.   Yes Historical Provider, MD  amoxicillin-clavulanate (AUGMENTIN) 500-125 MG tablet Take 1 tablet by mouth 2 (two) times daily. 12/10/15  Yes Historical Provider, MD  aspirin EC 81 MG tablet Take 81 mg by mouth daily.   Yes Historical Provider, MD  buPROPion (WELLBUTRIN SR) 100 MG 12 hr tablet Take 100 mg by mouth daily.    Yes Historical Provider, MD  clopidogrel (PLAVIX) 75 MG tablet Take 1 tablet (75 mg total) by mouth daily. 07/26/15  Yes Burtis Junes, NP  dexlansoprazole (DEXILANT) 60 MG capsule Take 60 mg by mouth daily.   Yes Historical Provider, MD  ferrous fumarate (HEMOCYTE - 106 MG FE) 325 (106 FE) MG TABS tablet Take 1 tablet (106 mg of iron total) by mouth daily. 07/26/15  Yes Burtis Junes, NP  FLUoxetine (PROZAC) 10 MG capsule Take 1 capsule (10 mg total) by mouth daily. 03/22/15  Yes [name], MD  Magnesium 250 MG TABS Take 250 mg by mouth at bedtime.    Yes Historical Provider, MD  nitroGLYCERIN (NITROSTAT) 0.4 MG SL tablet Place 0.4 mg under the tongue every 5 (five) minutes as needed for chest pain. Reported on 11/26/2015   Yes Historical Provider, MD  oxyCODONE (OXY IR/ROXICODONE) 5 MG immediate release tablet Take 1-2 tablets (5-10 mg total) by mouth every 6 (six) hours as needed for moderate pain, severe pain or breakthrough pain. 11/21/15  Yes Excell Seltzer, MD  rosuvastatin (CRESTOR) 10 MG tablet Take 1 tablet (10 mg total) by mouth daily. Patient taking differently: Take 10 mg by mouth at bedtime.  06/29/15  Yes [name],  MD  amoxicillin-clavulanate (AUGMENTIN) 875-125 MG tablet Take 1 tablet by mouth 2 (two) times daily. Patient not taking: Reported on 12/13/2015 11/21/15   Excell Seltzer, MD    BP 113/71 mmHg  Pulse 74  Temp(Src) 98.2 F (36.8 C) (Oral)  Resp 16  SpO2 100% Physical Exam  Constitutional: He is oriented to person, place, and time. He appears well-developed and well-nourished. No distress.  HENT:  Head: Normocephalic and atraumatic.  Right Ear: External ear normal.  Left Ear: External ear normal.  Nose: Nose normal.  Mouth/Throat: Uvula is midline, oropharynx is clear and moist and mucous membranes are normal.  Eyes: Conjunctivae, EOM and lids are normal. Pupils are equal, round, and reactive to light. Right eye exhibits no discharge. Left eye exhibits no discharge.  No scleral icterus.  Neck: Normal range of motion. Neck supple.  Cardiovascular: Normal rate, regular rhythm, intact distal pulses and normal pulses.   Murmur heard.  Systolic murmur is present with a grade of 3/6  Pulmonary/Chest: Effort normal and breath sounds normal. No respiratory distress. He has no wheezes. He has no rales.  Abdominal: Soft. Normal appearance and bowel sounds are normal. He exhibits no distension and no mass. There is no tenderness. There is no rigidity, no rebound and no guarding.  Musculoskeletal: Normal range of motion. He exhibits no edema or tenderness.  Neurological: He is alert and oriented to person, place, and time. He has normal strength. No cranial nerve deficit or sensory deficit.  Skin: Skin is warm, dry and intact. No rash noted. He is not diaphoretic. No erythema. No pallor.  Abdominal incisions appear clean, dry, and intact. No erythema.  Psychiatric: He has a normal mood and affect. His speech is normal and behavior is normal.  Nursing note and vitals reviewed.   ED Course  Procedures (including critical care time)  Labs Review Labs Reviewed  BASIC METABOLIC PANEL - Abnormal;  Notable for the following:    Glucose, Bld 129 (*)    Calcium 8.8 (*)    All other components within normal limits  CBC - Abnormal; Notable for the following:    RBC 3.83 (*)    Hemoglobin 11.3 (*)    HCT 34.1 (*)    All other components within normal limits  URINALYSIS, ROUTINE W REFLEX MICROSCOPIC (NOT AT [facility]) - Abnormal; Notable for the following:    APPearance HAZY (*)    Hgb urine dipstick SMALL (*)    Leukocytes, UA TRACE (*)    All other components within normal limits  URINE MICROSCOPIC-ADD ON - Abnormal; Notable for the following:    Squamous Epithelial / LPF 0-5 (*)    Bacteria, UA RARE (*)    Casts HYALINE CASTS (*)    All other components within normal limits  URINE CULTURE  LIPASE, BLOOD  TSH  I-STAT TROPOININ, ED  I-STAT TROPOININ, ED    Imaging Review Dg Chest 2 View  12/13/2015  CLINICAL DATA:  Chest pain x 1 day EXAM: CHEST  2 VIEW COMPARISON:  11/26/2015 FINDINGS: Mild hyperinflation. Midline trachea. Normal heart size. Calcified nodes in the left hilum are likely related to old granulomatous disease. Right costophrenic angle blunting on the frontal radiograph is similar and favored to be related to pleural thickening. No pneumothorax. Clear lungs. IMPRESSION: No acute cardiopulmonary disease. Electronically Signed   By: Abigail Miyamoto M.D.   On: 12/13/2015 16:20   I have personally reviewed and evaluated these images and lab results as part of my medical decision-making.   EKG Interpretation None      MDM   Final diagnoses:  Generalized weakness  Lightheadedness    80 year old male presents with lightheadedness and one episode of diaphoresis, which occurred today prior to arrival. Also reports generalized weakness. Denies fever, chest pain, shortness of breath, abdominal pain, N/V, numbness, paresthesia. States he has had difficulty with his incision sites s/p appendectomy, and is currently taking augmentin.  Patient is afebrile. Vital signs stable.  Heart RRR. Lungs clear to auscultation bilaterally. Abdomen soft, non-tender, non-distended. Incisions appear clean, dry, and intact. Normal neuro exam with no focal deficit.   CBC negative for leukocytosis, hemoglobin 11.3, which appears stable. BMP unremarkable. Lipase within normal limits. UA with trace leukocytes, rare bacteria, 0-5 WBC. Urine culture  ordered. TSH within normal limits. Troponin negative x 2. CXR no acute cardiopulmonary disease.  Patient discussed with and seen by Dr. Sabra Heck. Patient is non-toxic and well-appearing, feel he is stable for discharge at this time. Patient to follow-up with PCP this week. Return precautions discussed. Patient verbalizes his understanding and is in agreement with plan.  BP 113/71 mmHg  Pulse 74  Temp(Src) 98.2 F (36.8 C) (Oral)  Resp 16  SpO2 100%      Marella Chimes, PA-C 12/14/15 0104  Noemi Chapel, MD 12/14/15 [phone]

## 2015-12-13 NOTE — ED Provider Notes (Signed)
The patient is a 80 year old male, he presents after having some fatigue and weakness and an episode of diaphoresis earlier in the day, this is totally resolved, at this time the patient has no symptoms area and on my exam he has no peripheral edema, clear heart and lung sounds without wheezing rales or rhonchi, normal pulses, no JVD, speaks in full sentences without distress, EKG is totally normal, no signs of ischemia, labs reviewed, renal insufficiency is baseline, the patient was informed of all of his results and is expressed his understanding to the verbal instructions Given. Stable for discharge  Medical screening examination/treatment/procedure(s) were conducted as a shared visit with non-physician practitioner(s) and myself.  I personally evaluated the patient during the encounter.  Clinical Impression:   Final diagnoses:  Generalized weakness  Lightheadedness    ED ECG REPORT  I personally interpreted this EKG   Date: 12/14/2015   Rate: 85  Rhythm: normal sinus rhythm  QRS Axis: normal  Intervals: normal  ST/T Wave abnormalities: normal  Conduction Disutrbances:none  Narrative Interpretation:   Old EKG Reviewed: none available   Noemi Chapel, MD 12/14/15 1825

## 2015-12-14 NOTE — Telephone Encounter (Signed)
Pt was evaluated in the ER last night. Pending appointment with Dr [name] 12/16/15.

## 2015-12-15 LAB — URINE CULTURE: CULTURE: NO GROWTH

## 2015-12-16 ENCOUNTER — Encounter: Payer: Self-pay | Admitting: Cardiothoracic Surgery

## 2015-12-16 ENCOUNTER — Ambulatory Visit (INDEPENDENT_AMBULATORY_CARE_PROVIDER_SITE_OTHER): Payer: Medicare Other | Admitting: Cardiothoracic Surgery

## 2015-12-16 VITALS — BP 136/83 | HR 92 | Resp 20 | Ht 72.0 in | Wt 136.0 lb

## 2015-12-16 DIAGNOSIS — I350 Nonrheumatic aortic (valve) stenosis: Secondary | ICD-10-CM | POA: Diagnosis not present

## 2015-12-16 DIAGNOSIS — I2510 Atherosclerotic heart disease of native coronary artery without angina pectoris: Secondary | ICD-10-CM | POA: Diagnosis not present

## 2015-12-16 NOTE — Progress Notes (Signed)
[address]       Corey Oneill,[place]y One[place] [id number]             [phone]                    Atthew N Queenan Snyder Medical Record [id number] Date of Birth: April 20, 1936  Referring: [name], MD Primary Care: [name], MD  Chief Complaint:  sob   History of Present Illness:    Corey Oneill 80 y.o. male is seen in the office for CAD and aortic stenosis. He was initially seen in May 2016 with exertional angina and moderate aortic stenosis by echo. He underwent cath demonstrating moderate CAD and moderate AS - medical therapy was recommended. He then went on to have recurrent angina.  Repeat cath, FFR, and PCI was done  in August 2016. His symptoms have never really improved with PCI. Repeat cath was done and showed no changes in anatomy. He recently underwent a repeat echo suggestive of severe AS with findings as outlined below.   He was seen as outpatient by Cardiology and referred to consider AVR. He noted of an ache in the center of his chest that occurs with stress and has occurred with brisk walking. He denies dyspnea, orthopnea, or PND. He complains of dizziness  But no  Syncope.   Continues to have some rectal bleeding  And hematuria intermittent related to radiation proctitis. With frequent urination   The day following his cardiology visit he presented   to [facility] ED 12/30 for evaluation of worsening abdominal pain. He under went appendectomy for Appendicitis perforated with pelvic abscess 11/16/2015.  He continues to have mild right lower quadrant discomfort  A follow-up CT scan showed a phlegmon in the right lower quadrant.  He was started on by mouth antibiotics.  Since I saw in 2 weeks ago his right lower quadrant tenderness has improved but not completely resolved.    He read in the newspaper about a outpatient to have acute myocardial infarction at age 89 noted that he had the same symptoms and went to the emergency room several days  ago,  He was discharged from the emergency room without further workup.   Current Activity/ Functional Status:  Patient is independent with mobility/ambulation, transfers, ADL's, IADL's.   Zubrod Score: At the time of surgery this patient's most appropriate activity status/level should be described as: []     0    Normal activity, no symptoms [x]     1    Restricted in physical strenuous activity but ambulatory, able to do out light work []     2    Ambulatory and capable of self care, unable to do work activities, up and about               >50 % of waking hours                              []     3    Only limited self care, in bed greater than 50% of waking hours []     4    Completely disabled, no self care, confined to bed or chair []     5    Moribund   Past Medical History  Diagnosis Date  . Hyperlipidemia   . Esophageal reflux   . Hx of radiation therapy 04/14/13-06/09/13    prostate 7800  cGy, 40 sessions, seminal vesicles 5600 cGy 40 sessions  . CAD (coronary artery disease)     a.  LHC 8/16: Mid to distal LAD 30%, OM1 40%, proximal mid RCA 40%, distal RCA 60% >> FFR 0.69  >> PCI: 3 x 15 mm Resolute DES  . Aortic stenosis     a. peak to peak gradient by LHC 8/16:  37 mmHg (moderately severe)   . Radiation proctitis   . Family history of adverse reaction to anesthesia     "daughter has PONV"  . Heart murmur   . Anginal pain ([place])   . H/O blood clots     "get them in my stool and urine" (11/12/2015)  . Depression   . Prostate cancer [facility]) dx'd 2014    Past Surgical History  Procedure Laterality Date  . Nasal fracture surgery      "broken years ago; several ORs to correct it"  . Tonsillectomy    . Prostate biopsy  2014    "needle biopsy"  . Colonoscopy    . Cardiac catheterization N/A 04/21/2015    Procedure: Right/Left Heart Cath and Coronary Angiography;  Surgeon: [name], MD;  Location: [facility] LAB;  Service: Cardiovascular;  Laterality: N/A;  . Cardiac  catheterization N/A 06/18/2015    Procedure: Intravascular Pressure Wire/FFR Study;  Surgeon: [name], MD;  Location: [place] CV LAB;  Service: Cardiovascular;  Laterality: N/A;  . Cardiac catheterization N/A 06/18/2015    Procedure: Coronary Stent Intervention;  Surgeon: [name], MD;  Location: [place] CV LAB;  Service: Cardiovascular;  Laterality: N/A;  . Cardiac catheterization N/A 06/18/2015    Procedure: Right/Left Heart Cath and Coronary Angiography;  Surgeon: [name], MD;  Location: [place] CV LAB;  Service: Cardiovascular;  Laterality: N/A;  . Cardiac catheterization N/A 06/23/2015    Procedure: Left Heart Cath and Cors/Grafts Angiography;  Surgeon: [name], MD;  Location: [place] CV LAB;  Service: Cardiovascular;  Laterality: N/A;  . Nasal septum surgery    . Inguinal hernia repair    . Fracture surgery    . Laparoscopic appendectomy N/A 11/16/2015    Procedure: APPENDECTOMY LAPAROSCOPIC;  Surgeon: [name], MD;  Location: [facility] OR;  Service: General;  Laterality: N/A;    Family History  Problem Relation Age of Onset  . Brain cancer Father   . Depression Daughter   . Lymphoma Mother     Social History   Social History  . Marital Status: Married    Spouse Name: N/A  . Number of Children: 2  . Years of Education: N/A   Occupational History  . Not on file.   Social History Main Topics  . Smoking status: Former Smoker -- 1.00 packs/day    Types: Cigarettes  . Smokeless tobacco: Never Used     Comment: "quit smoking cigarettes in the 1960s; quit smoking cigars in the 1990s  . Alcohol Use: 25.8 oz/week    14 Glasses of wine, 14 Cans of beer, 15 Standard drinks or equivalent per week  . Drug Use: No  . Sexual Activity: No   Other Topics Concern  . Not on file   Social History Narrative    History  Smoking status  . Former Smoker -- 1.00 packs/day  . Types: Cigarettes  Smokeless tobacco  . Never Used    Comment: "quit smoking  cigarettes in the 1960s; quit smoking cigars in the 1990s    History  Alcohol  Use  . 25.8 oz/week  . 14 Glasses of wine, 14 Cans of beer, 15 Standard drinks or equivalent per week     No Known Allergies  Current Outpatient Prescriptions  Medication Sig Dispense Refill  . acetaminophen (TYLENOL) 500 MG tablet Take 500 mg by mouth at bedtime. Reported on 11/26/2015    . albuterol (PROVENTIL HFA;VENTOLIN HFA) 108 (90 Base) MCG/ACT inhaler Inhale 1-2 puffs into the lungs at bedtime.    [name] amoxicillin-clavulanate (AUGMENTIN) 500-125 MG tablet Take 1 tablet by mouth 2 (two) times daily.    [name] aspirin EC 81 MG tablet Take 81 mg by mouth daily.    [name] buPROPion (WELLBUTRIN SR) 100 MG 12 hr tablet Take 100 mg by mouth daily.     . clopidogrel (PLAVIX) 75 MG tablet Take 1 tablet (75 mg total) by mouth daily. 30 tablet 9  . dexlansoprazole (DEXILANT) 60 MG capsule Take 60 mg by mouth daily.    . ferrous fumarate (HEMOCYTE - 106 MG FE) 325 (106 FE) MG TABS tablet Take 1 tablet (106 mg of iron total) by mouth daily. 30 each 0  . FLUoxetine (PROZAC) 10 MG capsule Take 1 capsule (10 mg total) by mouth daily. 90 capsule 3  . Magnesium 250 MG TABS Take 250 mg by mouth at bedtime.     . nitroGLYCERIN (NITROSTAT) 0.4 MG SL tablet Place 0.4 mg under the tongue every 5 (five) minutes as needed for chest pain. Reported on 11/26/2015    . oxyCODONE (OXY IR/ROXICODONE) 5 MG immediate release tablet Take 1-2 tablets (5-10 mg total) by mouth every 6 (six) hours as needed for moderate pain, severe pain or breakthrough pain. 40 tablet 0  . rosuvastatin (CRESTOR) 10 MG tablet Take 1 tablet (10 mg total) by mouth daily. (Patient taking differently: Take 10 mg by mouth at bedtime. ) 90 tablet 3   No current facility-administered medications for this visit.      Review of Systems:     Cardiac Review of Systems: Y or N  Chest Pain [ y   ]  Resting SOB Blue.Reese   ] Exertional SOB  Blue.Reese  ]  Orthopnea [ n ]   Pedal Edema [ n  ]     Palpitations [n  ] Syncope  [n  ]   Presyncope [ y  ]  General Review of Systems: [Y] = yes [  ]=no Constitional: recent weight change [25-30 lb wt loss  ];  Wt loss over the last 3 months [   ] anorexia [  ]; fatigue [  ]; nausea [  ]; night sweats [  ]; fever [  ]; or chills [  ];          Dental: poor dentition[  ]; Last Dentist visit:   Eye : blurred vision [  ]; diplopia [   ]; vision changes [  ];  Amaurosis fugax[  ]; Resp: cough [  ];  wheezing[ n ];  hemoptysis[n  ]; shortness of breath[y  ]; paroxysmal nocturnal dyspnea[  ]; dyspnea on exertion[  ]; or orthopnea[  ];  GI:  gallstones[  ], vomiting[  ];  dysphagia[  ]; melena[  ];  hematochezia Blue.Reese  ]; heartburn[  ];   Hx of  Colonoscopy[y  ]; GU: kidney stones [  ]; hematuria[  ];   dysuria [  ];  nocturia[  ];  history of     obstruction [  ]; urinary frequency [  ]  Skin: rash, swelling[  ];, hair loss[  ];  peripheral edema[  ];  or itching[  ]; Musculosketetal: myalgias[  ];  joint swelling[  ];  joint erythema[  ];  joint pain[  ];  back pain[  ];  Heme/Lymph: bruising[  ];  bleeding[y  ];  anemia[  ];  Neuro: TIA[  ];  headaches[  ];  stroke[  ];  vertigo[  ];  seizures[  ];   paresthesias[  ];  difficulty walking[  ];  Psych:depression[  ]; anxiety[  ];  Endocrine: diabetes[  ];  thyroid dysfunction[  ];  Immunizations: Flu up to date [  ]; Pneumococcal up to date [  ];  Other:  Physical Exam: BP 136/83 mmHg  Pulse 92  Resp 20  Ht 6' (1.829 m)  Wt 136 lb (61.689 kg)  BMI 18.44 kg/m2  SpO2 95%  PHYSICAL EXAMINATION: General appearance: alert, cooperative, appears older than stated age and cachectic Head: Normocephalic, without obvious abnormality, atraumatic Neck: no adenopathy, no carotid bruit, no JVD, supple, symmetrical, trachea midline and thyroid not enlarged, symmetric, no tenderness/mass/nodules,no obvious dental problemsI di Lymph nodes: Cervical, supraclavicular, and axillary nodes normal. Resp:  clear to auscultation bilaterally Back: symmetric, no curvature. ROM normal. No CVA tenderness. Cardio: systolic murmur: holosystolic 3/6, crescendo at 2nd left intercostal space GI: soft, mild right lower quadrant tenderness, even when push on the left. non-tender; bowel sounds normal; no masses,  no organomegaly Extremities: extremities normal, atraumatic, no cyanosis or edema Neurologic: Grossly normal  Diagnostic Studies & Laboratory data:     Recent Radiology Findings:   Dg Chest 2 View  12/13/2015  CLINICAL DATA:  Chest pain x 1 day EXAM: CHEST  2 VIEW COMPARISON:  11/26/2015 FINDINGS: Mild hyperinflation. Midline trachea. Normal heart size. Calcified nodes in the left hilum are likely related to old granulomatous disease. Right costophrenic angle blunting on the frontal radiograph is similar and favored to be related to pleural thickening. No pneumothorax. Clear lungs. IMPRESSION: No acute cardiopulmonary disease. Electronically Signed   By: Abigail Miyamoto M.D.   On: 12/13/2015 16:20   Dg Chest 2 View  11/26/2015  CLINICAL DATA:  Chest pain. EXAM: CHEST  2 VIEW COMPARISON:  06/22/2015. FINDINGS: Calcified left hilar lymph nodes are again noted. Lungs are clear. Heart size normal. Small right pleural effusion. No pneumothorax. No acute bony abnormality. IMPRESSION: Small right pleural effusion.  No acute pulmonary infiltrate. Electronically Signed   By: [name]  Register   On: 11/26/2015 15:09   Ct Abdomen Pelvis W Contrast  12/08/2015  CLINICAL DATA:  Right lower quadrant pain. Post appendectomy 2 weeks ago. Patient has a history of prostate cancer treated with radiation therapy. EXAM: CT ABDOMEN AND PELVIS WITH CONTRAST TECHNIQUE: Multidetector CT imaging of the abdomen and pelvis was performed using the standard protocol following bolus administration of intravenous contrast. CONTRAST:  98 cc Isovue 300 COMPARISON:  CT of the abdomen pelvis 11/15/2015 FINDINGS: Lower chest: No acute findings.  Coronary artery calcified atherosclerotic disease is seen. Hepatobiliary: There is a stable too small to characterized 8 mm right subcapsular hepatic lesion. Pancreas: No mass, inflammatory changes, or other significant abnormality. Spleen: Within normal limits in size and appearance. Adrenals/Urinary Tract: No evidence of hydronephrosis. There are bilateral hypoattenuated circumscribed renal masses. The 8 mm mass in the inferior pole of the left kidney and 15 mm mostly exophytic mass off of the inferior pole of the right kidney demonstrate intermediate density, and may represent hemorrhagic  cysts. Stomach/Bowel: No evidence of obstruction. There are nonspecific thickening of proximal jejunal loops in the the left upper quadrant of the abdomen, image 26/ sequence 2. There has been a recent appendectomy, with extensive inflammatory mesenteric fat stranding and soft tissue thickening adjacent to the anterior and medial portion of the cecum, the terminal ileum and overlying perineum. There is an irregular hypoattenuated structure immediately adjacent to the appendectomy clips which measures 2.5 x 2.1 x 1.8 cm and may represent a small phlegmon collection. No evidence of free gas or free fluid within the abdomen. Vascular/Lymphatic: No pathologically enlarged lymph nodes. No evidence of abdominal aortic aneurysm. Calcified atherosclerotic disease of the aorta is seen. Reproductive: Surgical clips or radiation seeds are seen within the periphery of the prostate gland. Other: Diffuse subcutaneous fat stranding. Musculoskeletal: There is a stable 7 mm sclerotic focus within L4 vertebral body. Advanced osteoarthritic changes of the lower lumbosacral spine and posterior facet arthropathy are also seen causing narrowing of the bilateral L5-S1 bony neural foramina. 2.5 cm area of coarsened trabeculation in the left iliac bone is stable. IMPRESSION: Status post recent appendectomy with extensive pericecal inflammatory changes  with 2.5 cm probable phlegmon collection within the surgical site. No evidence of free fluid or free gas within the abdomen. Diffuse mild thickening of jejunal loops in the left upper outer quadrant of the abdomen, likely second into enteritis. Stable 8 mm right hepatic lesion. Bilateral renal masses, which may represent hemorrhagic cysts. Electronically Signed   By: Fidela Salisbury M.D.   On: 12/08/2015 18:37     I have independently reviewed the above radiologic studies.  Recent Lab Findings: Lab Results  Component Value Date   WBC 10.3 12/13/2015   HGB 11.3* 12/13/2015   HCT 34.1* 12/13/2015   PLT 391 12/13/2015   GLUCOSE 129* 12/13/2015   CHOL 155 06/18/2015   TRIG 99 06/18/2015   HDL 49 06/18/2015   LDLCALC 86 06/18/2015   ALT 21 11/17/2015   AST 20 11/17/2015   NA 139 12/13/2015   K 4.7 12/13/2015   CL 101 12/13/2015   CREATININE 0.82 12/13/2015   BUN 18 12/13/2015   CO2 28 12/13/2015   TSH 3.455 12/13/2015   INR 1.24 06/17/2015   ECHO: 11/01/2015 LV EF: 55% -  60%  ------------------------------------------------------------------- Indications:   (I35.0).  ------------------------------------------------------------------- History:  PMH: Acquired from the patient and from the patient&'s chart. Coronary artery disease. Aortic stenosis. Risk factors: Dyslipidemia.  ------------------------------------------------------------------- Study Conclusions  - Left ventricle: The cavity size was normal. There was mild concentric hypertrophy. Systolic function was normal. The estimated ejection fraction was in the range of 55% to 60%. Wall motion was normal; there were no regional wall motion abnormalities. Doppler parameters are consistent with abnormal left ventricular relaxation (grade 1 diastolic dysfunction). - Aortic valve: Valve mobility was restricted. There was severe stenosis. Valve area (Vmax): 0.82 cm^2. - Mitral valve: Moderately  calcified annulus. - Right ventricle: The cavity size was normal. Wall thickness was normal. Systolic function was normal. - Tricuspid valve: There was trivial regurgitation. - Inferior vena cava: The vessel was normal in size. The respirophasic diameter changes were in the normal range (>= 50%), consistent with normal central venous pressure. Aortic valve:  Trileaflet; moderately thickened, severely calcified leaflets. Valve mobility was restricted. Doppler: There was severe stenosis.  There was no regurgitation.  VTI ratio of LVOT to aortic valve: 0.22. Valve area (VTI): 0.85 cm^2. Indexed valve area (VTI): 0.46 cm^2/m^2. Peak velocity ratio of LVOT  to aortic valve: 0.21. Valve area (Vmax): 0.82 cm^2. Indexed valve area (Vmax): 0.44 cm^2/m^2. Mean velocity ratio of LVOT to aortic valve: 0.2. Valve area (Vmean): 0.77 cm^2. Indexed valve area (Vmean): 0.42 cm^2/m^2.  Mean gradient (S): 34 mm Hg. Peak gradient (S): 52 mm Hg. Impressions:  - Although peak velocity and mean gradient calculate as moderate, there appears to be severe stenosis of the aortic valve. The calculated valve area indicates severe stenosis. It is likely that the peak gradient was not identified in this study. Study is limited by poor sound transmission.   Cardiac Cath: Conclusion     Widely patent distal right coronary stent.  Diffuse proximal to mid RCA disease with up to 50% narrowing, unchanged from the prior study.  40-50% mid first obtuse marginal unchanged from prior study. Widely patent LAD with diffuse plaquing up to 30-40%.  No new source for angina is identified. The current episode of chest pain if cardiac could've been related to coronary spasm. No evidence of thrombus is noted.  Left ventriculography was not performed RECOMMENDATION: Continue medical therapy. Consider adding long-acting nitrates if tolerated by blood pressure.  Eligible for discharge at the discretion  of the treating team/Dr. Wynonia Lawman     STS Calculation :  risk of mortality 1.58%  Morbidity or mortality  12.5%  Long length of stay 5%  Permanent stroke 1.6%  Prolonged ventilation 6.6%  Renal failure 2.8%  Reoperation 7.5  Short length of stay 45%   Assessment / Plan:   1/ Moderate to severe symptomatic AS and CAD with stent of PDA  Valve area (Vmax): 0.82 cm^2.   Aortic valve peak velocity, S       361  cm/s   --------- Aortic valve mean velocity, S       274  cm/s   STS criteria his risk of mortality with aortic valve replacement coronary artery bypass grafting is approximately 2% morbidity and mortality 12% however this does not take into account the patient's overall physical condition currently in his ongoing medical problems with radiation proctitis and cystitis. I discussed case with Dr. Burt Knack who will see the patient back and consider repeat echocardiogram,  After this we will further evaluate for  TVAR vs  Standard aortic valve replacement.  Is no doubt with the patient's history a tissue valve is indicated.  2/ drug-eluting stent placement in the distal right coronary reducing the 60% stenosis to 0% with TIMI grade 3 flow. Vessel occlusion during the procedure reproduced the patient's exertional angina 06/17/2016  Continues to be slow recovering from appendectomy with right lower quadrant discomfort  Continued phlegmon right lower quadrant currently treated with ampicillin and improving by symptoms,  He has a follow-up appointment with general surgery tomorrow   with a history of active infection continuing in the abdomen I would delay at this point consideration of aortic valve replacement until his GI complaints and processes completely resolved  I plan to see him back in 2 weeks.    Grace Isaac MD      [address] [place] [address] Office [phone]   Beeper [phone]  12/16/2015 9:44 AM

## 2015-12-20 ENCOUNTER — Other Ambulatory Visit: Payer: Self-pay | Admitting: Surgery

## 2015-12-20 DIAGNOSIS — R109 Unspecified abdominal pain: Secondary | ICD-10-CM

## 2015-12-21 ENCOUNTER — Encounter: Payer: Self-pay | Admitting: Family Medicine

## 2015-12-21 ENCOUNTER — Ambulatory Visit (INDEPENDENT_AMBULATORY_CARE_PROVIDER_SITE_OTHER): Payer: Medicare Other | Admitting: Family Medicine

## 2015-12-21 VITALS — BP 122/82 | HR 78 | Wt 139.0 lb

## 2015-12-21 DIAGNOSIS — E43 Unspecified severe protein-calorie malnutrition: Secondary | ICD-10-CM

## 2015-12-21 DIAGNOSIS — Z9049 Acquired absence of other specified parts of digestive tract: Secondary | ICD-10-CM | POA: Diagnosis not present

## 2015-12-21 DIAGNOSIS — F32A Depression, unspecified: Secondary | ICD-10-CM

## 2015-12-21 DIAGNOSIS — I350 Nonrheumatic aortic (valve) stenosis: Secondary | ICD-10-CM | POA: Diagnosis not present

## 2015-12-21 DIAGNOSIS — I208 Other forms of angina pectoris: Secondary | ICD-10-CM | POA: Diagnosis not present

## 2015-12-21 DIAGNOSIS — F329 Major depressive disorder, single episode, unspecified: Secondary | ICD-10-CM | POA: Diagnosis not present

## 2015-12-21 DIAGNOSIS — C61 Malignant neoplasm of prostate: Secondary | ICD-10-CM | POA: Diagnosis not present

## 2015-12-21 NOTE — Progress Notes (Signed)
   Subjective:    Patient ID: Corey Oneill, male    DOB: [birth year], 80 y.o.   MRN: [id number]  HPI He is here for consultation concerning his overall health. He has had a rough last year. In March she was diagnosed with exertional chest pain as well as AS. He had a stent done last year. He was still having difficulty with exertional angina. In January he had an appendectomy and is presently just finishing up antibiotics. He has lost weight and does have  Anorexia.He also complains of recent onset of low back pain that has not improved with use of codeine. He is scheduled to see Dr. Berenice Primas on Saturday for this. He continues to have difficulty urologically mainly with polyuria. He did recently miss an appointment with urology.He is scheduled to have another catheterization prior to a surgery to ensure that there is no need for CABG. He does have difficulty with depression but this point seems to be handling this fairly well. He is taking 2 Boost per dayReviewed he does still feel somewhat weak.   Review of Systems     Objective:   Physical Exam Alert and in no distress. Abdominal exam shows no tenderness to palpation.      Assessment & Plan:  Exertional angina (HCC)  Depression  Prostate cancer ([place])  Protein-calorie malnutrition, severe  S/P appendectomy  Aortic stenosis Discussed his overall care with him. He Is to Increase His Boost to 3 per day. Also lead is much as he can possibly tolerate. He will try and work on getting his strength back. He will follow-up with Dr. Berenice Primas on Saturday. He is scheduled to see cardiology. Recommend he call urology to get another appointment to deal with his polyuria secondary to his prostate cancer.His point the appendectomy is not causing any difficulty. Approximately one half hour spent, greater than 50% in counseling and coordination of care. He will keep in touch with me concerning 's overall care.

## 2015-12-22 ENCOUNTER — Encounter (HOSPITAL_COMMUNITY): Payer: Self-pay | Admitting: Emergency Medicine

## 2015-12-22 ENCOUNTER — Observation Stay (HOSPITAL_COMMUNITY)
Admission: EM | Admit: 2015-12-22 | Discharge: 2015-12-23 | Disposition: A | Discharge status: Home / Self Care | Payer: Medicare Other | Attending: Internal Medicine | Admitting: Internal Medicine

## 2015-12-22 ENCOUNTER — Emergency Department (HOSPITAL_COMMUNITY): Payer: Medicare Other

## 2015-12-22 DIAGNOSIS — I2510 Atherosclerotic heart disease of native coronary artery without angina pectoris: Secondary | ICD-10-CM | POA: Diagnosis present

## 2015-12-22 DIAGNOSIS — M545 Low back pain: Secondary | ICD-10-CM | POA: Diagnosis not present

## 2015-12-22 DIAGNOSIS — G8929 Other chronic pain: Secondary | ICD-10-CM | POA: Diagnosis not present

## 2015-12-22 DIAGNOSIS — F329 Major depressive disorder, single episode, unspecified: Secondary | ICD-10-CM | POA: Insufficient documentation

## 2015-12-22 DIAGNOSIS — E785 Hyperlipidemia, unspecified: Secondary | ICD-10-CM | POA: Diagnosis not present

## 2015-12-22 DIAGNOSIS — Z7902 Long term (current) use of antithrombotics/antiplatelets: Secondary | ICD-10-CM | POA: Diagnosis not present

## 2015-12-22 DIAGNOSIS — I350 Nonrheumatic aortic (valve) stenosis: Secondary | ICD-10-CM

## 2015-12-22 DIAGNOSIS — R0789 Other chest pain: Secondary | ICD-10-CM | POA: Diagnosis not present

## 2015-12-22 DIAGNOSIS — Z79899 Other long term (current) drug therapy: Secondary | ICD-10-CM | POA: Diagnosis not present

## 2015-12-22 DIAGNOSIS — Z87891 Personal history of nicotine dependence: Secondary | ICD-10-CM | POA: Diagnosis not present

## 2015-12-22 DIAGNOSIS — Z7982 Long term (current) use of aspirin: Secondary | ICD-10-CM | POA: Insufficient documentation

## 2015-12-22 DIAGNOSIS — Z923 Personal history of irradiation: Secondary | ICD-10-CM | POA: Insufficient documentation

## 2015-12-22 DIAGNOSIS — Z9049 Acquired absence of other specified parts of digestive tract: Secondary | ICD-10-CM

## 2015-12-22 DIAGNOSIS — Z8546 Personal history of malignant neoplasm of prostate: Secondary | ICD-10-CM | POA: Insufficient documentation

## 2015-12-22 DIAGNOSIS — R079 Chest pain, unspecified: Secondary | ICD-10-CM | POA: Diagnosis not present

## 2015-12-22 DIAGNOSIS — R011 Cardiac murmur, unspecified: Secondary | ICD-10-CM | POA: Insufficient documentation

## 2015-12-22 DIAGNOSIS — Z9889 Other specified postprocedural states: Secondary | ICD-10-CM | POA: Diagnosis not present

## 2015-12-22 DIAGNOSIS — I25729 Atherosclerosis of autologous artery coronary artery bypass graft(s) with unspecified angina pectoris: Secondary | ICD-10-CM | POA: Diagnosis not present

## 2015-12-22 DIAGNOSIS — R634 Abnormal weight loss: Secondary | ICD-10-CM | POA: Insufficient documentation

## 2015-12-22 DIAGNOSIS — K219 Gastro-esophageal reflux disease without esophagitis: Secondary | ICD-10-CM | POA: Diagnosis present

## 2015-12-22 LAB — BASIC METABOLIC PANEL
ANION GAP: 9 (ref 5–15)
BUN: 14 mg/dL (ref 6–20)
CALCIUM: 9.2 mg/dL (ref 8.9–10.3)
CO2: 29 mmol/L (ref 22–32)
Chloride: 101 mmol/L (ref 101–111)
Creatinine, Ser: 0.79 mg/dL (ref 0.61–1.24)
GFR calc Af Amer: >60 mL/min (ref >60)
GFR calc non Af Amer: >60 mL/min (ref >60)
GLUCOSE: 89 mg/dL (ref 65–99)
Potassium: 4 mmol/L (ref 3.5–5.1)
Sodium: 139 mmol/L (ref 135–145)

## 2015-12-22 LAB — CBC
HCT: 36.8 % — ABNORMAL LOW (ref 39.0–52.0)
HEMOGLOBIN: 11.7 g/dL — AB (ref 13.0–17.0)
MCH: 28.9 pg (ref 26.0–34.0)
MCHC: 31.8 g/dL (ref 30.0–36.0)
MCV: 90.9 fL (ref 78.0–100.0)
Platelets: 274 10*3/uL (ref 150–400)
RBC: 4.05 MIL/uL — ABNORMAL LOW (ref 4.22–5.81)
RDW: 15.6 % — ABNORMAL HIGH (ref 11.5–15.5)
WBC: 10.7 10*3/uL — ABNORMAL HIGH (ref 4.0–10.5)

## 2015-12-22 LAB — PROTIME-INR
INR: 1.2 (ref 0.00–1.49)
Prothrombin Time: 15.3 seconds — ABNORMAL HIGH (ref 11.6–15.2)

## 2015-12-22 LAB — TROPONIN I

## 2015-12-22 LAB — I-STAT TROPONIN, ED: TROPONIN I, POC: 0 ng/mL (ref 0.00–0.08)

## 2015-12-22 MED ORDER — ASPIRIN 81 MG PO CHEW
324.0000 mg | CHEWABLE_TABLET | Freq: Once | ORAL | Status: AC
  Administered 2015-12-22: 324 mg via ORAL
  Filled 2015-12-22: qty 4

## 2015-12-22 MED ORDER — MAGNESIUM OXIDE 400 (241.3 MG) MG PO TABS: 200.0000 mg | ORAL_TABLET | Freq: Every day | ORAL | Status: DC

## 2015-12-22 MED ORDER — PANTOPRAZOLE SODIUM 40 MG PO TBEC
40.0000 mg | DELAYED_RELEASE_TABLET | Freq: Every day | ORAL | Status: DC
  Administered 2015-12-23: 40 mg via ORAL
  Filled 2015-12-22: qty 1

## 2015-12-22 MED ORDER — ALBUTEROL SULFATE HFA 108 (90 BASE) MCG/ACT IN AERS: 1.0000 | INHALATION_SPRAY | Freq: Every day | RESPIRATORY_TRACT | Status: DC

## 2015-12-22 MED ORDER — BUPROPION HCL ER (SR) 100 MG PO TB12
100.0000 mg | ORAL_TABLET | Freq: Every day | ORAL | Status: DC
  Administered 2015-12-23: 100 mg via ORAL
  Filled 2015-12-22: qty 1

## 2015-12-22 MED ORDER — ACETAMINOPHEN 500 MG PO TABS: 500.0000 mg | ORAL_TABLET | Freq: Every day | ORAL | Status: DC

## 2015-12-22 MED ORDER — CLOPIDOGREL BISULFATE 75 MG PO TABS
75.0000 mg | ORAL_TABLET | Freq: Every day | ORAL | Status: DC
  Administered 2015-12-23: 75 mg via ORAL
  Filled 2015-12-22: qty 1

## 2015-12-22 MED ORDER — FERROUS FUMARATE 325 (106 FE) MG PO TABS
1.0000 | ORAL_TABLET | Freq: Every day | ORAL | Status: DC
  Filled 2015-12-22: qty 1

## 2015-12-22 MED ORDER — MORPHINE SULFATE (PF) 2 MG/ML IV SOLN
2.0000 mg | Freq: Once | INTRAVENOUS | Status: AC
  Administered 2015-12-22: 2 mg via INTRAVENOUS
  Filled 2015-12-22: qty 1

## 2015-12-22 MED ORDER — NITROGLYCERIN 0.4 MG SL SUBL: 0.4000 mg | SUBLINGUAL_TABLET | SUBLINGUAL | Status: DC | PRN

## 2015-12-22 MED ORDER — ASPIRIN EC 81 MG PO TBEC: 81.0000 mg | DELAYED_RELEASE_TABLET | Freq: Every day | ORAL | Status: DC

## 2015-12-22 MED ORDER — OXYCODONE HCL 5 MG PO TABS: 5.0000 mg | ORAL_TABLET | Freq: Four times a day (QID) | ORAL | Status: DC | PRN

## 2015-12-22 MED ORDER — ROSUVASTATIN CALCIUM 10 MG PO TABS
10.0000 mg | ORAL_TABLET | Freq: Every day | ORAL | Status: DC
  Administered 2015-12-23: 10 mg via ORAL
  Filled 2015-12-22: qty 1

## 2015-12-22 MED ORDER — FLUOXETINE HCL 10 MG PO CAPS
10.0000 mg | ORAL_CAPSULE | Freq: Every day | ORAL | Status: DC
  Administered 2015-12-23: 10 mg via ORAL
  Filled 2015-12-22: qty 1

## 2015-12-22 MED ORDER — NITROGLYCERIN 0.4 MG SL SUBL
0.4000 mg | SUBLINGUAL_TABLET | SUBLINGUAL | Status: DC | PRN
  Filled 2015-12-22: qty 1

## 2015-12-22 NOTE — ED Notes (Signed)
MD at bedside. 

## 2015-12-22 NOTE — H&P (Signed)
Primary Physician:  [employer] Primary Cardiologist:  Burt Knack  Pt presents to ER with CP  HPI: 80yo with history of moderate CAD and AS  Pt had FFR and PCI done in Aug 2016 with continued symptms of angina  REpeat cath showed no change in anatomy  Repeat echo suggest severe AS  Mean gradient 34 mm Hg  Followed by Ezzie Dural and Festus Barren  Seen by Dr [name] on 12/16/15  Plan for f/u in 2 wks    Pt just finished second round of ABX   Today around noon he began having chest discomfort  Has been constant  Lower sternal area.  Worse with inspiration.   Denies cough  No F/C  No N/V or diarrhea  Denies abdominal pain Has eaten some but appetite has been down  Presents to ER with continued pain  8/10 in severity  No change   Given NTG with no change         Past Medical History  Diagnosis Date  . Hyperlipidemia   . Esophageal reflux   . Hx of radiation therapy 04/14/13-06/09/13    prostate 7800 cGy, 40 sessions, seminal vesicles 5600 cGy 40 sessions  . CAD (coronary artery disease)     a.  LHC 8/16: Mid to distal LAD 30%, OM1 40%, proximal mid RCA 40%, distal RCA 60% >> FFR 0.69  >> PCI: 3 x 15 mm Resolute DES  . Aortic stenosis     a. peak to peak gradient by LHC 8/16:  37 mmHg (moderately severe)   . Radiation proctitis   . Family history of adverse reaction to anesthesia     "daughter has PONV"  . Heart murmur   . Anginal pain ([place])   . H/O blood clots     "get them in my stool and urine" (11/12/2015)  . Depression   . Prostate cancer ([place]) dx'd 2014     (Not in a hospital admission)   .  morphine injection  2 mg Intravenous Once    Infusions:    No Known Allergies  Social History   Social History  . Marital Status: Married    Spouse Name: N/A  . Number of Children: 2  . Years of Education: N/A   Occupational History  . Not on file.   Social History Main Topics  . Smoking status: Former Smoker -- 0.00 packs/day    Types: Cigarettes  . Smokeless tobacco:  Never Used     Comment: "quit smoking cigarettes in the 1960s; quit smoking cigars in the 1990s  . Alcohol Use: Yes  . Drug Use: No  . Sexual Activity: Not on file   Other Topics Concern  . Not on file   Social History Narrative    Family History  Problem Relation Age of Onset  . Brain cancer Father   . Depression Daughter   . Lymphoma Mother     REVIEW OF SYSTEMS:  All systems reviewed  Negative to the above problem except as noted above.    PHYSICAL EXAM: Filed Vitals:   12/22/15 2205 12/22/15 2207  BP: 123/75 117/69  Pulse:    Temp:    Resp:      No intake or output data in the 24 hours ending 12/22/15 2240  General:  Well appearing. No respiratory difficulty HEENT: normal Neck: supple. no JVD. Carotids 2+ bilat; Bilateral bruits  . No lymphadenopathy or thryomegaly appreciated. Cor: PMI nondisplaced. Regular rate & rhythm. No rubs, gallops or  Gr II/VI systolic murmur LSB Lungs: clear Abdomen: soft, nontender, nondistended. No hepatosplenomegaly. No bruits or masses. Good bowel sounds. Extremities: no cyanosis, clubbing, rash, edema Neuro: alert & oriented x 3, cranial nerves grossly intact. moves all 4 extremities w/o difficulty. Affect pleasant.  ECG:  SR 95 bpm    Results for orders placed or performed during the hospital encounter of 12/22/15 (from the past 24 hour(s))  I-stat troponin, ED (not at [facility], [facility])     Status: None   Collection Time: 12/22/15  8:34 PM  Result Value Ref Range   Troponin i, poc 0.00 0.00 - 0.08 ng/mL   Comment 3          Basic metabolic panel     Status: None   Collection Time: 12/22/15  8:37 PM  Result Value Ref Range   Sodium 139 135 - 145 mmol/L   Potassium 4.0 3.5 - 5.1 mmol/L   Chloride 101 101 - 111 mmol/L   CO2 29 22 - 32 mmol/L   Glucose, Bld 89 65 - 99 mg/dL   BUN 14 6 - 20 mg/dL   Creatinine, Ser 0.79 0.61 - 1.24 mg/dL   Calcium 9.2 8.9 - 10.3 mg/dL   GFR calc non Af Amer >60 >60 mL/min   GFR calc Af Amer >60 >60  mL/min   Anion gap 9 5 - 15  CBC     Status: Abnormal   Collection Time: 12/22/15  8:37 PM  Result Value Ref Range   WBC 10.7 (H) 4.0 - 10.5 K/uL   RBC 4.05 (L) 4.22 - 5.81 MIL/uL   Hemoglobin 11.7 (L) 13.0 - 17.0 g/dL   HCT 36.8 (L) 39.0 - 52.0 %   MCV 90.9 78.0 - 100.0 fL   MCH 28.9 26.0 - 34.0 pg   MCHC 31.8 30.0 - 36.0 g/dL   RDW 15.6 (H) 11.5 - 15.5 %   Platelets 274 150 - 400 K/uL  Protime-INR - (order if Patient is taking Coumadin / Warfarin)     Status: Abnormal   Collection Time: 12/22/15  8:37 PM  Result Value Ref Range   Prothrombin Time 15.3 (H) 11.6 - 15.2 seconds   INR 1.20 0.00 - 1.49   Dg Chest 2 View  12/22/2015  CLINICAL DATA:  Acute left-sided chest pain. EXAM: CHEST  2 VIEW COMPARISON:  December 13, 2015. FINDINGS: The heart size and mediastinal contours are within normal limits. Both lungs are clear. Hyperexpansion of the lungs is noted. No pneumothorax or pleural effusion is noted. The visualized skeletal structures are unremarkable. IMPRESSION: Hyperexpansion of the lungs. No acute cardiopulmonary abnormality seen. Electronically Signed   By: Marijo Conception, M.D.   On: 12/22/2015 21:15     ASSESSMENT:  1  CP  Pain is atypical for cardiac  COnstant  Worse with inspiration. ON exam, chest and abdomen are nontender.  Lungs are clear.   Trop neg despite 11 hours of continuous pain.  EKG neg WIll Admit   R/O for MI  Check d dimer . MSO4 (low dose )  Continue PPI WIll check amylase, lipase and liver enzymes   Admit to telemetry     2.  Aortic stenosis  Pt being evaluated for surgical vs percutaneous intervention    3.  CAD  Complx  As noted above   Current discomfort is quite atypical  Follow.    4.  GI  Complicated course after perforated appendix.  Just finished ABX  Note he has  a CT sched for Fri (abdomen) ? Ordered by Dr [name].

## 2015-12-22 NOTE — ED Notes (Signed)
Pt up to restroom.  Gait steady and even.  Pt ambulates independently with RN by side

## 2015-12-22 NOTE — ED Notes (Signed)
Pt. reports pain across his chest onset this afternoon , non radiating , pain increases with deep inspirations , denies SOB or nausea , no diaphoresis or fever . Pt. added mild lightheadedness , his cardiologist is DR. Cooper , history of CAD with coronary stent .

## 2015-12-22 NOTE — ED Provider Notes (Signed)
CSN: [id number]     Arrival date & time 12/22/15  2012 History   First MD Initiated Contact with Patient 12/22/15 2128     Chief Complaint  Patient presents with  . Chest Pain   HPI Comments: Patient has 2 drug-eluting stents placed in the distal right coronary.  He has had exertional angina since May 2016.  He had a cath done that showed Mod CAD and Mod Aortic stenosis.  PCI and repeat cath done 06/2015.  No resolution of his anginal symptoms.  He was recently seen by CTS 12/16/15.  Has follow up in less than 2 weeks.   Patient reports that he has had increased chest pain from baseline that is sharp and substernal.  CP began this afternoon.  It was unrelieved by 2 Nitroglycerin tablets.  He notes that he was sitting in his office when it pain started all of the sudden.  Denies SOB, Nausea, vomiting, diaphoresis, no radiation of pain.    Patient is a 80 y.o. male presenting with chest pain.  Chest Pain Associated symptoms: back pain (chronic)   Associated symptoms: no diaphoresis, no dizziness, no fever, no nausea, no shortness of breath and not vomiting    Past Medical History  Diagnosis Date  . Hyperlipidemia   . Esophageal reflux   . Hx of radiation therapy 04/14/13-06/09/13    prostate 7800 cGy, 40 sessions, seminal vesicles 5600 cGy 40 sessions  . CAD (coronary artery disease)     a.  LHC 8/16: Mid to distal LAD 30%, OM1 40%, proximal mid RCA 40%, distal RCA 60% >> FFR 0.69  >> PCI: 3 x 15 mm Resolute DES  . Aortic stenosis     a. peak to peak gradient by LHC 8/16:  37 mmHg (moderately severe)   . Radiation proctitis   . Family history of adverse reaction to anesthesia     "daughter has PONV"  . Heart murmur   . Anginal pain ([place])   . H/O blood clots     "get them in my stool and urine" (11/12/2015)  . Depression   . Prostate cancer [facility]) dx'd 2014   Past Surgical History  Procedure Laterality Date  . Nasal fracture surgery      "broken years ago; several ORs to correct it"  .  Tonsillectomy    . Prostate biopsy  2014    "needle biopsy"  . Colonoscopy    . Cardiac catheterization N/A 04/21/2015    Procedure: Right/Left Heart Cath and Coronary Angiography;  Surgeon: [name], MD;  Location: [place] CV LAB;  Service: Cardiovascular;  Laterality: N/A;  . Cardiac catheterization N/A 06/18/2015    Procedure: Intravascular Pressure Wire/FFR Study;  Surgeon: [name], MD;  Location: [place] CV [facility];  Service: Cardiovascular;  Laterality: N/A;  . Cardiac catheterization N/A 06/18/2015    Procedure: Coronary Stent Intervention;  Surgeon: [name], MD;  Location: [place] [facility];  Service: Cardiovascular;  Laterality: N/A;  . Cardiac catheterization N/A 06/18/2015    Procedure: Right/Left Heart Cath and Coronary Angiography;  Surgeon: [name], MD;  Location: [place] [facility];  Service: Cardiovascular;  Laterality: N/A;  . Cardiac catheterization N/A 06/23/2015    Procedure: Left Heart Cath and Cors/Grafts Angiography;  Surgeon: [name], MD;  Location: [place] [facility];  Service: Cardiovascular;  Laterality: N/A;  . Nasal septum surgery    . Inguinal hernia repair    . Fracture surgery    .  Laparoscopic appendectomy N/A 11/16/2015    Procedure: APPENDECTOMY LAPAROSCOPIC;  Surgeon: [name], MD;  Location[facility];  Service: General;  Laterality: N/A;   Family History  Problem Relation Age of Onset  . Brain cancer Father   . Depression Daughter   . Lymphoma Mother    Social History  Substance Use Topics  . Smoking status: Former Smoker -- 0.00 packs/day    Types: Cigarettes  . Smokeless tobacco: Never Used     Comment: "quit smoking cigarettes in the 1960s; quit smoking cigars in the 1990s  . Alcohol Use: Yes    Review of Systems  Constitutional: Positive for chills and unexpected weight change (30 pound weight loss over the last year. being worked up by PCP). Negative for fever and diaphoresis.  Eyes: Negative for visual  disturbance.  Respiratory: Negative for shortness of breath.   Cardiovascular: Positive for chest pain.  Gastrointestinal: Negative for nausea and vomiting.  Musculoskeletal: Positive for back pain (chronic).  Neurological: Negative for dizziness.   Allergies  Review of patient's allergies indicates no known allergies.  Home Medications   Prior to Admission medications   Medication Sig Start Date End Date Taking? Authorizing Provider  acetaminophen (TYLENOL) 500 MG tablet Take 500 mg by mouth at bedtime. Reported on 11/26/2015   Yes Historical Provider, MD  albuterol (PROVENTIL HFA;VENTOLIN HFA) 108 (90 Base) MCG/ACT inhaler Inhale 1-2 puffs into the lungs at bedtime.   Yes Historical Provider, MD  aspirin EC 81 MG tablet Take 81 mg by mouth daily.   Yes Historical Provider, MD  buPROPion (WELLBUTRIN SR) 100 MG 12 hr tablet Take 100 mg by mouth daily.    Yes Historical Provider, MD  clopidogrel (PLAVIX) 75 MG tablet Take 1 tablet (75 mg total) by mouth daily. 07/26/15  Yes Burtis Junes, NP  dexlansoprazole (DEXILANT) 60 MG capsule Take 60 mg by mouth daily.   Yes Historical Provider, MD  ferrous fumarate (HEMOCYTE - 106 MG FE) 325 (106 FE) MG TABS tablet Take 1 tablet (106 mg of iron total) by mouth daily. 07/26/15  Yes Burtis Junes, NP  FLUoxetine (PROZAC) 10 MG capsule Take 1 capsule (10 mg total) by mouth daily. 03/22/15  Yes [name], MD  Magnesium 250 MG TABS Take 250 mg by mouth at bedtime.    Yes Historical Provider, MD  nitroGLYCERIN (NITROSTAT) 0.4 MG SL tablet Place 0.4 mg under the tongue every 5 (five) minutes as needed for chest pain. Reported on 11/26/2015   Yes Historical Provider, MD  oxyCODONE (OXY IR/ROXICODONE) 5 MG immediate release tablet Take 1-2 tablets (5-10 mg total) by mouth every 6 (six) hours as needed for moderate pain, severe pain or breakthrough pain. 11/21/15  Yes Excell Seltzer, MD  rosuvastatin (CRESTOR) 10 MG tablet Take 1 tablet (10 mg total) by  mouth daily. Patient taking differently: Take 10 mg by mouth at bedtime.  06/29/15  Yes [name], MD   BP 117/69 mmHg  Pulse 94  Temp(Src) 98.4 F (36.9 C) (Oral)  Resp 18  SpO2 100% Physical Exam  Constitutional: He is oriented to person, place, and time. No distress.  Cachetic  HENT:  Head: Normocephalic and atraumatic.  Eyes: EOM are normal. No scleral icterus.  Neck: Normal range of motion.  Cardiovascular: Normal rate, regular rhythm and intact distal pulses.   Murmur (blowing murmur) heard. Pulmonary/Chest: Effort normal and breath sounds normal. No respiratory distress. He has no wheezes. He exhibits no tenderness.  Abdominal: Soft. Bowel sounds are normal. He exhibits no distension. There is no tenderness. There is no guarding.  Abdominal incisions appear clean  Musculoskeletal: Normal range of motion. He exhibits no edema.  Neurological: He is alert and oriented to person, place, and time.  Skin: Skin is warm and dry. He is not diaphoretic.    ED Course  Procedures (including critical care time) Labs Review Labs Reviewed  CBC - Abnormal; Notable for the following:    WBC 10.7 (*)    RBC 4.05 (*)    Hemoglobin 11.7 (*)    HCT 36.8 (*)    RDW 15.6 (*)    All other components within normal limits  PROTIME-INR - Abnormal; Notable for the following:    Prothrombin Time 15.3 (*)    All other components within normal limits  BASIC METABOLIC PANEL  TROPONIN I  Randolm Idol, ED   Imaging Review Dg Chest 2 View  12/22/2015  CLINICAL DATA:  Acute left-sided chest pain. EXAM: CHEST  2 VIEW COMPARISON:  December 13, 2015. FINDINGS: The heart size and mediastinal contours are within normal limits. Both lungs are clear. Hyperexpansion of the lungs is noted. No pneumothorax or pleural effusion is noted. The visualized skeletal structures are unremarkable. IMPRESSION: Hyperexpansion of the lungs. No acute cardiopulmonary abnormality seen. Electronically Signed   By: Marijo Conception, M.D.   On: 12/22/2015 21:15   I have personally reviewed and evaluated these images and lab results as part of my medical decision-making.   EKG Interpretation   Date/Time:  Wednesday December 22 2015 22:04:24 EST Ventricular Rate:  95 PR Interval:  163 QRS Duration: 93 QT Interval:  361 QTC Calculation: 454 R Axis:   81 Text Interpretation:  Sinus rhythm Borderline right axis deviation No  significant change was found Confirmed by Wyvonnia Dusky  MD, STEPHEN ([id number]) on  12/22/2015 10:13:40 PM      MDM   Final diagnoses:  None   2204: EKG repeated, unchanged.  Hold Nitro for soft BP.  Administer ASA 325mg .  Cardiology called for consult  2237: Discussed patient with Dr Harrington Challenger, Cardiologist.  She recommends 2mg  of Morphine and will come to evaluate patient.  Work up negative thus far.  Serum trop negative.  2317: Dr Harrington Challenger to admit patient to cards service.   Corey Oneill is a 80 y.o. male with a significant heart history.  He thus far has had a negative workup for ACS.  Chest pain is atypical.  Heart of 5.  Low suspicion for PE or dissection at this point. Cardiology to admit.   Janora Norlander, DO 12/22/15 [place], MD 12/23/15 [phone]

## 2015-12-22 NOTE — Telephone Encounter (Signed)
Spoke with Dr [name] today. Pt scheduled to see him back after follow-up CT of the abdomen. We will consider repeat heart cath after his follow-up

## 2015-12-22 NOTE — ED Notes (Signed)
Cardiology at bedside.

## 2015-12-23 DIAGNOSIS — R079 Chest pain, unspecified: Secondary | ICD-10-CM | POA: Diagnosis present

## 2015-12-23 DIAGNOSIS — R0789 Other chest pain: Secondary | ICD-10-CM | POA: Diagnosis not present

## 2015-12-23 DIAGNOSIS — R0781 Pleurodynia: Secondary | ICD-10-CM

## 2015-12-23 LAB — COMPREHENSIVE METABOLIC PANEL
ALBUMIN: 3.1 g/dL — AB (ref 3.5–5.0)
ALT: 16 U/L — ABNORMAL LOW (ref 17–63)
AST: 17 U/L (ref 15–41)
Alkaline Phosphatase: 84 U/L (ref 38–126)
Anion gap: 12 (ref 5–15)
BUN: 15 mg/dL (ref 6–20)
CHLORIDE: 102 mmol/L (ref 101–111)
CO2: 25 mmol/L (ref 22–32)
Calcium: 8.9 mg/dL (ref 8.9–10.3)
Creatinine, Ser: 0.78 mg/dL (ref 0.61–1.24)
GFR calc Af Amer: >60 mL/min (ref >60)
GFR calc non Af Amer: >60 mL/min (ref >60)
GLUCOSE: 101 mg/dL — AB (ref 65–99)
POTASSIUM: 3.9 mmol/L (ref 3.5–5.1)
Sodium: 139 mmol/L (ref 135–145)
Total Bilirubin: 1 mg/dL (ref 0.3–1.2)
Total Protein: 6.2 g/dL — ABNORMAL LOW (ref 6.5–8.1)

## 2015-12-23 LAB — CREATININE, SERUM
Creatinine, Ser: 0.78 mg/dL (ref 0.61–1.24)
GFR calc Af Amer: >60 mL/min (ref >60)
GFR calc non Af Amer: >60 mL/min (ref >60)

## 2015-12-23 LAB — POCT I-STAT TROPONIN I: TROPONIN I, POC: 0 ng/mL (ref 0.00–0.08)

## 2015-12-23 LAB — CBC
HEMATOCRIT: 31.8 % — AB (ref 39.0–52.0)
HEMOGLOBIN: 10.2 g/dL — AB (ref 13.0–17.0)
MCH: 28.7 pg (ref 26.0–34.0)
MCHC: 32.1 g/dL (ref 30.0–36.0)
MCV: 89.6 fL (ref 78.0–100.0)
Platelets: 225 10*3/uL (ref 150–400)
RBC: 3.55 MIL/uL — AB (ref 4.22–5.81)
RDW: 15.5 % (ref 11.5–15.5)
WBC: 7.6 10*3/uL (ref 4.0–10.5)

## 2015-12-23 LAB — TROPONIN I

## 2015-12-23 LAB — MRSA PCR SCREENING: MRSA by PCR: NEGATIVE

## 2015-12-23 LAB — LIPASE, BLOOD: LIPASE: 23 U/L (ref 11–51)

## 2015-12-23 LAB — D-DIMER, QUANTITATIVE (NOT AT ARMC): D DIMER QUANT: 0.27 ug{FEU}/mL (ref 0.00–0.50)

## 2015-12-23 LAB — AMYLASE: AMYLASE: 71 U/L (ref 28–100)

## 2015-12-23 MED ORDER — ONDANSETRON HCL 4 MG/2ML IJ SOLN: 4.0000 mg | Freq: Four times a day (QID) | INTRAMUSCULAR | Status: DC | PRN

## 2015-12-23 MED ORDER — ALBUTEROL SULFATE (2.5 MG/3ML) 0.083% IN NEBU: 2.5000 mg | INHALATION_SOLUTION | Freq: Every day | RESPIRATORY_TRACT | Status: DC

## 2015-12-23 MED ORDER — ASPIRIN EC 81 MG PO TBEC
81.0000 mg | DELAYED_RELEASE_TABLET | Freq: Every day | ORAL | Status: DC
  Filled 2015-12-23: qty 1

## 2015-12-23 MED ORDER — MORPHINE SULFATE (PF) 2 MG/ML IV SOLN: 2.0000 mg | INTRAVENOUS | Status: DC | PRN

## 2015-12-23 MED ORDER — ASPIRIN 81 MG PO CHEW: 324.0000 mg | CHEWABLE_TABLET | ORAL | Status: DC

## 2015-12-23 MED ORDER — ACETAMINOPHEN 325 MG PO TABS: 650.0000 mg | ORAL_TABLET | ORAL | Status: DC | PRN

## 2015-12-23 MED ORDER — IBUPROFEN 400 MG PO TABS
600.0000 mg | ORAL_TABLET | Freq: Once | ORAL | Status: AC
  Administered 2015-12-23: 600 mg via ORAL
  Filled 2015-12-23: qty 1

## 2015-12-23 MED ORDER — ASPIRIN 300 MG RE SUPP: 300.0000 mg | RECTAL | Status: DC

## 2015-12-23 MED ORDER — HEPARIN SODIUM (PORCINE) 5000 UNIT/ML IJ SOLN
5000.0000 [IU] | Freq: Three times a day (TID) | INTRAMUSCULAR | Status: DC
  Administered 2015-12-23: 5000 [IU] via SUBCUTANEOUS
  Filled 2015-12-23: qty 1

## 2015-12-23 MED ORDER — HEPARIN SODIUM (PORCINE) 5000 UNIT/ML IJ SOLN: 5000.0000 [IU] | Freq: Three times a day (TID) | INTRAMUSCULAR | Status: DC

## 2015-12-23 MED ORDER — NITROGLYCERIN 0.4 MG SL SUBL: 0.4000 mg | SUBLINGUAL_TABLET | SUBLINGUAL | Status: DC | PRN

## 2015-12-23 NOTE — ED Notes (Signed)
Spoke to Dr. Harrington Challenger.  Sts will place order for Stepdown bed.  Bed placement made aware.

## 2015-12-23 NOTE — ED Notes (Signed)
Bed placement called, states transfer order was placed for bed, will need to be new admission order.  Dr. Harrington Challenger made aware, will update.

## 2015-12-23 NOTE — Care Management Obs Status (Signed)
[facility] NOTIFICATION   Patient Details  Name: Corey Oneill MRN: [id number] Date of Birth: [birth year]   Medicare Observation Status Notification Given:  Yes    Bethena Roys, RN 12/23/2015, 1:28 PM

## 2015-12-23 NOTE — Progress Notes (Signed)
Patient Name: Corey Oneill Date of Encounter: 12/23/2015  Pt. Profile: 80yo with history of moderate CAD and AS Pt had FFR and PCI done in Aug 2016 with continued symptms of angina REpeat cath showed no change in anatomy Repeat echo suggest severe AS Mean gradient 34 mm Hg Followed by Ezzie Dural and Festus Barren Seen by Dr [name] on 12/16/15 Plan for f/u in 2 wksafter CT scan of abdomen 12/24/15 who presented with chest pain for several hours.   SUBJECTIVE  Pain resolved with morphine. Denies SOB.   CURRENT MEDS . acetaminophen  500 mg Oral QHS  . albuterol  2.5 mg Nebulization QHS  . aspirin  324 mg Oral NOW   Or  . aspirin  300 mg Rectal NOW  . [START ON 12/24/2015] aspirin EC  81 mg Oral Daily  . buPROPion  100 mg Oral Daily  . clopidogrel  75 mg Oral Daily  . ferrous fumarate  1 tablet Oral Daily  . FLUoxetine  10 mg Oral Daily  . heparin  5,000 Units Subcutaneous 3 times per day  . magnesium oxide  200 mg Oral QHS  . pantoprazole  40 mg Oral Daily  . rosuvastatin  10 mg Oral Daily    OBJECTIVE  Filed Vitals:   12/23/15 0100 12/23/15 0154 12/23/15 0530 12/23/15 0749  BP: 116/75 119/65 119/65 114/68  Pulse: 91 85 88 80  Temp:  98.1 F (36.7 C) 97.8 F (36.6 C) 98 F (36.7 C)  TempSrc:  Oral Oral Oral  Resp: 14 14 16    Height:  6' (1.829 m)    Weight:  135 lb 11.2 oz (61.553 kg)    SpO2: 97% 100% 99% 98%   No intake or output data in the 24 hours ending 12/23/15 0818 Filed Weights   12/23/15 0154  Weight: 135 lb 11.2 oz (61.553 kg)    PHYSICAL EXAM  General: Pleasant, NAD. Neuro: Alert and oriented X 3. Moves all extremities spontaneously. Psych: Normal affect. HEENT:  Normal  Neck: Supple without bruits or JVD. Lungs:  Resp regular and unlabored, CTA. Heart: RRR no s3, s4. Systolic murmurs. Abdomen: Soft, non-tender, non-distended, BS + x 4.  Extremities: No clubbing, cyanosis or edema. DP/PT/Radials 2+ and equal bilaterally.  Accessory Clinical  Findings  CBC  Recent Labs  12/22/15 2037 12/23/15 0345  WBC 10.7* 7.6  HGB 11.7* 10.2*  HCT 36.8* 31.8*  MCV 90.9 89.6  PLT 274 123456   Basic Metabolic Panel  Recent Labs  12/22/15 2037 12/23/15 0007 12/23/15 0345  NA 139 139  --   K 4.0 3.9  --   CL 101 102  --   CO2 29 25  --   GLUCOSE 89 101*  --   BUN 14 15  --   CREATININE 0.79 0.78 0.78  CALCIUM 9.2 8.9  --    Liver Function Tests  Recent Labs  12/23/15 0007  AST 17  ALT 16*  ALKPHOS 84  BILITOT 1.0  PROT 6.2*  ALBUMIN 3.1*    Recent Labs  12/23/15 0007  LIPASE 23  AMYLASE 71   Cardiac Enzymes  Recent Labs  12/22/15 2200 12/23/15 0619  TROPONINI <0.03 <0.03   BNP Invalid input(s): POCBNP D-Dimer  Recent Labs  12/22/15 2340  DDIMER 0.27    TELE  Sinus rhythm  Radiology/Studies  Dg Chest 2 View  12/22/2015  CLINICAL DATA:  Acute left-sided chest pain. EXAM: CHEST  2 VIEW COMPARISON:  December 13, 2015. FINDINGS: The heart size and mediastinal contours are within normal limits. Both lungs are clear. Hyperexpansion of the lungs is noted. No pneumothorax or pleural effusion is noted. The visualized skeletal structures are unremarkable. IMPRESSION: Hyperexpansion of the lungs. No acute cardiopulmonary abnormality seen. Electronically Signed   By: Marijo Conception, M.D.   On: 12/22/2015 21:15   Dg Chest 2 View  12/13/2015  CLINICAL DATA:  Chest pain x 1 day EXAM: CHEST  2 VIEW COMPARISON:  11/26/2015 FINDINGS: Mild hyperinflation. Midline trachea. Normal heart size. Calcified nodes in the left hilum are likely related to old granulomatous disease. Right costophrenic angle blunting on the frontal radiograph is similar and favored to be related to pleural thickening. No pneumothorax. Clear lungs. IMPRESSION: No acute cardiopulmonary disease. Electronically Signed   By: Abigail Miyamoto M.D.   On: 12/13/2015 16:20   Dg Chest 2 View  11/26/2015  CLINICAL DATA:  Chest pain. EXAM: CHEST  2 VIEW  COMPARISON:  06/22/2015. FINDINGS: Calcified left hilar lymph nodes are again noted. Lungs are clear. Heart size normal. Small right pleural effusion. No pneumothorax. No acute bony abnormality. IMPRESSION: Small right pleural effusion.  No acute pulmonary infiltrate. Electronically Signed   By: [name]  Register   On: 11/26/2015 15:09   Ct Abdomen Pelvis W Contrast  12/08/2015  CLINICAL DATA:  Right lower quadrant pain. Post appendectomy 2 weeks ago. Patient has a history of prostate cancer treated with radiation therapy. EXAM: CT ABDOMEN AND PELVIS WITH CONTRAST TECHNIQUE: Multidetector CT imaging of the abdomen and pelvis was performed using the standard protocol following bolus administration of intravenous contrast. CONTRAST:  98 cc Isovue 300 COMPARISON:  CT of the abdomen pelvis 11/15/2015 FINDINGS: Lower chest: No acute findings. Coronary artery calcified atherosclerotic disease is seen. Hepatobiliary: There is a stable too small to characterized 8 mm right subcapsular hepatic lesion. Pancreas: No mass, inflammatory changes, or other significant abnormality. Spleen: Within normal limits in size and appearance. Adrenals/Urinary Tract: No evidence of hydronephrosis. There are bilateral hypoattenuated circumscribed renal masses. The 8 mm mass in the inferior pole of the left kidney and 15 mm mostly exophytic mass off of the inferior pole of the right kidney demonstrate intermediate density, and may represent hemorrhagic cysts. Stomach/Bowel: No evidence of obstruction. There are nonspecific thickening of proximal jejunal loops in the the left upper quadrant of the abdomen, image 26/ sequence 2. There has been a recent appendectomy, with extensive inflammatory mesenteric fat stranding and soft tissue thickening adjacent to the anterior and medial portion of the cecum, the terminal ileum and overlying perineum. There is an irregular hypoattenuated structure immediately adjacent to the appendectomy clips which  measures 2.5 x 2.1 x 1.8 cm and may represent a small phlegmon collection. No evidence of free gas or free fluid within the abdomen. Vascular/Lymphatic: No pathologically enlarged lymph nodes. No evidence of abdominal aortic aneurysm. Calcified atherosclerotic disease of the aorta is seen. Reproductive: Surgical clips or radiation seeds are seen within the periphery of the prostate gland. Other: Diffuse subcutaneous fat stranding. Musculoskeletal: There is a stable 7 mm sclerotic focus within L4 vertebral body. Advanced osteoarthritic changes of the lower lumbosacral spine and posterior facet arthropathy are also seen causing narrowing of the bilateral L5-S1 bony neural foramina. 2.5 cm area of coarsened trabeculation in the left iliac bone is stable. IMPRESSION: Status post recent appendectomy with extensive pericecal inflammatory changes with 2.5 cm probable phlegmon collection within the surgical site. No evidence of free fluid or free  gas within the abdomen. Diffuse mild thickening of jejunal loops in the left upper outer quadrant of the abdomen, likely second into enteritis. Stable 8 mm right hepatic lesion. Bilateral renal masses, which may represent hemorrhagic cysts. Electronically Signed   By: Fidela Salisbury M.D.   On: 12/08/2015 18:37    ASSESSMENT AND PLAN   1. Chest pain - Presented with atypical chest pain which was worsen with deep breath. D-dimer WNL. CXR without acute abnormality. Trops negative. EKG without acute changes.  - Pain resolved with morphine.   2. Moderate to severe Aortic stenosis   Valve area (Vmax): 0.82 cm^2.  Aortic valve peak velocity, S       361  cm/s    Aortic valve mean velocity, S       274  cm/s  Currently undergoing surgical evaluation.   3. Recent appendicitis with abscess - S/p appendectomy. Just finished course of abx. Denies abdominal pain. Schedule for CT of abdomen tomorrow (Ordered by Dr [name]). He is NPO incase need to be  done today. Will discuss with MD.   4. CAD - S/p PCI to the distal RCA 06/18/15 has residual disease that will be managed medically - NSTEMI but repeat cath 06/23/2015 was stable - would favor continued medical management - Continue ASA, Plavix and low dose Crestor. Not on BB, unknown reason.   Jarrett Soho PA-C Pager [phone]  Patient seen, examined. Available data reviewed. Agree with findings, assessment, and plan as outlined by Robbie Lis, PA-C.  The patient is independently interviewed and examined.  He is very well-known to me.  Dr. [name] and I have recently discussed his case regarding timing of aortic valve replacement and best treatment (conventional aortic valve replacement versus TAPVR).  The patient is still recovering from recent appendectomy.  He has a follow-up CAT scan with and without oral contrast arranged for tomorrow as an outpatient.  I do not think there is anything acute going on with his heart.  He had prolonged atypical pleuritic chest pain yesterday that has now fully resolved.  His cardiac markers are negative.  His abdominal exam is benign.  Heart is regular rate and rhythm with a 2/6 harsh systolic murmur along the left sternal border.  Lung fields are clear.  He can be discharged on with outpatient follow-up as scheduled.  We discussed that he will require an updated cardiac catheterization study prior to treatment of his aortic stenosis.  This will be arranged after his CT scan and surgical follow-up have been completed.  [name], M.D. 12/23/2015 11:11 AM

## 2015-12-23 NOTE — ED Notes (Signed)
Spoke to 2W.  Pt currently still having CP, and unable to be accepted on this unit.  Will have secretary page admitting MD

## 2015-12-23 NOTE — Discharge Summary (Signed)
Discharge Summary    Patient ID: Corey Oneill,  MRN: [id number], DOB/AGE: 06-19-1936 80 y.o.  Admit date: 12/22/2015 Discharge date: 12/23/2015  Primary Care Provider: [name] Primary Cardiologist: Dr. Burt Knack  Discharge Diagnoses    Principal Problem:   Chest pain Active Problems:   Hyperlipidemia   Moderate to severe Aortic stenosis   Esophageal reflux   CAD (coronary artery disease), native coronary artery   Recent appendicitis with abscess S/P appendectomy   Allergies No Known Allergies  Diagnostic Studies/Procedures    None   History of Present Illness     80yo with history of moderate CAD and AS Pt had FFR and PCI done in Aug 2016 with continued symptms of angina. Repeat cath showed no change in anatomy. Repeat echo suggest severe AS Mean gradient 34 mm Hg. Followed by Ezzie Dural and Festus Barren. Laseseen by Dr [name] on 12/16/15--> Plan for f/u in 2 wksafter CT scan of abdomen 12/24/15 who presented 12/22/15 with chest pain for several hours.   Recent appendicitis with abscess. Just finished course of abx. Denies abdominal pain. Schedule for CT of abdomen planned 12/24/15 (Ordered by Dr [name]).  Hospital Course     Consultants: None  The patient was admitted for chest pain observation (Presented with atypical chest pain which was worsen with deep breath) and ruled out. D-dimer WNL. CXR without acute abnormality. Trops negative. EKG without acute changes.  Pain resolved with morphine.   The patient has been seen by Dr. Burt Knack  today and deemed ready for discharge home. All follow-up appointments have been scheduled. Discharge medications are listed below.   He has a follow-up CAT scan with and without oral contrast arranged for tomorrow (12/24/15)  as an outpatient. Siscussed that he will require an updated cardiac catheterization study prior to treatment of his aortic stenosis. This will be arranged after his CT scan and surgical follow-up have  been completed. No change in home meds during admission.    Discharge Vitals Blood pressure 114/68, pulse 80, temperature 98 F (36.7 C), temperature source Oral, resp. rate 16, height 6' (1.829 m), weight 135 lb 11.2 oz (61.553 kg), SpO2 98 %.  Filed Weights   12/23/15 0154  Weight: 135 lb 11.2 oz (61.553 kg)    Labs & Radiologic Studies     CBC  Recent Labs  12/22/15 2037 12/23/15 0345  WBC 10.7* 7.6  HGB 11.7* 10.2*  HCT 36.8* 31.8*  MCV 90.9 89.6  PLT 274 123456   Basic Metabolic Panel  Recent Labs  12/22/15 2037 12/23/15 0007 12/23/15 0345  NA 139 139  --   K 4.0 3.9  --   CL 101 102  --   CO2 29 25  --   GLUCOSE 89 101*  --   BUN 14 15  --   CREATININE 0.79 0.78 0.78  CALCIUM 9.2 8.9  --    Liver Function Tests  Recent Labs  12/23/15 0007  AST 17  ALT 16*  ALKPHOS 84  BILITOT 1.0  PROT 6.2*  ALBUMIN 3.1*    Recent Labs  12/23/15 0007  LIPASE 23  AMYLASE 71   Cardiac Enzymes  Recent Labs  12/22/15 2200 12/23/15 0619  TROPONINI <0.03 <0.03   BNP Invalid input(s): POCBNP D-Dimer  Recent Labs  12/22/15 2340  DDIMER 0.27   Hemoglobin A1C No results for input(s): HGBA1C in the last 72 hours. Fasting Lipid Panel No results for input(s): CHOL, HDL, LDLCALC, TRIG, CHOLHDL,  LDLDIRECT in the last 72 hours. Thyroid Function Tests No results for input(s): TSH, T4TOTAL, T3FREE, THYROIDAB in the last 72 hours.  Invalid input(s): FREET3  Dg Chest 2 View  12/22/2015  CLINICAL DATA:  Acute left-sided chest pain. EXAM: CHEST  2 VIEW COMPARISON:  December 13, 2015. FINDINGS: The heart size and mediastinal contours are within normal limits. Both lungs are clear. Hyperexpansion of the lungs is noted. No pneumothorax or pleural effusion is noted. The visualized skeletal structures are unremarkable. IMPRESSION: Hyperexpansion of the lungs. No acute cardiopulmonary abnormality seen. Electronically Signed   By: Marijo Conception, M.D.   On: 12/22/2015  21:15   Dg Chest 2 View  12/13/2015  CLINICAL DATA:  Chest pain x 1 day EXAM: CHEST  2 VIEW COMPARISON:  11/26/2015 FINDINGS: Mild hyperinflation. Midline trachea. Normal heart size. Calcified nodes in the left hilum are likely related to old granulomatous disease. Right costophrenic angle blunting on the frontal radiograph is similar and favored to be related to pleural thickening. No pneumothorax. Clear lungs. IMPRESSION: No acute cardiopulmonary disease. Electronically Signed   By: Abigail Miyamoto M.D.   On: 12/13/2015 16:20   Dg Chest 2 View  11/26/2015  CLINICAL DATA:  Chest pain. EXAM: CHEST  2 VIEW COMPARISON:  06/22/2015. FINDINGS: Calcified left hilar lymph nodes are again noted. Lungs are clear. Heart size normal. Small right pleural effusion. No pneumothorax. No acute bony abnormality. IMPRESSION: Small right pleural effusion.  No acute pulmonary infiltrate. Electronically Signed   By: [name]  Register   On: 11/26/2015 15:09   Ct Abdomen Pelvis W Contrast  12/08/2015  CLINICAL DATA:  Right lower quadrant pain. Post appendectomy 2 weeks ago. Patient has a history of prostate cancer treated with radiation therapy. EXAM: CT ABDOMEN AND PELVIS WITH CONTRAST TECHNIQUE: Multidetector CT imaging of the abdomen and pelvis was performed using the standard protocol following bolus administration of intravenous contrast. CONTRAST:  98 cc Isovue 300 COMPARISON:  CT of the abdomen pelvis 11/15/2015 FINDINGS: Lower chest: No acute findings. Coronary artery calcified atherosclerotic disease is seen. Hepatobiliary: There is a stable too small to characterized 8 mm right subcapsular hepatic lesion. Pancreas: No mass, inflammatory changes, or other significant abnormality. Spleen: Within normal limits in size and appearance. Adrenals/Urinary Tract: No evidence of hydronephrosis. There are bilateral hypoattenuated circumscribed renal masses. The 8 mm mass in the inferior pole of the left kidney and 15 mm mostly  exophytic mass off of the inferior pole of the right kidney demonstrate intermediate density, and may represent hemorrhagic cysts. Stomach/Bowel: No evidence of obstruction. There are nonspecific thickening of proximal jejunal loops in the the left upper quadrant of the abdomen, image 26/ sequence 2. There has been a recent appendectomy, with extensive inflammatory mesenteric fat stranding and soft tissue thickening adjacent to the anterior and medial portion of the cecum, the terminal ileum and overlying perineum. There is an irregular hypoattenuated structure immediately adjacent to the appendectomy clips which measures 2.5 x 2.1 x 1.8 cm and may represent a small phlegmon collection. No evidence of free gas or free fluid within the abdomen. Vascular/Lymphatic: No pathologically enlarged lymph nodes. No evidence of abdominal aortic aneurysm. Calcified atherosclerotic disease of the aorta is seen. Reproductive: Surgical clips or radiation seeds are seen within the periphery of the prostate gland. Other: Diffuse subcutaneous fat stranding. Musculoskeletal: There is a stable 7 mm sclerotic focus within L4 vertebral body. Advanced osteoarthritic changes of the lower lumbosacral spine and posterior facet arthropathy are also  seen causing narrowing of the bilateral L5-S1 bony neural foramina. 2.5 cm area of coarsened trabeculation in the left iliac bone is stable. IMPRESSION: Status post recent appendectomy with extensive pericecal inflammatory changes with 2.5 cm probable phlegmon collection within the surgical site. No evidence of free fluid or free gas within the abdomen. Diffuse mild thickening of jejunal loops in the left upper outer quadrant of the abdomen, likely second into enteritis. Stable 8 mm right hepatic lesion. Bilateral renal masses, which may represent hemorrhagic cysts. Electronically Signed   By: Fidela Salisbury M.D.   On: 12/08/2015 18:37    Disposition   Pt is being discharged home today in  good condition.  Follow-up Plans & Appointments    Follow-up Information    Follow up with Grace Isaac, MD.   Specialty:  Cardiothoracic Surgery   Why:  as schedule   Contact information:   [address] [place] [address] [phone]      Discharge Instructions    Diet - low sodium heart healthy    Complete by:  As directed      Discharge instructions    Complete by:  As directed   Please attained all prior  scheduled study and f/u appointment     Increase activity slowly    Complete by:  As directed            Discharge Medications   Discharge Medication List as of 12/23/2015 12:52 PM    CONTINUE these medications which have NOT CHANGED   Details  acetaminophen (TYLENOL) 500 MG tablet Take 500 mg by mouth at bedtime. Reported on 11/26/2015, Until Discontinued, Historical Med    albuterol (PROVENTIL HFA;VENTOLIN HFA) 108 (90 Base) MCG/ACT inhaler Inhale 1-2 puffs into the lungs at bedtime., Until Discontinued, Historical Med    aspirin EC 81 MG tablet Take 81 mg by mouth daily., Until Discontinued, Historical Med    buPROPion (WELLBUTRIN SR) 100 MG 12 hr tablet Take 100 mg by mouth daily. , Until Discontinued, Historical Med    clopidogrel (PLAVIX) 75 MG tablet Take 1 tablet (75 mg total) by mouth daily., Starting 07/26/2015, Until Discontinued, Normal    dexlansoprazole (DEXILANT) 60 MG capsule Take 60 mg by mouth daily., Until Discontinued, Historical Med    ferrous fumarate (HEMOCYTE - 106 MG FE) 325 (106 FE) MG TABS tablet Take 1 tablet (106 mg of iron total) by mouth daily., Starting 07/26/2015, Until Discontinued, OTC    FLUoxetine (PROZAC) 10 MG capsule Take 1 capsule (10 mg total) by mouth daily., Starting 03/22/2015, Until Discontinued, Normal    Magnesium 250 MG TABS Take 250 mg by mouth at bedtime. , Until Discontinued, Historical Med    nitroGLYCERIN (NITROSTAT) 0.4 MG SL tablet Place 0.4 mg under the tongue every 5 (five) minutes as  needed for chest pain. Reported on 11/26/2015, Until Discontinued, Historical Med    oxyCODONE (OXY IR/ROXICODONE) 5 MG immediate release tablet Take 1-2 tablets (5-10 mg total) by mouth every 6 (six) hours as needed for moderate pain, severe pain or breakthrough pain., Starting 11/21/2015, Until Discontinued, Print    rosuvastatin (CRESTOR) 10 MG tablet Take 1 tablet (10 mg total) by mouth daily., Starting 06/29/2015, Until Discontinued, Normal            Outstanding Labs/Studies   None  Duration of Discharge Encounter   Greater than 30 minutes including physician time.  Signed, Evadna Donaghy PA-C 12/23/2015, 3:09 PM

## 2015-12-23 NOTE — ED Notes (Signed)
Pt up to restroom, gait steady and even.

## 2015-12-24 ENCOUNTER — Ambulatory Visit
Admission: RE | Admit: 2015-12-24 | Discharge: 2015-12-24 | Disposition: A | Discharge status: Home / Self Care | Payer: Medicare Other | Source: Ambulatory Visit | Attending: Surgery | Admitting: Surgery

## 2015-12-24 DIAGNOSIS — R1084 Generalized abdominal pain: Secondary | ICD-10-CM | POA: Diagnosis not present

## 2015-12-24 DIAGNOSIS — R109 Unspecified abdominal pain: Secondary | ICD-10-CM

## 2015-12-24 MED ORDER — IOPAMIDOL (ISOVUE-300) INJECTION 61%
100.0000 mL | Freq: Once | INTRAVENOUS | Status: AC | PRN
  Administered 2015-12-24: 100 mL via INTRAVENOUS

## 2015-12-25 DIAGNOSIS — M545 Low back pain: Secondary | ICD-10-CM | POA: Diagnosis not present

## 2015-12-29 ENCOUNTER — Encounter: Payer: Self-pay | Admitting: Family Medicine

## 2015-12-30 ENCOUNTER — Ambulatory Visit (INDEPENDENT_AMBULATORY_CARE_PROVIDER_SITE_OTHER): Payer: Medicare Other | Admitting: Cardiothoracic Surgery

## 2015-12-30 ENCOUNTER — Encounter: Payer: Self-pay | Admitting: Cardiothoracic Surgery

## 2015-12-30 VITALS — BP 154/80 | HR 73 | Resp 16 | Ht 72.0 in | Wt 135.0 lb

## 2015-12-30 DIAGNOSIS — I350 Nonrheumatic aortic (valve) stenosis: Secondary | ICD-10-CM

## 2015-12-30 DIAGNOSIS — I2510 Atherosclerotic heart disease of native coronary artery without angina pectoris: Secondary | ICD-10-CM | POA: Diagnosis not present

## 2015-12-30 DIAGNOSIS — I208 Other forms of angina pectoris: Secondary | ICD-10-CM | POA: Diagnosis not present

## 2015-12-30 NOTE — Progress Notes (Signed)
[address]       [place] [address]             [phone]                    Corey Oneill [place] Medical Record [id number] Date of Birth: 1936-05-22  Referring: [name], MD Primary Care: [name], MD  Chief Complaint:  sob   History of Present Illness:    Corey Oneill 80 y.o. male is seen in the office for CAD and aortic stenosis. He was initially seen in May 2016 with exertional angina and moderate aortic stenosis by echo. He underwent cath demonstrating moderate CAD and moderate AS - medical therapy was recommended. He then went on to have recurrent angina.  Repeat cath, FFR, and PCI was done  in August 2016. His symptoms have never really improved with PCI. Repeat cath was done and showed no changes in anatomy. He recently underwent a repeat echo suggestive of severe AS with findings as outlined below.   He was seen as outpatient by Cardiology and referred to consider AVR. He noted of an ache in the center of his chest that occurs with stress and has occurred with brisk walking. He denies dyspnea, orthopnea, or PND. He complains of dizziness  But no  Syncope. He had a repeat overnight admission to the hospital February 9 for recurrent chest pain and was discharged home the following day, cardiac enzymes were negative.  Continues to have some rectal bleeding  And hematuria intermittent related to radiation proctitis. With frequent urination   The day following his cardiology visit he presented   to [facility] ED 12/30 for evaluation of worsening abdominal pain. He under went appendectomy for Appendicitis perforated with pelvic abscess 11/16/2015.  He continues to have mild right lower quadrant discomfort  A follow-up CT scan showed a phlegmon in the right lower quadrant. Follow-up CT scan February 10 inch in this to improve but still present.   Since I saw in 2 weeks ago his right lower quadrant tenderness has improved but not  completely resolved.      Wt Readings from Last 3 Encounters:  12/30/15 135 lb (61.236 kg)  12/23/15 135 lb 11.2 oz (61.553 kg)  12/21/15 139 lb (63.05 kg)   Current Activity/ Functional Status:  Patient is independent with mobility/ambulation, transfers, ADL's, IADL's.   Zubrod Score: At the time of surgery this patient's most appropriate activity status/level should be described as: []     0    Normal activity, no symptoms [x]     1    Restricted in physical strenuous activity but ambulatory, able to do out light work []     2    Ambulatory and capable of self care, unable to do work activities, up and about               >50 % of waking hours                              []     3    Only limited self care, in bed greater than 50% of waking hours []     4    Completely disabled, no self care, confined to bed or chair []     5    Moribund   Past Medical History  Diagnosis Date  . Hyperlipidemia   .  Esophageal reflux   . Hx of radiation therapy 04/14/13-06/09/13    prostate 7800 cGy, 40 sessions, seminal vesicles 5600 cGy 40 sessions  . CAD (coronary artery disease)     a.  LHC 8/16: Mid to distal LAD 30%, OM1 40%, proximal mid RCA 40%, distal RCA 60% >> FFR 0.69  >> PCI: 3 x 15 mm Resolute DES  . Aortic stenosis     a. peak to peak gradient by LHC 8/16:  37 mmHg (moderately severe)   . Radiation proctitis   . Family history of adverse reaction to anesthesia     "daughter has PONV"  . Heart murmur   . Anginal pain [place])   . H/O blood clots     "get them in my stool and urine" (11/12/2015)  . Depression   . Prostate cancer [facility]) dx'd 2014    Past Surgical History  Procedure Laterality Date  . Nasal fracture surgery      "broken years ago; several ORs to correct it"  . Tonsillectomy    . Prostate biopsy  2014    "needle biopsy"  . Colonoscopy    . Cardiac catheterization N/A 04/21/2015    Procedure: Right/Left Heart Cath and Coronary Angiography;  Surgeon: [name], MD;   Location: [facility];  Service: Cardiovascular;  Laterality: N/A;  . Cardiac catheterization N/A 06/18/2015    Procedure: Intravascular Pressure Wire/FFR Study;  Surgeon: [name], MD;  Location: [place] [facility];  Service: Cardiovascular;  Laterality: N/A;  . Cardiac catheterization N/A 06/18/2015    Procedure: Coronary Stent Intervention;  Surgeon: [name], MD;  Location: [facility] [facility];  Service: Cardiovascular;  Laterality: N/A;  . Cardiac catheterization N/A 06/18/2015    Procedure: Right/Left Heart Cath and Coronary Angiography;  Surgeon: [name], MD;  Location: [place] [facility];  Service: Cardiovascular;  Laterality: N/A;  . Cardiac catheterization N/A 06/23/2015    Procedure: Left Heart Cath and Cors/Grafts Angiography;  Surgeon: [name], MD;  Location: [place] [facility];  Service: Cardiovascular;  Laterality: N/A;  . Nasal septum surgery    . Inguinal hernia repair    . Fracture surgery    . Laparoscopic appendectomy N/A 11/16/2015    Procedure: APPENDECTOMY LAPAROSCOPIC;  Surgeon: [name], MD;  Location: [facility] OR;  Service: General;  Laterality: N/A;    Family History  Problem Relation Age of Onset  . Brain cancer Father   . Depression Daughter   . Lymphoma Mother     Social History   Social History  . Marital Status: Married    Spouse Name: N/A  . Number of Children: 2  . Years of Education: N/A   Occupational History  . Not on file.   Social History Main Topics  . Smoking status: Former Smoker -- 0.00 packs/day    Types: Cigarettes  . Smokeless tobacco: Never Used     Comment: "quit smoking cigarettes in the 1960s; quit smoking cigars in the 1990s  . Alcohol Use: Yes  . Drug Use: No  . Sexual Activity: Not on file   Other Topics Concern  . Not on file   Social History Narrative    History  Smoking status  . Former Smoker -- 0.00 packs/day  . Types: Cigarettes  Smokeless tobacco  . Never Used    Comment: "quit  smoking cigarettes in the 1960s; quit smoking cigars in the 1990s    History  Alcohol Use  .  Yes     No Known Allergies  Current Outpatient Prescriptions  Medication Sig Dispense Refill  . acetaminophen (TYLENOL) 500 MG tablet Take 500 mg by mouth at bedtime. Reported on 11/26/2015    . albuterol (PROVENTIL HFA;VENTOLIN HFA) 108 (90 Base) MCG/ACT inhaler Inhale 1-2 puffs into the lungs at bedtime.    [name] aspirin EC 81 MG tablet Take 81 mg by mouth daily.    [name] buPROPion (WELLBUTRIN SR) 100 MG 12 hr tablet Take 100 mg by mouth daily.     . clopidogrel (PLAVIX) 75 MG tablet Take 1 tablet (75 mg total) by mouth daily. 30 tablet 9  . ferrous fumarate (HEMOCYTE - 106 MG FE) 325 (106 FE) MG TABS tablet Take 1 tablet (106 mg of iron total) by mouth daily. 30 each 0  . FLUoxetine (PROZAC) 10 MG capsule Take 1 capsule (10 mg total) by mouth daily. 90 capsule 3  . Magnesium 250 MG TABS Take 250 mg by mouth at bedtime.     . nitroGLYCERIN (NITROSTAT) 0.4 MG SL tablet Place 0.4 mg under the tongue every 5 (five) minutes as needed for chest pain. Reported on 11/26/2015    . oxyCODONE (OXY IR/ROXICODONE) 5 MG immediate release tablet Take 1-2 tablets (5-10 mg total) by mouth every 6 (six) hours as needed for moderate pain, severe pain or breakthrough pain. 40 tablet 0  . rosuvastatin (CRESTOR) 10 MG tablet Take 1 tablet (10 mg total) by mouth daily. (Patient taking differently: Take 10 mg by mouth at bedtime. ) 90 tablet 3   No current facility-administered medications for this visit.      Review of Systems:     Cardiac Review of Systems: Y or N  Chest Pain [ y   ]  Resting SOB Blue.Reese   ] Exertional SOB  Blue.Reese  ]  Orthopnea [ n ]   Pedal Edema [ n  ]    Palpitations [n  ] Syncope  [n  ]   Presyncope [ y  ]  General Review of Systems: [Y] = yes [  ]=no Constitional: recent weight change [25-30 lb wt loss  ];  Wt loss over the last 3 months [   ] anorexia [  ]; fatigue [  ]; nausea [  ]; night sweats [  ];  fever [  ]; or chills [  ];          Dental: poor dentition[  ]; Last Dentist visit:   Eye : blurred vision [  ]; diplopia [   ]; vision changes [  ];  Amaurosis fugax[  ]; Resp: cough [  ];  wheezing[ n ];  hemoptysis[n  ]; shortness of breath[y  ]; paroxysmal nocturnal dyspnea[  ]; dyspnea on exertion[  ]; or orthopnea[  ];  GI:  gallstones[  ], vomiting[  ];  dysphagia[  ]; melena[  ];  hematochezia Blue.Reese  ]; heartburn[  ];   Hx of  Colonoscopy[y  ]; GU: kidney stones [  ]; hematuria[  ];   dysuria [  ];  nocturia[  ];  history of     obstruction [  ]; urinary frequency [  ]             Skin: rash, swelling[  ];, hair loss[  ];  peripheral edema[  ];  or itching[  ]; Musculosketetal: myalgias[  ];  joint swelling[  ];  joint erythema[  ];  joint pain[  ];  back  pain[  ];  Heme/Lymph: bruising[  ];  bleeding[y  ];  anemia[  ];  Neuro: TIA[  ];  headaches[  ];  stroke[  ];  vertigo[  ];  seizures[  ];   paresthesias[  ];  difficulty walking[  ];  Psych:depression[  ]; anxiety[  ];  Endocrine: diabetes[  ];  thyroid dysfunction[  ];  Immunizations: Flu up to date [  ]; Pneumococcal up to date [  ];  Other:  Physical Exam: BP 154/80 mmHg  Pulse 73  Resp 16  Ht 6' (1.829 m)  Wt 135 lb (61.236 kg)  BMI 18.31 kg/m2  SpO2 98%  PHYSICAL EXAMINATION: General appearance: alert, cooperative, appears older than stated age and cachectic Head: Normocephalic, without obvious abnormality, atraumatic Neck: no adenopathy, no carotid bruit, no JVD, supple, symmetrical, trachea midline and thyroid not enlarged, symmetric, no tenderness/mass/nodules,no obvious dental problemsI di Lymph nodes: Cervical, supraclavicular, and axillary nodes normal. Resp: clear to auscultation bilaterally Back: symmetric, no curvature. ROM normal. No CVA tenderness. Cardio: systolic murmur: holosystolic 3/6, crescendo at 2nd left intercostal space GI: soft, mild right lower quadrant tenderness, even when push on the left.  non-tender; bowel sounds normal; no masses,  no organomegaly Extremities: extremities normal, atraumatic, no cyanosis or edema Neurologic: Grossly normal  Diagnostic Studies & Laboratory data:     Recent Radiology Findings:   Dg Chest 2 View  12/13/2015  CLINICAL DATA:  Chest pain x 1 day EXAM: CHEST  2 VIEW COMPARISON:  11/26/2015 FINDINGS: Mild hyperinflation. Midline trachea. Normal heart size. Calcified nodes in the left hilum are likely related to old granulomatous disease. Right costophrenic angle blunting on the frontal radiograph is similar and favored to be related to pleural thickening. No pneumothorax. Clear lungs. IMPRESSION: No acute cardiopulmonary disease. Electronically Signed   By: Abigail Miyamoto M.D.   On: 12/13/2015 16:20   Dg Chest 2 View  11/26/2015  CLINICAL DATA:  Chest pain. EXAM: CHEST  2 VIEW COMPARISON:  06/22/2015. FINDINGS: Calcified left hilar lymph nodes are again noted. Lungs are clear. Heart size normal. Small right pleural effusion. No pneumothorax. No acute bony abnormality. IMPRESSION: Small right pleural effusion.  No acute pulmonary infiltrate. Electronically Signed   By: [name]  Register   On: 11/26/2015 15:09   Ct Abdomen Pelvis W Contrast  12/08/2015  CLINICAL DATA:  Right lower quadrant pain. Post appendectomy 2 weeks ago. Patient has a history of prostate cancer treated with radiation therapy. EXAM: CT ABDOMEN AND PELVIS WITH CONTRAST TECHNIQUE: Multidetector CT imaging of the abdomen and pelvis was performed using the standard protocol following bolus administration of intravenous contrast. CONTRAST:  98 cc Isovue 300 COMPARISON:  CT of the abdomen pelvis 11/15/2015 FINDINGS: Lower chest: No acute findings. Coronary artery calcified atherosclerotic disease is seen. Hepatobiliary: There is a stable too small to characterized 8 mm right subcapsular hepatic lesion. Pancreas: No mass, inflammatory changes, or other significant abnormality. Spleen: Within normal  limits in size and appearance. Adrenals/Urinary Tract: No evidence of hydronephrosis. There are bilateral hypoattenuated circumscribed renal masses. The 8 mm mass in the inferior pole of the left kidney and 15 mm mostly exophytic mass off of the inferior pole of the right kidney demonstrate intermediate density, and may represent hemorrhagic cysts. Stomach/Bowel: No evidence of obstruction. There are nonspecific thickening of proximal jejunal loops in the the left upper quadrant of the abdomen, image 26/ sequence 2. There has been a recent appendectomy, with extensive inflammatory mesenteric fat stranding and  soft tissue thickening adjacent to the anterior and medial portion of the cecum, the terminal ileum and overlying perineum. There is an irregular hypoattenuated structure immediately adjacent to the appendectomy clips which measures 2.5 x 2.1 x 1.8 cm and may represent a small phlegmon collection. No evidence of free gas or free fluid within the abdomen. Vascular/Lymphatic: No pathologically enlarged lymph nodes. No evidence of abdominal aortic aneurysm. Calcified atherosclerotic disease of the aorta is seen. Reproductive: Surgical clips or radiation seeds are seen within the periphery of the prostate gland. Other: Diffuse subcutaneous fat stranding. Musculoskeletal: There is a stable 7 mm sclerotic focus within L4 vertebral body. Advanced osteoarthritic changes of the lower lumbosacral spine and posterior facet arthropathy are also seen causing narrowing of the bilateral L5-S1 bony neural foramina. 2.5 cm area of coarsened trabeculation in the left iliac bone is stable. IMPRESSION: Status post recent appendectomy with extensive pericecal inflammatory changes with 2.5 cm probable phlegmon collection within the surgical site. No evidence of free fluid or free gas within the abdomen. Diffuse mild thickening of jejunal loops in the left upper outer quadrant of the abdomen, likely second into enteritis. Stable 8 mm  right hepatic lesion. Bilateral renal masses, which may represent hemorrhagic cysts. Electronically Signed   By: Fidela Salisbury M.D.   On: 12/08/2015 18:37     I have independently reviewed the above radiologic studies.  Recent Lab Findings: Lab Results  Component Value Date   WBC 7.6 12/23/2015   HGB 10.2* 12/23/2015   HCT 31.8* 12/23/2015   PLT 225 12/23/2015   GLUCOSE 101* 12/23/2015   CHOL 155 06/18/2015   TRIG 99 06/18/2015   HDL 49 06/18/2015   LDLCALC 86 06/18/2015   ALT 16* 12/23/2015   AST 17 12/23/2015   NA 139 12/23/2015   K 3.9 12/23/2015   CL 102 12/23/2015   CREATININE 0.78 12/23/2015   BUN 15 12/23/2015   CO2 25 12/23/2015   TSH 3.455 12/13/2015   INR 1.20 12/22/2015   ECHO: 11/01/2015 LV EF: 55% -  60%  ------------------------------------------------------------------- Indications:   (I35.0).  ------------------------------------------------------------------- History:  PMH: Acquired from the patient and from the patient&'s chart. Coronary artery disease. Aortic stenosis. Risk factors: Dyslipidemia.  ------------------------------------------------------------------- Study Conclusions  - Left ventricle: The cavity size was normal. There was mild concentric hypertrophy. Systolic function was normal. The estimated ejection fraction was in the range of 55% to 60%. Wall motion was normal; there were no regional wall motion abnormalities. Doppler parameters are consistent with abnormal left ventricular relaxation (grade 1 diastolic dysfunction). - Aortic valve: Valve mobility was restricted. There was severe stenosis. Valve area (Vmax): 0.82 cm^2. - Mitral valve: Moderately calcified annulus. - Right ventricle: The cavity size was normal. Wall thickness was normal. Systolic function was normal. - Tricuspid valve: There was trivial regurgitation. - Inferior vena cava: The vessel was normal in size. The respirophasic  diameter changes were in the normal range (>= 50%), consistent with normal central venous pressure. Aortic valve:  Trileaflet; moderately thickened, severely calcified leaflets. Valve mobility was restricted. Doppler: There was severe stenosis.  There was no regurgitation.  VTI ratio of LVOT to aortic valve: 0.22. Valve area (VTI): 0.85 cm^2. Indexed valve area (VTI): 0.46 cm^2/m^2. Peak velocity ratio of LVOT to aortic valve: 0.21. Valve area (Vmax): 0.82 cm^2. Indexed valve area (Vmax): 0.44 cm^2/m^2. Mean velocity ratio of LVOT to aortic valve: 0.2. Valve area (Vmean): 0.77 cm^2. Indexed valve area (Vmean): 0.42 cm^2/m^2.  Mean gradient (S): 34  mm Hg. Peak gradient (S): 52 mm Hg. Impressions:  - Although peak velocity and mean gradient calculate as moderate, there appears to be severe stenosis of the aortic valve. The calculated valve area indicates severe stenosis. It is likely that the peak gradient was not identified in this study. Study is limited by poor sound transmission.   Cardiac Cath: Conclusion     Widely patent distal right coronary stent.  Diffuse proximal to mid RCA disease with up to 50% narrowing, unchanged from the prior study.  40-50% mid first obtuse marginal unchanged from prior study. Widely patent LAD with diffuse plaquing up to 30-40%.  No new source for angina is identified. The current episode of chest pain if cardiac could've been related to coronary spasm. No evidence of thrombus is noted.  Left ventriculography was not performed RECOMMENDATION: Continue medical therapy. Consider adding long-acting nitrates if tolerated by blood pressure.  Eligible for discharge at the discretion of the treating team/Dr. Wynonia Lawman     STS Calculation :  risk of mortality 1.58%  Morbidity or mortality  12.5%  Long length of stay 5%  Permanent stroke 1.6%  Prolonged ventilation 6.6%  Renal failure 2.8%  Reoperation 7.5  Short length of stay  45%   Assessment / Plan:   1/ Moderate to severe symptomatic AS and CAD with stent of PDA  Valve area (Vmax): 0.82 cm^2.   Aortic valve peak velocity, S       361  cm/s   --------- Aortic valve mean velocity, S       274  cm/s   STS criteria his risk of mortality with aortic valve replacement coronary artery bypass grafting is approximately 2% morbidity and mortality 12% however this does not take into account the patient's overall physical condition currently in his ongoing medical problems with radiation proctitis and cystitis. Patient will be referred back to cardiology to consider repeat cardiac catheterization.  After this we will further evaluate for  TVAR vs  Standard aortic valve replacement.  There is no doubt with the patient's history a tissue valve is indicated without use of Coumadin  2/ drug-eluting stent placement in the distal right coronary reducing the 60% stenosis to 0% with TIMI grade 3 flow. Vessel occlusion during the procedure reproduced the patient's exertional angina 06/17/2016  Continues to be slow recovering from appendectomy with right lower quadrant discomfort  Continued phlegmon right lower quadrant currently treated with ampicillin and improving by symptoms,  He has a follow-up appointment with general surgery tomorrow   Following cardiac catheterization will discuss with Dr. Burt Knack pros and cons of TVAR  versus standard aortic valve replacement.   Grace Isaac MD      [address] [place] [address] Office [phone]   Beeper [phone]  12/30/2015 5:18 PM

## 2016-01-03 DIAGNOSIS — Z0000 Encounter for general adult medical examination without abnormal findings: Secondary | ICD-10-CM | POA: Diagnosis not present

## 2016-01-03 DIAGNOSIS — R350 Frequency of micturition: Secondary | ICD-10-CM | POA: Diagnosis not present

## 2016-01-03 DIAGNOSIS — N3040 Irradiation cystitis without hematuria: Secondary | ICD-10-CM | POA: Diagnosis not present

## 2016-01-05 ENCOUNTER — Encounter: Payer: Self-pay | Admitting: Cardiovascular Disease

## 2016-01-05 ENCOUNTER — Ambulatory Visit (INDEPENDENT_AMBULATORY_CARE_PROVIDER_SITE_OTHER): Payer: Medicare Other | Admitting: Cardiovascular Disease

## 2016-01-05 VITALS — BP 128/80 | HR 80 | Ht 72.0 in | Wt 142.0 lb

## 2016-01-05 DIAGNOSIS — I208 Other forms of angina pectoris: Secondary | ICD-10-CM

## 2016-01-05 DIAGNOSIS — I25118 Atherosclerotic heart disease of native coronary artery with other forms of angina pectoris: Secondary | ICD-10-CM

## 2016-01-05 DIAGNOSIS — I359 Nonrheumatic aortic valve disorder, unspecified: Secondary | ICD-10-CM | POA: Diagnosis not present

## 2016-01-05 DIAGNOSIS — I350 Nonrheumatic aortic (valve) stenosis: Secondary | ICD-10-CM | POA: Diagnosis not present

## 2016-01-05 LAB — CBC
HEMATOCRIT: 35 % — AB (ref 39.0–52.0)
Hemoglobin: 11.4 g/dL — ABNORMAL LOW (ref 13.0–17.0)
MCH: 29.2 pg (ref 26.0–34.0)
MCHC: 32.6 g/dL (ref 30.0–36.0)
MCV: 89.7 fL (ref 78.0–100.0)
MPV: 9.6 fL (ref 8.6–12.4)
Platelets: 226 10*3/uL (ref 150–400)
RBC: 3.9 MIL/uL — AB (ref 4.22–5.81)
RDW: 16.1 % — AB (ref 11.5–15.5)
WBC: 5.3 10*3/uL (ref 4.0–10.5)

## 2016-01-05 LAB — BASIC METABOLIC PANEL
BUN: 15 mg/dL (ref 7–25)
CO2: 28 mmol/L (ref 20–31)
Calcium: 8.9 mg/dL (ref 8.6–10.3)
Chloride: 98 mmol/L (ref 98–110)
Creat: 0.91 mg/dL (ref 0.70–1.18)
Glucose, Bld: 72 mg/dL (ref 65–99)
POTASSIUM: 4.2 mmol/L (ref 3.5–5.3)
SODIUM: 133 mmol/L — AB (ref 135–146)

## 2016-01-05 LAB — PROTIME-INR
INR: 1.12 (ref <1.50)
Prothrombin Time: 14.5 seconds (ref 11.6–15.2)

## 2016-01-05 MED ORDER — CLOPIDOGREL BISULFATE 75 MG PO TABS: 75.0000 mg | ORAL_TABLET | Freq: Every day | ORAL | Status: DC

## 2016-01-05 NOTE — Patient Instructions (Signed)
Medication Instructions:  Your physician recommends that you continue on your current medications as directed. Please refer to the Current Medication list given to you today.  Labwork: Your physician recommends that you have lab work today: BMP, CBC and PT/INR  Testing/Procedures: Your physician has requested that you have a cardiac catheterization. Cardiac catheterization is used to diagnose and/or treat various heart conditions. Doctors may recommend this procedure for a number of different reasons. The most common reason is to evaluate chest pain. Chest pain can be a symptom of coronary artery disease (CAD), and cardiac catheterization can show whether plaque is narrowing or blocking your heart's arteries. This procedure is also used to evaluate the valves, as well as measure the blood flow and oxygen levels in different parts of your heart. For further information please visit [email or web address]. Please follow instruction sheet, as given.  Follow-Up: You will follow-up with Dr [name] after cardiac catheterization.   Any Other Special Instructions Will Be Listed Below (If Applicable).     If you need a refill on your cardiac medications before your next appointment, please call your pharmacy.

## 2016-01-05 NOTE — Progress Notes (Signed)
Cardiology Office Note Date:  01/12/2016   ID:  Corey Oneill, DOB January 15, 1936, MRN [id number]  PCP:  [name], MD  Cardiologist:  [name], MD    Chief Complaint  Patient presents with  . Chest Pain   History of Present Illness: Corey Oneill is a 80 y.o. male who presents for aortic stenosis and coronary artery disease. He has undergone PCI of the RCA in 2016 under pressure-wire guidance. Moderate residual stenosis in the left circumflex has been noted. He also has had moderate-severe aortic stenosis with associated chest pain and dizziness and has been evaluated by Dr [name]. The patient developed acute appendicitis 11/12/2015 and was treated with appendectomy. He's had some residual symptoms since hospital discharge and FU CT has shown a phlegmon in the right lower quadrant. He has now been seen in follow-up by Dr [name] and has been cleared to move forward with cardiac evaluation and treatment. He reports no change in clinical symptoms since I have last seen him. He continues to have episodes of chest pain and dizziness. He denies exertional dyspnea. No edema, orthopnea, or PND.    Past Medical History  Diagnosis Date  . Hyperlipidemia   . Esophageal reflux   . Hx of radiation therapy 04/14/13-06/09/13    prostate 7800 cGy, 40 sessions, seminal vesicles 5600 cGy 40 sessions  . CAD (coronary artery disease)     a.  LHC 8/16: Mid to distal LAD 30%, OM1 40%, proximal mid RCA 40%, distal RCA 60% >> FFR 0.69  >> PCI: 3 x 15 mm Resolute DES  . Aortic stenosis     a. peak to peak gradient by LHC 8/16:  37 mmHg (moderately severe)   . Radiation proctitis   . Family history of adverse reaction to anesthesia     "daughter has PONV"  . Heart murmur   . Anginal pain ([place])   . H/O blood clots     "get them in my stool and urine" (11/12/2015)  . Depression   . Prostate cancer [facility]) dx'd 2014    Past Surgical History  Procedure Laterality Date  . Nasal fracture surgery       "broken years ago; several ORs to correct it"  . Tonsillectomy    . Prostate biopsy  2014    "needle biopsy"  . Colonoscopy    . Cardiac catheterization N/A 04/21/2015    Procedure: Right/Left Heart Cath and Coronary Angiography;  Surgeon: [name], MD;  Location: [place] [facility];  Service: Cardiovascular;  Laterality: N/A;  . Cardiac catheterization N/A 06/18/2015    Procedure: Intravascular Pressure Wire/FFR Study;  Surgeon: [name], MD;  Location: [place] [facility];  Service: Cardiovascular;  Laterality: N/A;  . Cardiac catheterization N/A 06/18/2015    Procedure: Coronary Stent Intervention;  Surgeon: [name], MD;  Location: [place] [facility];  Service: Cardiovascular;  Laterality: N/A;  . Cardiac catheterization N/A 06/18/2015    Procedure: Right/Left Heart Cath and Coronary Angiography;  Surgeon: [name], MD;  Location: [place] [facility];  Service: Cardiovascular;  Laterality: N/A;  . Cardiac catheterization N/A 06/23/2015    Procedure: Left Heart Cath and Cors/Grafts Angiography;  Surgeon: [name], MD;  Location: [place] [facility];  Service: Cardiovascular;  Laterality: N/A;  . Nasal septum surgery    . Inguinal hernia repair    . Fracture surgery    . Laparoscopic appendectomy N/A 11/16/2015    Procedure: APPENDECTOMY LAPAROSCOPIC;  Surgeon: [name], MD;  [facility];  Service: General;  Laterality: N/A;    Current Outpatient Prescriptions  Medication Sig Dispense Refill  . acetaminophen (TYLENOL) 500 MG tablet Take 500 mg by mouth at bedtime. Reported on 11/26/2015    . albuterol (PROVENTIL HFA;VENTOLIN HFA) 108 (90 Base) MCG/ACT inhaler Inhale 1-2 puffs into the lungs at bedtime.    [name] aspirin EC 81 MG tablet Take 81 mg by mouth daily.    [name] buPROPion (WELLBUTRIN SR) 100 MG 12 hr tablet Take 100 mg by mouth daily.     . clopidogrel (PLAVIX) 75 MG tablet Take 1 tablet (75 mg total) by mouth daily. 90 tablet 3  . ferrous fumarate (HEMOCYTE  - 106 MG FE) 325 (106 FE) MG TABS tablet Take 1 tablet (106 mg of iron total) by mouth daily. 30 each 0  . FLUoxetine (PROZAC) 10 MG capsule Take 1 capsule (10 mg total) by mouth daily. 90 capsule 3  . Magnesium 250 MG TABS Take 250 mg by mouth at bedtime.     . nitroGLYCERIN (NITROSTAT) 0.4 MG SL tablet Place 0.4 mg under the tongue every 5 (five) minutes as needed for chest pain. Reported on 11/26/2015    . oxyCODONE (OXY IR/ROXICODONE) 5 MG immediate release tablet Take 1-2 tablets (5-10 mg total) by mouth every 6 (six) hours as needed for moderate pain, severe pain or breakthrough pain. 40 tablet 0  . rosuvastatin (CRESTOR) 10 MG tablet Take 10 mg by mouth at bedtime.     No current facility-administered medications for this visit.    Allergies:   Review of patient's allergies indicates no known allergies.   Social History:  The patient  reports that he has quit smoking. His smoking use included Cigarettes. He smoked 0.00 packs per day. He has never used smokeless tobacco. He reports that he drinks alcohol. He reports that he does not use illicit drugs.   Family History:  The patient's  family history includes Brain cancer in his father; Depression in his daughter; Lymphoma in his mother.    ROS:  Please see the history of present illness.  Otherwise, review of systems is positive for weight loss (unexplained), appetite change, hearing loss, blood in stool, depression, back pain, balance problems.  All other systems are reviewed and negative.    PHYSICAL EXAM: VS:  BP 128/80 mmHg  Pulse 80  Ht 6' (1.829 m)  Wt 64.411 kg (142 lb)  BMI 19.25 kg/m2 , BMI Body mass index is 19.25 kg/(m^2). GEN: Well nourished, well developed, in no acute distress HEENT: normal Neck: no JVD, no masses. No carotid bruits Cardiac: RRR with 2/6 systolic murmur at the RUSB, A2 diminished               Respiratory:  clear to auscultation bilaterally, normal work of breathing GI: soft, nontender, nondistended,  + BS MS: no deformity or atrophy Ext: no pretibial edema, pedal pulses 2+= bilaterally Skin: warm and dry, no rash Neuro:  Strength and sensation are intact Psych: euthymic mood, full affect  EKG:  EKG is not ordered today.  Recent Labs: 07/23/2015: Pro B Natriuretic peptide (BNP) 109.0* 12/13/2015: TSH 3.455 12/23/2015: ALT 16* 01/05/2016: BUN 15; Creat 0.91; Hemoglobin 11.4*; Platelets 226; Potassium 4.2; Sodium 133*   Lipid Panel     Component Value Date/Time   CHOL 155 06/18/2015 0234   TRIG 99 06/18/2015 0234   HDL 49 06/18/2015 0234   CHOLHDL 3.2 06/18/2015 0234  VLDL 20 06/18/2015 0234   LDLCALC 86 06/18/2015 0234      Wt Readings from Last 3 Encounters:  01/05/16 64.411 kg (142 lb)  12/30/15 61.236 kg (135 lb)  12/23/15 61.553 kg (135 lb 11.2 oz)     Cardiac Studies Reviewed: 06/23/2015:  Procedures    Left Heart Cath and Cors/Grafts Angiography    Conclusion     Widely patent distal right coronary stent.  Diffuse proximal to mid RCA disease with up to 50% narrowing, unchanged from the prior study.  40-50% mid first obtuse marginal unchanged from prior study. Widely patent LAD with diffuse plaquing up to 30-40%.  No new source for angina is identified. The current episode of chest pain if cardiac could've been related to coronary spasm. No evidence of thrombus is noted.  Left ventriculography was not performed    RECOMMENDATION:   Continue medical therapy. Consider adding long-acting nitrates if tolerated by blood pressure.   2D Echo 11/01/2015: Study Conclusions  - Left ventricle: The cavity size was normal. There was mild concentric hypertrophy. Systolic function was normal. The estimated ejection fraction was in the range of 55% to 60%. Wall motion was normal; there were no regional wall motion abnormalities. Doppler parameters are consistent with abnormal left ventricular relaxation (grade 1 diastolic dysfunction). - Aortic valve:  Valve mobility was restricted. There was severe stenosis. Valve area (Vmax): 0.82 cm^2. - Mitral valve: Moderately calcified annulus. - Right ventricle: The cavity size was normal. Wall thickness was normal. Systolic function was normal. - Tricuspid valve: There was trivial regurgitation. - Inferior vena cava: The vessel was normal in size. The respirophasic diameter changes were in the normal range (>= 50%), consistent with normal central venous pressure.  Impressions:  - Although peak velocity and mean gradient calculate as moderate, there appears to be severe stenosis of the aortic valve. The calculated valve area indicates severe stenosis. It is likely that the peak gradient was not identified in this study. Study is limited by poor sound transmission.   ASSESSMENT AND PLAN: 1.  Moderate/Severe Aortic Stenosis: NYHA functional class II 2. CAD, native vessel, with atypical angina - chronic 3. Rectal bleeding/radiation proctitis 4. Unexplained weight loss - chronic  I have discussed his case extensively with Dr [name]. I think best to move forward with further evaluation of his CAD and symptomatic aortic stenosis. Recommend repeat right and left heart catheterization. I have reviewed the risks, indications, and alternatives to cardiac catheterization, possible angioplasty, and stenting with the patient. Risks include but are not limited to bleeding, infection, vascular injury, stroke, myocardial infection, arrhythmia, kidney injury, radiation-related injury in the case of prolonged fluoroscopy use, emergency cardiac surgery, and death. The patient understands the risks of serious complication is 1-2 in [id number] with diagnostic cardiac cath and 1-2% or less with angioplasty/stenting. Further plans/recommendations pending cath results. Extent of CAD will impact treatment options, specifically will help dictate whether whether CABG/AVR or TAVR may be best treatment option for  him.  Current medicines are reviewed with the patient today.  The patient does not have concerns regarding medicines.  Labs/ tests ordered today include:   Orders Placed This Encounter  Procedures  . Basic Metabolic Panel (BMET)  . CBC  . INR/PT    Disposition:   FU pending cath results  Signed, [name], MD  01/12/2016 6:03 PM    [facility] [address], [place], [place]  [address] Phone: [phone]; Fax: [phone]

## 2016-01-11 ENCOUNTER — Ambulatory Visit: Payer: Medicare Other | Admitting: Nurse Practitioner

## 2016-01-12 ENCOUNTER — Other Ambulatory Visit: Payer: Self-pay | Admitting: Cardiovascular Disease

## 2016-01-13 ENCOUNTER — Telehealth: Payer: Self-pay | Admitting: Cardiovascular Disease

## 2016-01-13 NOTE — Telephone Encounter (Signed)
Pt says that he received a call from a nurse

## 2016-01-13 NOTE — Telephone Encounter (Signed)
Left detailed message that the instructions have been sent in the mail over a week ago and he should be receiving them. Advised unable to email to his personal email and he does not use MyChart. Also advised to call back and we can review the instruction letter with him.

## 2016-01-13 NOTE — Telephone Encounter (Signed)
Left pt a detail message, and to call back.

## 2016-01-13 NOTE — Telephone Encounter (Signed)
New message      Pt is having a cath on Monday.  Are there any instructions for him?  He want to know if someone can email them to him

## 2016-01-15 DIAGNOSIS — M545 Low back pain: Secondary | ICD-10-CM | POA: Diagnosis not present

## 2016-01-17 ENCOUNTER — Encounter (HOSPITAL_COMMUNITY): Payer: Self-pay | Admitting: Cardiovascular Disease

## 2016-01-17 ENCOUNTER — Ambulatory Visit (HOSPITAL_COMMUNITY)
Admission: RE | Admit: 2016-01-17 | Discharge: 2016-01-17 | Disposition: A | Discharge status: Home / Self Care | Payer: Medicare Other | Source: Ambulatory Visit | Attending: Cardiovascular Disease | Admitting: Cardiovascular Disease

## 2016-01-17 ENCOUNTER — Encounter (HOSPITAL_COMMUNITY)
Admission: RE | Disposition: A | Discharge status: Home / Self Care | Payer: Self-pay | Source: Ambulatory Visit | Attending: Cardiovascular Disease

## 2016-01-17 DIAGNOSIS — I350 Nonrheumatic aortic (valve) stenosis: Secondary | ICD-10-CM

## 2016-01-17 DIAGNOSIS — Z7982 Long term (current) use of aspirin: Secondary | ICD-10-CM | POA: Diagnosis not present

## 2016-01-17 DIAGNOSIS — K625 Hemorrhage of anus and rectum: Secondary | ICD-10-CM | POA: Diagnosis not present

## 2016-01-17 DIAGNOSIS — R42 Dizziness and giddiness: Secondary | ICD-10-CM | POA: Diagnosis not present

## 2016-01-17 DIAGNOSIS — Z681 Body mass index (BMI) 19 or less, adult: Secondary | ICD-10-CM | POA: Insufficient documentation

## 2016-01-17 DIAGNOSIS — R634 Abnormal weight loss: Secondary | ICD-10-CM | POA: Diagnosis not present

## 2016-01-17 DIAGNOSIS — R079 Chest pain, unspecified: Secondary | ICD-10-CM | POA: Diagnosis not present

## 2016-01-17 DIAGNOSIS — K219 Gastro-esophageal reflux disease without esophagitis: Secondary | ICD-10-CM | POA: Insufficient documentation

## 2016-01-17 DIAGNOSIS — E785 Hyperlipidemia, unspecified: Secondary | ICD-10-CM | POA: Insufficient documentation

## 2016-01-17 DIAGNOSIS — F329 Major depressive disorder, single episode, unspecified: Secondary | ICD-10-CM | POA: Diagnosis not present

## 2016-01-17 DIAGNOSIS — I2510 Atherosclerotic heart disease of native coronary artery without angina pectoris: Secondary | ICD-10-CM | POA: Diagnosis not present

## 2016-01-17 DIAGNOSIS — Z955 Presence of coronary angioplasty implant and graft: Secondary | ICD-10-CM | POA: Diagnosis not present

## 2016-01-17 DIAGNOSIS — I2584 Coronary atherosclerosis due to calcified coronary lesion: Secondary | ICD-10-CM | POA: Insufficient documentation

## 2016-01-17 DIAGNOSIS — Z7902 Long term (current) use of antithrombotics/antiplatelets: Secondary | ICD-10-CM | POA: Insufficient documentation

## 2016-01-17 DIAGNOSIS — Z87891 Personal history of nicotine dependence: Secondary | ICD-10-CM | POA: Insufficient documentation

## 2016-01-17 DIAGNOSIS — K627 Radiation proctitis: Secondary | ICD-10-CM | POA: Diagnosis not present

## 2016-01-17 DIAGNOSIS — Z8546 Personal history of malignant neoplasm of prostate: Secondary | ICD-10-CM | POA: Diagnosis not present

## 2016-01-17 HISTORY — PX: CARDIAC CATHETERIZATION: SHX172

## 2016-01-17 LAB — POCT I-STAT 3, ART BLOOD GAS (G3+)
ACID-BASE DEFICIT: 1 mmol/L (ref 0.0–2.0)
BICARBONATE: 24.9 meq/L — AB (ref 20.0–24.0)
O2 SAT: 93 %
TCO2: 26 mmol/L (ref 0–100)
pCO2 arterial: 45.5 mmHg — ABNORMAL HIGH (ref 35.0–45.0)
pH, Arterial: 7.345 — ABNORMAL LOW (ref 7.350–7.450)
pO2, Arterial: 73 mmHg — ABNORMAL LOW (ref 80.0–100.0)

## 2016-01-17 LAB — POCT I-STAT 3, VENOUS BLOOD GAS (G3P V)
Acid-base deficit: 1 mmol/L (ref 0.0–2.0)
BICARBONATE: 25.3 meq/L — AB (ref 20.0–24.0)
O2 Saturation: 70 %
PCO2 VEN: 47.9 mmHg (ref 45.0–50.0)
TCO2: 27 mmol/L (ref 0–100)
pH, Ven: 7.332 — ABNORMAL HIGH (ref 7.250–7.300)
pO2, Ven: 39 mmHg (ref 30.0–45.0)

## 2016-01-17 SURGERY — RIGHT/LEFT HEART CATH AND CORONARY ANGIOGRAPHY

## 2016-01-17 MED ORDER — IOHEXOL 350 MG/ML SOLN
INTRAVENOUS | Status: DC | PRN
  Administered 2016-01-17: 70 mL via INTRA_ARTERIAL

## 2016-01-17 MED ORDER — SODIUM CHLORIDE 0.9% FLUSH: 3.0000 mL | Freq: Two times a day (BID) | INTRAVENOUS | Status: DC

## 2016-01-17 MED ORDER — SODIUM CHLORIDE 0.9 % WEIGHT BASED INFUSION: 1.0000 mL/kg/h | INTRAVENOUS | Status: DC

## 2016-01-17 MED ORDER — FENTANYL CITRATE (PF) 100 MCG/2ML IJ SOLN
INTRAMUSCULAR | Status: DC | PRN
  Administered 2016-01-17: 25 ug via INTRAVENOUS

## 2016-01-17 MED ORDER — SODIUM CHLORIDE 0.9 % IV SOLN: 250.0000 mL | INTRAVENOUS | Status: DC | PRN

## 2016-01-17 MED ORDER — MIDAZOLAM HCL 2 MG/2ML IJ SOLN
INTRAMUSCULAR | Status: DC | PRN
  Administered 2016-01-17: 2 mg via INTRAVENOUS

## 2016-01-17 MED ORDER — ACETAMINOPHEN 325 MG PO TABS: 650.0000 mg | ORAL_TABLET | ORAL | Status: DC | PRN

## 2016-01-17 MED ORDER — HEPARIN (PORCINE) IN NACL 2-0.9 UNIT/ML-% IJ SOLN
INTRAMUSCULAR | Status: AC
  Filled 2016-01-17: qty 500

## 2016-01-17 MED ORDER — SODIUM CHLORIDE 0.9 % IV SOLN: INTRAVENOUS | Status: DC

## 2016-01-17 MED ORDER — SODIUM CHLORIDE 0.9% FLUSH: 3.0000 mL | INTRAVENOUS | Status: DC | PRN

## 2016-01-17 MED ORDER — SODIUM CHLORIDE 0.9 % WEIGHT BASED INFUSION
3.0000 mL/kg/h | INTRAVENOUS | Status: AC
  Administered 2016-01-17: 3 mL/kg/h via INTRAVENOUS

## 2016-01-17 MED ORDER — ONDANSETRON HCL 4 MG/2ML IJ SOLN: 4.0000 mg | Freq: Four times a day (QID) | INTRAMUSCULAR | Status: DC | PRN

## 2016-01-17 MED ORDER — MIDAZOLAM HCL 2 MG/2ML IJ SOLN
INTRAMUSCULAR | Status: AC
  Filled 2016-01-17: qty 2

## 2016-01-17 MED ORDER — FENTANYL CITRATE (PF) 100 MCG/2ML IJ SOLN
INTRAMUSCULAR | Status: AC
  Filled 2016-01-17: qty 2

## 2016-01-17 MED ORDER — LIDOCAINE HCL (PF) 1 % IJ SOLN
INTRAMUSCULAR | Status: DC | PRN
  Administered 2016-01-17: 20 mL via INTRADERMAL

## 2016-01-17 MED ORDER — HEPARIN (PORCINE) IN NACL 2-0.9 UNIT/ML-% IJ SOLN
INTRAMUSCULAR | Status: DC | PRN
  Administered 2016-01-17: 1000 mL

## 2016-01-17 MED ORDER — LIDOCAINE HCL (PF) 1 % IJ SOLN
INTRAMUSCULAR | Status: AC
  Filled 2016-01-17: qty 30

## 2016-01-17 SURGICAL SUPPLY — 13 items
CATH INFINITI 5FR AL1 (CATHETERS) ×2 IMPLANT
CATH INFINITI 5FR MULTPACK ANG (CATHETERS) ×2 IMPLANT
CATH SWAN GANZ 7F STRAIGHT (CATHETERS) ×2 IMPLANT
KIT HEART LEFT (KITS) ×3 IMPLANT
KIT HEART RIGHT NAMIC (KITS) ×3 IMPLANT
PACK CARDIAC CATHETERIZATION (CUSTOM PROCEDURE TRAY) ×3 IMPLANT
SHEATH PINNACLE 5F 10CM (SHEATH) ×2 IMPLANT
SHEATH PINNACLE 7F 10CM (SHEATH) ×2 IMPLANT
SYR MEDRAD MARK V 150ML (SYRINGE) ×3 IMPLANT
TRANSDUCER W/STOPCOCK (MISCELLANEOUS) ×4 IMPLANT
WIRE EMERALD 3MM-J .035X150CM (WIRE) ×2 IMPLANT
WIRE EMERALD 3MM-J .035X260CM (WIRE) ×2 IMPLANT
WIRE EMERALD ST .035X150CM (WIRE) ×2 IMPLANT

## 2016-01-17 NOTE — Progress Notes (Signed)
Pt took his ASA and Plavix at home this am at 0500

## 2016-01-17 NOTE — Interval H&P Note (Signed)
History and Physical Interval Note:  01/17/2016 7:32 AM  Corey Oneill  has presented today for surgery, with the diagnosis of cad/arotic stenosis  The various methods of treatment have been discussed with the patient and family. After consideration of risks, benefits and other options for treatment, the patient has consented to  Procedure(s): Right/Left Heart Cath and Coronary Angiography (N/A) as a surgical intervention .  The patient's history has been reviewed, patient examined, no change in status, stable for surgery.  I have reviewed the patient's chart and labs.  Questions were answered to the patient's satisfaction.     [name]

## 2016-01-17 NOTE — Telephone Encounter (Signed)
Pt currently admitted for cath.

## 2016-01-17 NOTE — Discharge Instructions (Signed)
Angiogram, Care After °Refer to this sheet in the next few weeks. These instructions provide you with information about caring for yourself after your procedure. Your health care provider may also give you more specific instructions. Your treatment has been planned according to current medical practices, but problems sometimes occur. Call your health care provider if you have any problems or questions after your procedure. °WHAT TO EXPECT AFTER THE PROCEDURE °After your procedure, it is typical to have the following: °· Bruising at the catheter insertion site that usually fades within 1-2 weeks. °· Blood collecting in the tissue (hematoma) that may be painful to the touch. It should usually decrease in size and tenderness within 1-2 weeks. °HOME CARE INSTRUCTIONS °· Take medicines only as directed by your health care provider. °· You may shower 24-48 hours after the procedure or as directed by your health care provider. Remove the bandage (dressing) and gently wash the site with plain soap and water. Pat the area dry with a clean towel. Do not rub the site, because this may cause bleeding. °· Do not take baths, swim, or use a hot tub until your health care provider approves. °· Check your insertion site every day for redness, swelling, or drainage. °· Do not apply powder or lotion to the site. °· Do not lift over 10 lb (4.5 kg) for 5 days after your procedure or as directed by your health care provider. °· Ask your health care provider when it is okay to: °¨ Return to work or school. °¨ Resume usual physical activities or sports. °¨ Resume sexual activity. °· Do not drive home if you are discharged the same day as the procedure. Have someone else drive you. °· You may drive 24 hours after the procedure unless otherwise instructed by your health care provider. °· Do not operate machinery or power tools for 24 hours after the procedure or as directed by your health care provider. °· If your procedure was done as an  outpatient procedure, which means that you went home the same day as your procedure, a responsible adult should be with you for the first 24 hours after you arrive home. °· Keep all follow-up visits as directed by your health care provider. This is important. °SEEK MEDICAL CARE IF: °· You have a fever. °· You have chills. °· You have increased bleeding from the catheter insertion site. Hold pressure on the site. °SEEK IMMEDIATE MEDICAL CARE IF: °· You have unusual pain at the catheter insertion site. °· You have redness, warmth, or swelling at the catheter insertion site. °· You have drainage (other than a small amount of blood on the dressing) from the catheter insertion site. °· The catheter insertion site is bleeding, and the bleeding does not stop after 30 minutes of holding steady pressure on the site. °· The area near or just beyond the catheter insertion site becomes pale, cool, tingly, or numb. °  °This information is not intended to replace advice given to you by your health care provider. Make sure you discuss any questions you have with your health care provider. °  °Document Released: 05/18/2005 Document Revised: 11/20/2014 Document Reviewed: 04/02/2013 °[employer]vier Interactive Patient Education ©2016 [employer] ° °

## 2016-01-17 NOTE — H&P (View-Only) (Signed)
Cardiology Office Note Date:  01/12/2016   ID:  Corey Oneill, DOB 11-17-1935, MRN [id number]  PCP:  Corey Haste, MD  Cardiologist:  [name], MD    Chief Complaint  Patient presents with  . Chest Pain   History of Present Illness: Corey Oneill is a 80 y.o. male who presents for aortic stenosis and coronary artery disease. He has undergone PCI of the RCA in 2016 under pressure-wire guidance. Moderate residual stenosis in the left circumflex has been noted. He also has had moderate-severe aortic stenosis with associated chest pain and dizziness and has been evaluated by Dr Corey Oneill. The patient developed acute appendicitis 11/12/2015 and was treated with appendectomy. He's had some residual symptoms since hospital discharge and FU CT has shown a phlegmon in the right lower quadrant. He has now been seen in follow-up by Dr Corey Oneill and has been cleared to move forward with cardiac evaluation and treatment. He reports no change in clinical symptoms since I have last seen him. He continues to have episodes of chest pain and dizziness. He denies exertional dyspnea. No edema, orthopnea, or PND.    Past Medical History  Diagnosis Date  . Hyperlipidemia   . Esophageal reflux   . Hx of radiation therapy 04/14/13-06/09/13    prostate 7800 cGy, 40 sessions, seminal vesicles 5600 cGy 40 sessions  . CAD (coronary artery disease)     a.  LHC 8/16: Mid to distal LAD 30%, OM1 40%, proximal mid RCA 40%, distal RCA 60% >> FFR 0.69  >> PCI: 3 x 15 mm Resolute DES  . Aortic stenosis     a. peak to peak gradient by LHC 8/16:  37 mmHg (moderately severe)   . Radiation proctitis   . Family history of adverse reaction to anesthesia     "daughter has PONV"  . Heart murmur   . Anginal pain ([place])   . H/O blood clots     "get them in my stool and urine" (11/12/2015)  . Depression   . Prostate cancer [facility]) dx'd 2014    Past Surgical History  Procedure Laterality Date  . Nasal fracture surgery       "broken years ago; several ORs to correct it"  . Tonsillectomy    . Prostate biopsy  2014    "needle biopsy"  . Colonoscopy    . Cardiac catheterization N/A 04/21/2015    Procedure: Right/Left Heart Cath and Coronary Angiography;  Surgeon: [name], MD;  Location: [place] [facility];  Service: Cardiovascular;  Laterality: N/A;  . Cardiac catheterization N/A 06/18/2015    Procedure: Intravascular Pressure Wire/FFR Study;  Surgeon: Corey Crome, MD;  Location: [place] [facility];  Service: Cardiovascular;  Laterality: N/A;  . Cardiac catheterization N/A 06/18/2015    Procedure: Coronary Stent Intervention;  Surgeon: Corey Crome, MD;  Location: [place] [facility];  Service: Cardiovascular;  Laterality: N/A;  . Cardiac catheterization N/A 06/18/2015    Procedure: Right/Left Heart Cath and Coronary Angiography;  Surgeon: Corey Crome, MD;  Location: [place] [facility];  Service: Cardiovascular;  Laterality: N/A;  . Cardiac catheterization N/A 06/23/2015    Procedure: Left Heart Cath and Cors/Grafts Angiography;  Surgeon: Corey Crome, MD;  Location: [place] [facility];  Service: Cardiovascular;  Laterality: N/A;  . Nasal septum surgery    . Inguinal hernia repair    . Fracture surgery    . Laparoscopic appendectomy N/A 11/16/2015    Procedure: APPENDECTOMY LAPAROSCOPIC;  Surgeon: Corey Luna, MD;  Location: [facility] OR;  Service: General;  Laterality: N/A;    Current Outpatient Prescriptions  Medication Sig Dispense Refill  . acetaminophen (TYLENOL) 500 MG tablet Take 500 mg by mouth at bedtime. Reported on 11/26/2015    . albuterol (PROVENTIL HFA;VENTOLIN HFA) 108 (90 Base) MCG/ACT inhaler Inhale 1-2 puffs into the lungs at bedtime.    [name] aspirin EC 81 MG tablet Take 81 mg by mouth daily.    [name] buPROPion (WELLBUTRIN SR) 100 MG 12 hr tablet Take 100 mg by mouth daily.     . clopidogrel (PLAVIX) 75 MG tablet Take 1 tablet (75 mg total) by mouth daily. 90 tablet 3  . ferrous fumarate (HEMOCYTE  - 106 MG FE) 325 (106 FE) MG TABS tablet Take 1 tablet (106 mg of iron total) by mouth daily. 30 each 0  . FLUoxetine (PROZAC) 10 MG capsule Take 1 capsule (10 mg total) by mouth daily. 90 capsule 3  . Magnesium 250 MG TABS Take 250 mg by mouth at bedtime.     . nitroGLYCERIN (NITROSTAT) 0.4 MG SL tablet Place 0.4 mg under the tongue every 5 (five) minutes as needed for chest pain. Reported on 11/26/2015    . oxyCODONE (OXY IR/ROXICODONE) 5 MG immediate release tablet Take 1-2 tablets (5-10 mg total) by mouth every 6 (six) hours as needed for moderate pain, severe pain or breakthrough pain. 40 tablet 0  . rosuvastatin (CRESTOR) 10 MG tablet Take 10 mg by mouth at bedtime.     No current facility-administered medications for this visit.    Allergies:   Review of patient's allergies indicates no known allergies.   Social History:  The patient  reports that he has quit smoking. His smoking use included Cigarettes. He smoked 0.00 packs per day. He has never used smokeless tobacco. He reports that he drinks alcohol. He reports that he does not use illicit drugs.   Family History:  The patient's  family history includes Brain cancer in his father; Depression in his daughter; Lymphoma in his mother.    ROS:  Please see the history of present illness.  Otherwise, review of systems is positive for weight loss (unexplained), appetite change, hearing loss, blood in stool, depression, back pain, balance problems.  All other systems are reviewed and negative.    PHYSICAL EXAM: VS:  BP 128/80 mmHg  Pulse 80  Ht 6' (1.829 m)  Wt 64.411 kg (142 lb)  BMI 19.25 kg/m2 , BMI Body mass index is 19.25 kg/(m^2). GEN: Well nourished, well developed, in no acute distress HEENT: normal Neck: no JVD, no masses. No carotid bruits Cardiac: RRR with 2/6 systolic murmur at the RUSB, A2 diminished               Respiratory:  clear to auscultation bilaterally, normal work of breathing GI: soft, nontender, nondistended,  + BS MS: no deformity or atrophy Ext: no pretibial edema, pedal pulses 2+= bilaterally Skin: warm and dry, no rash Neuro:  Strength and sensation are intact Psych: euthymic mood, full affect  EKG:  EKG is not ordered today.  Recent Labs: 07/23/2015: Pro B Natriuretic peptide (BNP) 109.0* 12/13/2015: TSH 3.455 12/23/2015: ALT 16* 01/05/2016: BUN 15; Creat 0.91; Hemoglobin 11.4*; Platelets 226; Potassium 4.2; Sodium 133*   Lipid Panel     Component Value Date/Time   CHOL 155 06/18/2015 0234   TRIG 99 06/18/2015 0234   HDL 49 06/18/2015 0234   CHOLHDL 3.2 06/18/2015 0234  VLDL 20 06/18/2015 0234   LDLCALC 86 06/18/2015 0234      Wt Readings from Last 3 Encounters:  01/05/16 64.411 kg (142 lb)  12/30/15 61.236 kg (135 lb)  12/23/15 61.553 kg (135 lb 11.2 oz)     Cardiac Studies Reviewed: 06/23/2015:  Procedures    Left Heart Cath and Cors/Grafts Angiography    Conclusion     Widely patent distal right coronary stent.  Diffuse proximal to mid RCA disease with up to 50% narrowing, unchanged from the prior study.  40-50% mid first obtuse marginal unchanged from prior study. Widely patent LAD with diffuse plaquing up to 30-40%.  No new source for angina is identified. The current episode of chest pain if cardiac could've been related to coronary spasm. No evidence of thrombus is noted.  Left ventriculography was not performed    RECOMMENDATION:   Continue medical therapy. Consider adding long-acting nitrates if tolerated by blood pressure.   2D Echo 11/01/2015: Study Conclusions  - Left ventricle: The cavity size was normal. There was mild concentric hypertrophy. Systolic function was normal. The estimated ejection fraction was in the range of 55% to 60%. Wall motion was normal; there were no regional wall motion abnormalities. Doppler parameters are consistent with abnormal left ventricular relaxation (grade 1 diastolic dysfunction). - Aortic valve:  Valve mobility was restricted. There was severe stenosis. Valve area (Vmax): 0.82 cm^2. - Mitral valve: Moderately calcified annulus. - Right ventricle: The cavity size was normal. Wall thickness was normal. Systolic function was normal. - Tricuspid valve: There was trivial regurgitation. - Inferior vena cava: The vessel was normal in size. The respirophasic diameter changes were in the normal range (>= 50%), consistent with normal central venous pressure.  Impressions:  - Although peak velocity and mean gradient calculate as moderate, there appears to be severe stenosis of the aortic valve. The calculated valve area indicates severe stenosis. It is likely that the peak gradient was not identified in this study. Study is limited by poor sound transmission.   ASSESSMENT AND PLAN: 1.  Moderate/Severe Aortic Stenosis: NYHA functional class II 2. CAD, native vessel, with atypical angina - chronic 3. Rectal bleeding/radiation proctitis 4. Unexplained weight loss - chronic  I have discussed his case extensively with Dr Corey Oneill. I think best to move forward with further evaluation of his CAD and symptomatic aortic stenosis. Recommend repeat right and left heart catheterization. I have reviewed the risks, indications, and alternatives to cardiac catheterization, possible angioplasty, and stenting with the patient. Risks include but are not limited to bleeding, infection, vascular injury, stroke, myocardial infection, arrhythmia, kidney injury, radiation-related injury in the case of prolonged fluoroscopy use, emergency cardiac surgery, and death. The patient understands the risks of serious complication is 1-2 in [id number] with diagnostic cardiac cath and 1-2% or less with angioplasty/stenting. Further plans/recommendations pending cath results. Extent of CAD will impact treatment options, specifically will help dictate whether whether CABG/AVR or TAVR may be best treatment option for  him.  Current medicines are reviewed with the patient today.  The patient does not have concerns regarding medicines.  Labs/ tests ordered today include:   Orders Placed This Encounter  Procedures  . Basic Metabolic Panel (BMET)  . CBC  . INR/PT    Disposition:   FU pending cath results  Signed, [name], MD  01/12/2016 6:03 PM    [facility] [address], [place], [place]  [address] Phone: [phone]; Fax: [phone]

## 2016-01-17 NOTE — Progress Notes (Signed)
Site area: Right groin a 5 french arterial and a 7 french venous sheath was removed  Site Prior to Removal:  Level 0  Pressure Applied For 15 MINUTES    Minutes Beginning at 0840a  Manual:   Yes.    Patient Status During Pull:  stable  Post Pull Groin Site:  Level 0  Post Pull Instructions Given:  Yes.    Post Pull Pulses Present:  Yes.    Dressing Applied:  Yes.    Comments:  VS remain stable during sheath pull

## 2016-01-20 ENCOUNTER — Ambulatory Visit (INDEPENDENT_AMBULATORY_CARE_PROVIDER_SITE_OTHER): Payer: Medicare Other | Admitting: Cardiothoracic Surgery

## 2016-01-20 ENCOUNTER — Encounter: Payer: Self-pay | Admitting: Cardiothoracic Surgery

## 2016-01-20 VITALS — BP 120/74 | HR 86 | Resp 20 | Ht 72.0 in | Wt 145.0 lb

## 2016-01-20 DIAGNOSIS — I2510 Atherosclerotic heart disease of native coronary artery without angina pectoris: Secondary | ICD-10-CM | POA: Diagnosis not present

## 2016-01-20 DIAGNOSIS — I350 Nonrheumatic aortic (valve) stenosis: Secondary | ICD-10-CM | POA: Diagnosis not present

## 2016-01-20 DIAGNOSIS — I208 Other forms of angina pectoris: Secondary | ICD-10-CM | POA: Diagnosis not present

## 2016-01-20 NOTE — Progress Notes (Signed)
[address]       [place] [address]             [id number]                    Kourtney N Meisinger [facility] Medical Record [id number] Date of Birth: 08-05-1936  Referring: [name], MD Primary Care: [name], MD  Chief Complaint:  sob   History of Present Illness:    Corey Oneill 80 y.o. male is seen in the office for CAD and aortic stenosis.  In May 2016 he presented with exertional angina and moderate aortic stenosis by echo. He underwent cath demonstrating moderate CAD and moderate AS - medical therapy was recommended. He then went on to have recurrent angina.  Repeat cath, FFR, and PCI was done  in August 2016. His symptoms have never really improved with PCI. Repeat cath was done and showed no changes in anatomy.  He was seen as outpatient by Cardiology and referred to consider AVR. He noted of an ache in the center of his chest that occurs with stress and has occurred with brisk walking. He denies dyspnea, orthopnea, or PND. He complains of dizziness  But no  Syncope. He had a repeat overnight admission to the hospital February 9 for recurrent chest pain and was discharged home the following day, cardiac enzymes were negative.  Continues to have some rectal bleeding  And hematuria intermittent related to radiation proctitis. With frequent urination The day following his cardiology visit he presented   to [facility] ED 12/30 for evaluation of worsening abdominal pain. He under went appendectomy for Appendicitis perforated with pelvic abscess 11/16/2015.  He has slowly recovered form perforated appendicitis. Follow up  Cardiac cath done . Patient has had extionasl chest discomfort and dizzyness.      Wt Readings from Last 3 Encounters:  01/20/16 145 lb (65.772 kg)  01/17/16 140 lb (63.504 kg)  01/05/16 142 lb (64.411 kg)   Current Activity/ Functional Status:  Patient is independent with mobility/ambulation, transfers, ADL's,  IADL's.   Zubrod Score: At the time of surgery this patient's most appropriate activity status/level should be described as: []     0    Normal activity, no symptoms [x]     1    Restricted in physical strenuous activity but ambulatory, able to do out light work []     2    Ambulatory and capable of self care, unable to do work activities, up and about               >50 % of waking hours                              []     3    Only limited self care, in bed greater than 50% of waking hours []     4    Completely disabled, no self care, confined to bed or chair []     5    Moribund   Past Medical History  Diagnosis Date  . Hyperlipidemia   . Esophageal reflux   . Hx of radiation therapy 04/14/13-06/09/13    prostate 7800 cGy, 40 sessions, seminal vesicles 5600 cGy 40 sessions  . CAD (coronary artery disease)     a.  LHC 8/16: Mid to distal LAD 30%, OM1 40%, proximal mid RCA 40%, distal RCA 60% >> FFR  0.69  >> PCI: 3 x 15 mm Resolute DES  . Aortic stenosis     a. peak to peak gradient by LHC 8/16:  37 mmHg (moderately severe)   . Radiation proctitis   . Family history of adverse reaction to anesthesia     "daughter has PONV"  . Heart murmur   . Anginal pain ([place])   . H/O blood clots     "get them in my stool and urine" (11/12/2015)  . Depression   . Prostate cancer [facility]) dx'd 2014    Past Surgical History  Procedure Laterality Date  . Nasal fracture surgery      "broken years ago; several ORs to correct it"  . Tonsillectomy    . Prostate biopsy  2014    "needle biopsy"  . Colonoscopy    . Cardiac catheterization N/A 04/21/2015    Procedure: Right/Left Heart Cath and Coronary Angiography;  Surgeon: [name], MD;  Location: [place] [facility];  Service: Cardiovascular;  Laterality: N/A;  . Cardiac catheterization N/A 06/18/2015    Procedure: Intravascular Pressure Wire/FFR Study;  Surgeon: [name], MD;  Location: [place] [facility];  Service: Cardiovascular;  Laterality: N/A;   . Cardiac catheterization N/A 06/18/2015    Procedure: Coronary Stent Intervention;  Surgeon: [name], MD;  Location: [place] [facility];  Service: Cardiovascular;  Laterality: N/A;  . Cardiac catheterization N/A 06/18/2015    Procedure: Right/Left Heart Cath and Coronary Angiography;  Surgeon: [name], MD;  Location: [place] [facility];  Service: Cardiovascular;  Laterality: N/A;  . Cardiac catheterization N/A 06/23/2015    Procedure: Left Heart Cath and Cors/Grafts Angiography;  Surgeon: [name], MD;  Location: [place] [facility];  Service: Cardiovascular;  Laterality: N/A;  . Nasal septum surgery    . Inguinal hernia repair    . Fracture surgery    . Laparoscopic appendectomy N/A 11/16/2015    Procedure: APPENDECTOMY LAPAROSCOPIC;  Surgeon: [name], MD;  Location: [facility];  Service: General;  Laterality: N/A;  . Cardiac catheterization N/A 01/17/2016    Procedure: Right/Left Heart Cath and Coronary Angiography;  Surgeon: [name], MD;  Location: [place] [facility];  Service: Cardiovascular;  Laterality: N/A;    Family History  Problem Relation Age of Onset  . Brain cancer Father   . Depression Daughter   . Lymphoma Mother     Social History   Social History  . Marital Status: Married    Spouse Name: N/A  . Number of Children: 2  . Years of Education: N/A   Occupational History  . Not on file.   Social History Main Topics  . Smoking status: Former Smoker -- 0.00 packs/day    Types: Cigarettes  . Smokeless tobacco: Never Used     Comment: "quit smoking cigarettes in the 1960s; quit smoking cigars in the 1990s  . Alcohol Use: Yes  . Drug Use: No  . Sexual Activity: Not on file   Other Topics Concern  . Not on file   Social History Narrative    History  Smoking status  . Former Smoker -- 0.00 packs/day  . Types: Cigarettes  Smokeless tobacco  . Never Used    Comment: "quit smoking cigarettes in the 1960s; quit smoking cigars in the 1990s      History  Alcohol Use  . Yes     No Known Allergies  Current Outpatient Prescriptions  Medication Sig Dispense Refill  .  acetaminophen (TYLENOL) 500 MG tablet Take 500 mg by mouth every 8 (eight) hours as needed for mild pain. Reported on 11/26/2015    . albuterol (PROVENTIL HFA;VENTOLIN HFA) 108 (90 Base) MCG/ACT inhaler Inhale 1-2 puffs into the lungs at bedtime.    [name] aspirin EC 81 MG tablet Take 81 mg by mouth daily.    [name] buPROPion (WELLBUTRIN SR) 100 MG 12 hr tablet Take 100 mg by mouth daily.     . clopidogrel (PLAVIX) 75 MG tablet Take 1 tablet (75 mg total) by mouth daily. 90 tablet 3  . ferrous fumarate (HEMOCYTE - 106 MG FE) 325 (106 FE) MG TABS tablet Take 1 tablet (106 mg of iron total) by mouth daily. 30 each 0  . FLUoxetine (PROZAC) 10 MG capsule Take 1 capsule (10 mg total) by mouth daily. 90 capsule 3  . Magnesium 250 MG TABS Take 250 mg by mouth at bedtime.     . nitroGLYCERIN (NITROSTAT) 0.4 MG SL tablet Place 0.4 mg under the tongue every 5 (five) minutes as needed for chest pain. Reported on 11/26/2015    . rosuvastatin (CRESTOR) 10 MG tablet Take 10 mg by mouth at bedtime.     No current facility-administered medications for this visit.      Review of Systems:     Cardiac Review of Systems: Y or N  Chest Pain [ y   ]  Resting SOB Blue.Reese   ] Exertional SOB  Blue.Reese  ]  Orthopnea [ n ]   Pedal Edema [ n  ]    Palpitations [n  ] Syncope  [n  ]   Presyncope [ y  ]  General Review of Systems: [Y] = yes [  ]=no Constitional: recent weight change [25-30 lb wt loss  ];  Wt loss over the last 3 months [   ] anorexia [  ]; fatigue [  ]; nausea [  ]; night sweats [  ]; fever [  ]; or chills [  ];          Dental: poor dentition[  ]; Last Dentist visit:   Eye : blurred vision [  ]; diplopia [   ]; vision changes [  ];  Amaurosis fugax[  ]; Resp: cough [  ];  wheezing[ n ];  hemoptysis[n  ]; shortness of breath[y  ]; paroxysmal nocturnal dyspnea[  ]; dyspnea on exertion[  ]; or  orthopnea[  ];  GI:  gallstones[  ], vomiting[  ];  dysphagia[  ]; melena[  ];  hematochezia Blue.Reese  ]; heartburn[  ];   Hx of  Colonoscopy[y  ]; GU: kidney stones [  ]; hematuria[  ];   dysuria [  ];  nocturia[  ];  history of     obstruction [  ]; urinary frequency [  ]             Skin: rash, swelling[  ];, hair loss[  ];  peripheral edema[  ];  or itching[  ]; Musculosketetal: myalgias[  ];  joint swelling[  ];  joint erythema[  ];  joint pain[  ];  back pain[  ];  Heme/Lymph: bruising[  ];  bleeding[y  ];  anemia[  ];  Neuro: TIA[  ];  headaches[  ];  stroke[  ];  vertigo[  ];  seizures[  ];   paresthesias[  ];  difficulty walking[  ];  Psych:depression[  ]; anxiety[  ];  Endocrine: diabetes[  ];  thyroid  dysfunction[  ];  Immunizations: Flu up to date [  ]; Pneumococcal up to date [  ];  Other:  Physical Exam: BP 120/74 mmHg  Pulse 86  Resp 20  Ht 6' (1.829 m)  Wt 145 lb (65.772 kg)  BMI 19.66 kg/m2  SpO2 98%  PHYSICAL EXAMINATION: General appearance: alert, cooperative, appears older than stated age and cachectic Head: Normocephalic, without obvious abnormality, atraumatic Neck: no adenopathy, no carotid bruit, no JVD, supple, symmetrical, trachea midline and thyroid not enlarged, symmetric, no tenderness/mass/nodules,no obvious dental problemsI di Lymph nodes: Cervical, supraclavicular, and axillary nodes normal. Resp: clear to auscultation bilaterally Back: symmetric, no curvature. ROM normal. No CVA tenderness. Cardio: systolic murmur: holosystolic 3/6, crescendo at 2nd left intercostal space GI: soft, mild right lower quadrant tenderness, even when push on the left. non-tender; bowel sounds normal; no masses,  no organomegaly Extremities: extremities normal, atraumatic, no cyanosis or edema Neurologic: Grossly normal  Diagnostic Studies & Laboratory data:     Recent Radiology Findings:  Ct Abdomen Pelvis W Contrast  12/08/2015  CLINICAL DATA:  Right lower quadrant pain.  Post appendectomy 2 weeks ago. Patient has a history of prostate cancer treated with radiation therapy. EXAM: CT ABDOMEN AND PELVIS WITH CONTRAST TECHNIQUE: Multidetector CT imaging of the abdomen and pelvis was performed using the standard protocol following bolus administration of intravenous contrast. CONTRAST:  98 cc Isovue 300 COMPARISON:  CT of the abdomen pelvis 11/15/2015 FINDINGS: Lower chest: No acute findings. Coronary artery calcified atherosclerotic disease is seen. Hepatobiliary: There is a stable too small to characterized 8 mm right subcapsular hepatic lesion. Pancreas: No mass, inflammatory changes, or other significant abnormality. Spleen: Within normal limits in size and appearance. Adrenals/Urinary Tract: No evidence of hydronephrosis. There are bilateral hypoattenuated circumscribed renal masses. The 8 mm mass in the inferior pole of the left kidney and 15 mm mostly exophytic mass off of the inferior pole of the right kidney demonstrate intermediate density, and may represent hemorrhagic cysts. Stomach/Bowel: No evidence of obstruction. There are nonspecific thickening of proximal jejunal loops in the the left upper quadrant of the abdomen, image 26/ sequence 2. There has been a recent appendectomy, with extensive inflammatory mesenteric fat stranding and soft tissue thickening adjacent to the anterior and medial portion of the cecum, the terminal ileum and overlying perineum. There is an irregular hypoattenuated structure immediately adjacent to the appendectomy clips which measures 2.5 x 2.1 x 1.8 cm and may represent a small phlegmon collection. No evidence of free gas or free fluid within the abdomen. Vascular/Lymphatic: No pathologically enlarged lymph nodes. No evidence of abdominal aortic aneurysm. Calcified atherosclerotic disease of the aorta is seen. Reproductive: Surgical clips or radiation seeds are seen within the periphery of the prostate gland. Other: Diffuse subcutaneous fat  stranding. Musculoskeletal: There is a stable 7 mm sclerotic focus within L4 vertebral body. Advanced osteoarthritic changes of the lower lumbosacral spine and posterior facet arthropathy are also seen causing narrowing of the bilateral L5-S1 bony neural foramina. 2.5 cm area of coarsened trabeculation in the left iliac bone is stable. IMPRESSION: Status post recent appendectomy with extensive pericecal inflammatory changes with 2.5 cm probable phlegmon collection within the surgical site. No evidence of free fluid or free gas within the abdomen. Diffuse mild thickening of jejunal loops in the left upper outer quadrant of the abdomen, likely second into enteritis. Stable 8 mm right hepatic lesion. Bilateral renal masses, which may represent hemorrhagic cysts. Electronically Signed   By: Fidela Salisbury  M.D.   On: 12/08/2015 18:37     I have independently reviewed the above radiologic studies.  Recent Lab Findings: Lab Results  Component Value Date   WBC 5.3 01/05/2016   HGB 11.4* 01/05/2016   HCT 35.0* 01/05/2016   PLT 226 01/05/2016   GLUCOSE 72 01/05/2016   CHOL 155 06/18/2015   TRIG 99 06/18/2015   HDL 49 06/18/2015   LDLCALC 86 06/18/2015   ALT 16* 12/23/2015   AST 17 12/23/2015   NA 133* 01/05/2016   K 4.2 01/05/2016   CL 98 01/05/2016   CREATININE 0.91 01/05/2016   BUN 15 01/05/2016   CO2 28 01/05/2016   TSH 3.455 12/13/2015   INR 1.12 01/05/2016   ECHO: 11/01/2015 LV EF: 55% -  60%  ------------------------------------------------------------------- Indications:   (I35.0).  ------------------------------------------------------------------- History:  PMH: Acquired from the patient and from the patient&'s chart. Coronary artery disease. Aortic stenosis. Risk factors: Dyslipidemia.  ------------------------------------------------------------------- Study Conclusions  - Left ventricle: The cavity size was normal. There was mild concentric hypertrophy.  Systolic function was normal. The estimated ejection fraction was in the range of 55% to 60%. Wall motion was normal; there were no regional wall motion abnormalities. Doppler parameters are consistent with abnormal left ventricular relaxation (grade 1 diastolic dysfunction). - Aortic valve: Valve mobility was restricted. There was severe stenosis. Valve area (Vmax): 0.82 cm^2. - Mitral valve: Moderately calcified annulus. - Right ventricle: The cavity size was normal. Wall thickness was normal. Systolic function was normal. - Tricuspid valve: There was trivial regurgitation. - Inferior vena cava: The vessel was normal in size. The respirophasic diameter changes were in the normal range (>= 50%), consistent with normal central venous pressure. Aortic valve:  Trileaflet; moderately thickened, severely calcified leaflets. Valve mobility was restricted. Doppler: There was severe stenosis.  There was no regurgitation.  VTI ratio of LVOT to aortic valve: 0.22. Valve area (VTI): 0.85 cm^2. Indexed valve area (VTI): 0.46 cm^2/m^2. Peak velocity ratio of LVOT to aortic valve: 0.21. Valve area (Vmax): 0.82 cm^2. Indexed valve area (Vmax): 0.44 cm^2/m^2. Mean velocity ratio of LVOT to aortic valve: 0.2. Valve area (Vmean): 0.77 cm^2. Indexed valve area (Vmean): 0.42 cm^2/m^2.  Mean gradient (S): 34 mm Hg. Peak gradient (S): 52 mm Hg. Impressions:  - Although peak velocity and mean gradient calculate as moderate, there appears to be severe stenosis of the aortic valve. The calculated valve area indicates severe stenosis. It is likely that the peak gradient was not identified in this study. Study is limited by poor sound transmission.   Cardiac Cath:3/6 Procedures    Right/Left Heart Cath and Coronary Angiography    Conclusion    1. Patent LAD with diffuse calcification and mild nonobstructive disease 2. Moderate stenosis in the intermediate branch, stable  from previous study 3. Mild RCA stenosis with patency of the previously implanted stent 4. Heavily calcified aortic valve with severe aortic stenosis, mean gradient 36 mmHg, calculated aortic valve area 1.07 cm  Continued evaluation for TAVR versus conventional AVR    Technique and Indications        Coronary Findings    Dominance: Right   Left Anterior Descending   . Prox LAD lesion, 30% stenosed. The LAD is diffusely calcified. The vessel has mild proximal stenosis without any high-grade obstruction. The diagonal branches are patent without significant disease. The vessel reaches the LV apex.     Ramus Intermedius   . Ramus lesion, 60% stenosed. There is a medium caliber intermediate branch  with diffuse 60% stenosis. This vessel has previously undergone pressure wire analysis and it was negative.     Left Circumflex  After the intermediate/high marginal branch, the AV circumflex is relatively small and it supplies 2 small obtuse marginal branches without significant stenosis.     Right Coronary Artery   . Prox RCA to Mid RCA lesion, 40% stenosed. The RCA is calcified. There is a long 40% stenosis in the proximal vessel, unchanged from the previous study.   . Dist RCA lesion, 0% stenosed. Previously placed Dist RCA stent (unknown type) is patent. The previously implanted stent is widely patent without significant in-stent restenosis.       Right Heart Pressures Hemodynamic findings consistent with aortic stenosis. LV EDP is normal. Aortic valve hemodynamics: Peak to peak gradient 42 mmHg, mean gradient 36 mmHg, aortic valve area 1.07 cm             STS Calculation :  risk of mortality 1.58%  Morbidity or mortality  12.5%  Long length of stay 5%  Permanent stroke 1.6%  Prolonged ventilation 6.6%  Renal failure 2.8%  Reoperation 7.5  Short length of stay 45%   Assessment / Plan:   1/ Moderate to severe symptomatic AS and CAD with stent of PDA  Valve area  (Vmax): 0.82 cm^2.   Aortic valve peak velocity, S       361  cm/s   --------- Aortic valve mean velocity, S       274  cm/s   STS criteria his risk of mortality with aortic valve replacement coronary artery bypass grafting is approximately 2% morbidity and mortality 12% however this does not take into account the patient's overall physical condition currently in his ongoing medical problems with radiation proctitis and cystitis.  After this we will further evaluate for  TVAR.  There is no doubt with the patient's history a tissue valve is indicated without use of Coumadin  Standard vs TVAR valve has been discussed in detail with patient. Although not "high Risk" surgical patient his multiple medical issues and age make TVAR appealing. Patient is agreeable with ct scan  evaluation for consideration. Will arrange Cardiac CT and being seen by Dr Cyndia Bent and Burt Knack.    Grace Isaac MD      [address] [place] [address] Office [phone]   Beeper [phone]  01/20/2016 3:48 PM

## 2016-01-21 ENCOUNTER — Other Ambulatory Visit: Payer: Self-pay | Admitting: *Deleted

## 2016-01-21 DIAGNOSIS — I350 Nonrheumatic aortic (valve) stenosis: Secondary | ICD-10-CM

## 2016-01-25 ENCOUNTER — Telehealth: Payer: Self-pay | Admitting: Family Medicine

## 2016-01-25 ENCOUNTER — Ambulatory Visit (HOSPITAL_COMMUNITY)
Admission: RE | Admit: 2016-01-25 | Discharge: 2016-01-25 | Disposition: A | Discharge status: Home / Self Care | Payer: Medicare Other | Source: Ambulatory Visit | Attending: Cardiovascular Disease | Admitting: Cardiovascular Disease

## 2016-01-25 ENCOUNTER — Ambulatory Visit: Payer: Medicare Other | Attending: Cardiovascular Disease | Admitting: Physical Therapy

## 2016-01-25 ENCOUNTER — Encounter (HOSPITAL_COMMUNITY): Payer: Self-pay

## 2016-01-25 ENCOUNTER — Encounter: Payer: Self-pay | Admitting: Physical Therapy

## 2016-01-25 DIAGNOSIS — R942 Abnormal results of pulmonary function studies: Secondary | ICD-10-CM | POA: Diagnosis not present

## 2016-01-25 DIAGNOSIS — R269 Unspecified abnormalities of gait and mobility: Secondary | ICD-10-CM

## 2016-01-25 DIAGNOSIS — R918 Other nonspecific abnormal finding of lung field: Secondary | ICD-10-CM | POA: Insufficient documentation

## 2016-01-25 DIAGNOSIS — R293 Abnormal posture: Secondary | ICD-10-CM | POA: Diagnosis not present

## 2016-01-25 DIAGNOSIS — I358 Other nonrheumatic aortic valve disorders: Secondary | ICD-10-CM | POA: Insufficient documentation

## 2016-01-25 DIAGNOSIS — R590 Localized enlarged lymph nodes: Secondary | ICD-10-CM | POA: Diagnosis not present

## 2016-01-25 DIAGNOSIS — R911 Solitary pulmonary nodule: Secondary | ICD-10-CM | POA: Diagnosis not present

## 2016-01-25 DIAGNOSIS — I311 Chronic constrictive pericarditis: Secondary | ICD-10-CM | POA: Insufficient documentation

## 2016-01-25 DIAGNOSIS — J449 Chronic obstructive pulmonary disease, unspecified: Secondary | ICD-10-CM | POA: Insufficient documentation

## 2016-01-25 DIAGNOSIS — Z952 Presence of prosthetic heart valve: Secondary | ICD-10-CM | POA: Diagnosis not present

## 2016-01-25 DIAGNOSIS — I350 Nonrheumatic aortic (valve) stenosis: Secondary | ICD-10-CM

## 2016-01-25 LAB — PULMONARY FUNCTION TEST
DL/VA % PRED: 68 %
DL/VA: 3.1 ml/min/mmHg/L
DLCO unc % pred: 72 %
DLCO unc: 22.51 ml/min/mmHg
FEF 25-75 POST: 1.67 L/s
FEF 25-75 Pre: 1.85 L/sec
FEF2575-%CHANGE-POST: -9 %
FEF2575-%PRED-PRE: 93 %
FEF2575-%Pred-Post: 84 %
FEV1-%Change-Post: -18 %
FEV1-%Pred-Post: 93 %
FEV1-%Pred-Pre: 114 %
FEV1-PRE: 3.24 L
FEV1-Post: 2.64 L
FEV1FVC-%CHANGE-POST: -18 %
FEV1FVC-%PRED-PRE: 93 %
FEV6-%CHANGE-POST: -1 %
FEV6-%PRED-PRE: 127 %
FEV6-%Pred-Post: 125 %
FEV6-Post: 4.63 L
FEV6-Pre: 4.7 L
FEV6FVC-%Pred-Post: 107 %
FEV6FVC-%Pred-Pre: 107 %
FVC-%CHANGE-POST: 0 %
FVC-%Pred-Post: 121 %
FVC-%Pred-Pre: 121 %
FVC-Post: 4.8 L
FVC-Pre: 4.8 L
POST FEV1/FVC RATIO: 55 %
PRE FEV1/FVC RATIO: 67 %
Post FEV6/FVC ratio: 100 %
Pre FEV6/FVC Ratio: 100 %
RV % pred: 166 %
RV: 4.26 L
TLC % PRED: 131 %
TLC: 9.01 L

## 2016-01-25 MED ORDER — ALBUTEROL SULFATE (2.5 MG/3ML) 0.083% IN NEBU
2.5000 mg | INHALATION_SOLUTION | Freq: Once | RESPIRATORY_TRACT | Status: AC
  Administered 2016-01-25: 2.5 mg via RESPIRATORY_TRACT

## 2016-01-25 MED ORDER — IOHEXOL 350 MG/ML SOLN
75.0000 mL | Freq: Once | INTRAVENOUS | Status: AC | PRN
  Administered 2016-01-25: 75 mL via INTRAVENOUS

## 2016-01-25 MED ORDER — METOPROLOL TARTRATE 1 MG/ML IV SOLN
5.0000 mg | INTRAVENOUS | Status: DC | PRN
  Administered 2016-01-25: 5 mg via INTRAVENOUS
  Filled 2016-01-25: qty 5

## 2016-01-25 MED ORDER — METOPROLOL TARTRATE 1 MG/ML IV SOLN
INTRAVENOUS | Status: AC
  Administered 2016-01-25: 5 mg via INTRAVENOUS
  Filled 2016-01-25: qty 5

## 2016-01-25 NOTE — Telephone Encounter (Signed)
Pt stopped by with update. He states he has now finished all his procedures that needed to be done prior to heart surgery. He states that he will meet with Dr. Cyndia Bent next Tuesday to determine when surgery will be scheduled.

## 2016-01-25 NOTE — Therapy (Signed)
[place], [place], [address] Phone: [phone]   Fax:  [phone]  Physical Therapy Evaluation  Patient Details  Name: Corey Oneill MRN: [id number] Date of Birth: 1936/05/09 Referring Provider: Dr. [name]  Encounter Date: 01/25/2016      PT End of Session - 01/25/16 1308    Visit Number 1   PT Start Time [id number]   PT Stop Time 1350   PT Time Calculation (min) 44 min   Activity Tolerance Patient tolerated treatment well      Past Medical History  Diagnosis Date  . Hyperlipidemia   . Esophageal reflux   . Hx of radiation therapy 04/14/13-06/09/13    prostate 7800 cGy, 40 sessions, seminal vesicles 5600 cGy 40 sessions  . CAD (coronary artery disease)     a.  LHC 8/16: Mid to distal LAD 30%, OM1 40%, proximal mid RCA 40%, distal RCA 60% >> FFR 0.69  >> PCI: 3 x 15 mm Resolute DES  . Aortic stenosis     a. peak to peak gradient by LHC 8/16:  37 mmHg (moderately severe)   . Radiation proctitis   . Family history of adverse reaction to anesthesia     "daughter has PONV"  . Heart murmur   . Anginal pain ([place])   . H/O blood clots     "get them in my stool and urine" (11/12/2015)  . Depression   . Prostate cancer [facility]) dx'd 2014    Past Surgical History  Procedure Laterality Date  . Nasal fracture surgery      "broken years ago; several ORs to correct it"  . Tonsillectomy    . Prostate biopsy  2014    "needle biopsy"  . Colonoscopy    . Cardiac catheterization N/A 04/21/2015    Procedure: Right/Left Heart Cath and Coronary Angiography;  Surgeon: [name], MD;  Location: [facility];  Service: Cardiovascular;  Laterality: N/A;  . Cardiac catheterization N/A 06/18/2015    Procedure: Intravascular Pressure Wire/FFR Study;  Surgeon: [name], MD;  Location: [facility];  Service: Cardiovascular;  Laterality: N/A;  . Cardiac catheterization N/A 06/18/2015    Procedure:  Coronary Stent Intervention;  Surgeon: [name], MD;  Location: [facility];  Service: Cardiovascular;  Laterality: N/A;  . Cardiac catheterization N/A 06/18/2015    Procedure: Right/Left Heart Cath and Coronary Angiography;  Surgeon: [name], MD;  Location: [facility];  Service: Cardiovascular;  Laterality: N/A;  . Cardiac catheterization N/A 06/23/2015    Procedure: Left Heart Cath and Cors/Grafts Angiography;  Surgeon: [name], MD;  Location: [facility];  Service: Cardiovascular;  Laterality: N/A;  . Nasal septum surgery    . Inguinal hernia repair    . Fracture surgery    . Laparoscopic appendectomy N/A 11/16/2015    Procedure: APPENDECTOMY LAPAROSCOPIC;  Surgeon: [name], MD;  Location: [facility];  Service: General;  Laterality: N/A;  . Cardiac catheterization N/A 01/17/2016    Procedure: Right/Left Heart Cath and Coronary Angiography;  Surgeon: [name], MD;  Location: [facility];  Service: Cardiovascular;  Laterality: N/A;    There were no vitals filed for this visit.  Visit Diagnosis:  Abnormality of gait - Plan: PT plan of care cert/re-cert  Abnormal posture - Plan: PT plan of care cert/re-cert  Severe aortic stenosis - Plan: PT plan of care cert/re-cert  Subjective Assessment - 01/25/16 1308    Subjective exertional SOB May 2016 and chest discomfort noticed at first when walking the dog, later went to [facility] and had stent placement in Nov 2016. Pt reports he has used his Nitro pill 21 times since early February.   Patient Stated Goals get more energy to return to doing more around the house and traveling for sales job, unable to travel since first of the year   Currently in Pain? No/denies            [facility] PT Assessment - 01/25/16 0001    Assessment   Medical Diagnosis severe aortic stenosis   Referring Provider Dr. [name]   Onset Date/Surgical Date 12/15/15   Precautions   Precaution Comments No strenuous  activity   Restrictions   Weight Bearing Restrictions No   Balance Screen   Has the patient fallen in the past 6 months Yes   How many times? 1  tripped   Has the patient had a decrease in activity level because of a fear of falling?  No   Is the patient reluctant to leave their home because of a fear of falling?  No   Home Environment   Living Environment Private residence   Living Arrangements Spouse/significant other   Home Access Level entry   [place] One level   Prior Function   Level of Independence Independent  unable to travel for work right now   Posture/Postural Control   Posture/Postural Control Postural limitations   Postural Limitations Rounded Shoulders;Forward head   ROM / Strength   AROM / PROM / Strength AROM;Strength   AROM   Overall AROM Comments Grossly WNL   Strength   Overall Strength Comments Grossly 4-5/5 throughout   Strength Assessment Site Hand   Right/Left hand Right;Left   Right Hand Grip (lbs) 60  R hand dominant   Left Hand Grip (lbs) 53   Ambulation/Gait   Gait Comments Pt ambulates with mild sway especially immediately upon rising but no LOB observed. No significant gait deviations noted. Pt able to maintain strong and steady pace throughout 6 minute walk test.           [facility] Pre-Surgical Assessment - 01/25/16 0001    5 Meter Walk Test- trial 1 4.5 sec   5 Meter Walk Test- trial 2 4 sec.    5 Meter Walk Test- trial 3 3.4 sec.  </= 6 sec WNL   5 meter walk test average 3.97 sec   Timed Up & Go Test trial  9.2 sec.   Comments </= 12 sec WNL   4 Stage Balance Test tolerated for:  2 sec.   4 Stage Balance Test Position 4   comment not indicative of high fall risk   Sit To Stand Test- trial 1 12.8 sec.   Comment </= 12.6 WNL age/gender   ADL/IADL Independent with: Bathing;Meal prep;Dressing;Finances;Yard work  doesn't have to [employer] but feels he could   ADL/IADL Fraility Index Vulnerable  medically   6 Minute Walk- Baseline  yes   BP (mmHg) 118/58 mmHg   HR (bpm) 66  reports had medication to lower HR at 11 am today pulm. test   02 Sat (%RA) 94 %   Modified Borg Scale for Dyspnea 0- Nothing at all   Perceived Rate of Exertion (Borg) 6-   6 Minute Walk Post Test yes   BP (mmHg) 140/62 mmHg   HR (bpm) 90   02 Sat (%  RA) 92 %   Modified Borg Scale for Dyspnea 1- Very mild shortness of breath   Perceived Rate of Exertion (Borg) 7- Very, very light   Aerobic Endurance Distance Walked 1340   Endurance additional comments Pt did not require any seated rest breaks and tolerated testing well.                                      Plan - February 21, 2016 1414    Clinical Impression Statement Pt is a 80 yo male presenting to OP PT for evaluation prior to possible TAVR surgery due to severe aortic stenosis. Pt reports onset of exertional SOB and chest discomfort in May 2016, had a stent placed in Novemeber 2016 and sx continue although have been well managed with frequent use of Nitro begining in Feb 2017. Pt is able to perform normal activities although he cannot move as fast as he used to. He is limited from traveling for his job right now due to exertional SOB and overall fatigue. Pt presents with good ROM/strength, good balance, normal gait speed and good aerobic endurance as far as 6 minute walk test. Pt does present with a mild imbalance although no LOB observed during evaluation and he is not considered a high fall risk per any of the testing.    PT Frequency One time visit   Consulted and Agree with Plan of Care Patient          G-Codes - 21-Feb-2016 1419    Functional Assessment Tool Used 6 minute walk 1340 feet   Functional Limitation Mobility: Walking and moving around   Mobility: Walking and Moving Around Current Status [phone]) At least 1 percent but less than 20 percent impaired, limited or restricted   Mobility: Walking and Moving Around Goal Status [phone]) At least 1 percent but less  than 20 percent impaired, limited or restricted   Mobility: Walking and Moving Around Discharge Status [phone]) At least 1 percent but less than 20 percent impaired, limited or restricted       Problem List Patient Active Problem List   Diagnosis Date Noted  . Chest pain 12/23/2015  . S/P appendectomy 11/12/2015  . CAD (coronary artery disease), native coronary artery   . Esophageal reflux 03/22/2015  . Aortic stenosis   . Depression 11/20/2014  . Prostate cancer ([place]) 03/05/2013  . Erectile dysfunction   . Hyperlipidemia 06/07/2007    Romualdo Bolk, PT 02-21-16, 2:20 PM  [facility] [address] [place], [place], [address] Phone: [phone]   Fax:  [phone]  Name: Corey Oneill MRN: [id number] Date of Birth: Mar 22, 1936

## 2016-01-26 ENCOUNTER — Telehealth: Payer: Self-pay | Admitting: Cardiovascular Disease

## 2016-01-26 NOTE — Telephone Encounter (Signed)
New Message:  Pt is going to have an Aorta Valve Replacement. He wants to know if it will be all right for him to fly?

## 2016-01-26 NOTE — Telephone Encounter (Signed)
I think he's at low risk of air travel.

## 2016-01-26 NOTE — Telephone Encounter (Signed)
I contacted Corey Spiller RN at [facility] and she spoke with Corey Oneill in regards to whether or not this pt can fly. Per Corey Oneill the pt does not have restrictions in regards to flying due to aortic stenosis but Corey Oneill did feel like the pt could have issues in regards to CHF related to flying and the pt should be cautious.    I spoke with the pt and he received a phone call that his sister in [place] is failing in a nursing home. The pt is trying to determine if he is okay to fly.  If the pt cannot fly then he will otherwise plan to travel to [place] by car and this is a 10 hour trip.  I made the pt aware of Corey Oneill comments.  The pt would also like Corey Oneill opinion in regards to traveling.  I made the pt aware that Corey Oneill will be back in the office on Friday morning and I will contact him after Corey Oneill reviews this message.  The pt said this was fine as he is not planning on traveling in the next few days.

## 2016-01-28 NOTE — Telephone Encounter (Signed)
Left message on machine for pt to contact the office.   

## 2016-01-28 NOTE — Telephone Encounter (Signed)
I spoke with the pt and made him aware that he is at low risk with flying.  The pt said his wife is afraid of flying and they are planning to drive. I made him aware that he should make frequent stops to get out and walk while traveling. The pt did receive a call this morning that his sister had taken a turn for the worst and he may have to travel in the next few days. The pt has an appointment with [facility] on Tuesday and he may have to reschedule this appointment. I will make Levonne Spiller RN aware of the pt's situation.

## 2016-02-02 ENCOUNTER — Other Ambulatory Visit: Payer: Self-pay | Admitting: *Deleted

## 2016-02-02 ENCOUNTER — Institutional Professional Consult (permissible substitution) (INDEPENDENT_AMBULATORY_CARE_PROVIDER_SITE_OTHER): Payer: Medicare Other | Admitting: Surgery

## 2016-02-02 ENCOUNTER — Encounter: Payer: Self-pay | Admitting: Surgery

## 2016-02-02 ENCOUNTER — Encounter: Payer: Medicare Other | Admitting: Surgery

## 2016-02-02 VITALS — BP 130/85 | HR 78 | Resp 20 | Ht 72.0 in | Wt 145.0 lb

## 2016-02-02 DIAGNOSIS — I350 Nonrheumatic aortic (valve) stenosis: Secondary | ICD-10-CM | POA: Diagnosis not present

## 2016-02-02 DIAGNOSIS — I2510 Atherosclerotic heart disease of native coronary artery without angina pectoris: Secondary | ICD-10-CM | POA: Diagnosis not present

## 2016-02-07 ENCOUNTER — Encounter: Payer: Self-pay | Admitting: Surgery

## 2016-02-07 NOTE — Progress Notes (Signed)
Patient ID: Corey Oneill, male   DOB: September 02, 1936, 80 y.o.   MRN: [id number]   [facility] CONSULTATION REPORT  Referring Provider is [name], MD PCP is [name], MD  Chief Complaint  Patient presents with  . Aortic Stenosis    2nd TAVR eval review all studies and schedule surgery date    HPI:  The patient is a 80 year old gentleman with a history of coronary artery disease and aortic stenosis who presented in 03/2015 with exertional angina and moderate AS by echo. He underwent cath on 04/21/2015 showing moderate coronary disease and moderate AS and medical therapy was recommended. He continued having angina and repeat cath in August 2016 showed a 60% distal RCA stenosis with an FFR of 0.69. This was treated with a DES. There was still moderate AS with a mean gradient of 35.6 mm Hg and a peak of 41. AVA was 1.21 cm2. He says that his symptoms did not improve with PCI. He ha a repeat cath four days later that showed a patent stent and no new source for angina. He continued to have some chest discomfort with exertion that he describes as an aching in his chest. He denies shortness of breath and orthopnea. He has had some dizziness. A repeat echo was done on 11/01/2015 showing a severely calcified aortic valve with a mean gradient of 34 mm Hg and a DI of 0.2. His EF was 55-60%. He was admitted in January 2017 with a perforated appendicitis and has had a slow recovery. He was subsequently admitted in early February with chest pain and had negative enzymes. He was seen by Dr. Burt Knack for consideration of TAVR and a repeat cath on 01/17/2016 showed a patent RCA stent and a heavily calcified AV with a mean gradient of 36 mm Hg.  Past Medical History  Diagnosis Date  . Hyperlipidemia   . Esophageal reflux   . Hx of radiation therapy 04/14/13-06/09/13    prostate 7800 cGy, 40 sessions, seminal vesicles 5600 cGy  40 sessions  . CAD (coronary artery disease)     a.  LHC 8/16: Mid to distal LAD 30%, OM1 40%, proximal mid RCA 40%, distal RCA 60% >> FFR 0.69  >> PCI: 3 x 15 mm Resolute DES  . Aortic stenosis     a. peak to peak gradient by LHC 8/16:  37 mmHg (moderately severe)   . Radiation proctitis   . Family history of adverse reaction to anesthesia     "daughter has PONV"  . Heart murmur   . Anginal pain ([place])   . H/O blood clots     "get them in my stool and urine" (11/12/2015)  . Depression   . Prostate cancer [facility]) dx'd 2014    Past Surgical History  Procedure Laterality Date  . Nasal fracture surgery      "broken years ago; several ORs to correct it"  . Tonsillectomy    . Prostate biopsy  2014    "needle biopsy"  . Colonoscopy    . Cardiac catheterization N/A 04/21/2015    Procedure: Right/Left Heart Cath and Coronary Angiography;  Surgeon: [name], MD;  Location: [place] [facility];  Service: Cardiovascular;  Laterality: N/A;  . Cardiac catheterization N/A 06/18/2015    Procedure: Intravascular Pressure Wire/FFR Study;  Surgeon: [name], MD;  Location: [place] [facility];  Service: Cardiovascular;  Laterality: N/A;  .  Cardiac catheterization N/A 06/18/2015    Procedure: Coronary Stent Intervention;  Surgeon: [name], MD;  Location: [place] [facility];  Service: Cardiovascular;  Laterality: N/A;  . Cardiac catheterization N/A 06/18/2015    Procedure: Right/Left Heart Cath and Coronary Angiography;  Surgeon: [name], MD;  Location: [place] [facility];  Service: Cardiovascular;  Laterality: N/A;  . Cardiac catheterization N/A 06/23/2015    Procedure: Left Heart Cath and Cors/Grafts Angiography;  Surgeon: [name], MD;  Location: [place] [facility];  Service: Cardiovascular;  Laterality: N/A;  . Nasal septum surgery    . Inguinal hernia repair    . Fracture surgery    . Laparoscopic appendectomy N/A 11/16/2015    Procedure: APPENDECTOMY LAPAROSCOPIC;  Surgeon: [name], MD;  Location: [place];  Service: General;  Laterality: N/A;  . Cardiac catheterization N/A 01/17/2016    Procedure: Right/Left Heart Cath and Coronary Angiography;  Surgeon: [name], MD;  Location: [place] [facility];  Service: Cardiovascular;  Laterality: N/A;    Family History  Problem Relation Age of Onset  . Brain cancer Father   . Depression Daughter   . Lymphoma Mother     Social History   Social History  . Marital Status: Married    Spouse Name: N/A  . Number of Children: 2  . Years of Education: N/A   Occupational History  . Not on file.   Social History Main Topics  . Smoking status: Former Smoker -- 0.00 packs/day    Types: Cigarettes  . Smokeless tobacco: Never Used     Comment: "quit smoking cigarettes in the 1960s; quit smoking cigars in the 1990s  . Alcohol Use: Yes  . Drug Use: No  . Sexual Activity: Not on file   Other Topics Concern  . Not on file   Social History Narrative    Current Outpatient Prescriptions  Medication Sig Dispense Refill  . acetaminophen (TYLENOL) 500 MG tablet Take 500 mg by mouth every 8 (eight) hours as needed for mild pain. Reported on 11/26/2015    . albuterol (PROVENTIL HFA;VENTOLIN HFA) 108 (90 Base) MCG/ACT inhaler Inhale 1-2 puffs into the lungs at bedtime. Reported on 01/25/2016    . aspirin EC 81 MG tablet Take 81 mg by mouth daily.    [name] buPROPion (WELLBUTRIN SR) 100 MG 12 hr tablet Take 100 mg by mouth daily.     . clopidogrel (PLAVIX) 75 MG tablet Take 1 tablet (75 mg total) by mouth daily. 90 tablet 3  . co-enzyme Q-10 50 MG capsule Take 50 mg by mouth daily.    . ferrous fumarate (HEMOCYTE - 106 MG FE) 325 (106 FE) MG TABS tablet Take 1 tablet (106 mg of iron total) by mouth daily. 30 each 0  . FLUoxetine (PROZAC) 10 MG capsule Take 1 capsule (10 mg total) by mouth daily. 90 capsule 3  . Magnesium 250 MG TABS Take 250 mg by mouth at bedtime.     . nitroGLYCERIN (NITROSTAT) 0.4 MG SL tablet Place 0.4 mg  under the tongue every 5 (five) minutes as needed for chest pain. Reported on 11/26/2015    . rosuvastatin (CRESTOR) 10 MG tablet Take 10 mg by mouth at bedtime.     No current facility-administered medications for this visit.    No Known Allergies    Review of Systems:   General:  decreased appetite, decreased energy, no weight gain, some weight loss, no fever  Cardiac:  has  chest pain with exertion, no chest pain at rest, no SOB with  exertion, no resting SOB, no PND, no orthopnea, no palpitations, no arrhythmia, no atrial fibrillation, no LE edema, has dizzy spells, no syncope  Respiratory:  no shortness of breath, no home oxygen, no productive cough, no dry cough, no bronchitis, no wheezing, no hemoptysis, no asthma, no pain with inspiration or cough, no sleep apnea, no CPAP at night  GI:   no difficulty swallowing, no reflux, no frequent heartburn, no hiatal hernia, no abdominal pain, no constipation, no diarrhea, has hematochezia related to radiation proctitis, no hematemesis, no melena  GU:   no dysuria,  no frequency, no urinary tract infection, has hematuria, no enlarged prostate, no kidney stones, no kidney disease  Vascular:  no pain suggestive of claudication, no pain in feet, no leg cramps, no varicose veins, no DVT, no non-healing foot ulcer  Neuro:   no stroke, no TIA's, no seizures, no headaches, notemporary blindness one eye,  no slurred speech, no peripheral neuropathy, no chronic pain, no instability of gait, no memory/cognitive dysfunction  Musculoskeletal: no arthritis, no joint swelling, no myalgias, no difficulty walking, normal mobility   Skin:   no rash, no itching, no skin infections, no pressure sores or ulcerations  Psych:   no anxiety, treated for depression, no nervousness, no unusual recent stress  Eyes:   no blurry vision, no floaters, no recent vision changes,  wears glasses or contacts  ENT:   no hearing loss, no loose or painful teeth, no dentures, last saw  dentist this year  Hematologic:  no easy bruising, no abnormal bleeding, no clotting disorder, no frequent epistaxis  Endocrine:  no diabetes, does not check CBG's at home           Physical Exam:   BP 130/85 mmHg  Pulse 78  Resp 20  Ht 6' (1.829 m)  Wt 145 lb (65.772 kg)  BMI 19.66 kg/m2  SpO2 95%  General:  Elderly but  well-appearing  HEENT:  Unremarkable , NCAT, PERLA, EOMI, oropharynx clear  Neck:   no JVD, no bruits, no adenopathy or thyromegaly  Chest:   clear to auscultation, symmetrical breath sounds, no wheezes, no rhonchi   CV:   RRR, grade III/VI crescendo/decrescendo murmur heard best at RSB,  no diastolic murmur  Abdomen:  soft, non-tender, no masses or organomegaly  Extremities:  warm, well-perfused, pulses palpable, no LE edema  Rectal/GU  Deferred  Neuro:   Grossly non-focal and symmetrical throughout  Skin:   Clean and dry, no rashes, no breakdown   Diagnostic Tests:   [facility]  [address], [place] [address]  [phone]  ------------------------------------------------------------------- Transthoracic Echocardiography  Patient: Drayden, Cleckler MR #: [id number] Study Date: 11/01/2015 Gender: M Age: 53 Height: 182.9 cm Weight: 67.6 kg BSA: 1.84 m^2 Pt. Status: Room:  SONOGRAPHER [place], Will ATTENDING Despina Pole, Marlane Hatcher REFERRING Truitt Merle C PERFORMING [facility], Outpatient  cc:  ------------------------------------------------------------------- LV EF: 55% - 60%  ------------------------------------------------------------------- Indications: (I35.0).  ------------------------------------------------------------------- History: PMH: Acquired from the patient and from the patient&'s chart. Coronary artery disease. Aortic stenosis. Risk  factors: Dyslipidemia.  ------------------------------------------------------------------- Study Conclusions  - Left ventricle: The cavity size was normal. There was mild  concentric hypertrophy. Systolic function was normal. The  estimated ejection fraction was in the range of 55% to 60%. Wall  motion was normal; there were no regional wall motion  abnormalities. Doppler parameters are consistent  with abnormal  left ventricular relaxation (grade 1 diastolic dysfunction). - Aortic valve: Valve mobility was restricted. There was severe  stenosis. Valve area (Vmax): 0.82 cm^2. - Mitral valve: Moderately calcified annulus. - Right ventricle: The cavity size was normal. Wall thickness was  normal. Systolic function was normal. - Tricuspid valve: There was trivial regurgitation. - Inferior vena cava: The vessel was normal in size. The  respirophasic diameter changes were in the normal range (>= 50%),  consistent with normal central venous pressure.  Impressions:  - Although peak velocity and mean gradient calculate as moderate,  there appears to be severe stenosis of the aortic valve. The  calculated valve area indicates severe stenosis. It is likely  that the peak gradient was not identified in this study. Study is  limited by poor sound transmission.  Transthoracic echocardiography. M-mode, complete 2D, spectral Doppler, and color Doppler. Birthdate: Patient birthdate: 05-Jun-1936. Age: Patient is 80 yr old. Sex: Gender: male. BMI: 20.2 kg/m^2. Blood pressure: 110/64 Patient status: Outpatient. Study date: Study date: 11/01/2015. Study time: 07:56 AM. Location: [facility]  -------------------------------------------------------------------  ------------------------------------------------------------------- Left ventricle: The cavity size was normal. There was mild concentric hypertrophy. Systolic function was normal. The  estimated ejection fraction was in the range of 55% to 60%. Wall motion was normal; there were no regional wall motion abnormalities. Doppler parameters are consistent with abnormal left ventricular relaxation (grade 1 diastolic dysfunction).  ------------------------------------------------------------------- Aortic valve: Trileaflet; moderately thickened, severely calcified leaflets. Valve mobility was restricted. Doppler: There was severe stenosis. There was no regurgitation. VTI ratio of LVOT to aortic valve: 0.22. Valve area (VTI): 0.85 cm^2. Indexed valve area (VTI): 0.46 cm^2/m^2. Peak velocity ratio of LVOT to aortic valve: 0.21. Valve area (Vmax): 0.82 cm^2. Indexed valve area (Vmax): 0.44 cm^2/m^2. Mean velocity ratio of LVOT to aortic valve: 0.2. Valve area (Vmean): 0.77 cm^2. Indexed valve area (Vmean): 0.42 cm^2/m^2. Mean gradient (S): 34 mm Hg. Peak gradient (S): 52 mm Hg.  ------------------------------------------------------------------- Aorta: Aortic root: The aortic root was normal in size.  ------------------------------------------------------------------- Mitral valve: Moderately calcified annulus. Mobility was not restricted. Doppler: Transvalvular velocity was within the normal range. There was no evidence for stenosis. There was no regurgitation.  ------------------------------------------------------------------- Left atrium: The atrium was normal in size.  ------------------------------------------------------------------- Right ventricle: The cavity size was normal. Wall thickness was normal. Systolic function was normal.  ------------------------------------------------------------------- Pulmonic valve: Poorly visualized. Doppler: Transvalvular velocity was within the normal range. There was no evidence for stenosis.  ------------------------------------------------------------------- Tricuspid valve: Structurally  normal valve. Doppler: Transvalvular velocity was within the normal range. There was trivial regurgitation.  ------------------------------------------------------------------- Pulmonary artery: The main pulmonary artery was normal-sized. Systolic pressure was within the normal range.  ------------------------------------------------------------------- Right atrium: The atrium was normal in size.  ------------------------------------------------------------------- Pericardium: There was no pericardial effusion.  ------------------------------------------------------------------- Systemic veins: Inferior vena cava: The vessel was normal in size. The respirophasic diameter changes were in the normal range (>= 50%), consistent with normal central venous pressure.  ------------------------------------------------------------------- Measurements  Left ventricle Value Reference LV ID, ED, PLAX chordal (L) 33.2 mm 43 - 52 LV ID, ES, PLAX chordal 23.9 mm 23 - 38 LV fx shortening, PLAX chordal (L) 28 % >=29 LV PW thickness, ED 13.4 mm --------- IVS/LV PW ratio, ED 0.96 <=1.3 Stroke volume, 2D 68 ml --------- Stroke volume/bsa, 2D 37 ml/m^2 --------- LV e&', lateral 6.53 cm/s --------- LV E/e&', lateral 8.99 --------- LV e&', medial 5.46 cm/s --------- LV E/e&', medial 10.75 --------- LV e&', average 6  cm/s --------- LV E/e&', average 9.79 ---------  Ventricular septum Value Reference IVS thickness,  ED 12.8 mm ---------  LVOT Value Reference LVOT ID, S 22 mm --------- LVOT area 3.8 cm^2 --------- LVOT ID 22 mm --------- LVOT peak velocity, S 77.5 cm/s --------- LVOT mean velocity, S 55.7 cm/s --------- LVOT VTI, S 18 cm --------- Stroke volume (SV), LVOT DP 68.4 ml --------- Stroke index (SV/bsa), LVOT DP 37.1 ml/m^2 ---------  Aortic valve Value Reference Aortic valve peak velocity, S 361 cm/s --------- Aortic valve mean velocity, S 274 cm/s --------- Aortic valve VTI, S 80.9 cm --------- Aortic mean gradient, S 34 mm Hg --------- Aortic peak gradient, S 52 mm Hg --------- VTI ratio, LVOT/AV 0.22 --------- Aortic valve area, VTI 0.85 cm^2 --------- Aortic valve area/bsa, VTI 0.46 cm^2/m^2 --------- Velocity ratio, peak, LVOT/AV 0.21 --------- Aortic valve area, peak velocity 0.82 cm^2 --------- Aortic valve area/bsa, peak 0.44 cm^2/m^2 --------- velocity Velocity ratio, mean, LVOT/AV 0.2 --------- Aortic valve area, mean velocity 0.77 cm^2 --------- Aortic valve area/bsa, mean 0.42 cm^2/m^2 --------- velocity  Aorta Value Reference Aortic root ID, ED 32 mm --------- Ascending aorta ID, A-P, S 32 mm  ---------  Left atrium Value Reference LA ID, A-P, ES 32 mm --------- LA ID/bsa, A-P 1.74 cm/m^2 <=2.2 LA volume, S 51 ml --------- LA volume/bsa, S 27.7 ml/m^2 --------- LA volume, ES, 1-p A4C 45 ml --------- LA volume/bsa, ES, 1-p A4C 24.4 ml/m^2 --------- LA volume, ES, 1-p A2C 55 ml --------- LA volume/bsa, ES, 1-p A2C 29.8 ml/m^2 ---------  Mitral valve Value Reference Mitral E-wave peak velocity 58.7 cm/s --------- Mitral A-wave peak velocity 99.7 cm/s --------- Mitral deceleration time (H) 275 ms 150 - 230 Mitral E/A ratio, peak 0.6 ---------  Pulmonary arteries Value Reference PA pressure, S, DP 22 mm Hg <=30  Tricuspid valve Value Reference Tricuspid regurg peak velocity 217 cm/s --------- Tricuspid peak RV-RA gradient 19 mm Hg ---------  Systemic veins Value Reference Estimated CVP 3 mm Hg ---------  Right ventricle Value Reference RV pressure, S, DP 22 mm Hg <=30 RV s&', lateral, S 11.7 cm/s ---------  Legend: (L) and (H) mark values outside specified reference range.  ------------------------------------------------------------------- Prepared and Electronically Authenticated by  Skeet Latch, MD 2016-12-19T09:01:54  [name], MD (Primary)       Procedures    Right/Left Heart Cath and Coronary Angiography    Conclusion    1. Patent LAD with diffuse calcification and mild nonobstructive disease 2. Moderate stenosis in the intermediate branch, stable from previous study 3. Mild RCA stenosis with patency of the previously implanted stent 4. Heavily calcified aortic valve with severe aortic stenosis, mean gradient 36 mmHg, calculated aortic valve area 1.07 cm  Continued evaluation for TAVR versus conventional AVR    Technique and Indications    INDICATION: Aortic stenosis, CAD with previous PCI  PROCEDURAL DETAILS: The right groin was prepped, draped, and anesthetized with 1% lidocaine. Using the modified Seldinger technique a 5 French sheath was placed in the right femoral artery and a 7 French sheath was placed in the right femoral vein. A Swan-Ganz catheter was used for the right heart catheterization. Standard protocol was followed for recording of right heart pressures and sampling of oxygen saturations. Fick cardiac output was calculated. Standard Judkins catheters were used for selective coronary angiography. Attempts were made to cross the aortic valve with a pigtail catheter and J-wire. This was unsuccessful. An AL-1 catheter was used to direct a straight tip wire across the valve  and LV pressure was recorded with the AL-1 catheter in place. A pullback gradient was recorded. A pigtail catheter was then advanced into the right cusp and aortography was performed in multiple projections. There were no immediate procedural complications. The patient was transferred to the post catheterization recovery area for further monitoring.  During this procedure the patient is administered a total of Versed 2 mg and Fentanyl 25 mg to achieve and maintain moderate conscious sedation. The patient's heart rate, blood pressure, and oxygen saturation are monitored continuously during the procedure. The period of conscious sedation is 30  minutes, of which I was present face-to-face 100% of this time. Estimated blood loss <50 mL. There were no immediate complications during the procedure.    Coronary Findings    Dominance: Right   Left Anterior Descending   . Prox LAD lesion, 30% stenosed. The LAD is diffusely calcified. The vessel has mild proximal stenosis without any high-grade obstruction. The diagonal branches are patent without significant disease. The vessel reaches the LV apex.     Ramus Intermedius   . Ramus lesion, 60% stenosed. There is a medium caliber intermediate branch with diffuse 60% stenosis. This vessel has previously undergone pressure wire analysis and it was negative.     Left Circumflex  After the intermediate/high marginal branch, the AV circumflex is relatively small and it supplies 2 small obtuse marginal branches without significant stenosis.     Right Coronary Artery   . Prox RCA to Mid RCA lesion, 40% stenosed. The RCA is calcified. There is a long 40% stenosis in the proximal vessel, unchanged from the previous study.   . Dist RCA lesion, 0% stenosed. Previously placed Dist RCA stent (unknown type) is patent. The previously implanted stent is widely patent without significant in-stent restenosis.       Right Heart Pressures Hemodynamic findings consistent with aortic stenosis. LV EDP is normal. Aortic valve hemodynamics: Peak to peak gradient 42 mmHg, mean gradient 36 mmHg, aortic valve area 1.07 cm    Left Heart    Aortic Valve There is severe aortic valve stenosis. The aortic valve is calcified. There is restricted aortic valve motion. The aortic valve is heavily calcified with restricted leaflet motion.    Coronary Diagrams    Diagnostic Diagram            Implants     No implant documentation for this case.    PACS Images    Show images for Cardiac catheterization     Link to Procedure Log    Procedure Log      Hemo Data       Most Recent Value    Fick Cardiac Output  6.86 L/min   Fick Cardiac Output Index  3.73 (L/min)/BSA   Aortic Mean Gradient  35.6 mmHg   Aortic Peak Gradient  42 mmHg   Aortic Valve Area  1.07   Aortic Value Area Index  0.58 cm2/BSA   RA A Wave  6 mmHg   RA V Wave  5 mmHg   RA Mean  4 mmHg   RV Systolic Pressure  22 mmHg   RV Diastolic Pressure  3 mmHg   RV EDP  7 mmHg   PA Systolic Pressure  23 mmHg   PA Diastolic Pressure  2 mmHg   PA Mean  12 mmHg   PW A Wave  14 mmHg   PW V Wave  11 mmHg   PW Mean  7 mmHg   AO Systolic  Pressure  107 mmHg   AO Diastolic Pressure  56 mmHg   AO Mean  78 mmHg   LV Systolic Pressure  Q000111Q mmHg   LV Diastolic Pressure  5 mmHg   LV EDP  11 mmHg   Arterial Occlusion Pressure Extended Systolic Pressure  123456 mmHg   Arterial Occlusion Pressure Extended Diastolic Pressure  58 mmHg   Arterial Occlusion Pressure Extended Mean Pressure  84 mmHg   Left Ventricular Apex Extended Systolic Pressure  [id number] mmHg   Left Ventricular Apex Extended Diastolic Pressure  4 mmHg   Left Ventricular Apex Extended EDP Pressure  11 mmHg   QP/QS  1   TPVR Index  3.21 HRUI   TSVR Index  20.91 HRUI   PVR SVR Ratio  0.07   TPVR/TSVR Ratio  0.15    ADDENDUM REPORT: 01/26/2016 18:27 CLINICAL DATA: 80 year old male with severe aortic stenosis. EXAM: Cardiac TAVR CT TECHNIQUE: The patient was scanned on a Philips 256 scanner. A 120 kV retrospective scan was triggered in the descending thoracic aorta at 111 HU's. Gantry rotation speed was 270 msecs and collimation was .9 mm. No beta blockade or nitro were given. The 3D data set was reconstructed in 5% intervals of the R-R cycle. Systolic and diastolic phases were analyzed on a dedicated work station using MPR, MIP and VRT modes. The patient received 80 cc of contrast. FINDINGS: Aortic Valve: Trileaflet, severely thickened and calcified with severely restricted leaflet opening. There are minimal calcifications  in the LVOT. Aorta: Normal size, mild diffuse calcifications. No dissection. Sinotubular Junction: 31 x 29 mm Ascending Thoracic Aorta: 36 x 34 mm Aortic Arch: 27 x 26 mm Descending Thoracic Aorta: 23 x 23 mm Sinus of Valsalva Measurements: Non-coronary: 32 mm Right -coronary: 32 mm Left -coronary: 32 mm Coronary Artery Height above Annulus: Left Main: 14 mm Right Coronary: 18 mm Virtual Basal Annulus Measurements: Maximum/Minimum Diameter: 27.6 x 24 mm Perimeter: 92 mm Area: 534 mm2 Optimum Fluoroscopic Angle for Delivery: LAO 10 CAU 11 IMPRESSION: 1. Trileaflet, severely thickened and calcified with severely restricted leaflet opening with annular measurements suitable for 26 mm Edward-SAPIEN 3 TAVR valve. 2. Sufficient annulus to coronary distance. 3. Optimum Fluoroscopic Angle for Delivery: LAO 10 CAU [place] Electronically Signed  By: Ena Dawley  On: 01/26/2016 18:27     Julian Hy, MD  Tue Jan 25, 2016 [id number] AM EDT     ADDENDUM REPORT: 01/25/2016 10:26 ADDENDUM: Please note that a dedicated CT chest has been performed and will be dedicated separately. In addition to the subpleural left lower lobe nodule, additional nodules are present, including: --6 x 4 mm nodule in the right upper lobe (series 412/image 13) --6 mm irregular nodule in the medial right upper lobe (series 412/image 38) --6 mm nodule in the medial left upper lobe (series 412/image 47) --9 x 8 mm nodule in the central right middle lobe (series 412/image 56) Please refer to the dedicated CT chest/abdomen/pelvis dictation for definitive recommendations based on the dominant nodule. Electronically Signed  By: Julian Hy M.D.  On: 01/25/2016 10:26     Study Result     EXAM: OVER-READ INTERPRETATION CT CHEST  The following report is an over-read performed by radiologist Dr. Julian Hy of [facility], [place] on 01/25/2016.  This over-read does not include interpretation of cardiac or coronary anatomy or pathology. The coronary CTA interpretation by the cardiologist is attached.  COMPARISON: Partial comparison to CT abdomen pelvis dated 11/12/2015.  FINDINGS: 5  x 4 mm subpleural nodule in the posterior left lower lobe (series 412/image 62).  Mild dependent atelectasis in the bilateral lower lobes. No focal consolidation. Mild subpleural reticulation. No pleural effusion or pneumothorax.  Small mediastinal lymph nodes, including a 12 mm short axis low right paratracheal node (series 413/ image 36). Calcified left perihilar/infrahilar nodes.  Visualized upper abdomen is unremarkable.  Degenerative changes of the visualized thoracolumbar spine.  IMPRESSION: 5 x 4 mm subpleural nodule in the posterior left lower lobe.  This is unchanged from 11/12/2015. Given the lack of suspicious findings for metastatic disease elsewhere on CT abdomen/pelvis, this is unlikely to be related to the patient's diagnosis of prostate cancer.  Per revised 2017 Fleischner Society guidelines, if this patient is high risk for primary bronchogenic neoplasm, a follow-up CT chest is suggested in 12 months. If low risk, no dedicated follow-up imaging is required.  Electronically Signed: By: Julian Hy M.D. On: 01/25/2016 10:10    CLINICAL DATA: 80 year old male with history of severe aortic stenosis. Preprocedural study prior to potential transcatheter aortic valve replacement (TAVR) procedure.  EXAM: CT ANGIOGRAPHY CHEST, ABDOMEN AND PELVIS  TECHNIQUE: Multidetector CT imaging through the chest, abdomen and pelvis was performed using the standard protocol during bolus administration of intravenous contrast. Multiplanar reconstructed images and MIPs were obtained and reviewed to evaluate the vascular anatomy.  CONTRAST: 62mL OMNIPAQUE IOHEXOL 350 MG/ML SOLN  COMPARISON: CT the abdomen  and pelvis 11/15/2015.  FINDINGS: CTA CHEST FINDINGS  Mediastinum/Lymph Nodes: Heart size is normal. Small amount of pericardial fluid and/or thickening, unlikely to be of any hemodynamic significance at this time. Small amount of anterior pericardial calcification. There is atherosclerosis of the thoracic aorta, the great vessels of the mediastinum and the coronary arteries, including calcified atherosclerotic plaque in the left main, left anterior descending, left circumflex and right coronary arteries. Thickening calcification of the aortic valve. Calcification of the mitral annulus. Multiple borderline enlarged and mildly enlarged mediastinal and hilar lymph nodes are noted, largest of which is a 12 mm low right paratracheal lymph node. Esophagus is unremarkable in appearance. No axillary lymphadenopathy.  Lungs/Pleura: There are multiple pulmonary nodules scattered throughout the lungs bilaterally, the largest of which measures up to 8 x 6 mm (7 mm mean diameter) in the posterior right upper lobe (image 16 of series 407), and 8 x 5 mm in the lateral segment of the right middle lobe (image 36 of series 407). Most of these are solid in appearance. However, in the medial aspect of the inferior right upper lobe shortly above the minor fissure there is also a sub solid nodule that has a ground-glass attenuation component measuring 10 x 7 mm and a central solid component it measures 2 mm (images 26 of series 407 and image 63 of series 401). No acute consolidative airspace disease. No pleural effusions. Dependent areas of scarring or subsegmental atelectasis are noted in the lungs bilaterally.  Musculoskeletal/Soft Tissues: Compression fracture of superior endplate of 624THL with a small amount of paravertebral soft tissue thickening on the right side, likely subacute, with approximately 15-20% loss of anterior vertebral body height. There are no aggressive appearing lytic or  blastic lesions noted in the visualized portions of the skeleton.  CTA ABDOMEN AND PELVIS FINDINGS  Hepatobiliary: 9 x 8 mm low attenuation lesion in the inferior aspect of segment 5 of the liver (image 197 of series 401) and tiny 3 mm low-attenuation lesion in segment 4A are both too small to definitively characterize, but are  similar to prior studies, likely tiny cysts. The liver has a slightly shrunken appearance and nodular contour, suggestive of underlying cirrhosis. Tiny calcified granuloma posteriorly in the right lobe of the liver incidentally noted. No suspicious hepatic lesions are identified. No intra or extrahepatic biliary ductal dilatation. Gallbladder is normal in appearance.  Pancreas: No pancreatic mass. No pancreatic ductal dilatation. No pancreatic or peripancreatic fluid or inflammatory changes.  Spleen: Unremarkable.  Adrenals/Urinary Tract: Exophytic 1.7 cm intermediate attenuation (38 HU) lesion in the lateral aspect of the lower pole of the right kidney is unchanged compared to numerous prior examinations, presumably a mildly proteinaceous cyst. Sub cm low-attenuation lesion in the lower pole of the left kidney is also unchanged, and although too small to definitively characterize, these are likely tiny cysts. Bilateral adrenal glands are normal in appearance. No hydroureteronephrosis. Urinary bladder wall appears slightly thickened 6, without a definite focal mass.  Stomach/Bowel: Stomach is normal in appearance. No pathologic dilatation of small bowel or colon. Colonic wall thickening in the region of the cecum, best appreciated on axial image 223 of series 401 and coronal image 41 of series 4013.  Vascular/Lymphatic: Vascular findings and measurements pertinent to potential TAVR procedure, as detailed below. No aneurysm or dissection identified in the abdominal or pelvic vasculature. No lymphadenopathy noted in the abdomen or  pelvis.  Reproductive: Fiducial markers adjacent to the prostate gland. Prostate gland and seminal vesicles are otherwise unremarkable in appearance.  Other: Ill-defined fatty tissue use in the low anatomic pelvis, potentially related to prior radiation therapy. No significant volume of ascites. No pneumoperitoneum.  Musculoskeletal: Well-defined sclerotic lesion measuring 8 mm in the left side of the L4 vertebral body is similar to prior study from 07/14/2015, likely a tiny bone island. There is also a mixed lucent and sclerotic lesion in the left side of the ileum (image 216 of series 401), which is unchanged dating back to 07/14/2015, and although indeterminate, favored to be benign, potentially an area of fibrous dysplasia.  VASCULAR MEASUREMENTS PERTINENT TO TAVR:  AORTA:  Minimal Aortic Diameter - 14 x 15 mm  Severity of Aortic Calcification - moderate  RIGHT PELVIS:  Right Common Iliac Artery -  Minimal Diameter - 10.0 x 8.7 mm  Tortuosity - moderate to severe  Calcification - mild  Right External Iliac Artery -  Minimal Diameter - 7.8 x 7.0 mm  Tortuosity - moderate to severe  Calcification - mild  Right Common Femoral Artery -  Minimal Diameter - 8.6 x 7.1 mm  Tortuosity - mild  Calcification - mild  LEFT PELVIS:  Left Common Iliac Artery -  Minimal Diameter - 9.4 x 9.2 mm  Tortuosity - moderate to severe  Calcification - mild  Left External Iliac Artery -  Minimal Diameter - 8.5 x 8.9 mm  Tortuosity - moderate to severe  Calcification - mild  Left Common Femoral Artery -  Minimal Diameter - 9.1 x 7.5 mm  Tortuosity - mild  Calcification - mild  Review of the MIP images confirms the above findings.  IMPRESSION: 1. Vascular findings and measurements pertinent to potential TAVR procedure, as detailed above. This patient appears to have suitable pelvic arterial access bilaterally, although  common iliac and external iliac arteries are rather tortuous. 2. Severe thickening calcification of the aortic valve, compatible with the reported clinical history of severe aortic stenosis. 3. Colonic wall thickening in the region of the cecum, slightly mass-like in appearance. Correlation with nonemergent colonoscopy is recommended in the near future  to exclude colonic neoplasm. 4. Multiple small pulmonary nodules in the lungs bilaterally, measuring up to 7 mm in the posterior aspect of the right upper lobe. The majority of these are solid, however, there is a sub solid nodule in the inferior right upper lobe which has a mean diameter of 9 mm with a central 2 mm solid component. Non-contrast chest CT at 3-6 months is recommended. If nodules persist, subsequent management will be based upon the most suspicious nodule(s). This recommendation follows the consensus statement: Guidelines for Management of Incidental Pulmonary Nodules Detected on CT Images:From the Fleischner Society 2017; published online before print (10.1148/radiol.IJ:2314499). 5. Mild thickening of the urinary bladder, most evident near the bladder apex, favored to be related to prior radiation therapy. No discrete bladder wall mass is identified at this time. 6. Mild pericardial thickening and calcification. No morphologic changes in the heart identified at this time to strongly suggest presence of constrictive physiology, but echocardiographic correlation is suggested. 7. Multiple borderline enlarged and minimally enlarged mediastinal and hilar lymph nodes. These are nonspecific. Attention at time of followup chest CT is recommended. 8. Additional incidental findings, as above.   Electronically Signed  By: Vinnie Langton M.D.  On: 01/25/2016 11:09   STS Calculation : risk of mortality 1.58% Morbidity or mortality 12.5% Long length of stay 5% Permanent stroke 1.6% Prolonged ventilation  6.6% Renal failure 2.8% Reoperation 7.5 Short length of stay 45%   Impression:  He has stage D severe symptomatic aortic stenosis with NYHA class II symptoms of chest discomfort and dizziness with moderate exertion. I have personally reviewed his echo and cath. He has a trileaflet aortic valve with severe calcification of the leaflets and restricted motion with a mean gradient of 34 mm Hg and a DI of 0.2. His cath shows a patent DES in the RCA and no other significant coronary disease. He is 80 years old and still trying to recover from a complicated appendectomy for perforated appendicitis requiring prolonged antibiotics. 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 I think he would have a slow and difficult recovery from open surgical AVR although his STS PROM is low. I think TAVR would be a better option for him.   The patient and his wife were counseled at length regarding treatment alternatives for management of severe symptomatic aortic stenosis. Alternative approaches such as conventional aortic valve replacement, transcatheter aortic valve replacement, and palliative medical therapy were compared and contrasted at length. The risks associated with conventional surgical aortic valve replacement were been discussed in detail, as were expectations for post-operative convalescence. Long-term prognosis with medical therapy was discussed. He would like to proceed with TAVR.  I have personally reviewed his gated cardiac CT and CTA of the chest, abdomen, and pelvis. This shows that he would be a candidate for a Sapien 3 valve via a transfemoral route.  We discussed complications that might develop including but not limited to risks of death, stroke, paravalvular leak, aortic dissection or other major vascular complications, aortic annulus rupture, device embolization, cardiac rupture or perforation, mitral regurgitation, acute  myocardial infarction, arrhythmia, heart block or bradycardia requiring permanent pacemaker placement, congestive heart failure, respiratory failure, renal failure, pneumonia, infection, other late complications related to structural valve deterioration or migration, or other complications that might ultimately cause a temporary or permanent loss of functional independence or other long term morbidity. The patient provides full informed consent for the procedure as described and all questions were  answered.       Plan:  Transfemoral TAVR on 02/15/2016    Gaye Pollack, MD 02/02/2016

## 2016-02-11 ENCOUNTER — Ambulatory Visit (HOSPITAL_COMMUNITY)
Admission: RE | Admit: 2016-02-11 | Discharge: 2016-02-11 | Disposition: A | Discharge status: Home / Self Care | Payer: Medicare Other | Source: Ambulatory Visit | Attending: Cardiovascular Disease | Admitting: Cardiovascular Disease

## 2016-02-11 ENCOUNTER — Encounter (HOSPITAL_COMMUNITY): Payer: Self-pay

## 2016-02-11 ENCOUNTER — Encounter (HOSPITAL_COMMUNITY)
Admission: RE | Admit: 2016-02-11 | Discharge: 2016-02-11 | Disposition: A | Discharge status: Home / Self Care | Payer: Medicare Other | Source: Ambulatory Visit | Attending: Cardiovascular Disease | Admitting: Cardiovascular Disease

## 2016-02-11 VITALS — BP 130/70 | HR 65 | Temp 97.8°F | Resp 18 | Ht 70.5 in | Wt 144.6 lb

## 2016-02-11 DIAGNOSIS — Z79899 Other long term (current) drug therapy: Secondary | ICD-10-CM | POA: Insufficient documentation

## 2016-02-11 DIAGNOSIS — Z01810 Encounter for preprocedural cardiovascular examination: Secondary | ICD-10-CM | POA: Insufficient documentation

## 2016-02-11 DIAGNOSIS — Z01812 Encounter for preprocedural laboratory examination: Secondary | ICD-10-CM | POA: Diagnosis not present

## 2016-02-11 DIAGNOSIS — Z01818 Encounter for other preprocedural examination: Secondary | ICD-10-CM | POA: Insufficient documentation

## 2016-02-11 DIAGNOSIS — I2510 Atherosclerotic heart disease of native coronary artery without angina pectoris: Secondary | ICD-10-CM | POA: Insufficient documentation

## 2016-02-11 DIAGNOSIS — I350 Nonrheumatic aortic (valve) stenosis: Secondary | ICD-10-CM | POA: Diagnosis not present

## 2016-02-11 LAB — URINALYSIS, ROUTINE W REFLEX MICROSCOPIC
Bilirubin Urine: NEGATIVE
Glucose, UA: NEGATIVE mg/dL
Ketones, ur: NEGATIVE mg/dL
LEUKOCYTES UA: NEGATIVE
Nitrite: NEGATIVE
PROTEIN: NEGATIVE mg/dL
SPECIFIC GRAVITY, URINE: 1.016 (ref 1.005–1.030)
pH: 7.5 (ref 5.0–8.0)

## 2016-02-11 LAB — COMPREHENSIVE METABOLIC PANEL
ALBUMIN: 3.6 g/dL (ref 3.5–5.0)
ALK PHOS: 76 U/L (ref 38–126)
ALT: 22 U/L (ref 17–63)
ANION GAP: 10 (ref 5–15)
AST: 25 U/L (ref 15–41)
BUN: 11 mg/dL (ref 6–20)
CALCIUM: 8.7 mg/dL — AB (ref 8.9–10.3)
CHLORIDE: 107 mmol/L (ref 101–111)
CO2: 20 mmol/L — AB (ref 22–32)
Creatinine, Ser: 0.74 mg/dL (ref 0.61–1.24)
GFR calc Af Amer: >60 mL/min (ref >60)
GFR calc non Af Amer: >60 mL/min (ref >60)
GLUCOSE: 87 mg/dL (ref 65–99)
Potassium: 4.3 mmol/L (ref 3.5–5.1)
SODIUM: 137 mmol/L (ref 135–145)
Total Bilirubin: 0.7 mg/dL (ref 0.3–1.2)
Total Protein: 6.6 g/dL (ref 6.5–8.1)

## 2016-02-11 LAB — URINE MICROSCOPIC-ADD ON: SQUAMOUS EPITHELIAL / LPF: NONE SEEN

## 2016-02-11 LAB — BLOOD GAS, ARTERIAL
ACID-BASE EXCESS: 1.9 mmol/L (ref 0.0–2.0)
Bicarbonate: 25.8 mEq/L — ABNORMAL HIGH (ref 20.0–24.0)
Drawn by: 449841
FIO2: 0.21
O2 SAT: 97.8 %
PATIENT TEMPERATURE: 98.6
TCO2: 27.1 mmol/L (ref 0–100)
pCO2 arterial: 39.5 mmHg (ref 35.0–45.0)
pH, Arterial: 7.431 (ref 7.350–7.450)
pO2, Arterial: 103 mmHg — ABNORMAL HIGH (ref 80.0–100.0)

## 2016-02-11 LAB — SURGICAL PCR SCREEN
MRSA, PCR: NEGATIVE
Staphylococcus aureus: NEGATIVE

## 2016-02-11 LAB — CBC
HCT: 39.4 % (ref 39.0–52.0)
HEMOGLOBIN: 12.8 g/dL — AB (ref 13.0–17.0)
MCH: 29.8 pg (ref 26.0–34.0)
MCHC: 32.5 g/dL (ref 30.0–36.0)
MCV: 91.8 fL (ref 78.0–100.0)
PLATELETS: 179 10*3/uL (ref 150–400)
RBC: 4.29 MIL/uL (ref 4.22–5.81)
RDW: 14.8 % (ref 11.5–15.5)
WBC: 4.4 10*3/uL (ref 4.0–10.5)

## 2016-02-11 LAB — APTT: aPTT: 38 seconds — ABNORMAL HIGH (ref 24–37)

## 2016-02-11 LAB — PROTIME-INR
INR: 1.19 (ref 0.00–1.49)
Prothrombin Time: 15.3 seconds — ABNORMAL HIGH (ref 11.6–15.2)

## 2016-02-11 NOTE — Pre-Procedure Instructions (Signed)
Corey Oneill  02/11/2016      [facility] - [place], [place] - [address] [place] [address] Phone: [phone] Fax: [phone]    Your procedure is scheduled on  Tuesday  02/15/16  Report to [place] at 800 A.M.  Call this number if you have problems the morning of surgery:  [phone]   Remember:  Do not eat food or drink liquids after midnight.  Take these medicines the morning of surgery with A SIP OF WATER  Albuterol inhaler, bupropion (wellbutrin), fluoxetine (prozac)     (STOP ASPIRIN, CO-ENZYME Q 10, HERBAL MEDICINES)   Do not wear jewelry, make-up or nail polish.  Do not wear lotions, powders, or perfumes.  You may wear deodorant.  Do not shave 48 hours prior to surgery.  Men may shave face and neck.  Do not bring valuables to the hospital.  [facility] is not responsible for any belongings or valuables.  Contacts, dentures or bridgework may not be worn into surgery.  Leave your suitcase in the car.  After surgery it may be brought to your room.  For patients admitted to the hospital, discharge time will be determined by your treatment team.  Patients discharged the day of surgery will not be allowed to drive home.   Name and phone number of your driver:    Special instructions:  [facility] - Preparing for Surgery  Before surgery, you can play an important role.  Because skin is not sterile, your skin needs to be as free of germs as possible.  You can reduce the number of germs on you skin by washing with CHG (chlorahexidine gluconate) soap before surgery.  CHG is an antiseptic cleaner which kills germs and bonds with the skin to continue killing germs even after washing.  Please DO NOT use if you have an allergy to CHG or antibacterial soaps.  If your skin becomes reddened/irritated stop using the CHG and inform your nurse when you arrive at Short Stay.  Do not shave (including legs and  underarms) for at least 48 hours prior to the first CHG shower.  You may shave your face.  Please follow these instructions carefully:   1.  Shower with CHG Soap the night before surgery and the                                morning of Surgery.  2.  If you choose to wash your hair, wash your hair first as usual with your       normal shampoo.  3.  After you shampoo, rinse your hair and body thoroughly to remove the                      Shampoo.  4.  Use CHG as you would any other liquid soap.  You can apply chg directly       to the skin and wash gently with scrungie or a clean washcloth.  5.  Apply the CHG Soap to your body ONLY FROM THE NECK DOWN.        Do not use on open wounds or open sores.  Avoid contact with your eyes,       ears, mouth and genitals (private parts).  Wash genitals (private parts)       with your normal soap.  6.  Wash thoroughly, paying special attention to the area where your surgery        will be performed.  7.  Thoroughly rinse your body with warm water from the neck down.  8.  DO NOT shower/wash with your normal soap after using and rinsing off       the CHG Soap.  9.  Pat yourself dry with a clean towel.            10.  Wear clean pajamas.            11.  Place clean sheets on your bed the night of your first shower and do not        sleep with pets.  Day of Surgery  Do not apply any lotions/deoderants the morning of surgery.  Please wear clean clothes to the hospital/surgery center.    Please read over the following fact sheets that you were given. Pain Booklet, Coughing and Deep Breathing, Blood Transfusion Information, MRSA Information and Surgical Site Infection Prevention

## 2016-02-12 LAB — HEMOGLOBIN A1C
HEMOGLOBIN A1C: 4.9 % (ref 4.8–5.6)
Mean Plasma Glucose: 94 mg/dL

## 2016-02-14 MED ORDER — EPINEPHRINE HCL 1 MG/ML IJ SOLN
0.0000 ug/min | INTRAMUSCULAR | Status: DC
  Filled 2016-02-14: qty 4

## 2016-02-14 MED ORDER — DOPAMINE-DEXTROSE 3.2-5 MG/ML-% IV SOLN
0.0000 ug/kg/min | INTRAVENOUS | Status: DC
  Filled 2016-02-14: qty 250

## 2016-02-14 MED ORDER — VANCOMYCIN HCL 10 G IV SOLR
1250.0000 mg | INTRAVENOUS | Status: AC
  Administered 2016-02-15: 1250 mg via INTRAVENOUS
  Filled 2016-02-14: qty 1250

## 2016-02-14 MED ORDER — SODIUM CHLORIDE 0.9 % IV SOLN
INTRAVENOUS | Status: DC
  Filled 2016-02-14: qty 30

## 2016-02-14 MED ORDER — SODIUM CHLORIDE 0.9 % IV SOLN: INTRAVENOUS | Status: DC

## 2016-02-14 MED ORDER — SODIUM CHLORIDE 0.9 % IV SOLN
INTRAVENOUS | Status: DC
  Filled 2016-02-14: qty 2.5

## 2016-02-14 MED ORDER — DEXMEDETOMIDINE HCL IN NACL 400 MCG/100ML IV SOLN
0.1000 ug/kg/h | INTRAVENOUS | Status: AC
  Administered 2016-02-15: .5 ug/kg/h via INTRAVENOUS
  Filled 2016-02-14: qty 100

## 2016-02-14 MED ORDER — POTASSIUM CHLORIDE 2 MEQ/ML IV SOLN
80.0000 meq | INTRAVENOUS | Status: DC
  Filled 2016-02-14: qty 40

## 2016-02-14 MED ORDER — CHLORHEXIDINE GLUCONATE 0.12 % MT SOLN
15.0000 mL | Freq: Once | OROMUCOSAL | Status: AC
  Administered 2016-02-15: 15 mL via OROMUCOSAL
  Filled 2016-02-14: qty 15

## 2016-02-14 MED ORDER — CEFUROXIME SODIUM 1.5 G IJ SOLR
1.5000 g | INTRAMUSCULAR | Status: AC
  Administered 2016-02-15: 1.5 g via INTRAVENOUS
  Filled 2016-02-14: qty 1.5

## 2016-02-14 MED ORDER — MAGNESIUM SULFATE 50 % IJ SOLN
40.0000 meq | INTRAMUSCULAR | Status: DC
  Filled 2016-02-14: qty 10

## 2016-02-14 MED ORDER — CHLORHEXIDINE GLUCONATE 4 % EX LIQD: 60.0000 mL | Freq: Once | CUTANEOUS | Status: DC

## 2016-02-14 MED ORDER — NITROGLYCERIN IN D5W 200-5 MCG/ML-% IV SOLN
2.0000 ug/min | INTRAVENOUS | Status: DC
  Filled 2016-02-14: qty 250

## 2016-02-14 MED ORDER — PHENYLEPHRINE HCL 10 MG/ML IJ SOLN
30.0000 ug/min | INTRAVENOUS | Status: DC
  Filled 2016-02-14: qty 2

## 2016-02-14 MED ORDER — CHLORHEXIDINE GLUCONATE 4 % EX LIQD: 30.0000 mL | CUTANEOUS | Status: DC

## 2016-02-14 MED ORDER — NOREPINEPHRINE BITARTRATE 1 MG/ML IV SOLN
0.0000 ug/min | INTRAVENOUS | Status: DC
  Filled 2016-02-14: qty 4

## 2016-02-14 NOTE — H&P (Signed)
[address]       [place] [address]             [phone]      Cardiothoracic Surgery History and Physical   Referring Provider is [name], MD PCP is [name], MD  Chief Complaint  Patient presents with  . Aortic Stenosis        HPI:  The patient is a 80 year old gentleman with a history of coronary artery disease and aortic stenosis who presented in 03/2015 with exertional angina and moderate AS by echo. He underwent cath on 04/21/2015 showing moderate coronary disease and moderate AS and medical therapy was recommended. He continued having angina and repeat cath in August 2016 showed a 60% distal RCA stenosis with an FFR of 0.69. This was treated with a DES. There was still moderate AS with a mean gradient of 35.6 mm Hg and a peak of 41. AVA was 1.21 cm2. He says that his symptoms did not improve with PCI. He ha a repeat cath four days later that showed a patent stent and no new source for angina. He continued to have some chest discomfort with exertion that he describes as an aching in his chest. He denies shortness of breath and orthopnea. He has had some dizziness. A repeat echo was done on 11/01/2015 showing a severely calcified aortic valve with a mean gradient of 34 mm Hg and a DI of 0.2. His EF was 55-60%. He was admitted in January 2017 with a perforated appendicitis and has had a slow recovery. He was subsequently admitted in early February with chest pain and had negative enzymes. He was seen by Dr. Burt Knack for consideration of TAVR and a repeat cath on 01/17/2016 showed a patent RCA stent and a heavily calcified AV with a mean gradient of 36 mm Hg.  Past Medical History  Diagnosis Date  . Hyperlipidemia   . Esophageal reflux   . Hx of radiation therapy 04/14/13-06/09/13    prostate 7800 cGy, 40 sessions, seminal vesicles 5600 cGy 40 sessions  . CAD (coronary artery disease)     a. LHC 8/16: Mid to  distal LAD 30%, OM1 40%, proximal mid RCA 40%, distal RCA 60% >> FFR 0.69 >> PCI: 3 x 15 mm Resolute DES  . Aortic stenosis     a. peak to peak gradient by LHC 8/16: 37 mmHg (moderately severe)   . Radiation proctitis   . Family history of adverse reaction to anesthesia     "daughter has PONV"  . Heart murmur   . Anginal pain ([place])   . H/O blood clots     "get them in my stool and urine" (11/12/2015)  . Depression   . Prostate cancer [facility]) dx'd 2014    Past Surgical History  Procedure Laterality Date  . Nasal fracture surgery      "broken years ago; several ORs to correct it"  . Tonsillectomy    . Prostate biopsy  2014    "needle biopsy"  . Colonoscopy    . Cardiac catheterization N/A 04/21/2015    Procedure: Right/Left Heart Cath and Coronary Angiography; Surgeon: [name], MD; Location: [place] [facility]; Service: Cardiovascular; Laterality: N/A;  . Cardiac catheterization N/A 06/18/2015    Procedure: Intravascular Pressure Wire/FFR Study; Surgeon: [name], MD; Location: [place] [facility]; Service: Cardiovascular; Laterality: N/A;  . Cardiac catheterization N/A 06/18/2015    Procedure: Coronary Stent Intervention; Surgeon:  [name], MD; Location: [place] [facility]; Service: Cardiovascular; Laterality: N/A;  . Cardiac catheterization N/A 06/18/2015    Procedure: Right/Left Heart Cath and Coronary Angiography; Surgeon: [name], MD; Location: [place] [facility]; Service: Cardiovascular; Laterality: N/A;  . Cardiac catheterization N/A 06/23/2015    Procedure: Left Heart Cath and Cors/Grafts Angiography; Surgeon: [name], MD; Location: [place] [facility]; Service: Cardiovascular; Laterality: N/A;  . Nasal septum surgery    . Inguinal hernia repair    . Fracture surgery    . Laparoscopic appendectomy N/A 11/16/2015    Procedure:  APPENDECTOMY LAPAROSCOPIC; Surgeon: [name], MD; Location: [place]; Service: General; Laterality: N/A;  . Cardiac catheterization N/A 01/17/2016    Procedure: Right/Left Heart Cath and Coronary Angiography; Surgeon: [name], MD; Location: [place] [facility]; Service: Cardiovascular; Laterality: N/A;    Family History  Problem Relation Age of Onset  . Brain cancer Father   . Depression Daughter   . Lymphoma Mother     Social History   Social History  . Marital Status: Married    Spouse Name: N/A  . Number of Children: 2  . Years of Education: N/A   Occupational History  . Not on file.   Social History Main Topics  . Smoking status: Former Smoker -- 0.00 packs/day    Types: Cigarettes  . Smokeless tobacco: Never Used     Comment: "quit smoking cigarettes in the 1960s; quit smoking cigars in the 1990s  . Alcohol Use: Yes  . Drug Use: No  . Sexual Activity: Not on file   Other Topics Concern  . Not on file   Social History Narrative    Current Outpatient Prescriptions  Medication Sig Dispense Refill  . acetaminophen (TYLENOL) 500 MG tablet Take 500 mg by mouth every 8 (eight) hours as needed for mild pain. Reported on 11/26/2015    . albuterol (PROVENTIL HFA;VENTOLIN HFA) 108 (90 Base) MCG/ACT inhaler Inhale 1-2 puffs into the lungs at bedtime. Reported on 01/25/2016    . aspirin EC 81 MG tablet Take 81 mg by mouth daily.    [name] buPROPion (WELLBUTRIN SR) 100 MG 12 hr tablet Take 100 mg by mouth daily.     . clopidogrel (PLAVIX) 75 MG tablet Take 1 tablet (75 mg total) by mouth daily. 90 tablet 3  . co-enzyme Q-10 50 MG capsule Take 50 mg by mouth daily.    . ferrous fumarate (HEMOCYTE - 106 MG FE) 325 (106 FE) MG TABS tablet Take 1 tablet (106 mg of iron total) by mouth daily. 30 each 0  . FLUoxetine (PROZAC) 10 MG capsule Take 1  capsule (10 mg total) by mouth daily. 90 capsule 3  . Magnesium 250 MG TABS Take 250 mg by mouth at bedtime.     . nitroGLYCERIN (NITROSTAT) 0.4 MG SL tablet Place 0.4 mg under the tongue every 5 (five) minutes as needed for chest pain. Reported on 11/26/2015    . rosuvastatin (CRESTOR) 10 MG tablet Take 10 mg by mouth at bedtime.     No current facility-administered medications for this visit.    No Known Allergies    Review of Systems:  General:decreased appetite, decreased energy, no weight gain, some weight loss, no fever Cardiac:has chest pain with exertion, no chest pain at rest, no SOB with exertion, no resting SOB, no PND, no orthopnea, no palpitations, no arrhythmia, no atrial fibrillation, no LE edema, has dizzy spells, no syncope Respiratory:no shortness of  breath, no home oxygen, no productive cough, no dry cough, no bronchitis, no wheezing, no hemoptysis, no asthma, no pain with inspiration or cough, no sleep apnea, no CPAP at night GI:no difficulty swallowing, no reflux, no frequent heartburn, no hiatal hernia, no abdominal pain, no constipation, no diarrhea, has hematochezia related to radiation proctitis, no hematemesis, no melena GU:no dysuria, no frequency, no urinary tract infection, has hematuria, no enlarged prostate, no kidney stones, no kidney disease Vascular:no pain suggestive of claudication, no pain in feet, no leg cramps, no varicose veins, no DVT, no non-healing foot ulcer Neuro:no stroke, no TIA's, no seizures, no headaches, notemporary blindness one eye, no slurred speech, no peripheral neuropathy, no chronic pain, no instability of gait, no memory/cognitive  dysfunction Musculoskeletal:no arthritis, no joint swelling, no myalgias, no difficulty walking, normal mobility  Skin:no rash, no itching, no skin infections, no pressure sores or ulcerations Psych:no anxiety, treated for depression, no nervousness, no unusual recent stress Eyes:no blurry vision, no floaters, no recent vision changes, wears glasses or contacts ENT:no hearing loss, no loose or painful teeth, no dentures, last saw dentist this year Hematologic:no easy bruising, no abnormal bleeding, no clotting disorder, no frequent epistaxis Endocrine:no diabetes, does not check CBG's at home       Physical Exam:  BP 130/85 mmHg  Pulse 78  Resp 20  Ht 6' (1.829 m)  Wt 145 lb (65.772 kg)  BMI 19.66 kg/m2  SpO2 95% General:Elderly but well-appearing HEENT:Unremarkable , NCAT, PERLA, EOMI, oropharynx clear Neck:no JVD, no bruits, no adenopathy or thyromegaly Chest:clear to auscultation, symmetrical breath sounds, no wheezes, no rhonchi  CV:RRR, grade III/VI crescendo/decrescendo murmur heard best at RSB, no diastolic murmur Abdomen:soft, non-tender, no masses or organomegaly Extremities:warm, well-perfused, pulses palpable, no LE edema Rectal/GUDeferred Neuro:Grossly non-focal and symmetrical throughout Skin:Clean and dry, no rashes, no  breakdown   Diagnostic Tests:   [facility]  [address], [place] [address]  [phone]  ------------------------------------------------------------------- Transthoracic Echocardiography  Patient: Riyansh, [place] MR #: [id number] Study Date: 11/01/2015 Gender: M Age: 60 Height: 182.9 cm Weight: 67.6 kg BSA: 1.84 m^2 Pt. Status: Room:  SONOGRAPHER [place], Will ATTENDING Despina Pole, Marlane Hatcher REFERRING Truitt Merle C PERFORMING [facility], Outpatient  cc:  ------------------------------------------------------------------- LV EF: 55% - 60%  ------------------------------------------------------------------- Indications: (I35.0).  ------------------------------------------------------------------- History: PMH: Acquired from the patient and from the patient&'s chart. Coronary artery disease. Aortic stenosis. Risk factors: Dyslipidemia.  ------------------------------------------------------------------- Study Conclusions  - Left ventricle: The cavity size was normal. There was mild  concentric hypertrophy. Systolic function was normal. The  estimated ejection fraction was in the range of 55% to 60%. Wall  motion was normal; there were no regional wall motion  abnormalities. Doppler parameters are consistent with abnormal  left ventricular relaxation (grade 1 diastolic dysfunction). - Aortic valve: Valve mobility was restricted. There was severe  stenosis. Valve area (Vmax): 0.82 cm^2. - Mitral valve: Moderately calcified annulus. - Right ventricle: The cavity size was normal. Wall thickness was  normal. Systolic function was normal. - Tricuspid valve: There was trivial regurgitation. - Inferior vena cava: The vessel was normal in size. The  respirophasic diameter  changes were in the normal range (>= 50%),  consistent with normal central venous pressure.  Impressions:  - Although peak velocity and mean gradient calculate as moderate,  there appears to be severe stenosis of the aortic valve. The  calculated valve area indicates severe stenosis. It is likely  that the peak  gradient was not identified in this study. Study is  limited by poor sound transmission.  Transthoracic echocardiography. M-mode, complete 2D, spectral Doppler, and color Doppler. Birthdate: Patient birthdate: 1936/06/17. Age: Patient is 80 yr old. Sex: Gender: male. BMI: 20.2 kg/m^2. Blood pressure: 110/64 Patient status: Outpatient. Study date: Study date: 11/01/2015. Study time: 07:56 AM. Location: [facility]  -------------------------------------------------------------------  ------------------------------------------------------------------- Left ventricle: The cavity size was normal. There was mild concentric hypertrophy. Systolic function was normal. The estimated ejection fraction was in the range of 55% to 60%. Wall motion was normal; there were no regional wall motion abnormalities. Doppler parameters are consistent with abnormal left ventricular relaxation (grade 1 diastolic dysfunction).  ------------------------------------------------------------------- Aortic valve: Trileaflet; moderately thickened, severely calcified leaflets. Valve mobility was restricted. Doppler: There was severe stenosis. There was no regurgitation. VTI ratio of LVOT to aortic valve: 0.22. Valve area (VTI): 0.85 cm^2. Indexed valve area (VTI): 0.46 cm^2/m^2. Peak velocity ratio of LVOT to aortic valve: 0.21. Valve area (Vmax): 0.82 cm^2. Indexed valve area (Vmax): 0.44 cm^2/m^2. Mean velocity ratio of LVOT to aortic valve: 0.2. Valve area (Vmean): 0.77 cm^2. Indexed valve area (Vmean): 0.42 cm^2/m^2. Mean gradient (S): 34 mm Hg.  Peak gradient (S): 52 mm Hg.  ------------------------------------------------------------------- Aorta: Aortic root: The aortic root was normal in size.  ------------------------------------------------------------------- Mitral valve: Moderately calcified annulus. Mobility was not restricted. Doppler: Transvalvular velocity was within the normal range. There was no evidence for stenosis. There was no regurgitation.  ------------------------------------------------------------------- Left atrium: The atrium was normal in size.  ------------------------------------------------------------------- Right ventricle: The cavity size was normal. Wall thickness was normal. Systolic function was normal.  ------------------------------------------------------------------- Pulmonic valve: Poorly visualized. Doppler: Transvalvular velocity was within the normal range. There was no evidence for stenosis.  ------------------------------------------------------------------- Tricuspid valve: Structurally normal valve. Doppler: Transvalvular velocity was within the normal range. There was trivial regurgitation.  ------------------------------------------------------------------- Pulmonary artery: The main pulmonary artery was normal-sized. Systolic pressure was within the normal range.  ------------------------------------------------------------------- Right atrium: The atrium was normal in size.  ------------------------------------------------------------------- Pericardium: There was no pericardial effusion.  ------------------------------------------------------------------- Systemic veins: Inferior vena cava: The vessel was normal in size. The respirophasic diameter changes were in the normal range (>= 50%), consistent with normal central venous pressure.  ------------------------------------------------------------------- Measurements  Left  ventricle Value Reference LV ID, ED, PLAX chordal (L) 33.2 mm 43 - 52 LV ID, ES, PLAX chordal 23.9 mm 23 - 38 LV fx shortening, PLAX chordal (L) 28 % >=29 LV PW thickness, ED 13.4 mm --------- IVS/LV PW ratio, ED 0.96 <=1.3 Stroke volume, 2D 68 ml --------- Stroke volume/bsa, 2D 37 ml/m^2 --------- LV e&', lateral 6.53 cm/s --------- LV E/e&', lateral 8.99 --------- LV e&', medial 5.46 cm/s --------- LV E/e&', medial 10.75 --------- LV e&', average 6 cm/s --------- LV E/e&', average 9.79 ---------  Ventricular septum Value Reference IVS thickness, ED 12.8 mm ---------  LVOT Value Reference LVOT ID, S 22 mm --------- LVOT area 3.8 cm^2 --------- LVOT ID 22 mm --------- LVOT peak velocity, S 77.5 cm/s --------- LVOT mean velocity, S 55.7 cm/s --------- LVOT VTI, S 18 cm --------- Stroke volume (SV), LVOT DP 68.4 ml --------- Stroke index (SV/bsa), LVOT DP 37.1 ml/m^2 ---------  Aortic valve Value Reference Aortic valve peak velocity, S 361 cm/s --------- Aortic valve mean velocity, S 274 cm/s --------- Aortic valve VTI,  S 80.9 cm --------- Aortic mean gradient, S 34 mm Hg --------- Aortic peak gradient, S 52 mm Hg --------- VTI ratio, LVOT/AV 0.22 --------- Aortic  valve area, VTI 0.85 cm^2 --------- Aortic valve area/bsa, VTI 0.46 cm^2/m^2 --------- Velocity ratio, peak, LVOT/AV 0.21 --------- Aortic valve area, peak velocity 0.82 cm^2 --------- Aortic valve area/bsa, peak 0.44 cm^2/m^2 --------- velocity Velocity ratio, mean, LVOT/AV 0.2 --------- Aortic valve area, mean velocity 0.77 cm^2 --------- Aortic valve area/bsa, mean 0.42 cm^2/m^2 --------- velocity  Aorta Value Reference Aortic root ID, ED 32 mm --------- Ascending aorta ID, A-P, S 32 mm ---------  Left atrium Value Reference LA ID, A-P, ES 32 mm --------- LA ID/bsa, A-P 1.74 cm/m^2 <=2.2 LA volume, S 51 ml --------- LA volume/bsa, S 27.7 ml/m^2 --------- LA volume, ES, 1-p A4C 45 ml --------- LA volume/bsa, ES, 1-p A4C 24.4 ml/m^2 --------- LA volume, ES, 1-p A2C 55 ml --------- LA volume/bsa, ES, 1-p A2C 29.8 ml/m^2 ---------  Mitral valve Value Reference Mitral E-wave peak velocity 58.7 cm/s --------- Mitral A-wave peak velocity 99.7 cm/s --------- Mitral deceleration time (H) 275 ms 150 -  230 Mitral E/A ratio, peak 0.6 ---------  Pulmonary arteries Value Reference PA pressure, S, DP 22 mm Hg <=30  Tricuspid valve Value Reference Tricuspid regurg peak velocity 217 cm/s --------- Tricuspid peak RV-RA gradient 19 mm Hg ---------  Systemic veins Value Reference Estimated CVP 3 mm Hg ---------  Right ventricle Value Reference RV pressure, S, DP 22 mm Hg <=30 RV s&', lateral, S 11.7 cm/s ---------  Legend: (L) and (H) mark values outside specified reference range.  ------------------------------------------------------------------- Prepared and Electronically Authenticated by  Skeet Latch, MD 2016-12-19T09:01:54  [name], MD (Primary)      Procedures    Right/Left Heart Cath and Coronary Angiography    Conclusion    1. Patent LAD with diffuse calcification and mild nonobstructive disease 2. Moderate stenosis in the intermediate branch, stable from previous study 3. Mild RCA stenosis with patency of the previously implanted stent 4. Heavily calcified aortic valve with severe aortic stenosis, mean gradient 36 mmHg, calculated aortic valve area 1.07 cm  Continued evaluation for TAVR versus conventional AVR    Technique and Indications    INDICATION: Aortic stenosis, CAD with previous PCI  PROCEDURAL DETAILS: The right groin was prepped, draped, and anesthetized with 1% lidocaine. Using the modified Seldinger technique a 5 French sheath was placed in the right femoral artery and a 7 French sheath was placed in the right femoral vein. A Swan-Ganz catheter was used for the right heart  catheterization. Standard protocol was followed for recording of right heart pressures and sampling of oxygen saturations. Fick cardiac output was calculated. Standard Judkins catheters were used for selective coronary angiography. Attempts were made to cross the aortic valve with a pigtail catheter and J-wire. This was unsuccessful. An AL-1 catheter was used to direct a straight tip wire across the valve and LV pressure was recorded with the AL-1 catheter in place. A pullback gradient was recorded. A pigtail catheter was then advanced into the right cusp and aortography was performed in multiple projections. There were no immediate procedural complications. The patient was transferred to the post catheterization recovery area for further monitoring.  During this procedure the patient is administered a total of Versed 2 mg and Fentanyl 25 mg to achieve and maintain moderate conscious sedation. The patient's heart rate, blood pressure, and oxygen saturation are monitored continuously during the procedure. The period of conscious sedation is 30 minutes, of which I was present face-to-face 100% of this time. Estimated blood loss <50 mL. There were no immediate complications during the procedure.  Coronary Findings    Dominance: Right   Left Anterior Descending   . Prox LAD lesion, 30% stenosed. The LAD is diffusely calcified. The vessel has mild proximal stenosis without any high-grade obstruction. The diagonal branches are patent without significant disease. The vessel reaches the LV apex.     Ramus Intermedius   . Ramus lesion, 60% stenosed. There is a medium caliber intermediate branch with diffuse 60% stenosis. This vessel has previously undergone pressure wire analysis and it was negative.     Left Circumflex  After the intermediate/high marginal branch, the AV circumflex is relatively small and it supplies 2 small obtuse marginal branches without significant stenosis.       Right Coronary Artery   . Prox RCA to Mid RCA lesion, 40% stenosed. The RCA is calcified. There is a long 40% stenosis in the proximal vessel, unchanged from the previous study.   . Dist RCA lesion, 0% stenosed. Previously placed Dist RCA stent (unknown type) is patent. The previously implanted stent is widely patent without significant in-stent restenosis.       Right Heart Pressures Hemodynamic findings consistent with aortic stenosis. LV EDP is normal. Aortic valve hemodynamics: Peak to peak gradient 42 mmHg, mean gradient 36 mmHg, aortic valve area 1.07 cm    Left Heart    Aortic Valve There is severe aortic valve stenosis. The aortic valve is calcified. There is restricted aortic valve motion. The aortic valve is heavily calcified with restricted leaflet motion.    Coronary Diagrams    Diagnostic Diagram            Implants    No implant documentation for this case.    PACS Images    Show images for Cardiac catheterization     Link to Procedure Log    Procedure Log      Hemo Data       Most Recent Value   Fick Cardiac Output  6.86 L/min   Fick Cardiac Output Index  3.73 (L/min)/BSA   Aortic Mean Gradient  35.6 mmHg   Aortic Peak Gradient  42 mmHg   Aortic Valve Area  1.07   Aortic Value Area Index  0.58 cm2/BSA   RA A Wave  6 mmHg   RA V Wave  5 mmHg   RA Mean  4 mmHg   RV Systolic Pressure  22 mmHg   RV Diastolic Pressure  3 mmHg   RV EDP  7 mmHg   PA Systolic Pressure  23 mmHg   PA Diastolic Pressure  2 mmHg   PA Mean  12 mmHg   PW A Wave  14 mmHg   PW V Wave  11 mmHg   PW Mean  7 mmHg   AO Systolic Pressure  [id number] mmHg   AO Diastolic Pressure  56 mmHg   AO Mean  78 mmHg   LV Systolic Pressure  [id number] mmHg   LV Diastolic Pressure  5 mmHg   LV EDP  11 mmHg   Arterial  Occlusion Pressure Extended Systolic Pressure  123456 mmHg   Arterial Occlusion Pressure Extended Diastolic Pressure  58 mmHg   Arterial Occlusion Pressure Extended Mean Pressure  84 mmHg   Left Ventricular Apex Extended Systolic Pressure  [id number] mmHg   Left Ventricular Apex Extended Diastolic Pressure  4 mmHg   Left Ventricular Apex Extended EDP Pressure  11 mmHg   QP/QS  1   TPVR Index  3.21 HRUI   TSVR Index  20.91  HRUI   PVR SVR Ratio  0.07   TPVR/TSVR Ratio  0.15    ADDENDUM REPORT: 01/26/2016 18:27 CLINICAL DATA: 80 year old male with severe aortic stenosis. EXAM: Cardiac TAVR CT TECHNIQUE: The patient was scanned on a Philips 256 scanner. A 120 kV retrospective scan was triggered in the descending thoracic aorta at 111 HU's. Gantry rotation speed was 270 msecs and collimation was .9 mm. No beta blockade or nitro were given. The 3D data set was reconstructed in 5% intervals of the R-R cycle. Systolic and diastolic phases were analyzed on a dedicated work station using MPR, MIP and VRT modes. The patient received 80 cc of contrast. FINDINGS: Aortic Valve: Trileaflet, severely thickened and calcified with severely restricted leaflet opening. There are minimal calcifications in the LVOT. Aorta: Normal size, mild diffuse calcifications. No dissection. Sinotubular Junction: 31 x 29 mm Ascending Thoracic Aorta: 36 x 34 mm Aortic Arch: 27 x 26 mm Descending Thoracic Aorta: 23 x 23 mm Sinus of Valsalva Measurements: Non-coronary: 32 mm Right -coronary: 32 mm Left -coronary: 32 mm Coronary Artery Height above Annulus: Left Main: 14 mm Right Coronary: 18 mm Virtual Basal Annulus Measurements: Maximum/Minimum Diameter: 27.6 x 24 mm Perimeter: 92 mm Area: 534 mm2 Optimum Fluoroscopic Angle for Delivery: LAO 10 CAU 11 IMPRESSION: 1. Trileaflet, severely thickened and calcified with severely restricted leaflet  opening with annular measurements suitable for 26 mm Edward-SAPIEN 3 TAVR valve. 2. Sufficient annulus to coronary distance. 3. Optimum Fluoroscopic Angle for Delivery: LAO 10 CAU [place] Electronically Signed  By: Ena Dawley  On: 01/26/2016 18:27     Julian Hy, MD  Tue Jan 25, 2016 [id number] AM EDT     ADDENDUM REPORT: 01/25/2016 10:26 ADDENDUM: Please note that a dedicated CT chest has been performed and will be dedicated separately. In addition to the subpleural left lower lobe nodule, additional nodules are present, including: --6 x 4 mm nodule in the right upper lobe (series 412/image 13) --6 mm irregular nodule in the medial right upper lobe (series 412/image 38) --6 mm nodule in the medial left upper lobe (series 412/image 47) --9 x 8 mm nodule in the central right middle lobe (series 412/image 56) Please refer to the dedicated CT chest/abdomen/pelvis dictation for definitive recommendations based on the dominant nodule. Electronically Signed  By: Julian Hy M.D.  On: 01/25/2016 10:26     Study Result     EXAM: OVER-READ INTERPRETATION CT CHEST  The following report is an over-read performed by radiologist Dr. Julian Hy of [facility], [place] on 01/25/2016. This over-read does not include interpretation of cardiac or coronary anatomy or pathology. The coronary CTA interpretation by the cardiologist is attached.  COMPARISON: Partial comparison to CT abdomen pelvis dated 11/12/2015.  FINDINGS: 5 x 4 mm subpleural nodule in the posterior left lower lobe (series 412/image 62).  Mild dependent atelectasis in the bilateral lower lobes. No focal consolidation. Mild subpleural reticulation. No pleural effusion or pneumothorax.  Small mediastinal lymph nodes, including a 12 mm short axis low right paratracheal node (series 413/ image 36). Calcified left perihilar/infrahilar  nodes.  Visualized upper abdomen is unremarkable.  Degenerative changes of the visualized thoracolumbar spine.  IMPRESSION: 5 x 4 mm subpleural nodule in the posterior left lower lobe.  This is unchanged from 11/12/2015. Given the lack of suspicious findings for metastatic disease elsewhere on CT abdomen/pelvis, this is unlikely to be related to the patient's diagnosis of prostate cancer.  Per revised 2017 Fleischner Society guidelines, if this  patient is high risk for primary bronchogenic neoplasm, a follow-up CT chest is suggested in 12 months. If low risk, no dedicated follow-up imaging is required.  Electronically Signed: By: Julian Hy M.D. On: 01/25/2016 10:10    CLINICAL DATA: 80 year old male with history of severe aortic stenosis. Preprocedural study prior to potential transcatheter aortic valve replacement (TAVR) procedure.  EXAM: CT ANGIOGRAPHY CHEST, ABDOMEN AND PELVIS  TECHNIQUE: Multidetector CT imaging through the chest, abdomen and pelvis was performed using the standard protocol during bolus administration of intravenous contrast. Multiplanar reconstructed images and MIPs were obtained and reviewed to evaluate the vascular anatomy.  CONTRAST: 61mL OMNIPAQUE IOHEXOL 350 MG/ML SOLN  COMPARISON: CT the abdomen and pelvis 11/15/2015.  FINDINGS: CTA CHEST FINDINGS  Mediastinum/Lymph Nodes: Heart size is normal. Small amount of pericardial fluid and/or thickening, unlikely to be of any hemodynamic significance at this time. Small amount of anterior pericardial calcification. There is atherosclerosis of the thoracic aorta, the great vessels of the mediastinum and the coronary arteries, including calcified atherosclerotic plaque in the left main, left anterior descending, left circumflex and right coronary arteries. Thickening calcification of the aortic valve. Calcification of the mitral annulus. Multiple borderline enlarged and  mildly enlarged mediastinal and hilar lymph nodes are noted, largest of which is a 12 mm low right paratracheal lymph node. Esophagus is unremarkable in appearance. No axillary lymphadenopathy.  Lungs/Pleura: There are multiple pulmonary nodules scattered throughout the lungs bilaterally, the largest of which measures up to 8 x 6 mm (7 mm mean diameter) in the posterior right upper lobe (image 16 of series 407), and 8 x 5 mm in the lateral segment of the right middle lobe (image 36 of series 407). Most of these are solid in appearance. However, in the medial aspect of the inferior right upper lobe shortly above the minor fissure there is also a sub solid nodule that has a ground-glass attenuation component measuring 10 x 7 mm and a central solid component it measures 2 mm (images 26 of series 407 and image 63 of series 401). No acute consolidative airspace disease. No pleural effusions. Dependent areas of scarring or subsegmental atelectasis are noted in the lungs bilaterally.  Musculoskeletal/Soft Tissues: Compression fracture of superior endplate of 624THL with a small amount of paravertebral soft tissue thickening on the right side, likely subacute, with approximately 15-20% loss of anterior vertebral body height. There are no aggressive appearing lytic or blastic lesions noted in the visualized portions of the skeleton.  CTA ABDOMEN AND PELVIS FINDINGS  Hepatobiliary: 9 x 8 mm low attenuation lesion in the inferior aspect of segment 5 of the liver (image 197 of series 401) and tiny 3 mm low-attenuation lesion in segment 4A are both too small to definitively characterize, but are similar to prior studies, likely tiny cysts. The liver has a slightly shrunken appearance and nodular contour, suggestive of underlying cirrhosis. Tiny calcified granuloma posteriorly in the right lobe of the liver incidentally noted. No suspicious hepatic lesions are identified. No intra  or extrahepatic biliary ductal dilatation. Gallbladder is normal in appearance.  Pancreas: No pancreatic mass. No pancreatic ductal dilatation. No pancreatic or peripancreatic fluid or inflammatory changes.  Spleen: Unremarkable.  Adrenals/Urinary Tract: Exophytic 1.7 cm intermediate attenuation (38 HU) lesion in the lateral aspect of the lower pole of the right kidney is unchanged compared to numerous prior examinations, presumably a mildly proteinaceous cyst. Sub cm low-attenuation lesion in the lower pole of the left kidney is also unchanged, and although too small  to definitively characterize, these are likely tiny cysts. Bilateral adrenal glands are normal in appearance. No hydroureteronephrosis. Urinary bladder wall appears slightly thickened 6, without a definite focal mass.  Stomach/Bowel: Stomach is normal in appearance. No pathologic dilatation of small bowel or colon. Colonic wall thickening in the region of the cecum, best appreciated on axial image 223 of series 401 and coronal image 41 of series 4013.  Vascular/Lymphatic: Vascular findings and measurements pertinent to potential TAVR procedure, as detailed below. No aneurysm or dissection identified in the abdominal or pelvic vasculature. No lymphadenopathy noted in the abdomen or pelvis.  Reproductive: Fiducial markers adjacent to the prostate gland. Prostate gland and seminal vesicles are otherwise unremarkable in appearance.  Other: Ill-defined fatty tissue use in the low anatomic pelvis, potentially related to prior radiation therapy. No significant volume of ascites. No pneumoperitoneum.  Musculoskeletal: Well-defined sclerotic lesion measuring 8 mm in the left side of the L4 vertebral body is similar to prior study from 07/14/2015, likely a tiny bone island. There is also a mixed lucent and sclerotic lesion in the left side of the ileum (image 216 of series 401), which is unchanged dating back to  07/14/2015, and although indeterminate, favored to be benign, potentially an area of fibrous dysplasia.  VASCULAR MEASUREMENTS PERTINENT TO TAVR:  AORTA:  Minimal Aortic Diameter - 14 x 15 mm  Severity of Aortic Calcification - moderate  RIGHT PELVIS:  Right Common Iliac Artery -  Minimal Diameter - 10.0 x 8.7 mm  Tortuosity - moderate to severe  Calcification - mild  Right External Iliac Artery -  Minimal Diameter - 7.8 x 7.0 mm  Tortuosity - moderate to severe  Calcification - mild  Right Common Femoral Artery -  Minimal Diameter - 8.6 x 7.1 mm  Tortuosity - mild  Calcification - mild  LEFT PELVIS:  Left Common Iliac Artery -  Minimal Diameter - 9.4 x 9.2 mm  Tortuosity - moderate to severe  Calcification - mild  Left External Iliac Artery -  Minimal Diameter - 8.5 x 8.9 mm  Tortuosity - moderate to severe  Calcification - mild  Left Common Femoral Artery -  Minimal Diameter - 9.1 x 7.5 mm  Tortuosity - mild  Calcification - mild  Review of the MIP images confirms the above findings.  IMPRESSION: 1. Vascular findings and measurements pertinent to potential TAVR procedure, as detailed above. This patient appears to have suitable pelvic arterial access bilaterally, although common iliac and external iliac arteries are rather tortuous. 2. Severe thickening calcification of the aortic valve, compatible with the reported clinical history of severe aortic stenosis. 3. Colonic wall thickening in the region of the cecum, slightly mass-like in appearance. Correlation with nonemergent colonoscopy is recommended in the near future to exclude colonic neoplasm. 4. Multiple small pulmonary nodules in the lungs bilaterally, measuring up to 7 mm in the posterior aspect of the right upper lobe. The majority of these are solid, however, there is a sub solid nodule in the inferior right upper lobe which has a mean  diameter of 9 mm with a central 2 mm solid component. Non-contrast chest CT at 3-6 months is recommended. If nodules persist, subsequent management will be based upon the most suspicious nodule(s). This recommendation follows the consensus statement: Guidelines for Management of Incidental Pulmonary Nodules Detected on CT Images:From the Fleischner Society 2017; published online before print (10.1148/radiol.IJ:2314499). 5. Mild thickening of the urinary bladder, most evident near the bladder apex, favored to be related to  prior radiation therapy. No discrete bladder wall mass is identified at this time. 6. Mild pericardial thickening and calcification. No morphologic changes in the heart identified at this time to strongly suggest presence of constrictive physiology, but echocardiographic correlation is suggested. 7. Multiple borderline enlarged and minimally enlarged mediastinal and hilar lymph nodes. These are nonspecific. Attention at time of followup chest CT is recommended. 8. Additional incidental findings, as above.   Electronically Signed  By: Vinnie Langton M.D.  On: 01/25/2016 11:09   STS Calculation : risk of mortality 1.58% Morbidity or mortality 12.5% Long length of stay 5% Permanent stroke 1.6% Prolonged ventilation 6.6% Renal failure 2.8% Reoperation 7.5 Short length of stay 45%   Impression:  He has stage D severe symptomatic aortic stenosis with NYHA class II symptoms of chest discomfort and dizziness with moderate exertion. I have personally reviewed his echo and cath. He has a trileaflet aortic valve with severe calcification of the leaflets and restricted motion with a mean gradient of 34 mm Hg and a DI of 0.2. His cath shows a patent DES in the RCA and no other significant coronary disease. He is 80 years old and still trying to recover from a complicated appendectomy for perforated appendicitis requiring prolonged antibiotics. 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 I think he would have a slow and difficult recovery from open surgical AVR although his STS PROM is low. I think TAVR would be a better option for him.   The patient and his wife were counseled at length regarding treatment alternatives for management of severe symptomatic aortic stenosis. Alternative approaches such as conventional aortic valve replacement, transcatheter aortic valve replacement, and palliative medical therapy were compared and contrasted at length. The risks associated with conventional surgical aortic valve replacement were been discussed in detail, as were expectations for post-operative convalescence. Long-term prognosis with medical therapy was discussed. He would like to proceed with TAVR.  I have personally reviewed his gated cardiac CT and CTA of the chest, abdomen, and pelvis. This shows that he would be a candidate for a Sapien 3 valve via a transfemoral route.  We discussed complications that might develop including but not limited to risks of death, stroke, paravalvular leak, aortic dissection or other major vascular complications, aortic annulus rupture, device embolization, cardiac rupture or perforation, mitral regurgitation, acute myocardial infarction, arrhythmia, heart block or bradycardia requiring permanent pacemaker placement, congestive heart failure, respiratory failure, renal failure, pneumonia, infection, other late complications related to structural valve deterioration or migration, or other complications that might ultimately cause a temporary or permanent loss of functional independence or other long term morbidity. The patient provides full informed consent for the procedure as described and all questions were answered.       Plan:  Transfemoral TAVR on 02/15/2016    Gaye Pollack, MD 02/02/2016

## 2016-02-15 ENCOUNTER — Inpatient Hospital Stay (HOSPITAL_COMMUNITY): Payer: Medicare Other

## 2016-02-15 ENCOUNTER — Encounter (HOSPITAL_COMMUNITY): Payer: Self-pay | Admitting: *Deleted

## 2016-02-15 ENCOUNTER — Inpatient Hospital Stay (HOSPITAL_COMMUNITY): Payer: Medicare Other | Admitting: Anesthesiology

## 2016-02-15 ENCOUNTER — Other Ambulatory Visit: Payer: Self-pay

## 2016-02-15 ENCOUNTER — Encounter (HOSPITAL_COMMUNITY)
Admission: RE | Disposition: A | Discharge status: Home / Self Care | Payer: Self-pay | Source: Ambulatory Visit | Attending: Surgery

## 2016-02-15 ENCOUNTER — Ambulatory Visit (HOSPITAL_COMMUNITY)
Admission: RE | Admit: 2016-02-15 | Discharge: 2016-02-15 | Disposition: A | Discharge status: Home / Self Care | Payer: Medicare Other | Source: Ambulatory Visit | Attending: Cardiovascular Disease | Admitting: Cardiovascular Disease

## 2016-02-15 ENCOUNTER — Inpatient Hospital Stay (HOSPITAL_COMMUNITY)
Admission: RE | Admit: 2016-02-15 | Discharge: 2016-02-17 | DRG: 267 | Disposition: A | Discharge status: Home / Self Care | Payer: Medicare Other | Source: Ambulatory Visit | Attending: Surgery | Admitting: Surgery

## 2016-02-15 DIAGNOSIS — D696 Thrombocytopenia, unspecified: Secondary | ICD-10-CM | POA: Diagnosis not present

## 2016-02-15 DIAGNOSIS — I25119 Atherosclerotic heart disease of native coronary artery with unspecified angina pectoris: Secondary | ICD-10-CM | POA: Diagnosis present

## 2016-02-15 DIAGNOSIS — D62 Acute posthemorrhagic anemia: Secondary | ICD-10-CM | POA: Diagnosis not present

## 2016-02-15 DIAGNOSIS — E785 Hyperlipidemia, unspecified: Secondary | ICD-10-CM | POA: Diagnosis not present

## 2016-02-15 DIAGNOSIS — Z923 Personal history of irradiation: Secondary | ICD-10-CM

## 2016-02-15 DIAGNOSIS — K30 Functional dyspepsia: Secondary | ICD-10-CM | POA: Diagnosis not present

## 2016-02-15 DIAGNOSIS — I350 Nonrheumatic aortic (valve) stenosis: Principal | ICD-10-CM | POA: Diagnosis present

## 2016-02-15 DIAGNOSIS — R001 Bradycardia, unspecified: Secondary | ICD-10-CM | POA: Diagnosis present

## 2016-02-15 DIAGNOSIS — F329 Major depressive disorder, single episode, unspecified: Secondary | ICD-10-CM | POA: Diagnosis present

## 2016-02-15 DIAGNOSIS — K219 Gastro-esophageal reflux disease without esophagitis: Secondary | ICD-10-CM | POA: Diagnosis present

## 2016-02-15 DIAGNOSIS — I2510 Atherosclerotic heart disease of native coronary artery without angina pectoris: Secondary | ICD-10-CM | POA: Diagnosis not present

## 2016-02-15 DIAGNOSIS — Z808 Family history of malignant neoplasm of other organs or systems: Secondary | ICD-10-CM

## 2016-02-15 DIAGNOSIS — Z952 Presence of prosthetic heart valve: Secondary | ICD-10-CM

## 2016-02-15 DIAGNOSIS — Z87891 Personal history of nicotine dependence: Secondary | ICD-10-CM | POA: Diagnosis not present

## 2016-02-15 DIAGNOSIS — K627 Radiation proctitis: Secondary | ICD-10-CM | POA: Diagnosis present

## 2016-02-15 DIAGNOSIS — Z7982 Long term (current) use of aspirin: Secondary | ICD-10-CM

## 2016-02-15 DIAGNOSIS — Z955 Presence of coronary angioplasty implant and graft: Secondary | ICD-10-CM

## 2016-02-15 DIAGNOSIS — Z7902 Long term (current) use of antithrombotics/antiplatelets: Secondary | ICD-10-CM | POA: Diagnosis not present

## 2016-02-15 DIAGNOSIS — Z79899 Other long term (current) drug therapy: Secondary | ICD-10-CM

## 2016-02-15 DIAGNOSIS — Z006 Encounter for examination for normal comparison and control in clinical research program: Secondary | ICD-10-CM | POA: Diagnosis not present

## 2016-02-15 DIAGNOSIS — Z954 Presence of other heart-valve replacement: Secondary | ICD-10-CM | POA: Diagnosis not present

## 2016-02-15 DIAGNOSIS — Z452 Encounter for adjustment and management of vascular access device: Secondary | ICD-10-CM | POA: Diagnosis not present

## 2016-02-15 DIAGNOSIS — C61 Malignant neoplasm of prostate: Secondary | ICD-10-CM | POA: Diagnosis present

## 2016-02-15 DIAGNOSIS — I25810 Atherosclerosis of coronary artery bypass graft(s) without angina pectoris: Secondary | ICD-10-CM | POA: Diagnosis not present

## 2016-02-15 HISTORY — PX: TRANSCATHETER AORTIC VALVE REPLACEMENT, TRANSFEMORAL: SHX6400

## 2016-02-15 HISTORY — PX: TEE WITHOUT CARDIOVERSION: SHX5443

## 2016-02-15 LAB — CREATININE, SERUM: CREATININE: 0.76 mg/dL (ref 0.61–1.24)

## 2016-02-15 LAB — POCT I-STAT 3, ART BLOOD GAS (G3+)
ACID-BASE DEFICIT: 1 mmol/L (ref 0.0–2.0)
BICARBONATE: 26.8 meq/L — AB (ref 20.0–24.0)
Bicarbonate: 25.6 mEq/L — ABNORMAL HIGH (ref 20.0–24.0)
O2 SAT: 99 %
O2 Saturation: 96 %
PCO2 ART: 53.1 mmHg — AB (ref 35.0–45.0)
PH ART: 7.347 — AB (ref 7.350–7.450)
PO2 ART: 144 mmHg — AB (ref 80.0–100.0)
Patient temperature: 97.5
TCO2: 28 mmol/L (ref 0–100)
TCO2: 27 mmol/L (ref 0–100)
pCO2 arterial: 46.2 mmHg — ABNORMAL HIGH (ref 35.0–45.0)
pH, Arterial: 7.309 — ABNORMAL LOW (ref 7.350–7.450)
pO2, Arterial: 83 mmHg (ref 80.0–100.0)

## 2016-02-15 LAB — CBC
HCT: 32.5 % — ABNORMAL LOW (ref 39.0–52.0)
HCT: 36.6 % — ABNORMAL LOW (ref 39.0–52.0)
Hemoglobin: 10.5 g/dL — ABNORMAL LOW (ref 13.0–17.0)
Hemoglobin: 11.8 g/dL — ABNORMAL LOW (ref 13.0–17.0)
MCH: 29.8 pg (ref 26.0–34.0)
MCH: 29.7 pg (ref 26.0–34.0)
MCHC: 32.3 g/dL (ref 30.0–36.0)
MCHC: 32.2 g/dL (ref 30.0–36.0)
MCV: 92.3 fL (ref 78.0–100.0)
MCV: 92.2 fL (ref 78.0–100.0)
PLATELETS: 135 10*3/uL — AB (ref 150–400)
PLATELETS: 146 10*3/uL — AB (ref 150–400)
RBC: 3.52 MIL/uL — AB (ref 4.22–5.81)
RBC: 3.97 MIL/uL — AB (ref 4.22–5.81)
RDW: 14.4 % (ref 11.5–15.5)
RDW: 14.1 % (ref 11.5–15.5)
WBC: 4.7 10*3/uL (ref 4.0–10.5)
WBC: 7.6 10*3/uL (ref 4.0–10.5)

## 2016-02-15 LAB — TYPE AND SCREEN
ABO/RH(D): O POS
ANTIBODY SCREEN: NEGATIVE
UNIT DIVISION: 0
Unit division: 0

## 2016-02-15 LAB — APTT: aPTT: 41 seconds — ABNORMAL HIGH (ref 24–37)

## 2016-02-15 LAB — POCT I-STAT 4, (NA,K, GLUC, HGB,HCT)
Glucose, Bld: 105 mg/dL — ABNORMAL HIGH (ref 65–99)
Glucose, Bld: 101 mg/dL — ABNORMAL HIGH (ref 65–99)
HEMATOCRIT: 33 % — AB (ref 39.0–52.0)
HEMATOCRIT: 31 % — AB (ref 39.0–52.0)
Hemoglobin: 11.2 g/dL — ABNORMAL LOW (ref 13.0–17.0)
Hemoglobin: 10.5 g/dL — ABNORMAL LOW (ref 13.0–17.0)
POTASSIUM: 3.7 mmol/L (ref 3.5–5.1)
Potassium: 4 mmol/L (ref 3.5–5.1)
SODIUM: 139 mmol/L (ref 135–145)
Sodium: 138 mmol/L (ref 135–145)

## 2016-02-15 LAB — PROTIME-INR
INR: 1.4 (ref 0.00–1.49)
Prothrombin Time: 17.2 s — ABNORMAL HIGH (ref 11.6–15.2)

## 2016-02-15 LAB — MAGNESIUM: MAGNESIUM: 2.8 mg/dL — AB (ref 1.7–2.4)

## 2016-02-15 SURGERY — IMPLANTATION, AORTIC VALVE, TRANSCATHETER, FEMORAL APPROACH
Anesthesia: General | Site: Chest

## 2016-02-15 MED ORDER — ROCURONIUM BROMIDE 50 MG/5ML IV SOLN
INTRAVENOUS | Status: AC
  Filled 2016-02-15: qty 1

## 2016-02-15 MED ORDER — ALBUMIN HUMAN 5 % IV SOLN: 250.0000 mL | INTRAVENOUS | Status: DC | PRN

## 2016-02-15 MED ORDER — ACETAMINOPHEN 500 MG PO TABS
1000.0000 mg | ORAL_TABLET | Freq: Four times a day (QID) | ORAL | Status: DC
  Administered 2016-02-16: 1000 mg via ORAL
  Filled 2016-02-15: qty 2

## 2016-02-15 MED ORDER — ALBUTEROL SULFATE (2.5 MG/3ML) 0.083% IN NEBU
3.0000 mL | INHALATION_SOLUTION | Freq: Every day | RESPIRATORY_TRACT | Status: DC
  Administered 2016-02-15 – 2016-02-16 (×2): 3 mL via RESPIRATORY_TRACT
  Filled 2016-02-15 (×2): qty 3

## 2016-02-15 MED ORDER — PHENYLEPHRINE HCL 10 MG/ML IJ SOLN
0.0000 ug/min | INTRAVENOUS | Status: DC
  Filled 2016-02-15: qty 2

## 2016-02-15 MED ORDER — ONDANSETRON HCL 4 MG/2ML IJ SOLN
INTRAMUSCULAR | Status: AC
  Filled 2016-02-15: qty 2

## 2016-02-15 MED ORDER — PROPOFOL 10 MG/ML IV BOLUS
INTRAVENOUS | Status: AC
  Filled 2016-02-15: qty 20

## 2016-02-15 MED ORDER — BUPROPION HCL ER (SR) 100 MG PO TB12
100.0000 mg | ORAL_TABLET | Freq: Every day | ORAL | Status: DC
  Administered 2016-02-16 – 2016-02-17 (×2): 100 mg via ORAL
  Filled 2016-02-15 (×3): qty 1

## 2016-02-15 MED ORDER — ASPIRIN EC 325 MG PO TBEC
325.0000 mg | DELAYED_RELEASE_TABLET | Freq: Every day | ORAL | Status: DC
  Administered 2016-02-16: 325 mg via ORAL
  Filled 2016-02-15: qty 1

## 2016-02-15 MED ORDER — MORPHINE SULFATE (PF) 2 MG/ML IV SOLN
2.0000 mg | INTRAVENOUS | Status: DC | PRN
  Administered 2016-02-16: 4 mg via INTRAVENOUS
  Filled 2016-02-15: qty 2

## 2016-02-15 MED ORDER — POTASSIUM CHLORIDE 10 MEQ/50ML IV SOLN
10.0000 meq | INTRAVENOUS | Status: AC
  Administered 2016-02-15 (×3): 10 meq via INTRAVENOUS
  Filled 2016-02-15 (×3): qty 50

## 2016-02-15 MED ORDER — ROCURONIUM BROMIDE 100 MG/10ML IV SOLN
INTRAVENOUS | Status: DC | PRN
  Administered 2016-02-15: 50 mg via INTRAVENOUS

## 2016-02-15 MED ORDER — FLUOXETINE HCL 10 MG PO CAPS
10.0000 mg | ORAL_CAPSULE | Freq: Every day | ORAL | Status: DC
  Administered 2016-02-16 – 2016-02-17 (×2): 10 mg via ORAL
  Filled 2016-02-15 (×3): qty 1

## 2016-02-15 MED ORDER — LACTATED RINGERS IV SOLN
INTRAVENOUS | Status: DC
  Administered 2016-02-15: 50 mL/h via INTRAVENOUS

## 2016-02-15 MED ORDER — ASPIRIN 81 MG PO CHEW: 324.0000 mg | CHEWABLE_TABLET | Freq: Every day | ORAL | Status: DC

## 2016-02-15 MED ORDER — OXYCODONE HCL 5 MG PO TABS
5.0000 mg | ORAL_TABLET | ORAL | Status: DC | PRN
  Administered 2016-02-15 – 2016-02-16 (×2): 5 mg via ORAL
  Filled 2016-02-15 (×2): qty 1

## 2016-02-15 MED ORDER — FENTANYL CITRATE (PF) 100 MCG/2ML IJ SOLN
INTRAMUSCULAR | Status: AC
  Administered 2016-02-15: 100 ug
  Filled 2016-02-15: qty 2

## 2016-02-15 MED ORDER — MORPHINE SULFATE (PF) 2 MG/ML IV SOLN: 1.0000 mg | INTRAVENOUS | Status: DC | PRN

## 2016-02-15 MED ORDER — SODIUM CHLORIDE 0.9 % IV SOLN
INTRAVENOUS | Status: DC | PRN
  Administered 2016-02-15: 1500 mL

## 2016-02-15 MED ORDER — MAGNESIUM SULFATE 4 GM/100ML IV SOLN
4.0000 g | Freq: Once | INTRAVENOUS | Status: AC
  Administered 2016-02-15: 4 g via INTRAVENOUS
  Filled 2016-02-15: qty 100

## 2016-02-15 MED ORDER — PROPOFOL 10 MG/ML IV BOLUS
INTRAVENOUS | Status: DC | PRN
  Administered 2016-02-15: 120 mg via INTRAVENOUS

## 2016-02-15 MED ORDER — CLOPIDOGREL BISULFATE 75 MG PO TABS
75.0000 mg | ORAL_TABLET | Freq: Every day | ORAL | Status: DC
  Administered 2016-02-16 – 2016-02-17 (×2): 75 mg via ORAL
  Filled 2016-02-15 (×2): qty 1

## 2016-02-15 MED ORDER — EPHEDRINE SULFATE 50 MG/ML IJ SOLN
INTRAMUSCULAR | Status: DC | PRN
  Administered 2016-02-15: 10 mg via INTRAVENOUS

## 2016-02-15 MED ORDER — ACETAMINOPHEN 650 MG RE SUPP: 650.0000 mg | Freq: Once | RECTAL | Status: DC

## 2016-02-15 MED ORDER — INSULIN REGULAR HUMAN 100 UNIT/ML IJ SOLN
INTRAMUSCULAR | Status: DC
  Filled 2016-02-15: qty 2.5

## 2016-02-15 MED ORDER — PANTOPRAZOLE SODIUM 40 MG PO TBEC
40.0000 mg | DELAYED_RELEASE_TABLET | Freq: Every day | ORAL | Status: DC
  Administered 2016-02-16: 40 mg via ORAL
  Filled 2016-02-15: qty 1

## 2016-02-15 MED ORDER — 0.9 % SODIUM CHLORIDE (POUR BTL) OPTIME
TOPICAL | Status: DC | PRN
  Administered 2016-02-15: 4000 mL

## 2016-02-15 MED ORDER — PHENYLEPHRINE HCL 10 MG/ML IJ SOLN
INTRAMUSCULAR | Status: DC | PRN
  Administered 2016-02-15: 120 ug via INTRAVENOUS

## 2016-02-15 MED ORDER — CLOPIDOGREL BISULFATE 75 MG PO TABS: 75.0000 mg | ORAL_TABLET | Freq: Every day | ORAL | Status: DC

## 2016-02-15 MED ORDER — SUGAMMADEX SODIUM 200 MG/2ML IV SOLN
INTRAVENOUS | Status: AC
  Filled 2016-02-15: qty 2

## 2016-02-15 MED ORDER — IODIXANOL 320 MG/ML IV SOLN
INTRAVENOUS | Status: DC | PRN
  Administered 2016-02-15: 39.8 mL via INTRAVENOUS

## 2016-02-15 MED ORDER — SODIUM CHLORIDE 0.9 % IV SOLN
1.0000 mL/kg/h | INTRAVENOUS | Status: DC
  Administered 2016-02-15: 1 mL/kg/h via INTRAVENOUS

## 2016-02-15 MED ORDER — LACTATED RINGERS IV SOLN
INTRAVENOUS | Status: DC | PRN
  Administered 2016-02-15: 10:00:00 via INTRAVENOUS

## 2016-02-15 MED ORDER — ROSUVASTATIN CALCIUM 10 MG PO TABS
10.0000 mg | ORAL_TABLET | Freq: Every day | ORAL | Status: DC
  Administered 2016-02-16: 10 mg via ORAL
  Filled 2016-02-15 (×2): qty 1

## 2016-02-15 MED ORDER — ONDANSETRON HCL 4 MG/2ML IJ SOLN: 4.0000 mg | Freq: Four times a day (QID) | INTRAMUSCULAR | Status: DC | PRN

## 2016-02-15 MED ORDER — ACETAMINOPHEN 160 MG/5ML PO SOLN
1000.0000 mg | Freq: Four times a day (QID) | ORAL | Status: DC
Start: 2016-02-16 — End: 2016-02-16

## 2016-02-15 MED ORDER — VANCOMYCIN HCL IN DEXTROSE 1-5 GM/200ML-% IV SOLN
1000.0000 mg | Freq: Once | INTRAVENOUS | Status: AC
  Administered 2016-02-15: 1000 mg via INTRAVENOUS
  Filled 2016-02-15: qty 200

## 2016-02-15 MED ORDER — MIDAZOLAM HCL 2 MG/2ML IJ SOLN
INTRAMUSCULAR | Status: AC
  Filled 2016-02-15: qty 2

## 2016-02-15 MED ORDER — INSULIN REGULAR BOLUS VIA INFUSION
0.0000 [IU] | Freq: Three times a day (TID) | INTRAVENOUS | Status: DC
  Filled 2016-02-15: qty 10

## 2016-02-15 MED ORDER — METOCLOPRAMIDE HCL 5 MG/ML IJ SOLN
10.0000 mg | Freq: Four times a day (QID) | INTRAMUSCULAR | Status: AC
  Administered 2016-02-15 – 2016-02-16 (×4): 10 mg via INTRAVENOUS
  Filled 2016-02-15 (×3): qty 2

## 2016-02-15 MED ORDER — HEPARIN SODIUM (PORCINE) 1000 UNIT/ML IJ SOLN
INTRAMUSCULAR | Status: AC
  Filled 2016-02-15: qty 1

## 2016-02-15 MED ORDER — FAMOTIDINE IN NACL 20-0.9 MG/50ML-% IV SOLN
20.0000 mg | Freq: Two times a day (BID) | INTRAVENOUS | Status: DC
  Administered 2016-02-15: 20 mg via INTRAVENOUS

## 2016-02-15 MED ORDER — TRAMADOL HCL 50 MG PO TABS: 50.0000 mg | ORAL_TABLET | ORAL | Status: DC | PRN

## 2016-02-15 MED ORDER — LACTATED RINGERS IV SOLN
INTRAVENOUS | Status: DC | PRN
  Administered 2016-02-15: 11:00:00 via INTRAVENOUS

## 2016-02-15 MED ORDER — SUGAMMADEX SODIUM 200 MG/2ML IV SOLN
INTRAVENOUS | Status: DC | PRN
  Administered 2016-02-15: 130 mg via INTRAVENOUS

## 2016-02-15 MED ORDER — LACTATED RINGERS IV SOLN
500.0000 mL | Freq: Once | INTRAVENOUS | Status: AC | PRN
  Administered 2016-02-15: 1000 mL via INTRAVENOUS

## 2016-02-15 MED ORDER — NOREPINEPHRINE BITARTRATE 1 MG/ML IV SOLN
4000.0000 ug | INTRAVENOUS | Status: DC | PRN
  Administered 2016-02-15: 3 ug/min via INTRAVENOUS

## 2016-02-15 MED ORDER — CHLORHEXIDINE GLUCONATE 0.12 % MT SOLN
15.0000 mL | OROMUCOSAL | Status: AC
  Administered 2016-02-15: 15 mL via OROMUCOSAL

## 2016-02-15 MED ORDER — NITROGLYCERIN IN D5W 200-5 MCG/ML-% IV SOLN
0.0000 ug/min | INTRAVENOUS | Status: DC
  Administered 2016-02-15: 15 ug/min via INTRAVENOUS

## 2016-02-15 MED ORDER — ONDANSETRON HCL 4 MG/2ML IJ SOLN
INTRAMUSCULAR | Status: DC | PRN
  Administered 2016-02-15: 4 mg via INTRAVENOUS

## 2016-02-15 MED ORDER — HEPARIN SODIUM (PORCINE) 1000 UNIT/ML IJ SOLN
INTRAMUSCULAR | Status: DC | PRN
  Administered 2016-02-15: 8000 [IU] via INTRAVENOUS

## 2016-02-15 MED ORDER — PROTAMINE SULFATE 10 MG/ML IV SOLN
INTRAVENOUS | Status: DC | PRN
  Administered 2016-02-15: 80 mg via INTRAVENOUS

## 2016-02-15 MED ORDER — REMIFENTANIL HCL 1 MG IV SOLR
INTRAVENOUS | Status: DC | PRN
  Administered 2016-02-15: .5 ug/kg/min via INTRAVENOUS

## 2016-02-15 MED ORDER — ESMOLOL HCL 100 MG/10ML IV SOLN
INTRAVENOUS | Status: DC | PRN
  Administered 2016-02-15: 20 mg via INTRAVENOUS
  Administered 2016-02-15: 30 mg via INTRAVENOUS

## 2016-02-15 MED ORDER — DEXTROSE 5 % IV SOLN
1.5000 g | Freq: Two times a day (BID) | INTRAVENOUS | Status: DC
  Administered 2016-02-15 – 2016-02-16 (×2): 1.5 g via INTRAVENOUS
  Filled 2016-02-15 (×3): qty 1.5

## 2016-02-15 MED ORDER — ACETAMINOPHEN 160 MG/5ML PO SOLN: 650.0000 mg | Freq: Once | ORAL | Status: DC

## 2016-02-15 MED ORDER — ALBUMIN HUMAN 5 % IV SOLN
INTRAVENOUS | Status: DC | PRN
  Administered 2016-02-15 (×2): via INTRAVENOUS

## 2016-02-15 MED FILL — Potassium Chloride Inj 2 mEq/ML: INTRAVENOUS | Qty: 40 | Status: AC

## 2016-02-15 MED FILL — Heparin Sodium (Porcine) Inj 1000 Unit/ML: INTRAMUSCULAR | Qty: 30 | Status: AC

## 2016-02-15 MED FILL — Magnesium Sulfate Inj 50%: INTRAMUSCULAR | Qty: 10 | Status: AC

## 2016-02-15 SURGICAL SUPPLY — 113 items
ADAPTER UNIV SWAN GANZ BIP (ADAPTER) IMPLANT
ADAPTER UNV SWAN GANZ BIP (ADAPTER) ×2
ADH SKN CLS APL DERMABOND .7 (GAUZE/BANDAGES/DRESSINGS) ×2
ADPR CATH UNV NS SG CATH (ADAPTER) ×2
ATTRACTOMAT 16X20 MAGNETIC DRP (DRAPES) IMPLANT
BAG BANDED W/RUBBER/TAPE 36X54 (MISCELLANEOUS) ×4 IMPLANT
BAG DECANTER FOR FLEXI CONT (MISCELLANEOUS) IMPLANT
BAG EQP BAND 135X91 W/RBR TAPE (MISCELLANEOUS) ×2
BAG SNAP BAND KOVER 36X36 (MISCELLANEOUS) ×8 IMPLANT
BLADE 10 SAFETY STRL DISP (BLADE) ×4 IMPLANT
BLADE STERNUM SYSTEM 6 (BLADE) ×4 IMPLANT
BLADE SURG ROTATE 9660 (MISCELLANEOUS) IMPLANT
CABLE PACING FASLOC BIEGE (MISCELLANEOUS) ×2 IMPLANT
CABLE PACING FASLOC BLUE (MISCELLANEOUS) ×4 IMPLANT
CANISTER SUCTION 2500CC (MISCELLANEOUS) IMPLANT
CANNULA FEM VENOUS REMOTE 22FR (CANNULA) IMPLANT
CANNULA OPTISITE PERFUSION 16F (CANNULA) IMPLANT
CANNULA OPTISITE PERFUSION 18F (CANNULA) IMPLANT
CATH DIAG EXPO 6F AL2 (CATHETERS) ×2 IMPLANT
CATH DIAG EXPO 6F VENT PIG 145 (CATHETERS) ×4 IMPLANT
CATH S G BIP PACING (SET/KITS/TRAYS/PACK) ×6 IMPLANT
CLIP TI MEDIUM 24 (CLIP) ×4 IMPLANT
CLIP TI WIDE RED SMALL 24 (CLIP) ×4 IMPLANT
CONT SPEC 4OZ CLIKSEAL STRL BL (MISCELLANEOUS) ×6 IMPLANT
COVER DOME SNAP 22 D (MISCELLANEOUS) ×4 IMPLANT
COVER MAYO STAND STRL (DRAPES) ×4 IMPLANT
COVER TABLE BACK 60X90 (DRAPES) ×4 IMPLANT
CRADLE DONUT ADULT HEAD (MISCELLANEOUS) ×4 IMPLANT
DERMABOND ADVANCED (GAUZE/BANDAGES/DRESSINGS) ×2
DERMABOND ADVANCED .7 DNX12 (GAUZE/BANDAGES/DRESSINGS) ×2 IMPLANT
DEVICE CLOSURE PERCLS PRGLD 6F (VASCULAR PRODUCTS) IMPLANT
DRAPE INCISE IOBAN 66X45 STRL (DRAPES) IMPLANT
DRAPE SLUSH MACHINE 52X66 (DRAPES) ×4 IMPLANT
DRAPE TABLE COVER HEAVY DUTY (DRAPES) ×4 IMPLANT
DRSG TEGADERM 4X4.75 (GAUZE/BANDAGES/DRESSINGS) ×4 IMPLANT
ELECT REM PT RETURN 9FT ADLT (ELECTROSURGICAL) ×8
ELECTRODE REM PT RTRN 9FT ADLT (ELECTROSURGICAL) ×4 IMPLANT
FELT TEFLON 6X6 (MISCELLANEOUS) ×4 IMPLANT
FEMORAL VENOUS CANN RAP (CANNULA) IMPLANT
GAUZE SPONGE 4X4 12PLY STRL (GAUZE/BANDAGES/DRESSINGS) ×4 IMPLANT
GLOVE BIO SURGEON STRL SZ 6.5 (GLOVE) ×2 IMPLANT
GLOVE BIO SURGEON STRL SZ7.5 (GLOVE) ×4 IMPLANT
GLOVE BIO SURGEON STRL SZ8 (GLOVE) ×8 IMPLANT
GLOVE BIO SURGEONS STRL SZ 6.5 (GLOVE) ×2
GLOVE BIOGEL PI IND STRL 6 (GLOVE) IMPLANT
GLOVE BIOGEL PI IND STRL 6.5 (GLOVE) IMPLANT
GLOVE BIOGEL PI INDICATOR 6 (GLOVE) ×4
GLOVE BIOGEL PI INDICATOR 6.5 (GLOVE) ×10
GLOVE ECLIPSE 7.5 STRL STRAW (GLOVE) ×4 IMPLANT
GLOVE ECLIPSE 8.0 STRL XLNG CF (GLOVE) ×4 IMPLANT
GLOVE EUDERMIC 7 POWDERFREE (GLOVE) ×4 IMPLANT
GLOVE ORTHO TXT STRL SZ7.5 (GLOVE) ×4 IMPLANT
GOWN STRL REUS W/ TWL LRG LVL3 (GOWN DISPOSABLE) ×6 IMPLANT
GOWN STRL REUS W/ TWL XL LVL3 (GOWN DISPOSABLE) ×12 IMPLANT
GOWN STRL REUS W/TWL LRG LVL3 (GOWN DISPOSABLE) ×12
GOWN STRL REUS W/TWL XL LVL3 (GOWN DISPOSABLE) ×24
GUIDEWIRE SAF TJ AMPL .035X180 (WIRE) ×4 IMPLANT
GUIDEWIRE SAFE TJ AMPLATZ EXST (WIRE) ×2 IMPLANT
GUIDEWIRE STRAIGHT .035 260CM (WIRE) ×2 IMPLANT
INSERT FOGARTY 61MM (MISCELLANEOUS) ×4 IMPLANT
INSERT FOGARTY SM (MISCELLANEOUS) ×8 IMPLANT
INSERT FOGARTY XLG (MISCELLANEOUS) IMPLANT
KIT BASIN OR (CUSTOM PROCEDURE TRAY) ×4 IMPLANT
KIT DILATOR VASC 18G NDL (KITS) IMPLANT
KIT HEART LEFT (KITS) ×2 IMPLANT
KIT ROOM TURNOVER OR (KITS) ×4 IMPLANT
KIT SUCTION CATH 14FR (SUCTIONS) ×8 IMPLANT
NDL PERC 18GX7CM (NEEDLE) ×2 IMPLANT
NEEDLE PERC 18GX7CM (NEEDLE) ×8 IMPLANT
NS IRRIG 1000ML POUR BTL (IV SOLUTION) ×12 IMPLANT
PACK AORTA (CUSTOM PROCEDURE TRAY) ×4 IMPLANT
PAD ARMBOARD 7.5X6 YLW CONV (MISCELLANEOUS) ×8 IMPLANT
PAD ELECT DEFIB RADIOL ZOLL (MISCELLANEOUS) ×4 IMPLANT
PATCH TACHOSII LRG 9.5X4.8 (VASCULAR PRODUCTS) IMPLANT
PERCLOSE PROGLIDE 6F (VASCULAR PRODUCTS) ×8
SET MICROPUNCTURE 5F STIFF (MISCELLANEOUS) ×2 IMPLANT
SHEATH AVANTI 11CM 8FR (MISCELLANEOUS) ×2 IMPLANT
SHEATH PINNACLE 6F 10CM (SHEATH) ×2 IMPLANT
SLEEVE REPOSITIONING LENGTH 30 (MISCELLANEOUS) ×2 IMPLANT
SPONGE LAP 4X18 X RAY DECT (DISPOSABLE) ×4 IMPLANT
STOPCOCK 4 WAY LG BORE MALE ST (IV SETS) ×2 IMPLANT
STOPCOCK MORSE 400PSI 3WAY (MISCELLANEOUS) ×6 IMPLANT
SUT ETHIBOND X763 2 0 SH 1 (SUTURE) ×4 IMPLANT
SUT GORETEX CV 4 TH 22 36 (SUTURE) ×4 IMPLANT
SUT GORETEX CV4 TH-18 (SUTURE) ×12 IMPLANT
SUT GORETEX TH-18 36 INCH (SUTURE) ×8 IMPLANT
SUT MNCRL AB 3-0 PS2 18 (SUTURE) ×4 IMPLANT
SUT PROLENE 3 0 SH1 36 (SUTURE) IMPLANT
SUT PROLENE 4 0 RB 1 (SUTURE) ×4
SUT PROLENE 4-0 RB1 .5 CRCL 36 (SUTURE) ×2 IMPLANT
SUT PROLENE 5 0 C 1 36 (SUTURE) ×8 IMPLANT
SUT PROLENE 6 0 C 1 30 (SUTURE) ×8 IMPLANT
SUT SILK  1 MH (SUTURE) ×2
SUT SILK 1 MH (SUTURE) ×2 IMPLANT
SUT SILK 2 0 SH CR/8 (SUTURE) IMPLANT
SUT VIC AB 2-0 CT1 27 (SUTURE) ×4
SUT VIC AB 2-0 CT1 TAPERPNT 27 (SUTURE) ×2 IMPLANT
SUT VIC AB 2-0 CTX 36 (SUTURE) IMPLANT
SUT VIC AB 3-0 SH 8-18 (SUTURE) ×8 IMPLANT
SYR 30ML LL (SYRINGE) ×10 IMPLANT
SYR 50ML LL SCALE MARK (SYRINGE) ×4 IMPLANT
SYRINGE 10CC LL (SYRINGE) ×8 IMPLANT
TOWEL OR 17X26 10 PK STRL BLUE (TOWEL DISPOSABLE) ×8 IMPLANT
TRANSDUCER W/STOPCOCK (MISCELLANEOUS) ×4 IMPLANT
TRAY FOLEY IC TEMP SENS 14FR (CATHETERS) ×4 IMPLANT
TUBE SUCT INTRACARD DLP 20F (MISCELLANEOUS) IMPLANT
TUBING ART PRESS 72  MALE/FEM (TUBING) ×2
TUBING ART PRESS 72 MALE/FEM (TUBING) IMPLANT
TUBING HIGH PRESSURE 120CM (CONNECTOR) ×4 IMPLANT
VALVE HEART TRANSCATH SZ3 26MM (Prosthesis & Implant Heart) ×2 IMPLANT
WIRE AMPLATZ SS-J .035X180CM (WIRE) ×2 IMPLANT
WIRE BENTSON .035X145CM (WIRE) ×2 IMPLANT
WIRE J 3MM .035X145CM (WIRE) ×2 IMPLANT

## 2016-02-15 NOTE — Anesthesia Preprocedure Evaluation (Signed)
Anesthesia Evaluation  Patient identified by MRN, date of birth, ID band Patient awake    Reviewed: Allergy & Precautions, NPO status , Patient's Chart, lab work & pertinent test results  Airway Mallampati: II   Neck ROM: full    Dental   Pulmonary former smoker,    breath sounds clear to auscultation       Cardiovascular + angina + CAD  + Valvular Problems/Murmurs AS  Rhythm:regular Rate:Normal     Neuro/Psych Depression    GI/Hepatic GERD  ,  Endo/Other    Renal/GU      Musculoskeletal   Abdominal   Peds  Hematology   Anesthesia Other Findings   Reproductive/Obstetrics                             Anesthesia Physical Anesthesia Plan  ASA: IV  Anesthesia Plan: General   Post-op Pain Management:    Induction: Intravenous  Airway Management Planned: Oral ETT  Additional Equipment: Arterial line, CVP, PA Cath, TEE and Ultrasound Guidance Line Placement  Intra-op Plan:   Post-operative Plan: Possible Post-op intubation/ventilation  Informed Consent: I have reviewed the patients History and Physical, chart, labs and discussed the procedure including the risks, benefits and alternatives for the proposed anesthesia with the patient or authorized representative who has indicated his/her understanding and acceptance.     Plan Discussed with: CRNA, Anesthesiologist and Surgeon  Anesthesia Plan Comments:         Anesthesia Quick Evaluation

## 2016-02-15 NOTE — Progress Notes (Signed)
TCTS BRIEF SICU PROGRESS NOTE  Day of Surgery  S/P Procedure(s) (LRB): TRANSCATHETER AORTIC VALVE REPLACEMENT, TRANSFEMORAL (N/A) TRANSESOPHAGEAL ECHOCARDIOGRAM (TEE) (N/A)   Doing very well Maintaining NSR w/ stable BP Breathing comfortably w/ O2 sats 100% on room air UOP excellent Labs okay  Plan: Continue routine post TAVR  Rexene Alberts, MD 02/15/2016 9:11 PM

## 2016-02-15 NOTE — Op Note (Signed)
[facility]   MULTIDISCIPLINARY HEART VALVE TEAM   TAVR OPERATIVE NOTE   Date of Procedure:  02/15/2016  Preoperative Diagnosis: Severe Aortic Stenosis   Postoperative Diagnosis: Same   Procedure:    Transcatheter Aortic Valve Replacement - Percutaneous Right Transfemoral Approach  Edwards Sapien 3 THV (size 26 mm, model # 9600TFX, serial # [id number])   Co-Surgeons:  Gaye Pollack, MD and [name], MD     Anesthesiologist:  Albertha Ghee, MD  Echocardiographer:  Loralie Champagne, MD  Pre-operative Echo Findings:   severe aortic stenosis   normal left ventricular systolic function   Post-operative Echo Findings:  trace paravalvular leak  normal left ventricular systolic function    BRIEF CLINICAL NOTE AND INDICATIONS FOR SURGERY  The patient is a 80 year old gentleman with a history of coronary artery disease and aortic stenosis who presented in 03/2015 with exertional angina and moderate AS by echo. He underwent cath on 04/21/2015 showing moderate coronary disease and moderate AS and medical therapy was recommended. He continued having angina and repeat cath in August 2016 showed a 60% distal RCA stenosis with an FFR of 0.69. This was treated with a DES. There was still moderate AS with a mean gradient of 35.6 mm Hg and a peak of 41. AVA was 1.21 cm2. He says that his symptoms did not improve with PCI. He ha a repeat cath four days later that showed a patent stent and no new source for angina. He continued to have some chest discomfort with exertion that he describes as an aching in his chest. He denies shortness of breath and orthopnea. He has had some dizziness. A repeat echo was done on 11/01/2015 showing a severely calcified aortic valve with a mean gradient of 34 mm Hg and a DI of 0.2. His EF was 55-60%. He was admitted in January 2017 with a perforated appendicitis and has had a slow recovery. He was subsequently admitted in early February with chest  pain and had negative enzymes. He was seen by Dr. Burt Knack for consideration of TAVR and a repeat cath on 01/17/2016 showed a patent RCA stent and a heavily calcified AV with a mean gradient of 36 mm Hg.  During the course of the patient's preoperative work up they have been evaluated comprehensively by a multidisciplinary team of specialists coordinated through the [facility] in the [facility].  They have been demonstrated to suffer from symptomatic severe aortic stenosis as noted above. The patient has been counseled extensively as to the relative risks and benefits of all options for the treatment of severe aortic stenosis including long term medical therapy, conventional surgery for aortic valve replacement, and transcatheter aortic valve replacement.  The patient has been independently evaluated by two cardiac surgeons including myself and Dr. [name], and they are felt to be at intermediate risk for conventional surgical aortic valve replacement due to his advanced age, recent perforated appendicitis with prolonged postop course and weight loss.  Based upon review of all of the patient's preoperative diagnostic tests they are felt to be candidate for transcatheter aortic valve replacement using the transfemoral approach as an alternative to conventional surgery.    Following the decision to proceed with transcatheter aortic valve replacement, a discussion has been held regarding what types of management strategies would be attempted intraoperatively in the event of life-threatening complications, including whether or not the patient would be considered a candidate for the use of cardiopulmonary  bypass and/or conversion to open sternotomy for attempted surgical intervention.  The patient has been advised of a variety of complications that might develop peculiar to this approach including but not limited to risks of death, stroke, paravalvular leak, aortic  dissection or other major vascular complications, aortic annulus rupture, device embolization, cardiac rupture or perforation, acute myocardial infarction, arrhythmia, heart block or bradycardia requiring permanent pacemaker placement, congestive heart failure, respiratory failure, renal failure, pneumonia, infection, other late complications related to structural valve deterioration or migration, or other complications that might ultimately cause a temporary or permanent loss of functional independence or other long term morbidity.  The patient provides full informed consent for the procedure as described and all questions were answered preoperatively.    DETAILS OF THE OPERATIVE PROCEDURE  PREPARATION:    The patient is brought to the operating room on the above mentioned date and central monitoring was established by the anesthesia team including placement of Swan-Ganz catheter and radial arterial line. The patient is placed in the supine position on the operating table.  Intravenous antibiotics are administered.  General endotracheal anesthesia is induced uneventfully.  A Foley catheter is placed.  Baseline transesophageal echocardiogram was performed. The patient's chest, abdomen, both groins, and both lower extremities are prepared and draped in a sterile manner. A time out procedure is performed.   Peripheral and transfemoral access was performed by Dr. Burt Knack and will be dictated in his note. Balloon aortic valvuloplasty was not performed.   TRANSCATHETER HEART VALVE DEPLOYMENT:   An Edwards Sapien 3 transcatheter heart valve (size 26 mm, model #9600TFX, serial [id number]) was prepared and crimped per manufacturer's guidelines, and the proper orientation of the valve is confirmed on the [employer] delivery system. The valve was advanced through the introducer sheath using normal technique until in an appropriate position in the abdominal aorta beyond the sheath tip. The balloon was  then retracted and using the fine-tuning wheel was centered on the valve. The valve was then advanced across the aortic arch using appropriate flexion of the catheter. The valve was carefully positioned across the aortic valve annulus. The Commander catheter was retracted using normal technique. Once final position of the valve has been confirmed by angiographic assessment, the valve is deployed while temporarily holding ventilation and during rapid ventricular pacing to maintain systolic blood pressure < 50 mmHg and pulse pressure < 10 mmHg. The balloon inflation is held for >3 seconds after reaching full deployment volume. Once the balloon has fully deflated the balloon is retracted into the ascending aorta and valve function is assessed using echocardiography. There is felt to be trivial posterior paravalvular leak and no central aortic insufficiency.  The patient's hemodynamic recovery following valve deployment is good.  The deployment balloon and guidewire are both removed. Echo demostrated acceptable post-procedural gradients, stable mitral valve function, and no central aortic insufficiency.   PROCEDURE COMPLETION:   The sheath was removed and femoral artery closure performed by Dr Burt Knack. Please see his separate report for details.  Protamine was administered once femoral arterial repair was complete. The temporary pacemaker, pigtail catheters and femoral sheaths were removed with manual pressure used for hemostasis.   The patient tolerated the procedure well and is transported to the surgical intensive care in stable condition. There were no immediate intraoperative complications. All sponge instrument and needle counts are verified correct at completion of the operation.   No blood products were administered during the operation.  The patient received a total of 40 mL of  intravenous contrast during the procedure.   Gaye Pollack, MD 02/15/2016 4:59 PM

## 2016-02-15 NOTE — Progress Notes (Signed)
EKG CRITICAL VALUE     12 lead EKG performed.  Critical value noted. Gerald Stabs, RN notified.   Dory Demont L, CCT 02/15/2016 1:36 PM

## 2016-02-15 NOTE — Transfer of Care (Signed)
Immediate Anesthesia Transfer of Care Note  Patient: Corey Oneill  Procedure(s) Performed: Procedure(s): TRANSCATHETER AORTIC VALVE REPLACEMENT, TRANSFEMORAL (N/A) TRANSESOPHAGEAL ECHOCARDIOGRAM (TEE) (N/A)  Patient Location: PACU  Anesthesia Type:General  Level of Consciousness: awake, alert , oriented and patient cooperative  Airway & Oxygen Therapy: Patient Spontanous Breathing and Patient connected to nasal cannula oxygen  Post-op Assessment: Report given to RN, Post -op Vital signs reviewed and stable and Patient moving all extremities  Post vital signs: Reviewed and stable  Last Vitals:  Filed Vitals:   02/15/16 1005 02/15/16 1010  BP: 121/65 126/68  Pulse: 77 74  Temp:    Resp: 15 15    Complications: No apparent anesthesia complications

## 2016-02-15 NOTE — Brief Op Note (Signed)
02/15/2016  12:14 PM  PATIENT:  Corey Oneill  80 y.o. male  PRE-OPERATIVE DIAGNOSIS:  SEVERE AS  POST-OPERATIVE DIAGNOSIS:  SEVERE AS  PROCEDURE:  Procedure(s): TRANSCATHETER AORTIC VALVE REPLACEMENT, RIGHT TRANSFEMORAL TRANSESOPHAGEAL ECHOCARDIOGRAM   SURGEON:  Surgeon(s) and Role:    * [name], MD - Primary    * Gaye Pollack, MD - Co-Surgeon    PHYSICIAN ASSISTANT: none     ANESTHESIA:   general  EBL:  Total I/O In: 500 [IV Piggyback:500] Out: -   BLOOD ADMINISTERED:none  DRAINS: none   LOCAL MEDICATIONS USED:  NONE  SPECIMEN:  No Specimen  DISPOSITION OF SPECIMEN:  N/A  COUNTS:  YES  TOURNIQUET:  * No tourniquets in log *  DICTATION: .Note written in EPIC  PLAN OF CARE: Admit to inpatient   PATIENT DISPOSITION:  ICU - extubated and stable.   Delay start of Pharmacological VTE agent (>24hrs) due to surgical blood loss or risk of bleeding: yes

## 2016-02-15 NOTE — Anesthesia Procedure Notes (Signed)
Procedure Name: Intubation Date/Time: 02/15/2016 11:01 AM Performed by: Greggory Stallion, Marce Charlesworth L Pre-anesthesia Checklist: Patient identified, Emergency Drugs available, Suction available and Patient being monitored Patient Re-evaluated:Patient Re-evaluated prior to inductionOxygen Delivery Method: Circle system utilized Preoxygenation: Pre-oxygenation with 100% oxygen Intubation Type: IV induction Ventilation: Mask ventilation without difficulty Laryngoscope Size: Glidescope and 4 Grade View: Grade II Tube type: Oral Tube size: 7.5 mm Number of attempts: 1 Airway Equipment and Method: Stylet and Video-laryngoscopy Placement Confirmation: ETT inserted through vocal cords under direct vision,  breath sounds checked- equal and bilateral and CO2 detector Secured at: 22 cm Tube secured with: Tape Dental Injury: Teeth and Oropharynx as per pre-operative assessment

## 2016-02-15 NOTE — Progress Notes (Signed)
  Echocardiogram Echocardiogram Transesophageal has been performed.  [name] 02/15/2016, 12:07 PM

## 2016-02-15 NOTE — Interval H&P Note (Signed)
History and Physical Interval Note:  02/15/2016 5:42 AM  Corey Oneill  has presented today for surgery, with the diagnosis of SEVERE AS  The various methods of treatment have been discussed with the patient and family. After consideration of risks, benefits and other options for treatment, the patient has consented to  Procedure(s): TRANSCATHETER AORTIC VALVE REPLACEMENT, TRANSFEMORAL (N/A) TRANSESOPHAGEAL ECHOCARDIOGRAM (TEE) (N/A) as a surgical intervention .  The patient's history has been reviewed, patient examined, no change in status, stable for surgery.  I have reviewed the patient's chart and labs.  Questions were answered to the patient's satisfaction.     Gaye Pollack

## 2016-02-15 NOTE — Progress Notes (Signed)
PM Round:  Pt doing very well. Hemodynamics stable. On 5 mcg NTG for BP, but weaning off.  Heart rhythm stable in sinus.   Plan: DC foley, DC art line, mobilize

## 2016-02-15 NOTE — Op Note (Signed)
[facility]   MULTIDISCIPLINARY HEART VALVE TEAM   TAVR OPERATIVE NOTE Date of Procedure:  02/15/2016  Preoperative Diagnosis: Severe Aortic Stenosis   Postoperative Diagnosis: Same   Procedure:   Transcatheter Aortic Valve Replacement - Percutaneous Transfemoral Approach  Edwards Sapien 3 THV (size 26 mm, model # 9600TFX, serial # [id number])   Co-Surgeons:  Gilford Raid, MD and [name], MD  Assistants:   Darylene Price, MD  Anesthesiologist:  Albertha Ghee, MD  Echocardiographer:  Loralie Champagne, MD  Pre-operative Echo Findings:  Severe aortic stenosis  Normal left ventricular systolic function  Post-operative Echo Findings:  Trace paravalvular leak  Normal (unchanged) left ventricular systolic function    BRIEF CLINICAL NOTE AND INDICATIONS FOR SURGERY  The patient is a 80 year old gentleman with a history of coronary artery disease and aortic stenosis who presented in 03/2015 with exertional angina and moderate AS by echo. He underwent cath on 04/21/2015 showing moderate coronary disease and moderate AS and medical therapy was recommended. He continued having angina and repeat cath in August 2016 showed a 60% distal RCA stenosis with an FFR of 0.69. This was treated with a DES. There was still moderate AS with a mean gradient of 35.6 mm Hg and a peak of 41. AVA was 1.21 cm2. He says that his symptoms did not improve with PCI. He ha a repeat cath four days later that showed a patent stent and no new source for angina. He continued to have some chest discomfort with exertion that he describes as an aching in his chest. He denies shortness of breath and orthopnea. He has had some dizziness. A repeat echo was done on 11/01/2015 showing a severely calcified aortic valve with a mean gradient of 34 mm Hg and a DI of 0.2. His EF was 55-60%. He was admitted in January 2017 with a perforated appendicitis and has had a slow recovery. He was subsequently admitted in early  February with chest pain and had negative enzymes. He was referred for consideration of TAVR and a repeat cath on 01/17/2016 showed a patent RCA stent and a heavily calcified AV with a mean gradient of 36 mm Hg.  During the course of the patient's preoperative work up they have been evaluated comprehensively by a multidisciplinary team of specialists coordinated through the [facility] in the [facility].  They have been demonstrated to suffer from symptomatic severe aortic stenosis as noted above. The patient has been counseled extensively as to the relative risks and benefits of all options for the treatment of severe aortic stenosis including long term medical therapy, conventional surgery for aortic valve replacement, and transcatheter aortic valve replacement.  The patient has been independently evaluated by two cardiac surgeons including Dr. [name] and Dr. Cyndia Bent, and they are felt to be at moderate risk for conventional surgical aortic valve replacement based upon comorbidities including unexplained weight loss, recent appendicitis with prolonged recovery from surgery, radiation proctitis with episodic bleeding.   Based upon review of all of the patient's preoperative diagnostic tests they are felt to be candidate for transcatheter aortic valve replacement using the transfemoral approach as an alternative to high risk conventional surgery.    Following the decision to proceed with transcatheter aortic valve replacement, a discussion has been held regarding what types of management strategies would be attempted intraoperatively in the event of life-threatening complications, including whether or not the patient would be considered a candidate for the use  of cardiopulmonary bypass and/or conversion to open sternotomy for attempted surgical intervention.  The patient has been advised of a variety of complications that might develop peculiar to this approach  including but not limited to risks of death, stroke, paravalvular leak, aortic dissection or other major vascular complications, aortic annulus rupture, device embolization, cardiac rupture or perforation, acute myocardial infarction, arrhythmia, heart block or bradycardia requiring permanent pacemaker placement, congestive heart failure, respiratory failure, renal failure, pneumonia, infection, other late complications related to structural valve deterioration or migration, or other complications that might ultimately cause a temporary or permanent loss of functional independence or other long term morbidity.  The patient provides full informed consent for the procedure as described and all questions were answered preoperatively.    DETAILS OF THE OPERATIVE PROCEDURE  PREPARATION:    The patient is brought to the operating room on the above mentioned date and central monitoring was established by the anesthesia team including placement of Swan-Ganz catheter and radial arterial line. The patient is placed in the supine position on the operating table.  Intravenous antibiotics are administered. General endotracheal anesthesia is induced uneventfully. A Foley catheter is placed.  Baseline transesophageal echocardiogram was performed. The patient's chest, abdomen, both groins, and both lower extremities are prepared and draped in a sterile manner. A time out procedure is performed.   PERIPHERAL ACCESS:    Using the modified Seldinger technique, femoral arterial and venous access was obtained with placement of 6 Fr sheaths on the left side.  A pigtail diagnostic catheter was passed through the left femoral arterial sheath under fluoroscopic guidance into the aortic root.  A temporary transvenous pacemaker catheter was passed through the left femoral venous sheath under fluoroscopic guidance into the right ventricle.  The pacemaker was tested to ensure stable lead placement and pacemaker capture. Aortic  root angiography was performed in order to determine the optimal angiographic angle for valve deployment.   TRANSFEMORAL ACCESS:  A micropuncture technique is used to access the right femoral artery under fluoroscopic guidance.   2 Perclose devices are deployed at 10' and 2' positions to 'PreClose' the femoral artery. An 8 French sheath is placed and then an Amplatz Superstiff wire is advanced through the sheath. This is changed out for a 14 French transfemoral E-Sheath after progressively dilating over the Superstiff wire.  An AL-2 catheter was used to direct a straight-tip exchange length wire across the native aortic valve into the left ventricle. This was exchanged out for a pigtail catheter and position was confirmed in the LV apex. Simultaneous LV and Ao pressures were recorded.  The pigtail catheter was exchanged for an Amplatz Extra-stiff wire in the LV apex.  Echocardiography was utilized to confirm appropriate wire position and no sign of entanglement in the mitral subvalvular apparatus.  TRANSCATHETER HEART VALVE DEPLOYMENT:  An Edwards Sapien 3 transcatheter heart valve (size 26 mm, model #9600TFX, serial [id number]) was prepared and crimped per manufacturer's guidelines, and the proper orientation of the valve is confirmed on the [employer] delivery system. The valve was advanced through the introducer sheath using normal technique until in an appropriate position in the abdominal aorta beyond the sheath tip. The balloon was then retracted and using the fine-tuning wheel was centered on the valve. The valve was then advanced across the aortic arch using appropriate flexion of the catheter. The valve was carefully positioned across the aortic valve annulus. The Commander catheter was retracted using normal technique. Once final position of the valve has been  confirmed by angiographic assessment, the valve is deployed while temporarily holding ventilation and during rapid ventricular pacing to  maintain systolic blood pressure < 50 mmHg and pulse pressure < 10 mmHg. The balloon inflation is held for >3 seconds after reaching full deployment volume. Once the balloon has fully deflated the balloon is retracted into the ascending aorta and valve function is assessed using echocardiography. There is felt to be trace paravalvular leak and no central aortic insufficiency.  The patient's hemodynamic recovery following valve deployment is good.  The deployment balloon and guidewire are both removed. Echo demostrated acceptable post-procedural gradients, stable mitral valve function, and trace aortic insufficiency.   PROCEDURE COMPLETION:  The sheath was removed and femoral artery closure is performed using the 2 previously deployed Perclose devices.  Protamine was administered once femoral arterial repair was complete. The temporary pacemaker, pigtail catheters and femoral sheaths were removed with manual pressure used for hemostasis.   The patient tolerated the procedure well and is transported to the surgical intensive care in stable condition. There were no immediate intraoperative complications. All sponge instrument and needle counts are verified correct at completion of the operation.   The patient received a total of 40 mL of intravenous contrast during the procedure.   [name], MD 02/15/2016 1:10 PM

## 2016-02-16 ENCOUNTER — Encounter (HOSPITAL_COMMUNITY): Payer: Self-pay | Admitting: General Practice

## 2016-02-16 ENCOUNTER — Inpatient Hospital Stay (HOSPITAL_COMMUNITY): Payer: Medicare Other

## 2016-02-16 ENCOUNTER — Other Ambulatory Visit: Payer: Self-pay

## 2016-02-16 DIAGNOSIS — Z954 Presence of other heart-valve replacement: Secondary | ICD-10-CM

## 2016-02-16 DIAGNOSIS — E785 Hyperlipidemia, unspecified: Secondary | ICD-10-CM

## 2016-02-16 DIAGNOSIS — I350 Nonrheumatic aortic (valve) stenosis: Principal | ICD-10-CM

## 2016-02-16 DIAGNOSIS — I25810 Atherosclerosis of coronary artery bypass graft(s) without angina pectoris: Secondary | ICD-10-CM

## 2016-02-16 DIAGNOSIS — Z952 Presence of prosthetic heart valve: Secondary | ICD-10-CM

## 2016-02-16 LAB — BASIC METABOLIC PANEL
ANION GAP: 8 (ref 5–15)
Anion gap: 8 (ref 5–15)
BUN: 9 mg/dL (ref 6–20)
BUN: 11 mg/dL (ref 6–20)
CALCIUM: 8.5 mg/dL — AB (ref 8.9–10.3)
CHLORIDE: 102 mmol/L (ref 101–111)
CO2: 25 mmol/L (ref 22–32)
CO2: 26 mmol/L (ref 22–32)
CREATININE: 0.88 mg/dL (ref 0.61–1.24)
Calcium: 8.1 mg/dL — ABNORMAL LOW (ref 8.9–10.3)
Chloride: 104 mmol/L (ref 101–111)
Creatinine, Ser: 0.74 mg/dL (ref 0.61–1.24)
GFR calc Af Amer: >60 mL/min (ref >60)
GFR calc Af Amer: >60 mL/min (ref >60)
GFR calc non Af Amer: >60 mL/min (ref >60)
GFR calc non Af Amer: >60 mL/min (ref >60)
GLUCOSE: 97 mg/dL (ref 65–99)
Glucose, Bld: 91 mg/dL (ref 65–99)
POTASSIUM: 3.9 mmol/L (ref 3.5–5.1)
Potassium: 4.2 mmol/L (ref 3.5–5.1)
SODIUM: 136 mmol/L (ref 135–145)
Sodium: 137 mmol/L (ref 135–145)

## 2016-02-16 LAB — ECHOCARDIOGRAM COMPLETE
Height: 70.5 in
Weight: 2324.53 oz

## 2016-02-16 LAB — CBC
HEMATOCRIT: 33.1 % — AB (ref 39.0–52.0)
HEMOGLOBIN: 10.7 g/dL — AB (ref 13.0–17.0)
MCH: 29.7 pg (ref 26.0–34.0)
MCHC: 32.3 g/dL (ref 30.0–36.0)
MCV: 91.9 fL (ref 78.0–100.0)
Platelets: 132 10*3/uL — ABNORMAL LOW (ref 150–400)
RBC: 3.6 MIL/uL — AB (ref 4.22–5.81)
RDW: 14.3 % (ref 11.5–15.5)
WBC: 6.8 10*3/uL (ref 4.0–10.5)

## 2016-02-16 LAB — MAGNESIUM: Magnesium: 2.4 mg/dL (ref 1.7–2.4)

## 2016-02-16 MED ORDER — MOVING RIGHT ALONG BOOK
Freq: Once | Status: AC
  Administered 2016-02-16: 17:00:00
  Filled 2016-02-16: qty 1

## 2016-02-16 MED ORDER — ASPIRIN EC 325 MG PO TBEC
325.0000 mg | DELAYED_RELEASE_TABLET | Freq: Every day | ORAL | Status: DC
  Administered 2016-02-16 – 2016-02-17 (×2): 325 mg via ORAL
  Filled 2016-02-16 (×2): qty 1

## 2016-02-16 MED ORDER — ONDANSETRON HCL 4 MG PO TABS: 4.0000 mg | ORAL_TABLET | Freq: Four times a day (QID) | ORAL | Status: DC | PRN

## 2016-02-16 MED ORDER — ALBUTEROL SULFATE (2.5 MG/3ML) 0.083% IN NEBU: 3.0000 mL | INHALATION_SOLUTION | Freq: Four times a day (QID) | RESPIRATORY_TRACT | Status: DC | PRN

## 2016-02-16 MED ORDER — ONDANSETRON HCL 4 MG/2ML IJ SOLN: 4.0000 mg | Freq: Four times a day (QID) | INTRAMUSCULAR | Status: DC | PRN

## 2016-02-16 MED ORDER — BOOST PLUS PO LIQD
237.0000 mL | Freq: Three times a day (TID) | ORAL | Status: DC
  Administered 2016-02-16 – 2016-02-17 (×3): 237 mL via ORAL
  Filled 2016-02-16 (×7): qty 237

## 2016-02-16 MED ORDER — ENSURE ENLIVE PO LIQD: 237.0000 mL | Freq: Two times a day (BID) | ORAL | Status: DC

## 2016-02-16 MED ORDER — PANTOPRAZOLE SODIUM 40 MG PO TBEC
40.0000 mg | DELAYED_RELEASE_TABLET | Freq: Every day | ORAL | Status: DC
  Administered 2016-02-17: 40 mg via ORAL
  Filled 2016-02-16 (×2): qty 1

## 2016-02-16 MED ORDER — SODIUM CHLORIDE 0.9% FLUSH: 3.0000 mL | INTRAVENOUS | Status: DC | PRN

## 2016-02-16 MED ORDER — SODIUM CHLORIDE 0.9% FLUSH: 3.0000 mL | Freq: Two times a day (BID) | INTRAVENOUS | Status: DC

## 2016-02-16 MED ORDER — SODIUM CHLORIDE 0.9 % IV SOLN: 250.0000 mL | INTRAVENOUS | Status: DC | PRN

## 2016-02-16 NOTE — Progress Notes (Signed)
Initial Nutrition Assessment  DOCUMENTATION CODES:   Severe malnutrition in context of chronic illness  INTERVENTION:   Boost Plus po TID, each supplement provides 360 kcal and 14 grams of protein  NUTRITION DIAGNOSIS:   Increased nutrient needs related to  (post-op healing) as evidenced by estimated needs  GOAL:   Patient will meet greater than or equal to 90% of their needs  MONITOR:   PO intake, Supplement acceptance, Labs, Weight trends, I & O's  REASON FOR ASSESSMENT:   Malnutrition Screening Tool  ASSESSMENT:   80 yo Male with PMH of HLD, esophageal reflux, radiation therapy, depression with diagnosis of cad/arotic stenosis    Patient s/p procedure 4/4: TRANSCATHETER AORTIC VALVE REPLACEMENT  Patient known to this RD.  Reports a fairly good appetite.  PO intake 25-50% per flowsheet records. Reports he's lost about 25 lbs in the last year. He was hospitalized here in late December with acute appendicitis. Drinks Boost supplements at home.  RD to order during this hospitalization.   Nutrition-Focused physical exam completed. Findings are severe fat depletion, severe muscle depletion, and no edema.   Diet Order:  Diet heart healthy/carb modified Room service appropriate?: Yes; Fluid consistency:: Thin  Skin:  Reviewed, no issues  Last BM:  N/A  Height:   Ht Readings from Last 1 Encounters:  02/15/16 5' 10.5" (1.791 m)    Weight:   Wt Readings from Last 1 Encounters:  02/16/16 145 lb 4.5 oz (65.9 kg)    Wt Readings from Last 20 Encounters:  02/16/16 145 lb 4.5 oz (65.9 kg)  02/11/16 144 lb 10 oz (65.6 kg)  02/02/16 145 lb (65.772 kg)  01/20/16 145 lb (65.772 kg)  01/17/16 140 lb (63.504 kg)  01/05/16 142 lb (64.411 kg)  12/30/15 135 lb (61.236 kg)  12/23/15 135 lb 11.2 oz (61.553 kg)  12/21/15 139 lb (63.05 kg)  12/16/15 136 lb (61.689 kg)  12/02/15 136 lb (61.689 kg)  11/26/15 146 lb (66.225 kg)  11/12/15 142 lb (64.411 kg)  11/11/15 147 lb  3.2 oz (66.769 kg)  10/15/15 149 lb (67.586 kg)  10/12/15 149 lb 1.9 oz (67.64 kg)  08/30/15 147 lb (66.679 kg)  08/19/15 150 lb (68.04 kg)  08/11/15 149 lb 1.9 oz (67.64 kg)  07/23/15 149 lb 6.4 oz (67.767 kg)    Ideal Body Weight:  75 kg  BMI:  Body mass index is 20.54 kg/(m^2).  Estimated Nutritional Needs:   Kcal:  [id number]  Protein:  80-90 gm  Fluid:  1.7-1.9 L  EDUCATION NEEDS:   No education needs identified at this time  Arthur Holms, RD, LDN Pager #: [phone] After-Hours Pager #: [phone]

## 2016-02-16 NOTE — Progress Notes (Signed)
1 Day Post-Op Procedure(s) (LRB): TRANSCATHETER AORTIC VALVE REPLACEMENT, TRANSFEMORAL (N/A) TRANSESOPHAGEAL ECHOCARDIOGRAM (TEE) (N/A)  Subjective:  No complaints  Objective: Vital signs in last 24 hours: Temp:  [97 F (36.1 C)-97.5 F (36.4 C)] 97.4 F (36.3 C) (04/05 0400) Pulse Rate:  [66-80] 68 (04/05 0800) Cardiac Rhythm:  [-] Normal sinus rhythm (04/05 0800) Resp:  [6-26] 11 (04/04 1700) BP: (96-139)/(48-71) 101/54 mmHg (04/05 0800) SpO2:  [95 %-100 %] 96 % (04/05 0800) Arterial Line BP: (140-155)/(38-69) 155/54 mmHg (04/04 1700) Weight:  [65.601 kg (144 lb 10 oz)-65.9 kg (145 lb 4.5 oz)] 65.9 kg (145 lb 4.5 oz) (04/05 0500)  Hemodynamic parameters for last 24 hours:    Intake/Output from previous day: 04/04 0701 - 04/05 0700 In: 2934.6 [P.O.:180; I.V.:1784.6; IV Piggyback:970] Out: 1335 [Urine:1285; Blood:50] Intake/Output this shift:    General appearance: alert and cooperative Neurologic: intact Heart: regular rate and rhythm, S1, S2 normal, no murmur, click, rub or gallop Lungs: clear to auscultation bilaterally Wound: groin sites ok  Lab Results:  Recent Labs  02/15/16 1941 02/16/16 0247  WBC 7.6 6.8  HGB 11.8* 10.7*  HCT 36.6* 33.1*  PLT 146* 132*   BMET:  Recent Labs  02/15/16 1459 02/15/16 1941 02/16/16 0247  NA 139  --  137  K 3.7  --  3.9  CL  --   --  104  CO2  --   --  25  GLUCOSE 101*  --  97  BUN  --   --  9  CREATININE  --  0.76 0.74  CALCIUM  --   --  8.1*    PT/INR:  Recent Labs  02/15/16 1313  LABPROT 17.2*  INR 1.40   ABG    Component Value Date/Time   PHART 7.347* 02/15/2016 1503   HCO3 25.6* 02/15/2016 1503   TCO2 27 02/15/2016 1503   ACIDBASEDEF 1.0 02/15/2016 1503   O2SAT 96.0 02/15/2016 1503   CBG (last 3)  No results for input(s): GLUCAP in the last 72 hours.  Assessment/Plan: S/P Procedure(s) (LRB): TRANSCATHETER AORTIC VALVE REPLACEMENT, TRANSFEMORAL (N/A) TRANSESOPHAGEAL ECHOCARDIOGRAM (TEE)  (N/A)  Doing well following TAVR.  Echo today Plavix and ASA started DC sleeve, transfer to 2W and mobilize Plan home tomorrow if no changes.   LOS: 1 day    Gaye Pollack 02/16/2016

## 2016-02-16 NOTE — Progress Notes (Signed)
Pt ambulated 550 feet around unit with RN. Pt tolerated well

## 2016-02-16 NOTE — Progress Notes (Signed)
  Echocardiogram 2D Echocardiogram has been performed.  Corey Oneill 02/16/2016, 10:25 AM

## 2016-02-16 NOTE — Progress Notes (Signed)
Pt transferred to 2West via ambulation, vss, pt settled in bed, receiving RN at bedside, wife at bedside. Kathleen Argue S 2:50 PM

## 2016-02-16 NOTE — Anesthesia Postprocedure Evaluation (Signed)
Anesthesia Post Note  Patient: Corey Oneill  Procedure(s) Performed: Procedure(s) (LRB): TRANSCATHETER AORTIC VALVE REPLACEMENT, TRANSFEMORAL (N/A) TRANSESOPHAGEAL ECHOCARDIOGRAM (TEE) (N/A)  Patient location during evaluation: PACU Anesthesia Type: General Level of consciousness: awake and alert and patient cooperative Pain management: pain level controlled Vital Signs Assessment: post-procedure vital signs reviewed and stable Respiratory status: spontaneous breathing and respiratory function stable Cardiovascular status: stable Anesthetic complications: no    Last Vitals:  Filed Vitals:   02/16/16 0800 02/16/16 0826  BP: 101/54   Pulse: 68   Temp:  36.5 C  Resp:      Last Pain:  Filed Vitals:   02/16/16 0826  PainSc: 0-No pain                 Alyxandria Wentz S

## 2016-02-16 NOTE — Progress Notes (Signed)
Report given to [facility] on 2[facility].   Holley Wirt S 2:04 PM

## 2016-02-16 NOTE — Progress Notes (Signed)
TELEMETRY: Reviewed telemetry pt in NSR with rare PVC: Filed Vitals:   02/16/16 0700 02/16/16 0800 02/16/16 0826 02/16/16 0903  BP: 104/54 101/54  108/64  Pulse: 72 68  79  Temp:   97.7 F (36.5 C)   TempSrc:   Oral   Resp:      Height:      Weight:      SpO2: 98% 96%  98%    Intake/Output Summary (Last 24 hours) at 02/16/16 1001 Last data filed at 02/16/16 0400  Gross per 24 hour  Intake 2934.59 ml  Output   1335 ml  Net 1599.59 ml   Filed Weights   02/15/16 0847 02/16/16 0500  Weight: 65.601 kg (144 lb 10 oz) 65.9 kg (145 lb 4.5 oz)    Subjective Feels well. No chest pain or SOB.   [name] acetaminophen  1,000 mg Oral 4 times per day   Or  . acetaminophen (TYLENOL) oral liquid 160 mg/5 mL  1,000 mg Per Tube 4 times per day  . acetaminophen (TYLENOL) oral liquid 160 mg/5 mL  650 mg Per Tube Once   Or  . acetaminophen  650 mg Rectal Once  . albuterol  3 mL Inhalation QHS  . aspirin EC  325 mg Oral Daily   Or  . aspirin  324 mg Per Tube Daily  . buPROPion  100 mg Oral Daily  . cefUROXime (ZINACEF)  IV  1.5 g Intravenous Q12H  . clopidogrel  75 mg Oral Daily  . FLUoxetine  10 mg Oral Daily  . insulin regular  0-10 Units Intravenous TID WC  . pantoprazole  40 mg Oral Daily  . rosuvastatin  10 mg Oral QHS   . insulin (NOVOLIN-R) infusion    . nitroGLYCERIN Stopped (02/15/16 1645)  . phenylephrine (NEO-SYNEPHRINE) Adult infusion      LABS: Basic Metabolic Panel:  Recent Labs  02/15/16 1459 02/15/16 1941 02/16/16 0247  NA 139  --  137  K 3.7  --  3.9  CL  --   --  104  CO2  --   --  25  GLUCOSE 101*  --  97  BUN  --   --  9  CREATININE  --  0.76 0.74  CALCIUM  --   --  8.1*  MG  --  2.8* 2.4   Liver Function Tests: No results for input(s): AST, ALT, ALKPHOS, BILITOT, PROT, ALBUMIN in the last 72 hours. No results for input(s): LIPASE, AMYLASE in the last 72 hours. CBC:  Recent Labs  02/15/16 1941 02/16/16 0247  WBC 7.6 6.8  HGB 11.8* 10.7*  HCT  36.6* 33.1*  MCV 92.2 91.9  PLT 146* 132*   Cardiac Enzymes: No results for input(s): CKTOTAL, CKMB, CKMBINDEX, TROPONINI in the last 72 hours. BNP: No results for input(s): PROBNP in the last 72 hours. D-Dimer: No results for input(s): DDIMER in the last 72 hours. Hemoglobin A1C: No results for input(s): HGBA1C in the last 72 hours. Fasting Lipid Panel: No results for input(s): CHOL, HDL, LDLCALC, TRIG, CHOLHDL, LDLDIRECT in the last 72 hours. Thyroid Function Tests: No results for input(s): TSH, T4TOTAL, T3FREE, THYROIDAB in the last 72 hours.  Invalid input(s): FREET3   Radiology/Studies:  Dg Chest Port 1 View  02/16/2016  CLINICAL DATA:  Post transcatheter aortic valve replacement EXAM: PORTABLE CHEST 1 VIEW COMPARISON:  02/15/2016 FINDINGS: Cardiomediastinal silhouette is stable. Again noted status post aortic valve replacement. Right IJ sheath is unchanged in position.  No infiltrate or pleural effusion. No pulmonary edema. IMPRESSION: Status post aortic valve replacement. Right IJ sheath is unchanged in position. No acute infiltrate or pulmonary edema. No pneumothorax. Electronically Signed   By: Lahoma Crocker M.D.   On: 02/16/2016 07:59   Dg Chest Port 1 View  02/15/2016  CLINICAL DATA:  Post transcatheter aortic valve replacement EXAM: PORTABLE CHEST 1 VIEW COMPARISON:  02/11/2016 FINDINGS: Cardiomediastinal silhouette is stable. The patient is status post aortic valve replacement. Right IJ sheath in place is noted with tip in upper SVC. No infiltrate or pulmonary edema. There is no pneumothorax. IMPRESSION: Status post aortic valve replacement. Right IJ sheath in place. No infiltrate or pulmonary edema. Electronically Signed   By: Lahoma Crocker M.D.   On: 02/15/2016 14:19    PHYSICAL EXAM General: Well developed, well nourished, in no acute distress. Head: Normal Neck: Negative for carotid bruits. JVD not elevated. No adenopathy Lungs: Clear  Heart: RRR S1 S2 without murmurs, rubs,  or gallops.  Abdomen: Soft, non-tender Extremities: No clubbing, cyanosis or edema.  Distal pedal pulses are 2+ and equal bilaterally. Groin site is OK. Neuro: Alert and oriented X 3. Moves all extremities spontaneously. Psych:  Responds to questions appropriately with a normal affect.  ASSESSMENT AND PLAN: 1. Severe AS s/p TAVR. Normal exam. Echo in process. Resume ASA and Plavix. 2. CAD s/p stenting of distal RCA. Resume DAPT and statin. 3. Hyperlipidemia. Resume statin.  Plan: transfer to floor today and ambulate. Possible DC tomorrow.   Present on Admission:  **None**  Signed, Peter Martinique, [place] 02/16/2016 10:01 AM

## 2016-02-17 ENCOUNTER — Other Ambulatory Visit (HOSPITAL_COMMUNITY): Payer: Medicare Other

## 2016-02-17 ENCOUNTER — Other Ambulatory Visit: Payer: Self-pay | Admitting: *Deleted

## 2016-02-17 LAB — BASIC METABOLIC PANEL
ANION GAP: 9 (ref 5–15)
BUN: 10 mg/dL (ref 6–20)
CALCIUM: 8.7 mg/dL — AB (ref 8.9–10.3)
CO2: 27 mmol/L (ref 22–32)
CREATININE: 0.84 mg/dL (ref 0.61–1.24)
Chloride: 103 mmol/L (ref 101–111)
GFR calc Af Amer: >60 mL/min (ref >60)
GLUCOSE: 99 mg/dL (ref 65–99)
Potassium: 3.9 mmol/L (ref 3.5–5.1)
Sodium: 139 mmol/L (ref 135–145)

## 2016-02-17 LAB — CBC
HCT: 33.4 % — ABNORMAL LOW (ref 39.0–52.0)
Hemoglobin: 11.2 g/dL — ABNORMAL LOW (ref 13.0–17.0)
MCH: 31.4 pg (ref 26.0–34.0)
MCHC: 33.5 g/dL (ref 30.0–36.0)
MCV: 93.6 fL (ref 78.0–100.0)
PLATELETS: 129 10*3/uL — AB (ref 150–400)
RBC: 3.57 MIL/uL — AB (ref 4.22–5.81)
RDW: 14.7 % (ref 11.5–15.5)
WBC: 5.4 10*3/uL (ref 4.0–10.5)

## 2016-02-17 MED ORDER — METOPROLOL TARTRATE 12.5 MG HALF TABLET
12.5000 mg | ORAL_TABLET | Freq: Two times a day (BID) | ORAL | Status: DC
  Administered 2016-02-17: 12.5 mg via ORAL
  Filled 2016-02-17: qty 1

## 2016-02-17 MED ORDER — CALCIUM CARBONATE ANTACID 500 MG PO CHEW
1.0000 | CHEWABLE_TABLET | Freq: Three times a day (TID) | ORAL | Status: DC | PRN
  Administered 2016-02-17: 200 mg via ORAL
  Filled 2016-02-17: qty 1

## 2016-02-17 MED ORDER — METOPROLOL TARTRATE 25 MG PO TABS: 12.5000 mg | ORAL_TABLET | Freq: Two times a day (BID) | ORAL | Status: DC

## 2016-02-17 NOTE — Consult Note (Signed)
   [facility] CM Inpatient Consult   02/17/2016  LANIS HOTTE 13-Dec-1935 [id number]   Patient screened for [place] [facility] services. Went to bedside to discuss and offer [place] Management program. Patient states he has had a lot of medical issues as of late. He is agreeable to [place] Management follow up and written consent obtained. He has had x3 admits in the past 6 months. However, he is s/p TAVR this admission. Explained that he will receive post hospital discharge calls and will be evaluated for home monthly visits. He confirms his Primary Care MD is Dr. Redmond School. Confirmed best contact number is [phone]. He states he lives with his wife. [facility] Care Management packet and contact information left at bedside. Made inpatient RNCM aware.  Will request patient to be assigned to [place] for follow up. Patient reports he is to discharge today 02/17/16.  Marthenia Rolling, MSN-Ed, RN,BSN [facility] Liaison [phone]

## 2016-02-17 NOTE — Discharge Summary (Signed)
Physician Discharge Summary       [address]       [place] [address]             [phone]    Patient ID: Corey Oneill MRN: [id number] DOB/AGE: Apr 04, 1936 80 y.o.  Admit date: 02/15/2016 Discharge date: 02/17/2016  Admission Diagnoses: Severe aortic stenosis  Active Diagnoses:  1.Hyperlipidemia 2. CAD (coronary artery disease) LHC 8/16: Mid to distal LAD 30%, OM1 40%, proximal mid RCA 40%, distal RCA 60% >> FFR 0.69 >> PCI: 3 x 15 mm Resolute DES 3.Prostate cancer ([place]). History of radiation therapy 4. Radiation proctitis 5. Tobacco abuse  Procedure (s):   Transcatheter Aortic Valve Replacement - Percutaneous Right Transfemoral Approach Edwards Sapien 3 THV (size 26 mm, model # 9600TFX, serial # [id number]) by Dr. Cyndia Bent on 02/15/2016.  History of Presenting Illness: The patient is a 80 year old gentleman with a history of coronary artery disease and aortic stenosis who presented in 03/2015 with exertional angina and moderate AS by echo. He underwent cath on 04/21/2015 showing moderate coronary disease and moderate AS and medical therapy was recommended. He continued having angina and repeat cath in August 2016 showed a 60% distal RCA stenosis with an FFR of 0.69. This was treated with a DES. There was still moderate AS with a mean gradient of 35.6 mm Hg and a peak of 41. AVA was 1.21 cm2. He says that his symptoms did not improve with PCI. He ha a repeat cath four days later that showed a patent stent and no new source for angina. He continued to have some chest discomfort with exertion that he describes as an aching in his chest. He denies shortness of breath and orthopnea. He has had some dizziness. A repeat echo was done on 11/01/2015 showing a severely calcified aortic valve with a mean gradient of 34 mm Hg and a DI of 0.2. His EF was 55-60%. He was admitted in January 2017 with a perforated appendicitis and has had a slow recovery. He  was subsequently admitted in early February with chest pain and had negative enzymes. He was seen by Dr. Burt Knack for consideration of TAVR and a repeat cath on 01/17/2016 showed a patent RCA stent and a heavily calcified AV with a mean gradient of 36 mm Hg. The patient and his wife were counseled at length regarding treatment alternatives for management of severe symptomatic aortic stenosis. Alternative approaches such as conventional aortic valve replacement, transcatheter aortic valve replacement, and palliative medical therapy were compared and contrasted at length. The risks associated with conventional surgical aortic valve replacement were been discussed in detail, as were expectations for post-operative convalescence. Long-term prognosis with medical therapy was discussed. He would like to proceed with TAVR.  Brief Hospital Course:  He has remained afebrile and hemodynamically stable. He was on Nitro drip for blood pressure, but was weaned off. He was started on Plavix and ecasa. He was not put on Lopressor until post op day 2 as he had been previously bradycardic. Follow up echo done 04/05 showed LVEF 60-65%, S/P TAVR/ AVR appears well seated with normal function and trivial perivalvular leak. Peak velocity (S): 228 cm/s. Mean gradient (S): 12 mm Hg.Transvalvular velocity was within the normal range. There was no stenosis. There was no regurgitation.He was felt surgically stable for transfer from the ICU to [facility] for further convalescence on 02/16/2015. He is ambulating on room air. He is tolerating a diet. His right groin wound  is mostly dry. There is no hematoma. His lower extremity pulses are intact and feet are warm. He is felt surgically stable for discharge today.   Latest Vital Signs: Blood pressure 129/66, pulse 92, temperature 97.7 F (36.5 C), temperature source Oral, resp. rate 18, height 5' 10.5" (1.791 m), weight 145 lb 6.4 oz (65.953 kg), SpO2 99 %.  Physical Exam: Cardiovascular:  RRR, no murmur Pulmonary: Clear to auscultation bilaterally; no rales, wheezes, or rhonchi. Abdomen: Soft, non tender, bowel sounds present. Extremities: No lower extremity edema. Pulses intact. Wounds: Small bandage intact. No hematoma.  Discharge Condition:Stable and discharged to home  Recent laboratory studies:  Lab Results  Component Value Date   WBC 5.4 02/17/2016   HGB 11.2* 02/17/2016   HCT 33.4* 02/17/2016   MCV 93.6 02/17/2016   PLT 129* 02/17/2016   Lab Results  Component Value Date   NA 139 02/17/2016   K 3.9 02/17/2016   CL 103 02/17/2016   CO2 27 02/17/2016   CREATININE 0.84 02/17/2016   GLUCOSE 99 02/17/2016    Diagnostic Studies:  Ct Coronary Morp W/cta Cor W/score W/ca W/cm &/or Wo/cm  01/26/2016  ADDENDUM REPORT: 01/26/2016 18:27 CLINICAL DATA:  80 year old male with severe aortic stenosis. EXAM: Cardiac TAVR CT TECHNIQUE: The patient was scanned on a Philips 256 scanner. A 120 kV retrospective scan was triggered in the descending thoracic aorta at 111 HU's. Gantry rotation speed was 270 msecs and collimation was .9 mm. No beta blockade or nitro were given. The 3D data set was reconstructed in 5% intervals of the R-R cycle. Systolic and diastolic phases were analyzed on a dedicated work station using MPR, MIP and VRT modes. The patient received 80 cc of contrast. FINDINGS: Aortic Valve: Trileaflet, severely thickened and calcified with severely restricted leaflet opening. There are minimal calcifications in the LVOT. Aorta:  Normal size, mild diffuse calcifications.  No dissection. Sinotubular Junction:  31 x 29 mm Ascending Thoracic Aorta:  36 x 34 mm Aortic Arch:  27 x 26 mm Descending Thoracic Aorta:  23 x 23 mm Sinus of Valsalva Measurements: Non-coronary:  32 mm Right -coronary:  32 mm Left -coronary:  32 mm Coronary Artery Height above Annulus: Left Main:  14 mm Right Coronary:  18 mm Virtual Basal Annulus Measurements: Maximum/Minimum Diameter:  27.6 x 24 mm  Perimeter:  92 mm Area:  534 mm2 Optimum Fluoroscopic Angle for Delivery:  LAO 10 CAU 11 IMPRESSION: 1. Trileaflet, severely thickened and calcified with severely restricted leaflet opening with annular measurements suitable for 26 mm Edward-SAPIEN 3 TAVR valve. 2.  Sufficient annulus to coronary distance. 3. Optimum Fluoroscopic Angle for Delivery:  LAO 10 CAU 11 Ena Dawley Electronically Signed   By: Ena Dawley   On: 01/26/2016 18:27  01/25/2016  ADDENDUM REPORT: 01/25/2016 10:26 ADDENDUM: Please note that a dedicated CT chest has been performed and will be dedicated separately. In addition to the subpleural left lower lobe nodule, additional nodules are present, including: --6 x 4 mm nodule in the right upper lobe (series 412/image 13) --6 mm irregular nodule in the medial right upper lobe (series 412/image 38) --6 mm nodule in the medial left upper lobe (series 412/image 47) --9 x 8 mm nodule in the central right middle lobe (series 412/image 56) Please refer to the dedicated CT chest/abdomen/pelvis dictation for definitive recommendations based on the dominant nodule. Electronically Signed   By: Julian Hy M.D.   On: 01/25/2016 10:26  01/26/2016  EXAM: OVER-READ INTERPRETATION  CT CHEST The following report is an over-read performed by radiologist Dr. Julian Hy of [facility], [place] on 01/25/2016. This over-read does not include interpretation of cardiac or coronary anatomy or pathology. The coronary CTA interpretation by the cardiologist is attached. COMPARISON:  Partial comparison to CT abdomen pelvis dated 11/12/2015. FINDINGS: 5 x 4 mm subpleural nodule in the posterior left lower lobe (series 412/image 62). Mild dependent atelectasis in the bilateral lower lobes. No focal consolidation. Mild subpleural reticulation. No pleural effusion or pneumothorax. Small mediastinal lymph nodes, including a 12 mm short axis low right paratracheal node (series 413/ image 36). Calcified  left perihilar/infrahilar nodes. Visualized upper abdomen is unremarkable. Degenerative changes of the visualized thoracolumbar spine. IMPRESSION: 5 x 4 mm subpleural nodule in the posterior left lower lobe. This is unchanged from 11/12/2015. Given the lack of suspicious findings for metastatic disease elsewhere on CT abdomen/pelvis, this is unlikely to be related to the patient's diagnosis of prostate cancer. Per revised 2017 Fleischner Society guidelines, if this patient is high risk for primary bronchogenic neoplasm, a follow-up CT chest is suggested in 12 months. If low risk, no dedicated follow-up imaging is required. Electronically Signed: By: Julian Hy M.D. On: 01/25/2016 10:10   Dg Chest Port 1 View  02/16/2016  CLINICAL DATA:  Post transcatheter aortic valve replacement EXAM: PORTABLE CHEST 1 VIEW COMPARISON:  02/15/2016 FINDINGS: Cardiomediastinal silhouette is stable. Again noted status post aortic valve replacement. Right IJ sheath is unchanged in position. No infiltrate or pleural effusion. No pulmonary edema. IMPRESSION: Status post aortic valve replacement. Right IJ sheath is unchanged in position. No acute infiltrate or pulmonary edema. No pneumothorax. Electronically Signed   By: Lahoma Crocker M.D.   On: 02/16/2016 07:59   Dg Chest Port 1 View  02/15/2016  CLINICAL DATA:  Post transcatheter aortic valve replacement EXAM: PORTABLE CHEST 1 VIEW COMPARISON:  02/11/2016 FINDINGS: Cardiomediastinal silhouette is stable. The patient is status post aortic valve replacement. Right IJ sheath in place is noted with tip in upper SVC. No infiltrate or pulmonary edema. There is no pneumothorax. IMPRESSION: Status post aortic valve replacement. Right IJ sheath in place. No infiltrate or pulmonary edema. Electronically Signed   By: Lahoma Crocker M.D.   On: 02/15/2016 14:19   Ct Angio Chest Aorta W/cm &/or Wo/cm  01/25/2016  CLINICAL DATA:  80 year old male with history of severe aortic stenosis.  Preprocedural study prior to potential transcatheter aortic valve replacement (TAVR) procedure. EXAM: CT ANGIOGRAPHY CHEST, ABDOMEN AND PELVIS TECHNIQUE: Multidetector CT imaging through the chest, abdomen and pelvis was performed using the standard protocol during bolus administration of intravenous contrast. Multiplanar reconstructed images and MIPs were obtained and reviewed to evaluate the vascular anatomy. CONTRAST:  62mL OMNIPAQUE IOHEXOL 350 MG/ML SOLN COMPARISON:  CT the abdomen and pelvis 11/15/2015. FINDINGS: CTA CHEST FINDINGS Mediastinum/Lymph Nodes: Heart size is normal. Small amount of pericardial fluid and/or thickening, unlikely to be of any hemodynamic significance at this time. Small amount of anterior pericardial calcification. There is atherosclerosis of the thoracic aorta, the great vessels of the mediastinum and the coronary arteries, including calcified atherosclerotic plaque in the left main, left anterior descending, left circumflex and right coronary arteries. Thickening calcification of the aortic valve. Calcification of the mitral annulus. Multiple borderline enlarged and mildly enlarged mediastinal and hilar lymph nodes are noted, largest of which is a 12 mm low right paratracheal lymph node. Esophagus is unremarkable in appearance. No axillary lymphadenopathy. Lungs/Pleura: There are multiple pulmonary nodules scattered  throughout the lungs bilaterally, the largest of which measures up to 8 x 6 mm (7 mm mean diameter) in the posterior right upper lobe (image 16 of series 407), and 8 x 5 mm in the lateral segment of the right middle lobe (image 36 of series 407). Most of these are solid in appearance. However, in the medial aspect of the inferior right upper lobe shortly above the minor fissure there is also a sub solid nodule that has a ground-glass attenuation component measuring 10 x 7 mm and a central solid component it measures 2 mm (images 26 of series 407 and image 63 of series  401). No acute consolidative airspace disease. No pleural effusions. Dependent areas of scarring or subsegmental atelectasis are noted in the lungs bilaterally. Musculoskeletal/Soft Tissues: Compression fracture of superior endplate of 624THL with a small amount of paravertebral soft tissue thickening on the right side, likely subacute, with approximately 15-20% loss of anterior vertebral body height. There are no aggressive appearing lytic or blastic lesions noted in the visualized portions of the skeleton. CTA ABDOMEN AND PELVIS FINDINGS Hepatobiliary: 9 x 8 mm low attenuation lesion in the inferior aspect of segment 5 of the liver (image 197 of series 401) and tiny 3 mm low-attenuation lesion in segment 4A are both too small to definitively characterize, but are similar to prior studies, likely tiny cysts. The liver has a slightly shrunken appearance and nodular contour, suggestive of underlying cirrhosis. Tiny calcified granuloma posteriorly in the right lobe of the liver incidentally noted. No suspicious hepatic lesions are identified. No intra or extrahepatic biliary ductal dilatation. Gallbladder is normal in appearance. Pancreas: No pancreatic mass. No pancreatic ductal dilatation. No pancreatic or peripancreatic fluid or inflammatory changes. Spleen: Unremarkable. Adrenals/Urinary Tract: Exophytic 1.7 cm intermediate attenuation (38 HU) lesion in the lateral aspect of the lower pole of the right kidney is unchanged compared to numerous prior examinations, presumably a mildly proteinaceous cyst. Sub cm low-attenuation lesion in the lower pole of the left kidney is also unchanged, and although too small to definitively characterize, these are likely tiny cysts. Bilateral adrenal glands are normal in appearance. No hydroureteronephrosis. Urinary bladder wall appears slightly thickened 6, without a definite focal mass. Stomach/Bowel: Stomach is normal in appearance. No pathologic dilatation of small bowel or  colon. Colonic wall thickening in the region of the cecum, best appreciated on axial image 223 of series 401 and coronal image 41 of series 4013. Vascular/Lymphatic: Vascular findings and measurements pertinent to potential TAVR procedure, as detailed below. No aneurysm or dissection identified in the abdominal or pelvic vasculature. No lymphadenopathy noted in the abdomen or pelvis. Reproductive: Fiducial markers adjacent to the prostate gland. Prostate gland and seminal vesicles are otherwise unremarkable in appearance. Other: Ill-defined fatty tissue use in the low anatomic pelvis, potentially related to prior radiation therapy. No significant volume of ascites. No pneumoperitoneum. Musculoskeletal: Well-defined sclerotic lesion measuring 8 mm in the left side of the L4 vertebral body is similar to prior study from 07/14/2015, likely a tiny bone island. There is also a mixed lucent and sclerotic lesion in the left side of the ileum (image 216 of series 401), which is unchanged dating back to 07/14/2015, and although indeterminate, favored to be benign, potentially an area of fibrous dysplasia. VASCULAR MEASUREMENTS PERTINENT TO TAVR: AORTA: Minimal Aortic Diameter -  14 x 15 mm Severity of Aortic Calcification -  moderate RIGHT PELVIS: Right Common Iliac Artery - Minimal Diameter - 10.0 x 8.7 mm Tortuosity -  moderate to severe Calcification - mild Right External Iliac Artery - Minimal Diameter - 7.8 x 7.0 mm Tortuosity - moderate to severe Calcification - mild Right Common Femoral Artery - Minimal Diameter - 8.6 x 7.1 mm Tortuosity - mild Calcification - mild LEFT PELVIS: Left Common Iliac Artery - Minimal Diameter - 9.4 x 9.2 mm Tortuosity - moderate to severe Calcification - mild Left External Iliac Artery - Minimal Diameter - 8.5 x 8.9 mm Tortuosity - moderate to severe Calcification - mild Left Common Femoral Artery - Minimal Diameter - 9.1 x 7.5 mm Tortuosity - mild Calcification - mild Review of the MIP  images confirms the above findings. IMPRESSION: 1. Vascular findings and measurements pertinent to potential TAVR procedure, as detailed above. This patient appears to have suitable pelvic arterial access bilaterally, although common iliac and external iliac arteries are rather tortuous. 2. Severe thickening calcification of the aortic valve, compatible with the reported clinical history of severe aortic stenosis. 3. Colonic wall thickening in the region of the cecum, slightly mass-like in appearance. Correlation with nonemergent colonoscopy is recommended in the near future to exclude colonic neoplasm. 4. Multiple small pulmonary nodules in the lungs bilaterally, measuring up to 7 mm in the posterior aspect of the right upper lobe. The majority of these are solid, however, there is a sub solid nodule in the inferior right upper lobe which has a mean diameter of 9 mm with a central 2 mm solid component. Non-contrast chest CT at 3-6 months is recommended. If nodules persist, subsequent management will be based upon the most suspicious nodule(s). This recommendation follows the consensus statement: Guidelines for Management of Incidental Pulmonary Nodules Detected on CT Images:From the Fleischner Society 2017; published online before print (10.1148/radiol.SG:5268862). 5. Mild thickening of the urinary bladder, most evident near the bladder apex, favored to be related to prior radiation therapy. No discrete bladder wall mass is identified at this time. 6. Mild pericardial thickening and calcification. No morphologic changes in the heart identified at this time to strongly suggest presence of constrictive physiology, but echocardiographic correlation is suggested. 7. Multiple borderline enlarged and minimally enlarged mediastinal and hilar lymph nodes. These are nonspecific. Attention at time of followup chest CT is recommended. 8. Additional incidental findings, as above. Electronically Signed   By: Vinnie Langton  M.D.   On: 01/25/2016 11:09   Ct Angio Abd/pel W/ And/or W/o  01/25/2016  CLINICAL DATA:  80 year old male with history of severe aortic stenosis. Preprocedural study prior to potential transcatheter aortic valve replacement (TAVR) procedure. EXAM: CT ANGIOGRAPHY CHEST, ABDOMEN AND PELVIS TECHNIQUE: Multidetector CT imaging through the chest, abdomen and pelvis was performed using the standard protocol during bolus administration of intravenous contrast. Multiplanar reconstructed images and MIPs were obtained and reviewed to evaluate the vascular anatomy. CONTRAST:  41mL OMNIPAQUE IOHEXOL 350 MG/ML SOLN COMPARISON:  CT the abdomen and pelvis 11/15/2015. FINDINGS: CTA CHEST FINDINGS Mediastinum/Lymph Nodes: Heart size is normal. Small amount of pericardial fluid and/or thickening, unlikely to be of any hemodynamic significance at this time. Small amount of anterior pericardial calcification. There is atherosclerosis of the thoracic aorta, the great vessels of the mediastinum and the coronary arteries, including calcified atherosclerotic plaque in the left main, left anterior descending, left circumflex and right coronary arteries. Thickening calcification of the aortic valve. Calcification of the mitral annulus. Multiple borderline enlarged and mildly enlarged mediastinal and hilar lymph nodes are noted, largest of which is a 12 mm low right paratracheal lymph node. Esophagus is unremarkable  in appearance. No axillary lymphadenopathy. Lungs/Pleura: There are multiple pulmonary nodules scattered throughout the lungs bilaterally, the largest of which measures up to 8 x 6 mm (7 mm mean diameter) in the posterior right upper lobe (image 16 of series 407), and 8 x 5 mm in the lateral segment of the right middle lobe (image 36 of series 407). Most of these are solid in appearance. However, in the medial aspect of the inferior right upper lobe shortly above the minor fissure there is also a sub solid nodule that has a  ground-glass attenuation component measuring 10 x 7 mm and a central solid component it measures 2 mm (images 26 of series 407 and image 63 of series 401). No acute consolidative airspace disease. No pleural effusions. Dependent areas of scarring or subsegmental atelectasis are noted in the lungs bilaterally. Musculoskeletal/Soft Tissues: Compression fracture of superior endplate of 624THL with a small amount of paravertebral soft tissue thickening on the right side, likely subacute, with approximately 15-20% loss of anterior vertebral body height. There are no aggressive appearing lytic or blastic lesions noted in the visualized portions of the skeleton. CTA ABDOMEN AND PELVIS FINDINGS Hepatobiliary: 9 x 8 mm low attenuation lesion in the inferior aspect of segment 5 of the liver (image 197 of series 401) and tiny 3 mm low-attenuation lesion in segment 4A are both too small to definitively characterize, but are similar to prior studies, likely tiny cysts. The liver has a slightly shrunken appearance and nodular contour, suggestive of underlying cirrhosis. Tiny calcified granuloma posteriorly in the right lobe of the liver incidentally noted. No suspicious hepatic lesions are identified. No intra or extrahepatic biliary ductal dilatation. Gallbladder is normal in appearance. Pancreas: No pancreatic mass. No pancreatic ductal dilatation. No pancreatic or peripancreatic fluid or inflammatory changes. Spleen: Unremarkable. Adrenals/Urinary Tract: Exophytic 1.7 cm intermediate attenuation (38 HU) lesion in the lateral aspect of the lower pole of the right kidney is unchanged compared to numerous prior examinations, presumably a mildly proteinaceous cyst. Sub cm low-attenuation lesion in the lower pole of the left kidney is also unchanged, and although too small to definitively characterize, these are likely tiny cysts. Bilateral adrenal glands are normal in appearance. No hydroureteronephrosis. Urinary bladder wall appears  slightly thickened 6, without a definite focal mass. Stomach/Bowel: Stomach is normal in appearance. No pathologic dilatation of small bowel or colon. Colonic wall thickening in the region of the cecum, best appreciated on axial image 223 of series 401 and coronal image 41 of series 4013. Vascular/Lymphatic: Vascular findings and measurements pertinent to potential TAVR procedure, as detailed below. No aneurysm or dissection identified in the abdominal or pelvic vasculature. No lymphadenopathy noted in the abdomen or pelvis. Reproductive: Fiducial markers adjacent to the prostate gland. Prostate gland and seminal vesicles are otherwise unremarkable in appearance. Other: Ill-defined fatty tissue use in the low anatomic pelvis, potentially related to prior radiation therapy. No significant volume of ascites. No pneumoperitoneum. Musculoskeletal: Well-defined sclerotic lesion measuring 8 mm in the left side of the L4 vertebral body is similar to prior study from 07/14/2015, likely a tiny bone island. There is also a mixed lucent and sclerotic lesion in the left side of the ileum (image 216 of series 401), which is unchanged dating back to 07/14/2015, and although indeterminate, favored to be benign, potentially an area of fibrous dysplasia. VASCULAR MEASUREMENTS PERTINENT TO TAVR: AORTA: Minimal Aortic Diameter -  14 x 15 mm Severity of Aortic Calcification -  moderate RIGHT PELVIS: Right Common  Iliac Artery - Minimal Diameter - 10.0 x 8.7 mm Tortuosity - moderate to severe Calcification - mild Right External Iliac Artery - Minimal Diameter - 7.8 x 7.0 mm Tortuosity - moderate to severe Calcification - mild Right Common Femoral Artery - Minimal Diameter - 8.6 x 7.1 mm Tortuosity - mild Calcification - mild LEFT PELVIS: Left Common Iliac Artery - Minimal Diameter - 9.4 x 9.2 mm Tortuosity - moderate to severe Calcification - mild Left External Iliac Artery - Minimal Diameter - 8.5 x 8.9 mm Tortuosity - moderate to  severe Calcification - mild Left Common Femoral Artery - Minimal Diameter - 9.1 x 7.5 mm Tortuosity - mild Calcification - mild Review of the MIP images confirms the above findings. IMPRESSION: 1. Vascular findings and measurements pertinent to potential TAVR procedure, as detailed above. This patient appears to have suitable pelvic arterial access bilaterally, although common iliac and external iliac arteries are rather tortuous. 2. Severe thickening calcification of the aortic valve, compatible with the reported clinical history of severe aortic stenosis. 3. Colonic wall thickening in the region of the cecum, slightly mass-like in appearance. Correlation with nonemergent colonoscopy is recommended in the near future to exclude colonic neoplasm. 4. Multiple small pulmonary nodules in the lungs bilaterally, measuring up to 7 mm in the posterior aspect of the right upper lobe. The majority of these are solid, however, there is a sub solid nodule in the inferior right upper lobe which has a mean diameter of 9 mm with a central 2 mm solid component. Non-contrast chest CT at 3-6 months is recommended. If nodules persist, subsequent management will be based upon the most suspicious nodule(s). This recommendation follows the consensus statement: Guidelines for Management of Incidental Pulmonary Nodules Detected on CT Images:From the Fleischner Society 2017; published online before print (10.1148/radiol.IJ:2314499). 5. Mild thickening of the urinary bladder, most evident near the bladder apex, favored to be related to prior radiation therapy. No discrete bladder wall mass is identified at this time. 6. Mild pericardial thickening and calcification. No morphologic changes in the heart identified at this time to strongly suggest presence of constrictive physiology, but echocardiographic correlation is suggested. 7. Multiple borderline enlarged and minimally enlarged mediastinal and hilar lymph nodes. These are nonspecific.  Attention at time of followup chest CT is recommended. 8. Additional incidental findings, as above. Electronically Signed   By: Vinnie Langton M.D.   On: 01/25/2016 11:09         Discharge Medications:   Medication List    STOP taking these medications        nitroGLYCERIN 0.4 MG SL tablet  Commonly known as:  NITROSTAT      TAKE these medications        acetaminophen 500 MG tablet  Commonly known as:  TYLENOL  Take 500 mg by mouth every 8 (eight) hours as needed for mild pain. Reported on 11/26/2015     albuterol 108 (90 Base) MCG/ACT inhaler  Commonly known as:  PROVENTIL HFA;VENTOLIN HFA  Inhale 1-2 puffs into the lungs at bedtime. Reported on 01/25/2016     aspirin EC 81 MG tablet  Take 81 mg by mouth daily.     buPROPion 100 MG 12 hr tablet  Commonly known as:  WELLBUTRIN SR  Take 100 mg by mouth daily.     clopidogrel 75 MG tablet  Commonly known as:  PLAVIX  Take 1 tablet (75 mg total) by mouth daily.     co-enzyme Q-10 50 MG capsule  Take 50 mg by mouth daily.     ferrous fumarate 325 (106 Fe) MG Tabs tablet  Commonly known as:  HEMOCYTE - 106 mg FE  Take 1 tablet (106 mg of iron total) by mouth daily.     FLUoxetine 10 MG capsule  Commonly known as:  PROZAC  Take 1 capsule (10 mg total) by mouth daily.     Magnesium 250 MG Tabs  Take 250 mg by mouth at bedtime.     metoprolol tartrate 25 MG tablet  Commonly known as:  LOPRESSOR  Take 0.5 tablets (12.5 mg total) by mouth 2 (two) times daily.     rosuvastatin 10 MG tablet  Commonly known as:  CRESTOR  Take 10 mg by mouth at bedtime.       The patient has been discharged on:   1.Beta Blocker:  Yes [ x  ]                              No   [   ]                              If No, reason:  2.Ace Inhibitor/ARB: Yes [   ]                                     No  [  x  ]                                     If No, reason:Labile BP  3.Statin:   Yes [ x  ]                  No  [   ]                   If No, reason:  4.Ecasa:  Yes  [  x ]                  No   [   ]                  If No, reason:  Follow Up Appointments: Follow-up Information    Follow up with Gaye Pollack, MD On 02/29/2016.   Specialty:  Cardiothoracic Surgery   Why:  Appointment time is at 4:00 pm   Contact information:   [place] [address] [phone]       Follow up with [name], MD On 03/20/2016.   Specialty:  Cardiology   Why:  Echo to be done at 9:00 am and appointment time is at 10:00 am   Contact information:   [address] [place] [address] [phone]       Signed: Lars Pinks MPA-C 02/17/2016, 9:10 AM

## 2016-02-17 NOTE — Progress Notes (Addendum)
      [address]       [place] [address]             [phone]        2 Days Post-Op Procedure(s) (LRB): TRANSCATHETER AORTIC VALVE REPLACEMENT, TRANSFEMORAL (N/A) TRANSESOPHAGEAL ECHOCARDIOGRAM (TEE) (N/A)  Subjective: Patient with what he thinks is indigestion this am.  Objective: Vital signs in last 24 hours: Temp:  [97.5 F (36.4 C)-98 F (36.7 C)] 97.7 F (36.5 C) (04/06 0528) Pulse Rate:  [66-92] 92 (04/06 0528) Cardiac Rhythm:  [-] Normal sinus rhythm (04/05 1900) Resp:  [16-18] 18 (04/06 0528) BP: (97-129)/(45-66) 129/66 mmHg (04/06 0528) SpO2:  [96 %-100 %] 99 % (04/06 0528) Weight:  [145 lb 6.4 oz (65.953 kg)] 145 lb 6.4 oz (65.953 kg) (04/06 0528)   Current Weight  02/17/16 145 lb 6.4 oz (65.953 kg)    Hemodynamic parameters for last 24 hours:    Intake/Output from previous day: 04/05 0701 - 04/06 0700 In: 290 [P.O.:240; IV Piggyback:50] Out: 250 [Urine:250]   Physical Exam:  Cardiovascular: RRR, no murmur Pulmonary: Clear to auscultation bilaterally; no rales, wheezes, or rhonchi. Abdomen: Soft, non tender, bowel sounds present. Extremities: No lower extremity edema. Pulses intact. Wounds: Small bandage intact. No hematoma.  Lab Results: CBC: Recent Labs  02/16/16 0247 02/17/16 0232  WBC 6.8 5.4  HGB 10.7* 11.2*  HCT 33.1* 33.4*  PLT 132* 129*   BMET:  Recent Labs  02/16/16 1639 02/17/16 0232  NA 136 139  K 4.2 3.9  CL 102 103  CO2 26 27  GLUCOSE 91 99  BUN 11 10  CREATININE 0.88 0.84  CALCIUM 8.5* 8.7*    PT/INR:  Lab Results  Component Value Date   INR 1.40 02/15/2016   INR 1.19 02/11/2016   INR 1.12 01/05/2016   ABG:  INR: Will add last result for INR, ABG once components are confirmed Will add last 4 CBG results once components are confirmed  Assessment/Plan:  1. CV - SR in the 90's. Not on BB previously secondary to labile BP. BP now improved so will start BB. 2.  Pulmonary - On room  air. 3.  Acute blood loss anemia - H and H stable at 11.2 and 33.4 4. Mild thrombocytopenia-platelelts 129,000 5. Supplement potassium 6. Discharge later today  ZIMMERMAN,DONIELLE MPA-C 02/17/2016,7:59 AM   Chart reviewed, patient examined, agree with above. He has been hemodynamically stable in sinus rhythm. Some mild epigastric discomfort this am that he thinks is indigestion. He is going to get some antacids to see if that helps. I think he can go home today.

## 2016-02-17 NOTE — Progress Notes (Signed)
CARDIAC REHAB PHASE I   PRE:  Rate/Rhythm: 86 SR    BP: sitting 116/74    SaO2: 99 RA  MODE:  Ambulation: 1290 ft   POST:  Rate/Rhythm: 96 SR    BP: sitting 121/57     SaO2: 96 RA  Tolerated very well, no c/o. Sts he had indigestion before walking but seems to be resolving afterward. Gave guidelines to increase walking at home. Pt recently finished CRPII and is not interested in doing it again. He would like to go to the gym and get a Physiological scientist. [place], ACSM 02/17/2016 9:40 AM

## 2016-02-17 NOTE — Progress Notes (Signed)
Patient c/o mild epigastric discomfort/ indigestion. Tx paged PA for orders.

## 2016-02-17 NOTE — Progress Notes (Signed)
TELEMETRY: Reviewed telemetry pt in NSR with rare PVC: Filed Vitals:   02/16/16 1422 02/16/16 2158 02/16/16 2216 02/17/16 0528  BP: 110/45  118/56 129/66  Pulse: 66 74 73 92  Temp: 97.6 F (36.4 C)  98 F (36.7 C) 97.7 F (36.5 C)  TempSrc: Oral  Oral Oral  Resp: 18 18 16 18   Height:      Weight:    65.953 kg (145 lb 6.4 oz)  SpO2: 100%   99%    Intake/Output Summary (Last 24 hours) at 02/17/16 1004 Last data filed at 02/16/16 1400  Gross per 24 hour  Intake    290 ml  Output    250 ml  Net     40 ml   Filed Weights   02/15/16 0847 02/16/16 0500 02/17/16 0528  Weight: 65.601 kg (144 lb 10 oz) 65.9 kg (145 lb 4.5 oz) 65.953 kg (145 lb 6.4 oz)    Subjective Feels well. No chest pain or SOB. Notes some indigestion this am.  . aspirin EC  325 mg Oral Daily  . buPROPion  100 mg Oral Daily  . clopidogrel  75 mg Oral Daily  . FLUoxetine  10 mg Oral Daily  . lactose free nutrition  237 mL Oral TID WC  . metoprolol tartrate  12.5 mg Oral BID  . pantoprazole  40 mg Oral QAC breakfast  . rosuvastatin  10 mg Oral QHS  . sodium chloride flush  3 mL Intravenous Q12H      LABS: Basic Metabolic Panel:  Recent Labs  02/15/16 1941  02/16/16 0247 02/16/16 1639 02/17/16 0232  NA  --   --  137 136 139  K  --   --  3.9 4.2 3.9  CL  --   < > 104 102 103  CO2  --   < > 25 26 27   GLUCOSE  --   --  97 91 99  BUN  --   < > 9 11 10   CREATININE 0.76  --  0.74 0.88 0.84  CALCIUM  --   < > 8.1* 8.5* 8.7*  MG 2.8*  --  2.4  --   --   < > = values in this interval not displayed. Liver Function Tests: No results for input(s): AST, ALT, ALKPHOS, BILITOT, PROT, ALBUMIN in the last 72 hours. No results for input(s): LIPASE, AMYLASE in the last 72 hours. CBC:  Recent Labs  02/16/16 0247 02/17/16 0232  WBC 6.8 5.4  HGB 10.7* 11.2*  HCT 33.1* 33.4*  MCV 91.9 93.6  PLT 132* 129*   Cardiac Enzymes: No results for input(s): CKTOTAL, CKMB, CKMBINDEX, TROPONINI in the last 72  hours. BNP: No results for input(s): PROBNP in the last 72 hours. D-Dimer: No results for input(s): DDIMER in the last 72 hours. Hemoglobin A1C: No results for input(s): HGBA1C in the last 72 hours. Fasting Lipid Panel: No results for input(s): CHOL, HDL, LDLCALC, TRIG, CHOLHDL, LDLDIRECT in the last 72 hours. Thyroid Function Tests: No results for input(s): TSH, T4TOTAL, T3FREE, THYROIDAB in the last 72 hours.  Invalid input(s): FREET3   Radiology/Studies:  Dg Chest Port 1 View  02/16/2016  CLINICAL DATA:  Post transcatheter aortic valve replacement EXAM: PORTABLE CHEST 1 VIEW COMPARISON:  02/15/2016 FINDINGS: Cardiomediastinal silhouette is stable. Again noted status post aortic valve replacement. Right IJ sheath is unchanged in position. No infiltrate or pleural effusion. No pulmonary edema. IMPRESSION: Status post aortic valve replacement. Right IJ sheath is  unchanged in position. No acute infiltrate or pulmonary edema. No pneumothorax. Electronically Signed   By: Lahoma Crocker M.D.   On: 02/16/2016 07:59   Dg Chest Port 1 View  02/15/2016  CLINICAL DATA:  Post transcatheter aortic valve replacement EXAM: PORTABLE CHEST 1 VIEW COMPARISON:  02/11/2016 FINDINGS: Cardiomediastinal silhouette is stable. The patient is status post aortic valve replacement. Right IJ sheath in place is noted with tip in upper SVC. No infiltrate or pulmonary edema. There is no pneumothorax. IMPRESSION: Status post aortic valve replacement. Right IJ sheath in place. No infiltrate or pulmonary edema. Electronically Signed   By: Lahoma Crocker M.D.   On: 02/15/2016 14:19   Echo: 02/16/16:Study Conclusions  - Left ventricle: The cavity size was normal. Systolic function was  normal. The estimated ejection fraction was in the range of 60%  to 65%. Wall motion was normal; there were no regional wall  motion abnormalities. Left ventricular diastolic function  parameters were normal. - Aortic valve: S/P TAVR/ AVR appears  well seated with normal  function and trivial perivalvular leak. Peak velocity (S): 228  cm/s. Mean gradient (S): 12 mm Hg. - Mitral valve: Calcified annulus. Mild focal calcification of the  anterior leaflet (medial segment(s)). There was mild systolic  anterior motion of the chordal structures. - Left atrium: The atrium was mildly dilated. - Tricuspid valve: There was trivial regurgitation.  PHYSICAL EXAM General: Well developed, well nourished, in no acute distress. Head: Normal Neck: Negative for carotid bruits. JVD not elevated. No adenopathy Lungs: Clear  Heart: RRR S1 S2 without murmurs, rubs, or gallops.  Abdomen: Soft, non-tender Extremities: No clubbing, cyanosis or edema.  Distal pedal pulses are 2+ and equal bilaterally. Groin site is OK. Neuro: Alert and oriented X 3. Moves all extremities spontaneously. Psych:  Responds to questions appropriately with a normal affect.  ASSESSMENT AND PLAN: 1. Severe AS s/p TAVR. Normal exam. Echo OK.  Resume ASA and Plavix. 2. CAD s/p stenting of distal RCA. Resume DAPT and statin. 3. Hyperlipidemia. Resume statin. 4. Indigestion: will give Tums this am.  Plan: DC home today.   Present on Admission:  **None**  Signed, Peter [place], [place] 02/17/2016 10:04 AM

## 2016-02-17 NOTE — Discharge Instructions (Signed)
ACTIVITY AND EXERCISE °• Daily activity and exercise are an important part °of your recovery. People recover at different rates °depending on their general health and type of °valve procedure. °• Most people require six to 10 weeks to feel °recovered. °• No lifting, pushing, pulling more than 10 pounds °(examples to avoid: groceries, vacuuming, °gardening, golfing): °- For one week with a procedure through the groin. °- For six weeks for procedures through the chest °wall. °- For three months for procedures through the °breast-bone. °• After the initial healing process of the access site, °we recommend cardiac rehabilitation for all TAVR °patients. Cardiac rehabilitation will help you: °- Rebuild stamina, strength and balance. °- Learn how to participate in activities safely, as well °as help you regain confidence to do so. °- Return to activities of daily living and leisure. °• Discuss attending cardiac rehabilitation at your °follow-up appointment  ° °DRIVING °• Do not drive for four weeks after the date of your °procedure. °• If you have been told by your doctor in the past °that you may not drive, you must talk with him/her °before you begin driving again. °• When you resume driving, you must have someone °with you. ° °HYGIENE °If you had a femoral (leg) procedure, you may take a shower when you return home. After the shower, pat the °site dry. Do NOT use powder, oils or lotions in your groin area until the site has completely healed. °• If you had a chest procedure, you may shower when you return home unless specifically instructed not to by °your discharging practitioner. °- DO NOT scrub incision; pat dry with a towel °- DO NOT apply any lotions, oils, powders to the incision °- No tub baths / swimming for at least six weeks. ° °SITE CARE °• You likely will have small openings in both groins °from catheters used during the procedure. If you °had a transfemoral procedure, one groin will have a °larger opening  and may be bruised or tender. °• If you had a chest procedure, you will have either a °small incision in your upper sternum (breast-bone) °or between your ribs on your left side. °- Chest wall site: The surgical incision should be °kept dry (no lotions / oils / powders) and open °to air. If you experience irritation from clothing °rubbing on the incision, a light gauze dressing °may be applied. °- Inspect your incision daily; notify your °physician if there is increased redness, swelling °or drainage from the incision. °- If the incision is located on your breast-bone °you must avoid lifting objects heavier than a °gallon of milk (eight pounds) and stretching / °twisting / pulling with your arms for at least °three months to ensure strong bone healing. ° ° °CONTACT [phone] °• Check your sites daily. Contact our office if you have °any of the following problems: °- Redness and warmth that does not go away °- Yellow or green drainage from the wound °- Fever and chills °- Increasing numbness in your legs °- Worsening pain at the site °• If you had a leg/groin procedure, it is normal to °have bruising or a soft lump at the site. It is not °normal if the lump suddenly becomes larger or °more firm. This may mean you are bleeding. If this °happens: °- Lie down °- Have someone press down hard, just above °the hole in your skin where the procedure was °performed for 15 minutes. If after holding on the °site, the lump does not become larger or harder, °they   are performing this correctly. °- If the bleeding has stopped after 15 minutes, rest °and stay laying down for at least two hours. °- If the bleeding continues, call 911 for an °ambulance. Do NOT drive yourself or have °someone else drive you. °

## 2016-02-18 ENCOUNTER — Encounter: Payer: Self-pay | Admitting: Cardiovascular Disease

## 2016-02-18 ENCOUNTER — Other Ambulatory Visit: Payer: Self-pay

## 2016-02-18 NOTE — Progress Notes (Signed)
I was notified that the patient had called in to the cardiac surgery office with chest pain. I called him and we discussed his symptoms over the telephone. He was just discharged from the hospital yesterday after undergoing TAVR. He complains of chest pain worse with eating and also worse with deep breaths or coughing. He has had a lot of chest pain over the last year on and off with emergency room evaluations and even observation stays in the hospital. He's taken nitroglycerin today 3 times without improvement in his symptoms. He otherwise denies shortness of breath or other complaints. I advised that he try Tums or Maalox as needed as his symptoms are most likely GI related. I did review all of his postoperative studies, including his echocardiogram , EKG, chest x-ray, and lab work, all of which looked good. He understands that he should go to the emergency room if symptoms worsen or if he develops typical symptoms of angina.  [name] 02/18/2016 1:33 PM

## 2016-02-18 NOTE — Patient Outreach (Signed)
[facility]) Care Management  02/18/2016  OJAS SALTON March 03, 1936 [id number]  Subjective: "I am having chest pain across the bone portion. I have taken 2-3 nitrogycerin pills since I have been home and I am uncomfortable with it."  Assessment: RNCM called to complete transition of care call. Member reports he was just getting ready to call cardiac surgeon's office/Ryan because he has been having some chest pain. Member reports he had this pain yesterday before he left the hospital, but feels it is worse now because he has taken more nitroglycerin pills then he has before.[name] RNCM encouraged member to call to notify them of the continued pain.   Plan: RNCM will call cardiac surgeon's office also. Follow up transition of care next week.  Thea Silversmith, RN, MSN, [place] Coordinator Cell: [phone]

## 2016-02-19 LAB — TYPE AND SCREEN
ABO/RH(D): O POS
Antibody Screen: NEGATIVE
UNIT DIVISION: 0
Unit division: 0

## 2016-02-20 ENCOUNTER — Telehealth: Payer: Self-pay | Admitting: Cardiology

## 2016-02-20 ENCOUNTER — Emergency Department (HOSPITAL_COMMUNITY)
Admission: EM | Admit: 2016-02-20 | Discharge: 2016-02-20 | Disposition: A | Discharge status: Home / Self Care | Payer: Medicare Other | Attending: Emergency Medicine | Admitting: Emergency Medicine

## 2016-02-20 ENCOUNTER — Emergency Department (HOSPITAL_COMMUNITY): Payer: Medicare Other

## 2016-02-20 ENCOUNTER — Encounter (HOSPITAL_COMMUNITY): Payer: Self-pay | Admitting: Emergency Medicine

## 2016-02-20 DIAGNOSIS — K219 Gastro-esophageal reflux disease without esophagitis: Secondary | ICD-10-CM | POA: Diagnosis not present

## 2016-02-20 DIAGNOSIS — Z7902 Long term (current) use of antithrombotics/antiplatelets: Secondary | ICD-10-CM | POA: Diagnosis not present

## 2016-02-20 DIAGNOSIS — Z79899 Other long term (current) drug therapy: Secondary | ICD-10-CM | POA: Insufficient documentation

## 2016-02-20 DIAGNOSIS — Z7982 Long term (current) use of aspirin: Secondary | ICD-10-CM | POA: Diagnosis not present

## 2016-02-20 DIAGNOSIS — R072 Precordial pain: Secondary | ICD-10-CM | POA: Insufficient documentation

## 2016-02-20 DIAGNOSIS — F329 Major depressive disorder, single episode, unspecified: Secondary | ICD-10-CM | POA: Insufficient documentation

## 2016-02-20 DIAGNOSIS — E785 Hyperlipidemia, unspecified: Secondary | ICD-10-CM | POA: Diagnosis not present

## 2016-02-20 DIAGNOSIS — I25119 Atherosclerotic heart disease of native coronary artery with unspecified angina pectoris: Secondary | ICD-10-CM | POA: Insufficient documentation

## 2016-02-20 DIAGNOSIS — R011 Cardiac murmur, unspecified: Secondary | ICD-10-CM | POA: Diagnosis not present

## 2016-02-20 DIAGNOSIS — R079 Chest pain, unspecified: Secondary | ICD-10-CM | POA: Diagnosis present

## 2016-02-20 DIAGNOSIS — Z8546 Personal history of malignant neoplasm of prostate: Secondary | ICD-10-CM | POA: Diagnosis not present

## 2016-02-20 DIAGNOSIS — R0789 Other chest pain: Secondary | ICD-10-CM

## 2016-02-20 DIAGNOSIS — Z87891 Personal history of nicotine dependence: Secondary | ICD-10-CM | POA: Diagnosis not present

## 2016-02-20 LAB — HEPATIC FUNCTION PANEL
ALBUMIN: 3.5 g/dL (ref 3.5–5.0)
ALT: 18 U/L (ref 17–63)
AST: 26 U/L (ref 15–41)
Alkaline Phosphatase: 76 U/L (ref 38–126)
BILIRUBIN DIRECT: 0.1 mg/dL (ref 0.1–0.5)
BILIRUBIN TOTAL: 0.7 mg/dL (ref 0.3–1.2)
Indirect Bilirubin: 0.6 mg/dL (ref 0.3–0.9)
Total Protein: 6.6 g/dL (ref 6.5–8.1)

## 2016-02-20 LAB — CBC
HCT: 35.6 % — ABNORMAL LOW (ref 39.0–52.0)
HEMOGLOBIN: 11.9 g/dL — AB (ref 13.0–17.0)
MCH: 30.7 pg (ref 26.0–34.0)
MCHC: 33.4 g/dL (ref 30.0–36.0)
MCV: 91.8 fL (ref 78.0–100.0)
PLATELETS: 185 10*3/uL (ref 150–400)
RBC: 3.88 MIL/uL — AB (ref 4.22–5.81)
RDW: 14.3 % (ref 11.5–15.5)
WBC: 5 10*3/uL (ref 4.0–10.5)

## 2016-02-20 LAB — BASIC METABOLIC PANEL
ANION GAP: 9 (ref 5–15)
BUN: 12 mg/dL (ref 6–20)
CALCIUM: 9.2 mg/dL (ref 8.9–10.3)
CO2: 27 mmol/L (ref 22–32)
CREATININE: 0.93 mg/dL (ref 0.61–1.24)
Chloride: 101 mmol/L (ref 101–111)
Glucose, Bld: 98 mg/dL (ref 65–99)
Potassium: 4 mmol/L (ref 3.5–5.1)
SODIUM: 137 mmol/L (ref 135–145)

## 2016-02-20 LAB — I-STAT TROPONIN, ED: TROPONIN I, POC: 0 ng/mL (ref 0.00–0.08)

## 2016-02-20 LAB — LIPASE, BLOOD: LIPASE: 27 U/L (ref 11–51)

## 2016-02-20 MED ORDER — HYDROCODONE-ACETAMINOPHEN 5-325 MG PO TABS: 1.0000 | ORAL_TABLET | ORAL | Status: DC | PRN

## 2016-02-20 MED ORDER — FENTANYL CITRATE (PF) 100 MCG/2ML IJ SOLN
50.0000 ug | Freq: Once | INTRAMUSCULAR | Status: AC
  Administered 2016-02-20: 50 ug via INTRAVENOUS
  Filled 2016-02-20: qty 2

## 2016-02-20 MED ORDER — SODIUM CHLORIDE 0.9 % IV BOLUS (SEPSIS)
500.0000 mL | Freq: Once | INTRAVENOUS | Status: AC
  Administered 2016-02-20: 500 mL via INTRAVENOUS

## 2016-02-20 MED ORDER — PANTOPRAZOLE SODIUM 40 MG IV SOLR
40.0000 mg | Freq: Once | INTRAVENOUS | Status: AC
  Administered 2016-02-20: 40 mg via INTRAVENOUS
  Filled 2016-02-20: qty 40

## 2016-02-20 NOTE — Telephone Encounter (Signed)
Pt called with chest pain, more so after meals but NTG is only thing that helped recent TAVR with D/c 02/17/16.  Instructed to go to ER for evaluation.

## 2016-02-20 NOTE — ED Notes (Signed)
Patient verbalized understanding of discharge instructions and denies any further needs or questions at this time. VS stable. Patient ambulatory with steady gait.  

## 2016-02-20 NOTE — ED Notes (Signed)
Pt sts substernal CP x 5 days since having valve replacement; pt sts worse after he eats and with exercise

## 2016-02-20 NOTE — ED Notes (Signed)
MD at bedside. 

## 2016-02-20 NOTE — ED Notes (Signed)
Patient ambulatory to restroom with steady gait, states his pain has improved - see pain reassessment.

## 2016-02-20 NOTE — Discharge Instructions (Signed)
Tests show no life-threatening condition. Medication for pain. Follow-up your primary care doctor.

## 2016-02-20 NOTE — ED Provider Notes (Signed)
CSN: [id number]     Arrival date & time 02/20/16  1546 History   First MD Initiated Contact with Patient 02/20/16 1608     Chief Complaint  Patient presents with  . Chest Pain     (Consider location/radiation/quality/duration/timing/severity/associated sxs/prior Treatment) HPI...Marland KitchenMarland KitchenStatus post aortic valve replacement [TAVR] via the right femoral approach on 02/15/16 by Dr. Cyndia Bent and Dr. Burt Knack. He now complains of pain in his lower sternum. Pain is worse with eating. No dyspnea, diaphoresis, nausea, vomiting, substernal chest pain. He is ambulatory at home. No previous history of GERD, esophagitis, gastritis.  Past Medical History  Diagnosis Date  . Hyperlipidemia   . Esophageal reflux   . Hx of radiation therapy 04/14/13-06/09/13    prostate 7800 cGy, 40 sessions, seminal vesicles 5600 cGy 40 sessions  . CAD (coronary artery disease)     a.  LHC 8/16: Mid to distal LAD 30%, OM1 40%, proximal mid RCA 40%, distal RCA 60% >> FFR 0.69  >> PCI: 3 x 15 mm Resolute DES  . Aortic stenosis     a. peak to peak gradient by LHC 8/16:  37 mmHg (moderately severe)   . Radiation proctitis   . Family history of adverse reaction to anesthesia     "daughter has PONV"  . Heart murmur   . Anginal pain ([place])   . H/O blood clots     "get them in my stool and urine" (11/12/2015)  . Depression   . Prostate cancer [facility]) dx'd 2014   Past Surgical History  Procedure Laterality Date  . Nasal fracture surgery      "broken years ago; several ORs to correct it"  . Tonsillectomy    . Prostate biopsy  2014    "needle biopsy"  . Colonoscopy    . Cardiac catheterization N/A 04/21/2015    Procedure: Right/Left Heart Cath and Coronary Angiography;  Surgeon: [name], MD;  Location: [place] [facility];  Service: Cardiovascular;  Laterality: N/A;  . Cardiac catheterization N/A 06/18/2015    Procedure: Intravascular Pressure Wire/FFR Study;  Surgeon: [name], MD;  Location: [place] [facility];  Service:  Cardiovascular;  Laterality: N/A;  . Cardiac catheterization N/A 06/18/2015    Procedure: Coronary Stent Intervention;  Surgeon: [name], MD;  Location: [place] [facility];  Service: Cardiovascular;  Laterality: N/A;  . Cardiac catheterization N/A 06/18/2015    Procedure: Right/Left Heart Cath and Coronary Angiography;  Surgeon: [name], MD;  Location: [place] [facility];  Service: Cardiovascular;  Laterality: N/A;  . Cardiac catheterization N/A 06/23/2015    Procedure: Left Heart Cath and Cors/Grafts Angiography;  Surgeon: [name], MD;  Location: [place] [facility];  Service: Cardiovascular;  Laterality: N/A;  . Nasal septum surgery    . Inguinal hernia repair    . Fracture surgery    . Laparoscopic appendectomy N/A 11/16/2015    Procedure: APPENDECTOMY LAPAROSCOPIC;  Surgeon: [name], MD;  Location: [place];  Service: General;  Laterality: N/A;  . Cardiac catheterization N/A 01/17/2016    Procedure: Right/Left Heart Cath and Coronary Angiography;  Surgeon: [name], MD;  Location: [place] [facility];  Service: Cardiovascular;  Laterality: N/A;  . Transcatheter aortic valve replacement, transfemoral  02/15/2016  . Transcatheter aortic valve replacement, transfemoral N/A 02/15/2016    Procedure: TRANSCATHETER AORTIC VALVE REPLACEMENT, TRANSFEMORAL;  Surgeon: [name], MD;  Location: [place];  Service: Open Heart Surgery;  Laterality: N/A;  . Tee without cardioversion  N/A 02/15/2016    Procedure: TRANSESOPHAGEAL ECHOCARDIOGRAM (TEE);  Surgeon: [name], MD;  Location: [place];  Service: Open Heart Surgery;  Laterality: N/A;   Family History  Problem Relation Age of Onset  . Brain cancer Father   . Depression Daughter   . Lymphoma Mother    Social History  Substance Use Topics  . Smoking status: Former Smoker -- 0.00 packs/day    Types: Cigarettes  . Smokeless tobacco: Never Used     Comment: "quit smoking cigarettes in the 1960s; quit smoking cigars in the 1990s   . Alcohol Use: Yes     Comment: 2 DRINKS PER DAY    Review of Systems  All other systems reviewed and are negative.     Allergies  Review of patient's allergies indicates no known allergies.  Home Medications   Prior to Admission medications   Medication Sig Start Date End Date Taking? Authorizing Provider  acetaminophen (TYLENOL) 500 MG tablet Take 500 mg by mouth every 8 (eight) hours as needed for mild pain. Reported on 11/26/2015   Yes Historical Provider, MD  aspirin EC 81 MG tablet Take 81 mg by mouth daily.   Yes Historical Provider, MD  buPROPion (WELLBUTRIN SR) 100 MG 12 hr tablet Take 100 mg by mouth daily.    Yes Historical Provider, MD  clopidogrel (PLAVIX) 75 MG tablet Take 1 tablet (75 mg total) by mouth daily. 01/05/16  Yes [name], MD  ferrous fumarate (HEMOCYTE - 106 MG FE) 325 (106 FE) MG TABS tablet Take 1 tablet (106 mg of iron total) by mouth daily. Patient taking differently: Take 1 tablet by mouth at bedtime.  07/26/15  Yes Burtis Junes, NP  FLUoxetine (PROZAC) 10 MG capsule Take 1 capsule (10 mg total) by mouth daily. 03/22/15  Yes [name], MD  Magnesium 250 MG TABS Take 250 mg by mouth at bedtime.    Yes Historical Provider, MD  rosuvastatin (CRESTOR) 10 MG tablet Take 10 mg by mouth at bedtime.   Yes Historical Provider, MD  HYDROcodone-acetaminophen (NORCO/VICODIN) 5-325 MG tablet Take 1 tablet by mouth every 4 (four) hours as needed. 02/20/16   Nat Christen, MD  metoprolol tartrate (LOPRESSOR) 25 MG tablet Take 0.5 tablets (12.5 mg total) by mouth 2 (two) times daily. 02/17/16   Donielle Liston Alba, PA-C   BP 138/74 mmHg  Pulse 78  Temp(Src) 97.7 F (36.5 C) (Oral)  Resp 15  SpO2 100% Physical Exam  Constitutional: He is oriented to person, place, and time. He appears well-developed and well-nourished.  HENT:  Head: Normocephalic and atraumatic.  Eyes: Conjunctivae and EOM are normal. Pupils are equal, round, and reactive to light.  Neck:  Normal range of motion. Neck supple.  Cardiovascular: Normal rate and regular rhythm.   Pulmonary/Chest: Effort normal and breath sounds normal.  Tender lower sternum  Abdominal: Soft. Bowel sounds are normal.  Musculoskeletal: Normal range of motion.  Neurological: He is alert and oriented to person, place, and time.  Skin: Skin is warm and dry.  Psychiatric: He has a normal mood and affect. His behavior is normal.  Nursing note and vitals reviewed.   ED Course  Procedures (including critical care time) Labs Review Labs Reviewed  CBC - Abnormal; Notable for the following:    RBC 3.88 (*)    Hemoglobin 11.9 (*)    HCT 35.6 (*)    All other components within normal limits  BASIC METABOLIC PANEL  HEPATIC FUNCTION PANEL  LIPASE, BLOOD  I-STAT TROPOININ, ED    Imaging Review Dg Chest 2 View  02/20/2016  CLINICAL DATA:  Patient with sub sternal chest pain for 5 days. History of recent aortic valve replacement. EXAM: CHEST  2 VIEW COMPARISON:  Chest radiograph 02/16/2016 FINDINGS: Stable cardiac and mediastinal contours. Stable calcified left hilar lymph nodes. Aortic valve replacement demonstrated within the central aspect of the mediastinum. Lungs are clear. No pleural effusion or pneumothorax. Mid thoracic spine degenerative changes. IMPRESSION: No acute cardiopulmonary process. Electronically Signed   By: Lovey Newcomer M.D.   On: 02/20/2016 16:27   I have personally reviewed and evaluated these images and lab results as part of my medical decision-making.   EKG Interpretation   Date/Time:  Sunday February 20 2016 15:54:19 EDT Ventricular Rate:  81 PR Interval:  168 QRS Duration: 78 QT Interval:  384 QTC Calculation: 446 R Axis:   83 Text Interpretation:  Normal sinus rhythm Biatrial enlargement Abnormal  ECG Confirmed by Evelen Vazguez  MD, Martavious Hartel ([id number]) on 02/20/2016 5:10:00 PM      MDM   Final diagnoses:  Sternal pain    Patient appears well. He is tender over the lower sternum.  Screening labs including CBC, chemistry panel, lipase, troponin, chest x-ray, EKG all normal. Discharge medication Vicodin. Follow-up with primary care. Discussed with patient and his wife.    Nat Christen, MD 02/20/16 2014

## 2016-02-20 NOTE — ED Notes (Addendum)
Patient d/c Thursday from [facility] following aortic valve surgery. Pt states he has had discomfort in his chest since that becomes more painful intermittently. MD at bedside.

## 2016-02-21 LAB — POCT I-STAT, CHEM 8
BUN: 13 mg/dL (ref 6–20)
BUN: 13 mg/dL (ref 6–20)
BUN: 13 mg/dL (ref 6–20)
CHLORIDE: 101 mmol/L (ref 101–111)
CHLORIDE: 102 mmol/L (ref 101–111)
CREATININE: 0.5 mg/dL — AB (ref 0.61–1.24)
Calcium, Ion: 1.17 mmol/L (ref 1.13–1.30)
Calcium, Ion: 1.11 mmol/L — ABNORMAL LOW (ref 1.13–1.30)
Calcium, Ion: 1.18 mmol/L (ref 1.13–1.30)
Chloride: 101 mmol/L (ref 101–111)
Creatinine, Ser: 0.6 mg/dL — ABNORMAL LOW (ref 0.61–1.24)
Creatinine, Ser: 0.6 mg/dL — ABNORMAL LOW (ref 0.61–1.24)
Glucose, Bld: 89 mg/dL (ref 65–99)
Glucose, Bld: 115 mg/dL — ABNORMAL HIGH (ref 65–99)
Glucose, Bld: 116 mg/dL — ABNORMAL HIGH (ref 65–99)
HEMATOCRIT: 34 % — AB (ref 39.0–52.0)
HEMATOCRIT: 30 % — AB (ref 39.0–52.0)
HEMATOCRIT: 32 % — AB (ref 39.0–52.0)
HEMOGLOBIN: 11.6 g/dL — AB (ref 13.0–17.0)
Hemoglobin: 10.2 g/dL — ABNORMAL LOW (ref 13.0–17.0)
Hemoglobin: 10.9 g/dL — ABNORMAL LOW (ref 13.0–17.0)
POTASSIUM: 4 mmol/L (ref 3.5–5.1)
POTASSIUM: 4 mmol/L (ref 3.5–5.1)
Potassium: 4 mmol/L (ref 3.5–5.1)
SODIUM: 136 mmol/L (ref 135–145)
SODIUM: 137 mmol/L (ref 135–145)
Sodium: 137 mmol/L (ref 135–145)
TCO2: 29 mmol/L (ref 0–100)
TCO2: 26 mmol/L (ref 0–100)
TCO2: 27 mmol/L (ref 0–100)

## 2016-02-21 LAB — POCT I-STAT 3, ART BLOOD GAS (G3+)
Acid-base deficit: 1 mmol/L (ref 0.0–2.0)
Bicarbonate: 26.3 mEq/L — ABNORMAL HIGH (ref 20.0–24.0)
O2 SAT: 100 %
PCO2 ART: 58.3 mmHg — AB (ref 35.0–45.0)
PO2 ART: 258 mmHg — AB (ref 80.0–100.0)
TCO2: 28 mmol/L (ref 0–100)
pH, Arterial: 7.261 — ABNORMAL LOW (ref 7.350–7.450)

## 2016-02-21 MED FILL — Insulin Regular (Human) Inj 100 Unit/ML: INTRAMUSCULAR | Qty: 250 | Status: AC

## 2016-02-24 ENCOUNTER — Other Ambulatory Visit: Payer: Self-pay

## 2016-02-24 NOTE — Patient Outreach (Signed)
[facility] [place] [facility]) Care Management  02/24/2016  Corey Oneill 1936/08/22 [id number]  Subjective: "It is going well. My appetite is picking up. I really feel pretty good. The pain seems to be going away. It is no where near where it was".  Transition of care call. Member reports feeling better. Member reports the pain he was having in his chest last week has gotten better. It is down from 8-9 to around a 3. Member with no concerns or issues at this time.  Plan: home visit within the next 1-2 weeks.  Thea Silversmith, RN, MSN, [place] Coordinator Cell: [phone]

## 2016-02-29 ENCOUNTER — Ambulatory Visit (INDEPENDENT_AMBULATORY_CARE_PROVIDER_SITE_OTHER): Payer: Medicare Other | Admitting: Surgery

## 2016-02-29 ENCOUNTER — Encounter: Payer: Self-pay | Admitting: Surgery

## 2016-02-29 VITALS — BP 128/74 | HR 79 | Resp 20 | Ht 70.5 in | Wt 145.0 lb

## 2016-02-29 DIAGNOSIS — Z954 Presence of other heart-valve replacement: Secondary | ICD-10-CM

## 2016-02-29 DIAGNOSIS — I208 Other forms of angina pectoris: Secondary | ICD-10-CM | POA: Diagnosis not present

## 2016-02-29 DIAGNOSIS — Z952 Presence of prosthetic heart valve: Secondary | ICD-10-CM

## 2016-02-29 NOTE — Progress Notes (Signed)
     HPI: Patient returns for routine postoperative follow-up having undergone right transfemoral TAVR  on 02/15/2016. The patient's early postoperative recovery while in the hospital was notable for an uncomplicated postop course. Since hospital discharge the patient reports that he was having some chest pain after eating. He had an episode while in the hospital that resolved quickly. He had a severe episode last Sunday while eating and went to the ER. His work up was negative and the pain resolved. He has not had any further episodes since then and feels well. He is walking daily without chest pain or shortness of breath.   Current Outpatient Prescriptions  Medication Sig Dispense Refill  . acetaminophen (TYLENOL) 500 MG tablet Take 500 mg by mouth every 8 (eight) hours as needed for mild pain. Reported on 11/26/2015    . aspirin EC 81 MG tablet Take 81 mg by mouth daily.    [name] buPROPion (WELLBUTRIN SR) 100 MG 12 hr tablet Take 100 mg by mouth daily.     . clopidogrel (PLAVIX) 75 MG tablet Take 1 tablet (75 mg total) by mouth daily. 90 tablet 3  . ferrous fumarate (HEMOCYTE - 106 MG FE) 325 (106 FE) MG TABS tablet Take 1 tablet (106 mg of iron total) by mouth daily. (Patient taking differently: Take 1 tablet by mouth at bedtime. ) 30 each 0  . FLUoxetine (PROZAC) 10 MG capsule Take 1 capsule (10 mg total) by mouth daily. 90 capsule 3  . Magnesium 250 MG TABS Take 250 mg by mouth at bedtime.     . metoprolol tartrate (LOPRESSOR) 25 MG tablet Take 0.5 tablets (12.5 mg total) by mouth 2 (two) times daily. 30 tablet 1  . rosuvastatin (CRESTOR) 10 MG tablet Take 10 mg by mouth at bedtime.     No current facility-administered medications for this visit.    Physical Exam: BP 128/74 mmHg  Pulse 79  Resp 20  Ht 5' 10.5" (1.791 m)  Wt 145 lb (65.772 kg)  BMI 20.50 kg/m2  SpO2 97% He looks well. Lung exam is clear. Cardiac exam shows a regular rate and rhythm with normal heart sounds. His groin  sites look good.    Impression:  He is doing well following TAVR and feels better. It is not clear what was causing his pain but it sounded like it was related to eating and I wonder if he had some esophagitis from the TEE. It seems to have resolved. He did had a stent placed last summer and remains on ASA and Plavix for this and his valve. I encouraged him to continue ambulating and to attend cardiac rehab. He has returned to driving.  Plan:  He will have an echo on 5/8 and will see Dr. Burt Knack at that time. He will let us know if he has any further episodes of chest pain.   Gaye Pollack, MD [facility] [phone]

## 2016-03-03 ENCOUNTER — Other Ambulatory Visit: Payer: Self-pay

## 2016-03-03 NOTE — Patient Outreach (Signed)
[facility]) Care Management  [place]  03/03/2016   Corey Oneill 21-Feb-1936 [id number]  Subjective: member reports he had some pain right after he got home from the hospital, but reports the pain is gone now.    Objective: BP 100/50 mmHg  Pulse 54  Resp 18  Ht 1.778 m (5\' 10" )  Wt 140 lb (63.504 kg)  BMI 20.09 kg/m2  SpO2 97%, standing blood pressure after 3 minutes 102/58 Heart rate 55, lungs clear,heart rate regular   Encounter Medications:  Outpatient Encounter Prescriptions as of 03/03/2016  Medication Sig Note  . acetaminophen (TYLENOL) 500 MG tablet Take 500 mg by mouth every 8 (eight) hours as needed for mild pain. Reported on 11/26/2015   . aspirin EC 81 MG tablet Take 81 mg by mouth daily.   [name] buPROPion (WELLBUTRIN SR) 100 MG 12 hr tablet Take 100 mg by mouth daily.    . clopidogrel (PLAVIX) 75 MG tablet Take 1 tablet (75 mg total) by mouth daily.   . ferrous fumarate (HEMOCYTE - 106 MG FE) 325 (106 FE) MG TABS tablet Take 1 tablet (106 mg of iron total) by mouth daily. (Patient taking differently: Take 1 tablet by mouth at bedtime. )   . FLUoxetine (PROZAC) 10 MG capsule Take 1 capsule (10 mg total) by mouth daily.   . Magnesium 250 MG TABS Take 250 mg by mouth at bedtime.    . metoprolol tartrate (LOPRESSOR) 25 MG tablet Take 0.5 tablets (12.5 mg total) by mouth 2 (two) times daily. 02/20/2016: Pt has picked up but needs more information before he starts taking at home  . rosuvastatin (CRESTOR) 10 MG tablet Take 10 mg by mouth at bedtime.    No facility-administered encounter medications on file as of 03/03/2016.    Functional Status:  In your present state of health, do you have any difficulty performing the following activities: 03/03/2016 02/24/2016  Hearing? [place]? N -  Difficulty concentrating or making decisions? N -  Walking or climbing stairs? N -  Dressing or bathing? N -  Doing errands, shopping? N -  Preparing Food and eating  ? N N  Using the Toilet? N N  In the past six months, have you accidently leaked urine? N N  Do you have problems with loss of bowel control? N N  Managing your Medications? N N  Managing your Finances? N N  Housekeeping or managing your Housekeeping? N N    Fall/Depression Screening: PHQ 2/9 Scores 03/03/2016 09/24/2015 03/22/2015 09/29/2013  PHQ - 2 Score 1 1 6 2   PHQ- 9 Score - - 15 -   Fall Risk  03/03/2016 03/22/2015 09/29/2013  Falls in the past year? Yes Yes No  Number falls in past yr: 1 2 or more -  Injury with Fall? Yes Yes -  Risk for fall due to : History of fall(s) - -  Follow up Education provided;Falls prevention discussed Falls prevention discussed -   Assessment: 80 year old with recent valve replacement. Member denies specific complaints at this time.   Blood pressure a little lower than noted in chart, however cuff used was large size. Member denies dizziness and not orthostatic.100/50 heart rate 55; standing after 3 minutes 102/58 heart rate 54. RNCM discussed automatic cuff to monitor blood pressure and reinforced stores where member could purchase one.  Medications reviewed-member takes medications as ordered.  Fall risk: history of fall approximately 6 months ago. Member denies dizziness  at this time. Member reports if he changes positions too fast he sometimes feels loss of balance. Member reports his providers are aware. RNCM discussed physical therapy. Member reports he has attended cardiac rehabilitation in the past. RNCM discussed Provider referral exercise program. Member is aware and states he will notify provider if he feels he needs any additional referral.  Plan: telephonic follow up transition of care call next week, update primary care and cardiologist.  Thea Silversmith, RN, MSN, [employer] Coordinator Cell: [phone]

## 2016-03-06 ENCOUNTER — Telehealth: Payer: Self-pay | Admitting: Cardiovascular Disease

## 2016-03-06 ENCOUNTER — Other Ambulatory Visit: Payer: Self-pay

## 2016-03-06 NOTE — Telephone Encounter (Signed)
Ok thx. Please advise to STOP metoprolol to allow a little higher BP/HR. thx

## 2016-03-06 NOTE — Patient Outreach (Signed)
[facility]) Care Management  03/06/2016  Corey Oneill 09-May-1936 [id number]   Assessment: 80 year old with recent transcatheter aortic valve replacement. Member denies any issues. Denies dizziness over the weekend, but reports he has been having some Balance issues. RNCM reinforced minimizing sudden movements and slow movement with change in positions.   Plan: transition of care call next week.  Thea Silversmith, RN, MSN, [place] Coordinator Cell: [phone]

## 2016-03-06 NOTE — Telephone Encounter (Signed)
New Message:   She saw pt on Friday,blood pressure was 100/50 and heart rate was 55. She use the large cuff,her other one was not working.Standing his blood pressure was 102/58 and heart rate was 54. Pt denied dizziness,have balance issues. These readings were a little low for him,wanted this be bought to your attention. Her encounter note was sent on 03-03-16.Please call if you have any questions.

## 2016-03-07 NOTE — Telephone Encounter (Signed)
I spoke with the pt and made him aware that he needs to stop metoprolol tartrate at this time.  The pt has a pending appointment with Dr Burt Knack 03/20/16.

## 2016-03-14 ENCOUNTER — Other Ambulatory Visit: Payer: Self-pay

## 2016-03-14 NOTE — Patient Outreach (Signed)
[facility]) Care Management  03/14/2016  KANI MACKO 19-Oct-1936 [id number]  Transition of care call. No answer. HIPPA compliant message left.  Plan: await return call and follow up next week if no return call.   Thea Silversmith, RN, MSN, [place] Coordinator Cell: [phone]

## 2016-03-20 ENCOUNTER — Other Ambulatory Visit: Payer: Self-pay

## 2016-03-20 ENCOUNTER — Ambulatory Visit (INDEPENDENT_AMBULATORY_CARE_PROVIDER_SITE_OTHER): Payer: Medicare Other | Admitting: Cardiovascular Disease

## 2016-03-20 ENCOUNTER — Encounter: Payer: Self-pay | Admitting: Cardiovascular Disease

## 2016-03-20 ENCOUNTER — Ambulatory Visit (HOSPITAL_COMMUNITY): Payer: Medicare Other | Attending: Internal Medicine

## 2016-03-20 VITALS — BP 150/66 | HR 72 | Ht 70.0 in | Wt 148.8 lb

## 2016-03-20 DIAGNOSIS — I517 Cardiomegaly: Secondary | ICD-10-CM | POA: Insufficient documentation

## 2016-03-20 DIAGNOSIS — I208 Other forms of angina pectoris: Secondary | ICD-10-CM | POA: Diagnosis not present

## 2016-03-20 DIAGNOSIS — I350 Nonrheumatic aortic (valve) stenosis: Secondary | ICD-10-CM | POA: Diagnosis not present

## 2016-03-20 DIAGNOSIS — E785 Hyperlipidemia, unspecified: Secondary | ICD-10-CM | POA: Diagnosis not present

## 2016-03-20 DIAGNOSIS — I359 Nonrheumatic aortic valve disorder, unspecified: Secondary | ICD-10-CM | POA: Diagnosis not present

## 2016-03-20 DIAGNOSIS — Z954 Presence of other heart-valve replacement: Secondary | ICD-10-CM | POA: Diagnosis not present

## 2016-03-20 DIAGNOSIS — Z952 Presence of prosthetic heart valve: Secondary | ICD-10-CM

## 2016-03-20 DIAGNOSIS — Z953 Presence of xenogenic heart valve: Secondary | ICD-10-CM | POA: Diagnosis not present

## 2016-03-20 DIAGNOSIS — Z87891 Personal history of nicotine dependence: Secondary | ICD-10-CM | POA: Insufficient documentation

## 2016-03-20 DIAGNOSIS — I351 Nonrheumatic aortic (valve) insufficiency: Secondary | ICD-10-CM | POA: Insufficient documentation

## 2016-03-20 NOTE — Progress Notes (Signed)
Cardiology Office Note Date:  03/20/2016   ID:  Corey Oneill, DOB [birth year], MRN [id number]  PCP:  [name], MD  Cardiologist:  [name], MD    Chief Complaint  Patient presents with  . Aortic Stenosis    c/o "discomfort" in chest relieved by nitro. denies sob,lee,or claudication  . Coronary Artery Disease     History of Present Illness: Corey Oneill is a 80 y.o. male who presents for follow-up evaluation. He underwent TAVR on 02/15/2016 via a percutaneous right transfemoral approach. He had an uncomplicated early postoperative course.  The patient is doing well. He has had some problems with his balance. Otherwise has no shortness of breath, orthopnea, PND, leg swelling, heart palpitations, lightheadedness, or syncope. He has had a few episodes of chest pain at a been unrelated to physical exertion. He's taken nitroglycerin for this. The pain is left-sided and atypical in nature. He's tolerating dual antiplatelet therapy and has not had any further rectal bleeding.   Past Medical History  Diagnosis Date  . Hyperlipidemia   . Esophageal reflux   . Hx of radiation therapy 04/14/13-06/09/13    prostate 7800 cGy, 40 sessions, seminal vesicles 5600 cGy 40 sessions  . CAD (coronary artery disease)     a.  LHC 8/16: Mid to distal LAD 30%, OM1 40%, proximal mid RCA 40%, distal RCA 60% >> FFR 0.69  >> PCI: 3 x 15 mm Resolute DES  . Aortic stenosis     a. peak to peak gradient by LHC 8/16:  37 mmHg (moderately severe)   . Radiation proctitis   . Family history of adverse reaction to anesthesia     "daughter has PONV"  . Heart murmur   . Anginal pain ([place])   . H/O blood clots     "get them in my stool and urine" (11/12/2015)  . Depression   . Prostate cancer [facility]) dx'd 2014    Past Surgical History  Procedure Laterality Date  . Nasal fracture surgery      "broken years ago; several ORs to correct it"  . Tonsillectomy    . Prostate biopsy  2014    "needle  biopsy"  . Colonoscopy    . Cardiac catheterization N/A 04/21/2015    Procedure: Right/Left Heart Cath and Coronary Angiography;  Surgeon: [name], MD;  Location: [place] [facility];  Service: Cardiovascular;  Laterality: N/A;  . Cardiac catheterization N/A 06/18/2015    Procedure: Intravascular Pressure Wire/FFR Study;  Surgeon: [name], MD;  Location: [place] [facility];  Service: Cardiovascular;  Laterality: N/A;  . Cardiac catheterization N/A 06/18/2015    Procedure: Coronary Stent Intervention;  Surgeon: [name], MD;  Location: [place] [facility];  Service: Cardiovascular;  Laterality: N/A;  . Cardiac catheterization N/A 06/18/2015    Procedure: Right/Left Heart Cath and Coronary Angiography;  Surgeon: [name], MD;  Location: [place] [facility];  Service: Cardiovascular;  Laterality: N/A;  . Cardiac catheterization N/A 06/23/2015    Procedure: Left Heart Cath and Cors/Grafts Angiography;  Surgeon: [name], MD;  Location: [place] [facility];  Service: Cardiovascular;  Laterality: N/A;  . Nasal septum surgery    . Inguinal hernia repair    . Fracture surgery    . Laparoscopic appendectomy N/A 11/16/2015    Procedure: APPENDECTOMY LAPAROSCOPIC;  Surgeon: [name], MD;  Location: [place];  Service: General;  Laterality: N/A;  . Cardiac catheterization N/A 01/17/2016  Procedure: Right/Left Heart Cath and Coronary Angiography;  Surgeon: [name], MD;  Location: [place] [facility];  Service: Cardiovascular;  Laterality: N/A;  . Transcatheter aortic valve replacement, transfemoral  02/15/2016  . Transcatheter aortic valve replacement, transfemoral N/A 02/15/2016    Procedure: TRANSCATHETER AORTIC VALVE REPLACEMENT, TRANSFEMORAL;  Surgeon: [name], MD;  Location: [place];  Service: Open Heart Surgery;  Laterality: N/A;  . Tee without cardioversion N/A 02/15/2016    Procedure: TRANSESOPHAGEAL ECHOCARDIOGRAM (TEE);  Surgeon: [name], MD;  Location: [place];   Service: Open Heart Surgery;  Laterality: N/A;    Current Outpatient Prescriptions  Medication Sig Dispense Refill  . acetaminophen (TYLENOL) 500 MG tablet Take 500 mg by mouth every 8 (eight) hours as needed for mild pain. Reported on 11/26/2015    . aspirin EC 81 MG tablet Take 81 mg by mouth daily.    [name] buPROPion (WELLBUTRIN SR) 100 MG 12 hr tablet Take 100 mg by mouth daily.     . clopidogrel (PLAVIX) 75 MG tablet Take 1 tablet (75 mg total) by mouth daily. 90 tablet 3  . Fe Fum-FA-B Cmp-C-Zn-Mg-Mn-Cu (HEMOCYTE-PLUS) 106-1 MG TABS Take 1 tablet by mouth at bedtime.    [name] FLUoxetine (PROZAC) 10 MG capsule Take 1 capsule (10 mg total) by mouth daily. 90 capsule 3  . Magnesium 250 MG TABS Take 250 mg by mouth at bedtime.     . rosuvastatin (CRESTOR) 10 MG tablet Take 10 mg by mouth at bedtime.     No current facility-administered medications for this visit.    Allergies:   Review of patient's allergies indicates no known allergies.   Social History:  The patient  reports that he has quit smoking. His smoking use included Cigarettes. He smoked 0.00 packs per day. He has never used smokeless tobacco. He reports that he drinks alcohol. He reports that he does not use illicit drugs.   Family History:  The patient's  family history includes Brain cancer in his father; Depression in his daughter; Lymphoma in his mother.    ROS:  Please see the history of present illness.  Otherwise, review of systems is positive for balance problems, visual changes.  All other systems are reviewed and negative.    PHYSICAL EXAM: VS:  BP 150/66 mmHg  Pulse 72  Ht 5\' 10"  (1.778 m)  Wt 148 lb 12.8 oz (67.495 kg)  BMI 21.35 kg/m2 , BMI Body mass index is 21.35 kg/(m^2). GEN: Well nourished, well developed, in no acute distress HEENT: normal Neck: no JVD, no masses. No carotid bruits Cardiac: RRR without murmur or gallop                Respiratory:  clear to auscultation bilaterally, normal work of  breathing GI: soft, nontender, nondistended, + BS MS: no deformity or atrophy Ext: no pretibial edema, pedal pulses 2+= bilaterally Skin: warm and dry, no rash Neuro:  Strength and sensation are intact Psych: euthymic mood, full affect  EKG:  EKG is not ordered today.  Recent Labs: 07/23/2015: Pro B Natriuretic peptide (BNP) 109.0* 12/13/2015: TSH 3.455 02/16/2016: Magnesium 2.4 02/20/2016: ALT 18; BUN 12; Creatinine, Ser 0.93; Hemoglobin 11.9*; Platelets 185; Potassium 4.0; Sodium 137   Lipid Panel     Component Value Date/Time   CHOL 155 06/18/2015 0234   TRIG 99 06/18/2015 0234   HDL 49 06/18/2015 0234   CHOLHDL 3.2 06/18/2015 0234   VLDL 20 06/18/2015 0234   LDLCALC 86 06/18/2015 0234  Wt Readings from Last 3 Encounters:  03/20/16 148 lb 12.8 oz (67.495 kg)  03/03/16 140 lb (63.504 kg)  02/29/16 145 lb (65.772 kg)     Cardiac Studies Reviewed: 2D Echo 03/20/2016: Personally reviewed the images: LVEF is normal, LV systolic function is normal, mean and peak transaortic valve gradients are 8 and 15 mmHg respectively. There is trace AI.  ASSESSMENT AND PLAN: 1.  Aortic valve disease status post TAVR: Patient has NYHA functional class I symptoms. His echo is reviewed and demonstrates normal function of his transcatheter aortic valve. He should continue on dual antiplatelet therapy with aspirin and Plavix for a total of 6 months. We discussed the need for lifelong SBE prophylaxis when indicated. He will continue current medications.  2. Coronary artery disease, native vessel: Atypical angina noted. This has been long-standing and has not been related to coronary obstruction as he has had cardiac catheterization since PCI demonstrating patent coronary arteries and patent stent sites.  3. Essential hypertension: Home blood pressures have been approximately 140/80. Metoprolol was stopped because of bradycardia and low blood pressure postoperatively. Continue to monitor.  Current  medicinee reviewed with the patient today.  The patient does not have concerns regarding medicines.  Labs/ tests ordered today include: No orders of the defined types were placed in this encounter.    Disposition:   FU 6 months  Signed, [name], MD  03/20/2016 10:16 AM    [facility] [address], [place], [place]  [address] Phone: [phone]; Fax: [phone]

## 2016-03-20 NOTE — Patient Instructions (Addendum)
Medication Instructions:  Your physician recommends that you continue on your current medications as directed. Please refer to the Current Medication list given to you today.  Labwork: No new orders.   Testing/Procedures: No new orders.   Follow-Up: Your physician wants you to follow-up in: 6 MONTHS with Dr Burt Knack.  You will receive a reminder letter in the mail two months in advance. If you don't receive a letter, please call our office to schedule the follow-up appointment.   Any Other Special Instructions Will Be Listed Below (If Applicable).  Your physician discussed the importance of taking an antibiotic prior to any dental, gastrointestinal, genitourinary procedures to prevent damage to the heart valves from infection.    If you need a refill on your cardiac medications before your next appointment, please call your pharmacy.

## 2016-03-22 DIAGNOSIS — H01005 Unspecified blepharitis left lower eyelid: Secondary | ICD-10-CM | POA: Diagnosis not present

## 2016-03-22 DIAGNOSIS — H01001 Unspecified blepharitis right upper eyelid: Secondary | ICD-10-CM | POA: Diagnosis not present

## 2016-03-22 DIAGNOSIS — H01002 Unspecified blepharitis right lower eyelid: Secondary | ICD-10-CM | POA: Diagnosis not present

## 2016-03-22 DIAGNOSIS — H01004 Unspecified blepharitis left upper eyelid: Secondary | ICD-10-CM | POA: Diagnosis not present

## 2016-03-24 ENCOUNTER — Other Ambulatory Visit: Payer: Self-pay

## 2016-03-24 NOTE — Patient Outreach (Signed)
[facility]) Care Management  03/24/2016  Corey Oneill [birth year] [id number]  Subjective: Everything seems to be doing fine. I saw Dr. Burt Knack and he was pleased with how everything is going. I was very pleased about that. Member reports he has seen surgeon and has been released.   Assessment: member reports he is doing well. Member states he has bought a blood pressure cuff and discussed when to take it with his physician/paramaters for when to call discussed. Member is without questions or concerns. RNCM discussed follow up. Member states he has RNCM's number and will call with any questions or problems. RNCM reinforced with member that he can still call the 24 hour nurse advice line.  Plan close case.  Thea Silversmith, RN, MSN, [place] Coordinator Cell: [phone]

## 2016-04-03 ENCOUNTER — Other Ambulatory Visit: Payer: Self-pay | Admitting: Family Medicine

## 2016-04-03 NOTE — Telephone Encounter (Signed)
Is this okay to refill? 

## 2016-04-05 ENCOUNTER — Telehealth: Payer: Self-pay | Admitting: Cardiovascular Disease

## 2016-04-05 NOTE — Telephone Encounter (Signed)
New Message:   Please call,says he wants to discuss his blood pressure.

## 2016-04-05 NOTE — Telephone Encounter (Signed)
I spoke with the pt and he has been busy and forgot to monitor his BP since his appointment.  The pt thought about it today so he has checked his vitals multiple times today.   152/86, 75 152/85, 78 145/81, 79 119/76, 84 147/83, 82   The pt is leaving tomorrow to go to [place] and I instructed him that he should avoid salty foods while traveling.  The pt is still having issues with his balance and almost fell yesterday. When the pt comes home he should check his BP 2-3 times per week and contact the office with a trend in his readings.  The pt was previously taking Metoprolol but this was stopped after TAVR due to bradycardia and hypotension.  I instructed the pt to carry this medication with him in case he needed to take a dosage if he had to contact our office for medical advice.  Pt agreed with plan.

## 2016-04-05 NOTE — Telephone Encounter (Signed)
Agree; thx 

## 2016-04-12 DIAGNOSIS — H01001 Unspecified blepharitis right upper eyelid: Secondary | ICD-10-CM | POA: Diagnosis not present

## 2016-04-12 DIAGNOSIS — H01005 Unspecified blepharitis left lower eyelid: Secondary | ICD-10-CM | POA: Diagnosis not present

## 2016-04-12 DIAGNOSIS — H01002 Unspecified blepharitis right lower eyelid: Secondary | ICD-10-CM | POA: Diagnosis not present

## 2016-04-12 DIAGNOSIS — H01004 Unspecified blepharitis left upper eyelid: Secondary | ICD-10-CM | POA: Diagnosis not present

## 2016-04-14 ENCOUNTER — Ambulatory Visit (INDEPENDENT_AMBULATORY_CARE_PROVIDER_SITE_OTHER): Payer: Medicare Other | Admitting: Family Medicine

## 2016-04-14 ENCOUNTER — Encounter: Payer: Self-pay | Admitting: Family Medicine

## 2016-04-14 VITALS — BP 114/72 | HR 86 | Wt 146.0 lb

## 2016-04-14 DIAGNOSIS — I25119 Atherosclerotic heart disease of native coronary artery with unspecified angina pectoris: Secondary | ICD-10-CM | POA: Diagnosis not present

## 2016-04-14 DIAGNOSIS — I350 Nonrheumatic aortic (valve) stenosis: Secondary | ICD-10-CM | POA: Diagnosis not present

## 2016-04-14 DIAGNOSIS — I208 Other forms of angina pectoris: Secondary | ICD-10-CM | POA: Diagnosis not present

## 2016-04-14 DIAGNOSIS — B001 Herpesviral vesicular dermatitis: Secondary | ICD-10-CM

## 2016-04-14 MED ORDER — VALACYCLOVIR HCL 1 G PO TABS: 1000.0000 mg | ORAL_TABLET | Freq: Two times a day (BID) | ORAL | Status: DC

## 2016-04-14 NOTE — Progress Notes (Signed)
   Subjective:    Patient ID: Corey Oneill, male    DOB: 1936/01/12, 80 y.o.   MRN: [id number]  HPI He is here for evaluation of a several day history of swelling and discomfort of the lower lip. He has a previous history of herpes labialis. He has been outside in the sun recently. Usually the herpes labialis is also one side. He then stated that he is having difficulty with dizziness. Review of the record indicates he discussed this with his cardiologist. He is essentially giving me the same symptoms of dizziness especially with going up steps but not necessarily going up a hill. No chest pain, shortness of breath, diaphoresis associated with this.   Review of Systems     Objective:   Physical Exam Alert and in no distress. Lower lip is uniformly swollen with some vesicular lesions present in each corner of the lip. Cardiac exam shows a regular rhythm with slight murmur present.       Assessment & Plan:  Recurrent herpes labialis - Plan: valACYclovir (VALTREX) 1000 MG tablet  Aortic stenosis  Coronary artery disease involving native coronary artery of native heart with angina pectoris ([place]) Will place him on Valtrex as this does appear to be a significant case of herpes labialis. Cautioned him on continuation of dizziness especially with physical activity. If this gets worse, he might need to be further evaluated.

## 2016-04-17 DIAGNOSIS — L080 Pyoderma: Secondary | ICD-10-CM | POA: Diagnosis not present

## 2016-04-24 ENCOUNTER — Telehealth: Payer: Self-pay | Admitting: Cardiovascular Disease

## 2016-04-24 ENCOUNTER — Telehealth: Payer: Self-pay | Admitting: Family Medicine

## 2016-04-24 DIAGNOSIS — R310 Gross hematuria: Secondary | ICD-10-CM | POA: Diagnosis not present

## 2016-04-24 DIAGNOSIS — C61 Malignant neoplasm of prostate: Secondary | ICD-10-CM | POA: Diagnosis not present

## 2016-04-24 DIAGNOSIS — R350 Frequency of micturition: Secondary | ICD-10-CM | POA: Diagnosis not present

## 2016-04-24 NOTE — Telephone Encounter (Signed)
5/4 140/84 67 5/6 140/73 62 6/8 147/79 83   I spoke with the pt and he has not checked his BP and pulse as instructed from our 04/05/16 telephone note. The pt will attempt to monitor his BP and pulse at least twice per week and contact the office in a few weeks with an update.

## 2016-04-24 NOTE — Telephone Encounter (Signed)
No

## 2016-04-24 NOTE — Telephone Encounter (Signed)
New message      The pt is returning Lauren's call from a week ago. No other information given.

## 2016-04-24 NOTE — Telephone Encounter (Signed)
Left detailed message on pt's vm

## 2016-04-24 NOTE — Telephone Encounter (Signed)
Corey Oneill left message on my voice mail that he is wanting to join the [facility] and wants to know is there any reason he can't join?  Pt ph [phone]

## 2016-04-25 NOTE — Telephone Encounter (Signed)
Pt was notified he can join [facility]

## 2016-05-09 ENCOUNTER — Telehealth: Payer: Self-pay | Admitting: Cardiovascular Disease

## 2016-05-09 NOTE — Telephone Encounter (Signed)
Agree; thx 

## 2016-05-09 NOTE — Telephone Encounter (Signed)
Patient called to see if he could discontinue any of his medications.  Reminded him that at his OV with Dr. Burt Knack in May, he was instructed to continue current medications.  Verified with patient he is to be seen again around November for his 6 month appointment and may inquire then. He would like Dr. Burt Knack to know he has started an exercise program at the [facility] doing cardio. He is very happy about this. He understands he will he called only if changes are to be made. He was grateful for call.

## 2016-05-09 NOTE — Telephone Encounter (Signed)
Corey Oneill is calling because it has been 3 mths since his surgery and is wanting to know if there is any medications in which he can stop taking . Please call

## 2016-05-12 DIAGNOSIS — C61 Malignant neoplasm of prostate: Secondary | ICD-10-CM | POA: Diagnosis not present

## 2016-05-15 ENCOUNTER — Telehealth: Payer: Self-pay | Admitting: Family Medicine

## 2016-05-15 NOTE — Telephone Encounter (Signed)
Pt called with concerns. He states that he went to urology today and was informed his PSA levels have increased. They went from 1.3 to 9.5. Pt is very concerned. Pt can be reached at [phone].

## 2016-05-17 ENCOUNTER — Telehealth: Payer: Self-pay

## 2016-05-17 ENCOUNTER — Other Ambulatory Visit: Payer: Self-pay | Admitting: Urology

## 2016-05-17 DIAGNOSIS — C61 Malignant neoplasm of prostate: Secondary | ICD-10-CM

## 2016-05-17 NOTE — Telephone Encounter (Signed)
Pt is having CT scan on 05/26/2016 @ 10am, (radiology at 10:45, then scan at 3:00pm) at [facility]. He asked that I forward this information to you this am.

## 2016-05-26 ENCOUNTER — Ambulatory Visit (HOSPITAL_COMMUNITY)
Admission: RE | Admit: 2016-05-26 | Discharge: 2016-05-26 | Disposition: A | Discharge status: Home / Self Care | Payer: Medicare Other | Source: Ambulatory Visit | Attending: Urology | Admitting: Urology

## 2016-05-26 DIAGNOSIS — M4854XA Collapsed vertebra, not elsewhere classified, thoracic region, initial encounter for fracture: Secondary | ICD-10-CM | POA: Diagnosis not present

## 2016-05-26 DIAGNOSIS — C61 Malignant neoplasm of prostate: Secondary | ICD-10-CM | POA: Insufficient documentation

## 2016-05-26 DIAGNOSIS — N2889 Other specified disorders of kidney and ureter: Secondary | ICD-10-CM | POA: Diagnosis not present

## 2016-05-26 DIAGNOSIS — R9721 Rising PSA following treatment for malignant neoplasm of prostate: Secondary | ICD-10-CM | POA: Insufficient documentation

## 2016-05-26 DIAGNOSIS — Z8546 Personal history of malignant neoplasm of prostate: Secondary | ICD-10-CM | POA: Diagnosis not present

## 2016-05-26 MED ORDER — FLUDEOXYGLUCOSE F - 18 (FDG) INJECTION: 26.9000 | Freq: Once | INTRAVENOUS | Status: DC | PRN

## 2016-05-26 MED ORDER — TECHNETIUM TC 99M MEDRONATE IV KIT
26.9000 | PACK | Freq: Once | INTRAVENOUS | Status: AC | PRN
  Administered 2016-05-26: 26.9 via INTRAVENOUS

## 2016-05-29 DIAGNOSIS — N3040 Irradiation cystitis without hematuria: Secondary | ICD-10-CM | POA: Diagnosis not present

## 2016-05-29 DIAGNOSIS — C61 Malignant neoplasm of prostate: Secondary | ICD-10-CM | POA: Diagnosis not present

## 2016-06-27 ENCOUNTER — Other Ambulatory Visit: Payer: Self-pay | Admitting: Family Medicine

## 2016-06-27 NOTE — Telephone Encounter (Signed)
Is this okay to refill? 

## 2016-06-28 DIAGNOSIS — D509 Iron deficiency anemia, unspecified: Secondary | ICD-10-CM | POA: Diagnosis not present

## 2016-06-28 DIAGNOSIS — R197 Diarrhea, unspecified: Secondary | ICD-10-CM | POA: Diagnosis not present

## 2016-07-04 ENCOUNTER — Other Ambulatory Visit: Payer: Self-pay | Admitting: Family Medicine

## 2016-07-04 NOTE — Telephone Encounter (Signed)
Okay to get it in but have him set up an appointment for follow-up

## 2016-07-04 NOTE — Telephone Encounter (Signed)
I do not see patient has had a recent lipids checked but is this okay to refill?

## 2016-07-06 ENCOUNTER — Telehealth: Payer: Self-pay | Admitting: Cardiovascular Disease

## 2016-07-06 NOTE — Telephone Encounter (Signed)
New message      Pt state that he is taking nitroglycerin prescribed by another cardiologist and does not want to go back to him. He wants to know can he get the doctor to give him a prescription for it.      *STAT* If patient is at the pharmacy, call can be transferred to refill team.   1. Which medications need to be refilled? (please list name of each medication and dose if known)nitroglycerin .4 mg  2. Which pharmacy/location (including street and city if local pharmacy) is medication to be sent to?[facility] in [place]  3. Do they need a 30 day or 90 day supply?25 day

## 2016-07-07 MED ORDER — NITROGLYCERIN 0.4 MG SL SUBL: 0.4000 mg | SUBLINGUAL_TABLET | SUBLINGUAL | 1 refills | Status: DC | PRN

## 2016-07-07 NOTE — Telephone Encounter (Signed)
Nitroglycerin Rx sent to the pharmacy. Pt aware.

## 2016-07-12 DIAGNOSIS — S0012XA Contusion of left eyelid and periocular area, initial encounter: Secondary | ICD-10-CM | POA: Diagnosis not present

## 2016-07-13 ENCOUNTER — Encounter: Payer: Self-pay | Admitting: Family Medicine

## 2016-07-19 DIAGNOSIS — H01004 Unspecified blepharitis left upper eyelid: Secondary | ICD-10-CM | POA: Diagnosis not present

## 2016-07-19 DIAGNOSIS — H01002 Unspecified blepharitis right lower eyelid: Secondary | ICD-10-CM | POA: Diagnosis not present

## 2016-07-19 DIAGNOSIS — H01001 Unspecified blepharitis right upper eyelid: Secondary | ICD-10-CM | POA: Diagnosis not present

## 2016-07-19 DIAGNOSIS — H5202 Hypermetropia, left eye: Secondary | ICD-10-CM | POA: Diagnosis not present

## 2016-07-28 IMAGING — DX DG CHEST 2V
2 series · 2 of 2 positions shown · non-contrast
Comparison: 06/17/2015

CLINICAL DATA: Chest pain, stent placed [REDACTED]

EXAM:
CHEST  2 VIEW

[chest pa]
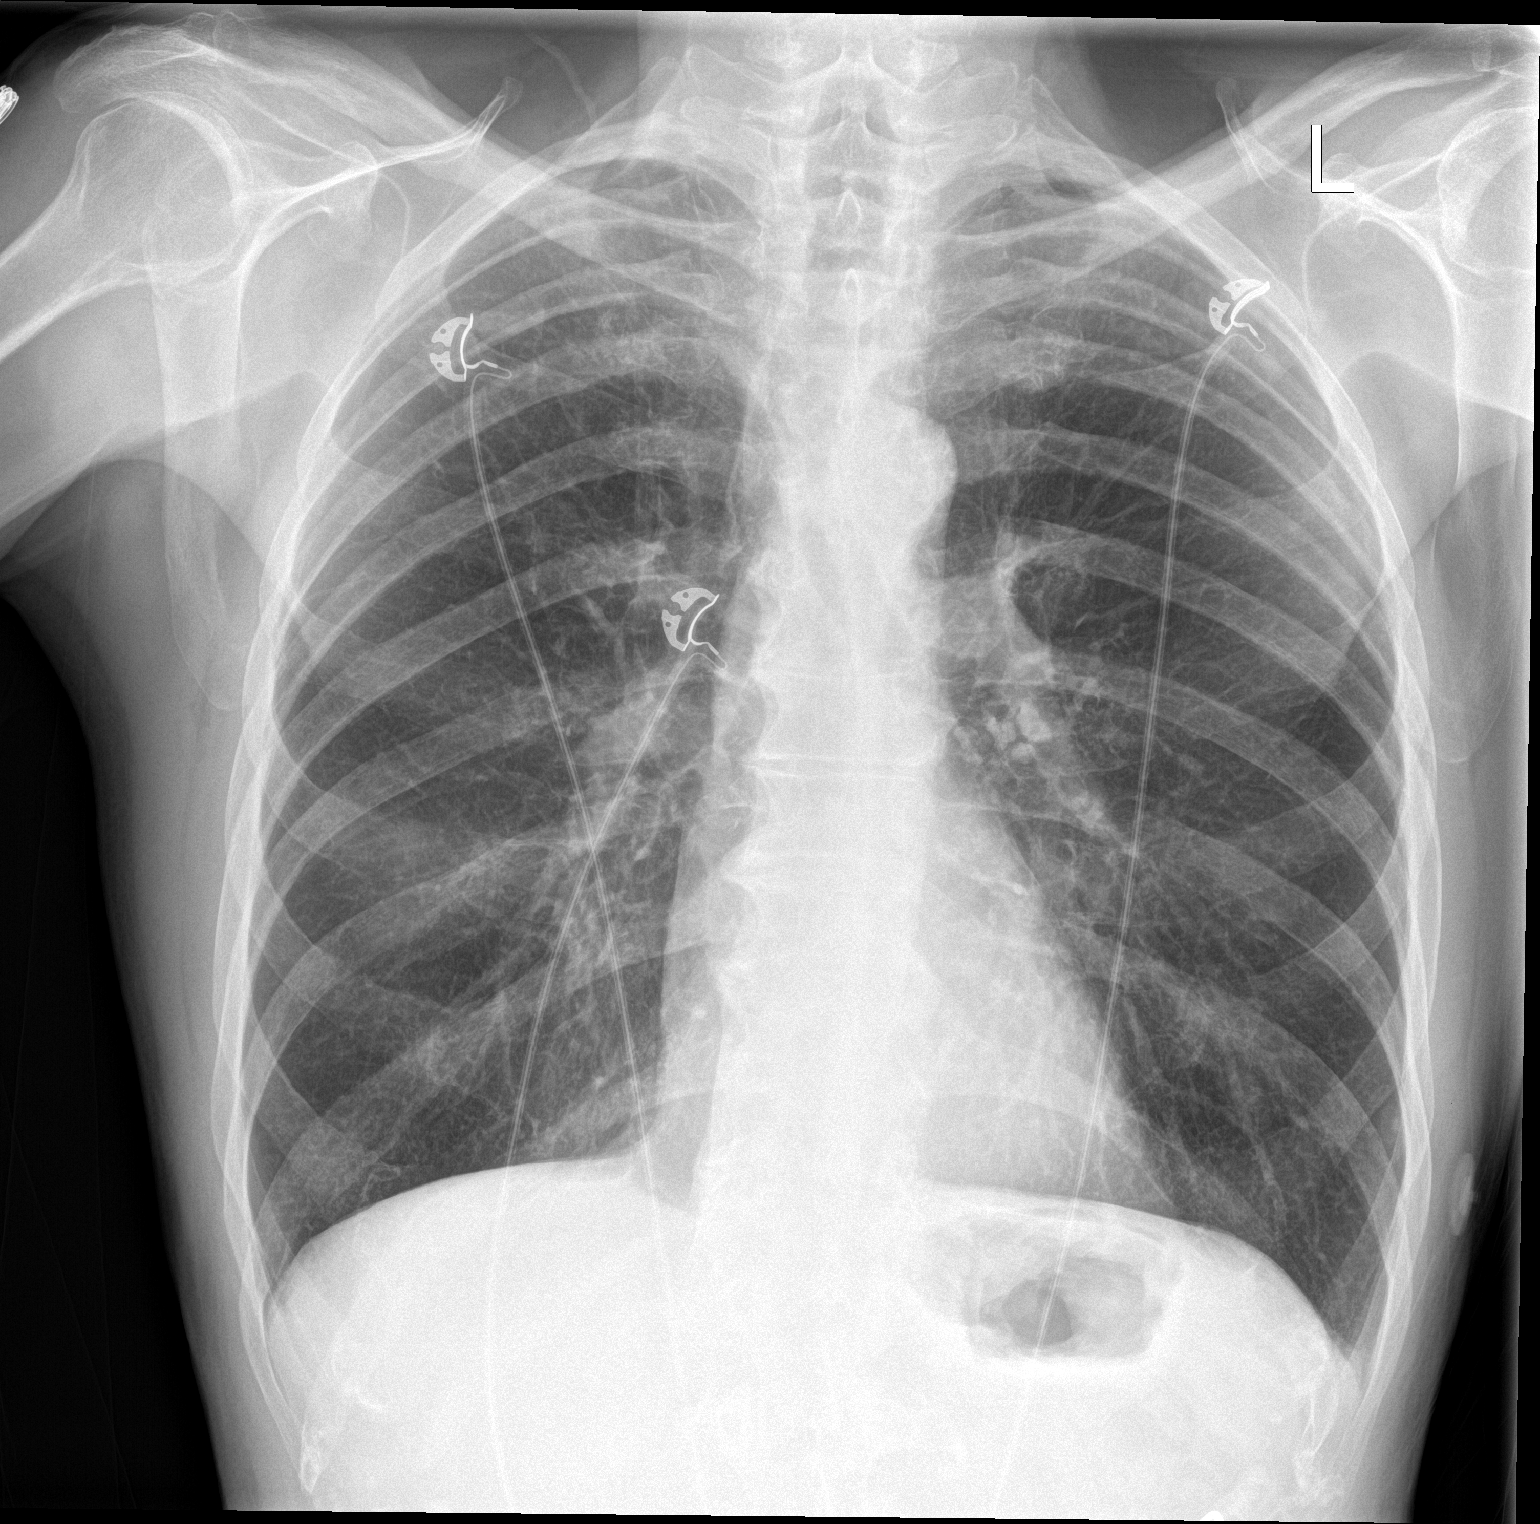

[chest lat]
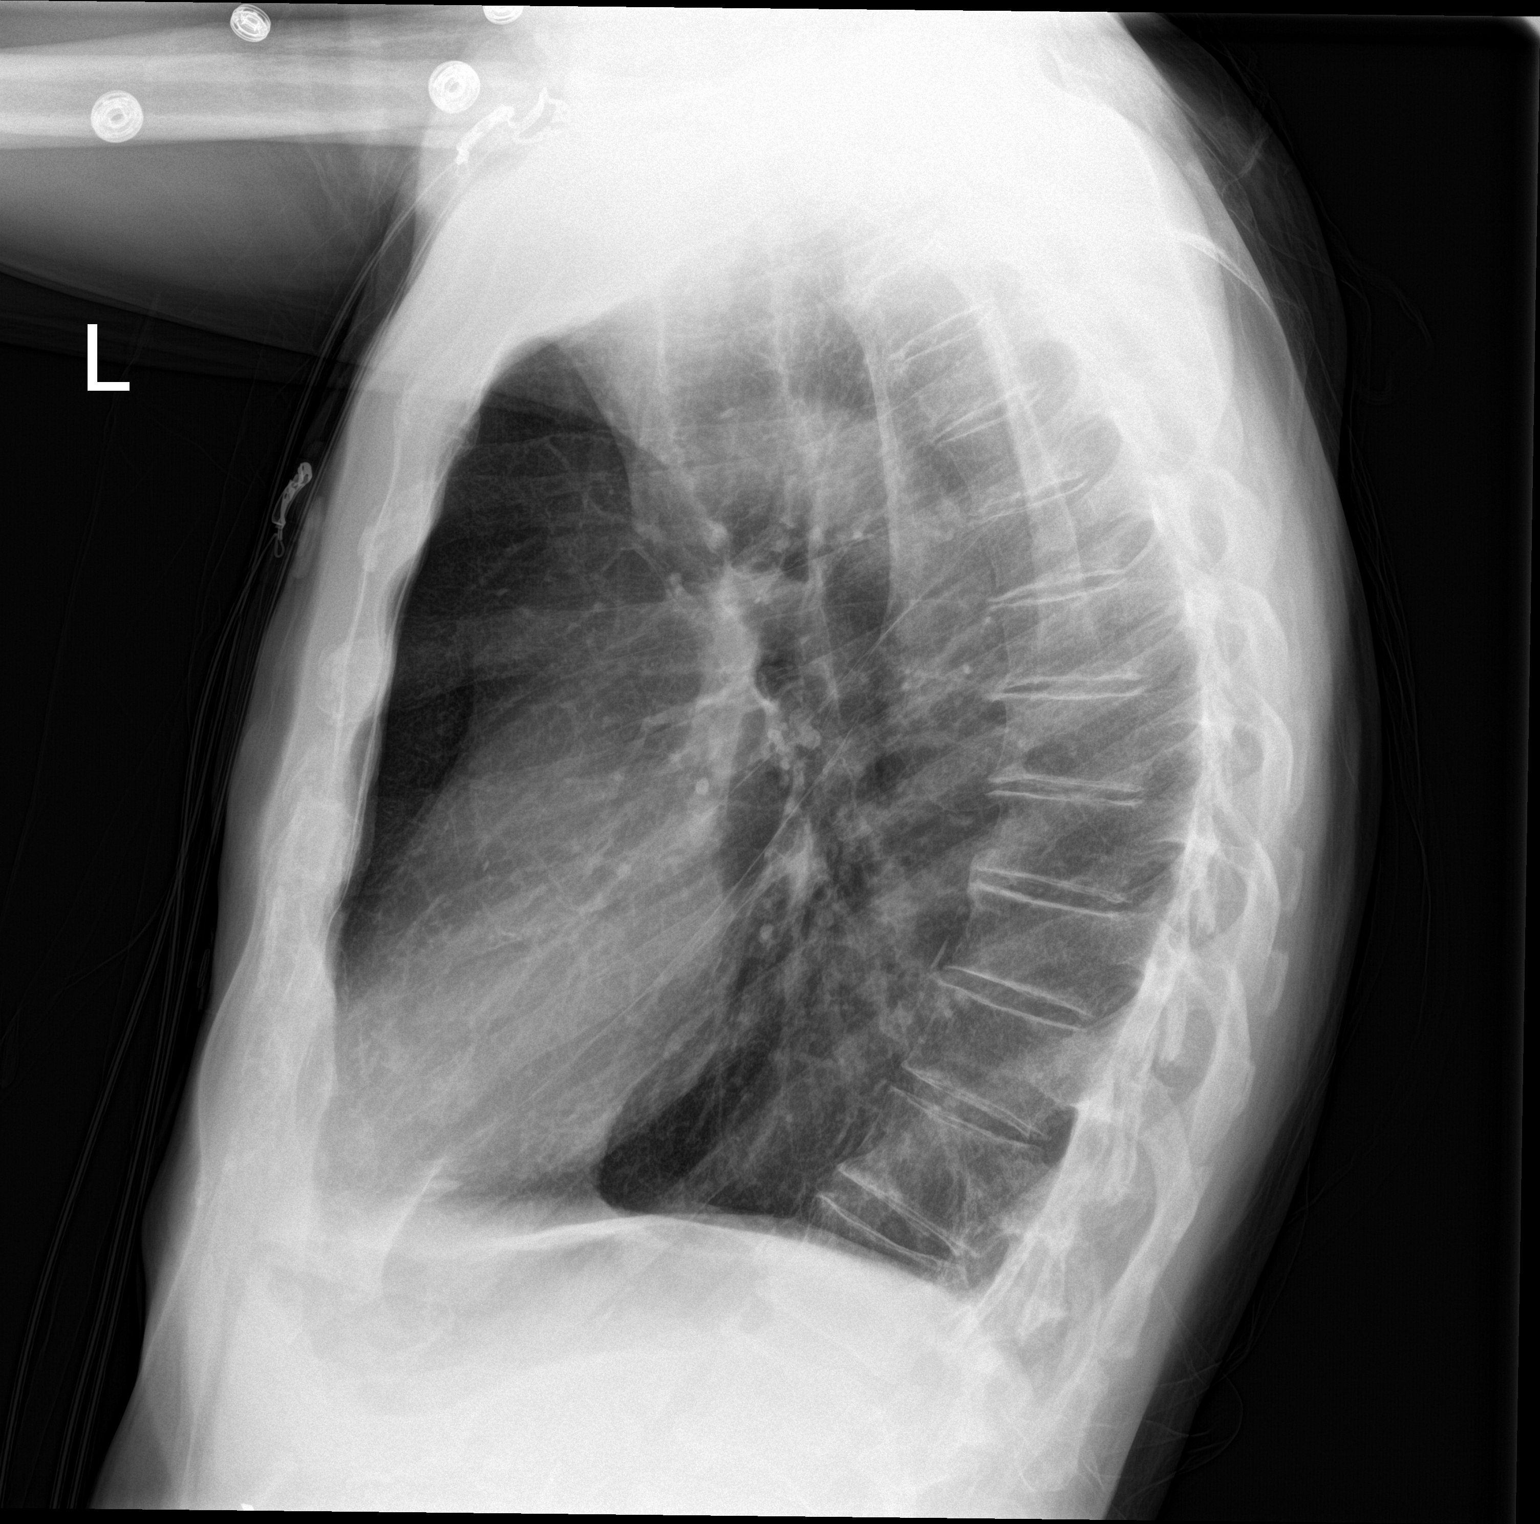

[2 of 2 positions shown; findings below may reference images not displayed]

FINDINGS: Lungs are essentially clear. No focal consolidation. No pleural
effusion or pneumothorax.

Calcified left hilar nodes.

Degenerative changes of the visualized thoracolumbar spine.
IMPRESSION: No evidence of acute cardiopulmonary disease.

## 2016-08-09 DIAGNOSIS — R197 Diarrhea, unspecified: Secondary | ICD-10-CM | POA: Diagnosis not present

## 2016-08-24 DIAGNOSIS — C61 Malignant neoplasm of prostate: Secondary | ICD-10-CM | POA: Diagnosis not present

## 2016-08-28 DIAGNOSIS — R350 Frequency of micturition: Secondary | ICD-10-CM | POA: Diagnosis not present

## 2016-08-28 DIAGNOSIS — C61 Malignant neoplasm of prostate: Secondary | ICD-10-CM | POA: Diagnosis not present

## 2016-08-29 DIAGNOSIS — Z23 Encounter for immunization: Secondary | ICD-10-CM | POA: Diagnosis not present

## 2016-09-04 ENCOUNTER — Other Ambulatory Visit: Payer: Self-pay | Admitting: Surgery

## 2016-09-04 DIAGNOSIS — D1801 Hemangioma of skin and subcutaneous tissue: Secondary | ICD-10-CM | POA: Diagnosis not present

## 2016-09-04 DIAGNOSIS — D225 Melanocytic nevi of trunk: Secondary | ICD-10-CM | POA: Diagnosis not present

## 2016-09-04 DIAGNOSIS — L570 Actinic keratosis: Secondary | ICD-10-CM | POA: Diagnosis not present

## 2016-09-04 DIAGNOSIS — L820 Inflamed seborrheic keratosis: Secondary | ICD-10-CM | POA: Diagnosis not present

## 2016-09-04 DIAGNOSIS — L821 Other seborrheic keratosis: Secondary | ICD-10-CM | POA: Diagnosis not present

## 2016-09-04 DIAGNOSIS — D0359 Melanoma in situ of other part of trunk: Secondary | ICD-10-CM | POA: Diagnosis not present

## 2016-09-04 DIAGNOSIS — L814 Other melanin hyperpigmentation: Secondary | ICD-10-CM | POA: Diagnosis not present

## 2016-09-04 DIAGNOSIS — C4359 Malignant melanoma of other part of trunk: Secondary | ICD-10-CM | POA: Diagnosis not present

## 2016-09-04 DIAGNOSIS — D039 Melanoma in situ, unspecified: Secondary | ICD-10-CM

## 2016-09-04 DIAGNOSIS — D034 Melanoma in situ of scalp and neck: Secondary | ICD-10-CM | POA: Diagnosis not present

## 2016-09-04 HISTORY — DX: Melanoma in situ, unspecified: D03.9

## 2016-09-07 ENCOUNTER — Encounter: Payer: Self-pay | Admitting: Family Medicine

## 2016-09-25 ENCOUNTER — Ambulatory Visit: Payer: Self-pay | Admitting: Surgery

## 2016-09-25 DIAGNOSIS — C439 Malignant melanoma of skin, unspecified: Secondary | ICD-10-CM | POA: Diagnosis not present

## 2016-09-25 DIAGNOSIS — C434 Malignant melanoma of scalp and neck: Secondary | ICD-10-CM | POA: Diagnosis not present

## 2016-09-25 DIAGNOSIS — D039 Melanoma in situ, unspecified: Secondary | ICD-10-CM | POA: Diagnosis not present

## 2016-09-25 NOTE — H&P (Signed)
Corey Oneill 09/25/2016 9:39 AM Location: [facility] Patient #: [id number] DOB: [birth year] Married / Language: Cleophus Oneill / Race: White Male  History of Present Illness Corey Oneill A. Katena Petitjean MD; 09/25/2016 11:50 AM) Patient words: Patient sent request of Dr. Allyson Sabal for melanoma. He had 2 areas one on his right neck and the second on his right chest that looked suspicious and underwent shave biopsy. The right neck showed melanoma in situ. The right chest showed 0.4 mm thickness melanoma. No history of ulceration given. This is picked up by his dentist. Denies any other complaints. He has recovered nicely from his appendectomy earlier this year.  The patient is a 80 year old male.   Allergies (Corey Oneill, CMA; 09/25/2016 9:40 AM) No Known Drug Allergies 12/08/2015  Medication History (Corey Oneill, CMA; 09/25/2016 9:40 AM) Tylenol Extra Strength (500MG  Tablet, Oral) Active. Aspirin (81MG  Tablet DR, Oral) Active. BuPROPion HCl ER (SR) (100MG  Tablet ER 12HR, Oral) Active. Clopidogrel Bisulfate (75MG  Tablet, Oral) Active. Hemocyte (324 (106 Fe)MG Tablet, Oral) Active. FLUoxetine HCl (10MG  Capsule, Oral) Active. Magnesium (250MG  Tablet, Oral) Active. Nitrostat (0.4MG  Tab Sublingual, Sublingual) Active. Crestor (10MG  Tablet, Oral) Active. Medications Reconciled    Vitals (Corey Oneill CMA; 09/25/2016 9:40 AM) 09/25/2016 9:40 AM Weight: 152 lb Height: 72in Body Surface Area: 1.9 m Body Mass Index: 20.61 kg/m  Temp.: 97.51F(Temporal)  Pulse: 76 (Regular)  BP: 124/74 (Sitting, Left Arm, Standard)      Physical Exam (Corey Oneill A. Phelan Goers MD; 09/25/2016 11:51 AM)  General Mental Status-Alert. General Appearance-Consistent with stated age. Hydration-Well hydrated. Voice-Normal.  Integumentary Note: Multiple skin lesions on trunk and upper extremities and neck. On his right neck is a scar measuring 5 mm from the excision site. On his  right chest is a 1 cm pigmented lesion that is partially excised. No signs of infection.  Chest and Lung Exam Chest and lung exam reveals -quiet, even and easy respiratory effort with no use of accessory muscles and on auscultation, normal breath sounds, no adventitious sounds and normal vocal resonance. Inspection Chest Wall - Normal. Back - normal.  Cardiovascular Cardiovascular examination reveals -normal heart sounds, regular rate and rhythm with no murmurs and normal pedal pulses bilaterally.  Lymphatic Head & Neck  General Head & Neck Lymphatics: Bilateral - Description - Normal. Axillary  General Axillary Region: Bilateral - Description - Normal. Tenderness - Non Tender.    Assessment & Plan (Corey Oneill A. Sherrise Liberto MD; 09/25/2016 11:52 AM)  MELANOMA (C43.9) Impression: 0.4 mm thickness right chest melanoma. Wide excision to 1 cm recommended. Given its superficial nature, I do not recommend sentinel lymph node mapping. Risk of bleeding, infection, cosmetic deformity, need further operations discussed and/or treatments.  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 Recommend excision to at least a 5 mm margin. Risk of bleeding, infection, cosmetic deformity, poor wound healing the knee further operations discussed.  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

## 2016-09-27 ENCOUNTER — Encounter: Payer: Self-pay | Admitting: Family Medicine

## 2016-09-28 ENCOUNTER — Encounter: Payer: Self-pay | Admitting: Cardiovascular Disease

## 2016-09-28 ENCOUNTER — Telehealth: Payer: Self-pay | Admitting: Cardiovascular Disease

## 2016-09-28 ENCOUNTER — Ambulatory Visit (INDEPENDENT_AMBULATORY_CARE_PROVIDER_SITE_OTHER): Payer: Medicare Other | Admitting: Cardiovascular Disease

## 2016-09-28 VITALS — BP 112/60 | HR 67 | Ht 70.0 in | Wt 152.4 lb

## 2016-09-28 DIAGNOSIS — I359 Nonrheumatic aortic valve disorder, unspecified: Secondary | ICD-10-CM | POA: Diagnosis not present

## 2016-09-28 DIAGNOSIS — I208 Other forms of angina pectoris: Secondary | ICD-10-CM

## 2016-09-28 DIAGNOSIS — Z952 Presence of prosthetic heart valve: Secondary | ICD-10-CM

## 2016-09-28 NOTE — Telephone Encounter (Signed)
This is fine. He is at low-risk of cardiac problems. Can proceed as planned.

## 2016-09-28 NOTE — Telephone Encounter (Signed)
The pt's plavix was stopped at office visit today.  I will forward this message to Dr Burt Knack for review.

## 2016-09-28 NOTE — Telephone Encounter (Signed)
Corey Oneill is calling because he has two melanomas and the oncologist( Dr. Leatha Gilding)  wants Dr. Burt Knack approval before treating

## 2016-09-28 NOTE — Progress Notes (Signed)
Cardiology Office Note Date:  09/28/2016   ID:  KIJON LITTAU, DOB Jul 14, 1936, MRN [id number]  PCP:  [name], MD  Cardiologist:  [name], MD    No chief complaint on file.    History of Present Illness: Corey Oneill is a 80 y.o. male who presents for follow-up of coronary artery disease and aortic valve disease. He has undergone PCI the right coronary artery in 2016.  He underwent TAVR on 02/15/2016 via a percutaneous right transfemoral approach.  Patient is here alone today. He's doing fairly well. He's been traveling with his work a regular basis. He has noticed that with travel he occasionally has trouble with directions or feels confused even though he is in a place that should be familiar to him. He hasn't really appreciated any other cognitive problems. He has occasional "stress" in his chest and takes nitroglycerin. He denies any exertional chest pain or pressure. No shortness of breath, edema, heart palpitations, lightheadedness, or syncope.   Past Medical History:  Diagnosis Date  . Anginal pain ([place])   . Aortic stenosis    a. peak to peak gradient by LHC 8/16:  37 mmHg (moderately severe)   . CAD (coronary artery disease)    a.  LHC 8/16: Mid to distal LAD 30%, OM1 40%, proximal mid RCA 40%, distal RCA 60% >> FFR 0.69  >> PCI: 3 x 15 mm Resolute DES  . Depression   . Esophageal reflux   . Family history of adverse reaction to anesthesia    "daughter has PONV"  . H/O blood clots    "get them in my stool and urine" (11/12/2015)  . Heart murmur   . Hx of radiation therapy 04/14/13-06/09/13   prostate 7800 cGy, 40 sessions, seminal vesicles 5600 cGy 40 sessions  . Hyperlipidemia   . 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 Prostate cancer ([place]) dx'd 2014  . Radiation proctitis     Past Surgical History:  Procedure Laterality Date  . CARDIAC CATHETERIZATION N/A 04/21/2015   Procedure: Right/Left Heart Cath and Coronary  Angiography;  Surgeon: [name], MD;  Location: [facility];  Service: Cardiovascular;  Laterality: N/A;  . CARDIAC CATHETERIZATION N/A 06/18/2015   Procedure: Intravascular Pressure Wire/FFR Study;  Surgeon: [name], MD;  Location: [facility];  Service: Cardiovascular;  Laterality: N/A;  . CARDIAC CATHETERIZATION N/A 06/18/2015   Procedure: Coronary Stent Intervention;  Surgeon: [name], MD;  Location: [facility];  Service: Cardiovascular;  Laterality: N/A;  . CARDIAC CATHETERIZATION N/A 06/18/2015   Procedure: Right/Left Heart Cath and Coronary Angiography;  Surgeon: [name], MD;  Location: [facility];  Service: Cardiovascular;  Laterality: N/A;  . CARDIAC CATHETERIZATION N/A 06/23/2015   Procedure: Left Heart Cath and Cors/Grafts Angiography;  Surgeon: [name], MD;  Location: [facility];  Service: Cardiovascular;  Laterality: N/A;  . CARDIAC CATHETERIZATION N/A 01/17/2016   Procedure: Right/Left Heart Cath and Coronary Angiography;  Surgeon: [name], MD;  Location: [facility];  Service: Cardiovascular;  Laterality: N/A;  . COLONOSCOPY    . FRACTURE SURGERY    . INGUINAL HERNIA REPAIR    . LAPAROSCOPIC APPENDECTOMY N/A 11/16/2015   Procedure: APPENDECTOMY LAPAROSCOPIC;  Surgeon: [name], MD;  Location: [facility];  Service: General;  Laterality: N/A;  . NASAL FRACTURE SURGERY     "broken years ago; several ORs to  correct it"  . NASAL SEPTUM SURGERY    . PROSTATE BIOPSY  2014   "needle biopsy"  . TEE WITHOUT CARDIOVERSION N/A 02/15/2016   Procedure: TRANSESOPHAGEAL ECHOCARDIOGRAM (TEE);  Surgeon: [name], MD;  Location: [facility];  Service: Open Heart Surgery;  Laterality: N/A;  . TONSILLECTOMY    . TRANSCATHETER AORTIC VALVE REPLACEMENT, TRANSFEMORAL  02/15/2016  . TRANSCATHETER AORTIC VALVE REPLACEMENT, TRANSFEMORAL N/A 02/15/2016   Procedure: TRANSCATHETER AORTIC VALVE REPLACEMENT, TRANSFEMORAL;  Surgeon: [name], MD;  Location: [facility];  Service: Open Heart Surgery;  Laterality: N/A;    Current Outpatient Prescriptions  Medication Sig Dispense Refill  . acetaminophen (TYLENOL) 500 MG tablet Take 500 mg by mouth every 8 (eight) hours as needed for mild pain. Reported on 11/26/2015    . aspirin EC 81 MG tablet Take 81 mg by mouth daily.    [name] buPROPion (WELLBUTRIN SR) 100 MG 12 hr tablet TAKE 1 TABLET TWICE DAILY. 60 tablet 1  . Fe Fum-FA-B Cmp-C-Zn-Mg-Mn-Cu (HEMOCYTE-PLUS) 106-1 MG TABS Take 1 tablet by mouth at bedtime.    [name] FLUoxetine (PROZAC) 10 MG capsule TAKE (1) CAPSULE DAILY. 90 capsule 1  . nitroGLYCERIN (NITROSTAT) 0.4 MG SL tablet Place 1 tablet (0.4 mg total) under the tongue every 5 (five) minutes as needed for chest pain. 25 tablet 1  . rosuvastatin (CRESTOR) 10 MG tablet TAKE 1 TABLET ONCE DAILY. 30 tablet 0  . valACYclovir (VALTREX) 1000 MG tablet Take 1 tablet (1,000 mg total) by mouth 2 (two) times daily. 6 tablet 1   No current facility-administered medications for this visit.     Allergies:   Patient has no known allergies.   Social History:  The patient  reports that he has quit smoking. His smoking use included Cigarettes. He smoked 0.00 packs per day. He has never used smokeless tobacco. He reports that he drinks alcohol. He reports that he does not use drugs.   Family History:  The patient's  family history includes Brain cancer in his father; Depression in his daughter; Lymphoma in his mother.    ROS:  Please see the history of present illness.  Otherwise, review of systems is positive for depression, balance problems.  All other systems are reviewed and negative.    PHYSICAL EXAM: VS:  BP 112/60 (BP Location: Left Arm, Patient Position: Sitting, Cuff Size: Normal)   Pulse 67   Ht 5\' 10"  (1.778 m)   Wt 152 lb 6.4 oz (69.1 kg)   BMI 21.87 kg/m  , BMI Body mass index is 21.87 kg/m. GEN: Well nourished, well developed, in no acute distress  HEENT: normal  Neck: no  JVD, no masses. No carotid bruits Cardiac: RRR with a soft early systolic murmur at the RUSB                Respiratory:  clear to auscultation bilaterally, normal work of breathing GI: soft, nontender, nondistended, + BS MS: no deformity or atrophy  Ext: no pretibial edema, pedal pulses 2+= bilaterally Skin: warm and dry, no rash Neuro:  Strength and sensation are intact Psych: euthymic mood, full affect  EKG:  EKG is ordered today. The ekg ordered today shows NSR 67 bpm, within normal limits  Recent Labs: 12/13/2015: TSH 3.455 02/16/2016: Magnesium 2.4 02/20/2016: ALT 18; BUN 12; Creatinine, Ser 0.93; Hemoglobin 11.9; Platelets 185; Potassium 4.0; Sodium 137   Lipid Panel     Component Value Date/Time   CHOL 155 06/18/2015 0234   TRIG 99  06/18/2015 0234   HDL 49 06/18/2015 0234   CHOLHDL 3.2 06/18/2015 0234   VLDL 20 06/18/2015 0234   LDLCALC 86 06/18/2015 0234      Wt Readings from Last 3 Encounters:  09/28/16 152 lb 6.4 oz (69.1 kg)  04/14/16 146 lb (66.2 kg)  03/20/16 148 lb 12.8 oz (67.5 kg)     Cardiac Studies Reviewed: 2D Echo 03-20-2016: Study Conclusions  - Left ventricle: The cavity size was normal. Wall thickness was   increased in a pattern of moderate LVH. Systolic function was   normal. The estimated ejection fraction was in the range of 55%   to 60%. Features are consistent with a pseudonormal left   ventricular filling pattern, with concomitant abnormal relaxation   and increased filling pressure (grade 2 diastolic dysfunction). - Aortic valve: A bioprosthesis was present. There was trivial   regurgitation. Valve area (VTI): 2.48 cm^2. Valve area (Vmax):   2.72 cm^2. Valve area (Vmean): 2.53 cm^2. - Right atrium: The atrium was mildly to moderately dilated.  ASSESSMENT AND PLAN: 1.  Aortic valve disease status post TAVR: NYHA functional class I symptoms now 6 months out from the procedure. He can discontinue Plavix. He should remain on aspirin 81 mg  indefinitely. He will be due for a follow-up echocardiogram at one year and I will see him back at that time.  2. Coronary artery disease, native vessel, with other forms of angina. The patient has occasional chest discomfort that seems most related to stress. He takes nitroglycerin rarely. No change to his medical program recommended.  3. Hyperlipidemia: Treated with low-dose Crestor. Followed by primary care.  4. Mild cognitive dysfunction: Patient is concerned about some of the issues he's having. He is going to contact Dr.[name] for evaluation.  Current medicines are reviewed with the patient today.  The patient does not have concerns regarding medicines.  Labs/ tests ordered today include:   Orders Placed This Encounter  Procedures  . EKG 12-Lead  . ECHOCARDIOGRAM COMPLETE   Disposition:   FU 6 months with 12 month post-TAVR eval per protocol  Signed, [name], MD  09/28/2016 5:09 PM    [facility] [address], [place], [place]  [address] Phone: [phone]; Fax: [phone]

## 2016-09-28 NOTE — Patient Instructions (Signed)
Medication Instructions:  Your physician has recommended you make the following change in your medication:  1. STOP Plavix  Labwork: No new orders.   Testing/Procedures: Your physician has requested that you have an echocardiogram in 6 MONTHS. Echocardiography is a painless test that uses sound waves to create images of your heart. It provides your doctor with information about the size and shape of your heart and how well your heart's chambers and valves are working. This procedure takes approximately one hour. There are no restrictions for this procedure.  Follow-Up: Your physician recommends that you schedule a follow-up appointment in: 6 MONTHS with Dr Burt Knack   Any Other Special Instructions Will Be Listed Below (If Applicable).     If you need a refill on your cardiac medications before your next appointment, please call your pharmacy.

## 2016-09-28 NOTE — Telephone Encounter (Signed)
I spoke with the pt and made him aware of Dr Cooper's comments.  

## 2016-10-02 ENCOUNTER — Ambulatory Visit (INDEPENDENT_AMBULATORY_CARE_PROVIDER_SITE_OTHER): Payer: Medicare Other | Admitting: Family Medicine

## 2016-10-02 ENCOUNTER — Ambulatory Visit: Payer: Medicare Other | Admitting: Cardiovascular Disease

## 2016-10-02 ENCOUNTER — Encounter: Payer: Self-pay | Admitting: Family Medicine

## 2016-10-02 ENCOUNTER — Telehealth: Payer: Self-pay

## 2016-10-02 ENCOUNTER — Other Ambulatory Visit: Payer: Self-pay

## 2016-10-02 VITALS — BP 100/50 | HR 75 | Wt 153.0 lb

## 2016-10-02 DIAGNOSIS — W57XXXA Bitten or stung by nonvenomous insect and other nonvenomous arthropods, initial encounter: Secondary | ICD-10-CM | POA: Diagnosis not present

## 2016-10-02 DIAGNOSIS — I208 Other forms of angina pectoris: Secondary | ICD-10-CM

## 2016-10-02 DIAGNOSIS — Z952 Presence of prosthetic heart valve: Secondary | ICD-10-CM

## 2016-10-02 DIAGNOSIS — C434 Malignant melanoma of scalp and neck: Secondary | ICD-10-CM | POA: Diagnosis not present

## 2016-10-02 DIAGNOSIS — F341 Dysthymic disorder: Secondary | ICD-10-CM | POA: Diagnosis not present

## 2016-10-02 MED ORDER — VORTIOXETINE HBR 5 MG PO TABS: 1.0000 | ORAL_TABLET | Freq: Every day | ORAL | 1 refills | Status: DC

## 2016-10-02 MED ORDER — DULOXETINE HCL 30 MG PO CPEP: 30.0000 mg | ORAL_CAPSULE | Freq: Every day | ORAL | 1 refills | Status: DC

## 2016-10-02 NOTE — Telephone Encounter (Signed)
Switch him to Cymbalta 30 mg #30 one daily. One refill follow-up with me in roughly 1 month

## 2016-10-02 NOTE — Telephone Encounter (Signed)
Sent in med

## 2016-10-02 NOTE — Telephone Encounter (Signed)
Fax rcvd that Trintellix 5mg  is too expensive. Pt requesting cheaper alternative. Corey Oneill

## 2016-10-02 NOTE — Patient Instructions (Signed)
Corey Oneill [phone]

## 2016-10-02 NOTE — Progress Notes (Signed)
   Subjective:    Patient ID: Corey Oneill, male    DOB: 1936/09/02, 80 y.o.   MRN: [id number]  HPI He was bitten by an insect on his left hand on Friday. He notes some swelling after that and is here concerned about possible infection. At the end of the encounter he then mentioned the fact that he did not think his psychotropic medications were working. Later on he then stated that the Wellbutrin ran out and he did not start back on it. He continues on Prozac. He is not involved in a regular counseling program. He does admit to having suicidal ideation but has no plans nor does he want to do this. He feels stuck and the marriage that he is not happy with and feels as if he has to continue to work to survive financially. He has been through a lot recently in terms of his health dealing with malignant melanoma and in need of more surgery. He is also had aortic valve replacement and is doing well from that.  Review of Systems     Objective:   Physical Exam Alert and in no distress with a flat affect. Exam of his left hand does show swelling and slight ecchymosis but is not warm red or tender.       Assessment & Plan:  Insect bite, initial encounter  Dysthymia - Plan: vortioxetine HBr (TRINTELLIX) 5 MG TABS  Malignant melanoma of neck (HCC)  S/P TAVR (transcatheter aortic valve replacement) No intervention needed for the insect bite is that it is healing nicely. Discussed the dysthymia with him. Since he has stopped the Wellbutrin we do not need to taper him. Since he want something done differently, I will have him stop the Prozac. Explained he does not need to taper off that. I will place him on Trintellix. Also recommended a call [facility] to get involved in counseling. Over 25 minutes, greater than 50% spent in counseling and coordination of care.

## 2016-10-14 ENCOUNTER — Other Ambulatory Visit: Payer: Self-pay | Admitting: Cardiovascular Disease

## 2016-10-16 ENCOUNTER — Other Ambulatory Visit: Payer: Self-pay | Admitting: Cardiovascular Disease

## 2016-10-16 ENCOUNTER — Other Ambulatory Visit: Payer: Self-pay | Admitting: Family Medicine

## 2016-10-17 NOTE — Telephone Encounter (Signed)
Medication Detail    Disp Refills Start End   nitroGLYCERIN (NITROSTAT) 0.4 MG SL tablet 25 tablet 3 10/16/2016    Sig - Route: Place 1 tablet (0.4 mg total) under the tongue every 5 (five) minutes as needed for chest pain. - Sublingual   E-Prescribing Status: Receipt confirmed by pharmacy (10/16/2016 10:57 AM EST)   Pharmacy   [facility], [address]

## 2016-10-18 NOTE — Progress Notes (Signed)
Chart reviewed by Dr Smith Robert, [place] for [facility].

## 2016-10-20 ENCOUNTER — Encounter (HOSPITAL_BASED_OUTPATIENT_CLINIC_OR_DEPARTMENT_OTHER): Payer: Self-pay | Admitting: *Deleted

## 2016-10-24 ENCOUNTER — Ambulatory Visit (HOSPITAL_BASED_OUTPATIENT_CLINIC_OR_DEPARTMENT_OTHER): Payer: Medicare Other | Admitting: Anesthesiology

## 2016-10-24 ENCOUNTER — Ambulatory Visit (HOSPITAL_BASED_OUTPATIENT_CLINIC_OR_DEPARTMENT_OTHER)
Admission: RE | Admit: 2016-10-24 | Discharge: 2016-10-24 | Disposition: A | Discharge status: Home / Self Care | Payer: Medicare Other | Source: Ambulatory Visit | Attending: Surgery | Admitting: Surgery

## 2016-10-24 ENCOUNTER — Encounter (HOSPITAL_BASED_OUTPATIENT_CLINIC_OR_DEPARTMENT_OTHER): Payer: Self-pay | Admitting: Anesthesiology

## 2016-10-24 ENCOUNTER — Encounter (HOSPITAL_BASED_OUTPATIENT_CLINIC_OR_DEPARTMENT_OTHER)
Admission: RE | Disposition: A | Discharge status: Home / Self Care | Payer: Self-pay | Source: Ambulatory Visit | Attending: Surgery

## 2016-10-24 DIAGNOSIS — C4359 Malignant melanoma of other part of trunk: Secondary | ICD-10-CM | POA: Diagnosis not present

## 2016-10-24 DIAGNOSIS — Z79899 Other long term (current) drug therapy: Secondary | ICD-10-CM | POA: Insufficient documentation

## 2016-10-24 DIAGNOSIS — E785 Hyperlipidemia, unspecified: Secondary | ICD-10-CM | POA: Diagnosis not present

## 2016-10-24 DIAGNOSIS — Z7982 Long term (current) use of aspirin: Secondary | ICD-10-CM | POA: Diagnosis not present

## 2016-10-24 DIAGNOSIS — L308 Other specified dermatitis: Secondary | ICD-10-CM | POA: Diagnosis not present

## 2016-10-24 DIAGNOSIS — L821 Other seborrheic keratosis: Secondary | ICD-10-CM | POA: Insufficient documentation

## 2016-10-24 DIAGNOSIS — Z87891 Personal history of nicotine dependence: Secondary | ICD-10-CM | POA: Diagnosis not present

## 2016-10-24 DIAGNOSIS — C434 Malignant melanoma of scalp and neck: Secondary | ICD-10-CM | POA: Diagnosis not present

## 2016-10-24 DIAGNOSIS — D0359 Melanoma in situ of other part of trunk: Secondary | ICD-10-CM | POA: Diagnosis not present

## 2016-10-24 DIAGNOSIS — F329 Major depressive disorder, single episode, unspecified: Secondary | ICD-10-CM | POA: Diagnosis not present

## 2016-10-24 HISTORY — PX: MELANOMA EXCISION: SHX5266

## 2016-10-24 SURGERY — EXCISION, MELANOMA
Anesthesia: General | Site: Chest | Laterality: Right

## 2016-10-24 MED ORDER — CHLORHEXIDINE GLUCONATE CLOTH 2 % EX PADS: 6.0000 | MEDICATED_PAD | Freq: Once | CUTANEOUS | Status: DC

## 2016-10-24 MED ORDER — FENTANYL CITRATE (PF) 100 MCG/2ML IJ SOLN
INTRAMUSCULAR | Status: AC
  Filled 2016-10-24: qty 2

## 2016-10-24 MED ORDER — BUPIVACAINE-EPINEPHRINE 0.25% -1:200000 IJ SOLN
INTRAMUSCULAR | Status: DC | PRN
  Administered 2016-10-24: 10 mL

## 2016-10-24 MED ORDER — LACTATED RINGERS IV SOLN
INTRAVENOUS | Status: DC
  Administered 2016-10-24 (×2): via INTRAVENOUS

## 2016-10-24 MED ORDER — ONDANSETRON HCL 4 MG/2ML IJ SOLN
INTRAMUSCULAR | Status: AC
  Filled 2016-10-24: qty 2

## 2016-10-24 MED ORDER — HYDROCODONE-ACETAMINOPHEN 5-325 MG PO TABS: 1.0000 | ORAL_TABLET | Freq: Four times a day (QID) | ORAL | 0 refills | Status: DC | PRN

## 2016-10-24 MED ORDER — CEFAZOLIN SODIUM-DEXTROSE 2-4 GM/100ML-% IV SOLN
INTRAVENOUS | Status: AC
  Filled 2016-10-24: qty 100

## 2016-10-24 MED ORDER — EPHEDRINE SULFATE 50 MG/ML IJ SOLN
INTRAMUSCULAR | Status: DC | PRN
  Administered 2016-10-24: 10 mg via INTRAVENOUS

## 2016-10-24 MED ORDER — CEFAZOLIN SODIUM-DEXTROSE 2-4 GM/100ML-% IV SOLN
2.0000 g | INTRAVENOUS | Status: AC
  Administered 2016-10-24: 2 g via INTRAVENOUS

## 2016-10-24 MED ORDER — MIDAZOLAM HCL 2 MG/2ML IJ SOLN: 1.0000 mg | INTRAMUSCULAR | Status: DC | PRN

## 2016-10-24 MED ORDER — LIDOCAINE 2% (20 MG/ML) 5 ML SYRINGE
INTRAMUSCULAR | Status: DC | PRN
  Administered 2016-10-24: 50 mg via INTRAVENOUS

## 2016-10-24 MED ORDER — ONDANSETRON HCL 4 MG/2ML IJ SOLN
INTRAMUSCULAR | Status: DC | PRN
  Administered 2016-10-24: 4 mg via INTRAVENOUS

## 2016-10-24 MED ORDER — FENTANYL CITRATE (PF) 100 MCG/2ML IJ SOLN
50.0000 ug | INTRAMUSCULAR | Status: DC | PRN
  Administered 2016-10-24: 50 ug via INTRAVENOUS

## 2016-10-24 MED ORDER — LIDOCAINE 2% (20 MG/ML) 5 ML SYRINGE
INTRAMUSCULAR | Status: AC
  Filled 2016-10-24: qty 5

## 2016-10-24 MED ORDER — PROPOFOL 10 MG/ML IV BOLUS
INTRAVENOUS | Status: DC | PRN
  Administered 2016-10-24: 160 mg via INTRAVENOUS

## 2016-10-24 MED ORDER — ONDANSETRON HCL 4 MG/2ML IJ SOLN: 4.0000 mg | Freq: Once | INTRAMUSCULAR | Status: DC | PRN

## 2016-10-24 MED ORDER — OXYCODONE HCL 5 MG/5ML PO SOLN: 5.0000 mg | Freq: Once | ORAL | Status: DC | PRN

## 2016-10-24 MED ORDER — SCOPOLAMINE 1 MG/3DAYS TD PT72: 1.0000 | MEDICATED_PATCH | Freq: Once | TRANSDERMAL | Status: DC | PRN

## 2016-10-24 MED ORDER — FENTANYL CITRATE (PF) 100 MCG/2ML IJ SOLN
25.0000 ug | INTRAMUSCULAR | Status: DC | PRN
  Administered 2016-10-24: 50 ug via INTRAVENOUS

## 2016-10-24 MED ORDER — OXYCODONE HCL 5 MG PO TABS: 5.0000 mg | ORAL_TABLET | Freq: Once | ORAL | Status: DC | PRN

## 2016-10-24 SURGICAL SUPPLY — 44 items
APL SKNCLS STERI-STRIP NONHPOA (GAUZE/BANDAGES/DRESSINGS)
BENZOIN TINCTURE PRP APPL 2/3 (GAUZE/BANDAGES/DRESSINGS) IMPLANT
BLADE SURG 10 STRL SS (BLADE) IMPLANT
BLADE SURG 15 STRL LF DISP TIS (BLADE) ×1 IMPLANT
BLADE SURG 15 STRL SS (BLADE) ×3
CANISTER SUCT 1200ML W/VALVE (MISCELLANEOUS) IMPLANT
CHLORAPREP W/TINT 26ML (MISCELLANEOUS) ×3 IMPLANT
CLOSURE WOUND 1/2 X4 (GAUZE/BANDAGES/DRESSINGS)
COVER BACK TABLE 60X90IN (DRAPES) ×3 IMPLANT
COVER MAYO STAND STRL (DRAPES) ×3 IMPLANT
DECANTER SPIKE VIAL GLASS SM (MISCELLANEOUS) IMPLANT
DRAPE LAPAROTOMY 100X72 PEDS (DRAPES) ×3 IMPLANT
DRAPE UTILITY XL STRL (DRAPES) ×3 IMPLANT
ELECT COATED BLADE 2.86 ST (ELECTRODE) ×1 IMPLANT
ELECT REM PT RETURN 9FT ADLT (ELECTROSURGICAL) ×3
ELECTRODE REM PT RTRN 9FT ADLT (ELECTROSURGICAL) ×1 IMPLANT
GLOVE BIOGEL PI IND STRL 6.5 (GLOVE) IMPLANT
GLOVE BIOGEL PI IND STRL 7.0 (GLOVE) IMPLANT
GLOVE BIOGEL PI IND STRL 8 (GLOVE) ×1 IMPLANT
GLOVE BIOGEL PI INDICATOR 6.5 (GLOVE) ×2
GLOVE BIOGEL PI INDICATOR 7.0 (GLOVE) ×2
GLOVE BIOGEL PI INDICATOR 8 (GLOVE) ×2
GLOVE ECLIPSE 6.5 STRL STRAW (GLOVE) ×2 IMPLANT
GLOVE ECLIPSE 8.0 STRL XLNG CF (GLOVE) ×3 IMPLANT
GOWN STRL REUS W/ TWL LRG LVL3 (GOWN DISPOSABLE) ×2 IMPLANT
GOWN STRL REUS W/TWL LRG LVL3 (GOWN DISPOSABLE) ×6
NDL HYPO 25X1 1.5 SAFETY (NEEDLE) ×1 IMPLANT
NEEDLE HYPO 25X1 1.5 SAFETY (NEEDLE) ×3 IMPLANT
NS IRRIG 1000ML POUR BTL (IV SOLUTION) IMPLANT
PACK BASIN DAY SURGERY FS (CUSTOM PROCEDURE TRAY) ×3 IMPLANT
PENCIL BUTTON HOLSTER BLD 10FT (ELECTRODE) ×3 IMPLANT
SLEEVE SCD COMPRESS KNEE MED (MISCELLANEOUS) ×2 IMPLANT
SPONGE LAP 4X18 X RAY DECT (DISPOSABLE) IMPLANT
STAPLER VISISTAT 35W (STAPLE) IMPLANT
STRIP CLOSURE SKIN 1/2X4 (GAUZE/BANDAGES/DRESSINGS) IMPLANT
SUT MON AB 4-0 PC3 18 (SUTURE) ×5 IMPLANT
SUT VICRYL 3-0 CR8 SH (SUTURE) ×3 IMPLANT
SUT VICRYL AB 3 0 TIES (SUTURE) IMPLANT
SYR CONTROL 10ML LL (SYRINGE) ×3 IMPLANT
TOWEL OR 17X24 6PK STRL BLUE (TOWEL DISPOSABLE) ×6 IMPLANT
TOWEL OR NON WOVEN STRL DISP B (DISPOSABLE) ×1 IMPLANT
TUBE CONNECTING 20'X1/4 (TUBING)
TUBE CONNECTING 20X1/4 (TUBING) IMPLANT
YANKAUER SUCT BULB TIP NO VENT (SUCTIONS) IMPLANT

## 2016-10-24 NOTE — Transfer of Care (Signed)
Immediate Anesthesia Transfer of Care Note  Patient: Corey Oneill  Procedure(s) Performed: Procedure(s) with comments: WIDE EXCISION MELANOMA RIGHT NECK AND RIGHT CHEST TIMES 2 (Right) - right medial and lateral lesion of chest and right neck  Patient Location: PACU  Anesthesia Type:General  Level of Consciousness: awake  Airway & Oxygen Therapy: Patient Spontanous Breathing and Patient connected to face mask oxygen  Post-op Assessment: Report given to RN and Post -op Vital signs reviewed and stable  Post vital signs: Reviewed and stable  Last Vitals:  Vitals:   10/24/16 1323 10/24/16 1324  BP:    Pulse: 83 85  Resp:  11  Temp:      Last Pain:  Vitals:   10/24/16 1214  TempSrc: Oral  PainSc: 0-No pain         Complications: No apparent anesthesia complications

## 2016-10-24 NOTE — Anesthesia Postprocedure Evaluation (Signed)
Anesthesia Post Note  Patient: Corey Oneill  Procedure(s) Performed: Procedure(s) (LRB): WIDE EXCISION MELANOMA RIGHT NECK AND RIGHT CHEST TIMES 2 (Right)  Patient location during evaluation: PACU Anesthesia Type: General Level of consciousness: awake and alert and oriented Pain management: pain level controlled Vital Signs Assessment: post-procedure vital signs reviewed and stable Respiratory status: spontaneous breathing, nonlabored ventilation and respiratory function stable Cardiovascular status: blood pressure returned to baseline and stable Postop Assessment: no signs of nausea or vomiting Anesthetic complications: no    Last Vitals:  Vitals:   10/24/16 1330 10/24/16 1345  BP: (!) 145/66 125/62  Pulse: 100 83  Resp: 18 19  Temp:      Last Pain:  Vitals:   10/24/16 1345  TempSrc:   PainSc: 1                  Kaela Beitz A.

## 2016-10-24 NOTE — Interval H&P Note (Signed)
History and Physical Interval Note:  10/24/2016 11:55 AM  Corey Oneill  has presented today for surgery, with the diagnosis of melanoma  The various methods of treatment have been discussed with the patient and family. After consideration of risks, benefits and other options for treatment, the patient has consented to  Procedure(s): WIDE EXCISION MELANOMA RIGHT NECK AND RIGHT CHEST (Right) as a surgical intervention .  The patient's history has been reviewed, patient examined, no change in status, stable for surgery.  I have reviewed the patient's chart and labs.  Questions were answered to the patient's satisfaction.     Yalena Colon A.

## 2016-10-24 NOTE — Anesthesia Preprocedure Evaluation (Addendum)
Anesthesia Evaluation  Patient identified by MRN, date of birth, ID band Patient awake    Reviewed: Allergy & Precautions, NPO status , Patient's Chart, lab work & pertinent test results  Airway Mallampati: II  TM Distance: >3 FB Neck ROM: Full    Dental  (+) Teeth Intact, Dental Advisory Given   Pulmonary former smoker,    breath sounds clear to auscultation       Cardiovascular  Rhythm:Regular Rate:Normal     Neuro/Psych    GI/Hepatic   Endo/Other    Renal/GU      Musculoskeletal   Abdominal   Peds  Hematology   Anesthesia Other Findings   Reproductive/Obstetrics                            Anesthesia Physical Anesthesia Plan  ASA: III  Anesthesia Plan: General   Post-op Pain Management:    Induction: Intravenous  Airway Management Planned: LMA  Additional Equipment:   Intra-op Plan:   Post-operative Plan:   Informed Consent: I have reviewed the patients History and Physical, chart, labs and discussed the procedure including the risks, benefits and alternatives for the proposed anesthesia with the patient or authorized representative who has indicated his/her understanding and acceptance.   Dental advisory given  Plan Discussed with: CRNA and Anesthesiologist  Anesthesia Plan Comments:        Anesthesia Quick Evaluation

## 2016-10-24 NOTE — Anesthesia Procedure Notes (Signed)
Procedure Name: LMA Insertion Date/Time: 10/24/2016 12:36 PM Performed by: Lieutenant Diego Pre-anesthesia Checklist: Patient identified, Emergency Drugs available, Suction available and Patient being monitored Patient Re-evaluated:Patient Re-evaluated prior to inductionOxygen Delivery Method: Circle system utilized Preoxygenation: Pre-oxygenation with 100% oxygen Intubation Type: IV induction Ventilation: Mask ventilation without difficulty LMA: LMA inserted LMA Size: 4.0 Number of attempts: 1 Airway Equipment and Method: Bite block Placement Confirmation: positive ETCO2 and breath sounds checked- equal and bilateral Tube secured with: Tape Dental Injury: Teeth and Oropharynx as per pre-operative assessment

## 2016-10-24 NOTE — Op Note (Signed)
Preoperative diagnosis: #1 melanoma in situ right neck #2 right medial chest melanoma #3 right lateral chest atypical skin lesion  Postoperative diagnosis: Same  Procedure: Wide excision of right neck melanoma in situ, right medial chest melanoma and right lateral chest atypical skin lesion  Surgeon: [name] M.D.  Anesthesia: Gen. with 0.25% Sensorcaine local with epinephrine  EBL: Minimal  Specimens: Please see above  Indications for procedure: The patient presented to my office with 3 atypical skin lesions. He underwent biopsy by a dermatologist which showed melanoma in situ along his right neck, a 0.4 mm deep melanoma involving his medial right chest and atypical skin lesion on his lateral right chest wall. Excision was recommended for all. Risks, benefits and alternative therapies were discussed with the patient. He agreed to proceed.  Description of procedure: The patient was brought to the operating room and placed supine. After induction of LMA anesthesia, right neck and right chest regions were prepped and draped in a sterile fashion. Each lesion was identified and encircled in a roughly 1 cm margin was drawn around these areas. The right medial chest lesion was done first. After infiltration of local anesthetic, a curvilinear incision was made above and below for a grossly 1 cm negative margin. This is taken down to the fast the pectoralis major. This was enclosed with a deep layer 3-0 Vicryl and 4-0 Monocryl. Specimen sent to pathology. In a similar fashion the right lateral chest skin lesion was identified. Insular fashion a curvilinear incision was made above and below the lesion. He was excised in its entirety with grossly negative margin.[name] Excision measured 2 cm x 3 cm. This segment of the chest wall and closed with 3-0 Vicryl for Monocryl. All of note the medial chest lesion excision was 3 cm x 3 cm. The right neck lesion was excised in a similar fashion with curvilinear  incisions were total area of excision of 3 cm x 3 cm. It was closed in a similar fashion with 3-0 Vicryl and 4-0 Monocryl. Hemostasis achieved. Agrees applied. All final counts found to be correct. Patient was awoke extubated taken to recovery in satisfactory condition.

## 2016-10-24 NOTE — Discharge Instructions (Signed)
GENERAL SURGERY: POST OP INSTRUCTIONS ° °###################################################################### ° °EAT °Gradually transition to a high fiber diet with a fiber supplement over the next few weeks after discharge.  Start with a pureed / full liquid diet (see below) ° °WALK °Walk an hour a day.  Control your pain to do that.   ° °CONTROL PAIN °Control pain so that you can walk, sleep, tolerate sneezing/coughing, go up/down stairs. ° °HAVE A BOWEL MOVEMENT DAILY °Keep your bowels regular to avoid problems.  OK to try a laxative to override constipation.  OK to use an antidairrheal to slow down diarrhea.  Call if not better after 2 tries ° °CALL IF YOU HAVE PROBLEMS/CONCERNS °Call if you are still struggling despite following these instructions. °Call if you have concerns not answered by these instructions ° °###################################################################### ° ° ° °1. DIET: Follow a light bland diet the first 24 hours after arrival home, such as soup, liquids, crackers, etc.  Be sure to include lots of fluids daily.  Avoid fast food or heavy meals as your are more likely to get nauseated.   °2. Take your usually prescribed home medications unless otherwise directed. °3. PAIN CONTROL: °a. Pain is best controlled by a usual combination of three different methods TOGETHER: °i. Ice/Heat °ii. Over the counter pain medication °iii. Prescription pain medication °b. Most patients will experience some swelling and bruising around the incisions.  Ice packs or heating pads (30-60 minutes up to 6 times a day) will help. Use ice for the first few days to help decrease swelling and bruising, then switch to heat to help relax tight/sore spots and speed recovery.  Some people prefer to use ice alone, heat alone, alternating between ice & heat.  Experiment to what works for you.  Swelling and bruising can take several weeks to resolve.   °c. It is helpful to take an over-the-counter pain medication  regularly for the first few weeks.  Choose one of the following that works best for you: °i. Naproxen (Aleve, etc)  Two 220mg tabs twice a day °ii. Ibuprofen (Advil, etc) Three 200mg tabs four times a day (every meal & bedtime) °iii. Acetaminophen (Tylenol, etc) 500-650mg four times a day (every meal & bedtime) °d. A  prescription for pain medication (such as oxycodone, hydrocodone, etc) should be given to you upon discharge.  Take your pain medication as prescribed.  °i. If you are having problems/concerns with the prescription medicine (does not control pain, nausea, vomiting, rash, itching, etc), please call us [phone] to see if we need to switch you to a different pain medicine that will work better for you and/or control your side effect better. °ii. If you need a refill on your pain medication, please contact your pharmacy.  They will contact our office to request authorization. Prescriptions will not be filled after 5 pm or on week-ends. °4. Avoid getting constipated.  Between the surgery and the pain medications, it is common to experience some constipation.  Increasing fluid intake and taking a fiber supplement (such as Metamucil, Citrucel, FiberCon, MiraLax, etc) 1-2 times a day regularly will usually help prevent this problem from occurring.  A mild laxative (prune juice, Milk of Magnesia, MiraLax, etc) should be taken according to package directions if there are no bowel movements after 48 hours.   °5. Wash / shower every day.  You may shower over the dressings as they are waterproof.  Continue to shower over incision(s) after the dressing is off. °6. Remove your waterproof bandages   5 days after surgery.  You may leave the incision open to air.  You may have skin tapes (Steri Strips) covering the incision(s).  Leave them on until one week, then remove.  You may replace a dressing/Band-Aid to cover the incision for comfort if you wish.  ° ° ° ° °7. ACTIVITIES as tolerated:   °a. You may resume  regular (light) daily activities beginning the next day--such as daily self-care, walking, climbing stairs--gradually increasing activities as tolerated.  If you can walk 30 minutes without difficulty, it is safe to try more intense activity such as jogging, treadmill, bicycling, low-impact aerobics, swimming, etc. °b. Save the most intensive and strenuous activity for last such as sit-ups, heavy lifting, contact sports, etc  Refrain from any heavy lifting or straining until you are off narcotics for pain control.   °c. DO NOT PUSH THROUGH PAIN.  Let pain be your guide: If it hurts to do something, don't do it.  Pain is your body warning you to avoid that activity for another week until the pain goes down. °d. You may drive when you are no longer taking prescription pain medication, you can comfortably wear a seatbelt, and you can safely maneuver your car and apply brakes. °e. You may have sexual intercourse when it is comfortable.  °8. FOLLOW UP in our office °a. Please call [facility] at [phone] to set up an appointment to see your surgeon in the office for a follow-up appointment approximately 2-3 weeks after your surgery. °b. Make sure that you call for this appointment the day you arrive home to insure a convenient appointment time. °9. IF YOU HAVE DISABILITY OR FAMILY LEAVE FORMS, BRING THEM TO THE OFFICE FOR PROCESSING.  DO NOT GIVE THEM TO YOUR DOCTOR. ° ° °WHEN TO CALL US [phone]: °1. Poor pain control °2. Reactions / problems with new medications (rash/itching, nausea, etc)  °3. Fever over 101.5 F (38.5 C) °4. Worsening swelling or bruising °5. Continued bleeding from incision. °6. Increased pain, redness, or drainage from the incision °7. Difficulty breathing / swallowing ° ° The clinic staff is available to answer your questions during regular business hours (8:30am-5pm).  Please don’t hesitate to call and ask to speak to one of our nurses for clinical concerns.  ° If you have a medical emergency,  go to the nearest emergency room or call 911. ° A surgeon from [facility] is always on call at the hospitals ° ° °[facility] °[address], [address], [place], [place]  [address] ? °MAIN: [phone] ? TOLL FREE: [phone] ?  °FAX [phone] °[email or web address] ° ° °Post Anesthesia Home Care Instructions ° °Activity: °Get plenty of rest for the remainder of the day. A responsible adult should stay with you for 24 hours following the procedure.  °For the next 24 hours, DO NOT: °-Drive a car °-Operate machinery °-Drink alcoholic beverages °-Take any medication unless instructed by your physician °-Make any legal decisions or sign important papers. ° °Meals: °Start with liquid foods such as gelatin or soup. Progress to regular foods as tolerated. Avoid greasy, spicy, heavy foods. If nausea and/or vomiting occur, drink only clear liquids until the nausea and/or vomiting subsides. Call your physician if vomiting continues. ° °Special Instructions/Symptoms: °Your throat may feel dry or sore from the anesthesia or the breathing tube placed in your throat during surgery. If this causes discomfort, gargle with warm salt water. The discomfort should disappear within 24 hours. ° °  If you had a scopolamine patch placed behind your ear for the management of post- operative nausea and/or vomiting: ° °1. The medication in the patch is effective for 72 hours, after which it should be removed.  Wrap patch in a tissue and discard in the trash. Wash hands thoroughly with soap and water. °2. You may remove the patch earlier than 72 hours if you experience unpleasant side effects which may include dry mouth, dizziness or visual disturbances. °3. Avoid touching the patch. Wash your hands with soap and water after contact with the patch. °  ° ° °

## 2016-10-24 NOTE — H&P (View-Only) (Signed)
Corey Oneill 09/25/2016 9:39 AM Location: [facility] Patient #: [id number] DOB: Apr 07, 1936 Married / Language: Cleophus Molt / Race: White Male  History of Present Illness [name] A. Parrish Daddario MD; 09/25/2016 11:50 AM) Patient words: Patient sent request of Dr. Allyson Sabal for melanoma. He had 2 areas one on his right neck and the second on his right chest that looked suspicious and underwent shave biopsy. The right neck showed melanoma in situ. The right chest showed 0.4 mm thickness melanoma. No history of ulceration given. This is picked up by his dentist. Denies any other complaints. He has recovered nicely from his appendectomy earlier this year.  The patient is a 80 year old male.   Allergies (Sonya Bynum, CMA; 09/25/2016 9:40 AM) No Known Drug Allergies 12/08/2015  Medication History (Sonya Bynum, CMA; 09/25/2016 9:40 AM) Tylenol Extra Strength (500MG  Tablet, Oral) Active. Aspirin (81MG  Tablet DR, Oral) Active. BuPROPion HCl ER (SR) (100MG  Tablet ER 12HR, Oral) Active. Clopidogrel Bisulfate (75MG  Tablet, Oral) Active. Hemocyte (324 (106 Fe)MG Tablet, Oral) Active. FLUoxetine HCl (10MG  Capsule, Oral) Active. Magnesium (250MG  Tablet, Oral) Active. Nitrostat (0.4MG  Tab Sublingual, Sublingual) Active. Crestor (10MG  Tablet, Oral) Active. Medications Reconciled    Vitals (Sonya Bynum CMA; 09/25/2016 9:40 AM) 09/25/2016 9:40 AM Weight: 152 lb Height: 72in Body Surface Area: 1.9 m Body Mass Index: 20.61 kg/m  Temp.: 97.77F(Temporal)  Pulse: 76 (Regular)  BP: 124/74 (Sitting, Left Arm, Standard)      Physical Exam (Danyia Borunda A. Channell Quattrone MD; 09/25/2016 11:51 AM)  General Mental Status-Alert. General Appearance-Consistent with stated age. Hydration-Well hydrated. Voice-Normal.  Integumentary Note: Multiple skin lesions on trunk and upper extremities and neck. On his right neck is a scar measuring 5 mm from the excision site. On his  right chest is a 1 cm pigmented lesion that is partially excised. No signs of infection.  Chest and Lung Exam Chest and lung exam reveals -quiet, even and easy respiratory effort with no use of accessory muscles and on auscultation, normal breath sounds, no adventitious sounds and normal vocal resonance. Inspection Chest Wall - Normal. Back - normal.  Cardiovascular Cardiovascular examination reveals -normal heart sounds, regular rate and rhythm with no murmurs and normal pedal pulses bilaterally.  Lymphatic Head & Neck  General Head & Neck Lymphatics: Bilateral - Description - Normal. Axillary  General Axillary Region: Bilateral - Description - Normal. Tenderness - Non Tender.    Assessment & Plan (Diondre Pulis A. Soriyah Osberg MD; 09/25/2016 11:52 AM)  MELANOMA (C43.9) Impression: 0.4 mm thickness right chest melanoma. Wide excision to 1 cm recommended. Given its superficial nature, I do not recommend sentinel lymph node mapping. Risk of bleeding, infection, cosmetic deformity, need further operations discussed and/or treatments.  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 Recommend excision to at least a 5 mm margin. Risk of bleeding, infection, cosmetic deformity, poor wound healing the knee further operations discussed.  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

## 2016-10-25 ENCOUNTER — Encounter (HOSPITAL_BASED_OUTPATIENT_CLINIC_OR_DEPARTMENT_OTHER): Payer: Self-pay | Admitting: Surgery

## 2016-11-01 ENCOUNTER — Ambulatory Visit: Payer: Medicare Other | Admitting: Family Medicine

## 2016-11-03 DIAGNOSIS — S61412A Laceration without foreign body of left hand, initial encounter: Secondary | ICD-10-CM | POA: Diagnosis not present

## 2016-11-15 ENCOUNTER — Encounter: Payer: Self-pay | Admitting: Family Medicine

## 2016-11-15 ENCOUNTER — Ambulatory Visit (INDEPENDENT_AMBULATORY_CARE_PROVIDER_SITE_OTHER): Payer: Medicare Other | Admitting: Family Medicine

## 2016-11-15 VITALS — BP 110/60 | HR 77 | Resp 16 | Wt 154.2 lb

## 2016-11-15 DIAGNOSIS — F341 Dysthymic disorder: Secondary | ICD-10-CM

## 2016-11-15 DIAGNOSIS — C434 Malignant melanoma of scalp and neck: Secondary | ICD-10-CM

## 2016-11-15 MED ORDER — VORTIOXETINE HBR 10 MG PO TABS: 1.0000 | ORAL_TABLET | Freq: Every day | ORAL | 1 refills | Status: DC

## 2016-11-15 NOTE — Patient Instructions (Signed)
Your job is to make 2018 the best your you've ever had Go to the [employer]

## 2016-11-15 NOTE — Progress Notes (Signed)
   Subjective:    Patient ID: Corey Oneill, male    DOB: 20-Apr-1936, 81 y.o.   MRN: [id number]  HPI He is here for recheck. He has been on Trintellix for one month and does state that he is roughly 50% better. He has not gotten involved in counseling yet. He has been dealing with some melanoma lesions. He is scheduled to see his surgeon again for follow-up on recent excision. He then stated that he feels as if this is the last year of his life. When asked about this in more detail he could not give me any answers.   Review of Systems     Objective:   Physical Exam Alert and in no distress with appropriate affect.       Assessment & Plan:  Dysthymia - Plan: vortioxetine HBr (TRINTELLIX) 10 MG TABS  Malignant melanoma of neck (HCC) Will increase his Trintellix to 10 mg. Also discussed the mindset of hitting himself up to I sometime this year. Explained that the mind is a goal seeking mechanisms [place] given positive goal. He states that he will follow-up with One[facility] Arenas. Check here in 6 weeks.

## 2016-11-18 ENCOUNTER — Other Ambulatory Visit: Payer: Self-pay | Admitting: Family Medicine

## 2016-11-22 ENCOUNTER — Telehealth: Payer: Self-pay | Admitting: Oncology

## 2016-11-22 ENCOUNTER — Encounter: Payer: Self-pay | Admitting: Oncology

## 2016-11-22 NOTE — Telephone Encounter (Signed)
Appt scheduled w/Shadad on 1/24 at 2pm. Pt agreed to the appt date and time. Demographics verified. Letter mailed to the pt.

## 2016-11-23 ENCOUNTER — Telehealth: Payer: Self-pay

## 2016-11-23 NOTE — Telephone Encounter (Signed)
Fax request rcvd from [facility] with request to change Trintellix to something cheaper. Copay for this is $179.80. Thank you, Wells Guiles

## 2016-11-23 NOTE — Telephone Encounter (Signed)
If he wants to switch, have him come in to discuss this and bring in a list of psychiatric meds so that we can choose a less expensive one.

## 2016-11-23 NOTE — Telephone Encounter (Signed)
Left word for word message for pt 

## 2016-11-23 NOTE — Telephone Encounter (Signed)
Just make sure we have enough time to cover all of these issues. Probably half an hour

## 2016-11-23 NOTE — Telephone Encounter (Signed)
Pt said he has several other things to talk with you about like his sleeping and having to take a nap during the day he wanted me to ask if yall could talk to him about these things also before He made appointment

## 2016-12-06 ENCOUNTER — Ambulatory Visit (HOSPITAL_BASED_OUTPATIENT_CLINIC_OR_DEPARTMENT_OTHER): Payer: Medicare Other | Admitting: Oncology

## 2016-12-06 VITALS — BP 130/77 | HR 83 | Temp 97.5°F | Resp 18 | Ht 70.0 in | Wt 156.4 lb

## 2016-12-06 DIAGNOSIS — Z87891 Personal history of nicotine dependence: Secondary | ICD-10-CM

## 2016-12-06 DIAGNOSIS — C434 Malignant melanoma of scalp and neck: Secondary | ICD-10-CM

## 2016-12-06 DIAGNOSIS — Z807 Family history of other malignant neoplasms of lymphoid, hematopoietic and related tissues: Secondary | ICD-10-CM

## 2016-12-06 DIAGNOSIS — Z808 Family history of malignant neoplasm of other organs or systems: Secondary | ICD-10-CM | POA: Diagnosis not present

## 2016-12-06 DIAGNOSIS — C61 Malignant neoplasm of prostate: Secondary | ICD-10-CM

## 2016-12-06 NOTE — Progress Notes (Signed)
Reason for Referral: Melanoma.   HPI: 81 year old gentleman currently lives in [place] of [place]. She gentleman with history of coronary artery disease as well as prostate cancer. He was diagnosed with Gleason score 7 with a PSA of 5.08 in January 2014. He received definitive radiation therapy under the care of Dr. Valere Dross after receiving a total of 7800 cGy in 40 fractions with therapy concluded in June 2014. He was noted to have a lesion on his neck noted by his dentist in the last year. He was subsequently referred to dermatology and the biopsies taken of the right neck as well as 2 areas of the chest wall on the right side. All of these lesions supported the presence of melanoma. He was subsequently evaluated by Dr. [name] and underwent wide excision of the right neck melanoma as well as right medial chest melanoma as well as the right lateral chest skin lesion. This was completed on 10/24/2016. The final pathology revealed that his right medial chest wide excision did not reveal any abnormalities. His right lateral chest wide excision showed residual melanoma in situ with the original melanoma noted to be superficial spreading measuring 0.39 mm. The final pathological staging was T1a. The right neck excision showed a few atypical melanocyte although diagnostic melanoma was not seen. He recovered well from his operation without any recent complications.  He denied any previous history of skin cancer or any excessive skin damage previously. He denies any constitutional symptoms or decline in his energy her performance status. He remains active continue to work in [employer].  He does not report any headaches, blurry vision, syncope or seizures. He does not report any fevers, chills, sweats or weight loss. He does not report any chest pain, palpitation, orthopnea or leg edema. He does not report any cough, wheezing or hemoptysis. He does not report any nausea, vomiting or abdominal pain. He does not  report any frequency urgency or hesitancy. He does not report any skeletal complaints. Remaining review of systems unremarkable.   Past Medical History:  Diagnosis Date  . Anginal pain ([place])   . Aortic stenosis    a. peak to peak gradient by LHC 8/16:  37 mmHg (moderately severe)   . CAD (coronary artery disease)    a.  LHC 8/16: Mid to distal LAD 30%, OM1 40%, proximal mid RCA 40%, distal RCA 60% >> FFR 0.69  >> PCI: 3 x 15 mm Resolute DES  . Depression   . Esophageal reflux   . Family history of adverse reaction to anesthesia    "daughter has PONV"  . H/O blood clots    "get them in my stool and urine" (11/12/2015)  . Heart murmur   . Hx of radiation therapy 04/14/13-06/09/13   prostate 7800 cGy, 40 sessions, seminal vesicles 5600 cGy 40 sessions  . Hyperlipidemia   . Malignant melanoma in situ ([place]) 09/04/2016   Right neck and chest  . Prostate cancer ([place]) dx'd 2014  . Radiation proctitis   :  Past Surgical History:  Procedure Laterality Date  . CARDIAC CATHETERIZATION N/A 04/21/2015   Procedure: Right/Left Heart Cath and Coronary Angiography;  Surgeon: [name], MD;  Location: [facility];  Service: Cardiovascular;  Laterality: N/A;  . CARDIAC CATHETERIZATION N/A 06/18/2015   Procedure: Intravascular Pressure Wire/FFR Study;  Surgeon: [name], MD;  Location: [facility];  Service: Cardiovascular;  Laterality: N/A;  . CARDIAC CATHETERIZATION N/A 06/18/2015   Procedure: Coronary Stent Intervention;  Surgeon:  [name], MD;  Location: [facility];  Service: Cardiovascular;  Laterality: N/A;  . CARDIAC CATHETERIZATION N/A 06/18/2015   Procedure: Right/Left Heart Cath and Coronary Angiography;  Surgeon: [name], MD;  Location: [facility];  Service: Cardiovascular;  Laterality: N/A;  . CARDIAC CATHETERIZATION N/A 06/23/2015   Procedure: Left Heart Cath and Cors/Grafts Angiography;  Surgeon: [name], MD;  Location: [facility];   Service: Cardiovascular;  Laterality: N/A;  . CARDIAC CATHETERIZATION N/A 01/17/2016   Procedure: Right/Left Heart Cath and Coronary Angiography;  Surgeon: [name], MD;  Location: [facility];  Service: Cardiovascular;  Laterality: N/A;  . COLONOSCOPY    . FRACTURE SURGERY    . INGUINAL HERNIA REPAIR    . LAPAROSCOPIC APPENDECTOMY N/A 11/16/2015   Procedure: APPENDECTOMY LAPAROSCOPIC;  Surgeon: [name], MD;  Location: [facility];  Service: General;  Laterality: N/A;  . MELANOMA EXCISION Right 10/24/2016   Procedure: WIDE EXCISION MELANOMA RIGHT NECK AND RIGHT CHEST TIMES 2;  Surgeon: [name], MD;  Location: [facility];  Service: General;  Laterality: Right;  right medial and lateral lesion of chest and right neck  . NASAL FRACTURE SURGERY     "broken years ago; several ORs to correct it"  . NASAL SEPTUM SURGERY    . PROSTATE BIOPSY  2014   "needle biopsy"  . TEE WITHOUT CARDIOVERSION N/A 02/15/2016   Procedure: TRANSESOPHAGEAL ECHOCARDIOGRAM (TEE);  Surgeon: [name], MD;  Location: [facility];  Service: Open Heart Surgery;  Laterality: N/A;  . TONSILLECTOMY    . TRANSCATHETER AORTIC VALVE REPLACEMENT, TRANSFEMORAL  02/15/2016  . TRANSCATHETER AORTIC VALVE REPLACEMENT, TRANSFEMORAL N/A 02/15/2016   Procedure: TRANSCATHETER AORTIC VALVE REPLACEMENT, TRANSFEMORAL;  Surgeon: [name], MD;  Location: [facility];  Service: Open Heart Surgery;  Laterality: N/A;  :   Current Outpatient Prescriptions:  .  acetaminophen (TYLENOL) 500 MG tablet, Take 500 mg by mouth every 8 (eight) hours as needed for mild pain. Reported on 11/26/2015, Disp: , Rfl:  .  aspirin EC 81 MG tablet, Take 81 mg by mouth daily., Disp: , Rfl:  .  DULoxetine (CYMBALTA) 30 MG capsule, Take 1 capsule (30 mg total) by mouth daily., Disp: 30 capsule, Rfl: 1 .  Fe Fum-FA-B Cmp-C-Zn-Mg-Mn-Cu (HEMOCYTE-PLUS) 106-1 MG TABS, Take 1 tablet by mouth at bedtime., Disp: , Rfl:  .  HYDROcodone-acetaminophen  (NORCO/VICODIN) 5-325 MG tablet, Take 1 tablet by mouth every 6 (six) hours as needed for moderate pain., Disp: , Rfl:  .  nitroGLYCERIN (NITROSTAT) 0.4 MG SL tablet, Place 1 tablet (0.4 mg total) under the tongue every 5 (five) minutes as needed for chest pain., Disp: 25 tablet, Rfl: 3 .  rosuvastatin (CRESTOR) 10 MG tablet, TAKE 1 TABLET ONCE DAILY., Disp: 30 tablet, Rfl: 1 .  vortioxetine HBr (TRINTELLIX) 10 MG TABS, Take 1 tablet (10 mg total) by mouth daily., Disp: 30 tablet, Rfl: 1:  Not on File:  Family History  Problem Relation Age of Onset  . Brain cancer Father   . Lymphoma Mother   . Depression Daughter   :  Social History   Social History  . Marital status: Married    Spouse name: N/A  . Number of children: 2  . Years of education: N/A   Occupational History  . Not on file.   Social History Main Topics  . Smoking status: Former Smoker    Packs/day: 0.00    Types: Cigarettes  .  Smokeless tobacco: Never Used     Comment: "quit smoking cigarettes in the 1960s; quit smoking cigars in the 1990s  . Alcohol use Yes     Comment: 2 DRINKS PER DAY  . Drug use: No  . Sexual activity: Not on file   Other Topics Concern  . Not on file   Social History Narrative  . No narrative on file  :  Pertinent items are noted in HPI.  Exam: Blood pressure 130/77, pulse 83, temperature 97.5 F (36.4 C), temperature source Oral, resp. rate 18, height 5\' 10"  (1.778 m), weight 156 lb 6.4 oz (70.9 kg), SpO2 99 %. General appearance: alert and cooperative Head: Normocephalic, without obvious abnormality Throat: lips, mucosa, and tongue normal; teeth and gums normal Neck: no adenopathy. Well-healed scar noted. Back: negative Resp: clear to auscultation bilaterally Chest wall: no tenderness. Well healed incision and noted in the right anterior and lateral chest wall. Cardio: regular rate and rhythm, S1, S2 normal, no murmur, click, rub or gallop GI: soft, non-tender; bowel sounds  normal; no masses,  no organomegaly Extremities: extremities normal, atraumatic, no cyanosis or edema Skin: Skin color, texture, turgor normal. Nevi noted with generalized distributions pressure in the trunk and back.   CBC    Component Value Date/Time   WBC 5.0 02/20/2016 1559   RBC 3.88 (L) 02/20/2016 1559   HGB 11.9 (L) 02/20/2016 1559   HCT 35.6 (L) 02/20/2016 1559   PLT 185 02/20/2016 1559   MCV 91.8 02/20/2016 1559   MCH 30.7 02/20/2016 1559   MCHC 33.4 02/20/2016 1559   RDW 14.3 02/20/2016 1559   LYMPHSABS 0.9 11/16/2015 0545   MONOABS 0.8 11/16/2015 0545   EOSABS 0.2 11/16/2015 0545   BASOSABS 0.0 11/16/2015 0545     Chemistry      Component Value Date/Time   NA 137 02/20/2016 1559   K 4.0 02/20/2016 1559   CL 101 02/20/2016 1559   CO2 27 02/20/2016 1559   BUN 12 02/20/2016 1559   CREATININE 0.93 02/20/2016 1559   CREATININE 0.91 01/05/2016 1238      Component Value Date/Time   CALCIUM 9.2 02/20/2016 1559   ALKPHOS 76 02/20/2016 1559   AST 26 02/20/2016 1559   ALT 18 02/20/2016 1559   BILITOT 0.7 02/20/2016 1559        Assessment and Plan:   81 year old gentleman with the following issues:  1. 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 This was diagnosed in December 2017 and he underwent wide excision completed on 10/24/2016. The final pathology revealed a 0.39 mm Clark's level was IV superficial spreading melanoma. No ulceration was noted with very low mitotic index. The final pathological staging was T1 pain. No lymph node sampling was done.  He did have 2 other areas of concern including the right medial chest which showed melanoma in situ previously but no invasive melanoma. His right neck lesion showed a few atypical melanocytes. Diagnosis of melanoma was not seen.  The natural course of this disease was discussed with the patient today. His case was also reviewed in the multidisciplinary melanoma tumor board. No additional  therapy is needed at this point given the early stage melanoma as that has been detected. He isn't at risk however given his diffuse nevi of developing another melanoma.  I have recommended continued active surveillance with close follow up with dermatology every 3-6 months. I have recommended oncology follow up once every 6 months at least for the first  2 years. We also discussed strategies to improve his skin protection which includes wearing hats, long sleeves as well as sun block. There is no role for adjuvant therapy at this time.  2. Prostate cancer: Diagnosed in 2014 with PSA of 5.08 and a Gleason score 7. He is status post radiation therapy completed in July 2014. He follows with Dr. Junious Silk regarding this issue.  3. Follow-up: Be in 6 months.

## 2016-12-15 ENCOUNTER — Encounter: Payer: Self-pay | Admitting: Cardiovascular Disease

## 2016-12-15 ENCOUNTER — Ambulatory Visit (INDEPENDENT_AMBULATORY_CARE_PROVIDER_SITE_OTHER): Payer: Medicare Other | Admitting: Cardiovascular Disease

## 2016-12-15 VITALS — BP 118/68 | HR 80 | Ht 70.0 in | Wt 154.0 lb

## 2016-12-15 DIAGNOSIS — I350 Nonrheumatic aortic (valve) stenosis: Secondary | ICD-10-CM

## 2016-12-15 DIAGNOSIS — I209 Angina pectoris, unspecified: Secondary | ICD-10-CM

## 2016-12-15 DIAGNOSIS — I25118 Atherosclerotic heart disease of native coronary artery with other forms of angina pectoris: Secondary | ICD-10-CM

## 2016-12-15 DIAGNOSIS — Z952 Presence of prosthetic heart valve: Secondary | ICD-10-CM

## 2016-12-15 MED ORDER — ISOSORBIDE MONONITRATE ER 30 MG PO TB24
ORAL_TABLET | ORAL | 3 refills | Status: DC
Start: 2016-12-15 — End: 2017-04-12

## 2016-12-15 NOTE — Progress Notes (Signed)
Cardiology Office Note Date:  12/15/2016   ID:  Corey Oneill, DOB Dec 31, 1935, MRN [id number]  PCP:  [name], MD  Cardiologist:  [name], MD    Chief Complaint  Patient presents with  . Chest Pain   History of Present Illness: Corey Oneill is a 81 y.o. male who presents for evaluation of chest pain.   He called in with complaints of chest pain and is added on today for evaluation. He complains of a pressure-like sensation across the lower chest, sometimes associated with physical exertion such as walking his dog. No shortness of breath, edema, palpitations, lightheadedness, or syncope.   He underwent TAVR on 02/15/2016 via a percutaneous right transfemoral approach. His post-operative course was uncomplicated. He has also undergone PCI of the RCA and had re-look catheterization for recurrent sx's demonstrating patency of his stent.   Past Medical History:  Diagnosis Date  . Anginal pain ([place])   . Aortic stenosis    a. peak to peak gradient by LHC 8/16:  37 mmHg (moderately severe)   . CAD (coronary artery disease)    a.  LHC 8/16: Mid to distal LAD 30%, OM1 40%, proximal mid RCA 40%, distal RCA 60% >> FFR 0.69  >> PCI: 3 x 15 mm Resolute DES  . Depression   . Esophageal reflux   . Family history of adverse reaction to anesthesia    "daughter has PONV"  . H/O blood clots    "get them in my stool and urine" (11/12/2015)  . Heart murmur   . Hx of radiation therapy 04/14/13-06/09/13   prostate 7800 cGy, 40 sessions, seminal vesicles 5600 cGy 40 sessions  . Hyperlipidemia   . Malignant melanoma in situ ([place]) 09/04/2016   Right neck and chest  . Prostate cancer ([place]) dx'd 2014  . Radiation proctitis     Past Surgical History:  Procedure Laterality Date  . CARDIAC CATHETERIZATION N/A 04/21/2015   Procedure: Right/Left Heart Cath and Coronary Angiography;  Surgeon: [name], MD;  Location: [facility];  Service: Cardiovascular;  Laterality: N/A;  .  CARDIAC CATHETERIZATION N/A 06/18/2015   Procedure: Intravascular Pressure Wire/FFR Study;  Surgeon: [name], MD;  Location: [facility];  Service: Cardiovascular;  Laterality: N/A;  . CARDIAC CATHETERIZATION N/A 06/18/2015   Procedure: Coronary Stent Intervention;  Surgeon: [name], MD;  Location: [facility];  Service: Cardiovascular;  Laterality: N/A;  . CARDIAC CATHETERIZATION N/A 06/18/2015   Procedure: Right/Left Heart Cath and Coronary Angiography;  Surgeon: [name], MD;  Location: [facility];  Service: Cardiovascular;  Laterality: N/A;  . CARDIAC CATHETERIZATION N/A 06/23/2015   Procedure: Left Heart Cath and Cors/Grafts Angiography;  Surgeon: [name], MD;  Location: [facility];  Service: Cardiovascular;  Laterality: N/A;  . CARDIAC CATHETERIZATION N/A 01/17/2016   Procedure: Right/Left Heart Cath and Coronary Angiography;  Surgeon: [name], MD;  Location: [facility];  Service: Cardiovascular;  Laterality: N/A;  . COLONOSCOPY    . FRACTURE SURGERY    . INGUINAL HERNIA REPAIR    . LAPAROSCOPIC APPENDECTOMY N/A 11/16/2015   Procedure: APPENDECTOMY LAPAROSCOPIC;  Surgeon: [name], MD;  Location: [facility];  Service: General;  Laterality: N/A;  . MELANOMA EXCISION Right 10/24/2016   Procedure: WIDE EXCISION MELANOMA RIGHT NECK AND RIGHT CHEST TIMES 2;  Surgeon: [name], MD;  Location: [facility];  Service: General;  Laterality: Right;  right medial and lateral lesion of chest and right neck  . NASAL FRACTURE SURGERY     "broken years ago; several ORs to correct it"  . NASAL SEPTUM SURGERY    . PROSTATE BIOPSY  2014   "needle biopsy"  . TEE WITHOUT CARDIOVERSION N/A 02/15/2016   Procedure: TRANSESOPHAGEAL ECHOCARDIOGRAM (TEE);  Surgeon: [name], MD;  Location: [facility];  Service: Open Heart Surgery;  Laterality: N/A;  . TONSILLECTOMY    . TRANSCATHETER AORTIC VALVE REPLACEMENT, TRANSFEMORAL  02/15/2016    . TRANSCATHETER AORTIC VALVE REPLACEMENT, TRANSFEMORAL N/A 02/15/2016   Procedure: TRANSCATHETER AORTIC VALVE REPLACEMENT, TRANSFEMORAL;  Surgeon: [name], MD;  Location: [facility];  Service: Open Heart Surgery;  Laterality: N/A;    Current Outpatient Prescriptions  Medication Sig Dispense Refill  . acetaminophen (TYLENOL) 500 MG tablet Take 500 mg by mouth every 8 (eight) hours as needed for mild pain. Reported on 11/26/2015    . aspirin EC 81 MG tablet Take 81 mg by mouth daily.    . DULoxetine (CYMBALTA) 30 MG capsule Take 1 capsule (30 mg total) by mouth daily. 30 capsule 1  . nitroGLYCERIN (NITROSTAT) 0.4 MG SL tablet Place 1 tablet (0.4 mg total) under the tongue every 5 (five) minutes as needed for chest pain. 25 tablet 3  . rosuvastatin (CRESTOR) 10 MG tablet TAKE 1 TABLET ONCE DAILY. 30 tablet 1  . isosorbide mononitrate (IMDUR) 30 MG 24 hr tablet Take one-half tablet by mouth daily 45 tablet 3   No current facility-administered medications for this visit.     Allergies:   Patient has no known allergies.   Social History:  The patient  reports that he has quit smoking. His smoking use included Cigarettes. He smoked 0.00 packs per day. He has never used smokeless tobacco. He reports that he drinks alcohol. He reports that he does not use drugs.   Family History:  The patient's  family history includes Brain cancer in his father; Depression in his daughter; Lymphoma in his mother.   ROS:  Please see the history of present illness.  Otherwise, review of systems is positive for hearing loss, depression, excessive fatigue, balance problems.  All other systems are reviewed and negative.   PHYSICAL EXAM: VS:  BP 118/68   Pulse 80   Ht 5\' 10"  (1.778 m)   Wt 154 lb (69.9 kg)   SpO2 97%   BMI 22.10 kg/m  , BMI Body mass index is 22.1 kg/m. GEN: Well nourished, well developed, in no acute distress  HEENT: normal  Neck: no JVD, no masses. Left carotid bruit Cardiac: RRR with 2/6  SEM at the RUSB            Respiratory:  clear to auscultation bilaterally, normal work of breathing GI: soft, nontender, nondistended, + BS MS: no deformity or atrophy  Ext: no pretibial edema, pedal pulses 2+= bilaterally Skin: warm and dry, no rash Neuro:  Strength and sensation are intact Psych: euthymic mood, full affect  EKG:  EKG is ordered today. The ekg ordered today shows NSR 80 bpm, RAE, otherwise within normal limits  Recent Labs: 02/16/2016: Magnesium 2.4 02/20/2016: ALT 18; BUN 12; Creatinine, Ser 0.93; Hemoglobin 11.9; Platelets 185; Potassium 4.0; Sodium 137   Lipid Panel     Component Value Date/Time   CHOL 155 06/18/2015 0234   TRIG 99 06/18/2015 0234   HDL 49 06/18/2015 0234   CHOLHDL 3.2 06/18/2015 0234   VLDL 20 06/18/2015 0234  [place] 86 06/18/2015 0234      Wt Readings from Last 3 Encounters:  12/15/16 154 lb (69.9 kg)  12/06/16 156 lb 6.4 oz (70.9 kg)  11/15/16 154 lb 3.2 oz (69.9 kg)     Cardiac Studies Reviewed: Cardiac Cath 01-17-2016: Conclusion   1. Patent LAD with diffuse calcification and mild nonobstructive disease 2. Moderate stenosis in the intermediate branch, stable from previous study 3. Mild RCA stenosis with patency of the previously implanted stent 4. Heavily calcified aortic valve with severe aortic stenosis, mean gradient 36 mmHg, calculated aortic valve area 1.07 cm  Continued evaluation for TAVR versus conventional AVR   2D Echo: Study Conclusions  - Left ventricle: The cavity size was normal. Wall thickness was   increased in a pattern of moderate LVH. Systolic function was   normal. The estimated ejection fraction was in the range of 55%   to 60%. Features are consistent with a pseudonormal left   ventricular filling pattern, with concomitant abnormal relaxation   and increased filling pressure (grade 2 diastolic dysfunction). - Aortic valve: A bioprosthesis was present. There was trivial   regurgitation. Valve area  (VTI): 2.48 cm^2. Valve area (Vmax):   2.72 cm^2. Valve area (Vmean): 2.53 cm^2. - Right atrium: The atrium was mildly to moderately dilated.  ASSESSMENT AND PLAN: 1.  CAD, native vessel, with angina. I suspect the stress he is dealing with related to his job is playing a major role in this. We discussed this at length today. He does have CAD and prior PCI. I reviewed his cath images form 2017. Favor addition of imdur 15 mg daily. Will check exercise Myoview stress scan to evaluate for significant ischemia.   2. Aortic valve disease s/p TAVR: normal exam. Due for 12 month echo FU in near future - will arrange.   3. Hyperlipidemia: on crestor. Treated by his PCP.   Current medicines are reviewed with the patient today.  The patient does not have concerns regarding medicines.  Labs/ tests ordered today include:   Orders Placed This Encounter  Procedures  . Myocardial Perfusion Imaging  . EKG 12-Lead    Disposition:   FU 6 months  Signed, [name], MD  12/15/2016 5:14 PM    [facility] [place], [place], [place]  [address] Phone: [phone]; Fax: [phone]

## 2016-12-15 NOTE — Patient Instructions (Signed)
Medication Instructions:  Your physician has recommended you make the following change in your medication:  1. START Isosorbide MN 30mg  take one-half tablet by mouth daily  Labwork: No new orders.   Testing/Procedures: Your physician has requested that you have an exercise stress myoview. For further information please visit [email or web address]. Please follow instruction sheet, as given.  Follow-Up: Keep scheduled appointment for Echocardiogram.   Your physician wants you to follow-up in: 6 MONTHS with Dr Burt Knack.  You will receive a reminder letter in the mail two months in advance. If you don't receive a letter, please call our office to schedule the follow-up appointment.   Any Other Special Instructions Will Be Listed Below (If Applicable).     If you need a refill on your cardiac medications before your next appointment, please call your pharmacy.

## 2016-12-18 ENCOUNTER — Telehealth: Payer: Self-pay | Admitting: Oncology

## 2016-12-18 NOTE — Telephone Encounter (Signed)
Left a voicemail on cell phone and gave the number [phone] for any questions

## 2016-12-19 ENCOUNTER — Other Ambulatory Visit: Payer: Self-pay | Admitting: Family Medicine

## 2016-12-20 ENCOUNTER — Telehealth (HOSPITAL_COMMUNITY): Payer: Self-pay | Admitting: *Deleted

## 2016-12-20 NOTE — Telephone Encounter (Signed)
Patient given detailed instructions per Myocardial Perfusion Study Information Sheet for the test on  12/25/16. Patient notified to arrive 15 minutes early and that it is imperative to arrive on time for appointment to keep from having the test rescheduled.  If you need to cancel or reschedule your appointment, please call the office within 24 hours of your appointment. Failure to do so may result in a cancellation of your appointment, and a $50 no show fee. Patient verbalized understanding. Shine Scrogham Jacqueline    

## 2016-12-22 ENCOUNTER — Telehealth (HOSPITAL_COMMUNITY): Payer: Self-pay

## 2016-12-22 NOTE — Telephone Encounter (Signed)
Patient given detailed instructions per Myocardial Perfusion Study Information Sheet for the test on 12/25/2016 at 9:45. Patient notified to arrive 15 minutes early and that it is imperative to arrive on time for appointment to keep from having the test rescheduled.  If you need to cancel or reschedule your appointment, please call the office within 24 hours of your appointment. Failure to do so may result in a cancellation of your appointment, and a $50 no show fee. Patient verbalized understanding.EHK

## 2016-12-25 ENCOUNTER — Other Ambulatory Visit: Payer: Medicare Other | Admitting: *Deleted

## 2016-12-25 ENCOUNTER — Other Ambulatory Visit: Payer: Self-pay

## 2016-12-25 ENCOUNTER — Ambulatory Visit (HOSPITAL_COMMUNITY): Payer: Medicare Other | Attending: Internal Medicine

## 2016-12-25 DIAGNOSIS — I25118 Atherosclerotic heart disease of native coronary artery with other forms of angina pectoris: Secondary | ICD-10-CM

## 2016-12-25 DIAGNOSIS — R0789 Other chest pain: Secondary | ICD-10-CM | POA: Insufficient documentation

## 2016-12-25 DIAGNOSIS — R9430 Abnormal result of cardiovascular function study, unspecified: Secondary | ICD-10-CM

## 2016-12-25 DIAGNOSIS — I2510 Atherosclerotic heart disease of native coronary artery without angina pectoris: Secondary | ICD-10-CM | POA: Diagnosis not present

## 2016-12-25 DIAGNOSIS — R9439 Abnormal result of other cardiovascular function study: Secondary | ICD-10-CM

## 2016-12-25 DIAGNOSIS — Z952 Presence of prosthetic heart valve: Secondary | ICD-10-CM | POA: Diagnosis not present

## 2016-12-25 DIAGNOSIS — I350 Nonrheumatic aortic (valve) stenosis: Secondary | ICD-10-CM | POA: Diagnosis not present

## 2016-12-25 LAB — MYOCARDIAL PERFUSION IMAGING
CHL CUP MPHR: 142 {beats}/min
Estimated workload: 7 METS
Exercise duration (min): 6 min
Exercise duration (sec): 30 s
LHR: 0.26
LV sys vol: 24 mL
LVDIAVOL: 74 mL (ref 62–150)
NUC STRESS TID: 0.84
Peak HR: 139 {beats}/min
Percent HR: 97 %
Rest HR: 65 {beats}/min
SDS: 4
SRS: 5
SSS: 7

## 2016-12-25 MED ORDER — TECHNETIUM TC 99M TETROFOSMIN IV KIT
30.7000 | PACK | Freq: Once | INTRAVENOUS | Status: AC | PRN
  Administered 2016-12-25: 30.7 via INTRAVENOUS
  Filled 2016-12-25: qty 31

## 2016-12-25 MED ORDER — TECHNETIUM TC 99M TETROFOSMIN IV KIT
10.4000 | PACK | Freq: Once | INTRAVENOUS | Status: AC | PRN
  Administered 2016-12-25: 10.4 via INTRAVENOUS
  Filled 2016-12-25: qty 11

## 2016-12-25 MED ORDER — CARVEDILOL 3.125 MG PO TABS: 3.1250 mg | ORAL_TABLET | Freq: Two times a day (BID) | ORAL | 3 refills | Status: DC

## 2016-12-25 NOTE — Addendum Note (Signed)
Addended by: Dorothy Spark on: 12/25/2016 04:15 PM   Modules accepted: Orders, SmartSet

## 2016-12-26 ENCOUNTER — Encounter (HOSPITAL_COMMUNITY)
Admission: RE | Disposition: A | Discharge status: Home / Self Care | Payer: Self-pay | Source: Ambulatory Visit | Attending: Cardiovascular Disease

## 2016-12-26 ENCOUNTER — Ambulatory Visit (HOSPITAL_COMMUNITY)
Admission: RE | Admit: 2016-12-26 | Discharge: 2016-12-26 | Disposition: A | Discharge status: Home / Self Care | Payer: Medicare Other | Source: Ambulatory Visit | Attending: Cardiovascular Disease | Admitting: Cardiovascular Disease

## 2016-12-26 DIAGNOSIS — I350 Nonrheumatic aortic (valve) stenosis: Secondary | ICD-10-CM | POA: Insufficient documentation

## 2016-12-26 DIAGNOSIS — F329 Major depressive disorder, single episode, unspecified: Secondary | ICD-10-CM | POA: Insufficient documentation

## 2016-12-26 DIAGNOSIS — I2584 Coronary atherosclerosis due to calcified coronary lesion: Secondary | ICD-10-CM | POA: Diagnosis not present

## 2016-12-26 DIAGNOSIS — Z955 Presence of coronary angioplasty implant and graft: Secondary | ICD-10-CM | POA: Diagnosis not present

## 2016-12-26 DIAGNOSIS — D696 Thrombocytopenia, unspecified: Secondary | ICD-10-CM | POA: Insufficient documentation

## 2016-12-26 DIAGNOSIS — Z87891 Personal history of nicotine dependence: Secondary | ICD-10-CM | POA: Insufficient documentation

## 2016-12-26 DIAGNOSIS — Z7982 Long term (current) use of aspirin: Secondary | ICD-10-CM | POA: Insufficient documentation

## 2016-12-26 DIAGNOSIS — I25118 Atherosclerotic heart disease of native coronary artery with other forms of angina pectoris: Secondary | ICD-10-CM | POA: Insufficient documentation

## 2016-12-26 DIAGNOSIS — K219 Gastro-esophageal reflux disease without esophagitis: Secondary | ICD-10-CM | POA: Diagnosis not present

## 2016-12-26 DIAGNOSIS — I2510 Atherosclerotic heart disease of native coronary artery without angina pectoris: Secondary | ICD-10-CM | POA: Diagnosis present

## 2016-12-26 DIAGNOSIS — Z952 Presence of prosthetic heart valve: Secondary | ICD-10-CM | POA: Insufficient documentation

## 2016-12-26 DIAGNOSIS — E785 Hyperlipidemia, unspecified: Secondary | ICD-10-CM | POA: Diagnosis not present

## 2016-12-26 HISTORY — PX: LEFT HEART CATH AND CORONARY ANGIOGRAPHY: CATH118249

## 2016-12-26 LAB — CBC
HEMATOCRIT: 39.6 % (ref 39.0–52.0)
Hematocrit: 36.4 % — ABNORMAL LOW (ref 37.5–51.0)
Hemoglobin: 12.8 g/dL — ABNORMAL LOW (ref 13.0–17.7)
Hemoglobin: 13.2 g/dL (ref 13.0–17.0)
MCH: 31.5 pg (ref 26.6–33.0)
MCH: 31.7 pg (ref 26.0–34.0)
MCHC: 35.2 g/dL (ref 31.5–35.7)
MCHC: 33.3 g/dL (ref 30.0–36.0)
MCV: 90 fL (ref 79–97)
MCV: 95 fL (ref 78.0–100.0)
Platelets: 69 10*3/uL — CL (ref 150–379)
Platelets: 62 10*3/uL — ABNORMAL LOW (ref 150–400)
RBC: 4.06 x10E6/uL — ABNORMAL LOW (ref 4.14–5.80)
RBC: 4.17 MIL/uL — ABNORMAL LOW (ref 4.22–5.81)
RDW: 12.6 % (ref 12.3–15.4)
RDW: 12.7 % (ref 11.5–15.5)
WBC: 5 10*3/uL (ref 3.4–10.8)
WBC: 3.5 10*3/uL — ABNORMAL LOW (ref 4.0–10.5)

## 2016-12-26 LAB — BASIC METABOLIC PANEL
Anion gap: 5 (ref 5–15)
BUN/Creatinine Ratio: 16 (ref 10–24)
BUN: 13 mg/dL (ref 8–27)
BUN: 12 mg/dL (ref 6–20)
CALCIUM: 9 mg/dL (ref 8.9–10.3)
CHLORIDE: 106 mmol/L (ref 101–111)
CO2: 27 mmol/L (ref 18–29)
CO2: 30 mmol/L (ref 22–32)
Calcium: 8.6 mg/dL (ref 8.6–10.2)
Chloride: 101 mmol/L (ref 96–106)
Creatinine, Ser: 0.79 mg/dL (ref 0.76–1.27)
Creatinine, Ser: 0.86 mg/dL (ref 0.61–1.24)
GFR calc Af Amer: 99 mL/min/{1.73_m2} (ref >59)
GFR calc Af Amer: >60 mL/min (ref >60)
GFR calc non Af Amer: 86 mL/min/{1.73_m2} (ref >59)
GFR calc non Af Amer: >60 mL/min (ref >60)
GLUCOSE: 95 mg/dL (ref 65–99)
Glucose: 71 mg/dL (ref 65–99)
Potassium: 4.2 mmol/L (ref 3.5–5.2)
Potassium: 4.8 mmol/L (ref 3.5–5.1)
Sodium: 141 mmol/L (ref 134–144)
Sodium: 141 mmol/L (ref 135–145)

## 2016-12-26 LAB — PROTIME-INR
INR: 1.1 (ref 0.8–1.2)
INR: 1.09
Prothrombin Time: 11.3 s (ref 9.1–12.0)
Prothrombin Time: 14.1 seconds (ref 11.4–15.2)

## 2016-12-26 SURGERY — LEFT HEART CATH AND CORONARY ANGIOGRAPHY

## 2016-12-26 MED ORDER — SODIUM CHLORIDE 0.9% FLUSH: 3.0000 mL | INTRAVENOUS | Status: DC | PRN

## 2016-12-26 MED ORDER — HEPARIN SODIUM (PORCINE) 1000 UNIT/ML IJ SOLN
INTRAMUSCULAR | Status: AC
  Filled 2016-12-26: qty 1

## 2016-12-26 MED ORDER — SODIUM CHLORIDE 0.9 % IV SOLN: INTRAVENOUS | Status: DC

## 2016-12-26 MED ORDER — LIDOCAINE HCL (PF) 1 % IJ SOLN
INTRAMUSCULAR | Status: AC
  Filled 2016-12-26: qty 30

## 2016-12-26 MED ORDER — IOPAMIDOL (ISOVUE-370) INJECTION 76%
INTRAVENOUS | Status: AC
  Filled 2016-12-26: qty 100

## 2016-12-26 MED ORDER — SODIUM CHLORIDE 0.9 % IV SOLN: 250.0000 mL | INTRAVENOUS | Status: DC | PRN

## 2016-12-26 MED ORDER — IOPAMIDOL (ISOVUE-370) INJECTION 76%
INTRAVENOUS | Status: DC | PRN
  Administered 2016-12-26: 60 mL via INTRA_ARTERIAL

## 2016-12-26 MED ORDER — HEPARIN (PORCINE) IN NACL 2-0.9 UNIT/ML-% IJ SOLN
INTRAMUSCULAR | Status: AC
  Filled 2016-12-26: qty 500

## 2016-12-26 MED ORDER — LIDOCAINE HCL (PF) 1 % IJ SOLN
INTRAMUSCULAR | Status: DC | PRN
  Administered 2016-12-26: 4 mL

## 2016-12-26 MED ORDER — SODIUM CHLORIDE 0.9 % WEIGHT BASED INFUSION: 1.0000 mL/kg/h | INTRAVENOUS | Status: DC

## 2016-12-26 MED ORDER — VERAPAMIL HCL 2.5 MG/ML IV SOLN
INTRAVENOUS | Status: AC
Start: 2016-12-26 — End: 2016-12-26
  Filled 2016-12-26: qty 2

## 2016-12-26 MED ORDER — FENTANYL CITRATE (PF) 100 MCG/2ML IJ SOLN
INTRAMUSCULAR | Status: AC
  Filled 2016-12-26: qty 2

## 2016-12-26 MED ORDER — ONDANSETRON HCL 4 MG/2ML IJ SOLN: 4.0000 mg | Freq: Four times a day (QID) | INTRAMUSCULAR | Status: DC | PRN

## 2016-12-26 MED ORDER — HEPARIN (PORCINE) IN NACL 2-0.9 UNIT/ML-% IJ SOLN
INTRAMUSCULAR | Status: AC
  Filled 2016-12-26: qty 1000

## 2016-12-26 MED ORDER — ACETAMINOPHEN 325 MG PO TABS: 650.0000 mg | ORAL_TABLET | ORAL | Status: DC | PRN

## 2016-12-26 MED ORDER — VERAPAMIL HCL 2.5 MG/ML IV SOLN
INTRAVENOUS | Status: DC | PRN
  Administered 2016-12-26: 12:00:00 via INTRA_ARTERIAL

## 2016-12-26 MED ORDER — SODIUM CHLORIDE 0.9% FLUSH: 3.0000 mL | Freq: Two times a day (BID) | INTRAVENOUS | Status: DC

## 2016-12-26 MED ORDER — HEPARIN SODIUM (PORCINE) 1000 UNIT/ML IJ SOLN
INTRAMUSCULAR | Status: DC | PRN
  Administered 2016-12-26: 3500 [IU] via INTRAVENOUS

## 2016-12-26 MED ORDER — MIDAZOLAM HCL 2 MG/2ML IJ SOLN
INTRAMUSCULAR | Status: AC
  Filled 2016-12-26: qty 2

## 2016-12-26 MED ORDER — MIDAZOLAM HCL 2 MG/2ML IJ SOLN
INTRAMUSCULAR | Status: DC | PRN
  Administered 2016-12-26: 1 mg via INTRAVENOUS

## 2016-12-26 MED ORDER — ASPIRIN 81 MG PO CHEW
81.0000 mg | CHEWABLE_TABLET | ORAL | Status: AC
  Administered 2016-12-26: 81 mg via ORAL

## 2016-12-26 MED ORDER — FENTANYL CITRATE (PF) 100 MCG/2ML IJ SOLN
INTRAMUSCULAR | Status: DC | PRN
  Administered 2016-12-26: 25 ug via INTRAVENOUS

## 2016-12-26 MED ORDER — ASPIRIN 81 MG PO CHEW
CHEWABLE_TABLET | ORAL | Status: AC
  Administered 2016-12-26: 81 mg via ORAL
  Filled 2016-12-26: qty 1

## 2016-12-26 MED ORDER — SODIUM CHLORIDE 0.9 % WEIGHT BASED INFUSION
3.0000 mL/kg/h | INTRAVENOUS | Status: DC
  Administered 2016-12-26: 3 mL/kg/h via INTRAVENOUS

## 2016-12-26 MED ORDER — HEPARIN (PORCINE) IN NACL 2-0.9 UNIT/ML-% IJ SOLN
INTRAMUSCULAR | Status: DC | PRN
  Administered 2016-12-26: 1500 mL via INTRA_ARTERIAL

## 2016-12-26 MED ORDER — DIAZEPAM 5 MG PO TABS: 5.0000 mg | ORAL_TABLET | Freq: Four times a day (QID) | ORAL | Status: DC | PRN

## 2016-12-26 SURGICAL SUPPLY — 9 items
CATH OPTITORQUE TIG 4.0 5F (CATHETERS) ×2 IMPLANT
DEVICE RAD COMP TR BAND LRG (VASCULAR PRODUCTS) ×2 IMPLANT
GLIDESHEATH SLEND SS 6F .021 (SHEATH) ×2 IMPLANT
GUIDEWIRE INQWIRE 1.5J.035X260 (WIRE) IMPLANT
INQWIRE 1.5J .035X260CM (WIRE) ×3
KIT HEART LEFT (KITS) ×5 IMPLANT
PACK CARDIAC CATHETERIZATION (CUSTOM PROCEDURE TRAY) ×3 IMPLANT
TRANSDUCER W/STOPCOCK (MISCELLANEOUS) ×3 IMPLANT
TUBING CIL FLEX 10 FLL-RA (TUBING) ×3 IMPLANT

## 2016-12-26 NOTE — CV Procedure (Signed)
Preliminary catheterization report on Corey Oneill/ Date of procedure 12/26/2016.  Cardiac catheterization was performed via the right radial approach.  The patient's platelet count 62,000 and the plan was not to perform any intervention.  Cardiac catheterization revealed a normal short left main, mild 20% LAD narrowing, normal left circumflex: Artery, and mild calcification of the RCA with 30-40% proximal stenosis, 30% mid stenosis, and a patent stent in the distal RCA.  The patient is status post TAPVR and his valve appears well seated.  He tolerated the procedure well.  Plan for discharge home for out patient study.  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  Medical therapy for CAD.  Troy Sine, M.D., [facility] 12/26/2016

## 2016-12-26 NOTE — Interval H&P Note (Signed)
History and Physical Interval Note:  12/26/2016 11:30 AM  Corey Oneill  has presented today for surgery, with the diagnosis of abnormal mioview - angina  The various methods of treatment have been discussed with the patient and family. After consideration of risks, benefits and other options for treatment, the patient has consented to  Procedure(s): Left Heart Cath and Coronary Angiography (N/A) as a surgical intervention .  The patient's history has been reviewed, patient examined, no change in status, stable for surgery.  I have reviewed the patient's chart and labs.  Questions were answered to the patient's satisfaction.     Shelva Majestic

## 2016-12-26 NOTE — H&P (View-Only) (Signed)
Cardiology Office Note Date:  12/15/2016   ID:  Corey Oneill, DOB 09/25/1936, MRN [id number]  PCP:  [name], MD  Cardiologist:  [name], MD    Chief Complaint  Patient presents with  . Chest Pain   History of Present Illness: Corey Oneill is a 81 y.o. male who presents for evaluation of chest pain.   He called in with complaints of chest pain and is added on today for evaluation. He complains of a pressure-like sensation across the lower chest, sometimes associated with physical exertion such as walking his dog. No shortness of breath, edema, palpitations, lightheadedness, or syncope.   He underwent TAVR on 02/15/2016 via a percutaneous right transfemoral approach. His post-operative course was uncomplicated. He has also undergone PCI of the RCA and had re-look catheterization for recurrent sx's demonstrating patency of his stent.   Past Medical History:  Diagnosis Date  . Anginal pain ([place])   . Aortic stenosis    a. peak to peak gradient by LHC 8/16:  37 mmHg (moderately severe)   . CAD (coronary artery disease)    a.  LHC 8/16: Mid to distal LAD 30%, OM1 40%, proximal mid RCA 40%, distal RCA 60% >> FFR 0.69  >> PCI: 3 x 15 mm Resolute DES  . Depression   . Esophageal reflux   . Family history of adverse reaction to anesthesia    "daughter has PONV"  . H/O blood clots    "get them in my stool and urine" (11/12/2015)  . Heart murmur   . Hx of radiation therapy 04/14/13-06/09/13   prostate 7800 cGy, 40 sessions, seminal vesicles 5600 cGy 40 sessions  . Hyperlipidemia   . Malignant melanoma in situ ([place]) 09/04/2016   Right neck and chest  . Prostate cancer ([place]) dx'd 2014  . Radiation proctitis     Past Surgical History:  Procedure Laterality Date  . CARDIAC CATHETERIZATION N/A 04/21/2015   Procedure: Right/Left Heart Cath and Coronary Angiography;  Surgeon: [name], MD;  Location: [facility];  Service: Cardiovascular;  Laterality: N/A;  .  CARDIAC CATHETERIZATION N/A 06/18/2015   Procedure: Intravascular Pressure Wire/FFR Study;  Surgeon: [name], MD;  Location: [facility];  Service: Cardiovascular;  Laterality: N/A;  . CARDIAC CATHETERIZATION N/A 06/18/2015   Procedure: Coronary Stent Intervention;  Surgeon: [name], MD;  Location: [facility];  Service: Cardiovascular;  Laterality: N/A;  . CARDIAC CATHETERIZATION N/A 06/18/2015   Procedure: Right/Left Heart Cath and Coronary Angiography;  Surgeon: [name], MD;  Location: [facility];  Service: Cardiovascular;  Laterality: N/A;  . CARDIAC CATHETERIZATION N/A 06/23/2015   Procedure: Left Heart Cath and Cors/Grafts Angiography;  Surgeon: [name], MD;  Location: [facility];  Service: Cardiovascular;  Laterality: N/A;  . CARDIAC CATHETERIZATION N/A 01/17/2016   Procedure: Right/Left Heart Cath and Coronary Angiography;  Surgeon: [name], MD;  Location: [facility];  Service: Cardiovascular;  Laterality: N/A;  . COLONOSCOPY    . FRACTURE SURGERY    . INGUINAL HERNIA REPAIR    . LAPAROSCOPIC APPENDECTOMY N/A 11/16/2015   Procedure: APPENDECTOMY LAPAROSCOPIC;  Surgeon: [name], MD;  Location: [facility];  Service: General;  Laterality: N/A;  . MELANOMA EXCISION Right 10/24/2016   Procedure: WIDE EXCISION MELANOMA RIGHT NECK AND RIGHT CHEST TIMES 2;  Surgeon: [name], MD;  Location: [facility];  Service: General;  Laterality: Right;  right medial and lateral lesion of chest and right neck  . NASAL FRACTURE SURGERY     "broken years ago; several ORs to correct it"  . NASAL SEPTUM SURGERY    . PROSTATE BIOPSY  2014   "needle biopsy"  . TEE WITHOUT CARDIOVERSION N/A 02/15/2016   Procedure: TRANSESOPHAGEAL ECHOCARDIOGRAM (TEE);  Surgeon: [name], MD;  Location: [facility];  Service: Open Heart Surgery;  Laterality: N/A;  . TONSILLECTOMY    . TRANSCATHETER AORTIC VALVE REPLACEMENT, TRANSFEMORAL  02/15/2016    . TRANSCATHETER AORTIC VALVE REPLACEMENT, TRANSFEMORAL N/A 02/15/2016   Procedure: TRANSCATHETER AORTIC VALVE REPLACEMENT, TRANSFEMORAL;  Surgeon: [name], MD;  Location: [facility];  Service: Open Heart Surgery;  Laterality: N/A;    Current Outpatient Prescriptions  Medication Sig Dispense Refill  . acetaminophen (TYLENOL) 500 MG tablet Take 500 mg by mouth every 8 (eight) hours as needed for mild pain. Reported on 11/26/2015    . aspirin EC 81 MG tablet Take 81 mg by mouth daily.    . DULoxetine (CYMBALTA) 30 MG capsule Take 1 capsule (30 mg total) by mouth daily. 30 capsule 1  . nitroGLYCERIN (NITROSTAT) 0.4 MG SL tablet Place 1 tablet (0.4 mg total) under the tongue every 5 (five) minutes as needed for chest pain. 25 tablet 3  . rosuvastatin (CRESTOR) 10 MG tablet TAKE 1 TABLET ONCE DAILY. 30 tablet 1  . isosorbide mononitrate (IMDUR) 30 MG 24 hr tablet Take one-half tablet by mouth daily 45 tablet 3   No current facility-administered medications for this visit.     Allergies:   Patient has no known allergies.   Social History:  The patient  reports that he has quit smoking. His smoking use included Cigarettes. He smoked 0.00 packs per day. He has never used smokeless tobacco. He reports that he drinks alcohol. He reports that he does not use drugs.   Family History:  The patient's  family history includes Brain cancer in his father; Depression in his daughter; Lymphoma in his mother.   ROS:  Please see the history of present illness.  Otherwise, review of systems is positive for hearing loss, depression, excessive fatigue, balance problems.  All other systems are reviewed and negative.   PHYSICAL EXAM: VS:  BP 118/68   Pulse 80   Ht 5\' 10"  (1.778 m)   Wt 154 lb (69.9 kg)   SpO2 97%   BMI 22.10 kg/m  , BMI Body mass index is 22.1 kg/m. GEN: Well nourished, well developed, in no acute distress  HEENT: normal  Neck: no JVD, no masses. Left carotid bruit Cardiac: RRR with 2/6  SEM at the RUSB            Respiratory:  clear to auscultation bilaterally, normal work of breathing GI: soft, nontender, nondistended, + BS MS: no deformity or atrophy  Ext: no pretibial edema, pedal pulses 2+= bilaterally Skin: warm and dry, no rash Neuro:  Strength and sensation are intact Psych: euthymic mood, full affect  EKG:  EKG is ordered today. The ekg ordered today shows NSR 80 bpm, RAE, otherwise within normal limits  Recent Labs: 02/16/2016: Magnesium 2.4 02/20/2016: ALT 18; BUN 12; Creatinine, Ser 0.93; Hemoglobin 11.9; Platelets 185; Potassium 4.0; Sodium 137   Lipid Panel     Component Value Date/Time   CHOL 155 06/18/2015 0234   TRIG 99 06/18/2015 0234   HDL 49 06/18/2015 0234   CHOLHDL 3.2 06/18/2015 0234   VLDL 20 06/18/2015 0234  [place] 86 06/18/2015 0234      Wt Readings from Last 3 Encounters:  12/15/16 154 lb (69.9 kg)  12/06/16 156 lb 6.4 oz (70.9 kg)  11/15/16 154 lb 3.2 oz (69.9 kg)     Cardiac Studies Reviewed: Cardiac Cath 01-17-2016: Conclusion   1. Patent LAD with diffuse calcification and mild nonobstructive disease 2. Moderate stenosis in the intermediate branch, stable from previous study 3. Mild RCA stenosis with patency of the previously implanted stent 4. Heavily calcified aortic valve with severe aortic stenosis, mean gradient 36 mmHg, calculated aortic valve area 1.07 cm  Continued evaluation for TAVR versus conventional AVR   2D Echo: Study Conclusions  - Left ventricle: The cavity size was normal. Wall thickness was   increased in a pattern of moderate LVH. Systolic function was   normal. The estimated ejection fraction was in the range of 55%   to 60%. Features are consistent with a pseudonormal left   ventricular filling pattern, with concomitant abnormal relaxation   and increased filling pressure (grade 2 diastolic dysfunction). - Aortic valve: A bioprosthesis was present. There was trivial   regurgitation. Valve area  (VTI): 2.48 cm^2. Valve area (Vmax):   2.72 cm^2. Valve area (Vmean): 2.53 cm^2. - Right atrium: The atrium was mildly to moderately dilated.  ASSESSMENT AND PLAN: 1.  CAD, native vessel, with angina. I suspect the stress he is dealing with related to his job is playing a major role in this. We discussed this at length today. He does have CAD and prior PCI. I reviewed his cath images form 2017. Favor addition of imdur 15 mg daily. Will check exercise Myoview stress scan to evaluate for significant ischemia.   2. Aortic valve disease s/p TAVR: normal exam. Due for 12 month echo FU in near future - will arrange.   3. Hyperlipidemia: on crestor. Treated by his PCP.   Current medicines are reviewed with the patient today.  The patient does not have concerns regarding medicines.  Labs/ tests ordered today include:   Orders Placed This Encounter  Procedures  . Myocardial Perfusion Imaging  . EKG 12-Lead    Disposition:   FU 6 months  Signed, [name], MD  12/15/2016 5:14 PM    [facility] [place], [place], [place]  [address] Phone: [phone]; Fax: [phone]

## 2016-12-26 NOTE — Discharge Instructions (Signed)

## 2016-12-27 ENCOUNTER — Telehealth: Payer: Self-pay | Admitting: Cardiovascular Disease

## 2016-12-27 ENCOUNTER — Telehealth: Payer: Self-pay | Admitting: *Deleted

## 2016-12-27 ENCOUNTER — Other Ambulatory Visit: Payer: Self-pay | Admitting: Oncology

## 2016-12-27 DIAGNOSIS — D696 Thrombocytopenia, unspecified: Secondary | ICD-10-CM

## 2016-12-27 NOTE — Telephone Encounter (Signed)
New message      Pt request to talk to the nurse.  He said she knew what he wanted.

## 2016-12-27 NOTE — Telephone Encounter (Signed)
"  Sonya with [facility] [phone]) calling about urgent referral for appointment for this patient.  Platelet count = 62 K/uL yesterday.  He needs an appointment for F/U soon, this week.  I'm calling to F/U on our request sent in [facility] referrals."  Will notify Dr. Alen Blew.

## 2016-12-27 NOTE — Telephone Encounter (Signed)
I spoke with the pt and made him aware of findings from cardiac catheterization on 12/26/16 per Dr Evette Georges note: Cardiac catheterization revealed a normal short left main, mild 20% LAD narrowing, normal left circumflex: Artery, and mild calcification of the RCA with 30-40% proximal stenosis, 30% mid stenosis, and a patent stent in the distal RCA.  The patient is status post TAPVR and his valve appears well seated.  He tolerated the procedure well.  The pt's platelet count is also down to 62,000 per pre-procedure labs.  The pt has not had evaluation by hematology and a formal request was placed for hematology consult.     Currently the pt does not have any additional post hospital follow-up arranged.

## 2016-12-27 NOTE — Telephone Encounter (Signed)
Message to scheduling sent.

## 2016-12-28 ENCOUNTER — Encounter (HOSPITAL_COMMUNITY): Payer: Self-pay | Admitting: Cardiovascular Disease

## 2016-12-28 ENCOUNTER — Telehealth: Payer: Self-pay | Admitting: Oncology

## 2016-12-28 NOTE — Telephone Encounter (Signed)
sw pt to confirm lab/MD appt in March per LOS

## 2016-12-31 NOTE — Telephone Encounter (Signed)
thx - cath findings reviewed. Appoint scheduled for hematology evaluation next month.

## 2017-01-01 ENCOUNTER — Telehealth: Payer: Self-pay | Admitting: Cardiovascular Disease

## 2017-01-01 NOTE — Telephone Encounter (Signed)
New Message    Lauren please call about the platelets

## 2017-01-02 NOTE — Telephone Encounter (Signed)
Follow Up   Per pt still hadn't heard anything from nurse regarding his platelets. Requesting a call back

## 2017-01-02 NOTE — Telephone Encounter (Signed)
I spoke with the pt and advised him that Cardiology has referred him to hematology for evaluation of thrombocytopenia (Dr Alen Blew).  The pt is scheduled for follow-up and labs with Dr Alen Blew in March.  The pt questioned what is being done prior to his appointment in March with Dr Alen Blew and I advised him that he is not on any anti-platelet drugs at this time that could be stopped.  I further advised him that Dr Alen Blew had reviewed referral and he would have advised if something needed to be done prior to appointment. The pt was not satisfied with this answer.  I instructed him that he can reach out to Dr Hazeline Junker office to further discuss his questions. Pt agreed with plan.

## 2017-01-03 ENCOUNTER — Telehealth: Payer: Self-pay | Admitting: *Deleted

## 2017-01-03 ENCOUNTER — Telehealth: Payer: Self-pay

## 2017-01-03 ENCOUNTER — Telehealth: Payer: Self-pay | Admitting: Oncology

## 2017-01-03 NOTE — Telephone Encounter (Signed)
Patient left a message stating,"I had a heart cath and Dr. [name] noticed my platelets were low (62). My appointment with Dr. Alen Blew isn't til March 22nd, and that is not acceptable. I need an earlier visit. Return number is [phone].

## 2017-01-03 NOTE — Telephone Encounter (Signed)
Confirmed appt 2/22 at 245 pm per LOS

## 2017-01-03 NOTE — Telephone Encounter (Signed)
Patient informed and verbalized understanding

## 2017-01-03 NOTE — Telephone Encounter (Signed)
Please let him know that it was an error. He was suppose to be scheduled 2/22 NOT 3/22. Another scheduling message will be sent.

## 2017-01-03 NOTE — Telephone Encounter (Signed)
That is correct 

## 2017-01-03 NOTE — Telephone Encounter (Signed)
Pt is unable to recall the psychologist you wanted him to see. He states it may be [employer]? Please verify and call pt back.  CB # [id number].

## 2017-01-04 ENCOUNTER — Ambulatory Visit (HOSPITAL_BASED_OUTPATIENT_CLINIC_OR_DEPARTMENT_OTHER): Payer: Medicare Other | Admitting: Oncology

## 2017-01-04 ENCOUNTER — Other Ambulatory Visit (HOSPITAL_BASED_OUTPATIENT_CLINIC_OR_DEPARTMENT_OTHER): Payer: Medicare Other

## 2017-01-04 VITALS — BP 123/66 | HR 70 | Temp 97.6°F | Resp 18 | Ht 72.0 in | Wt 157.3 lb

## 2017-01-04 DIAGNOSIS — Z85820 Personal history of malignant melanoma of skin: Secondary | ICD-10-CM

## 2017-01-04 DIAGNOSIS — D696 Thrombocytopenia, unspecified: Secondary | ICD-10-CM | POA: Diagnosis not present

## 2017-01-04 LAB — CBC WITH DIFFERENTIAL/PLATELET
BASO%: 0.6 % (ref 0.0–2.0)
BASOS ABS: 0 10*3/uL (ref 0.0–0.1)
EOS%: 4 % (ref 0.0–7.0)
Eosinophils Absolute: 0.2 10*3/uL (ref 0.0–0.5)
HCT: 37.1 % — ABNORMAL LOW (ref 38.4–49.9)
HEMOGLOBIN: 13 g/dL (ref 13.0–17.1)
LYMPH#: 1.6 10*3/uL (ref 0.9–3.3)
LYMPH%: 31.7 % (ref 14.0–49.0)
MCH: 32.3 pg (ref 27.2–33.4)
MCHC: 35 g/dL (ref 32.0–36.0)
MCV: 92.3 fL (ref 79.3–98.0)
MONO#: 0.8 10*3/uL (ref 0.1–0.9)
MONO%: 15.7 % — AB (ref 0.0–14.0)
NEUT%: 48 % (ref 39.0–75.0)
NEUTROS ABS: 2.4 10*3/uL (ref 1.5–6.5)
NRBC: 0 % (ref 0–0)
Platelets: 63 10*3/uL — ABNORMAL LOW (ref 140–400)
RBC: 4.02 10*6/uL — AB (ref 4.20–5.82)
RDW: 12.5 % (ref 11.0–14.6)
WBC: 5 10*3/uL (ref 4.0–10.3)

## 2017-01-04 LAB — CHCC SMEAR

## 2017-01-04 NOTE — Progress Notes (Signed)
Hematology and Oncology Follow Up Visit  Corey Oneill [id number] 1936-04-20 81 y.o. 01/04/2017 4:28 PM [name], MDLalonde, [name], MD   Principle Diagnosis: 81 year old gentleman with the following issues:  1. 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 This was diagnosed in December 2017 and he underwent wide excision completed on 10/24/2016. The final pathology revealed a 0.39 mm Clark's level was IV superficial spreading melanoma. No ulceration was noted with very low mitotic index. The final pathological staging was T1 pain. No lymph node sampling was done.  2. Thrombocytopenia: Diagnosed in February 2018. Workup is ongoing. Differential diagnosis includes immune thrombocytopenia versus myelodysplasia  Prior Therapy: He is status post wide excision of his melanoma in December 2017.  Current therapy: Observation and surveillance.   Interim History:  Corey Oneill Resides presents today for a follow-up visit. He is a gentleman I saw in consultation for the evaluation of resected melanoma in December 2017. He has been on active surveillance at this time. He underwent a stress test in February 2018 and a catheterization was recommended by his cardiologist. Preoperative labs obtained showed a platelet count of 69 with a hemoglobin of 12.8 and normal is 5.0. A repeat CBC on 12/26/2010 showed a platelet count of 62,000. He underwent cardiac catheterization and no intervention was done. No postoperative bleeding on his radial site. He denied any epistaxis, hematochezia or melena. He denied any recent changes in his medication. He feels well without any recent complaints.   He does not report any headaches, blurry vision, syncope or seizures. He does not report any fevers, chills, sweats or weight loss. He does not report any chest pain, palpitation, orthopnea or leg edema. He does not report any cough, wheezing or hemoptysis. He does not report any nausea, vomiting or  abdominal pain. He does not report any frequency urgency or hesitancy. He does not report any skeletal complaints. Remaining review of systems unremarkable.   Medications: I have reviewed the patient's current medications.  Current Outpatient Prescriptions  Medication Sig Dispense Refill  . acetaminophen (TYLENOL) 500 MG tablet Take 500 mg by mouth every 8 (eight) hours as needed for mild pain. Reported on 11/26/2015    . aspirin EC 81 MG tablet Take 81 mg by mouth daily.    . carvedilol (COREG) 3.125 MG tablet Take 1 tablet (3.125 mg total) by mouth 2 (two) times daily. 60 tablet 3  . Cholecalciferol (VITAMIN D PO) Take 2,500 mcg by mouth daily.    . DULoxetine (CYMBALTA) 30 MG capsule Take 1 capsule by mouth daily.    . isosorbide mononitrate (IMDUR) 30 MG 24 hr tablet Take one-half tablet by mouth daily (Patient taking differently: Take 15 mg by mouth daily. Take one-half tablet by mouth daily) 45 tablet 3  . rosuvastatin (CRESTOR) 10 MG tablet TAKE 1 TABLET ONCE DAILY. 30 tablet 1  . nitroGLYCERIN (NITROSTAT) 0.4 MG SL tablet Place 1 tablet (0.4 mg total) under the tongue every 5 (five) minutes as needed for chest pain. (Patient not taking: Reported on 01/04/2017) 25 tablet 3   No current facility-administered medications for this visit.      Allergies: No Known Allergies  Past Medical History, Surgical history, Social history, and Family History were reviewed and updated.  Physical Exam: Blood pressure 123/66, pulse 70, temperature 97.6 F (36.4 C), temperature source Oral, resp. rate 18, height 6' (1.829 m), weight 157 lb 4.8 oz (71.4 kg), SpO2 99 %. ECOG: 0 General appearance: alert and cooperative  appeared without distress. Head: Normocephalic, without obvious abnormality Neck: no adenopathy Lymph nodes: Cervical, supraclavicular, and axillary nodes normal. Heart:regular rate and rhythm, S1, S2 normal, no murmur, click, rub or gallop Lung:chest clear, no wheezing, rales, normal  symmetric air entry Abdomin: soft, non-tender, without masses or organomegaly without shifting dullness or ascites. EXT:no erythema, induration, or nodules   Lab Results: Lab Results  Component Value Date   WBC 5.0 01/04/2017   HGB 13.0 01/04/2017   HCT 37.1 (L) 01/04/2017   MCV 92.3 01/04/2017   PLT 63 (L) 01/04/2017     Chemistry      Component Value Date/Time   NA 141 12/26/2016 0742   NA 141 12/25/2016 1419   K 4.8 12/26/2016 0742   CL 106 12/26/2016 0742   CO2 30 12/26/2016 0742   BUN 12 12/26/2016 0742   BUN 13 12/25/2016 1419   CREATININE 0.86 12/26/2016 0742   CREATININE 0.91 01/05/2016 1238      Component Value Date/Time   CALCIUM 9.0 12/26/2016 0742   ALKPHOS 76 02/20/2016 1559   AST 26 02/20/2016 1559   ALT 18 02/20/2016 1559   BILITOT 0.7 02/20/2016 1559     Peripheral smear was personally reviewed today and showed no platelet clumping. No evidence of dysplasia or red cell fragmentation noted.  Impression and Plan:  81 year old gentleman with the following issues:  1. 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 This was diagnosed in December 2017 and he underwent wide excision completed on 10/24/2016. The final pathology revealed a 0.39 mm Clark's level was IV superficial spreading melanoma. No ulceration was noted with very low mitotic index. The final pathological staging was T1 pain. No lymph node sampling was done.  He will continue your active surveillance regarding this matter without any evidence to suggest recurrent disease.  2. Thrombocytopenia: Diagnosed in February 2018 with a platelet count currently around 62,000. He has a normal CBC otherwise with a normal differential and a normal peripheral smear.  The differential diagnosis was reviewed today with the patient which include immune thrombocytopenia, sequestration related to splenomegaly, medication-related, platelet clumping, and a bone marrow disorder such as  myelodysplastic syndrome.  From a management standpoint, he does not have any active bleeding and his platelet count is adequate at this time and not at risk of spontaneous bleeding. I have recommended. Of observation and repeat his CBC in 1 month. If his platelets continues to drop, a trial of steroids as a bone marrow biopsy will be needed to evaluate him possible MDS. 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  In the meantime, he is to report any signs or symptoms of bleeding immediately in the interim. I see no issues with continuing aspirin at this time at the current dose.  All his questions were answered today to his satisfaction.  Zola Button, MD 2/22/20184:28 PM

## 2017-01-26 ENCOUNTER — Other Ambulatory Visit: Payer: Self-pay | Admitting: Family Medicine

## 2017-01-29 DIAGNOSIS — F321 Major depressive disorder, single episode, moderate: Secondary | ICD-10-CM | POA: Diagnosis not present

## 2017-02-01 ENCOUNTER — Ambulatory Visit (HOSPITAL_BASED_OUTPATIENT_CLINIC_OR_DEPARTMENT_OTHER): Payer: Medicare Other | Admitting: Oncology

## 2017-02-01 ENCOUNTER — Encounter (INDEPENDENT_AMBULATORY_CARE_PROVIDER_SITE_OTHER): Payer: Self-pay

## 2017-02-01 ENCOUNTER — Telehealth: Payer: Self-pay | Admitting: Oncology

## 2017-02-01 ENCOUNTER — Other Ambulatory Visit (HOSPITAL_BASED_OUTPATIENT_CLINIC_OR_DEPARTMENT_OTHER): Payer: Medicare Other

## 2017-02-01 VITALS — BP 124/63 | HR 68 | Resp 18 | Ht 72.0 in | Wt 157.1 lb

## 2017-02-01 DIAGNOSIS — D696 Thrombocytopenia, unspecified: Secondary | ICD-10-CM

## 2017-02-01 DIAGNOSIS — Z85820 Personal history of malignant melanoma of skin: Secondary | ICD-10-CM

## 2017-02-01 DIAGNOSIS — D693 Immune thrombocytopenic purpura: Secondary | ICD-10-CM

## 2017-02-01 LAB — CBC WITH DIFFERENTIAL/PLATELET
BASO%: 0.8 % (ref 0.0–2.0)
Basophils Absolute: 0 10*3/uL (ref 0.0–0.1)
EOS ABS: 0.2 10*3/uL (ref 0.0–0.5)
EOS%: 4.1 % (ref 0.0–7.0)
HEMATOCRIT: 37.5 % — AB (ref 38.4–49.9)
HEMOGLOBIN: 12.7 g/dL — AB (ref 13.0–17.1)
LYMPH#: 1.3 10*3/uL (ref 0.9–3.3)
LYMPH%: 28 % (ref 14.0–49.0)
MCH: 31.9 pg (ref 27.2–33.4)
MCHC: 34 g/dL (ref 32.0–36.0)
MCV: 93.8 fL (ref 79.3–98.0)
MONO#: 0.6 10*3/uL (ref 0.1–0.9)
MONO%: 12.1 % (ref 0.0–14.0)
NEUT%: 55 % (ref 39.0–75.0)
NEUTROS ABS: 2.6 10*3/uL (ref 1.5–6.5)
PLATELETS: 54 10*3/uL — AB (ref 140–400)
RBC: 4 10*6/uL — ABNORMAL LOW (ref 4.20–5.82)
RDW: 12.6 % (ref 11.0–14.6)
WBC: 4.8 10*3/uL (ref 4.0–10.3)

## 2017-02-01 MED ORDER — PREDNISONE 20 MG PO TABS: ORAL_TABLET | ORAL | 1 refills | Status: DC

## 2017-02-01 NOTE — Telephone Encounter (Signed)
Gave patient AVS and calender per 3/22 los 

## 2017-02-01 NOTE — Progress Notes (Signed)
Hematology and Oncology Follow Up Visit  Corey Oneill [id number] [birth year] 81 y.o. 02/01/2017 3:32 PM [name], MDLalonde, [name], MD   Principle Diagnosis: 81 year old gentleman with the following issues:  1. 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 This was diagnosed in December 2017 and he underwent wide excision completed on 10/24/2016. The final pathology revealed a 0.39 mm Clark's level was IV superficial spreading melanoma. No ulceration was noted with very low mitotic index. The final pathological staging was T1 pain. No lymph node sampling was done.  2. Thrombocytopenia: Diagnosed in February 2018. Workup is ongoing. Differential diagnosis includes immune thrombocytopenia versus myelodysplasia  Prior Therapy: He is status post wide excision of his melanoma in December 2017.  Current therapy: Observation and surveillance.   Interim History:  Corey Oneill presents today for a follow-up visit. Since the last visit, he reports feeling reasonably well without any major complaints. He denied any epistaxis, hematochezia or melena. He denied any recent changes in his medication. He remains reasonably active attending to her activities of daily living. He does not report any recent hospitalization or illnesses. He continues to participate in cardiac rehabilitation. He does not report any hospitalizations or illnesses.   He does not report any headaches, blurry vision, syncope or seizures. He does not report any fevers, chills, sweats or weight loss. He does not report any chest pain, palpitation, orthopnea or leg edema. He does not report any cough, wheezing or hemoptysis. He does not report any nausea, vomiting or abdominal pain. He does not report any frequency urgency or hesitancy. He does not report any skeletal complaints. Remaining review of systems unremarkable.   Medications: I have reviewed the patient's current medications.  Current Outpatient  Prescriptions  Medication Sig Dispense Refill  . acetaminophen (TYLENOL) 500 MG tablet Take 500 mg by mouth every 8 (eight) hours as needed for mild pain. Reported on 11/26/2015    . aspirin EC 81 MG tablet Take 81 mg by mouth daily.    . carvedilol (COREG) 3.125 MG tablet Take 1 tablet (3.125 mg total) by mouth 2 (two) times daily. 60 tablet 3  . Cholecalciferol (VITAMIN D PO) Take 2,500 mcg by mouth daily.    . DULoxetine (CYMBALTA) 30 MG capsule Take 1 capsule by mouth daily.    . isosorbide mononitrate (IMDUR) 30 MG 24 hr tablet Take one-half tablet by mouth daily (Patient taking differently: Take 15 mg by mouth daily. Take one-half tablet by mouth daily) 45 tablet 3  . rosuvastatin (CRESTOR) 10 MG tablet TAKE 1 TABLET ONCE DAILY. 30 tablet 0  . nitroGLYCERIN (NITROSTAT) 0.4 MG SL tablet Place 1 tablet (0.4 mg total) under the tongue every 5 (five) minutes as needed for chest pain. (Patient not taking: Reported on 01/04/2017) 25 tablet 3  . predniSONE (DELTASONE) 20 MG tablet Take three tablets daily. 90 tablet 1   No current facility-administered medications for this visit.      Allergies: No Known Allergies  Past Medical History, Surgical history, Social history, and Family History were reviewed and updated.  Physical Exam: Blood pressure 124/63, pulse 68, resp. rate 18, height 6' (1.829 m), weight 157 lb 1.6 oz (71.3 kg). ECOG: 0 General appearance: Well-appearing gentleman without distress. Head: Normocephalic, without obvious abnormality no oral thrush or ulcers. Neck: no adenopathy Lymph nodes: Cervical, supraclavicular, and axillary nodes normal. Heart:regular rate and rhythm, S1, S2 normal, no murmur, click, rub or gallop Lung:chest clear, no wheezing, rales, normal symmetric air  entry Abdomin: soft, non-tender, without masses or organomegaly no rebound or guarding.  EXT:no erythema, induration, or nodules   Lab Results: Lab Results  Component Value Date   WBC 4.8  02/01/2017   HGB 12.7 (L) 02/01/2017   HCT 37.5 (L) 02/01/2017   MCV 93.8 02/01/2017   PLT 54 (L) 02/01/2017     Chemistry      Component Value Date/Time   NA 141 12/26/2016 0742   NA 141 12/25/2016 1419   K 4.8 12/26/2016 0742   CL 106 12/26/2016 0742   CO2 30 12/26/2016 0742   BUN 12 12/26/2016 0742   BUN 13 12/25/2016 1419   CREATININE 0.86 12/26/2016 0742   CREATININE 0.91 01/05/2016 1238      Component Value Date/Time   CALCIUM 9.0 12/26/2016 0742   ALKPHOS 76 02/20/2016 1559   AST 26 02/20/2016 1559   ALT 18 02/20/2016 1559   BILITOT 0.7 02/20/2016 1559     Peripheral smear was personally reviewed today and showed no platelet clumping. No evidence of dysplasia or red cell fragmentation noted.  Impression and Plan:  81 year old gentleman with the following issues:  1. 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 This was diagnosed in December 2017 and he underwent wide excision completed on 10/24/2016. The final pathology revealed a 0.39 mm Clark's level was IV superficial spreading melanoma. No ulceration was noted with very low mitotic index. The final pathological staging was T1 pain. No lymph node sampling was done.  He will continue your active surveillance regarding this matter without any evidence to suggest recurrent disease.  2. Thrombocytopenia: Diagnosed in February 2018 with a platelet count currently around 62,000. He has a normal CBC otherwise with a normal differential and a normal peripheral smear.  His thrombocytopenia continues to persist with a platelet count dropped to 54,000. The etiology is related due to bone marrow disorder, sequestration or destruction from ITP. Options to proceed for now was discussed today including continued observation and surveillance, bone marrow biopsy or empiric trial of steroids. He is open to trying steroids for the time being. I recommended starting a 60 mg daily dose and monitor his platelets  closely. A quick taper will be needed regardless of his response.  Complications associated with steroids were discussed today. These include hyperglycemia, edema, dyspepsia, insomnia, abdomen and other complications. After discussion today he is agreeable to try at this time.  He will follow-up in 2 weeks to recheck his platelet counts.  3. Follow-up: Lab in 2 weeks and M.D. visit set up for weeks.    Zola Button, MD 3/22/20183:32 PM

## 2017-02-05 DIAGNOSIS — F321 Major depressive disorder, single episode, moderate: Secondary | ICD-10-CM | POA: Diagnosis not present

## 2017-02-06 ENCOUNTER — Encounter: Payer: Self-pay | Admitting: *Deleted

## 2017-02-06 ENCOUNTER — Telehealth: Payer: Self-pay | Admitting: *Deleted

## 2017-02-06 NOTE — Telephone Encounter (Signed)
He can try benadryl or OTC melatonin for sleep. Prednisone is can cause these symptoms but it will be temporary.

## 2017-02-06 NOTE — Telephone Encounter (Signed)
Patient calling to c/o unable to sleep since starting prednisone 20 mg on 02/02/17. Is there anything else? also c/o both legs cold below the knee, wearing socks and using electric blanket, but he does sleep with the windows open.

## 2017-02-07 NOTE — Telephone Encounter (Signed)
Spoke with patient, states he did sleep better last night. Per dr Alen Blew, patient may take melatonin or bendaryl @ hs. Prednisone can cause all of these problems, but they are temporary. Patient verbalized understanding.

## 2017-02-08 ENCOUNTER — Encounter: Payer: Self-pay | Admitting: *Deleted

## 2017-02-08 ENCOUNTER — Telehealth: Payer: Self-pay | Admitting: *Deleted

## 2017-02-08 NOTE — Telephone Encounter (Signed)
Patient calling to say he is still severely depressed, even when walking the dog. He is better with people around. Is not suicidal, but does not care if he lives or dies. He doesn't want to feel like this, because he has children and grandchildren. Asking if it would help if the prednisone dose could be reduced?

## 2017-02-09 NOTE — Telephone Encounter (Signed)
Please tell him to decrease Prednisone to 40 mg (two tablets instead of three) and let us know if that helped.

## 2017-02-09 NOTE — Telephone Encounter (Signed)
As noted below by Dr. Alen Blew, I informed patient to decrease his prednisone to 40 mg (two tablets) daily. Instructed him to call [facility] next week and see if that helps him. Patient verbalized understanding.

## 2017-02-13 ENCOUNTER — Telehealth: Payer: Self-pay

## 2017-02-13 NOTE — Telephone Encounter (Signed)
Take care of this 

## 2017-02-13 NOTE — Telephone Encounter (Signed)
Pt needs script for Shingrix called to [facility]. [place] December

## 2017-02-14 NOTE — Telephone Encounter (Signed)
Called in shingrix

## 2017-02-15 ENCOUNTER — Other Ambulatory Visit (HOSPITAL_BASED_OUTPATIENT_CLINIC_OR_DEPARTMENT_OTHER): Payer: Medicare Other

## 2017-02-15 ENCOUNTER — Telehealth: Payer: Self-pay | Admitting: *Deleted

## 2017-02-15 DIAGNOSIS — D696 Thrombocytopenia, unspecified: Secondary | ICD-10-CM

## 2017-02-15 DIAGNOSIS — D693 Immune thrombocytopenic purpura: Secondary | ICD-10-CM

## 2017-02-15 LAB — CBC WITH DIFFERENTIAL/PLATELET
BASO%: 1.3 % (ref 0.0–2.0)
Basophils Absolute: 0.1 10*3/uL (ref 0.0–0.1)
EOS%: 1.2 % (ref 0.0–7.0)
Eosinophils Absolute: 0.1 10*3/uL (ref 0.0–0.5)
HCT: 40.7 % (ref 38.4–49.9)
HGB: 13.7 g/dL (ref 13.0–17.1)
LYMPH#: 2.3 10*3/uL (ref 0.9–3.3)
LYMPH%: 27.1 % (ref 14.0–49.0)
MCH: 31.8 pg (ref 27.2–33.4)
MCHC: 33.6 g/dL (ref 32.0–36.0)
MCV: 94.9 fL (ref 79.3–98.0)
MONO#: 1 10*3/uL — ABNORMAL HIGH (ref 0.1–0.9)
MONO%: 11.9 % (ref 0.0–14.0)
NEUT%: 58.5 % (ref 39.0–75.0)
NEUTROS ABS: 5.1 10*3/uL (ref 1.5–6.5)
Platelets: 102 10*3/uL — ABNORMAL LOW (ref 140–400)
RBC: 4.29 10*6/uL (ref 4.20–5.82)
RDW: 13.3 % (ref 11.0–14.6)
WBC: 8.7 10*3/uL (ref 4.0–10.3)

## 2017-02-15 NOTE — Telephone Encounter (Signed)
-----   Message from Wyatt Portela, MD sent at 02/15/2017  9:46 AM EDT ----- Please let him know his Platelets are up to a 100. He is to stay on Prednisone 40 mg for the time being.

## 2017-02-15 NOTE — Telephone Encounter (Signed)
As noted below by Dr. Alen Blew, I informed patient of his platelet count. Also, he is to continue taking prednisone 40 mg daily. Patient verbalized understanding.

## 2017-02-19 ENCOUNTER — Other Ambulatory Visit: Payer: Self-pay | Admitting: Dermatology

## 2017-02-19 ENCOUNTER — Telehealth: Payer: Self-pay | Admitting: Family Medicine

## 2017-02-19 DIAGNOSIS — Z85820 Personal history of malignant melanoma of skin: Secondary | ICD-10-CM | POA: Diagnosis not present

## 2017-02-19 DIAGNOSIS — D225 Melanocytic nevi of trunk: Secondary | ICD-10-CM | POA: Diagnosis not present

## 2017-02-19 DIAGNOSIS — L821 Other seborrheic keratosis: Secondary | ICD-10-CM | POA: Diagnosis not present

## 2017-02-19 DIAGNOSIS — D485 Neoplasm of uncertain behavior of skin: Secondary | ICD-10-CM | POA: Diagnosis not present

## 2017-02-19 DIAGNOSIS — L814 Other melanin hyperpigmentation: Secondary | ICD-10-CM | POA: Diagnosis not present

## 2017-02-19 DIAGNOSIS — D696 Thrombocytopenia, unspecified: Secondary | ICD-10-CM

## 2017-02-19 DIAGNOSIS — C44319 Basal cell carcinoma of skin of other parts of face: Secondary | ICD-10-CM | POA: Diagnosis not present

## 2017-02-19 DIAGNOSIS — F321 Major depressive disorder, single episode, moderate: Secondary | ICD-10-CM | POA: Diagnosis not present

## 2017-02-19 MED ORDER — ROSUVASTATIN CALCIUM 10 MG PO TABS: 10.0000 mg | ORAL_TABLET | Freq: Every day | ORAL | 1 refills | Status: DC

## 2017-02-19 MED ORDER — DULOXETINE HCL 30 MG PO CPEP: 30.0000 mg | ORAL_CAPSULE | Freq: Every day | ORAL | 5 refills | Status: DC

## 2017-02-19 NOTE — Telephone Encounter (Signed)
Pt came in and stated that he is almost out of rosuvastatin and duloxetine. Pt wants to know if he is to continue and if so he needs refills sent to [facility]. Pt also states the prednisone that he was place on by another doctor is causing him side effects. He has been in touch with Dr. Keene Breath to inform him but wanted you to be aware too. He has issues with not sleeping, increase food and depression. Depression seems to be severe side at times. Pt can be reached at [phone]. Pt will follow with Dr. Keene Breath next Monday. His current dose is 20 mg that he states two once a day.

## 2017-02-19 NOTE — Telephone Encounter (Signed)
Have him set up an appointment to see me.

## 2017-02-20 NOTE — Telephone Encounter (Signed)
Pt has appointment 02/26/17

## 2017-02-22 ENCOUNTER — Ambulatory Visit (HOSPITAL_COMMUNITY): Payer: Medicare Other | Attending: Internal Medicine

## 2017-02-22 ENCOUNTER — Other Ambulatory Visit: Payer: Self-pay

## 2017-02-22 ENCOUNTER — Ambulatory Visit: Payer: Medicare Other | Admitting: Cardiovascular Disease

## 2017-02-22 DIAGNOSIS — Z952 Presence of prosthetic heart valve: Secondary | ICD-10-CM

## 2017-02-22 DIAGNOSIS — I350 Nonrheumatic aortic (valve) stenosis: Secondary | ICD-10-CM | POA: Diagnosis present

## 2017-02-22 DIAGNOSIS — I5189 Other ill-defined heart diseases: Secondary | ICD-10-CM

## 2017-02-22 DIAGNOSIS — I359 Nonrheumatic aortic valve disorder, unspecified: Secondary | ICD-10-CM | POA: Insufficient documentation

## 2017-02-22 DIAGNOSIS — I348 Other nonrheumatic mitral valve disorders: Secondary | ICD-10-CM | POA: Diagnosis not present

## 2017-02-22 HISTORY — DX: Other ill-defined heart diseases: I51.89

## 2017-02-23 ENCOUNTER — Telehealth: Payer: Self-pay | Admitting: Cardiovascular Disease

## 2017-02-23 NOTE — Telephone Encounter (Signed)
Walk in pt form-Pt requesting Lauren call him placed in [employer].

## 2017-02-26 ENCOUNTER — Telehealth: Payer: Self-pay | Admitting: Oncology

## 2017-02-26 ENCOUNTER — Other Ambulatory Visit (HOSPITAL_BASED_OUTPATIENT_CLINIC_OR_DEPARTMENT_OTHER): Payer: Medicare Other

## 2017-02-26 ENCOUNTER — Ambulatory Visit (HOSPITAL_BASED_OUTPATIENT_CLINIC_OR_DEPARTMENT_OTHER): Payer: Medicare Other | Admitting: Oncology

## 2017-02-26 ENCOUNTER — Ambulatory Visit (INDEPENDENT_AMBULATORY_CARE_PROVIDER_SITE_OTHER): Payer: Medicare Other | Admitting: Family Medicine

## 2017-02-26 VITALS — BP 148/62 | HR 63 | Temp 97.6°F | Resp 18 | Ht 72.0 in | Wt 160.1 lb

## 2017-02-26 DIAGNOSIS — Z79899 Other long term (current) drug therapy: Secondary | ICD-10-CM | POA: Diagnosis not present

## 2017-02-26 DIAGNOSIS — Z862 Personal history of diseases of the blood and blood-forming organs and certain disorders involving the immune mechanism: Secondary | ICD-10-CM | POA: Insufficient documentation

## 2017-02-26 DIAGNOSIS — D696 Thrombocytopenia, unspecified: Secondary | ICD-10-CM

## 2017-02-26 DIAGNOSIS — C434 Malignant melanoma of scalp and neck: Secondary | ICD-10-CM | POA: Diagnosis not present

## 2017-02-26 DIAGNOSIS — D693 Immune thrombocytopenic purpura: Secondary | ICD-10-CM

## 2017-02-26 DIAGNOSIS — F341 Dysthymic disorder: Secondary | ICD-10-CM | POA: Diagnosis not present

## 2017-02-26 DIAGNOSIS — Z85820 Personal history of malignant melanoma of skin: Secondary | ICD-10-CM

## 2017-02-26 DIAGNOSIS — I25118 Atherosclerotic heart disease of native coronary artery with other forms of angina pectoris: Secondary | ICD-10-CM | POA: Diagnosis not present

## 2017-02-26 LAB — CBC WITH DIFFERENTIAL/PLATELET
BASO%: 0.6 % (ref 0.0–2.0)
BASOS ABS: 0 10*3/uL (ref 0.0–0.1)
EOS%: 0.8 % (ref 0.0–7.0)
Eosinophils Absolute: 0.1 10*3/uL (ref 0.0–0.5)
HEMATOCRIT: 41 % (ref 38.4–49.9)
HGB: 13.7 g/dL (ref 13.0–17.1)
LYMPH#: 2 10*3/uL (ref 0.9–3.3)
LYMPH%: 25.5 % (ref 14.0–49.0)
MCH: 32.1 pg (ref 27.2–33.4)
MCHC: 33.5 g/dL (ref 32.0–36.0)
MCV: 95.8 fL (ref 79.3–98.0)
MONO#: 0.7 10*3/uL (ref 0.1–0.9)
MONO%: 9 % (ref 0.0–14.0)
NEUT#: 4.9 10*3/uL (ref 1.5–6.5)
NEUT%: 64.1 % (ref 39.0–75.0)
Platelets: 69 10*3/uL — ABNORMAL LOW (ref 140–400)
RBC: 4.28 10*6/uL (ref 4.20–5.82)
RDW: 13.9 % (ref 11.0–14.6)
WBC: 7.7 10*3/uL (ref 4.0–10.3)

## 2017-02-26 MED ORDER — PREDNISONE 10 MG PO TABS: ORAL_TABLET | ORAL | 0 refills | Status: DC

## 2017-02-26 NOTE — Progress Notes (Signed)
   Subjective:    Patient ID: Corey Oneill, male    DOB: Feb 23, 1936, 81 y.o.   MRN: [id number]  HPI He is here for a follow-up visit. He is currently being followed by Dr. Alen Blew and was seen by him today for treatment of thrombocytopenia. He was placed on steroids however the steroids caused psychological difficulties with him with depression, mood swings and suicidal ideation. Presently he is not having that and actually states that he is feeling much better. He is also getting along much better with his wife. He is in the process of the weekly taper of his prednisone. 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   Review of Systems     Objective:   Physical Exam Alert and in no distress with appropriate affect and smiling. Note from hematology was reviewed.      Assessment & Plan:  Dysthymia  Malignant melanoma of neck ([place])  Thrombocytopenia ([place])  Encounter for long-term (current) use of high-risk medication I went over the dosing regimen of his prednisone with him. He does have a good handle on this. I will continue him on the Cymbalta. He will continue to be followed by dermatology for the melanoma and BCE. Follow-up here as needed.

## 2017-02-26 NOTE — Telephone Encounter (Signed)
Appointments scheduled per 4.16.18 LOS. Patient given AVS report and calendars with future scheduled appointments. °

## 2017-02-26 NOTE — Progress Notes (Signed)
Hematology and Oncology Follow Up Visit  KYLIE GROS [id number] September 13, 1936 81 y.o. 02/26/2017 8:58 AM [name], MDLalonde, [name], MD   Principle Diagnosis: 81 year old gentleman with the following issues:  1. 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 This was diagnosed in December 2017 and he underwent wide excision completed on 10/24/2016. The final pathology revealed a 0.39 mm Clark's level was IV superficial spreading melanoma. No ulceration was noted with very low mitotic index. The final pathological staging was T1 pain. No lymph node sampling was done.  2. 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  Current therapy: Prednisone therapy currently at 40 mg daily  Interim History:  Mr. Viney presents today for a follow-up visit. Since the last visit, he started on prednisone 60 mg daily and tolerated it poorly. He had issues with depression, insomnia and emotional issues. He did have some increase in his appetite and mild dyspepsia. No lower extremity edema. His platelet count did respond after two weeks of therapy to 102. His dose was reduced to 40 mg which she tolerated mildly better but continues to have some issues. He denied any hematochezia, melena or any bleeding or thrombosis episodes. He did have a skin lesion removed from his forehead that is basal cell carcinoma.    He does not report any headaches, blurry vision, syncope or seizures. He does not report any fevers, chills, sweats or weight loss. He does not report any chest pain, palpitation, orthopnea or leg edema. He does not report any cough, wheezing or hemoptysis. He does not report any nausea, vomiting or abdominal pain. He does not report any frequency urgency or hesitancy. He does not report any skeletal complaints. Remaining review of systems unremarkable.   Medications: I have reviewed  the patient's current medications.  Current Outpatient Prescriptions  Medication Sig Dispense Refill  . acetaminophen (TYLENOL) 500 MG tablet Take 500 mg by mouth every 8 (eight) hours as needed for mild pain. Reported on 11/26/2015    . aspirin EC 81 MG tablet Take 81 mg by mouth daily.    . carvedilol (COREG) 3.125 MG tablet Take 1 tablet (3.125 mg total) by mouth 2 (two) times daily. 60 tablet 3  . Cholecalciferol (VITAMIN D PO) Take 2,500 mcg by mouth daily.    . DULoxetine (CYMBALTA) 30 MG capsule Take 1 capsule (30 mg total) by mouth daily. 30 capsule 5  . isosorbide mononitrate (IMDUR) 30 MG 24 hr tablet Take one-half tablet by mouth daily (Patient taking differently: Take 15 mg by mouth daily. Take one-half tablet by mouth daily) 45 tablet 3  . nitroGLYCERIN (NITROSTAT) 0.4 MG SL tablet Place 1 tablet (0.4 mg total) under the tongue every 5 (five) minutes as needed for chest pain. (Patient not taking: Reported on 01/04/2017) 25 tablet 3  . predniSONE (DELTASONE) 10 MG tablet Take 30 mg for a week.  Take 20 mg for a week Take 10 mg for a week.  Then stop. 20 tablet 0  . predniSONE (DELTASONE) 20 MG tablet Take three tablets daily. 90 tablet 1  . rosuvastatin (CRESTOR) 10 MG tablet Take 1 tablet (10 mg total) by mouth daily. 90 tablet 1   No current facility-administered medications for this visit.      Allergies: No Known Allergies  Past Medical History, Surgical history, Social history, and Family History were reviewed and updated.  Physical Exam:  Blood pressure (!) 148/62, pulse 63, temperature 97.6 F (36.4 C), temperature source Oral, resp. rate 18, height 6' (1.829 m), weight 160 lb 1.6 oz (72.6 kg), SpO2 100 %. ECOG: 0 General appearance: Alert, awake gentleman without distress. Head: Normocephalic, without obvious abnormality no oral ulcers or thrush. Neck: no adenopathy Lymph nodes: Cervical, supraclavicular, and axillary nodes normal. Heart:regular rate and rhythm, S1,  S2 normal, no murmur, click, rub or gallop Lung:chest clear, no wheezing, rales, normal symmetric air entry Abdomin: soft, non-tender, without masses or organomegaly no rebound or guarding.  EXT:no erythema, induration, or nodules   Lab Results: Lab Results  Component Value Date   WBC 7.7 02/26/2017   HGB 13.7 02/26/2017   HCT 41.0 02/26/2017   MCV 95.8 02/26/2017   PLT 69 (L) 02/26/2017     Chemistry      Component Value Date/Time   NA 141 12/26/2016 0742   NA 141 12/25/2016 1419   K 4.8 12/26/2016 0742   CL 106 12/26/2016 0742   CO2 30 12/26/2016 0742   BUN 12 12/26/2016 0742   BUN 13 12/25/2016 1419   CREATININE 0.86 12/26/2016 0742   CREATININE 0.91 01/05/2016 1238      Component Value Date/Time   CALCIUM 9.0 12/26/2016 0742   ALKPHOS 76 02/20/2016 1559   AST 26 02/20/2016 1559   ALT 18 02/20/2016 1559   BILITOT 0.7 02/20/2016 1559       Impression and Plan:  81 year old gentleman with the following issues:  1. 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 This was diagnosed in December 2017 and he underwent wide excision completed on 10/24/2016. The final pathology revealed a 0.39 mm Clark's level was IV superficial spreading melanoma. No ulceration was noted with very low mitotic index. The final pathological staging was T1 pain. No lymph node sampling was done.  He remains on active surveillance.  2. Thrombocytopenia: Diagnosed in February 2018 with a platelet count around 62,000. He has a normal CBC otherwise with a normal differential and a normal peripheral smear. These findings suggest ITP.  His platelet count did respond to steroid therapy although he tolerated 60 mg dosing poorly. His platelet count on 02/15/2017 was up to 102. His platelet count today is close to 70,000. For the time being, I have asked him to taper prednisone by 10 mg a week diffuse off for better tolerance. As long as his platelet counts remains above 50,000, no  further intervention will be needed. Other options to boost his platelet counts if needed to would include IVIG, rituximab and possibly splenectomy. I see no need for these interventions at this time if his prior counts remains above 50,000.  3. Follow-up: Laboratory 2 weeks and a follow-up in 6 weeks.   [facility] Albt Le, MD 4/16/20188:58 AM

## 2017-03-05 DIAGNOSIS — F321 Major depressive disorder, single episode, moderate: Secondary | ICD-10-CM | POA: Diagnosis not present

## 2017-03-13 ENCOUNTER — Telehealth: Payer: Self-pay | Admitting: *Deleted

## 2017-03-13 ENCOUNTER — Other Ambulatory Visit (HOSPITAL_BASED_OUTPATIENT_CLINIC_OR_DEPARTMENT_OTHER): Payer: Medicare Other

## 2017-03-13 DIAGNOSIS — D696 Thrombocytopenia, unspecified: Secondary | ICD-10-CM

## 2017-03-13 DIAGNOSIS — D693 Immune thrombocytopenic purpura: Secondary | ICD-10-CM

## 2017-03-13 LAB — CBC WITH DIFFERENTIAL/PLATELET
BASO%: 1 % (ref 0.0–2.0)
Basophils Absolute: 0.1 10*3/uL (ref 0.0–0.1)
EOS ABS: 0.1 10*3/uL (ref 0.0–0.5)
EOS%: 1.7 % (ref 0.0–7.0)
HEMATOCRIT: 41.6 % (ref 38.4–49.9)
HEMOGLOBIN: 14.2 g/dL (ref 13.0–17.1)
LYMPH#: 1.2 10*3/uL (ref 0.9–3.3)
LYMPH%: 19.8 % (ref 14.0–49.0)
MCH: 32.5 pg (ref 27.2–33.4)
MCHC: 34.1 g/dL (ref 32.0–36.0)
MCV: 95.3 fL (ref 79.3–98.0)
MONO#: 0.9 10*3/uL (ref 0.1–0.9)
MONO%: 15 % — ABNORMAL HIGH (ref 0.0–14.0)
NEUT%: 62.5 % (ref 39.0–75.0)
NEUTROS ABS: 3.9 10*3/uL (ref 1.5–6.5)
PLATELETS: 71 10*3/uL — AB (ref 140–400)
RBC: 4.36 10*6/uL (ref 4.20–5.82)
RDW: 13.7 % (ref 11.0–14.6)
WBC: 6.2 10*3/uL (ref 4.0–10.3)

## 2017-03-13 NOTE — Telephone Encounter (Signed)
As noted below by Dr. Alen Blew, I informed patient of his platelet count. Patient verbalized understanding and knows to continue the prednisone taper.

## 2017-03-13 NOTE — Telephone Encounter (Signed)
-----   Message from Wyatt Portela, MD sent at 03/13/2017 10:40 AM EDT ----- Please let him know his platelet count is 71. Keep the prednisone taper as we have discussed.

## 2017-03-14 ENCOUNTER — Other Ambulatory Visit: Payer: Medicare Other

## 2017-03-19 DIAGNOSIS — F321 Major depressive disorder, single episode, moderate: Secondary | ICD-10-CM | POA: Diagnosis not present

## 2017-03-19 IMAGING — CR DG CHEST 2V
2 series · 2 of 2 positions shown · non-contrast
Comparison: 12/22/2015

CLINICAL DATA: Preop

EXAM:
CHEST  2 VIEW

[w chest pa]
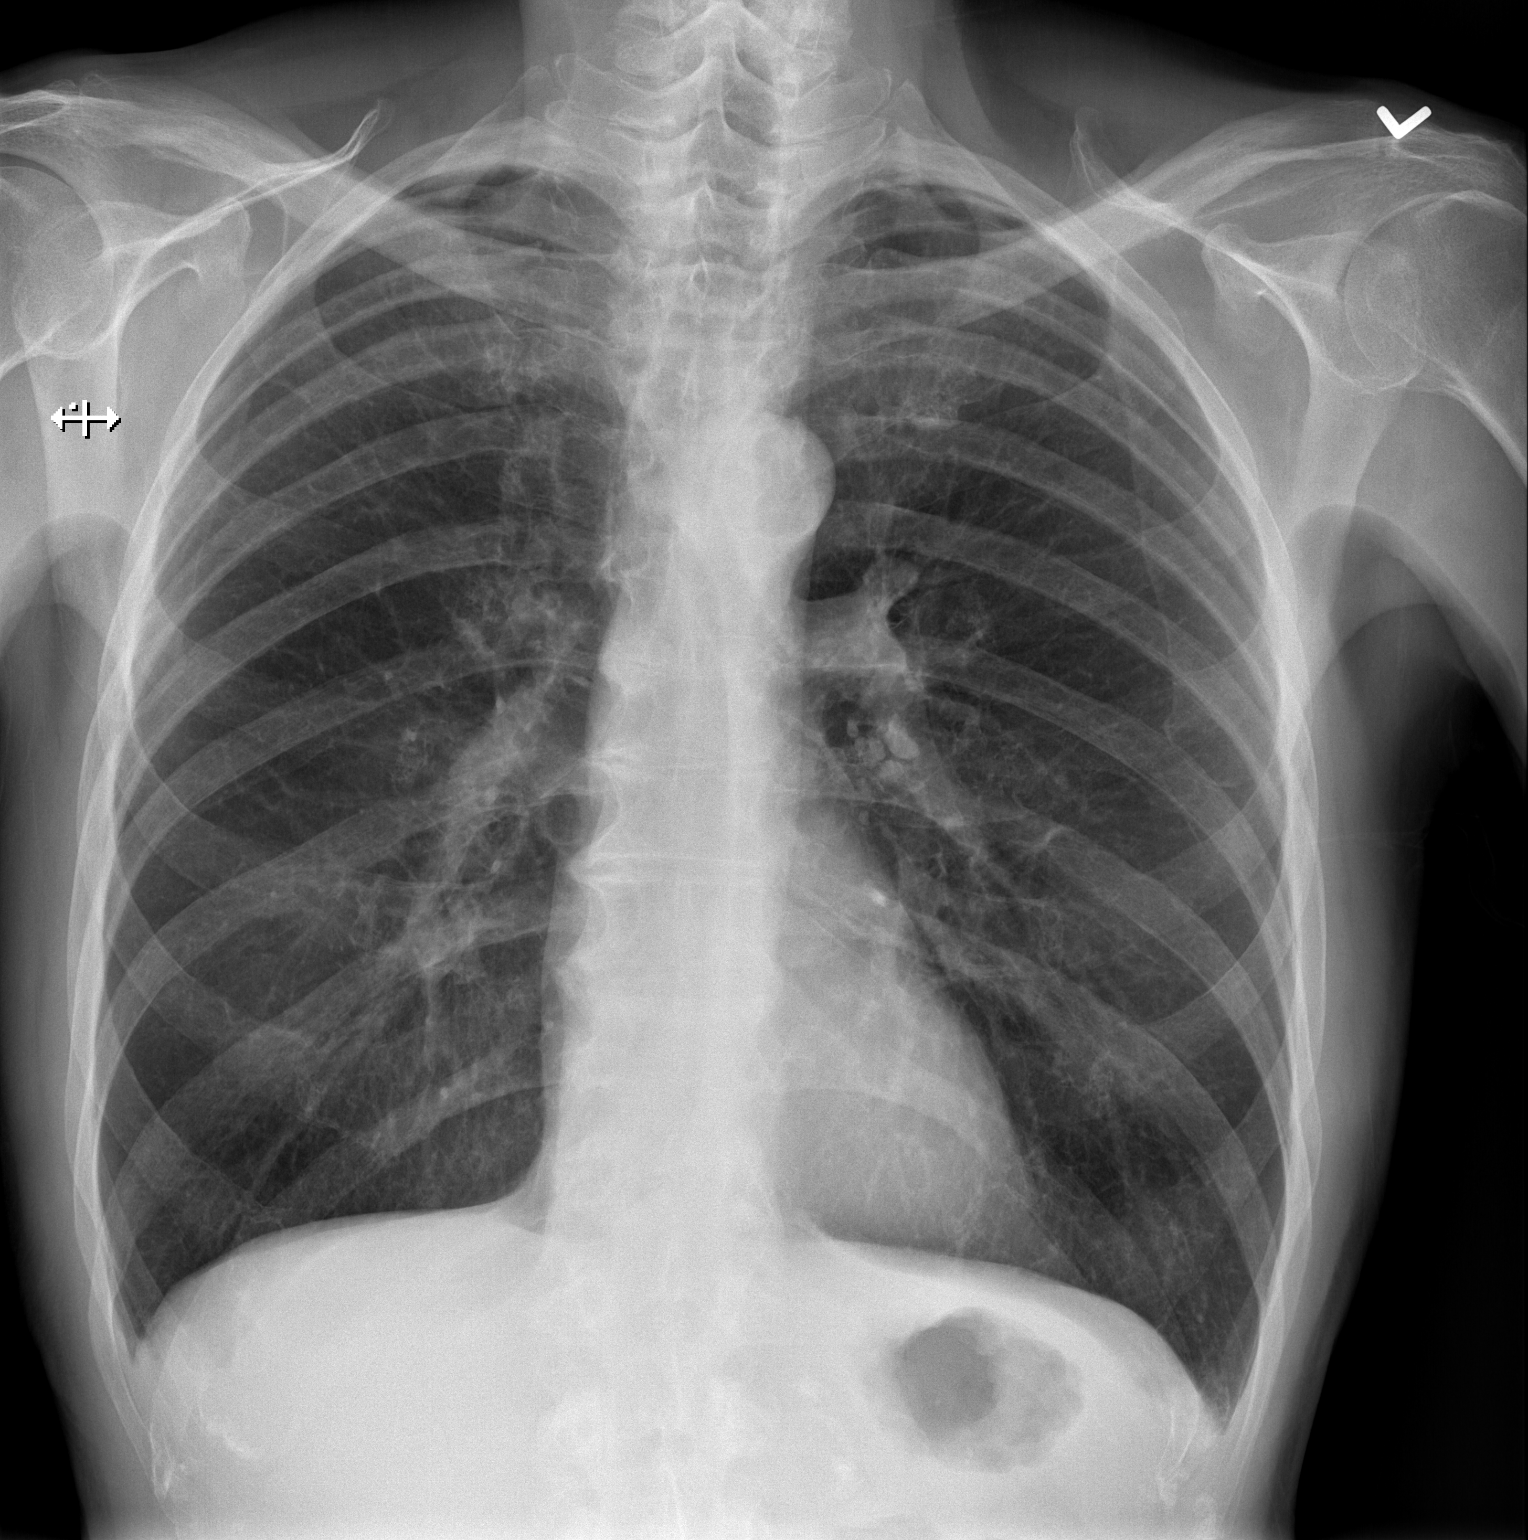

[w chest lat]
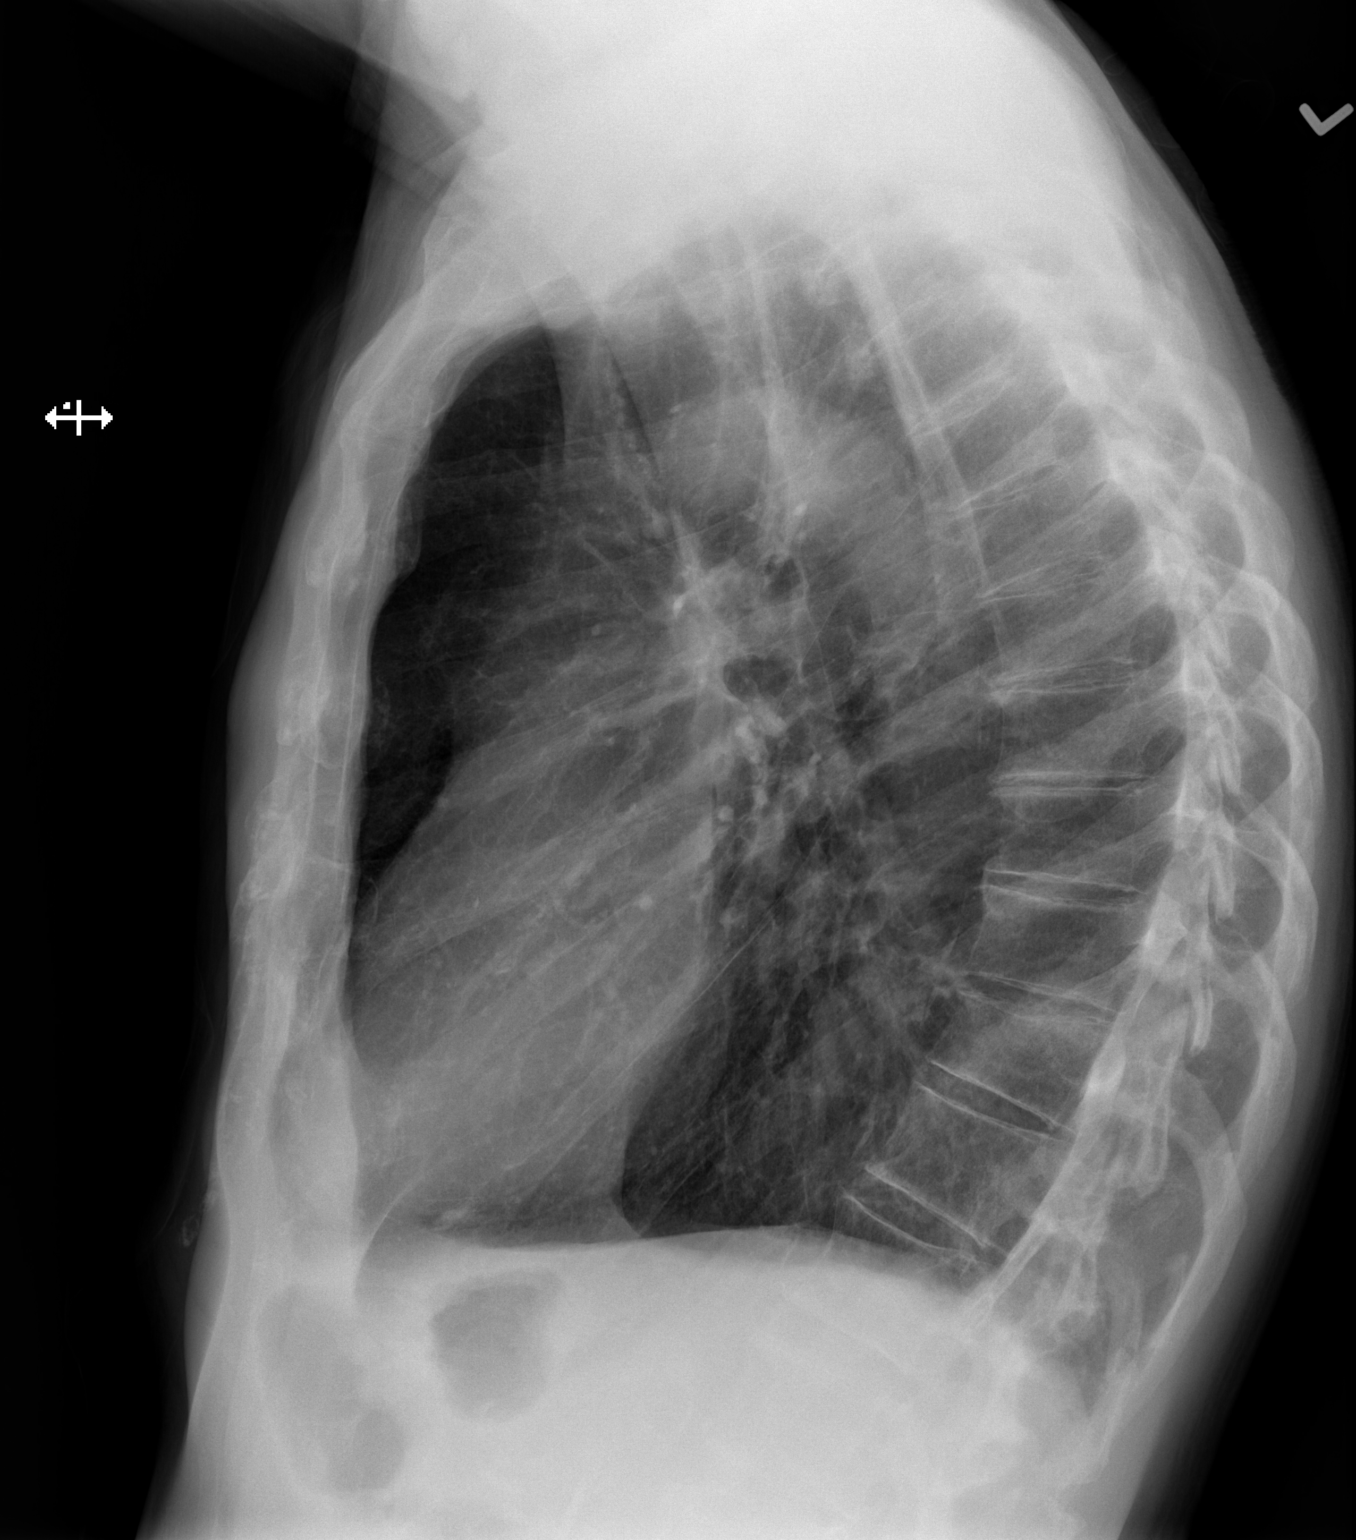

[2 of 2 positions shown; findings below may reference images not displayed]

FINDINGS: Normal heart size. Lungs are hyper aerated and clear. No
pneumothorax or pleural effusion.
IMPRESSION: No active cardiopulmonary disease.

## 2017-03-22 ENCOUNTER — Encounter: Payer: Self-pay | Admitting: Family Medicine

## 2017-03-22 ENCOUNTER — Ambulatory Visit (INDEPENDENT_AMBULATORY_CARE_PROVIDER_SITE_OTHER): Payer: Medicare Other | Admitting: Family Medicine

## 2017-03-22 VITALS — BP 110/60 | HR 80 | Wt 157.0 lb

## 2017-03-22 DIAGNOSIS — R5383 Other fatigue: Secondary | ICD-10-CM | POA: Diagnosis not present

## 2017-03-22 DIAGNOSIS — I25118 Atherosclerotic heart disease of native coronary artery with other forms of angina pectoris: Secondary | ICD-10-CM

## 2017-03-22 NOTE — Progress Notes (Signed)
Subjective:     Patient ID: Corey Oneill, male   DOB: [birth year], 81 y.o.   MRN: [id number]  HPI Corey Oneill is a 81 year old man with ITP who presents today for concerns regarding fatigue after stopping his prednisone 4 days ago. He has felt decreased energy as well as intermittent nausea for the past 3 days. He reports hypersomnolence yesterday napping several times throughout the day. He does report feeling a little better today, but still has some fatigue. He has no shortness of breath, chest pain, chills, congestion, itchy or watery eyes, sneezing, lightheadedness, dizziness, bruising, headaches, bone pain, or joint pain.  He has not had any depressive symptoms recently and feels this is well-controlled. He does have a runny nose, but this has been present for several months and is not new.  He was diagnosed with ITP in March and started on prednisone at that time (60mg ). He took this for several days, but had severe depressive symptoms and was told to take 40mg  instead. He took 40mg  for 2 weeks, 30mg  for 1 week, 10mg  for 1 week, and stopped the prednisone 4 days ago. He did feel better while taking the prednisone. He is being followed by Dr. Alen Blew for his ITP and has an appointment with him next week.  Review of Systems Pertinent positives and negatives included in the subjective above.    Objective:   Physical Exam Patient alert and in no distress. Heart was regular rate and rhythm without murmurs, rubs, or gallops. Lungs were clear to auscultation without wheezes, crackles, or rhonchi No edema was noted in the lower extremities.  No bruising was seen on extremities.     Assessment and Plan:     1. Fatigue: Patient presentation is not concerning for an infection at this point. His fatigue is most likely from recently stopping the steroids, but because his symptoms are better today we will have him continue to monitor his symptoms and follow up with Dr. Alen Blew regarding his ITP. We  informed him to come in if he doesn't continue to get better, his fatigue gets worse, or he develops any additional symptoms.  Patient was seen in conjunction with Dr. Redmond School. [place] Calix, MS3

## 2017-03-24 IMAGING — CR DG CHEST 1V PORT
2 series · 2 of 2 positions shown · non-contrast
Comparison: 02/15/2016

CLINICAL DATA: Post transcatheter aortic valve replacement

EXAM:
PORTABLE CHEST 1 VIEW

[AP (1 of 2)]
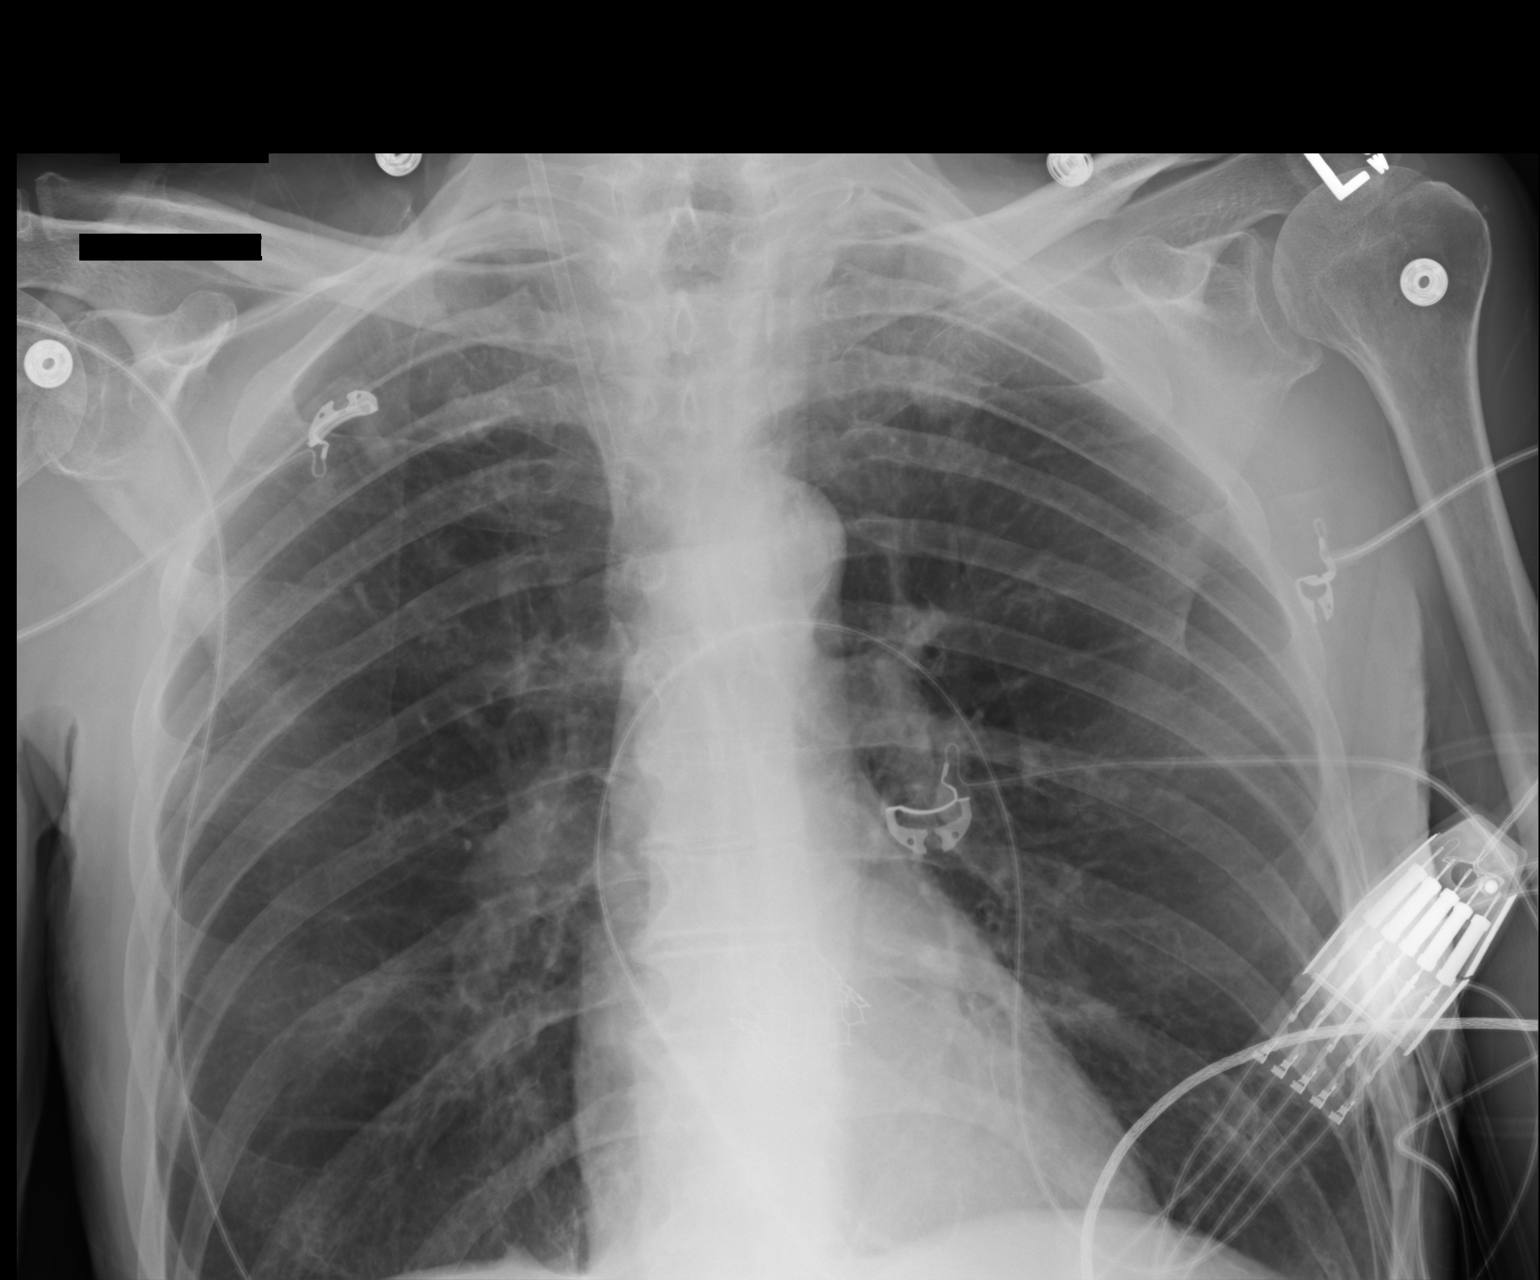

[AP (2 of 2)]
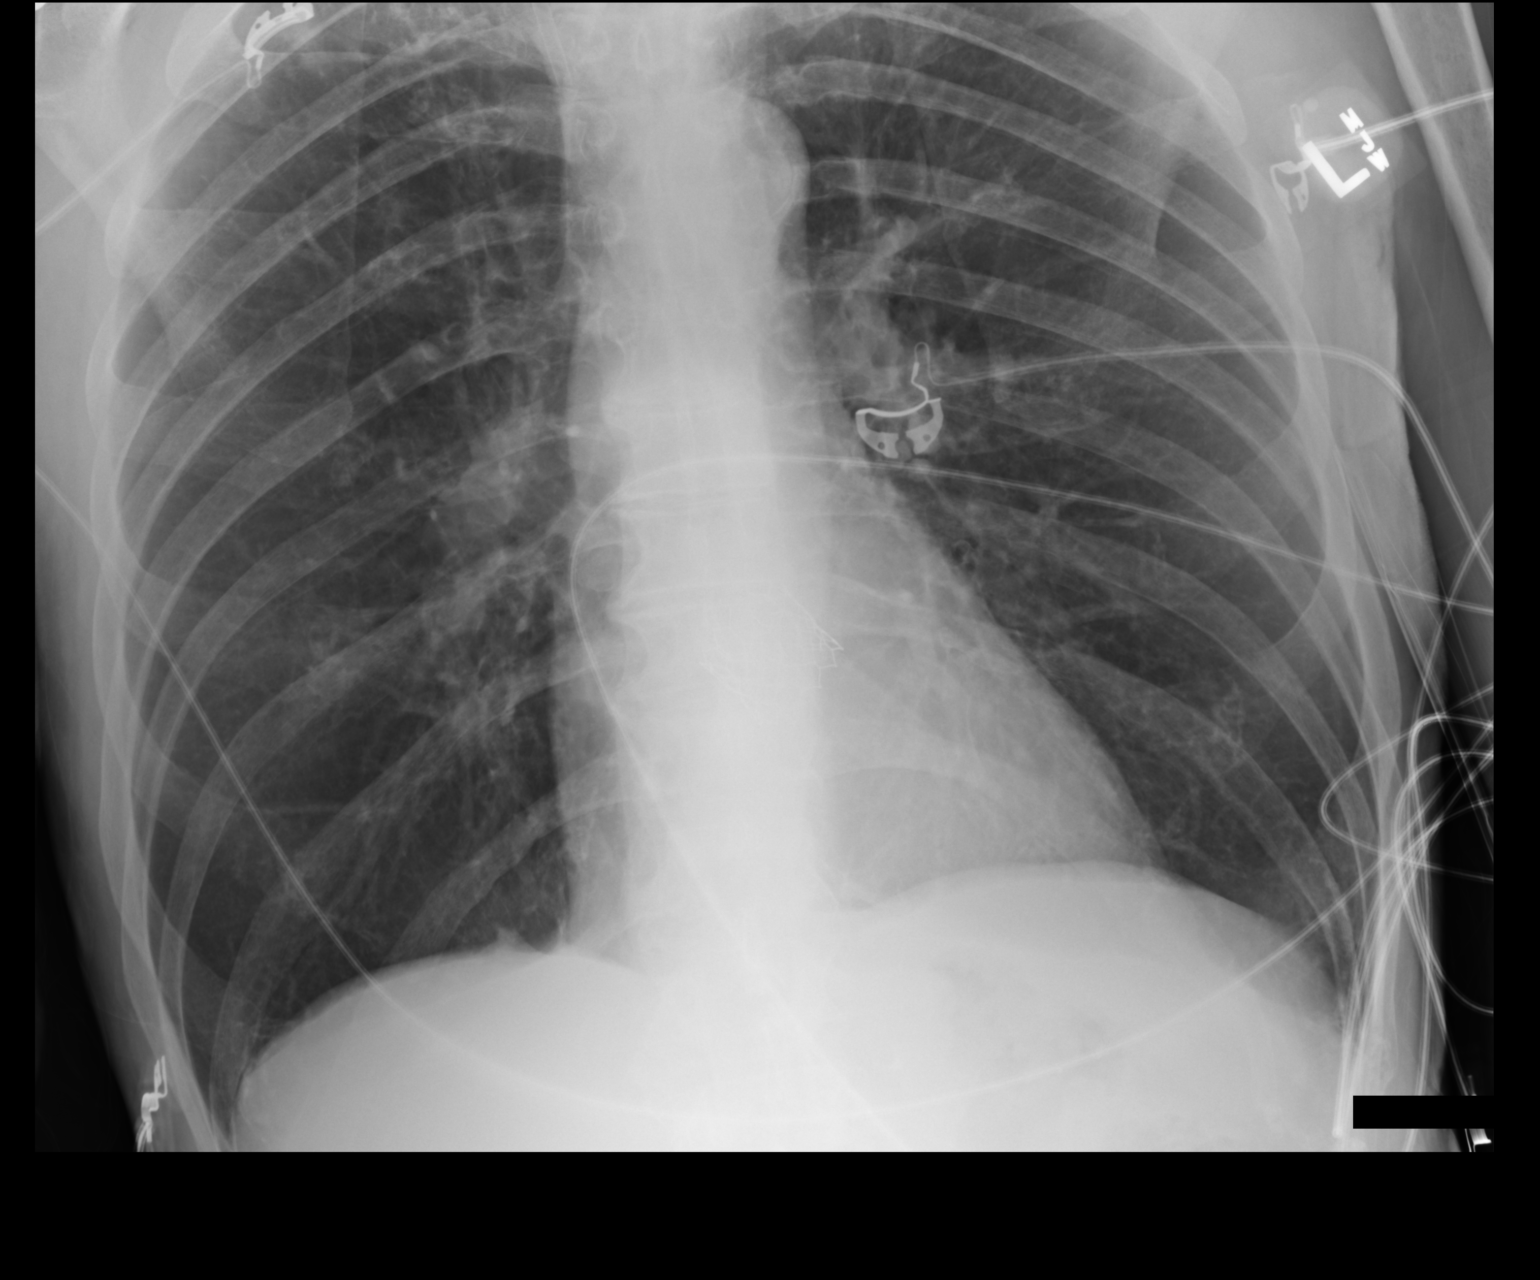

[2 of 2 positions shown; findings below may reference images not displayed]

FINDINGS: Cardiomediastinal silhouette is stable. Again noted status post
aortic valve replacement. Right IJ sheath is unchanged in position.
No infiltrate or pleural effusion. No pulmonary edema.
IMPRESSION: Status post aortic valve replacement. Right IJ sheath is unchanged
in position. No acute infiltrate or pulmonary edema. No
pneumothorax.

## 2017-03-28 ENCOUNTER — Other Ambulatory Visit (HOSPITAL_BASED_OUTPATIENT_CLINIC_OR_DEPARTMENT_OTHER): Payer: Medicare Other

## 2017-03-28 ENCOUNTER — Telehealth: Payer: Self-pay | Admitting: *Deleted

## 2017-03-28 DIAGNOSIS — D693 Immune thrombocytopenic purpura: Secondary | ICD-10-CM

## 2017-03-28 DIAGNOSIS — D696 Thrombocytopenia, unspecified: Secondary | ICD-10-CM | POA: Diagnosis present

## 2017-03-28 LAB — CBC WITH DIFFERENTIAL/PLATELET
BASO%: 0.6 % (ref 0.0–2.0)
BASOS ABS: 0 10*3/uL (ref 0.0–0.1)
EOS%: 2.7 % (ref 0.0–7.0)
Eosinophils Absolute: 0.1 10*3/uL (ref 0.0–0.5)
HCT: 35.6 % — ABNORMAL LOW (ref 38.4–49.9)
HGB: 12.2 g/dL — ABNORMAL LOW (ref 13.0–17.1)
LYMPH%: 17.6 % (ref 14.0–49.0)
MCH: 32 pg (ref 27.2–33.4)
MCHC: 34.3 g/dL (ref 32.0–36.0)
MCV: 93.4 fL (ref 79.3–98.0)
MONO#: 0.8 10*3/uL (ref 0.1–0.9)
MONO%: 15.9 % — AB (ref 0.0–14.0)
NEUT#: 3.3 10*3/uL (ref 1.5–6.5)
NEUT%: 63.2 % (ref 39.0–75.0)
Platelets: 179 10*3/uL (ref 140–400)
RBC: 3.81 10*6/uL — AB (ref 4.20–5.82)
RDW: 13.7 % (ref 11.0–14.6)
WBC: 5.2 10*3/uL (ref 4.0–10.3)
lymph#: 0.9 10*3/uL (ref 0.9–3.3)

## 2017-03-28 NOTE — Telephone Encounter (Signed)
As noted below by Dr. Alen Blew, I informed patient of his platelet level. Patient verbalized understanding.

## 2017-03-28 NOTE — Telephone Encounter (Signed)
-----   Message from Wyatt Portela, MD sent at 03/28/2017  9:00 AM EDT ----- Please let him know his platelets are normal now.

## 2017-04-03 ENCOUNTER — Encounter: Payer: Self-pay | Admitting: Family Medicine

## 2017-04-03 ENCOUNTER — Telehealth: Payer: Self-pay | Admitting: Family Medicine

## 2017-04-03 NOTE — Telephone Encounter (Signed)
Go ahead and give him a letter

## 2017-04-03 NOTE — Telephone Encounter (Signed)
Pt states that he is going to the [place] tomorrow. He is requesting a letter stating that he can use back steps with a landing to get to his seats. The steps he is required to use are straight up without a landing. He would like to use the other steps due to his history of heart issues. Please respond to Beverlee Nims as I am leaving early today. He will need TODAY.

## 2017-04-03 NOTE — Telephone Encounter (Signed)
Letter is complete and pt aware and coming to pick up.

## 2017-04-08 DIAGNOSIS — R079 Chest pain, unspecified: Secondary | ICD-10-CM | POA: Diagnosis not present

## 2017-04-08 DIAGNOSIS — R0789 Other chest pain: Secondary | ICD-10-CM | POA: Diagnosis not present

## 2017-04-08 DIAGNOSIS — R0602 Shortness of breath: Secondary | ICD-10-CM | POA: Diagnosis not present

## 2017-04-08 DIAGNOSIS — R030 Elevated blood-pressure reading, without diagnosis of hypertension: Secondary | ICD-10-CM | POA: Diagnosis not present

## 2017-04-08 DIAGNOSIS — R072 Precordial pain: Secondary | ICD-10-CM | POA: Diagnosis not present

## 2017-04-11 ENCOUNTER — Other Ambulatory Visit (HOSPITAL_BASED_OUTPATIENT_CLINIC_OR_DEPARTMENT_OTHER): Payer: Medicare Other

## 2017-04-11 ENCOUNTER — Telehealth: Payer: Self-pay | Admitting: Oncology

## 2017-04-11 ENCOUNTER — Ambulatory Visit (HOSPITAL_BASED_OUTPATIENT_CLINIC_OR_DEPARTMENT_OTHER): Payer: Medicare Other | Admitting: Oncology

## 2017-04-11 VITALS — BP 119/68 | HR 64 | Temp 97.8°F | Resp 18 | Ht 72.0 in | Wt 158.1 lb

## 2017-04-11 DIAGNOSIS — H01004 Unspecified blepharitis left upper eyelid: Secondary | ICD-10-CM | POA: Diagnosis not present

## 2017-04-11 DIAGNOSIS — H2513 Age-related nuclear cataract, bilateral: Secondary | ICD-10-CM | POA: Diagnosis not present

## 2017-04-11 DIAGNOSIS — D696 Thrombocytopenia, unspecified: Secondary | ICD-10-CM

## 2017-04-11 DIAGNOSIS — D693 Immune thrombocytopenic purpura: Secondary | ICD-10-CM

## 2017-04-11 DIAGNOSIS — C434 Malignant melanoma of scalp and neck: Secondary | ICD-10-CM

## 2017-04-11 DIAGNOSIS — Z85820 Personal history of malignant melanoma of skin: Secondary | ICD-10-CM | POA: Diagnosis not present

## 2017-04-11 DIAGNOSIS — C61 Malignant neoplasm of prostate: Secondary | ICD-10-CM

## 2017-04-11 DIAGNOSIS — H524 Presbyopia: Secondary | ICD-10-CM | POA: Diagnosis not present

## 2017-04-11 DIAGNOSIS — H01001 Unspecified blepharitis right upper eyelid: Secondary | ICD-10-CM | POA: Diagnosis not present

## 2017-04-11 LAB — CBC WITH DIFFERENTIAL/PLATELET
BASO%: 0.9 % (ref 0.0–2.0)
Basophils Absolute: 0 10*3/uL (ref 0.0–0.1)
EOS%: 1.9 % (ref 0.0–7.0)
Eosinophils Absolute: 0.1 10*3/uL (ref 0.0–0.5)
HCT: 37.7 % — ABNORMAL LOW (ref 38.4–49.9)
HGB: 12.8 g/dL — ABNORMAL LOW (ref 13.0–17.1)
LYMPH%: 23.2 % (ref 14.0–49.0)
MCH: 32.1 pg (ref 27.2–33.4)
MCHC: 34 g/dL (ref 32.0–36.0)
MCV: 94.5 fL (ref 79.3–98.0)
MONO#: 0.7 10*3/uL (ref 0.1–0.9)
MONO%: 15.6 % — AB (ref 0.0–14.0)
NEUT%: 58.4 % (ref 39.0–75.0)
NEUTROS ABS: 2.7 10*3/uL (ref 1.5–6.5)
Platelets: 124 10*3/uL — ABNORMAL LOW (ref 140–400)
RBC: 3.99 10*6/uL — AB (ref 4.20–5.82)
RDW: 14.1 % (ref 11.0–14.6)
WBC: 4.6 10*3/uL (ref 4.0–10.3)
lymph#: 1.1 10*3/uL (ref 0.9–3.3)

## 2017-04-11 NOTE — Telephone Encounter (Signed)
Appointments scheduled per 04/11/17 los. Patient was given a copy of the AVS report and appointment schedule per 04/11/17 los. °

## 2017-04-11 NOTE — Progress Notes (Signed)
Hematology and Oncology Follow Up Visit  Corey Oneill [id number] [birth year] 81 y.o. 04/11/2017 10:28 AM Corey Oneill, MDLalonde, Corey Jarvis, MD   Principle Diagnosis: 81 year old gentleman with the following issues:  1. 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 This was diagnosed in December 2017 and he underwent wide excision completed on 10/24/2016. The final pathology revealed a 0.39 mm Clark's level was IV superficial spreading melanoma. No ulceration was noted with very low mitotic index. The final pathological staging was T1 pain. No lymph node sampling was done.  2. 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  Prior Therapy:   He is status post wide excision of his melanoma in December 2017.  Prednisone therapy currently at 60 mg daily started in February 2018 and was tapered to off in May 2018.  Current therapy: Observation and surveillance.  Interim History:  Corey Oneill presents today for a follow-up visit. Since the last visit, he reports feeling better than he fell last few days. He traveled to [place] over the weekend and developed the symptoms of abdominal pain and nausea and was evaluated in the emergency department for possible cardiac event. No cardiac issues detected at the time and symptoms have resolved at this time. He no longer reports any chest pain or nausea. His appetite is back to normal at this time.  He has been off prednisone for the last few weeks without any delayed complications. He does report periodic hematochezia and he is scheduled to have follow-up with his gastroenterologist in the near future to have a repeat colonoscopy. He denied any hemoptysis or hematemesis. He denied any petechiae or ecchymosis.   He does not report any headaches, blurry vision, syncope or seizures. He does not report any fevers, chills, sweats or weight loss. He does  not report any chest pain, palpitation, orthopnea or leg edema. He does not report any cough, wheezing or hemoptysis. He does not report any nausea, vomiting or abdominal pain. He does not report any frequency urgency or hesitancy. He does not report any skeletal complaints. Remaining review of systems unremarkable.   Medications: I have reviewed the patient's current medications.  Current Outpatient Prescriptions  Medication Sig Dispense Refill  . acetaminophen (TYLENOL) 500 MG tablet Take 500 mg by mouth every 8 (eight) hours as needed for mild pain. Reported on 11/26/2015    . aspirin EC 81 MG tablet Take 81 mg by mouth daily.    . carvedilol (COREG) 3.125 MG tablet Take 1 tablet (3.125 mg total) by mouth 2 (two) times daily. 60 tablet 3  . Cholecalciferol (VITAMIN D PO) Take 2,500 mcg by mouth daily.    . cyanocobalamin 2000 MCG tablet Take 2,000 mcg by mouth daily.    . DULoxetine (CYMBALTA) 30 MG capsule Take 1 capsule (30 mg total) by mouth daily. 30 capsule 5  . isosorbide mononitrate (IMDUR) 30 MG 24 hr tablet Take one-half tablet by mouth daily (Patient taking differently: Take 15 mg by mouth daily. Take one-half tablet by mouth daily) 45 tablet 3  . nitroGLYCERIN (NITROSTAT) 0.4 MG SL tablet Place 1 tablet (0.4 mg total) under the tongue every 5 (five) minutes as needed for chest pain. (Patient not taking: Reported on 01/04/2017) 25 tablet 3  . predniSONE (DELTASONE) 10 MG tablet Take 30 mg for a week.  Take 20 mg for a week Take 10 mg for a week.  Then stop. (Patient not taking: Reported on 03/22/2017) 20 tablet 0  . predniSONE (DELTASONE) 20 MG tablet Take three tablets daily. (Patient not taking: Reported on 03/22/2017) 90 tablet 1  . rosuvastatin (CRESTOR) 10 MG tablet Take 1 tablet (10 mg total) by mouth daily. 90 tablet 1   No current facility-administered medications for this visit.      Allergies: No Known Allergies  Past Medical History, Surgical history, Social history, and  Family History were reviewed and updated.  Physical Exam: Blood pressure 119/68, pulse 64, temperature 97.8 F (36.6 C), temperature source Oral, resp. rate 18, height 6' (1.829 m), weight 158 lb 1.6 oz (71.7 kg), SpO2 99 %. ECOG: 0 General appearance: Well-appearing gentleman without distress. Head: Normocephalic, without obvious abnormality no oral ulcers or thrush. Neck: no adenopathy Lymph nodes: Cervical, supraclavicular, and axillary nodes normal. Heart:regular rate and rhythm, S1, S2 normal, no murmur, click, rub or gallop Oneill:chest clear, no wheezing, rales, normal symmetric air entry Abdomin: soft, non-tender, without masses or organomegaly no shifting dullness or ascites. EXT:no erythema, induration, or nodules   Lab Results: Lab Results  Component Value Date   WBC 4.6 04/11/2017   HGB 12.8 (L) 04/11/2017   HCT 37.7 (L) 04/11/2017   MCV 94.5 04/11/2017   PLT 124 (L) 04/11/2017     Chemistry      Component Value Date/Time   NA 141 12/26/2016 0742   NA 141 12/25/2016 1419   K 4.8 12/26/2016 0742   CL 106 12/26/2016 0742   CO2 30 12/26/2016 0742   BUN 12 12/26/2016 0742   BUN 13 12/25/2016 1419   CREATININE 0.86 12/26/2016 0742   CREATININE 0.91 01/05/2016 1238      Component Value Date/Time   CALCIUM 9.0 12/26/2016 0742   ALKPHOS 76 02/20/2016 1559   AST 26 02/20/2016 1559   ALT 18 02/20/2016 1559   BILITOT 0.7 02/20/2016 1559       Impression and Plan:  81 year old gentleman with the following issues:  1. 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 This was diagnosed in December 2017 and he underwent wide excision completed on 10/24/2016. The final pathology revealed a 0.39 mm Clark's level was IV superficial spreading melanoma. No ulceration was noted with very low mitotic index. The final pathological staging was T1 pain. No lymph node sampling was done.  He remains on active surveillance.  2. Thrombocytopenia: Diagnosed  in February 2018 with a platelet count around 62,000. He has a normal CBC otherwise with a normal differential and a normal peripheral smear. These findings suggest ITP.  He had a complete response to prednisone with a platelet count of 179 on 03/28/2017. He has been tapered completely off prednisone and platelet count remains close to normal at 124.  The plan is to continue with active surveillance at this time without prednisone. We will check his pelvic ounces every 3-4 weeks and institute treatment if his platelets dropped again. Therefore salvage regimens would include IVIG, rituximab and possible splenectomy.  3. Follow-up: Laboratory every 3-4 weeks and follow-up in August 2018.   Zola Button, MD 5/30/201810:28 AM

## 2017-04-12 ENCOUNTER — Ambulatory Visit (INDEPENDENT_AMBULATORY_CARE_PROVIDER_SITE_OTHER): Payer: Medicare Other | Admitting: Cardiovascular Disease

## 2017-04-12 ENCOUNTER — Encounter: Payer: Self-pay | Admitting: Cardiovascular Disease

## 2017-04-12 VITALS — BP 110/64 | HR 59 | Ht 72.0 in | Wt 157.4 lb

## 2017-04-12 DIAGNOSIS — I25118 Atherosclerotic heart disease of native coronary artery with other forms of angina pectoris: Secondary | ICD-10-CM

## 2017-04-12 DIAGNOSIS — I359 Nonrheumatic aortic valve disorder, unspecified: Secondary | ICD-10-CM

## 2017-04-12 DIAGNOSIS — I209 Angina pectoris, unspecified: Secondary | ICD-10-CM | POA: Diagnosis not present

## 2017-04-12 NOTE — Patient Instructions (Signed)

## 2017-04-12 NOTE — Progress Notes (Signed)
Cardiology Office Note Date:  04/12/2017   ID:  Corey Oneill, DOB 1936-10-19, MRN [id number]  PCP:  [name], MD  Cardiologist:  [name], MD    Chief Complaint  Patient presents with  . Chest Pain     History of Present Illness: Corey Oneill is a 81 y.o. male who presents for follow-up evaluation. He was recently seen in the Emergency Dept in [place], [place], when he was at the [facility] for evaluation of chest pain.   The patient has a hx of CAD s/p PCI of the RCA and also aortic valve disease s/p TAVR in April 2017.   He had walked up to the 35th row twice, then developed chest discomfort. The weather was very hot. He developed chest discomfort and took a NTG. After the NTG he became weak, developed a headache, and felt lightheaded. Symptoms resolved after he sat down and rested. The chest discomfort duration was approximately 20 minutes. In the ER his EKG and cardiac enzymes were negative.   Since he's been home he's done very well. He is back to walking his dog for about 30 minutes without any symptoms. He specifically denies recurrent chest pain, shortness of breath, or heart palpitations. He complains of generalized fatigue but no other specific symptoms.  Past Medical History:  Diagnosis Date  . Anginal pain ([place])   . Aortic stenosis    a. peak to peak gradient by LHC 8/16:  37 mmHg (moderately severe)   . CAD (coronary artery disease)    a.  LHC 8/16: Mid to distal LAD 30%, OM1 40%, proximal mid RCA 40%, distal RCA 60% >> FFR 0.69  >> PCI: 3 x 15 mm Resolute DES  . Depression   . Esophageal reflux   . Family history of adverse reaction to anesthesia    "daughter has PONV"  . H/O blood clots    "get them in my stool and urine" (11/12/2015)  . Heart murmur   . Hx of radiation therapy 04/14/13-06/09/13   prostate 7800 cGy, 40 sessions, seminal vesicles 5600 cGy 40 sessions  . Hyperlipidemia   . 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 Prostate cancer ([place]) dx'd 2014  . Radiation proctitis     Past Surgical History:  Procedure Laterality Date  . CARDIAC CATHETERIZATION N/A 04/21/2015   Procedure: Right/Left Heart Cath and Coronary Angiography;  Surgeon: [name], MD;  Location: [facility];  Service: Cardiovascular;  Laterality: N/A;  . CARDIAC CATHETERIZATION N/A 06/18/2015   Procedure: Intravascular Pressure Wire/FFR Study;  Surgeon: [name], MD;  Location: [facility];  Service: Cardiovascular;  Laterality: N/A;  . CARDIAC CATHETERIZATION N/A 06/18/2015   Procedure: Coronary Stent Intervention;  Surgeon: [name], MD;  Location: [facility];  Service: Cardiovascular;  Laterality: N/A;  . CARDIAC CATHETERIZATION N/A 06/18/2015   Procedure: Right/Left Heart Cath and Coronary Angiography;  Surgeon: [name], MD;  Location: [facility];  Service: Cardiovascular;  Laterality: N/A;  . CARDIAC CATHETERIZATION N/A 06/23/2015   Procedure: Left Heart Cath and Cors/Grafts Angiography;  Surgeon: [name], MD;  Location: [facility];  Service: Cardiovascular;  Laterality: N/A;  . CARDIAC CATHETERIZATION N/A 01/17/2016   Procedure: Right/Left Heart Cath and Coronary Angiography;  Surgeon: [name], MD;  Location: [facility];  Service: Cardiovascular;  Laterality: N/A;  . COLONOSCOPY    .  FRACTURE SURGERY    . INGUINAL HERNIA REPAIR    . LAPAROSCOPIC APPENDECTOMY N/A 11/16/2015   Procedure: APPENDECTOMY LAPAROSCOPIC;  Surgeon: [name], MD;  Location: [facility];  Service: General;  Laterality: N/A;  . LEFT HEART CATH AND CORONARY ANGIOGRAPHY N/A 12/26/2016   Procedure: Left Heart Cath and Coronary Angiography;  Surgeon: Troy Sine, MD;  Location: [facility];  Service: Cardiovascular;  Laterality: N/A;  . MELANOMA EXCISION Right 10/24/2016   Procedure: WIDE EXCISION MELANOMA RIGHT NECK AND RIGHT CHEST TIMES 2;  Surgeon: [name], MD;  Location:  [facility];  Service: General;  Laterality: Right;  right medial and lateral lesion of chest and right neck  . NASAL FRACTURE SURGERY     "broken years ago; several ORs to correct it"  . NASAL SEPTUM SURGERY    . PROSTATE BIOPSY  2014   "needle biopsy"  . TEE WITHOUT CARDIOVERSION N/A 02/15/2016   Procedure: TRANSESOPHAGEAL ECHOCARDIOGRAM (TEE);  Surgeon: [name], MD;  Location: [facility];  Service: Open Heart Surgery;  Laterality: N/A;  . TONSILLECTOMY    . TRANSCATHETER AORTIC VALVE REPLACEMENT, TRANSFEMORAL  02/15/2016  . TRANSCATHETER AORTIC VALVE REPLACEMENT, TRANSFEMORAL N/A 02/15/2016   Procedure: TRANSCATHETER AORTIC VALVE REPLACEMENT, TRANSFEMORAL;  Surgeon: [name], MD;  Location: [facility];  Service: Open Heart Surgery;  Laterality: N/A;    Current Outpatient Prescriptions  Medication Sig Dispense Refill  . acetaminophen (TYLENOL) 500 MG tablet Take 500 mg by mouth every 8 (eight) hours as needed for mild pain. Reported on 11/26/2015    . aspirin EC 81 MG tablet Take 81 mg by mouth daily.    . carvedilol (COREG) 3.125 MG tablet Take 3.125 mg by mouth 2 (two) times daily with a meal.    . Cholecalciferol (VITAMIN D PO) Take 2,500 mcg by mouth daily.    . cyanocobalamin 2000 MCG tablet Take 2,000 mcg by mouth daily.    . DULoxetine (CYMBALTA) 30 MG capsule Take 1 capsule (30 mg total) by mouth daily. 30 capsule 5  . isosorbide mononitrate (IMDUR) 30 MG 24 hr tablet Take 15 mg by mouth daily. Take 1/2 tablet by mouth daily    . nitroGLYCERIN (NITROSTAT) 0.4 MG SL tablet Place 1 tablet (0.4 mg total) under the tongue every 5 (five) minutes as needed for chest pain. 25 tablet 3  . rosuvastatin (CRESTOR) 10 MG tablet Take 1 tablet (10 mg total) by mouth daily. 90 tablet 1   No current facility-administered medications for this visit.     Allergies:   Patient has no known allergies.   Social History:  The patient  reports that he has quit smoking. His smoking use  included Cigarettes. He smoked 0.00 packs per day. He has never used smokeless tobacco. He reports that he drinks alcohol. He reports that he does not use drugs.   Family History:  The patient's  family history includes Brain cancer in his father; Depression in his daughter; Lymphoma in his mother.    ROS:  Please see the history of present illness.   All other systems are reviewed and negative.    PHYSICAL EXAM: VS:  BP 110/64   Pulse (!) 59   Ht 6' (1.829 m)   Wt 157 lb 6.4 oz (71.4 kg)   BMI 21.35 kg/m  , BMI Body mass index is 21.35 kg/m. GEN: Well nourished, well developed, in no acute distress  HEENT: normal  Neck: no JVD, no masses. No  carotid bruits Cardiac: RRR without murmur or gallop                Respiratory:  clear to auscultation bilaterally, normal work of breathing GI: soft, nontender, nondistended, + BS MS: no deformity or atrophy  Ext: no pretibial edema, pedal pulses 2+= bilaterally Skin: warm and dry, no rash Neuro:  Strength and sensation are intact Psych: euthymic mood, full affect  EKG:  EKG is not ordered today.  Recent Labs: 12/26/2016: BUN 12; Creatinine, Ser 0.86; Potassium 4.8; Sodium 141 04/11/2017: HGB 12.8; Platelets 124   Lipid Panel     Component Value Date/Time   CHOL 155 06/18/2015 0234   TRIG 99 06/18/2015 0234   HDL 49 06/18/2015 0234   CHOLHDL 3.2 06/18/2015 0234   VLDL 20 06/18/2015 0234   LDLCALC 86 06/18/2015 0234      Wt Readings from Last 3 Encounters:  04/12/17 157 lb 6.4 oz (71.4 kg)  04/11/17 158 lb 1.6 oz (71.7 kg)  03/22/17 157 lb (71.2 kg)     Cardiac Studies Reviewed: Cardiac Cath 12/26/2016: Conclusion     Prox RCA lesion, 35 %stenosed.  Mid RCA lesion, 30 %stenosed.  Dist RCA lesion, 0 %stenosed.  Mid LAD lesion, 20 %stenosed.   Mild nonobstructive CAD with smooth 20% narrowing in the LAD after the proximal septal perforating artery; normal left circumflex coronary artery; mild calcification of the  RCA with 30-40% proximal stenosis, 30% mid stenosis, and patent distal RCA stent.  The TAVR valve appeared well-seated.   ASSESSMENT AND PLAN: 1.  Coronary artery disease, native vessel, with angina: Recent heart catheterization reviewed as above. The patient appears stable at this time and I've asked him to continue low-dose carvedilol and isosorbide. He remains on aspirin and a statin drug. His emergency room evaluation in [place] was negative. He's had no recurrent symptoms. With recent stress test and heart catheterization I have not recommended any repeat testing. If he has recurrent symptoms of angina asked him to contact us.  2. Aortic stenosis status post TAVR: Normal functioning transcatheter heart valve on recent echo. Continue current therapy. Follow SBE prophylaxis when indicated.  3. Hyperlipidemia: Treated with Crestor 10 mg.  Current medicines are reviewed with the patient today.  The patient does not have concerns regarding medicines.  Labs/ tests ordered today include:  No orders of the defined types were placed in this encounter.  Disposition:   FU 6 months  Signed, [name], MD  04/12/2017 12:56 PM    [address], [place], [place]  [address] Phone: [phone]; Fax: [phone]

## 2017-04-13 ENCOUNTER — Other Ambulatory Visit: Payer: Self-pay

## 2017-04-13 DIAGNOSIS — I359 Nonrheumatic aortic valve disorder, unspecified: Secondary | ICD-10-CM

## 2017-04-16 DIAGNOSIS — F321 Major depressive disorder, single episode, moderate: Secondary | ICD-10-CM | POA: Diagnosis not present

## 2017-04-30 ENCOUNTER — Other Ambulatory Visit: Payer: Self-pay | Admitting: Cardiology

## 2017-04-30 DIAGNOSIS — F321 Major depressive disorder, single episode, moderate: Secondary | ICD-10-CM | POA: Diagnosis not present

## 2017-05-02 ENCOUNTER — Telehealth: Payer: Self-pay | Admitting: *Deleted

## 2017-05-02 ENCOUNTER — Other Ambulatory Visit (HOSPITAL_BASED_OUTPATIENT_CLINIC_OR_DEPARTMENT_OTHER): Payer: Medicare Other

## 2017-05-02 DIAGNOSIS — D696 Thrombocytopenia, unspecified: Secondary | ICD-10-CM

## 2017-05-02 DIAGNOSIS — D693 Immune thrombocytopenic purpura: Secondary | ICD-10-CM

## 2017-05-02 LAB — CBC WITH DIFFERENTIAL/PLATELET
BASO%: 0.9 % (ref 0.0–2.0)
BASOS ABS: 0 10*3/uL (ref 0.0–0.1)
EOS ABS: 0.2 10*3/uL (ref 0.0–0.5)
EOS%: 3.9 % (ref 0.0–7.0)
HCT: 38.3 % — ABNORMAL LOW (ref 38.4–49.9)
HEMOGLOBIN: 13.2 g/dL (ref 13.0–17.1)
LYMPH%: 24.6 % (ref 14.0–49.0)
MCH: 32.5 pg (ref 27.2–33.4)
MCHC: 34.4 g/dL (ref 32.0–36.0)
MCV: 94.5 fL (ref 79.3–98.0)
MONO#: 0.6 10*3/uL (ref 0.1–0.9)
MONO%: 13.8 % (ref 0.0–14.0)
NEUT#: 2.7 10*3/uL (ref 1.5–6.5)
NEUT%: 56.8 % (ref 39.0–75.0)
Platelets: 113 10*3/uL — ABNORMAL LOW (ref 140–400)
RBC: 4.05 10*6/uL — ABNORMAL LOW (ref 4.20–5.82)
RDW: 13.5 % (ref 11.0–14.6)
WBC: 4.7 10*3/uL (ref 4.0–10.3)
lymph#: 1.2 10*3/uL (ref 0.9–3.3)

## 2017-05-02 NOTE — Telephone Encounter (Signed)
-----   Message from Wyatt Portela, MD sent at 05/02/2017 11:48 AM EDT ----- Please let him know his Platelets are still good.

## 2017-05-02 NOTE — Telephone Encounter (Signed)
As noted below by Dr. Alen Blew, I informed the patient of his platelet count. Patient verbalized understanding.

## 2017-05-04 ENCOUNTER — Ambulatory Visit (INDEPENDENT_AMBULATORY_CARE_PROVIDER_SITE_OTHER): Payer: Medicare Other | Admitting: Family Medicine

## 2017-05-04 ENCOUNTER — Encounter: Payer: Self-pay | Admitting: Family Medicine

## 2017-05-04 VITALS — BP 110/70 | HR 77 | Resp 16 | Wt 158.8 lb

## 2017-05-04 DIAGNOSIS — I25118 Atherosclerotic heart disease of native coronary artery with other forms of angina pectoris: Secondary | ICD-10-CM

## 2017-05-04 DIAGNOSIS — G4700 Insomnia, unspecified: Secondary | ICD-10-CM

## 2017-05-04 NOTE — Progress Notes (Signed)
   Subjective:    Patient ID: Corey Oneill, male    DOB: 06-Mar-1936, 81 y.o.   MRN: [id number]  HPI He is here for consult concerning difficulty with insomnia over the last 2 months. He states that he has trouble getting to sleep and staying asleep. He has tried melatonin but not on a regular basis. He is under no undue stress other than he is not as involved with his work as he was in the past. He has had to cut back on this because of underlying cardiac issues. Medications have been unchanged. He does note that caffeine can play a role in this. He does occasionally drink.   Review of Systems     Objective:   Physical Exam Alert and in no distress otherwise not examined       Assessment & Plan:  Insomnia, unspecified type Information concerning insomnia was given in regard to diet, exercise, alcohol consumption, caffeine, decongestants. Discussed proper sleep hygiene with him and to use melatonin. If he continues have difficulty I will reevaluate.

## 2017-05-04 NOTE — Patient Instructions (Signed)

## 2017-05-29 ENCOUNTER — Other Ambulatory Visit: Payer: Self-pay | Admitting: Cardiology

## 2017-05-29 ENCOUNTER — Other Ambulatory Visit (HOSPITAL_BASED_OUTPATIENT_CLINIC_OR_DEPARTMENT_OTHER): Payer: Medicare Other

## 2017-05-29 DIAGNOSIS — F321 Major depressive disorder, single episode, moderate: Secondary | ICD-10-CM | POA: Diagnosis not present

## 2017-05-29 DIAGNOSIS — D696 Thrombocytopenia, unspecified: Secondary | ICD-10-CM | POA: Diagnosis present

## 2017-05-29 DIAGNOSIS — D693 Immune thrombocytopenic purpura: Secondary | ICD-10-CM

## 2017-05-29 LAB — CBC WITH DIFFERENTIAL/PLATELET
BASO%: 0.4 % (ref 0.0–2.0)
BASOS ABS: 0 10*3/uL (ref 0.0–0.1)
EOS ABS: 0.2 10*3/uL (ref 0.0–0.5)
EOS%: 3.9 % (ref 0.0–7.0)
HEMATOCRIT: 40.3 % (ref 38.4–49.9)
HEMOGLOBIN: 13.5 g/dL (ref 13.0–17.1)
LYMPH#: 1.3 10*3/uL (ref 0.9–3.3)
LYMPH%: 27.5 % (ref 14.0–49.0)
MCH: 32.2 pg (ref 27.2–33.4)
MCHC: 33.5 g/dL (ref 32.0–36.0)
MCV: 96.2 fL (ref 79.3–98.0)
MONO#: 0.5 10*3/uL (ref 0.1–0.9)
MONO%: 11.6 % (ref 0.0–14.0)
NEUT#: 2.6 10*3/uL (ref 1.5–6.5)
NEUT%: 56.6 % (ref 39.0–75.0)
Platelets: 76 10*3/uL — ABNORMAL LOW (ref 140–400)
RBC: 4.19 10*6/uL — ABNORMAL LOW (ref 4.20–5.82)
RDW: 12.7 % (ref 11.0–14.6)
WBC: 4.7 10*3/uL (ref 4.0–10.3)

## 2017-05-30 ENCOUNTER — Telehealth: Payer: Self-pay | Admitting: *Deleted

## 2017-05-30 NOTE — Telephone Encounter (Signed)
-----   Message from Wyatt Portela, MD sent at 05/29/2017 10:37 AM EDT ----- Please let him know his platelets are down but still safe. No change for now.

## 2017-05-30 NOTE — Telephone Encounter (Signed)
Per Dr. Alen Blew, I informed patient that his platelets are down but still safe. No changes for now. Patient verbalized understanding.

## 2017-05-31 ENCOUNTER — Telehealth: Payer: Self-pay | Admitting: *Deleted

## 2017-05-31 NOTE — Telephone Encounter (Signed)
returned call to patient, LM to call me.

## 2017-06-04 ENCOUNTER — Other Ambulatory Visit: Payer: Self-pay

## 2017-06-06 ENCOUNTER — Ambulatory Visit (HOSPITAL_BASED_OUTPATIENT_CLINIC_OR_DEPARTMENT_OTHER): Payer: Medicare Other | Admitting: Oncology

## 2017-06-06 ENCOUNTER — Other Ambulatory Visit (HOSPITAL_BASED_OUTPATIENT_CLINIC_OR_DEPARTMENT_OTHER): Payer: Medicare Other

## 2017-06-06 VITALS — BP 116/73 | HR 72 | Temp 97.6°F | Resp 18 | Ht 72.0 in | Wt 157.7 lb

## 2017-06-06 DIAGNOSIS — D696 Thrombocytopenia, unspecified: Secondary | ICD-10-CM | POA: Diagnosis not present

## 2017-06-06 DIAGNOSIS — Z85820 Personal history of malignant melanoma of skin: Secondary | ICD-10-CM | POA: Diagnosis not present

## 2017-06-06 DIAGNOSIS — C61 Malignant neoplasm of prostate: Secondary | ICD-10-CM

## 2017-06-06 DIAGNOSIS — K921 Melena: Secondary | ICD-10-CM | POA: Diagnosis not present

## 2017-06-06 DIAGNOSIS — Z8546 Personal history of malignant neoplasm of prostate: Secondary | ICD-10-CM | POA: Insufficient documentation

## 2017-06-06 DIAGNOSIS — R197 Diarrhea, unspecified: Secondary | ICD-10-CM | POA: Insufficient documentation

## 2017-06-06 DIAGNOSIS — D693 Immune thrombocytopenic purpura: Secondary | ICD-10-CM

## 2017-06-06 DIAGNOSIS — Z86010 Personal history of colonic polyps: Secondary | ICD-10-CM | POA: Insufficient documentation

## 2017-06-06 DIAGNOSIS — Z860101 Personal history of adenomatous and serrated colon polyps: Secondary | ICD-10-CM | POA: Insufficient documentation

## 2017-06-06 LAB — CBC WITH DIFFERENTIAL/PLATELET
BASO%: 0.4 % (ref 0.0–2.0)
BASOS ABS: 0 10*3/uL (ref 0.0–0.1)
EOS%: 4.1 % (ref 0.0–7.0)
Eosinophils Absolute: 0.2 10*3/uL (ref 0.0–0.5)
HEMATOCRIT: 38.3 % — AB (ref 38.4–49.9)
HGB: 12.9 g/dL — ABNORMAL LOW (ref 13.0–17.1)
LYMPH#: 1.3 10*3/uL (ref 0.9–3.3)
LYMPH%: 27.3 % (ref 14.0–49.0)
MCH: 32.3 pg (ref 27.2–33.4)
MCHC: 33.7 g/dL (ref 32.0–36.0)
MCV: 95.8 fL (ref 79.3–98.0)
MONO#: 0.9 10*3/uL (ref 0.1–0.9)
MONO%: 17.4 % — ABNORMAL HIGH (ref 0.0–14.0)
NEUT#: 2.5 10*3/uL (ref 1.5–6.5)
NEUT%: 50.8 % (ref 39.0–75.0)
PLATELETS: 80 10*3/uL — AB (ref 140–400)
RBC: 4 10*6/uL — AB (ref 4.20–5.82)
RDW: 12.4 % (ref 11.0–14.6)
WBC: 4.9 10*3/uL (ref 4.0–10.3)

## 2017-06-06 NOTE — Progress Notes (Signed)
Hematology and Oncology Follow Up Visit  Corey Oneill [id number] Mar 31, 1936 81 y.o. 06/06/2017 3:29 PM Corey Oneill, MDLalonde, Corey Jarvis, MD   Principle Diagnosis: 81 year old gentleman with the following issues:  1. 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 This was diagnosed in December 2017 and he underwent wide excision completed on 10/24/2016. The final pathology revealed a 0.39 mm Clark's level was IV superficial spreading melanoma. No ulceration was noted with very low mitotic index. The final pathological staging was T1 pain. No lymph node sampling was done.  2. 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  Prior Therapy:   He is status post wide excision of his melanoma in December 2017.  Prednisone therapy currently at 60 mg daily started in February 2018 and was tapered to off in May 2018.  Current therapy: Observation and surveillance.  Interim History:  Corey Oneill presents today for a follow-up visit. Since the last visit, he reports no changes in his health. He does not report any bleeding or thrombosis episodes. He continues to attend to activities of daily living without any decline. He does not report any hematochezia or melena. He is scheduled to have a colonoscopy in the near future.   He does not report any headaches, blurry vision, syncope or seizures. He does not report any fevers, chills, sweats or weight loss. He does not report any chest pain, palpitation, orthopnea or leg edema. He does not report any cough, wheezing or hemoptysis. He does not report any nausea, vomiting or abdominal pain. He does not report any frequency urgency or hesitancy. He does not report any skeletal complaints. Remaining review of systems unremarkable.   Medications: I have reviewed the patient's current medications.  Current Outpatient Prescriptions  Medication Sig Dispense  Refill  . acetaminophen (TYLENOL) 500 MG tablet Take 500 mg by mouth every 8 (eight) hours as needed for mild pain. Reported on 11/26/2015    . aspirin EC 81 MG tablet Take 81 mg by mouth daily.    . carvedilol (COREG) 3.125 MG tablet TAKE 1 TABLET TWICE DAILY. 60 tablet 10  . cyanocobalamin 2000 MCG tablet Take 2,000 mcg by mouth daily.    . DULoxetine (CYMBALTA) 30 MG capsule Take 1 capsule (30 mg total) by mouth daily. 30 capsule 5  . isosorbide mononitrate (IMDUR) 30 MG 24 hr tablet Take 15 mg by mouth daily. Take 1/2 tablet by mouth daily    . nitroGLYCERIN (NITROSTAT) 0.4 MG SL tablet Place 1 tablet (0.4 mg total) under the tongue every 5 (five) minutes as needed for chest pain. 25 tablet 3  . rosuvastatin (CRESTOR) 10 MG tablet Take 1 tablet (10 mg total) by mouth daily. 90 tablet 1   No current facility-administered medications for this visit.      Allergies: No Known Allergies  Past Medical History, Surgical history, Social history, and Family History were reviewed and updated.  Physical Exam: Blood pressure 116/73, pulse 72, temperature 97.6 F (36.4 C), temperature source Oral, resp. rate 18, height 6' (1.829 m), weight 157 lb 11.2 oz (71.5 kg), SpO2 100 %. ECOG: 0 General appearance: Alert, awake gentleman without distress. Head: Normocephalic, without obvious abnormality no oral ulcers or lesions. Neck: no adenopathy Lymph nodes: Cervical, supraclavicular, and axillary nodes normal. Heart:regular rate and rhythm, S1, S2 normal, no murmur, click, rub or gallop Oneill:chest clear, no wheezing, rales, normal symmetric air entry  Abdomin: soft, non-tender, without masses or organomegaly no rebound or guarding. EXT:no erythema, induration, or nodules   Lab Results: Lab Results  Component Value Date   WBC 4.9 06/06/2017   HGB 12.9 (L) 06/06/2017   HCT 38.3 (L) 06/06/2017   MCV 95.8 06/06/2017   PLT 80 (L) 06/06/2017     Chemistry      Component Value Date/Time   NA 141  12/26/2016 0742   NA 141 12/25/2016 1419   K 4.8 12/26/2016 0742   CL 106 12/26/2016 0742   CO2 30 12/26/2016 0742   BUN 12 12/26/2016 0742   BUN 13 12/25/2016 1419   CREATININE 0.86 12/26/2016 0742   CREATININE 0.91 01/05/2016 1238      Component Value Date/Time   CALCIUM 9.0 12/26/2016 0742   ALKPHOS 76 02/20/2016 1559   AST 26 02/20/2016 1559   ALT 18 02/20/2016 1559   BILITOT 0.7 02/20/2016 1559       Impression and Plan:  81 year old gentleman with the following issues:  1. 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 This was diagnosed in December 2017 and he underwent wide excision completed on 10/24/2016. The final pathology revealed a 0.39 mm Clark's level was IV superficial spreading melanoma. No ulceration was noted with very low mitotic index. The final pathological staging was T1 pain. No lymph node sampling was done.  He remains on active surveillance.  2. Thrombocytopenia: Diagnosed in February 2018 with a platelet count around 62,000. He has a normal CBC otherwise with a normal differential and a normal peripheral smear. These findings suggest ITP.  He had a complete response to prednisone with a platelet count of 179 on 03/28/2017. He has been tapered completely off prednisone and platelet count remains at a safe range of close to 80,000 without any evidence of bleeding.  The plan is to continue with laboratory data periodically and a follow-up in 6 weeks.   3. Follow-up: Laboratory every 2 weeks and follow-up in September 2018.   [facility], MD 7/25/20183:29 PM

## 2017-06-07 DIAGNOSIS — R197 Diarrhea, unspecified: Secondary | ICD-10-CM | POA: Diagnosis not present

## 2017-06-21 ENCOUNTER — Ambulatory Visit: Payer: Medicare Other | Admitting: Oncology

## 2017-06-21 ENCOUNTER — Telehealth: Payer: Self-pay | Admitting: *Deleted

## 2017-06-21 ENCOUNTER — Telehealth: Payer: Self-pay | Admitting: Family Medicine

## 2017-06-21 ENCOUNTER — Other Ambulatory Visit (HOSPITAL_BASED_OUTPATIENT_CLINIC_OR_DEPARTMENT_OTHER): Payer: Medicare Other

## 2017-06-21 DIAGNOSIS — D696 Thrombocytopenia, unspecified: Secondary | ICD-10-CM

## 2017-06-21 DIAGNOSIS — D693 Immune thrombocytopenic purpura: Secondary | ICD-10-CM

## 2017-06-21 LAB — CBC WITH DIFFERENTIAL/PLATELET
BASO%: 0.8 % (ref 0.0–2.0)
BASOS ABS: 0 10*3/uL (ref 0.0–0.1)
EOS ABS: 0.1 10*3/uL (ref 0.0–0.5)
EOS%: 3.3 % (ref 0.0–7.0)
HCT: 40.4 % (ref 38.4–49.9)
HGB: 13.8 g/dL (ref 13.0–17.1)
LYMPH%: 25.4 % (ref 14.0–49.0)
MCH: 32.3 pg (ref 27.2–33.4)
MCHC: 34.2 g/dL (ref 32.0–36.0)
MCV: 94.5 fL (ref 79.3–98.0)
MONO#: 0.6 10*3/uL (ref 0.1–0.9)
MONO%: 13.3 % (ref 0.0–14.0)
NEUT%: 57.2 % (ref 39.0–75.0)
NEUTROS ABS: 2.6 10*3/uL (ref 1.5–6.5)
PLATELETS: 75 10*3/uL — AB (ref 140–400)
RBC: 4.27 10*6/uL (ref 4.20–5.82)
RDW: 12.8 % (ref 11.0–14.6)
WBC: 4.5 10*3/uL (ref 4.0–10.3)
lymph#: 1.1 10*3/uL (ref 0.9–3.3)

## 2017-06-21 NOTE — Telephone Encounter (Signed)
-----   Message from Wyatt Portela, MD sent at 06/21/2017 10:00 AM EDT ----- Please let him know his platelets are not changed much.

## 2017-06-21 NOTE — Telephone Encounter (Signed)
Pt called and would like to talk to you he would like for you to call him at [phone] another dr prescribed him a RX but he wants to talk to you regarding him the RX is Creon and Dr Richmond Campbell prescribed it to him,

## 2017-06-21 NOTE — Telephone Encounter (Signed)
As noted below by Dr. Alen Blew, I informed patient of his platelet count. He verbalized understanding.

## 2017-06-22 NOTE — Telephone Encounter (Signed)
Called Dr. Liliane Channel office before 5 & had to leave another message, called pt back & informed

## 2017-06-22 NOTE — Telephone Encounter (Signed)
Pt called back and would really like to talk to you if you could please call him please see prior message pt can be reached at [phone]

## 2017-06-22 NOTE — Telephone Encounter (Signed)
Pt came by office asking about the new medication that was prescribed by Dr. Earlean Shawl.  Called Dr. Redmond School and he advised was for digestive problems and informed pt and he states he is no longer having the digestive issues that he was before with bowel issues.  I called Dr. Liliane Channel office and informed nurse, she will discuss with Dr. And see if pt still needs to get this medication due to it is expensive and call me back.

## 2017-06-25 DIAGNOSIS — F321 Major depressive disorder, single episode, moderate: Secondary | ICD-10-CM | POA: Diagnosis not present

## 2017-06-25 NOTE — Telephone Encounter (Signed)
Renee called back from [facility] Dr. 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 Earlean Shawl wanted to see if this would help pt.  Renee states this was an expensive medication and she sent it to [facility] to try to help with the cost of the medication .  She will call pt and explain it to him and see if he wants to proceed.  She said it was up to him whether he takes it or not.

## 2017-06-26 DIAGNOSIS — Z86010 Personal history of colonic polyps: Secondary | ICD-10-CM | POA: Diagnosis not present

## 2017-06-26 DIAGNOSIS — R197 Diarrhea, unspecified: Secondary | ICD-10-CM | POA: Diagnosis not present

## 2017-06-26 DIAGNOSIS — R195 Other fecal abnormalities: Secondary | ICD-10-CM | POA: Diagnosis not present

## 2017-06-26 DIAGNOSIS — K529 Noninfective gastroenteritis and colitis, unspecified: Secondary | ICD-10-CM | POA: Diagnosis not present

## 2017-06-26 HISTORY — PX: COLONOSCOPY: SHX174

## 2017-06-26 LAB — HM COLONOSCOPY

## 2017-06-27 DIAGNOSIS — K52832 Lymphocytic colitis: Secondary | ICD-10-CM | POA: Diagnosis not present

## 2017-07-03 ENCOUNTER — Encounter: Payer: Self-pay | Admitting: Family Medicine

## 2017-07-04 ENCOUNTER — Other Ambulatory Visit: Payer: Medicare Other

## 2017-07-05 ENCOUNTER — Other Ambulatory Visit (HOSPITAL_BASED_OUTPATIENT_CLINIC_OR_DEPARTMENT_OTHER): Payer: Medicare Other

## 2017-07-05 ENCOUNTER — Telehealth: Payer: Self-pay | Admitting: *Deleted

## 2017-07-05 ENCOUNTER — Encounter: Payer: Self-pay | Admitting: Family Medicine

## 2017-07-05 ENCOUNTER — Ambulatory Visit (INDEPENDENT_AMBULATORY_CARE_PROVIDER_SITE_OTHER): Payer: Medicare Other | Admitting: Family Medicine

## 2017-07-05 VITALS — BP 110/70 | HR 66 | Ht 70.0 in | Wt 154.8 lb

## 2017-07-05 DIAGNOSIS — Z862 Personal history of diseases of the blood and blood-forming organs and certain disorders involving the immune mechanism: Secondary | ICD-10-CM

## 2017-07-05 DIAGNOSIS — D692 Other nonthrombocytopenic purpura: Secondary | ICD-10-CM | POA: Diagnosis not present

## 2017-07-05 DIAGNOSIS — C61 Malignant neoplasm of prostate: Secondary | ICD-10-CM

## 2017-07-05 DIAGNOSIS — Z952 Presence of prosthetic heart valve: Secondary | ICD-10-CM

## 2017-07-05 DIAGNOSIS — C434 Malignant melanoma of scalp and neck: Secondary | ICD-10-CM | POA: Diagnosis not present

## 2017-07-05 DIAGNOSIS — D696 Thrombocytopenia, unspecified: Secondary | ICD-10-CM | POA: Diagnosis present

## 2017-07-05 DIAGNOSIS — Z23 Encounter for immunization: Secondary | ICD-10-CM

## 2017-07-05 DIAGNOSIS — F341 Dysthymic disorder: Secondary | ICD-10-CM

## 2017-07-05 DIAGNOSIS — I25119 Atherosclerotic heart disease of native coronary artery with unspecified angina pectoris: Secondary | ICD-10-CM

## 2017-07-05 DIAGNOSIS — E785 Hyperlipidemia, unspecified: Secondary | ICD-10-CM

## 2017-07-05 DIAGNOSIS — I209 Angina pectoris, unspecified: Secondary | ICD-10-CM | POA: Diagnosis not present

## 2017-07-05 DIAGNOSIS — D693 Immune thrombocytopenic purpura: Secondary | ICD-10-CM

## 2017-07-05 LAB — CBC WITH DIFFERENTIAL/PLATELET
BASO%: 0.7 % (ref 0.0–2.0)
BASOS ABS: 0 10*3/uL (ref 0.0–0.1)
EOS%: 3.4 % (ref 0.0–7.0)
Eosinophils Absolute: 0.2 10*3/uL (ref 0.0–0.5)
HEMATOCRIT: 40.8 % (ref 38.4–49.9)
HEMOGLOBIN: 13.8 g/dL (ref 13.0–17.1)
LYMPH#: 1.3 10*3/uL (ref 0.9–3.3)
LYMPH%: 26.1 % (ref 14.0–49.0)
MCH: 31.9 pg (ref 27.2–33.4)
MCHC: 33.9 g/dL (ref 32.0–36.0)
MCV: 94.1 fL (ref 79.3–98.0)
MONO#: 1 10*3/uL — ABNORMAL HIGH (ref 0.1–0.9)
MONO%: 19.7 % — ABNORMAL HIGH (ref 0.0–14.0)
NEUT%: 50.1 % (ref 39.0–75.0)
NEUTROS ABS: 2.4 10*3/uL (ref 1.5–6.5)
Platelets: 58 10*3/uL — ABNORMAL LOW (ref 140–400)
RBC: 4.33 10*6/uL (ref 4.20–5.82)
RDW: 12.6 % (ref 11.0–14.6)
WBC: 4.9 10*3/uL (ref 4.0–10.3)

## 2017-07-05 NOTE — Progress Notes (Signed)
Corey Oneill is a 81 y.o. male who presents for annual wellness visit and follow-up on chronic medical conditions.  He has the following concerns: He is presently being followed by oncology for his ITP which seem to be under good control with the use of steroids. He also has prostate cancer but has not seen urology in several years. He did have TAVR and does follow up with cardiology and cardiovascular surgery on a regular basis. Continues on Crestor and is having no difficulty with that. He also has a history of malignant melanoma and does see his dermatologist on a regular basis. He does work but on a much more limited basis. His home life seems to be stable. Psychologically he seems be doing well on his Cymbalta dosing.  Immunizations and Health Maintenance Immunization History  Administered Date(s) Administered  . DTaP 01/13/2009  . Influenza Split 09/01/2013  . Influenza Whole 08/16/2008  . Influenza, High Dose Seasonal PF 08/19/2015  . Influenza-Unspecified 08/13/2014  . Pneumococcal Conjugate-13 01/13/2009  . Pneumococcal Polysaccharide-23 11/20/2014  . Zoster 05/09/2007  . Zoster Recombinat (Shingrix) 02/15/2017, 04/18/2017   Health Maintenance Due  Topic Date Due  . TETANUS/TDAP  12/29/1956  . INFLUENZA VACCINE  06/13/2017    Last colonoscopy: a week ago with Dr. Earlean Shawl Last PSA: n/a Dentist: Dr. Gennaro Africa. Has an appointment Monday 07/09/17 Ophtho: [place] ophtho. Exercise:  Walk dog a mile or so  Other doctors caring for patient include: GI- Dr. Earlean Shawl Dent[facility]   Advanced Directives:Yes. Copy in the record.    Depression screen:  See questionnaire below.     Depression screen [facility] 2/9 07/05/2017 03/03/2016 09/24/2015 03/22/2015 09/29/2013  Decreased Interest 0 0 0 3 1  Down, Depressed, Hopeless 0 1 1 3 1   PHQ - 2 Score 0 1 1 6 2   Altered sleeping - - - 0 -  Tired, decreased energy - - - 3 -  Change in appetite - - - 0 -   Feeling bad or failure about yourself  - - - 3 -  Trouble concentrating - - - 0 -  Moving slowly or fidgety/restless - - - 3 -  Suicidal thoughts - - - 0 -  PHQ-9 Score - - - 15 -  Difficult doing work/chores - - - Extremely dIfficult -  Some recent data might be hidden    Fall Screen: See Questionaire below.   Fall Risk  07/05/2017 03/03/2016 03/22/2015 09/29/2013  Falls in the past year? No Yes Yes No  Comment - slipped and fell about 6 months ago. "I guess I had some slick shoes on" - -  Number falls in past yr: - 1 2 or more -  Injury with Fall? - Yes Yes -  Risk for fall due to : - History of fall(s) - -  Follow up - Education provided;Falls prevention discussed Falls prevention discussed -    ADL screen:  See questionnaire below.  Functional Status Survey: Is the patient deaf or have difficulty hearing?: No Does the patient have difficulty seeing, even when wearing glasses/contacts?: No Does the patient have difficulty concentrating, remembering, or making decisions?: No Does the patient have difficulty walking or climbing stairs?: Yes (get sob if many stairs) Does the patient have difficulty dressing or bathing?: No Does the patient have difficulty doing errands alone such as visiting a doctor's office or shopping?: No   Review of Systems  Constitutional: -, -unexpected weight change, -anorexia, -fatigue  PHYSICAL EXAM:   General Appearance: Alert, cooperative, no distress, appears stated age Head: Normocephalic, without obvious abnormality, atraumatic Eyes: PERRL, conjunctiva/corneas clear, EOM's intact, fundi benign Ears: Normal TM's and external ear canals Nose: Nares normal, mucosa normal, no drainage or sinus   tenderness Throat: Lips, mucosa, and tongue normal; teeth and gums normal Neck: Supple, no lymphadenopathy, thyroid:no enlargement/tenderness/nodules; no carotid bruit or JVD Lungs: Clear to auscultation bilaterally without wheezes, rales or ronchi;  respirations unlabored Heart: Regular rate and rhythm, S1 and S2 normal, no murmur, rub or gallop Abdomen: Soft, non-tender, nondistended, normoactive bowel sounds, no masses, no hepatosplenomegaly Extremities: No clubbing, cyanosis or edema Pulses: 2+ and symmetric all extremities Skin: Purpuric lesions noted on both arms.  Lymph nodes: Cervical, supraclavicular, and axillary nodes normal Neurologic: CNII-XII intact, normal strength, sensation and gait; reflexes 2+ and symmetric throughout   Psych: Normal mood, affect, hygiene and grooming  ASSESSMENT/PLAN: Need for influenza vaccination - Plan: Flu vaccine HIGH DOSE PF (Fluzone High dose)  S/P TAVR (transcatheter aortic valve replacement) - Plan: Comprehensive metabolic panel, Lipid panel  Coronary artery disease involving native coronary artery of native heart with angina pectoris ([place]) - Plan: Comprehensive metabolic panel, Lipid panel  Prostate cancer ([place]) - Plan: PSA  History of ITP  Senile purpura ([place])  Dysthymia  Hyperlipidemia, unspecified hyperlipidemia type - Plan: Lipid panel  Malignant melanoma of neck ([place]) - Plan: Comprehensive metabolic panel Overall he seems be doing quite nicely on his present medication regimen. I will renew his meds and follow-up on his blood work. He is to follow-up with urology, oncology, dermatology     Medicare Attestation I have personally reviewed: The patient's medical and social history Their use of alcohol, tobacco or illicit drugs Their current medications and supplements The patient's functional ability including ADLs,fall risks, home safety risks, cognitive, and hearing and visual impairment Diet and physical activities Evidence for depression or mood disorders  The patient's weight, height, and BMI have been recorded in the chart.  I have made referrals, counseling, and provided education to the patient based on review of the above and I have provided the patient with a  written personalized care plan for preventive services.     [name], MD   07/05/2017

## 2017-07-05 NOTE — Patient Instructions (Signed)
Current Outpatient Prescriptions on File Prior to Visit  Medication Sig Dispense Refill  . acetaminophen (TYLENOL) 500 MG tablet Take 500 mg by mouth every 8 (eight) hours as needed for mild pain. Reported on 11/26/2015    . aspirin EC 81 MG tablet Take 81 mg by mouth 2 (two) times daily.     . carvedilol (COREG) 3.125 MG tablet TAKE 1 TABLET TWICE DAILY. 60 tablet 10  . cyanocobalamin 2000 MCG tablet Take 2,000 mcg by mouth daily.    . DULoxetine (CYMBALTA) 30 MG capsule Take 1 capsule (30 mg total) by mouth daily. 30 capsule 5  . isosorbide mononitrate (IMDUR) 30 MG 24 hr tablet Take 15 mg by mouth daily. Take 1/2 tablet by mouth daily    . nitroGLYCERIN (NITROSTAT) 0.4 MG SL tablet Place 1 tablet (0.4 mg total) under the tongue every 5 (five) minutes as needed for chest pain. 25 tablet 3  . rosuvastatin (CRESTOR) 10 MG tablet Take 1 tablet (10 mg total) by mouth daily. 90 tablet 1   No current facility-administered medications on file prior to visit.

## 2017-07-05 NOTE — Telephone Encounter (Signed)
Per Dr. Alen Blew, I called patient to inform him that his platelets are low but still safe. No changes for now. Instructed him to call [facility] if he had any questions.

## 2017-07-05 NOTE — Telephone Encounter (Signed)
-----   Message from Wyatt Portela, MD sent at 07/05/2017  2:37 PM EDT ----- Please let him know platelets are lower but still safe. No changes for now.

## 2017-07-06 LAB — COMPREHENSIVE METABOLIC PANEL
ALT: 13 U/L (ref 9–46)
AST: 19 U/L (ref 10–35)
Albumin: 4.1 g/dL (ref 3.6–5.1)
Alkaline Phosphatase: 81 U/L (ref 40–115)
BILIRUBIN TOTAL: 0.8 mg/dL (ref 0.2–1.2)
BUN: 16 mg/dL (ref 7–25)
CHLORIDE: 100 mmol/L (ref 98–110)
CO2: 23 mmol/L (ref 20–32)
CREATININE: 0.93 mg/dL (ref 0.70–1.18)
Calcium: 8.9 mg/dL (ref 8.6–10.3)
Glucose, Bld: 82 mg/dL (ref 65–99)
Potassium: 4.3 mmol/L (ref 3.5–5.3)
SODIUM: 138 mmol/L (ref 135–146)
Total Protein: 6.8 g/dL (ref 6.1–8.1)

## 2017-07-06 LAB — LIPID PANEL
Cholesterol: 182 mg/dL (ref <200)
HDL: 61 mg/dL (ref >40)
LDL CALC: 108 mg/dL — AB (ref <100)
Total CHOL/HDL Ratio: 3 Ratio (ref <5.0)
Triglycerides: 67 mg/dL (ref <150)
VLDL: 13 mg/dL (ref <30)

## 2017-07-06 LAB — PSA: PSA: 11.4 ng/mL — AB (ref <=4.0)

## 2017-07-09 ENCOUNTER — Telehealth: Payer: Self-pay

## 2017-07-09 NOTE — Telephone Encounter (Signed)
Pt came by the office to let you know that Dr. Alen Blew did labs last week and his platelets were 58. Corey Oneill

## 2017-07-17 DIAGNOSIS — C61 Malignant neoplasm of prostate: Secondary | ICD-10-CM | POA: Diagnosis not present

## 2017-07-17 DIAGNOSIS — R350 Frequency of micturition: Secondary | ICD-10-CM | POA: Diagnosis not present

## 2017-07-18 ENCOUNTER — Other Ambulatory Visit (HOSPITAL_BASED_OUTPATIENT_CLINIC_OR_DEPARTMENT_OTHER): Payer: Medicare Other

## 2017-07-18 ENCOUNTER — Ambulatory Visit (HOSPITAL_BASED_OUTPATIENT_CLINIC_OR_DEPARTMENT_OTHER): Payer: Medicare Other | Admitting: Oncology

## 2017-07-18 VITALS — BP 119/60 | HR 65 | Temp 97.6°F | Resp 18 | Ht 70.0 in | Wt 158.6 lb

## 2017-07-18 DIAGNOSIS — Z8546 Personal history of malignant neoplasm of prostate: Secondary | ICD-10-CM

## 2017-07-18 DIAGNOSIS — Z85820 Personal history of malignant melanoma of skin: Secondary | ICD-10-CM

## 2017-07-18 DIAGNOSIS — Z79899 Other long term (current) drug therapy: Secondary | ICD-10-CM

## 2017-07-18 DIAGNOSIS — D696 Thrombocytopenia, unspecified: Secondary | ICD-10-CM | POA: Diagnosis not present

## 2017-07-18 DIAGNOSIS — Z862 Personal history of diseases of the blood and blood-forming organs and certain disorders involving the immune mechanism: Secondary | ICD-10-CM

## 2017-07-18 DIAGNOSIS — D693 Immune thrombocytopenic purpura: Secondary | ICD-10-CM

## 2017-07-18 LAB — CBC WITH DIFFERENTIAL/PLATELET
BASO%: 0.4 % (ref 0.0–2.0)
BASOS ABS: 0 10*3/uL (ref 0.0–0.1)
EOS%: 2.7 % (ref 0.0–7.0)
Eosinophils Absolute: 0.2 10*3/uL (ref 0.0–0.5)
HEMATOCRIT: 41 % (ref 38.4–49.9)
HGB: 13.9 g/dL (ref 13.0–17.1)
LYMPH%: 23.5 % (ref 14.0–49.0)
MCH: 32.2 pg (ref 27.2–33.4)
MCHC: 33.9 g/dL (ref 32.0–36.0)
MCV: 94.9 fL (ref 79.3–98.0)
MONO#: 0.8 10*3/uL (ref 0.1–0.9)
MONO%: 14.9 % — AB (ref 0.0–14.0)
NEUT#: 3.2 10*3/uL (ref 1.5–6.5)
NEUT%: 58.5 % (ref 39.0–75.0)
Platelets: 57 10*3/uL — ABNORMAL LOW (ref 140–400)
RBC: 4.32 10*6/uL (ref 4.20–5.82)
RDW: 12.5 % (ref 11.0–14.6)
WBC: 5.5 10*3/uL (ref 4.0–10.3)
lymph#: 1.3 10*3/uL (ref 0.9–3.3)

## 2017-07-18 NOTE — Progress Notes (Signed)
Hematology and Oncology Follow Up Visit  Corey Oneill [id number] Sep 10, 1936 81 y.o. 07/18/2017 2:50 PM Corey Oneill, MDLalonde, Corey Jarvis, MD   Principle Diagnosis: 81 year old gentleman with the following issues:  1. 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 This was diagnosed in December 2017 and he underwent wide excision completed on 10/24/2016. The final pathology revealed a 0.39 mm Clark's level was IV superficial spreading melanoma. No ulceration was noted with very low mitotic index. The final pathological staging was T1 pain. No lymph node sampling was done.  2. 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  3. Prostate cancer: He is status post radiation therapy in the past. He is currently receiving androgen deprivation every 6 months and at the care of Corey Oneill.  Prior Therapy:   He is status post wide excision of his melanoma in December 2017.  Prednisone therapy currently at 60 mg daily started in February 2018 and was tapered to off in May 2018.  Current therapy:   Observation and surveillance.  Lupron every 6 months under the care of Corey Oneill.  Interim History:  Corey Oneill presents today for a follow-up visit. Since the last visit, he reports feeling reasonably well without any recent issues. He was noted to have increase in his PSA and was started on Lupron by Corey Oneill. He tolerated it well without any complications at this time. He does not report any bleeding or thrombosis episodes. He denies any epistaxis, hematochezia or melena. He had any recent hospitalizations or illnesses.    He does not report any headaches, blurry vision, syncope or seizures. He does not report any fevers, chills, sweats or weight loss. He does not report any chest pain, palpitation, orthopnea or leg edema. He does not report any cough, wheezing or hemoptysis. He does not  report any nausea, vomiting or abdominal pain. He does not report any frequency urgency or hesitancy. He does not report any skeletal complaints. Remaining review of systems unremarkable.   Medications: I have reviewed the patient's current medications.  Current Outpatient Prescriptions  Medication Sig Dispense Refill  . acetaminophen (TYLENOL) 500 MG tablet Take 500 mg by mouth every 8 (eight) hours as needed for mild pain. Reported on 11/26/2015    . aspirin EC 81 MG tablet Take 81 mg by mouth 2 (two) times daily.     . carvedilol (COREG) 3.125 MG tablet TAKE 1 TABLET TWICE DAILY. 60 tablet 10  . cyanocobalamin 2000 MCG tablet Take 2,000 mcg by mouth daily.    . DULoxetine (CYMBALTA) 30 MG capsule Take 1 capsule (30 mg total) by mouth daily. 30 capsule 5  . isosorbide mononitrate (IMDUR) 30 MG 24 hr tablet Take 15 mg by mouth daily. Take 1/2 tablet by mouth daily    . MAGNESIUM CITRATE PO Take 2,500 Units by mouth.    . nitroGLYCERIN (NITROSTAT) 0.4 MG SL tablet Place 1 tablet (0.4 mg total) under the tongue every 5 (five) minutes as needed for chest pain. 25 tablet 3  . rosuvastatin (CRESTOR) 10 MG tablet Take 1 tablet (10 mg total) by mouth daily. 90 tablet 1   No current facility-administered medications for this visit.      Allergies: No Known Allergies  Past Medical History, Surgical history, Social history, and Family History were reviewed and updated.  Physical Exam: Blood pressure 119/60, pulse 65, temperature 97.6 F (36.4 C), temperature  source Oral, resp. rate 18, height 5\' 10"  (1.778 m), weight 158 lb 9.6 oz (71.9 kg), SpO2 97 %. ECOG: 0 General appearance: Well-appearing gentleman without distress. Head: Normocephalic, without obvious abnormality no oral thrush or ulcers. Neck: no adenopathy or masses. Lymph nodes: Cervical, supraclavicular, and axillary nodes normal. Heart:regular rate and rhythm, S1, S2 normal, no murmur, click, rub or gallop Oneill:chest clear, no  wheezing, rales, normal symmetric air entry Abdomin: soft, non-tender, without masses or organomegaly no shifting dullness or ascites. EXT:no erythema, induration, or nodules   Lab Results: Lab Results  Component Value Date   WBC 5.5 07/18/2017   HGB 13.9 07/18/2017   HCT 41.0 07/18/2017   MCV 94.9 07/18/2017   PLT 57 (L) 07/18/2017     Chemistry      Component Value Date/Time   NA 138 07/05/2017 0957   NA 141 12/25/2016 1419   K 4.3 07/05/2017 0957   CL 100 07/05/2017 0957   CO2 23 07/05/2017 0957   BUN 16 07/05/2017 0957   BUN 13 12/25/2016 1419   CREATININE 0.93 07/05/2017 0957      Component Value Date/Time   CALCIUM 8.9 07/05/2017 0957   ALKPHOS 81 07/05/2017 0957   AST 19 07/05/2017 0957   ALT 13 07/05/2017 0957   BILITOT 0.8 07/05/2017 0957       Impression and Plan:  81 year old gentleman with the following issues:  1. 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 This was diagnosed in December 2017 and he underwent wide excision completed on 10/24/2016. The final pathology revealed a 0.39 mm Clark's level was IV superficial spreading melanoma. No ulceration was noted with very low mitotic index. The final pathological staging was T1 pain. No lymph node sampling was done.  He will continue on active surveillance at this time without any evidence of recurrent disease.  2. Thrombocytopenia: Diagnosed in February 2018 with a platelet count around 62,000. He has a normal CBC otherwise with a normal differential and a normal peripheral smear. These findings suggest ITP.  He had a complete response to prednisone with a platelet count of 179 on 03/28/2017.   He is currently off steroids and platelet count drifting slowly. He is no active bleeding and platelet count above 50,000. I recommended continued observation and surveillance and use IVIG or rituximab if his platelet counts continues to drop or he develops any bleeding.  He is currently  on aspirin and has been on Plavix in the past. He will ask Corey Oneill whether he needs to restart Plavix at this time given his platelets are 57.  3. Prostate cancer: He is currently under the care of Corey Oneill currently receiving Lupron.   4. Follow-up: He will receive labs every 3 weeks on file M.D. follow-up in November 2016.   Zola Button, MD 9/5/20182:50 PM

## 2017-07-19 ENCOUNTER — Telehealth: Payer: Self-pay

## 2017-07-19 NOTE — Telephone Encounter (Signed)
Spoke with patient concerning upcoming appointments for October, November  per los.

## 2017-07-24 DIAGNOSIS — D229 Melanocytic nevi, unspecified: Secondary | ICD-10-CM | POA: Diagnosis not present

## 2017-07-24 DIAGNOSIS — L910 Hypertrophic scar: Secondary | ICD-10-CM | POA: Diagnosis not present

## 2017-07-24 DIAGNOSIS — L821 Other seborrheic keratosis: Secondary | ICD-10-CM | POA: Diagnosis not present

## 2017-07-24 DIAGNOSIS — L570 Actinic keratosis: Secondary | ICD-10-CM | POA: Diagnosis not present

## 2017-07-24 DIAGNOSIS — D1801 Hemangioma of skin and subcutaneous tissue: Secondary | ICD-10-CM | POA: Diagnosis not present

## 2017-08-08 ENCOUNTER — Telehealth: Payer: Self-pay | Admitting: *Deleted

## 2017-08-08 ENCOUNTER — Other Ambulatory Visit (HOSPITAL_BASED_OUTPATIENT_CLINIC_OR_DEPARTMENT_OTHER): Payer: Medicare Other

## 2017-08-08 DIAGNOSIS — D696 Thrombocytopenia, unspecified: Secondary | ICD-10-CM | POA: Diagnosis present

## 2017-08-08 DIAGNOSIS — Z862 Personal history of diseases of the blood and blood-forming organs and certain disorders involving the immune mechanism: Secondary | ICD-10-CM

## 2017-08-08 LAB — CBC WITH DIFFERENTIAL/PLATELET
BASO%: 0.4 % (ref 0.0–2.0)
Basophils Absolute: 0 10*3/uL (ref 0.0–0.1)
EOS ABS: 0.1 10*3/uL (ref 0.0–0.5)
EOS%: 2.8 % (ref 0.0–7.0)
HCT: 39.7 % (ref 38.4–49.9)
HEMOGLOBIN: 13.5 g/dL (ref 13.0–17.1)
LYMPH%: 29.4 % (ref 14.0–49.0)
MCH: 32 pg (ref 27.2–33.4)
MCHC: 34 g/dL (ref 32.0–36.0)
MCV: 94.1 fL (ref 79.3–98.0)
MONO#: 0.7 10*3/uL (ref 0.1–0.9)
MONO%: 13.5 % (ref 0.0–14.0)
NEUT%: 53.9 % (ref 39.0–75.0)
NEUTROS ABS: 2.7 10*3/uL (ref 1.5–6.5)
Platelets: 50 10*3/uL — ABNORMAL LOW (ref 140–400)
RBC: 4.22 10*6/uL (ref 4.20–5.82)
RDW: 12.9 % (ref 11.0–14.6)
WBC: 5 10*3/uL (ref 4.0–10.3)
lymph#: 1.5 10*3/uL (ref 0.9–3.3)

## 2017-08-08 NOTE — Telephone Encounter (Signed)
Spoke with patient gave results of last CBC

## 2017-08-08 NOTE — Telephone Encounter (Signed)
-----   Message from Wyatt Portela, MD sent at 08/08/2017  1:00 PM EDT ----- Please let him know his platelets show little change.

## 2017-08-08 NOTE — Telephone Encounter (Signed)
Lm on answering machine to call me. Re: plts

## 2017-08-14 DIAGNOSIS — K045 Chronic apical periodontitis: Secondary | ICD-10-CM | POA: Diagnosis not present

## 2017-08-21 DIAGNOSIS — K045 Chronic apical periodontitis: Secondary | ICD-10-CM | POA: Diagnosis not present

## 2017-08-24 DIAGNOSIS — Z23 Encounter for immunization: Secondary | ICD-10-CM | POA: Diagnosis not present

## 2017-08-29 ENCOUNTER — Other Ambulatory Visit: Payer: Medicare Other

## 2017-09-18 ENCOUNTER — Other Ambulatory Visit: Payer: Medicare Other

## 2017-09-18 ENCOUNTER — Ambulatory Visit: Payer: Medicare Other | Admitting: Oncology

## 2017-09-19 ENCOUNTER — Telehealth: Payer: Self-pay | Admitting: Cardiovascular Disease

## 2017-09-19 NOTE — Telephone Encounter (Signed)
Patient reports intermittent chest discomfort for several weeks. He says the discomfort is not painful, he can "just tell something is there." He states the discomfort is at rest mostly and when he lies flat, like when he goes to sleep. He just walked his dog and experienced no discomfort at all.  He took 1 NTG yesterday and the discomfort subsided. He states "the discomfort always goes away with just 1 Nitro." He denies CP, SOB, sweating, dizziness, nausea. He is taking his medications as directed. Scheduled patient for evaluation with Kathleen Argue this Friday. He understands to seek immediate medical attention if symptoms worsen prior to appointment. He was grateful for call and agrees with treatment plan.

## 2017-09-19 NOTE — Telephone Encounter (Signed)
New message    Patient calling to report weakness and feels a little unsteady when walking.  No breathing issues or concerns. No BP or HR concerns.    Pt c/o of Chest Pain: STAT if CP now or developed within 24 hours  1. Are you having CP right now?  NO  2. Are you experiencing any other symptoms (ex. SOB, nausea, vomiting, sweating)? Sweating  3. How long have you been experiencing CP?  Several weeks  4. Is your CP continuous or coming and going?  Coming and going  5. Have you taken Nitroglycerin?  Yes on 11/6 ?

## 2017-09-20 ENCOUNTER — Encounter: Payer: Self-pay | Admitting: Physician Assistant

## 2017-09-20 NOTE — Progress Notes (Signed)
Cardiology Office Note:    Date:  09/21/2017   ID:  Corey Oneill, DOB Mar 25, 1936, MRN [id number]  PCP:  [name], MD  Cardiologist:  [name], MD    Referring MD: [name], MD   Chief Complaint  Patient presents with  . Chest Pain    History of Present Illness:    Corey Oneill is a 81 y.o. male with a hx of CAD status post prior PCI to the RCA, aortic stenosis status post TAVR in April 2017.  He was last seen by Dr. Burt Knack in May 2018.  He had recently been to the [facility] and was evaluated in the emergency room for chest pain.  He called in recently with complaints of chest discomfort.  Corey Oneill returns for evaluation of chest discomfort.  He notes epigastric discomfort with activity that is been present for a couple of years now without significant change.  However, over the past 6-8 weeks he has noted a lack of energy.  He has also noted chest discomfort at night after lying down.  This may happen a couple of times a week and he has taken nitroglycerin with relief.  He has only had to take 1 nitroglycerin each time.  He denies any associated shortness of breath or radiating symptoms or associated nausea.  He has noted that he sweats more recently.  He denies orthopnea, PND or significant edema.  He denies syncope or near syncope.  He did have some blood in his stool about 2 months ago but this is resolved.  He denies melena or hematuria.  Prior CV studies:   The following studies were reviewed today:  Echo 02/22/17 EF 60-65, normal wall motion, grade 1 diastolic dysfunction, status post TAVR without obstruction or perivalvular leak (mean 10), MAC, trivial MR  Cardiac Catheterization 12/26/16 LAD mid 20 RCA proximal 35, mid 30, distal RCA stent patent  Nuclear stress test 12/25/16 EF 67, inferoseptal/inferolateral/apical lateral nonreversible defect, inferior/inferolateral, apical inferior ischemia; high risk   Past Medical History:  Diagnosis Date    . Anginal pain ([place])   . Aortic stenosis    a. peak to peak gradient by LHC 8/16:  37 mmHg (moderately severe)   . CAD (coronary artery disease)    a.  LHC 8/16: Mid to distal LAD 30%, OM1 40%, proximal mid RCA 40%, distal RCA 60% >> FFR 0.69  >> PCI: 3 x 15 mm Resolute DES  . Depression   . Esophageal reflux   . Family history of adverse reaction to anesthesia    "daughter has PONV"  . H/O blood clots    "get them in my stool and urine" (11/12/2015)  . Heart murmur   . Hx of radiation therapy 04/14/13-06/09/13   prostate 7800 cGy, 40 sessions, seminal vesicles 5600 cGy 40 sessions  . Hyperlipidemia   . Malignant melanoma in situ ([place]) 09/04/2016   Right neck and chest  . Prostate cancer ([place]) dx'd 2014  . Radiation proctitis     Past Surgical History:  Procedure Laterality Date  . COLONOSCOPY    . FRACTURE SURGERY    . INGUINAL HERNIA REPAIR    . NASAL FRACTURE SURGERY     "broken years ago; several ORs to correct it"  . NASAL SEPTUM SURGERY    . PROSTATE BIOPSY  2014   "needle biopsy"  . TONSILLECTOMY    . TRANSCATHETER AORTIC VALVE REPLACEMENT, TRANSFEMORAL  02/15/2016    Current Medications: Current Meds  Medication Sig  . acetaminophen (TYLENOL) 500 MG tablet Take 500 mg by mouth every 8 (eight) hours as needed for mild pain. Reported on 11/26/2015  . carvedilol (COREG) 3.125 MG tablet TAKE 1 TABLET TWICE DAILY.  . cyanocobalamin 2000 MCG tablet Take 2,000 mcg by mouth daily.  . DULoxetine (CYMBALTA) 30 MG capsule Take 1 capsule (30 mg total) by mouth daily.  . isosorbide mononitrate (IMDUR) 30 MG 24 hr tablet Take 1 tablet (30 mg total) daily by mouth. Take 1/2 tablet by mouth daily  . MAGNESIUM CITRATE PO Take 2,500 Units by mouth.  . nitroGLYCERIN (NITROSTAT) 0.4 MG SL tablet Place 1 tablet (0.4 mg total) under the tongue every 5 (five) minutes as needed for chest pain.  . rosuvastatin (CRESTOR) 10 MG tablet Take 1 tablet (10 mg total) by mouth daily.  .  [DISCONTINUED] aspirin EC 81 MG tablet Take 81 mg by mouth 2 (two) times daily.   . [DISCONTINUED] isosorbide mononitrate (IMDUR) 30 MG 24 hr tablet Take 15 mg by mouth daily. Take 1/2 tablet by mouth daily     Allergies:   Patient has no known allergies.   Social History   Tobacco Use  . Smoking status: Former Smoker    Packs/day: 0.00    Types: Cigarettes  . Smokeless tobacco: Never Used  . Tobacco comment: "quit smoking cigarettes in the 1960s; quit smoking cigars in the 1990s  Substance Use Topics  . Alcohol use: Yes    Comment: 2 DRINKS PER DAY  . Drug use: No     Family Hx: The patient's family history includes Brain cancer in his father; Depression in his daughter; Lymphoma in his mother.  ROS:   Please see the history of present illness.    Review of Systems  Cardiovascular: Positive for chest pain.  Psychiatric/Behavioral: Positive for depression.   All other systems reviewed and are negative.   EKGs/Labs/Other Test Reviewed:    EKG:  EKG is  ordered today.  The ekg ordered today demonstrates normal sinus rhythm, heart rate 81 QTC 466 ms  Recent Labs: 07/05/2017: ALT 13; BUN 16; Creat 0.93; Potassium 4.3; Sodium 138 08/08/2017: HGB 13.5; Platelets 50   Recent Lipid Panel Lab Results  Component Value Date/Time   CHOL 182 07/05/2017 09:57 AM   TRIG 67 07/05/2017 09:57 AM   HDL 61 07/05/2017 09:57 AM   CHOLHDL 3.0 07/05/2017 09:57 AM   LDLCALC 108 (H) 07/05/2017 09:57 AM    Physical Exam:    VS:  BP 130/62   Pulse 80   Ht _0  (1.778 m)   Wt 155 lb 12.8 oz (70.7 kg)   SpO2 97%   BMI 22.35 kg/m     Wt Readings from Last 3 Encounters:  09/21/17 155 lb 12.8 oz (70.7 kg)  07/18/17 158 lb 9.6 oz (71.9 kg)  07/05/17 154 lb 12.8 oz (70.2 kg)     Physical Exam  Constitutional: He is oriented to person, place, and time. He appears well-developed and well-nourished. No distress.  HENT:  Head: Normocephalic and atraumatic.  Eyes: No scleral icterus.    Neck: No JVD present.  Cardiovascular: Normal rate and regular rhythm.  No murmur heard. Pulmonary/Chest: Effort normal. He has no rales.  Abdominal: Soft.  Musculoskeletal: He exhibits no edema.  Neurological: He is alert and oriented to person, place, and time.  Skin: Skin is warm and dry.    ASSESSMENT:    1. Coronary artery disease involving native coronary artery  of native heart with angina pectoris ([place])   2. Hyperlipidemia, unspecified hyperlipidemia type   3. S/P TAVR (transcatheter aortic valve replacement)   4. Other fatigue    PLAN:    In order of problems listed above:  1.  Coronary artery disease involving native coronary artery of native heart with angina pectoris ([place])  History of prior stenting to the RCA.  Cardiac catheterization in February 2018 demonstrated patent stent and mild nonobstructive disease elsewhere.  He has been on isosorbide since earlier this year.  He does have chronic exertional symptoms that have been fairly stable.  Recently, he has noted more symptoms at night when he lies down.  His stress test prior to his cardiac catheterization in February was high risk.  Therefore, I do not think a repeat stress test is going to be helpful.  His ECG is unchanged without ischemic changes.  I have recommended adjusting medical therapy with close follow-up.  Given his history of fatigue and known thrombocytopenia, I have recommended ruling out anemia.  -Increase isosorbide to 30 mg daily  -Add Pepcid 20 mg every evening  -Obtain BMET, CBC (labs to be drawn at [facility] today)  -Continue aspirin, beta-blocker, statin  -Follow-up with Dr. Burt Knack 11/28  2.  Hyperlipidemia, unspecified hyperlipidemia type  Continue statin.  3.  S/P TAVR (transcatheter aortic valve replacement)  Echocardiogram in April 2018 with well functioning aortic valve prosthesis.  Change aspirin to 81 mg daily.  Continue SBE prophylaxis.  4.  Other fatigue Obtain BMET, CBC to rule  out anemia as a cause of his symptoms.   Dispo:  Return in 3 weeks (on 10/10/2017) for Scheduled Follow Up, w/ Dr. Burt Knack.   Medication Adjustments/Labs and Tests Ordered: Current medicines are reviewed at length with the patient today.  Concerns regarding medicines are outlined above.  Tests Ordered: Orders Placed This Encounter  Procedures  . Basic Metabolic Panel (BMET)  . EKG 12-Lead   Medication Changes: Meds ordered this encounter  Medications  . isosorbide mononitrate (IMDUR) 30 MG 24 hr tablet    Sig: Take 1 tablet (30 mg total) daily by mouth. Take 1/2 tablet by mouth daily    Dispense:  30 tablet    Refill:  11  . famotidine (PEPCID) 20 MG tablet    Sig: Take 1 tablet (20 mg total) at bedtime by mouth.    Dispense:  30 tablet    Refill:  11  . aspirin EC 81 MG tablet    Sig: Take 1 tablet (81 mg total) daily by mouth.    Dispense:  90 tablet    Refill:  3    Order Specific Question:   Supervising Provider    Answer:   Lauree Chandler D [[id number]]    Signed, Richardson Dopp, PA-C  09/21/2017 9:51 AM    [facility] [address], [place], [place]  [address] Phone: [phone]; Fax: [phone]

## 2017-09-21 ENCOUNTER — Ambulatory Visit (HOSPITAL_BASED_OUTPATIENT_CLINIC_OR_DEPARTMENT_OTHER): Payer: Medicare Other

## 2017-09-21 ENCOUNTER — Encounter: Payer: Self-pay | Admitting: Physician Assistant

## 2017-09-21 ENCOUNTER — Telehealth: Payer: Self-pay | Admitting: Oncology

## 2017-09-21 ENCOUNTER — Ambulatory Visit (INDEPENDENT_AMBULATORY_CARE_PROVIDER_SITE_OTHER): Payer: Medicare Other | Admitting: Physician Assistant

## 2017-09-21 ENCOUNTER — Encounter: Payer: Self-pay | Admitting: *Deleted

## 2017-09-21 ENCOUNTER — Telehealth: Payer: Self-pay | Admitting: Family Medicine

## 2017-09-21 ENCOUNTER — Other Ambulatory Visit: Payer: Self-pay | Admitting: Oncology

## 2017-09-21 VITALS — BP 130/62 | HR 80 | Ht 70.0 in | Wt 155.8 lb

## 2017-09-21 DIAGNOSIS — E785 Hyperlipidemia, unspecified: Secondary | ICD-10-CM | POA: Diagnosis not present

## 2017-09-21 DIAGNOSIS — I25119 Atherosclerotic heart disease of native coronary artery with unspecified angina pectoris: Secondary | ICD-10-CM | POA: Diagnosis not present

## 2017-09-21 DIAGNOSIS — R5383 Other fatigue: Secondary | ICD-10-CM | POA: Diagnosis not present

## 2017-09-21 DIAGNOSIS — D696 Thrombocytopenia, unspecified: Secondary | ICD-10-CM | POA: Diagnosis present

## 2017-09-21 DIAGNOSIS — Z952 Presence of prosthetic heart valve: Secondary | ICD-10-CM | POA: Diagnosis not present

## 2017-09-21 DIAGNOSIS — Z862 Personal history of diseases of the blood and blood-forming organs and certain disorders involving the immune mechanism: Secondary | ICD-10-CM

## 2017-09-21 DIAGNOSIS — I209 Angina pectoris, unspecified: Secondary | ICD-10-CM

## 2017-09-21 DIAGNOSIS — C61 Malignant neoplasm of prostate: Secondary | ICD-10-CM

## 2017-09-21 LAB — COMPREHENSIVE METABOLIC PANEL
ALT: 16 U/L (ref 0–55)
AST: 22 U/L (ref 5–34)
Albumin: 3.7 g/dL (ref 3.5–5.0)
Alkaline Phosphatase: 79 U/L (ref 40–150)
Anion Gap: 7 mEq/L (ref 3–11)
BUN: 18.7 mg/dL (ref 7.0–26.0)
CO2: 29 meq/L (ref 22–29)
Calcium: 8.7 mg/dL (ref 8.4–10.4)
Chloride: 105 mEq/L (ref 98–109)
Creatinine: 0.9 mg/dL (ref 0.7–1.3)
GLUCOSE: 90 mg/dL (ref 70–140)
POTASSIUM: 4.4 meq/L (ref 3.5–5.1)
SODIUM: 141 meq/L (ref 136–145)
Total Bilirubin: 0.7 mg/dL (ref 0.20–1.20)
Total Protein: 7.2 g/dL (ref 6.4–8.3)

## 2017-09-21 LAB — CBC WITH DIFFERENTIAL/PLATELET
BASO%: 0.5 % (ref 0.0–2.0)
BASOS ABS: 0 10*3/uL (ref 0.0–0.1)
EOS%: 3 % (ref 0.0–7.0)
Eosinophils Absolute: 0.1 10*3/uL (ref 0.0–0.5)
HEMATOCRIT: 38.2 % — AB (ref 38.4–49.9)
HEMOGLOBIN: 12.8 g/dL — AB (ref 13.0–17.1)
LYMPH#: 0.9 10*3/uL (ref 0.9–3.3)
LYMPH%: 28.1 % (ref 14.0–49.0)
MCH: 31.8 pg (ref 27.2–33.4)
MCHC: 33.5 g/dL (ref 32.0–36.0)
MCV: 94.9 fL (ref 79.3–98.0)
MONO#: 0.4 10*3/uL (ref 0.1–0.9)
MONO%: 13.2 % (ref 0.0–14.0)
NEUT#: 1.7 10*3/uL (ref 1.5–6.5)
NEUT%: 55.2 % (ref 39.0–75.0)
PLATELETS: 57 10*3/uL — AB (ref 140–400)
RBC: 4.03 10*6/uL — ABNORMAL LOW (ref 4.20–5.82)
RDW: 13.5 % (ref 11.0–14.6)
WBC: 3.1 10*3/uL — ABNORMAL LOW (ref 4.0–10.3)

## 2017-09-21 MED ORDER — FAMOTIDINE 20 MG PO TABS: 20.0000 mg | ORAL_TABLET | Freq: Every day | ORAL | 11 refills | Status: DC

## 2017-09-21 MED ORDER — ISOSORBIDE MONONITRATE ER 30 MG PO TB24: 30.0000 mg | ORAL_TABLET | Freq: Every day | ORAL | 11 refills | Status: DC

## 2017-09-21 MED ORDER — ASPIRIN EC 81 MG PO TBEC: 81.0000 mg | DELAYED_RELEASE_TABLET | Freq: Every day | ORAL | 3 refills | Status: DC

## 2017-09-21 MED ORDER — DULOXETINE HCL 30 MG PO CPEP: 30.0000 mg | ORAL_CAPSULE | Freq: Every day | ORAL | 1 refills | Status: DC

## 2017-09-21 MED ORDER — ROSUVASTATIN CALCIUM 10 MG PO TABS: 10.0000 mg | ORAL_TABLET | Freq: Every day | ORAL | 1 refills | Status: DC

## 2017-09-21 NOTE — Telephone Encounter (Signed)
Pt aware to continue these medications.Corey Oneill

## 2017-09-21 NOTE — Telephone Encounter (Signed)
A prescription was called in

## 2017-09-21 NOTE — Patient Instructions (Addendum)
Medication Instructions:  Your physician has recommended you make the following change in your medication:  1. Increase imdur to (30 mg ) daily, sent in today to patient's requested pharmacy. 2. Start Pepcid, OTC (20 mg) daily at bedtime.  3. Decrease Asprin ( 81 mg ) daily.  Labwork: Order given to patient today to get bmet drawn at [facility] today with results faxed to Richardson Dopp, PA-C @ [phone].  Testing/Procedures: -None  Follow-Up: Your physician recommends that you keep your  scheduled  follow-up appointment with Dr. Burt Knack later this month.   Any Other Special Instructions Will Be Listed Below (If Applicable).     If you need a refill on your cardiac medications before your next appointment, please call your pharmacy.

## 2017-09-21 NOTE — Telephone Encounter (Signed)
Pt called and stated he is running out of Duloxetine and rosuvastatin. He would like to know it he is to continue and if so he needs refills sent to [facility]. Pt can be reached at [phone].

## 2017-09-21 NOTE — Telephone Encounter (Signed)
R/s missed appt from 11/6 - per Dr. Alen Blew okay with add on to 11/12 as long as it is at 8:30 - patient is aware of appt date and time - gave patient calender .

## 2017-09-24 ENCOUNTER — Ambulatory Visit (HOSPITAL_BASED_OUTPATIENT_CLINIC_OR_DEPARTMENT_OTHER): Payer: Medicare Other | Admitting: Oncology

## 2017-09-24 ENCOUNTER — Telehealth: Payer: Self-pay | Admitting: Oncology

## 2017-09-24 VITALS — BP 114/59 | HR 65 | Temp 97.9°F | Resp 18 | Ht 70.0 in | Wt 159.3 lb

## 2017-09-24 DIAGNOSIS — D696 Thrombocytopenia, unspecified: Secondary | ICD-10-CM

## 2017-09-24 DIAGNOSIS — C61 Malignant neoplasm of prostate: Secondary | ICD-10-CM | POA: Diagnosis not present

## 2017-09-24 DIAGNOSIS — Z85820 Personal history of malignant melanoma of skin: Secondary | ICD-10-CM

## 2017-09-24 DIAGNOSIS — Z862 Personal history of diseases of the blood and blood-forming organs and certain disorders involving the immune mechanism: Secondary | ICD-10-CM

## 2017-09-24 NOTE — Progress Notes (Signed)
Hematology and Oncology Follow Up Visit  Corey Oneill [id number] 10-May-1936 81 y.o. 09/24/2017 9:06 AM Corey Oneill, MDLalonde, [name], MD   Principle Diagnosis: 81 year old gentleman with the following issues:  1. 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 This was diagnosed in December 2017 and he underwent wide excision completed on 10/24/2016. The final pathology revealed a 0.39 mm Clark's level was IV superficial spreading melanoma. No ulceration was noted with very low mitotic index. The final pathological staging was T1 pain. No lymph node sampling was done.  2. 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  3. Prostate cancer: He is status post radiation therapy in the past. He is currently receiving androgen deprivation every 6 months and at the care of Dr. Junious Oneill.  Prior Therapy:   He is status post wide excision of his melanoma in December 2017.  Prednisone therapy currently at 60 mg daily started in February 2018 and was tapered to off in May 2018.  Current therapy:   Observation and surveillance.  Lupron every 6 months under the care of Dr. Junious Oneill.  Interim History:  Corey Oneill presents today for a follow-up visit. Since the last visit, he reports no changes in his health.  He did report one episode of hematuria earlier this morning.  He denies any abdominal pain, flank pain or change in his performance status.  He denies any epistaxis, hematochezia or melena. He had any recent hospitalizations or illnesses.  He denies any chest pain or cardiac complications.  He remains active and attends to activities of daily living.   He does not report any headaches, blurry vision, syncope or seizures. He does not report any fevers, chills, sweats or weight loss. He does not report any chest pain, palpitation, orthopnea or leg edema. He does not report any cough,  wheezing or hemoptysis. He does not report any nausea, vomiting or abdominal pain. He does not report any frequency urgency or hesitancy. He does not report any skeletal complaints. Remaining review of systems unremarkable.   Medications: I have reviewed the patient's current medications.  Current Outpatient Medications  Medication Sig Dispense Refill  . acetaminophen (TYLENOL) 500 MG tablet Take 500 mg by mouth every 8 (eight) hours as needed for mild pain. Reported on 11/26/2015    . aspirin EC 81 MG tablet Take 1 tablet (81 mg total) daily by mouth. 90 tablet 3  . carvedilol (COREG) 3.125 MG tablet TAKE 1 TABLET TWICE DAILY. 60 tablet 10  . cyanocobalamin 2000 MCG tablet Take 2,000 mcg by mouth daily.    . DULoxetine (CYMBALTA) 30 MG capsule Take 1 capsule (30 mg total) daily by mouth. 30 capsule 1  . famotidine (PEPCID) 20 MG tablet Take 1 tablet (20 mg total) at bedtime by mouth. 30 tablet 11  . isosorbide mononitrate (IMDUR) 30 MG 24 hr tablet Take 1 tablet (30 mg total) daily by mouth. Take 1/2 tablet by mouth daily 30 tablet 11  . MAGNESIUM CITRATE PO Take 2,500 Units by mouth.    . nitroGLYCERIN (NITROSTAT) 0.4 MG SL tablet Place 1 tablet (0.4 mg total) under the tongue every 5 (five) minutes as needed for chest pain. 25 tablet 3  . rosuvastatin (CRESTOR) 10 MG tablet Take 1 tablet (10 mg total) daily by mouth. 90 tablet 1   No current facility-administered medications for this visit.      Allergies: No  Known Allergies  Past Medical History, Surgical history, Social history, and Family History were reviewed and updated.  Physical Exam: Blood pressure (!) 114/59, pulse 65, temperature 97.9 F (36.6 C), temperature source Oral, resp. rate 18, height 5\' 10"  (1.778 m), weight 159 lb 4.8 oz (72.3 kg), SpO2 99 %. ECOG: 0 General appearance: Alert, awake gentleman without distress. Head: Normocephalic, without obvious abnormality no oral ulcers or lesions. Neck: no adenopathy or  masses. Lymph nodes: Cervical, supraclavicular, and axillary nodes normal. Heart:regular rate and rhythm, S1, S2 normal, no murmur, click, rub or gallop Oneill:chest clear, no wheezing, rales, normal symmetric air entry Abdomin: soft, non-tender, without masses or organomegaly no rebound or guarding. EXT:no erythema, induration, or nodules   Lab Results: Lab Results  Component Value Date   WBC 3.1 (L) 09/21/2017   HGB 12.8 (L) 09/21/2017   HCT 38.2 (L) 09/21/2017   MCV 94.9 09/21/2017   PLT 57 (L) 09/21/2017     Chemistry      Component Value Date/Time   NA 141 09/21/2017 1101   K 4.4 09/21/2017 1101   CL 100 07/05/2017 0957   CO2 29 09/21/2017 1101   BUN 18.7 09/21/2017 1101   CREATININE 0.9 09/21/2017 1101      Component Value Date/Time   CALCIUM 8.7 09/21/2017 1101   ALKPHOS 79 09/21/2017 1101   AST 22 09/21/2017 1101   ALT 16 09/21/2017 1101   BILITOT 0.70 09/21/2017 1101       Impression and Plan:  81 year old gentleman with the following issues:  1. 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 This was diagnosed in December 2017 and he underwent wide excision completed on 10/24/2016. The final pathology revealed a 0.39 mm Clark's level was IV superficial spreading melanoma. No ulceration was noted with very low mitotic index. The final pathological staging was T1 pain. No lymph node sampling was done.  He will continue on active surveillance at this time without any evidence of recurrent disease.  2. Thrombocytopenia: Diagnosed in February 2018 with a platelet count around 62,000. He has a normal CBC otherwise with a normal differential and a normal peripheral smear. These findings suggest ITP.  He had a complete response to prednisone with a platelet count of 179 on 03/28/2017.   He is currently off steroids and platelet count remained relatively stable above 50,000.  As long as his platelet count continues to be around that range no  other intervention is needed.  IVIG and rituximab can be used in the future if needed to.   3. Prostate cancer: He is currently under the care of Dr. Junious Oneill currently receiving Lupron.  I asked him to report his hematuria to Dr. Junious Oneill of that happen again.   4. Follow-up: He will follow-up in 6 weeks.   Zola Button, MD 11/12/20189:06 AM

## 2017-09-24 NOTE — Telephone Encounter (Signed)
Gave avs patient declined calendar for December

## 2017-09-27 DIAGNOSIS — R011 Cardiac murmur, unspecified: Secondary | ICD-10-CM | POA: Insufficient documentation

## 2017-09-27 DIAGNOSIS — I209 Angina pectoris, unspecified: Secondary | ICD-10-CM | POA: Insufficient documentation

## 2017-09-27 DIAGNOSIS — Z8489 Family history of other specified conditions: Secondary | ICD-10-CM | POA: Insufficient documentation

## 2017-09-27 DIAGNOSIS — I2510 Atherosclerotic heart disease of native coronary artery without angina pectoris: Secondary | ICD-10-CM | POA: Insufficient documentation

## 2017-09-27 DIAGNOSIS — I350 Nonrheumatic aortic (valve) stenosis: Secondary | ICD-10-CM | POA: Insufficient documentation

## 2017-09-27 DIAGNOSIS — Z86718 Personal history of other venous thrombosis and embolism: Secondary | ICD-10-CM | POA: Insufficient documentation

## 2017-09-27 DIAGNOSIS — K627 Radiation proctitis: Secondary | ICD-10-CM | POA: Insufficient documentation

## 2017-09-27 DIAGNOSIS — Z923 Personal history of irradiation: Secondary | ICD-10-CM | POA: Insufficient documentation

## 2017-10-02 ENCOUNTER — Ambulatory Visit (INDEPENDENT_AMBULATORY_CARE_PROVIDER_SITE_OTHER): Payer: Medicare Other | Admitting: Family Medicine

## 2017-10-02 ENCOUNTER — Encounter: Payer: Self-pay | Admitting: Family Medicine

## 2017-10-02 VITALS — BP 110/66 | HR 76 | Temp 98.8°F | Wt 155.0 lb

## 2017-10-02 DIAGNOSIS — J069 Acute upper respiratory infection, unspecified: Secondary | ICD-10-CM | POA: Diagnosis not present

## 2017-10-02 DIAGNOSIS — I25119 Atherosclerotic heart disease of native coronary artery with unspecified angina pectoris: Secondary | ICD-10-CM

## 2017-10-02 NOTE — Progress Notes (Signed)
Chief Complaint  Patient presents with  . cold and congestion    cold , congestion , feeling tried, having trouble with gaitx 1 month    Subjective:  Corey Oneill is a 81 y.o. male who presents for a 1 week history of sore throat, hoarse voice and dry cough. Stats his symptoms are gradually improving.  Reports associated symptoms of fatigue but this is not new.   Denies fever, ear pain, rhinorrhea, nasal congestion, chest pain, palpitations, shortness of breath, N/V/D.   Reports having nocturia, getting up every 2 hours at night. This is ongoing for several years. He is under the care of a urologist.   He did get his flu shot.   Treatment to date: none.  Denies sick contacts.  No other aggravating or relieving factors.  No other c/o.  ROS as in subjective.   Objective: Vitals:   10/02/17 1624  BP: 110/66  Pulse: 76  Temp: 98.8 F (37.1 C)  SpO2: 97%    General appearance: Alert, WD/WN, no distress, mildly ill appearing                             Skin: warm, no rash                           Head: no sinus tenderness                            Eyes: conjunctiva normal, corneas clear, PERRLA                            Ears: pearly TMs, external ear canals normal                          Nose: septum midline, turbinates swollen, with erythema and clear discharge             Mouth/throat: MMM, tongue normal, mild pharyngeal erythema                           Neck: supple, no adenopathy, no thyromegaly, nontender                          Heart: RRR, normal S1, S2, no murmurs                         Lungs: CTA bilaterally, no wheezes, rales, or rhonchi. Normal work of breathing.       Assessment: Acute URI   Plan: Discussed diagnosis and treatment of URI. No evidence of a bacterial infection.  Suggested symptomatic OTC remedies. Nasal saline spray for congestion.  Tylenol OTC for fever and malaise.  Call/return in 2-3 days if symptoms aren't resolving. He will follow up  with his urologist for ongoing urinary symptoms.

## 2017-10-02 NOTE — Patient Instructions (Signed)
I suspect your symptoms are related to a viral illness and recommend treating your symptoms at this point.  Mucinex for congestion and cough, drink extra water in the morning and afternoon, use salt water gargles for throat irritation and Tylenol or Ibuprofen for aches and pains.  Call if you are not improving by days 7-10 of your illness or if you develop fever, wheezing or worsening symptoms.

## 2017-10-10 ENCOUNTER — Ambulatory Visit (INDEPENDENT_AMBULATORY_CARE_PROVIDER_SITE_OTHER): Payer: Medicare Other | Admitting: Cardiovascular Disease

## 2017-10-10 ENCOUNTER — Encounter: Payer: Self-pay | Admitting: Cardiovascular Disease

## 2017-10-10 VITALS — BP 126/58 | HR 68 | Ht 70.0 in | Wt 155.4 lb

## 2017-10-10 DIAGNOSIS — I2510 Atherosclerotic heart disease of native coronary artery without angina pectoris: Secondary | ICD-10-CM

## 2017-10-10 DIAGNOSIS — E785 Hyperlipidemia, unspecified: Secondary | ICD-10-CM

## 2017-10-10 DIAGNOSIS — I350 Nonrheumatic aortic (valve) stenosis: Secondary | ICD-10-CM | POA: Diagnosis not present

## 2017-10-10 NOTE — Progress Notes (Signed)
Cardiology Office Note Date:  10/13/2017   ID:  JOSHAU CODE, DOB [birth year], MRN [id number]  PCP:  [name], MD  Cardiologist:  [name], MD    Chief Complaint  Patient presents with  . Coronary Artery Disease     History of Present Illness: Corey Oneill is a 81 y.o. male who presents for follow-up evaluation.  The patient has coronary artery disease and aortic valve disease.  He has undergone stenting of the RCA.  He underwent TAVR in 2017 for treatment of severe symptomatic aortic stenosis.  He was seen by Richardson Dopp September 21, 2017 for evaluation of chest pain.  He was noted to have no acute EKG changes.  Isosorbide was added to his medical regimen.  He returns today for follow-up evaluation.  He is here alone today. Feeling ok with no recent chest pain. Denies shortness of breath. He feels depressed - has been talking with his daughter and has seen a psychiatrist. Has had suicidal thoughts but clearly states he would not act on them. Would like to see a therapist to be able to talk through things more.   Past Medical History:  Diagnosis Date  . Anginal pain ([place])   . Aortic stenosis    a. peak to peak gradient by LHC 8/16:  37 mmHg (moderately severe)   . CAD (coronary artery disease)    a.  LHC 8/16: Mid to distal LAD 30%, OM1 40%, proximal mid RCA 40%, distal RCA 60% >> FFR 0.69  >> PCI: 3 x 15 mm Resolute DES  . Depression   . Esophageal reflux   . Family history of adverse reaction to anesthesia    "daughter has PONV"  . H/O blood clots    "get them in my stool and urine" (11/12/2015)  . Heart murmur   . Hx of radiation therapy 04/14/13-06/09/13   prostate 7800 cGy, 40 sessions, seminal vesicles 5600 cGy 40 sessions  . Hyperlipidemia   . Malignant melanoma in situ ([place]) 09/04/2016   Right neck and chest  . Prostate cancer ([place]) dx'd 2014  . Radiation proctitis     Past Surgical History:  Procedure Laterality Date  . CARDIAC CATHETERIZATION N/A  04/21/2015   Procedure: Right/Left Heart Cath and Coronary Angiography;  Surgeon: [name], MD;  Location: [facility];  Service: Cardiovascular;  Laterality: N/A;  . CARDIAC CATHETERIZATION N/A 06/18/2015   Procedure: Intravascular Pressure Wire/FFR Study;  Surgeon: [name], MD;  Location: [facility];  Service: Cardiovascular;  Laterality: N/A;  . CARDIAC CATHETERIZATION N/A 06/18/2015   Procedure: Coronary Stent Intervention;  Surgeon: [name], MD;  Location: [facility];  Service: Cardiovascular;  Laterality: N/A;  . CARDIAC CATHETERIZATION N/A 06/18/2015   Procedure: Right/Left Heart Cath and Coronary Angiography;  Surgeon: [name], MD;  Location: [place] [facility];  Service: Cardiovascular;  Laterality: N/A;  . CARDIAC CATHETERIZATION N/A 06/23/2015   Procedure: Left Heart Cath and Cors/Grafts Angiography;  Surgeon: [name], MD;  Location: [place];  Service: Cardiovascular;  Laterality: N/A;  . CARDIAC CATHETERIZATION N/A 01/17/2016   Procedure: Right/Left Heart Cath and Coronary Angiography;  Surgeon: [name], MD;  Location: [facility];  Service: Cardiovascular;  Laterality: N/A;  . COLONOSCOPY    . FRACTURE SURGERY    . INGUINAL HERNIA REPAIR    . LAPAROSCOPIC APPENDECTOMY N/A 11/16/2015   Procedure: APPENDECTOMY LAPAROSCOPIC;  Surgeon: [name]  Cornett, MD;  Location: [facility];  Service: General;  Laterality: N/A;  . LEFT HEART CATH AND CORONARY ANGIOGRAPHY N/A 12/26/2016   Procedure: Left Heart Cath and Coronary Angiography;  Surgeon: Troy Sine, MD;  Location: [facility];  Service: Cardiovascular;  Laterality: N/A;  . MELANOMA EXCISION Right 10/24/2016   Procedure: WIDE EXCISION MELANOMA RIGHT NECK AND RIGHT CHEST TIMES 2;  Surgeon: [name], MD;  Location: [facility];  Service: General;  Laterality: Right;  right medial and lateral lesion of chest and right neck  . NASAL FRACTURE SURGERY      "broken years ago; several ORs to correct it"  . NASAL SEPTUM SURGERY    . PROSTATE BIOPSY  2014   "needle biopsy"  . TEE WITHOUT CARDIOVERSION N/A 02/15/2016   Procedure: TRANSESOPHAGEAL ECHOCARDIOGRAM (TEE);  Surgeon: [name], MD;  Location: [facility];  Service: Open Heart Surgery;  Laterality: N/A;  . TONSILLECTOMY    . TRANSCATHETER AORTIC VALVE REPLACEMENT, TRANSFEMORAL  02/15/2016  . TRANSCATHETER AORTIC VALVE REPLACEMENT, TRANSFEMORAL N/A 02/15/2016   Procedure: TRANSCATHETER AORTIC VALVE REPLACEMENT, TRANSFEMORAL;  Surgeon: [name], MD;  Location: [facility];  Service: Open Heart Surgery;  Laterality: N/A;    Current Outpatient Medications  Medication Sig Dispense Refill  . acetaminophen (TYLENOL) 500 MG tablet Take 500 mg by mouth every 8 (eight) hours as needed for mild pain. Reported on 11/26/2015    . aspirin EC 81 MG tablet Take 1 tablet (81 mg total) daily by mouth. 90 tablet 3  . carvedilol (COREG) 3.125 MG tablet Take 3.125 mg by mouth 2 (two) times daily with a meal.    . cyanocobalamin 2000 MCG tablet Take 2,000 mcg by mouth daily.    . DULoxetine (CYMBALTA) 30 MG capsule Take 1 capsule (30 mg total) daily by mouth. 30 capsule 1  . isosorbide mononitrate (IMDUR) 30 MG 24 hr tablet Take 15 mg by mouth daily.    [name] MAGNESIUM CITRATE PO Take 2,500 Units by mouth.    . nitroGLYCERIN (NITROSTAT) 0.4 MG SL tablet Place 1 tablet (0.4 mg total) under the tongue every 5 (five) minutes as needed for chest pain. 25 tablet 3  . rosuvastatin (CRESTOR) 10 MG tablet Take 1 tablet (10 mg total) daily by mouth. 90 tablet 1   No current facility-administered medications for this visit.     Allergies:   Patient has no known allergies.   Social History:  The patient  reports that he has quit smoking. His smoking use included cigarettes. He smoked 0.00 packs per day. he has never used smokeless tobacco. He reports that he drinks alcohol. He reports that he does not use drugs.   Family  History:  The patient's  family history includes Brain cancer in his father; Depression in his daughter; Lymphoma in his mother.    ROS:  Please see the history of present illness.  Otherwise, review of systems is positive for fatigue, depression, balance problems.  All other systems are reviewed and negative.    PHYSICAL EXAM: VS:  BP (!) 126/58   Pulse 68   Ht 5\' 10"  (1.778 m)   Wt 155 lb 6.4 oz (70.5 kg)   BMI 22.30 kg/m  , BMI Body mass index is 22.3 kg/m. GEN: Well nourished, well developed, in no acute distress  HEENT: normal  Neck: no JVD, no masses. No carotid bruits Cardiac: RRR with 2/6 SEM at the RUSB  Respiratory:  clear to auscultation bilaterally, normal work of breathing GI: soft, nontender, nondistended, + BS MS: no deformity or atrophy  Ext: no pretibial edema, pedal pulses 2+= bilaterally Skin: warm and dry, no rash Neuro:  Strength and sensation are intact Psych: euthymic mood, full affect  EKG:  EKG is not ordered today.  Recent Labs: 09/21/2017: ALT 16; BUN 18.7; Creatinine 0.9; HGB 12.8; Platelets 57; Potassium 4.4; Sodium 141   Lipid Panel     Component Value Date/Time   CHOL 182 07/05/2017 0957   TRIG 67 07/05/2017 0957   HDL 61 07/05/2017 0957   CHOLHDL 3.0 07/05/2017 0957   VLDL 13 07/05/2017 0957   LDLCALC 108 (H) 07/05/2017 0957      Wt Readings from Last 3 Encounters:  10/10/17 155 lb 6.4 oz (70.5 kg)  10/02/17 155 lb (70.3 kg)  09/24/17 159 lb 4.8 oz (72.3 kg)     ASSESSMENT AND PLAN: 1.  CAD, native vessel, with angina: appears stable on current regimen. Has had a good response to the addition of isosorbide. No changes recommended today. Advised he can discontinue famotidine.  2. Aortic stenosis s/p TAVR: normal valve function on post-op FU imaging. Continue clinical FU.  3. Hyperlipidemia: continue statin.   4. Fatigue/depression: pt given information for Behavioral Health evaluation as he would like to establish with a  counselor.   Current medicines are reviewed with the patient today.  The patient does not have concerns regarding medicines.  Labs/ tests ordered today include:  No orders of the defined types were placed in this encounter.   Disposition:   FU 6 months  Signed, [name], MD  10/13/2017 10:58 PM    [address], [place], [place]  [address] Phone: [phone]; Fax: [phone]

## 2017-10-10 NOTE — Patient Instructions (Addendum)
Medication Instructions:  1) STOP PEPCID   Labwork: None  Testing/Procedures: None  Follow-Up: Your provider wants you to follow-up in: 6 months with Dr. Burt Knack. You will receive a reminder letter in the mail two months in advance. If you don't receive a letter, please call our office to schedule the follow-up appointment.    Any Other Special Instructions Will Be Listed Below (If Applicable). [place]  If you are a new patient, you can schedule an appointment for an initial assessment to determine if counseling is appropriate for you by filling out our online form or by calling [phone].     If you need a refill on your cardiac medications before your next appointment, please call your pharmacy.

## 2017-11-01 ENCOUNTER — Ambulatory Visit: Payer: Medicare Other | Admitting: Oncology

## 2017-11-01 ENCOUNTER — Other Ambulatory Visit: Payer: Medicare Other

## 2017-11-01 ENCOUNTER — Ambulatory Visit (INDEPENDENT_AMBULATORY_CARE_PROVIDER_SITE_OTHER): Payer: Medicare Other | Admitting: Family Medicine

## 2017-11-01 ENCOUNTER — Encounter: Payer: Self-pay | Admitting: Family Medicine

## 2017-11-01 VITALS — BP 122/60 | HR 72 | Resp 16 | Ht 70.25 in | Wt 155.2 lb

## 2017-11-01 DIAGNOSIS — I2510 Atherosclerotic heart disease of native coronary artery without angina pectoris: Secondary | ICD-10-CM | POA: Diagnosis not present

## 2017-11-01 DIAGNOSIS — H8110 Benign paroxysmal vertigo, unspecified ear: Secondary | ICD-10-CM

## 2017-11-01 MED ORDER — MECLIZINE HCL 12.5 MG PO TABS: ORAL_TABLET | ORAL | 0 refills | Status: DC

## 2017-11-01 NOTE — Progress Notes (Signed)
   Subjective:    Patient ID: Corey Oneill, male    DOB: Oct 31, 1936, 81 y.o.   MRN: [id number]  HPI He complains that 10 days ago he woke up and felt quite dizzy.  The symptoms lasted approximately 1 minute.  The dizziness would occur sometimes when he would look to his left and at other times looking to his right.  No associated heart rate changes, visual changes, weakness.  Sometimes if he would move during that time the symptoms would be worse.  He notes that these are mainly occurring at night.   Review of Systems     Objective:   Physical Exam Alert and in no distress.  EOMI.  Head motion in any position today causes no recurrence of the symptoms.  Carotid shows no bruits.  Cardiac exam shows regular rhythm without murmurs or gallops.  Normal motor sensory and DTRs of his arms.       Assessment & Plan:  Benign paroxysmal positional vertigo, unspecified laterality - Plan: meclizine (ANTIVERT) 12.5 MG tablet I explained that his symptoms seem to be mainly BPPV and we will be conservative in his care using meclizine only at night to see if this helps.  Discussed the possibility of using Epley maneuvers.

## 2017-11-19 ENCOUNTER — Other Ambulatory Visit: Payer: Self-pay | Admitting: Family Medicine

## 2017-11-19 DIAGNOSIS — D696 Thrombocytopenia, unspecified: Secondary | ICD-10-CM

## 2017-11-20 ENCOUNTER — Ambulatory Visit (INDEPENDENT_AMBULATORY_CARE_PROVIDER_SITE_OTHER): Payer: Medicare Other | Admitting: Family Medicine

## 2017-11-20 ENCOUNTER — Encounter: Payer: Self-pay | Admitting: Family Medicine

## 2017-11-20 VITALS — BP 128/78 | HR 59 | Resp 16 | Ht 70.0 in | Wt 156.6 lb

## 2017-11-20 DIAGNOSIS — I951 Orthostatic hypotension: Secondary | ICD-10-CM

## 2017-11-20 DIAGNOSIS — H8110 Benign paroxysmal vertigo, unspecified ear: Secondary | ICD-10-CM | POA: Diagnosis not present

## 2017-11-20 NOTE — Progress Notes (Signed)
   Subjective:    Patient ID: Corey Oneill, male    DOB: 04-16-1936, 82 y.o.   MRN: [id number]  HPI He is here for consult concerning difficulty with dizziness.  Over the last several weeks he has had dizzy episodes while lying in bed.  He notes that when he turns his head to one side and it would cause dizziness  While upright he also has noted when he would look straight up he would get dizzy but when he would straighten his head out to a neutral position the dizziness would go away. He also describes a feeling of dizziness if he gets out of bed too quickly.  He recognizes this is an ongoing issue and now makes it a point of sitting before standing and walking.  He describes these as 2 different issues.   Review of Systems     Objective:   Physical Exam Alert and in no distress.  Dizziness was elicited when he tipped his head upward and it did go away when he got in neutral position.  Lifting the left and looking like it caused no trouble.  Cardiac exam is normal.  DTRs normal.       Assessment & Plan:  Benign paroxysmal positional vertigo, unspecified laterality - Plan: Ambulatory referral to Physical Therapy  Orthostatic hypotension He recognizes the difference between the 2 episodes of dizziness and knows to move slowly when he gets out of bed.  I will refer him for Epley maneuvers for the BPPV

## 2017-11-21 ENCOUNTER — Telehealth: Payer: Self-pay | Admitting: Family Medicine

## 2017-11-21 NOTE — Telephone Encounter (Signed)
Called patient and notified him that should be contacting him now.

## 2017-11-21 NOTE — Telephone Encounter (Signed)
Shreyan called and left message on voice mail that he hasn't heard from the referral yet.  Please call pt [phone]

## 2017-11-21 NOTE — Telephone Encounter (Signed)
Louretta Shorten handled this, and they will contact him.

## 2017-11-26 ENCOUNTER — Telehealth: Payer: Self-pay | Admitting: Family Medicine

## 2017-11-26 NOTE — Telephone Encounter (Signed)
Pt called very frustrated that they have yet to hear from Neuro for rehab of dizziness. I called outpt Neuro at [facility] [phone] t/w [facility], she will call him today. Called pt advised of same and gave him their number.

## 2017-11-28 ENCOUNTER — Encounter: Payer: Self-pay | Admitting: Physical Therapy

## 2017-11-28 ENCOUNTER — Other Ambulatory Visit: Payer: Self-pay

## 2017-11-28 ENCOUNTER — Ambulatory Visit: Payer: Medicare Other | Attending: Family Medicine | Admitting: Physical Therapy

## 2017-11-28 DIAGNOSIS — R2681 Unsteadiness on feet: Secondary | ICD-10-CM | POA: Diagnosis not present

## 2017-11-28 DIAGNOSIS — R293 Abnormal posture: Secondary | ICD-10-CM | POA: Diagnosis not present

## 2017-11-28 DIAGNOSIS — L821 Other seborrheic keratosis: Secondary | ICD-10-CM | POA: Diagnosis not present

## 2017-11-28 DIAGNOSIS — H8113 Benign paroxysmal vertigo, bilateral: Secondary | ICD-10-CM | POA: Diagnosis not present

## 2017-11-28 DIAGNOSIS — D1801 Hemangioma of skin and subcutaneous tissue: Secondary | ICD-10-CM | POA: Diagnosis not present

## 2017-11-28 DIAGNOSIS — R262 Difficulty in walking, not elsewhere classified: Secondary | ICD-10-CM | POA: Insufficient documentation

## 2017-11-28 DIAGNOSIS — R42 Dizziness and giddiness: Secondary | ICD-10-CM | POA: Diagnosis not present

## 2017-11-28 DIAGNOSIS — R269 Unspecified abnormalities of gait and mobility: Secondary | ICD-10-CM | POA: Insufficient documentation

## 2017-11-28 DIAGNOSIS — Z85820 Personal history of malignant melanoma of skin: Secondary | ICD-10-CM | POA: Diagnosis not present

## 2017-11-28 DIAGNOSIS — D229 Melanocytic nevi, unspecified: Secondary | ICD-10-CM | POA: Diagnosis not present

## 2017-11-28 NOTE — Therapy (Signed)
[facility] [address] [place], [place], [address] Phone: [phone]   Fax:  [phone]  Physical Therapy Evaluation  Patient Details  Name: Corey Oneill MRN: [id number] Date of Birth: 10/10/1936 Referring Provider: [name] MD   Encounter Date: 11/28/2017  PT End of Session - 11/28/17 1945    Visit Number  1    Number of Visits  5    Date for PT Re-Evaluation  12/28/17    Authorization Type  MCR; AARP    Authorization Time Period  11/28/17 to 01/27/2018    PT Start Time  1101    PT Stop Time  1146    PT Time Calculation (min)  45 min    Activity Tolerance  Patient tolerated treatment well    Behavior During Therapy  [facility] for tasks assessed/performed       Past Medical History:  Diagnosis Date  . Anginal pain ([place])   . Aortic stenosis    a. peak to peak gradient by LHC 8/16:  37 mmHg (moderately severe)   . CAD (coronary artery disease)    a.  LHC 8/16: Mid to distal LAD 30%, OM1 40%, proximal mid RCA 40%, distal RCA 60% >> FFR 0.69  >> PCI: 3 x 15 mm Resolute DES  . Depression   . Esophageal reflux   . Family history of adverse reaction to anesthesia    "daughter has PONV"  . H/O blood clots    "get them in my stool and urine" (11/12/2015)  . Heart murmur   . Hx of radiation therapy 04/14/13-06/09/13   prostate 7800 cGy, 40 sessions, seminal vesicles 5600 cGy 40 sessions  . Hyperlipidemia   . 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 Prostate cancer ([place]) dx'd 2014  . Radiation proctitis     Past Surgical History:  Procedure Laterality Date  . CARDIAC CATHETERIZATION N/A 04/21/2015   Procedure: Right/Left Heart Cath and Coronary Angiography;  Surgeon: [name], MD;  Location: [facility];  Service: Cardiovascular;  Laterality: N/A;  . CARDIAC CATHETERIZATION N/A 06/18/2015   Procedure: Intravascular Pressure Wire/FFR Study;  Surgeon: [name], MD;   Location: [facility] [facility];  Service: Cardiovascular;  Laterality: N/A;  . CARDIAC CATHETERIZATION N/A 06/18/2015   Procedure: Coronary Stent Intervention;  Surgeon: [name], MD;  Location: [place] [facility];  Service: Cardiovascular;  Laterality: N/A;  . CARDIAC CATHETERIZATION N/A 06/18/2015   Procedure: Right/Left Heart Cath and Coronary Angiography;  Surgeon: [name], MD;  Location: [place] [facility];  Service: Cardiovascular;  Laterality: N/A;  . CARDIAC CATHETERIZATION N/A 06/23/2015   Procedure: Left Heart Cath and Cors/Grafts Angiography;  Surgeon: [name], MD;  Location: [facility];  Service: Cardiovascular;  Laterality: N/A;  . CARDIAC CATHETERIZATION N/A 01/17/2016   Procedure: Right/Left Heart Cath and Coronary Angiography;  Surgeon: [name], MD;  Location: [facility] [facility];  Service: Cardiovascular;  Laterality: N/A;  . COLONOSCOPY    . FRACTURE SURGERY    . INGUINAL HERNIA REPAIR    . LAPAROSCOPIC APPENDECTOMY N/A 11/16/2015   Procedure: APPENDECTOMY LAPAROSCOPIC;  Surgeon: [name], MD;  Location: [facility];  Service: General;  Laterality: N/A;  . LEFT HEART CATH AND CORONARY ANGIOGRAPHY N/A 12/26/2016   Procedure: Left Heart Cath and Coronary Angiography;  Surgeon: Troy Sine, MD;  Location: [facility];  Service: Cardiovascular;  Laterality: N/A;  . MELANOMA EXCISION Right 10/24/2016   Procedure: WIDE EXCISION MELANOMA RIGHT NECK AND RIGHT CHEST TIMES 2;  Surgeon: [name], MD;  Location: [facility];  Service: General;  Laterality: Right;  right medial and lateral lesion of chest and right neck  . NASAL FRACTURE SURGERY     "broken years ago; several ORs to correct it"  . NASAL SEPTUM SURGERY    . PROSTATE BIOPSY  2014   "needle biopsy"  . TEE WITHOUT CARDIOVERSION N/A 02/15/2016   Procedure: TRANSESOPHAGEAL ECHOCARDIOGRAM (TEE);  Surgeon: [name], MD;  Location: [facility];  Service: Open Heart Surgery;   Laterality: N/A;  . TONSILLECTOMY    . TRANSCATHETER AORTIC VALVE REPLACEMENT, TRANSFEMORAL  02/15/2016  . TRANSCATHETER AORTIC VALVE REPLACEMENT, TRANSFEMORAL N/A 02/15/2016   Procedure: TRANSCATHETER AORTIC VALVE REPLACEMENT, TRANSFEMORAL;  Surgeon: [name], MD;  Location: [facility];  Service: Open Heart Surgery;  Laterality: N/A;    There were no vitals filed for this visit.   Subjective Assessment - 11/28/17 1106    Subjective  I wake up and the room is spinning (worst when lying on left side, also on right side, and supine). I have to sit on the edge of the bed for at least a minute and then I can stand up. My first few steps are a bit wobbly. Reports using bil hearing aids x 4 yrs. No change in hearing with onset of vertigo.    Pertinent History  CAD, aortic stenosis, depression, prostate cancer, melanoma    Patient Stated Goals  to not fall; feel steady on his feet    Currently in Pain?  No/denies         [facility] PT Assessment - 11/28/17 0001      Assessment   Medical Diagnosis  BPPV     Referring Provider  [name] MD    Onset Date/Surgical Date  -- ~November 2018    Prior Therapy  none for vertigo      Precautions   Precautions  Fall    Precaution Comments  due to vertigo      Restrictions   Weight Bearing Restrictions  No      Balance Screen   Has the patient fallen in the past 6 months  No    Has the patient had a decrease in activity level because of a fear of falling?   Yes    Is the patient reluctant to leave their home because of a fear of falling?   No      Prior Function   Level of Independence  Independent      Cognition   Overall Cognitive Status  Within Functional Limits for tasks assessed      Observation/Other Assessments   Observations  unsteady, wide base of support for gait lobby to exam room    Focus on Therapeutic Outcomes (FOTO)   Self-report FS 97%; risk adjusted FS 64%      Coordination   Gross Motor Movements are Fluid and  Coordinated  Yes      Posture/Postural Control   Posture/Postural Control  Postural limitations    Postural Limitations  Forward head      Transfers   Transfers  Stand to Sit;Sit to Stand    Sit to Stand  4: Min guard    Stand to Sit  5: Supervision    Comments  requires assist due to imbalance/vertigo      Ambulation/Gait   Ambulation/Gait  Yes    Ambulation/Gait Assistance  5: Supervision    Ambulation/Gait Assistance Details  supervision due to imbalance/vertigo    Ambulation Distance (Feet)  60 Feet 70    Assistive device  None    Gait Pattern  Step-through pattern;Decreased weight shift to right;Decreased weight shift to left;Decreased trunk rotation;Wide base of support;Poor foot clearance - left;Poor foot clearance - right    Ambulation Surface  Level;Indoor         Vestibular Assessment - 11/28/17 0001      Vestibular Assessment   General Observation  Reports onset ~November. Spinning when awakened and each morning since      Symptom Behavior   Type of Dizziness  Spinning    Frequency of Dizziness  daily    Duration of Dizziness  60 sec    Aggravating Factors  Supine to sit    Relieving Factors  Head stationary;Rest      Occulomotor Exam   Occulomotor Alignment  Abnormal    Spontaneous  Absent    Gaze-induced  Right beating nystagmus with R gaze 2 beats considered WNL    Head shaking Horizontal  Absent    Smooth Pursuits  Intact    Saccades  Intact      Positional Testing   Dix-Hallpike  Dix-Hallpike Right;Dix-Hallpike Left    Horizontal Canal Testing  Horizontal Canal Right;Horizontal Canal Left      Dix-Hallpike Right   Dix-Hallpike Right Duration  0    Dix-Hallpike Right Symptoms  Other (comment) nausea      Dix-Hallpike Left   Dix-Hallpike Left Duration  0    Dix-Hallpike Left Symptoms  Other (comment) mild nausea      Horizontal Canal Right   Horizontal Canal Right Duration  0    Horizontal Canal Right Symptoms  Normal      Horizontal Canal  Left   Horizontal Canal Left Duration  0    Horizontal Canal Left Symptoms  Normal *difficult to position due to limited L cervical rotation      Cognition   Cognition Orientation Level  Appropriate for developmental age         Objective measurements completed on examination: See above findings.       Vestibular Treatment/Exercise - 11/28/17 0001      Vestibular Treatment/Exercise   Vestibular Treatment Provided  Canalith Repositioning    Canalith Repositioning  Epley Manuever Right;Epley Manuever Left       EPLEY MANUEVER RIGHT   Number of Reps   1    Overall Response  No change    Response Details   used inverted table for positioning; +vertigo at first and third positions resolving in <30 seconds       EPLEY MANUEVER LEFT   Number of Reps   1    Overall Response   No change     RESPONSE DETAILS LEFT  used inverted table for positioning; +vertigo at first and third positions resolving in <30 seconds            PT Education - 11/28/17 1943    Education provided  Yes    Education Details  role of PT with vestibular rehab; What BPPV is; purpose of cannalith reposistioning    Person(s) Educated  Patient    Methods  Explanation    Comprehension  Verbalized understanding          PT Long Term Goals - 11/28/17 2002      PT LONG TERM GOAL #1  Title  Patient will be independent with HEP for vertigo and balance deficits. (Target for LTGs 12/13/17)    Time  2    Period  Weeks    Status  New    Target Date  12/13/17      PT LONG TERM GOAL #2   Title  Patient will have no vertigo with positional testing.     Time  2    Period  Weeks    Status  New      PT LONG TERM GOAL #3   Title  Patient will self-report at least 50% improvement in overall balance.     Time  2    Period  Weeks    Status  New             Plan - 11/28/17 1948    Clinical Impression Statement  Patient referred for OPPT evaluation with diagnosis of BPPV. Positioning for assessment  and treatment complicated by limited neck extension and left rotation. Ultimately required use of specialty table to lower upper half of body ~30 degrees below horizontal and 2 person assist to safely complete maneuvers. Patient did experience brief episodes of spinning and nausea with testing/treatment although no nystagmus was visualized. Time of session did not permit repeated treatment of bil posterior canals to assess effectiveness of cannalith repositioning maneuvers. Patient can benefit from short course of PT for continued vestibular assessment, and potentially vestibular treatment.     History and Personal Factors relevant to plan of care:  PMH-none significant related to vestibular rehab intervention  Personal Factors-anticipated rate of recovery unclear due to ROM limitations     Clinical Presentation  Stable    Clinical Presentation due to:  symptoms have been consistent since onset ~ 2 months ago    Clinical Decision Making  Low    Rehab Potential  Fair    Clinical Impairments Affecting Rehab Potential  limited cervical ROM;     PT Frequency  2x / week    PT Duration  2 weeks    PT Treatment/Interventions  ADLs/Self Care Home Management;Canalith Repostioning;Gait training;Functional mobility training;Therapeutic activities;Therapeutic exercise;Balance training;Neuromuscular re-education;Manual techniques;Patient/family education;Passive range of motion;Visual/perceptual remediation/compensation;Vestibular    PT Next Visit Plan  reassess Dix- Hallpike bil (use table in splint room due to limited neck ROM) and proceed with repositioning if approp vs assess for hypofunction or other cause for vertigo; give HEP as approp    Consulted and Agree with Plan of Care  Patient       Patient will benefit from skilled therapeutic intervention in order to improve the following deficits and impairments:  Abnormal gait, Decreased activity tolerance, Decreased balance, Decreased range of motion,  Dizziness, Postural dysfunction  Visit Diagnosis: Abnormality of gait - Plan: PT plan of care cert/re-cert  BPPV (benign paroxysmal positional vertigo), bilateral - Plan: PT plan of care cert/re-cert  Unsteadiness on feet - Plan: PT plan of care cert/re-cert     Problem List Patient Active Problem List   Diagnosis Date Noted  . Radiation proctitis   . Hx of radiation therapy   . Heart murmur   . H/O blood clots   . Family history of adverse reaction to anesthesia   . CAD (coronary artery disease)   . Aortic stenosis   . Anginal pain ([place])   . History of ITP 02/26/2017  . 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  S/P appendectomy 11/12/2015  . Coronary artery disease   . Esophageal reflux 03/22/2015  . Depression 11/20/2014  . Prostate cancer ([place]) 03/05/2013  . Erectile dysfunction   . Hyperlipidemia 06/07/2007    Rexanne Mano, PT 11/28/2017, 8:09 PM  [facility] [address] [place], [place], [address] Phone: [phone]   Fax:  [phone]  Name: Corey Oneill MRN: [id number] Date of Birth: Mar 23, 1936

## 2017-11-29 ENCOUNTER — Ambulatory Visit: Payer: Medicare Other

## 2017-11-29 DIAGNOSIS — R2681 Unsteadiness on feet: Secondary | ICD-10-CM | POA: Diagnosis not present

## 2017-11-29 DIAGNOSIS — R42 Dizziness and giddiness: Secondary | ICD-10-CM | POA: Diagnosis not present

## 2017-11-29 DIAGNOSIS — H8113 Benign paroxysmal vertigo, bilateral: Secondary | ICD-10-CM

## 2017-11-29 DIAGNOSIS — R262 Difficulty in walking, not elsewhere classified: Secondary | ICD-10-CM | POA: Diagnosis not present

## 2017-11-29 DIAGNOSIS — R293 Abnormal posture: Secondary | ICD-10-CM | POA: Diagnosis not present

## 2017-11-29 DIAGNOSIS — R269 Unspecified abnormalities of gait and mobility: Secondary | ICD-10-CM | POA: Diagnosis not present

## 2017-11-29 NOTE — Therapy (Signed)
[facility] [address] [place], [place], [address] Phone: [phone]   Fax:  [phone]  Physical Therapy Treatment  Patient Details  Name: Corey Oneill MRN: [id number] Date of Birth: 13-Oct-1936 Referring Provider: [name] MD   Encounter Date: 11/29/2017  PT End of Session - 11/29/17 1406    Visit Number  2    Number of Visits  5    Date for PT Re-Evaluation  12/28/17    Authorization Type  MCR; AARP    PT Start Time  1020    PT Stop Time  1053 pt too woozy to continue    PT Time Calculation (min)  33 min    Equipment Utilized During Treatment  -- min guard to S prn    Activity Tolerance  Patient tolerated treatment well    Behavior During Therapy  [facility] for tasks assessed/performed       Past Medical History:  Diagnosis Date  . Anginal pain ([place])   . Aortic stenosis    a. peak to peak gradient by LHC 8/16:  37 mmHg (moderately severe)   . CAD (coronary artery disease)    a.  LHC 8/16: Mid to distal LAD 30%, OM1 40%, proximal mid RCA 40%, distal RCA 60% >> FFR 0.69  >> PCI: 3 x 15 mm Resolute DES  . Depression   . Esophageal reflux   . Family history of adverse reaction to anesthesia    "daughter has PONV"  . H/O blood clots    "get them in my stool and urine" (11/12/2015)  . Heart murmur   . Hx of radiation therapy 04/14/13-06/09/13   prostate 7800 cGy, 40 sessions, seminal vesicles 5600 cGy 40 sessions  . Hyperlipidemia   . Malignant melanoma in situ ([place]) 09/04/2016   Right neck and chest  . Prostate cancer ([place]) dx'd 2014  . Radiation proctitis     Past Surgical History:  Procedure Laterality Date  . CARDIAC CATHETERIZATION N/A 04/21/2015   Procedure: Right/Left Heart Cath and Coronary Angiography;  Surgeon: [name], MD;  Location: [place] [facility];  Service: Cardiovascular;  Laterality: N/A;  . CARDIAC CATHETERIZATION N/A 06/18/2015   Procedure: Intravascular Pressure Wire/FFR Study;   Surgeon: [name], MD;  Location: [facility];  Service: Cardiovascular;  Laterality: N/A;  . CARDIAC CATHETERIZATION N/A 06/18/2015   Procedure: Coronary Stent Intervention;  Surgeon: [name], MD;  Location: [facility];  Service: Cardiovascular;  Laterality: N/A;  . CARDIAC CATHETERIZATION N/A 06/18/2015   Procedure: Right/Left Heart Cath and Coronary Angiography;  Surgeon: [name], MD;  Location: [facility];  Service: Cardiovascular;  Laterality: N/A;  . CARDIAC CATHETERIZATION N/A 06/23/2015   Procedure: Left Heart Cath and Cors/Grafts Angiography;  Surgeon: [name], MD;  Location: [facility];  Service: Cardiovascular;  Laterality: N/A;  . CARDIAC CATHETERIZATION N/A 01/17/2016   Procedure: Right/Left Heart Cath and Coronary Angiography;  Surgeon: [name], MD;  Location: [facility];  Service: Cardiovascular;  Laterality: N/A;  . COLONOSCOPY    . FRACTURE SURGERY    . INGUINAL HERNIA REPAIR    . LAPAROSCOPIC APPENDECTOMY N/A 11/16/2015   Procedure: APPENDECTOMY LAPAROSCOPIC;  Surgeon: [name], MD;  Location: [facility];  Service: General;  Laterality: N/A;  . LEFT HEART CATH AND CORONARY ANGIOGRAPHY N/A 12/26/2016   Procedure: Left Heart Cath and Coronary Angiography;  Surgeon: Troy Sine, MD;  Location: [facility];  Service: Cardiovascular;  Laterality: N/A;  . MELANOMA EXCISION Right 10/24/2016   Procedure: WIDE EXCISION MELANOMA RIGHT NECK AND RIGHT CHEST TIMES 2;  Surgeon: [name], MD;  Location: [facility];  Service: General;  Laterality: Right;  right medial and lateral lesion of chest and right neck  . NASAL FRACTURE SURGERY     "broken years ago; several ORs to correct it"  . NASAL SEPTUM SURGERY    . PROSTATE BIOPSY  2014   "needle biopsy"  . TEE WITHOUT CARDIOVERSION N/A 02/15/2016   Procedure: TRANSESOPHAGEAL ECHOCARDIOGRAM (TEE);  Surgeon: [name], MD;  Location: [facility];  Service:  Open Heart Surgery;  Laterality: N/A;  . TONSILLECTOMY    . TRANSCATHETER AORTIC VALVE REPLACEMENT, TRANSFEMORAL  02/15/2016  . TRANSCATHETER AORTIC VALVE REPLACEMENT, TRANSFEMORAL N/A 02/15/2016   Procedure: TRANSCATHETER AORTIC VALVE REPLACEMENT, TRANSFEMORAL;  Surgeon: [name], MD;  Location: [facility];  Service: Open Heart Surgery;  Laterality: N/A;    There were no vitals filed for this visit.  Subjective Assessment - 11/29/17 1023    Subjective  Pt reported he feels much better today, he's not had dizziness since the treatment.     Pertinent History  CAD, aortic stenosis, depression, prostate cancer, melanoma    Patient Stated Goals  to not fall; feel steady on his feet    Currently in Pain?  No/denies         [facility] PT Assessment - 11/29/17 1035      Functional Gait  Assessment   Gait assessed   Yes    Gait Level Surface  Walks 20 ft in less than 7 sec but greater than 5.5 sec, uses assistive device, slower speed, mild gait deviations, or deviates 6-10 in outside of the 12 in walkway width. 6.5 sec.    Change in Gait Speed  Able to smoothly change walking speed without loss of balance or gait deviation. Deviate no more than 6 in outside of the 12 in walkway width.    Gait with Horizontal Head Turns  Performs head turns smoothly with no change in gait. Deviates no more than 6 in outside 12 in walkway width    Gait with Vertical Head Turns  Performs head turns with no change in gait. Deviates no more than 6 in outside 12 in walkway width.    Gait and Pivot Turn  Pivot turns safely within 3 sec and stops quickly with no loss of balance.    Step Over Obstacle  Is able to step over 2 stacked shoe boxes taped together (9 in total height) without changing gait speed. No evidence of imbalance.    Gait with Narrow Base of Support  Ambulates 7-9 steps. 8 steps    Gait with Eyes Closed  Walks 20 ft, uses assistive device, slower speed, mild gait deviations, deviates 6-10 in outside 12 in  walkway width. Ambulates 20 ft in less than 9 sec but greater than 7 sec.    Ambulating Backwards  Walks 20 ft, uses assistive device, slower speed, mild gait deviations, deviates 6-10 in outside 12 in walkway width.    Steps  Alternating feet, no rail.    Total Score  26    FGA comment:  26/30: indicating low falls risk. Pt reported wooziness during vertical and horizontal head turns, 5-6/10 wooziness.          Vestibular Assessment - 11/29/17 1030      Positional Testing  Dix-Hallpike  Dix-Hallpike Right;Dix-Hallpike Left      Dix-Hallpike Right   Dix-Hallpike Right Duration  none    Dix-Hallpike Right Symptoms  No nystagmus      Dix-Hallpike Left   Dix-Hallpike Left Duration  none    Dix-Hallpike Left Symptoms  No nystagmus         Neuro re-ed; Pt performed counter gait activities to decr. Dizziness. Please see pt instructions for HEP details. PT instructed pt to not perform HEP until wooziness subsides. Performed with S for safety.             PT Education - 11/29/17 1405    Education provided  Yes    Education Details  PT discussed FGA results and provided pt with balance HEP to reduce dizziness but instructed pt to hold off on HEP until wooziness subsides. PT discussed negative B Dix-Hallpike results and that vertigo seems to be cleared.     Person(s) Educated  Patient    Methods  Explanation;Demonstration;Verbal cues;Handout    Comprehension  Returned demonstration;Verbalized understanding          PT Long Term Goals - 11/28/17 2002      PT LONG TERM GOAL #1   Title  Patient will be independent with HEP for vertigo and balance deficits. (Target for LTGs 12/13/17)    Time  2    Period  Weeks    Status  New    Target Date  12/13/17      PT LONG TERM GOAL #2   Title  Patient will have no vertigo with positional testing.     Time  2    Period  Weeks    Status  New      PT LONG TERM GOAL #3   Title  Patient will self-report at least 50%  improvement in overall balance.     Time  2    Period  Weeks    Status  New            Plan - 11/29/17 1407    Clinical Impression Statement  Pt demonstrated progress, as he denied dizziness during positional testing (and since treament yesterday). No nystagmus noted during positional testing. Pt's FGA score of 26/30 indicates pt is at low falls risk. However, pt reported wooziness (5-6/10) during head turns and required seated rest break to decr. Pt would continue to benefit from skilled PT to improve safety during functional mobility and to ensure dizziness is resolved.     Rehab Potential  Fair    Clinical Impairments Affecting Rehab Potential  limited cervical ROM;     PT Frequency  2x / week    PT Duration  2 weeks    PT Treatment/Interventions  ADLs/Self Care Home Management;Canalith Repostioning;Gait training;Functional mobility training;Therapeutic activities;Therapeutic exercise;Balance training;Neuromuscular re-education;Manual techniques;Patient/family education;Passive range of motion;Visual/perceptual remediation/compensation;Vestibular    PT Next Visit Plan  reassess Dix- Hallpike bil as needed (use table in splint room due to limited neck ROM) Review gait with head turns to decr. dizziness (hypofunction)    Consulted and Agree with Plan of Care  Patient       Patient will benefit from skilled therapeutic intervention in order to improve the following deficits and impairments:  Abnormal gait, Decreased activity tolerance, Decreased balance, Decreased range of motion, Dizziness, Postural dysfunction  Visit Diagnosis: BPPV (benign paroxysmal positional vertigo), bilateral  Unsteadiness on feet     Problem List Patient Active Problem List   Diagnosis Date Noted  . Radiation  proctitis   . Hx of radiation therapy   . Heart murmur   . H/O blood clots   . Family history of adverse reaction to anesthesia   . CAD (coronary artery disease)   . Aortic stenosis   .  Anginal pain ([place])   . History of ITP 02/26/2017  . 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 S/P appendectomy 11/12/2015  . Coronary artery disease   . Esophageal reflux 03/22/2015  . Depression 11/20/2014  . Prostate cancer ([place]) 03/05/2013  . Erectile dysfunction   . Hyperlipidemia 06/07/2007    Tylique Aull L 11/29/2017, 2:11 PM  [facility] [address] [place], [place], [address] Phone: [phone]   Fax:  [phone]  Name: Corey Oneill MRN: [id number] Date of Birth: 17-Sep-1936  Geoffry Paradise, PT,DPT 11/29/17 2:12 PM Phone: [phone] Fax: [phone]

## 2017-11-29 NOTE — Patient Instructions (Addendum)
Up / Down Head Motion    Hold counter as needed. Walking on solid surface, move head and eyes toward ceiling for __2__ steps. Then, move head and eyes toward floor for __2__ steps. Repeat __4__ times per session. Do __1__ sessions per day.  Copyright  VHI. All rights reserved.  Side to Side Head Motion    Perform without assistive device. Walking on solid surface, turn head and eyes to left for __2__ steps. Then, turn head and eyes to opposite side for __2__ steps. Repeat sequence __4__ times per session. Do __1__ sessions per day.  Copyright  VHI. All rights reserved.

## 2017-11-30 ENCOUNTER — Telehealth: Payer: Self-pay | Admitting: Family Medicine

## 2017-11-30 NOTE — Telephone Encounter (Signed)
Pt stopped by office and stated when he went to PT that was told to have [place] call something about referral for Sleep study or something that they needed from [place].  I called Azalee Course office Outpt Neuro at [facility] [phone]  & she was gone for the day & I left a message for her to call our office and let us know what they need from [place].

## 2017-12-04 ENCOUNTER — Encounter: Payer: Self-pay | Admitting: Physical Therapy

## 2017-12-04 ENCOUNTER — Ambulatory Visit: Payer: Medicare Other | Admitting: Physical Therapy

## 2017-12-04 VITALS — BP 118/72 | HR 62

## 2017-12-04 DIAGNOSIS — R262 Difficulty in walking, not elsewhere classified: Secondary | ICD-10-CM | POA: Diagnosis not present

## 2017-12-04 DIAGNOSIS — R42 Dizziness and giddiness: Secondary | ICD-10-CM | POA: Diagnosis not present

## 2017-12-04 DIAGNOSIS — H8113 Benign paroxysmal vertigo, bilateral: Secondary | ICD-10-CM | POA: Diagnosis not present

## 2017-12-04 DIAGNOSIS — R269 Unspecified abnormalities of gait and mobility: Secondary | ICD-10-CM | POA: Diagnosis not present

## 2017-12-04 DIAGNOSIS — R2681 Unsteadiness on feet: Secondary | ICD-10-CM | POA: Diagnosis not present

## 2017-12-04 DIAGNOSIS — R293 Abnormal posture: Secondary | ICD-10-CM | POA: Diagnosis not present

## 2017-12-04 NOTE — Therapy (Signed)
[facility] [address] [place], [place], [address] Phone: [phone]   Fax:  [phone]  Physical Therapy Treatment  Patient Details  Name: Corey Oneill MRN: [id number] Date of Birth: [birth year] Referring Provider: [name] MD   Encounter Date: 12/04/2017  PT End of Session - 12/04/17 0953    Visit Number  3    Number of Visits  5    Date for PT Re-Evaluation  12/28/17    Authorization Type  MCR; AARP    Authorization Time Period  11/28/17 to 01/27/2018    PT Start Time  0800    PT Stop Time  0847    PT Time Calculation (min)  47 min       Past Medical History:  Diagnosis Date  . Anginal pain ([place])   . Aortic stenosis    a. peak to peak gradient by LHC 8/16:  37 mmHg (moderately severe)   . CAD (coronary artery disease)    a.  LHC 8/16: Mid to distal LAD 30%, OM1 40%, proximal mid RCA 40%, distal RCA 60% >> FFR 0.69  >> PCI: 3 x 15 mm Resolute DES  . Depression   . Esophageal reflux   . Family history of adverse reaction to anesthesia    "daughter has PONV"  . H/O blood clots    "get them in my stool and urine" (11/12/2015)  . Heart murmur   . Hx of radiation therapy 04/14/13-06/09/13   prostate 7800 cGy, 40 sessions, seminal vesicles 5600 cGy 40 sessions  . Hyperlipidemia   . 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 Prostate cancer ([place]) dx'd 2014  . Radiation proctitis     Past Surgical History:  Procedure Laterality Date  . CARDIAC CATHETERIZATION N/A 04/21/2015   Procedure: Right/Left Heart Cath and Coronary Angiography;  Surgeon: [name], MD;  Location: [facility];  Service: Cardiovascular;  Laterality: N/A;  . CARDIAC CATHETERIZATION N/A 06/18/2015   Procedure: Intravascular Pressure Wire/FFR Study;  Surgeon: [name], MD;  Location: [facility];  Service: Cardiovascular;  Laterality: N/A;  . CARDIAC CATHETERIZATION N/A 06/18/2015   Procedure: Coronary Stent Intervention;  Surgeon: [name], MD;  Location: [facility];  Service: Cardiovascular;  Laterality: N/A;  . CARDIAC CATHETERIZATION N/A 06/18/2015   Procedure: Right/Left Heart Cath and Coronary Angiography;  Surgeon: [name], MD;  Location: [facility];  Service: Cardiovascular;  Laterality: N/A;  . CARDIAC CATHETERIZATION N/A 06/23/2015   Procedure: Left Heart Cath and Cors/Grafts Angiography;  Surgeon: [name], MD;  Location: [facility];  Service: Cardiovascular;  Laterality: N/A;  . CARDIAC CATHETERIZATION N/A 01/17/2016   Procedure: Right/Left Heart Cath and Coronary Angiography;  Surgeon: [name], MD;  Location: [facility];  Service: Cardiovascular;  Laterality: N/A;  . COLONOSCOPY    . FRACTURE SURGERY    . INGUINAL HERNIA REPAIR    . LAPAROSCOPIC APPENDECTOMY N/A 11/16/2015   Procedure: APPENDECTOMY LAPAROSCOPIC;  Surgeon: [name], MD;  Location: [facility];  Service: General;  Laterality: N/A;  . LEFT HEART CATH AND CORONARY ANGIOGRAPHY N/A 12/26/2016   Procedure: Left Heart Cath and Coronary Angiography;  Surgeon: Troy Sine, MD;  Location: [facility];  Service: Cardiovascular;  Laterality: N/A;  . MELANOMA EXCISION Right 10/24/2016   Procedure: WIDE EXCISION MELANOMA RIGHT NECK AND RIGHT CHEST TIMES 2;  Surgeon: [name], MD;  Location: [facility];  Service: General;  Laterality: Right;  right medial and lateral lesion of chest and right neck  . NASAL FRACTURE SURGERY     "broken years ago; several ORs to correct it"  . NASAL SEPTUM SURGERY    . PROSTATE BIOPSY  2014   "needle biopsy"  . TEE WITHOUT CARDIOVERSION N/A 02/15/2016   Procedure: TRANSESOPHAGEAL ECHOCARDIOGRAM (TEE);  Surgeon: [name], MD;  Location: [facility];  Service: Open Heart Surgery;  Laterality: N/A;  . TONSILLECTOMY    . TRANSCATHETER AORTIC VALVE REPLACEMENT, TRANSFEMORAL  02/15/2016  . TRANSCATHETER  AORTIC VALVE REPLACEMENT, TRANSFEMORAL N/A 02/15/2016   Procedure: TRANSCATHETER AORTIC VALVE REPLACEMENT, TRANSFEMORAL;  Surgeon: [name], MD;  Location: [facility];  Service: Open Heart Surgery;  Laterality: N/A;    Vitals:   12/04/17 0824  BP: 118/72  Pulse: 62    Subjective Assessment - 12/04/17 0945    Subjective  Pt states he is no longer taking the Meclizine - vertigo is much improved; reports if he could rid of the weird dreams it would be much better (pt is requesting to have Sleep Study done);    Pertinent History  CAD, aortic stenosis, depression, prostate cancer, melanoma    Patient Stated Goals  to not fall; feel steady on his feet    Currently in Pain?  No/denies             Vestibular Assessment - 12/04/17 0817      Positional Testing   Dix-Hallpike  Dix-Hallpike Right;Dix-Hallpike Left    Sidelying Test  Sidelying Left;Sidelying Right      Dix-Hallpike Right   Dix-Hallpike Right Duration  none    Dix-Hallpike Right Symptoms  No nystagmus      Dix-Hallpike Left   Dix-Hallpike Left Duration  none    Dix-Hallpike Left Symptoms  No nystagmus      Sidelying Right   Sidelying Right Duration  none    Sidelying Right Symptoms  No nystagmus      Sidelying Left   Sidelying Left Duration  none    Sidelying Left Symptoms  No nystagmus      Positional Sensitivities   Supine to Sitting  Lightheadedness moderate intensity    Up from Right Hallpike  Lightheadedness    Up from Left Hallpike  Lightheadedness    Positional Sensitivities Comments  pt also reported moderate light-headedness with sit from Rt & Lt sidelying positions      Orthostatics   BP supine (x 5 minutes)  120/65    HR supine (x 5 minutes)  60    BP sitting  120/68    HR sitting  63    BP standing (after 1 minute)  116/65    HR standing (after 1 minute)  66    BP standing (after 3 minutes)  123/73    HR standing (after 3 minutes)  74    Orthostatics Comment  moderate c/o light-headedness  with supine to sit rates intensity 6-7/10                   Balance Exercises - 12/04/17 0950      Balance Exercises: Standing   Standing Eyes Opened  Narrow base of support (BOS);Wide (BOA);Head turns;Foam/compliant surface;5 reps    Standing Eyes Closed  Narrow base of support (BOS);Wide (BOA);Head turns;Foam/compliant surface;5 reps        PT Education - 12/04/17 [phone]    Education provided  Yes    Education Details  Pt was instructed in seated ankle pumps upon supine to sit transfer, then marching  in place upon sit to stand transfer; also gave standing on foam with EC to incr. vestibular input in maintaining balance    Methods  Explanation;Handout    Comprehension  Verbalized understanding;Returned demonstration          PT Long Term Goals - 11/28/17 2002      PT LONG TERM GOAL #1   Title  Patient will be independent with HEP for vertigo and balance deficits. (Target for LTGs 12/13/17)    Time  2    Period  Weeks    Status  New    Target Date  12/13/17      PT LONG TERM GOAL #2   Title  Patient will have no vertigo with positional testing.     Time  2    Period  Weeks    Status  New      PT LONG TERM GOAL #3   Title  Patient will self-report at least 50% improvement in overall balance.     Time  2    Period  Weeks    Status  New            Plan - 12/04/17 8127    Clinical Impression Statement  Pt reports moderate light-headedness with supine to sit, sidelying to sitting and sit to stand transfers - symptoms consistent with orthostatic hypotension, however, BP recordings in orthostatic assessment do not indicate that pt is having orthostatic hypotension with change in positions.  Pt appears to have vestibular hypofunction as evidenced by c/o dysequilibrium with increased sway with standing on compliant surface.  BPPV appears to be resolved, symptoms of vestibular hypofunction contributing now to unsteadiness.                                                                                          Rehab Potential  Fair    Clinical Impairments Affecting Rehab Potential  limited cervical ROM;     PT Frequency  2x / week    PT Duration  2 weeks    PT Treatment/Interventions  ADLs/Self Care Home Management;Canalith Repostioning;Gait training;Functional mobility training;Therapeutic activities;Therapeutic exercise;Balance training;Neuromuscular re-education;Manual techniques;Patient/family education;Passive range of motion;Visual/perceptual remediation/compensation;Vestibular    PT Next Visit Plan  Pt only has 1 more appt scheduled - assess LTG's and determine D/C vs renewal; if renew, SOT? pt may benefit from few additional sessions to address vestibular hypofunction/ balance & gait on uneven surfaces    PT Home Exercise Plan  instructed in ankle pumps and marching in place to assist in decreasing light-headedness with change in position; added standing on foam with EC on 12-04-17    Consulted and Agree with Plan of Care  Patient       Patient will benefit from skilled therapeutic intervention in order to improve the following deficits and impairments:  Abnormal gait, Decreased activity tolerance, Decreased balance, Decreased range of motion, Dizziness, Postural dysfunction  Visit Diagnosis: Unsteadiness on feet  BPPV (benign paroxysmal positional vertigo), bilateral     Problem List Patient  Active Problem List   Diagnosis Date Noted  . Radiation proctitis   . Hx of radiation therapy   . Heart murmur   . H/O blood clots   . Family history of adverse reaction to anesthesia   . CAD (coronary artery disease)   . Aortic stenosis   . Anginal pain ([place])   . History of ITP 02/26/2017  . Malignant melanoma of neck ([place]) 10/02/2016  . Malignant melanoma in situ ([facility]) 09/04/2016  . 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 S/P appendectomy 11/12/2015  . Coronary artery disease   . Esophageal reflux 03/22/2015  .  Depression 11/20/2014  . Prostate cancer ([place]) 03/05/2013  . Erectile dysfunction   . Hyperlipidemia 06/07/2007    Alda Lea, PT 12/04/2017, 10:06 AM  [facility] [address] [place], [place], [address] Phone: [phone]   Fax:  [phone]  Name: Corey Oneill MRN: [id number] Date of Birth: 1936-04-14

## 2017-12-04 NOTE — Patient Instructions (Addendum)
Feet Apart (Compliant Surface) Arm Motion - Eyes Closed    Stand on compliant surface: __PILLOW ______, feet shoulder width apart. Close eyes and move arms up and down: to front. Repeat _10___ times per session. Do _1___ sessions per day. (DO NOT HAVE TO DO THE ARM MOTIONS)  DO head turns side to side and up/down 8-10 reps each to increase difficulty    EXERCISES/TECHNIQUES to HELP with LIGHT-HEADEDNESS   Do ankle pumps in seated position - 10 times each leg - after transferring from lying down to sitting up (BEFORE STANDING UP);     Upon initial standing - do marching in place - 10-15 times each leg before starting to walk

## 2017-12-07 ENCOUNTER — Ambulatory Visit: Payer: Medicare Other | Admitting: Physical Therapy

## 2017-12-07 ENCOUNTER — Encounter: Payer: Self-pay | Admitting: Physical Therapy

## 2017-12-07 DIAGNOSIS — R269 Unspecified abnormalities of gait and mobility: Secondary | ICD-10-CM

## 2017-12-07 DIAGNOSIS — R42 Dizziness and giddiness: Secondary | ICD-10-CM | POA: Diagnosis not present

## 2017-12-07 DIAGNOSIS — R2681 Unsteadiness on feet: Secondary | ICD-10-CM | POA: Diagnosis not present

## 2017-12-07 DIAGNOSIS — R293 Abnormal posture: Secondary | ICD-10-CM | POA: Diagnosis not present

## 2017-12-07 DIAGNOSIS — H8113 Benign paroxysmal vertigo, bilateral: Secondary | ICD-10-CM | POA: Diagnosis not present

## 2017-12-07 DIAGNOSIS — R262 Difficulty in walking, not elsewhere classified: Secondary | ICD-10-CM | POA: Diagnosis not present

## 2017-12-07 NOTE — Patient Instructions (Signed)
Bending / Picking Up Objects    Sitting, slowly bend head down and reach to the floor. Return to upright position. Hold position until symptoms subside. Repeat ___5-10_ times per session. Do __2-3__ sessions per day.  Copyright  VHI. All rights reserved.    Feet Together (Compliant Surface) Head Motion - Eyes Open    With eyes open, standing on pillows: feet together, move head slowly: right and left x 10 (looking at targets on each side), then up and down x 10 (with targets) Repeat __1__ times per session. Do __1__ sessions per day.  Then close eyes and hold for 30 seconds. Attempt 3 times.   Copyright  VHI. All rights reserved.

## 2017-12-08 NOTE — Therapy (Signed)
[facility] [address] [place], [place], [address] Phone: [phone]   Fax:  [phone]  Physical Therapy Treatment  Patient Details  Name: Corey Oneill MRN: [id number] Date of Birth: [birth year] Referring Provider: [name] MD   Encounter Date: 12/07/2017  PT End of Session - 12/08/17 1202    Visit Number  4    Number of Visits  13 see recertification    Date for PT Re-Evaluation  02/05/18    Authorization Type  MCR; AARP    Authorization Time Period  11/28/17 to 01/27/2018; 12/07/17 to 02/05/18    PT Start Time  0937    PT Stop Time  1015    PT Time Calculation (min)  38 min    Activity Tolerance  Patient tolerated treatment well    Behavior During Therapy  [facility] for tasks assessed/performed       Past Medical History:  Diagnosis Date  . Anginal pain ([place])   . Aortic stenosis    a. peak to peak gradient by LHC 8/16:  37 mmHg (moderately severe)   . CAD (coronary artery disease)    a.  LHC 8/16: Mid to distal LAD 30%, OM1 40%, proximal mid RCA 40%, distal RCA 60% >> FFR 0.69  >> PCI: 3 x 15 mm Resolute DES  . Depression   . Esophageal reflux   . Family history of adverse reaction to anesthesia    "daughter has PONV"  . H/O blood clots    "get them in my stool and urine" (11/12/2015)  . Heart murmur   . Hx of radiation therapy 04/14/13-06/09/13   prostate 7800 cGy, 40 sessions, seminal vesicles 5600 cGy 40 sessions  . Hyperlipidemia   . Malignant melanoma in situ ([facility]) 09/04/2016   Right neck and chest  . Prostate cancer ([facility] [place]) dx'd 2014  . Radiation proctitis     Past Surgical History:  Procedure Laterality Date  . CARDIAC CATHETERIZATION N/A 04/21/2015   Procedure: Right/Left Heart Cath and Coronary Angiography;  Surgeon: [name], MD;  Location: [facility];  Service: Cardiovascular;  Laterality: N/A;  . CARDIAC CATHETERIZATION N/A 06/18/2015   Procedure: Intravascular Pressure Wire/FFR  Study;  Surgeon: [name], MD;  Location: [facility];  Service: Cardiovascular;  Laterality: N/A;  . CARDIAC CATHETERIZATION N/A 06/18/2015   Procedure: Coronary Stent Intervention;  Surgeon: [name], MD;  Location: [facility];  Service: Cardiovascular;  Laterality: N/A;  . CARDIAC CATHETERIZATION N/A 06/18/2015   Procedure: Right/Left Heart Cath and Coronary Angiography;  Surgeon: [name], MD;  Location: [facility];  Service: Cardiovascular;  Laterality: N/A;  . CARDIAC CATHETERIZATION N/A 06/23/2015   Procedure: Left Heart Cath and Cors/Grafts Angiography;  Surgeon: [name], MD;  Location: [facility];  Service: Cardiovascular;  Laterality: N/A;  . CARDIAC CATHETERIZATION N/A 01/17/2016   Procedure: Right/Left Heart Cath and Coronary Angiography;  Surgeon: [name], MD;  Location: [facility];  Service: Cardiovascular;  Laterality: N/A;  . COLONOSCOPY    . FRACTURE SURGERY    . INGUINAL HERNIA REPAIR    . LAPAROSCOPIC APPENDECTOMY N/A 11/16/2015   Procedure: APPENDECTOMY LAPAROSCOPIC;  Surgeon: [name], MD;  Location: [facility];  Service: General;  Laterality: N/A;  . LEFT HEART CATH AND CORONARY ANGIOGRAPHY N/A 12/26/2016   Procedure: Left Heart Cath and Coronary Angiography;  Surgeon: Troy Sine, MD;  Location: [facility];  Service: Cardiovascular;  Laterality: N/A;  . MELANOMA EXCISION Right 10/24/2016   Procedure: WIDE EXCISION MELANOMA RIGHT NECK AND RIGHT CHEST TIMES 2;  Surgeon: [name], MD;  Location: [facility];  Service: General;  Laterality: Right;  right medial and lateral lesion of chest and right neck  . NASAL FRACTURE SURGERY     "broken years ago; several ORs to correct it"  . NASAL SEPTUM SURGERY    . PROSTATE BIOPSY  2014   "needle biopsy"  . TEE WITHOUT CARDIOVERSION N/A 02/15/2016   Procedure: TRANSESOPHAGEAL ECHOCARDIOGRAM (TEE);  Surgeon: [name], MD;  Location: [facility];   Service: Open Heart Surgery;  Laterality: N/A;  . TONSILLECTOMY    . TRANSCATHETER AORTIC VALVE REPLACEMENT, TRANSFEMORAL  02/15/2016  . TRANSCATHETER AORTIC VALVE REPLACEMENT, TRANSFEMORAL N/A 02/15/2016   Procedure: TRANSCATHETER AORTIC VALVE REPLACEMENT, TRANSFEMORAL;  Surgeon: [name], MD;  Location: [facility];  Service: Open Heart Surgery;  Laterality: N/A;    There were no vitals filed for this visit.  Subjective Assessment - 12/07/17 0940    Subjective  Reports he has been doing the walking exercise (head turns) and is very wobbly. has not done the corner exercise yet. "I know I'm not going to get better if I don't do it... so I'm going to get it done this time. "    Pertinent History  CAD, aortic stenosis, depression, prostate cancer, melanoma    Patient Stated Goals  to not fall; feel steady on his feet    Currently in Pain?  No/denies                      [facility] Adult PT Treatment/Exercise - 12/07/17 1019      Ambulation/Gait   Ambulation/Gait Assistance  5: Supervision    Ambulation/Gait Assistance Details  supervision as moving througout gym due to feeling unsteady and slightly lighheaded due to exercises    Assistive device  None    Gait Pattern  Step-through pattern;Decreased stride length;Decreased trunk rotation;Wide base of support    Ambulation Surface  Level      Vestibular Treatment/Exercise - 12/07/17 1021      Vestibular Treatment/Exercise   Habituation Exercises  -- seated forward bend reach to floor x 5; mild dizzyiness    Gaze Exercises  X1 Viewing Horizontal;X1 Viewing Vertical      X1 Viewing Horizontal   Foot Position  seated    Time  -- 30 sec    Reps  2    Comments  no symptoms      X1 Viewing Vertical   Foot Position  seated    Time  -- 30 sec    Reps  1    Comments  no symptoms         Balance Exercises - 12/08/17 1157      Balance Exercises: Standing   Standing Eyes Opened  Narrow base of support (BOS);Head  turns;Foam/compliant surface 10 reps each direction    Standing Eyes Closed  Narrow base of support (BOS);Foam/compliant surface;30 secs attempted x 3, successful to 30 sec x 1    Tandem Gait  Forward;3 reps    Retro Gait  Upper extremity support;3 reps at counter    Turning  Right;Left;3 reps;5 reps in // bars; 2 sets; all rt turns, then all lt (rt more diff)        PT Education - 12/08/17 1201    Education provided  Yes  Education Details  see HEP; potential hypofunction s/p BPPV continuing to cause symptoms and need to add additional visits    Person(s) Educated  Patient    Methods  Explanation;Demonstration;Handout    Comprehension  Verbalized understanding;Returned demonstration;Need further instruction          PT Long Term Goals - 12/08/17 1212      PT LONG TERM GOAL #1   Title  Patient will be independent with HEP for vertigo and balance deficits. (Target for LTGs 12/13/17) (TARGET for unmet or new LTGs 01/06/18)    Baseline  doing parts of HEP, has not yet set up corner exercises for home    Time  2    Period  Weeks    Status  Partially Met    Target Date  01/06/18      PT LONG TERM GOAL #2   Title  Patient will have no vertigo with positional testing.     Time  2    Period  Weeks    Status  Achieved      PT LONG TERM GOAL #3   Title  Patient will self-report at least 50% improvement in overall balance.     Baseline  12/07/17 pt reports 25% improvement    Time  2    Period  Weeks    Status  Partially Met      PT LONG TERM GOAL #4   Title  Patient will complete DHI and set goal for LTG as appropriate.    Time  1    Period  Weeks    Status  New            Plan - 12/08/17 1206    Clinical Impression Statement  Session focused on assessing goals (met 1 of 3 and partially met remaining 2 goals) and updating HEP for balance and vestibular hypofunction. Patient agreeable to additional appointments to continue to address balance/vestibular deficits.      Rehab Potential  Fair    Clinical Impairments Affecting Rehab Potential  limited cervical ROM;     PT Frequency  2x / week    PT Duration  4 weeks    PT Treatment/Interventions  ADLs/Self Care Home Management;Canalith Repostioning;Gait training;Functional mobility training;Therapeutic activities;Therapeutic exercise;Balance training;Neuromuscular re-education;Manual techniques;Patient/family education;Passive range of motion;Visual/perceptual remediation/compensation;Vestibular;DME Instruction    PT Next Visit Plan  Check tolerance for HEP added 1/25; have pt do [place] and set goal as approp; try VOR in standing (?add to HEP); add 1/2 turns to HEP; continue work on compliant surfaces, walking with head turns    PT Home Exercise Plan  instructed in ankle pumps and marching in place to assist in decreasing light-headedness with change in position; added standing on foam with EC on 12-04-17    Consulted and Agree with Plan of Care  Patient       Patient will benefit from skilled therapeutic intervention in order to improve the following deficits and impairments:  Abnormal gait, Decreased activity tolerance, Decreased balance, Decreased range of motion, Dizziness, Postural dysfunction, Decreased mobility  Visit Diagnosis: Unsteadiness on feet - Plan: PT plan of care cert/re-cert  Difficulty in walking, not elsewhere classified - Plan: PT plan of care cert/re-cert  Dizziness and giddiness - Plan: PT plan of care cert/re-cert  Abnormality of gait - Plan: PT plan of care cert/re-cert  Abnormal posture - Plan: PT plan of care cert/re-cert     Problem List Patient Active Problem List   Diagnosis Date Noted  .  Radiation proctitis   . Hx of radiation therapy   . Heart murmur   . H/O blood clots   . Family history of adverse reaction to anesthesia   . CAD (coronary artery disease)   . Aortic stenosis   . Anginal pain ([place])   . History of ITP 02/26/2017  . Malignant melanoma of neck ([place])  10/02/2016  . Malignant melanoma in situ ([place]) 09/04/2016  . 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 S/P appendectomy 11/12/2015  . Coronary artery disease   . Esophageal reflux 03/22/2015  . Depression 11/20/2014  . Prostate cancer ([place]) 03/05/2013  . Erectile dysfunction   . Hyperlipidemia 06/07/2007    Rexanne Mano, PT 12/08/2017, 12:24 PM  [facility] [address] [place], [place], [address] Phone: [phone]   Fax:  [phone]  Name: Corey Oneill MRN: [id number] Date of Birth: 1936/05/29

## 2017-12-10 ENCOUNTER — Ambulatory Visit: Payer: Medicare Other | Admitting: Physical Therapy

## 2017-12-13 ENCOUNTER — Ambulatory Visit: Payer: Medicare Other | Admitting: Physical Therapy

## 2017-12-13 ENCOUNTER — Encounter: Payer: Self-pay | Admitting: Physical Therapy

## 2017-12-13 DIAGNOSIS — H8113 Benign paroxysmal vertigo, bilateral: Secondary | ICD-10-CM | POA: Diagnosis not present

## 2017-12-13 DIAGNOSIS — R42 Dizziness and giddiness: Secondary | ICD-10-CM | POA: Diagnosis not present

## 2017-12-13 DIAGNOSIS — R293 Abnormal posture: Secondary | ICD-10-CM | POA: Diagnosis not present

## 2017-12-13 DIAGNOSIS — R262 Difficulty in walking, not elsewhere classified: Secondary | ICD-10-CM | POA: Diagnosis not present

## 2017-12-13 DIAGNOSIS — R2681 Unsteadiness on feet: Secondary | ICD-10-CM | POA: Diagnosis not present

## 2017-12-13 DIAGNOSIS — R269 Unspecified abnormalities of gait and mobility: Secondary | ICD-10-CM | POA: Diagnosis not present

## 2017-12-13 NOTE — Therapy (Signed)
[facility] [address] [place], [place], [address] Phone: [phone]   Fax:  [phone]  Physical Therapy Treatment  Patient Details  Name: Corey Oneill MRN: [id number] Date of Birth: Oct 26, 1936 Referring Provider: [name] MD   Encounter Date: 12/13/2017  PT End of Session - 12/13/17 1204    Visit Number  5    Number of Visits  13    Date for PT Re-Evaluation  02/05/18    Authorization Type  MCR; AARP    Authorization Time Period  11/28/17 to 01/27/2018; 12/07/17 to 02/05/18    PT Start Time  0805    PT Stop Time  0846    PT Time Calculation (min)  41 min       Past Medical History:  Diagnosis Date  . Anginal pain ([place])   . Aortic stenosis    a. peak to peak gradient by LHC 8/16:  37 mmHg (moderately severe)   . CAD (coronary artery disease)    a.  LHC 8/16: Mid to distal LAD 30%, OM1 40%, proximal mid RCA 40%, distal RCA 60% >> FFR 0.69  >> PCI: 3 x 15 mm Resolute DES  . Depression   . Esophageal reflux   . Family history of adverse reaction to anesthesia    "daughter has PONV"  . H/O blood clots    "get them in my stool and urine" (11/12/2015)  . Heart murmur   . Hx of radiation therapy 04/14/13-06/09/13   prostate 7800 cGy, 40 sessions, seminal vesicles 5600 cGy 40 sessions  . Hyperlipidemia   . Malignant melanoma in situ ([place]) 09/04/2016   Right neck and chest  . Prostate cancer ([place]) dx'd 2014  . Radiation proctitis     Past Surgical History:  Procedure Laterality Date  . CARDIAC CATHETERIZATION N/A 04/21/2015   Procedure: Right/Left Heart Cath and Coronary Angiography;  Surgeon: [name], MD;  Location: [facility];  Service: Cardiovascular;  Laterality: N/A;  . CARDIAC CATHETERIZATION N/A 06/18/2015   Procedure: Intravascular Pressure Wire/FFR Study;  Surgeon: [name], MD;  Location: [facility];  Service: Cardiovascular;  Laterality: N/A;  . CARDIAC CATHETERIZATION  N/A 06/18/2015   Procedure: Coronary Stent Intervention;  Surgeon: [name], MD;  Location: [facility];  Service: Cardiovascular;  Laterality: N/A;  . CARDIAC CATHETERIZATION N/A 06/18/2015   Procedure: Right/Left Heart Cath and Coronary Angiography;  Surgeon: [name], MD;  Location: [facility];  Service: Cardiovascular;  Laterality: N/A;  . CARDIAC CATHETERIZATION N/A 06/23/2015   Procedure: Left Heart Cath and Cors/Grafts Angiography;  Surgeon: [name], MD;  Location: [facility];  Service: Cardiovascular;  Laterality: N/A;  . CARDIAC CATHETERIZATION N/A 01/17/2016   Procedure: Right/Left Heart Cath and Coronary Angiography;  Surgeon: [name], MD;  Location: [facility];  Service: Cardiovascular;  Laterality: N/A;  . COLONOSCOPY    . FRACTURE SURGERY    . INGUINAL HERNIA REPAIR    . LAPAROSCOPIC APPENDECTOMY N/A 11/16/2015   Procedure: APPENDECTOMY LAPAROSCOPIC;  Surgeon: [name], MD;  Location: [facility];  Service: General;  Laterality: N/A;  . LEFT HEART CATH AND CORONARY ANGIOGRAPHY N/A 12/26/2016   Procedure: Left Heart Cath and Coronary Angiography;  Surgeon: Troy Sine, MD;  Location: [facility];  Service: Cardiovascular;  Laterality: N/A;  . MELANOMA EXCISION Right 10/24/2016   Procedure: WIDE EXCISION MELANOMA RIGHT NECK AND  RIGHT CHEST TIMES 2;  Surgeon: Thomas Cornett, MD;  Location: [facility];  Service: General;  Laterality: Right;  right medial and lateral lesion of chest and right neck  . NASAL FRACTURE SURGERY     "broken years ago; several ORs to correct it"  . NASAL SEPTUM SURGERY    . PROSTATE BIOPSY  2014   "needle biopsy"  . TEE WITHOUT CARDIOVERSION N/A 02/15/2016   Procedure: TRANSESOPHAGEAL ECHOCARDIOGRAM (TEE);  Surgeon: Michael Cooper, MD;  [facility];  Service: Open Heart Surgery;  Laterality: N/A;  . TONSILLECTOMY    . TRANSCATHETER AORTIC VALVE REPLACEMENT, TRANSFEMORAL  02/15/2016  .  TRANSCATHETER AORTIC VALVE REPLACEMENT, TRANSFEMORAL N/A 02/15/2016   Procedure: TRANSCATHETER AORTIC VALVE REPLACEMENT, TRANSFEMORAL;  Surgeon: Michael Cooper, MD;  Location: [facility];  Service: Open Heart Surgery;  Laterality: N/A;    There were no vitals filed for this visit.  Subjective Assessment - 12/13/17 1157    Subjective  Pt reports he had brief episode of vertigo last night but it only lasted a few seconds; states overall vertigo is much improved    Pertinent History  CAD, aortic stenosis, depression, prostate cancer, melanoma    Patient Stated Goals  to not fall; feel steady on his feet    Currently in Pain?  No/denies                      [facility] Adult PT Treatment/Exercise - 12/13/17 0823      Transfers   Sit to Stand  4: Min guard;4: Min assist    Number of Reps  Other reps (comment) 5    Comments  performed on blue balance beam without UE support          Balance Exercises - 12/13/17 1200      Balance Exercises: Standing   Standing Eyes Opened  Narrow base of support (BOS);Head turns;Foam/compliant surface;Wide (BOA);5 reps horizontal and vertical head turns    Standing Eyes Closed  Narrow base of support (BOS);Wide (BOA);Head turns;Foam/compliant surface;5 reps horizontal and vertical head turns    Tandem Gait  Forward;1 rep 10' with CGA    Other Standing Exercises  Pt performed marching on incline 10 reps; added horizontal and vertical head turns 5 reps each direction; alternate stepping up/back and then down back on incline and on decline - added 1 head turns with the stepping       Pt performed marching forward 20', amb. With head turns side to side and up/down; tandem gait 10' and walking  backwards 10' with CGA  Pt performed standing on pillow exercises in corner - with feet apart, feet together with EO and EC with horizontal and Vertical head turns 5 reps each    PT Education - 12/13/17 1203    Education provided  Yes    Education Details  pt  was given sheet with balance on foam - EO and EC with feet apart and feet together    Person(s) Educated  Patient    Methods  Explanation;Demonstration;Handout    Comprehension  Verbalized understanding;Returned demonstration          PT Long Term Goals - 12/08/17 1212      PT LONG TERM GOAL #1   Title  Patient will be independent with HEP for vertigo and balance deficits. (Target for LTGs 12/13/17) (TARGET for unmet or new LTGs 01/06/18)    Baseline  doing parts of HEP, has not yet set up corner exercises   for home    Time  2    Period  Weeks    Status  Partially Met    Target Date  01/06/18      PT LONG TERM GOAL #2   Title  Patient will have no vertigo with positional testing.     Time  2    Period  Weeks    Status  Achieved      PT LONG TERM GOAL #3   Title  Patient will self-report at least 50% improvement in overall balance.     Baseline  12/07/17 pt reports 25% improvement    Time  2    Period  Weeks    Status  Partially Met      PT LONG TERM GOAL #4   Title  Patient will complete DHI and set goal for LTG as appropriate.    Time  1    Period  Weeks    Status  New            Plan - 12/13/17 1204    Clinical Impression Statement  Pt reported feeling off balance and demonstrated some minimal sway after performing balance exercises on incline/decline with head turns, indicative of vestibular hypofunction.  Overall balance on solid surface without head turns is good.      Rehab Potential  Good    Clinical Impairments Affecting Rehab Potential  limited cervical ROM;     PT Frequency  2x / week    PT Duration  4 weeks    PT Treatment/Interventions  ADLs/Self Care Home Management;Canalith Repostioning;Gait training;Functional mobility training;Therapeutic activities;Therapeutic exercise;Balance training;Neuromuscular re-education;Manual techniques;Patient/family education;Passive range of motion;Visual/perceptual remediation/compensation;Vestibular;DME Instruction     PT Next Visit Plan  cont balance exercises - check balance on foam given for HEP - do DHI and set goal as appropriate    PT Home Exercise Plan  instructed in ankle pumps and marching in place to assist in decreasing light-headedness with change in position; added standing on foam with EC on 12-04-17; gave sheet with balance on foam (4 positions) for HEP    Consulted and Agree with Plan of Care  Patient       Patient will benefit from skilled therapeutic intervention in order to improve the following deficits and impairments:  Abnormal gait, Decreased activity tolerance, Decreased balance, Decreased range of motion, Dizziness, Postural dysfunction, Decreased mobility  Visit Diagnosis: Dizziness and giddiness  Unsteadiness on feet     Problem List Patient Active Problem List   Diagnosis Date Noted  . Radiation proctitis   . Hx of radiation therapy   . Heart murmur   . H/O blood clots   . Family history of adverse reaction to anesthesia   . CAD (coronary artery disease)   . Aortic stenosis   . Anginal pain ([facility])   . History of ITP 02/26/2017  . 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 Malignant melanoma in situ ([facility]) 09/04/2016  . 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 S/P appendectomy 11/12/2015  . Coronary artery disease   . Esophageal reflux 03/22/2015  . Depression 11/20/2014  . Prostate cancer ([facility]) 03/05/2013  . Erectile dysfunction   . Hyperlipidemia 06/07/2007    ,  Suzanne, PT 12/13/2017, 12:14 PM  [facility] [address] [place], [place], [address] Phone: [phone]   Fax:  [phone]  Name: Detrell N Radice MRN: [id number] Date of Birth: 06/27/1936   

## 2017-12-13 NOTE — Patient Instructions (Signed)
Feet Apart, Head Motion - Eyes Open    With eyes open, feet apart, move head slowly: up and down. Repeat _10___ times per session. Do _1___ sessions per day.  Copyright  VHI. All rights reserved.

## 2017-12-17 ENCOUNTER — Ambulatory Visit: Payer: Medicare Other | Attending: Family Medicine | Admitting: Physical Therapy

## 2017-12-17 ENCOUNTER — Encounter: Payer: Self-pay | Admitting: Physical Therapy

## 2017-12-17 DIAGNOSIS — H8113 Benign paroxysmal vertigo, bilateral: Secondary | ICD-10-CM | POA: Diagnosis not present

## 2017-12-17 DIAGNOSIS — R2681 Unsteadiness on feet: Secondary | ICD-10-CM | POA: Diagnosis not present

## 2017-12-17 DIAGNOSIS — R262 Difficulty in walking, not elsewhere classified: Secondary | ICD-10-CM | POA: Diagnosis not present

## 2017-12-17 DIAGNOSIS — R42 Dizziness and giddiness: Secondary | ICD-10-CM | POA: Diagnosis not present

## 2017-12-17 NOTE — Therapy (Signed)
[facility] [address] [place], [place], [address] Phone: [phone]   Fax:  [phone]  Physical Therapy Treatment  Patient Details  Name: Corey Oneill MRN: [id number] Date of Birth: 04-Jul-1936 Referring Provider: [name] MD   Encounter Date: 12/17/2017  PT End of Session - 12/17/17 1643    Visit Number  6    Number of Visits  13    Date for PT Re-Evaluation  02/05/18    Authorization Type  MCR; AARP    Authorization Time Period  11/28/17 to 01/27/2018; 12/07/17 to 02/05/18    PT Start Time  1316    PT Stop Time  1401    PT Time Calculation (min)  45 min       Past Medical History:  Diagnosis Date  . Anginal pain ([facility])   . Aortic stenosis    a. peak to peak gradient by LHC 8/16:  37 mmHg (moderately severe)   . CAD (coronary artery disease)    a.  LHC 8/16: Mid to distal LAD 30%, OM1 40%, proximal mid RCA 40%, distal RCA 60% >> FFR 0.69  >> PCI: 3 x 15 mm Resolute DES  . Depression   . Esophageal reflux   . Family history of adverse reaction to anesthesia    "daughter has PONV"  . H/O blood clots    "get them in my stool and urine" (11/12/2015)  . Heart murmur   . Hx of radiation therapy 04/14/13-06/09/13   prostate 7800 cGy, 40 sessions, seminal vesicles 5600 cGy 40 sessions  . Hyperlipidemia   . Malignant melanoma in situ ([facility]) 09/04/2016   Right neck and chest  . Prostate cancer ([facility]) dx'd 2014  . Radiation proctitis     Past Surgical History:  Procedure Laterality Date  . CARDIAC CATHETERIZATION N/A 04/21/2015   Procedure: Right/Left Heart Cath and Coronary Angiography;  Surgeon: [name], MD;  Location: [facility];  Service: Cardiovascular;  Laterality: N/A;  . CARDIAC CATHETERIZATION N/A 06/18/2015   Procedure: Intravascular Pressure Wire/FFR Study;  Surgeon: [name], MD;  Location: [facility];  Service: Cardiovascular;  Laterality: N/A;  . CARDIAC CATHETERIZATION  N/A 06/18/2015   Procedure: Coronary Stent Intervention;  Surgeon: [name], MD;  Location: [facility];  Service: Cardiovascular;  Laterality: N/A;  . CARDIAC CATHETERIZATION N/A 06/18/2015   Procedure: Right/Left Heart Cath and Coronary Angiography;  Surgeon: [name], MD;  Location: [facility];  Service: Cardiovascular;  Laterality: N/A;  . CARDIAC CATHETERIZATION N/A 06/23/2015   Procedure: Left Heart Cath and Cors/Grafts Angiography;  Surgeon: [name], MD;  Location: [facility];  Service: Cardiovascular;  Laterality: N/A;  . CARDIAC CATHETERIZATION N/A 01/17/2016   Procedure: Right/Left Heart Cath and Coronary Angiography;  Surgeon: [name], MD;  Location: [facility];  Service: Cardiovascular;  Laterality: N/A;  . COLONOSCOPY    . FRACTURE SURGERY    . INGUINAL HERNIA REPAIR    . LAPAROSCOPIC APPENDECTOMY N/A 11/16/2015   Procedure: APPENDECTOMY LAPAROSCOPIC;  Surgeon: [name], MD;  Location: [facility];  Service: General;  Laterality: N/A;  . LEFT HEART CATH AND CORONARY ANGIOGRAPHY N/A 12/26/2016   Procedure: Left Heart Cath and Coronary Angiography;  Surgeon: Troy Sine, MD;  Location: [facility];  Service: Cardiovascular;  Laterality: N/A;  . MELANOMA EXCISION Right 10/24/2016   Procedure: WIDE EXCISION MELANOMA RIGHT NECK AND  RIGHT CHEST TIMES 2;  Surgeon: [name], MD;  Location: [facility];  Service: General;  Laterality: Right;  right medial and lateral lesion of chest and right neck  . NASAL FRACTURE SURGERY     "broken years ago; several ORs to correct it"  . NASAL SEPTUM SURGERY    . PROSTATE BIOPSY  2014   "needle biopsy"  . TEE WITHOUT CARDIOVERSION N/A 02/15/2016   Procedure: TRANSESOPHAGEAL ECHOCARDIOGRAM (TEE);  Surgeon: [name], MD;  Location: [facility];  Service: Open Heart Surgery;  Laterality: N/A;  . TONSILLECTOMY    . TRANSCATHETER AORTIC VALVE REPLACEMENT, TRANSFEMORAL  02/15/2016  .  TRANSCATHETER AORTIC VALVE REPLACEMENT, TRANSFEMORAL N/A 02/15/2016   Procedure: TRANSCATHETER AORTIC VALVE REPLACEMENT, TRANSFEMORAL;  Surgeon: [name], MD;  Location: [facility];  Service: Open Heart Surgery;  Laterality: N/A;    There were no vitals filed for this visit.  Subjective Assessment - 12/17/17 1628    Subjective  Pt states he feels a little off today - doesn't really know why:  reports he had a little vertigo this morning but not really the spinning sensation like it was    Pertinent History  CAD, aortic stenosis, depression, prostate cancer, melanoma    Patient Stated Goals  to not fall; feel steady on his feet    Currently in Pain?  No/denies                      [facility] Adult PT Treatment/Exercise - 12/17/17 1326      Ambulation/Gait   Ambulation/Gait  Yes    Ambulation/Gait Assistance  5: Supervision    Ambulation/Gait Assistance Details  pt reported feeling off balance with exercises on compliant surfaces    Gait Pattern  Within Functional Limits    Ambulation Surface  Level;Indoor    Gait Comments  Pt amb. 35' tossing ball with CGA for reactive balance reactions:  amb. making circles clockwise and counterclockwise circles 30'x 1 rep each direction          Balance Exercises - 12/17/17 1639      Balance Exercises: Standing   Standing Eyes Opened  Wide (BOA);Head turns;Foam/compliant surface    Standing Eyes Closed  Wide (BOA);Head turns;Foam/compliant surface    Tandem Stance  Eyes open;Foam/compliant surface;2 reps each foot position - 10 sec hold    Rockerboard  Anterior/posterior;EO;Head turns;EC;10 reps;Intermittent UE support with CGA    Turning  Right;Both;3 reps walking 30' on track and turning around/stopping - CGA    Other Standing Exercises  Pt performed forward and backward kicks standing on blue mat surface - 10 reps each leg alternating LE's      Pt performed sit to stand with step and pivot turn 5 times toward each side - with CGA;  pt reported dizziness was increasing on  Rep #4 and 5  Pt am. 40' with horizontal head turns, 31' with vertical head turns with CGA Amb. With no head turns 40' to allow dizziness to settle - with SBA  Pt performed stepping over and back of blue balance beam 5 reps without UE support - for improved standing balance and SLS - SBA for safety       PT Long Term Goals - 12/17/17 1630      PT LONG TERM GOAL #1   Title  Patient will be independent with HEP for vertigo and balance deficits. (Target for LTGs 12/13/17) (TARGET for unmet or new LTGs 01/06/18)  PT LONG TERM GOAL #2   Title  Patient will have no vertigo with positional testing.       PT LONG TERM GOAL #3   Title  Patient will self-report at least 50% improvement in overall balance.       PT LONG TERM GOAL #4   Title  Patient will complete DHI and set goal for LTG as appropriate.    Baseline  DHI given to pt on 12-17-17 to complete and bring to 12-21-17 appt            Plan - 12/17/17 1643    Clinical Impression Statement  Pt is progressing well, however, he reports feeling unsteady with dysequilibrium after performing balance exercises on compliant surfaces - consistent with vestibular hypofunction; pt reports symptoms settle after short rest breaks Pt's c/o at this time appears to be more imbalance/dysequilibrium than true vertigo at this time    Rehab Potential  Good    Clinical Impairments Affecting Rehab Potential  limited cervical ROM;     PT Frequency  2x / week    PT Duration  4 weeks    PT Treatment/Interventions  ADLs/Self Care Home Management;Canalith Repostioning;Gait training;Functional mobility training;Therapeutic activities;Therapeutic exercise;Balance training;Neuromuscular re-education;Manual techniques;Patient/family education;Passive range of motion;Visual/perceptual remediation/compensation;Vestibular;DME Instruction    PT Next Visit Plan  calculate DHI score (pt was given this on 12-17-17);  continue  balance/vestibular exercises (do FGA or DGI?)    PT Home Exercise Plan  instructed in ankle pumps and marching in place to assist in decreasing light-headedness with change in position; added standing on foam with EC on 12-04-17; gave sheet with balance on foam (4 positions) for HEP    Consulted and Agree with Plan of Care  Patient       Patient will benefit from skilled therapeutic intervention in order to improve the following deficits and impairments:  Abnormal gait, Decreased activity tolerance, Decreased balance, Decreased range of motion, Dizziness, Postural dysfunction, Decreased mobility  Visit Diagnosis: Unsteadiness on feet     Problem List Patient Active Problem List   Diagnosis Date Noted  . Radiation proctitis   . Hx of radiation therapy   . Heart murmur   . H/O blood clots   . Family history of adverse reaction to anesthesia   . CAD (coronary artery disease)   . Aortic stenosis   . Anginal pain ([place])   . History of ITP 02/26/2017  . Malignant melanoma of neck ([facility]) 10/02/2016  . Malignant melanoma in situ ([place]) 09/04/2016  . 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 S/P appendectomy 11/12/2015  . Coronary artery disease   . Esophageal reflux 03/22/2015  . Depression 11/20/2014  . Prostate cancer ([place]) 03/05/2013  . Erectile dysfunction   . Hyperlipidemia 06/07/2007    Alda Lea, PT 12/17/2017, 4:52 PM  [facility] [address] [place], [place], [address] Phone: [phone]   Fax:  [phone]  Name: Corey Oneill MRN: [id number] Date of Birth: [birth year]

## 2017-12-18 ENCOUNTER — Ambulatory Visit: Payer: Medicare Other | Admitting: Physical Therapy

## 2017-12-20 NOTE — Telephone Encounter (Signed)
I dont see any thing on a sleep study in the referrals about a sleep study. Last office note mainly talks about Pt dizziness . Only referral that was mentioned was the physical therapy. [place]

## 2017-12-20 NOTE — Telephone Encounter (Signed)
Sending to [facility] for follow up

## 2017-12-21 ENCOUNTER — Encounter: Payer: Self-pay | Admitting: Physical Therapy

## 2017-12-21 ENCOUNTER — Ambulatory Visit: Payer: Medicare Other | Admitting: Physical Therapy

## 2017-12-21 DIAGNOSIS — R42 Dizziness and giddiness: Secondary | ICD-10-CM | POA: Diagnosis not present

## 2017-12-21 DIAGNOSIS — R262 Difficulty in walking, not elsewhere classified: Secondary | ICD-10-CM

## 2017-12-21 DIAGNOSIS — R2681 Unsteadiness on feet: Secondary | ICD-10-CM | POA: Diagnosis not present

## 2017-12-21 DIAGNOSIS — H8113 Benign paroxysmal vertigo, bilateral: Secondary | ICD-10-CM | POA: Diagnosis not present

## 2017-12-22 NOTE — Therapy (Signed)
[facility] [address] [place], [place], [address] Phone: [phone]   Fax:  [phone]  Physical Therapy Treatment  Patient Details  Name: Corey Oneill MRN: [id number] Date of Birth: 1936-10-08 Referring Provider: [name] MD   Encounter Date: 12/21/2017  PT End of Session - 12/22/17 0703    Visit Number  7    Number of Visits  13    Date for PT Re-Evaluation  02/05/18    Authorization Type  MCR; AARP    Authorization Time Period  11/28/17 to 01/27/2018; 12/07/17 to 02/05/18    PT Start Time  1315    PT Stop Time  1400    PT Time Calculation (min)  45 min    Activity Tolerance  Patient tolerated treatment well reported feeling better by end of session    Behavior During Therapy  [facility] for tasks assessed/performed       Past Medical History:  Diagnosis Date  . Anginal pain ([place])   . Aortic stenosis    a. peak to peak gradient by LHC 8/16:  37 mmHg (moderately severe)   . CAD (coronary artery disease)    a.  LHC 8/16: Mid to distal LAD 30%, OM1 40%, proximal mid RCA 40%, distal RCA 60% >> FFR 0.69  >> PCI: 3 x 15 mm Resolute DES  . Depression   . Esophageal reflux   . Family history of adverse reaction to anesthesia    "daughter has PONV"  . H/O blood clots    "get them in my stool and urine" (11/12/2015)  . Heart murmur   . Hx of radiation therapy 04/14/13-06/09/13   prostate 7800 cGy, 40 sessions, seminal vesicles 5600 cGy 40 sessions  . Hyperlipidemia   . 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 Prostate cancer ([place]) dx'd 2014  . Radiation proctitis     Past Surgical History:  Procedure Laterality Date  . CARDIAC CATHETERIZATION N/A 04/21/2015   Procedure: Right/Left Heart Cath and Coronary Angiography;  Surgeon: [name], MD;  Location: [facility];  Service: Cardiovascular;  Laterality: N/A;  . CARDIAC CATHETERIZATION N/A 06/18/2015   Procedure:  Intravascular Pressure Wire/FFR Study;  Surgeon: [name], MD;  Location: [facility];  Service: Cardiovascular;  Laterality: N/A;  . CARDIAC CATHETERIZATION N/A 06/18/2015   Procedure: Coronary Stent Intervention;  Surgeon: [name], MD;  Location: [facility];  Service: Cardiovascular;  Laterality: N/A;  . CARDIAC CATHETERIZATION N/A 06/18/2015   Procedure: Right/Left Heart Cath and Coronary Angiography;  Surgeon: [name], MD;  Location: [facility];  Service: Cardiovascular;  Laterality: N/A;  . CARDIAC CATHETERIZATION N/A 06/23/2015   Procedure: Left Heart Cath and Cors/Grafts Angiography;  Surgeon: [name], MD;  Location: [facility];  Service: Cardiovascular;  Laterality: N/A;  . CARDIAC CATHETERIZATION N/A 01/17/2016   Procedure: Right/Left Heart Cath and Coronary Angiography;  Surgeon: [name], MD;  Location: [facility];  Service: Cardiovascular;  Laterality: N/A;  . COLONOSCOPY    . FRACTURE SURGERY    . INGUINAL HERNIA REPAIR    . LAPAROSCOPIC APPENDECTOMY N/A 11/16/2015   Procedure: APPENDECTOMY LAPAROSCOPIC;  Surgeon: [name], MD;  Location: [facility];  Service: General;  Laterality: N/A;  . LEFT HEART CATH AND CORONARY ANGIOGRAPHY N/A 12/26/2016   Procedure: Left Heart Cath and Coronary Angiography;  Surgeon: Troy Sine,  MD;  Location: [facility];  Service: Cardiovascular;  Laterality: N/A;  . MELANOMA EXCISION Right 10/24/2016   Procedure: WIDE EXCISION MELANOMA RIGHT NECK AND RIGHT CHEST TIMES 2;  Surgeon: [name], MD;  Location: [facility];  Service: General;  Laterality: Right;  right medial and lateral lesion of chest and right neck  . NASAL FRACTURE SURGERY     "broken years ago; several ORs to correct it"  . NASAL SEPTUM SURGERY    . PROSTATE BIOPSY  2014   "needle biopsy"  . TEE WITHOUT CARDIOVERSION N/A 02/15/2016   Procedure: TRANSESOPHAGEAL ECHOCARDIOGRAM (TEE);  Surgeon: [name], MD;  Location: [facility];  Service: Open Heart Surgery;  Laterality: N/A;  . TONSILLECTOMY    . TRANSCATHETER AORTIC VALVE REPLACEMENT, TRANSFEMORAL  02/15/2016  . TRANSCATHETER AORTIC VALVE REPLACEMENT, TRANSFEMORAL N/A 02/15/2016   Procedure: TRANSCATHETER AORTIC VALVE REPLACEMENT, TRANSFEMORAL;  Surgeon: [name], MD;  Location: [facility];  Service: Open Heart Surgery;  Laterality: N/A;    There were no vitals filed for this visit.  Subjective Assessment - 12/21/17 1317    Subjective  I just feel really wobbly today. I felt funny even when driving. Did my exercises earlier today, but felt that way before I did the exercises and didn't get any worse.     Pertinent History  CAD, aortic stenosis, depression, prostate cancer, melanoma    Patient Stated Goals  to not fall; feel steady on his feet    Currently in Pain?  No/denies                      [facility] Adult PT Treatment/Exercise - 12/22/17 0001      Transfers   Transfers  Sit to Stand    Sit to Stand  6: Modified independent (Device/Increase time) cautious, incr time      Ambulation/Gait   Ambulation/Gait  Yes    Ambulation/Gait Assistance  4: Min guard;6: Modified independent (Device/Increase time)    Ambulation/Gait Assistance Details  minguard with head turns for safety, however did not drift >6 inches or stagger    Ambulation Distance (Feet)  120 Feet 200    Assistive device  None    Gait Pattern  Within Functional Limits    Ambulation Surface  Level;Indoor      Vestibular Treatment/Exercise - 12/22/17 0001      Vestibular Treatment/Exercise   Habituation Exercises  Comment seated forward bends x 5 no dizziness         Balance Exercises - 12/22/17 0656      Balance Exercises: Standing   Tandem Stance  Eyes open;Foam/compliant surface    Gait with Head Turns  Forward    Tandem Gait  Forward;Intermittent upper extremity support    Retro Gait  Foam/compliant surface vs solid surface; // bars     Sidestepping  Upper extremity support normal and up on toes    Step Over Hurdles / Cones  varying 8" and 6" on blue mat; consistent difficulty with 8" (misjudges height and catches foot)    Marching Limitations  attempting 2 second hold, able to hold x 1 sec most consistently without LOB (2 sec frequent LOB)    Other Standing Exercises  on blue airex, EO, EC, head turns; step off/on both anteriorly and posteriorly      OTAGO PROGRAM   Toe Walk  Support        PT Education - 12/22/17 0703    Education  provided  Yes    Education Details  reviewed HEP to clarify which exercises he should be doing and answered pt's questions    Person(s) Educated  Patient    Methods  Explanation;Handout    Comprehension  Verbalized understanding          PT Long Term Goals - 12/22/17 0258      PT LONG TERM GOAL #1   Title  Patient will be independent with HEP for vertigo and balance deficits. (Target for LTGs 12/13/17) (TARGET for unmet or new LTGs 01/06/18)    Status  On-going      PT LONG TERM GOAL #2   Title  Patient will have no vertigo with positional testing.     Status  Achieved      PT LONG TERM GOAL #3   Title  Patient will self-report at least 50% improvement in overall balance.     Status  On-going      PT LONG TERM GOAL #4   Title  Patient will complete DHI and set goal for LTG as appropriate.    Baseline  DHI given to pt on 12-17-17 to complete; 12/21/17 DHI 20% (mild impairment)    Status  Achieved      PT LONG TERM GOAL #5   Title  Patient's DHI score will decrease to <=16% (below lowest threshold for mild impairment). Target 01/06/18    Status  New            Plan - 12/22/17 0705    Clinical Impression Statement  At beginning of session reporting he felt more "wobbly" today, however ~10 minutes into session he reported he was feeling better. Session continued to focus on dynamic balance and gait challenges, expecially using compliant surfaces for increased vestibular  challenge. Patient can continue to benefit from PT as progressing towards LTGs.     Rehab Potential  Good    Clinical Impairments Affecting Rehab Potential  limited cervical ROM;     PT Frequency  2x / week    PT Duration  4 weeks    PT Treatment/Interventions  ADLs/Self Care Home Management;Canalith Repostioning;Gait training;Functional mobility training;Therapeutic activities;Therapeutic exercise;Balance training;Neuromuscular re-education;Manual techniques;Patient/family education;Passive range of motion;Visual/perceptual remediation/compensation;Vestibular;DME Instruction    PT Next Visit Plan  try VOR x 1 in standing; continue balance/vestibular exercises (especially on compliant surface and head turns)    PT Home Exercise Plan  instructed in ankle pumps and marching in place to assist in decreasing light-headedness with change in position; added standing on foam with EC on 12-04-17; gave sheet with balance on foam (4 positions) for HEP    Consulted and Agree with Plan of Care  Patient       Patient will benefit from skilled therapeutic intervention in order to improve the following deficits and impairments:  Abnormal gait, Decreased activity tolerance, Decreased balance, Decreased range of motion, Dizziness, Postural dysfunction, Decreased mobility  Visit Diagnosis: Unsteadiness on feet  Dizziness and giddiness  Difficulty in walking, not elsewhere classified     Problem List Patient Active Problem List   Diagnosis Date Noted  . Radiation proctitis   . Hx of radiation therapy   . Heart murmur   . H/O blood clots   . Family history of adverse reaction to anesthesia   . CAD (coronary artery disease)   . Aortic stenosis   . Anginal pain ([place])   . History of ITP 02/26/2017  . 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 Malignant melanoma  in situ ([place]) 09/04/2016  . 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 S/P appendectomy 11/12/2015  . Coronary  artery disease   . Esophageal reflux 03/22/2015  . Depression 11/20/2014  . Prostate cancer ([place]) 03/05/2013  . Erectile dysfunction   . Hyperlipidemia 06/07/2007    Rexanne Mano, PT 12/22/2017, 7:15 AM  [facility] [address] [place], [place], [address] Phone: [phone]   Fax:  [phone]  Name: TALLAN SANDOZ MRN: [id number] Date of Birth: 07-21-1936

## 2017-12-24 ENCOUNTER — Ambulatory Visit: Payer: Medicare Other | Admitting: Physical Therapy

## 2017-12-24 ENCOUNTER — Encounter: Payer: Self-pay | Admitting: Physical Therapy

## 2017-12-24 DIAGNOSIS — R42 Dizziness and giddiness: Secondary | ICD-10-CM

## 2017-12-24 DIAGNOSIS — R262 Difficulty in walking, not elsewhere classified: Secondary | ICD-10-CM | POA: Diagnosis not present

## 2017-12-24 DIAGNOSIS — H8113 Benign paroxysmal vertigo, bilateral: Secondary | ICD-10-CM | POA: Diagnosis not present

## 2017-12-24 DIAGNOSIS — R2681 Unsteadiness on feet: Secondary | ICD-10-CM | POA: Diagnosis not present

## 2017-12-24 NOTE — Therapy (Signed)
[facility] [address] [place], [place], [address] Phone: [phone]   Fax:  [phone]  Physical Therapy Treatment  Patient Details  Name: Corey Oneill MRN: [id number] Date of Birth: 08-19-1936 Referring Provider: [name] MD   Encounter Date: 12/24/2017  PT End of Session - 12/24/17 2110    Visit Number  8    Number of Visits  13    Date for PT Re-Evaluation  02/05/18    Authorization Type  MCR; AARP    Authorization Time Period  11/28/17 to 01/27/2018; 12/07/17 to 02/05/18    PT Start Time  1017    PT Stop Time  1101    PT Time Calculation (min)  44 min    Activity Tolerance  Patient tolerated treatment well    Behavior During Therapy  [facility] for tasks assessed/performed       Past Medical History:  Diagnosis Date  . Anginal pain ([place])   . Aortic stenosis    a. peak to peak gradient by LHC 8/16:  37 mmHg (moderately severe)   . CAD (coronary artery disease)    a.  LHC 8/16: Mid to distal LAD 30%, OM1 40%, proximal mid RCA 40%, distal RCA 60% >> FFR 0.69  >> PCI: 3 x 15 mm Resolute DES  . Depression   . Esophageal reflux   . Family history of adverse reaction to anesthesia    "daughter has PONV"  . H/O blood clots    "get them in my stool and urine" (11/12/2015)  . Heart murmur   . Hx of radiation therapy 04/14/13-06/09/13   prostate 7800 cGy, 40 sessions, seminal vesicles 5600 cGy 40 sessions  . Hyperlipidemia   . Malignant melanoma in situ ([place]) 09/04/2016   Right neck and chest  . Prostate cancer ([place]) dx'd 2014  . Radiation proctitis     Past Surgical History:  Procedure Laterality Date  . CARDIAC CATHETERIZATION N/A 04/21/2015   Procedure: Right/Left Heart Cath and Coronary Angiography;  Surgeon: [name], MD;  Location: [facility];  Service: Cardiovascular;  Laterality: N/A;  . CARDIAC CATHETERIZATION N/A 06/18/2015   Procedure: Intravascular Pressure Wire/FFR Study;  Surgeon:  [name], MD;  Location: [facility];  Service: Cardiovascular;  Laterality: N/A;  . CARDIAC CATHETERIZATION N/A 06/18/2015   Procedure: Coronary Stent Intervention;  Surgeon: [name], MD;  Location: [facility];  Service: Cardiovascular;  Laterality: N/A;  . CARDIAC CATHETERIZATION N/A 06/18/2015   Procedure: Right/Left Heart Cath and Coronary Angiography;  Surgeon: [name], MD;  Location: [facility];  Service: Cardiovascular;  Laterality: N/A;  . CARDIAC CATHETERIZATION N/A 06/23/2015   Procedure: Left Heart Cath and Cors/Grafts Angiography;  Surgeon: [name], MD;  Location: [facility];  Service: Cardiovascular;  Laterality: N/A;  . CARDIAC CATHETERIZATION N/A 01/17/2016   Procedure: Right/Left Heart Cath and Coronary Angiography;  Surgeon: [name], MD;  Location: [facility];  Service: Cardiovascular;  Laterality: N/A;  . COLONOSCOPY    . FRACTURE SURGERY    . INGUINAL HERNIA REPAIR    . LAPAROSCOPIC APPENDECTOMY N/A 11/16/2015   Procedure: APPENDECTOMY LAPAROSCOPIC;  Surgeon: [name], MD;  Location: [facility];  Service: General;  Laterality: N/A;  . LEFT HEART CATH AND CORONARY ANGIOGRAPHY N/A 12/26/2016   Procedure: Left Heart Cath and Coronary Angiography;  Surgeon: Troy Sine, MD;  Location: [facility];  Service: Cardiovascular;  Laterality: N/A;  . MELANOMA EXCISION Right 10/24/2016   Procedure: WIDE EXCISION MELANOMA RIGHT NECK AND RIGHT CHEST TIMES 2;  Surgeon: [name], MD;  Location: [facility];  Service: General;  Laterality: Right;  right medial and lateral lesion of chest and right neck  . NASAL FRACTURE SURGERY     "broken years ago; several ORs to correct it"  . NASAL SEPTUM SURGERY    . PROSTATE BIOPSY  2014   "needle biopsy"  . TEE WITHOUT CARDIOVERSION N/A 02/15/2016   Procedure: TRANSESOPHAGEAL ECHOCARDIOGRAM (TEE);  Surgeon: [name], MD;  Location: [facility];  Service: Open  Heart Surgery;  Laterality: N/A;  . TONSILLECTOMY    . TRANSCATHETER AORTIC VALVE REPLACEMENT, TRANSFEMORAL  02/15/2016  . TRANSCATHETER AORTIC VALVE REPLACEMENT, TRANSFEMORAL N/A 02/15/2016   Procedure: TRANSCATHETER AORTIC VALVE REPLACEMENT, TRANSFEMORAL;  Surgeon: [name], MD;  Location: [facility];  Service: Open Heart Surgery;  Laterality: N/A;    There were no vitals filed for this visit.  Subjective Assessment - 12/24/17 1022    Subjective  I did my exercises twice since last here.     Pertinent History  CAD, aortic stenosis, depression, prostate cancer, melanoma    Patient Stated Goals  to not fall; feel steady on his feet    Currently in Pain?  No/denies                           Balance Exercises - 12/24/17 1034      Balance Exercises: Standing   Stepping Strategy  Anterior;Posterior;Lateral;Foam/compliant surface red mat    Gait with Head Turns  Forward lt/rt; up/down; diagonals    Retro Gait  -- 40 ft    Turning  Both;5 reps 180 x 5; decr to 90 degrees    Other Standing Exercises  walking while tossing scarf alternatjng LUE and RUE             PT Long Term Goals - 12/22/17 0713      PT LONG TERM GOAL #1   Title  Patient will be independent with HEP for vertigo and balance deficits. (Target for LTGs 12/13/17) (TARGET for unmet or new LTGs 01/06/18)    Status  On-going      PT LONG TERM GOAL #2   Title  Patient will have no vertigo with positional testing.     Status  Achieved      PT LONG TERM GOAL #3   Title  Patient will self-report at least 50% improvement in overall balance.     Status  On-going      PT LONG TERM GOAL #4   Title  Patient will complete DHI and set goal for LTG as appropriate.    Baseline  DHI given to pt on 12-17-17 to complete; 12/21/17 DHI 20% (mild impairment)    Status  Achieved      PT LONG TERM GOAL #5   Title  Patient's DHI score will decrease to <=16% (below lowest threshold for mild impairment). Target 01/06/18     Status  New            Plan - 12/24/17 2101    Clinical Impression Statement  Pt reporting he continues to feel "fuzzy/foggy" in general. Session focused on balance training with particular focus on compliant surfaces, weight-shifting, and stepping strategies. Patient required assist with anterior stepping strategy activity to prevent fall. 180 degree turns increased his dizziness to  6/10 after 5 turns. Patient appreciative of PT's efforts and feels overall things are improving.     Rehab Potential  Good    Clinical Impairments Affecting Rehab Potential  limited cervical ROM;     PT Frequency  2x / week    PT Duration  4 weeks    PT Treatment/Interventions  ADLs/Self Care Home Management;Canalith Repostioning;Gait training;Functional mobility training;Therapeutic activities;Therapeutic exercise;Balance training;Neuromuscular re-education;Manual techniques;Patient/family education;Passive range of motion;Visual/perceptual remediation/compensation;Vestibular;DME Instruction    PT Next Visit Plan  continue balance/vestibular exercises (especially on compliant surface and head turns, eyes closed); practice 1/4 turns in corner and if symptoms <=5/10 add to HEP;     PT Home Exercise Plan  instructed in ankle pumps and marching in place to assist in decreasing light-headedness with change in position; added standing on foam with EC on 12-04-17; gave sheet with balance on foam (4 positions) for HEP    Consulted and Agree with Plan of Care  Patient       Patient will benefit from skilled therapeutic intervention in order to improve the following deficits and impairments:  Abnormal gait, Decreased activity tolerance, Decreased balance, Decreased range of motion, Dizziness, Postural dysfunction, Decreased mobility  Visit Diagnosis: Unsteadiness on feet  Dizziness and giddiness  Difficulty in walking, not elsewhere classified     Problem List Patient Active Problem List   Diagnosis Date  Noted  . Radiation proctitis   . Hx of radiation therapy   . Heart murmur   . H/O blood clots   . Family history of adverse reaction to anesthesia   . CAD (coronary artery disease)   . Aortic stenosis   . Anginal pain ([place])   . History of ITP 02/26/2017  . Malignant melanoma of neck ([place]) 10/02/2016  . 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 S/P appendectomy 11/12/2015  . Coronary artery disease   . Esophageal reflux 03/22/2015  . Depression 11/20/2014  . Prostate cancer ([place]) 03/05/2013  . Erectile dysfunction   . Hyperlipidemia 06/07/2007    Rexanne Mano, PT 12/24/2017, 9:12 PM  [facility] [address] [place], [place], [address] Phone: [phone]   Fax:  [phone]  Name: Corey Oneill MRN: [id number] Date of Birth: [birth year]

## 2017-12-25 ENCOUNTER — Other Ambulatory Visit: Payer: Self-pay | Admitting: Cardiovascular Disease

## 2017-12-25 ENCOUNTER — Ambulatory Visit: Payer: Medicare Other | Admitting: Physical Therapy

## 2017-12-25 DIAGNOSIS — R42 Dizziness and giddiness: Secondary | ICD-10-CM

## 2017-12-25 DIAGNOSIS — H8113 Benign paroxysmal vertigo, bilateral: Secondary | ICD-10-CM | POA: Diagnosis not present

## 2017-12-25 DIAGNOSIS — R262 Difficulty in walking, not elsewhere classified: Secondary | ICD-10-CM | POA: Diagnosis not present

## 2017-12-25 DIAGNOSIS — R2681 Unsteadiness on feet: Secondary | ICD-10-CM | POA: Diagnosis not present

## 2017-12-26 ENCOUNTER — Encounter: Payer: Self-pay | Admitting: Physical Therapy

## 2017-12-26 NOTE — Therapy (Signed)
[facility] [address] [place], [place], [address] Phone: [phone]   Fax:  [phone]  Physical Therapy Treatment  Patient Details  Name: Corey Oneill MRN: [id number] Date of Birth: 1936-02-12 Referring Provider: [name] MD   Encounter Date: 12/25/2017  PT End of Session - 12/26/17 1840    Visit Number  9    Number of Visits  13    Date for PT Re-Evaluation  02/05/18    Authorization Type  MCR; AARP    Authorization Time Period  11/28/17 to 01/27/2018; 12/07/17 to 02/05/18    PT Start Time  0933    PT Stop Time  1016    PT Time Calculation (min)  43 min    Equipment Utilized During Treatment  Gait belt       Past Medical History:  Diagnosis Date  . Anginal pain ([place])   . Aortic stenosis    a. peak to peak gradient by LHC 8/16:  37 mmHg (moderately severe)   . CAD (coronary artery disease)    a.  LHC 8/16: Mid to distal LAD 30%, OM1 40%, proximal mid RCA 40%, distal RCA 60% >> FFR 0.69  >> PCI: 3 x 15 mm Resolute DES  . Depression   . Esophageal reflux   . Family history of adverse reaction to anesthesia    "daughter has PONV"  . H/O blood clots    "get them in my stool and urine" (11/12/2015)  . Heart murmur   . Hx of radiation therapy 04/14/13-06/09/13   prostate 7800 cGy, 40 sessions, seminal vesicles 5600 cGy 40 sessions  . Hyperlipidemia   . 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 Prostate cancer ([place]) dx'd 2014  . Radiation proctitis     Past Surgical History:  Procedure Laterality Date  . CARDIAC CATHETERIZATION N/A 04/21/2015   Procedure: Right/Left Heart Cath and Coronary Angiography;  Surgeon: [name], MD;  Location: [facility];  Service: Cardiovascular;  Laterality: N/A;  . CARDIAC CATHETERIZATION N/A 06/18/2015   Procedure: Intravascular Pressure Wire/FFR Study;  Surgeon: [name], MD;  Location: [facility];  Service:  Cardiovascular;  Laterality: N/A;  . CARDIAC CATHETERIZATION N/A 06/18/2015   Procedure: Coronary Stent Intervention;  Surgeon: [name], MD;  Location: [facility];  Service: Cardiovascular;  Laterality: N/A;  . CARDIAC CATHETERIZATION N/A 06/18/2015   Procedure: Right/Left Heart Cath and Coronary Angiography;  Surgeon: [name], MD;  Location: [facility];  Service: Cardiovascular;  Laterality: N/A;  . CARDIAC CATHETERIZATION N/A 06/23/2015   Procedure: Left Heart Cath and Cors/Grafts Angiography;  Surgeon: [name], MD;  Location: [facility];  Service: Cardiovascular;  Laterality: N/A;  . CARDIAC CATHETERIZATION N/A 01/17/2016   Procedure: Right/Left Heart Cath and Coronary Angiography;  Surgeon: [name], MD;  Location: [facility];  Service: Cardiovascular;  Laterality: N/A;  . COLONOSCOPY    . FRACTURE SURGERY    . INGUINAL HERNIA REPAIR    . LAPAROSCOPIC APPENDECTOMY N/A 11/16/2015   Procedure: APPENDECTOMY LAPAROSCOPIC;  Surgeon: [name], MD;  Location: [facility];  Service: General;  Laterality: N/A;  . LEFT HEART CATH AND CORONARY ANGIOGRAPHY N/A 12/26/2016   Procedure: Left Heart Cath and Coronary Angiography;  Surgeon: Troy Sine, MD;  Location: [facility];  Service: Cardiovascular;  Laterality: N/A;  . MELANOMA EXCISION Right  10/24/2016   Procedure: WIDE EXCISION MELANOMA RIGHT NECK AND RIGHT CHEST TIMES 2;  Surgeon: [name], MD;  Location: [facility];  Service: General;  Laterality: Right;  right medial and lateral lesion of chest and right neck  . NASAL FRACTURE SURGERY     "broken years ago; several ORs to correct it"  . NASAL SEPTUM SURGERY    . PROSTATE BIOPSY  2014   "needle biopsy"  . TEE WITHOUT CARDIOVERSION N/A 02/15/2016   Procedure: TRANSESOPHAGEAL ECHOCARDIOGRAM (TEE);  Surgeon: [name], MD;  Location: [facility];  Service: Open Heart Surgery;  Laterality: N/A;  . TONSILLECTOMY    .  TRANSCATHETER AORTIC VALVE REPLACEMENT, TRANSFEMORAL  02/15/2016  . TRANSCATHETER AORTIC VALVE REPLACEMENT, TRANSFEMORAL N/A 02/15/2016   Procedure: TRANSCATHETER AORTIC VALVE REPLACEMENT, TRANSFEMORAL;  Surgeon: [name], MD;  Location: [facility];  Service: Open Heart Surgery;  Laterality: N/A;    There were no vitals filed for this visit.  Subjective Assessment - 12/26/17 1834    Subjective  Pt states he is going out of town (to [facility]) on Wed. - has a lot to do to get ready to go    Pertinent History  CAD, aortic stenosis, depression, prostate cancer, melanoma    Patient Stated Goals  to not fall; feel steady on his feet    Currently in Pain?  No/denies                      [facility] Adult PT Treatment/Exercise - 12/26/17 0001      High Level Balance   High Level Balance Activities  Side stepping;Braiding;Backward walking;Turns;Marching forwards;Marching backwards performed on red mat          Balance Exercises - 12/26/17 1836      Balance Exercises: Standing   Standing Eyes Opened  Narrow base of support (BOS);Wide (BOA);Head turns;Foam/compliant surface;5 reps    Standing Eyes Closed  Narrow base of support (BOS);Foam/compliant surface;1 rep;10 secs    Tandem Stance  Eyes open;Foam/compliant surface;5 reps with horizontal and vertical head turns      Gait with Head Turns  Forward 100'    Tandem Gait  Forward 20' on floor with CGA    Retro Gait  Foam/compliant surface;1 rep    Step Over Hurdles / Cones  tapping over and back of seam line on mat - staright over and crossovers  5 reps with CGA       Pt performed sit to stand (no UE support)- step and pivot turn to Rt and Lt sides, return to sitting for habituation of vertigo  with quick turns - 5 reps to each side  Amb. Forward/backward tossing ball - 14' with CGA - pt had no LOB with this activity  Standing on Bosu - minimal UE support - head turns side to side and vertical 5 reps each; moving each leg  up/back and laterally 5 reps each Leg with UE support prn on // bar  Cone taps on blue mat on floor with CGA;  Tipping cones over and standing upright with CGA for improved SLS      PT Long Term Goals - 12/22/17 0713      PT LONG TERM GOAL #1   Title  Patient will be independent with HEP for vertigo and balance deficits. (Target for LTGs 12/13/17) (TARGET for unmet or new LTGs 01/06/18)    Status  On-going      PT LONG TERM GOAL #2  Title  Patient will have no vertigo with positional testing.     Status  Achieved      PT LONG TERM GOAL #3   Title  Patient will self-report at least 50% improvement in overall balance.     Status  On-going      PT LONG TERM GOAL #4   Title  Patient will complete DHI and set goal for LTG as appropriate.    Baseline  DHI given to pt on 12-17-17 to complete; 12/21/17 DHI 20% (mild impairment)    Status  Achieved      PT LONG TERM GOAL #5   Title  Patient's DHI score will decrease to <=16% (below lowest threshold for mild impairment). Target 01/06/18    Status  New            Plan - 12/26/17 1842    Clinical Impression Statement  Pt reports feeling off balance/ "dysequilibrium" with balance activities on compliant surfaces, especially with activities with EC and with head turns- indicative of vestibular hypofunction.  Pt does not lose balance with activities and is avble to continue with short rest breaks.      Rehab Potential  Good    Clinical Impairments Affecting Rehab Potential  limited cervical ROM;     PT Frequency  2x / week    PT Duration  4 weeks    PT Treatment/Interventions  ADLs/Self Care Home Management;Canalith Repostioning;Gait training;Functional mobility training;Therapeutic activities;Therapeutic exercise;Balance training;Neuromuscular re-education;Manual techniques;Patient/family education;Passive range of motion;Visual/perceptual remediation/compensation;Vestibular;DME Instruction    PT Next Visit Plan  begin checking LTG's - plan  D/C next week    PT Home Exercise Plan  instructed in ankle pumps and marching in place to assist in decreasing light-headedness with change in position; added standing on foam with EC on 12-04-17; gave sheet with balance on foam (4 positions) for HEP    Consulted and Agree with Plan of Care  Patient       Patient will benefit from skilled therapeutic intervention in order to improve the following deficits and impairments:  Abnormal gait, Decreased activity tolerance, Decreased balance, Decreased range of motion, Dizziness, Postural dysfunction, Decreased mobility  Visit Diagnosis: Unsteadiness on feet  Dizziness and giddiness     Problem List Patient Active Problem List   Diagnosis Date Noted  . Radiation proctitis   . Hx of radiation therapy   . Heart murmur   . H/O blood clots   . Family history of adverse reaction to anesthesia   . CAD (coronary artery disease)   . Aortic stenosis   . Anginal pain ([place])   . History of ITP 02/26/2017  . 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 Malignant melanoma in situ ([place]) 09/04/2016  . 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 S/P appendectomy 11/12/2015  . Coronary artery disease   . Esophageal reflux 03/22/2015  . Depression 11/20/2014  . Prostate cancer ([place]) 03/05/2013  . Erectile dysfunction   . Hyperlipidemia 06/07/2007    Alda Lea, PT 12/26/2017, 6:46 PM  [facility] [address] [place], [place], [address] Phone: [phone]   Fax:  [phone]  Name: Corey Oneill MRN: [id number] Date of Birth: September 28, 1936

## 2017-12-30 DIAGNOSIS — R102 Pelvic and perineal pain: Secondary | ICD-10-CM | POA: Diagnosis not present

## 2017-12-30 DIAGNOSIS — S40211A Abrasion of right shoulder, initial encounter: Secondary | ICD-10-CM | POA: Diagnosis not present

## 2017-12-30 DIAGNOSIS — S0181XA Laceration without foreign body of other part of head, initial encounter: Secondary | ICD-10-CM | POA: Diagnosis not present

## 2017-12-30 DIAGNOSIS — M25561 Pain in right knee: Secondary | ICD-10-CM | POA: Diagnosis not present

## 2017-12-30 DIAGNOSIS — S0033XA Contusion of nose, initial encounter: Secondary | ICD-10-CM | POA: Diagnosis not present

## 2017-12-30 DIAGNOSIS — S81011A Laceration without foreign body, right knee, initial encounter: Secondary | ICD-10-CM | POA: Diagnosis not present

## 2017-12-30 DIAGNOSIS — S0031XA Abrasion of nose, initial encounter: Secondary | ICD-10-CM | POA: Diagnosis not present

## 2017-12-30 DIAGNOSIS — S0990XA Unspecified injury of head, initial encounter: Secondary | ICD-10-CM | POA: Diagnosis not present

## 2017-12-30 DIAGNOSIS — S199XXA Unspecified injury of neck, initial encounter: Secondary | ICD-10-CM | POA: Diagnosis not present

## 2017-12-30 DIAGNOSIS — S8001XA Contusion of right knee, initial encounter: Secondary | ICD-10-CM | POA: Diagnosis not present

## 2017-12-30 DIAGNOSIS — M542 Cervicalgia: Secondary | ICD-10-CM | POA: Diagnosis not present

## 2017-12-30 DIAGNOSIS — S0083XA Contusion of other part of head, initial encounter: Secondary | ICD-10-CM | POA: Diagnosis not present

## 2017-12-30 DIAGNOSIS — S098XXA Other specified injuries of head, initial encounter: Secondary | ICD-10-CM | POA: Diagnosis not present

## 2017-12-30 DIAGNOSIS — S0001XA Abrasion of scalp, initial encounter: Secondary | ICD-10-CM | POA: Diagnosis not present

## 2017-12-30 DIAGNOSIS — S3993XA Unspecified injury of pelvis, initial encounter: Secondary | ICD-10-CM | POA: Diagnosis not present

## 2017-12-30 DIAGNOSIS — Z043 Encounter for examination and observation following other accident: Secondary | ICD-10-CM | POA: Diagnosis not present

## 2017-12-30 DIAGNOSIS — S0081XA Abrasion of other part of head, initial encounter: Secondary | ICD-10-CM | POA: Diagnosis not present

## 2017-12-31 ENCOUNTER — Ambulatory Visit: Payer: Medicare Other | Admitting: Physical Therapy

## 2018-01-01 ENCOUNTER — Ambulatory Visit (INDEPENDENT_AMBULATORY_CARE_PROVIDER_SITE_OTHER): Payer: Medicare Other | Admitting: Family Medicine

## 2018-01-01 ENCOUNTER — Encounter: Payer: Self-pay | Admitting: Family Medicine

## 2018-01-01 VITALS — BP 136/84 | HR 62 | Wt 157.2 lb

## 2018-01-01 DIAGNOSIS — H52203 Unspecified astigmatism, bilateral: Secondary | ICD-10-CM | POA: Diagnosis not present

## 2018-01-01 DIAGNOSIS — H0100A Unspecified blepharitis right eye, upper and lower eyelids: Secondary | ICD-10-CM | POA: Diagnosis not present

## 2018-01-01 DIAGNOSIS — T07XXXA Unspecified multiple injuries, initial encounter: Secondary | ICD-10-CM | POA: Diagnosis not present

## 2018-01-01 DIAGNOSIS — H02052 Trichiasis without entropian right lower eyelid: Secondary | ICD-10-CM | POA: Diagnosis not present

## 2018-01-01 DIAGNOSIS — H2513 Age-related nuclear cataract, bilateral: Secondary | ICD-10-CM | POA: Diagnosis not present

## 2018-01-01 NOTE — Progress Notes (Signed)
   Subjective:    Patient ID: Corey Oneill, male    DOB: 1936-05-28, 82 y.o.   MRN: [id number]  HPI He was in [facility] and apparently tripped and fell injuring his forehead and nose as well as both hands.  He was seen in the emergency room.  Apparently CT scans were done on 2 occasions both of which were negative.  He is here for a follow-up visit.  He has no headache, blurred vision, double vision, weakness, numbness or tingling.  He does have some dizziness but notes that this is been going on for quite some time.   Review of Systems     Objective:   Physical Exam Alert and in no distress.  Abrasions noted to the mid forehead area with Steri-Strips in place.  Also abrasion present on the nose.  Contusions noted on the dorsal aspect of both wrists.  Normal motion of the wrist and fingers.  No palpable tenderness noted.       Assessment & Plan:  Multiple abrasions  Multiple contusions At this point he does seem to be healing quite well and I recommended conservative care for this.  He will call if he has further trouble.

## 2018-01-04 ENCOUNTER — Ambulatory Visit: Payer: Medicare Other | Admitting: Physical Therapy

## 2018-01-04 ENCOUNTER — Encounter: Payer: Self-pay | Admitting: Physical Therapy

## 2018-01-04 DIAGNOSIS — R262 Difficulty in walking, not elsewhere classified: Secondary | ICD-10-CM | POA: Diagnosis not present

## 2018-01-04 DIAGNOSIS — H8113 Benign paroxysmal vertigo, bilateral: Secondary | ICD-10-CM

## 2018-01-04 DIAGNOSIS — R42 Dizziness and giddiness: Secondary | ICD-10-CM

## 2018-01-04 DIAGNOSIS — R2681 Unsteadiness on feet: Secondary | ICD-10-CM | POA: Diagnosis not present

## 2018-01-04 NOTE — Therapy (Signed)
[facility] [address] [place], [place], [address] Phone: [phone]   Fax:  [phone]  Physical Therapy Treatment  Patient Details  Name: Corey Oneill MRN: [id number] Date of Birth: 12-23-1935 Referring Provider: [name] MD   Encounter Date: 01/04/2018  PT End of Session - 01/04/18 1928    Visit Number  10    Number of Visits  21 per re-cert 2/99    Date for PT Re-Evaluation  03/05/18    Authorization Type  MCR; AARP    Authorization Time Period  11/28/17 to 01/27/2018; 12/07/17 to 02/05/18; 01/04/18 to 03/05/18    PT Start Time  0933    PT Stop Time  1012    PT Time Calculation (min)  39 min    Equipment Utilized During Treatment  --    Activity Tolerance  Patient tolerated treatment well    Behavior During Therapy  [facility] for tasks assessed/performed       Past Medical History:  Diagnosis Date  . Anginal pain ([place])   . Aortic stenosis    a. peak to peak gradient by LHC 8/16:  37 mmHg (moderately severe)   . CAD (coronary artery disease)    a.  LHC 8/16: Mid to distal LAD 30%, OM1 40%, proximal mid RCA 40%, distal RCA 60% >> FFR 0.69  >> PCI: 3 x 15 mm Resolute DES  . Depression   . Esophageal reflux   . Family history of adverse reaction to anesthesia    "daughter has PONV"  . H/O blood clots    "get them in my stool and urine" (11/12/2015)  . Heart murmur   . Hx of radiation therapy 04/14/13-06/09/13   prostate 7800 cGy, 40 sessions, seminal vesicles 5600 cGy 40 sessions  . Hyperlipidemia   . Malignant melanoma in situ ([place]) 09/04/2016   Right neck and chest  . Prostate cancer ([place]) dx'd 2014  . Radiation proctitis     Past Surgical History:  Procedure Laterality Date  . CARDIAC CATHETERIZATION N/A 04/21/2015   Procedure: Right/Left Heart Cath and Coronary Angiography;  Surgeon: [name], MD;  Location: [facility];  Service: Cardiovascular;  Laterality: N/A;  . CARDIAC  CATHETERIZATION N/A 06/18/2015   Procedure: Intravascular Pressure Wire/FFR Study;  Surgeon: [name], MD;  Location: [facility];  Service: Cardiovascular;  Laterality: N/A;  . CARDIAC CATHETERIZATION N/A 06/18/2015   Procedure: Coronary Stent Intervention;  Surgeon: [name], MD;  Location: [facility];  Service: Cardiovascular;  Laterality: N/A;  . CARDIAC CATHETERIZATION N/A 06/18/2015   Procedure: Right/Left Heart Cath and Coronary Angiography;  Surgeon: [name], MD;  Location: [facility];  Service: Cardiovascular;  Laterality: N/A;  . CARDIAC CATHETERIZATION N/A 06/23/2015   Procedure: Left Heart Cath and Cors/Grafts Angiography;  Surgeon: [name], MD;  Location: [facility];  Service: Cardiovascular;  Laterality: N/A;  . CARDIAC CATHETERIZATION N/A 01/17/2016   Procedure: Right/Left Heart Cath and Coronary Angiography;  Surgeon: [name], MD;  Location: [facility];  Service: Cardiovascular;  Laterality: N/A;  . COLONOSCOPY    . FRACTURE SURGERY    . INGUINAL HERNIA REPAIR    . LAPAROSCOPIC APPENDECTOMY N/A 11/16/2015   Procedure: APPENDECTOMY LAPAROSCOPIC;  Surgeon: [name], MD;  Location: [facility];  Service: General;  Laterality: N/A;  . LEFT HEART CATH AND CORONARY ANGIOGRAPHY N/A 12/26/2016   Procedure: Left Heart Cath  and Coronary Angiography;  Surgeon: Troy Sine, MD;  Location: [facility];  Service: Cardiovascular;  Laterality: N/A;  . MELANOMA EXCISION Right 10/24/2016   Procedure: WIDE EXCISION MELANOMA RIGHT NECK AND RIGHT CHEST TIMES 2;  Surgeon: [name], MD;  Location: [facility];  Service: General;  Laterality: Right;  right medial and lateral lesion of chest and right neck  . NASAL FRACTURE SURGERY     "broken years ago; several ORs to correct it"  . NASAL SEPTUM SURGERY    . PROSTATE BIOPSY  2014   "needle biopsy"  . TEE WITHOUT CARDIOVERSION N/A 02/15/2016   Procedure:  TRANSESOPHAGEAL ECHOCARDIOGRAM (TEE);  Surgeon: [name], MD;  Location: [facility];  Service: Open Heart Surgery;  Laterality: N/A;  . TONSILLECTOMY    . TRANSCATHETER AORTIC VALVE REPLACEMENT, TRANSFEMORAL  02/15/2016  . TRANSCATHETER AORTIC VALVE REPLACEMENT, TRANSFEMORAL N/A 02/15/2016   Procedure: TRANSCATHETER AORTIC VALVE REPLACEMENT, TRANSFEMORAL;  Surgeon: [name], MD;  Location: [facility];  Service: Open Heart Surgery;  Laterality: N/A;    There were no vitals filed for this visit.  Subjective Assessment - 01/04/18 0933    Subjective  Tripped stepping up a curb (misjudged height and caught his foot). Went to ER due to multiple facial abrasions. CT head negative x 2.  Since then has been prescribed eye ointment to use twice a day. This ointment obscures his vision for some time.     Pertinent History  CAD, aortic stenosis, depression, prostate cancer, melanoma    Patient Stated Goals  to not fall; feel steady on his feet    Currently in Pain?  No/denies         [facility] PT Assessment - 01/04/18 0958      Functional Gait  Assessment   Gait Level Surface  Walks 20 ft in less than 7 sec but greater than 5.5 sec, uses assistive device, slower speed, mild gait deviations, or deviates 6-10 in outside of the 12 in walkway width. 5.97 sec    Change in Gait Speed  Able to smoothly change walking speed without loss of balance or gait deviation. Deviate no more than 6 in outside of the 12 in walkway width.    Gait with Horizontal Head Turns  Performs head turns smoothly with slight change in gait velocity (eg, minor disruption to smooth gait path), deviates 6-10 in outside 12 in walkway width, or uses an assistive device.    Gait with Vertical Head Turns  Performs head turns with no change in gait. Deviates no more than 6 in outside 12 in walkway width.    Gait and Pivot Turn  Pivot turns safely within 3 sec and stops quickly with no loss of balance.    Step Over Obstacle  Is able to step over  one shoe box (4.5 in total height) but must slow down and adjust steps to clear box safely. May require verbal cueing.    Gait with Narrow Base of Support  Ambulates less than 4 steps heel to toe or cannot perform without assistance.    Gait with Eyes Closed  Walks 20 ft, no assistive devices, good speed, no evidence of imbalance, normal gait pattern, deviates no more than 6 in outside 12 in walkway width. Ambulates 20 ft in less than 7 sec.    Ambulating Backwards  Walks 20 ft, uses assistive device, slower speed, mild gait deviations, deviates 6-10 in outside 12 in walkway width.    Steps  Alternating feet, must use rail.    Total Score  21    FGA comment:  19-24 = medium risk fall; score declined since 11/29/17                  [facility] Adult PT Treatment/Exercise - 01/04/18 0001      Transfers   Transfers  Sit to Stand    Sit to Stand  6: Modified independent (Device/Increase time)      Ambulation/Gait   Ambulation/Gait  Yes    Ambulation/Gait Assistance  7: Independent    Ambulation/Gait Assistance Details  during gait velocity; enter/exit clinic    Ambulation Distance (Feet)  60 Feet 70, 60    Assistive device  None    Gait Pattern  Step-through pattern;Wide base of support slightly wider than usual; ?cautious due to recent fall    Ambulation Surface  Indoor    Gait velocity  32.8/9.53=3.44 norm for 9-79 yr old 3.08 ft/sec    Stairs  Yes    Stairs Assistance  6: Modified independent (Device/Increase time)    Stair Management Technique  Two rails;Alternating pattern;Forwards ascend no rails; descend lite use bil rails    Number of Stairs  4    Height of Stairs  6      Self-Care   Self-Care  Other Self-Care Comments    Other Self-Care Comments   DHI score 16/100             PT Education - 01/04/18 1927    Education provided  Yes    Education Details  results of assessment of LTGs and moving forward with PT POC    Person(s) Educated  Patient    Methods   Explanation    Comprehension  Verbalized understanding          PT Long Term Goals - 01/04/18 1920      PT LONG TERM GOAL #1   Title  Patient will be independent with HEP for vertigo and balance deficits. (Target for LTGs 12/13/17) (TARGET for unmet or new LTGs 01/06/18)    Status  Achieved      PT LONG TERM GOAL #2   Title  Patient will have no vertigo with positional testing.     Status  Achieved      PT LONG TERM GOAL #3   Title  Patient will self-report at least 50% improvement in overall balance.     Baseline  12/07/17 pt reports 25% improvement; 01/04/18 pt continues to feel balance 25% improved    Status  Not Met      PT LONG TERM GOAL #4   Title  Patient will complete DHI and set goal for LTG as appropriate.    Baseline  DHI given to pt on 12-17-17 to complete; 12/21/17 DHI 20% (mild impairment)    Status  Achieved      PT LONG TERM GOAL #5   Title  Patient's DHI score will decrease to <=16% (below lowest threshold for mild impairment). Target 01/06/18    Baseline  01/04/18  16%    Status  Achieved      Additional Long Term Goals   Additional Long Term Goals  Yes      PT LONG TERM GOAL #6   Title  Patient will be independent with updated HEP to address balance deficits and potential vestibular hypofunction. (Target 02/03/18)    Time  4    Period  Weeks    Status  New  Target Date  02/03/18      PT LONG TERM GOAL #7   Title  Patient will improve FGA score to >=26/30 to indicate lesser fall risk and improved balance/gait.     Baseline  01/04/18  21/30    Time  4    Period  Weeks    Status  New      PT LONG TERM GOAL #8   Title  Patient will be able to verbalize a plan for community-based exercise upon discharge from PT.     Time  4    Period  Weeks    Status  New            Plan - 01/04/18 1933    Clinical Impression Statement  Patient returned after missing last appointment due to trip/fall onto sidewalk while out-of-town and could not get back to town for  appointment in time. Assessed progress towards LTGs with patient demonstrating more cautious mobility and gait with decline in FGA. Patient met 4 of 5 LTGs (some were met related to resolving his BPPV prior to this recent fall). He has shown signs indicative of a vestibular hypofunction (which can occur with BPPV). Overall, patient feels discouraged re: his safety and independence and can benefit from continued short course of PT to update his HEP (which he now has a renewed interest in) and improve his balance and confidence.     Rehab Potential  Good    Clinical Impairments Affecting Rehab Potential  limited cervical ROM;     PT Frequency  2x / week    PT Duration  4 weeks    PT Treatment/Interventions  ADLs/Self Care Home Management;Canalith Repostioning;Gait training;Functional mobility training;Therapeutic activities;Therapeutic exercise;Balance training;Neuromuscular re-education;Manual techniques;Patient/family education;Passive range of motion;Visual/perceptual remediation/compensation;Vestibular;DME Instruction    PT Next Visit Plan  re-vamp HEP (pt reports he is only doing tandem walking and corner balance exercises on a pillow)--add backwards walking, ?step ups with rail, SLS; gait with dual-tasks    PT Home Exercise Plan  instructed in ankle pumps and marching in place to assist in decreasing light-headedness with change in position; added standing on foam with EC on 12-04-17; gave sheet with balance on foam (4 positions) for HEP    Consulted and Agree with Plan of Care  Patient       Patient will benefit from skilled therapeutic intervention in order to improve the following deficits and impairments:  Abnormal gait, Decreased activity tolerance, Decreased balance, Decreased range of motion, Dizziness, Postural dysfunction, Decreased mobility, Impaired perceived functional ability  Visit Diagnosis: Unsteadiness on feet - Plan: PT plan of care cert/re-cert  Dizziness and giddiness - Plan:  PT plan of care cert/re-cert  Difficulty in walking, not elsewhere classified - Plan: PT plan of care cert/re-cert  BPPV (benign paroxysmal positional vertigo), bilateral - Plan: PT plan of care cert/re-cert     Problem List Patient Active Problem List   Diagnosis Date Noted  . Radiation proctitis   . Hx of radiation therapy   . Heart murmur   . H/O blood clots   . Family history of adverse reaction to anesthesia   . CAD (coronary artery disease)   . Aortic stenosis   . Anginal pain ([place])   . History of ITP 02/26/2017  . 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 S/P appendectomy 11/12/2015  . Coronary artery disease   . Esophageal  reflux 03/22/2015  . Depression 11/20/2014  . Prostate cancer ([place]) 03/05/2013  . Erectile dysfunction   . Hyperlipidemia 06/07/2007    Rexanne Mano, PT 01/04/2018, 7:50 PM  [facility] [address] [place], [place], [address] Phone: [phone]   Fax:  [phone]  Name: JALONI DAVOLI MRN: [id number] Date of Birth: June 25, 1936

## 2018-01-07 ENCOUNTER — Ambulatory Visit: Payer: Medicare Other | Admitting: Physical Therapy

## 2018-01-07 ENCOUNTER — Encounter: Payer: Self-pay | Admitting: Physical Therapy

## 2018-01-07 DIAGNOSIS — R2681 Unsteadiness on feet: Secondary | ICD-10-CM | POA: Diagnosis not present

## 2018-01-07 DIAGNOSIS — R262 Difficulty in walking, not elsewhere classified: Secondary | ICD-10-CM | POA: Diagnosis not present

## 2018-01-07 DIAGNOSIS — R42 Dizziness and giddiness: Secondary | ICD-10-CM | POA: Diagnosis not present

## 2018-01-07 DIAGNOSIS — H8113 Benign paroxysmal vertigo, bilateral: Secondary | ICD-10-CM | POA: Diagnosis not present

## 2018-01-07 NOTE — Patient Instructions (Signed)
Single Leg - Eyes Open    Holding support with only two fingers, lift right leg while maintaining balance over other leg. Progress to removing hands from support surface for longer periods of time. Hold__10__ seconds. Alternate and do on the other leg.  Repeat __3__ times per session. Do __1-2__ sessions per day.  Copyright  VHI. All rights reserved.     Forward Step Up    Stand facing step. Step up leading with left leg. Slowly step down leading with left leg. Perform _5__ reps. Switch and complete 5 reps with right leg leading. Immediately repeat with left leg and again with right leg.   Rest and repeat once again.   Copyright  VHI. All rights reserved.

## 2018-01-07 NOTE — Therapy (Signed)
[facility] [address] [place], [place], [address] Phone: [phone]   Fax:  [phone]  Physical Therapy Treatment  Patient Details  Name: Corey Oneill MRN: [id number] Date of Birth: 1936-03-31 Referring Provider: [name] MD   Encounter Date: 01/07/2018  PT End of Session - 01/07/18 1224    Visit Number  11    Number of Visits  21 per re-cert 5/28    Date for PT Re-Evaluation  03/05/18    Authorization Type  MCR; AARP    Authorization Time Period  11/28/17 to 01/27/2018; 12/07/17 to 02/05/18; 01/04/18 to 03/05/18    PT Start Time  0818 pt arrived late    PT Stop Time  0845    PT Time Calculation (min)  27 min    Activity Tolerance  Patient tolerated treatment well    Behavior During Therapy  [facility] for tasks assessed/performed       Past Medical History:  Diagnosis Date  . Anginal pain ([place])   . Aortic stenosis    a. peak to peak gradient by LHC 8/16:  37 mmHg (moderately severe)   . CAD (coronary artery disease)    a.  LHC 8/16: Mid to distal LAD 30%, OM1 40%, proximal mid RCA 40%, distal RCA 60% >> FFR 0.69  >> PCI: 3 x 15 mm Resolute DES  . Depression   . Esophageal reflux   . Family history of adverse reaction to anesthesia    "daughter has PONV"  . H/O blood clots    "get them in my stool and urine" (11/12/2015)  . Heart murmur   . Hx of radiation therapy 04/14/13-06/09/13   prostate 7800 cGy, 40 sessions, seminal vesicles 5600 cGy 40 sessions  . Hyperlipidemia   . 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 Prostate cancer ([place]) dx'd 2014  . Radiation proctitis     Past Surgical History:  Procedure Laterality Date  . CARDIAC CATHETERIZATION N/A 04/21/2015   Procedure: Right/Left Heart Cath and Coronary Angiography;  Surgeon: [name], MD;  Location: [facility];  Service: Cardiovascular;  Laterality: N/A;  . CARDIAC CATHETERIZATION N/A 06/18/2015   Procedure:  Intravascular Pressure Wire/FFR Study;  Surgeon: [name], MD;  Location: [facility];  Service: Cardiovascular;  Laterality: N/A;  . CARDIAC CATHETERIZATION N/A 06/18/2015   Procedure: Coronary Stent Intervention;  Surgeon: [name], MD;  Location: [facility];  Service: Cardiovascular;  Laterality: N/A;  . CARDIAC CATHETERIZATION N/A 06/18/2015   Procedure: Right/Left Heart Cath and Coronary Angiography;  Surgeon: [name], MD;  Location: [place] [facility];  Service: Cardiovascular;  Laterality: N/A;  . CARDIAC CATHETERIZATION N/A 06/23/2015   Procedure: Left Heart Cath and Cors/Grafts Angiography;  Surgeon: [name], MD;  Location: [facility];  Service: Cardiovascular;  Laterality: N/A;  . CARDIAC CATHETERIZATION N/A 01/17/2016   Procedure: Right/Left Heart Cath and Coronary Angiography;  Surgeon: [name], MD;  Location: [facility];  Service: Cardiovascular;  Laterality: N/A;  . COLONOSCOPY    . FRACTURE SURGERY    . INGUINAL HERNIA REPAIR    . LAPAROSCOPIC APPENDECTOMY N/A 11/16/2015   Procedure: APPENDECTOMY LAPAROSCOPIC;  Surgeon: [name], MD;  Location: [facility];  Service: General;  Laterality: N/A;  . LEFT HEART CATH AND CORONARY ANGIOGRAPHY N/A 12/26/2016   Procedure: Left Heart Cath and Coronary Angiography;  Surgeon: [name]  Floyce Stakes, MD;  Location: [facility];  Service: Cardiovascular;  Laterality: N/A;  . MELANOMA EXCISION Right 10/24/2016   Procedure: WIDE EXCISION MELANOMA RIGHT NECK AND RIGHT CHEST TIMES 2;  Surgeon: [name], MD;  Location: [facility];  Service: General;  Laterality: Right;  right medial and lateral lesion of chest and right neck  . NASAL FRACTURE SURGERY     "broken years ago; several ORs to correct it"  . NASAL SEPTUM SURGERY    . PROSTATE BIOPSY  2014   "needle biopsy"  . TEE WITHOUT CARDIOVERSION N/A 02/15/2016   Procedure: TRANSESOPHAGEAL ECHOCARDIOGRAM (TEE);  Surgeon: [name], MD;  Location: [facility];  Service: Open Heart Surgery;  Laterality: N/A;  . TONSILLECTOMY    . TRANSCATHETER AORTIC VALVE REPLACEMENT, TRANSFEMORAL  02/15/2016  . TRANSCATHETER AORTIC VALVE REPLACEMENT, TRANSFEMORAL N/A 02/15/2016   Procedure: TRANSCATHETER AORTIC VALVE REPLACEMENT, TRANSFEMORAL;  Surgeon: [name], MD;  Location: [facility];  Service: Open Heart Surgery;  Laterality: N/A;    There were no vitals filed for this visit.  Subjective Assessment - 01/07/18 0819    Subjective  Feels he is psychologically hving a hard time since his fall and turning 82 yo. "Makes you begin to think about end of life issues"    Pertinent History  CAD, aortic stenosis, depression, prostate cancer, melanoma    Patient Stated Goals  to not fall; feel steady on his feet    Currently in Pain?  No/denies                      [facility] Adult PT Treatment/Exercise - 01/07/18 1219      Ambulation/Gait   Ambulation/Gait  Yes    Ambulation/Gait Assistance  7: Independent    Ambulation/Gait Assistance Details  not achieving full hip or knee extension of either LE; no foot drag noted as he fatigues    Ambulation Distance (Feet)  230 Feet    Assistive device  None    Gait Pattern  Step-through pattern;Wide base of support;Right flexed knee in stance;Left flexed knee in stance    Ambulation Surface  Indoor          Balance Exercises - 01/07/18 1221      Balance Exercises: Standing   SLS  Eyes open;Upper extremity support 2;10 secs single finger each hand; 6 reps    Step Ups  Forward;6 inch;UE support 1;Intermittent UE support alternating lt/rt x 20 reps        PT Education - 01/07/18 0825    Education Details  educated on value of counseling; he mentioned his faith is important to him and recommended [facility] counseling; see additions to HEP    Person(s) Educated  Patient    Methods  Explanation;Demonstration;Handout    Comprehension  Verbalized understanding;Returned  demonstration          PT Long Term Goals - 01/04/18 1920      PT LONG TERM GOAL #1   Title  Patient will be independent with HEP for vertigo and balance deficits. (Target for LTGs 12/13/17) (TARGET for unmet or new LTGs 01/06/18)    Status  Achieved      PT LONG TERM GOAL #2   Title  Patient will have no vertigo with positional testing.     Status  Achieved      PT LONG TERM GOAL #3   Title  Patient will self-report at least 50% improvement in overall balance.  Baseline  12/07/17 pt reports 25% improvement; 01/04/18 pt continues to feel balance 25% improved    Status  Not Met      PT LONG TERM GOAL #4   Title  Patient will complete DHI and set goal for LTG as appropriate.    Baseline  DHI given to pt on 12-17-17 to complete; 12/21/17 DHI 20% (mild impairment)    Status  Achieved      PT LONG TERM GOAL #5   Title  Patient's DHI score will decrease to <=16% (below lowest threshold for mild impairment). Target 01/06/18    Baseline  01/04/18  16%    Status  Achieved      Additional Long Term Goals   Additional Long Term Goals  Yes      PT LONG TERM GOAL #6   Title  Patient will be independent with updated HEP to address balance deficits and potential vestibular hypofunction. (Target 02/03/18)    Time  4    Period  Weeks    Status  New    Target Date  02/03/18      PT LONG TERM GOAL #7   Title  Patient will improve FGA score to >=26/30 to indicate lesser fall risk and improved balance/gait.     Baseline  01/04/18  21/30    Time  4    Period  Weeks    Status  New      PT LONG TERM GOAL #8   Title  Patient will be able to verbalize a plan for community-based exercise upon discharge from PT.     Time  4    Period  Weeks    Status  New            Plan - 01/07/18 1225    Clinical Impression Statement  Patient reporting he feels he has become too cautious since his fall. He's thinking about it alot. Encouraged patient to continue to be cautious when stepping up or over (as  he caught his foot stepping over obstacle last visit), however want him to maintain his velocity with walking as this improves his balance. Added step ups to his HEP (he has one outside step from patio up into house). Focus on balance traiing and continuing to update his HEP.     Rehab Potential  Good    Clinical Impairments Affecting Rehab Potential  limited cervical ROM;     PT Frequency  2x / week    PT Duration  4 weeks    PT Treatment/Interventions  ADLs/Self Care Home Management;Canalith Repostioning;Gait training;Functional mobility training;Therapeutic activities;Therapeutic exercise;Balance training;Neuromuscular re-education;Manual techniques;Patient/family education;Passive range of motion;Visual/perceptual remediation/compensation;Vestibular;DME Instruction    PT Next Visit Plan  re-vamp HEP (check his corner balance ex's on foam, he says they are getting easy); ?add backwards walking, gait with dual-tasks    PT Home Exercise Plan  instructed in ankle pumps and marching in place to assist in decreasing light-headedness with change in position; added standing on foam with EC on 12-04-17; gave sheet with balance on foam (4 positions) for HEP    Consulted and Agree with Plan of Care  Patient       Patient will benefit from skilled therapeutic intervention in order to improve the following deficits and impairments:  Abnormal gait, Decreased activity tolerance, Decreased balance, Decreased range of motion, Dizziness, Postural dysfunction, Decreased mobility, Impaired perceived functional ability  Visit Diagnosis: Unsteadiness on feet     Problem List Patient Active Problem List  Diagnosis Date Noted  . Radiation proctitis   . Hx of radiation therapy   . Heart murmur   . H/O blood clots   . Family history of adverse reaction to anesthesia   . CAD (coronary artery disease)   . Aortic stenosis   . Anginal pain ([place])   . History of ITP 02/26/2017  . 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 S/P appendectomy 11/12/2015  . Coronary artery disease   . Esophageal reflux 03/22/2015  . Depression 11/20/2014  . Prostate cancer ([place]) 03/05/2013  . Erectile dysfunction   . Hyperlipidemia 06/07/2007    Rexanne Mano, PT 01/07/2018, 12:31 PM  [facility] [address] [place], [place], [address] Phone: [phone]   Fax:  [phone]  Name: Corey Oneill MRN: [id number] Date of Birth: 1936/02/27

## 2018-01-11 ENCOUNTER — Encounter: Payer: Self-pay | Admitting: Physical Therapy

## 2018-01-11 ENCOUNTER — Ambulatory Visit: Payer: Medicare Other | Attending: Family Medicine | Admitting: Physical Therapy

## 2018-01-11 DIAGNOSIS — R262 Difficulty in walking, not elsewhere classified: Secondary | ICD-10-CM | POA: Insufficient documentation

## 2018-01-11 DIAGNOSIS — R42 Dizziness and giddiness: Secondary | ICD-10-CM

## 2018-01-11 DIAGNOSIS — R2681 Unsteadiness on feet: Secondary | ICD-10-CM | POA: Insufficient documentation

## 2018-01-11 NOTE — Therapy (Signed)
[facility] [address] [place], [place], [address] Phone: [phone]   Fax:  [phone]  Physical Therapy Treatment  Patient Details  Name: Corey Oneill MRN: [id number] Date of Birth: 1936-04-19 Referring Provider: [name] MD   Encounter Date: 01/11/2018  PT End of Session - 01/11/18 1320    Visit Number  12    Number of Visits  21 per re-cert 6/38    Date for PT Re-Evaluation  03/05/18    Authorization Type  MCR; AARP    Authorization Time Period  11/28/17 to 01/27/2018; 12/07/17 to 02/05/18; 01/04/18 to 03/05/18    PT Start Time  0852 pt arrived late    PT Stop Time  0930    PT Time Calculation (min)  38 min    Activity Tolerance  Patient tolerated treatment well    Behavior During Therapy  [facility] for tasks assessed/performed       Past Medical History:  Diagnosis Date  . Anginal pain ([place])   . Aortic stenosis    a. peak to peak gradient by LHC 8/16:  37 mmHg (moderately severe)   . CAD (coronary artery disease)    a.  LHC 8/16: Mid to distal LAD 30%, OM1 40%, proximal mid RCA 40%, distal RCA 60% >> FFR 0.69  >> PCI: 3 x 15 mm Resolute DES  . Depression   . Esophageal reflux   . Family history of adverse reaction to anesthesia    "daughter has PONV"  . H/O blood clots    "get them in my stool and urine" (11/12/2015)  . Heart murmur   . Hx of radiation therapy 04/14/13-06/09/13   prostate 7800 cGy, 40 sessions, seminal vesicles 5600 cGy 40 sessions  . Hyperlipidemia   . Malignant melanoma in situ ([place]) 09/04/2016   Right neck and chest  . Prostate cancer ([place]) dx'd 2014  . Radiation proctitis     Past Surgical History:  Procedure Laterality Date  . CARDIAC CATHETERIZATION N/A 04/21/2015   Procedure: Right/Left Heart Cath and Coronary Angiography;  Surgeon: [name], MD;  Location: [facility];  Service: Cardiovascular;  Laterality: N/A;  . CARDIAC CATHETERIZATION N/A 06/18/2015   Procedure:  Intravascular Pressure Wire/FFR Study;  Surgeon: [name], MD;  Location: [facility];  Service: Cardiovascular;  Laterality: N/A;  . CARDIAC CATHETERIZATION N/A 06/18/2015   Procedure: Coronary Stent Intervention;  Surgeon: [name], MD;  Location: [facility];  Service: Cardiovascular;  Laterality: N/A;  . CARDIAC CATHETERIZATION N/A 06/18/2015   Procedure: Right/Left Heart Cath and Coronary Angiography;  Surgeon: [name], MD;  Location: [facility];  Service: Cardiovascular;  Laterality: N/A;  . CARDIAC CATHETERIZATION N/A 06/23/2015   Procedure: Left Heart Cath and Cors/Grafts Angiography;  Surgeon: [name], MD;  Location: [facility] [facility];  Service: Cardiovascular;  Laterality: N/A;  . CARDIAC CATHETERIZATION N/A 01/17/2016   Procedure: Right/Left Heart Cath and Coronary Angiography;  Surgeon: [name], MD;  Location: [facility];  Service: Cardiovascular;  Laterality: N/A;  . COLONOSCOPY    . FRACTURE SURGERY    . INGUINAL HERNIA REPAIR    . LAPAROSCOPIC APPENDECTOMY N/A 11/16/2015   Procedure: APPENDECTOMY LAPAROSCOPIC;  Surgeon: [name], MD;  Location: [facility];  Service: General;  Laterality: N/A;  . LEFT HEART CATH AND CORONARY ANGIOGRAPHY N/A 12/26/2016   Procedure: Left Heart Cath and Coronary Angiography;  Surgeon: [name]  Floyce Stakes, MD;  Location: [facility];  Service: Cardiovascular;  Laterality: N/A;  . MELANOMA EXCISION Right 10/24/2016   Procedure: WIDE EXCISION MELANOMA RIGHT NECK AND RIGHT CHEST TIMES 2;  Surgeon: [name], MD;  Location: [facility];  Service: General;  Laterality: Right;  right medial and lateral lesion of chest and right neck  . NASAL FRACTURE SURGERY     "broken years ago; several ORs to correct it"  . NASAL SEPTUM SURGERY    . PROSTATE BIOPSY  2014   "needle biopsy"  . TEE WITHOUT CARDIOVERSION N/A 02/15/2016   Procedure: TRANSESOPHAGEAL ECHOCARDIOGRAM (TEE);  Surgeon: [name], MD;  Location: [facility];  Service: Open Heart Surgery;  Laterality: N/A;  . TONSILLECTOMY    . TRANSCATHETER AORTIC VALVE REPLACEMENT, TRANSFEMORAL  02/15/2016  . TRANSCATHETER AORTIC VALVE REPLACEMENT, TRANSFEMORAL N/A 02/15/2016   Procedure: TRANSCATHETER AORTIC VALVE REPLACEMENT, TRANSFEMORAL;  Surgeon: [name], MD;  Location: [facility];  Service: Open Heart Surgery;  Laterality: N/A;    There were no vitals filed for this visit.  Subjective Assessment - 01/11/18 0854    Subjective  Having lightheadedness when sat down. Was able to relieve with gaze fixation.    Pertinent History  CAD, aortic stenosis, depression, prostate cancer, melanoma    Patient Stated Goals  to not fall; feel steady on his feet    Currently in Pain?  No/denies                      [facility] Adult PT Treatment/Exercise - 01/11/18 1310      Ambulation/Gait   Ambulation/Gait  Yes    Ambulation/Gait Assistance  7: Independent    Ambulation/Gait Assistance Details  tossing ball hand to hand (through field of vision); naming foods via alphabet; no imbalance noted, did not have to stop to think    Ambulation Distance (Feet)  600 Feet    Assistive device  None    Gait Pattern  Step-through pattern;Wide base of support;Right flexed knee in stance;Left flexed knee in stance    Ambulation Surface  Indoor          Balance Exercises - 01/11/18 1312      Balance Exercises: Standing   Standing, One Foot on a Step  Eyes open;Head turns;Foam/compliant surface;6 inch on red mat    Step Ups  Forward;Intermittent UE support 7"    Balance Beam  black beam on red mat in // bars; progressed to head turns rt/lt with no UE support or LOB!    Tandem Gait  Forward;Intermittent upper extremity support;4 reps red mat    Retro Gait  Foam/compliant surface;Upper extremity support;4 reps red mat    Sidestepping  Foam/compliant support;4 reps red mat        PT Education - 01/11/18 1319    Education provided   Yes    Education Details  see addition to HEP; use of gaze fixation when he feels "light headed" to continue to see if this helps    Person(s) Educated  Patient    Methods  Explanation;Demonstration;Handout    Comprehension  Verbalized understanding;Returned demonstration;Need further instruction          PT Long Term Goals - 01/04/18 1920      PT LONG TERM GOAL #1   Title  Patient will be independent with HEP for vertigo and balance deficits. (Target for LTGs 12/13/17) (TARGET for unmet or new LTGs 01/06/18)    Status  Achieved      PT LONG TERM GOAL #2   Title  Patient will have no vertigo with positional testing.     Status  Achieved      PT LONG TERM GOAL #3   Title  Patient will self-report at least 50% improvement in overall balance.     Baseline  12/07/17 pt reports 25% improvement; 01/04/18 pt continues to feel balance 25% improved    Status  Not Met      PT LONG TERM GOAL #4   Title  Patient will complete DHI and set goal for LTG as appropriate.    Baseline  DHI given to pt on 12-17-17 to complete; 12/21/17 DHI 20% (mild impairment)    Status  Achieved      PT LONG TERM GOAL #5   Title  Patient's DHI score will decrease to <=16% (below lowest threshold for mild impairment). Target 01/06/18    Baseline  01/04/18  16%    Status  Achieved      Additional Long Term Goals   Additional Long Term Goals  Yes      PT LONG TERM GOAL #6   Title  Patient will be independent with updated HEP to address balance deficits and potential vestibular hypofunction. (Target 02/03/18)    Time  4    Period  Weeks    Status  New    Target Date  02/03/18      PT LONG TERM GOAL #7   Title  Patient will improve FGA score to >=26/30 to indicate lesser fall risk and improved balance/gait.     Baseline  01/04/18  21/30    Time  4    Period  Weeks    Status  New      PT LONG TERM GOAL #8   Title  Patient will be able to verbalize a plan for community-based exercise upon discharge from PT.      Time  4    Period  Weeks    Status  New            Plan - 01/11/18 1322    Clinical Impression Statement  Session focused on review and updating HEP to focus on balance training. Continued with balance training and dual-task gait training. Patient reported light-headedness x 1 during session and encouraged to use gaze fixation with pt reporting beneficial (appears symptoms of vestibular hypofunction may persist). Patient can continue to benefit from PT to improve balance and safety.     Rehab Potential  Good    Clinical Impairments Affecting Rehab Potential  limited cervical ROM;     PT Frequency  2x / week    PT Duration  4 weeks    PT Treatment/Interventions  ADLs/Self Care Home Management;Canalith Repostioning;Gait training;Functional mobility training;Therapeutic activities;Therapeutic exercise;Balance training;Neuromuscular re-education;Manual techniques;Patient/family education;Passive range of motion;Visual/perceptual remediation/compensation;Vestibular;DME Instruction    PT Next Visit Plan  check his corner balance ex's on foam for HEP- he says they are getting easy; stepping strategies, work on compliant surfaces (?red mat down in kitchen and taking dishes out of cabinet to put in dishwasher and then put back)    PT Home Exercise Plan  instructed in ankle pumps and marching in place to assist in decreasing light-headedness with change in position; added standing on foam with EC on 12-04-17; gave sheet with balance on foam (4 positions) for HEP    Consulted and Agree with Plan of Care  Patient  Patient will benefit from skilled therapeutic intervention in order to improve the following deficits and impairments:  Abnormal gait, Decreased activity tolerance, Decreased balance, Decreased range of motion, Dizziness, Postural dysfunction, Decreased mobility, Impaired perceived functional ability  Visit Diagnosis: Unsteadiness on feet  Dizziness and giddiness  Difficulty in  walking, not elsewhere classified     Problem List Patient Active Problem List   Diagnosis Date Noted  . Radiation proctitis   . Hx of radiation therapy   . Heart murmur   . H/O blood clots   . Family history of adverse reaction to anesthesia   . CAD (coronary artery disease)   . Aortic stenosis   . Anginal pain ([place])   . History of ITP 02/26/2017  . Malignant melanoma of neck ([place]) 10/02/2016  . 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 S/P appendectomy 11/12/2015  . Coronary artery disease   . Esophageal reflux 03/22/2015  . Depression 11/20/2014  . Prostate cancer ([place]) 03/05/2013  . Erectile dysfunction   . Hyperlipidemia 06/07/2007    Rexanne Mano, PT 01/11/2018, 1:27 PM  [facility] [address] [place], [place], [address] Phone: [phone]   Fax:  [phone]  Name: CHANTZ MONTEFUSCO MRN: [id number] Date of Birth: [birth year]

## 2018-01-11 NOTE — Patient Instructions (Signed)
FUNCTIONAL MOBILITY: Heel to Toe Walking    Walk beside counter with fingertips of one hand on counter, touching heel of one foot to toes of other. __6_ reps per set, _1__ sets per day, __5_ days per week   Copyright  VHI. All rights reserved.

## 2018-01-18 ENCOUNTER — Ambulatory Visit: Payer: Medicare Other | Admitting: Physical Therapy

## 2018-01-18 ENCOUNTER — Encounter: Payer: Self-pay | Admitting: Physical Therapy

## 2018-01-18 DIAGNOSIS — R2681 Unsteadiness on feet: Secondary | ICD-10-CM

## 2018-01-18 DIAGNOSIS — R262 Difficulty in walking, not elsewhere classified: Secondary | ICD-10-CM | POA: Diagnosis not present

## 2018-01-18 DIAGNOSIS — R42 Dizziness and giddiness: Secondary | ICD-10-CM | POA: Diagnosis not present

## 2018-01-18 NOTE — Patient Instructions (Signed)
  Feet Apart (Compliant Surface) Head Motion - Eyes Closed    Stand on compliant surface (pillow/folded blanket)with feet shoulder width apart. Close eyes and move head slowly, up and down x 10 and left and right x 10. If able turn head diagonally, both left and right Repeat 1 times per session. Do 1-2 sessions per day.                       Feet Together (Compliant Surface) Head Motion - Eyes Open    With eyes open, standing on compliant surface: (pillow/folded blanket)with feet shoulder width apart. Close eyes and move head slowly, up and down x 10 and left and right x 10. If able turn head diagonally, both left and right Repeat 1 times per session. Do 1-2 sessions per day.  Copyright  VHI. All rights reserved.

## 2018-01-19 NOTE — Therapy (Signed)
[facility] [address] [place], [place], [address] Phone: [phone]   Fax:  [phone]  Physical Therapy Treatment  Patient Details  Name: Corey Oneill MRN: [id number] Date of Birth: 07-Nov-1936 Referring Provider: [name] MD   Encounter Date: 01/18/2018  PT End of Session - 01/18/18 1915    Visit Number  13    Number of Visits  21 per re-cert 2/02    Date for PT Re-Evaluation  03/05/18    Authorization Type  MCR; AARP    Authorization Time Period  11/28/17 to 01/27/2018; 12/07/17 to 02/05/18; 01/04/18 to 03/05/18    PT Start Time  0941 pt arrived late    PT Stop Time  1015    PT Time Calculation (min)  34 min    Activity Tolerance  Patient tolerated treatment well    Behavior During Therapy  [facility] for tasks assessed/performed       Past Medical History:  Diagnosis Date  . Anginal pain ([name])   . Aortic stenosis    a. peak to peak gradient by LHC 8/16:  37 mmHg (moderately severe)   . CAD (coronary artery disease)    a.  LHC 8/16: Mid to distal LAD 30%, OM1 40%, proximal mid RCA 40%, distal RCA 60% >> FFR 0.69  >> PCI: 3 x 15 mm Resolute DES  . Depression   . Esophageal reflux   . Family history of adverse reaction to anesthesia    "daughter has PONV"  . H/O blood clots    "get them in my stool and urine" (11/12/2015)  . Heart murmur   . Hx of radiation therapy 04/14/13-06/09/13   prostate 7800 cGy, 40 sessions, seminal vesicles 5600 cGy 40 sessions  . Hyperlipidemia   . 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 Prostate cancer ([place]) dx'd 2014  . Radiation proctitis     Past Surgical History:  Procedure Laterality Date  . CARDIAC CATHETERIZATION N/A 04/21/2015   Procedure: Right/Left Heart Cath and Coronary Angiography;  Surgeon: [name], MD;  Location: [place];  Service: Cardiovascular;  Laterality: N/A;  . CARDIAC CATHETERIZATION N/A 06/18/2015   Procedure:  Intravascular Pressure Wire/FFR Study;  Surgeon: [name], MD;  Location: [facility];  Service: Cardiovascular;  Laterality: N/A;  . CARDIAC CATHETERIZATION N/A 06/18/2015   Procedure: Coronary Stent Intervention;  Surgeon: [name], MD;  Location: [facility];  Service: Cardiovascular;  Laterality: N/A;  . CARDIAC CATHETERIZATION N/A 06/18/2015   Procedure: Right/Left Heart Cath and Coronary Angiography;  Surgeon: [name], MD;  Location: [facility];  Service: Cardiovascular;  Laterality: N/A;  . CARDIAC CATHETERIZATION N/A 06/23/2015   Procedure: Left Heart Cath and Cors/Grafts Angiography;  Surgeon: [name], MD;  Location: [facility];  Service: Cardiovascular;  Laterality: N/A;  . CARDIAC CATHETERIZATION N/A 01/17/2016   Procedure: Right/Left Heart Cath and Coronary Angiography;  Surgeon: [name], MD;  Location: [facility];  Service: Cardiovascular;  Laterality: N/A;  . COLONOSCOPY    . FRACTURE SURGERY    . INGUINAL HERNIA REPAIR    . LAPAROSCOPIC APPENDECTOMY N/A 11/16/2015   Procedure: APPENDECTOMY LAPAROSCOPIC;  Surgeon: [name], MD;  Location: [facility];  Service: General;  Laterality: N/A;  . LEFT HEART CATH AND CORONARY ANGIOGRAPHY N/A 12/26/2016   Procedure: Left Heart Cath and Coronary Angiography;  Surgeon: [name]  Floyce Stakes, MD;  Location: [facility];  Service: Cardiovascular;  Laterality: N/A;  . MELANOMA EXCISION Right 10/24/2016   Procedure: WIDE EXCISION MELANOMA RIGHT NECK AND RIGHT CHEST TIMES 2;  Surgeon: [name], MD;  Location: [facility];  Service: General;  Laterality: Right;  right medial and lateral lesion of chest and right neck  . NASAL FRACTURE SURGERY     "broken years ago; several ORs to correct it"  . NASAL SEPTUM SURGERY    . PROSTATE BIOPSY  2014   "needle biopsy"  . TEE WITHOUT CARDIOVERSION N/A 02/15/2016   Procedure: TRANSESOPHAGEAL ECHOCARDIOGRAM (TEE);  Surgeon: [name], MD;  Location: [facility];  Service: Open Heart Surgery;  Laterality: N/A;  . TONSILLECTOMY    . TRANSCATHETER AORTIC VALVE REPLACEMENT, TRANSFEMORAL  02/15/2016  . TRANSCATHETER AORTIC VALVE REPLACEMENT, TRANSFEMORAL N/A 02/15/2016   Procedure: TRANSCATHETER AORTIC VALVE REPLACEMENT, TRANSFEMORAL;  Surgeon: [name], MD;  Location: [facility];  Service: Open Heart Surgery;  Laterality: N/A;    There were no vitals filed for this visit.  Subjective Assessment - 01/18/18 0942    Subjective  Admits he is still not doing his exercises. "I'm not haivng episodes of dizziness. I don't want to waste your time if I'm not going to do my part." Reports he almost fell going into a store while opening the door. He caught himself on the door handle. Not sure what happened but thinks he stepped on his own foot or his feet got tangled up.     Pertinent History  CAD, aortic stenosis, depression, prostate cancer, melanoma    Patient Stated Goals  to not fall; feel steady on his feet    Currently in Pain?  No/denies        Neuromuscular re-ed- Educated on loss of foot sensation per monofilament testing and implications. Educated on how use of cane can help by giving him a point of contact with the ground that he can feel through his hand. Corner balance activities on blue airex: EO feet apart, together, semi-tandem; head turns; EC feet apart, together, semi-tandem with no UE support vs with single finger in contact with chair back. Patient with significantly decreased sway with single finger support and educated again on how cane would be beneficial.  Gait training- with cane , sequencing, use in either hand as he just needs UE sensory input not using for any LE deficit.                        PT Education - 01/18/18 1912    Education provided  Yes    Education Details  results of testing sensation in his feet with 10g monofilament and would benefit from use of a cane; see updated HEP  pictures to clarify corner exercises    Person(s) Educated  Patient    Methods  Explanation;Demonstration;Handout    Comprehension  Verbalized understanding;Returned demonstration          PT Long Term Goals - 01/04/18 1920      PT LONG TERM GOAL #1   Title  Patient will be independent with HEP for vertigo and balance deficits. (Target for LTGs 12/13/17) (TARGET for unmet or new LTGs 01/06/18)    Status  Achieved      PT LONG TERM GOAL #2   Title  Patient will have no vertigo with positional testing.     Status  Achieved      PT  LONG TERM GOAL #3   Title  Patient will self-report at least 50% improvement in overall balance.     Baseline  12/07/17 pt reports 25% improvement; 01/04/18 pt continues to feel balance 25% improved    Status  Not Met      PT LONG TERM GOAL #4   Title  Patient will complete DHI and set goal for LTG as appropriate.    Baseline  DHI given to pt on 12-17-17 to complete; 12/21/17 DHI 20% (mild impairment)    Status  Achieved      PT LONG TERM GOAL #5   Title  Patient's DHI score will decrease to <=16% (below lowest threshold for mild impairment). Target 01/06/18    Baseline  01/04/18  16%    Status  Achieved      Additional Long Term Goals   Additional Long Term Goals  Yes      PT LONG TERM GOAL #6   Title  Patient will be independent with updated HEP to address balance deficits and potential vestibular hypofunction. (Target 02/03/18)    Time  4    Period  Weeks    Status  New    Target Date  02/03/18      PT LONG TERM GOAL #7   Title  Patient will improve FGA score to >=26/30 to indicate lesser fall risk and improved balance/gait.     Baseline  01/04/18  21/30    Time  4    Period  Weeks    Status  New      PT LONG TERM GOAL #8   Title  Patient will be able to verbalize a plan for community-based exercise upon discharge from PT.     Time  4    Period  Weeks    Status  New            Plan - 01/18/18 1917    Clinical Impression Statement   After pt described a near fall where his feet "tangled up" assessed his sensation in bil feet with pt only able to detect 10g monofilament in 3 of 10 places on rt foot (none at plantar surface of toes) and 5 of 10 in left foot (+at his toes). He was not ready to start using a cane (and admitted he has been struggling with turning 82 yo recently and thinking about using a cane just makes him feel bad). He did agree to try a clinic cane and did not feel it added much assistance for his balance. During balance activities on foam with eyes closed, he did notice a significant decrease in his sway when he had one finger touching the chair back vs no UE support. He may continue to benefit from instruction and importance in the use of a cane.    Rehab Potential  Good    Clinical Impairments Affecting Rehab Potential  limited cervical ROM;     PT Frequency  2x / week    PT Duration  4 weeks    PT Treatment/Interventions  ADLs/Self Care Home Management;Canalith Repostioning;Gait training;Functional mobility training;Therapeutic activities;Therapeutic exercise;Balance training;Neuromuscular re-education;Manual techniques;Patient/family education;Passive range of motion;Visual/perceptual remediation/compensation;Vestibular;DME Instruction    PT Next Visit Plan  he stated next visit may be his last since he is "not doing what I'm supposed to be doing and don't want to waste your time" then check LTGs, discuss [facility] classes for balance, and d/c; stepping strategies, work on compliant surfaces (?red mat down in kitchen and taking  dishes out of cabinet to put in dishwasher and then put back); ?discuss use of cane again;     PT Home Exercise Plan  instructed in ankle pumps and marching in place to assist in decreasing light-headedness with change in position; added standing on foam with EC on 12-04-17; gave sheet with balance on foam (4 positions) for HEP    Consulted and Agree with Plan of Care  Patient        Patient will benefit from skilled therapeutic intervention in order to improve the following deficits and impairments:  Abnormal gait, Decreased activity tolerance, Decreased balance, Decreased range of motion, Dizziness, Postural dysfunction, Decreased mobility, Impaired perceived functional ability  Visit Diagnosis: Unsteadiness on feet  Difficulty in walking, not elsewhere classified     Problem List Patient Active Problem List   Diagnosis Date Noted  . Radiation proctitis   . Hx of radiation therapy   . Heart murmur   . H/O blood clots   . Family history of adverse reaction to anesthesia   . CAD (coronary artery disease)   . Aortic stenosis   . Anginal pain ([place])   . History of ITP 02/26/2017  . 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 S/P appendectomy 11/12/2015  . Coronary artery disease   . Esophageal reflux 03/22/2015  . Depression 11/20/2014  . Prostate cancer ([place]) 03/05/2013  . Erectile dysfunction   . Hyperlipidemia 06/07/2007    Rexanne Mano, PT 01/19/2018, 6:49 AM  [facility] [address] [place], [place], [address] Phone: [phone]   Fax:  [phone]  Name: Corey Oneill MRN: [id number] Date of Birth: 31-Jan-1936

## 2018-01-21 ENCOUNTER — Ambulatory Visit: Payer: Medicare Other | Admitting: Physical Therapy

## 2018-01-21 DIAGNOSIS — R2681 Unsteadiness on feet: Secondary | ICD-10-CM | POA: Diagnosis not present

## 2018-01-21 DIAGNOSIS — R42 Dizziness and giddiness: Secondary | ICD-10-CM | POA: Diagnosis not present

## 2018-01-21 DIAGNOSIS — R262 Difficulty in walking, not elsewhere classified: Secondary | ICD-10-CM | POA: Diagnosis not present

## 2018-01-21 NOTE — Therapy (Signed)
[facility] [address] [place], [place], [address] Phone: [phone]   Fax:  [phone]  Physical Therapy Treatment  Patient Details  Name: Corey Oneill MRN: [id number] Date of Birth: January 06, 1936 Referring Provider: [name] MD   Encounter Date: 01/21/2018  PT End of Session - 01/22/18 0831    Visit Number  14    Number of Visits  21    Date for PT Re-Evaluation  03/05/18    Authorization Type  MCR; AARP    Authorization Time Period  11/28/17 to 01/27/2018; 12/07/17 to 02/05/18; 01/04/18 to 03/05/18    PT Start Time  1016    PT Stop Time  1100    PT Time Calculation (min)  44 min       Past Medical History:  Diagnosis Date  . Anginal pain ([place])   . Aortic stenosis    a. peak to peak gradient by LHC 8/16:  37 mmHg (moderately severe)   . CAD (coronary artery disease)    a.  LHC 8/16: Mid to distal LAD 30%, OM1 40%, proximal mid RCA 40%, distal RCA 60% >> FFR 0.69  >> PCI: 3 x 15 mm Resolute DES  . Depression   . Esophageal reflux   . Family history of adverse reaction to anesthesia    "daughter has PONV"  . H/O blood clots    "get them in my stool and urine" (11/12/2015)  . Heart murmur   . Hx of radiation therapy 04/14/13-06/09/13   prostate 7800 cGy, 40 sessions, seminal vesicles 5600 cGy 40 sessions  . Hyperlipidemia   . Malignant melanoma in situ ([place]) 09/04/2016   Right neck and chest  . Prostate cancer ([place]) dx'd 2014  . Radiation proctitis     Past Surgical History:  Procedure Laterality Date  . CARDIAC CATHETERIZATION N/A 04/21/2015   Procedure: Right/Left Heart Cath and Coronary Angiography;  Surgeon: [name], MD;  Location: [facility];  Service: Cardiovascular;  Laterality: N/A;  . CARDIAC CATHETERIZATION N/A 06/18/2015   Procedure: Intravascular Pressure Wire/FFR Study;  Surgeon: [name], MD;  Location: [facility];  Service: Cardiovascular;  Laterality: N/A;  .  CARDIAC CATHETERIZATION N/A 06/18/2015   Procedure: Coronary Stent Intervention;  Surgeon: [name], MD;  Location: [facility];  Service: Cardiovascular;  Laterality: N/A;  . CARDIAC CATHETERIZATION N/A 06/18/2015   Procedure: Right/Left Heart Cath and Coronary Angiography;  Surgeon: [name], MD;  Location: [facility];  Service: Cardiovascular;  Laterality: N/A;  . CARDIAC CATHETERIZATION N/A 06/23/2015   Procedure: Left Heart Cath and Cors/Grafts Angiography;  Surgeon: [name], MD;  Location: [facility];  Service: Cardiovascular;  Laterality: N/A;  . CARDIAC CATHETERIZATION N/A 01/17/2016   Procedure: Right/Left Heart Cath and Coronary Angiography;  Surgeon: [name], MD;  Location: [facility];  Service: Cardiovascular;  Laterality: N/A;  . COLONOSCOPY    . FRACTURE SURGERY    . INGUINAL HERNIA REPAIR    . LAPAROSCOPIC APPENDECTOMY N/A 11/16/2015   Procedure: APPENDECTOMY LAPAROSCOPIC;  Surgeon: [name], MD;  Location: [facility];  Service: General;  Laterality: N/A;  . LEFT HEART CATH AND CORONARY ANGIOGRAPHY N/A 12/26/2016   Procedure: Left Heart Cath and Coronary Angiography;  Surgeon: Troy Sine, MD;  Location: [facility];  Service: Cardiovascular;  Laterality: N/A;  . MELANOMA EXCISION Right 10/24/2016   Procedure: WIDE EXCISION MELANOMA  RIGHT NECK AND RIGHT CHEST TIMES 2;  Surgeon: [name], MD;  Location: [facility];  Service: General;  Laterality: Right;  right medial and lateral lesion of chest and right neck  . NASAL FRACTURE SURGERY     "broken years ago; several ORs to correct it"  . NASAL SEPTUM SURGERY    . PROSTATE BIOPSY  2014   "needle biopsy"  . TEE WITHOUT CARDIOVERSION N/A 02/15/2016   Procedure: TRANSESOPHAGEAL ECHOCARDIOGRAM (TEE);  Surgeon: [name], MD;  Location: [facility];  Service: Open Heart Surgery;  Laterality: N/A;  . TONSILLECTOMY    . TRANSCATHETER AORTIC VALVE REPLACEMENT,  TRANSFEMORAL  02/15/2016  . TRANSCATHETER AORTIC VALVE REPLACEMENT, TRANSFEMORAL N/A 02/15/2016   Procedure: TRANSCATHETER AORTIC VALVE REPLACEMENT, TRANSFEMORAL;  Surgeon: [name], MD;  Location: [facility];  Service: Open Heart Surgery;  Laterality: N/A;    There were no vitals filed for this visit.  Subjective Assessment - 01/22/18 0827    Subjective  Pt states he still hasn't done his exercises but is going to try to do better; thinks he probably wants to finish up today but is not sure; is still undecided about the cane    Pertinent History  CAD, aortic stenosis, depression, prostate cancer, melanoma    Patient Stated Goals  to not fall; feel steady on his feet    Currently in Pain?  No/denies         12.10 secs TUG with no device             [facility] Adult PT Treatment/Exercise - 01/22/18 0001      Ambulation/Gait   Ambulation/Gait  Yes    Ambulation/Gait Assistance  5: Supervision    Ambulation/Gait Assistance Details  cues for correct sequence with use of cane; instructed pt how to correctly adjust cane should he get a new one    Ambulation Distance (Feet)  100 Feet    Assistive device  Straight cane    Gait Pattern  Step-through pattern;Wide base of support;Right flexed knee in stance;Left flexed knee in stance    Ambulation Surface  Level;Indoor       Pt performed all exercises in HEP for review - pt performed standing on 2 pillows in corner with EO and EC with horizontal And vertical head turns - with feet apart and together Pt performed amb. 17' with horizontal head turns, 25' with vertical head turns with SBA - pt informed that this activity could  Be incorporated into daily activities with walking in the home and did not have to be a separate exercise  Pt performed tandem stance - 30 sec hold each foot position - with UE support prn SLS on each leg - 10 sec hold with UE support majority of the time due to difficulty with this activity      PT Education  - 01/22/18 0829    Education provided  Yes    Education Details  reveiwed HEP and condensed exercises for pt; instructed in how to adjust cane for correct height should he decide to purchase a new one; added tandem stance - 30 secs    Person(s) Educated  Patient    Methods  Explanation;Demonstration pt already had handout of HEP    Comprehension  Verbalized understanding;Returned demonstration          PT Long Term Goals - 01/04/18 1920      PT LONG TERM GOAL #1   Title  Patient will be independent with HEP for  vertigo and balance deficits. (Target for LTGs 12/13/17) (TARGET for unmet or new LTGs 01/06/18)    Status  Achieved      PT LONG TERM GOAL #2   Title  Patient will have no vertigo with positional testing.     Status  Achieved      PT LONG TERM GOAL #3   Title  Patient will self-report at least 50% improvement in overall balance.     Baseline  12/07/17 pt reports 25% improvement; 01/04/18 pt continues to feel balance 25% improved    Status  Not Met      PT LONG TERM GOAL #4   Title  Patient will complete DHI and set goal for LTG as appropriate.    Baseline  DHI given to pt on 12-17-17 to complete; 12/21/17 DHI 20% (mild impairment)    Status  Achieved      PT LONG TERM GOAL #5   Title  Patient's DHI score will decrease to <=16% (below lowest threshold for mild impairment). Target 01/06/18    Baseline  01/04/18  16%    Status  Achieved      Additional Long Term Goals   Additional Long Term Goals  Yes      PT LONG TERM GOAL #6   Title  Patient will be independent with updated HEP to address balance deficits and potential vestibular hypofunction. (Target 02/03/18)    Time  4    Period  Weeks    Status  New    Target Date  02/03/18      PT LONG TERM GOAL #7   Title  Patient will improve FGA score to >=26/30 to indicate lesser fall risk and improved balance/gait.     Baseline  01/04/18  21/30    Time  4    Period  Weeks    Status  New      PT LONG TERM GOAL #8   Title   Patient will be able to verbalize a plan for community-based exercise upon discharge from PT.     Time  4    Period  Weeks    Status  New            Plan - 01/22/18 4136    Clinical Impression Statement  Pt decided at end of session to continue with PT for remaining scheduled appts and not discharge at this time, with pt stating he is going to start doing HEP at home as instructed.  Pt agreeable to use of cane, but declined going outside for gait training with cane for practicce with amb. on uneven surfaces.  Rehab Potential  Good    Clinical Impairments Affecting Rehab Potential  limited cervical ROM;     PT Frequency  2x / week    PT Duration  4 weeks    PT Treatment/Interventions  ADLs/Self Care Home Management;Canalith Repostioning;Gait training;Functional mobility training;Therapeutic activities;Therapeutic exercise;Balance training;Neuromuscular re-education;Manual techniques;Patient/family education;Passive range of motion;Visual/perceptual remediation/compensation;Vestibular;DME Instruction    PT Next Visit Plan   stepping strategies, work on compliant surfaces (?red mat down in kitchen and taking dishes out of cabinet to put in dishwasher and then put back); ?discuss use of cane again;     PT Home Exercise Plan  instructed in ankle pumps and marching in place to assist in decreasing light-headedness with change in position; added standing on foam with EC on 12-04-17; gave sheet with balance on foam (4 positions) for HEP    Consulted and Agree with Plan of Care  Patient       Patient will benefit from skilled therapeutic intervention in order to improve the following deficits and impairments:  Abnormal gait, Decreased activity tolerance, Decreased balance, Decreased range of  motion, Dizziness, Postural dysfunction, Decreased mobility, Impaired perceived functional ability  Visit Diagnosis: Unsteadiness on feet  Difficulty in walking, not elsewhere classified     Problem List Patient Active Problem List   Diagnosis Date Noted  . Radiation proctitis   . Hx of radiation therapy   . Heart murmur   . H/O blood clots   . Family history of adverse reaction to anesthesia   . CAD (coronary artery disease)   . Aortic stenosis   . Anginal pain ([place])   . History of ITP 02/26/2017  . 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 S/P appendectomy 11/12/2015  . Coronary artery disease   . Esophageal reflux 03/22/2015  . Depression 11/20/2014  . Prostate cancer ([place]) 03/05/2013  . Erectile dysfunction   . Hyperlipidemia 06/07/2007    Alda Lea, PT 01/22/2018, 8:38 AM  [facility] [address] [place], [place], [address] Phone: [phone]   Fax:  [phone]  Name: Corey Oneill MRN: [id number] Date of Birth: October 12, 1936

## 2018-01-22 ENCOUNTER — Encounter: Payer: Self-pay | Admitting: Physical Therapy

## 2018-01-22 ENCOUNTER — Other Ambulatory Visit: Payer: Self-pay | Admitting: Family Medicine

## 2018-01-22 DIAGNOSIS — D696 Thrombocytopenia, unspecified: Secondary | ICD-10-CM

## 2018-01-22 NOTE — Telephone Encounter (Signed)
Is this okay to refill? 

## 2018-01-25 ENCOUNTER — Encounter: Payer: Self-pay | Admitting: Physical Therapy

## 2018-01-25 ENCOUNTER — Ambulatory Visit: Payer: Medicare Other | Admitting: Physical Therapy

## 2018-01-25 DIAGNOSIS — R262 Difficulty in walking, not elsewhere classified: Secondary | ICD-10-CM | POA: Diagnosis not present

## 2018-01-25 DIAGNOSIS — R2681 Unsteadiness on feet: Secondary | ICD-10-CM | POA: Diagnosis not present

## 2018-01-25 DIAGNOSIS — R42 Dizziness and giddiness: Secondary | ICD-10-CM | POA: Diagnosis not present

## 2018-01-25 NOTE — Therapy (Signed)
[facility] [address] [place], [place], [address] Phone: [phone]   Fax:  [phone]  Physical Therapy Treatment  Patient Details  Name: Corey Oneill MRN: [id number] Date of Birth: [birth year] Referring Provider: [name] MD   Encounter Date: 01/25/2018  PT End of Session - 01/25/18 0929    Visit Number  15    Number of Visits  21    Date for PT Re-Evaluation  03/05/18    Authorization Type  MCR; AARP    Authorization Time Period  11/28/17 to 01/27/2018; 12/07/17 to 02/05/18; 01/04/18 to 03/05/18    PT Start Time  0849 late arrival    PT Stop Time  0930    PT Time Calculation (min)  41 min       Past Medical History:  Diagnosis Date  . Anginal pain ([place])   . Aortic stenosis    a. peak to peak gradient by LHC 8/16:  37 mmHg (moderately severe)   . CAD (coronary artery disease)    a.  LHC 8/16: Mid to distal LAD 30%, OM1 40%, proximal mid RCA 40%, distal RCA 60% >> FFR 0.69  >> PCI: 3 x 15 mm Resolute DES  . Depression   . Esophageal reflux   . Family history of adverse reaction to anesthesia    "daughter has PONV"  . H/O blood clots    "get them in my stool and urine" (11/12/2015)  . Heart murmur   . Hx of radiation therapy 04/14/13-06/09/13   prostate 7800 cGy, 40 sessions, seminal vesicles 5600 cGy 40 sessions  . Hyperlipidemia   . 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 Prostate cancer ([place]) dx'd 2014  . Radiation proctitis     Past Surgical History:  Procedure Laterality Date  . CARDIAC CATHETERIZATION N/A 04/21/2015   Procedure: Right/Left Heart Cath and Coronary Angiography;  Surgeon: [name], MD;  Location: [facility];  Service: Cardiovascular;  Laterality: N/A;  . CARDIAC CATHETERIZATION N/A 06/18/2015   Procedure: Intravascular Pressure Wire/FFR Study;  Surgeon: [name], MD;  Location: [facility];  Service: Cardiovascular;  Laterality:  N/A;  . CARDIAC CATHETERIZATION N/A 06/18/2015   Procedure: Coronary Stent Intervention;  Surgeon: [name], MD;  Location: [facility];  Service: Cardiovascular;  Laterality: N/A;  . CARDIAC CATHETERIZATION N/A 06/18/2015   Procedure: Right/Left Heart Cath and Coronary Angiography;  Surgeon: [name], MD;  Location: [facility];  Service: Cardiovascular;  Laterality: N/A;  . CARDIAC CATHETERIZATION N/A 06/23/2015   Procedure: Left Heart Cath and Cors/Grafts Angiography;  Surgeon: [name], MD;  Location: [facility];  Service: Cardiovascular;  Laterality: N/A;  . CARDIAC CATHETERIZATION N/A 01/17/2016   Procedure: Right/Left Heart Cath and Coronary Angiography;  Surgeon: [name], MD;  Location: [facility];  Service: Cardiovascular;  Laterality: N/A;  . COLONOSCOPY    . FRACTURE SURGERY    . INGUINAL HERNIA REPAIR    . LAPAROSCOPIC APPENDECTOMY N/A 11/16/2015   Procedure: APPENDECTOMY LAPAROSCOPIC;  Surgeon: [name], MD;  Location: [facility];  Service: General;  Laterality: N/A;  . LEFT HEART CATH AND CORONARY ANGIOGRAPHY N/A 12/26/2016   Procedure: Left Heart Cath and Coronary Angiography;  Surgeon: Troy Sine, MD;  Location: [facility];  Service: Cardiovascular;  Laterality: N/A;  . MELANOMA EXCISION Right 10/24/2016   Procedure: WIDE  EXCISION MELANOMA RIGHT NECK AND RIGHT CHEST TIMES 2;  Surgeon: [name], MD;  Location: [facility];  Service: General;  Laterality: Right;  right medial and lateral lesion of chest and right neck  . NASAL FRACTURE SURGERY     "broken years ago; several ORs to correct it"  . NASAL SEPTUM SURGERY    . PROSTATE BIOPSY  2014   "needle biopsy"  . TEE WITHOUT CARDIOVERSION N/A 02/15/2016   Procedure: TRANSESOPHAGEAL ECHOCARDIOGRAM (TEE);  Surgeon: [name], MD;  Location: [facility];  Service: Open Heart Surgery;  Laterality: N/A;  . TONSILLECTOMY    . TRANSCATHETER AORTIC VALVE  REPLACEMENT, TRANSFEMORAL  02/15/2016  . TRANSCATHETER AORTIC VALVE REPLACEMENT, TRANSFEMORAL N/A 02/15/2016   Procedure: TRANSCATHETER AORTIC VALVE REPLACEMENT, TRANSFEMORAL;  Surgeon: [name], MD;  Location: [facility];  Service: Open Heart Surgery;  Laterality: N/A;    There were no vitals filed for this visit.  Subjective Assessment - 01/25/18 0849    Subjective  Went out and bought a cane after he left here. Has been using cane in community not in home. Not doing his HEP sitll. Recommended he place sticky notes in the kitchen and hallway as visual cues.   (Pended)     Pertinent History  CAD, aortic stenosis, depression, prostate cancer, melanoma  (Pended)     Patient Stated Goals  to not fall; feel steady on his feet  (Pended)     Currently in Pain?  No/denies  (Pended)          Treatment- Balance training--narrow BOS (SLS, tandem) on red mat in kitchen unloading diswasher and reaching to various shelfs, cabinets to put items away. Excellent balance strategies used with his minor imbalances. Pull and release test on red mat with pt hesitant to truly lean into PT's hands (did better when hands on posterior waist). Initially small steps backward with progress to single larger step.   Gait training with cane placement and addiing head turns (cane and no cane). Supervision x 1000 ft.                          PT Long Term Goals - 01/04/18 1920      PT LONG TERM GOAL #1   Title  Patient will be independent with HEP for vertigo and balance deficits. (Target for LTGs 12/13/17) (TARGET for unmet or new LTGs 01/06/18)    Status  Achieved      PT LONG TERM GOAL #2   Title  Patient will have no vertigo with positional testing.     Status  Achieved      PT LONG TERM GOAL #3   Title  Patient will self-report at least 50% improvement in overall balance.     Baseline  12/07/17 pt reports 25% improvement; 01/04/18 pt continues to feel balance 25% improved    Status  Not Met       PT LONG TERM GOAL #4   Title  Patient will complete DHI and set goal for LTG as appropriate.    Baseline  DHI given to pt on 12-17-17 to complete; 12/21/17 DHI 20% (mild impairment)    Status  Achieved      PT LONG TERM GOAL #5   Title  Patient's DHI score will decrease to <=16% (below lowest threshold for mild impairment). Target 01/06/18    Baseline  01/04/18  16%    Status  Achieved  Additional Long Term Goals   Additional Long Term Goals  Yes      PT LONG TERM GOAL #6   Title  Patient will be independent with updated HEP to address balance deficits and potential vestibular hypofunction. (Target 02/03/18)    Time  4    Period  Weeks    Status  New    Target Date  02/03/18      PT LONG TERM GOAL #7   Title  Patient will improve FGA score to >=26/30 to indicate lesser fall risk and improved balance/gait.     Baseline  01/04/18  21/30    Time  4    Period  Weeks    Status  New      PT LONG TERM GOAL #8   Title  Patient will be able to verbalize a plan for community-based exercise upon discharge from PT.     Time  4    Period  Weeks    Status  New            Plan - 01/25/18 1258    Clinical Impression Statement  Session included balance training on compliant surface with functional tasks in the kitchen with various foot positions for incr challenge. Patient did well with independent recovery with his small LOB's. Educated on proper use of cane and pt improving although his coordination in sequencing and placement are correct ~80% of the time. Also focused on gait with head turns and cognitive challenge with no LOB, very minimal drift off path and minimal slowing of velocity. Overall, pt is improving and states now that he has the cane, he is glad we talked him into getting it.     Rehab Potential  Good    Clinical Impairments Affecting Rehab Potential  limited cervical ROM;     PT Frequency  2x / week    PT Duration  4 weeks    PT Treatment/Interventions  ADLs/Self  Care Home Management;Canalith Repostioning;Gait training;Functional mobility training;Therapeutic activities;Therapeutic exercise;Balance training;Neuromuscular re-education;Manual techniques;Patient/family education;Passive range of motion;Visual/perceptual remediation/compensation;Vestibular;DME Instruction    PT Next Visit Plan   stepping strategies on compliant surfaces (pull and release test difficult for pt to allow COG outside BOS); ?hurdles on red mat, blue mat on ramp, outside unlevel surfaces with cane    PT Home Exercise Plan  instructed in ankle pumps and marching in place to assist in decreasing light-headedness with change in position; added standing on foam with EC on 12-04-17; gave sheet with balance on foam (4 positions) for HEP    Consulted and Agree with Plan of Care  Patient       Patient will benefit from skilled therapeutic intervention in order to improve the following deficits and impairments:  Abnormal gait, Decreased activity tolerance, Decreased balance, Decreased range of motion, Dizziness, Postural dysfunction, Decreased mobility, Impaired perceived functional ability  Visit Diagnosis: Unsteadiness on feet  Difficulty in walking, not elsewhere classified     Problem List Patient Active Problem List   Diagnosis Date Noted  . Radiation proctitis   . Hx of radiation therapy   . Heart murmur   . H/O blood clots   . Family history of adverse reaction to anesthesia   . CAD (coronary artery disease)   . Aortic stenosis   . Anginal pain ([place])   . History of ITP 02/26/2017  . 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 S/P appendectomy 11/12/2015  . Coronary artery disease   . Esophageal reflux 03/22/2015  . Depression 11/20/2014  . Prostate cancer ([place]) 03/05/2013  . Erectile dysfunction   . Hyperlipidemia 06/07/2007    Rexanne Mano,  PT 01/25/2018, 1:04 PM  [facility] [address] [place], [place], [address] Phone: [phone]   Fax:  [phone]  Name: Corey Oneill MRN: [id number] Date of Birth: 12-08-1935

## 2018-01-28 ENCOUNTER — Encounter: Payer: Self-pay | Admitting: Physical Therapy

## 2018-01-28 ENCOUNTER — Ambulatory Visit: Payer: Medicare Other | Admitting: Physical Therapy

## 2018-01-28 DIAGNOSIS — R262 Difficulty in walking, not elsewhere classified: Secondary | ICD-10-CM | POA: Diagnosis not present

## 2018-01-28 DIAGNOSIS — R2681 Unsteadiness on feet: Secondary | ICD-10-CM

## 2018-01-28 DIAGNOSIS — R42 Dizziness and giddiness: Secondary | ICD-10-CM | POA: Diagnosis not present

## 2018-01-28 DIAGNOSIS — C61 Malignant neoplasm of prostate: Secondary | ICD-10-CM | POA: Diagnosis not present

## 2018-01-28 NOTE — Therapy (Signed)
[facility] [address] [place], [place], [address] Phone: [phone]   Fax:  [phone]  Physical Therapy Treatment  Patient Details  Name: Corey Oneill MRN: [id number] Date of Birth: 1936/05/03 Referring Provider: [name] MD   Encounter Date: 01/28/2018  PT End of Session - 01/28/18 1020    Visit Number  16    Number of Visits  21    Date for PT Re-Evaluation  03/05/18    Authorization Type  MCR; AARP    Authorization Time Period  11/28/17 to 01/27/2018; 12/07/17 to 02/05/18; 01/04/18 to 03/05/18    PT Start Time  0933    PT Stop Time  1013    PT Time Calculation (min)  40 min    Equipment Utilized During Treatment  Gait belt    Activity Tolerance  Patient tolerated treatment well    Behavior During Therapy  [facility] for tasks assessed/performed       Past Medical History:  Diagnosis Date  . Anginal pain ([place])   . Aortic stenosis    a. peak to peak gradient by LHC 8/16:  37 mmHg (moderately severe)   . CAD (coronary artery disease)    a.  LHC 8/16: Mid to distal LAD 30%, OM1 40%, proximal mid RCA 40%, distal RCA 60% >> FFR 0.69  >> PCI: 3 x 15 mm Resolute DES  . Depression   . Esophageal reflux   . Family history of adverse reaction to anesthesia    "daughter has PONV"  . H/O blood clots    "get them in my stool and urine" (11/12/2015)  . Heart murmur   . Hx of radiation therapy 04/14/13-06/09/13   prostate 7800 cGy, 40 sessions, seminal vesicles 5600 cGy 40 sessions  . Hyperlipidemia   . Malignant melanoma in situ ([place]) 09/04/2016   Right neck and chest  . Prostate cancer ([place]) dx'd 2014  . Radiation proctitis     Past Surgical History:  Procedure Laterality Date  . CARDIAC CATHETERIZATION N/A 04/21/2015   Procedure: Right/Left Heart Cath and Coronary Angiography;  Surgeon: [name], MD;  Location: [facility];  Service: Cardiovascular;  Laterality: N/A;  . CARDIAC CATHETERIZATION N/A  06/18/2015   Procedure: Intravascular Pressure Wire/FFR Study;  Surgeon: [name], MD;  Location: [facility];  Service: Cardiovascular;  Laterality: N/A;  . CARDIAC CATHETERIZATION N/A 06/18/2015   Procedure: Coronary Stent Intervention;  Surgeon: [name], MD;  Location: [facility];  Service: Cardiovascular;  Laterality: N/A;  . CARDIAC CATHETERIZATION N/A 06/18/2015   Procedure: Right/Left Heart Cath and Coronary Angiography;  Surgeon: [name], MD;  Location: [facility];  Service: Cardiovascular;  Laterality: N/A;  . CARDIAC CATHETERIZATION N/A 06/23/2015   Procedure: Left Heart Cath and Cors/Grafts Angiography;  Surgeon: [name], MD;  Location: [facility];  Service: Cardiovascular;  Laterality: N/A;  . CARDIAC CATHETERIZATION N/A 01/17/2016   Procedure: Right/Left Heart Cath and Coronary Angiography;  Surgeon: [name], MD;  Location: [facility];  Service: Cardiovascular;  Laterality: N/A;  . COLONOSCOPY    . FRACTURE SURGERY    . INGUINAL HERNIA REPAIR    . LAPAROSCOPIC APPENDECTOMY N/A 11/16/2015   Procedure: APPENDECTOMY LAPAROSCOPIC;  Surgeon: [name], MD;  Location: [facility];  Service: General;  Laterality: N/A;  . LEFT HEART CATH AND CORONARY ANGIOGRAPHY N/A 12/26/2016   Procedure: Left Heart Cath and Coronary  Angiography;  Surgeon: Troy Sine, MD;  Location: [facility];  Service: Cardiovascular;  Laterality: N/A;  . MELANOMA EXCISION Right 10/24/2016   Procedure: WIDE EXCISION MELANOMA RIGHT NECK AND RIGHT CHEST TIMES 2;  Surgeon: [name], MD;  Location: [facility];  Service: General;  Laterality: Right;  right medial and lateral lesion of chest and right neck  . NASAL FRACTURE SURGERY     "broken years ago; several ORs to correct it"  . NASAL SEPTUM SURGERY    . PROSTATE BIOPSY  2014   "needle biopsy"  . TEE WITHOUT CARDIOVERSION N/A 02/15/2016   Procedure: TRANSESOPHAGEAL ECHOCARDIOGRAM  (TEE);  Surgeon: [name], MD;  Location: [facility];  Service: Open Heart Surgery;  Laterality: N/A;  . TONSILLECTOMY    . TRANSCATHETER AORTIC VALVE REPLACEMENT, TRANSFEMORAL  02/15/2016  . TRANSCATHETER AORTIC VALVE REPLACEMENT, TRANSFEMORAL N/A 02/15/2016   Procedure: TRANSCATHETER AORTIC VALVE REPLACEMENT, TRANSFEMORAL;  Surgeon: [name], MD;  Location: [facility];  Service: Open Heart Surgery;  Laterality: N/A;    There were no vitals filed for this visit.  Subjective Assessment - 01/28/18 0935    Subjective  Not feeling great today. Anniversay of his sister's death. Did his exercises yesterday!    Pertinent History  CAD, aortic stenosis, depression, prostate cancer, melanoma    Patient Stated Goals  to not fall; feel steady on his feet    Currently in Pain?  No/denies                      [facility] Adult PT Treatment/Exercise - 01/28/18 1012      Ambulation/Gait   Ambulation/Gait Assistance  6: Modified independent (Device/Increase time)    Ambulation/Gait Assistance Details  with cane; much better coordination    Ambulation Distance (Feet)  800 Feet 100    Assistive device  Straight cane;None    Gait Pattern  Step-through pattern;Wide base of support    Ambulation Surface  Level;Indoor    Gait velocity  fast 32.8/8.47=3.87 ft/sec    Curb  6: Modified independent (Device/increase time) with cane         Ramp  X 3 reps with supervision and vc for technique progressing to modified independent     Balance Exercises - 01/28/18 1017      Balance Exercises: Standing   Standing Eyes Opened  Narrow base of support (BOS);Head turns;Foam/compliant surface blue mat on ramp up/down    Standing Eyes Closed  Narrow base of support (BOS);Head turns;Wide (BOA);Foam/compliant surface on blue mat  on ramp up/down    Tandem Stance  Eyes open;Foam/compliant surface;Intermittent upper extremity support hold and then step off/on front foot, back foot    Rockerboard   Anterior/posterior;Lateral;Head turns;EO;EC;Intermittent UE support and practice moving board with ankles vs hips    Gait with Head Turns  Forward hall 80 ft diagonal head turns both direcions        PT Education - 01/28/18 1853    Education Details  educated on community exercise opportunities at [facility] (pt planned to go by after today's session to see the facility)    Person(s) Educated  Patient    Methods  Explanation;Handout    Comprehension  Verbalized understanding          PT Long Term Goals - 01/04/18 1920      PT LONG TERM GOAL #1   Title  Patient will be independent with HEP for vertigo and balance deficits. (Target  for LTGs 12/13/17) (TARGET for unmet or new LTGs 01/06/18)    Status  Achieved      PT LONG TERM GOAL #2   Title  Patient will have no vertigo with positional testing.     Status  Achieved      PT LONG TERM GOAL #3   Title  Patient will self-report at least 50% improvement in overall balance.     Baseline  12/07/17 pt reports 25% improvement; 01/04/18 pt continues to feel balance 25% improved    Status  Not Met      PT LONG TERM GOAL #4   Title  Patient will complete DHI and set goal for LTG as appropriate.    Baseline  DHI given to pt on 12-17-17 to complete; 12/21/17 DHI 20% (mild impairment)    Status  Achieved      PT LONG TERM GOAL #5   Title  Patient's DHI score will decrease to <=16% (below lowest threshold for mild impairment). Target 01/06/18    Baseline  01/04/18  16%    Status  Achieved      Additional Long Term Goals   Additional Long Term Goals  Yes      PT LONG TERM GOAL #6   Title  Patient will be independent with updated HEP to address balance deficits and potential vestibular hypofunction. (Target 02/03/18)    Time  4    Period  Weeks    Status  New    Target Date  02/03/18      PT LONG TERM GOAL #7   Title  Patient will improve FGA score to >=26/30 to indicate lesser fall risk and improved balance/gait.     Baseline   01/04/18  21/30    Time  4    Period  Weeks    Status  New      PT LONG TERM GOAL #8   Title  Patient will be able to verbalize a plan for community-based exercise upon discharge from PT.     Time  4    Period  Weeks    Status  New            Plan - 01/28/18 1021    Clinical Impression Statement  Session included balance training on compliant surfaces, ramp, and rockerboard. Patient able to do fairly advanced activities with frequently self-correcting balance without UE support. Gait with balance challenges (head turns, bouncing a ball with either hand) with ability to make adjustments and keep his balance. Patient reports incr confidence when using his cane and that it is becoming more natural. Patient will benefit from further PT to achieve LTGs.     Rehab Potential  Good    Clinical Impairments Affecting Rehab Potential  limited cervical ROM;     PT Frequency  2x / week    PT Duration  4 weeks    PT Treatment/Interventions  ADLs/Self Care Home Management;Canalith Repostioning;Gait training;Functional mobility training;Therapeutic activities;Therapeutic exercise;Balance training;Neuromuscular re-education;Manual techniques;Patient/family education;Passive range of motion;Visual/perceptual remediation/compensation;Vestibular;DME Instruction    PT Next Visit Plan  outside unlevel surfaces with cane; stepping strategies on compliant surfaces (pull and release test difficult for pt to allow COG outside BOS); ?hurdles on red mat, blue mat on ramp,     PT Home Exercise Plan  instructed in ankle pumps and marching in place to assist in decreasing light-headedness with change in position; added standing on foam with EC on 12-04-17; gave sheet with balance on foam (4 positions) for  HEP    Consulted and Agree with Plan of Care  Patient       Patient will benefit from skilled therapeutic intervention in order to improve the following deficits and impairments:  Abnormal gait, Decreased activity  tolerance, Decreased balance, Decreased range of motion, Dizziness, Postural dysfunction, Decreased mobility, Impaired perceived functional ability  Visit Diagnosis: Unsteadiness on feet  Difficulty in walking, not elsewhere classified     Problem List Patient Active Problem List   Diagnosis Date Noted  . Radiation proctitis   . Hx of radiation therapy   . Heart murmur   . H/O blood clots   . Family history of adverse reaction to anesthesia   . CAD (coronary artery disease)   . Aortic stenosis   . Anginal pain ([place])   . History of ITP 02/26/2017  . 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 S/P appendectomy 11/12/2015  . Coronary artery disease   . Esophageal reflux 03/22/2015  . Depression 11/20/2014  . Prostate cancer ([place]) 03/05/2013  . Erectile dysfunction   . Hyperlipidemia 06/07/2007    Rexanne Mano, PT 01/28/2018, 6:54 PM  [facility] [address] [place], [place], [address] Phone: [phone]   Fax:  [phone]  Name: Corey Oneill MRN: [id number] Date of Birth: 02-27-1936

## 2018-01-29 DIAGNOSIS — H2513 Age-related nuclear cataract, bilateral: Secondary | ICD-10-CM | POA: Diagnosis not present

## 2018-01-29 DIAGNOSIS — H0100B Unspecified blepharitis left eye, upper and lower eyelids: Secondary | ICD-10-CM | POA: Diagnosis not present

## 2018-01-29 DIAGNOSIS — H0100A Unspecified blepharitis right eye, upper and lower eyelids: Secondary | ICD-10-CM | POA: Diagnosis not present

## 2018-02-01 ENCOUNTER — Encounter: Payer: Self-pay | Admitting: Physical Therapy

## 2018-02-01 ENCOUNTER — Ambulatory Visit: Payer: Medicare Other | Admitting: Physical Therapy

## 2018-02-01 ENCOUNTER — Telehealth: Payer: Self-pay | Admitting: Family Medicine

## 2018-02-01 DIAGNOSIS — R262 Difficulty in walking, not elsewhere classified: Secondary | ICD-10-CM

## 2018-02-01 DIAGNOSIS — R2681 Unsteadiness on feet: Secondary | ICD-10-CM

## 2018-02-01 DIAGNOSIS — R42 Dizziness and giddiness: Secondary | ICD-10-CM | POA: Diagnosis not present

## 2018-02-01 NOTE — Telephone Encounter (Signed)
Have him try Allegra or Claritin.  He can also see if Flonase will help

## 2018-02-01 NOTE — Telephone Encounter (Signed)
Pt came by and stated he would like a recommendation for an OTC medication to help with nasal drainage, He states he has tried Zyrtec and he had a reaction. Pt can be reached at [phone].

## 2018-02-01 NOTE — Telephone Encounter (Signed)
PT IS AWARE. [place]

## 2018-02-01 NOTE — Therapy (Signed)
[facility] [address] [place], [place], [address] Phone: [phone]   Fax:  [phone]  Physical Therapy Treatment  Patient Details  Name: Corey Oneill MRN: [id number] Date of Birth: 15-Mar-1936 Referring Provider: [name] MD   Encounter Date: 02/01/2018  PT End of Session - 02/01/18 1256    Visit Number  17    Number of Visits  21    Date for PT Re-Evaluation  03/05/18    Authorization Type  MCR; AARP    Authorization Time Period  11/28/17 to 01/27/2018; 12/07/17 to 02/05/18; 01/04/18 to 03/05/18    PT Start Time  0845    PT Stop Time  0928    PT Time Calculation (min)  43 min    Equipment Utilized During Treatment  Gait belt    Activity Tolerance  Patient tolerated treatment well    Behavior During Therapy  [facility] for tasks assessed/performed       Past Medical History:  Diagnosis Date  . Anginal pain ([place])   . Aortic stenosis    a. peak to peak gradient by LHC 8/16:  37 mmHg (moderately severe)   . CAD (coronary artery disease)    a.  LHC 8/16: Mid to distal LAD 30%, OM1 40%, proximal mid RCA 40%, distal RCA 60% >> FFR 0.69  >> PCI: 3 x 15 mm Resolute DES  . Depression   . Esophageal reflux   . Family history of adverse reaction to anesthesia    "daughter has PONV"  . H/O blood clots    "get them in my stool and urine" (11/12/2015)  . Heart murmur   . Hx of radiation therapy 04/14/13-06/09/13   prostate 7800 cGy, 40 sessions, seminal vesicles 5600 cGy 40 sessions  . Hyperlipidemia   . 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 Prostate cancer ([place]) dx'd 2014  . Radiation proctitis     Past Surgical History:  Procedure Laterality Date  . CARDIAC CATHETERIZATION N/A 04/21/2015   Procedure: Right/Left Heart Cath and Coronary Angiography;  Surgeon: [name], MD;  Location: [place] [facility];  Service: Cardiovascular;  Laterality: N/A;  . CARDIAC CATHETERIZATION N/A  06/18/2015   Procedure: Intravascular Pressure Wire/FFR Study;  Surgeon: [name], MD;  Location: [place] [facility];  Service: Cardiovascular;  Laterality: N/A;  . CARDIAC CATHETERIZATION N/A 06/18/2015   Procedure: Coronary Stent Intervention;  Surgeon: [name], MD;  Location: [place] [facility];  Service: Cardiovascular;  Laterality: N/A;  . CARDIAC CATHETERIZATION N/A 06/18/2015   Procedure: Right/Left Heart Cath and Coronary Angiography;  Surgeon: [name], MD;  Location: [place] [facility];  Service: Cardiovascular;  Laterality: N/A;  . CARDIAC CATHETERIZATION N/A 06/23/2015   Procedure: Left Heart Cath and Cors/Grafts Angiography;  Surgeon: [name], MD;  Location: [place] [facility];  Service: Cardiovascular;  Laterality: N/A;  . CARDIAC CATHETERIZATION N/A 01/17/2016   Procedure: Right/Left Heart Cath and Coronary Angiography;  Surgeon: [name], MD;  Location: [place] [facility];  Service: Cardiovascular;  Laterality: N/A;  . COLONOSCOPY    . FRACTURE SURGERY    . INGUINAL HERNIA REPAIR    . LAPAROSCOPIC APPENDECTOMY N/A 11/16/2015   Procedure: APPENDECTOMY LAPAROSCOPIC;  Surgeon: [name], MD;  Location: [facility];  Service: General;  Laterality: N/A;  . LEFT HEART CATH AND CORONARY ANGIOGRAPHY N/A 12/26/2016   Procedure: Left Heart Cath and Coronary  Angiography;  Surgeon: Troy Sine, MD;  Location: [place] [facility];  Service: Cardiovascular;  Laterality: N/A;  . MELANOMA EXCISION Right 10/24/2016   Procedure: WIDE EXCISION MELANOMA RIGHT NECK AND RIGHT CHEST TIMES 2;  Surgeon: [name], MD;  Location: [place];  Service: General;  Laterality: Right;  right medial and lateral lesion of chest and right neck  . NASAL FRACTURE SURGERY     "broken years ago; several ORs to correct it"  . NASAL SEPTUM SURGERY    . PROSTATE BIOPSY  2014   "needle biopsy"  . TEE WITHOUT CARDIOVERSION N/A 02/15/2016   Procedure: TRANSESOPHAGEAL ECHOCARDIOGRAM  (TEE);  Surgeon: [name], MD;  Location: [facility];  Service: Open Heart Surgery;  Laterality: N/A;  . TONSILLECTOMY    . TRANSCATHETER AORTIC VALVE REPLACEMENT, TRANSFEMORAL  02/15/2016  . TRANSCATHETER AORTIC VALVE REPLACEMENT, TRANSFEMORAL N/A 02/15/2016   Procedure: TRANSCATHETER AORTIC VALVE REPLACEMENT, TRANSFEMORAL;  Surgeon: [name], MD;  Location: [facility];  Service: Open Heart Surgery;  Laterality: N/A;    There were no vitals filed for this visit.  Subjective Assessment - 02/01/18 0848    Subjective  No falls, but has caught his foot/leg on the cane a couple of times. He's getting more practice and doing better. Wife bought him a quad tip for cane and he loves it.    Pertinent History  CAD, aortic stenosis, depression, prostate cancer, melanoma    Patient Stated Goals  to not fall; feel steady on his feet    Currently in Pain?  No/denies                No data recorded       [facility] Adult PT Treatment/Exercise - 02/01/18 1254      Ambulation/Gait   Ambulation/Gait Assistance  6: Modified independent (Device/Increase time)    Ambulation/Gait Assistance Details  with cognitive and dual-task challenges; card sorting, scarf tossing    Ambulation Distance (Feet)  800 Feet    Assistive device  Straight cane;None    Gait Pattern  Step-through pattern;Wide base of support    Ambulation Surface  Level;Indoor          Balance Exercises - 02/01/18 0928      Balance Exercises: Standing   Standing Eyes Opened  Narrow base of support (BOS);Foam/compliant surface tossing ball     Standing Eyes Closed  Narrow base of support (BOS);Wide (BOA);Head turns;Foam/compliant surface;Solid surface;Time together, partial tandem    SLS with Vectors  Foam/compliant surface;Upper extremity assist 1;Intermittent upper extremity assist variations with cone taps    Balance Beam  red/grey beam feet together EO, EC; step off/on    Step Over Hurdles / Cones  red mat; 6" hurdles  various directons             PT Long Term Goals - 01/04/18 1920      PT LONG TERM GOAL #1   Title  Patient will be independent with HEP for vertigo and balance deficits. (Target for LTGs 12/13/17) (TARGET for unmet or new LTGs 01/06/18)    Status  Achieved      PT LONG TERM GOAL #2   Title  Patient will have no vertigo with positional testing.     Status  Achieved      PT LONG TERM GOAL #3   Title  Patient will self-report at least 50% improvement in overall balance.     Baseline  12/07/17 pt reports 25% improvement; 01/04/18 pt  continues to feel balance 25% improved    Status  Not Met      PT LONG TERM GOAL #4   Title  Patient will complete DHI and set goal for LTG as appropriate.    Baseline  DHI given to pt on 12-17-17 to complete; 12/21/17 DHI 20% (mild impairment)    Status  Achieved      PT LONG TERM GOAL #5   Title  Patient's DHI score will decrease to <=16% (below lowest threshold for mild impairment). Target 01/06/18    Baseline  01/04/18  16%    Status  Achieved      Additional Long Term Goals   Additional Long Term Goals  Yes      PT LONG TERM GOAL #6   Title  Patient will be independent with updated HEP to address balance deficits and potential vestibular hypofunction. (Target 02/03/18)    Time  4    Period  Weeks    Status  New    Target Date  02/03/18      PT LONG TERM GOAL #7   Title  Patient will improve FGA score to >=26/30 to indicate lesser fall risk and improved balance/gait.     Baseline  01/04/18  21/30    Time  4    Period  Weeks    Status  New      PT LONG TERM GOAL #8   Title  Patient will be able to verbalize a plan for community-based exercise upon discharge from PT.     Time  4    Period  Weeks    Status  New            Plan - 02/01/18 1257    Clinical Impression Statement  Session included balance training on compliant surfaces and gait training with dual-task challenges. Patient with no imbalance but did demonstrate slowing of gait  and stopped several times with cognitive challenge. Again reinforced importance of HEP. Next visit will check LTGs and plan for discharge.    Rehab Potential  Good    Clinical Impairments Affecting Rehab Potential  limited cervical ROM;     PT Frequency  2x / week    PT Duration  4 weeks    PT Treatment/Interventions  ADLs/Self Care Home Management;Canalith Repostioning;Gait training;Functional mobility training;Therapeutic activities;Therapeutic exercise;Balance training;Neuromuscular re-education;Manual techniques;Patient/family education;Passive range of motion;Visual/perceptual remediation/compensation;Vestibular;DME Instruction    PT Next Visit Plan  check LTGs and discharge    PT Home Exercise Plan  instructed in ankle pumps and marching in place to assist in decreasing light-headedness with change in position; added standing on foam with EC on 12-04-17; gave sheet with balance on foam (4 positions) for HEP    Consulted and Agree with Plan of Care  Patient       Patient will benefit from skilled therapeutic intervention in order to improve the following deficits and impairments:  Abnormal gait, Decreased activity tolerance, Decreased balance, Decreased range of motion, Dizziness, Postural dysfunction, Decreased mobility, Impaired perceived functional ability  Visit Diagnosis: Unsteadiness on feet  Difficulty in walking, not elsewhere classified     Problem List Patient Active Problem List   Diagnosis Date Noted  . Radiation proctitis   . Hx of radiation therapy   . Heart murmur   . H/O blood clots   . Family history of adverse reaction to anesthesia   . CAD (coronary artery disease)   . Aortic stenosis   . Anginal pain ([place])   .  History of ITP 02/26/2017  . 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 Malignant melanoma in situ ([place]) 09/04/2016  . 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 S/P appendectomy 11/12/2015  . Coronary artery disease   .  Esophageal reflux 03/22/2015  . Depression 11/20/2014  . Prostate cancer ([place]) 03/05/2013  . Erectile dysfunction   . Hyperlipidemia 06/07/2007    Rexanne Mano, PT 02/01/2018, 1:01 PM  [facility] [address] [place], [place], [address] Phone: [phone]   Fax:  [phone]  Name: Corey Oneill MRN: [id number] Date of Birth: Dec 16, 1935

## 2018-02-04 ENCOUNTER — Ambulatory Visit: Payer: Medicare Other | Admitting: Physical Therapy

## 2018-02-06 DIAGNOSIS — R351 Nocturia: Secondary | ICD-10-CM | POA: Diagnosis not present

## 2018-02-06 DIAGNOSIS — C61 Malignant neoplasm of prostate: Secondary | ICD-10-CM | POA: Diagnosis not present

## 2018-02-08 ENCOUNTER — Ambulatory Visit: Payer: Medicare Other | Admitting: Physical Therapy

## 2018-02-08 ENCOUNTER — Encounter: Payer: Self-pay | Admitting: Physical Therapy

## 2018-02-08 DIAGNOSIS — R2681 Unsteadiness on feet: Secondary | ICD-10-CM | POA: Diagnosis not present

## 2018-02-08 DIAGNOSIS — R42 Dizziness and giddiness: Secondary | ICD-10-CM | POA: Diagnosis not present

## 2018-02-08 DIAGNOSIS — R262 Difficulty in walking, not elsewhere classified: Secondary | ICD-10-CM | POA: Diagnosis not present

## 2018-02-08 NOTE — Therapy (Signed)
[facility] [address] [place], [place], [address] Phone: [phone]   Fax:  [phone]  Physical Therapy Treatment and Discharge Summary  Patient Details  Name: Corey Oneill MRN: [id number] Date of Birth: Oct 17, 1936 Referring Provider: [name] MD   Encounter Date: 02/08/2018  PT End of Session - 02/08/18 0923    Visit Number  18    Number of Visits  21    Date for PT Re-Evaluation  03/05/18    Authorization Type  MCR; AARP    Authorization Time Period  11/28/17 to 01/27/2018; 12/07/17 to 02/05/18; 01/04/18 to 03/05/18    PT Start Time  0853 arrived late; d/c appt and full time not needed    PT Stop Time  0923    PT Time Calculation (min)  30 min    Equipment Utilized During Treatment  --    Activity Tolerance  Patient tolerated treatment well    Behavior During Therapy  [facility] for tasks assessed/performed       Past Medical History:  Diagnosis Date  . Anginal pain ([place])   . Aortic stenosis    a. peak to peak gradient by LHC 8/16:  37 mmHg (moderately severe)   . CAD (coronary artery disease)    a.  LHC 8/16: Mid to distal LAD 30%, OM1 40%, proximal mid RCA 40%, distal RCA 60% >> FFR 0.69  >> PCI: 3 x 15 mm Resolute DES  . Depression   . Esophageal reflux   . Family history of adverse reaction to anesthesia    "daughter has PONV"  . H/O blood clots    "get them in my stool and urine" (11/12/2015)  . Heart murmur   . Hx of radiation therapy 04/14/13-06/09/13   prostate 7800 cGy, 40 sessions, seminal vesicles 5600 cGy 40 sessions  . Hyperlipidemia   . Malignant melanoma in situ ([place]) 09/04/2016   Right neck and chest  . Prostate cancer ([place]) dx'd 2014  . Radiation proctitis     Past Surgical History:  Procedure Laterality Date  . CARDIAC CATHETERIZATION N/A 04/21/2015   Procedure: Right/Left Heart Cath and Coronary Angiography;  Surgeon: [name], MD;  Location: [place] [facility];  Service:  Cardiovascular;  Laterality: N/A;  . CARDIAC CATHETERIZATION N/A 06/18/2015   Procedure: Intravascular Pressure Wire/FFR Study;  Surgeon: [name], MD;  Location: [employer] [facility];  Service: Cardiovascular;  Laterality: N/A;  . CARDIAC CATHETERIZATION N/A 06/18/2015   Procedure: Coronary Stent Intervention;  Surgeon: [name], MD;  Location: [place] [facility];  Service: Cardiovascular;  Laterality: N/A;  . CARDIAC CATHETERIZATION N/A 06/18/2015   Procedure: Right/Left Heart Cath and Coronary Angiography;  Surgeon: [name], MD;  Location: [place] [facility];  Service: Cardiovascular;  Laterality: N/A;  . CARDIAC CATHETERIZATION N/A 06/23/2015   Procedure: Left Heart Cath and Cors/Grafts Angiography;  Surgeon: [name], MD;  Location: [place] [facility];  Service: Cardiovascular;  Laterality: N/A;  . CARDIAC CATHETERIZATION N/A 01/17/2016   Procedure: Right/Left Heart Cath and Coronary Angiography;  Surgeon: [name], MD;  Location: [place] [facility];  Service: Cardiovascular;  Laterality: N/A;  . COLONOSCOPY    . FRACTURE SURGERY    . INGUINAL HERNIA REPAIR    . LAPAROSCOPIC APPENDECTOMY N/A 11/16/2015   Procedure: APPENDECTOMY LAPAROSCOPIC;  Surgeon: [name], MD;  Location: [place];  Service: General;  Laterality: N/A;  . LEFT HEART CATH AND CORONARY  ANGIOGRAPHY N/A 12/26/2016   Procedure: Left Heart Cath and Coronary Angiography;  Surgeon: Troy Sine, MD;  Location: [place] [facility];  Service: Cardiovascular;  Laterality: N/A;  . MELANOMA EXCISION Right 10/24/2016   Procedure: WIDE EXCISION MELANOMA RIGHT NECK AND RIGHT CHEST TIMES 2;  Surgeon: [name], MD;  Location: [place];  Service: General;  Laterality: Right;  right medial and lateral lesion of chest and right neck  . NASAL FRACTURE SURGERY     "broken years ago; several ORs to correct it"  . NASAL SEPTUM SURGERY    . PROSTATE BIOPSY  2014   "needle biopsy"  . TEE WITHOUT  CARDIOVERSION N/A 02/15/2016   Procedure: TRANSESOPHAGEAL ECHOCARDIOGRAM (TEE);  Surgeon: [name], MD;  Location: [place];  Service: Open Heart Surgery;  Laterality: N/A;  . TONSILLECTOMY    . TRANSCATHETER AORTIC VALVE REPLACEMENT, TRANSFEMORAL  02/15/2016  . TRANSCATHETER AORTIC VALVE REPLACEMENT, TRANSFEMORAL N/A 02/15/2016   Procedure: TRANSCATHETER AORTIC VALVE REPLACEMENT, TRANSFEMORAL;  Surgeon: [name], MD;  Location: [place];  Service: Open Heart Surgery;  Laterality: N/A;    There were no vitals filed for this visit.  Subjective Assessment - 02/08/18 0854    Subjective  Doesn't feel safe walking the dog with the cane. Asking if it's really necessary to use the cane.     Pertinent History  CAD, aortic stenosis, depression, prostate cancer, melanoma    Patient Stated Goals  to not fall; feel steady on his feet    Currently in Pain?  No/denies         [facility] PT Assessment - 02/08/18 0903      Standardized Balance Assessment   Standardized Balance Assessment  Berg Balance Test      Berg Balance Test   Sit to Stand  Able to stand without using hands and stabilize independently    Standing Unsupported  Able to stand safely 2 minutes    Sitting with Back Unsupported but Feet Supported on Floor or Stool  Able to sit safely and securely 2 minutes    Stand to Sit  Sits safely with minimal use of hands    Transfers  Able to transfer safely, minor use of hands    Standing Unsupported with Eyes Closed  Able to stand 10 seconds safely    Standing Ubsupported with Feet Together  Able to place feet together independently and stand 1 minute safely    From Standing, Reach Forward with Outstretched Arm  Can reach confidently >25 cm (10")    From Standing Position, Pick up Object from Floor  Able to pick up shoe safely and easily    From Standing Position, Turn to Look Behind Over each Shoulder  Looks behind from both sides and weight shifts well    Turn 360 Degrees  Able to turn 360  degrees safely but slowly    Standing Unsupported, Alternately Place Feet on Step/Stool  Able to stand independently and safely and complete 8 steps in 20 seconds    Standing Unsupported, One Foot in Front  Able to plae foot ahead of the other independently and hold 30 seconds    Standing on One Leg  Unable to try or needs assist to prevent fall    Total Score  49      Functional Gait  Assessment   Gait Level Surface  Walks 20 ft in less than 5.5 sec, no assistive devices, good speed, no evidence for imbalance, normal gait pattern,  deviates no more than 6 in outside of the 12 in walkway width. 5.4    Change in Gait Speed  Able to smoothly change walking speed without loss of balance or gait deviation. Deviate no more than 6 in outside of the 12 in walkway width.    Gait with Horizontal Head Turns  Performs head turns smoothly with slight change in gait velocity (eg, minor disruption to smooth gait path), deviates 6-10 in outside 12 in walkway width, or uses an assistive device.    Gait with Vertical Head Turns  Performs head turns with no change in gait. Deviates no more than 6 in outside 12 in walkway width.    Gait and Pivot Turn  Pivot turns safely within 3 sec and stops quickly with no loss of balance.    Step Over Obstacle  Is able to step over 2 stacked shoe boxes taped together (9 in total height) without changing gait speed. No evidence of imbalance.    Gait with Narrow Base of Support  Ambulates 4-7 steps.    Gait with Eyes Closed  Walks 20 ft, slow speed, abnormal gait pattern, evidence for imbalance, deviates 10-15 in outside 12 in walkway width. Requires more than 9 sec to ambulate 20 ft.    Ambulating Backwards  Walks 20 ft, uses assistive device, slower speed, mild gait deviations, deviates 6-10 in outside 12 in walkway width.    Steps  Alternating feet, must use rail.    Total Score  23                           PT Education - 02/08/18 7793    Education  Details  results of assessments; Merrilee Jansky indicates need for cane indoors AND outdoors    Person(s) Educated  Patient    Methods  Explanation    Comprehension  Verbalized understanding          PT Long Term Goals - 02/08/18 0925      PT LONG TERM GOAL #6   Title  Patient will be independent with updated HEP to address balance deficits and potential vestibular hypofunction. (Target 02/03/18)    Baseline  3/29  Pt reports he knows how to do the exercises, he just doesn't make time to do them    Time  4    Period  Weeks    Status  Partially Met      PT LONG TERM GOAL #7   Title  Patient will improve FGA score to >=26/30 to indicate lesser fall risk and improved balance/gait.     Baseline  01/04/18  21/30  02/09/15 23/30    Time  4    Period  Weeks    Status  Partially Met      PT LONG TERM GOAL #8   Title  Patient will be able to verbalize a plan for community-based exercise upon discharge from PT.     Baseline  02/08/18 Reports he has gone to [facility] and "checked it out." Not sure if he will go to exercise class or perhaps just use the gym    Time  4    Period  Weeks    Status  Achieved            Plan - 02/08/18 0929    Clinical Impression Statement  Patient seen for assessment of LTGs and met 1 of 3 goals and partially met 2 of 3 (improved but  not to goal level). Patient reports he has done poorly with compliance to HEP and is very appreciative for what he has learned. Ultimately he expressed understanding of recommendation to use cane. Patient is discharged from PT.     Rehab Potential  Good    Clinical Impairments Affecting Rehab Potential  limited cervical ROM;     PT Frequency  2x / week    PT Duration  4 weeks    PT Treatment/Interventions  ADLs/Self Care Home Management;Canalith Repostioning;Gait training;Functional mobility training;Therapeutic activities;Therapeutic exercise;Balance training;Neuromuscular re-education;Manual techniques;Patient/family  education;Passive range of motion;Visual/perceptual remediation/compensation;Vestibular;DME Instruction    PT Next Visit Plan  --    PT Home Exercise Plan  --    Consulted and Agree with Plan of Care  Patient       Patient will benefit from skilled therapeutic intervention in order to improve the following deficits and impairments:  Abnormal gait, Decreased activity tolerance, Decreased balance, Decreased range of motion, Dizziness, Postural dysfunction, Decreased mobility, Impaired perceived functional ability  Visit Diagnosis: Unsteadiness on feet  Difficulty in walking, not elsewhere classified     Problem List Patient Active Problem List   Diagnosis Date Noted  . Radiation proctitis   . Hx of radiation therapy   . Heart murmur   . H/O blood clots   . Family history of adverse reaction to anesthesia   . CAD (coronary artery disease)   . Aortic stenosis   . Anginal pain ([place])   . History of ITP 02/26/2017  . Malignant melanoma of neck ([place]) 10/02/2016  . 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 S/P appendectomy 11/12/2015  . Coronary artery disease   . Esophageal reflux 03/22/2015  . Depression 11/20/2014  . Prostate cancer ([place]) 03/05/2013  . Erectile dysfunction   . Hyperlipidemia 06/07/2007    Jeanie Cooks Destanee Bedonie. PT 02/08/2018, 7:10 PM  [facility] [address] [place], [place], [address] Phone: [phone]   Fax:  [phone]  Name: Corey Oneill MRN: [id number] Date of Birth: February 03, 1936

## 2018-02-12 ENCOUNTER — Ambulatory Visit (INDEPENDENT_AMBULATORY_CARE_PROVIDER_SITE_OTHER): Payer: Medicare Other | Admitting: Family Medicine

## 2018-02-12 VITALS — BP 110/68 | HR 81 | Wt 156.6 lb

## 2018-02-12 DIAGNOSIS — J069 Acute upper respiratory infection, unspecified: Secondary | ICD-10-CM | POA: Diagnosis not present

## 2018-02-12 DIAGNOSIS — R42 Dizziness and giddiness: Secondary | ICD-10-CM

## 2018-02-12 DIAGNOSIS — H259 Unspecified age-related cataract: Secondary | ICD-10-CM | POA: Diagnosis not present

## 2018-02-12 DIAGNOSIS — S0031XS Abrasion of nose, sequela: Secondary | ICD-10-CM | POA: Diagnosis not present

## 2018-02-12 NOTE — Progress Notes (Signed)
   Subjective:    Patient ID: Corey Oneill, male    DOB: 07/07/1936, 82 y.o.   MRN: [id number]  HPI He complains of a 3-day history of started with cough, hoarse voice and worsening dizziness.  He has seen neurology in the past concerning his dizziness and was given some exercises however he has not started doing the exercises.  No fever, chills, sore throat or earache.  He apparently does have a right cataract and is scheduled for surgery on Thursday.   Review of Systems     Objective:   Physical Exam Alert and in no distress. Tympanic membranes and canals are normal. Pharyngeal area is normal. Neck is supple without adenopathy or thyromegaly. Cardiac exam shows a regular sinus rhythm without murmurs or gallops. Lungs are clear to auscultation.  2 abrasions noted to the bridge of the nose.        Assessment & Plan:  Viral upper respiratory tract infection  Abrasion of nose, sequela  Dizziness  Senile cataract of right eye, unspecified age-related cataract type He states that 1 of the abrasions is from using a Breathe Right strip and the other one is residual from a fall that he had back in January. Recommend supportive care for the upper respiratory infection.  Also recommend he get involved with doing the exercises for the dizziness.

## 2018-02-14 DIAGNOSIS — H25811 Combined forms of age-related cataract, right eye: Secondary | ICD-10-CM | POA: Diagnosis not present

## 2018-02-14 DIAGNOSIS — H2511 Age-related nuclear cataract, right eye: Secondary | ICD-10-CM | POA: Diagnosis not present

## 2018-02-14 DIAGNOSIS — H25011 Cortical age-related cataract, right eye: Secondary | ICD-10-CM | POA: Diagnosis not present

## 2018-04-01 ENCOUNTER — Other Ambulatory Visit: Payer: Self-pay | Admitting: Family Medicine

## 2018-04-11 DIAGNOSIS — H25812 Combined forms of age-related cataract, left eye: Secondary | ICD-10-CM | POA: Diagnosis not present

## 2018-04-11 DIAGNOSIS — H2512 Age-related nuclear cataract, left eye: Secondary | ICD-10-CM | POA: Diagnosis not present

## 2018-04-12 DIAGNOSIS — D1801 Hemangioma of skin and subcutaneous tissue: Secondary | ICD-10-CM | POA: Diagnosis not present

## 2018-04-12 DIAGNOSIS — Z85820 Personal history of malignant melanoma of skin: Secondary | ICD-10-CM | POA: Diagnosis not present

## 2018-04-12 DIAGNOSIS — L821 Other seborrheic keratosis: Secondary | ICD-10-CM | POA: Diagnosis not present

## 2018-04-12 DIAGNOSIS — L309 Dermatitis, unspecified: Secondary | ICD-10-CM | POA: Diagnosis not present

## 2018-04-12 DIAGNOSIS — L905 Scar conditions and fibrosis of skin: Secondary | ICD-10-CM | POA: Diagnosis not present

## 2018-04-12 DIAGNOSIS — D229 Melanocytic nevi, unspecified: Secondary | ICD-10-CM | POA: Diagnosis not present

## 2018-04-12 DIAGNOSIS — L814 Other melanin hyperpigmentation: Secondary | ICD-10-CM | POA: Diagnosis not present

## 2018-04-12 DIAGNOSIS — D485 Neoplasm of uncertain behavior of skin: Secondary | ICD-10-CM | POA: Diagnosis not present

## 2018-04-12 DIAGNOSIS — L570 Actinic keratosis: Secondary | ICD-10-CM | POA: Diagnosis not present

## 2018-04-23 ENCOUNTER — Telehealth: Payer: Self-pay | Admitting: Family Medicine

## 2018-04-23 NOTE — Telephone Encounter (Signed)
Pt is advised and has an appt with Dr. Pearline Cables already [facility]

## 2018-04-23 NOTE — Telephone Encounter (Signed)
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

## 2018-04-23 NOTE — Telephone Encounter (Signed)
Pt would like a recommendation to a Dermatologist other than Dr. Allyson Sabal because he has retired

## 2018-05-03 ENCOUNTER — Telehealth: Payer: Self-pay | Admitting: Family Medicine

## 2018-05-03 NOTE — Telephone Encounter (Signed)
Pt called and Is wanting to know if he needs to continue taking his Cymbalta states he has 3 refills left, please advise pt can be reached at [phone]

## 2018-05-03 NOTE — Telephone Encounter (Signed)
yes

## 2018-05-06 NOTE — Telephone Encounter (Signed)
Pt aware/kh 

## 2018-05-13 DIAGNOSIS — C61 Malignant neoplasm of prostate: Secondary | ICD-10-CM | POA: Diagnosis not present

## 2018-05-20 ENCOUNTER — Encounter: Payer: Self-pay | Admitting: Cardiovascular Disease

## 2018-05-28 ENCOUNTER — Ambulatory Visit
Admission: RE | Admit: 2018-05-28 | Discharge: 2018-05-28 | Disposition: A | Discharge status: Home / Self Care | Payer: Medicare Other | Source: Ambulatory Visit | Attending: Family Medicine | Admitting: Family Medicine

## 2018-05-28 ENCOUNTER — Ambulatory Visit (INDEPENDENT_AMBULATORY_CARE_PROVIDER_SITE_OTHER): Payer: Medicare Other | Admitting: Family Medicine

## 2018-05-28 ENCOUNTER — Encounter: Payer: Self-pay | Admitting: Family Medicine

## 2018-05-28 VITALS — BP 104/58 | HR 72 | Temp 97.4°F | Wt 158.0 lb

## 2018-05-28 DIAGNOSIS — S90129A Contusion of unspecified lesser toe(s) without damage to nail, initial encounter: Secondary | ICD-10-CM

## 2018-05-28 DIAGNOSIS — M79671 Pain in right foot: Secondary | ICD-10-CM

## 2018-05-28 DIAGNOSIS — S90121A Contusion of right lesser toe(s) without damage to nail, initial encounter: Secondary | ICD-10-CM | POA: Diagnosis not present

## 2018-05-28 NOTE — Patient Instructions (Signed)
We will call you with your XR result.   If you develop any new or worsening symptoms, you may need to be seen again.

## 2018-05-28 NOTE — Progress Notes (Signed)
   Subjective:    Patient ID: Corey Oneill, male    DOB: [birth year], 82 y.o.   MRN: [id number]  HPI Chief Complaint  Patient presents with  . foot pain    foot pain- toes are bruised and swollen   He is here with complaints of right foot bruising and tenderness. States last week he had pain to the dorsum of his foot with ambulation. Pain resolved. States he has noticed bruising of the toes on his right foot. No known injury.  Denies fever, chills, numbness, tingling or weakness.   Denies calf or leg pain. No recent surgery or immobilization.   Reviewed allergies, medications, past medical, surgical, family, and social history.    Review of Systems Pertinent positives and negatives in the history of present illness.     Objective:   Physical Exam  Constitutional: He appears well-developed and well-nourished. No distress.  Musculoskeletal:       Right ankle: Normal.       Right foot: There is tenderness. There is normal range of motion, no swelling and normal capillary refill.       Feet:  Dorsum with TTP, no erythema, edema, increased warmth or coolness compared to left foot. Normal sensation (monofilament testing done), capillary refill, pulses, ROM and strength. 2nd, 3rd and 4th toes with bruising, non tender.  Calf is soft, no swelling or tenderness. Negatives [place].    BP (!) 104/58   Pulse 72   Temp (!) 97.4 F (36.3 C) (Oral)   Wt 158 lb (71.7 kg)   SpO2 96%   BMI 22.67 kg/m       Assessment & Plan:  Acute pain of right foot - Plan: DG Foot Complete Right  Bruised toe - Plan: DG Foot Complete Right  Good circulation on exam and no sign of obstruction. Will send him for an XR of the right foot. Pain has resolved but he does have TTP unexplained. Follow up if symptoms worsen and pending XR.

## 2018-05-30 DIAGNOSIS — R310 Gross hematuria: Secondary | ICD-10-CM | POA: Diagnosis not present

## 2018-05-30 DIAGNOSIS — C61 Malignant neoplasm of prostate: Secondary | ICD-10-CM | POA: Diagnosis not present

## 2018-05-31 ENCOUNTER — Telehealth: Payer: Self-pay | Admitting: Family Medicine

## 2018-05-31 NOTE — Telephone Encounter (Signed)
  Patient called to check on xray results Advised patient that Corey Oneill had sent him a note Thru my chart. He didn't understand about  My Chart, so I gave results to him. He advised that he is feeling better but will call back if any more problems

## 2018-06-03 ENCOUNTER — Encounter

## 2018-06-03 ENCOUNTER — Other Ambulatory Visit: Payer: Self-pay | Admitting: Cardiology

## 2018-06-03 ENCOUNTER — Encounter: Payer: Self-pay | Admitting: Cardiovascular Disease

## 2018-06-03 ENCOUNTER — Ambulatory Visit (INDEPENDENT_AMBULATORY_CARE_PROVIDER_SITE_OTHER): Payer: Medicare Other | Admitting: Cardiovascular Disease

## 2018-06-03 VITALS — BP 126/62 | HR 69 | Ht 70.0 in | Wt 159.0 lb

## 2018-06-03 DIAGNOSIS — I2510 Atherosclerotic heart disease of native coronary artery without angina pectoris: Secondary | ICD-10-CM

## 2018-06-03 DIAGNOSIS — I359 Nonrheumatic aortic valve disorder, unspecified: Secondary | ICD-10-CM | POA: Diagnosis not present

## 2018-06-03 NOTE — Progress Notes (Signed)
Cardiology Office Note Date:  06/04/2018   ID:  Corey Oneill, DOB 1936/11/10, MRN [id number]  PCP:  Corey Lung, MD  Cardiologist:  Corey Mocha, MD    Chief Complaint  Patient presents with  . Follow-up    aortic  valve disease     History of Present Illness: Corey Oneill is a 82 y.o. male who presents for follow-up evaluation. The patient has coronary artery disease and aortic valve disease.  He has undergone stenting of the RCA.  He underwent TAVR in 2017 for treatment of severe symptomatic aortic stenosis.   The patient is here alone today.  He reports some recent problems with dizziness.  This primarily occurs when he is lying down.  States that he has to steady himself when he first gets up.  He feels like the room is spinning or as if he is "floating."  No postural syncope.  No chest pain or shortness of breath.  He continues to have intermittent problems with depression.  Past Medical History:  Diagnosis Date  . Anginal pain ([place])   . Aortic stenosis    a. peak to peak gradient by LHC 8/16:  37 mmHg (moderately severe)   . CAD (coronary artery disease)    a.  LHC 8/16: Mid to distal LAD 30%, OM1 40%, proximal mid RCA 40%, distal RCA 60% >> FFR 0.69  >> PCI: 3 x 15 mm Resolute DES  . Depression   . Esophageal reflux   . Family history of adverse reaction to anesthesia    "daughter has PONV"  . H/O blood clots    "get them in my stool and urine" (11/12/2015)  . Heart murmur   . Hx of radiation therapy 04/14/13-06/09/13   prostate 7800 cGy, 40 sessions, seminal vesicles 5600 cGy 40 sessions  . Hyperlipidemia   . Malignant melanoma in situ ([place]) 09/04/2016   Right neck and chest  . Prostate cancer ([place]) dx'd 2014  . Radiation proctitis     Past Surgical History:  Procedure Laterality Date  . CARDIAC CATHETERIZATION N/A 04/21/2015   Procedure: Right/Left Heart Cath and Coronary Angiography;  Surgeon: Corey Mocha, MD;  Location: [facility];  Service:  Cardiovascular;  Laterality: N/A;  . CARDIAC CATHETERIZATION N/A 06/18/2015   Procedure: Intravascular Pressure Wire/FFR Study;  Surgeon: Corey Crome, MD;  Location: [facility];  Service: Cardiovascular;  Laterality: N/A;  . CARDIAC CATHETERIZATION N/A 06/18/2015   Procedure: Coronary Stent Intervention;  Surgeon: Corey Crome, MD;  Location: [facility];  Service: Cardiovascular;  Laterality: N/A;  . CARDIAC CATHETERIZATION N/A 06/18/2015   Procedure: Right/Left Heart Cath and Coronary Angiography;  Surgeon: Corey Crome, MD;  Location: [facility];  Service: Cardiovascular;  Laterality: N/A;  . CARDIAC CATHETERIZATION N/A 06/23/2015   Procedure: Left Heart Cath and Cors/Grafts Angiography;  Surgeon: Corey Crome, MD;  Location: [facility];  Service: Cardiovascular;  Laterality: N/A;  . CARDIAC CATHETERIZATION N/A 01/17/2016   Procedure: Right/Left Heart Cath and Coronary Angiography;  Surgeon: Corey Mocha, MD;  Location: [facility];  Service: Cardiovascular;  Laterality: N/A;  . COLONOSCOPY    . FRACTURE SURGERY    . INGUINAL HERNIA REPAIR    . LAPAROSCOPIC APPENDECTOMY N/A 11/16/2015   Procedure: APPENDECTOMY LAPAROSCOPIC;  Surgeon: Corey Luna, MD;  Location: [facility];  Service: General;  Laterality: N/A;  . LEFT HEART CATH AND CORONARY ANGIOGRAPHY N/A 12/26/2016  Procedure: Left Heart Cath and Coronary Angiography;  Surgeon: Corey Sine, MD;  Location: [facility];  Service: Cardiovascular;  Laterality: N/A;  . MELANOMA EXCISION Right 10/24/2016   Procedure: WIDE EXCISION MELANOMA RIGHT NECK AND RIGHT CHEST TIMES 2;  Surgeon: Corey Luna, MD;  Location: [facility];  Service: General;  Laterality: Right;  right medial and lateral lesion of chest and right neck  . NASAL FRACTURE SURGERY     "broken years ago; several ORs to correct it"  . NASAL SEPTUM SURGERY    . PROSTATE BIOPSY  2014   "needle biopsy"  . TEE WITHOUT  CARDIOVERSION N/A 02/15/2016   Procedure: TRANSESOPHAGEAL ECHOCARDIOGRAM (TEE);  Surgeon: Corey Mocha, MD;  Location: [facility];  Service: Open Heart Surgery;  Laterality: N/A;  . TONSILLECTOMY    . TRANSCATHETER AORTIC VALVE REPLACEMENT, TRANSFEMORAL  02/15/2016  . TRANSCATHETER AORTIC VALVE REPLACEMENT, TRANSFEMORAL N/A 02/15/2016   Procedure: TRANSCATHETER AORTIC VALVE REPLACEMENT, TRANSFEMORAL;  Surgeon: Corey Mocha, MD;  Location: [facility];  Service: Open Heart Surgery;  Laterality: N/A;    Current Outpatient Medications  Medication Sig Dispense Refill  . aspirin EC 81 MG tablet Take 81 mg by mouth 2 (two) times daily.    . DULoxetine (CYMBALTA) 30 MG capsule TAKE (1) CAPSULE DAILY. 30 capsule 5  . isosorbide mononitrate (IMDUR) 30 MG 24 hr tablet Take 15 mg by mouth daily.    [name] MAGNESIUM CITRATE PO Take 2,500 Units by mouth.    . MULTIPLE VITAMIN PO Take 1 tablet by mouth daily.    . nitroGLYCERIN (NITROSTAT) 0.4 MG SL tablet 1 TAB UNDER TONGUE AS NEEDED FOR CHEST PAIN. MAY REPEAT EVERY 5 MIN FOR A TOTAL OF 3 DOSES. 25 tablet 2  . rosuvastatin (CRESTOR) 10 MG tablet TAKE 1 TABLET ONCE DAILY. 90 tablet 0  . carvedilol (COREG) 3.125 MG tablet Take 1 tablet (3.125 mg total) by mouth 2 (two) times daily. 60 tablet 11   No current facility-administered medications for this visit.     Allergies:   Patient has no known allergies.   Social History:  The patient  reports that he has quit smoking. His smoking use included cigarettes. He smoked 0.00 packs per day. He has never used smokeless tobacco. He reports that he drinks alcohol. He reports that he does not use drugs.   Family History:  The patient's family history includes Brain cancer in his father; Depression in his daughter; Lymphoma in his mother.   ROS:  Please see the history of present illness.  Otherwise, review of systems is positive for hearing loss, depression, blood in urine, balance problems.  All other systems are reviewed and  negative.    PHYSICAL EXAM: VS:  BP 126/62   Pulse 69   Ht 5\' 10"  (1.778 m)   Wt 159 lb (72.1 kg)   SpO2 93%   BMI 22.81 kg/m  , BMI Body mass index is 22.81 kg/m. GEN: Well nourished, well developed, in no acute distress  HEENT: normal  Neck: no JVD, no masses. No carotid bruits Cardiac: RRR with 2/6 systolic murmur at the apex, no diastolic murmur Respiratory:  clear to auscultation bilaterally, normal work of breathing GI: soft, nontender, nondistended, + BS MS: no deformity or atrophy  Ext: no pretibial edema, pedal pulses 2+= bilaterally Skin: warm and dry, no rash Neuro:  Strength and sensation are intact Psych: euthymic mood, full affect  EKG:  EKG is not ordered today.  Recent  Labs: 09/21/2017: ALT 16; BUN 18.7; Creatinine 0.9; HGB 12.8; Platelets 57; Potassium 4.4; Sodium 141   Lipid Panel     Component Value Date/Time   CHOL 182 07/05/2017 0957   TRIG 67 07/05/2017 0957   HDL 61 07/05/2017 0957   CHOLHDL 3.0 07/05/2017 0957   VLDL 13 07/05/2017 0957   LDLCALC 108 (H) 07/05/2017 0957      Wt Readings from Last 3 Encounters:  06/04/18 158 lb 12.8 oz (72 kg)  06/03/18 159 lb (72.1 kg)  05/28/18 158 lb (71.7 kg)     Cardiac Studies Reviewed: 2D echocardiogram 02/22/2017: Study Conclusions  - Left ventricle: The cavity size was normal. Wall thickness was   normal. Systolic function was normal. The estimated ejection   fraction was in the range of 60% to 65%. Wall motion was normal;   there were no regional wall motion abnormalities. Doppler   parameters are consistent with abnormal left ventricular   relaxation (grade 1 diastolic dysfunction). The E/e&' ratio is   between 8-15, suggesting indeterminate LV filling pressure. - Aortic valve: S/p bioprosthetic stent valve. No obstruction or   paravalvular leak. Mean gradient (S): 10 mm Hg. Peak gradient   (S): 21 mm Hg. - Mitral valve: Calcified annulus. Mildly thickened leaflets .   There was trivial  regurgitation. - Left atrium: The atrium was normal in size. - Inferior vena cava: The vessel was normal in size. The   respirophasic diameter changes were in the normal range (>= 50%),   consistent with normal central venous pressure.  Impressions:  - Compared to a prior study in 03/2016, the aortic bioprosthesis is   stable and non-obstructed without paravalvular leak. LVEF is   higher at 60-65%.  ASSESSMENT AND PLAN: 1.  CAD, native vessel, with angina: no recent symptoms.  He will continue on low-dose aspirin, carvedilol, and isosorbide.  He is on a statin drug with rosuvastatin.  2.  Aortic valve disease status post TAVR: Normal function of his aortic valve bioprosthesis based on follow-up echo imaging, last one year ago.  He should continue with SBE prophylaxis when indicated.  3.  Hyperlipidemia: He continues on a statin drug.  Lipids are followed by his primary physician.  Current medicines are reviewed with the patient today.  The patient does not have concerns regarding medicines.  Labs/ tests ordered today include:  No orders of the defined types were placed in this encounter.   Disposition:   FU 6 months  Signed, Corey Mocha, MD  06/04/2018 9:28 PM    [address], [place], [place]  [address] Phone: [phone]; Fax: [phone]

## 2018-06-03 NOTE — Patient Instructions (Signed)

## 2018-06-04 ENCOUNTER — Encounter: Payer: Self-pay | Admitting: Family Medicine

## 2018-06-04 ENCOUNTER — Ambulatory Visit (INDEPENDENT_AMBULATORY_CARE_PROVIDER_SITE_OTHER): Payer: Medicare Other | Admitting: Family Medicine

## 2018-06-04 VITALS — BP 122/64 | HR 68 | Temp 97.6°F | Wt 158.8 lb

## 2018-06-04 DIAGNOSIS — H8110 Benign paroxysmal vertigo, unspecified ear: Secondary | ICD-10-CM | POA: Diagnosis not present

## 2018-06-04 DIAGNOSIS — I2510 Atherosclerotic heart disease of native coronary artery without angina pectoris: Secondary | ICD-10-CM | POA: Diagnosis not present

## 2018-06-04 NOTE — Progress Notes (Signed)
   Subjective:    Patient ID: Corey Oneill, male    DOB: 28-Feb-1936, 82 y.o.   MRN: [id number]  HPI He is here for consult concerning difficulty with dizziness.  He is noted over the last several days that if he lies on his left side, he will become dizzy.  He also notes when he looks down he becomes more dizzy.  He has a previous history of BPPV and was in therapy for this but he did not continue with his exercises for that.   Review of Systems     Objective:   Physical Exam Alert and in no distress.  Dizziness does occur when he tips his head downward.  EOMI.  Throat is clear.  Neck is supple without adenopathy.  Cardiac and lungs are normal.       Assessment & Plan:  Benign paroxysmal positional vertigo, unspecified laterality - Plan: Ambulatory referral to Physical Therapy  Referring back to the previous therapists and hopefully this will take care of it.

## 2018-06-05 DIAGNOSIS — N210 Calculus in bladder: Secondary | ICD-10-CM | POA: Diagnosis not present

## 2018-06-05 DIAGNOSIS — R310 Gross hematuria: Secondary | ICD-10-CM | POA: Diagnosis not present

## 2018-06-06 DIAGNOSIS — F4323 Adjustment disorder with mixed anxiety and depressed mood: Secondary | ICD-10-CM | POA: Diagnosis not present

## 2018-06-13 DIAGNOSIS — H04123 Dry eye syndrome of bilateral lacrimal glands: Secondary | ICD-10-CM | POA: Diagnosis not present

## 2018-06-19 ENCOUNTER — Telehealth: Payer: Self-pay

## 2018-06-19 NOTE — Telephone Encounter (Signed)
Spoke to pt he will call back later to get appt info. [place]

## 2018-06-19 NOTE — Telephone Encounter (Signed)
Called pt to inform him of appt. With [facility] 07-09-18 at 8:15 am. Their office is located at [address]. And should he need to reschedule he can reach them at [phone]. For some reason the phone kept getting disconnected. [place]

## 2018-06-19 NOTE — Telephone Encounter (Signed)
Spoke to pt about referral and he say he will try to do exercises that were given to him by pt before the appt and if he feels better he will call and cancel referral  And follow up with Dr. Mammie Russian. If he does not feel better he will keep appt with them . [place]

## 2018-06-25 DIAGNOSIS — C61 Malignant neoplasm of prostate: Secondary | ICD-10-CM | POA: Diagnosis not present

## 2018-06-25 DIAGNOSIS — F4323 Adjustment disorder with mixed anxiety and depressed mood: Secondary | ICD-10-CM | POA: Diagnosis not present

## 2018-06-25 DIAGNOSIS — R310 Gross hematuria: Secondary | ICD-10-CM | POA: Diagnosis not present

## 2018-07-05 DIAGNOSIS — N5201 Erectile dysfunction due to arterial insufficiency: Secondary | ICD-10-CM | POA: Diagnosis not present

## 2018-07-05 DIAGNOSIS — C61 Malignant neoplasm of prostate: Secondary | ICD-10-CM | POA: Diagnosis not present

## 2018-07-05 DIAGNOSIS — R310 Gross hematuria: Secondary | ICD-10-CM | POA: Diagnosis not present

## 2018-07-07 ENCOUNTER — Other Ambulatory Visit: Payer: Self-pay | Admitting: Family Medicine

## 2018-07-09 ENCOUNTER — Ambulatory Visit: Payer: Medicare Other | Attending: Family Medicine

## 2018-07-09 DIAGNOSIS — Z5111 Encounter for antineoplastic chemotherapy: Secondary | ICD-10-CM | POA: Diagnosis not present

## 2018-07-09 DIAGNOSIS — C61 Malignant neoplasm of prostate: Secondary | ICD-10-CM | POA: Diagnosis not present

## 2018-07-11 ENCOUNTER — Other Ambulatory Visit: Payer: Self-pay | Admitting: Urology

## 2018-07-11 ENCOUNTER — Telehealth: Payer: Self-pay | Admitting: Physician Assistant

## 2018-07-11 NOTE — Telephone Encounter (Signed)
New Message      [facility] Pre-operative Risk Assessment    Request for surgical clearance:  1. What type of surgery is being performed? Cystoscopy, bladder biopsy, fulguration, and a cystolithotaxy  2. When is this surgery scheduled? 07/30/18  3. What type of clearance is required (medical clearance vs. Pharmacy clearance to hold med vs. Both)? both  4. Are there any medications that need to be held prior to surgery and how long? Aspirin Hold 5 days prior  5. Practice name and name of physician performing surgery? [facility]/ Dr. [name]  6. What is your office phone number [phone] ext: [phone]   4.   What is your office fax number [phone]  8.   Anesthesia type (None, local, MAC, general) ? General   Corey Oneill 07/11/2018, 4:41 PM  _________________________________________________________________   (provider comments below)

## 2018-07-16 NOTE — Telephone Encounter (Signed)
   I will route this recommendation to the requesting party via Epic fax function and remove from pre-op pool.  Please call with questions.  [place], [place] 07/16/2018, 2:47 PM

## 2018-07-16 NOTE — Telephone Encounter (Signed)
Ok to hold ASA 5 days as requested. thanks

## 2018-07-16 NOTE — Telephone Encounter (Signed)
   Primary Cardiologist: [name], MD  Chart reviewed as part of pre-operative protocol coverage. Patient was contacted 07/16/2018 in reference to pre-operative risk assessment for pending surgery as outlined below.  Corey Oneill was last seen on Dr. Burt Knack by 05/2018.  Since that day, Corey Oneill has done well. Getting > 4 mets of activity per DASI.   Therefore, based on ACC/AHA guidelines, the patient would be at acceptable risk for the planned procedure without further cardiovascular testing.   I will ask Dr. Burt Knack is okay to hold ASA for 5 days? Pease forward your response to [facility] PREOP.   Thank you   Leanor Kail, PA 07/16/2018, 2:11 PM

## 2018-07-22 ENCOUNTER — Encounter (HOSPITAL_BASED_OUTPATIENT_CLINIC_OR_DEPARTMENT_OTHER): Payer: Self-pay

## 2018-07-29 ENCOUNTER — Other Ambulatory Visit: Payer: Self-pay

## 2018-07-29 ENCOUNTER — Encounter (HOSPITAL_BASED_OUTPATIENT_CLINIC_OR_DEPARTMENT_OTHER): Payer: Self-pay

## 2018-07-29 NOTE — Progress Notes (Signed)
Spoke with:  Iona Beard NPO:  After Midnight, no gum, candy, or mints   Arrival time: 0530AM Labs: Istat 4 (EKG 09/21/2017 chart/epic) AM medications: Carvedilol, Duloxetine, Isosorbide Pre op orders: Yes Ride home:  [name] (wife) [phone]

## 2018-07-29 NOTE — H&P (Signed)
Office Visit Report     07/05/2018   --------------------------------------------------------------------------------   Corey Oneill. [place]  MRN: [id number]  PRIMARY CARE:  [name], MD  DOB: [birth year], 82 year old Male  REFERRING:  [name], MD  SSN: **[id number]  PROVIDER:  [name], M.D.    LOCATION:  [facility] [phone]   --------------------------------------------------------------------------------   CC: I have blood in my urine.  HPI: Corey Oneill is a 82 year-old male established patient who is here for blood in the urine.  He did see the blood in his urine. He has seen blood clots.   He does not have a burning sensation when he urinates. He is not currently having trouble urinating.   His last U/S or CT Scan was 06/05/2018.   Goss hematuria in 2016 assoc with XRT necrosis on cysto and CT. F/u CT 2017 showed necrosis improved.   He returns and developed gross hematuria Jul 2019. Red urine - not clots. CT scan was done June 05, 2018. This showed a stone in the bladder close to the left ureteral orifice. It was present on CT in 2017. Hematuria cleared but he noted noc x 3.   He underwent cystoscopy last visit which showed some erythema in the bladder and the stone adhered to the left bladder neck. Also erythematous mucosa. He returns and had recurrent gross hematuria and clots. He racked his urine in small plastic containers and it goes from light red to dark ruby.     CC: I have prostate cancer.  HPI: His prostate cancer was diagnosed 01/22/2013. He does have the pathology report from his biopsy. His cancer was diagnosed by Inter[facility].   He has undergone External Beam Radiation Therapy for treatment.   July 2014-completed IMRT without androgen deprivation.  PSA rapid rise -  -Sep 2015 PSA 0.28  -Aug 2016 PSA 1.33  -Jun 2017 PSA 9.59  -Last Staging - Jul 2019 - not mets, stable mixed lucency lesion of the left iliac bone (negative  on prior bone scan).   Started IADT Jul 2017. PSA 0.57 and rose to 23 Jun 2017. He returned and PSA up to 5.95 with a T of 337 over 6 months after a 4 month Lupron. His AUASS = 9.   Lupron 30 mg given Mar 2019. PSA down to 2.27 May 2018 with a T of 10. PSA now 2.63. His AUASS = 14.     CC: I am having trouble with my erections.  HPI: He first stated noticing pain on approximately 06/13/2008. His symptoms did begin gradually. His symptoms have been stable over the last year.   He does have difficulties achieving an erection. He does have problems maintaining his erections. He has tried Viagra. It did work. He has tried penile injection therapy. He has tried a vacuum erection device. It did not work.   His SHIM is 7.      ALLERGIES: No Allergies    MEDICATIONS: Acetaminophen 500 mg tablet Oral PRN  Aspirin Ec 81 mg tablet, delayed release 0 Oral  Carvedilol 3.125 mg tablet  Duloxetine Hcl 30 mg capsule,delayed release  Isosorbide Mononitrate Er 30 mg tablet, extended release 24 hr 1/2 tablet PO Daily  Magnesium 400 mg magnesium capsule Oral  Nitroglycerin 0.4 MG Sublingual Tablet Sublingual Sublingual  Rosuvastatin Calcium 10 mg tablet Oral     GU PSH: Cystoscopy - 06/25/2018, 2017 Locm 300-399Mg /Ml Iodine,1Ml - 06/05/2018, 2017    NON-GU PSH: Cardiac Stent  Placement Heart Surgery (Unspecified) Nose Surgery (Unspecified) Remove Tonsils - 2014    GU PMH: Gross hematuria - 06/25/2018, - 05/30/2018, - 2017, Gross hematuria, - 2016 Prostate Cancer - 06/25/2018, - 05/30/2018, - 02/06/2018, - 07/17/2017, - 08/28/2016, - 2017, - 2017, Prostate cancer, - 2016 Nocturia - 02/06/2018 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      NON-GU PMH:  Encounter for general adult medical examination without abnormal findings, Encounter for preventive health examination - 2015 Personal history of other diseases of the digestive system, History of esophageal reflux - 2014 Personal history of other endocrine, nutritional and metabolic disease, History of hypercholesterolemia - 2014    FAMILY HISTORY: Brain Cancer - Father Death In The Family Father - Runs In Family Death In The Family Mother - Runs In Family non-Hodgkin's lymphoma - Mother   SOCIAL HISTORY: Marital Status: Married Preferred Language: English; Ethnicity: Not Hispanic Or Latino; Race: White Current Smoking Status: Patient does not smoke anymore.  Types of alcohol consumed: Beer, Liquor, Wine.  Does not drink caffeine. Patient's occupation Brewing technologist.     Notes: Former smoker, History of tobacco use, Alcohol Use, Occupation:, Marital History - Currently Married   REVIEW OF SYSTEMS:    GU Review Male:   Patient reports frequent urination, hard to postpone urination, and get up at night to urinate. Patient denies burning/ pain with urination, leakage of urine, stream starts and stops, trouble starting your stream, have to strain to urinate , erection problems, and penile pain.  Gastrointestinal (Upper):   Patient denies nausea, vomiting, and indigestion/ heartburn.  Gastrointestinal (Lower):   Patient denies diarrhea and constipation.  Constitutional:   Patient denies fever, night sweats, weight loss, and fatigue.  Skin:   Patient denies skin rash/ lesion and itching.  Eyes:   Patient denies blurred vision and double vision.  Ears/ Nose/ Throat:   Patient denies sore throat and sinus problems.  Hematologic/Lymphatic:   Patient denies swollen glands and easy bruising.  Cardiovascular:   Patient denies leg swelling and chest pains.  Respiratory:   Patient denies cough and shortness of breath.  Endocrine:   Patient denies excessive thirst.  Musculoskeletal:   Patient denies  back pain and joint pain.  Neurological:   Patient denies headaches and dizziness.  Psychologic:   Patient denies depression and anxiety.   VITAL SIGNS:      07/05/2018 12:54 PM  Weight 157 lb / 71.21 kg  BP 109/57 mmHg  Pulse 64 /min  Temperature 98.1 F / 36.7 C   MULTI-SYSTEM PHYSICAL EXAMINATION:    Constitutional: Well-nourished. No physical deformities. Normally developed. Good grooming.  Neck: Neck symmetrical, not swollen. Normal tracheal position.  Respiratory: No labored breathing, no use of accessory muscles.   Cardiovascular: Normal temperature, normal extremity pulses, no swelling, no varicosities.  Skin: No paleness, no jaundice, no cyanosis. No lesion, no ulcer, no rash.  Neurologic / Psychiatric: Oriented to time, oriented to place, oriented to person. No depression, no anxiety, no agitation.  Gastrointestinal: No mass, no tenderness, no rigidity, non obese abdomen.     PAST DATA REVIEWED:  Source Of History:  Patient   06/25/18 05/13/18 01/28/18 08/24/16 05/12/16 04/24/16 07/13/15 01/23/15  PSA  Total PSA 2.63 ng/mL 2.15 ng/mL 5.95 ng/mL 0.57  ng/dl 9.59  8.52  1.33  0.59     05/13/18 01/28/18 08/24/16 01/23/15 07/29/14 06/24/03  Hormones  Testosterone, Total <10 ng/dL 337.7 ng/dL 9.3 pg/dL 194  223  1.82     PROCEDURES:          Urinalysis w/Scope Dipstick Dipstick Cont'd Micro  Color: Red Bilirubin: Invalid mg/dL WBC/hpf: NS (Not Seen)  Appearance: Turbid Ketones: Invalid mg/dL RBC/hpf: >60/hpf  Specific Gravity: Invalid Blood: Invalid ery/uL Bacteria: NS (Not Seen)  pH: Invalid Protein: Invalid mg/dL Cystals: NS (Not Seen)  Glucose: Invalid mg/dL Urobilinogen: Invalid mg/dL Casts: NS (Not Seen)    Nitrites: Invalid Trichomonas: Not Present    Leukocyte Esterase: Invalid leu/uL Mucous: Present      Epithelial Cells: NS (Not Seen)      Yeast: NS (Not Seen)      Sperm: Not Present    Notes: Invalid results due to color interference Microscopic done on  unconcentrated urine    ASSESSMENT:      ICD-10 Details  1 GU:   Prostate Cancer - C61   2   Gross hematuria - R31.0   3   ED due to arterial insufficiency - N52.01    PLAN:           Orders Labs PSA, Total Testosterone          Schedule Return Visit/Planned Activity: Next Available Appointment - Schedule Surgery          Document Letter(s):  Created for Patient: Clinical Summary         Notes:   gross hematuria - discussed the nature r/b/a to cystolithopaxy, bladder bx and fulguration. He elects to proceed.   PCa - PSA was sent -- discussed the nature r/b/a to darolutamide and he'll consider.   ED - discussed IPP as he's failed most other therapies. Also off label use of liswt.   cc: Dr. Redmond School         Next Appointment:      Next Appointment: 07/09/2018 09:30 AM    Appointment Type: Nurse Visit    Location: [facility] [phone]    Provider: NVPodA NVPodA    Reason for Visit: nv/ Lupron 30mg       ** Signed by [name], M.D. on 07/05/18 at 5:38 PM (EDT)**     The information contained in this medical record document is considered private and confidential patient information. This information can only be used for the medical diagnosis and/or medical services that are being provided by the patient's selected caregivers. This information can only be distributed outside of the patient's care if the patient agrees and signs waivers of authorization for this information to be sent to an outside source or route.

## 2018-07-30 ENCOUNTER — Encounter (HOSPITAL_BASED_OUTPATIENT_CLINIC_OR_DEPARTMENT_OTHER): Payer: Self-pay | Admitting: *Deleted

## 2018-07-30 ENCOUNTER — Ambulatory Visit (HOSPITAL_BASED_OUTPATIENT_CLINIC_OR_DEPARTMENT_OTHER): Payer: Medicare Other | Admitting: Anesthesiology

## 2018-07-30 ENCOUNTER — Ambulatory Visit (HOSPITAL_BASED_OUTPATIENT_CLINIC_OR_DEPARTMENT_OTHER)
Admission: RE | Admit: 2018-07-30 | Discharge: 2018-07-30 | Disposition: A | Discharge status: Home / Self Care | Payer: Medicare Other | Source: Ambulatory Visit | Attending: Urology | Admitting: Urology

## 2018-07-30 ENCOUNTER — Other Ambulatory Visit: Payer: Self-pay

## 2018-07-30 ENCOUNTER — Encounter (HOSPITAL_BASED_OUTPATIENT_CLINIC_OR_DEPARTMENT_OTHER)
Admission: RE | Disposition: A | Discharge status: Home / Self Care | Payer: Self-pay | Source: Ambulatory Visit | Attending: Urology

## 2018-07-30 DIAGNOSIS — Z7982 Long term (current) use of aspirin: Secondary | ICD-10-CM | POA: Insufficient documentation

## 2018-07-30 DIAGNOSIS — N210 Calculus in bladder: Secondary | ICD-10-CM | POA: Insufficient documentation

## 2018-07-30 DIAGNOSIS — N3081 Other cystitis with hematuria: Secondary | ICD-10-CM | POA: Diagnosis not present

## 2018-07-30 DIAGNOSIS — Z87891 Personal history of nicotine dependence: Secondary | ICD-10-CM | POA: Diagnosis not present

## 2018-07-30 DIAGNOSIS — I771 Stricture of artery: Secondary | ICD-10-CM | POA: Insufficient documentation

## 2018-07-30 DIAGNOSIS — Z79899 Other long term (current) drug therapy: Secondary | ICD-10-CM | POA: Diagnosis not present

## 2018-07-30 DIAGNOSIS — Z923 Personal history of irradiation: Secondary | ICD-10-CM | POA: Diagnosis not present

## 2018-07-30 DIAGNOSIS — R310 Gross hematuria: Secondary | ICD-10-CM | POA: Diagnosis not present

## 2018-07-30 DIAGNOSIS — N3289 Other specified disorders of bladder: Secondary | ICD-10-CM | POA: Diagnosis not present

## 2018-07-30 DIAGNOSIS — Z8546 Personal history of malignant neoplasm of prostate: Secondary | ICD-10-CM | POA: Insufficient documentation

## 2018-07-30 HISTORY — DX: Other specified disorders of kidney and ureter: N28.89

## 2018-07-30 HISTORY — DX: Liver disease, unspecified: K76.9

## 2018-07-30 HISTORY — DX: Hydrocele, unspecified: N43.3

## 2018-07-30 HISTORY — DX: Insomnia, unspecified: G47.00

## 2018-07-30 HISTORY — DX: Heart disease, unspecified: I51.9

## 2018-07-30 HISTORY — PX: CYSTOSCOPY WITH BIOPSY: SHX5122

## 2018-07-30 HISTORY — DX: Personal history of other infectious and parasitic diseases: Z86.19

## 2018-07-30 HISTORY — DX: Presence of external hearing-aid: Z97.4

## 2018-07-30 HISTORY — DX: Personal history of diseases of the blood and blood-forming organs and certain disorders involving the immune mechanism: Z86.2

## 2018-07-30 HISTORY — DX: Personal history of other diseases of the circulatory system: Z86.79

## 2018-07-30 HISTORY — DX: Personal history of urinary calculi: Z87.442

## 2018-07-30 HISTORY — DX: Benign paroxysmal vertigo, unspecified ear: H81.10

## 2018-07-30 HISTORY — DX: Atherosclerosis of aorta: I70.0

## 2018-07-30 HISTORY — DX: Personal history of other diseases of the digestive system: Z87.19

## 2018-07-30 LAB — POCT I-STAT 4, (NA,K, GLUC, HGB,HCT)
Glucose, Bld: 101 mg/dL — ABNORMAL HIGH (ref 70–99)
HEMATOCRIT: 34 % — AB (ref 39.0–52.0)
Hemoglobin: 11.6 g/dL — ABNORMAL LOW (ref 13.0–17.0)
Potassium: 3.8 mmol/L (ref 3.5–5.1)
Sodium: 142 mmol/L (ref 135–145)

## 2018-07-30 SURGERY — CYSTOSCOPY, WITH BIOPSY
Anesthesia: General | Site: Bladder

## 2018-07-30 MED ORDER — ONDANSETRON HCL 4 MG/2ML IJ SOLN
4.0000 mg | Freq: Four times a day (QID) | INTRAMUSCULAR | Status: DC | PRN
  Filled 2018-07-30: qty 2

## 2018-07-30 MED ORDER — ONDANSETRON HCL 4 MG/2ML IJ SOLN
INTRAMUSCULAR | Status: AC
  Filled 2018-07-30: qty 2

## 2018-07-30 MED ORDER — FENTANYL CITRATE (PF) 100 MCG/2ML IJ SOLN
25.0000 ug | INTRAMUSCULAR | Status: DC | PRN
  Filled 2018-07-30: qty 1

## 2018-07-30 MED ORDER — DEXAMETHASONE SODIUM PHOSPHATE 10 MG/ML IJ SOLN
INTRAMUSCULAR | Status: AC
  Filled 2018-07-30: qty 1

## 2018-07-30 MED ORDER — CEFAZOLIN SODIUM-DEXTROSE 2-4 GM/100ML-% IV SOLN
INTRAVENOUS | Status: AC
  Filled 2018-07-30: qty 100

## 2018-07-30 MED ORDER — CEFAZOLIN SODIUM-DEXTROSE 2-4 GM/100ML-% IV SOLN
2.0000 g | Freq: Once | INTRAVENOUS | Status: AC
  Administered 2018-07-30: 2 g via INTRAVENOUS
  Filled 2018-07-30: qty 100

## 2018-07-30 MED ORDER — GLYCOPYRROLATE PF 0.2 MG/ML IJ SOSY
PREFILLED_SYRINGE | INTRAMUSCULAR | Status: DC | PRN
  Administered 2018-07-30: .2 mg via INTRAVENOUS

## 2018-07-30 MED ORDER — FENTANYL CITRATE (PF) 100 MCG/2ML IJ SOLN
INTRAMUSCULAR | Status: DC | PRN
  Administered 2018-07-30: 25 ug via INTRAVENOUS

## 2018-07-30 MED ORDER — OXYCODONE HCL 5 MG PO TABS
5.0000 mg | ORAL_TABLET | Freq: Once | ORAL | Status: DC | PRN
  Filled 2018-07-30: qty 1

## 2018-07-30 MED ORDER — STERILE WATER FOR IRRIGATION IR SOLN
Status: DC | PRN
  Administered 2018-07-30 (×2): 3000 mL

## 2018-07-30 MED ORDER — LACTATED RINGERS IV SOLN
INTRAVENOUS | Status: DC
  Administered 2018-07-30: 06:00:00 via INTRAVENOUS
  Filled 2018-07-30: qty 1000

## 2018-07-30 MED ORDER — OXYCODONE HCL 5 MG/5ML PO SOLN
5.0000 mg | Freq: Once | ORAL | Status: DC | PRN
  Filled 2018-07-30: qty 5

## 2018-07-30 MED ORDER — ONDANSETRON HCL 4 MG/2ML IJ SOLN
INTRAMUSCULAR | Status: DC | PRN
  Administered 2018-07-30: 4 mg via INTRAVENOUS

## 2018-07-30 MED ORDER — PROPOFOL 10 MG/ML IV BOLUS
INTRAVENOUS | Status: DC | PRN
  Administered 2018-07-30: 100 mg via INTRAVENOUS
  Administered 2018-07-30: 30 mg via INTRAVENOUS

## 2018-07-30 MED ORDER — LIDOCAINE 2% (20 MG/ML) 5 ML SYRINGE
INTRAMUSCULAR | Status: DC | PRN
  Administered 2018-07-30: 60 mg via INTRAVENOUS

## 2018-07-30 MED ORDER — LIDOCAINE 2% (20 MG/ML) 5 ML SYRINGE
INTRAMUSCULAR | Status: AC
  Filled 2018-07-30: qty 5

## 2018-07-30 MED ORDER — DEXAMETHASONE SODIUM PHOSPHATE 10 MG/ML IJ SOLN
INTRAMUSCULAR | Status: DC | PRN
  Administered 2018-07-30: 5 mg via INTRAVENOUS

## 2018-07-30 MED ORDER — FENTANYL CITRATE (PF) 100 MCG/2ML IJ SOLN
INTRAMUSCULAR | Status: AC
  Filled 2018-07-30: qty 2

## 2018-07-30 MED ORDER — EPHEDRINE SULFATE-NACL 50-0.9 MG/10ML-% IV SOSY
PREFILLED_SYRINGE | INTRAVENOUS | Status: DC | PRN
  Administered 2018-07-30 (×3): 10 mg via INTRAVENOUS

## 2018-07-30 MED ORDER — KETOROLAC TROMETHAMINE 30 MG/ML IJ SOLN
INTRAMUSCULAR | Status: DC | PRN
  Administered 2018-07-30: 30 mg via INTRAVENOUS

## 2018-07-30 MED ORDER — KETOROLAC TROMETHAMINE 30 MG/ML IJ SOLN
INTRAMUSCULAR | Status: AC
  Filled 2018-07-30: qty 1

## 2018-07-30 MED ORDER — PROPOFOL 10 MG/ML IV BOLUS
INTRAVENOUS | Status: AC
  Filled 2018-07-30: qty 40

## 2018-07-30 SURGICAL SUPPLY — 27 items
BAG DRAIN URO-CYSTO SKYTR STRL (DRAIN) ×3 IMPLANT
BAG DRN ANRFLXCHMBR STRAP LEK (BAG)
BAG DRN UROCATH (DRAIN) ×1
BAG URINE DRAINAGE (UROLOGICAL SUPPLIES) ×2 IMPLANT
BAG URINE LEG 19OZ MD ST LTX (BAG) IMPLANT
BAG URINE LEG 500ML (DRAIN) IMPLANT
CATH FOLEY 2WAY SLVR  5CC 16FR (CATHETERS) ×2
CATH FOLEY 2WAY SLVR 5CC 16FR (CATHETERS) IMPLANT
CLOTH BEACON ORANGE TIMEOUT ST (SAFETY) ×3 IMPLANT
ELECT REM PT RETURN 9FT ADLT (ELECTROSURGICAL) ×3
ELECTRODE REM PT RTRN 9FT ADLT (ELECTROSURGICAL) ×1 IMPLANT
GLOVE BIO SURGEON STRL SZ7 (GLOVE) ×2 IMPLANT
GLOVE BIO SURGEON STRL SZ7.5 (GLOVE) ×3 IMPLANT
GLOVE BIOGEL PI IND STRL 7.5 (GLOVE) IMPLANT
GLOVE BIOGEL PI INDICATOR 7.5 (GLOVE) ×6
GOWN STRL REUS W/ TWL XL LVL3 (GOWN DISPOSABLE) ×1 IMPLANT
GOWN STRL REUS W/TWL LRG LVL3 (GOWN DISPOSABLE) ×4 IMPLANT
GOWN STRL REUS W/TWL XL LVL3 (GOWN DISPOSABLE) ×3
HOLDER FOLEY CATH W/STRAP (MISCELLANEOUS) ×2 IMPLANT
KIT TURNOVER CYSTO (KITS) ×3 IMPLANT
MANIFOLD NEPTUNE II (INSTRUMENTS) ×2 IMPLANT
NEEDLE HYPO 22GX1.5 SAFETY (NEEDLE) IMPLANT
NS IRRIG 500ML POUR BTL (IV SOLUTION) IMPLANT
PACK CYSTO (CUSTOM PROCEDURE TRAY) ×3 IMPLANT
TUBE CONNECTING 12'X1/4 (SUCTIONS) ×1
TUBE CONNECTING 12X1/4 (SUCTIONS) ×1 IMPLANT
WATER STERILE IRR 3000ML UROMA (IV SOLUTION) ×3 IMPLANT

## 2018-07-30 NOTE — Interval H&P Note (Signed)
History and Physical Interval Note:  07/30/2018 7:30 AM  Corey Oneill  has presented today for surgery, with the diagnosis of GROSS HEMATURIA, BLADDER STONE  The various methods of treatment have been discussed with the patient and family. After consideration of risks, benefits and other options for treatment, the patient has consented to  Procedure(s): CYSTOSCOPY WITH BIOPSY/FULGURATION, CYSTOLTHOLAPAXY (N/A) as a surgical intervention .  The patient's history has been reviewed, patient examined, no change in status, stable for surgery.  I have reviewed the patient's chart and labs. He has no fever or dysuria. Hematuria cleared. Questions were answered to the patient's satisfaction. Discussed we need to be careful about fulguration as there is a risk of necrosis and bladder perforation. He may need a foley.    [name]

## 2018-07-30 NOTE — Discharge Instructions (Signed)
Post Anesthesia Home Care Instructions  Activity: Get plenty of rest for the remainder of the day. A responsible adult should stay with you for 24 hours following the procedure.  For the next 24 hours, DO NOT: -Drive a car -Paediatric nurse -Drink alcoholic beverages -Take any medication unless instructed by your physician -Make any legal decisions or sign important papers.  Meals: Start with liquid foods such as gelatin or soup. Progress to regular foods as tolerated. Avoid greasy, spicy, heavy foods. If nausea and/or vomiting occur, drink only clear liquids until the nausea and/or vomiting subsides. Call your physician if vomiting continues.  Special Instructions/Symptoms: Your throat may feel dry or sore from the anesthesia or the breathing tube placed in your throat during surgery. If this causes discomfort, gargle with warm salt water. The discomfort should disappear within 24 hours.  If you had a scopolamine patch placed behind your ear for the management of post- operative nausea and/or vomiting:  1. The medication in the patch is effective for 72 hours, after which it should be removed.  Wrap patch in a tissue and discard in the trash. Wash hands thoroughly with soap and water. 2. You may remove the patch earlier than 72 hours if you experience unpleasant side effects which may include dry mouth, dizziness or visual disturbances. 3. Avoid touching the patch. Wash your hands with soap and water after contact with the patch.     NO ADVIL, ALEVE, MOTRIN, IBUPROFEN UNTIL 2:30 PM TODAY  Cystoscopy, Care After Refer to this sheet in the next few weeks. These instructions provide you with information about caring for yourself after your procedure. Your health care provider may also give you more specific instructions. Your treatment has been planned according to current medical practices, but problems sometimes occur. Call your health care provider if you have any problems or  questions after your procedure. What can I expect after the procedure? After the procedure, it is common to have:  Mild pain when you urinate. Pain should stop within a few minutes after you urinate. This may last for up to 1 week.  A small amount of blood in your urine for several days.  Feeling like you need to urinate but producing only a small amount of urine.  Follow these instructions at home:  Medicines  Take over-the-counter and prescription medicines only as told by your health care provider.  If you were prescribed an antibiotic medicine, take it as told by your health care provider. Do not stop taking the antibiotic even if you start to feel better. General instructions   Return to your normal activities as told by your health care provider. Ask your health care provider what activities are safe for you.  Do not drive for 24 hours if you received a sedative.  Watch for any blood in your urine. If the amount of blood in your urine increases, call your health care provider.  Follow instructions from your health care provider about eating or drinking restrictions.  If a tissue sample was removed for testing (biopsy) during your procedure, it is your responsibility to get your test results. Ask your health care provider or the department performing the test when your results will be ready.  Drink enough fluid to keep your urine clear or pale yellow.  Keep all follow-up visits as told by your health care provider. This is important. Contact a health care provider if:  You have pain that gets worse or does not get  better with medicine, especially pain when you urinate.  You have difficulty urinating. Get help right away if:  You have more blood in your urine.  You have blood clots in your urine.  You have abdominal pain.  You have a fever or chills.  You are unable to urinate. This information is not intended to replace advice given to you by your health care  provider. Make sure you discuss any questions you have with your health care provider. Document Released: 05/19/2005 Document Revised: 04/06/2016 Document Reviewed: 09/16/2015 Elsevier Interactive Patient Education  [employer].

## 2018-07-30 NOTE — Anesthesia Preprocedure Evaluation (Addendum)
Anesthesia Evaluation  Patient identified by MRN, date of birth, ID band Patient awake    Reviewed: Allergy & Precautions, H&P , NPO status , Patient's Chart, lab work & pertinent test results  Airway Mallampati: II   Neck ROM: full    Dental  (+) Teeth Intact, Caps, Dental Advisory Given,    Pulmonary former smoker,    breath sounds clear to auscultation       Cardiovascular + angina + CAD, + Cardiac Stents and + Peripheral Vascular Disease  + Valvular Problems/Murmurs  Rhythm:regular Rate:Normal  S/p TAVR (2017)   Neuro/Psych PSYCHIATRIC DISORDERS Depression    GI/Hepatic GERD  ,  Endo/Other    Renal/GU      Musculoskeletal   Abdominal   Peds  Hematology   Anesthesia Other Findings   Reproductive/Obstetrics                            Anesthesia Physical Anesthesia Plan  ASA: III  Anesthesia Plan: General   Post-op Pain Management:    Induction: Intravenous  PONV Risk Score and Plan: 2 and Ondansetron, Dexamethasone and Treatment may vary due to age or medical condition  Airway Management Planned: LMA  Additional Equipment:   Intra-op Plan:   Post-operative Plan:   Informed Consent: I have reviewed the patients History and Physical, chart, labs and discussed the procedure including the risks, benefits and alternatives for the proposed anesthesia with the patient or authorized representative who has indicated his/her understanding and acceptance.     Plan Discussed with: CRNA, Anesthesiologist and Surgeon  Anesthesia Plan Comments:         Anesthesia Quick Evaluation

## 2018-07-30 NOTE — Transfer of Care (Signed)
Immediate Anesthesia Transfer of Care Note  Patient: Corey Oneill  Procedure(s) Performed: CYSTOSCOPY WITH BIOPSY/FULGURATION, CYSTOLTHOLAPAXY (N/A Bladder)  Patient Location: PACU  Anesthesia Type:General  Level of Consciousness: awake, alert , oriented and patient cooperative  Airway & Oxygen Therapy: Patient Spontanous Breathing and Patient connected to nasal cannula oxygen  Post-op Assessment: Report given to RN and Post -op Vital signs reviewed and stable  Post vital signs: Reviewed and stable  Last Vitals:  Vitals Value Taken Time  BP    Temp    Pulse 99 07/30/2018  8:43 AM  Resp    SpO2 100 % 07/30/2018  8:43 AM  Vitals shown include unvalidated device data.  Last Pain:  Vitals:   07/30/18 0546  TempSrc:   PainSc: 0-No pain      Patients Stated Pain Goal: 8 (36/46/80 3212)  Complications: No apparent anesthesia complications

## 2018-07-30 NOTE — Anesthesia Procedure Notes (Signed)
Procedure Name: LMA Insertion Date/Time: 07/30/2018 7:46 AM Performed by: Wanita Chamberlain, CRNA Pre-anesthesia Checklist: Patient identified, Timeout performed, Emergency Drugs available, Suction available and Patient being monitored Patient Re-evaluated:Patient Re-evaluated prior to induction Oxygen Delivery Method: Circle system utilized Preoxygenation: Pre-oxygenation with 100% oxygen Induction Type: IV induction Ventilation: Mask ventilation without difficulty LMA: LMA inserted LMA Size: 4.0 Number of attempts: 1 Placement Confirmation: CO2 detector,  positive ETCO2 and breath sounds checked- equal and bilateral Tube secured with: Tape Dental Injury: Teeth and Oropharynx as per pre-operative assessment

## 2018-07-30 NOTE — Anesthesia Postprocedure Evaluation (Signed)
Anesthesia Post Note  Patient: Corey Oneill  Procedure(s) Performed: CYSTOSCOPY WITH BIOPSY/FULGURATION, CYSTOLTHOLAPAXY (N/A Bladder)     Patient location during evaluation: PACU Anesthesia Type: General Level of consciousness: awake and alert Pain management: pain level controlled Vital Signs Assessment: post-procedure vital signs reviewed and stable Respiratory status: spontaneous breathing, nonlabored ventilation, respiratory function stable and patient connected to nasal cannula oxygen Cardiovascular status: blood pressure returned to baseline and stable Postop Assessment: no apparent nausea or vomiting Anesthetic complications: no    Last Vitals:  Vitals:   07/30/18 0945 07/30/18 1012  BP: 123/78 124/76  Pulse: 82 77  Resp: 18 14  Temp:  36.5 C  SpO2: 100% 97%    Last Pain:  Vitals:   07/30/18 1012  TempSrc: Axillary  PainSc: 0-No pain                 Brexlee Heberlein S

## 2018-07-30 NOTE — Op Note (Signed)
Preoperative diagnosis: Gross hematuria, bladder erythema, bladder stone Postoperative diagnosis: Same  Procedure: Cystoscopy with bladder biopsy and fulguration 2 cm; cystolitholopaxy of a 9 mm bladder stone  Surgeon: Junious Silk  Anesthesia: General  Indication for procedure: 82 year old white male with a history of radiation for prostate cancer with recurrent gross hematuria.  On imaging and cystoscopy there was a stone adherent to the left bladder neck and some erythema around it.  He was brought today for cystoscopy bladder biopsy and stone removal.  Findings: On cystoscopy the urethra appeared normal, it was a bit tight in the membranous urethra and the scope appeared to dilate a wide caliber stricture.  The prostatic urethra was short and nonobstructive, but the bladder neck was elevated making the ureteral orifice visualization very difficult.  The bladder had mild trabeculation and some spotty areas especially posterior of glomerulations.  Primary the left bladder neck had an adherent stone with some oozing mucosa around it.  There were also neovascularity around the left ureteral orifice which was not approached given the proximity to the left UO.  Clear reflux was noted from both the left and the right ureteral orifice.  Description of procedure: After consent was obtained patient brought to the operating room.  After adequate anesthesia was placed in lithotomy position and prepped and draped in the usual sterile fashion.  A timeout was performed to confirm the patient and procedure.  The cystoscope was passed per urethra and the bladder inspected.  I inspected the bladder with a 30 degree and 70 degree lens.  I then tried to knock the stone off with the scope but could not do so and tried to push it off with the Bovie.  I ended up taking a rigid grasper grasping it and removing it.  The stone was 9 to 10 mm.  The base of the stone was oozing there was some erythematous mucosal lateral to it and  this was biopsied for left bladder neck biopsy.  That area was then fulgurated a total area of about 2 cm.  I then biopsied some of the glomerulations posteriorly and fulgurated this.  I lightly spot fulgurated some glomerulations at the right bladder neck posterior and left superior.  I went ahead and reinspected with the 70 degree lens to check the bladder neck and the ureteral orifices.  Hemostasis was excellent.  There was some oozing from the bladder neck just at and inside the prostatic urethra mainly from having to torque the scope down to do the work on the bladder neck.  I let this oozing remain so as not to risk any bladder neck contracture.  I did place a 64 [place] Foley for wake up and left this to gravity drainage.  Urine was clear.  He was awakened and taken to the recovery room in stable condition.  Complications: None  Blood loss: Minimal  Specimens to pathology: #1 left bladder neck biopsy #2 posterior bladder biopsy  Drains: 16 [place] Foley for wake up  Disposition: Patient stable to PACU

## 2018-07-31 ENCOUNTER — Encounter (HOSPITAL_BASED_OUTPATIENT_CLINIC_OR_DEPARTMENT_OTHER): Payer: Self-pay | Admitting: Urology

## 2018-08-01 DIAGNOSIS — Z85820 Personal history of malignant melanoma of skin: Secondary | ICD-10-CM | POA: Diagnosis not present

## 2018-08-01 DIAGNOSIS — L570 Actinic keratosis: Secondary | ICD-10-CM | POA: Diagnosis not present

## 2018-08-01 DIAGNOSIS — D485 Neoplasm of uncertain behavior of skin: Secondary | ICD-10-CM | POA: Diagnosis not present

## 2018-08-01 DIAGNOSIS — Z48817 Encounter for surgical aftercare following surgery on the skin and subcutaneous tissue: Secondary | ICD-10-CM | POA: Diagnosis not present

## 2018-08-06 DIAGNOSIS — H1859 Other hereditary corneal dystrophies: Secondary | ICD-10-CM | POA: Diagnosis not present

## 2018-08-06 DIAGNOSIS — H0100B Unspecified blepharitis left eye, upper and lower eyelids: Secondary | ICD-10-CM | POA: Diagnosis not present

## 2018-08-06 DIAGNOSIS — H0100A Unspecified blepharitis right eye, upper and lower eyelids: Secondary | ICD-10-CM | POA: Diagnosis not present

## 2018-08-07 DIAGNOSIS — R310 Gross hematuria: Secondary | ICD-10-CM | POA: Diagnosis not present

## 2018-08-07 DIAGNOSIS — R3915 Urgency of urination: Secondary | ICD-10-CM | POA: Diagnosis not present

## 2018-08-15 ENCOUNTER — Other Ambulatory Visit (INDEPENDENT_AMBULATORY_CARE_PROVIDER_SITE_OTHER): Payer: Medicare Other

## 2018-08-15 DIAGNOSIS — Z23 Encounter for immunization: Secondary | ICD-10-CM

## 2018-08-20 DIAGNOSIS — C61 Malignant neoplasm of prostate: Secondary | ICD-10-CM | POA: Diagnosis not present

## 2018-08-20 DIAGNOSIS — L570 Actinic keratosis: Secondary | ICD-10-CM | POA: Diagnosis not present

## 2018-08-21 ENCOUNTER — Other Ambulatory Visit: Payer: Self-pay | Admitting: Family Medicine

## 2018-08-21 DIAGNOSIS — D696 Thrombocytopenia, unspecified: Secondary | ICD-10-CM

## 2018-08-21 NOTE — Telephone Encounter (Signed)
[facility] is requesting to fill pt cymbalta. Please advise . [place]

## 2018-08-26 DIAGNOSIS — R350 Frequency of micturition: Secondary | ICD-10-CM | POA: Diagnosis not present

## 2018-08-26 DIAGNOSIS — C61 Malignant neoplasm of prostate: Secondary | ICD-10-CM | POA: Diagnosis not present

## 2018-08-27 DIAGNOSIS — F4323 Adjustment disorder with mixed anxiety and depressed mood: Secondary | ICD-10-CM | POA: Diagnosis not present

## 2018-09-12 ENCOUNTER — Telehealth: Payer: Self-pay

## 2018-09-12 ENCOUNTER — Telehealth: Payer: Self-pay | Admitting: Family Medicine

## 2018-09-12 NOTE — Telephone Encounter (Signed)
Pt ret your call

## 2018-09-12 NOTE — Telephone Encounter (Signed)
Done KH 

## 2018-09-12 NOTE — Telephone Encounter (Signed)
Called pt to advise of fall and depression screening needed. LVM for pt to call back to the office. [place]

## 2018-09-18 ENCOUNTER — Telehealth: Payer: Self-pay | Admitting: Cardiovascular Disease

## 2018-09-18 NOTE — Telephone Encounter (Signed)
  Patient needs to make 6 mo f/u appt after 11/30/18. Please call him back tomorrow 09/19/18

## 2018-09-19 NOTE — Telephone Encounter (Signed)
Scheduled patient for 6 mo visit 12/09/18.  Left message for patient to call back and confirm or cancel.

## 2018-09-25 NOTE — Telephone Encounter (Signed)
Called to confirm appointment in January with patient and he asked if he could be seen earlier because he has been having chest discomfort.  He reports he has had intermittent chest discomfort for several days - mostly at night.  He took 2 NTG tablets last night and felt much better, but the pain wasn't totally alleviated. Currently, he has no discomfort.  Scheduled him for evaluation tomorrow with Cecilie Kicks, NP. He understands to seek emergency medical attention if symptoms worsen prior to that time.  He was grateful for assistance.

## 2018-09-26 ENCOUNTER — Other Ambulatory Visit: Payer: Self-pay | Admitting: Cardiovascular Disease

## 2018-09-26 ENCOUNTER — Encounter: Payer: Self-pay | Admitting: Cardiology

## 2018-09-26 ENCOUNTER — Ambulatory Visit (INDEPENDENT_AMBULATORY_CARE_PROVIDER_SITE_OTHER): Payer: Medicare Other | Admitting: Cardiology

## 2018-09-26 VITALS — BP 120/58 | HR 77 | Ht 70.0 in | Wt 155.8 lb

## 2018-09-26 DIAGNOSIS — I25118 Atherosclerotic heart disease of native coronary artery with other forms of angina pectoris: Secondary | ICD-10-CM | POA: Diagnosis not present

## 2018-09-26 DIAGNOSIS — Z952 Presence of prosthetic heart valve: Secondary | ICD-10-CM

## 2018-09-26 DIAGNOSIS — R079 Chest pain, unspecified: Secondary | ICD-10-CM | POA: Diagnosis not present

## 2018-09-26 DIAGNOSIS — D696 Thrombocytopenia, unspecified: Secondary | ICD-10-CM | POA: Diagnosis not present

## 2018-09-26 DIAGNOSIS — I2510 Atherosclerotic heart disease of native coronary artery without angina pectoris: Secondary | ICD-10-CM

## 2018-09-26 NOTE — Patient Instructions (Signed)
Medication Instructions:  Your physician recommends that you continue on your current medications as directed. Please refer to the Current Medication list given to you today.  If you need a refill on your cardiac medications before your next appointment, please call your pharmacy.   Lab work: TODAY: CBC, BMP  If you have labs (blood work) drawn today and your tests are completely normal, you will receive your results only by: [name] MyChart Message (if you have MyChart) OR . A paper copy in the mail If you have any lab test that is abnormal or we need to change your treatment, we will call you to review the results.  Testing/Procedures: Your physician has requested that you have an echocardiogram. Echocardiography is a painless test that uses sound waves to create images of your heart. It provides your doctor with information about the size and shape of your heart and how well your heart's chambers and valves are working. This procedure takes approximately one hour. There are no restrictions for this procedure.  Your physician has requested that you have a stress echocardiogram. For further information please visit [email or web address]. Please follow instruction sheet as given.    Follow-Up: At [facility], you and your health needs are our priority.  As part of our continuing mission to provide you with exceptional heart care, we have created designated Provider Care Teams.  These Care Teams include your primary Cardiologist (physician) and Advanced Practice Providers (APPs -  Physician Assistants and Nurse Practitioners) who all work together to provide you with the care you need, when you need it. You will need a follow up appointment in:  2 weeks.You may see [name], MD or one of the following Advanced Practice Providers on your designated Care Team: Richardson Dopp, PA-C [place], [place] . Daune Perch, NP  Any Other Special Instructions Will Be Listed Below (If Applicable).

## 2018-09-26 NOTE — Progress Notes (Signed)
Cardiology Office Note   Date:  09/26/2018   ID:  Corey Oneill, DOB 05/17/1936, MRN [id number]  PCP:  [name], MD  Cardiologist:  Dr. Burt Knack    Chief Complaint  Patient presents with  . Chest Pain      History of Present Illness: Corey Oneill is a 82 y.o. male who presents for chest pain.    The patient has coronary artery disease and aortic valve disease. He has undergone stenting of the RCA. He underwent TAVR in 2017 for treatment of severe symptomatic aortic stenosis.   Last cardiac cath 12/26/16 with mild non obstructive disease and patent distal l stent to RCA   Pt now with chest discomfort that began about 1 week ago, not severe pain but an awareness in chest.  It usually lasts all day, though at times is not present.  He has tried NTG the last time at 0500 this AM with some relief.  No pain currently.  No associated symptoms with the discomfort of SOB, nausea or diaphoresis.  He does have a cold currently.  He also tells me he has hematuria and some bright blood in his stool.  The hematuria has been more chronic.  He also has thrombocytopenia with last labs last year and plts of 57.  Pt does not admit to knowing about his low plts.  He has not seen PCP in some time.        Past Medical History:  Diagnosis Date  . Abdominal aortic atherosclerosis ([place])   . Anginal pain ([place])   . BPPV (benign paroxysmal positional vertigo)   . CAD (coronary artery disease)    a.  LHC 8/16: Mid to distal LAD 30%, OM1 40%, proximal mid RCA 40%, distal RCA 60% >> FFR 0.69  >> PCI: 3 x 15 mm Resolute DES  . Depression   . Esophageal reflux   . Family history of adverse reaction to anesthesia    "daughter has PONV"  . Grade I diastolic dysfunction 37/85/8850   Noted on ECHO   . H/O blood clots    "get them in my stool and urine" (11/12/2015)  . Heart murmur   . Hepatic lesion 12/08/2015   Stable 8 mm right hepatic lesion  . History of aortic stenosis    a. peak to peak  gradient by LHC 8/16:  37 mmHg (moderately severe)   . History of appendicitis 2016  . History of herpes labialis   . History of ITP    2018, thrombocytopenia  . History of kidney stones   . Hx of radiation therapy 04/14/13-06/09/13   prostate 7800 cGy, 40 sessions, seminal vesicles 5600 cGy 40 sessions  . Hydrocele 2006   Small  . Hyperlipidemia   . Insomnia    resolved  . Malignant melanoma in situ ([place]) 09/04/2016   Right neck and chest  . Prostate cancer ([place]) dx'd 2014  . Radiation proctitis   . Renal mass 2017   Bilateral renal masses  . Wears hearing aid in both ears     Past Surgical History:  Procedure Laterality Date  . CARDIAC CATHETERIZATION N/A 04/21/2015   Procedure: Right/Left Heart Cath and Coronary Angiography;  Surgeon: [name], MD;  Location: [place] [facility];  Service: Cardiovascular;  Laterality: N/A;  . CARDIAC CATHETERIZATION N/A 06/18/2015   Procedure: Intravascular Pressure Wire/FFR Study;  Surgeon: [name], MD;  Location: [facility];  Service: Cardiovascular;  Laterality: N/A;  .  CARDIAC CATHETERIZATION N/A 06/18/2015   Procedure: Coronary Stent Intervention;  Surgeon: [name], MD;  Location: [place] [facility];  Service: Cardiovascular;  Laterality: N/A;  . CARDIAC CATHETERIZATION N/A 06/18/2015   Procedure: Right/Left Heart Cath and Coronary Angiography;  Surgeon: [name], MD;  Location: [place] [facility];  Service: Cardiovascular;  Laterality: N/A;  . CARDIAC CATHETERIZATION N/A 06/23/2015   Procedure: Left Heart Cath and Cors/Grafts Angiography;  Surgeon: [name], MD;  Location: [place] [facility];  Service: Cardiovascular;  Laterality: N/A;  . CARDIAC CATHETERIZATION N/A 01/17/2016   Procedure: Right/Left Heart Cath and Coronary Angiography;  Surgeon: [name], MD;  Location: [facility] [facility];  Service: Cardiovascular;  Laterality: N/A;  . CATARACT EXTRACTION, BILATERAL    . COLONOSCOPY  06/26/2017  . CYSTOSCOPY  WITH BIOPSY N/A 07/30/2018   Procedure: CYSTOSCOPY WITH BIOPSY/FULGURATION, CYSTOLTHOLAPAXY;  Surgeon: [name], MD;  Location: [facility];  Service: Urology;  Laterality: N/A;  . FRACTURE SURGERY    . INGUINAL HERNIA REPAIR     patient does not remember this procedure  . LAPAROSCOPIC APPENDECTOMY N/A 11/16/2015   Procedure: APPENDECTOMY LAPAROSCOPIC;  Surgeon: [name], MD;  Location: [place];  Service: General;  Laterality: N/A;  . LEFT HEART CATH AND CORONARY ANGIOGRAPHY N/A 12/26/2016   Procedure: Left Heart Cath and Coronary Angiography;  Surgeon: Troy Sine, MD;  Location: [place];  Service: Cardiovascular;  Laterality: N/A;  . MELANOMA EXCISION Right 10/24/2016   Procedure: WIDE EXCISION MELANOMA RIGHT NECK AND RIGHT CHEST TIMES 2;  Surgeon: [name], MD;  Location: [facility] [place];  Service: General;  Laterality: Right;  right medial and lateral lesion of chest and right neck  . NASAL FRACTURE SURGERY     "broken years ago; several ORs to correct it"  . NASAL SEPTUM SURGERY    . PROSTATE BIOPSY  2014   "needle biopsy"  . TEE WITHOUT CARDIOVERSION N/A 02/15/2016   Procedure: TRANSESOPHAGEAL ECHOCARDIOGRAM (TEE);  Surgeon: [name], MD;  Location: [place];  Service: Open Heart Surgery;  Laterality: N/A;  . TONSILLECTOMY    . TRANSCATHETER AORTIC VALVE REPLACEMENT, TRANSFEMORAL  02/15/2016  . TRANSCATHETER AORTIC VALVE REPLACEMENT, TRANSFEMORAL N/A 02/15/2016   Procedure: TRANSCATHETER AORTIC VALVE REPLACEMENT, TRANSFEMORAL;  Surgeon: [name], MD;  Location: [facility];  Service: Open Heart Surgery;  Laterality: N/A;     Current Outpatient Medications  Medication Sig Dispense Refill  . aspirin 81 MG tablet Take 81 mg by mouth daily.    . carvedilol (COREG) 3.125 MG tablet Take 1 tablet (3.125 mg total) by mouth 2 (two) times daily. 60 tablet 11  . DULoxetine (CYMBALTA) 30 MG capsule TAKE (1) CAPSULE DAILY. 30 capsule 2  .  isosorbide mononitrate (IMDUR) 30 MG 24 hr tablet Take 30 mg by mouth daily.     [name] MAGNESIUM CITRATE PO Take 2,500 Units by mouth.    . MULTIPLE VITAMIN PO Take 1 tablet by mouth daily.    . rosuvastatin (CRESTOR) 10 MG tablet TAKE 1 TABLET ONCE DAILY. 90 tablet 0  . nitroGLYCERIN (NITROSTAT) 0.4 MG SL tablet 1 TAB UNDER TONGUE AS NEEDED FOR CHEST PAIN. MAY REPEAT EVERY 5 MIN FOR A TOTAL OF 3 DOSES. 25 tablet 4   No current facility-administered medications for this visit.     Allergies:   Patient has no known allergies.    Social History:  The patient  reports that he has  quit smoking. His smoking use included cigarettes. He smoked 0.00 packs per day. He has never used smokeless tobacco. He reports that he drinks alcohol. He reports that he does not use drugs.   Family History:  The patient's family history includes Brain cancer in his father; Depression in his daughter; Lymphoma in his mother.    ROS:  General:+ colds no fevers, no weight changes Skin:no rashes or ulcers HEENT:no blurred vision, no congestion CV:see HPI PUL:see HPI GI:no diarrhea constipation or melena, no indigestion GU:no hematuria, no dysuria MS:no joint pain, no claudication Neuro:no syncope, no lightheadedness Endo:no diabetes, no thyroid disease   Wt Readings from Last 3 Encounters:  09/26/18 155 lb 12.8 oz (70.7 kg)  07/30/18 156 lb 9.6 oz (71 kg)  06/04/18 158 lb 12.8 oz (72 kg)     PHYSICAL EXAM: VS:  BP (!) 120/58   Pulse 77   Ht 5\' 10"  (1.778 m)   Wt 155 lb 12.8 oz (70.7 kg)   SpO2 97%   BMI 22.35 kg/m  , BMI Body mass index is 22.35 kg/m. General:Pleasant affect, NAD Skin:Warm and dry, brisk capillary refill HEENT:normocephalic, sclera clear, mucus membranes moist Neck:supple, no JVD, no bruits  Heart:S1S2 RRR without murmur, gallup, rub or click Lungs:clear without rales, rhonchi, or wheezes HAL:PFXT, non tender, + BS, do not palpate liver spleen or masses Ext:no lower ext edema, 2+  pedal pulses, 2+ radial pulses Neuro:alert and oriented X 3, MAE, follows commands, + facial symmetry    EKG:  EKG is ordered today. The ekg ordered today demonstrated SR with PVC but no changes from previous EKG and normal.    Recent Labs: 09/26/2018: BUN 16; Creatinine, Ser 0.99; Hemoglobin WILL FOLLOW; Platelets WILL FOLLOW; Potassium 4.3; Sodium 138    Lipid Panel    Component Value Date/Time   CHOL 182 07/05/2017 0957   TRIG 67 07/05/2017 0957   HDL 61 07/05/2017 0957   CHOLHDL 3.0 07/05/2017 0957   VLDL 13 07/05/2017 0957   LDLCALC 108 (H) 07/05/2017 0957       Other studies Reviewed: Additional studies/ records that were reviewed today include: . Echo 02/22/17  Study Conclusions  - Left ventricle: The cavity size was normal. Wall thickness was   normal. Systolic function was normal. The estimated ejection   fraction was in the range of 60% to 65%. Wall motion was normal;   there were no regional wall motion abnormalities. Doppler   parameters are consistent with abnormal left ventricular   relaxation (grade 1 diastolic dysfunction). The E/e&' ratio is   between 8-15, suggesting indeterminate LV filling pressure. - Aortic valve: S/p bioprosthetic stent valve. No obstruction or   paravalvular leak. Mean gradient (S): 10 mm Hg. Peak gradient   (S): 21 mm Hg. - Mitral valve: Calcified annulus. Mildly thickened leaflets .   There was trivial regurgitation. - Left atrium: The atrium was normal in size. - Inferior vena cava: The vessel was normal in size. The   respirophasic diameter changes were in the normal range (>= 50%),   consistent with normal central venous pressure.  Impressions:  - Compared to a prior study in 03/2016, the aortic bioprosthesis is   stable and non-obstructed without paravalvular leak. LVEF is   higher at 60-65%.  Cardiac cath 12/26/16   Prox RCA lesion, 35 %stenosed.  Mid RCA lesion, 30 %stenosed.  Dist RCA lesion, 0  %stenosed.  Mid LAD lesion, 20 %stenosed.   Mild nonobstructive CAD with smooth 20%  narrowing in the LAD after the proximal septal perforating artery; normal left circumflex coronary artery; mild calcification of the RCA with 30-40% proximal stenosis, 30% mid stenosis, and patent distal RCA stent.  The TAVR valve appeared well-seated.  RECOMMENDATION: Medical therapy.  Consider possible nonischemic causes of chest pain.  Hematology consultation is recommended for further evaluation of the patient's development of significant thrombocytopenia.   ASSESSMENT AND PLAN:  1.  Chest pain with hx of CAD and last nuc was + for ischemia, but cath was with non obstructive disease and patent Stent to RCA.  I discussed with Dr. Burt Knack and will so stress echo to eval for ischemia.  Will order TTE as well.  If symptoms increase pt to go to ER.   The chest discomfort precedes the cold   Follow up in 2-3 weeks with Dr. Burt Knack or APP.  2.  AS with hx of TAVR, stable valve.  3.  HLD stable.  4.  Thrombocytopenia on last eval.  Now with hematuria will check CBC to eval for anemia as cause of chest pain.  If plts still low will send to hematology  This dates back to 02/2017 at least.  5.   Hematuria was seen by Dr. Junious Silk and cysto was done 07/30/18 -bladder stone was cause of hematuria.    Current medicines are reviewed with the patient today.  The patient Has no concerns regarding medicines.  The following changes have been made:  See above Labs/ tests ordered today include:see above  Disposition:   FU:  see above  Signed, Cecilie Kicks, NP  09/26/2018 5:28 PM    [place], [place], [place], [place] Phone: [phone]; Fax: [phone]

## 2018-09-27 ENCOUNTER — Telehealth: Payer: Self-pay | Admitting: Family Medicine

## 2018-09-27 LAB — BASIC METABOLIC PANEL
BUN/Creatinine Ratio: 16 (ref 10–24)
BUN: 16 mg/dL (ref 8–27)
CALCIUM: 9 mg/dL (ref 8.6–10.2)
CHLORIDE: 99 mmol/L (ref 96–106)
CO2: 24 mmol/L (ref 20–29)
Creatinine, Ser: 0.99 mg/dL (ref 0.76–1.27)
GFR calc non Af Amer: 72 mL/min/{1.73_m2} (ref >59)
GFR, EST AFRICAN AMERICAN: 83 mL/min/{1.73_m2} (ref >59)
Glucose: 93 mg/dL (ref 65–99)
Potassium: 4.3 mmol/L (ref 3.5–5.2)
Sodium: 138 mmol/L (ref 134–144)

## 2018-09-27 LAB — CBC
Hematocrit: 36.5 % — ABNORMAL LOW (ref 37.5–51.0)
Hemoglobin: 13 g/dL (ref 13.0–17.7)
MCH: 32.1 pg (ref 26.6–33.0)
MCHC: 35.6 g/dL (ref 31.5–35.7)
MCV: 90 fL (ref 79–97)
PLATELETS: 40 10*3/uL — AB (ref 150–450)
RBC: 4.05 x10E6/uL — ABNORMAL LOW (ref 4.14–5.80)
RDW: 11.6 % — ABNORMAL LOW (ref 12.3–15.4)
WBC: 3 10*3/uL — ABNORMAL LOW (ref 3.4–10.8)

## 2018-09-27 NOTE — Telephone Encounter (Signed)
Pt called that Cardiac tests were good, having stress test next week Cardilogist [name] but platelets were low and he wanted to let you know.

## 2018-10-01 ENCOUNTER — Other Ambulatory Visit: Payer: Self-pay

## 2018-10-03 ENCOUNTER — Telehealth (HOSPITAL_COMMUNITY): Payer: Self-pay | Admitting: *Deleted

## 2018-10-03 ENCOUNTER — Ambulatory Visit (HOSPITAL_COMMUNITY): Payer: Medicare Other | Attending: Cardiovascular Disease

## 2018-10-03 ENCOUNTER — Other Ambulatory Visit: Payer: Self-pay

## 2018-10-03 DIAGNOSIS — Z952 Presence of prosthetic heart valve: Secondary | ICD-10-CM | POA: Diagnosis not present

## 2018-10-03 DIAGNOSIS — R079 Chest pain, unspecified: Secondary | ICD-10-CM | POA: Diagnosis not present

## 2018-10-03 NOTE — Telephone Encounter (Signed)
Patient given detailed instructions per Stress Test Requisition Sheet for test on 10/08/18 at 2:00.Patient Notified to arrive 30 minutes early, and that it is imperative to arrive on time for appointment to keep from having the test rescheduled.  Patient verbalized understanding. Corey Oneill

## 2018-10-04 ENCOUNTER — Telehealth (HOSPITAL_COMMUNITY): Payer: Self-pay | Admitting: *Deleted

## 2018-10-04 NOTE — Telephone Encounter (Signed)
Patient given detailed instructions per Stress Test Requisition Sheet for test on 10/08/18 at 2:00.Patient Notified to arrive 30 minutes early, and that it is imperative to arrive on time for appointment to keep from having the test rescheduled.  Patient verbalized understanding. Veronia Beets

## 2018-10-08 ENCOUNTER — Ambulatory Visit (HOSPITAL_COMMUNITY): Payer: Medicare Other

## 2018-10-08 ENCOUNTER — Ambulatory Visit (HOSPITAL_COMMUNITY): Payer: Medicare Other | Attending: Cardiovascular Disease

## 2018-10-08 DIAGNOSIS — R079 Chest pain, unspecified: Secondary | ICD-10-CM

## 2018-10-08 DIAGNOSIS — Z952 Presence of prosthetic heart valve: Secondary | ICD-10-CM | POA: Diagnosis not present

## 2018-10-08 DIAGNOSIS — C61 Malignant neoplasm of prostate: Secondary | ICD-10-CM | POA: Diagnosis not present

## 2018-10-14 ENCOUNTER — Ambulatory Visit (INDEPENDENT_AMBULATORY_CARE_PROVIDER_SITE_OTHER): Payer: Medicare Other | Admitting: Family Medicine

## 2018-10-14 ENCOUNTER — Encounter: Payer: Self-pay | Admitting: Family Medicine

## 2018-10-14 VITALS — BP 110/72 | HR 67 | Temp 97.5°F | Wt 156.8 lb

## 2018-10-14 DIAGNOSIS — I2510 Atherosclerotic heart disease of native coronary artery without angina pectoris: Secondary | ICD-10-CM | POA: Diagnosis not present

## 2018-10-14 DIAGNOSIS — F341 Dysthymic disorder: Secondary | ICD-10-CM | POA: Diagnosis not present

## 2018-10-14 MED ORDER — DULOXETINE HCL 60 MG PO CPEP: 60.0000 mg | ORAL_CAPSULE | Freq: Every day | ORAL | 3 refills | Status: DC

## 2018-10-14 NOTE — Progress Notes (Signed)
   Subjective:    Patient ID: Corey Oneill, male    DOB: 1936-02-09, 82 y.o.   MRN: [id number]  HPI He is here for consult concerning being depressed.  He has a long history of low-grade depression and has been taking Cymbalta but now feels as if it is getting worse.  He is having difficulty dealing with his age and slowing down.  Also recently has had some difficulties with his heart.  He is also been to be under good control.  He admits that his marriage is really little more than sharing space.  They now sleep in separate bedrooms and have done so for over a year.   Review of Systems     Objective:   Physical Exam Alert and in no distress.  PHQ 9 of 5.       Assessment & Plan:  Dysthymia - Plan: DULoxetine (CYMBALTA) 60 MG capsule I will increase his Cymbalta to 60 mg.  I will also refer him to [facility] and recheck here in roughly 6 weeks.

## 2018-10-14 NOTE — Patient Instructions (Signed)
Corey Oneill [phone] 

## 2018-10-22 ENCOUNTER — Encounter: Payer: Self-pay | Admitting: Physician Assistant

## 2018-10-22 ENCOUNTER — Telehealth: Payer: Self-pay | Admitting: Oncology

## 2018-10-22 ENCOUNTER — Ambulatory Visit (INDEPENDENT_AMBULATORY_CARE_PROVIDER_SITE_OTHER): Payer: Medicare Other | Admitting: Physician Assistant

## 2018-10-22 VITALS — BP 102/48 | HR 62 | Ht 70.0 in | Wt 159.4 lb

## 2018-10-22 DIAGNOSIS — I25119 Atherosclerotic heart disease of native coronary artery with unspecified angina pectoris: Secondary | ICD-10-CM

## 2018-10-22 DIAGNOSIS — E785 Hyperlipidemia, unspecified: Secondary | ICD-10-CM | POA: Diagnosis not present

## 2018-10-22 DIAGNOSIS — I2510 Atherosclerotic heart disease of native coronary artery without angina pectoris: Secondary | ICD-10-CM

## 2018-10-22 DIAGNOSIS — D696 Thrombocytopenia, unspecified: Secondary | ICD-10-CM

## 2018-10-22 DIAGNOSIS — Z952 Presence of prosthetic heart valve: Secondary | ICD-10-CM

## 2018-10-22 MED ORDER — ISOSORBIDE MONONITRATE ER 30 MG PO TB24: 30.0000 mg | ORAL_TABLET | Freq: Every day | ORAL | 11 refills | Status: DC

## 2018-10-22 NOTE — Patient Instructions (Addendum)
Medication Instructions:  No changes.  See your medication list.  If you need a refill on your cardiac medications before your next appointment, please call your pharmacy.   Lab work: None today  If you have labs (blood work) drawn today and your tests are completely normal, you will receive your results only by: [name] MyChart Message (if you have MyChart) OR . A paper copy in the mail If you have any lab test that is abnormal or we need to change your treatment, we will call you to review the results.  Testing/Procedures: None   Follow-Up: Keep follow up with Dr. Burt Knack 12/09/18  Any Other Special Instructions Will Be Listed Below (If Applicable). Please call Dr. Hazeline Junker office for a follow up on your low platelet count. Phone # [phone]

## 2018-10-22 NOTE — Progress Notes (Signed)
Cardiology Office Note:    Date:  10/22/2018   ID:  Corey Oneill, DOB February 28, 1936, MRN [id number]  PCP:  Corey Lung, MD  Cardiologist:  Corey Mocha, MD  Electrophysiologist:  None  Heme/Onc: Dr. Alen Oneill  Referring MD: Corey Lung, MD   Chief Complaint  Patient presents with  . Follow-up    Chest Pain; recent stress test    History of Present Illness:    Corey Oneill is a 82 y.o. male with CAD status post prior PCI to the RCA, aortic stenosis status post TAVR in April 2017, ITP.  He has chronic angina that has been controlled with nitrate therapy.  He was last seen by Dr. Burt Oneill in 05/2018.  He was recently seen in 09/2018 by Corey Kicks, NP for chest pain.  A stress echo was obtained and this was low risk.  Medical therapy was continued.     [name] returns for follow-up.  He is here alone.  He continues to have occasional anginal symptoms.  This seems to come on with increased stress or sometimes at night.  He also notes exertional symptoms.  He has symptoms may be once or twice a week.  He takes nitroglycerin with prompt relief.  He denies significant shortness of breath, paroxysmal nocturnal dyspnea or lower extremity swelling.  He denies syncope or dizziness.  He does have occasional blood in his stool.  This has been a chronic symptom without significant change.  Prior CV studies:   The following studies were reviewed today:  ETT-Echo 10/08/18 - Stress ECG conclusions: There were no stress arrhythmias or   conduction abnormalities. The stress ECG was negative for   ischemia. [facility] scoring: exercise time of 5 min; maximum ST   deviation of 0 mm; no angina; resulting score is 5. This score   predicts a low risk of cardiac events. - Staged echo: There was no echocardiographic evidence for   stress-induced ischemia. Impressions: - Low risk stress test. No exercise induced wall motion   abnormalities.  Echo 10/03/2018 Mod LVH, EF 60-65, no RWMA, Gr 1 DD, normally  functioning AV bioprosthesis  Echo 02/22/17 EF 60-65, normal wall motion, grade 1 diastolic dysfunction, status post TAVR without obstruction or perivalvular leak (mean 10), MAC, trivial MR   Cardiac Catheterization 12/26/16 LAD mid 20 RCA proximal 55, mid 30, distal RCA stent patent   Nuclear stress test 12/25/16 EF 67, inferoseptal/inferolateral/apical lateral nonreversible defect, inferior/inferolateral, apical inferior ischemia; high risk   Past Medical History:  Diagnosis Date  . Abdominal aortic atherosclerosis ([place])   . Anginal pain ([place])   . BPPV (benign paroxysmal positional vertigo)   . CAD (coronary artery disease)    a.  LHC 8/16: Mid to distal LAD 30%, OM1 40%, proximal mid RCA 40%, distal RCA 60% >> FFR 0.69  >> PCI: 3 x 15 mm Resolute DES  . Depression   . Esophageal reflux   . Family history of adverse reaction to anesthesia    "daughter has PONV"  . Grade I diastolic dysfunction 46/65/9935   Noted on ECHO   . H/O blood clots    "get them in my stool and urine" (11/12/2015)  . Heart murmur   . Hepatic lesion 12/08/2015   Stable 8 mm right hepatic lesion  . History of aortic stenosis    a. peak to peak gradient by LHC 8/16:  37 mmHg (moderately severe)   . History of appendicitis 2016  . History of herpes  labialis   . History of ITP    2018, thrombocytopenia  . History of kidney stones   . Hx of radiation therapy 04/14/13-06/09/13   prostate 7800 cGy, 40 sessions, seminal vesicles 5600 cGy 40 sessions  . Hydrocele 2006   Small  . Hyperlipidemia   . Insomnia    resolved  . Malignant melanoma in situ ([place]) 09/04/2016   Right neck and chest  . Prostate cancer ([place]) dx'd 2014  . Radiation proctitis   . Renal mass 2017   Bilateral renal masses  . Wears hearing aid in both ears    Surgical Hx: The patient  has a past surgical history that includes Nasal fracture surgery; Tonsillectomy; Prostate biopsy (2014); Colonoscopy (06/26/2017); Cardiac catheterization  (N/A, 04/21/2015); Cardiac catheterization (N/A, 06/18/2015); Cardiac catheterization (N/A, 06/18/2015); Cardiac catheterization (N/A, 06/18/2015); Cardiac catheterization (N/A, 06/23/2015); Nasal septum surgery; Fracture surgery; laparoscopic appendectomy (N/A, 11/16/2015); Cardiac catheterization (N/A, 01/17/2016); Transcatheter aortic valve replacement, transfemoral (02/15/2016); Transcatheter aortic valve replacement, transfemoral (N/A, 02/15/2016); TEE without cardioversion (N/A, 02/15/2016); Melanoma excision (Right, 10/24/2016); LEFT HEART CATH AND CORONARY ANGIOGRAPHY (N/A, 12/26/2016); Cataract extraction, bilateral; Inguinal hernia repair; and Cystoscopy with biopsy (N/A, 07/30/2018).   Current Medications: Current Meds  Medication Sig  . aspirin 81 MG tablet Take 81 mg by mouth daily.  . carvedilol (COREG) 3.125 MG tablet Take 1 tablet (3.125 mg total) by mouth 2 (two) times daily.  . DULoxetine (CYMBALTA) 60 MG capsule Take 1 capsule (60 mg total) by mouth daily.  . isosorbide mononitrate (IMDUR) 30 MG 24 hr tablet Take 1 tablet (30 mg total) by mouth daily.  [name] MAGNESIUM CITRATE PO Take 2,500 Units by mouth.  . MULTIPLE VITAMIN PO Take 1 tablet by mouth daily.  . nitroGLYCERIN (NITROSTAT) 0.4 MG SL tablet 1 TAB UNDER TONGUE AS NEEDED FOR CHEST PAIN. MAY REPEAT EVERY 5 MIN FOR A TOTAL OF 3 DOSES.  [name] rosuvastatin (CRESTOR) 10 MG tablet TAKE 1 TABLET ONCE DAILY.     Allergies:   Patient has no known allergies.   Social History   Tobacco Use  . Smoking status: Former Smoker    Packs/day: 0.00    Types: Cigarettes  . Smokeless tobacco: Never Used  . Tobacco comment: "quit smoking cigarettes in the 1960s; quit smoking cigars in the 1990s  Substance Use Topics  . Alcohol use: Yes    Comment: 2 DRINKS PER DAY  . Drug use: No     Family Hx: The patient's family history includes Brain cancer in his father; Depression in his daughter; Lymphoma in his mother.  ROS:   Please see the history of present  illness.    Review of Systems  HENT: Positive for hearing loss.   Psychiatric/Behavioral: Positive for depression.   All other systems reviewed and are negative.   EKGs/Labs/Other Test Reviewed:    EKG:  EKG is not ordered today.    Recent Labs: 09/26/2018: BUN 16; Creatinine, Ser 0.99; Hemoglobin 13.0; Platelets 40; Potassium 4.3; Sodium 138   Recent Lipid Panel Lab Results  Component Value Date/Time   CHOL 182 07/05/2017 09:57 AM   TRIG 67 07/05/2017 09:57 AM   HDL 61 07/05/2017 09:57 AM   CHOLHDL 3.0 07/05/2017 09:57 AM   LDLCALC 108 (H) 07/05/2017 09:57 AM    Physical Exam:    VS:  BP (!) 102/48   Pulse 62   Ht 5' 10"  (1.778 m)   Wt 159 lb 6.4 oz (72.3 kg)   SpO2 97%  BMI 22.87 kg/m     Wt Readings from Last 3 Encounters:  10/22/18 159 lb 6.4 oz (72.3 kg)  10/14/18 156 lb 12.8 oz (71.1 kg)  09/26/18 155 lb 12.8 oz (70.7 kg)     Physical Exam  Constitutional: He is oriented to person, place, and time. He appears well-developed and well-nourished. No distress.  HENT:  Head: Normocephalic and atraumatic.  Eyes: No scleral icterus.  Neck: No JVD present. No thyromegaly present.  Cardiovascular: Normal rate and regular rhythm.  Murmur heard.  Systolic murmur is present with a grade of 2/6 at the upper left sternal border. Pulmonary/Chest: Effort normal. He has no rales.  Abdominal: Soft. He exhibits no distension.  Musculoskeletal: He exhibits no edema.  Lymphadenopathy:    He has no cervical adenopathy.  Neurological: He is alert and oriented to person, place, and time.  Skin: Skin is warm and dry.  Psychiatric: He has a normal mood and affect.    ASSESSMENT & PLAN:    Coronary artery disease involving native coronary artery of native heart with angina pectoris ([place]) History of prior stenting to the RCA.  Cardiac catheterization 12/2016 demonstrated patent RCA stent and mild nonobstructive disease elsewhere.  Recent stress echocardiogram was low risk  without exercise-induced wall motion abnormalities.  He has fairly chronic, stable angina.  He takes nitroglycerin for relief.  His blood pressure will not tolerate further adjustments in his antianginal therapy.  As his symptoms are fairly stable, I have recommended continuing his current medical therapy.  If his symptoms do escalate, we could consider ranolazine.  He has follow-up already scheduled in late January with Dr. Burt Oneill.  I have recommended that he keep this appointment.  Continue aspirin, beta-blocker, statin, nitrates.  Hyperlipidemia, unspecified hyperlipidemia type Continue statin therapy.  S/P TAVR (transcatheter aortic valve replacement) Recent echocardiogram with normally functioning aortic valve prosthesis and normal LV function.  Continue SBE prophylaxis per  Thrombocytopenia (HCC) Recent platelet count was 40,000.  He notes occasional, scant rectal bleeding.  I have asked him to arrange follow-up with his hematologist for further evaluation and management.     Dispo:  Return in about 7 weeks (around 12/09/2018) for Scheduled Follow Up, w/ Dr. Burt Oneill.   Medication Adjustments/Labs and Tests Ordered: Current medicines are reviewed at length with the patient today.  Concerns regarding medicines are outlined above.  Tests Ordered: No orders of the defined types were placed in this encounter.  Medication Changes: Meds ordered this encounter  Medications  . isosorbide mononitrate (IMDUR) 30 MG 24 hr tablet    Sig: Take 1 tablet (30 mg total) by mouth daily.    Dispense:  30 tablet    Refill:  11    Order Specific Question:   Supervising Provider    Answer:   Thayer Headings [[id number]]    Signed, Richardson Dopp, PA-C  10/22/2018 4:46 PM    [facility] [address], [place], [place]  [address] Phone: [phone]; Fax: [phone]

## 2018-10-22 NOTE — Telephone Encounter (Signed)
Scheduled appt per 12/10 sch message - pt is aware of appt date and time   

## 2018-10-23 ENCOUNTER — Telehealth: Payer: Self-pay

## 2018-10-23 NOTE — Telephone Encounter (Signed)
Patient called requesting clarification on next appointment. Appointment information provided.

## 2018-10-28 ENCOUNTER — Other Ambulatory Visit: Payer: Self-pay | Admitting: Family Medicine

## 2018-10-28 DIAGNOSIS — F320 Major depressive disorder, single episode, mild: Secondary | ICD-10-CM | POA: Diagnosis not present

## 2018-10-29 DIAGNOSIS — C61 Malignant neoplasm of prostate: Secondary | ICD-10-CM | POA: Diagnosis not present

## 2018-10-29 DIAGNOSIS — R350 Frequency of micturition: Secondary | ICD-10-CM | POA: Diagnosis not present

## 2018-10-29 DIAGNOSIS — H10503 Unspecified blepharoconjunctivitis, bilateral: Secondary | ICD-10-CM | POA: Diagnosis not present

## 2018-11-04 DIAGNOSIS — H0100B Unspecified blepharitis left eye, upper and lower eyelids: Secondary | ICD-10-CM | POA: Diagnosis not present

## 2018-11-04 DIAGNOSIS — H0100A Unspecified blepharitis right eye, upper and lower eyelids: Secondary | ICD-10-CM | POA: Diagnosis not present

## 2018-11-07 ENCOUNTER — Inpatient Hospital Stay: Payer: Medicare Other | Attending: Oncology | Admitting: Oncology

## 2018-11-07 ENCOUNTER — Other Ambulatory Visit: Payer: Self-pay | Admitting: Oncology

## 2018-11-08 ENCOUNTER — Telehealth: Payer: Self-pay | Admitting: Oncology

## 2018-11-08 NOTE — Telephone Encounter (Signed)
Called patient per 12/26 sch message - to r/s missed appt - unable to reach patient - left message for patient to call back to r/s

## 2018-11-11 DIAGNOSIS — Z5111 Encounter for antineoplastic chemotherapy: Secondary | ICD-10-CM | POA: Diagnosis not present

## 2018-11-11 DIAGNOSIS — C61 Malignant neoplasm of prostate: Secondary | ICD-10-CM | POA: Diagnosis not present

## 2018-11-18 DIAGNOSIS — L570 Actinic keratosis: Secondary | ICD-10-CM | POA: Diagnosis not present

## 2018-11-18 DIAGNOSIS — L0109 Other impetigo: Secondary | ICD-10-CM | POA: Diagnosis not present

## 2018-11-18 DIAGNOSIS — L8990 Pressure ulcer of unspecified site, unspecified stage: Secondary | ICD-10-CM | POA: Diagnosis not present

## 2018-11-18 DIAGNOSIS — L819 Disorder of pigmentation, unspecified: Secondary | ICD-10-CM | POA: Diagnosis not present

## 2018-11-20 DIAGNOSIS — C61 Malignant neoplasm of prostate: Secondary | ICD-10-CM | POA: Diagnosis not present

## 2018-11-20 DIAGNOSIS — R310 Gross hematuria: Secondary | ICD-10-CM | POA: Diagnosis not present

## 2018-11-20 DIAGNOSIS — N3040 Irradiation cystitis without hematuria: Secondary | ICD-10-CM | POA: Diagnosis not present

## 2018-11-22 ENCOUNTER — Telehealth: Payer: Self-pay | Admitting: Family Medicine

## 2018-11-22 DIAGNOSIS — C61 Malignant neoplasm of prostate: Secondary | ICD-10-CM | POA: Diagnosis not present

## 2018-11-22 DIAGNOSIS — R310 Gross hematuria: Secondary | ICD-10-CM | POA: Diagnosis not present

## 2018-11-22 DIAGNOSIS — R338 Other retention of urine: Secondary | ICD-10-CM | POA: Diagnosis not present

## 2018-11-22 DIAGNOSIS — N3040 Irradiation cystitis without hematuria: Secondary | ICD-10-CM | POA: Diagnosis not present

## 2018-11-22 NOTE — Telephone Encounter (Signed)
Send him a note that he has a history of dysthymia and presently is on Cymbalta and certainly counseling would be helpful

## 2018-11-22 NOTE — Telephone Encounter (Signed)
Pt's daughter called and stated that she is taking her dad to [facility]. They are requesting a statement of medical necessity. Please fax to [phone].

## 2018-11-24 ENCOUNTER — Encounter: Payer: Self-pay | Admitting: Family Medicine

## 2018-11-24 NOTE — Telephone Encounter (Signed)
Letter typed

## 2018-11-25 DIAGNOSIS — H0100B Unspecified blepharitis left eye, upper and lower eyelids: Secondary | ICD-10-CM | POA: Diagnosis not present

## 2018-11-25 DIAGNOSIS — H0100A Unspecified blepharitis right eye, upper and lower eyelids: Secondary | ICD-10-CM | POA: Diagnosis not present

## 2018-11-25 DIAGNOSIS — F320 Major depressive disorder, single episode, mild: Secondary | ICD-10-CM | POA: Diagnosis not present

## 2018-11-25 NOTE — Telephone Encounter (Signed)
Letter faxed.

## 2018-11-26 ENCOUNTER — Ambulatory Visit (INDEPENDENT_AMBULATORY_CARE_PROVIDER_SITE_OTHER): Payer: Medicare Other | Admitting: Family Medicine

## 2018-11-26 ENCOUNTER — Encounter: Payer: Self-pay | Admitting: Family Medicine

## 2018-11-26 VITALS — BP 112/72 | HR 78 | Temp 97.4°F | Ht 70.0 in | Wt 159.0 lb

## 2018-11-26 DIAGNOSIS — L989 Disorder of the skin and subcutaneous tissue, unspecified: Secondary | ICD-10-CM

## 2018-11-26 DIAGNOSIS — F341 Dysthymic disorder: Secondary | ICD-10-CM

## 2018-11-26 NOTE — Progress Notes (Signed)
   Subjective:    Patient ID: Corey Oneill, male    DOB: [birth year], 83 y.o.   MRN: [id number]  HPI He is here for follow-up visit.  He is taking Cymbalta 60 mg.  He is also started in counseling with [facility].  He has been there 1 time and does plan to go back.  He feels depressed over his age and the fact that his marriage is not what he wants.  He feels as if they are doing nothing more than sharing space.  Recently he was seen by urology and is presently being treated for UTI with cefuroxime.  He continues on tamsulosin  He was seen recently by dermatology for left cheek lesion but states that they did not do anything and he would like another opinion.  Review of Systems     Objective:   Physical Exam Alert and in no distress.  Exam of the left cheek does show thinning of the skin and erythema.       Assessment & Plan:  Dysthymia  Facial lesion - Plan: Ambulatory referral to Dermatology Encouraged him to continue in counseling and I will continue him on the 60 mg of Cymbalta.  During the encounter it was really difficult to truly assess exactly why he was here.  I think he seems to be fairly stable on his present medication regimen. He will be set up to see a different dermatologist.

## 2018-11-29 DIAGNOSIS — R338 Other retention of urine: Secondary | ICD-10-CM | POA: Diagnosis not present

## 2018-11-29 DIAGNOSIS — R310 Gross hematuria: Secondary | ICD-10-CM | POA: Diagnosis not present

## 2018-11-29 DIAGNOSIS — C61 Malignant neoplasm of prostate: Secondary | ICD-10-CM | POA: Diagnosis not present

## 2018-11-30 ENCOUNTER — Encounter (HOSPITAL_COMMUNITY): Payer: Self-pay

## 2018-11-30 ENCOUNTER — Other Ambulatory Visit: Payer: Self-pay

## 2018-11-30 ENCOUNTER — Emergency Department (HOSPITAL_COMMUNITY)
Admission: EM | Admit: 2018-11-30 | Discharge: 2018-11-30 | Disposition: A | Discharge status: Home / Self Care | Payer: Medicare Other | Attending: Emergency Medicine | Admitting: Emergency Medicine

## 2018-11-30 DIAGNOSIS — Z87891 Personal history of nicotine dependence: Secondary | ICD-10-CM | POA: Diagnosis not present

## 2018-11-30 DIAGNOSIS — T839XXA Unspecified complication of genitourinary prosthetic device, implant and graft, initial encounter: Secondary | ICD-10-CM | POA: Diagnosis not present

## 2018-11-30 DIAGNOSIS — Z7982 Long term (current) use of aspirin: Secondary | ICD-10-CM | POA: Insufficient documentation

## 2018-11-30 DIAGNOSIS — I25119 Atherosclerotic heart disease of native coronary artery with unspecified angina pectoris: Secondary | ICD-10-CM | POA: Diagnosis not present

## 2018-11-30 DIAGNOSIS — Z923 Personal history of irradiation: Secondary | ICD-10-CM | POA: Insufficient documentation

## 2018-11-30 DIAGNOSIS — T83091A Other mechanical complication of indwelling urethral catheter, initial encounter: Secondary | ICD-10-CM | POA: Diagnosis not present

## 2018-11-30 DIAGNOSIS — Z79899 Other long term (current) drug therapy: Secondary | ICD-10-CM | POA: Insufficient documentation

## 2018-11-30 DIAGNOSIS — Z8546 Personal history of malignant neoplasm of prostate: Secondary | ICD-10-CM | POA: Diagnosis not present

## 2018-11-30 DIAGNOSIS — R339 Retention of urine, unspecified: Secondary | ICD-10-CM | POA: Diagnosis not present

## 2018-11-30 DIAGNOSIS — Y732 Prosthetic and other implants, materials and accessory gastroenterology and urology devices associated with adverse incidents: Secondary | ICD-10-CM | POA: Insufficient documentation

## 2018-11-30 DIAGNOSIS — R310 Gross hematuria: Secondary | ICD-10-CM | POA: Insufficient documentation

## 2018-11-30 DIAGNOSIS — R011 Cardiac murmur, unspecified: Secondary | ICD-10-CM | POA: Insufficient documentation

## 2018-11-30 LAB — URINALYSIS, ROUTINE W REFLEX MICROSCOPIC
BILIRUBIN URINE: NEGATIVE
Glucose, UA: 50 mg/dL — AB
Ketones, ur: NEGATIVE mg/dL
Leukocytes, UA: NEGATIVE
Nitrite: NEGATIVE
Protein, ur: 100 mg/dL — AB
RBC / HPF: >50 RBC/hpf — ABNORMAL HIGH (ref 0–5)
Specific Gravity, Urine: 1.009 (ref 1.005–1.030)
pH: 6 (ref 5.0–8.0)

## 2018-11-30 NOTE — ED Provider Notes (Signed)
[facility] DEPT Provider Note   CSN: [id number] Arrival date & time: 11/30/18  1808     History   Chief Complaint Chief Complaint  Patient presents with  . Urinary Frequency  . urinary cath issues    HPI Corey Oneill is a 83 y.o. male.  HPI Patient had a Foley catheter placed approximately 1 week ago for problems with urinary retention.  He reports this is the first time he is had a problem like this.  He is not routinely have an indwelling catheter.  He states he was diagnosed with a urinary tract infection and the catheter was placed.  He reports that the urine has been very bloody and with clots.  He states that he was seen by urology yesterday and they did some type of a readjustment on the catheter but did not replace it.  He reports that that temporarily helped with drainage but starting last night again the catheter stopped draining.  He reports just on the way to the emergency department it drained some more bloody urine and also leaking bloody fluid around the catheter.  Patient denies fever chills or other constitutional symptoms.  He reports that when the catheter is not draining he starts getting a lot of suprapubic pressure and pain.  He reports that when it was drained just on the way over that relieved some pressure for him.  Patient denies he takes any blood thinners. Past Medical History:  Diagnosis Date  . Abdominal aortic atherosclerosis ([place])   . Anginal pain ([place])   . BPPV (benign paroxysmal positional vertigo)   . CAD (coronary artery disease)    a.  LHC 8/16: Mid to distal LAD 30%, OM1 40%, proximal mid RCA 40%, distal RCA 60% >> FFR 0.69  >> PCI: 3 x 15 mm Resolute DES  . Depression   . Esophageal reflux   . Family history of adverse reaction to anesthesia    "daughter has PONV"  . Grade I diastolic dysfunction 93/26/7124   Noted on ECHO   . H/O blood clots    "get them in my stool and urine" (11/12/2015)  . Heart murmur     . Hepatic lesion 12/08/2015   Stable 8 mm right hepatic lesion  . History of aortic stenosis    a. peak to peak gradient by LHC 8/16:  37 mmHg (moderately severe)   . History of appendicitis 2016  . History of herpes labialis   . History of ITP    2018, thrombocytopenia  . History of kidney stones   . Hx of radiation therapy 04/14/13-06/09/13   prostate 7800 cGy, 40 sessions, seminal vesicles 5600 cGy 40 sessions  . Hydrocele 2006   Small  . Hyperlipidemia   . Insomnia    resolved  . 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 Prostate cancer ([place]) dx'd 2014  . Radiation proctitis   . Renal mass 2017   Bilateral renal masses  . Wears hearing aid in both ears     Patient Active Problem List   Diagnosis Date Noted  . Radiation proctitis   . Hx of radiation therapy   . Heart murmur   . H/O blood clots   . Family history of adverse reaction to anesthesia   . CAD (coronary artery disease)   . Aortic stenosis   . Anginal pain ([place])   . Diarrhea in adult patient 06/06/2017  . Personal history of prostate  cancer 06/06/2017  . Melanoma ([place]) 06/06/2017  . History of adenomatous polyp of colon 06/06/2017  . History of ITP 02/26/2017  . 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 S/P appendectomy 11/12/2015  . Esophageal reflux 03/22/2015  . Depression 11/20/2014  . Prostate cancer ([place]) 03/05/2013  . Erectile dysfunction   . Hyperlipidemia 06/07/2007    Past Surgical History:  Procedure Laterality Date  . CARDIAC CATHETERIZATION N/A 04/21/2015   Procedure: Right/Left Heart Cath and Coronary Angiography;  Surgeon: [name], MD;  Location: [facility];  Service: Cardiovascular;  Laterality: N/A;  . CARDIAC CATHETERIZATION N/A 06/18/2015   Procedure: Intravascular Pressure Wire/FFR Study;  Surgeon: [name], MD;  Location:  [facility];  Service: Cardiovascular;  Laterality: N/A;  . CARDIAC CATHETERIZATION N/A 06/18/2015   Procedure: Coronary Stent Intervention;  Surgeon: [name], MD;  Location: [facility];  Service: Cardiovascular;  Laterality: N/A;  . CARDIAC CATHETERIZATION N/A 06/18/2015   Procedure: Right/Left Heart Cath and Coronary Angiography;  Surgeon: [name], MD;  Location: [facility];  Service: Cardiovascular;  Laterality: N/A;  . CARDIAC CATHETERIZATION N/A 06/23/2015   Procedure: Left Heart Cath and Cors/Grafts Angiography;  Surgeon: [name], MD;  Location: [facility];  Service: Cardiovascular;  Laterality: N/A;  . CARDIAC CATHETERIZATION N/A 01/17/2016   Procedure: Right/Left Heart Cath and Coronary Angiography;  Surgeon: [name], MD;  Location: [facility];  Service: Cardiovascular;  Laterality: N/A;  . CATARACT EXTRACTION, BILATERAL    . COLONOSCOPY  06/26/2017  . CYSTOSCOPY WITH BIOPSY N/A 07/30/2018   Procedure: CYSTOSCOPY WITH BIOPSY/FULGURATION, CYSTOLTHOLAPAXY;  Surgeon: [name], MD;  Location: [facility];  Service: Urology;  Laterality: N/A;  . FRACTURE SURGERY    . INGUINAL HERNIA REPAIR     patient does not remember this procedure  . LAPAROSCOPIC APPENDECTOMY N/A 11/16/2015   Procedure: APPENDECTOMY LAPAROSCOPIC;  Surgeon: [name], MD;  Location: [facility];  Service: General;  Laterality: N/A;  . LEFT HEART CATH AND CORONARY ANGIOGRAPHY N/A 12/26/2016   Procedure: Left Heart Cath and Coronary Angiography;  Surgeon: Troy Sine, MD;  Location: [facility];  Service: Cardiovascular;  Laterality: N/A;  . MELANOMA EXCISION Right 10/24/2016   Procedure: WIDE EXCISION MELANOMA RIGHT NECK AND RIGHT CHEST TIMES 2;  Surgeon: [name], MD;  Location: [facility];  Service: General;  Laterality: Right;  right medial and lateral lesion of chest and right neck  . NASAL FRACTURE SURGERY      "broken years ago; several ORs to correct it"  . NASAL SEPTUM SURGERY    . PROSTATE BIOPSY  2014   "needle biopsy"  . TEE WITHOUT CARDIOVERSION N/A 02/15/2016   Procedure: TRANSESOPHAGEAL ECHOCARDIOGRAM (TEE);  Surgeon: [name], MD;  Location: [facility];  Service: Open Heart Surgery;  Laterality: N/A;  . TONSILLECTOMY    . TRANSCATHETER AORTIC VALVE REPLACEMENT, TRANSFEMORAL  02/15/2016  . TRANSCATHETER AORTIC VALVE REPLACEMENT, TRANSFEMORAL N/A 02/15/2016   Procedure: TRANSCATHETER AORTIC VALVE REPLACEMENT, TRANSFEMORAL;  Surgeon: [name], MD;  Location: [facility];  Service: Open Heart Surgery;  Laterality: N/A;        Home Medications    Prior to Admission medications   Medication Sig Start Date End Date Taking? Authorizing Provider  aspirin 81 MG tablet Take 81 mg  by mouth daily.    [provider]  carvedilol (COREG) 3.125 MG tablet Take 1 tablet (3.125 mg total) by mouth 2 (two) times daily. 06/04/18   [name], MD  cefUROXime (CEFTIN) 500 MG tablet Take 500 mg by mouth 2 (two) times daily with a meal.    [provider]  Coenzyme Q10 (CO Q 10 PO) Take by mouth.    [provider]  DULoxetine (CYMBALTA) 60 MG capsule Take 1 capsule (60 mg total) by mouth daily. 10/14/18   [name], MD  isosorbide mononitrate (IMDUR) 30 MG 24 hr tablet Take 1 tablet (30 mg total) by mouth daily. 10/22/18 10/22/19  Richardson Dopp T, PA-C  MAGNESIUM CITRATE PO Take 2,500 Units by mouth.    [provider]  MULTIPLE VITAMIN PO Take 1 tablet by mouth daily.    [provider]  nitroGLYCERIN (NITROSTAT) 0.4 MG SL tablet 1 TAB UNDER TONGUE AS NEEDED FOR CHEST PAIN. MAY REPEAT EVERY 5 MIN FOR A TOTAL OF 3 DOSES. 09/26/18   [name], MD  rosuvastatin (CRESTOR) 10 MG tablet TAKE 1 TABLET ONCE DAILY. 10/28/18   [name], MD  tamsulosin (FLOMAX) 0.4 MG CAPS capsule Take 0.4 mg by mouth.    [provider]    Family  History Family History  Problem Relation Age of Onset  . Brain cancer Father   . Lymphoma Mother   . Depression Daughter     Social History Social History   Tobacco Use  . Smoking status: Former Smoker    Packs/day: 0.00    Types: Cigarettes  . Smokeless tobacco: Never Used  . Tobacco comment: "quit smoking cigarettes in the 1960s; quit smoking cigars in the 1990s  Substance Use Topics  . Alcohol use: Yes    Comment: 2 DRINKS PER DAY  . Drug use: No     Allergies   Patient has no known allergies.   Review of Systems Review of Systems 10 Systems reviewed and are negative for acute change except as noted in the HPI.   Physical Exam Updated Vital Signs BP (!) 143/74 (BP Location: Right Arm)   Pulse 92   Temp 97.9 F (36.6 C) (Oral)   Resp (!) 90   SpO2 100%   Physical Exam Constitutional:      Appearance: He is well-developed.  HENT:     Head: Normocephalic and atraumatic.  Neck:     Musculoskeletal: Neck supple.  Cardiovascular:     Rate and Rhythm: Normal rate and regular rhythm.     Heart sounds: Murmur present.  Pulmonary:     Effort: Pulmonary effort is normal.     Breath sounds: Normal breath sounds.  Abdominal:     General: Bowel sounds are normal. There is no distension.     Palpations: Abdomen is soft.     Tenderness: There is no abdominal tenderness.  Genitourinary:    Comments: Catheter is in the penis.  There is no drainage at this moment but patient's depends is saturated with bloody urine.  The catheter bag itself has about 250 mils of bloody urine. Musculoskeletal: Normal range of motion.  Skin:    General: Skin is warm and dry.  Neurological:     Mental Status: He is alert and oriented to person, place, and time.     GCS: GCS eye subscore is 4. GCS verbal subscore is 5. GCS motor subscore is 6.     Coordination: Coordination normal.  ED Treatments / Results  Labs (all labs ordered are listed, but only abnormal results are  displayed) Labs Reviewed  URINALYSIS, ROUTINE W REFLEX MICROSCOPIC - Abnormal; Notable for the following components:      Result Value   Color, Urine RED (*)    APPearance HAZY (*)    Glucose, UA 50 (*)    Hgb urine dipstick MODERATE (*)    Protein, ur 100 (*)    RBC / HPF >50 (*)    Bacteria, UA FEW (*)    All other components within normal limits    EKG None  Radiology No results found.  Procedures Procedures (including critical care time)  Medications Ordered in ED Medications - No data to display   Initial Impression / Assessment and Plan / ED Course  I have reviewed the triage vital signs and the nursing notes.  Pertinent labs & imaging results that were available during my care of the patient were reviewed by me and considered in my medical decision making (see chart for details).  Clinical Course as of Nov 30 2054  Sat Nov 30, 2018  1859 Consult:urology dr Alyson Ingles advised to flush same catheter again   [MP]    Clinical Course User Index [MP] Charlesetta Shanks, MD   Patient's catheter has been irrigated to clear as per instructions by Dr. Alyson Ingles.  Patient feels comfortable.  He has no discomfort.  There is no drainage around the catheter.  Patient has appointment on Monday.  He is aware of the need to return should the catheter become obstructed or he has other problems.  Final Clinical Impressions(s) / ED Diagnoses   Final diagnoses:  Gross hematuria  Urinary retention  Foley catheter problem, initial encounter [facility])    ED Discharge Orders    None       Charlesetta Shanks, MD 11/30/18 2057

## 2018-11-30 NOTE — Discharge Instructions (Signed)
1.  Follow-up with the urologist on Monday as scheduled. 2.  Return to the emergency department if your catheter becomes clogged and is not draining.

## 2018-11-30 NOTE — ED Notes (Signed)
Bladder scan resulted 22mL's

## 2018-11-30 NOTE — ED Triage Notes (Signed)
Pt states that his urinary cath is clogged and states that he feels as if has to urinated but is unable causing him discomfort at this time on his penis 8/10 pain . Pt states that he does have some blood clots, and had the cath placed last week d/t and infection. Pt states that he has an appt Mon with the urologist but doesn't think he can wait that long .

## 2018-11-30 NOTE — ED Notes (Signed)
Pt. Discharged with foley to leg bag.  Discharge instructions given verbalized understanding.

## 2018-12-02 DIAGNOSIS — C61 Malignant neoplasm of prostate: Secondary | ICD-10-CM | POA: Diagnosis not present

## 2018-12-02 DIAGNOSIS — R310 Gross hematuria: Secondary | ICD-10-CM | POA: Diagnosis not present

## 2018-12-02 DIAGNOSIS — R339 Retention of urine, unspecified: Secondary | ICD-10-CM | POA: Diagnosis not present

## 2018-12-04 DIAGNOSIS — C61 Malignant neoplasm of prostate: Secondary | ICD-10-CM | POA: Diagnosis not present

## 2018-12-04 DIAGNOSIS — R310 Gross hematuria: Secondary | ICD-10-CM | POA: Diagnosis not present

## 2018-12-05 DIAGNOSIS — R338 Other retention of urine: Secondary | ICD-10-CM | POA: Diagnosis not present

## 2018-12-05 DIAGNOSIS — R310 Gross hematuria: Secondary | ICD-10-CM | POA: Diagnosis not present

## 2018-12-09 ENCOUNTER — Ambulatory Visit (INDEPENDENT_AMBULATORY_CARE_PROVIDER_SITE_OTHER): Payer: Medicare Other | Admitting: Cardiovascular Disease

## 2018-12-09 ENCOUNTER — Encounter: Payer: Self-pay | Admitting: Cardiovascular Disease

## 2018-12-09 VITALS — BP 118/50 | HR 87 | Ht 70.0 in | Wt 156.8 lb

## 2018-12-09 DIAGNOSIS — D696 Thrombocytopenia, unspecified: Secondary | ICD-10-CM

## 2018-12-09 DIAGNOSIS — I359 Nonrheumatic aortic valve disorder, unspecified: Secondary | ICD-10-CM

## 2018-12-09 DIAGNOSIS — R310 Gross hematuria: Secondary | ICD-10-CM | POA: Diagnosis not present

## 2018-12-09 DIAGNOSIS — I25119 Atherosclerotic heart disease of native coronary artery with unspecified angina pectoris: Secondary | ICD-10-CM | POA: Diagnosis not present

## 2018-12-09 DIAGNOSIS — C61 Malignant neoplasm of prostate: Secondary | ICD-10-CM | POA: Diagnosis not present

## 2018-12-09 NOTE — Patient Instructions (Signed)
Medication Instructions:  Your provider recommends that you continue on your current medications as directed. Please refer to the Current Medication list given to you today.    Labwork: None  Testing/Procedures: None  Follow-Up: A message has been sent to Hematology to get an appointment.  Your provider wants you to follow-up in: 6 months with Dr. Burt Knack or his assistant. You will receive a reminder letter in the mail two months in advance. If you don't receive a letter, please call our office to schedule the follow-up appointment.

## 2018-12-09 NOTE — Progress Notes (Signed)
Cardiology Office Note:    Date:  12/09/2018   ID:  Corey Oneill, DOB [birth year], MRN [id number]  PCP:  [name], MD  Cardiologist:  [name], MD  Electrophysiologist:  None   Referring MD: [name], MD   Chief Complaint  Patient presents with  . Follow-up    CAD/Aortic valve disease    History of Present Illness:    Corey Oneill is a 83 y.o. male with a hx of coronary artery disease and PCI to the right coronary artery.  He also has a history of aortic stenosis status post TAVR in 2017.  Patient has chronic atypical angina and occasionally uses nitroglycerin.  His last cardiac catheterization demonstrated stability of his CAD and continued patency of his RCA stent.  The patient is here alone today.  He is been doing very well from a cardiac perspective.  He denies recent chest pain or pressure, dyspnea, edema, orthopnea, PND, or heart palpitations.  He is dealing with an indwelling foley catheter and is scheduled to have it removed tomorrow.  He continues to have episodic hematuria.  He was found to have fairly marked thrombocytopenia with a platelet count of 40,000.  He does carry a diagnosis of ITP.  He was referred back to hematology but it appears he missed his appointment.   Past Medical History:  Diagnosis Date  . Abdominal aortic atherosclerosis ([place])   . Anginal pain ([place])   . BPPV (benign paroxysmal positional vertigo)   . CAD (coronary artery disease)    a.  LHC 8/16: Mid to distal LAD 30%, OM1 40%, proximal mid RCA 40%, distal RCA 60% >> FFR 0.69  >> PCI: 3 x 15 mm Resolute DES  . Depression   . Esophageal reflux   . Family history of adverse reaction to anesthesia    "daughter has PONV"  . Grade I diastolic dysfunction 44/96/7591   Noted on ECHO   . H/O blood clots    "get them in my stool and urine" (11/12/2015)  . Heart murmur   . Hepatic lesion 12/08/2015   Stable 8 mm right hepatic lesion  . History of aortic stenosis    a. peak to  peak gradient by LHC 8/16:  37 mmHg (moderately severe)   . History of appendicitis 2016  . History of herpes labialis   . History of ITP    2018, thrombocytopenia  . History of kidney stones   . Hx of radiation therapy 04/14/13-06/09/13   prostate 7800 cGy, 40 sessions, seminal vesicles 5600 cGy 40 sessions  . Hydrocele 2006   Small  . Hyperlipidemia   . Insomnia    resolved  . Malignant melanoma in situ ([place]) 09/04/2016   Right neck and chest  . Prostate cancer ([place]) dx'd 2014  . Radiation proctitis   . Renal mass 2017   Bilateral renal masses  . Wears hearing aid in both ears     Past Surgical History:  Procedure Laterality Date  . CARDIAC CATHETERIZATION N/A 04/21/2015   Procedure: Right/Left Heart Cath and Coronary Angiography;  Surgeon: [name], MD;  Location: [facility];  Service: Cardiovascular;  Laterality: N/A;  . CARDIAC CATHETERIZATION N/A 06/18/2015   Procedure: Intravascular Pressure Wire/FFR Study;  Surgeon: [name], MD;  Location: [place];  Service: Cardiovascular;  Laterality: N/A;  . CARDIAC CATHETERIZATION N/A 06/18/2015   Procedure: Coronary Stent Intervention;  Surgeon: [name], MD;  Location: [facility];  Service: Cardiovascular;  Laterality: N/A;  . CARDIAC CATHETERIZATION N/A 06/18/2015   Procedure: Right/Left Heart Cath and Coronary Angiography;  Surgeon: [name], MD;  Location: [facility];  Service: Cardiovascular;  Laterality: N/A;  . CARDIAC CATHETERIZATION N/A 06/23/2015   Procedure: Left Heart Cath and Cors/Grafts Angiography;  Surgeon: [name], MD;  Location: [place] [facility];  Service: Cardiovascular;  Laterality: N/A;  . CARDIAC CATHETERIZATION N/A 01/17/2016   Procedure: Right/Left Heart Cath and Coronary Angiography;  Surgeon: [name], MD;  Location: [facility];  Service: Cardiovascular;  Laterality: N/A;  . CATARACT EXTRACTION, BILATERAL    . COLONOSCOPY  06/26/2017  .  CYSTOSCOPY WITH BIOPSY N/A 07/30/2018   Procedure: CYSTOSCOPY WITH BIOPSY/FULGURATION, CYSTOLTHOLAPAXY;  Surgeon: [name], MD;  Location: [facility];  Service: Urology;  Laterality: N/A;  . FRACTURE SURGERY    . INGUINAL HERNIA REPAIR     patient does not remember this procedure  . LAPAROSCOPIC APPENDECTOMY N/A 11/16/2015   Procedure: APPENDECTOMY LAPAROSCOPIC;  Surgeon: [name], MD;  Location: [facility];  Service: General;  Laterality: N/A;  . LEFT HEART CATH AND CORONARY ANGIOGRAPHY N/A 12/26/2016   Procedure: Left Heart Cath and Coronary Angiography;  Surgeon: Troy Sine, MD;  Location: [place] [facility];  Service: Cardiovascular;  Laterality: N/A;  . MELANOMA EXCISION Right 10/24/2016   Procedure: WIDE EXCISION MELANOMA RIGHT NECK AND RIGHT CHEST TIMES 2;  Surgeon: [name], MD;  Location: [place];  Service: General;  Laterality: Right;  right medial and lateral lesion of chest and right neck  . NASAL FRACTURE SURGERY     "broken years ago; several ORs to correct it"  . NASAL SEPTUM SURGERY    . PROSTATE BIOPSY  2014   "needle biopsy"  . TEE WITHOUT CARDIOVERSION N/A 02/15/2016   Procedure: TRANSESOPHAGEAL ECHOCARDIOGRAM (TEE);  Surgeon: [name], MD;  Location: [place];  Service: Open Heart Surgery;  Laterality: N/A;  . TONSILLECTOMY    . TRANSCATHETER AORTIC VALVE REPLACEMENT, TRANSFEMORAL  02/15/2016  . TRANSCATHETER AORTIC VALVE REPLACEMENT, TRANSFEMORAL N/A 02/15/2016   Procedure: TRANSCATHETER AORTIC VALVE REPLACEMENT, TRANSFEMORAL;  Surgeon: [name], MD;  Location: [facility];  Service: Open Heart Surgery;  Laterality: N/A;    Current Medications: Current Meds  Medication Sig  . carvedilol (COREG) 3.125 MG tablet Take 1 tablet (3.125 mg total) by mouth 2 (two) times daily.  . cefUROXime (CEFTIN) 500 MG tablet Take 500 mg by mouth 2 (two) times daily with a meal.  . Coenzyme Q10 (CO Q 10 PO) Take by mouth.  . DULoxetine  (CYMBALTA) 60 MG capsule Take 1 capsule (60 mg total) by mouth daily.  . isosorbide mononitrate (IMDUR) 30 MG 24 hr tablet Take 1 tablet (30 mg total) by mouth daily.  [name] MAGNESIUM CITRATE PO Take 2,500 Units by mouth.  . MULTIPLE VITAMIN PO Take 1 tablet by mouth daily.  . nitroGLYCERIN (NITROSTAT) 0.4 MG SL tablet 1 TAB UNDER TONGUE AS NEEDED FOR CHEST PAIN. MAY REPEAT EVERY 5 MIN FOR A TOTAL OF 3 DOSES.  [name] rosuvastatin (CRESTOR) 10 MG tablet TAKE 1 TABLET ONCE DAILY.  . tamsulosin (FLOMAX) 0.4 MG CAPS capsule Take 0.4 mg by mouth 2 (two) times daily.      Allergies:   Patient has no known allergies.   Social History   Socioeconomic History  . Marital status: Married    Spouse name: Not on file  .  Number of children: 2  . Years of education: Not on file  . Highest education level: Not on file  Occupational History  . Not on file  Social Needs  . Financial resource strain: Not on file  . Food insecurity:    Worry: Not on file    Inability: Not on file  . Transportation needs:    Medical: Not on file    Non-medical: Not on file  Tobacco Use  . Smoking status: Former Smoker    Packs/day: 0.00    Types: Cigarettes  . Smokeless tobacco: Never Used  . Tobacco comment: "quit smoking cigarettes in the 1960s; quit smoking cigars in the 1990s  Substance and Sexual Activity  . Alcohol use: Yes    Comment: 2 DRINKS PER DAY  . Drug use: No  . Sexual activity: Not on file  Lifestyle  . Physical activity:    Days per week: Not on file    Minutes per session: Not on file  . Stress: Not on file  Relationships  . Social connections:    Talks on phone: Not on file    Gets together: Not on file    Attends religious service: Not on file    Active member of club or organization: Not on file    Attends meetings of clubs or organizations: Not on file    Relationship status: Not on file  Other Topics Concern  . Not on file  Social History Narrative  . Not on file     Family  History: The patient's family history includes Brain cancer in his father; Depression in his daughter; Lymphoma in his mother.  ROS:   Please see the history of present illness.    Hearing loss, blood in urine, depression.  All other systems reviewed and are negative.  EKGs/Labs/Other Studies Reviewed:    The following studies were reviewed today: Echo 10-03-2018: Left ventricle:  The cavity size was normal. Wall thickness was increased in a pattern of moderate LVH. Systolic function was normal. The estimated ejection fraction was in the range of 60% to 65%. Wall motion was normal; there were no regional wall motion abnormalities. Doppler parameters are consistent with abnormal left ventricular relaxation (grade 1 diastolic dysfunction).  ------------------------------------------------------------------- Aortic valve:  A bioprosthesis was present.  Doppler:   There was no stenosis.   There was no significant regurgitation.  ------------------------------------------------------------------- Aorta:  The aorta was poorly visualized.  ------------------------------------------------------------------- Mitral valve:   Structurally normal valve.   Leaflet separation was normal.  Doppler:  Transvalvular velocity was within the normal range. There was no evidence for stenosis. There was trivial regurgitation.    Valve area by pressure half-time: 2.5 cm^2. Indexed valve area by pressure half-time: 1.34 cm^2/m^2.  ------------------------------------------------------------------- Left atrium:  The atrium was normal in size.  ------------------------------------------------------------------- Right ventricle:  The cavity size was normal. Systolic function was normal.  ------------------------------------------------------------------- Pulmonic valve:   Not visualized.  ------------------------------------------------------------------- Tricuspid valve:   Structurally normal  valve.   Leaflet separation was normal.  Doppler:  Transvalvular velocity was within the normal range. There was no regurgitation.  ------------------------------------------------------------------- Right atrium:  The atrium was normal in size.  ------------------------------------------------------------------- Pericardium:  There was no pericardial effusion.   ------------------------------------------------------------------- Post procedure conclusions Ascending Aorta:  - The aorta was poorly visualized.  EKG:  EKG is not ordered today.   Recent Labs: 09/26/2018: BUN 16; Creatinine, Ser 0.99; Hemoglobin 13.0; Platelets 40; Potassium 4.3; Sodium 138  Recent Lipid Panel  Component Value Date/Time   CHOL 182 07/05/2017 0957   TRIG 67 07/05/2017 0957   HDL 61 07/05/2017 0957   CHOLHDL 3.0 07/05/2017 0957   VLDL 13 07/05/2017 0957   LDLCALC 108 (H) 07/05/2017 0957    Physical Exam:    VS:  BP (!) 118/50   Pulse 87   Ht 5\' 10"  (1.778 m)   Wt 156 lb 12.8 oz (71.1 kg)   SpO2 97%   BMI 22.50 kg/m     Wt Readings from Last 3 Encounters:  12/09/18 156 lb 12.8 oz (71.1 kg)  11/26/18 159 lb (72.1 kg)  10/22/18 159 lb 6.4 oz (72.3 kg)     GEN: Pleasant elderly male in no acute distress HEENT: Normal NECK: No JVD; No carotid bruits LYMPHATICS: No lymphadenopathy CARDIAC: RRR, grade 1/6 systolic murmur at the right upper sternal border RESPIRATORY:  Clear to auscultation without rales, wheezing or rhonchi  ABDOMEN: Soft, non-tender, non-distended MUSCULOSKELETAL:  No edema; No deformity  SKIN: Warm and dry NEUROLOGIC:  Alert and oriented x 3 PSYCHIATRIC:  Normal affect   ASSESSMENT:    1. Thrombocytopenia ([place])   2. Coronary artery disease involving native coronary artery of native heart with angina pectoris ([place])   3. Aortic valve disease    PLAN:    In order of problems listed above:  1. The patient is now off of antiplatelet therapy.  I will refer him  to hematology for evaluation.  He is experiencing intermittent hematuria, but does not appear to have any other active bleeding.  His other blood counts are within normal limits. 2. The patient occasionally uses nitroglycerin but his symptoms are improved from the past.  He does not have exertional angina.  He will continue on a low-dose of carvedilol and isosorbide.  He is off of antiplatelet therapy because of a platelet count less than 50,000. 3. The patient's echocardiogram is reviewed and shows normal function of his TAVR bioprosthesis.   Medication Adjustments/Labs and Tests Ordered: Current medicines are reviewed at length with the patient today.  Concerns regarding medicines are outlined above.  Orders Placed This Encounter  Procedures  . Ambulatory referral to Hematology   No orders of the defined types were placed in this encounter.   Patient Instructions  Medication Instructions:  Your provider recommends that you continue on your current medications as directed. Please refer to the Current Medication list given to you today.    Labwork: None  Testing/Procedures: None  Follow-Up: A message has been sent to Hematology to get an appointment.  Your provider wants you to follow-up in: 6 months with Dr. Burt Knack or his assistant. You will receive a reminder letter in the mail two months in advance. If you don't receive a letter, please call our office to schedule the follow-up appointment.        Signed, [name], MD  12/09/2018 5:35 PM    [facility]

## 2018-12-10 DIAGNOSIS — R338 Other retention of urine: Secondary | ICD-10-CM | POA: Diagnosis not present

## 2018-12-10 DIAGNOSIS — R310 Gross hematuria: Secondary | ICD-10-CM | POA: Diagnosis not present

## 2018-12-11 ENCOUNTER — Other Ambulatory Visit: Payer: Self-pay | Admitting: Oncology

## 2018-12-11 DIAGNOSIS — Z862 Personal history of diseases of the blood and blood-forming organs and certain disorders involving the immune mechanism: Secondary | ICD-10-CM

## 2018-12-12 ENCOUNTER — Telehealth: Payer: Self-pay | Admitting: Oncology

## 2018-12-12 DIAGNOSIS — F320 Major depressive disorder, single episode, mild: Secondary | ICD-10-CM | POA: Diagnosis not present

## 2018-12-12 DIAGNOSIS — R310 Gross hematuria: Secondary | ICD-10-CM | POA: Diagnosis not present

## 2018-12-12 DIAGNOSIS — C61 Malignant neoplasm of prostate: Secondary | ICD-10-CM | POA: Diagnosis not present

## 2018-12-12 NOTE — Telephone Encounter (Signed)
Scheduled appt per 1/29 sch message - left message and sent reminder letter in the mail.

## 2018-12-14 DIAGNOSIS — Z9289 Personal history of other medical treatment: Secondary | ICD-10-CM

## 2018-12-14 HISTORY — DX: Personal history of other medical treatment: Z92.89

## 2018-12-16 DIAGNOSIS — C61 Malignant neoplasm of prostate: Secondary | ICD-10-CM | POA: Diagnosis not present

## 2018-12-16 DIAGNOSIS — R310 Gross hematuria: Secondary | ICD-10-CM | POA: Diagnosis not present

## 2018-12-16 DIAGNOSIS — R338 Other retention of urine: Secondary | ICD-10-CM | POA: Diagnosis not present

## 2018-12-19 ENCOUNTER — Ambulatory Visit (INDEPENDENT_AMBULATORY_CARE_PROVIDER_SITE_OTHER): Payer: Medicare Other | Admitting: Family Medicine

## 2018-12-19 ENCOUNTER — Encounter: Payer: Self-pay | Admitting: Family Medicine

## 2018-12-19 VITALS — BP 110/72 | HR 76 | Temp 97.4°F | Wt 154.6 lb

## 2018-12-19 DIAGNOSIS — F341 Dysthymic disorder: Secondary | ICD-10-CM

## 2018-12-19 DIAGNOSIS — I25119 Atherosclerotic heart disease of native coronary artery with unspecified angina pectoris: Secondary | ICD-10-CM | POA: Diagnosis not present

## 2018-12-19 MED ORDER — DULOXETINE HCL 30 MG PO CPEP: 30.0000 mg | ORAL_CAPSULE | Freq: Every day | ORAL | 0 refills | Status: DC

## 2018-12-19 MED ORDER — VORTIOXETINE HBR 5 MG PO TABS: 5.0000 mg | ORAL_TABLET | Freq: Every day | ORAL | 0 refills | Status: DC

## 2018-12-19 NOTE — Progress Notes (Signed)
   Subjective:    Patient ID: Corey Oneill, male    DOB: September 28, 1936, 83 y.o.   MRN: [id number]  HPI He is here for consult concerning all of his medications.  He is on multiple medications for his underlying cardiac condition as well as also urologic problems.  He also has an underlying history of depression and presently is on duloxetine.  His main concern is depression and the fact that he is turning 53 and that is something that has him quite concerned.   Review of Systems     Objective:   Physical Exam Alert and in no distress.  His medications were reviewed.  I did recommend       Assessment & Plan:  Dysthymia - Plan: DULoxetine (CYMBALTA) 30 MG capsule, vortioxetine HBr (TRINTELLIX) 5 MG TABS tablet that he stop the co-Q10 and I will stop the Cymbalta and switch him to Trintellix. I then discussed the fact that he is turning 83.  He has a very negative attitude towards this some I try to instill him the fact that he is alive at 43 is actually a good sign rather than a bad 1.  He will taper off the Cymbalta and start Trintellix and return here in about 3 weeks.

## 2018-12-19 NOTE — Patient Instructions (Signed)
Stop to 60 mg Cymbalta and take the 30mg  which will last a week and then start on the Trintellix 1 a day

## 2018-12-22 ENCOUNTER — Encounter (HOSPITAL_COMMUNITY): Payer: Self-pay

## 2018-12-22 ENCOUNTER — Inpatient Hospital Stay (HOSPITAL_COMMUNITY)
Admission: EM | Admit: 2018-12-22 | Discharge: 2018-12-24 | DRG: 666 | Disposition: A | Discharge status: Home / Self Care | Payer: Medicare Other | Attending: Urology | Admitting: Urology

## 2018-12-22 ENCOUNTER — Other Ambulatory Visit: Payer: Self-pay

## 2018-12-22 DIAGNOSIS — Z923 Personal history of irradiation: Secondary | ICD-10-CM

## 2018-12-22 DIAGNOSIS — E785 Hyperlipidemia, unspecified: Secondary | ICD-10-CM | POA: Diagnosis present

## 2018-12-22 DIAGNOSIS — Z952 Presence of prosthetic heart valve: Secondary | ICD-10-CM | POA: Diagnosis not present

## 2018-12-22 DIAGNOSIS — Z87442 Personal history of urinary calculi: Secondary | ICD-10-CM

## 2018-12-22 DIAGNOSIS — K219 Gastro-esophageal reflux disease without esophagitis: Secondary | ICD-10-CM | POA: Diagnosis present

## 2018-12-22 DIAGNOSIS — N3041 Irradiation cystitis with hematuria: Secondary | ICD-10-CM | POA: Diagnosis not present

## 2018-12-22 DIAGNOSIS — R310 Gross hematuria: Secondary | ICD-10-CM | POA: Diagnosis not present

## 2018-12-22 DIAGNOSIS — Z87891 Personal history of nicotine dependence: Secondary | ICD-10-CM

## 2018-12-22 DIAGNOSIS — Z8719 Personal history of other diseases of the digestive system: Secondary | ICD-10-CM

## 2018-12-22 DIAGNOSIS — Z7982 Long term (current) use of aspirin: Secondary | ICD-10-CM

## 2018-12-22 DIAGNOSIS — Z974 Presence of external hearing-aid: Secondary | ICD-10-CM

## 2018-12-22 DIAGNOSIS — N3289 Other specified disorders of bladder: Secondary | ICD-10-CM | POA: Diagnosis not present

## 2018-12-22 DIAGNOSIS — Z85820 Personal history of malignant melanoma of skin: Secondary | ICD-10-CM

## 2018-12-22 DIAGNOSIS — Z818 Family history of other mental and behavioral disorders: Secondary | ICD-10-CM

## 2018-12-22 DIAGNOSIS — C61 Malignant neoplasm of prostate: Secondary | ICD-10-CM | POA: Diagnosis not present

## 2018-12-22 DIAGNOSIS — I2510 Atherosclerotic heart disease of native coronary artery without angina pectoris: Secondary | ICD-10-CM | POA: Diagnosis present

## 2018-12-22 DIAGNOSIS — Z807 Family history of other malignant neoplasms of lymphoid, hematopoietic and related tissues: Secondary | ICD-10-CM

## 2018-12-22 DIAGNOSIS — I700 Atherosclerosis of aorta: Secondary | ICD-10-CM | POA: Diagnosis present

## 2018-12-22 DIAGNOSIS — D62 Acute posthemorrhagic anemia: Secondary | ICD-10-CM | POA: Diagnosis not present

## 2018-12-22 DIAGNOSIS — Z808 Family history of malignant neoplasm of other organs or systems: Secondary | ICD-10-CM

## 2018-12-22 DIAGNOSIS — Z955 Presence of coronary angioplasty implant and graft: Secondary | ICD-10-CM

## 2018-12-22 DIAGNOSIS — Z79899 Other long term (current) drug therapy: Secondary | ICD-10-CM

## 2018-12-22 DIAGNOSIS — F329 Major depressive disorder, single episode, unspecified: Secondary | ICD-10-CM | POA: Diagnosis present

## 2018-12-22 DIAGNOSIS — K5900 Constipation, unspecified: Secondary | ICD-10-CM | POA: Diagnosis present

## 2018-12-22 DIAGNOSIS — I959 Hypotension, unspecified: Secondary | ICD-10-CM | POA: Diagnosis not present

## 2018-12-22 DIAGNOSIS — N3941 Urge incontinence: Secondary | ICD-10-CM | POA: Diagnosis present

## 2018-12-22 DIAGNOSIS — Z862 Personal history of diseases of the blood and blood-forming organs and certain disorders involving the immune mechanism: Secondary | ICD-10-CM

## 2018-12-22 DIAGNOSIS — N500 Atrophy of testis: Secondary | ICD-10-CM | POA: Diagnosis present

## 2018-12-22 DIAGNOSIS — I9589 Other hypotension: Secondary | ICD-10-CM | POA: Diagnosis not present

## 2018-12-22 DIAGNOSIS — D696 Thrombocytopenia, unspecified: Secondary | ICD-10-CM | POA: Diagnosis present

## 2018-12-22 LAB — CBC WITH DIFFERENTIAL/PLATELET
BAND NEUTROPHILS: 31 %
BASOS PCT: 0 %
Basophils Absolute: 0 10*3/uL (ref 0.0–0.1)
Blasts: 0 %
EOS ABS: 0 10*3/uL (ref 0.0–0.5)
Eosinophils Relative: 0 %
HCT: 30.8 % — ABNORMAL LOW (ref 39.0–52.0)
Hemoglobin: 10 g/dL — ABNORMAL LOW (ref 13.0–17.0)
Lymphocytes Relative: 6 %
Lymphs Abs: 0.6 10*3/uL — ABNORMAL LOW (ref 0.7–4.0)
MCH: 32.7 pg (ref 26.0–34.0)
MCHC: 32.5 g/dL (ref 30.0–36.0)
MCV: 100.7 fL — ABNORMAL HIGH (ref 80.0–100.0)
Metamyelocytes Relative: 0 %
Monocytes Absolute: 0.2 10*3/uL (ref 0.1–1.0)
Monocytes Relative: 2 %
Myelocytes: 0 %
Neutro Abs: 9.4 10*3/uL — ABNORMAL HIGH (ref 1.7–7.7)
Neutrophils Relative %: 61 %
Other: 0 %
Platelets: 94 10*3/uL — ABNORMAL LOW (ref 150–400)
Promyelocytes Relative: 0 %
RBC: 3.06 MIL/uL — ABNORMAL LOW (ref 4.22–5.81)
RDW: 13.2 % (ref 11.5–15.5)
WBC Morphology: INCREASED
WBC: 10.2 10*3/uL (ref 4.0–10.5)
nRBC: 0 % (ref 0.0–0.2)
nRBC: 0 /100 WBC

## 2018-12-22 LAB — BASIC METABOLIC PANEL
Anion gap: 8 (ref 5–15)
BUN: 16 mg/dL (ref 8–23)
CALCIUM: 8.6 mg/dL — AB (ref 8.9–10.3)
CO2: 26 mmol/L (ref 22–32)
Chloride: 102 mmol/L (ref 98–111)
Creatinine, Ser: 0.85 mg/dL (ref 0.61–1.24)
GFR calc Af Amer: >60 mL/min (ref >60)
GFR calc non Af Amer: >60 mL/min (ref >60)
Glucose, Bld: 117 mg/dL — ABNORMAL HIGH (ref 70–99)
Potassium: 4.4 mmol/L (ref 3.5–5.1)
Sodium: 136 mmol/L (ref 135–145)

## 2018-12-22 LAB — URINALYSIS, ROUTINE W REFLEX MICROSCOPIC

## 2018-12-22 LAB — URINALYSIS, MICROSCOPIC (REFLEX): RBC / HPF: >50 RBC/hpf (ref 0–5)

## 2018-12-22 MED ORDER — ISOSORBIDE MONONITRATE ER 30 MG PO TB24: 30.0000 mg | ORAL_TABLET | Freq: Every day | ORAL | Status: DC

## 2018-12-22 MED ORDER — HYOSCYAMINE SULFATE 0.125 MG SL SUBL
0.1250 mg | SUBLINGUAL_TABLET | SUBLINGUAL | Status: DC | PRN
  Filled 2018-12-22: qty 1

## 2018-12-22 MED ORDER — TAMSULOSIN HCL 0.4 MG PO CAPS
0.4000 mg | ORAL_CAPSULE | Freq: Two times a day (BID) | ORAL | Status: DC
  Administered 2018-12-23 – 2018-12-24 (×4): 0.4 mg via ORAL
  Filled 2018-12-22 (×4): qty 1

## 2018-12-22 MED ORDER — ACETAMINOPHEN 325 MG PO TABS
650.0000 mg | ORAL_TABLET | ORAL | Status: DC | PRN
  Administered 2018-12-23: 650 mg via ORAL
  Filled 2018-12-22: qty 2

## 2018-12-22 MED ORDER — SODIUM CHLORIDE 0.9 % IV SOLN
INTRAVENOUS | Status: DC
  Administered 2018-12-23 – 2018-12-24 (×2): via INTRAVENOUS

## 2018-12-22 MED ORDER — CARVEDILOL 3.125 MG PO TABS
3.1250 mg | ORAL_TABLET | Freq: Two times a day (BID) | ORAL | Status: DC
  Administered 2018-12-23 – 2018-12-24 (×4): 3.125 mg via ORAL
  Filled 2018-12-22 (×4): qty 1

## 2018-12-22 MED ORDER — VORTIOXETINE HBR 5 MG PO TABS: 5.0000 mg | ORAL_TABLET | Freq: Every day | ORAL | Status: DC

## 2018-12-22 MED ORDER — SENNA 8.6 MG PO TABS
1.0000 | ORAL_TABLET | Freq: Two times a day (BID) | ORAL | Status: DC
  Administered 2018-12-23 – 2018-12-24 (×4): 8.6 mg via ORAL
  Filled 2018-12-22 (×4): qty 1

## 2018-12-22 MED ORDER — BACITRACIN-NEOMYCIN-POLYMYXIN 400-5-5000 EX OINT: 1.0000 "application " | TOPICAL_OINTMENT | Freq: Three times a day (TID) | CUTANEOUS | Status: DC | PRN

## 2018-12-22 MED ORDER — SODIUM CHLORIDE 0.9 % IR SOLN
3000.0000 mL | Status: DC
  Administered 2018-12-23: 3000 mL

## 2018-12-22 MED ORDER — NITROGLYCERIN 0.4 MG SL SUBL: 0.4000 mg | SUBLINGUAL_TABLET | SUBLINGUAL | Status: DC | PRN

## 2018-12-22 MED ORDER — ONDANSETRON HCL 4 MG/2ML IJ SOLN
4.0000 mg | INTRAMUSCULAR | Status: DC | PRN
  Administered 2018-12-23: 4 mg via INTRAVENOUS

## 2018-12-22 MED ORDER — DIPHENHYDRAMINE HCL 12.5 MG/5ML PO ELIX: 12.5000 mg | ORAL_SOLUTION | Freq: Four times a day (QID) | ORAL | Status: DC | PRN

## 2018-12-22 MED ORDER — DIPHENHYDRAMINE HCL 50 MG/ML IJ SOLN: 12.5000 mg | Freq: Four times a day (QID) | INTRAMUSCULAR | Status: DC | PRN

## 2018-12-22 MED ORDER — DOCUSATE SODIUM 100 MG PO CAPS
100.0000 mg | ORAL_CAPSULE | Freq: Two times a day (BID) | ORAL | Status: DC
  Administered 2018-12-23 – 2018-12-24 (×4): 100 mg via ORAL
  Filled 2018-12-22 (×4): qty 1

## 2018-12-22 MED ORDER — ROSUVASTATIN CALCIUM 10 MG PO TABS: 10.0000 mg | ORAL_TABLET | Freq: Every day | ORAL | Status: DC

## 2018-12-22 MED ORDER — DULOXETINE HCL 30 MG PO CPEP
30.0000 mg | ORAL_CAPSULE | Freq: Every day | ORAL | Status: DC
  Administered 2018-12-23 – 2018-12-24 (×2): 30 mg via ORAL
  Filled 2018-12-22 (×2): qty 1

## 2018-12-22 NOTE — ED Triage Notes (Signed)
Pt states he is having pain and bleeding from his penis. Pt states he is only having blood when he voids. Pt states he no longer has a catheter in place.

## 2018-12-22 NOTE — ED Provider Notes (Signed)
[facility] DEPT Provider Note   CSN: [id number] Arrival date & time: 12/22/18  1234     History   Chief Complaint Chief Complaint  Patient presents with  . Hematuria    HPI Corey Oneill is a 83 y.o. male.  The history is provided by the patient and medical records. No language interpreter was used.  Hematuria    Corey Oneill is a 83 y.o. male who presents to the Emergency Department complaining of difficulty urinating. He presents to the emergency department for evaluation of difficulty urinating and hematuria. He has a history of similar episodes in the past. He has been having bloodied urine intermittently for several days. He is been unable to urinate for the last 2 to 3 days. He has associated pain in his penis as well as his lower abdomen. He denies any fevers, nausea, vomiting, diarrhea. He is followed by urology and has a history of ureteral catheterization. He does not take any anticoagulants. After catheter placement he is feeling significantly improved. Past Medical History:  Diagnosis Date  . Abdominal aortic atherosclerosis ([place])   . Anginal pain ([place])   . BPPV (benign paroxysmal positional vertigo)   . CAD (coronary artery disease)    a.  LHC 8/16: Mid to distal LAD 30%, OM1 40%, proximal mid RCA 40%, distal RCA 60% >> FFR 0.69  >> PCI: 3 x 15 mm Resolute DES  . Depression   . Esophageal reflux   . Family history of adverse reaction to anesthesia    "daughter has PONV"  . Grade I diastolic dysfunction 85/88/5027   Noted on ECHO   . H/O blood clots    "get them in my stool and urine" (11/12/2015)  . Heart murmur   . Hepatic lesion 12/08/2015   Stable 8 mm right hepatic lesion  . History of aortic stenosis    a. peak to peak gradient by LHC 8/16:  37 mmHg (moderately severe)   . History of appendicitis 2016  . History of herpes labialis   . History of ITP    2018, thrombocytopenia  . History of kidney stones   . Hx of  radiation therapy 04/14/13-06/09/13   prostate 7800 cGy, 40 sessions, seminal vesicles 5600 cGy 40 sessions  . Hydrocele 2006   Small  . Hyperlipidemia   . Insomnia    resolved  . 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 Prostate cancer ([place]) dx'd 2014  . Radiation proctitis   . Renal mass 2017   Bilateral renal masses  . Wears hearing aid in both ears     Patient Active Problem List   Diagnosis Date Noted  . Gross hematuria 12/22/2018  . Radiation proctitis   . Hx of radiation therapy   . Heart murmur   . H/O blood clots   . Family history of adverse reaction to anesthesia   . CAD (coronary artery disease)   . Aortic stenosis   . Anginal pain ([place])   . Diarrhea in adult patient 06/06/2017  . Personal history of prostate cancer 06/06/2017  . Melanoma ([place]) 06/06/2017  . History of adenomatous polyp of colon 06/06/2017  . History of ITP 02/26/2017  . 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 S/P appendectomy 11/12/2015  . Esophageal reflux 03/22/2015  . Depression 11/20/2014  . Prostate cancer ([place])  03/05/2013  . Erectile dysfunction   . Hyperlipidemia 06/07/2007    Past Surgical History:  Procedure Laterality Date  . CARDIAC CATHETERIZATION N/A 04/21/2015   Procedure: Right/Left Heart Cath and Coronary Angiography;  Surgeon: [name], MD;  Location: [facility];  Service: Cardiovascular;  Laterality: N/A;  . CARDIAC CATHETERIZATION N/A 06/18/2015   Procedure: Intravascular Pressure Wire/FFR Study;  Surgeon: [name], MD;  Location: [facility];  Service: Cardiovascular;  Laterality: N/A;  . CARDIAC CATHETERIZATION N/A 06/18/2015   Procedure: Coronary Stent Intervention;  Surgeon: [name], MD;  Location: [facility];  Service: Cardiovascular;  Laterality: N/A;  . CARDIAC CATHETERIZATION N/A  06/18/2015   Procedure: Right/Left Heart Cath and Coronary Angiography;  Surgeon: [name], MD;  Location: [facility];  Service: Cardiovascular;  Laterality: N/A;  . CARDIAC CATHETERIZATION N/A 06/23/2015   Procedure: Left Heart Cath and Cors/Grafts Angiography;  Surgeon: [name], MD;  Location: [facility];  Service: Cardiovascular;  Laterality: N/A;  . CARDIAC CATHETERIZATION N/A 01/17/2016   Procedure: Right/Left Heart Cath and Coronary Angiography;  Surgeon: [name], MD;  Location: [facility];  Service: Cardiovascular;  Laterality: N/A;  . CATARACT EXTRACTION, BILATERAL    . COLONOSCOPY  06/26/2017  . CYSTOSCOPY WITH BIOPSY N/A 07/30/2018   Procedure: CYSTOSCOPY WITH BIOPSY/FULGURATION, CYSTOLTHOLAPAXY;  Surgeon: [name], MD;  Location: [facility];  Service: Urology;  Laterality: N/A;  . FRACTURE SURGERY    . INGUINAL HERNIA REPAIR     patient does not remember this procedure  . LAPAROSCOPIC APPENDECTOMY N/A 11/16/2015   Procedure: APPENDECTOMY LAPAROSCOPIC;  Surgeon: [name], MD;  Location: [facility];  Service: General;  Laterality: N/A;  . LEFT HEART CATH AND CORONARY ANGIOGRAPHY N/A 12/26/2016   Procedure: Left Heart Cath and Coronary Angiography;  Surgeon: Troy Sine, MD;  Location: [facility];  Service: Cardiovascular;  Laterality: N/A;  . MELANOMA EXCISION Right 10/24/2016   Procedure: WIDE EXCISION MELANOMA RIGHT NECK AND RIGHT CHEST TIMES 2;  Surgeon: [name], MD;  Location: [facility];  Service: General;  Laterality: Right;  right medial and lateral lesion of chest and right neck  . NASAL FRACTURE SURGERY     "broken years ago; several ORs to correct it"  . NASAL SEPTUM SURGERY    . PROSTATE BIOPSY  2014   "needle biopsy"  . TEE WITHOUT CARDIOVERSION N/A 02/15/2016   Procedure: TRANSESOPHAGEAL ECHOCARDIOGRAM (TEE);  Surgeon: [name], MD;  Location: [facility];  Service: Open Heart  Surgery;  Laterality: N/A;  . TONSILLECTOMY    . TRANSCATHETER AORTIC VALVE REPLACEMENT, TRANSFEMORAL  02/15/2016  . TRANSCATHETER AORTIC VALVE REPLACEMENT, TRANSFEMORAL N/A 02/15/2016   Procedure: TRANSCATHETER AORTIC VALVE REPLACEMENT, TRANSFEMORAL;  Surgeon: [name], MD;  Location: [facility];  Service: Open Heart Surgery;  Laterality: N/A;        Home Medications    Prior to Admission medications   Medication Sig Start Date End Date Taking? Authorizing Provider  alfuzosin (UROXATRAL) 10 MG 24 hr tablet Take 10 mg by mouth daily with breakfast.    [provider]  carvedilol (COREG) 3.125 MG tablet Take 1 tablet (3.125 mg total) by mouth 2 (two) times daily. 06/04/18   [name], MD  cefUROXime (CEFTIN) 500 MG tablet Take 500 mg by mouth 2 (two) times daily with a meal.    [provider]  Coenzyme Q10 (CO  Q 10 PO) Take by mouth.    [provider]  DULoxetine (CYMBALTA) 30 MG capsule Take 1 capsule (30 mg total) by mouth daily. 12/19/18   [name], MD  isosorbide mononitrate (IMDUR) 30 MG 24 hr tablet Take 1 tablet (30 mg total) by mouth daily. 10/22/18 10/22/19  Richardson Dopp T, PA-C  MAGNESIUM CITRATE PO Take 2,500 Units by mouth.    [provider]  MULTIPLE VITAMIN PO Take 1 tablet by mouth daily.    [provider]  nitroGLYCERIN (NITROSTAT) 0.4 MG SL tablet 1 TAB UNDER TONGUE AS NEEDED FOR CHEST PAIN. MAY REPEAT EVERY 5 MIN FOR A TOTAL OF 3 DOSES. 09/26/18   [name], MD  rosuvastatin (CRESTOR) 10 MG tablet TAKE 1 TABLET ONCE DAILY. 10/28/18   [name], MD  tamsulosin (FLOMAX) 0.4 MG CAPS capsule Take 0.4 mg by mouth 2 (two) times daily.     [provider]  vortioxetine HBr (TRINTELLIX) 5 MG TABS tablet Take 1 tablet (5 mg total) by mouth daily. 12/19/18   [name], MD    Family History Family History  Problem Relation Age of Onset  . Brain cancer Father   . Lymphoma Mother   . Depression  Daughter     Social History Social History   Tobacco Use  . Smoking status: Former Smoker    Packs/day: 0.00    Types: Cigarettes  . Smokeless tobacco: Never Used  . Tobacco comment: "quit smoking cigarettes in the 1960s; quit smoking cigars in the 1990s  Substance Use Topics  . Alcohol use: Yes    Comment: 2 DRINKS PER DAY  . Drug use: No     Allergies   Patient has no known allergies.   Review of Systems Review of Systems  Genitourinary: Positive for hematuria.  All other systems reviewed and are negative.    Physical Exam Updated Vital Signs BP (!) 159/75   Pulse 91   Resp 17   SpO2 100%   Physical Exam Vitals signs and nursing note reviewed.  Constitutional:      Appearance: He is well-developed.  HENT:     Head: Normocephalic and atraumatic.  Cardiovascular:     Rate and Rhythm: Normal rate and regular rhythm.     Heart sounds: No murmur.  Pulmonary:     Effort: Pulmonary effort is normal. No respiratory distress.     Breath sounds: Normal breath sounds.  Abdominal:     Palpations: Abdomen is soft.     Tenderness: There is no abdominal tenderness. There is no guarding or rebound.  Genitourinary:    Comments: 16 french foley catheter in place.  Grossly bloody urine in catheter bag with numerous clots.   Musculoskeletal:        General: No swelling or tenderness.  Skin:    General: Skin is warm and dry.  Neurological:     Mental Status: He is alert and oriented to person, place, and time.  Psychiatric:        Behavior: Behavior normal.      ED Treatments / Results  Labs (all labs ordered are listed, but only abnormal results are displayed) Labs Reviewed  URINALYSIS, ROUTINE W REFLEX MICROSCOPIC - Abnormal; Notable for the following components:      Result Value   Color, Urine RED (*)    APPearance TURBID (*)    Glucose, UA   (*)    Value: TEST NOT REPORTED DUE TO COLOR INTERFERENCE  OF URINE PIGMENT   Hgb urine dipstick   (*)    Value:  TEST NOT REPORTED DUE TO COLOR INTERFERENCE OF URINE PIGMENT   Bilirubin Urine   (*)    Value: TEST NOT REPORTED DUE TO COLOR INTERFERENCE OF URINE PIGMENT   Ketones, ur   (*)    Value: TEST NOT REPORTED DUE TO COLOR INTERFERENCE OF URINE PIGMENT   Protein, ur   (*)    Value: TEST NOT REPORTED DUE TO COLOR INTERFERENCE OF URINE PIGMENT   Nitrite   (*)    Value: TEST NOT REPORTED DUE TO COLOR INTERFERENCE OF URINE PIGMENT   Leukocytes, UA   (*)    Value: TEST NOT REPORTED DUE TO COLOR INTERFERENCE OF URINE PIGMENT   All other components within normal limits  BASIC METABOLIC PANEL - Abnormal; Notable for the following components:   Glucose, Bld 117 (*)    Calcium 8.6 (*)    All other components within normal limits  CBC WITH DIFFERENTIAL/PLATELET - Abnormal; Notable for the following components:   RBC 3.06 (*)    Hemoglobin 10.0 (*)    HCT 30.8 (*)    MCV 100.7 (*)    Platelets 94 (*)    Neutro Abs 9.4 (*)    Lymphs Abs 0.6 (*)    All other components within normal limits  URINALYSIS, MICROSCOPIC (REFLEX) - Abnormal; Notable for the following components:   Bacteria, UA MANY (*)    All other components within normal limits  URINE CULTURE    EKG None  Radiology No results found.  Procedures Procedures (including critical care time)  Medications Ordered in ED Medications - No data to display   Initial Impression / Assessment and Plan / ED Course  I have reviewed the triage vital signs and the nursing notes.  Pertinent labs & imaging results that were available during my care of the patient were reviewed by me and considered in my medical decision making (see chart for details).     Patient presents to the emergency department complaining of inability to urinate as well as hematuria. Foley catheter placed on ED arrival with gross hematuria present. Catheter was irrigated with 1 L without clearing of clots. Discussed with Dr. Junious Silk with urology, who saw the patient and  consult. Plan to perform continuous bladder irrigation and admit for further management.  Final Clinical Impressions(s) / ED Diagnoses   Final diagnoses:  None    ED Discharge Orders    None       Quintella Reichert, MD 12/22/18 2334

## 2018-12-22 NOTE — ED Notes (Signed)
Urology at bedside.

## 2018-12-22 NOTE — H&P (Signed)
Office Visit Report     12/12/2018   --------------------------------------------------------------------------------   Corey Oneill. Zerkle  MRN: [id number]  PRIMARY CARE:  [name], MD  DOB: [birth year], 83 year old Male  REFERRING:  Ammie Dalton, NP  SSN: [id number]  PROVIDER:  [name], M.D.    LOCATION:  [facility] [phone]   --------------------------------------------------------------------------------   Addendum:  Patient presented to emergency department today with recurrent gross hematuria and clot retention.  A 16 French Foley catheter was placed but it is grossly bloody and difficult to irrigate.  He also complained of some mucousy or watery bowel movement.  His hemoglobin was 10 and was 10.9 in the office a couple of weeks ago.  His creatinine is 0.85.  He otherwise looks well and is in NAD. Abd soft, NT.   Procedure: The 101 French Foley was removed he was prepped and draped in the usual sterile fashion.  A 24 French three-way Foley was passed.  I irrigated about 250 cc of clot and connected it to CBI.  CBI running light pink at a steady drip.  He is bleeding a little bit around the catheter and I wonder if his prostate is oozing.   Assessment/plan: #1 gross hematuria- this is likely from hemorrhagic cystitis or prostate bleeding.  CBI initiated and will consider cystoscopy possible TURP possible TURBT tomorrow.  #2 prostate cancer-On further review of his CT from December 09, 2018 there is possible sclerotic area in the left acetabular roof/iliac bone.  He has had some pain over there and I will get an x-ray.  A prescription for [place] [place] is pending and he is on Lupron.  #3 cardiac history-He has history of heart valve replacement.  I will have the medical doctors see him to consider transfusion.  History and Physical:  CC: I have prostate cancer.  HPI: Corey Oneill is a 83 year-old male established patient who is here evaluation for  treatment of prostate cancer.  His prostate cancer was diagnosed 01/22/2013. He does have the pathology report from his biopsy. His cancer was diagnosed by Intermediate risk.   He has undergone External Beam Radiation Therapy for treatment.   July 2014-completed IMRT without androgen deprivation. PSA rapid rise to Jun 2017 PSA 9.59.  -Last Staging - Jul 2019 - CT - no mets, stable mixed lucency lesion of the left iliac bone (negative on prior bone scan).   Started IADT Jul 2017. PSA 0.57 and rose to 23 Jun 2017. He returned and PSA up to 5.95 with a T of 337 over 6 months after a 4 month Lupron. His AUASS = 9. Lupron 30 mg/3 mo given Mar 2019. PSA down to 2.27 May 2018 with a T of 10. PSA went up to 2.63 and then 3.01 (with a T of <10) and an AUASS = 14. We continued Lupron 30 mg / Aug 2019. PSA is down to 2.6, but up to 3.54 with a T < 10. He had another Lupron 30 mg / 4 month Nov 12, 2018.   His PSA continues to rise now 5.55 with a T of <10. He's due for Lupron Mar 14, 2019.     CC: I have blood in my urine.  HPI: He did see the blood in his urine. He has seen blood clots.   He does not have a burning sensation when he urinates. He is not currently having trouble urinating.   His last U/S or CT Scan was 12/09/2018.  Goss hematuria in 2016 assoc with XRT necrosis on cysto and CT. F/u CT 2017 showed necrosis improved.   Gross hematuria Jul 2019. CT and cysto with left bladder neck stone. Hematuria recurred and cysto, bbx and fulguration with removal of the BN stone 07/30/2018. Path was benign. Post-op urgency and UUI. Constipation. PVR 0. He tried Veterinary surgeon.   He continues to have frequency, urgency and nocturia. he has to double voids. Bladder scan = 281 ml - he did not void prior. He tried myrbetriq and trospium. He continues to have urge incontinence. He waits to void sometimes. He can go two hours.   Hematuria recurred Jan 2020 and PVR 250's. A foley was placed for bladder rest. Cysto  was benign. He failed VT and foley replaced.   Foley removed 1/28. CT benign. His urine is clear and he is voiding. PVR 37 ml.     ALLERGIES: No Allergies    MEDICATIONS: Tamsulosin Hcl 0.4 mg capsule 1 capsule PO BID  Tamsulosin Hcl 0.4 mg capsule 1 capsule PO Q HS  Acetaminophen 500 mg tablet Oral PRN  Aspirin Ec 81 mg tablet, delayed release 0 Oral  Carvedilol 3.125 mg tablet  Duloxetine Hcl 30 mg capsule,delayed release  Isosorbide Mononitrate Er 30 mg tablet, extended release 24 hr 1/2 tablet PO Daily  Magnesium 400 mg magnesium capsule Oral  Nitroglycerin 0.4 MG Sublingual Tablet Sublingual Sublingual  Rosuvastatin Calcium 10 mg tablet Oral  Trospium Chloride Er 60 mg capsule, ext release 24 hr     GU PSH: Cysto Bladder Stone <2.5cm - 07/30/2018 Cystoscopy - 12/04/2018, 06/25/2018, 2017 Cystoscopy TURBT <2 cm - 07/30/2018 Locm 300-399Mg /Ml Iodine,1Ml - 12/09/2018, 06/05/2018, 2017    NON-GU PSH: Cardiac Stent Placement Heart Surgery (Unspecified) Nose Surgery (Unspecified) Remove Tonsils - 2014    GU PMH: Gross hematuria - 12/05/2018, - 12/04/2018, - 12/02/2018, - 11/29/2018, - 11/22/2018, - 11/20/2018, - 08/07/2018, - 07/05/2018, - 06/25/2018, - 05/30/2018, - 2017, Gross hematuria, - 2016 Prostate Cancer - 12/04/2018, - 12/02/2018, - 11/29/2018, - 11/22/2018, - 11/20/2018, - 10/29/2018, - 08/26/2018, - 07/05/2018, - 06/25/2018, - 05/30/2018, - 02/06/2018, - 07/17/2017, - 08/28/2016, - 2017, - 2017, Prostate cancer, - 2016 Urinary Retention, Unspec - 12/02/2018 Urinary Retention - 11/29/2018, - 11/22/2018 Urinary Urgency - 08/07/2018 ED due to arterial insufficiency - 07/05/2018 Nocturia - 02/06/2018 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      NON-GU  PMH: Encounter for general adult medical examination without abnormal findings, Encounter for preventive health examination - 2015 Personal history of other diseases of the digestive system, History of esophageal reflux - 2014 Personal history of other endocrine, nutritional and metabolic disease, History of hypercholesterolemia - 2014    FAMILY HISTORY: Brain Cancer - Father Death In The Family Father - Runs In Family Death In The Family Mother - Runs In Family non-Hodgkin's lymphoma - Mother   SOCIAL HISTORY: Marital Status: Married Preferred Language: English; Ethnicity: Not Hispanic Or Latino; Race: White Current Smoking Status: Patient does not smoke anymore.  Types of alcohol consumed: Beer, Liquor, Wine.  Does not drink caffeine. Patient's occupation Brewing technologist.     Notes: Former smoker, History of tobacco use, Alcohol Use, Occupation:, Marital History - Currently Married   REVIEW OF SYSTEMS:    GU  Review Male:   Patient reports hard to postpone urination and leakage of urine. Patient denies frequent urination, burning/ pain with urination, get up at night to urinate, stream starts and stops, trouble starting your stream, have to strain to urinate , erection problems, and penile pain.  Gastrointestinal (Upper):   Patient denies nausea, vomiting, and indigestion/ heartburn.  Gastrointestinal (Lower):   Patient denies diarrhea and constipation.  Constitutional:   Patient reports fatigue. Patient denies fever, night sweats, and weight loss.  Skin:   Patient denies skin rash/ lesion and itching.  Eyes:   Patient denies blurred vision and double vision.  Ears/ Nose/ Throat:   Patient denies sore throat and sinus problems.  Hematologic/Lymphatic:   Patient denies swollen glands and easy bruising.  Cardiovascular:   Patient denies leg swelling and chest pains.  Respiratory:   Patient denies cough and shortness of breath.  Endocrine:   Patient denies excessive thirst.   Musculoskeletal:   Patient denies back pain and joint pain.  Neurological:   Patient denies headaches and dizziness.  Psychologic:   Patient denies depression and anxiety.   VITAL SIGNS:      12/12/2018 02:40 PM  BP 122/71 mmHg  Pulse 91 /min  Temperature 97.4 F / 36.3 C   MULTI-SYSTEM PHYSICAL EXAMINATION:    Constitutional: Well-nourished. No physical deformities. Normally developed. Good grooming.  Neck: Neck symmetrical, not swollen. Normal tracheal position.  Respiratory: No labored breathing, no use of accessory muscles.   Cardiovascular: Normal temperature, normal extremity pulses, no swelling, no varicosities.  Skin: No paleness, no jaundice, no cyanosis. No lesion, no ulcer, no rash.  Neurologic / Psychiatric: Oriented to time, oriented to place, oriented to person. No depression, no anxiety, no agitation.  Gastrointestinal: No mass, no tenderness, no rigidity, non obese abdomen.     PAST DATA REVIEWED:  Source Of History:  Patient  X-Ray Review: C.T. Abdomen/Pelvis: Reviewed Films. 11/2018    12/04/18 10/08/18 08/20/18 07/05/18 06/25/18 05/13/18 01/28/18 08/24/16  PSA  Total PSA 5.55 ng/mL 3.54 ng/mL 2.60 ng/mL 3.01 ng/mL 2.63 ng/mL 2.15 ng/mL 5.95 ng/mL 0.57 ng/dl    12/04/18 10/08/18 07/05/18 05/13/18 01/28/18 08/24/16 01/23/15 07/29/14  Hormones  Testosterone, Total <10 ng/dL <10 ng/dL <10 ng/dL <10 ng/dL 337.7 ng/dL 9.3 pg/dL 194  223     PROCEDURES:         PVR Ultrasound - 51798  Scanned Volume: 37 cc         Urinalysis w/Scope Dipstick Dipstick Cont'd Micro  Color: Amber Bilirubin: Neg mg/dL WBC/hpf: 6 - 10/hpf  Appearance: Cloudy Ketones: Trace mg/dL RBC/hpf: >60/hpf  Specific Gravity: 1.020 Blood: 3+ ery/uL Bacteria: Rare (0-9/hpf)  pH: 6.0 Protein: 3+ mg/dL Cystals: NS (Not Seen)  Glucose: Neg mg/dL Urobilinogen: 0.2 mg/dL Casts: NS (Not Seen)    Nitrites: Neg Trichomonas: Not Present    Leukocyte Esterase: 2+ leu/uL Mucous: Not Present       Epithelial Cells: NS (Not Seen)      Yeast: NS (Not Seen)      Sperm: Not Present    Notes: Microscopic done on unconcentrated urine    ASSESSMENT:      ICD-10 Details  1 GU:   Prostate Cancer - C61   2   Gross hematuria - R31.0    PLAN:            Medications Refill Meds: Erleada 60 mg tablet 4 tablet PO Daily   #360  3 Refill(s)  Schedule Labs: 1 Month - PSA    1 Month - Total Testosterone  Return Visit/Planned Activity: 3 Months - Office Visit, Follow up MD             Note: f/u 3 mo, May 1 or after           Document Letter(s):  Created for Patient: Clinical Summary         Notes:   M0 CRPC - I discussed with the patient and his wife the nature r/b/a to darolutamide but it interacts with statins. Therefore discussed apalutamide. We discussed CV issues, DVT/PE and neutropenia as well as fatigue and extremity pain among others. He will begin.   cc: Dr. Redmond School         Next Appointment:      Next Appointment: 01/07/2019 08:15 AM    Appointment Type: Laboratory Appointment    Location: [facility] [phone]    Provider: Lab LAB    Reason for Visit: psa/total test-esk      * Signed by [name], M.D. on 12/13/18 at 2:38 PM (EST)*     The information contained in this medical record document is considered private and confidential patient information. This information can only be used for the medical diagnosis and/or medical services that are being provided by the patient's selected caregivers. This information can only be distributed outside of the patient's care if the patient agrees and signs waivers of authorization for this information to be sent to an outside source or route.  Addendum:  Patient presented to emergency department today with recurrent gross hematuria and clot retention.  A 16 French Foley catheter was placed but it is grossly bloody and difficult to irrigate.  He also complained of some mucousy or watery bowel  movement.  His hemoglobin was 10 and was 10.9 in the office a couple of weeks ago.  His creatinine is 0.85.  Procedure: The 33 French Foley was removed he was prepped and draped in the usual sterile fashion.  A 24 French three-way Foley was passed.  I irrigated about 250 cc of clot and connected it to CBI.  CBI running light pink at a steady drip.  He is bleeding a little bit around the catheter and I wonder if his prostate is oozing.  On further review of his CT from December 09, 2018 there is possible sclerotic area in the left acetabular roof/iliac bone.  He has had some pain over there and I will get an x-ray.  He has history of heart valve replacement.  I will have the medical doctors see him to consider transfusion.

## 2018-12-22 NOTE — ED Notes (Signed)
Bed: WLPT4 Expected date:  Expected time:  Means of arrival:  Comments: 

## 2018-12-22 NOTE — Consult Note (Signed)
Medical Consultation   Corey Oneill  WFU:[id number]  DOB: [birth year]  DOA: 12/22/2018  PCP: [name], MD Outpatient Specialists: cardiology Ezzie Dural); oncology Alen Blew) Requesting physician: Junious Silk (urology) Reason for consultation: assess need for transfusion and review medications  Impression/Recommendations  anemia with Hb at 10 decreased from baseline 13  stable thrombocytopenia  hx of TAVR and CAD (stable)  hx of dysthymia/depression   83 y.o. man with hx of prostate CA, CAD s/p PCI to RCA, AS s/p TAVR (2017), hx of ITP and thrombocytopenia who presented with recurrent hematuria in setting of prostate malignancy. [facility] was consulted regarding medication optimization given his cardiac history and depression. Patient denies any cardiac symptoms to me and on review of his most recent cardiology visit, appears to have been doing well. Recent stress echo suggest well-functioning bioprosthetic aortic valve and no new ischemia. Of note, he is not on anticoagulation given recurrent thrombocytopenia (diagnosed with ITP) but his platelet count has actually somewhat improved today. Depression medications are stable and actually he is in the middle of a taper/combination started by his PCP. Since Hb 10 and he has no anginal or CHF symptoms and reassuring recent cardiac workup, no need for urgent transfusion overnight.    # Acute anemia Hb 10 likely due to hematuria. No evidence of angina or ischemia.  > Hb baseline 13-14  - no transfusion at this time  - transfuse goal Hb > 7  - repeat CBC in AM  # Stable thrombocytopenia with hx of ITP > previous plt count 40K  - monitor daily CBC, no need for platelets  # Hx of CAD s/p RCA stent and TAVR > no cardiac sx; reassuring echo stress in 09/2018  - resume home coreg 3.125mg  BID  - resume imdur 30mg  daily  - no aspirin or other antiplatelets as per his outpatient cardiology given ITP hx  - may resume  crestor  # Depression/hx of dysthymia > PCP recent decreased cymbalta to 30mg  from 60mg  and started trintellix  - continue cymbalta at 30mg  daily (this is the taper dose)  - resume trintellex 5mg  daily  # Hx of prostate cancer and hematuria with foley catheter placed in ED  - continue home  alfuzosin and tamsulosin as per urology  Thank you for this consultation.  We will follow the patient along with you.  ___________________________________________________________________________________  History of Present Illness: Corey Oneill is an 83 y.o. male with hx of prostate CA, CAD s/p PCI to RCA, AS s/p TAVR (2017), hx of chronic thrombocytopenia who presented to [facility] ED with hematuria and difficulty urinating. This has been a recurrent issue. For past 3 days, patient has had trouble urinating and has noticed bloody output, including small clots). Now having lower abdominal pressure symptoms as well as pain in his penis. Foley catheter was placed in ED where > 500cc of bloody urine output was drained. Patient reports feeling relief after catheter placement.  He denies exertional chest pain, dyspnea, lower extremity edema, PND or orthopnea, or syncope. Followed by outpatient cardiology and hematology (see above Recommendations regarding prior workup).   Review of Systems:  Review of Systems  Constitutional: Negative.   HENT: Negative.   Eyes: Negative.   Respiratory: Negative.   Cardiovascular: Negative.   Gastrointestinal: Positive for abdominal pain.  Genitourinary: Positive for dysuria, flank pain, hematuria and urgency.  Skin: Negative.   Neurological: Negative.  Psychiatric/Behavioral: Positive for depression.    All other systems reviewed and apart from HPI, all others are negative.  Past Medical History: Past Medical History:  Diagnosis Date  . Abdominal aortic atherosclerosis ([place])   . Anginal pain ([place])   . BPPV (benign paroxysmal positional vertigo)   . CAD (coronary  artery disease)    a.  LHC 8/16: Mid to distal LAD 30%, OM1 40%, proximal mid RCA 40%, distal RCA 60% >> FFR 0.69  >> PCI: 3 x 15 mm Resolute DES  . Depression   . Esophageal reflux   . Family history of adverse reaction to anesthesia    "daughter has PONV"  . Grade I diastolic dysfunction 16/08/9603   Noted on ECHO   . H/O blood clots    "get them in my stool and urine" (11/12/2015)  . Heart murmur   . Hepatic lesion 12/08/2015   Stable 8 mm right hepatic lesion  . History of aortic stenosis    a. peak to peak gradient by LHC 8/16:  37 mmHg (moderately severe)   . History of appendicitis 2016  . History of herpes labialis   . History of ITP    2018, thrombocytopenia  . History of kidney stones   . Hx of radiation therapy 04/14/13-06/09/13   prostate 7800 cGy, 40 sessions, seminal vesicles 5600 cGy 40 sessions  . Hydrocele 2006   Small  . Hyperlipidemia   . Insomnia    resolved  . Malignant melanoma in situ ([place]) 09/04/2016   Right neck and chest  . Prostate cancer ([place]) dx'd 2014  . Radiation proctitis   . Renal mass 2017   Bilateral renal masses  . Wears hearing aid in both ears     Past Surgical History: Past Surgical History:  Procedure Laterality Date  . CARDIAC CATHETERIZATION N/A 04/21/2015   Procedure: Right/Left Heart Cath and Coronary Angiography;  Surgeon: [name], MD;  Location: [place] [facility];  Service: Cardiovascular;  Laterality: N/A;  . CARDIAC CATHETERIZATION N/A 06/18/2015   Procedure: Intravascular Pressure Wire/FFR Study;  Surgeon: [name], MD;  Location: [place] [facility];  Service: Cardiovascular;  Laterality: N/A;  . CARDIAC CATHETERIZATION N/A 06/18/2015   Procedure: Coronary Stent Intervention;  Surgeon: [name], MD;  Location: [place] [facility];  Service: Cardiovascular;  Laterality: N/A;  . CARDIAC CATHETERIZATION N/A 06/18/2015   Procedure: Right/Left Heart Cath and Coronary Angiography;  Surgeon: [name], MD;  Location: [place] [facility];  Service: Cardiovascular;  Laterality: N/A;  . CARDIAC CATHETERIZATION N/A 06/23/2015   Procedure: Left Heart Cath and Cors/Grafts Angiography;  Surgeon: [name], MD;  Location: [place] [facility];  Service: Cardiovascular;  Laterality: N/A;  . CARDIAC CATHETERIZATION N/A 01/17/2016   Procedure: Right/Left Heart Cath and Coronary Angiography;  Surgeon: [name], MD;  Location: [place] [facility];  Service: Cardiovascular;  Laterality: N/A;  . CATARACT EXTRACTION, BILATERAL    . COLONOSCOPY  06/26/2017  . CYSTOSCOPY WITH BIOPSY N/A 07/30/2018   Procedure: CYSTOSCOPY WITH BIOPSY/FULGURATION, CYSTOLTHOLAPAXY;  Surgeon: [name], MD;  Location: [facility];  Service: Urology;  Laterality: N/A;  . FRACTURE SURGERY    . INGUINAL HERNIA REPAIR     patient does not remember this procedure  . LAPAROSCOPIC APPENDECTOMY N/A 11/16/2015   Procedure: APPENDECTOMY LAPAROSCOPIC;  Surgeon: [name], MD;  Location: [place];  Service: General;  Laterality: N/A;  . LEFT HEART CATH AND CORONARY ANGIOGRAPHY  N/A 12/26/2016   Procedure: Left Heart Cath and Coronary Angiography;  Surgeon: Troy Sine, MD;  Location: [place] [facility];  Service: Cardiovascular;  Laterality: N/A;  . MELANOMA EXCISION Right 10/24/2016   Procedure: WIDE EXCISION MELANOMA RIGHT NECK AND RIGHT CHEST TIMES 2;  Surgeon: [name], MD;  Location: [place];  Service: General;  Laterality: Right;  right medial and lateral lesion of chest and right neck  . NASAL FRACTURE SURGERY     "broken years ago; several ORs to correct it"  . NASAL SEPTUM SURGERY    . PROSTATE BIOPSY  2014   "needle biopsy"  . TEE WITHOUT CARDIOVERSION N/A 02/15/2016   Procedure: TRANSESOPHAGEAL ECHOCARDIOGRAM (TEE);  Surgeon: [name], MD;  Location: [place];  Service: Open Heart Surgery;  Laterality: N/A;  . TONSILLECTOMY    . TRANSCATHETER AORTIC VALVE REPLACEMENT, TRANSFEMORAL  02/15/2016    . TRANSCATHETER AORTIC VALVE REPLACEMENT, TRANSFEMORAL N/A 02/15/2016   Procedure: TRANSCATHETER AORTIC VALVE REPLACEMENT, TRANSFEMORAL;  Surgeon: [name], MD;  Location: [place];  Service: Open Heart Surgery;  Laterality: N/A;     Allergies:  No Known Allergies   Social History:  reports that he has quit smoking. His smoking use included cigarettes. He smoked 0.00 packs per day. He has never used smokeless tobacco. He reports current alcohol use. He reports that he does not use drugs.   Family History: Family History  Problem Relation Age of Onset  . Brain cancer Father   . Lymphoma Mother   . Depression Daughter       Physical Exam: Vitals:   12/22/18 1255 12/22/18 1833 12/22/18 1834 12/22/18 2045  BP: 137/78 (!) 160/85  (!) 159/75  Pulse: 86 92 92 91  Resp: 15 18  17   SpO2: 100% 100% 100% 100%    Constitutional: Alert and awake, oriented x3, not in any acute distress. Eyes: PERLA, EOMI, irises appear normal, anicteric sclera,  ENMT: external ears and nose appear normal, hearing normal, Lips appears normal, oropharynx mucosa, tongue, posterior pharynx appear normal  Neck: neck appears normal, no masses, normal ROM, no thyromegaly, no JVD  CVS: S1-S2 clear, RRR, no murmur rubs or gallops, no LE edema, normal pedal pulses  Respiratory:  clear to auscultation bilaterally, no wheezing, rales or rhonchi. Respiratory effort normal. No accessory muscle use.  Abdomen: soft nontender, nondistended, normal bowel sounds, no hepatosplenomegaly, no hernias  Foley catheter in placed Musculoskeletal: : no cyanosis, clubbing or edema noted bilaterally.  Neuro: intact motor 5/5 upper and lower extremities Psych: judgement and insight appear normal, stable mood and affect, mental status Skin: no rashes or lesions or ulcers, no induration or nodules    Data reviewed:  I have personally reviewed following labs and imaging studies Labs:  CBC: Recent Labs  Lab 12/22/18 1713  WBC  10.2  NEUTROABS 9.4*  HGB 10.0*  HCT 30.8*  MCV 100.7*  PLT 94*    Basic Metabolic Panel: Recent Labs  Lab 12/22/18 1713  NA 136  K 4.4  CL 102  CO2 26  GLUCOSE 117*  BUN 16  CREATININE 0.85  CALCIUM 8.6*   GFR Estimated Creatinine Clearance: 68.7 mL/min (by C-G formula based on SCr of 0.85 mg/dL). Liver Function Tests: No results for input(s): AST, ALT, ALKPHOS, BILITOT, PROT, ALBUMIN in the last 168 hours. No results for input(s): LIPASE, AMYLASE in the last 168 hours. No results for input(s): AMMONIA in the last 168 hours. Coagulation profile No results for  input(s): INR, PROTIME in the last 168 hours.  Cardiac Enzymes: No results for input(s): CKTOTAL, CKMB, CKMBINDEX, TROPONINI in the last 168 hours. BNP: Invalid input(s): POCBNP CBG: No results for input(s): GLUCAP in the last 168 hours. D-Dimer No results for input(s): DDIMER in the last 72 hours. Hgb A1c No results for input(s): HGBA1C in the last 72 hours. Lipid Profile No results for input(s): CHOL, HDL, LDLCALC, TRIG, CHOLHDL, LDLDIRECT in the last 72 hours. Thyroid function studies No results for input(s): TSH, T4TOTAL, T3FREE, THYROIDAB in the last 72 hours.  Invalid input(s): FREET3 Anemia work up No results for input(s): VITAMINB12, FOLATE, FERRITIN, TIBC, IRON, RETICCTPCT in the last 72 hours. Urinalysis    Component Value Date/Time   COLORURINE RED (A) 12/22/2018 1257   APPEARANCEUR TURBID (A) 12/22/2018 1257   LABSPEC  12/22/2018 1257    TEST NOT REPORTED DUE TO COLOR INTERFERENCE OF URINE PIGMENT   PHURINE  12/22/2018 1257    TEST NOT REPORTED DUE TO COLOR INTERFERENCE OF URINE PIGMENT   GLUCOSEU (A) 12/22/2018 1257    TEST NOT REPORTED DUE TO COLOR INTERFERENCE OF URINE PIGMENT   HGBUR (A) 12/22/2018 1257    TEST NOT REPORTED DUE TO COLOR INTERFERENCE OF URINE PIGMENT   BILIRUBINUR (A) 12/22/2018 1257    TEST NOT REPORTED DUE TO COLOR INTERFERENCE OF URINE PIGMENT   BILIRUBINUR n  10/15/2015 0939   KETONESUR (A) 12/22/2018 1257    TEST NOT REPORTED DUE TO COLOR INTERFERENCE OF URINE PIGMENT   PROTEINUR (A) 12/22/2018 1257    TEST NOT REPORTED DUE TO COLOR INTERFERENCE OF URINE PIGMENT   UROBILINOGEN 0.2 10/15/2015 0939   UROBILINOGEN 0.2 06/23/2015 1443   NITRITE (A) 12/22/2018 1257    TEST NOT REPORTED DUE TO COLOR INTERFERENCE OF URINE PIGMENT   LEUKOCYTESUR (A) 12/22/2018 1257    TEST NOT REPORTED DUE TO COLOR INTERFERENCE OF URINE PIGMENT    Sepsis Labs Invalid input(s): PROCALCITONIN,  WBC,  LACTICIDVEN Microbiology No results found for this or any previous visit (from the past 240 hour(s)).     Inpatient Medications:   Scheduled Meds: Continuous Infusions:   Radiological Exams on Admission: No results found.     Time Spent:  > 35 mins  Colbert Ewing M.D. [facility] 12/22/2018, 11:33 PM  To contact the [facility] consulting provider between 7A-7P or the covering provider during after hours 7P-7A, please log into the web site [email or web address] and access using universal [place] password for that web site. If you do not have the password, please call the hospital operator.

## 2018-12-23 ENCOUNTER — Observation Stay (HOSPITAL_COMMUNITY): Payer: Medicare Other | Admitting: Certified Registered Nurse Anesthetist

## 2018-12-23 ENCOUNTER — Other Ambulatory Visit: Payer: Self-pay

## 2018-12-23 ENCOUNTER — Other Ambulatory Visit: Payer: Self-pay | Admitting: Urology

## 2018-12-23 ENCOUNTER — Encounter (HOSPITAL_COMMUNITY): Payer: Self-pay | Admitting: *Deleted

## 2018-12-23 ENCOUNTER — Encounter (HOSPITAL_COMMUNITY)
Admission: EM | Disposition: A | Discharge status: Home / Self Care | Payer: Self-pay | Source: Home / Self Care | Attending: Urology

## 2018-12-23 DIAGNOSIS — I25119 Atherosclerotic heart disease of native coronary artery with unspecified angina pectoris: Secondary | ICD-10-CM | POA: Diagnosis not present

## 2018-12-23 DIAGNOSIS — R310 Gross hematuria: Secondary | ICD-10-CM | POA: Diagnosis not present

## 2018-12-23 DIAGNOSIS — F329 Major depressive disorder, single episode, unspecified: Secondary | ICD-10-CM | POA: Diagnosis present

## 2018-12-23 DIAGNOSIS — D62 Acute posthemorrhagic anemia: Secondary | ICD-10-CM

## 2018-12-23 DIAGNOSIS — I9589 Other hypotension: Secondary | ICD-10-CM | POA: Diagnosis not present

## 2018-12-23 DIAGNOSIS — D649 Anemia, unspecified: Secondary | ICD-10-CM | POA: Diagnosis not present

## 2018-12-23 DIAGNOSIS — Z818 Family history of other mental and behavioral disorders: Secondary | ICD-10-CM | POA: Diagnosis not present

## 2018-12-23 DIAGNOSIS — I2510 Atherosclerotic heart disease of native coronary artery without angina pectoris: Secondary | ICD-10-CM

## 2018-12-23 DIAGNOSIS — I700 Atherosclerosis of aorta: Secondary | ICD-10-CM | POA: Diagnosis present

## 2018-12-23 DIAGNOSIS — N419 Inflammatory disease of prostate, unspecified: Secondary | ICD-10-CM | POA: Diagnosis not present

## 2018-12-23 DIAGNOSIS — Z952 Presence of prosthetic heart valve: Secondary | ICD-10-CM | POA: Diagnosis not present

## 2018-12-23 DIAGNOSIS — K5900 Constipation, unspecified: Secondary | ICD-10-CM | POA: Diagnosis not present

## 2018-12-23 DIAGNOSIS — N3011 Interstitial cystitis (chronic) with hematuria: Secondary | ICD-10-CM | POA: Diagnosis not present

## 2018-12-23 DIAGNOSIS — Z79899 Other long term (current) drug therapy: Secondary | ICD-10-CM | POA: Diagnosis not present

## 2018-12-23 DIAGNOSIS — D696 Thrombocytopenia, unspecified: Secondary | ICD-10-CM | POA: Diagnosis present

## 2018-12-23 DIAGNOSIS — Z807 Family history of other malignant neoplasms of lymphoid, hematopoietic and related tissues: Secondary | ICD-10-CM | POA: Diagnosis not present

## 2018-12-23 DIAGNOSIS — N3041 Irradiation cystitis with hematuria: Secondary | ICD-10-CM | POA: Diagnosis present

## 2018-12-23 DIAGNOSIS — N3289 Other specified disorders of bladder: Secondary | ICD-10-CM | POA: Diagnosis not present

## 2018-12-23 DIAGNOSIS — Z808 Family history of malignant neoplasm of other organs or systems: Secondary | ICD-10-CM | POA: Diagnosis not present

## 2018-12-23 DIAGNOSIS — Z974 Presence of external hearing-aid: Secondary | ICD-10-CM | POA: Diagnosis not present

## 2018-12-23 DIAGNOSIS — Z85820 Personal history of malignant melanoma of skin: Secondary | ICD-10-CM | POA: Diagnosis not present

## 2018-12-23 DIAGNOSIS — Z7982 Long term (current) use of aspirin: Secondary | ICD-10-CM | POA: Diagnosis not present

## 2018-12-23 DIAGNOSIS — Z955 Presence of coronary angioplasty implant and graft: Secondary | ICD-10-CM | POA: Diagnosis not present

## 2018-12-23 DIAGNOSIS — K219 Gastro-esophageal reflux disease without esophagitis: Secondary | ICD-10-CM | POA: Diagnosis present

## 2018-12-23 DIAGNOSIS — D500 Iron deficiency anemia secondary to blood loss (chronic): Secondary | ICD-10-CM | POA: Diagnosis not present

## 2018-12-23 DIAGNOSIS — Z87442 Personal history of urinary calculi: Secondary | ICD-10-CM | POA: Diagnosis not present

## 2018-12-23 DIAGNOSIS — C61 Malignant neoplasm of prostate: Secondary | ICD-10-CM | POA: Diagnosis present

## 2018-12-23 DIAGNOSIS — N3941 Urge incontinence: Secondary | ICD-10-CM | POA: Diagnosis not present

## 2018-12-23 DIAGNOSIS — M25552 Pain in left hip: Secondary | ICD-10-CM | POA: Diagnosis not present

## 2018-12-23 DIAGNOSIS — Z923 Personal history of irradiation: Secondary | ICD-10-CM | POA: Diagnosis not present

## 2018-12-23 DIAGNOSIS — N3091 Cystitis, unspecified with hematuria: Secondary | ICD-10-CM | POA: Diagnosis not present

## 2018-12-23 DIAGNOSIS — Z87891 Personal history of nicotine dependence: Secondary | ICD-10-CM | POA: Diagnosis not present

## 2018-12-23 DIAGNOSIS — E785 Hyperlipidemia, unspecified: Secondary | ICD-10-CM | POA: Diagnosis not present

## 2018-12-23 HISTORY — PX: TRANSURETHRAL RESECTION OF PROSTATE: SHX73

## 2018-12-23 LAB — HEMOGLOBIN AND HEMATOCRIT, BLOOD
HCT: 28.9 % — ABNORMAL LOW (ref 39.0–52.0)
HCT: 25.2 % — ABNORMAL LOW (ref 39.0–52.0)
HEMOGLOBIN: 9.3 g/dL — AB (ref 13.0–17.0)
Hemoglobin: 8.1 g/dL — ABNORMAL LOW (ref 13.0–17.0)

## 2018-12-23 LAB — BASIC METABOLIC PANEL
Anion gap: 8 (ref 5–15)
BUN: 14 mg/dL (ref 8–23)
CO2: 26 mmol/L (ref 22–32)
Calcium: 8.5 mg/dL — ABNORMAL LOW (ref 8.9–10.3)
Chloride: 104 mmol/L (ref 98–111)
Creatinine, Ser: 0.8 mg/dL (ref 0.61–1.24)
GFR calc Af Amer: >60 mL/min (ref >60)
GFR calc non Af Amer: >60 mL/min (ref >60)
Glucose, Bld: 129 mg/dL — ABNORMAL HIGH (ref 70–99)
POTASSIUM: 3.3 mmol/L — AB (ref 3.5–5.1)
Sodium: 138 mmol/L (ref 135–145)

## 2018-12-23 LAB — PREPARE RBC (CROSSMATCH)

## 2018-12-23 LAB — ABO/RH: ABO/RH(D): O POS

## 2018-12-23 LAB — SURGICAL PCR SCREEN
MRSA, PCR: NEGATIVE
Staphylococcus aureus: NEGATIVE

## 2018-12-23 SURGERY — TURP (TRANSURETHRAL RESECTION OF PROSTATE)
Anesthesia: General | Site: Bladder

## 2018-12-23 MED ORDER — POTASSIUM CHLORIDE 10 MEQ/100ML IV SOLN
10.0000 meq | INTRAVENOUS | Status: AC
  Administered 2018-12-23 (×4): 10 meq via INTRAVENOUS
  Filled 2018-12-23 (×4): qty 100

## 2018-12-23 MED ORDER — TRAMADOL HCL 50 MG PO TABS
50.0000 mg | ORAL_TABLET | Freq: Once | ORAL | Status: AC
  Administered 2018-12-23: 50 mg via ORAL
  Filled 2018-12-23: qty 1

## 2018-12-23 MED ORDER — SODIUM CHLORIDE 0.9 % IR SOLN
Status: DC | PRN
  Administered 2018-12-23: 3000 mL via INTRAVESICAL

## 2018-12-23 MED ORDER — ONDANSETRON HCL 4 MG/2ML IJ SOLN
INTRAMUSCULAR | Status: AC
  Filled 2018-12-23: qty 2

## 2018-12-23 MED ORDER — PHENYLEPHRINE 40 MCG/ML (10ML) SYRINGE FOR IV PUSH (FOR BLOOD PRESSURE SUPPORT)
PREFILLED_SYRINGE | INTRAVENOUS | Status: AC
  Filled 2018-12-23: qty 10

## 2018-12-23 MED ORDER — SODIUM CHLORIDE 0.9% IV SOLUTION
Freq: Once | INTRAVENOUS | Status: AC
  Administered 2018-12-23: 23:00:00 via INTRAVENOUS

## 2018-12-23 MED ORDER — LIDOCAINE 2% (20 MG/ML) 5 ML SYRINGE
INTRAMUSCULAR | Status: AC
  Filled 2018-12-23: qty 5

## 2018-12-23 MED ORDER — CEPHALEXIN 500 MG PO CAPS: 500.0000 mg | ORAL_CAPSULE | Freq: Every day | ORAL | 0 refills | Status: AC

## 2018-12-23 MED ORDER — SODIUM CHLORIDE 0.9 % IR SOLN
Status: DC | PRN
  Administered 2018-12-23: 500 mL via INTRAVESICAL

## 2018-12-23 MED ORDER — CEFAZOLIN SODIUM-DEXTROSE 2-4 GM/100ML-% IV SOLN
2.0000 g | Freq: Once | INTRAVENOUS | Status: AC
  Administered 2018-12-23: 2 g via INTRAVENOUS
  Filled 2018-12-23: qty 100

## 2018-12-23 MED ORDER — FENTANYL CITRATE (PF) 100 MCG/2ML IJ SOLN
INTRAMUSCULAR | Status: DC | PRN
  Administered 2018-12-23: 50 ug via INTRAVENOUS

## 2018-12-23 MED ORDER — PROPOFOL 10 MG/ML IV BOLUS
INTRAVENOUS | Status: DC | PRN
  Administered 2018-12-23: 100 mg via INTRAVENOUS

## 2018-12-23 MED ORDER — PROMETHAZINE HCL 25 MG/ML IJ SOLN: 6.2500 mg | INTRAMUSCULAR | Status: DC | PRN

## 2018-12-23 MED ORDER — FENTANYL CITRATE (PF) 100 MCG/2ML IJ SOLN: 25.0000 ug | INTRAMUSCULAR | Status: DC | PRN

## 2018-12-23 MED ORDER — EPHEDRINE SULFATE-NACL 50-0.9 MG/10ML-% IV SOSY
PREFILLED_SYRINGE | INTRAVENOUS | Status: DC | PRN
  Administered 2018-12-23: 10 mg via INTRAVENOUS
  Administered 2018-12-23: 15 mg via INTRAVENOUS
  Administered 2018-12-23 (×2): 10 mg via INTRAVENOUS

## 2018-12-23 MED ORDER — FENTANYL CITRATE (PF) 100 MCG/2ML IJ SOLN
INTRAMUSCULAR | Status: AC
  Filled 2018-12-23: qty 2

## 2018-12-23 MED ORDER — DEXAMETHASONE SODIUM PHOSPHATE 10 MG/ML IJ SOLN
INTRAMUSCULAR | Status: DC | PRN
  Administered 2018-12-23: 4 mg via INTRAVENOUS

## 2018-12-23 MED ORDER — PHENYLEPHRINE 40 MCG/ML (10ML) SYRINGE FOR IV PUSH (FOR BLOOD PRESSURE SUPPORT)
PREFILLED_SYRINGE | INTRAVENOUS | Status: DC | PRN
  Administered 2018-12-23: 120 ug via INTRAVENOUS
  Administered 2018-12-23 (×4): 80 ug via INTRAVENOUS
  Administered 2018-12-23 (×3): 120 ug via INTRAVENOUS

## 2018-12-23 MED ORDER — PROPOFOL 10 MG/ML IV BOLUS
INTRAVENOUS | Status: AC
  Filled 2018-12-23: qty 20

## 2018-12-23 MED ORDER — TRAMADOL HCL 50 MG PO TABS
50.0000 mg | ORAL_TABLET | Freq: Three times a day (TID) | ORAL | Status: DC | PRN
  Administered 2018-12-23: 50 mg via ORAL
  Filled 2018-12-23: qty 1

## 2018-12-23 MED ORDER — DEXAMETHASONE SODIUM PHOSPHATE 10 MG/ML IJ SOLN
INTRAMUSCULAR | Status: AC
  Filled 2018-12-23: qty 1

## 2018-12-23 MED ORDER — LACTATED RINGERS IV SOLN
INTRAVENOUS | Status: DC
  Administered 2018-12-23 (×2): via INTRAVENOUS

## 2018-12-23 MED ORDER — MEPERIDINE HCL 50 MG/ML IJ SOLN: 6.2500 mg | INTRAMUSCULAR | Status: DC | PRN

## 2018-12-23 MED ORDER — LIDOCAINE HCL (CARDIAC) PF 50 MG/5ML IV SOSY
PREFILLED_SYRINGE | INTRAVENOUS | Status: DC | PRN
  Administered 2018-12-23: 60 mg via INTRAVENOUS

## 2018-12-23 MED ORDER — EPHEDRINE 5 MG/ML INJ
INTRAVENOUS | Status: AC
  Filled 2018-12-23: qty 10

## 2018-12-23 SURGICAL SUPPLY — 21 items
BAG URINE DRAINAGE (UROLOGICAL SUPPLIES) ×3 IMPLANT
BAG URO CATCHER STRL LF (MISCELLANEOUS) ×3 IMPLANT
COVER WAND RF STERILE (DRAPES) IMPLANT
ELECT REM PT RETURN 15FT ADLT (MISCELLANEOUS) ×3 IMPLANT
EVACUATOR MICROVAS BLADDER (UROLOGICAL SUPPLIES) ×1 IMPLANT
GLOVE BIOGEL M STRL SZ7.5 (GLOVE) ×5 IMPLANT
GLOVE BIOGEL PI IND STRL 6.5 (GLOVE) IMPLANT
GLOVE BIOGEL PI INDICATOR 6.5 (GLOVE) ×2
GOWN STRL REUS W/TWL XL LVL3 (GOWN DISPOSABLE) ×5 IMPLANT
HOLDER FOLEY CATH W/STRAP (MISCELLANEOUS) IMPLANT
IV NS IRRIG 3000ML ARTHROMATIC (IV SOLUTION) ×2 IMPLANT
IV SODIUM CHL 0.9% 500ML (IV SOLUTION) ×2 IMPLANT
LOOP CUT BIPOLAR 24F LRG (ELECTROSURGICAL) ×2 IMPLANT
MANIFOLD NEPTUNE II (INSTRUMENTS) ×3 IMPLANT
NS IRRIG 1000ML POUR BTL (IV SOLUTION) ×2 IMPLANT
PACK CYSTO (CUSTOM PROCEDURE TRAY) ×3 IMPLANT
SYR 30ML LL (SYRINGE) ×2 IMPLANT
SYRINGE IRR TOOMEY STRL 70CC (SYRINGE) ×3 IMPLANT
TUBING CONNECTING 10 (TUBING) ×2 IMPLANT
TUBING CONNECTING 10' (TUBING) ×1
TUBING UROLOGY SET (TUBING) ×3 IMPLANT

## 2018-12-23 NOTE — Progress Notes (Signed)
[facility] PROGRESS NOTE  Corey Oneill EAV:[id number] DOB: 11-Jan-1936 DOA: 12/22/2018  PCP: [name], MD  Brief History/Interval Summary: 83 y.o. man with hx of prostate CA, CAD s/p PCI to RCA, AS s/p TAVR (2017), hx of ITP and thrombocytopenia who presented with recurrent hematuria in setting of prostate malignancy.  This has been an issue for the past few weeks.  Patient has been following up with his urologist.  He was subsequently hospitalized.  Tried hospitalist was consulted to assist with management of his medical issues.    Procedures: None yet.  Urological procedure is planned for later today  Antibiotics: None  Subjective/Interval History: Patient complains of pain in his bladder which he blames on the catheter.  He denies any nausea vomiting.  No shortness of breath.  ROS: Denies any chest pain or headaches  Objective:  Vital Signs  Vitals:   12/22/18 2348 12/23/18 0033 12/23/18 0043 12/23/18 0406  BP: (!) 191/114 121/78  108/62  Pulse: (!) 101 87  77  Resp: 16 16  16   Temp:  98.6 F (37 C)  98.1 F (36.7 C)  TempSrc:  Oral  Oral  SpO2: 100% 100%  100%  Weight:   70.1 kg   Height:   5\' 10"  (1.778 m)     Intake/Output Summary (Last 24 hours) at 12/23/2018 1059 Last data filed at 12/23/2018 1037 Gross per 24 hour  Intake 15634.17 ml  Output 26600 ml  Net -10965.83 ml   Filed Weights   12/23/18 0043  Weight: 70.1 kg    General appearance: alert, cooperative, appears stated age and no distress Head: Normocephalic, without obvious abnormality, atraumatic Resp: clear to auscultation bilaterally Cardio: regular rate and rhythm, S1, S2 normal, no murmur, click, rub or gallop GI: soft, non-tender; bowel sounds normal; no masses,  no organomegaly Extremities: extremities normal, atraumatic, no cyanosis or edema Neurologic: No focal neurological deficits.  Lab Results:  Data Reviewed: I have personally reviewed following labs and imaging  studies  CBC: Recent Labs  Lab 12/22/18 1713 12/23/18 0409  WBC 10.2  --   NEUTROABS 9.4*  --   HGB 10.0* 9.3*  HCT 30.8* 28.9*  MCV 100.7*  --   PLT 94*  --     Basic Metabolic Panel: Recent Labs  Lab 12/22/18 1713 12/23/18 0409  NA 136 138  K 4.4 3.3*  CL 102 104  CO2 26 26  GLUCOSE 117* 129*  BUN 16 14  CREATININE 0.85 0.80  CALCIUM 8.6* 8.5*    GFR: Estimated Creatinine Clearance: 73 mL/min (by C-G formula based on SCr of 0.8 mg/dL).   Radiology Studies: No results found.   Medications:  Scheduled: . carvedilol  3.125 mg Oral BID  . docusate sodium  100 mg Oral BID  . DULoxetine  30 mg Oral Daily  . isosorbide mononitrate  30 mg Oral Daily  . rosuvastatin  10 mg Oral q1800  . senna  1 tablet Oral BID  . tamsulosin  0.4 mg Oral BID  . [START ON 01/09/2019] vortioxetine HBr  5 mg Oral Daily   Continuous: . sodium chloride Stopped (12/23/18 0109)  .  ceFAZolin (ANCEF) IV    . potassium chloride 10 mEq (12/23/18 1045)  . sodium chloride irrigation     NWG:[id number], diphenhydrAMINE **OR** diphenhydrAMINE, hyoscyamine, neomycin-bacitracin-polymyxin, nitroGLYCERIN, ondansetron, traMADol    Assessment/Plan:  Anemia due to acute blood loss Baseline hemoglobin between 12 and 13.  Noted to be 10.0 when he  was hospitalized yesterday evening.  9.3 this morning.  Continue to monitor for now.  Will transfuse if it drops below 8.  Stable thrombocytopenia Patient has a history of ITP.  Platelet counts usually run in the 40s and 50s.  Noted to be 22 yesterday.  We will recheck another level tomorrow.  History of coronary artery disease status post RCA stent/history of TAVR Followed by Dr. Burt Knack with [facility].  Currently on carvedilol which will be continued.  He is not on any antiplatelet agents due to his thrombocytopenia.  Hold his nitrates for now.  Blood pressure noted to be somewhat borderline this morning.  History of depression Continue  home medication.  History of prostate cancer and hematuria Management per urology.  DVT Prophylaxis: SCDs    Code Status: Full code    LOS: 0 days   Illa Enlow [employer] Pager on [email or web address]  12/23/2018, 10:59 AM

## 2018-12-23 NOTE — Progress Notes (Signed)
  Subjective: Patient reports He is tired, didn't get much sleep.  Up to the floor at 3 AM. I spoke with the nurse and she had to irrigate a few clots.  Otherwise CBI running at a moderate drip and urine remains red.  Objective: Vital signs in last 24 hours: Temp:  [98.1 F (36.7 C)-98.6 F (37 C)] 98.1 F (36.7 C) (02/10 0406) Pulse Rate:  [77-101] 77 (02/10 0406) Resp:  [15-18] 16 (02/10 0406) BP: (108-191)/(62-114) 108/62 (02/10 0406) SpO2:  [100 %] 100 % (02/10 0406) Weight:  [70.1 kg] 70.1 kg (02/10 0043)  Intake/Output from previous day: 02/09 0701 - 02/10 0700 In: 9634.2 [I.V.:34.2] Out: 15600 [Urine:14200] Intake/Output this shift: Total I/O In: -  Out: 3200 [Urine:3200]  Physical Exam:  NAD Alert and oriented  Abd - soft, NT GU - Foley in position, urine red on a moderate CBI drip.  I slowed his CBI down in the urine was darker red.  Lab Results: Recent Labs    12/22/18 1713 12/23/18 0409  HGB 10.0* 9.3*  HCT 30.8* 28.9*   BMET Recent Labs    12/22/18 1713 12/23/18 0409  NA 136 138  K 4.4 3.3*  CL 102 104  CO2 26 26  GLUCOSE 117* 129*  BUN 16 14  CREATININE 0.85 0.80  CALCIUM 8.6* 8.5*   No results for input(s): LABPT, INR in the last 72 hours. No results for input(s): LABURIN in the last 72 hours. Results for orders placed or performed during the hospital encounter of 02/11/16  Surgical pcr screen     Status: None   Collection Time: 02/11/16  9:51 AM  Result Value Ref Range Status   MRSA, PCR NEGATIVE NEGATIVE Final   Staphylococcus aureus NEGATIVE NEGATIVE Final    Comment:        The Xpert SA Assay (FDA approved for NASAL specimens in patients over 33 years of age), is one component of a comprehensive surveillance program.  Test performance has been validated by [facility] for patients greater than or equal to 24 year old. It is not intended to diagnose infection nor to guide or monitor treatment.     Studies/Results: No  results found.  Assessment/Plan: Gross hematuria- I discussed with the patient the nature, potential benefits, risks and alternatives to Cystoscopy, clot evacuation with possible TURP possible TURBT, including side effects of the proposed treatment, the likelihood of the patient achieving the goals of the procedure, and any potential problems that might occur during the procedure or recuperation. We discussed risk of incontinence and stricture among others.  He is also been complaining of some mucousy or possibly watery bowel movements and we can do a rectal exam and ensure no fistula. All questions answered. Patient elects to proceed.   Prostate Cancer - on Lupron. Erleada approval pending, but pt needs hip xray and bone scan.   Anemia - monitor. Hgb acceptable without transfusion. Appreciate Dr. Allen Kell recommendations.    LOS: 0 days   [name] 12/23/2018, 8:09 AM

## 2018-12-23 NOTE — Op Note (Signed)
Preoperative diagnosis: Gross hematuria Postoperative diagnosis: Gross hematuria, hemorrhagic cystitis  Procedure: Cystoscopy with TURBT 0.5 to 2 cm, fulguration of right bladder neck, limited TURP for hemostasis.  Surgeon: Junious Silk  Anesthesia: General  Indication for procedure: Corey Oneill is an 83 year old male with a history of radiation for prostate cancer.  He has had gross hematuria on and off over the past year.  He had some oozing around the left bladder neck stone September 2019 which I removed.  He also had bladder biopsies which were normal.  Since about January 22 and may be before he is had recurrent gross hematuria with clot retention.  Last night had to initiate CBI.  He was brought today for exam under anesthesia and cystoscopy.  He is also been complaining of some changes with his bowel movements.  Findings: On exam under anesthesia the penis was circumcised and without mass or lesion.  The right testicle was atrophic and slightly higher riding but palpably normal without mass.  Left testicle palpably normal.  On DRE the prostate was flat and radiated and I felt no mucosal abnormality or sign of a fistula.  There was also no fluid or blood in the rectal vault.  On cystoscopy the urethra was unremarkable, the prostatic urethra was patent but there was a clot on the prostatic urethra on the right side going over toward the bladder neck.  There was no sign of any fistula.  The prostate is short and actually appears obstructive when the water is off although the lobes are compressible.  We will need to keep an eye on that.  In the bladder there was a clot at the right superior bladder neck about the 10 o'clock position in this area may have been bleeding.  The only other finding was a peculiar protrusion of bladder mucosa posteriorly in the midline and this was biopsied although that may have been from the catheter.  Overall no significant bleeding point noted.  There was no stone or foreign  body in the bladder.  There was clear efflux bilaterally.  Description of procedure: After consent was obtained the patient brought to the operating room and placed in lithotomy position.  I did an exam under anesthesia.  He was prepped and draped in usual sterile fashion.  A timeout was performed to confirm the patient and procedure.  Cystoscope was passed per urethra and the bladder carefully inspected with 30 degree and 70 degree lens.  There was no active bleeding.  No clots in the bladder.  The scope was withdrawn and the continuous flow sheath passed with the visual obturator and then the loop and handle passed.  I biopsied the posterior bladder wall and sent this.  I then fulgurated the right anterior bladder neck.  I then did a shallow swipe of the right and left prostate and where there was some clot and possible prior bleeding.  The sites were fulgurated.  I looked again and saw no other sign of any bleeding.  The entire bladder has a hemorrhagic/edematous appearance.  Initially his functional capacity was low and he was even voiding around the scope under anesthesia.  But at his bladder relaxed it filled and emptied easier.  Hemostasis was excellent under low pressure.  The scope was removed an 26 French Foley catheter placed in left to gravity drainage.  Urine was clear.  He was awakened taken recovery in stable condition.  Complications: None  Blood loss: Minimal  Specimens to pathology: #1 posterior bladder biopsy #2  TURP biopsies-shallow biopsies of the right and left prostatic urethra-mainly done for hemostasis  Drains: 18 French Foley catheter  Disposition: Patient stable to PACU.  His blood pressure was a little low in the OR and an H&H was sent.

## 2018-12-23 NOTE — Anesthesia Preprocedure Evaluation (Signed)
Anesthesia Evaluation  Patient identified by MRN, date of birth, ID band Patient awake    Reviewed: Allergy & Precautions, H&P , NPO status , Patient's Chart, lab work & pertinent test results  Airway Mallampati: II   Neck ROM: full    Dental  (+) Teeth Intact, Caps, Dental Advisory Given,    Pulmonary former smoker,    breath sounds clear to auscultation       Cardiovascular + angina + CAD, + Cardiac Stents and + Peripheral Vascular Disease  + Valvular Problems/Murmurs  Rhythm:regular Rate:Normal  S/p TAVR (2017)   Echo 09/2018 - Left ventricle: The cavity size was normal. Wall thickness was increased in a pattern of moderate LVH. Systolic function was normal. The estimated ejection fraction was in the range of 60% to 65%. Wall motion was normal; there were no regional wall motion abnormalities. Doppler parameters are consistent with abnormal left ventricular relaxation (grade 1 diastolic dysfunction). - Aortic valve: A bioprosthesis was present. - Mitral valve: Valve area by pressure half-time: 2.5 cm^2.   Neuro/Psych PSYCHIATRIC DISORDERS Depression    GI/Hepatic GERD  ,  Endo/Other    Renal/GU      Musculoskeletal   Abdominal   Peds  Hematology  (+) Blood dyscrasia, anemia ,   Anesthesia Other Findings   Reproductive/Obstetrics                             Anesthesia Physical  Anesthesia Plan  ASA: III  Anesthesia Plan: General   Post-op Pain Management:    Induction: Intravenous  PONV Risk Score and Plan: 2 and Ondansetron, Dexamethasone and Treatment may vary due to age or medical condition  Airway Management Planned: LMA  Additional Equipment:   Intra-op Plan:   Post-operative Plan: Extubation in OR  Informed Consent: I have reviewed the patients History and Physical, chart, labs and discussed the procedure including the risks, benefits and alternatives for the  proposed anesthesia with the patient or authorized representative who has indicated his/her understanding and acceptance.     Dental advisory given  Plan Discussed with: CRNA  Anesthesia Plan Comments:         Anesthesia Quick Evaluation

## 2018-12-23 NOTE — Anesthesia Procedure Notes (Signed)
Procedure Name: LMA Insertion Date/Time: 12/23/2018 5:35 PM Performed by: Claudia Desanctis, CRNA Pre-anesthesia Checklist: Emergency Drugs available, Patient identified, Suction available and Patient being monitored Patient Re-evaluated:Patient Re-evaluated prior to induction Oxygen Delivery Method: Circle system utilized Preoxygenation: Pre-oxygenation with 100% oxygen Induction Type: IV induction Ventilation: Mask ventilation without difficulty LMA: LMA inserted LMA Size: 4.0 Number of attempts: 1 Placement Confirmation: positive ETCO2 and breath sounds checked- equal and bilateral Tube secured with: Tape Dental Injury: Teeth and Oropharynx as per pre-operative assessment

## 2018-12-23 NOTE — Discharge Instructions (Signed)

## 2018-12-23 NOTE — Transfer of Care (Signed)
Immediate Anesthesia Transfer of Care Note  Patient: Corey Oneill  Procedure(s) Performed: CYSTOSCOPY WITH  BLADDER BIOPSY WITH FULGERATION/ TRANSURETHRAL RESECTION PROSTATE  AND PROSTATE BIOPSY (Bladder)  Patient Location: PACU  Anesthesia Type:General  Level of Consciousness: awake and patient cooperative  Airway & Oxygen Therapy: Patient Spontanous Breathing and Patient connected to face mask  Post-op Assessment: Report given to RN and Post -op Vital signs reviewed and stable  Post vital signs: Reviewed and stable  Last Vitals:  Vitals Value Taken Time  BP    Temp    Pulse 88 12/23/2018  6:32 PM  Resp 10 12/23/2018  6:32 PM  SpO2 100 % 12/23/2018  6:32 PM  Vitals shown include unvalidated device data.  Last Pain:  Vitals:   12/23/18 1349  TempSrc: Oral  PainSc:       Patients Stated Pain Goal: 3 (39/03/00 9233)  Complications: No apparent anesthesia complications

## 2018-12-23 NOTE — Anesthesia Postprocedure Evaluation (Signed)
Anesthesia Post Note  Patient: Corey Oneill  Procedure(s) Performed: CYSTOSCOPY WITH  BLADDER BIOPSY WITH FULGERATION/ TRANSURETHRAL RESECTION PROSTATE  AND PROSTATE BIOPSY (Bladder)     Patient location during evaluation: PACU Anesthesia Type: General Level of consciousness: sedated and patient cooperative Pain management: pain level controlled Vital Signs Assessment: post-procedure vital signs reviewed and stable Respiratory status: spontaneous breathing Cardiovascular status: stable Anesthetic complications: no    Last Vitals:  Vitals:   12/23/18 1930 12/23/18 2001  BP: 123/65 117/65  Pulse: 91 91  Resp: 12 18  Temp: 37 C 36.5 C  SpO2: 100% 100%    Last Pain:  Vitals:   12/23/18 2000  TempSrc:   PainSc: 0-No pain                 Nolon Nations

## 2018-12-24 ENCOUNTER — Inpatient Hospital Stay (HOSPITAL_COMMUNITY): Payer: Medicare Other

## 2018-12-24 DIAGNOSIS — D500 Iron deficiency anemia secondary to blood loss (chronic): Secondary | ICD-10-CM

## 2018-12-24 LAB — BASIC METABOLIC PANEL
Anion gap: 9 (ref 5–15)
BUN: 12 mg/dL (ref 8–23)
CHLORIDE: 103 mmol/L (ref 98–111)
CO2: 22 mmol/L (ref 22–32)
Calcium: 8 mg/dL — ABNORMAL LOW (ref 8.9–10.3)
Creatinine, Ser: 0.66 mg/dL (ref 0.61–1.24)
GFR calc non Af Amer: >60 mL/min (ref >60)
Glucose, Bld: 133 mg/dL — ABNORMAL HIGH (ref 70–99)
Potassium: 4.3 mmol/L (ref 3.5–5.1)
Sodium: 134 mmol/L — ABNORMAL LOW (ref 135–145)

## 2018-12-24 LAB — CBC
HCT: 26.8 % — ABNORMAL LOW (ref 39.0–52.0)
Hemoglobin: 8.6 g/dL — ABNORMAL LOW (ref 13.0–17.0)
MCH: 32.2 pg (ref 26.0–34.0)
MCHC: 32.1 g/dL (ref 30.0–36.0)
MCV: 100.4 fL — ABNORMAL HIGH (ref 80.0–100.0)
NRBC: 0 % (ref 0.0–0.2)
Platelets: 82 10*3/uL — ABNORMAL LOW (ref 150–400)
RBC: 2.67 MIL/uL — AB (ref 4.22–5.81)
RDW: 14.5 % (ref 11.5–15.5)
WBC: 9.4 10*3/uL (ref 4.0–10.5)

## 2018-12-24 LAB — BPAM RBC
BLOOD PRODUCT EXPIRATION DATE: 202003062359
ISSUE DATE / TIME: 202002102254
Unit Type and Rh: 5100

## 2018-12-24 LAB — TYPE AND SCREEN
ABO/RH(D): O POS
Antibody Screen: NEGATIVE
UNIT DIVISION: 0

## 2018-12-24 MED ORDER — FERROUS SULFATE 325 (65 FE) MG PO TBEC: 325.0000 mg | DELAYED_RELEASE_TABLET | Freq: Every day | ORAL | 2 refills | Status: DC

## 2018-12-24 NOTE — Discharge Summary (Signed)
Physician Discharge Summary  Patient ID: Corey Oneill MRN: [id number] DOB/AGE: Dec 08, 1935 83 y.o.  Admit date: 12/22/2018 Discharge date: 12/24/2018  Admission Diagnoses: Gross hematuria  Discharge Diagnoses:  Active Problems:   Gross hematuria   Discharged Condition: good  Hospital Course: Mr. Shipper was admitted with gross hematuria and clot retention.  The bladder was irrigated and he was started on CBI.  He was taken to the operating room and underwent cystoscopy.  The bleeding stopped but there was a small clot in the prostatic urethra as well as the right anterior bladder neck where the bleeding likely occurred all consistent with radiation cystitis.  He underwent a minimal TURP to resurface the prostate and fulguration of the bladder neck.  He also underwent bladder biopsy but this was likely catheter changes.  He was hypotensive in the operating room and pale and therefore underwent a 1 unit blood transfusion.  Today, on postop day 1, hospital day #2 his urine is clear. Hgb 8.6 and he is not bleeding.  He is AFVSS.  Tolerating regular diet and ready for discharge  Consults: Hospitalist   Significant Diagnostic Studies: none  Treatments: surgery: Cystoscopy, bladder biopsy, limited TURP; 1 unit packed red blood cell transfusion  Discharge Exam: Blood pressure 108/62, pulse 79, temperature 97.6 F (36.4 C), temperature source Oral, resp. rate 18, height 5\' 10"  (1.778 m), weight 70.1 kg, SpO2 97 %. No acute distress Watching TV Neurologic-no focal deficits Abdomen-soft nontender GU-Foley in place, urine clear Extremity-no calf Pain or swelling  Disposition: Discharge disposition: 01-Home or Self Care       Discharge Instructions    Discharge patient   Complete by:  As directed    With Foley to gravity   Discharge disposition:  01-Home or Self Care   Discharge patient date:  12/24/2018     Allergies as of 12/24/2018   No Known Allergies     Medication List    TAKE  these medications   alfuzosin 10 MG 24 hr tablet Commonly known as:  UROXATRAL Take 10 mg by mouth daily with breakfast.   carvedilol 3.125 MG tablet Commonly known as:  COREG Take 1 tablet (3.125 mg total) by mouth 2 (two) times daily.   cephALEXin 500 MG capsule Commonly known as:  KEFLEX Take 1 capsule (500 mg total) by mouth at bedtime for 10 days.   DULoxetine 30 MG capsule Commonly known as:  CYMBALTA Take 1 capsule (30 mg total) by mouth daily.   ferrous sulfate 325 (65 FE) MG EC tablet Take 1 tablet (325 mg total) by mouth daily with breakfast. If no side effects, change to twice daily after 1 week.   isosorbide mononitrate 30 MG 24 hr tablet Commonly known as:  IMDUR Take 1 tablet (30 mg total) by mouth daily.   MULTIPLE VITAMIN PO Take 1 tablet by mouth daily.   nitroGLYCERIN 0.4 MG SL tablet Commonly known as:  NITROSTAT 1 TAB UNDER TONGUE AS NEEDED FOR CHEST PAIN. MAY REPEAT EVERY 5 MIN FOR A TOTAL OF 3 DOSES. What changed:  See the new instructions.   rosuvastatin 10 MG tablet Commonly known as:  CRESTOR TAKE 1 TABLET ONCE DAILY.   vortioxetine HBr 5 MG Tabs tablet Commonly known as:  TRINTELLIX Take 1 tablet (5 mg total) by mouth daily.      Follow-up Information    [name], MD On 12/31/2018.   Specialty:  Urology Why:  with PA Romilda Garret at 9:15 AM  Contact information:  [address] [place]  [address] [phone]           Signed: [name] 12/24/2018, 8:48 AM

## 2018-12-24 NOTE — Progress Notes (Signed)
[facility] PROGRESS NOTE  Corey Oneill CBJ:[id number] DOB: 03-14-1936 DOA: 12/22/2018  PCP: [name], MD  Brief History/Interval Summary: 83 y.o. man with hx of prostate CA, CAD s/p PCI to RCA, AS s/p TAVR (2017), hx of ITP and thrombocytopenia who presented with recurrent hematuria in setting of prostate malignancy.  This has been an issue for the past few weeks.  Patient has been following up with his urologist.  He was subsequently hospitalized.  Tried hospitalist was consulted to assist with management of his medical issues.    Procedures: Cystoscopy with TURBT 0.5 to 2 cm, fulguration of right bladder neck, limited TURP for hemostasis.  Antibiotics: None  Subjective/Interval History: Patient states that he feels well this morning.  Urine has cleared up.  Did not have any significant pain issues.  No shortness of breath.  Agreeable to try ferrous sulfate.  Side effects discussed.   Objective:  Vital Signs  Vitals:   12/23/18 2245 12/23/18 2315 12/24/18 0219 12/24/18 0543  BP: 118/72 103/61 107/60 108/62  Pulse: 91 89 87 79  Resp: 18 18 18 18   Temp: 97.8 F (36.6 C) (!) 97.5 F (36.4 C) 98 F (36.7 C) 97.6 F (36.4 C)  TempSrc: Oral Oral Oral Oral  SpO2: 99% 95% 97% 97%  Weight:      Height:        Intake/Output Summary (Last 24 hours) at 12/24/2018 0834 Last data filed at 12/24/2018 0400 Gross per 24 hour  Intake 15357.27 ml  Output 14655 ml  Net 702.27 ml   Filed Weights   12/23/18 0043  Weight: 70.1 kg   General appearance: Awake alert.  In no distress Resp: Clear to auscultation bilaterally.  Normal effort Cardio: S1-S2 is normal regular.  No S3-S4.  No rubs murmurs or bruit GI: Abdomen is soft.  Nontender nondistended.  Bowel sounds are present normal.  No masses organomegaly Extremities: No edema.  Full range of motion of lower extremities. Neurologic: Alert and oriented x3.  No focal neurological deficits.    Lab Results:  Data  Reviewed: I have personally reviewed following labs and imaging studies  CBC: Recent Labs  Lab 12/22/18 1713 12/23/18 0409 12/23/18 1932 12/24/18 0428  WBC 10.2  --   --  9.4  NEUTROABS 9.4*  --   --   --   HGB 10.0* 9.3* 8.1* 8.6*  HCT 30.8* 28.9* 25.2* 26.8*  MCV 100.7*  --   --  100.4*  PLT 94*  --   --  82*    Basic Metabolic Panel: Recent Labs  Lab 12/22/18 1713 12/23/18 0409 12/24/18 0428  NA 136 138 134*  K 4.4 3.3* 4.3  CL 102 104 103  CO2 26 26 22   GLUCOSE 117* 129* 133*  BUN 16 14 12   CREATININE 0.85 0.80 0.66  CALCIUM 8.6* 8.5* 8.0*    GFR: Estimated Creatinine Clearance: 73 mL/min (by C-G formula based on SCr of 0.66 mg/dL).   Radiology Studies: No results found.   Medications:  Scheduled: . carvedilol  3.125 mg Oral BID  . docusate sodium  100 mg Oral BID  . DULoxetine  30 mg Oral Daily  . rosuvastatin  10 mg Oral q1800  . senna  1 tablet Oral BID  . tamsulosin  0.4 mg Oral BID  . [START ON 01/09/2019] vortioxetine HBr  5 mg Oral Daily   Continuous: . sodium chloride 100 mL/hr at 12/24/18 0400   HYW:[id number], diphenhydrAMINE **OR** diphenhydrAMINE, hyoscyamine,  neomycin-bacitracin-polymyxin, nitroGLYCERIN, ondansetron, traMADol    Assessment/Plan:  Anemia due to acute blood loss Baseline hemoglobin between 12 and 13.  Noted to be 10.0 at the time of admission.  Decreased to 9.3 subsequently.  He was transfused after surgery yesterday by urology.  Patient could also be deficient in iron due to his blood loss over the last many weeks.  He is agreeable to try ferrous sulfate.  Side effects discussed with the patient.  Will prescribe in the same.    Stable thrombocytopenia Patient has a history of ITP.  Platelet counts usually run in the 40s and 50s.  Noted to be 94 of admission.  82,000 this morning.  No further evaluation necessary while he is in the hospital.  History of coronary artery disease status post RCA stent/history of  TAVR Followed by Dr. Burt Knack with [facility] heart care.  Currently on carvedilol which will be continued.  He is not on any antiplatelet agents due to his thrombocytopenia.  He may continue with his home medication regimen.   History of depression Continue home medication.  History of prostate cancer and hematuria Management per urology.  DVT Prophylaxis: SCDs    Code Status: Full code  Thank you for this consult.  Patient remains medically stable.  Okay for discharge from medical standpoint.   LOS: 1 day   Kadian Barcellos [employer] on [email or web address]  12/24/2018, 8:34 AM

## 2018-12-24 NOTE — Progress Notes (Signed)
Pt discharged to home, instructions reviewed with patient. Pt acknowledged understanding. Foley bag changed to leg bag. Additional supplies sent home with patient. Wife to transport pt home. SRP, RN

## 2018-12-25 ENCOUNTER — Encounter (HOSPITAL_COMMUNITY): Payer: Self-pay | Admitting: Urology

## 2018-12-25 LAB — URINE CULTURE: Culture: >=100000 — AB

## 2018-12-26 ENCOUNTER — Other Ambulatory Visit (HOSPITAL_COMMUNITY): Payer: Self-pay | Admitting: Urology

## 2018-12-26 ENCOUNTER — Other Ambulatory Visit: Payer: Self-pay | Admitting: Urology

## 2018-12-26 DIAGNOSIS — C61 Malignant neoplasm of prostate: Secondary | ICD-10-CM

## 2018-12-28 ENCOUNTER — Emergency Department (HOSPITAL_COMMUNITY)
Admission: EM | Admit: 2018-12-28 | Discharge: 2018-12-28 | Disposition: A | Discharge status: Home / Self Care | Payer: Medicare Other | Attending: Emergency Medicine | Admitting: Emergency Medicine

## 2018-12-28 ENCOUNTER — Other Ambulatory Visit: Payer: Self-pay

## 2018-12-28 ENCOUNTER — Encounter (HOSPITAL_COMMUNITY): Payer: Self-pay | Admitting: Emergency Medicine

## 2018-12-28 ENCOUNTER — Telehealth: Payer: Self-pay | Admitting: Urology

## 2018-12-28 DIAGNOSIS — Z952 Presence of prosthetic heart valve: Secondary | ICD-10-CM | POA: Diagnosis not present

## 2018-12-28 DIAGNOSIS — Z87891 Personal history of nicotine dependence: Secondary | ICD-10-CM | POA: Insufficient documentation

## 2018-12-28 DIAGNOSIS — Z85828 Personal history of other malignant neoplasm of skin: Secondary | ICD-10-CM | POA: Insufficient documentation

## 2018-12-28 DIAGNOSIS — N4889 Other specified disorders of penis: Secondary | ICD-10-CM | POA: Insufficient documentation

## 2018-12-28 DIAGNOSIS — Z8546 Personal history of malignant neoplasm of prostate: Secondary | ICD-10-CM | POA: Diagnosis not present

## 2018-12-28 DIAGNOSIS — Y69 Unspecified misadventure during surgical and medical care: Secondary | ICD-10-CM | POA: Insufficient documentation

## 2018-12-28 DIAGNOSIS — R310 Gross hematuria: Secondary | ICD-10-CM | POA: Diagnosis present

## 2018-12-28 DIAGNOSIS — I2510 Atherosclerotic heart disease of native coronary artery without angina pectoris: Secondary | ICD-10-CM | POA: Diagnosis not present

## 2018-12-28 DIAGNOSIS — Z79899 Other long term (current) drug therapy: Secondary | ICD-10-CM | POA: Insufficient documentation

## 2018-12-28 DIAGNOSIS — T839XXA Unspecified complication of genitourinary prosthetic device, implant and graft, initial encounter: Secondary | ICD-10-CM | POA: Diagnosis not present

## 2018-12-28 DIAGNOSIS — Z923 Personal history of irradiation: Secondary | ICD-10-CM | POA: Insufficient documentation

## 2018-12-28 DIAGNOSIS — T83098A Other mechanical complication of other indwelling urethral catheter, initial encounter: Secondary | ICD-10-CM | POA: Diagnosis not present

## 2018-12-28 NOTE — ED Provider Notes (Signed)
[facility] DEPT Provider Note   CSN: [id number] Arrival date & time: 12/28/18  1304     History   Chief Complaint Chief Complaint  Patient presents with  . painful catheter  . Penis Pain    HPI Corey Oneill is a 83 y.o. male.  HPI Patient was recently admitted to the hospital for issues with gross hematuria.   Patient ended up having surgery on February 10.  He had cystoscopy with bladder biopsy and transurethral resection of the prostate.  Patient left with a Foley catheter in place.  Patient states since having the catheter placed he has had discomfort at the tip of his penis.  Patient states the catheter has been draining well.  He has not noticed any blood in the urine.  Patient called the urologist on call today he was instructed to come to the ED.  According to the notes in the medical record Dr. Clydene Laming indicates it is reasonable for catheter removal.  Pt would need to have a trial of voiding. Past Medical History:  Diagnosis Date  . Abdominal aortic atherosclerosis ([place])   . Anginal pain ([place])   . BPPV (benign paroxysmal positional vertigo)   . CAD (coronary artery disease)    a.  LHC 8/16: Mid to distal LAD 30%, OM1 40%, proximal mid RCA 40%, distal RCA 60% >> FFR 0.69  >> PCI: 3 x 15 mm Resolute DES  . Depression   . Esophageal reflux   . Family history of adverse reaction to anesthesia    "daughter has PONV"  . Grade I diastolic dysfunction 20/25/4270   Noted on ECHO   . H/O blood clots    "get them in my stool and urine" (11/12/2015)  . Heart murmur   . Hepatic lesion 12/08/2015   Stable 8 mm right hepatic lesion  . History of aortic stenosis    a. peak to peak gradient by LHC 8/16:  37 mmHg (moderately severe)   . History of appendicitis 2016  . History of herpes labialis   . History of ITP    2018, thrombocytopenia  . History of kidney stones   . Hx of radiation therapy 04/14/13-06/09/13   prostate 7800 cGy, 40 sessions,  seminal vesicles 5600 cGy 40 sessions  . Hydrocele 2006   Small  . Hyperlipidemia   . Insomnia    resolved  . 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 Prostate cancer ([place]) dx'd 2014  . Radiation proctitis   . Renal mass 2017   Bilateral renal masses  . Wears hearing aid in both ears     Patient Active Problem List   Diagnosis Date Noted  . Gross hematuria 12/22/2018  . Radiation proctitis   . Hx of radiation therapy   . Heart murmur   . H/O blood clots   . Family history of adverse reaction to anesthesia   . CAD (coronary artery disease)   . Aortic stenosis   . Anginal pain ([place])   . Diarrhea in adult patient 06/06/2017  . Personal history of prostate cancer 06/06/2017  . Melanoma ([place]) 06/06/2017  . History of adenomatous polyp of colon 06/06/2017  . History of ITP 02/26/2017  . 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 S/P appendectomy 11/12/2015  . Esophageal reflux 03/22/2015  . Depression 11/20/2014  . Prostate cancer (  [place]) 03/05/2013  . Erectile dysfunction   . Hyperlipidemia 06/07/2007    Past Surgical History:  Procedure Laterality Date  . CARDIAC CATHETERIZATION N/A 04/21/2015   Procedure: Right/Left Heart Cath and Coronary Angiography;  Surgeon: [name], MD;  Location: [place] [facility];  Service: Cardiovascular;  Laterality: N/A;  . CARDIAC CATHETERIZATION N/A 06/18/2015   Procedure: Intravascular Pressure Wire/FFR Study;  Surgeon: [name], MD;  Location: [place] [facility];  Service: Cardiovascular;  Laterality: N/A;  . CARDIAC CATHETERIZATION N/A 06/18/2015   Procedure: Coronary Stent Intervention;  Surgeon: [name], MD;  Location: [place] [facility];  Service: Cardiovascular;  Laterality: N/A;  . CARDIAC CATHETERIZATION N/A 06/18/2015   Procedure: Right/Left Heart Cath and Coronary Angiography;   Surgeon: [name], MD;  Location: [place] [facility];  Service: Cardiovascular;  Laterality: N/A;  . CARDIAC CATHETERIZATION N/A 06/23/2015   Procedure: Left Heart Cath and Cors/Grafts Angiography;  Surgeon: [name], MD;  Location: [place] [facility] LAB;  Service: Cardiovascular;  Laterality: N/A;  . CARDIAC CATHETERIZATION N/A 01/17/2016   Procedure: Right/Left Heart Cath and Coronary Angiography;  Surgeon: [name], MD;  Location: [place] [facility];  Service: Cardiovascular;  Laterality: N/A;  . CATARACT EXTRACTION, BILATERAL    . COLONOSCOPY  06/26/2017  . CYSTOSCOPY WITH BIOPSY N/A 07/30/2018   Procedure: CYSTOSCOPY WITH BIOPSY/FULGURATION, CYSTOLTHOLAPAXY;  Surgeon: [name], MD;  Location[facility];  Service: Urology;  Laterality: N/A;  . FRACTURE SURGERY    . INGUINAL HERNIA REPAIR     patient does not remember this procedure  . LAPAROSCOPIC APPENDECTOMY N/A 11/16/2015   Procedure: APPENDECTOMY LAPAROSCOPIC;  Surgeon: [name], MD;  Location: [place];  Service: General;  Laterality: N/A;  . LEFT HEART CATH AND CORONARY ANGIOGRAPHY N/A 12/26/2016   Procedure: Left Heart Cath and Coronary Angiography;  Surgeon: Troy Sine, MD;  Location: [place] [facility];  Service: Cardiovascular;  Laterality: N/A;  . MELANOMA EXCISION Right 10/24/2016   Procedure: WIDE EXCISION MELANOMA RIGHT NECK AND RIGHT CHEST TIMES 2;  Surgeon: [name], MD;  Location: [place];  Service: General;  Laterality: Right;  right medial and lateral lesion of chest and right neck  . NASAL FRACTURE SURGERY     "broken years ago; several ORs to correct it"  . NASAL SEPTUM SURGERY    . PROSTATE BIOPSY  2014   "needle biopsy"  . TEE WITHOUT CARDIOVERSION N/A 02/15/2016   Procedure: TRANSESOPHAGEAL ECHOCARDIOGRAM (TEE);  Surgeon: [name], MD;  Location: [place];  Service: Open Heart Surgery;  Laterality: N/A;  . TONSILLECTOMY    . TRANSCATHETER AORTIC  VALVE REPLACEMENT, TRANSFEMORAL  02/15/2016  . TRANSCATHETER AORTIC VALVE REPLACEMENT, TRANSFEMORAL N/A 02/15/2016   Procedure: TRANSCATHETER AORTIC VALVE REPLACEMENT, TRANSFEMORAL;  Surgeon: [name], MD;  Location: [place];  Service: Open Heart Surgery;  Laterality: N/A;  . TRANSURETHRAL RESECTION OF PROSTATE  12/23/2018   Procedure: CYSTOSCOPY WITH  BLADDER BIOPSY WITH FULGERATION/ TRANSURETHRAL RESECTION PROSTATE  AND PROSTATE BIOPSY;  Surgeon: [name], MD;  Location: [facility];  Service: Urology;;        Home Medications    Prior to Admission medications   Medication Sig Start Date End Date Taking? Authorizing Provider  alfuzosin (UROXATRAL) 10 MG 24 hr tablet Take 10 mg by mouth daily with breakfast.    [provider]  carvedilol (COREG) 3.125 MG tablet Take 1 tablet (3.125 mg total) by mouth  2 (two) times daily. 06/04/18   [name], MD  cephALEXin (KEFLEX) 500 MG capsule Take 1 capsule (500 mg total) by mouth at bedtime for 10 days. 12/23/18 01/02/19  [name], MD  DULoxetine (CYMBALTA) 30 MG capsule Take 1 capsule (30 mg total) by mouth daily. 12/19/18   [name], MD  ferrous sulfate 325 (65 FE) MG EC tablet Take 1 tablet (325 mg total) by mouth daily with breakfast. If no side effects, change to twice daily after 1 week. 12/24/18   Bonnielee Haff, MD  isosorbide mononitrate (IMDUR) 30 MG 24 hr tablet Take 1 tablet (30 mg total) by mouth daily. 10/22/18 10/22/19  Richardson Dopp T, PA-C  MULTIPLE VITAMIN PO Take 1 tablet by mouth daily.    [provider]  nitroGLYCERIN (NITROSTAT) 0.4 MG SL tablet 1 TAB UNDER TONGUE AS NEEDED FOR CHEST PAIN. MAY REPEAT EVERY 5 MIN FOR A TOTAL OF 3 DOSES. Patient taking differently: Place 0.4 mg under the tongue every 5 (five) minutes as needed for chest pain.  09/26/18   [name], MD  rosuvastatin (CRESTOR) 10 MG tablet TAKE 1 TABLET ONCE DAILY. Patient taking differently: Take 10 mg by mouth daily.   10/28/18   [name], MD  vortioxetine HBr (TRINTELLIX) 5 MG TABS tablet Take 1 tablet (5 mg total) by mouth daily. Patient not taking: Reported on 12/22/2018 12/19/18   [name], MD    Family History Family History  Problem Relation Age of Onset  . Brain cancer Father   . Lymphoma Mother   . Depression Daughter     Social History Social History   Tobacco Use  . Smoking status: Former Smoker    Packs/day: 0.00    Types: Cigarettes  . Smokeless tobacco: Never Used  . Tobacco comment: "quit smoking cigarettes in the 1960s; quit smoking cigars in the 1990s  Substance Use Topics  . Alcohol use: Yes    Comment: 2 DRINKS PER DAY  . Drug use: No     Allergies   Patient has no known allergies.   Review of Systems Review of Systems  All other systems reviewed and are negative.    Physical Exam Updated Vital Signs BP 133/66 (BP Location: Left Arm)   Pulse 67   Temp 97.6 F (36.4 C) (Oral)   Resp 18   SpO2 100%   Physical Exam Vitals signs and nursing note reviewed.  Constitutional:      General: He is not in acute distress.    Appearance: He is well-developed.  HENT:     Head: Normocephalic and atraumatic.     Right Ear: External ear normal.     Left Ear: External ear normal.  Eyes:     General: No scleral icterus.       Right eye: No discharge.        Left eye: No discharge.     Conjunctiva/sclera: Conjunctivae normal.  Neck:     Musculoskeletal: Neck supple.     Trachea: No tracheal deviation.  Cardiovascular:     Rate and Rhythm: Normal rate.  Pulmonary:     Effort: Pulmonary effort is normal. No respiratory distress.     Breath sounds: No stridor.  Abdominal:     General: There is no distension.  Genitourinary:    Comments: Foley catheter in place, leg bag is noted to be very high up on the patient's thigh, the Foley catheter is curled around causing superior traction on the  distal aspect of the urethra of the penis Musculoskeletal:         General: No swelling or deformity.  Skin:    General: Skin is warm and dry.     Findings: No rash.  Neurological:     Mental Status: He is alert.     Cranial Nerves: Cranial nerve deficit: no gross deficits.      ED Treatments / Results   Procedures Procedures (including critical care time)  Medications Ordered in ED Medications - No data to display   Initial Impression / Assessment and Plan / ED Course  I have reviewed the triage vital signs and the nursing notes.  Pertinent labs & imaging results that were available during my care of the patient were reviewed by me and considered in my medical decision making (see chart for details).   I reviewed the urology notes with the patient.  I explained that we could remove the catheter but there is a chance that he would have difficulty urinating.  I would not have the patient wait in the emergency room for several hours until he can urinate but would rather have him come back to the emergency room if he was not able to urinate by this evening.  Patient states it is feeling better now after I adjusted his catheter lower down on the leg so that it was not causing any traction.  He would prefer to leave the catheter in place right now and follow-up with urology as scheduled on Tuesday.  Final Clinical Impressions(s) / ED Diagnoses   Final diagnoses:  Complication of Foley catheter, initial encounter [facility])    ED Discharge Orders    None       Dorie Rank, MD 12/28/18 1540

## 2018-12-28 NOTE — ED Notes (Signed)
Stat Lock applied on Left upper leg.

## 2018-12-28 NOTE — Telephone Encounter (Signed)
Contacted by patient who called to request his Foley catheter be removed.   Patient is an 83 year old male with a history of radiation for prostate cancer. He has had intermittent gross hematuria over the past year. He was recently admitted for [facility] with clot retention and underwent cysto/TUR/fulguration on 2/10. He was discharged with a Foley catheter in place with plans to follow up in clinic on 2/18. Patient states he is having significant discomfort from his Foley catheter which is preventing him from sleeping. His catheter is supposedly draining well and urine is clear.  At this point, it would be reasonable for catheter removal. The office is closed today, so patient's only option would be to have his catheter removed in the ED. Recommend formal TOV prior to discharge. Patient will follow up on 2/18 as scheduled. Advised to contact us if he has any difficulty urinating.

## 2018-12-28 NOTE — Discharge Instructions (Addendum)
Follow up with urology as planned, return as needed for worsening symptoms

## 2018-12-28 NOTE — ED Triage Notes (Signed)
Pt c/o penile pain and pain from catheter that was placed on 12/23/2018. Pt states catheter is draining well.

## 2018-12-30 ENCOUNTER — Other Ambulatory Visit: Payer: Self-pay

## 2018-12-30 DIAGNOSIS — C61 Malignant neoplasm of prostate: Secondary | ICD-10-CM | POA: Diagnosis not present

## 2018-12-30 DIAGNOSIS — R310 Gross hematuria: Secondary | ICD-10-CM | POA: Diagnosis not present

## 2018-12-30 NOTE — Patient Outreach (Signed)
[facility] [facility]) Care Management  12/30/2018  Corey Oneill Oct 06, 1936 [id number]    EMMI- General Discharge RED ON EMMI ALERT Day # 1 Date: 12/28/2018 Red Alert Reason: " Unfilled prescriptions? Yes  Able to fill today/tomorrow? No"   Outreach attempt # 1 to patient.No answer. RN CM left HIPAA compliant voicemail message along with contact info.       Plan: RN CM will make outreach attempt to patient within 3-4 business days. RN CM will send unsuccessful outreach letter to patient.  Enzo Montgomery, RN,BSN,CCM [place] [facility] Telephonic Care Management Coordinator Direct Phone: [phone] Toll Free: [phone] Fax: [phone]

## 2018-12-31 ENCOUNTER — Other Ambulatory Visit: Payer: Self-pay

## 2018-12-31 DIAGNOSIS — C61 Malignant neoplasm of prostate: Secondary | ICD-10-CM | POA: Diagnosis not present

## 2018-12-31 DIAGNOSIS — R310 Gross hematuria: Secondary | ICD-10-CM | POA: Diagnosis not present

## 2018-12-31 NOTE — Patient Outreach (Signed)
[facility]) Care Management  12/31/2018  ARNOL MCGIBBON Dec 16, 1935 [id number]   EMMI- General Discharge RED ON EMMI ALERT Day # 1 Date: 12/28/2018 Red Alert Reason: " Unfilled prescriptions? Yes  Able to fill today/tomorrow? No"   Outreach attempt #2 to patient. A male answered the phone and loudly shouted "what." When RN CM asked to speak with patient by name the caller hung up.      Plan: RN CM will make outreach attempt to patient within 3-4 business days.   Enzo Montgomery, RN,BSN,CCM [place] [facility] Telephonic Care Management Coordinator Direct Phone: [phone] Toll Free: [phone] Fax: [phone]

## 2019-01-01 ENCOUNTER — Other Ambulatory Visit: Payer: Self-pay

## 2019-01-01 NOTE — Patient Outreach (Signed)
[facility] [place] [facility]) Care Management  01/01/2019  MARCIAL PLESS June 24, 1936 [id number]   EMMI-General Discharge RED ON EMMI ALERT Day #1 Date:12/28/2018 Red Alert Reason:" Unfilled prescriptions? Yes Able to fill today/tomorrow? No"     Outreach attempt #3 to patient. No answer at present.     Plan: RN CM will close case if no response from letter mailed to patient.   Enzo Montgomery, RN,BSN,CCM [place] [facility] Telephonic Care Management Coordinator Direct Phone: [phone] Toll Free: [phone] Fax: [phone]

## 2019-01-02 DIAGNOSIS — F320 Major depressive disorder, single episode, mild: Secondary | ICD-10-CM | POA: Diagnosis not present

## 2019-01-06 DIAGNOSIS — N3040 Irradiation cystitis without hematuria: Secondary | ICD-10-CM | POA: Diagnosis not present

## 2019-01-06 DIAGNOSIS — R351 Nocturia: Secondary | ICD-10-CM | POA: Diagnosis not present

## 2019-01-06 DIAGNOSIS — C61 Malignant neoplasm of prostate: Secondary | ICD-10-CM | POA: Diagnosis not present

## 2019-01-08 ENCOUNTER — Encounter (HOSPITAL_COMMUNITY)
Admission: RE | Admit: 2019-01-08 | Discharge: 2019-01-08 | Disposition: A | Discharge status: Home / Self Care | Payer: Medicare Other | Source: Ambulatory Visit | Attending: Urology | Admitting: Urology

## 2019-01-08 ENCOUNTER — Ambulatory Visit (HOSPITAL_COMMUNITY)
Admission: RE | Admit: 2019-01-08 | Discharge: 2019-01-08 | Disposition: A | Discharge status: Home / Self Care | Payer: Medicare Other | Source: Ambulatory Visit | Attending: Urology | Admitting: Urology

## 2019-01-08 DIAGNOSIS — C61 Malignant neoplasm of prostate: Secondary | ICD-10-CM | POA: Diagnosis not present

## 2019-01-08 DIAGNOSIS — R972 Elevated prostate specific antigen [PSA]: Secondary | ICD-10-CM | POA: Diagnosis not present

## 2019-01-08 MED ORDER — TECHNETIUM TC 99M MEDRONATE IV KIT
20.9000 | PACK | Freq: Once | INTRAVENOUS | Status: AC | PRN
  Administered 2019-01-08: 20.9 via INTRAVENOUS

## 2019-01-13 ENCOUNTER — Other Ambulatory Visit: Payer: Self-pay

## 2019-01-13 NOTE — Patient Outreach (Signed)
[facility]) Care Management  01/13/2019  CAELLUM MANCIL [birth year] [id number]   EMMI-General Discharge RED ON EMMI ALERT Day #1 Date:12/28/2018 Red Alert Reason:" Unfilled prescriptions? Yes Able to fill today/tomorrow? No"    Multiple attempts to establish contact with patient without success. No response from letter mailed to patient. Case is being closed at this time.      Plan: RN CM will close case at this time.   Enzo Montgomery, RN,BSN,CCM [place] [facility] Telephonic Care Management Coordinator Direct Phone: [phone] Toll Free: [phone] Fax: [phone]

## 2019-01-14 ENCOUNTER — Inpatient Hospital Stay: Payer: Medicare Other

## 2019-01-14 ENCOUNTER — Inpatient Hospital Stay: Payer: Medicare Other | Attending: Oncology | Admitting: Oncology

## 2019-01-14 ENCOUNTER — Telehealth: Payer: Self-pay | Admitting: Oncology

## 2019-01-14 VITALS — BP 133/74 | HR 80 | Temp 98.3°F | Resp 18 | Ht 70.0 in | Wt 156.0 lb

## 2019-01-14 DIAGNOSIS — Z862 Personal history of diseases of the blood and blood-forming organs and certain disorders involving the immune mechanism: Secondary | ICD-10-CM

## 2019-01-14 DIAGNOSIS — Z192 Hormone resistant malignancy status: Secondary | ICD-10-CM | POA: Insufficient documentation

## 2019-01-14 DIAGNOSIS — D693 Immune thrombocytopenic purpura: Secondary | ICD-10-CM | POA: Insufficient documentation

## 2019-01-14 DIAGNOSIS — C4359 Malignant melanoma of other part of trunk: Secondary | ICD-10-CM | POA: Insufficient documentation

## 2019-01-14 DIAGNOSIS — C7951 Secondary malignant neoplasm of bone: Secondary | ICD-10-CM | POA: Diagnosis not present

## 2019-01-14 DIAGNOSIS — Z923 Personal history of irradiation: Secondary | ICD-10-CM | POA: Diagnosis not present

## 2019-01-14 DIAGNOSIS — Z79899 Other long term (current) drug therapy: Secondary | ICD-10-CM

## 2019-01-14 DIAGNOSIS — C61 Malignant neoplasm of prostate: Secondary | ICD-10-CM

## 2019-01-14 LAB — CBC WITH DIFFERENTIAL (CANCER CENTER ONLY)
Abs Immature Granulocytes: 0.04 10*3/uL (ref 0.00–0.07)
BASOS ABS: 0 10*3/uL (ref 0.0–0.1)
Basophils Relative: 0 %
EOS PCT: 2 %
Eosinophils Absolute: 0.1 10*3/uL (ref 0.0–0.5)
HCT: 28.9 % — ABNORMAL LOW (ref 39.0–52.0)
Hemoglobin: 9.2 g/dL — ABNORMAL LOW (ref 13.0–17.0)
Immature Granulocytes: 1 %
LYMPHS PCT: 35 %
Lymphs Abs: 1.1 10*3/uL (ref 0.7–4.0)
MCH: 31.8 pg (ref 26.0–34.0)
MCHC: 31.8 g/dL (ref 30.0–36.0)
MCV: 100 fL (ref 80.0–100.0)
Monocytes Absolute: 0.5 10*3/uL (ref 0.1–1.0)
Monocytes Relative: 16 %
Neutro Abs: 1.4 10*3/uL — ABNORMAL LOW (ref 1.7–7.7)
Neutrophils Relative %: 46 %
Platelet Count: 60 10*3/uL — ABNORMAL LOW (ref 150–400)
RBC: 2.89 MIL/uL — ABNORMAL LOW (ref 4.22–5.81)
RDW: 14 % (ref 11.5–15.5)
WBC Count: 3.1 10*3/uL — ABNORMAL LOW (ref 4.0–10.5)
nRBC: 0 % (ref 0.0–0.2)

## 2019-01-14 NOTE — Telephone Encounter (Signed)
Scheduled per los. Mailed printout  °

## 2019-01-14 NOTE — Progress Notes (Signed)
Hematology and Oncology Follow Up Visit  Corey Oneill [id number] [birth year] 83 y.o. 01/14/2019 3:38 PM Corey Oneill, MDLalonde, Corey Jarvis, MD   Principle Diagnosis: 83 year old man with:  1.  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  He was found to have 0.39 mm Clark's level was IV superficial spreading melanoma T1 lesion.  2. 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  3. Prostate cancer: He is status post radiation therapy in the past.  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  He has isolated bony metastasis.  Prior Therapy:   He is status post wide excision of his melanoma in December 2017.  Prednisone therapy currently at 60 mg daily started in February 2018 and was tapered to off in May 2018.  Current therapy:   Lupron every 4 months under the care of Dr. Junious Silk. Under consideration for additional therapy for his prostate cancer.  Active surveillance for multiple myeloma and thrombocytopenia.  Interim History:  Mr. Corey Oneill returns today for a follow-up visit.  Since the last visit, he has developed recurrent hematuria and his evaluation by Dr. Junious Silk showed inflammation and radiation cystitis without any evidence of any tumor or malignancy.  His hematuria has resolved at this time.  He is a PSA continues to rise and has been receiving Lupron 30 mg every 4 months.  He is asymptomatic from these findings although he did have a bone scan that was positive for isolated metastasis.  He denies any additional bleeding or bruising at this time.  He denies any recent hospitalizations.  Patient denied any alteration mental status, neuropathy, confusion or dizziness.  Denies any headaches or lethargy.  Denies any night sweats, weight loss or changes in appetite.  Denied orthopnea, dyspnea on exertion or chest discomfort.  Denies shortness of breath, difficulty breathing hemoptysis or cough.  Denies any abdominal distention, nausea,  early satiety or dyspepsia.  Denies any hematuria, frequency, dysuria or nocturia.  Denies any skin irritation, dryness or rash.  Denies any ecchymosis or petechiae.  Denies any lymphadenopathy or clotting.  Denies any heat or cold intolerance.  Denies any anxiety or depression.  Remaining review of system is negative.       Medications: I have reviewed the patient's current medications.  Current Outpatient Medications  Medication Sig Dispense Refill  . alfuzosin (UROXATRAL) 10 MG 24 hr tablet Take 10 mg by mouth daily with breakfast.    . carvedilol (COREG) 3.125 MG tablet Take 1 tablet (3.125 mg total) by mouth 2 (two) times daily. 60 tablet 11  . DULoxetine (CYMBALTA) 30 MG capsule Take 1 capsule (30 mg total) by mouth daily. 7 capsule 0  . ferrous sulfate 325 (65 FE) MG EC tablet Take 1 tablet (325 mg total) by mouth daily with breakfast. If no side effects, change to twice daily after 1 week. 30 tablet 2  . isosorbide mononitrate (IMDUR) 30 MG 24 hr tablet Take 1 tablet (30 mg total) by mouth daily. 30 tablet 11  . MULTIPLE VITAMIN PO Take 1 tablet by mouth daily.    . nitroGLYCERIN (NITROSTAT) 0.4 MG SL tablet 1 TAB UNDER TONGUE AS NEEDED FOR CHEST PAIN. MAY REPEAT EVERY 5 MIN FOR A TOTAL OF 3 DOSES. (Patient taking differently: Place 0.4 mg under the tongue every 5 (five) minutes as needed for chest pain. ) 25 tablet 4  . rosuvastatin (CRESTOR) 10 MG tablet TAKE 1 TABLET ONCE DAILY. (Patient taking differently: Take 10 mg by  mouth daily. ) 90 tablet 0  . vortioxetine HBr (TRINTELLIX) 5 MG TABS tablet Take 1 tablet (5 mg total) by mouth daily. (Patient not taking: Reported on 12/22/2018) 14 tablet 0   No current facility-administered medications for this visit.      Allergies: No Known Allergies  Past Medical History, Surgical history, Social history, and Family History were reviewed and updated.  Physical Exam: Blood pressure 133/74, pulse 80, temperature 98.3 F (36.8 C),  temperature source Oral, resp. rate 18, height _0  (1.778 m), weight 156 lb (70.8 kg), SpO2 100 %.   ECOG: 0    General appearance: Comfortable appearing without any discomfort Head: Normocephalic without any trauma Oropharynx: Mucous membranes are moist and pink without any thrush or ulcers. Eyes: Pupils are equal and round reactive to light. Lymph nodes: No cervical, supraclavicular, inguinal or axillary lymphadenopathy.   Heart:regular rate and rhythm.  S1 and S2 without leg edema. Oneill: Clear without any rhonchi or wheezes.  No dullness to percussion. Abdomin: Soft, nontender, nondistended with good bowel sounds.  No hepatosplenomegaly. Musculoskeletal: No joint deformity or effusion.  Full range of motion noted. Neurological: No deficits noted on motor, sensory and deep tendon reflex exam. Skin: No petechial rash or dryness.  Appeared moist.  Psychiatric: Mood and affect appeared appropriate.    Lab Results: Lab Results  Component Value Date   WBC 3.1 (L) 01/14/2019   HGB 9.2 (L) 01/14/2019   HCT 28.9 (L) 01/14/2019   MCV 100.0 01/14/2019   PLT PENDING 01/14/2019     Chemistry      Component Value Date/Time   NA 134 (L) 12/24/2018 0428   NA 138 09/26/2018 1031   NA 141 09/21/2017 1101   K 4.3 12/24/2018 0428   K 4.4 09/21/2017 1101   CL 103 12/24/2018 0428   CO2 22 12/24/2018 0428   CO2 29 09/21/2017 1101   BUN 12 12/24/2018 0428   BUN 16 09/26/2018 1031   BUN 18.7 09/21/2017 1101   CREATININE 0.66 12/24/2018 0428   CREATININE 0.9 09/21/2017 1101      Component Value Date/Time   CALCIUM 8.0 (L) 12/24/2018 0428   CALCIUM 8.7 09/21/2017 1101   ALKPHOS 79 09/21/2017 1101   AST 22 09/21/2017 1101   ALT 16 09/21/2017 1101   BILITOT 0.70 09/21/2017 1101     PSA in December 04, 2018 was 5.5 which increased from 3.01 in August 2019.  His testosterone is castrate level.  EXAM: NUCLEAR MEDICINE WHOLE BODY BONE SCAN  TECHNIQUE: Whole body anterior and  posterior images were obtained approximately 3 hours after intravenous injection of radiopharmaceutical.  RADIOPHARMACEUTICALS:  20.9 mCi Technetium-22mMDP IV  COMPARISON:  05/26/2016  Correlation: Pelvic/LEFT hip radiographs 12/24/2018, CT abdomen and pelvis 12/09/2018  FINDINGS: Focus of abnormal significantly increased tracer localization is identified at the LEFT supra-acetabular region; this corresponds to the sclerotic focus identified by CT and is consistent with osseous metastatic disease.  Patient motion artifacts at proximal LEFT femur and LEFT hand.  Uptake at the shoulders, elbows, sternoclavicular joints, and knees, typically degenerative.  No additional sites of abnormal osseous tracer accumulation are identified.  Expected urinary tract and soft tissue distribution of tracer.  IMPRESSION: Intense increased tracer accumulation at LEFT supra-acetabular region corresponding to sclerotic focus on CT and consistent with osseous metastatic disease.  No additional osseous metastases identified.    Impression and Plan:  83year old gentleman with the following issues:  1.  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  He was initially diagnosed in 2014 and treated with radiation therapy utilizing IMRT.  He developed recurrent disease with PSA rise only and treated with androgen deprivation therapy.  In January 2020 his PSA was up to 5.5 with testosterone level that is castrate while on androgen deprivation therapy.  The natural course of this disease was discussed today with the patient.  Additional therapy includes apalutamide, Nicki Reaper as well as systemic chemotherapy as well as radiation therapy for his oligoprogression.  He has informed me that Dr. Junious Silk is attempting to start him on on oral therapy.  I will discuss this with Dr. Junious Silk and will coordinate our attempts to start him on  therapy for his castration resistant disease.  If Dr. Lyndal Rainbow office is unable to secure funds to cover his apalutamide, we will attempt to start a different medication at this time.  2.  Cutaneous melanoma of the chest wall: This has been resected without any evidence of recurrent disease.  3. 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  Platelet count is adequate today at 60 without any active bleeding.  4.  Follow-up: Will be in the next 4 to 6 weeks depending on plan to treat his prostate cancer.  25  minutes was spent with the patient face-to-face today.  More than 50% of time was dedicated to reviewing laboratory data, disease status update, treatment options and answering questions regarding future plan of care.     Zola Button, MD 3/3/20203:38 PM

## 2019-01-15 ENCOUNTER — Telehealth: Payer: Self-pay

## 2019-01-15 MED ORDER — AMOXICILLIN 500 MG PO TABS: ORAL_TABLET | ORAL | 6 refills | Status: DC

## 2019-01-15 NOTE — Telephone Encounter (Signed)
Hi Corey Oneill, pt just left his dentist office and they want him to take 500 mg of Amoxicillin. He just wants to know if it's ok to take it.   Dentist name is: Cottie Banda, [phone].  Please advise. Thanks

## 2019-01-15 NOTE — Telephone Encounter (Signed)
Reiterated to Corey Oneill that he should be taking Amoxil 2,000 mg one hour prior to all dental visits because of his TAVR.  He was grateful for call and agrees with treatment plan.   Rx sent to [facility].

## 2019-01-20 DIAGNOSIS — R310 Gross hematuria: Secondary | ICD-10-CM | POA: Diagnosis not present

## 2019-01-20 DIAGNOSIS — R311 Benign essential microscopic hematuria: Secondary | ICD-10-CM | POA: Diagnosis not present

## 2019-01-20 DIAGNOSIS — C61 Malignant neoplasm of prostate: Secondary | ICD-10-CM | POA: Diagnosis not present

## 2019-01-28 ENCOUNTER — Ambulatory Visit: Payer: Medicare Other | Admitting: Family Medicine

## 2019-02-03 ENCOUNTER — Ambulatory Visit (INDEPENDENT_AMBULATORY_CARE_PROVIDER_SITE_OTHER): Payer: Medicare Other | Admitting: Family Medicine

## 2019-02-03 ENCOUNTER — Other Ambulatory Visit: Payer: Self-pay

## 2019-02-03 ENCOUNTER — Encounter: Payer: Self-pay | Admitting: Family Medicine

## 2019-02-03 VITALS — BP 120/78 | HR 71 | Temp 97.7°F | Wt 158.4 lb

## 2019-02-03 DIAGNOSIS — I25119 Atherosclerotic heart disease of native coronary artery with unspecified angina pectoris: Secondary | ICD-10-CM

## 2019-02-03 DIAGNOSIS — S90424A Blister (nonthermal), right lesser toe(s), initial encounter: Secondary | ICD-10-CM | POA: Diagnosis not present

## 2019-02-03 NOTE — Progress Notes (Signed)
   Subjective:    Patient ID: Corey Oneill, male    DOB: December 13, 1935, 83 y.o.   MRN: [id number]  HPI He is here for evaluation of a blister on the right great toe.  He noted this after he was using some shoes that had broken down in the area where he is having the blister.  The blistering is uncomfortable but not draining   Review of Systems     Objective:   Physical Exam Alert and in no distress.  2 blisters noted on the right great toe and over the plantar surface proximal to the great toe.  They are intact and there is no associated erythema.       Assessment & Plan:  Blister of toe of right foot, initial encounter Recommend supportive care with appropriate cushioning and avoiding wearing tight fitting shoes.  Explained that this should slowly go away.  He will call if he has further trouble.

## 2019-02-06 ENCOUNTER — Telehealth: Payer: Self-pay

## 2019-02-06 DIAGNOSIS — F320 Major depressive disorder, single episode, mild: Secondary | ICD-10-CM | POA: Diagnosis not present

## 2019-02-06 NOTE — Telephone Encounter (Signed)
Called pt to advise of webex  App to be downloaded to cell phone pt was int he store and will need help doing so.  Pt needs to open play or apple store on phone and look up [employer] and download to cell phone. On appt day he will get a call from [place] before appt time . After that pt will get an e-mail from Dr. Redmond School to join and he will click on join and wait until he can see Dr. Redmond School. Pt will also need to get any vitals he can such as weight and temp. [place]

## 2019-02-12 ENCOUNTER — Encounter: Payer: Self-pay | Admitting: Podiatry

## 2019-02-12 ENCOUNTER — Other Ambulatory Visit: Payer: Self-pay | Admitting: Podiatry

## 2019-02-12 ENCOUNTER — Ambulatory Visit (INDEPENDENT_AMBULATORY_CARE_PROVIDER_SITE_OTHER): Payer: Medicare Other | Admitting: Podiatry

## 2019-02-12 ENCOUNTER — Ambulatory Visit (INDEPENDENT_AMBULATORY_CARE_PROVIDER_SITE_OTHER): Payer: Medicare Other

## 2019-02-12 ENCOUNTER — Other Ambulatory Visit: Payer: Self-pay

## 2019-02-12 VITALS — BP 143/76 | HR 62 | Temp 96.2°F | Resp 16

## 2019-02-12 DIAGNOSIS — M7752 Other enthesopathy of left foot: Secondary | ICD-10-CM | POA: Diagnosis not present

## 2019-02-12 DIAGNOSIS — B351 Tinea unguium: Secondary | ICD-10-CM

## 2019-02-12 DIAGNOSIS — M79674 Pain in right toe(s): Secondary | ICD-10-CM | POA: Diagnosis not present

## 2019-02-12 DIAGNOSIS — M79672 Pain in left foot: Secondary | ICD-10-CM | POA: Diagnosis not present

## 2019-02-12 DIAGNOSIS — I25119 Atherosclerotic heart disease of native coronary artery with unspecified angina pectoris: Secondary | ICD-10-CM | POA: Diagnosis not present

## 2019-02-12 DIAGNOSIS — M79675 Pain in left toe(s): Secondary | ICD-10-CM

## 2019-02-12 DIAGNOSIS — M779 Enthesopathy, unspecified: Secondary | ICD-10-CM

## 2019-02-12 DIAGNOSIS — M79671 Pain in right foot: Secondary | ICD-10-CM | POA: Diagnosis not present

## 2019-02-12 DIAGNOSIS — M7751 Other enthesopathy of right foot: Secondary | ICD-10-CM | POA: Diagnosis not present

## 2019-02-12 NOTE — Progress Notes (Signed)
   Subjective:    Patient ID: Corey Oneill, male    DOB: [birth year], 83 y.o.   MRN: [id number]  HPI    Review of Systems  All other systems reviewed and are negative.      Objective:   Physical Exam        Assessment & Plan:

## 2019-02-12 NOTE — Progress Notes (Signed)
Subjective:   Patient ID: Corey Oneill, male   DOB: 83 y.o.   MRN: [id number]   HPI Patient presents with trauma to the right over left first hallux and second digit with also nail disease 1-5 both feet that get incurvated and he cannot cut.  Patient does not smoke currently and likes to be active if possible   Review of Systems  All other systems reviewed and are negative.       Objective:  Physical Exam Vitals signs and nursing note reviewed.  Constitutional:      Appearance: He is well-developed.  Pulmonary:     Effort: Pulmonary effort is normal.  Musculoskeletal: Normal range of motion.  Skin:    General: Skin is warm.  Neurological:     Mental Status: He is alert.     Neurovascular status was found to be intact with range of motion adequate for his age and muscle strength within normal limits.  Patient does have structural hammertoe deformity bilateral and has blistering which is occurred on the hallux right over left that is localized in nature with no current drainage.  Patient has thick yellow brittle nailbeds 1-5 both feet that he cannot cut     Assessment:  Traumatized foot right over left with structural hammertoe bunion deformities with mycotic nail infection with pain 1-5 both feet     Plan:  I went ahead today did H&P x-rays and reviewed condition.  Today I debrided nailbeds 1-5 both feet with no iatrogenic bleeding and patient will be seen back for routine care  X-rays indicate significant structural digital deformities bilateral but I did not note any damage secondary to the trauma the patient is sustained to his feet right over left.

## 2019-02-13 ENCOUNTER — Other Ambulatory Visit: Payer: Self-pay

## 2019-02-13 ENCOUNTER — Ambulatory Visit (INDEPENDENT_AMBULATORY_CARE_PROVIDER_SITE_OTHER): Payer: Medicare Other | Admitting: Family Medicine

## 2019-02-13 ENCOUNTER — Encounter: Payer: Self-pay | Admitting: Family Medicine

## 2019-02-13 DIAGNOSIS — M79671 Pain in right foot: Secondary | ICD-10-CM

## 2019-02-13 DIAGNOSIS — C61 Malignant neoplasm of prostate: Secondary | ICD-10-CM

## 2019-02-13 DIAGNOSIS — F341 Dysthymic disorder: Secondary | ICD-10-CM

## 2019-02-13 DIAGNOSIS — I25119 Atherosclerotic heart disease of native coronary artery with unspecified angina pectoris: Secondary | ICD-10-CM

## 2019-02-13 DIAGNOSIS — E785 Hyperlipidemia, unspecified: Secondary | ICD-10-CM | POA: Diagnosis not present

## 2019-02-13 MED ORDER — ROSUVASTATIN CALCIUM 10 MG PO TABS: 10.0000 mg | ORAL_TABLET | Freq: Every day | ORAL | 0 refills | Status: DC

## 2019-02-13 NOTE — Progress Notes (Signed)
Documentation for virtual telephone encounter.  Documentation for virtual audio and video telecommunications through Zoom encounter:    The patient was located at home. The provider was located in the office. The patient did consent to this visit and is aware of possible charges through their insurance for this visit.  The other persons participating in this telemedicine service were his daughter  This virtual service is not related to other E/M service within previous 7 days.    Subjective:    Patient ID: YOUSSOUF SHIPLEY, male    DOB: 01/13/1936, 83 y.o.   MRN: [id number]  HPI The Zoom visit is for follow-up concerning multiple issues. He was recently seen by Dr. Paulla Dolly who also looked at the blisters and said they are healing nicely.  He is also taking Uroxatrol but is still having difficulty with urinary issues at night.  He has seen urology in the past for this and has had difficulty with other medications including Myrbetriq and another medication.  He has not been taking his Trintellix but states that psychologically he is feeling quite good.  There was a question of whether he was taking his meds correctly and I therefore went over the medications with him and with his daughter.  I had concerns over whether he truly stopped the Cymbalta which he did.  In reviewing his medications he is about to run out of Crestor and has not had blood work in over a year.  Review of Systems     Objective:   Physical Exam Alert and in no distress otherwise not examined       Assessment & Plan:  Dysthymia  Acute pain of right foot  Hyperlipidemia, unspecified hyperlipidemia type  Prostate cancer ([place])  Since he is doing fairly well with not being on any psychotropics, we will hold off on any medications at the present time.  His foot seems to be doing okay so we will not do anything with that.  He will continue on the Uroxatrol but I recommend that he cut back on his fluids and empty her  bladder before he goes to bed.  If he has further trouble he is to call me and I will probably then refer him back to urology. I will also renew his Crestor and recommend he come back for blood work after the coronavirus pandemic is over. Over 25 minutes, the entire time spent in counseling and coordination of care.

## 2019-02-17 ENCOUNTER — Telehealth: Payer: Self-pay | Admitting: Oncology

## 2019-02-17 NOTE — Telephone Encounter (Signed)
Per 4/2 schedule message 4/16 f/u to be done via telephone. Per FS cancelled lab. Spoke with patient.

## 2019-02-24 ENCOUNTER — Ambulatory Visit (INDEPENDENT_AMBULATORY_CARE_PROVIDER_SITE_OTHER): Payer: Medicare Other | Admitting: Family Medicine

## 2019-02-24 ENCOUNTER — Other Ambulatory Visit: Payer: Self-pay

## 2019-02-24 ENCOUNTER — Encounter: Payer: Self-pay | Admitting: Family Medicine

## 2019-02-24 VITALS — Ht 70.0 in | Wt 155.0 lb

## 2019-02-24 DIAGNOSIS — F341 Dysthymic disorder: Secondary | ICD-10-CM | POA: Diagnosis not present

## 2019-02-24 DIAGNOSIS — T148XXA Other injury of unspecified body region, initial encounter: Secondary | ICD-10-CM | POA: Diagnosis not present

## 2019-02-24 DIAGNOSIS — C61 Malignant neoplasm of prostate: Secondary | ICD-10-CM | POA: Diagnosis not present

## 2019-02-24 DIAGNOSIS — I25119 Atherosclerotic heart disease of native coronary artery with unspecified angina pectoris: Secondary | ICD-10-CM | POA: Diagnosis not present

## 2019-02-24 DIAGNOSIS — D696 Thrombocytopenia, unspecified: Secondary | ICD-10-CM | POA: Diagnosis not present

## 2019-02-24 MED ORDER — VORTIOXETINE HBR 5 MG PO TABS: 5.0000 mg | ORAL_TABLET | Freq: Every day | ORAL | 0 refills | Status: DC

## 2019-02-24 NOTE — Addendum Note (Signed)
Addended by: [name] on: 02/24/2019 02:06 PM   Modules accepted: Orders

## 2019-02-24 NOTE — Progress Notes (Signed)
Start time: 11:25 End time: 11:55  Virtual Visit via Video Note  I connected with Corey Oneill on 02/24/19 at 11:15 AM EDT by a video enabled telemedicine application (he was unable to work [employer], so we ended up using FaceTime), and verified that I am speaking with the correct person using two identifiers. Patient is at home (wife is in another room). MD is on office.  I discussed the limitations of evaluation and management by telemedicine and the availability of in person appointments. The patient expressed understanding and agreed to proceed.  History of Present Illness:  Chief Complaint  Patient presents with  . Blister    blood blisters on his hands, had on feet before that Dr.[name] look at in the past.    First noted blisters on the hands about 2 weeks ago.  He has developed more of them, and the one on the right 4th finger has gotten much larger. He denies any pain from this blister. He reports he had one on the left hand between his thumb and index finger, which is healing, and notes one discolored spot on his left thumb (not itchy or painful), but otherwise they are mostly on his right hand now. The blister on the palmar aspect of the right ring finger has grown in size, but hasn't changed in the last 1-2 days. He denies pain.  This is a blood blister, as is the one on his right thumb.  Denies change in activity, or burns. Doesn't do yardwork or other repetitive activity for a blister to form. Not itchy not painful. No new contacts or medications.  He reports he had a similar blister start on his foot, for which he saw Dr. Redmond School on 3/23.  That was felt to be related to a shoe.  He reports that drained, scabbed, and is now open and painful.  Prev saw Dr. Allyson Sabal, has since seen Dr. Pearline Cables, not recently. He saw Dr. Paulla Dolly 4/1 and had his nailbeds debrided.  Chart reviewed--he has low platelets, last was 60 on 01/14/2019 PMH reviewed, and medications.  Outpatient Encounter  Medications as of 02/24/2019  Medication Sig Note  . aspirin EC 81 MG tablet Take 81 mg by mouth daily.   . carvedilol (COREG) 3.125 MG tablet Take 1 tablet (3.125 mg total) by mouth 2 (two) times daily.   . ferrous sulfate 325 (65 FE) MG EC tablet Take 1 tablet (325 mg total) by mouth daily with breakfast. If no side effects, change to twice daily after 1 week. 02/24/2019: Taking it once daily  . isosorbide mononitrate (IMDUR) 30 MG 24 hr tablet Take 1 tablet (30 mg total) by mouth daily.   . MULTIPLE VITAMIN PO Take 1 tablet by mouth daily.   . rosuvastatin (CRESTOR) 10 MG tablet Take 1 tablet (10 mg total) by mouth daily.   [name] vortioxetine HBr (TRINTELLIX) 5 MG TABS tablet Take 1 tablet (5 mg total) by mouth daily. 02/24/2019: taking  . amoxicillin (AMOXIL) 500 MG tablet Take 4 tablets (2,000 mg) one hour prior to ALL dental visits. (Patient not taking: Reported on 02/24/2019)   . nitroGLYCERIN (NITROSTAT) 0.4 MG SL tablet 1 TAB UNDER TONGUE AS NEEDED FOR CHEST PAIN. MAY REPEAT EVERY 5 MIN FOR A TOTAL OF 3 DOSES. (Patient not taking: No sig reported)   . [DISCONTINUED] alfuzosin (UROXATRAL) 10 MG 24 hr tablet Take 10 mg by mouth daily with breakfast.   . [DISCONTINUED] ERLEADA 60 MG tablet    . [DISCONTINUED]  sulfamethoxazole-trimethoprim (BACTRIM DS,SEPTRA DS) 800-160 MG tablet     No facility-administered encounter medications on file as of 02/24/2019.    No Known Allergies  ROS: no fever, chills, URI symptoms, cough, shortness of breath. No other bleeding/bruising, just as noted in HPI (blood blisters).   Observations/Objective:  Ht 5\' 10"  (1.778 m)   Wt 155 lb (70.3 kg)   BMI 22.24 kg/m   Examination limited due to virtual nature of the visit. He appears to be a pleasant, elderly gentleman, in no distress. He is alert and oriented. He has a vesicle filled with pink fluid at the palmar aspect of the right distal phalanx of the ring finger, approximately 2/3 the size of the phalanx.   He reports he has a spot between right index finger and middle finger--unable to visualize this area. The palmar aspect of the right index finger has 3 violaceous/red spots, which appear flat, and one located at the PIP. There is a blister (also blood-filled) at the right thumb as well, small. Left thumb, near tip, there is a violaceous/reddish discoloration that appears flat.  Right great toe--open/denuded area on the bottom.  None of these areas have surrounding erythema, soft tissue swelling, and he denies them being tender to palpation.  Lab Results  Component Value Date   WBC 3.1 (L) 01/14/2019   HGB 9.2 (L) 01/14/2019   HCT 28.9 (L) 01/14/2019   MCV 100.0 01/14/2019   PLT 60 (L) 01/14/2019    Assessment and Plan:  Blood blister - supportive measures reviewed. s/sx of infection reviewed. May be related to low plts. May need to f/u with derm if worsening blister formation  Thrombocytopenia ([place])  Prostate cancer ([place]) - under the care of Dr. Alen Blew  Patient states he only has 2 samples left of Trintellix.   Follow Up Instructions:  Will send note to Dr. Alen Blew, has upcoming visit.    I discussed the assessment and treatment plan with the patient. The patient was provided an opportunity to ask questions and all were answered. The patient agreed with the plan and demonstrated an understanding of the instructions.   The patient was advised to call back or seek an in-person evaluation if the symptoms worsen or if the condition fails to improve as anticipated.  I provided 30 minutes of non-face-to-face time during this encounter.  This virtual service is not related to other E/M service within previous 7 days.  Vikki Ports, MD

## 2019-02-24 NOTE — Patient Instructions (Addendum)
I reviewed your chart further, and it is possible that the spots that are developing are related to your platelets being low (was 60 on your last check last month).  I'm forwarding today's visit to Dr. Alen Blew so that he is aware.  We discussed using antibacterial ointment (such as neosporin or bacitracin) to the open area on the great toe, as well as covering with a bandage to protect it and help decrease pain. If you develop and redness, swelling, warmth or increased pain, or any crusting or drainage, then it may be a sign of infection and needs further evaluation and treatment.  We discussed leaving the blister intact (and only to try and drain it if it becomes very tight and painful--if this is necessary, we discussed cleaning with alcohol, sterilizing a pin by heating it under flame, letting it cool, and then wiping with alcohol, prior to a small puncture, just enough to puncture the outer surface of the blister and drain fluid. We discussed leaving the loose skin intact, not peeling it back).  I'm sending a note to Dr. Redmond School about your Trintellix (so that he can send in a prescription and see when follow-up is needed).   Blisters, Adult  A blister is a raised bubble of skin filled with liquid. Blisters often develop in an area of the skin that repeatedly rubs or presses against another surface (friction blister). Friction blisters can occur on any part of the body, but they usually develop on the hands or feet. Long-term pressure on the same area of the skin can also lead to areas of hardened skin (calluses). What are the causes? A blister can be caused by:  An injury.  A burn.  An allergic reaction.  An infection.  Exposure to irritating chemicals.  Friction, especially in an area with a lot of heat and moisture. Friction blisters often result from:  Sports.  Repetitive activities.  Using tools and doing other activities without wearing gloves.  Shoes that are too tight or  too loose. What are the signs or symptoms? A blister is often round and looks like a bump. It may:  Itch.  Be painful to the touch. Before a blister forms, the skin may:  Become red.  Feel warm.  Itch.  Be painful to the touch. How is this diagnosed? A blister is diagnosed with a physical exam. How is this treated? Treatment usually involves protecting the area where the blister has formed until the skin has healed. Other treatments may include:  A bandage (dressing) to cover the blister.  Extra padding around and over the blister, so that it does not rub on anything.  Antibiotic ointment. Most blisters break open, dry up, and go away on their own within 1-2 weeks. Blisters that are very painful may be drained before they break open on their own. If the blister is large or painful, it can be drained by: 1. Sterilizing a small needle with rubbing alcohol. 2. Washing your hands with soap and water. 3. Inserting the needle in the edge of the blister to make a small hole. Some fluid will drain out of the hole. Let the top or roof of the blister stay in place. This helps the skin heal. 4. Washing the blister with mild soap and water. 5. Covering the blister with antibiotic ointment, if prescribed by your health care provider, and a dressing. Some blisters may need to be drained by a health care provider. Follow these instructions at home:  Protect the  area where the blister has formed as told by your health care provider.  Keep your blister clean and dry. This helps to prevent infection.  Do not pop your blister. This can cause infection.  If you were prescribed an antibiotic, use it as told by your health care provider. Do not stop using the antibiotic even if your condition improves.  Wear different shoes until the blister heals.  Avoid the activity that caused the blister until your blister heals.  Check your blister every day for signs of infection. Check for: ? More  redness, swelling, or pain. ? More fluid or blood. ? Warmth. ? Pus or a bad smell. ? The blister getting better and then getting worse. How is this prevented? Taking these steps can help to prevent blisters that are caused by friction. Make sure you:  Wear comfortable shoes that fit well.  Always wear socks with shoes.  Wear extra socks or use tape, bandages, or pads over blister-prone areas as needed. You may also apply petroleum jelly under bandages in blister-prone areas.  Wear protective gear, such as gloves, when participating in sports or activities that can cause blisters.  Wear loose-fitting, moisture-wicking clothes when participating in sports or activities.  Use powders as needed to keep your feet dry. Contact a health care provider if:  You have more redness, swelling, or pain around your blister.  You have more fluid or blood coming from your blister.  Your blister feels warm to the touch.  You have pus or a bad smell coming from your blister.  You have a fever or chills.  Your blister gets better and then it gets worse. This information is not intended to replace advice given to you by your health care provider. Make sure you discuss any questions you have with your health care provider. Document Released: 12/07/2004 Document Revised: 06/28/2016 Document Reviewed: 05/12/2016 Elsevier Interactive Patient Education  2019 [employer]  Thrombocytopenia  Thrombocytopenia means that you have a low number of platelets in your blood. Platelets are tiny cells in the blood. When you bleed, they clump together at the cut or injury to stop the bleeding. This is called blood clotting. Not having enough platelets can cause bleeding problems. Follow these instructions at home: General instructions  Check your skin and inside your mouth for bruises or blood as told by your doctor.  Check to see if there is blood in your spit (sputum), pee (urine), and poop (stool). Do  this as told by your doctor.  Ask your doctor if you can drink alcohol.  Take over-the-counter and prescription medicines only as told by your doctor.  Tell all of your doctors that you have this condition. Be sure to tell your dentist and eye doctor too. Activity  Do not do activities that can cause bumps or bruises until your doctor says it is okay.  Be careful not to cut yourself: ? When you shave. ? When you use scissors, needles, knives, or other tools.  Be careful not to burn yourself: ? When you use an iron. ? When you cook. Contact a doctor if:  You have bruises and you do not know why. Get help right away if:  You are bleeding anywhere on your body.  You have blood in your spit, pee, or poop. This information is not intended to replace advice given to you by your health care provider. Make sure you discuss any questions you have with your health care provider. Document Released: 10/19/2011 Document  Revised: 07/02/2016 Document Reviewed: 05/03/2015 Elsevier Interactive Patient Education  [employer].

## 2019-02-25 ENCOUNTER — Telehealth: Payer: Self-pay

## 2019-02-25 ENCOUNTER — Telehealth: Payer: Self-pay | Admitting: Oncology

## 2019-02-25 ENCOUNTER — Other Ambulatory Visit: Payer: Medicare Other

## 2019-02-25 ENCOUNTER — Inpatient Hospital Stay: Payer: Medicare Other | Attending: Oncology | Admitting: Oncology

## 2019-02-25 DIAGNOSIS — C7951 Secondary malignant neoplasm of bone: Secondary | ICD-10-CM | POA: Insufficient documentation

## 2019-02-25 DIAGNOSIS — L989 Disorder of the skin and subcutaneous tissue, unspecified: Secondary | ICD-10-CM | POA: Insufficient documentation

## 2019-02-25 DIAGNOSIS — C61 Malignant neoplasm of prostate: Secondary | ICD-10-CM

## 2019-02-25 DIAGNOSIS — Z79899 Other long term (current) drug therapy: Secondary | ICD-10-CM | POA: Insufficient documentation

## 2019-02-25 DIAGNOSIS — Z7982 Long term (current) use of aspirin: Secondary | ICD-10-CM | POA: Insufficient documentation

## 2019-02-25 DIAGNOSIS — Z85820 Personal history of malignant melanoma of skin: Secondary | ICD-10-CM | POA: Insufficient documentation

## 2019-02-25 DIAGNOSIS — Z192 Hormone resistant malignancy status: Secondary | ICD-10-CM | POA: Insufficient documentation

## 2019-02-25 DIAGNOSIS — C799 Secondary malignant neoplasm of unspecified site: Secondary | ICD-10-CM

## 2019-02-25 DIAGNOSIS — D696 Thrombocytopenia, unspecified: Secondary | ICD-10-CM | POA: Insufficient documentation

## 2019-02-25 HISTORY — DX: Malignant neoplasm of prostate: C61

## 2019-02-25 NOTE — Progress Notes (Signed)
Hematology and Oncology Follow Up for Telemedicine Visits  [name] [id number] 01/24/1936 83 y.o. 02/25/2019 4:10 PM [name], MDLalonde, [name], MD   I connected with Mr. Corey Oneill on 02/25/19 at  3:45 PM EDT by telephone visit and verified that I am speaking with the correct person using two identifiers.   I discussed the limitations, risks, security and privacy concerns of performing an evaluation and management service by telemedicine and the availability of in-person appointments. I also discussed with the patient that there may be a patient responsible charge related to this service. The patient expressed understanding and agreed to proceed.  Other persons participating in the visit and their role in the encounter: None  Patient's location: Home  provider's location: Office  Chief Complaint: Blisters on his fingers.  Principle Diagnosis: 83 year old man with:  1.  Prostate cancer that is currently-castration resistant he has isolated bony metastasis.  2.  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  3.  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   Current therapy:  Lupron every 4 months under the care of Dr. Junious Silk. Under consideration for additional therapy for his prostate cancer.  Interim History: Mr. Corey Oneill reports feeling well without any major complaints since the last evaluation.  He does report blisters on his fingers and he had one on his foot.  He denies any bleeding, bruising or petechiae.  He denies any constitutional symptoms or recent hospitalizations or illnesses.  His performance status and quality of life remain excellent.  He denies any recent medication changes or recent infections.     Medications: I have reviewed the patient's current medications.  Current Outpatient Medications  Medication Sig Dispense Refill  . amoxicillin (AMOXIL) 500 MG tablet Take 4 tablets (2,000 mg) one hour  prior to ALL dental visits. (Patient not taking: Reported on 02/24/2019) 8 tablet 6  . aspirin EC 81 MG tablet Take 81 mg by mouth daily.    . carvedilol (COREG) 3.125 MG tablet Take 1 tablet (3.125 mg total) by mouth 2 (two) times daily. 60 tablet 11  . ferrous sulfate 325 (65 FE) MG EC tablet Take 1 tablet (325 mg total) by mouth daily with breakfast. If no side effects, change to twice daily after 1 week. 30 tablet 2  . isosorbide mononitrate (IMDUR) 30 MG 24 hr tablet Take 1 tablet (30 mg total) by mouth daily. 30 tablet 11  . MULTIPLE VITAMIN PO Take 1 tablet by mouth daily.    . nitroGLYCERIN (NITROSTAT) 0.4 MG SL tablet 1 TAB UNDER TONGUE AS NEEDED FOR CHEST PAIN. MAY REPEAT EVERY 5 MIN FOR A TOTAL OF 3 DOSES. (Patient not taking: No sig reported) 25 tablet 4  . rosuvastatin (CRESTOR) 10 MG tablet Take 1 tablet (10 mg total) by mouth daily. 90 tablet 0  . vortioxetine HBr (TRINTELLIX) 5 MG TABS tablet Take 1 tablet (5 mg total) by mouth daily. 30 tablet 0   No current facility-administered medications for this visit.      Allergies: No Known Allergies   Lab Results: Lab Results  Component Value Date   WBC 3.1 (L) 01/14/2019   HGB 9.2 (L) 01/14/2019   HCT 28.9 (L) 01/14/2019   MCV 100.0 01/14/2019   PLT 60 (L) 01/14/2019     Chemistry      Component Value Date/Time   NA 134 (L) 12/24/2018 0428   NA 138 09/26/2018 1031  NA 141 09/21/2017 1101   K 4.3 12/24/2018 0428   K 4.4 09/21/2017 1101   CL 103 12/24/2018 0428   CO2 22 12/24/2018 0428   CO2 29 09/21/2017 1101   BUN 12 12/24/2018 0428   BUN 16 09/26/2018 1031   BUN 18.7 09/21/2017 1101   CREATININE 0.66 12/24/2018 0428   CREATININE 0.9 09/21/2017 1101      Component Value Date/Time   CALCIUM 8.0 (L) 12/24/2018 0428   CALCIUM 8.7 09/21/2017 1101   ALKPHOS 79 09/21/2017 1101   AST 22 09/21/2017 1101   ALT 16 09/21/2017 1101   BILITOT 0.70 09/21/2017 1101         Impression and Plan:  83 year old man  with:  1.    Prostate cancer with isolated metastatic lesion in the acetabulum documented in January 2020.  His disease is castration-resistant after initially diagnosed in 2014 and treated with radiation therapy utilizing IMRT  He is following with Dr. Junious Silk regarding this issue.  Treatment options have been reviewed with the patient as well as Dr. Junious Silk.  He is considering starting enzalutamide versus apalutamide.  Both of those options are reasonable at this time.  I would defer systemic chemotherapy unless he develops widespread disease in the future.  2. Thrombocytopenia:  His platelet count has been above baseline with platelet count of 60,000 in March 2020.  He had platelet counts as low as 40,000 in 2018.  I do not believe his cutaneous manifestation including blisters are related to his thrombocytopenia.   3.  Blisters on his finger: Unclear etiology to me.  I have recommended dermatology follow-up at this time.  In the meantime I asked him to alert me if he develops any rash or any constitutional symptoms.  4.  Follow-up: Will be in 3 months for repeat laboratory testing.    I discussed the assessment and treatment plan with the patient. The patient was provided an opportunity to ask questions and all were answered. The patient agreed with the plan and demonstrated an understanding of the instructions.   The patient was advised to call back or seek an in-person evaluation if the symptoms worsen or if the condition fails to improve as anticipated.  I provided 20  minutes of non face-to-face telephone visit time during this encounter, and > 50% was dedicated to reviewing laboratory data, disease status update and answering questions regarding future plan of care.  Zola Button, MD 02/25/2019 4:10 PM

## 2019-02-25 NOTE — Telephone Encounter (Signed)
Left message for patient to call and to schedule 1 month follow up.

## 2019-02-25 NOTE — Telephone Encounter (Signed)
No los per 4/14.

## 2019-02-28 DIAGNOSIS — E559 Vitamin D deficiency, unspecified: Secondary | ICD-10-CM | POA: Diagnosis not present

## 2019-02-28 DIAGNOSIS — C61 Malignant neoplasm of prostate: Secondary | ICD-10-CM | POA: Diagnosis not present

## 2019-03-04 DIAGNOSIS — L301 Dyshidrosis [pompholyx]: Secondary | ICD-10-CM | POA: Diagnosis not present

## 2019-03-05 ENCOUNTER — Telehealth: Payer: Self-pay | Admitting: Family Medicine

## 2019-03-05 DIAGNOSIS — R351 Nocturia: Secondary | ICD-10-CM | POA: Diagnosis not present

## 2019-03-05 DIAGNOSIS — D699 Hemorrhagic condition, unspecified: Secondary | ICD-10-CM | POA: Diagnosis not present

## 2019-03-05 DIAGNOSIS — C61 Malignant neoplasm of prostate: Secondary | ICD-10-CM | POA: Diagnosis not present

## 2019-03-05 DIAGNOSIS — C7951 Secondary malignant neoplasm of bone: Secondary | ICD-10-CM | POA: Diagnosis not present

## 2019-03-05 DIAGNOSIS — Z192 Hormone resistant malignancy status: Secondary | ICD-10-CM | POA: Diagnosis not present

## 2019-03-05 DIAGNOSIS — F341 Dysthymic disorder: Secondary | ICD-10-CM

## 2019-03-05 MED ORDER — VORTIOXETINE HBR 5 MG PO TABS: 5.0000 mg | ORAL_TABLET | Freq: Every day | ORAL | 1 refills | Status: DC

## 2019-03-05 NOTE — Telephone Encounter (Signed)
Pt was given samples of Trintellix and now needs a rx sent into [facility]. Pt can be reached at [phone].

## 2019-03-06 ENCOUNTER — Telehealth: Payer: Self-pay | Admitting: Cardiovascular Disease

## 2019-03-06 DIAGNOSIS — L138 Other specified bullous disorders: Secondary | ICD-10-CM | POA: Diagnosis not present

## 2019-03-06 DIAGNOSIS — D696 Thrombocytopenia, unspecified: Secondary | ICD-10-CM | POA: Diagnosis not present

## 2019-03-06 DIAGNOSIS — L0291 Cutaneous abscess, unspecified: Secondary | ICD-10-CM | POA: Diagnosis not present

## 2019-03-06 NOTE — Telephone Encounter (Signed)
Pt c/o swelling: STAT is pt has developed SOB within 24 hours  1) How much weight have you gained and in what time span? no  2) If swelling, where is the swelling located? Right ankle  3) Are you currently taking a fluid pill? no  4) Are you currently SOB? no  5) Do you have a log of your daily weights (if so, list)? no  6) Have you gained 3 pounds in a day or 5 pounds in a week? no  7) Have you traveled recently? no

## 2019-03-06 NOTE — Telephone Encounter (Signed)
Mr. Buchmann went to the dermatologist today for blood blisters on his R hand and R foot. While being evaluated, the dermatologist noticed some slight swelling in the R foot and instructed him to contact Cardiology.  The patient states the swelling is very minimal - "hardly noticeable." He states his shoe still fits well. It is not hot or red. He does not remember injuring that foot and he has no bug bites. He states he did eat [place] fries with added salt today for lunch.  He denies SOB and other cardiac symptoms. Instructed the patient to elevate the foot while sitting and to continue to monitor swelling. He understands to call if the swelling worsens or the foot becomes discolored or has temperature changes.  He was grateful for call and agrees with treatment plan.

## 2019-03-07 ENCOUNTER — Telehealth: Payer: Self-pay | Admitting: Oncology

## 2019-03-07 ENCOUNTER — Other Ambulatory Visit: Payer: Self-pay | Admitting: Oncology

## 2019-03-07 ENCOUNTER — Telehealth: Payer: Self-pay | Admitting: *Deleted

## 2019-03-07 DIAGNOSIS — D696 Thrombocytopenia, unspecified: Secondary | ICD-10-CM | POA: Diagnosis not present

## 2019-03-07 DIAGNOSIS — Z862 Personal history of diseases of the blood and blood-forming organs and certain disorders involving the immune mechanism: Secondary | ICD-10-CM

## 2019-03-07 NOTE — Telephone Encounter (Signed)
Provider call reporting platelet count, blood filled blisters transferred to Dr. Alen Blew at 10:34 am.

## 2019-03-07 NOTE — Telephone Encounter (Signed)
Scheduled apt per 4/24 sch message - unable to reach patient. Left message with appt date and time

## 2019-03-08 DIAGNOSIS — D696 Thrombocytopenia, unspecified: Secondary | ICD-10-CM | POA: Diagnosis not present

## 2019-03-10 DIAGNOSIS — D696 Thrombocytopenia, unspecified: Secondary | ICD-10-CM | POA: Diagnosis not present

## 2019-03-10 DIAGNOSIS — L0390 Cellulitis, unspecified: Secondary | ICD-10-CM | POA: Diagnosis not present

## 2019-03-11 ENCOUNTER — Inpatient Hospital Stay: Payer: Medicare Other

## 2019-03-11 ENCOUNTER — Telehealth: Payer: Self-pay | Admitting: Oncology

## 2019-03-11 ENCOUNTER — Other Ambulatory Visit: Payer: Self-pay

## 2019-03-11 ENCOUNTER — Inpatient Hospital Stay (HOSPITAL_BASED_OUTPATIENT_CLINIC_OR_DEPARTMENT_OTHER): Payer: Medicare Other | Admitting: Oncology

## 2019-03-11 VITALS — BP 133/60 | HR 98 | Temp 97.8°F | Resp 18 | Ht 70.0 in | Wt 158.0 lb

## 2019-03-11 DIAGNOSIS — C61 Malignant neoplasm of prostate: Secondary | ICD-10-CM | POA: Diagnosis not present

## 2019-03-11 DIAGNOSIS — Z192 Hormone resistant malignancy status: Secondary | ICD-10-CM | POA: Diagnosis not present

## 2019-03-11 DIAGNOSIS — C7951 Secondary malignant neoplasm of bone: Secondary | ICD-10-CM | POA: Diagnosis not present

## 2019-03-11 DIAGNOSIS — D696 Thrombocytopenia, unspecified: Secondary | ICD-10-CM

## 2019-03-11 DIAGNOSIS — Z85820 Personal history of malignant melanoma of skin: Secondary | ICD-10-CM

## 2019-03-11 DIAGNOSIS — Z7982 Long term (current) use of aspirin: Secondary | ICD-10-CM | POA: Diagnosis not present

## 2019-03-11 DIAGNOSIS — Z79899 Other long term (current) drug therapy: Secondary | ICD-10-CM | POA: Diagnosis not present

## 2019-03-11 DIAGNOSIS — L989 Disorder of the skin and subcutaneous tissue, unspecified: Secondary | ICD-10-CM

## 2019-03-11 DIAGNOSIS — Z862 Personal history of diseases of the blood and blood-forming organs and certain disorders involving the immune mechanism: Secondary | ICD-10-CM

## 2019-03-11 LAB — CBC WITH DIFFERENTIAL (CANCER CENTER ONLY)
Abs Immature Granulocytes: 0.02 10*3/uL (ref 0.00–0.07)
Basophils Absolute: 0 10*3/uL (ref 0.0–0.1)
Basophils Relative: 1 %
Eosinophils Absolute: 0 10*3/uL (ref 0.0–0.5)
Eosinophils Relative: 1 %
HCT: 35.5 % — ABNORMAL LOW (ref 39.0–52.0)
Hemoglobin: 11.9 g/dL — ABNORMAL LOW (ref 13.0–17.0)
Immature Granulocytes: 1 %
Lymphocytes Relative: 28 %
Lymphs Abs: 1.1 10*3/uL (ref 0.7–4.0)
MCH: 31.8 pg (ref 26.0–34.0)
MCHC: 33.5 g/dL (ref 30.0–36.0)
MCV: 94.9 fL (ref 80.0–100.0)
Monocytes Absolute: 0.5 10*3/uL (ref 0.1–1.0)
Monocytes Relative: 12 %
Neutro Abs: 2.4 10*3/uL (ref 1.7–7.7)
Neutrophils Relative %: 57 %
Platelet Count: 38 10*3/uL — ABNORMAL LOW (ref 150–400)
RBC: 3.74 MIL/uL — ABNORMAL LOW (ref 4.22–5.81)
RDW: 12.8 % (ref 11.5–15.5)
WBC Count: 4.1 10*3/uL (ref 4.0–10.5)
nRBC: 0 % (ref 0.0–0.2)

## 2019-03-11 NOTE — Telephone Encounter (Signed)
Scheduled appt per 4/28 sch message.  MD visit will be added, sent a staff message for override to get it scheduled.

## 2019-03-11 NOTE — Progress Notes (Signed)
Hematology and Oncology Follow Up Visit  Corey Oneill [id number] January 29, 1936 83 y.o. 03/11/2019 1:11 PM Corey Oneill, MDLalonde, Corey Jarvis, MD   Principle Diagnosis: 83 year old man with:  1.  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 The final pathology revealed a 0.39 mm Clark's level was IV superficial spreading melanoma. No ulceration was noted with very low mitotic index.   2. 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  3.  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  Is currently following with Dr. Junious Silk.  Prior Therapy:   He is status post wide excision of his melanoma in December 2017.  Prednisone therapy currently at 60 mg daily started in February 2018 and was tapered to off in May 2018.  Current therapy:   Active surveillance for his thrombocytopenia.  Lupron every 6 months under the care of Dr. Junious Silk.  Interim History:  Mr. Corey Oneill is here for a repeat evaluation.  Since the last visit, he has been noticing blood blisters on his right big toe started in the last few weeks.  He also noted few of those on his right fingers.  1 of them healed spontaneously although some has persisted.  He was evaluated by dermatology as well as primary care provider and was prescribed local creams as well as antibiotic coverage.  He denies any active bleeding otherwise.  He denies any constitutional symptoms.  He was a started on Xtandi by Dr. Junious Silk but has not started the medication until today.  Patient denied any alteration mental status, neuropathy, confusion or dizziness.  Denies any headaches or lethargy.  Denies any night sweats, weight loss or changes in appetite.  Denied orthopnea, dyspnea on exertion or chest discomfort.  Denies shortness of breath, difficulty breathing hemoptysis or cough.  Denies any abdominal distention, nausea, early satiety or dyspepsia.  Denies any hematuria, frequency, dysuria or  nocturia.  Denies any skin irritation, dryness or rash.  Denies any lymphadenopathy or clotting.  Denies any heat or cold intolerance.  Denies any anxiety or depression.  Remaining review of system is negative.     Medications: I have reviewed the patient's current medications.  Current Outpatient Medications  Medication Sig Dispense Refill  . amoxicillin (AMOXIL) 500 MG tablet Take 4 tablets (2,000 mg) one hour prior to ALL dental visits. (Patient not taking: Reported on 02/24/2019) 8 tablet 6  . aspirin EC 81 MG tablet Take 81 mg by mouth daily.    . carvedilol (COREG) 3.125 MG tablet Take 1 tablet (3.125 mg total) by mouth 2 (two) times daily. 60 tablet 11  . ferrous sulfate 325 (65 FE) MG EC tablet Take 1 tablet (325 mg total) by mouth daily with breakfast. If no side effects, change to twice daily after 1 week. 30 tablet 2  . isosorbide mononitrate (IMDUR) 30 MG 24 hr tablet Take 1 tablet (30 mg total) by mouth daily. 30 tablet 11  . MULTIPLE VITAMIN PO Take 1 tablet by mouth daily.    . nitroGLYCERIN (NITROSTAT) 0.4 MG SL tablet 1 TAB UNDER TONGUE AS NEEDED FOR CHEST PAIN. MAY REPEAT EVERY 5 MIN FOR A TOTAL OF 3 DOSES. (Patient not taking: No sig reported) 25 tablet 4  . rosuvastatin (CRESTOR) 10 MG tablet Take 1 tablet (10 mg total) by mouth daily. 90 tablet 0  . vortioxetine HBr (TRINTELLIX) 5 MG TABS tablet Take 1 tablet (5 mg total) by mouth daily. 30 tablet 1   No current  facility-administered medications for this visit.      Allergies: No Known Allergies  Past Medical History, Surgical history, Social history, and Family History were reviewed and updated.  Physical Exam: Blood pressure 133/60, pulse 98, temperature 97.8 F (36.6 C), temperature source Oral, resp. rate 18, height 5\' 10"  (1.778 m), weight 158 lb (71.7 kg), SpO2 97 %.    ECOG: 0   General appearance: Comfortable appearing without any discomfort Head: Normocephalic without any trauma Oropharynx: Mucous  membranes are moist and pink without any thrush or ulcers. Eyes: Pupils are equal and round reactive to light. Lymph nodes: No cervical, supraclavicular, inguinal or axillary lymphadenopathy.   Heart:regular rate and rhythm.  S1 and S2 without leg edema. Oneill: Clear without any rhonchi or wheezes.  No dullness to percussion. Abdomin: Soft, nontender, nondistended with good bowel sounds.  No hepatosplenomegaly. Musculoskeletal: No joint deformity or effusion.  Full range of motion noted. Neurological: No deficits noted on motor, sensory and deep tendon reflex exam. Skin: Deep purple discoloration noted on his right hand including the dorsal aspect of some of his fingers.  He also had similar spots noted on his left big toe.    Lab Results: Lab Results  Component Value Date   WBC 3.1 (L) 01/14/2019   HGB 9.2 (L) 01/14/2019   HCT 28.9 (L) 01/14/2019   MCV 100.0 01/14/2019   PLT 60 (L) 01/14/2019     Chemistry      Component Value Date/Time   NA 134 (L) 12/24/2018 0428   NA 138 09/26/2018 1031   NA 141 09/21/2017 1101   K 4.3 12/24/2018 0428   K 4.4 09/21/2017 1101   CL 103 12/24/2018 0428   CO2 22 12/24/2018 0428   CO2 29 09/21/2017 1101   BUN 12 12/24/2018 0428   BUN 16 09/26/2018 1031   BUN 18.7 09/21/2017 1101   CREATININE 0.66 12/24/2018 0428   CREATININE 0.9 09/21/2017 1101      Component Value Date/Time   CALCIUM 8.0 (L) 12/24/2018 0428   CALCIUM 8.7 09/21/2017 1101   ALKPHOS 79 09/21/2017 1101   AST 22 09/21/2017 1101   ALT 16 09/21/2017 1101   BILITOT 0.70 09/21/2017 1101       Impression and Plan:  83 year old man with:       1. Thrombocytopenia due to presumed ITP diagnosed in February 2018.  This platelet count today is 38,000 and appears to be improving compared to laboratory testing obtained last week with a platelet count of close to 30,000.  The natural course of this disease was discussed again today.  Treatment options were reviewed which  include continued surveillance versus prednisone versus rituximab among others.  His platelet count appears to be improving at this time without any active bleeding and I recommended close surveillance at this time.  He declined prednisone which he has received in the past with reasonable response.   2. Prostate cancer: He has castration resistant disease with limited involvement of the bone.  He is currently on Xtandi which she has started under the care of Dr. Junious Silk.  3.  Skin lesions: Unclear etiology could be related to blood blisters versus emboli.  He is currently treating it with topical creams as well as oral antibiotics.  He has follow-up with dermatology regarding this issue.  It is possible the skin lesions could be related to his thrombocytopenia although his platelet count has not been dramatically different than it has been in the past without any similar  issues.  I recommend continued with the current therapy and will continue to monitor his platelets in the interim.   4.  Cutaneous melanoma of the chest wall diagnosed in December 2017.  He has no evidence of relapsed disease at this time and continues to be on active surveillance.    Risk of relapse was assessed today and continues to be low at this time.  No additional imaging studies is needed.  5. Follow-up: We will be in the next 8 weeks to follow his progress.   25  minutes was spent with the patient face-to-face today.  More than 50% of time was spent on reviewing his disease status, laboratory data, differential diagnosis and management options.   Zola Button, MD 4/28/20201:11 PM

## 2019-03-14 DIAGNOSIS — D696 Thrombocytopenia, unspecified: Secondary | ICD-10-CM | POA: Diagnosis not present

## 2019-03-17 ENCOUNTER — Ambulatory Visit: Payer: Medicare Other | Admitting: Podiatry

## 2019-03-17 DIAGNOSIS — C7951 Secondary malignant neoplasm of bone: Secondary | ICD-10-CM | POA: Diagnosis not present

## 2019-03-17 DIAGNOSIS — Z5111 Encounter for antineoplastic chemotherapy: Secondary | ICD-10-CM | POA: Diagnosis not present

## 2019-03-17 DIAGNOSIS — C61 Malignant neoplasm of prostate: Secondary | ICD-10-CM | POA: Diagnosis not present

## 2019-03-19 DIAGNOSIS — L0291 Cutaneous abscess, unspecified: Secondary | ICD-10-CM | POA: Diagnosis not present

## 2019-03-19 DIAGNOSIS — L139 Bullous disorder, unspecified: Secondary | ICD-10-CM | POA: Diagnosis not present

## 2019-03-24 DIAGNOSIS — C61 Malignant neoplasm of prostate: Secondary | ICD-10-CM | POA: Diagnosis not present

## 2019-03-24 DIAGNOSIS — C7951 Secondary malignant neoplasm of bone: Secondary | ICD-10-CM | POA: Diagnosis not present

## 2019-04-02 ENCOUNTER — Encounter: Payer: Self-pay | Admitting: Family Medicine

## 2019-04-02 ENCOUNTER — Ambulatory Visit (INDEPENDENT_AMBULATORY_CARE_PROVIDER_SITE_OTHER): Payer: Medicare Other | Admitting: Family Medicine

## 2019-04-02 ENCOUNTER — Other Ambulatory Visit: Payer: Self-pay

## 2019-04-02 VITALS — BP 128/76 | HR 75 | Temp 96.9°F | Wt 161.8 lb

## 2019-04-02 DIAGNOSIS — I25119 Atherosclerotic heart disease of native coronary artery with unspecified angina pectoris: Secondary | ICD-10-CM

## 2019-04-02 DIAGNOSIS — T148XXA Other injury of unspecified body region, initial encounter: Secondary | ICD-10-CM | POA: Diagnosis not present

## 2019-04-02 NOTE — Progress Notes (Signed)
   Subjective:    Patient ID: Corey Oneill, male    DOB: [birth year], 83 y.o.   MRN: [id number]  HPI He is here for evaluation of some blistering present on both hands.  No history of recent injury or overuse.  He notes no lesions anywhere else on his body.  Did have a previous episode of this in late April.  Blood work at that time through the oncology office showed no major issues. He was supposed to follow-up with dermatology however I do not have a note in my record.  Review of Systems     Objective:   Physical Exam Alert and in no distress.  Several blood-filled blister lesions are noted on the palms of the hands.  He does have some purpura present on his hands on the dorsal surface which is old.  No lesions noted on his abdomen or lower extremities.       Assessment & Plan:  Blood blister I will attempt to get the information from his dermatologist or possibly refer over to be seen.

## 2019-04-03 ENCOUNTER — Other Ambulatory Visit: Payer: Self-pay

## 2019-04-03 DIAGNOSIS — T148XXA Other injury of unspecified body region, initial encounter: Secondary | ICD-10-CM

## 2019-04-09 ENCOUNTER — Encounter: Payer: Self-pay | Admitting: Family Medicine

## 2019-04-09 ENCOUNTER — Other Ambulatory Visit: Payer: Self-pay

## 2019-04-09 ENCOUNTER — Ambulatory Visit (INDEPENDENT_AMBULATORY_CARE_PROVIDER_SITE_OTHER): Payer: Medicare Other | Admitting: Family Medicine

## 2019-04-09 VITALS — BP 120/76 | HR 58 | Temp 97.8°F | Wt 159.8 lb

## 2019-04-09 DIAGNOSIS — C61 Malignant neoplasm of prostate: Secondary | ICD-10-CM

## 2019-04-09 DIAGNOSIS — Z9889 Other specified postprocedural states: Secondary | ICD-10-CM

## 2019-04-09 DIAGNOSIS — M79671 Pain in right foot: Secondary | ICD-10-CM

## 2019-04-09 DIAGNOSIS — T148XXA Other injury of unspecified body region, initial encounter: Secondary | ICD-10-CM

## 2019-04-09 DIAGNOSIS — L989 Disorder of the skin and subcutaneous tissue, unspecified: Secondary | ICD-10-CM | POA: Diagnosis not present

## 2019-04-09 DIAGNOSIS — C799 Secondary malignant neoplasm of unspecified site: Secondary | ICD-10-CM | POA: Diagnosis not present

## 2019-04-09 DIAGNOSIS — D696 Thrombocytopenia, unspecified: Secondary | ICD-10-CM

## 2019-04-09 DIAGNOSIS — F341 Dysthymic disorder: Secondary | ICD-10-CM

## 2019-04-09 DIAGNOSIS — L120 Bullous pemphigoid: Secondary | ICD-10-CM | POA: Diagnosis not present

## 2019-04-09 HISTORY — DX: Other specified postprocedural states: Z98.890

## 2019-04-09 NOTE — Progress Notes (Signed)
Corey Oneill is a 83 y.o. male who presents for annual wellness visit and follow-up on chronic medical conditions.  He continues to be followed by Dr. Junious Silk for his metastatic prostate cancer and is now on Xtandi.  He is also followed by Dr. Burt Knack as well as Dr Alen Blew.  He also has a previous history of melanoma.  He has had recent difficulty with blistering that is thought to be ITP related.  His last platelet count was around 30,000.  He was recently seen by Dr. Nevada Crane for another evaluation and a biopsy was done.  He mentioned the possibility of SBE but at this point there is no other evidence that that is going on. Psychologically he seems to be doing well.  He is comfortable with his present medication regimen.  His immunizations were reviewed.  He has had no falls.  He does complain of a 1 day history of right foot discomfort as well as some slight edema.  He is concerned about possible metastatic spread.  Immunizations and Health Maintenance Immunization History  Administered Date(s) Administered  . DTaP 01/13/2009  . Influenza Split 09/01/2013  . Influenza Whole 08/16/2008  . Influenza, High Dose Seasonal PF 08/19/2015, 07/05/2017, 08/15/2018  . Influenza-Unspecified 08/13/2014, 08/24/2017  . Pneumococcal Conjugate-13 01/13/2009  . Pneumococcal Polysaccharide-23 11/20/2014  . Zoster 05/09/2007  . Zoster Recombinat (Shingrix) 02/15/2017, 04/18/2017   Health Maintenance Due  Topic Date Due  . TETANUS/TDAP  12/29/1956    Last colonoscopy:06/26/17 Last PSA: 07/05/2017 followed by Dr. Junious Silk Dentist: 02/2019 Ophtho: unkown Exercise: walking   Other doctors caring for patient include: Dr. Rachael Darby. , Dr. Alen Blew oncology, Dr. Junious Silk urology, Dr. Burt Knack cardio.  Advanced Directives: Yes.  Copy asked for. Does Patient Have a Medical Advance Directive?: Yes Type of Advance Directive: Healthcare Power of Attorney, Living will Does patient want to make changes to medical advance  directive?: No - Patient declined Copy of [place] in Chart?: No - copy requested  Depression screen:  See questionnaire below.     Depression screen [facility] 2/9 04/09/2019 10/14/2018 09/12/2018 07/05/2017 03/03/2016  Decreased Interest 0 1 0 0 0  Down, Depressed, Hopeless 3 3 1  0 1  PHQ - 2 Score 3 4 1  0 1  Altered sleeping 0 0 - - -  Tired, decreased energy 0 0 - - -  Change in appetite 0 0 - - -  Feeling bad or failure about yourself  0 1 - - -  Trouble concentrating 0 0 - - -  Moving slowly or fidgety/restless 0 0 - - -  Suicidal thoughts 0 0 - - -  PHQ-9 Score 3 5 - - -  Difficult doing work/chores Somewhat difficult Not difficult at all - - -  Some recent data might be hidden    Fall Screen: See Questionaire below.   Fall Risk  04/09/2019 10/01/2018 09/12/2018 07/05/2017 06/04/2017  Falls in the past year? 0 1 Yes No No  Comment - Emmi Telephone Survey: data to providers prior to load - - Emmi Telephone Survey: data to providers prior to load  Number falls in past yr: - 1 1 - -  Comment - Emmi Telephone Survey Actual Response = 1 - - -  Injury with Fall? - 1 Yes - -  Comment - - skin cut  - -  Risk for fall due to : - - - - -  Follow up - - - - -    ADL  screen:  See questionnaire below.  Functional Status Survey: Is the patient deaf or have difficulty hearing?: Yes Does the patient have difficulty seeing, even when wearing glasses/contacts?: No Does the patient have difficulty concentrating, remembering, or making decisions?: No Does the patient have difficulty walking or climbing stairs?: No Does the patient have difficulty dressing or bathing?: No Does the patient have difficulty doing errands alone such as visiting a doctor's office or shopping?: No   Review of Systems  Constitutional: -, -unexpected weight change, -anorexia, -fatigue PHYSICAL EXAM:  BP 120/76 (BP Location: Left Arm, Patient Position: Sitting)   Pulse (!) 58   Temp 97.8 F (36.6  C)   Wt 159 lb 12.8 oz (72.5 kg)   SpO2 97%   BMI 22.93 kg/m   General Appearance: Alert, cooperative, no distress, appears stated age Head: Normocephalic, without obvious abnormality, atraumatic Heart: Regular rate and rhythm, S1 and S2 normal, no murmur, rub or gallop Abdomen: Soft, non-tender, nondistended, normoactive bowel sounds, no masses, no hepatosplenomegaly Extremities: No clubbing, cyanosis right foot exam does show some effusion in the talar area with good motion and no pain.  No laxity noted. Pulses: 2+ and symmetric all extremities  Psych: Normal mood, affect, hygiene and grooming  ASSESSMENT/PLAN: Metastasis from malignant neoplasm of prostate (HCC)  Dysthymia  Thrombocytopenia (HCC)  Blood blister  Acute pain of right foot He will follow-up with Dr. Junious Silk concerning his prostate cancer. Recommend watchful waiting concerning the right foot.  If he continues have trouble we can do further evaluation of that. We will wait on the results of the biopsy.  I have a feeling this is probably more related to late lead function. Did recommend he get a Tdap.      Medicare Attestation I have personally reviewed: The patient's medical and social history Their use of alcohol, tobacco or illicit drugs Their current medications and supplements The patient's functional ability including ADLs,fall risks, home safety risks, cognitive, and hearing and visual impairment Diet and physical activities Evidence for depression or mood disorders  The patient's weight, height, and BMI have been recorded in the chart.  I have made referrals, counseling, and provided education to the patient based on review of the above and I have provided the patient with a written personalized care plan for preventive services.     Jill Alexanders, MD   04/09/2019

## 2019-04-09 NOTE — Patient Instructions (Signed)
  Mr. Corey Oneill , Thank you for taking time to come for your Medicare Wellness Visit. I appreciate your ongoing commitment to your health goals. Please review the following plan we discussed and let me know if I can assist you in the future.   These are the goals we discussed: Recommend watchful waiting concerning the right foot.  If he continues have trouble we can do further evaluation of that. We will wait on the results of the biopsy.  I have a feeling this is probably more related to late lead function. Did recommend he get a Tdap.   This is a list of the screening recommended for you and due dates:  Health Maintenance  Topic Date Due  . Tetanus Vaccine  12/29/1956  . Flu Shot  06/14/2019  . Pneumonia vaccines  Completed

## 2019-04-14 ENCOUNTER — Telehealth: Payer: Self-pay | Admitting: Cardiovascular Disease

## 2019-04-14 NOTE — Telephone Encounter (Signed)
Patient wanted Dr. Burt Knack to know that he has blisters on both of his hands. Patient stated his dermatologist took biopsy of a couple of blisters today and the results will be back in a few days. Informed patient that we would give Dr. Burt Knack the update.

## 2019-04-14 NOTE — Telephone Encounter (Signed)
Left message for patient to call back  

## 2019-04-14 NOTE — Telephone Encounter (Signed)
Thanks for letting me know!

## 2019-04-14 NOTE — Telephone Encounter (Signed)
New Message     Pt is calling and says he is having issues with blood blisters on both of hi hands.  They're out for Biopsy    Please call

## 2019-04-15 DIAGNOSIS — L120 Bullous pemphigoid: Secondary | ICD-10-CM | POA: Diagnosis not present

## 2019-04-17 ENCOUNTER — Encounter: Payer: Self-pay | Admitting: Family Medicine

## 2019-04-28 DIAGNOSIS — F320 Major depressive disorder, single episode, mild: Secondary | ICD-10-CM | POA: Diagnosis not present

## 2019-05-02 DIAGNOSIS — L120 Bullous pemphigoid: Secondary | ICD-10-CM | POA: Diagnosis not present

## 2019-05-06 ENCOUNTER — Inpatient Hospital Stay: Payer: Medicare Other | Admitting: Oncology

## 2019-05-06 ENCOUNTER — Inpatient Hospital Stay: Payer: Medicare Other

## 2019-05-06 DIAGNOSIS — M79674 Pain in right toe(s): Secondary | ICD-10-CM | POA: Diagnosis not present

## 2019-05-07 ENCOUNTER — Telehealth: Payer: Self-pay | Admitting: Oncology

## 2019-05-07 NOTE — Telephone Encounter (Signed)
Called pt per  6/24 sch message - pt asked for me to call back in a few mins,. Because he didn't have his calender.    Called back and no answer - left message for patient to call back for reschedule.

## 2019-05-08 DIAGNOSIS — M79671 Pain in right foot: Secondary | ICD-10-CM | POA: Diagnosis not present

## 2019-05-09 DIAGNOSIS — L120 Bullous pemphigoid: Secondary | ICD-10-CM | POA: Diagnosis not present

## 2019-05-13 ENCOUNTER — Inpatient Hospital Stay: Payer: Medicare Other

## 2019-05-13 ENCOUNTER — Inpatient Hospital Stay: Payer: Medicare Other | Attending: Oncology | Admitting: Oncology

## 2019-05-13 ENCOUNTER — Telehealth: Payer: Self-pay

## 2019-05-13 ENCOUNTER — Telehealth: Payer: Self-pay | Admitting: Oncology

## 2019-05-13 ENCOUNTER — Other Ambulatory Visit: Payer: Self-pay

## 2019-05-13 VITALS — BP 124/60 | HR 80 | Temp 98.2°F | Resp 18 | Ht 70.0 in | Wt 160.3 lb

## 2019-05-13 DIAGNOSIS — L120 Bullous pemphigoid: Secondary | ICD-10-CM | POA: Diagnosis not present

## 2019-05-13 DIAGNOSIS — D693 Immune thrombocytopenic purpura: Secondary | ICD-10-CM | POA: Insufficient documentation

## 2019-05-13 DIAGNOSIS — C61 Malignant neoplasm of prostate: Secondary | ICD-10-CM

## 2019-05-13 DIAGNOSIS — C7951 Secondary malignant neoplasm of bone: Secondary | ICD-10-CM | POA: Insufficient documentation

## 2019-05-13 DIAGNOSIS — Z85820 Personal history of malignant melanoma of skin: Secondary | ICD-10-CM | POA: Diagnosis not present

## 2019-05-13 DIAGNOSIS — Z7951 Long term (current) use of inhaled steroids: Secondary | ICD-10-CM | POA: Diagnosis not present

## 2019-05-13 DIAGNOSIS — Z79899 Other long term (current) drug therapy: Secondary | ICD-10-CM | POA: Diagnosis not present

## 2019-05-13 DIAGNOSIS — Z192 Hormone resistant malignancy status: Secondary | ICD-10-CM | POA: Insufficient documentation

## 2019-05-13 DIAGNOSIS — M79671 Pain in right foot: Secondary | ICD-10-CM | POA: Diagnosis not present

## 2019-05-13 DIAGNOSIS — Z7982 Long term (current) use of aspirin: Secondary | ICD-10-CM | POA: Insufficient documentation

## 2019-05-13 LAB — CBC WITH DIFFERENTIAL (CANCER CENTER ONLY)
Abs Immature Granulocytes: 0.01 10*3/uL (ref 0.00–0.07)
Basophils Absolute: 0 10*3/uL (ref 0.0–0.1)
Basophils Relative: 1 %
Eosinophils Absolute: 0.1 10*3/uL (ref 0.0–0.5)
Eosinophils Relative: 2 %
HCT: 36.6 % — ABNORMAL LOW (ref 39.0–52.0)
Hemoglobin: 12.1 g/dL — ABNORMAL LOW (ref 13.0–17.0)
Immature Granulocytes: 0 %
Lymphocytes Relative: 25 %
Lymphs Abs: 1.2 10*3/uL (ref 0.7–4.0)
MCH: 32.8 pg (ref 26.0–34.0)
MCHC: 33.1 g/dL (ref 30.0–36.0)
MCV: 99.2 fL (ref 80.0–100.0)
Monocytes Absolute: 0.5 10*3/uL (ref 0.1–1.0)
Monocytes Relative: 10 %
Neutro Abs: 2.9 10*3/uL (ref 1.7–7.7)
Neutrophils Relative %: 62 %
Platelet Count: 72 10*3/uL — ABNORMAL LOW (ref 150–400)
RBC: 3.69 MIL/uL — ABNORMAL LOW (ref 4.22–5.81)
RDW: 13.2 % (ref 11.5–15.5)
WBC Count: 4.7 10*3/uL (ref 4.0–10.5)
nRBC: 0 % (ref 0.0–0.2)

## 2019-05-13 LAB — CMP (CANCER CENTER ONLY)
ALT: 9 U/L (ref 0–44)
AST: 14 U/L — ABNORMAL LOW (ref 15–41)
Albumin: 3.7 g/dL (ref 3.5–5.0)
Alkaline Phosphatase: 70 U/L (ref 38–126)
Anion gap: 8 (ref 5–15)
BUN: 18 mg/dL (ref 8–23)
CO2: 27 mmol/L (ref 22–32)
Calcium: 8.1 mg/dL — ABNORMAL LOW (ref 8.9–10.3)
Chloride: 103 mmol/L (ref 98–111)
Creatinine: 0.95 mg/dL (ref 0.61–1.24)
GFR, Est AFR Am: >60 mL/min (ref >60)
GFR, Estimated: >60 mL/min (ref >60)
Glucose, Bld: 114 mg/dL — ABNORMAL HIGH (ref 70–99)
Potassium: 4 mmol/L (ref 3.5–5.1)
Sodium: 138 mmol/L (ref 135–145)
Total Bilirubin: 0.5 mg/dL (ref 0.3–1.2)
Total Protein: 6.7 g/dL (ref 6.5–8.1)

## 2019-05-13 NOTE — Telephone Encounter (Signed)
Gave avs and calendar ° °

## 2019-05-13 NOTE — Progress Notes (Signed)
Hematology and Oncology Follow Up Visit  Corey Oneill [id number] 12-08-1935 83 y.o. 05/13/2019 1:09 PM [name], MDLalonde, [name], MD   Principle Diagnosis: 83 year old man with:  1.  Melanoma of the chest wall diagnosed in December 2017. The final pathology revealed a 0.39 mm Clark's level was IV superficial spreading melanoma. No ulceration was noted with very low mitotic index.   2.  ITP diagnosed in 2018.   3.  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  Is currently following with Dr. Junious Silk.  4.  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  Prior Therapy:   He is status post wide excision of his melanoma in December 2017.  Prednisone therapy currently at 60 mg daily started in February 2018 and was tapered to off in May 2018.  Current therapy:   Lupron every 6 months under the care of Dr. Junious Silk.  Xtandi 160 mg daily started by Dr. Junious Silk April 2020.  Interim History:  [name] is here for a repeat evaluation.  Since last visit, he reports no major changes in his health.  He was evaluated by dermatology because of recurrent blisters on his toes and fingers and biopsy showed suggestion of bullous pemphigoid.  He was referred to another dermatology clinic for additional opinion regarding this issue.  He is also has been taken Xtandi without any major complications.  He denies any sense of fatigue, nausea or hypotension.  His performance status quality of life remains intact.  No recent hospitalizations or illnesses.   He denied headaches, blurry vision, syncope or seizures.  Denies any fevers, chills or sweats.  Denied chest pain, palpitation, orthopnea or leg edema.  Denied cough, wheezing or hemoptysis.  Denied nausea, vomiting or abdominal pain.  Denies any constipation or diarrhea.  Denies any frequency urgency or hesitancy.  Denies any arthralgias or myalgias.  Denies any skin rashes or lesions.  Denies any bleeding or clotting  tendency.  Denies any easy bruising.  Denies any hair or nail changes.  Denies any anxiety or depression.  Remaining review of system is negative.        Medications: I have reviewed the patient's current medications.  Current Outpatient Medications  Medication Sig Dispense Refill  . amoxicillin (AMOXIL) 500 MG tablet Take 4 tablets (2,000 mg) one hour prior to ALL dental visits. (Patient not taking: Reported on 02/24/2019) 8 tablet 6  . aspirin EC 81 MG tablet Take 81 mg by mouth daily.    . carvedilol (COREG) 3.125 MG tablet Take 1 tablet (3.125 mg total) by mouth 2 (two) times daily. 60 tablet 11  . Enzalutamide (XTANDI PO) Take by mouth.    . ferrous sulfate 325 (65 FE) MG EC tablet Take 1 tablet (325 mg total) by mouth daily with breakfast. If no side effects, change to twice daily after 1 week. 30 tablet 2  . isosorbide mononitrate (IMDUR) 30 MG 24 hr tablet Take 1 tablet (30 mg total) by mouth daily. 30 tablet 11  . MULTIPLE VITAMIN PO Take 1 tablet by mouth daily.    . nitroGLYCERIN (NITROSTAT) 0.4 MG SL tablet 1 TAB UNDER TONGUE AS NEEDED FOR CHEST PAIN. MAY REPEAT EVERY 5 MIN FOR A TOTAL OF 3 DOSES. 25 tablet 4  . rosuvastatin (CRESTOR) 10 MG tablet Take 1 tablet (10 mg total) by mouth daily. 90 tablet 0  . vortioxetine HBr (TRINTELLIX) 5 MG TABS tablet Take 1 tablet (5 mg total) by mouth daily. (Patient not  taking: Reported on 04/09/2019) 30 tablet 1  . Zolpidem Tartrate (AMBIEN PO) Take by mouth.     No current facility-administered medications for this visit.      Allergies: No Known Allergies  Past Medical History, Surgical history, Social history, and Family History were reviewed and updated.  Physical Exam:  Blood pressure 124/60, pulse 80, temperature 98.2 F (36.8 C), temperature source Oral, resp. rate 18, height 5\' 10"  (1.778 m), weight 160 lb 4.8 oz (72.7 kg), SpO2 98 %.    ECOG: 0    General appearance: Alert, awake without any distress. Head: Atraumatic  without abnormalities Oropharynx: Without any thrush or ulcers. Eyes: No scleral icterus. Lymph nodes: No lymphadenopathy noted in the cervical, supraclavicular, or axillary nodes Heart:regular rate and rhythm, without any murmurs or gallops.   Lung: Clear to auscultation without any rhonchi, wheezes or dullness to percussion. Abdomin: Soft, nontender without any shifting dullness or ascites. Musculoskeletal: No clubbing or cyanosis. Neurological: No motor or sensory deficits. Skin: No rashes or lesions.      Lab Results: Lab Results  Component Value Date   WBC 4.1 03/11/2019   HGB 11.9 (L) 03/11/2019   HCT 35.5 (L) 03/11/2019   MCV 94.9 03/11/2019   PLT 38 (L) 03/11/2019     Chemistry      Component Value Date/Time   NA 134 (L) 12/24/2018 0428   NA 138 09/26/2018 1031   NA 141 09/21/2017 1101   K 4.3 12/24/2018 0428   K 4.4 09/21/2017 1101   CL 103 12/24/2018 0428   CO2 22 12/24/2018 0428   CO2 29 09/21/2017 1101   BUN 12 12/24/2018 0428   BUN 16 09/26/2018 1031   BUN 18.7 09/21/2017 1101   CREATININE 0.66 12/24/2018 0428   CREATININE 0.9 09/21/2017 1101      Component Value Date/Time   CALCIUM 8.0 (L) 12/24/2018 0428   CALCIUM 8.7 09/21/2017 1101   ALKPHOS 79 09/21/2017 1101   AST 22 09/21/2017 1101   ALT 16 09/21/2017 1101   BILITOT 0.70 09/21/2017 1101       Impression and Plan:  83 year old man with:       1.  ITP diagnosed in February 2018.  He has platelet counts that have ranged as high as 82,000 and most recently close to 40,000.  The natural course of this disease was reviewed and treatment options were reiterated.  Steroids, IVIG, rituximab were all options to treat this condition.  His platelet count today appears adequate at 73,000 without any bleeding.  At this time I recommended continued active surveillance and intervention only if active bleeding is noted or platelet count below 30,000.   2. Prostate cancer: He is on Xtandi under the  care of Dr. Junious Silk.  No recent complications related to it.  3.  Bullous pemphigoid: Diagnosed in May 2020 after presenting with multiple blisters.  He is following up with dermatology regarding this issue.  If intravenous rituximab is needed in the future, this can be delivered at the cancer center where his convenience.   4.  Melanoma diagnosed and December 2017.  He presented with cutaneous manifestation and remains disease-free.   5. Follow-up: We will be in 3 months for repeat evaluation.   25  minutes was spent with the patient face-to-face today.  More than 50% of time was dedicated to reviewing his laboratory data, disease treatment options and answering questions regarding future plan of care.   Zola Button, MD 6/30/20201:09 PM

## 2019-05-13 NOTE — Telephone Encounter (Signed)
Called and left another voicemail. Told pt to call back and choose the appointment line to answer questions.

## 2019-05-14 ENCOUNTER — Ambulatory Visit: Payer: Medicare Other | Admitting: Podiatry

## 2019-05-14 DIAGNOSIS — Z114 Encounter for screening for human immunodeficiency virus [HIV]: Secondary | ICD-10-CM | POA: Diagnosis not present

## 2019-05-14 DIAGNOSIS — L989 Disorder of the skin and subcutaneous tissue, unspecified: Secondary | ICD-10-CM | POA: Diagnosis not present

## 2019-05-14 DIAGNOSIS — Z1159 Encounter for screening for other viral diseases: Secondary | ICD-10-CM | POA: Diagnosis not present

## 2019-05-14 DIAGNOSIS — L120 Bullous pemphigoid: Secondary | ICD-10-CM | POA: Diagnosis not present

## 2019-05-14 DIAGNOSIS — R21 Rash and other nonspecific skin eruption: Secondary | ICD-10-CM | POA: Diagnosis not present

## 2019-05-14 DIAGNOSIS — Z79899 Other long term (current) drug therapy: Secondary | ICD-10-CM | POA: Diagnosis not present

## 2019-05-14 DIAGNOSIS — L1081 Paraneoplastic pemphigus: Secondary | ICD-10-CM | POA: Diagnosis not present

## 2019-05-14 LAB — PROSTATE-SPECIFIC AG, SERUM (LABCORP): Prostate Specific Ag, Serum: 1.7 ng/mL (ref 0.0–4.0)

## 2019-05-16 DIAGNOSIS — Z4800 Encounter for change or removal of nonsurgical wound dressing: Secondary | ICD-10-CM | POA: Diagnosis not present

## 2019-05-22 DIAGNOSIS — M79671 Pain in right foot: Secondary | ICD-10-CM | POA: Diagnosis not present

## 2019-05-26 ENCOUNTER — Other Ambulatory Visit: Payer: Self-pay | Admitting: Family Medicine

## 2019-05-26 DIAGNOSIS — E785 Hyperlipidemia, unspecified: Secondary | ICD-10-CM

## 2019-05-26 DIAGNOSIS — L120 Bullous pemphigoid: Secondary | ICD-10-CM | POA: Diagnosis not present

## 2019-05-26 DIAGNOSIS — Z114 Encounter for screening for human immunodeficiency virus [HIV]: Secondary | ICD-10-CM | POA: Diagnosis not present

## 2019-05-26 DIAGNOSIS — R21 Rash and other nonspecific skin eruption: Secondary | ICD-10-CM | POA: Diagnosis not present

## 2019-05-26 DIAGNOSIS — Z1159 Encounter for screening for other viral diseases: Secondary | ICD-10-CM | POA: Diagnosis not present

## 2019-05-26 DIAGNOSIS — Z79899 Other long term (current) drug therapy: Secondary | ICD-10-CM | POA: Diagnosis not present

## 2019-06-02 DIAGNOSIS — D485 Neoplasm of uncertain behavior of skin: Secondary | ICD-10-CM | POA: Diagnosis not present

## 2019-06-06 DIAGNOSIS — L120 Bullous pemphigoid: Secondary | ICD-10-CM | POA: Diagnosis not present

## 2019-06-10 DIAGNOSIS — B359 Dermatophytosis, unspecified: Secondary | ICD-10-CM | POA: Diagnosis not present

## 2019-06-12 ENCOUNTER — Ambulatory Visit (INDEPENDENT_AMBULATORY_CARE_PROVIDER_SITE_OTHER): Payer: Medicare Other | Admitting: Family Medicine

## 2019-06-12 ENCOUNTER — Other Ambulatory Visit: Payer: Self-pay

## 2019-06-12 ENCOUNTER — Encounter: Payer: Self-pay | Admitting: Family Medicine

## 2019-06-12 VITALS — BP 110/72 | HR 68 | Temp 98.9°F | Wt 154.0 lb

## 2019-06-12 DIAGNOSIS — K627 Radiation proctitis: Secondary | ICD-10-CM

## 2019-06-12 DIAGNOSIS — I209 Angina pectoris, unspecified: Secondary | ICD-10-CM

## 2019-06-12 DIAGNOSIS — F341 Dysthymic disorder: Secondary | ICD-10-CM

## 2019-06-12 DIAGNOSIS — L120 Bullous pemphigoid: Secondary | ICD-10-CM | POA: Diagnosis not present

## 2019-06-12 DIAGNOSIS — C61 Malignant neoplasm of prostate: Secondary | ICD-10-CM | POA: Diagnosis not present

## 2019-06-12 DIAGNOSIS — K219 Gastro-esophageal reflux disease without esophagitis: Secondary | ICD-10-CM

## 2019-06-12 DIAGNOSIS — C799 Secondary malignant neoplasm of unspecified site: Secondary | ICD-10-CM

## 2019-06-12 DIAGNOSIS — H8110 Benign paroxysmal vertigo, unspecified ear: Secondary | ICD-10-CM | POA: Diagnosis not present

## 2019-06-12 DIAGNOSIS — I25119 Atherosclerotic heart disease of native coronary artery with unspecified angina pectoris: Secondary | ICD-10-CM

## 2019-06-12 DIAGNOSIS — E785 Hyperlipidemia, unspecified: Secondary | ICD-10-CM | POA: Diagnosis not present

## 2019-06-12 NOTE — Progress Notes (Addendum)
Subjective:    Corey Oneill is a 83 y.o. male who presents for a medication check. His concerns today include recent short-term memory loss. States that for the last 6 months, he hasn't accomplished daily tasks that he put in his mental checklist for that day and is unsure why. States when his wife will ask what he did that day and he cannot account for nine hours. States that his energy level is fine and he is motivated to accomplish his tasks. He has a pmh of depression that is being managed by duloxetine for the past several years. Lately, the duloxetine has not been helping his mood. States that there are some days that he should be getting ready to die and that the pandemic has worsened his outlook. Distressed that he works in [employer] but cannot speak to anyone. Currently seeing Loura Halt at [facility], around once a month, and states that it has been helping a bit.  He has a pmh of CAD and aortic stenosis managed by Asprin once daily and carvedilol twice daily as indicated. He also has a pmh of anginal pain managed by isosorbide mononitrate daily and nitroglycerine as needed. He has been taking nitroglycerine 4 out of 7 nights a week for chest discomfort during bedtime while lying down. Does not notice chest pain during the day or with exertion. He usually eats at 8:30 pm and goes to bed at 9:30 pm. He has a pmh of esophageal reflux for which he is taking no medications.   He has prostate cancer that is managed with enzalutamide and is seeing Dr. Alen Blew. Has not noticed any new symptoms since last appointment. 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 Has no hip pain or trouble walking. Uses a cane. Has not noticed blood in stool but has seen blood on wiping. No blood in urine. However, for the last 6 months has noticed increased frequency during the night, urgency, incomplete emptying, and decreased stream. Using Depend underwear. Drinks a glass or two of wine a night.   He has  hyperlipidemia that is managed by Crestor.   Has mh of hemorrhagic blisters on feet and hands. Dermatologist is Dr. Charlyne Quale in [place]. Advised by dermatologist to pop and drain blisters. Also taking halobetasol and bactrim. For pain caused by blisters on feet, he has been taking tramadol once a night and tylenol as needed (most nights). Blisters on feet have almost completely resolved.   Has mh of onychomycosis on hands and feet. Has been taking terbinafine for the past couple weeks with slight improvement. He is picking up a new unknown antifungal today from the pharmacy and is unsure if he should continue his old antifungal alongside the new one.   Alcohol Current alcohol use: Glass of wine at night  Exercise Current exercise habits: Walking everyday   Objective:   Vitals:   06/12/19 0949  Weight: 154 lb (69.9 kg)   Physical Exam  Constitutional: He appears well-developed.  HENT:  Head: Normocephalic and atraumatic.  Neck: Normal range of motion. Neck supple.  Cardiovascular: Normal rate, regular rhythm and intact distal pulses.  Pulmonary/Chest: Effort normal and breath sounds normal.  Skin: Skin is warm and dry.     Assessment:   Plan:   Encounter Diagnoses  Name Primary?  . Dysthymia Yes  . Metastasis from malignant neoplasm of prostate ([place])   . Prostate cancer ([place])   . Hyperlipidemia, unspecified hyperlipidemia type   . Benign paroxysmal positional vertigo,  unspecified laterality   . Gastroesophageal reflux disease without esophagitis   . Radiation proctitis   . Coronary artery disease involving native coronary artery of native heart with angina pectoris ([place])   . Anginal pain ([place])     Short-term memory loss: Conduct mental status exam; result: 28/30, advised patient to write down daily tasks and other important information that he wants to remember and follow-up if symptoms worsen.  Depression: Continue counseling with Dr. Raenette Rover and ask if she advises  medication to be changed based on recent mood.  Chest discomfort: Advised patient to not eat within a couple hours of bedtime and to pick up an otc PPI. Wrote down the medications Nexium or Prilosec to look for at the pharmacy. Advised patient to take daily. Advised pt to look for chest discomfort during the day or on exertion. Follow-up if symptoms worsen.  Urinary issue: Advised patient to follow-up with their urologist  Hyperlipidemia: Continue statin as indicated.  Hemorraghic blisters: Continue following treatment instructions and medications provided by dermatologist.  Onychomycosis: Advised pt to confirm with their dermatologist whether they should continue with their previous terbinafine while taking the new antifungal. Advised pt to ask the pharmacist if they should be taking both at the same time when picking up new antifungal today. Over 45 minutes, greater than 50% spent in counseling and coordination of care. Armandina Stammer MS3  Jill Alexanders, MD   06/12/2019

## 2019-06-16 DIAGNOSIS — R3912 Poor urinary stream: Secondary | ICD-10-CM | POA: Diagnosis not present

## 2019-06-16 DIAGNOSIS — R3915 Urgency of urination: Secondary | ICD-10-CM | POA: Diagnosis not present

## 2019-06-16 DIAGNOSIS — N401 Enlarged prostate with lower urinary tract symptoms: Secondary | ICD-10-CM | POA: Diagnosis not present

## 2019-06-16 DIAGNOSIS — Z192 Hormone resistant malignancy status: Secondary | ICD-10-CM | POA: Diagnosis not present

## 2019-06-16 DIAGNOSIS — C61 Malignant neoplasm of prostate: Secondary | ICD-10-CM | POA: Diagnosis not present

## 2019-06-26 ENCOUNTER — Other Ambulatory Visit: Payer: Self-pay | Admitting: Cardiovascular Disease

## 2019-07-03 ENCOUNTER — Telehealth: Payer: Self-pay | Admitting: Cardiovascular Disease

## 2019-07-03 NOTE — Telephone Encounter (Signed)
Spoke with patient who states he is having chest discomfort at the top of his ribs that is occurring more frequently over the past 3 days, more on than off States discomfort is relieved with NTG; takes 2-3 NTG daily Denies n/v, SOB; denies pain associated with heavy lifting or movement; carried a printer into [employer] today for repair without difficulty Does not monitor BP  I asked if pain is similar to what he described to Dr. Burt Knack in January at last ov and he agrees; states he had relief in symptoms for awhile and now it has returned I asked about medication compliance and he states he is certain that he is taking Imdur.  He has an appointment in September with Pecolia Ades, NP. I advised that I will forward message to Dr. Burt Knack and his primary nurse, Valetta Fuller for advice and that someone from our office will call him back later. He verbalized understanding and agreement with plan and thanked me for the call.

## 2019-07-03 NOTE — Telephone Encounter (Signed)
Agreed thanks. He has a long hx of chest pain.

## 2019-07-03 NOTE — Telephone Encounter (Signed)
Scheduled the patient for OV with Dr. Burt Knack 8/24.  He understands to seek emergent medical attention if symptoms worsen prior to that time. He understands he will be called before appointment if Dr. Burt Knack has other recommendations. He was grateful for assistance.

## 2019-07-03 NOTE — Telephone Encounter (Signed)
New Message   Pt c/o of Chest Pain: STAT if CP now or developed within 24 hours  1. Are you having CP right now? Yes    2. Are you experiencing any other symptoms (ex. SOB, nausea, vomiting, sweating)? No  3. How long have you been experiencing CP? 3 days  4. Is your CP continuous or coming and going? Coming and going (lasted 3 days)  5. Have you taken Nitroglycerin? Yes ?  Patient states that it is not chest pain, it is chest discomfort and it is in the center of his chest and radiates.

## 2019-07-06 ENCOUNTER — Other Ambulatory Visit: Payer: Self-pay | Admitting: Cardiovascular Disease

## 2019-07-07 ENCOUNTER — Encounter: Payer: Self-pay | Admitting: Cardiovascular Disease

## 2019-07-07 ENCOUNTER — Ambulatory Visit
Admission: RE | Admit: 2019-07-07 | Discharge: 2019-07-07 | Disposition: A | Discharge status: Home / Self Care | Payer: Medicare Other | Source: Ambulatory Visit | Attending: Cardiovascular Disease | Admitting: Cardiovascular Disease

## 2019-07-07 ENCOUNTER — Other Ambulatory Visit: Payer: Self-pay

## 2019-07-07 ENCOUNTER — Ambulatory Visit (INDEPENDENT_AMBULATORY_CARE_PROVIDER_SITE_OTHER): Payer: Medicare Other | Admitting: Cardiovascular Disease

## 2019-07-07 VITALS — BP 96/58 | HR 71 | Ht 70.0 in | Wt 152.0 lb

## 2019-07-07 DIAGNOSIS — E782 Mixed hyperlipidemia: Secondary | ICD-10-CM

## 2019-07-07 DIAGNOSIS — R079 Chest pain, unspecified: Secondary | ICD-10-CM

## 2019-07-07 DIAGNOSIS — Z952 Presence of prosthetic heart valve: Secondary | ICD-10-CM | POA: Diagnosis not present

## 2019-07-07 DIAGNOSIS — I2510 Atherosclerotic heart disease of native coronary artery without angina pectoris: Secondary | ICD-10-CM

## 2019-07-07 DIAGNOSIS — R0789 Other chest pain: Secondary | ICD-10-CM | POA: Diagnosis not present

## 2019-07-07 DIAGNOSIS — I209 Angina pectoris, unspecified: Secondary | ICD-10-CM

## 2019-07-07 NOTE — Progress Notes (Signed)
Cardiology Office Note:    Date:  07/07/2019   ID:  Corey Oneill, DOB 27-Apr-1936, MRN [id number]  PCP:  [name], MD  Cardiologist:  [name], MD  Electrophysiologist:  None   Referring MD: [name], MD   Chief Complaint  Patient presents with  . Chest Pain    History of Present Illness:    Corey Oneill is a 83 y.o. male with a hx of coronary artery disease with PCI of the RCA and aortic stenosis status post TAVR in 2017.  He has had chronic atypical angina with most recent cardiac catheterization demonstrating stability of his coronary artery disease and patency of his previously implanted RCA stent. The patient has also been followed for metastatic prostate CA by Dr Junious Silk and Dr Alen Blew, and by hematology for thrombocytopenia.   He has developed discomfort in the epigastrium that spreads into both sides of the chest. He called in with this complaint as was added on to my schedule today. Describes the pain as near-constant for several weeks with no clear exacerbating factors and no change with physical exertion. There is no relationship to food intake.   Past Medical History:  Diagnosis Date  . Abdominal aortic atherosclerosis ([place])   . Anginal pain ([place])   . BPPV (benign paroxysmal positional vertigo)   . CAD (coronary artery disease)    a.  LHC 8/16: Mid to distal LAD 30%, OM1 40%, proximal mid RCA 40%, distal RCA 60% >> FFR 0.69  >> PCI: 3 x 15 mm Resolute DES  . Depression   . Esophageal reflux   . Family history of adverse reaction to anesthesia    "daughter has PONV"  . Grade I diastolic dysfunction [id number]   Noted on ECHO   . H/O blood clots    "get them in my stool and urine" (11/12/2015)  . Heart murmur   . Hepatic lesion 12/08/2015   Stable 8 mm right hepatic lesion  . History of aortic stenosis    a. peak to peak gradient by LHC 8/16:  37 mmHg (moderately severe)   . History of appendicitis 2016  . History of herpes labialis   .  History of ITP    2018, thrombocytopenia  . History of kidney stones   . Hx of radiation therapy 04/14/13-06/09/13   prostate 7800 cGy, 40 sessions, seminal vesicles 5600 cGy 40 sessions  . Hydrocele 2006   Small  . Hyperlipidemia   . Insomnia    resolved  . Malignant melanoma in situ ([place]) 09/04/2016   Right neck and chest  . Metastasis from malignant neoplasm of prostate ([place]) 02/25/2019  . Prostate cancer ([place]) dx'd 2014  . Radiation proctitis   . Renal mass 2017   Bilateral renal masses  . S/P skin biopsy 04/09/2019   subepidermal cell poor vesicle , Linear IGG and C3 the basement membrane  . Wears hearing aid in both ears     Past Surgical History:  Procedure Laterality Date  . CARDIAC CATHETERIZATION N/A 04/21/2015   Procedure: Right/Left Heart Cath and Coronary Angiography;  Surgeon: [name], MD;  Location: [facility];  Service: Cardiovascular;  Laterality: N/A;  . CARDIAC CATHETERIZATION N/A 06/18/2015   Procedure: Intravascular Pressure Wire/FFR Study;  Surgeon: [name], MD;  Location: [facility];  Service: Cardiovascular;  Laterality: N/A;  . CARDIAC CATHETERIZATION N/A 06/18/2015   Procedure: Coronary Stent Intervention;  Surgeon: [name], MD;  Location: [facility]  INVASIVE CV LAB;  Service: Cardiovascular;  Laterality: N/A;  . CARDIAC CATHETERIZATION N/A 06/18/2015   Procedure: Right/Left Heart Cath and Coronary Angiography;  Surgeon: [name], MD;  Location: [facility];  Service: Cardiovascular;  Laterality: N/A;  . CARDIAC CATHETERIZATION N/A 06/23/2015   Procedure: Left Heart Cath and Cors/Grafts Angiography;  Surgeon: [name], MD;  Location: [facility];  Service: Cardiovascular;  Laterality: N/A;  . CARDIAC CATHETERIZATION N/A 01/17/2016   Procedure: Right/Left Heart Cath and Coronary Angiography;  Surgeon: [name], MD;  Location: [facility];  Service: Cardiovascular;  Laterality: N/A;  . CATARACT EXTRACTION,  BILATERAL    . COLONOSCOPY  06/26/2017  . CYSTOSCOPY WITH BIOPSY N/A 07/30/2018   Procedure: CYSTOSCOPY WITH BIOPSY/FULGURATION, CYSTOLTHOLAPAXY;  Surgeon: [name], MD;  Location[facility];  Service: Urology;  Laterality: N/A;  . FRACTURE SURGERY    . INGUINAL HERNIA REPAIR     patient does not remember this procedure  . LAPAROSCOPIC APPENDECTOMY N/A 11/16/2015   Procedure: APPENDECTOMY LAPAROSCOPIC;  Surgeon: [name], MD;  Location: [facility];  Service: General;  Laterality: N/A;  . LEFT HEART CATH AND CORONARY ANGIOGRAPHY N/A 12/26/2016   Procedure: Left Heart Cath and Coronary Angiography;  Surgeon: Troy Sine, MD;  Location: [facility];  Service: Cardiovascular;  Laterality: N/A;  . MELANOMA EXCISION Right 10/24/2016   Procedure: WIDE EXCISION MELANOMA RIGHT NECK AND RIGHT CHEST TIMES 2;  Surgeon: [name], MD;  Location: [facility];  Service: General;  Laterality: Right;  right medial and lateral lesion of chest and right neck  . NASAL FRACTURE SURGERY     "broken years ago; several ORs to correct it"  . NASAL SEPTUM SURGERY    . PROSTATE BIOPSY  2014   "needle biopsy"  . TEE WITHOUT CARDIOVERSION N/A 02/15/2016   Procedure: TRANSESOPHAGEAL ECHOCARDIOGRAM (TEE);  Surgeon: [name], MD;  Location: [facility];  Service: Open Heart Surgery;  Laterality: N/A;  . TONSILLECTOMY    . TRANSCATHETER AORTIC VALVE REPLACEMENT, TRANSFEMORAL  02/15/2016  . TRANSCATHETER AORTIC VALVE REPLACEMENT, TRANSFEMORAL N/A 02/15/2016   Procedure: TRANSCATHETER AORTIC VALVE REPLACEMENT, TRANSFEMORAL;  Surgeon: [name], MD;  Location: [facility];  Service: Open Heart Surgery;  Laterality: N/A;  . TRANSURETHRAL RESECTION OF PROSTATE  12/23/2018   Procedure: CYSTOSCOPY WITH  BLADDER BIOPSY WITH FULGERATION/ TRANSURETHRAL RESECTION PROSTATE  AND PROSTATE BIOPSY;  Surgeon: [name], MD;  Location: [facility];  Service: Urology;;    Current  Medications: Current Meds  Medication Sig  . acetaminophen (TYLENOL) 500 MG tablet Take 500 mg by mouth every 6 (six) hours as needed.  [name] amoxicillin (AMOXIL) 500 MG tablet Take 4 tablets (2,000 mg) one hour prior to ALL dental visits.  [name] aspirin EC 81 MG tablet Take 81 mg by mouth daily.  . carvedilol (COREG) 3.125 MG tablet TAKE 1 TABLET BY MOUTH TWICE DAILY.  . cyanocobalamin 2000 MCG tablet Take 2,000 mcg by mouth daily.  . DULoxetine (CYMBALTA) 30 MG capsule Take 30 mg by mouth daily.  . Enzalutamide (XTANDI PO) Take by mouth.  . ferrous sulfate 325 (65 FE) MG EC tablet Take 1 tablet (325 mg total) by mouth daily with breakfast. If no side effects, change to twice daily after 1 week.  . halobetasol (ULTRAVATE) 0.05 % cream Apply topically 2 (two) times daily.  . isosorbide mononitrate (IMDUR) 30 MG 24 hr tablet Take 1 tablet (30 mg total)  by mouth daily.  . MULTIPLE VITAMIN PO Take 1 tablet by mouth daily.  . nitroGLYCERIN (NITROSTAT) 0.4 MG SL tablet 1 TAB UNDER TONGUE AS NEEDED FOR CHEST PAIN. MAY REPEAT EVERY 5 MIN FOR A TOTAL OF 3 DOSES.  [name] rosuvastatin (CRESTOR) 10 MG tablet TAKE 1 TABLET ONCE DAILY.  [name] sulfamethoxazole-trimethoprim (BACTRIM DS) 800-160 MG tablet Take 1 tablet by mouth 2 (two) times daily.  . TERBINAFINE HCL PO Take 250 mg by mouth.  . traMADol (ULTRAM) 50 MG tablet Take by mouth every 6 (six) hours as needed.  . vortioxetine HBr (TRINTELLIX) 5 MG TABS tablet Take 1 tablet (5 mg total) by mouth daily.  . Zolpidem Tartrate (AMBIEN PO) Take by mouth.     Allergies:   Patient has no known allergies.   Social History   Socioeconomic History  . Marital status: Married    Spouse name: Not on file  . Number of children: 2  . Years of education: Not on file  . Highest education level: Not on file  Occupational History  . Not on file  Social Needs  . Financial resource strain: Not on file  . Food insecurity    Worry: Not on file    Inability: Not on file  .  Transportation needs    Medical: Not on file    Non-medical: Not on file  Tobacco Use  . Smoking status: Former Smoker    Packs/day: 0.00    Types: Cigarettes  . Smokeless tobacco: Never Used  . Tobacco comment: "quit smoking cigarettes in the 1960s; quit smoking cigars in the 1990s  Substance and Sexual Activity  . Alcohol use: Yes    Comment: 2 DRINKS PER DAY  . Drug use: No  . Sexual activity: Not on file  Lifestyle  . Physical activity    Days per week: Not on file    Minutes per session: Not on file  . Stress: Not on file  Relationships  . Social Herbalist on phone: Not on file    Gets together: Not on file    Attends religious service: Not on file    Active member of club or organization: Not on file    Attends meetings of clubs or organizations: Not on file    Relationship status: Not on file  Other Topics Concern  . Not on file  Social History Narrative  . Not on file     Family History: The patient's family history includes Brain cancer in his father; Depression in his daughter; Lymphoma in his mother.  ROS:   Please see the history of present illness.    All other systems reviewed and are negative.  EKGs/Labs/Other Studies Reviewed:    The following studies were reviewed today: 2D Echo 10/03/2018: Left ventricle:  The cavity size was normal. Wall thickness was increased in a pattern of moderate LVH. Systolic function was normal. The estimated ejection fraction was in the range of 60% to 65%. Wall motion was normal; there were no regional wall motion abnormalities. Doppler parameters are consistent with abnormal left ventricular relaxation (grade 1 diastolic dysfunction).  ------------------------------------------------------------------- Aortic valve:  A bioprosthesis was present.  Doppler:   There was no stenosis.   There was no significant regurgitation.  ------------------------------------------------------------------- Aorta:  The  aorta was poorly visualized.  ------------------------------------------------------------------- Mitral valve:   Structurally normal valve.   Leaflet separation was normal.  Doppler:  Transvalvular velocity was within the normal range. There was no evidence  for stenosis. There was trivial regurgitation.    Valve area by pressure half-time: 2.5 cm^2. Indexed valve area by pressure half-time: 1.34 cm^2/m^2.  ------------------------------------------------------------------- Left atrium:  The atrium was normal in size.  ------------------------------------------------------------------- Right ventricle:  The cavity size was normal. Systolic function was normal.  ------------------------------------------------------------------- Pulmonic valve:   Not visualized.  ------------------------------------------------------------------- Tricuspid valve:   Structurally normal valve.   Leaflet separation was normal.  Doppler:  Transvalvular velocity was within the normal range. There was no regurgitation.  ------------------------------------------------------------------- Right atrium:  The atrium was normal in size.  ------------------------------------------------------------------- Pericardium:  There was no pericardial effusion.   ------------------------------------------------------------------- Post procedure conclusions Ascending Aorta:  - The aorta was poorly visualized.  ------------------------------------------------------------------- Measurements   Left ventricle                           Value          Reference  LV ID, ED, PLAX chordal        (L)       30    mm       43 - 52  LV ID, ES, PLAX chordal        (L)       19    mm       23 - 38  LV fx shortening, PLAX chordal           37    %        >=29  LV PW thickness, ED                      17    mm       ----------  IVS/LV PW ratio, ED                      0.82           <=1.3  Stroke volume, 2D                         66    ml       ----------  Stroke volume/bsa, 2D                    35    ml/m^2   ----------  LV e&', lateral                           7.62  cm/s     ----------  LV E/e&', lateral                         6.57           ----------  LV e&', medial                            6.09  cm/s     ----------  LV E/e&', medial                          8.23           ----------  LV e&', average                           6.86  cm/s     ----------  LV E/e&', average                         7.31           ----------    Ventricular septum                       Value          Reference  IVS thickness, ED                        14    mm       ----------    LVOT                                     Value          Reference  LVOT ID, S                               20    mm       ----------  LVOT area                                3.14  cm^2     ----------  LVOT peak velocity, S                    102   cm/s     ----------  LVOT mean velocity, S                    68.5  cm/s     ----------  LVOT VTI, S                              21    cm       ----------    Left atrium                              Value          Reference  LA ID, A-P, ES                           30    mm       ----------  LA ID/bsa, A-P                           1.61  cm/m^2   <=2.2  LA volume, S                             35.2  ml       ----------  LA volume/bsa, S                         18.8  ml/m^2   ----------  LA volume, ES, 1-p A4C                   24.9  ml       ----------  LA volume/bsa, ES, 1-p A4C               13.3  ml/m^2   ----------  LA volume, ES, 1-p A2C                   45.3  ml       ----------  LA volume/bsa, ES, 1-p A2C               24.3  ml/m^2   ----------    Mitral valve                             Value          Reference  Mitral E-wave peak velocity              50.1  cm/s     ----------  Mitral A-wave peak velocity              84.8  cm/s     ----------  Mitral deceleration time       (H)        299   ms       150 - 230  Mitral pressure half-time                88    ms       ----------  Mitral E/A ratio, peak                   0.6            ----------  Mitral valve area, PHT, DP               2.5   cm^2     ----------  Mitral valve area/bsa, PHT, DP           1.34  cm^2/m^2 ----------    Right atrium                             Value          Reference  RA ID, S-I, ES, A4C                      46.5  mm       34 - 49  RA area, ES, A4C                         9.54  cm^2     8.3 - 19.5  RA volume, ES, A/L                       16    ml       ----------  RA volume/bsa, ES, A/L                   8.6   ml/m^2   ----------    Right ventricle                          Value          Reference  RV s&', lateral, S                        12.6  cm/s     ----------  Stress Echocardiogram 10/08/2018:  Study Conclusions  - Stress ECG conclusions: There were no stress arrhythmias or   conduction abnormalities. The stress ECG was negative for   ischemia. [facility] scoring: exercise time of 5 min; maximum ST   deviation of 0 mm; no angina; resulting score is 5. This score   predicts a low risk of cardiac events. - Staged echo: There was no echocardiographic evidence for   stress-induced ischemia.  Impressions:  - Low risk stress test. No exercise induced wall motion   abnormalities.  EKG:  EKG is ordered today.  The ekg ordered today demonstrates NSR 71 bpm, PAC's, otherwise normal  Recent Labs: 05/13/2019: ALT 9; BUN 18; Creatinine 0.95; Hemoglobin 12.1; Platelet Count 72; Potassium 4.0; Sodium 138  Recent Lipid Panel    Component Value Date/Time   CHOL 182 07/05/2017 0957   TRIG 67 07/05/2017 0957   HDL 61 07/05/2017 0957   CHOLHDL 3.0 07/05/2017 0957   VLDL 13 07/05/2017 0957   LDLCALC 108 (H) 07/05/2017 0957    Physical Exam:    VS:  BP (!) 96/58   Pulse 71   Ht 5\' 10"  (1.778 m)   Wt 152 lb (68.9 kg)   SpO2 96%   BMI 21.81 kg/m     Wt Readings from Last 3  Encounters:  07/07/19 152 lb (68.9 kg)  06/12/19 154 lb (69.9 kg)  05/13/19 160 lb 4.8 oz (72.7 kg)     GEN:  Thin, elderly male in no acute distress HEENT: Normal NECK: No JVD; No carotid bruits LYMPHATICS: No lymphadenopathy CARDIAC: RRR, 1/6 systolic ejection murmur at the right upper sternal border, no diastolic murmur RESPIRATORY:  Clear to auscultation without rales, wheezing or rhonchi  ABDOMEN: Soft, non-tender, non-distended MUSCULOSKELETAL:  No edema; No deformity  SKIN: Warm and dry NEUROLOGIC:  Alert and oriented x 3 PSYCHIATRIC:  Normal affect   ASSESSMENT:    1. Coronary artery disease involving native coronary artery of native heart without angina pectoris   2. Mixed hyperlipidemia   3. S/P TAVR (transcatheter aortic valve replacement)    PLAN:    In order of problems listed above:  1. The patient's chest pain is highly atypical.  It is near constant nature with no relationship to physical activity does not suggest typical angina related to coronary artery disease.  He had a stress echocardiogram in November which showed no evidence of ischemia.  I recommend that he continue on low-dose aspirin, low-dose carvedilol, and a statin drug with Crestor 10 mg daily.  I have ordered a chest x-ray to evaluate for chest wall abnormality as a cause of his pain. 2. Treated with Crestor 10 mg daily.  Most recent lipids reviewed.  LFTs from May 13, 2023 within normal limits. 3. The patient's aortic valve prosthesis appears to be functioning normally.  Most recent echocardiogram from last year is reviewed as above.  He remains on aspirin for antiplatelet therapy and he follows SBE prophylaxis per guideline recommendations.   Medication Adjustments/Labs and Tests Ordered: Current medicines are reviewed at length with the patient today.  Concerns regarding medicines are outlined above.  No orders of the defined types were placed in this encounter.  No orders of the defined types  were placed in this encounter.   There are no Patient Instructions on file for this visit.   Signed, [name], MD  07/07/2019 8:47 AM    [place]

## 2019-07-07 NOTE — Patient Instructions (Signed)
Medication Instructions:  Your provider recommends that you continue on your current medications as directed. Please refer to the Current Medication list given to you today.    Labwork: None  Testing/Procedures: Dr. Burt Knack recommends you have a CHEST XRAY. Please proceed to [facility] to complete.  Address: [address], [place], [place] [address] Phone: [phone]  Follow-Up: Your provider wants you to follow-up in: 6 months with Dr. Burt Knack. You will receive a reminder letter in the mail two months in advance. If you don't receive a letter, please call our office to schedule the follow-up appointment.

## 2019-07-08 DIAGNOSIS — B359 Dermatophytosis, unspecified: Secondary | ICD-10-CM | POA: Diagnosis not present

## 2019-07-14 ENCOUNTER — Encounter: Payer: Self-pay | Admitting: Family Medicine

## 2019-07-14 ENCOUNTER — Ambulatory Visit (INDEPENDENT_AMBULATORY_CARE_PROVIDER_SITE_OTHER): Payer: Medicare Other | Admitting: Family Medicine

## 2019-07-14 ENCOUNTER — Other Ambulatory Visit: Payer: Medicare Other

## 2019-07-14 ENCOUNTER — Other Ambulatory Visit: Payer: Self-pay

## 2019-07-14 VITALS — BP 100/68 | HR 67 | Temp 98.1°F | Wt 153.4 lb

## 2019-07-14 DIAGNOSIS — R32 Unspecified urinary incontinence: Secondary | ICD-10-CM

## 2019-07-14 DIAGNOSIS — Z23 Encounter for immunization: Secondary | ICD-10-CM

## 2019-07-14 DIAGNOSIS — I209 Angina pectoris, unspecified: Secondary | ICD-10-CM

## 2019-07-14 DIAGNOSIS — C799 Secondary malignant neoplasm of unspecified site: Secondary | ICD-10-CM

## 2019-07-14 DIAGNOSIS — R1013 Epigastric pain: Secondary | ICD-10-CM | POA: Diagnosis not present

## 2019-07-14 DIAGNOSIS — C61 Malignant neoplasm of prostate: Secondary | ICD-10-CM

## 2019-07-14 NOTE — Progress Notes (Signed)
   Subjective:    Patient ID: Corey Oneill, male    DOB: 03/07/1936, 83 y.o.   MRN: [id number]  HPI He complains of a several week history of mid epigastric pain.  He was seen recently by cardiology and nothing of any cardiac origin was noted.  The pain seems to be pretty constant, food and activity have no effect on it.  No nausea, vomiting, chest pain, abdominal pain, early satiety.  He does have several alcoholic beverages per night. He has had difficulty with urinary incontinence.  Does have an underlying history of metastatic prostate cancer.  He has been using Depends and is noted that even that sometimes is not enough.   Review of Systems     Objective:   Physical Exam Alert and in no distress. Tympanic membranes and canals are normal. Pharyngeal area is normal. Neck is supple without adenopathy or thyromegaly. Cardiac exam shows a regular sinus rhythm without murmurs or gallops. Lungs are clear to auscultation. Abdominal exam shows decreased bowel sounds and slight mid epigastric tenderness.       Assessment & Plan:  Midepigastric pain - Plan: CBC with Differential/Platelet, Comprehensive metabolic panel, Amylase, Lipase  Need for influenza vaccination - Plan: Flu Vaccine QUAD High Dose(Fluad)  Metastasis from malignant neoplasm of prostate ([place]) - Plan: CBC with Differential/Platelet, Comprehensive metabolic panel  Urinary incontinence, unspecified type I am not sure what is causing him midepigastric pain but need to rule out pancreatic etiology.  If negative may see what a PPI will do for his symptoms.

## 2019-07-15 LAB — CBC WITH DIFFERENTIAL/PLATELET
Basophils Absolute: 0 10*3/uL (ref 0.0–0.2)
Basos: 0 %
EOS (ABSOLUTE): 0 10*3/uL (ref 0.0–0.4)
Eos: 1 %
Hematocrit: 33.8 % — ABNORMAL LOW (ref 37.5–51.0)
Hemoglobin: 11.8 g/dL — ABNORMAL LOW (ref 13.0–17.7)
Immature Grans (Abs): 0 10*3/uL (ref 0.0–0.1)
Immature Granulocytes: 0 %
Lymphocytes Absolute: 0.9 10*3/uL (ref 0.7–3.1)
Lymphs: 30 %
MCH: 33.6 pg — ABNORMAL HIGH (ref 26.6–33.0)
MCHC: 34.9 g/dL (ref 31.5–35.7)
MCV: 96 fL (ref 79–97)
Monocytes Absolute: 0.4 10*3/uL (ref 0.1–0.9)
Monocytes: 15 %
Neutrophils Absolute: 1.6 10*3/uL (ref 1.4–7.0)
Neutrophils: 54 %
Platelets: 47 10*3/uL — CL (ref 150–450)
RBC: 3.51 x10E6/uL — ABNORMAL LOW (ref 4.14–5.80)
RDW: 11.9 % (ref 11.6–15.4)
WBC: 3 10*3/uL — ABNORMAL LOW (ref 3.4–10.8)

## 2019-07-15 LAB — COMPREHENSIVE METABOLIC PANEL
ALT: 8 IU/L (ref 0–44)
AST: 18 IU/L (ref 0–40)
Albumin/Globulin Ratio: 2 (ref 1.2–2.2)
Albumin: 4.1 g/dL (ref 3.6–4.6)
Alkaline Phosphatase: 63 IU/L (ref 39–117)
BUN/Creatinine Ratio: 20 (ref 10–24)
BUN: 19 mg/dL (ref 8–27)
Bilirubin Total: 0.5 mg/dL (ref 0.0–1.2)
CO2: 26 mmol/L (ref 20–29)
Calcium: 8.9 mg/dL (ref 8.6–10.2)
Chloride: 103 mmol/L (ref 96–106)
Creatinine, Ser: 0.94 mg/dL (ref 0.76–1.27)
GFR calc Af Amer: 88 mL/min/{1.73_m2} (ref >59)
GFR calc non Af Amer: 76 mL/min/{1.73_m2} (ref >59)
Globulin, Total: 2.1 g/dL (ref 1.5–4.5)
Glucose: 87 mg/dL (ref 65–99)
Potassium: 4.3 mmol/L (ref 3.5–5.2)
Sodium: 142 mmol/L (ref 134–144)
Total Protein: 6.2 g/dL (ref 6.0–8.5)

## 2019-07-15 LAB — AMYLASE: Amylase: 71 U/L (ref 31–110)

## 2019-07-15 LAB — LIPASE: Lipase: 15 U/L (ref 13–78)

## 2019-07-16 DIAGNOSIS — R350 Frequency of micturition: Secondary | ICD-10-CM | POA: Diagnosis not present

## 2019-07-16 DIAGNOSIS — R3914 Feeling of incomplete bladder emptying: Secondary | ICD-10-CM | POA: Diagnosis not present

## 2019-07-16 DIAGNOSIS — N401 Enlarged prostate with lower urinary tract symptoms: Secondary | ICD-10-CM | POA: Diagnosis not present

## 2019-07-20 ENCOUNTER — Emergency Department (HOSPITAL_COMMUNITY)
Admission: EM | Admit: 2019-07-20 | Discharge: 2019-07-20 | Disposition: A | Discharge status: Home / Self Care | Payer: Medicare Other | Attending: Emergency Medicine | Admitting: Emergency Medicine

## 2019-07-20 ENCOUNTER — Encounter (HOSPITAL_COMMUNITY): Payer: Self-pay

## 2019-07-20 ENCOUNTER — Emergency Department (HOSPITAL_COMMUNITY): Payer: Medicare Other

## 2019-07-20 DIAGNOSIS — U071 COVID-19: Secondary | ICD-10-CM | POA: Diagnosis not present

## 2019-07-20 DIAGNOSIS — Z7982 Long term (current) use of aspirin: Secondary | ICD-10-CM | POA: Insufficient documentation

## 2019-07-20 DIAGNOSIS — R339 Retention of urine, unspecified: Secondary | ICD-10-CM | POA: Diagnosis not present

## 2019-07-20 DIAGNOSIS — Z87891 Personal history of nicotine dependence: Secondary | ICD-10-CM | POA: Insufficient documentation

## 2019-07-20 DIAGNOSIS — R195 Other fecal abnormalities: Secondary | ICD-10-CM | POA: Diagnosis not present

## 2019-07-20 DIAGNOSIS — I2510 Atherosclerotic heart disease of native coronary artery without angina pectoris: Secondary | ICD-10-CM | POA: Insufficient documentation

## 2019-07-20 DIAGNOSIS — Z79899 Other long term (current) drug therapy: Secondary | ICD-10-CM | POA: Diagnosis not present

## 2019-07-20 DIAGNOSIS — N35912 Unspecified bulbous urethral stricture, male: Secondary | ICD-10-CM | POA: Diagnosis not present

## 2019-07-20 DIAGNOSIS — R338 Other retention of urine: Secondary | ICD-10-CM | POA: Diagnosis not present

## 2019-07-20 DIAGNOSIS — Z20828 Contact with and (suspected) exposure to other viral communicable diseases: Secondary | ICD-10-CM | POA: Insufficient documentation

## 2019-07-20 DIAGNOSIS — C61 Malignant neoplasm of prostate: Secondary | ICD-10-CM | POA: Diagnosis not present

## 2019-07-20 DIAGNOSIS — Z03818 Encounter for observation for suspected exposure to other biological agents ruled out: Secondary | ICD-10-CM | POA: Diagnosis not present

## 2019-07-20 LAB — BASIC METABOLIC PANEL
Anion gap: 10 (ref 5–15)
BUN: 19 mg/dL (ref 8–23)
CO2: 23 mmol/L (ref 22–32)
Calcium: 8.4 mg/dL — ABNORMAL LOW (ref 8.9–10.3)
Chloride: 108 mmol/L (ref 98–111)
Creatinine, Ser: 0.81 mg/dL (ref 0.61–1.24)
GFR calc Af Amer: >60 mL/min (ref >60)
GFR calc non Af Amer: >60 mL/min (ref >60)
Glucose, Bld: 100 mg/dL — ABNORMAL HIGH (ref 70–99)
Potassium: 4.1 mmol/L (ref 3.5–5.1)
Sodium: 141 mmol/L (ref 135–145)

## 2019-07-20 LAB — CBC
HCT: 39.7 % (ref 39.0–52.0)
Hemoglobin: 13.2 g/dL (ref 13.0–17.0)
MCH: 33.5 pg (ref 26.0–34.0)
MCHC: 33.2 g/dL (ref 30.0–36.0)
MCV: 100.8 fL — ABNORMAL HIGH (ref 80.0–100.0)
Platelets: 49 10*3/uL — ABNORMAL LOW (ref 150–400)
RBC: 3.94 MIL/uL — ABNORMAL LOW (ref 4.22–5.81)
RDW: 12.2 % (ref 11.5–15.5)
WBC: 5.2 10*3/uL (ref 4.0–10.5)
nRBC: 0 % (ref 0.0–0.2)

## 2019-07-20 LAB — POC OCCULT BLOOD, ED: Fecal Occult Bld: POSITIVE — AB

## 2019-07-20 LAB — PROTIME-INR
INR: 1.2 (ref 0.8–1.2)
Prothrombin Time: 14.9 seconds (ref 11.4–15.2)

## 2019-07-20 LAB — SARS CORONAVIRUS 2 BY RT PCR (HOSPITAL ORDER, PERFORMED IN ~~LOC~~ HOSPITAL LAB): SARS Coronavirus 2: NEGATIVE

## 2019-07-20 MED ORDER — FENTANYL CITRATE (PF) 100 MCG/2ML IJ SOLN
INTRAMUSCULAR | Status: AC | PRN
  Administered 2019-07-20: 25 ug via INTRAVENOUS

## 2019-07-20 MED ORDER — MORPHINE SULFATE (PF) 4 MG/ML IV SOLN
4.0000 mg | Freq: Once | INTRAVENOUS | Status: AC
  Administered 2019-07-20: 15:00:00 4 mg via INTRAVENOUS
  Filled 2019-07-20: qty 1

## 2019-07-20 MED ORDER — LORAZEPAM 2 MG/ML IJ SOLN
INTRAMUSCULAR | Status: AC | PRN
  Administered 2019-07-20: 0.5 mg via INTRAVENOUS

## 2019-07-20 MED ORDER — MIDAZOLAM HCL 2 MG/2ML IJ SOLN
INTRAMUSCULAR | Status: AC | PRN
  Administered 2019-07-20: 0.5 mg via INTRAVENOUS

## 2019-07-20 MED ORDER — MIDAZOLAM HCL 2 MG/2ML IJ SOLN
INTRAMUSCULAR | Status: AC
  Filled 2019-07-20: qty 2

## 2019-07-20 MED ORDER — MORPHINE SULFATE (PF) 4 MG/ML IV SOLN
4.0000 mg | Freq: Once | INTRAVENOUS | Status: AC
  Administered 2019-07-20: 4 mg via INTRAVENOUS
  Filled 2019-07-20: qty 1

## 2019-07-20 MED ORDER — CIPROFLOXACIN IN D5W 400 MG/200ML IV SOLN
400.0000 mg | Freq: Once | INTRAVENOUS | Status: AC
  Administered 2019-07-20: 18:00:00 400 mg via INTRAVENOUS
  Filled 2019-07-20: qty 200

## 2019-07-20 MED ORDER — FENTANYL CITRATE (PF) 100 MCG/2ML IJ SOLN
INTRAMUSCULAR | Status: AC
  Filled 2019-07-20: qty 2

## 2019-07-20 NOTE — Discharge Instructions (Signed)
Suprapubic Catheter Home Guide °A suprapubic catheter is a flexible tube that is used to drain urine from the bladder into a collection bag outside the body. The catheter is inserted into the bladder through a small opening in the lower abdomen, above the pubic bone (suprapubic area) and a few inches below your belly button (navel). A tiny balloon filled with germ-free (sterile) water helps to keep the catheter in place. °The collection bag must be emptied at least once a day and cleaned at least every other day. The collection bag can be put beside your bed at night and attached to your leg during the day. You may have a large collection bag to use at night and a smaller one to use during the day. °Your suprapubic catheter may need to be changed every 4-6 weeks, or as often as recommended by your health care provider. Healing of the tract where the catheter is placed can take 6 weeks to 6 months. During that time, your health care provider may change your catheter. Once the tract is well healed, you or a caregiver will change your suprapubic catheter at home. °What are the risks? °This catheter is safe to use. However, problems can occur, including: °· Blocked urine flow. This can occur if the catheter stops working, or if you have a blood clot in your bladder or in the catheter. °· Irritation of the skin around the catheter. °· Infection. This can happen if bacteria gets into your bladder. °Supplies needed: °· Two pairs of sterile gloves. °· Paper towels. °· Catheter. °· Two syringes. °· Sterile water. °· Sterile cleaning solution. °· Lubricant. °· Collection bags. °How to change the catheter ° °1. Drink plenty of fluids during the hours before you change the catheter. °2. Wash your hands with soap and water. If soap and water are not available, use hand sanitizer. °3. Draw up sterile water into a syringe to have ready to fill the new catheter balloon. The amount will depend on the size of the balloon. °4. Have  all of your supplies ready and close to you on a paper towel. °5. Lie on your back, sitting slightly upright so that you can see the catheter and opening. °6. Put on sterile gloves. °7. Clean the skin around the catheter opening using the sterile cleaning solution. °8. Remove the water from the balloon in the catheter using a syringe. °9. Slowly remove the catheter. If the catheter seems stuck, or if you have difficulty removing it: °? Do not pull on it. °? Call your health care provider right away. °10. Place the old catheter on a paper towel to discard later. °11. Take off the used gloves, and put on a new pair. °12. Put lubricant on the end of the new catheter that will go into your bladder. °13. Clean the skin around the catheter opening using the sterile cleaning solution. °14. Gently slide the catheter through the opening in your abdomen and into the tract that leads to your bladder. °15. Wait for some urine to start flowing through the catheter. °16. When urine starts to flow through the catheter, attach the collection bag to the end of the catheter. Make sure the connection is tight. °17. Use a syringe to fill the catheter balloon with sterile water. Fill to the amount directed by your health care provider. °18. Remove the gloves and wash your hands with soap and water. °How to care for the skin around the catheter °Follow your health care provider's instructions on   caring for your skin. °· Use a clean washcloth and soapy water to clean the skin around your catheter every day. Pat the area dry with a clean paper towel. °· Do not pull on the catheter. °· Do not use ointment or lotion on this area, unless told by your health care provider. °· Check the skin around the catheter every day for signs of infection. Check for: °? Redness, swelling, or pain. °? Fluid or blood. °? Warmth. °? Pus or a bad smell. °How to empty and clean the collection bag °Empty the large collection bag every 8 hours. Empty the small  collection bag when it is about ? full. Clean the collection bag every 2-3 days, or as often as told by your health care provider. To do this: °1. Wash your hands with soap and water. If soap and water are not available, use hand sanitizer. °2. Disconnect the bag from the catheter and immediately attach a new bag to the catheter. °3. Hold the used bag over the toilet or another container. °4. Turn the valve (spigot) at the bottom of the bag to empty the urine. Empty the used bag completely. °? Do not touch the opening of the spigot. °? Do not let the opening touch the toilet or container. °5. Close the spigot tightly when the bag is empty. °6. Clean the used bag in one of the following methods: °? According to the manufacturer's instructions. °? As told by your health care provider. °7. Let the bag dry completely. Put it in a clean plastic bag before storing it. °General tips ° °· Always wash your hands before and after caring for your catheter and collection bag. Use a mild, fragrance-free soap. If soap and water are not available, use hand sanitizer. °· Clean the outside of the catheter with soap and water as often as told by your health care provider. °· Always make sure there are no twists or kinks in the catheter tube. °· Always make sure there are no leaks in the catheter or collection bag. °· Always wear the leg bag below your knee. °· Make sure the overnight drainage bag is always lower than the level of your bladder, but do not let it touch the floor. Before you go to sleep, hang the bag inside a wastebasket that is covered by a clean plastic bag. °· Drink enough fluid to keep your urine pale yellow. °· Do not take baths, swim, or use a hot tub until your health care provider approves. Ask your health care provider if you may take showers. °Contact a heath care provider if: °· You leak urine. °· You have redness, swelling, or pain around your catheter. °· You have fluid or blood coming from your catheter  opening. °· Your catheter opening feels warm to the touch. °· You have pus or a bad smell coming from your catheter opening. °· You have a fever or chills. °· Your urine flow slows down. °· Your urine becomes cloudy or smelly. °Get help right away if: °· Your catheter comes out. °· You have: °? Nausea. °? Back pain. °? Difficulty changing your catheter. °? Blood in your urine. °? No urine flow for 1 hour. °Summary °· A suprapubic catheter is a flexible tube that is used to drain urine from the bladder into a collection bag outside the body. °· Your suprapubic catheter may need to be changed every 4-6 weeks, or as recommended by your health care provider. °· Follow instructions on how to   change the catheter and how to empty and clean the collection bag.  Always wash your hands before and after caring for your catheter and collection bag. Drink enough fluid to keep your urine pale yellow.  Get help right away if you have difficulty changing your catheter or if there is blood in your urine. This information is not intended to replace advice given to you by your health care provider. Make sure you discuss any questions you have with your health care provider. Document Released: 07/18/2011 Document Revised: 02/20/2019 Document Reviewed: 12/04/2018 Elsevier Patient Education  2020 [employer].

## 2019-07-20 NOTE — ED Notes (Signed)
RN Seth Bake and this RN attempted straight catheter and coude and were unable to get past the prostate. MD knapp made aware.

## 2019-07-20 NOTE — Consult Note (Signed)
Chief Complaint: Urinary retention, inability to Place Foley catheter  Referring Physician(s): Jeffie Pollock (Urology)  Patient Status: [facility] - ED  History of Present Illness: Corey Oneill is a 83 y.o. male with past medical history significant for hyperlipidemia atherosclerosis and prostate cancer who presented to the emergency department with urinary retention post failed attempted cystoscopic placement of a Foley catheter.  As such, request made for placement of an image guided suprapubic catheter for urinary diversion purposes.  Patient complains of expect lower abdominal/pelvic pain.  Patient reports minimal associated fecal incontinence.  Patient denies fever or chills.  No chest pain or shortness of breath.  Patient has tested positive for COVID-19.  Past Medical History:  Diagnosis Date  . Abdominal aortic atherosclerosis ([place])   . Anginal pain ([place])   . BPPV (benign paroxysmal positional vertigo)   . CAD (coronary artery disease)    a.  LHC 8/16: Mid to distal LAD 30%, OM1 40%, proximal mid RCA 40%, distal RCA 60% >> FFR 0.69  >> PCI: 3 x 15 mm Resolute DES  . Depression   . Esophageal reflux   . Family history of adverse reaction to anesthesia    "daughter has PONV"  . Grade I diastolic dysfunction [id number]   Noted on ECHO   . H/O blood clots    "get them in my stool and urine" (11/12/2015)  . Heart murmur   . Hepatic lesion 12/08/2015   Stable 8 mm right hepatic lesion  . History of aortic stenosis    a. peak to peak gradient by LHC 8/16:  37 mmHg (moderately severe)   . History of appendicitis 2016  . History of herpes labialis   . History of ITP    2018, thrombocytopenia  . History of kidney stones   . Hx of radiation therapy 04/14/13-06/09/13   prostate 7800 cGy, 40 sessions, seminal vesicles 5600 cGy 40 sessions  . Hydrocele 2006   Small  . Hyperlipidemia   . Insomnia    resolved  . Malignant melanoma in situ ([place]) 09/04/2016   Right neck and chest  .  Metastasis from malignant neoplasm of prostate ([place]) 02/25/2019  . Prostate cancer ([place]) dx'd 2014  . Radiation proctitis   . Renal mass 2017   Bilateral renal masses  . S/P skin biopsy 04/09/2019   subepidermal cell poor vesicle , Linear IGG and C3 the basement membrane  . Wears hearing aid in both ears     Past Surgical History:  Procedure Laterality Date  . CARDIAC CATHETERIZATION N/A 04/21/2015   Procedure: Right/Left Heart Cath and Coronary Angiography;  Surgeon: [name], MD;  Location: [place] [facility];  Service: Cardiovascular;  Laterality: N/A;  . CARDIAC CATHETERIZATION N/A 06/18/2015   Procedure: Intravascular Pressure Wire/FFR Study;  Surgeon: [name], MD;  Location: [place] [facility];  Service: Cardiovascular;  Laterality: N/A;  . CARDIAC CATHETERIZATION N/A 06/18/2015   Procedure: Coronary Stent Intervention;  Surgeon: [name], MD;  Location: [place] [facility];  Service: Cardiovascular;  Laterality: N/A;  . CARDIAC CATHETERIZATION N/A 06/18/2015   Procedure: Right/Left Heart Cath and Coronary Angiography;  Surgeon: [name], MD;  Location: [place] [facility];  Service: Cardiovascular;  Laterality: N/A;  . CARDIAC CATHETERIZATION N/A 06/23/2015   Procedure: Left Heart Cath and Cors/Grafts Angiography;  Surgeon: [name], MD;  Location: [place] [facility];  Service: Cardiovascular;  Laterality: N/A;  . CARDIAC CATHETERIZATION N/A 01/17/2016   Procedure: Right/Left  Heart Cath and Coronary Angiography;  Surgeon: [name], MD;  Location: [place] [facility];  Service: Cardiovascular;  Laterality: N/A;  . CATARACT EXTRACTION, BILATERAL    . COLONOSCOPY  06/26/2017  . CYSTOSCOPY WITH BIOPSY N/A 07/30/2018   Procedure: CYSTOSCOPY WITH BIOPSY/FULGURATION, CYSTOLTHOLAPAXY;  Surgeon: [name], MD;  Location: [facility];  Service: Urology;  Laterality: N/A;  . FRACTURE SURGERY    . INGUINAL HERNIA REPAIR     patient does not remember  this procedure  . LAPAROSCOPIC APPENDECTOMY N/A 11/16/2015   Procedure: APPENDECTOMY LAPAROSCOPIC;  Surgeon: [name], MD;  Location: [place];  Service: General;  Laterality: N/A;  . LEFT HEART CATH AND CORONARY ANGIOGRAPHY N/A 12/26/2016   Procedure: Left Heart Cath and Coronary Angiography;  Surgeon: Troy Sine, MD;  Location: [place] [facility];  Service: Cardiovascular;  Laterality: N/A;  . MELANOMA EXCISION Right 10/24/2016   Procedure: WIDE EXCISION MELANOMA RIGHT NECK AND RIGHT CHEST TIMES 2;  Surgeon: [name], MD;  Location: [place];  Service: General;  Laterality: Right;  right medial and lateral lesion of chest and right neck  . NASAL FRACTURE SURGERY     "broken years ago; several ORs to correct it"  . NASAL SEPTUM SURGERY    . PROSTATE BIOPSY  2014   "needle biopsy"  . TEE WITHOUT CARDIOVERSION N/A 02/15/2016   Procedure: TRANSESOPHAGEAL ECHOCARDIOGRAM (TEE);  Surgeon: [name], MD;  Location: [place];  Service: Open Heart Surgery;  Laterality: N/A;  . TONSILLECTOMY    . TRANSCATHETER AORTIC VALVE REPLACEMENT, TRANSFEMORAL  02/15/2016  . TRANSCATHETER AORTIC VALVE REPLACEMENT, TRANSFEMORAL N/A 02/15/2016   Procedure: TRANSCATHETER AORTIC VALVE REPLACEMENT, TRANSFEMORAL;  Surgeon: [name], MD;  Location: [place];  Service: Open Heart Surgery;  Laterality: N/A;  . TRANSURETHRAL RESECTION OF PROSTATE  12/23/2018   Procedure: CYSTOSCOPY WITH  BLADDER BIOPSY WITH FULGERATION/ TRANSURETHRAL RESECTION PROSTATE  AND PROSTATE BIOPSY;  Surgeon: [name], MD;  Location: [facility];  Service: Urology;;    Allergies: Patient has no known allergies.  Medications: Prior to Admission medications   Medication Sig Start Date End Date Taking? Authorizing Provider  acetaminophen (TYLENOL) 500 MG tablet Take 500 mg by mouth every 6 (six) hours as needed for moderate pain.    Yes [provider]  aspirin EC 81 MG tablet Take 81 mg by mouth at  bedtime.    Yes [provider]  carvedilol (COREG) 3.125 MG tablet TAKE 1 TABLET BY MOUTH TWICE DAILY. Patient taking differently: Take 3.125 mg by mouth 2 (two) times daily.  06/26/19  Yes [name], MD  cyanocobalamin 2000 MCG tablet Take 2,000 mcg by mouth daily.   Yes [provider]  DULoxetine (CYMBALTA) 30 MG capsule Take 30 mg by mouth daily.   Yes [provider]  Enzalutamide (XTANDI PO) Take 4 tablets by mouth daily.    Yes [provider]  enzalutamide (XTANDI) 40 MG capsule Take 160 mg by mouth daily.   Yes [provider]  ferrous sulfate 325 (65 FE) MG EC tablet Take 1 tablet (325 mg total) by mouth daily with breakfast. If no side effects, change to twice daily after 1 week. 12/24/18  Yes Bonnielee Haff, MD  isosorbide mononitrate (IMDUR) 30 MG 24 hr tablet Take 1 tablet (30 mg total) by mouth daily. 10/22/18 10/22/19 Yes Weaver, Scott T, PA-C  MULTIPLE VITAMIN PO Take 1 tablet by mouth daily.   Yes [provider]  rosuvastatin (CRESTOR) 10 MG tablet TAKE 1 TABLET ONCE DAILY. Patient taking differently: Take 10 mg by mouth daily.  05/26/19  Yes Tysinger, Camelia Eng, PA-C  tamsulosin (FLOMAX) 0.4 MG CAPS capsule Take 0.4 mg by mouth daily. 07/07/19  Yes [provider]  amoxicillin (AMOXIL) 500 MG tablet Take 4 tablets (2,000 mg) one hour prior to ALL dental visits. 01/15/19   [name], MD  nitroGLYCERIN (NITROSTAT) 0.4 MG SL tablet 1 TAB UNDER TONGUE AS NEEDED FOR CHEST PAIN. MAY REPEAT EVERY 5 MIN FOR A TOTAL OF 3 DOSES. Patient taking differently: Place 0.4 mg under the tongue every 5 (five) minutes as needed for chest pain.  07/07/19   [name], MD  vortioxetine HBr (TRINTELLIX) 5 MG TABS tablet Take 1 tablet (5 mg total) by mouth daily. Patient not taking: Reported on 07/20/2019 03/05/19   [name], MD     Family History  Problem Relation Age of Onset  . Brain cancer Father   . Lymphoma Mother    . Depression Daughter     Social History   Socioeconomic History  . Marital status: Married    Spouse name: Not on file  . Number of children: 2  . Years of education: Not on file  . Highest education level: Not on file  Occupational History  . Not on file  Social Needs  . Financial resource strain: Not on file  . Food insecurity    Worry: Not on file    Inability: Not on file  . Transportation needs    Medical: Not on file    Non-medical: Not on file  Tobacco Use  . Smoking status: Former Smoker    Packs/day: 0.00    Types: Cigarettes  . Smokeless tobacco: Never Used  . Tobacco comment: "quit smoking cigarettes in the 1960s; quit smoking cigars in the 1990s  Substance and Sexual Activity  . Alcohol use: Yes    Comment: 2 DRINKS PER DAY  . Drug use: No  . Sexual activity: Not on file  Lifestyle  . Physical activity    Days per week: Not on file    Minutes per session: Not on file  . Stress: Not on file  Relationships  . Social Herbalist on phone: Not on file    Gets together: Not on file    Attends religious service: Not on file    Active member of club or organization: Not on file    Attends meetings of clubs or organizations: Not on file    Relationship status: Not on file  Other Topics Concern  . Not on file  Social History Narrative  . Not on file    ECOG Status: 2 - Symptomatic, <50% confined to bed  Review of Systems: A 12 point ROS discussed and pertinent positives are indicated in the HPI above.  All other systems are negative.  Review of Systems  Constitutional: Negative for fever.  Respiratory: Negative for cough and shortness of breath.   Cardiovascular: Negative for chest pain.  Gastrointestinal:       Patient reports fecal incontinence  Genitourinary: Positive for difficulty urinating.    Vital Signs: BP (!) 177/94   Pulse 85   Temp 98.1 F (36.7 C)   Resp 16   Wt 62.6 kg   SpO2 100%   BMI 19.80 kg/m   Physical Exam   Imaging: Dg Chest 2 View  Result Date: 07/07/2019 CLINICAL DATA:  83 year old male with  chest and epigastric pain. EXAM: CHEST - 2 VIEW COMPARISON:  02/20/2016 and earlier. FINDINGS: Stable large lung volumes. Mediastinal contours are stable and within normal limits. Small calcified hilar lymph nodes redemonstrated on the left. Sequelae of TAVR redemonstrated. No pneumothorax, pulmonary edema, pleural effusion or acute pulmonary opacity. No acute osseous abnormality identified. Mild Calcified aortic atherosclerosis. Negative visible bowel gas pattern. IMPRESSION: 1.  No acute cardiopulmonary abnormality. 2.  Aortic Atherosclerosis (ICD10-I70.0). Electronically Signed   By: Genevie Ann M.D.   On: 07/07/2019 09:43    Labs:  CBC: Recent Labs    03/11/19 1301 05/13/19 1300 07/14/19 0953 07/20/19 1516  WBC 4.1 4.7 3.0* 5.2  HGB 11.9* 12.1* 11.8* 13.2  HCT 35.5* 36.6* 33.8* 39.7  PLT 38* 72* 47* 49*    COAGS: Recent Labs    07/20/19 1516  INR 1.2    BMP: Recent Labs    12/24/18 0428 05/13/19 1300 07/14/19 0953 07/20/19 1516  NA 134* 138 142 141  K 4.3 4.0 4.3 4.1  CL 103 103 103 108  CO2 22 27 26 23   GLUCOSE 133* 114* 87 100*  BUN 12 18 19 19   CALCIUM 8.0* 8.1* 8.9 8.4*  CREATININE 0.66 0.95 0.94 0.81  GFRNONAA >60 >60 76 >60  GFRAA >60 >60 88 >60    LIVER FUNCTION TESTS: Recent Labs    05/13/19 1300 07/14/19 0953  BILITOT 0.5 0.5  AST 14* 18  ALT 9 8  ALKPHOS 70 63  PROT 6.7 6.2  ALBUMIN 3.7 4.1    TUMOR MARKERS: No results for input(s): AFPTM, CEA, CA199, CHROMGRNA in the last 8760 hours.  Assessment and Plan:  Corey Oneill is a 83 y.o. male with past medical history significant for hyperlipidemia atherosclerosis and prostate cancer who presented to the emergency department with urinary retention post failed attempted cystoscopic placement of a Foley catheter.  As such, request made for placement of an image guided suprapubic catheter for urinary diversion  purposes.  Risks and benefits of image guided suprapubic catheter placement was discussed with the patient including bleeding, infection, damage to adjacent structures, bowel perforation/fistula connection, and sepsis.  All of the patient's questions were answered, patient is agreeable to proceed.  Consent signed and in chart.  Thank you for this interesting consult.  I greatly enjoyed meeting Corey Oneill and look forward to participating in their care.  A copy of this report was sent to the requesting provider on this date.  Electronically Signed: Sandi Mariscal, MD 07/20/2019, 7:27 PM   I spent a total of 15 Minutes in face to face in clinical consultation, greater than 50% of which was counseling/coordinating care for image guided placement of a suprapubic catheter

## 2019-07-20 NOTE — Procedures (Signed)
Pre procedural Dx: Urinary retention Post procedural Dx: Same  Technically successful CT guided placed of a 12 Fr drainage catheter placement into the urinary bladder yielding 600 cc of clear urine.    EBL: None Complications: None immediate  Ronny Bacon, MD Pager #: [phone]

## 2019-07-20 NOTE — ED Provider Notes (Addendum)
[facility] DEPT Provider Note   CSN: [id number] Arrival date & time: 07/20/19  1301     History   Chief Complaint Chief Complaint  Patient presents with  . Dysuria    HPI Corey Oneill is a 83 y.o. male.     HPI Pt started having pain in his penis the last couple of days.  He has been having urinary frequency and going just small amounts.  No blood in his urine.   No abd pain or back pain.   He denies any constipation. He has noticed some stool incontinence when standing.   No leg weakness or numbness.  Pt has metastatic prostrate CA with bone mets. Today the pain is extremely severe so he came to the ED  Past Medical History:  Diagnosis Date  . Abdominal aortic atherosclerosis ([place])   . Anginal pain ([place])   . BPPV (benign paroxysmal positional vertigo)   . CAD (coronary artery disease)    a.  LHC 8/16: Mid to distal LAD 30%, OM1 40%, proximal mid RCA 40%, distal RCA 60% >> FFR 0.69  >> PCI: 3 x 15 mm Resolute DES  . Depression   . Esophageal reflux   . Family history of adverse reaction to anesthesia    "daughter has PONV"  . Grade I diastolic dysfunction [id number]   Noted on ECHO   . H/O blood clots    "get them in my stool and urine" (11/12/2015)  . Heart murmur   . Hepatic lesion 12/08/2015   Stable 8 mm right hepatic lesion  . History of aortic stenosis    a. peak to peak gradient by LHC 8/16:  37 mmHg (moderately severe)   . History of appendicitis 2016  . History of herpes labialis   . History of ITP    2018, thrombocytopenia  . History of kidney stones   . Hx of radiation therapy 04/14/13-06/09/13   prostate 7800 cGy, 40 sessions, seminal vesicles 5600 cGy 40 sessions  . Hydrocele 2006   Small  . Hyperlipidemia   . Insomnia    resolved  . 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 Metastasis from malignant neoplasm of prostate ([place]) 02/25/2019  . Prostate cancer ([place]) dx'd 2014  . Radiation  proctitis   . Renal mass 2017   Bilateral renal masses  . S/P skin biopsy 04/09/2019   subepidermal cell poor vesicle , Linear IGG and C3 the basement membrane  . Wears hearing aid in both ears     Patient Active Problem List   Diagnosis Date Noted  . Bullous pemphigoid 06/12/2019  . Metastasis from malignant neoplasm of prostate ([place]) 02/25/2019  . Radiation proctitis   . Hx of radiation therapy   . Heart murmur   . H/O blood clots   . Family history of adverse reaction to anesthesia   . CAD (coronary artery disease)   . Aortic stenosis   . Anginal pain ([place])   . History of ITP 02/26/2017  . 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 S/P appendectomy 11/12/2015  . Esophageal reflux 03/22/2015  . Depression 11/20/2014  . Prostate cancer ([place]) 03/05/2013  . Erectile dysfunction   . Hyperlipidemia 06/07/2007    Past Surgical History:  Procedure Laterality Date  . CARDIAC CATHETERIZATION N/A 04/21/2015   Procedure: Right/Left Heart Cath and Coronary  Angiography;  Surgeon: [name], MD;  Location: [facility];  Service: Cardiovascular;  Laterality: N/A;  . CARDIAC CATHETERIZATION N/A 06/18/2015   Procedure: Intravascular Pressure Wire/FFR Study;  Surgeon: [name], MD;  Location: [facility];  Service: Cardiovascular;  Laterality: N/A;  . CARDIAC CATHETERIZATION N/A 06/18/2015   Procedure: Coronary Stent Intervention;  Surgeon: [name], MD;  Location: [facility];  Service: Cardiovascular;  Laterality: N/A;  . CARDIAC CATHETERIZATION N/A 06/18/2015   Procedure: Right/Left Heart Cath and Coronary Angiography;  Surgeon: [name], MD;  Location: [facility];  Service: Cardiovascular;  Laterality: N/A;  . CARDIAC CATHETERIZATION N/A 06/23/2015   Procedure: Left Heart Cath and Cors/Grafts Angiography;  Surgeon: [name], MD;   Location: [facility];  Service: Cardiovascular;  Laterality: N/A;  . CARDIAC CATHETERIZATION N/A 01/17/2016   Procedure: Right/Left Heart Cath and Coronary Angiography;  Surgeon: [name], MD;  Location: [facility];  Service: Cardiovascular;  Laterality: N/A;  . CATARACT EXTRACTION, BILATERAL    . COLONOSCOPY  06/26/2017  . CYSTOSCOPY WITH BIOPSY N/A 07/30/2018   Procedure: CYSTOSCOPY WITH BIOPSY/FULGURATION, CYSTOLTHOLAPAXY;  Surgeon: [name], MD;  Location: [facility];  Service: Urology;  Laterality: N/A;  . FRACTURE SURGERY    . INGUINAL HERNIA REPAIR     patient does not remember this procedure  . LAPAROSCOPIC APPENDECTOMY N/A 11/16/2015   Procedure: APPENDECTOMY LAPAROSCOPIC;  Surgeon: [name], MD;  Location: [facility];  Service: General;  Laterality: N/A;  . LEFT HEART CATH AND CORONARY ANGIOGRAPHY N/A 12/26/2016   Procedure: Left Heart Cath and Coronary Angiography;  Surgeon: Troy Sine, MD;  Location: [facility];  Service: Cardiovascular;  Laterality: N/A;  . MELANOMA EXCISION Right 10/24/2016   Procedure: WIDE EXCISION MELANOMA RIGHT NECK AND RIGHT CHEST TIMES 2;  Surgeon: [name], MD;  Location: [facility];  Service: General;  Laterality: Right;  right medial and lateral lesion of chest and right neck  . NASAL FRACTURE SURGERY     "broken years ago; several ORs to correct it"  . NASAL SEPTUM SURGERY    . PROSTATE BIOPSY  2014   "needle biopsy"  . TEE WITHOUT CARDIOVERSION N/A 02/15/2016   Procedure: TRANSESOPHAGEAL ECHOCARDIOGRAM (TEE);  Surgeon: [name], MD;  Location[facility];  Service: Open Heart Surgery;  Laterality: N/A;  . TONSILLECTOMY    . TRANSCATHETER AORTIC VALVE REPLACEMENT, TRANSFEMORAL  02/15/2016  . TRANSCATHETER AORTIC VALVE REPLACEMENT, TRANSFEMORAL N/A 02/15/2016   Procedure: TRANSCATHETER AORTIC VALVE REPLACEMENT, TRANSFEMORAL;  Surgeon: [name], MD;  Location: [facility];   Service: Open Heart Surgery;  Laterality: N/A;  . TRANSURETHRAL RESECTION OF PROSTATE  12/23/2018   Procedure: CYSTOSCOPY WITH  BLADDER BIOPSY WITH FULGERATION/ TRANSURETHRAL RESECTION PROSTATE  AND PROSTATE BIOPSY;  Surgeon: [name], MD;  Location: [facility];  Service: Urology;;        Home Medications    Prior to Admission medications   Medication Sig Start Date End Date Taking? Authorizing Provider  acetaminophen (TYLENOL) 500 MG tablet Take 500 mg by mouth every 6 (six) hours as needed for moderate pain.    Yes [provider]  aspirin EC 81 MG tablet Take 81 mg by mouth at bedtime.    Yes [provider]  carvedilol (COREG) 3.125 MG tablet TAKE 1 TABLET BY MOUTH TWICE DAILY. Patient taking differently: Take 3.125 mg by mouth 2 (two) times  daily.  06/26/19  Yes [name], MD  cyanocobalamin 2000 MCG tablet Take 2,000 mcg by mouth daily.   Yes [provider]  DULoxetine (CYMBALTA) 30 MG capsule Take 30 mg by mouth daily.   Yes [provider]  Enzalutamide (XTANDI PO) Take 4 tablets by mouth daily.    Yes [provider]  enzalutamide (XTANDI) 40 MG capsule Take 160 mg by mouth daily.   Yes [provider]  ferrous sulfate 325 (65 FE) MG EC tablet Take 1 tablet (325 mg total) by mouth daily with breakfast. If no side effects, change to twice daily after 1 week. 12/24/18  Yes Bonnielee Haff, MD  isosorbide mononitrate (IMDUR) 30 MG 24 hr tablet Take 1 tablet (30 mg total) by mouth daily. 10/22/18 10/22/19 Yes Weaver, Scott T, PA-C  MULTIPLE VITAMIN PO Take 1 tablet by mouth daily.   Yes [provider]  rosuvastatin (CRESTOR) 10 MG tablet TAKE 1 TABLET ONCE DAILY. Patient taking differently: Take 10 mg by mouth daily.  05/26/19  Yes Tysinger, Camelia Eng, PA-C  tamsulosin (FLOMAX) 0.4 MG CAPS capsule Take 0.4 mg by mouth daily. 07/07/19  Yes [provider]  amoxicillin (AMOXIL) 500 MG tablet Take 4 tablets  (2,000 mg) one hour prior to ALL dental visits. 01/15/19   [name], MD  nitroGLYCERIN (NITROSTAT) 0.4 MG SL tablet 1 TAB UNDER TONGUE AS NEEDED FOR CHEST PAIN. MAY REPEAT EVERY 5 MIN FOR A TOTAL OF 3 DOSES. Patient taking differently: Place 0.4 mg under the tongue every 5 (five) minutes as needed for chest pain.  07/07/19   [name], MD  vortioxetine HBr (TRINTELLIX) 5 MG TABS tablet Take 1 tablet (5 mg total) by mouth daily. Patient not taking: Reported on 07/20/2019 03/05/19   [name], MD    Family History Family History  Problem Relation Age of Onset  . Brain cancer Father   . Lymphoma Mother   . Depression Daughter     Social History Social History   Tobacco Use  . Smoking status: Former Smoker    Packs/day: 0.00    Types: Cigarettes  . Smokeless tobacco: Never Used  . Tobacco comment: "quit smoking cigarettes in the 1960s; quit smoking cigars in the 1990s  Substance Use Topics  . Alcohol use: Yes    Comment: 2 DRINKS PER DAY  . Drug use: No     Allergies   Patient has no known allergies.   Review of Systems Review of Systems  All other systems reviewed and are negative.    Physical Exam Updated Vital Signs BP (!) 148/85   Pulse 86   Temp 98.1 F (36.7 C)   Resp 13   Wt 62.6 kg   SpO2 97%   BMI 19.80 kg/m   Physical Exam Vitals signs and nursing note reviewed.  Constitutional:      Appearance: He is well-developed. He is not toxic-appearing or diaphoretic.  HENT:     Head: Normocephalic and atraumatic.     Right Ear: External ear normal.     Left Ear: External ear normal.  Eyes:     General: No scleral icterus.       Right eye: No discharge.        Left eye: No discharge.     Conjunctiva/sclera: Conjunctivae normal.  Neck:     Musculoskeletal: Neck supple.     Trachea: No tracheal deviation.  Cardiovascular:     Rate and Rhythm: Normal rate and  regular rhythm.  Pulmonary:     Effort: Pulmonary effort is normal. No  respiratory distress.     Breath sounds: Normal breath sounds. No stridor. No wheezing or rales.  Abdominal:     General: Bowel sounds are normal. There is no distension.     Palpations: Abdomen is soft.     Tenderness: There is no abdominal tenderness. There is no guarding or rebound.  Genitourinary:    Comments: Minor skin irritation noted at the base of the glans, no tenderness, no erythema no mass; no fecal impaction, dark mucoid stool on rectal exam, enlarged prostate Musculoskeletal:        General: No tenderness.  Skin:    General: Skin is warm and dry.     Findings: Rash present.     Comments: Few bullous pemphigoid type lesions on the hands  Neurological:     Mental Status: He is alert.     Cranial Nerves: No cranial nerve deficit (no facial droop, extraocular movements intact, no slurred speech).     Sensory: No sensory deficit.     Motor: No abnormal muscle tone or seizure activity.     Coordination: Coordination normal.      ED Treatments / Results  Labs (all labs ordered are listed, but only abnormal results are displayed) Labs Reviewed  CBC - Abnormal; Notable for the following components:      Result Value   RBC 3.94 (*)    MCV 100.8 (*)    Platelets 49 (*)    All other components within normal limits  BASIC METABOLIC PANEL - Abnormal; Notable for the following components:   Glucose, Bld 100 (*)    Calcium 8.4 (*)    All other components within normal limits  POC OCCULT BLOOD, ED - Abnormal; Notable for the following components:   Fecal Occult Bld POSITIVE (*)    All other components within normal limits  SARS CORONAVIRUS 2 (HOSPITAL ORDER, [facility])  PROTIME-INR  URINALYSIS, ROUTINE W REFLEX MICROSCOPIC    EKG None  Radiology No results found.  Procedures Procedures (including critical care time)  Medications Ordered in ED Medications  midazolam (VERSED) 2 MG/2ML injection (has no administration in time range)   fentaNYL (SUBLIMAZE) 100 MCG/2ML injection (has no administration in time range)  morphine 4 MG/ML injection 4 mg (4 mg Intravenous Given 07/20/19 1526)  morphine 4 MG/ML injection 4 mg (4 mg Intravenous Given 07/20/19 1654)  ciprofloxacin (CIPRO) IVPB 400 mg (400 mg Intravenous New Bag/Given 07/20/19 1813)     Initial Impression / Assessment and Plan / ED Course  I have reviewed the triage vital signs and the nursing notes.  Pertinent labs & imaging results that were available during my care of the patient were reviewed by me and considered in my medical decision making (see chart for details).  Clinical Course as of Jul 20 2107  Nancy Fetter Jul 20, 2019  1517 RN staff unable to place foley using standard or coude catheter   [JK]  1547 Bladder scan was approximately 600 cc.  I will consult with urology.   [JK]  1626 I discussed case with Dr. Jeffie Pollock.  He will come and evaluate the patient in the ED.   [JK]  1733 Dr Jeffie Pollock evaluated pt.  Unable to place catheter.  Pt will need IR suprapubic catheter.   [JK]  1747 Stool positive for blood but normal hgb.  INR normal.  Thrombocytopenia chronic   [JK]  Clinical Course User Index [JK] Dorie Rank, MD     Patient presented to the emergency room for evaluation of urinary retention.  Unsuccessful attempts by RN staff in the ED.  Dr. Jeffie Pollock urology was consulted and was unable to place a Foley catheter after a cystoscopy.  Patient had IR placement of a suprapubic catheter by Dr. Pascal Lux.  Patient was discharged home with outpatient urology follow-up.  Patient was noted to have guaiac positive stools but no gross blood noted in his hemoglobin is stable.  Patient can follow-up safely as an outpatient.  Warning signs precautions discussed.  Final Clinical Impressions(s) / ED Diagnoses   Final diagnoses:  Urinary retention  Guaiac positive stools    ED Discharge Orders    None       Dorie Rank, MD 07/20/19 2002 Patient returned from interventional  radiology.  He is awake and alert.   Dorie Rank, MD 07/20/19 2109

## 2019-07-20 NOTE — Consult Note (Addendum)
Subjective: CC: penile pain.  Hx: Corey Oneill is an 83 yo male with a history of metastatic prostate cancer with prior prostate radiation who I was asked to see in consultation by Dr. Tomi Bamberger for retention and difficult foley placement.  Corey Oneill had cystoscopy and bladder biospy in 2/20 and I was asked to see him in consultation today for urinary retention.  Corey Oneill has had progressive voiding difficulty and was recently seen in the office but had only a 145ml PVR but his symptoms have worsened and Corey Oneill has had penile pain with a very weak stream despite tamsulosin.  In the ED his PVR is 677ml and attempts and straight and coude catheter placement were unsuccessful.  Corey Oneill has no other associated signs or symptoms.    ROS:  Review of Systems  All other systems reviewed and are negative.   No Known Allergies  Past Medical History:  Diagnosis Date  . Abdominal aortic atherosclerosis ([place])   . Anginal pain ([place])   . BPPV (benign paroxysmal positional vertigo)   . CAD (coronary artery disease)    a.  LHC 8/16: Mid to distal LAD 30%, OM1 40%, proximal mid RCA 40%, distal RCA 60% >> FFR 0.69  >> PCI: 3 x 15 mm Resolute DES  . Depression   . Esophageal reflux   . Family history of adverse reaction to anesthesia    "daughter has PONV"  . Grade I diastolic dysfunction [id number]   Noted on ECHO   . H/O blood clots    "get them in my stool and urine" (11/12/2015)  . Heart murmur   . Hepatic lesion 12/08/2015   Stable 8 mm right hepatic lesion  . History of aortic stenosis    a. peak to peak gradient by LHC 8/16:  37 mmHg (moderately severe)   . History of appendicitis 2016  . History of herpes labialis   . History of ITP    2018, thrombocytopenia  . History of kidney stones   . Hx of radiation therapy 04/14/13-06/09/13   prostate 7800 cGy, 40 sessions, seminal vesicles 5600 cGy 40 sessions  . Hydrocele 2006   Small  . Hyperlipidemia   . Insomnia    resolved  . Malignant melanoma in situ ([place])  09/04/2016   Right neck and chest  . Metastasis from malignant neoplasm of prostate ([place]) 02/25/2019  . Prostate cancer ([place]) dx'd 2014  . Radiation proctitis   . Renal mass 2017   Bilateral renal masses  . S/P skin biopsy 04/09/2019   subepidermal cell poor vesicle , Linear IGG and C3 the basement membrane  . Wears hearing aid in both ears     Past Surgical History:  Procedure Laterality Date  . CARDIAC CATHETERIZATION N/A 04/21/2015   Procedure: Right/Left Heart Cath and Coronary Angiography;  Surgeon: [name], MD;  Location: [facility];  Service: Cardiovascular;  Laterality: N/A;  . CARDIAC CATHETERIZATION N/A 06/18/2015   Procedure: Intravascular Pressure Wire/FFR Study;  Surgeon: [name], MD;  Location: [facility];  Service: Cardiovascular;  Laterality: N/A;  . CARDIAC CATHETERIZATION N/A 06/18/2015   Procedure: Coronary Stent Intervention;  Surgeon: [name], MD;  Location: [facility];  Service: Cardiovascular;  Laterality: N/A;  . CARDIAC CATHETERIZATION N/A 06/18/2015   Procedure: Right/Left Heart Cath and Coronary Angiography;  Surgeon: [name], MD;  Location: [facility];  Service: Cardiovascular;  Laterality: N/A;  . CARDIAC CATHETERIZATION N/A 06/23/2015   Procedure: Left  Heart Cath and Cors/Grafts Angiography;  Surgeon: [name], MD;  Location: [facility];  Service: Cardiovascular;  Laterality: N/A;  . CARDIAC CATHETERIZATION N/A 01/17/2016   Procedure: Right/Left Heart Cath and Coronary Angiography;  Surgeon: [name], MD;  Location: [facility];  Service: Cardiovascular;  Laterality: N/A;  . CATARACT EXTRACTION, BILATERAL    . COLONOSCOPY  06/26/2017  . CYSTOSCOPY WITH BIOPSY N/A 07/30/2018   Procedure: CYSTOSCOPY WITH BIOPSY/FULGURATION, CYSTOLTHOLAPAXY;  Surgeon: [name], MD;  Location: [facility];  Service: Urology;  Laterality: N/A;  . FRACTURE SURGERY    . INGUINAL HERNIA  REPAIR     patient does not remember this procedure  . LAPAROSCOPIC APPENDECTOMY N/A 11/16/2015   Procedure: APPENDECTOMY LAPAROSCOPIC;  Surgeon: [name], MD;  Location: [facility];  Service: General;  Laterality: N/A;  . LEFT HEART CATH AND CORONARY ANGIOGRAPHY N/A 12/26/2016   Procedure: Left Heart Cath and Coronary Angiography;  Surgeon: [place] Sine, MD;  Location: [facility];  Service: Cardiovascular;  Laterality: N/A;  . MELANOMA EXCISION Right 10/24/2016   Procedure: WIDE EXCISION MELANOMA RIGHT NECK AND RIGHT CHEST TIMES 2;  Surgeon: [name], MD;  Location: [facility];  Service: General;  Laterality: Right;  right medial and lateral lesion of chest and right neck  . NASAL FRACTURE SURGERY     "broken years ago; several ORs to correct it"  . NASAL SEPTUM SURGERY    . PROSTATE BIOPSY  2014   "needle biopsy"  . TEE WITHOUT CARDIOVERSION N/A 02/15/2016   Procedure: TRANSESOPHAGEAL ECHOCARDIOGRAM (TEE);  Surgeon: [name], MD;  Location: [facility];  Service: Open Heart Surgery;  Laterality: N/A;  . TONSILLECTOMY    . TRANSCATHETER AORTIC VALVE REPLACEMENT, TRANSFEMORAL  02/15/2016  . TRANSCATHETER AORTIC VALVE REPLACEMENT, TRANSFEMORAL N/A 02/15/2016   Procedure: TRANSCATHETER AORTIC VALVE REPLACEMENT, TRANSFEMORAL;  Surgeon: [name], MD;  Location: [facility];  Service: Open Heart Surgery;  Laterality: N/A;  . TRANSURETHRAL RESECTION OF PROSTATE  12/23/2018   Procedure: CYSTOSCOPY WITH  BLADDER BIOPSY WITH FULGERATION/ TRANSURETHRAL RESECTION PROSTATE  AND PROSTATE BIOPSY;  Surgeon: [name], MD;  Location: [facility];  Service: Urology;;    Social History   Socioeconomic History  . Marital status: Married    Spouse name: Not on file  . Number of children: 2  . Years of education: Not on file  . Highest education level: Not on file  Occupational History  . Not on file  Social Needs  . Financial resource strain: Not on file  . Food insecurity     Worry: Not on file    Inability: Not on file  . Transportation needs    Medical: Not on file    Non-medical: Not on file  Tobacco Use  . Smoking status: Former Smoker    Packs/day: 0.00    Types: Cigarettes  . Smokeless tobacco: Never Used  . Tobacco comment: "quit smoking cigarettes in the 1960s; quit smoking cigars in the 1990s  Substance and Sexual Activity  . Alcohol use: Yes    Comment: 2 DRINKS PER DAY  . Drug use: No  . Sexual activity: Not on file  Lifestyle  . Physical activity    Days per week: Not on file    Minutes per session: Not on file  . Stress: Not on file  Relationships  . Social Herbalist on phone: Not on file    Gets together:  Not on file    Attends religious service: Not on file    Active member of club or organization: Not on file    Attends meetings of clubs or organizations: Not on file    Relationship status: Not on file  . Intimate partner violence    Fear of current or ex partner: Not on file    Emotionally abused: Not on file    Physically abused: Not on file    Forced sexual activity: Not on file  Other Topics Concern  . Not on file  Social History Narrative  . Not on file    Family History  Problem Relation Age of Onset  . Brain cancer Father   . Lymphoma Mother   . Depression Daughter     Anti-infectives: Anti-infectives (From admission, onward)   None      No current facility-administered medications for this encounter.    Current Outpatient Medications  Medication Sig Dispense Refill  . acetaminophen (TYLENOL) 500 MG tablet Take 500 mg by mouth every 6 (six) hours as needed for moderate pain.     [name] aspirin EC 81 MG tablet Take 81 mg by mouth at bedtime.     . carvedilol (COREG) 3.125 MG tablet TAKE 1 TABLET BY MOUTH TWICE DAILY. (Patient taking differently: Take 3.125 mg by mouth 2 (two) times daily. ) 60 tablet 2  . cyanocobalamin 2000 MCG tablet Take 2,000 mcg by mouth daily.    . DULoxetine (CYMBALTA) 30  MG capsule Take 30 mg by mouth daily.    . Enzalutamide (XTANDI PO) Take 4 tablets by mouth daily.     . enzalutamide (XTANDI) 40 MG capsule Take 160 mg by mouth daily.    . ferrous sulfate 325 (65 FE) MG EC tablet Take 1 tablet (325 mg total) by mouth daily with breakfast. If no side effects, change to twice daily after 1 week. 30 tablet 2  . isosorbide mononitrate (IMDUR) 30 MG 24 hr tablet Take 1 tablet (30 mg total) by mouth daily. 30 tablet 11  . MULTIPLE VITAMIN PO Take 1 tablet by mouth daily.    . rosuvastatin (CRESTOR) 10 MG tablet TAKE 1 TABLET ONCE DAILY. (Patient taking differently: Take 10 mg by mouth daily. ) 90 tablet 0  . tamsulosin (FLOMAX) 0.4 MG CAPS capsule Take 0.4 mg by mouth daily.    [name] amoxicillin (AMOXIL) 500 MG tablet Take 4 tablets (2,000 mg) one hour prior to ALL dental visits. 8 tablet 6  . nitroGLYCERIN (NITROSTAT) 0.4 MG SL tablet 1 TAB UNDER TONGUE AS NEEDED FOR CHEST PAIN. MAY REPEAT EVERY 5 MIN FOR A TOTAL OF 3 DOSES. (Patient taking differently: Place 0.4 mg under the tongue every 5 (five) minutes as needed for chest pain. ) 25 tablet 2  . vortioxetine HBr (TRINTELLIX) 5 MG TABS tablet Take 1 tablet (5 mg total) by mouth daily. (Patient not taking: Reported on 07/20/2019) 30 tablet 1     Objective: Vital signs in last 24 hours: Temp:  [98.1 F (36.7 C)] 98.1 F (36.7 C) (09/06 1318) Pulse Rate:  [75-90] 88 (09/06 1651) Resp:  [16-18] 18 (09/06 1651) BP: (137-189)/(77-100) 175/92 (09/06 1651) SpO2:  [96 %-100 %] 100 % (09/06 1630) Weight:  [62.6 kg] 62.6 kg (09/06 1317)  Intake/Output from previous day: No intake/output data recorded. Intake/Output this shift: No intake/output data recorded.   Physical Exam Vitals signs reviewed.  Constitutional:      Appearance: Normal appearance.  HENT:  Head: Normocephalic and atraumatic.  Neck:     Musculoskeletal: Normal range of motion and neck supple.  Cardiovascular:     Rate and Rhythm: Normal rate  and regular rhythm.  Pulmonary:     Effort: Pulmonary effort is normal. No respiratory distress.  Abdominal:     Palpations: Abdomen is soft. There is mass (suprapubic consistent with distended bladder. ).     Tenderness: There is abdominal tenderness.  Genitourinary:    Comments: Nl phallus with adequate meatus.  Scrotum and contents normal.  Musculoskeletal: Normal range of motion.        General: No swelling or tenderness.  Skin:    General: Skin is warm and dry.     Comments: Radiation tattoo on SP area  Neurological:     General: No focal deficit present.     Mental Status: Corey Oneill is alert and oriented to person, place, and time.  Psychiatric:        Mood and Affect: Mood normal.        Behavior: Behavior normal.     Lab Results:  Recent Labs    07/20/19 1516  WBC 5.2  HGB 13.2  HCT 39.7  PLT 49*   BMET Recent Labs    07/20/19 1516  NA 141  K 4.1  CL 108  CO2 23  GLUCOSE 100*  BUN 19  CREATININE 0.81  CALCIUM 8.4*   PT/INR Recent Labs    07/20/19 1516  LABPROT 14.9  INR 1.2   ABG No results for input(s): PHART, HCO3 in the last 72 hours.  Invalid input(s): PCO2, PO2  Studies/Results: No results found.  I have discussed his case with the EDP and IR.  I have reviewed his prior CT scan and pertinent notes and labs.    Assessment: Urinary retention with a severe bulbar stricture.   Cystoscopic attempt to place foley was not successful.  Corey Oneill needs image guided placement of a suprapubic catheter today.  IR consulted.   Metastatic prostate cancer with prior pelvic radiation therapy.    CC: Dr. Marye Round and Dr. Eda Keys.      Irine Seal 07/20/2019 [phone]

## 2019-07-20 NOTE — Op Note (Signed)
Procedure: 1.  Attempted Foley catheter placement. 2.  Cystoscopy.  Preop diagnosis: Urinary retention with difficult catheter placement.  Postop diagnosis: Severe bulbar urethral stricture.  Surgeon: Dr. Irine Seal.  Anesthesia: None.  Specimen: None.  Drains: None.  EBL: None.  Complications: None.  Indications: The patient is an 83 year old male with a history of metastatic prostate cancer with prior radiation therapy is now on urinary retention.  Attempts at Foley catheter placement by the ER staff were unsuccessful.  Procedure: He was supine on the ER stretcher.  His genitalia was prepped with Betadine solution and he was draped with sterile towels.  An initial attempt to pass an 54 [place] coud catheter was unsuccessful; however it did not seem traumatic as there was minimal bleeding.  Single gentle attempt to pass a [place] sound was unsuccessful.  The flexible cystoscope was then assembled and inserted and he was noted to have a normal anterior urethra.  The bulbar urethra had a severe obliterative stricture with no visible lumen.  The procedure was aborted.  The prostate and bladder were not visualized.  There were no complications.  IR will be consulted to place a suprapubic tube.

## 2019-07-20 NOTE — ED Triage Notes (Signed)
Pt states he has been dribbling with urine for 2-3 days. Pt states his penis is hurting. Pt states he has also been having uncontrolled BMs.

## 2019-07-20 NOTE — ED Notes (Signed)
Bladder scan showed 517 mL

## 2019-07-24 ENCOUNTER — Telehealth: Payer: Self-pay | Admitting: Family Medicine

## 2019-07-24 DIAGNOSIS — E559 Vitamin D deficiency, unspecified: Secondary | ICD-10-CM | POA: Diagnosis not present

## 2019-07-24 DIAGNOSIS — C61 Malignant neoplasm of prostate: Secondary | ICD-10-CM | POA: Diagnosis not present

## 2019-07-24 NOTE — Telephone Encounter (Signed)
LVM for pt to call back to the office . KH 

## 2019-07-24 NOTE — Telephone Encounter (Signed)
Let him know that I did look at it and did not see anything unusual

## 2019-07-24 NOTE — Telephone Encounter (Signed)
Pt called and wants you to look at his ER notes  From 07/20/2019 and just make sure u was aware of what was going on, pt states if you seen anything unusual in the notes you can give him a call  Pt can be reached at [phone]

## 2019-07-25 ENCOUNTER — Ambulatory Visit: Payer: Medicare Other | Admitting: Cardiology

## 2019-08-04 DIAGNOSIS — C61 Malignant neoplasm of prostate: Secondary | ICD-10-CM | POA: Diagnosis not present

## 2019-08-04 DIAGNOSIS — N35011 Post-traumatic bulbous urethral stricture: Secondary | ICD-10-CM | POA: Diagnosis not present

## 2019-08-04 DIAGNOSIS — C7951 Secondary malignant neoplasm of bone: Secondary | ICD-10-CM | POA: Diagnosis not present

## 2019-08-05 DIAGNOSIS — D485 Neoplasm of uncertain behavior of skin: Secondary | ICD-10-CM | POA: Diagnosis not present

## 2019-08-05 DIAGNOSIS — L120 Bullous pemphigoid: Secondary | ICD-10-CM | POA: Diagnosis not present

## 2019-08-12 ENCOUNTER — Other Ambulatory Visit: Payer: Self-pay | Admitting: Urology

## 2019-08-12 NOTE — Progress Notes (Signed)
Need orders. Surgery on 10-5. Thanks

## 2019-08-14 ENCOUNTER — Inpatient Hospital Stay: Payer: Medicare Other

## 2019-08-14 ENCOUNTER — Encounter (HOSPITAL_COMMUNITY): Payer: Self-pay

## 2019-08-14 ENCOUNTER — Other Ambulatory Visit (HOSPITAL_COMMUNITY)
Admission: RE | Admit: 2019-08-14 | Discharge: 2019-08-14 | Disposition: A | Discharge status: Home / Self Care | Payer: Medicare Other | Source: Ambulatory Visit | Attending: Urology | Admitting: Urology

## 2019-08-14 ENCOUNTER — Inpatient Hospital Stay: Payer: Medicare Other | Attending: Oncology | Admitting: Oncology

## 2019-08-14 ENCOUNTER — Other Ambulatory Visit: Payer: Self-pay

## 2019-08-14 VITALS — BP 126/72 | HR 72 | Temp 98.0°F | Resp 17 | Ht 70.0 in | Wt 150.7 lb

## 2019-08-14 DIAGNOSIS — D696 Thrombocytopenia, unspecified: Secondary | ICD-10-CM | POA: Insufficient documentation

## 2019-08-14 DIAGNOSIS — L120 Bullous pemphigoid: Secondary | ICD-10-CM | POA: Diagnosis not present

## 2019-08-14 DIAGNOSIS — Z7982 Long term (current) use of aspirin: Secondary | ICD-10-CM | POA: Diagnosis not present

## 2019-08-14 DIAGNOSIS — I209 Angina pectoris, unspecified: Secondary | ICD-10-CM | POA: Diagnosis not present

## 2019-08-14 DIAGNOSIS — Z20828 Contact with and (suspected) exposure to other viral communicable diseases: Secondary | ICD-10-CM | POA: Diagnosis not present

## 2019-08-14 DIAGNOSIS — Z85820 Personal history of malignant melanoma of skin: Secondary | ICD-10-CM | POA: Diagnosis not present

## 2019-08-14 DIAGNOSIS — Z01812 Encounter for preprocedural laboratory examination: Secondary | ICD-10-CM | POA: Insufficient documentation

## 2019-08-14 DIAGNOSIS — Z79899 Other long term (current) drug therapy: Secondary | ICD-10-CM | POA: Insufficient documentation

## 2019-08-14 DIAGNOSIS — C61 Malignant neoplasm of prostate: Secondary | ICD-10-CM | POA: Diagnosis not present

## 2019-08-14 LAB — CMP (CANCER CENTER ONLY)
ALT: 8 U/L (ref 0–44)
AST: 17 U/L (ref 15–41)
Albumin: 3.8 g/dL (ref 3.5–5.0)
Alkaline Phosphatase: 71 U/L (ref 38–126)
Anion gap: 9 (ref 5–15)
BUN: 15 mg/dL (ref 8–23)
CO2: 28 mmol/L (ref 22–32)
Calcium: 8.8 mg/dL — ABNORMAL LOW (ref 8.9–10.3)
Chloride: 105 mmol/L (ref 98–111)
Creatinine: 0.8 mg/dL (ref 0.61–1.24)
GFR, Est AFR Am: >60 mL/min (ref >60)
GFR, Estimated: >60 mL/min (ref >60)
Glucose, Bld: 111 mg/dL — ABNORMAL HIGH (ref 70–99)
Potassium: 3.8 mmol/L (ref 3.5–5.1)
Sodium: 142 mmol/L (ref 135–145)
Total Bilirubin: 0.6 mg/dL (ref 0.3–1.2)
Total Protein: 7 g/dL (ref 6.5–8.1)

## 2019-08-14 LAB — CBC WITH DIFFERENTIAL (CANCER CENTER ONLY)
Abs Immature Granulocytes: 0.01 10*3/uL (ref 0.00–0.07)
Basophils Absolute: 0 10*3/uL (ref 0.0–0.1)
Basophils Relative: 0 %
Eosinophils Absolute: 0.1 10*3/uL (ref 0.0–0.5)
Eosinophils Relative: 3 %
HCT: 35 % — ABNORMAL LOW (ref 39.0–52.0)
Hemoglobin: 11.7 g/dL — ABNORMAL LOW (ref 13.0–17.0)
Immature Granulocytes: 0 %
Lymphocytes Relative: 27 %
Lymphs Abs: 0.8 10*3/uL (ref 0.7–4.0)
MCH: 33.5 pg (ref 26.0–34.0)
MCHC: 33.4 g/dL (ref 30.0–36.0)
MCV: 100.3 fL — ABNORMAL HIGH (ref 80.0–100.0)
Monocytes Absolute: 0.2 10*3/uL (ref 0.1–1.0)
Monocytes Relative: 6 %
Neutro Abs: 2 10*3/uL (ref 1.7–7.7)
Neutrophils Relative %: 64 %
Platelet Count: 73 10*3/uL — ABNORMAL LOW (ref 150–400)
RBC: 3.49 MIL/uL — ABNORMAL LOW (ref 4.22–5.81)
RDW: 12.2 % (ref 11.5–15.5)
WBC Count: 3.1 10*3/uL — ABNORMAL LOW (ref 4.0–10.5)
nRBC: 0 % (ref 0.0–0.2)

## 2019-08-14 NOTE — Progress Notes (Signed)
Hematology and Oncology Follow Up Visit  Corey Oneill [id number] [birth year] 83 y.o. 08/14/2019 9:50 AM [name], MDLalonde, [name], MD   Principle Diagnosis: 83 year old man with:  1.  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  He had early stage disease without any evidence of relapse.  He was found to have 0.39 mm Clark's level was IV  at that time.   2.  Thrombocytopenia diagnosed in 2018.  Differential diagnosis including ITP versus early myelodysplasia.  He had a trial of prednisone in the past with improvement in his platelet count indicating likely ITP.  3.  Advanced prostate cancer with involvement in the bone.  He has castration-resistant disease.  4.  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   Prior Therapy:   He is status post wide excision of his melanoma in December 2017.  Prednisone therapy currently at 60 mg daily started in February 2018 and was tapered to off in May 2018.  Current therapy:   Lupron every 6 months under the care of Dr. Junious Silk.  Xtandi 160 mg daily started by Dr. Junious Silk April 2020.  Interim History:  Mr. Tivnan returns today for a follow-up.  Since the last visit, he was evaluated by dermatology for his bullous pemphigoid was diagnosed in May 2020.  He was treated with a topical treatments and started on methotrexate in the last few days.  He denies any complications related to it at this time.  He also underwent a suprapubic catheter insertion in September 2020 after presenting with urinary retention and unable to have a catheter placed.  Clinically, he denies any excessive fatigue, tiredness or weakness.  He denies any bleeding complications.  He denies any complications related to methotrexate.  Patient denied any alteration mental status, neuropathy, confusion or dizziness.  Denies any headaches or lethargy.  Denies any night sweats, weight loss or changes in appetite.  Denied orthopnea, dyspnea on  exertion or chest discomfort.  Denies shortness of breath, difficulty breathing hemoptysis or cough.  Denies any abdominal distention, nausea, early satiety or dyspepsia.  Denies any hematuria, frequency, dysuria or nocturia.  Denies any skin irritation, dryness or rash.  Denies any ecchymosis or petechiae.  Denies any lymphadenopathy or clotting.  Denies any heat or cold intolerance.  Denies any anxiety or depression.  Remaining review of system is negative.            Medications: Updated without any changes. Current Outpatient Medications  Medication Sig Dispense Refill  . acetaminophen (TYLENOL) 500 MG tablet Take 500 mg by mouth every 6 (six) hours as needed for moderate pain.     [name] amoxicillin (AMOXIL) 500 MG tablet Take 4 tablets (2,000 mg) one hour prior to ALL dental visits. 8 tablet 6  . aspirin EC 81 MG tablet Take 81 mg by mouth at bedtime.     . carvedilol (COREG) 3.125 MG tablet TAKE 1 TABLET BY MOUTH TWICE DAILY. (Patient taking differently: Take 3.125 mg by mouth 2 (two) times daily. ) 60 tablet 2  . cyanocobalamin 2000 MCG tablet Take 2,000 mcg by mouth daily.    . DULoxetine (CYMBALTA) 30 MG capsule Take 30 mg by mouth daily.    . enzalutamide (XTANDI) 40 MG capsule Take 160 mg by mouth daily.    . ferrous sulfate 325 (65 FE) MG EC tablet Take 1 tablet (325 mg total) by mouth daily with breakfast. If no side effects, change to twice daily after 1 week.  30 tablet 2  . folic acid (FOLVITE) 1 MG tablet Take 1 mg by mouth See admin instructions. Everyday but Tues    . isosorbide mononitrate (IMDUR) 30 MG 24 hr tablet Take 1 tablet (30 mg total) by mouth daily. 30 tablet 11  . methotrexate (RHEUMATREX) 2.5 MG tablet Take 10 mg by mouth once a week. Tuesday    . MULTIPLE VITAMIN PO Take 1 tablet by mouth daily.    . nitroGLYCERIN (NITROSTAT) 0.4 MG SL tablet 1 TAB UNDER TONGUE AS NEEDED FOR CHEST PAIN. MAY REPEAT EVERY 5 MIN FOR A TOTAL OF 3 DOSES. (Patient taking differently:  Place 0.4 mg under the tongue every 5 (five) minutes as needed for chest pain. ) 25 tablet 2  . rosuvastatin (CRESTOR) 10 MG tablet TAKE 1 TABLET ONCE DAILY. (Patient taking differently: Take 10 mg by mouth daily. ) 90 tablet 0  . tamsulosin (FLOMAX) 0.4 MG CAPS capsule Take 0.4 mg by mouth daily.    [name] vortioxetine HBr (TRINTELLIX) 5 MG TABS tablet Take 1 tablet (5 mg total) by mouth daily. (Patient not taking: Reported on 07/20/2019) 30 tablet 1   No current facility-administered medications for this visit.      Allergies: No Known Allergies  Past Medical History, Surgical history, Social history, and Family History remains without any change on review.  Physical Exam:   Blood pressure 126/72, pulse 72, temperature 98 F (36.7 C), temperature source Oral, resp. rate 17, height 5\' 10"  (1.778 m), weight 150 lb 11.2 oz (68.4 kg), SpO2 98 %.    ECOG: 1   General appearance: Comfortable appearing without any discomfort Head: Normocephalic without any trauma Oropharynx: Mucous membranes are moist and pink without any thrush or ulcers. Eyes: Pupils are equal and round reactive to light. Lymph nodes: No cervical, supraclavicular, inguinal or axillary lymphadenopathy.   Heart:regular rate and rhythm.  S1 and S2 without leg edema. Lung: Clear without any rhonchi or wheezes.  No dullness to percussion. Abdomin: Soft, nontender, nondistended with good bowel sounds.  No hepatosplenomegaly. Musculoskeletal: No joint deformity or effusion.  Full range of motion noted. Neurological: No deficits noted on motor, sensory and deep tendon reflex exam. Skin: Diffuse hemorrhagic bullae noted on his fingers and toes.  Not much different previous examination.       Lab Results: Lab Results  Component Value Date   WBC 5.2 07/20/2019   HGB 13.2 07/20/2019   HCT 39.7 07/20/2019   MCV 100.8 (H) 07/20/2019   PLT 49 (L) 07/20/2019     Chemistry      Component Value Date/Time   NA 141 07/20/2019  1516   NA 142 07/14/2019 0953   NA 141 09/21/2017 1101   K 4.1 07/20/2019 1516   K 4.4 09/21/2017 1101   CL 108 07/20/2019 1516   CO2 23 07/20/2019 1516   CO2 29 09/21/2017 1101   BUN 19 07/20/2019 1516   BUN 19 07/14/2019 0953   BUN 18.7 09/21/2017 1101   CREATININE 0.81 07/20/2019 1516   CREATININE 0.95 05/13/2019 1300   CREATININE 0.9 09/21/2017 1101      Component Value Date/Time   CALCIUM 8.4 (L) 07/20/2019 1516   CALCIUM 8.7 09/21/2017 1101   ALKPHOS 63 07/14/2019 0953   ALKPHOS 79 09/21/2017 1101   AST 18 07/14/2019 0953   AST 14 (L) 05/13/2019 1300   AST 22 09/21/2017 1101   ALT 8 07/14/2019 0953   ALT 9 05/13/2019 1300   ALT 16 09/21/2017  1101   BILITOT 0.5 07/14/2019 0953   BILITOT 0.5 05/13/2019 1300   BILITOT 0.70 09/21/2017 1101       Impression and Plan:  83 year old man with:       1.  Thrombocytopenia dating back to 2018.  His platelet count has ranged between 40-80 without any recent intervention.  He has been treated with prednisone in the past with reasonable response.   His most recent platelet count in September 2020 was 49 without any active bleeding.  Treatment options for this disease includes trial of steroids, IVIG or rituximab.  These may be needed in the immediate future because of his dermatological condition.  His platelet count is adequate at this time and does not require any intervention.   2. Prostate cancer: He has developed urinary stricture as well and required a suprapubic catheter.  He continues to be on Xtandi at this time.  The natural course of this disease was reiterated.  The role for systemic chemotherapy was also discussed which will be deferred because of his other active issues.  3.  Bullous pemphigoid: He is currently followed by dermatology locally as well as at [facility].  Treatment options that include methotrexate, IVIG and rituximab were discussed with his dermatologist.  He is currently on methotrexate  and additional therapy may be needed if this is unsuccessful.   4.  Melanoma of the chest wall that remains without any recurrence since 2017.   5. Follow-up: 3 months for repeat evaluation and sooner if needed.   25  minutes was spent with the patient face-to-face today.  More than 50% of time was spent on reviewing his disease status, treatment options, complications related therapy as well as reviewing future plan of care.   Zola Button, MD 10/1/20209:50 AM

## 2019-08-14 NOTE — Patient Instructions (Signed)
DUE TO COVID-19 ONLY ONE VISITOR IS ALLOWED TO COME WITH YOU AND STAY IN THE WAITING ROOM ONLY DURING PRE OP AND PROCEDURE. THE ONE VISITOR MAY VISIT WITH YOU IN YOUR PRIVATE ROOM DURING VISITING HOURS ONLY!!   COVID SWAB TESTING COMPLETED ON: Oct. 1, 2020. (Must self quarantine after testing. Follow instructions on handout.)             Your procedure is scheduled on: Monday, Oct. 5, 2020   Report to [facility] Main  Entrance    Report to admitting at 10:30 AM   Call this number if you have problems the morning of surgery [phone]   Do not eat food or drink liquids :After Midnight.   Brush your teeth the morning of surgery.   Do NOT smoke after Midnight   Take these medicines the morning of surgery with A SIP OF WATER:  Carvedilol, Duloxetine, Xtandi, Isosorbide, Rosuvastatin              You may not have any metal on your body including jewelry, and body piercings             Do not wear lotions, powders, perfumes/cologne, or deodorant                           Men may shave face and neck.   Do not bring valuables to the hospital. [place].   Contacts, dentures or bridgework may not be worn into surgery.    Patients discharged the day of surgery will not be allowed to drive home.   Special Instructions: Bring a copy of your healthcare power of attorney and living will documents         the day of surgery if you haven't scanned them in before.              Please read over the following fact sheets you were given:  [facility] - Preparing for Surgery Before surgery, you can play an important role.  Because skin is not sterile, your skin needs to be as free of germs as possible.  You can reduce the number of germs on your skin by washing with CHG (chlorahexidine gluconate) soap before surgery.  CHG is an antiseptic cleaner which kills germs and bonds with the skin to continue killing germs even after  washing. Please DO NOT use if you have an allergy to CHG or antibacterial soaps.  If your skin becomes reddened/irritated stop using the CHG and inform your nurse when you arrive at Short Stay. Do not shave (including legs and underarms) for at least 48 hours prior to the first CHG shower.  You may shave your face/neck.  Please follow these instructions carefully:  1.  Shower with CHG Soap the night before surgery and the  morning of surgery.  2.  If you choose to wash your hair, wash your hair first as usual with your normal  shampoo.  3.  After you shampoo, rinse your hair and body thoroughly to remove the shampoo.                             4.  Use CHG as you would any other liquid soap.  You can apply chg directly to the skin and wash.  Gently with  a scrungie or clean washcloth.  5.  Apply the CHG Soap to your body ONLY FROM THE NECK DOWN.   Do   not use on face/ open                           Wound or open sores. Avoid contact with eyes, ears mouth and   genitals (private parts).                       Wash face,  Genitals (private parts) with your normal soap.             6.  Wash thoroughly, paying special attention to the area where your    surgery  will be performed.  7.  Thoroughly rinse your body with warm water from the neck down.  8.  DO NOT shower/wash with your normal soap after using and rinsing off the CHG Soap.                9.  Pat yourself dry with a clean towel.            10.  Wear clean pajamas.            11.  Place clean sheets on your bed the night of your first shower and do not  sleep with pets. Day of Surgery : Do not apply any lotions/deodorants the morning of surgery.  Please wear clean clothes to the hospital/surgery center.  FAILURE TO FOLLOW THESE INSTRUCTIONS MAY RESULT IN THE CANCELLATION OF YOUR SURGERY  PATIENT SIGNATURE_________________________________  NURSE  SIGNATURE__________________________________  ________________________________________________________________________

## 2019-08-15 ENCOUNTER — Encounter (HOSPITAL_COMMUNITY): Payer: Self-pay

## 2019-08-15 ENCOUNTER — Encounter (HOSPITAL_COMMUNITY)
Admission: RE | Admit: 2019-08-15 | Discharge: 2019-08-15 | Disposition: A | Discharge status: Home / Self Care | Payer: Medicare Other | Source: Ambulatory Visit | Attending: Family Medicine | Admitting: Family Medicine

## 2019-08-15 ENCOUNTER — Other Ambulatory Visit: Payer: Self-pay

## 2019-08-15 HISTORY — DX: Cardiomegaly: I51.7

## 2019-08-15 HISTORY — DX: Other cystostomy status: Z93.59

## 2019-08-15 HISTORY — DX: Bullous pemphigoid: L12.0

## 2019-08-15 HISTORY — DX: Malignant neoplasm of bone and articular cartilage, unspecified: C41.9

## 2019-08-15 LAB — NOVEL CORONAVIRUS, NAA (HOSP ORDER, SEND-OUT TO REF LAB; TAT 18-24 HRS): SARS-CoV-2, NAA: NOT DETECTED

## 2019-08-15 LAB — PROSTATE-SPECIFIC AG, SERUM (LABCORP): Prostate Specific Ag, Serum: 2.3 ng/mL (ref 0.0–4.0)

## 2019-08-15 NOTE — Progress Notes (Signed)
SPOKE W/  Iona Beard     SCREENING SYMPTOMS OF COVID 19:   COUGH--NO  RUNNY NOSE--- NO  SORE THROAT---NO  NASAL CONGESTION----NO  SNEEZING----NO  SHORTNESS OF BREATH---NO  DIFFICULTY BREATHING--NO-  TEMP >100.0 -----NO  UNEXPLAINED BODY ACHES------NO  CHILLS -------- NO  HEADACHES ---------NO  LOSS OF SMELL/ TASTE --------NO    HAVE YOU OR ANY FAMILY MEMBER TRAVELLED PAST 14 DAYS OUT OF THE   COUNTY---NO STATE----NO COUNTRY----NO  HAVE YOU OR ANY FAMILY MEMBER BEEN EXPOSED TO ANYONE WITH COVID 19? NO

## 2019-08-15 NOTE — Anesthesia Preprocedure Evaluation (Addendum)
Anesthesia Evaluation  Patient identified by MRN, date of birth, ID band Patient awake    Reviewed: Allergy & Precautions, NPO status , Patient's Chart, lab work & pertinent test results, reviewed documented beta blocker date and time   Airway Mallampati: II  TM Distance: >3 FB Neck ROM: Full    Dental no notable dental hx. (+) Teeth Intact, Dental Advisory Given   Pulmonary former smoker,    Pulmonary exam normal breath sounds clear to auscultation       Cardiovascular hypertension, Pt. on medications and Pt. on home beta blockers + CAD and +CHF  Normal cardiovascular exam+ Valvular Problems/Murmurs  Rhythm:Regular Rate:Normal  Last echo 2019:  - Left ventricle: The cavity size was normal. Wall thickness was   increased in a pattern of moderate LVH. Systolic function was   normal. The estimated ejection fraction was in the range of 60%   to 65%. Wall motion was normal; there were no regional wall   motion abnormalities. Doppler parameters are consistent with   abnormal left ventricular relaxation (grade 1 diastolic   dysfunction). - Aortic valve: A bioprosthesis was present. - Mitral valve: Valve area by pressure half-time: 2.5 cm^2.   LHC 8/16: Mid to distal LAD 30%, OM1 40%, proximal mid RCA 40%, distal RCA 60% >> FFR 0.69  >> PCI: 3 x 15 mm Resolute DES  CHFpEF  AS s/p TAVR 2017   Neuro/Psych PSYCHIATRIC DISORDERS Depression negative neurological ROS     GI/Hepatic Neg liver ROS, GERD  ,  Endo/Other  negative endocrine ROS  Renal/GU Hx nephrolithiasis   Hx prostate ca with bone mets s/p radiation and TURP, suprapubic catheter    Musculoskeletal negative musculoskeletal ROS (+)   Abdominal Normal abdominal exam  (+)   Peds  Hematology Hx ITP in 2018   Anesthesia Other Findings   Reproductive/Obstetrics negative OB ROS                           Anesthesia Evaluation  Patient identified by MRN, date of birth, ID band Patient awake    Reviewed: Allergy & Precautions, H&P , NPO status , Patient's Chart, lab work & pertinent test results  Airway Mallampati: II   Neck ROM: full    Dental  (+) Teeth Intact, Caps, Dental Advisory Given,    Pulmonary former smoker,    breath sounds clear to auscultation       Cardiovascular + angina + CAD, + Cardiac Stents and + Peripheral Vascular Disease  + Valvular Problems/Murmurs  Rhythm:regular Rate:Normal  S/p TAVR (2017)   Echo 09/2018 - Left ventricle: The cavity size was normal. Wall thickness was increased in a pattern of moderate LVH. Systolic function was normal. The estimated ejection fraction was in the range of 60% to 65%. Wall motion was normal; there were no regional wall motion abnormalities. Doppler parameters are consistent with abnormal left ventricular relaxation (grade 1 diastolic dysfunction). - Aortic valve: A bioprosthesis was present. - Mitral valve: Valve area by pressure half-time: 2.5 cm^2.   Neuro/Psych PSYCHIATRIC DISORDERS Depression    GI/Hepatic GERD  ,  Endo/Other    Renal/GU      Musculoskeletal   Abdominal   Peds  Hematology  (+) Blood dyscrasia, anemia ,   Anesthesia Other Findings   Reproductive/Obstetrics  Anesthesia Physical  Anesthesia Plan  ASA: III  Anesthesia Plan: General   Post-op Pain Management:    Induction: Intravenous  PONV Risk Score and Plan: 2 and Ondansetron, Dexamethasone and Treatment may vary due to age or medical condition  Airway Management Planned: LMA  Additional Equipment:   Intra-op Plan:   Post-operative Plan: Extubation in OR  Informed Consent: I have reviewed the patients History and Physical, chart, labs and discussed the procedure including the risks, benefits and alternatives for the proposed anesthesia with the patient or  authorized representative who has indicated his/her understanding and acceptance.     Dental advisory given  Plan Discussed with: CRNA  Anesthesia Plan Comments:         Anesthesia Quick Evaluation  Anesthesia Physical Anesthesia Plan  ASA: III  Anesthesia Plan: General   Post-op Pain Management:    Induction: Intravenous  PONV Risk Score and Plan: 2 and Ondansetron, Dexamethasone and Treatment may vary due to age or medical condition  Airway Management Planned: LMA  Additional Equipment: None  Intra-op Plan:   Post-operative Plan: Extubation in OR  Informed Consent: I have reviewed the patients History and Physical, chart, labs and discussed the procedure including the risks, benefits and alternatives for the proposed anesthesia with the patient or authorized representative who has indicated his/her understanding and acceptance.     Dental advisory given  Plan Discussed with: CRNA  Anesthesia Plan Comments:        Anesthesia Quick Evaluation

## 2019-08-15 NOTE — Progress Notes (Signed)
Anesthesia Chart Review   Case: [id number] Date/Time: 08/18/19 1215   Procedure: CYSTOSCOPY WITH URETHRAL DILATATION USING BALLOON OR LASER/ SPURO PUBIC TUBE CHANGE (N/A )   Anesthesia type: General   Pre-op diagnosis: URETERAL STRICTURE   Location: [place] / [facility]   Surgeon: [name], MD      DISCUSSION:83 y.o. former smoker with h/o HLD, CAD, AS s/p TAVR 02/15/2016, BPPV, prostate cancer with metastasis, ureteral stricture scheduled for above procedure 08/18/2019 with [name].     Pt last seen by cardiologist, Dr. [name], 07/07/2019.  Per OV note, "The patient's chest pain is highly atypical.  It is near constant nature with no relationship to physical activity does not suggest typical angina related to coronary artery disease.  He had a stress echocardiogram in November which showed no evidence of ischemia.  I recommend that he continue on low-dose aspirin, low-dose carvedilol, and a statin drug with Crestor 10 mg daily.  I have ordered a chest x-ray to evaluate for chest wall abnormality as a cause of his pain.  Treated with Crestor 10 mg daily.  Most recent lipids reviewed.  LFTs from May 13, 2023 within normal limits.  The patient's aortic valve prosthesis appears to be functioning normally.  Most recent echocardiogram from last year is reviewed as above.  He remains on aspirin for antiplatelet therapy and he follows SBE prophylaxis per guideline recommendations."  Pt no showed for PAT visit.  Nurse spoke with pt over the phone, stable at this time. Anesthesia to evaluate DOS.    S/p cystoscopy 12/23/2018 with no anesthesia complications.   VS: There were no vitals taken for this visit.  PROVIDERS: [name], MD is PCP   [name], MD is Cardiologist  LABS: Labs reviewed: Acceptable for surgery. (all labs ordered are listed, but only abnormal results are displayed)  Labs Reviewed - No data to display   IMAGES: Chest Xray  07/07/2019 FINDINGS: Stable large lung volumes. Mediastinal contours are stable and within normal limits. Small calcified hilar lymph nodes redemonstrated on the left. Sequelae of TAVR redemonstrated. No pneumothorax, pulmonary edema, pleural effusion or acute pulmonary opacity.  No acute osseous abnormality identified. Mild Calcified aortic atherosclerosis. Negative visible bowel gas pattern.  IMPRESSION: 1.  No acute cardiopulmonary abnormality. 2.  Aortic Atherosclerosis (ICD10-I70.0).  EKG: 07/07/2019 Rate 71 bpm Sinus rhythm with marked sinus arrhythmia  CV: Echo 10/03/2018 Study Conclusions  - Left ventricle: The cavity size was normal. Wall thickness was   increased in a pattern of moderate LVH. Systolic function was   normal. The estimated ejection fraction was in the range of 60%   to 65%. Wall motion was normal; there were no regional wall   motion abnormalities. Doppler parameters are consistent with   abnormal left ventricular relaxation (grade 1 diastolic   dysfunction). - Aortic valve: A bioprosthesis was present. - Mitral valve: Valve area by pressure half-time: 2.5 cm^2.  Cardiac Cath 12/26/2016  Prox RCA lesion, 35 %stenosed.  Mid RCA lesion, 30 %stenosed.  Dist RCA lesion, 0 %stenosed.  Mid LAD lesion, 20 %stenosed.   Mild nonobstructive CAD with smooth 20% narrowing in the LAD after the proximal septal perforating artery; normal left circumflex coronary artery; mild calcification of the RCA with 30-40% proximal stenosis, 30% mid stenosis, and patent distal RCA stent.  The TAVR valve appeared well-seated.  RECOMMENDATION: Medical therapy.  Consider possible nonischemic causes of chest pain.  Hematology consultation is recommended for further evaluation of  the patient's development of significant thrombocytopenia.  Myocardial Perfusion 12/25/2016  The left ventricular ejection fraction is hyperdynamic (>65%).  Nuclear stress EF: 67%.  Blood  pressure demonstrated a normal response to exercise.  There was no ST segment deviation noted during stress.  There is a small defect of mild severity present in the basal inferoseptal, mid inferoseptal, mid inferolateral and apical lateral location. The defect is non-reversible.  There is a medium defect of severe severity present in the mid inferior, mid inferolateral and apical inferior location. The defect is reversible and consistent with ischemia.  This is a high risk study. Past Medical History:  Diagnosis Date  . Abdominal aortic atherosclerosis ([place])   . Anginal pain ([place])   . BPPV (benign paroxysmal positional vertigo)   . Bullous pemphigoid   . CAD (coronary artery disease)    a.  LHC 8/16: Mid to distal LAD 30%, OM1 40%, proximal mid RCA 40%, distal RCA 60% >> FFR 0.69  >> PCI: 3 x 15 mm Resolute DES  . Depression   . Esophageal reflux   . Family history of adverse reaction to anesthesia    "daughter has PONV"  . Grade I diastolic dysfunction [id number]   Noted on ECHO   . Grade I diastolic dysfunction [id number]   Noted on ECHO  . H/O blood clots    "get them in my stool and urine" (11/12/2015)  . Heart murmur   . Hepatic lesion 12/08/2015   Stable 8 mm right hepatic lesion  . History of aortic stenosis    a. peak to peak gradient by LHC 8/16:  37 mmHg (moderately severe)   . History of appendicitis 2016  . History of blood transfusion 12/2018  . History of herpes labialis   . History of ITP    2018, thrombocytopenia  . History of kidney stones   . Hx of radiation therapy 04/14/13-06/09/13   prostate 7800 cGy, 40 sessions, seminal vesicles 5600 cGy 40 sessions  . Hydrocele 2006   Small  . Hyperlipidemia   . Insomnia    resolved  . LVH (left ventricular hypertrophy)    Moderate, noted on ECHO 09/2018  . Malignant melanoma in situ ([place]) 09/04/2016   Right neck and chest  . Melanoma ([place])   . Metastasis from malignant neoplasm of prostate ([place]) 02/25/2019  .  Prostate cancer ([place]) dx'd 2014  . Radiation proctitis   . Renal mass 2017   Bilateral renal masses  . S/P skin biopsy 04/09/2019   subepidermal cell poor vesicle , Linear IGG and C3 the basement membrane  . Suprapubic catheter ([place])   . Wears hearing aid in both ears     Past Surgical History:  Procedure Laterality Date  . CARDIAC CATHETERIZATION N/A 04/21/2015   Procedure: Right/Left Heart Cath and Coronary Angiography;  Surgeon: [name], MD;  Location: [place] [facility];  Service: Cardiovascular;  Laterality: N/A;  . CARDIAC CATHETERIZATION N/A 06/18/2015   Procedure: Intravascular Pressure Wire/FFR Study;  Surgeon: [name], MD;  Location: [place] [facility];  Service: Cardiovascular;  Laterality: N/A;  . CARDIAC CATHETERIZATION N/A 06/18/2015   Procedure: Coronary Stent Intervention;  Surgeon: [name], MD;  Location: [place] [facility];  Service: Cardiovascular;  Laterality: N/A;  . CARDIAC CATHETERIZATION N/A 06/18/2015   Procedure: Right/Left Heart Cath and Coronary Angiography;  Surgeon: [name], MD;  Location: [place] [facility];  Service: Cardiovascular;  Laterality: N/A;  . CARDIAC CATHETERIZATION N/A 06/23/2015  Procedure: Left Heart Cath and Cors/Grafts Angiography;  Surgeon: [name], MD;  Location: [place] [facility];  Service: Cardiovascular;  Laterality: N/A;  . CARDIAC CATHETERIZATION N/A 01/17/2016   Procedure: Right/Left Heart Cath and Coronary Angiography;  Surgeon: [name], MD;  Location: [place] [facility];  Service: Cardiovascular;  Laterality: N/A;  . CATARACT EXTRACTION, BILATERAL    . COLONOSCOPY  06/26/2017  . CYSTOSCOPY WITH BIOPSY N/A 07/30/2018   Procedure: CYSTOSCOPY WITH BIOPSY/FULGURATION, CYSTOLTHOLAPAXY;  Surgeon: [name], MD;  Location: [facility];  Service: Urology;  Laterality: N/A;  . FRACTURE SURGERY    . INGUINAL HERNIA REPAIR     patient does not remember this procedure  . LAPAROSCOPIC  APPENDECTOMY N/A 11/16/2015   Procedure: APPENDECTOMY LAPAROSCOPIC;  Surgeon: [name], MD;  Location: [place];  Service: General;  Laterality: N/A;  . LEFT HEART CATH AND CORONARY ANGIOGRAPHY N/A 12/26/2016   Procedure: Left Heart Cath and Coronary Angiography;  Surgeon: Troy Sine, MD;  Location: [place] [facility];  Service: Cardiovascular;  Laterality: N/A;  . MELANOMA EXCISION Right 10/24/2016   Procedure: WIDE EXCISION MELANOMA RIGHT NECK AND RIGHT CHEST TIMES 2;  Surgeon: [name], MD;  Location: [place];  Service: General;  Laterality: Right;  right medial and lateral lesion of chest and right neck  . NASAL FRACTURE SURGERY     "broken years ago; several ORs to correct it"  . NASAL SEPTUM SURGERY    . PROSTATE BIOPSY  2014   "needle biopsy"  . TEE WITHOUT CARDIOVERSION N/A 02/15/2016   Procedure: TRANSESOPHAGEAL ECHOCARDIOGRAM (TEE);  Surgeon: [name], MD;  Location: [place];  Service: Open Heart Surgery;  Laterality: N/A;  . TONSILLECTOMY    . TRANSCATHETER AORTIC VALVE REPLACEMENT, TRANSFEMORAL  02/15/2016  . TRANSCATHETER AORTIC VALVE REPLACEMENT, TRANSFEMORAL N/A 02/15/2016   Procedure: TRANSCATHETER AORTIC VALVE REPLACEMENT, TRANSFEMORAL;  Surgeon: [name], MD;  Location: [place];  Service: Open Heart Surgery;  Laterality: N/A;  . TRANSURETHRAL RESECTION OF PROSTATE  12/23/2018   Procedure: CYSTOSCOPY WITH  BLADDER BIOPSY WITH FULGERATION/ TRANSURETHRAL RESECTION PROSTATE  AND PROSTATE BIOPSY;  Surgeon: [name], MD;  Location: [facility];  Service: Urology;;    MEDICATIONS: . acetaminophen (TYLENOL) 500 MG tablet  . amoxicillin (AMOXIL) 500 MG tablet  . aspirin EC 81 MG tablet  . carvedilol (COREG) 3.125 MG tablet  . cyanocobalamin 2000 MCG tablet  . DULoxetine (CYMBALTA) 30 MG capsule  . enzalutamide (XTANDI) 40 MG capsule  . ferrous sulfate 325 (65 FE) MG EC tablet  . folic acid (FOLVITE) 1 MG tablet  . isosorbide mononitrate  (IMDUR) 30 MG 24 hr tablet  . methotrexate (RHEUMATREX) 2.5 MG tablet  . MULTIPLE VITAMIN PO  . nitroGLYCERIN (NITROSTAT) 0.4 MG SL tablet  . rosuvastatin (CRESTOR) 10 MG tablet  . tamsulosin (FLOMAX) 0.4 MG CAPS capsule  . vortioxetine HBr (TRINTELLIX) 5 MG TABS tablet   No current facility-administered medications for this encounter.     Maia Plan [facility] Pre-Surgical Testing [phone] 08/15/19  4:20 PM

## 2019-08-15 NOTE — H&P (Signed)
Office Visit Report     08/04/2019   --------------------------------------------------------------------------------   Corey Oneill  MRN: [id number]  DOB: 1936-06-12, 83 year old Male  SSN: [id number]   PRIMARY CARE:  [name], MD  REFERRING:  [name], MD  PROVIDER:  [name], M.D.  LOCATION:  [facility] [phone]     --------------------------------------------------------------------------------   CC: I have prostate cancer.  HPI: Corey Oneill is a 83 year-old male established patient who is here evaluation for treatment of prostate cancer.  His prostate cancer was diagnosed 01/22/2013. He does have the pathology report from his biopsy. His cancer was diagnosed by [facility].   He has undergone External Beam Radiation Therapy for treatment.   He has Stage IVB CRPC (TxNxM1b) following XRT in July 2014-completed IMRT without androgen deprivation. PSA rapid rise to Jun 2017 PSA 9.59.   -Staging:  - Jul 2019 - CT - no mets, stable mixed lucency lesion of the left iliac bone (negative on prior bone scan, seen in 2016). No gross hematuria. Minimal incontinence. Using a depend and guard.  -Jan and Feb 2020 - CT and bone scan with confirmed left iliac met   Started IADT Jul 2017. He became castrate resistant Aug 2019. AUASS was 14. We continued Lupron 30 mg / Aug 2019. PSA is down to 2.6, but up to 3.54 with a T < 10. He had another Lupron 30 mg / 4 month Nov 12, 2018, but PSA rose to 5.55 with a T of <10 and then PSA 6.8.   He received Lupron Lupron 30 mg/4 mo and Xgeva (dental cleared) on 03/17/2019. He is well without dysuria. He has nocturia. He started enzalutamide April 2020.   His PSA is now down to 0.9. Calcium 8.5. Creatinine 0.7. Vitamin-D 40. Testosterone less than 10.     CC/HPI: I have a urethral stricture.     Patient has had bothersome frequency and urgency worse since September 2019 after he had cystoscopy and a bladder  neck stone removal for gross hematuria. He also had radiation for prostate cancer back in 2014. He had retention and hematuria with cystoscopy with TURBT 0.5 cm to 2 cm, fulguration of right bladder neck, and limited TURP, which he underwent on 12/23/18.   He had more of a weak flow and hesitancy. Bladder scan only 104 ml. He has some memory issues. He also is having urgency and UUI. We added tamsulosin 06/2019 but he is on alfuzosin. Today, he c/o frequency, weak stream, even a heavy drip. PVR 462m but he didn't void prior. He voided but missed the specimen cup. PVR was 210 ml. He continues alfuzosin.   He into retention again and Dr. WJeffie Pollockdid cystoscopy after Foley catheter could not be placed. A stricture was noted an access failed. He underwent a suprapubic tube on 07/20/2019.    ALLERGIES: No Allergies    MEDICATIONS: Doxycycline Hyclate 100 mg tablet  Acetaminophen 500 mg tablet Oral PRN  Alfuzosin Hcl Er 10 mg tablet, extended release 24 hr  Aspirin Ec 81 mg tablet, delayed release  Calcium + D  Carvedilol 3.125 mg tablet  Clobetasol Propionate 0.05 % ointment  Ferrous Sulfate  Halobetasol Propionate 0.05 % cream  Isosorbide Mononitrate Er 30 mg tablet, extended release 24 hr 1/2 tablet PO Daily  Multiple Vitamin  Nitroglycerin 0.4 MG Sublingual Tablet Sublingual Sublingual  Xtandi 40 mg capsule     GU PSH: Catheterization For Collection Of Specimen, Single  Patient, All Places Of Service - 12/31/2018 Cysto Bladder Stone <2.5cm - 07/30/2018 Cystoscopy - 07/20/2019, 12/04/2018, 06/25/2018, 2017 Cystoscopy TURBT <2 cm - 07/30/2018 Cystoscopy TURBT 2-5 cm - 12/23/2018 Locm 300-399Mg/Ml Iodine,1Ml - 12/09/2018, 06/05/2018, 2017 Xgeva - 03/17/2019     NON-GU PSH: Cardiac Stent Placement Heart Surgery (Unspecified) Nose Surgery (Unspecified) Remove Tonsils - 2014     GU PMH: Incomplete bladder emptying - 07/16/2019 Urinary Frequency (Stable) - 07/16/2019, Increased urinary frequency, -  2017 BPH w/LUTS - 06/16/2019 Prostate Cancer - 06/16/2019, - 03/24/2019, - 03/05/2019, - 01/20/2019, - 01/06/2019, - 12/31/2018, - 12/30/2018, - 12/16/2018, - 12/12/2018, - 12/04/2018, - 12/02/2018, - 11/29/2018, - 11/22/2018, - 11/20/2018, - 10/29/2018, - 08/26/2018, - 07/05/2018, - 06/25/2018, - 05/30/2018, - 02/06/2018, - 2018, - 2017, - 2017, - 2017, Prostate cancer, - 2016 Urinary Urgency (Stable) - 06/16/2019, - 08/07/2018 Weak Urinary Stream - 06/16/2019 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 Nocturia (Stable) - 03/05/2019, - 02/06/2018 Secondary bone metastases - 03/05/2019 Gross hematuria - 01/20/2019, - 01/06/2019, - 12/31/2018, - 12/30/2018, - 12/16/2018, - 12/12/2018, - 12/05/2018, - 12/04/2018, - 12/02/2018, - 11/29/2018, - 11/22/2018, - 11/20/2018, - 08/07/2018, - 07/05/2018, - 06/25/2018, - 05/30/2018, - 2017, Gross hematuria, - 2016 Urinary Tract Inf, Unspec site - 01/20/2019, - 01/06/2019 Urinary Retention, Unspec - 12/02/2018 Urinary Retention - 11/29/2018, - 11/22/2018 ED due to arterial insufficiency - 07/05/2018 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      NON-GU PMH: Encounter for general adult medical examination without abnormal findings, Encounter for preventive health examination - 2015 Personal history of other diseases of the digestive system, History of esophageal reflux - 2014 Personal history of other endocrine, nutritional and metabolic disease, History of hypercholesterolemia - 2014    FAMILY HISTORY: Brain Cancer - Father Death In The Family Father - Runs In Family Death In The Family Mother - Runs In Family non-Hodgkin's lymphoma - Mother   SOCIAL HISTORY: Marital Status: Married Preferred Language: English; Ethnicity: Not Hispanic Or Latino; Race: White Current Smoking Status: Patient does not smoke  anymore.  Types of alcohol consumed: Beer, Liquor, Wine.  Does not drink caffeine. Patient's occupation Brewing technologist.     Notes: Former smoker, History of tobacco use, Alcohol Use, Occupation:, Marital History - Currently Married   REVIEW OF SYSTEMS:    GU Review Male:   Patient denies frequent urination, hard to postpone urination, burning/ pain with urination, get up at night to urinate, leakage of urine, stream starts and stops, trouble starting your stream, have to strain to urinate , erection problems, and penile pain.  Gastrointestinal (Upper):   Patient denies nausea, vomiting, and indigestion/ heartburn.  Gastrointestinal (Lower):   Patient denies constipation and diarrhea.  Constitutional:   Patient denies fever, night sweats, weight loss, and fatigue.  Skin:   Patient denies skin rash/ lesion and itching.  Eyes:   Patient denies blurred vision and double vision.  Ears/ Nose/ Throat:   Patient denies sore throat and sinus problems.  Hematologic/Lymphatic:   Patient denies swollen glands and easy bruising.  Cardiovascular:   Patient denies leg swelling and chest pains.  Respiratory:   Patient denies cough and shortness of breath.  Endocrine:   Patient denies excessive thirst.  Musculoskeletal:   Patient denies back pain and joint pain.  Neurological:   Patient  denies headaches and dizziness.  Psychologic:   Patient denies depression and anxiety.   Notes: SP tube draining well. Patient noticed occasionally bloody urine, but overall clear yellow.    VITAL SIGNS:      08/04/2019 09:31 AM  BP 116/73 mmHg  Pulse 65 /min  Temperature 98.0 F / 36.6 C   MULTI-SYSTEM PHYSICAL EXAMINATION:    Constitutional: Well-nourished. No physical deformities. Normally developed. Good grooming.  Neck: Neck symmetrical, not swollen. Normal tracheal position.  Respiratory: No labored breathing, no use of accessory muscles.   Cardiovascular: Normal temperature, normal extremity pulses, no  swelling, no varicosities.  Skin: No paleness, no jaundice, no cyanosis. No lesion, no ulcer, no rash.  Neurologic / Psychiatric: Oriented to time, oriented to place, oriented to person. No depression, no anxiety, no agitation.  Gastrointestinal: No mass, no tenderness, no rigidity, non obese abdomen. SP tube in place. Draining well.      PAST DATA REVIEWED:  Source Of History:  Patient  X-Ray Review: C.T. Abdomen/Pelvis: Reviewed Films. 07/20/2019    07/29/19 03/24/19 02/28/19 01/06/19 12/04/18 10/08/18 08/20/18 07/05/18  PSA  Total PSA 0.93 ng/mL 2.16 ng/mL 9.62 ng/mL 6.81 ng/mL 5.55 ng/mL 3.54 ng/mL 2.60 ng/mL 3.01 ng/mL    07/29/19 01/06/19 12/04/18 10/08/18 07/05/18 05/13/18 01/28/18 08/24/16  Hormones  Testosterone, Total <10 ng/dL <10 ng/dL <10 ng/dL <10 ng/dL <10 ng/dL <10 ng/dL 337.7 ng/dL 9.3 pg/dL    PROCEDURES:          Eligard 38m/ 6 Month - 96402, X9217 The buttock was sterilely prepped with alcohol. Eligard was injected subcutaneously ([place]) using standard technique. The patient tolerated the procedure well. A band aid was applied. The site was dry when the patient left the exam room. The patient will return as scheduled.   Qty: 45 Adm. By: LAlona Bene Unit: mg Lot No [id number] Route: SQ Exp. Date 02/10/2021  Freq: None Mfgr.:   Site: Left Buttock         Xgeva - [id number] arm prepped with alcohol. Xgeva injected. Patient tolerated well.   Qty: 120 Adm. By: LAlona Bene Unit: mg Lot No [id number] Route: SQ Exp. Date 09/12/2021  Freq: Q1M Mfgr.:   Site: Right Arm   ASSESSMENT:      ICD-10 Details  1 GU:   Bulbar urethral stricture - N35.011   2   Prostate Cancer - C61    PLAN:           Schedule Labs: 3 Months - PSA    3 Months - Total Testosterone  Return Visit/Planned Activity: 3 Months - [facility]  Return Visit/Planned Activity: Next Available Appointment - Schedule Surgery          Document Letter(s):  Created for Patient: Clinical  Summary         Notes:   Prostate cancer-doing well. Discussed again his stage and prognosis. He was given Eligard 45 mg today and Xgeva 120 mg.   Urethral stricture-I discussed with the patient and his wife the nature of the urethral stricture. We discussed options of keeping the suprapubic tube or proceeding with attempt to dilate the stricture. Dilation will risk recurrence, urinary frequency, incontinence and failure to get access among others. They elect to proceed with urethral stricture dilation and suprapubic tube removal. They said he would rather have incontinence and void into the diaper. They do not feel like he nor a family member can perform CIC. He is here with his wife  today. Also, given hematuria on and off cysto will eval the bladder again.   cc: Dr. Redmond School        Next Appointment:      Next Appointment: 10/13/2019 11:00 AM    Appointment Type: Laboratory Appointment    Location: [facility] [phone]    Provider: Lab LAB    Reason for Visit: PSA, Total Test - Kirsti Mcalpine      * Signed by [name], M.D. on 08/06/19 at 10:04 AM (EDT)*     The information contained in this medical record document is considered private and confidential patient information. This information can only be used for the medical diagnosis and/or medical services that are being provided by the patient's selected caregivers. This information can only be distributed outside of the patient's care if the patient agrees and signs waivers of authorization for this information to be sent to an outside source or route.

## 2019-08-18 ENCOUNTER — Encounter (HOSPITAL_COMMUNITY): Payer: Self-pay | Admitting: *Deleted

## 2019-08-18 ENCOUNTER — Ambulatory Visit (HOSPITAL_COMMUNITY): Payer: Medicare Other | Admitting: Certified Registered"

## 2019-08-18 ENCOUNTER — Encounter (HOSPITAL_COMMUNITY)
Admission: RE | Disposition: A | Discharge status: Home / Self Care | Payer: Self-pay | Source: Ambulatory Visit | Attending: Urology

## 2019-08-18 ENCOUNTER — Ambulatory Visit (HOSPITAL_COMMUNITY): Payer: Medicare Other

## 2019-08-18 ENCOUNTER — Ambulatory Visit (HOSPITAL_COMMUNITY)
Admission: RE | Admit: 2019-08-18 | Discharge: 2019-08-18 | Disposition: A | Discharge status: Home / Self Care | Payer: Medicare Other | Source: Ambulatory Visit | Attending: Urology | Admitting: Urology

## 2019-08-18 DIAGNOSIS — C801 Malignant (primary) neoplasm, unspecified: Secondary | ICD-10-CM | POA: Diagnosis not present

## 2019-08-18 DIAGNOSIS — K219 Gastro-esophageal reflux disease without esophagitis: Secondary | ICD-10-CM | POA: Diagnosis not present

## 2019-08-18 DIAGNOSIS — R05 Cough: Secondary | ICD-10-CM | POA: Diagnosis not present

## 2019-08-18 DIAGNOSIS — I509 Heart failure, unspecified: Secondary | ICD-10-CM | POA: Insufficient documentation

## 2019-08-18 DIAGNOSIS — Z79899 Other long term (current) drug therapy: Secondary | ICD-10-CM | POA: Insufficient documentation

## 2019-08-18 DIAGNOSIS — N35919 Unspecified urethral stricture, male, unspecified site: Secondary | ICD-10-CM | POA: Diagnosis not present

## 2019-08-18 DIAGNOSIS — Z9889 Other specified postprocedural states: Secondary | ICD-10-CM | POA: Insufficient documentation

## 2019-08-18 DIAGNOSIS — N35012 Post-traumatic membranous urethral stricture: Secondary | ICD-10-CM | POA: Diagnosis not present

## 2019-08-18 DIAGNOSIS — Z923 Personal history of irradiation: Secondary | ICD-10-CM | POA: Insufficient documentation

## 2019-08-18 DIAGNOSIS — A4181 Sepsis due to Enterococcus: Secondary | ICD-10-CM | POA: Diagnosis not present

## 2019-08-18 DIAGNOSIS — C61 Malignant neoplasm of prostate: Secondary | ICD-10-CM

## 2019-08-18 DIAGNOSIS — R509 Fever, unspecified: Secondary | ICD-10-CM | POA: Diagnosis not present

## 2019-08-18 DIAGNOSIS — Z20828 Contact with and (suspected) exposure to other viral communicable diseases: Secondary | ICD-10-CM | POA: Diagnosis not present

## 2019-08-18 DIAGNOSIS — D693 Immune thrombocytopenic purpura: Secondary | ICD-10-CM | POA: Diagnosis not present

## 2019-08-18 DIAGNOSIS — A419 Sepsis, unspecified organism: Secondary | ICD-10-CM | POA: Diagnosis not present

## 2019-08-18 DIAGNOSIS — R000 Tachycardia, unspecified: Secondary | ICD-10-CM | POA: Diagnosis not present

## 2019-08-18 DIAGNOSIS — C7982 Secondary malignant neoplasm of genital organs: Secondary | ICD-10-CM | POA: Diagnosis not present

## 2019-08-18 DIAGNOSIS — N390 Urinary tract infection, site not specified: Secondary | ICD-10-CM | POA: Diagnosis not present

## 2019-08-18 DIAGNOSIS — F329 Major depressive disorder, single episode, unspecified: Secondary | ICD-10-CM | POA: Diagnosis not present

## 2019-08-18 DIAGNOSIS — I2510 Atherosclerotic heart disease of native coronary artery without angina pectoris: Secondary | ICD-10-CM | POA: Diagnosis not present

## 2019-08-18 DIAGNOSIS — C7951 Secondary malignant neoplasm of bone: Secondary | ICD-10-CM | POA: Insufficient documentation

## 2019-08-18 DIAGNOSIS — Z87891 Personal history of nicotine dependence: Secondary | ICD-10-CM | POA: Insufficient documentation

## 2019-08-18 DIAGNOSIS — I700 Atherosclerosis of aorta: Secondary | ICD-10-CM | POA: Diagnosis not present

## 2019-08-18 DIAGNOSIS — I5032 Chronic diastolic (congestive) heart failure: Secondary | ICD-10-CM | POA: Diagnosis not present

## 2019-08-18 DIAGNOSIS — N35912 Unspecified bulbous urethral stricture, male: Secondary | ICD-10-CM | POA: Insufficient documentation

## 2019-08-18 DIAGNOSIS — L120 Bullous pemphigoid: Secondary | ICD-10-CM | POA: Diagnosis not present

## 2019-08-18 DIAGNOSIS — I110 Hypertensive heart disease with heart failure: Secondary | ICD-10-CM | POA: Insufficient documentation

## 2019-08-18 DIAGNOSIS — C434 Malignant melanoma of scalp and neck: Secondary | ICD-10-CM | POA: Diagnosis not present

## 2019-08-18 DIAGNOSIS — Z955 Presence of coronary angioplasty implant and graft: Secondary | ICD-10-CM | POA: Insufficient documentation

## 2019-08-18 DIAGNOSIS — Z7982 Long term (current) use of aspirin: Secondary | ICD-10-CM | POA: Insufficient documentation

## 2019-08-18 HISTORY — PX: CYSTOSCOPY WITH URETHRAL DILATATION: SHX5125

## 2019-08-18 SURGERY — CYSTOSCOPY, WITH URETHRAL DILATION
Anesthesia: General

## 2019-08-18 MED ORDER — LIDOCAINE 2% (20 MG/ML) 5 ML SYRINGE
INTRAMUSCULAR | Status: AC
  Filled 2019-08-18: qty 5

## 2019-08-18 MED ORDER — CEFAZOLIN SODIUM-DEXTROSE 2-4 GM/100ML-% IV SOLN
2.0000 g | Freq: Once | INTRAVENOUS | Status: AC
  Administered 2019-08-18: 14:00:00 2 g via INTRAVENOUS
  Filled 2019-08-18: qty 100

## 2019-08-18 MED ORDER — BELLADONNA ALKALOIDS-OPIUM 16.2-30 MG RE SUPP
RECTAL | Status: AC
  Filled 2019-08-18: qty 1

## 2019-08-18 MED ORDER — SODIUM CHLORIDE 0.9 % IR SOLN
Status: DC | PRN
  Administered 2019-08-18: 1000 mL
  Administered 2019-08-18: 3000 mL

## 2019-08-18 MED ORDER — PROPOFOL 10 MG/ML IV BOLUS
INTRAVENOUS | Status: DC | PRN
  Administered 2019-08-18: 100 mg via INTRAVENOUS

## 2019-08-18 MED ORDER — EPHEDRINE SULFATE-NACL 50-0.9 MG/10ML-% IV SOSY
PREFILLED_SYRINGE | INTRAVENOUS | Status: DC | PRN
  Administered 2019-08-18: 15 mg via INTRAVENOUS
  Administered 2019-08-18 (×3): 10 mg via INTRAVENOUS

## 2019-08-18 MED ORDER — DEXAMETHASONE SODIUM PHOSPHATE 10 MG/ML IJ SOLN
INTRAMUSCULAR | Status: DC | PRN
  Administered 2019-08-18: 4 mg via INTRAVENOUS

## 2019-08-18 MED ORDER — ONDANSETRON HCL 4 MG/2ML IJ SOLN
INTRAMUSCULAR | Status: DC | PRN
  Administered 2019-08-18: 4 mg via INTRAVENOUS

## 2019-08-18 MED ORDER — IOHEXOL 300 MG/ML  SOLN
INTRAMUSCULAR | Status: DC | PRN
  Administered 2019-08-18: 14:00:00 46 mL via URETHRAL

## 2019-08-18 MED ORDER — LACTATED RINGERS IV SOLN
INTRAVENOUS | Status: DC
  Administered 2019-08-18: 1000 mL via INTRAVENOUS
  Administered 2019-08-18: 12:00:00 via INTRAVENOUS

## 2019-08-18 MED ORDER — SODIUM CHLORIDE 0.9% FLUSH: 5.0000 mL | Freq: Three times a day (TID) | INTRAVENOUS | Status: DC

## 2019-08-18 MED ORDER — ONDANSETRON HCL 4 MG/2ML IJ SOLN: 4.0000 mg | Freq: Once | INTRAMUSCULAR | Status: DC | PRN

## 2019-08-18 MED ORDER — EPHEDRINE 5 MG/ML INJ
INTRAVENOUS | Status: AC
  Filled 2019-08-18: qty 10

## 2019-08-18 MED ORDER — PROPOFOL 10 MG/ML IV BOLUS
INTRAVENOUS | Status: AC
  Filled 2019-08-18: qty 20

## 2019-08-18 MED ORDER — CEPHALEXIN 500 MG PO CAPS: 500.0000 mg | ORAL_CAPSULE | Freq: Four times a day (QID) | ORAL | 0 refills | Status: DC

## 2019-08-18 MED ORDER — ONDANSETRON HCL 4 MG/2ML IJ SOLN
INTRAMUSCULAR | Status: AC
  Filled 2019-08-18: qty 2

## 2019-08-18 MED ORDER — PHENYLEPHRINE HCL (PRESSORS) 10 MG/ML IV SOLN
INTRAVENOUS | Status: DC | PRN
  Administered 2019-08-18: 80 ug via INTRAVENOUS

## 2019-08-18 MED ORDER — LIDOCAINE HCL URETHRAL/MUCOSAL 2 % EX GEL
CUTANEOUS | Status: AC
  Filled 2019-08-18: qty 5

## 2019-08-18 MED ORDER — SODIUM CHLORIDE 0.9 % IR SOLN
Status: DC | PRN
  Administered 2019-08-18: 3000 mL

## 2019-08-18 MED ORDER — FENTANYL CITRATE (PF) 100 MCG/2ML IJ SOLN
INTRAMUSCULAR | Status: AC
  Filled 2019-08-18: qty 2

## 2019-08-18 MED ORDER — ACETAMINOPHEN 500 MG PO TABS
1000.0000 mg | ORAL_TABLET | Freq: Once | ORAL | Status: AC
  Administered 2019-08-18: 1000 mg via ORAL
  Filled 2019-08-18: qty 2

## 2019-08-18 MED ORDER — FENTANYL CITRATE (PF) 100 MCG/2ML IJ SOLN
25.0000 ug | INTRAMUSCULAR | Status: DC | PRN
  Administered 2019-08-18: 16:00:00 50 ug via INTRAVENOUS

## 2019-08-18 MED ORDER — PHENYLEPHRINE 40 MCG/ML (10ML) SYRINGE FOR IV PUSH (FOR BLOOD PRESSURE SUPPORT)
PREFILLED_SYRINGE | INTRAVENOUS | Status: DC | PRN
  Administered 2019-08-18: 40 ug via INTRAVENOUS
  Administered 2019-08-18 (×4): 80 ug via INTRAVENOUS

## 2019-08-18 MED ORDER — OXYCODONE HCL 5 MG PO TABS: 5.0000 mg | ORAL_TABLET | Freq: Four times a day (QID) | ORAL | 0 refills | Status: DC | PRN

## 2019-08-18 MED ORDER — FENTANYL CITRATE (PF) 100 MCG/2ML IJ SOLN
INTRAMUSCULAR | Status: DC | PRN
  Administered 2019-08-18 (×4): 25 ug via INTRAVENOUS

## 2019-08-18 MED ORDER — DEXAMETHASONE SODIUM PHOSPHATE 10 MG/ML IJ SOLN
INTRAMUSCULAR | Status: AC
  Filled 2019-08-18: qty 1

## 2019-08-18 MED ORDER — LIDOCAINE 2% (20 MG/ML) 5 ML SYRINGE
INTRAMUSCULAR | Status: DC | PRN
  Administered 2019-08-18: 40 mg via INTRAVENOUS

## 2019-08-18 MED ORDER — PHENYLEPHRINE 40 MCG/ML (10ML) SYRINGE FOR IV PUSH (FOR BLOOD PRESSURE SUPPORT)
PREFILLED_SYRINGE | INTRAVENOUS | Status: AC
  Filled 2019-08-18: qty 10

## 2019-08-18 SURGICAL SUPPLY — 34 items
BAG URINE DRAINAGE (UROLOGICAL SUPPLIES) ×2 IMPLANT
BALLN NEPHROSTOMY (BALLOONS)
BALLOON NEPHROSTOMY (BALLOONS) IMPLANT
CATH COUNCIL 22FR (CATHETERS) ×1 IMPLANT
CATH FOLEY 2W COUNCIL 20FR 5CC (CATHETERS) IMPLANT
CATH FOLEY 2WAY  3CC  8FR (CATHETERS) ×1
CATH FOLEY 2WAY 3CC 8FR (CATHETERS) IMPLANT
CATH FOLEY 2WAY SLVR  5CC 14FR (CATHETERS) ×1
CATH FOLEY 2WAY SLVR 5CC 14FR (CATHETERS) IMPLANT
CATH INTERMIT  6FR 70CM (CATHETERS) ×1 IMPLANT
CATH URET DUAL LUMEN 6-10FR 50 (CATHETERS) ×1 IMPLANT
CLOTH BEACON ORANGE TIMEOUT ST (SAFETY) ×2 IMPLANT
COVER WAND RF STERILE (DRAPES) IMPLANT
FIBER LASER FLEXIVA 550 (UROLOGICAL SUPPLIES) ×1 IMPLANT
GAUZE SPONGE 2X2 8PLY STRL LF (GAUZE/BANDAGES/DRESSINGS) IMPLANT
GLOVE BIO SURGEON STRL SZ7.5 (GLOVE) ×2 IMPLANT
GOWN STRL REUS W/TWL XL LVL3 (GOWN DISPOSABLE) ×2 IMPLANT
GUIDEWIRE ANG ZIPWIRE 038X150 (WIRE) ×1 IMPLANT
GUIDEWIRE STR DUAL SENSOR (WIRE) ×2 IMPLANT
HOLDER FOLEY CATH W/STRAP (MISCELLANEOUS) ×1 IMPLANT
KIT BALLN UROMAX 15FX4 (MISCELLANEOUS) IMPLANT
KIT BALLN UROMAX 26 75X4 (MISCELLANEOUS) ×1
KIT TURNOVER KIT A (KITS) IMPLANT
MANIFOLD NEPTUNE II (INSTRUMENTS) ×1 IMPLANT
NS IRRIG 1000ML POUR BTL (IV SOLUTION) IMPLANT
PACK CYSTO (CUSTOM PROCEDURE TRAY) ×2 IMPLANT
PLUG CATH AND CAP STER (CATHETERS) ×1 IMPLANT
SET AMPLATZ RENAL DILATOR (MISCELLANEOUS) ×1 IMPLANT
SPONGE GAUZE 2X2 STER 10/PKG (GAUZE/BANDAGES/DRESSINGS) ×1
SYR 50ML LL SCALE MARK (SYRINGE) ×1 IMPLANT
SYRINGE IRR TOOMEY STRL 70CC (SYRINGE) ×1 IMPLANT
TAPE CLOTH SURG 4X10 WHT LF (GAUZE/BANDAGES/DRESSINGS) ×1 IMPLANT
TUBING UROLOGY SET (TUBING) ×1 IMPLANT
WATER STERILE IRR 3000ML UROMA (IV SOLUTION) ×2 IMPLANT

## 2019-08-18 NOTE — Op Note (Addendum)
Preoperative diagnosis: 1. Urethral stricture  Postoperative diagnosis:  1. Urethral stricture  Procedure:  1. Retrograde urethrogram, Antegrade cystogram 2. Retrograde rigid cystourethroscopy, Antegrade flexible cysturethroscopy, Dilation of suprapubic tract 3. Laser incision of urethral stricture, Balloon dilation of urethral stricture, Dilation of urethral stricture 4. Suprapubic catheter exchange, Urethral Foley catheter placement  Surgeon: Eda Keys MD Assistant: Kerrie Pleasure MD  Anesthesia: General  Complications: None  EBL: Minimal  Specimens: None  Indication: Corey Oneill is a patient with urethral stricture and urinary retention; see prior urology notes for details. After reviewing the management options for treatment, he elected to proceed with the above surgical procedure(s). We have discussed the potential benefits and risks of the procedure, side effects of the proposed treatment, the likelihood of the patient achieving the goals of the procedure, and any potential problems that might occur during the procedure or recuperation. Informed consent has been obtained.  Description of procedure:  The patient was taken to the operating room and general anesthesia was induced.  The patient was placed in the dorsal lithotomy position, prepped and draped in the usual sterile fashion, and preoperative antibiotics were administered. A preoperative time-out was performed.   We started by performing a retrograde urethrogram through an 8 French Foley catheter.  This demonstrated contrast filling to a focal termination point at the level of the membranous urethra.  No contrast was able to pass proximal to this point. We then performed a cystogram in antegrade fashion through the prior indwelling suprapubic tube.  This similarly demonstrated contrast filling through the prostatic urethra to a focal termination point at the level of the membranous urethra.  No contrast was able to  pass distally from this point.  With this information, we determined that we had a short segment membranous urethral stricture.  We then performed retrograde cystourethroscopy using a rigid cystoscope.  This demonstrated a normal urethra until complete obliteration of the lumen at the level of the membranous urethra.  We thus decided to further evaluate urethra in antegrade fashion.  The prior indwelling 12 French suprapubic catheter was cannulated with a zip wire, and was then removed leaving the wire in place.  With the assistance of a dual-lumen catheter, a sensor wire was placed.  Thus, we had an access wire and a working wire in place.  We kept the zip wire as an access wire.  We then began serial dilation of the suprapubic tract to 24 Fr; this allowed [place] to pass a 24 French sheath through the suprapubic tract (amplatz set).  Next, we performed antegrade cystourethroscopy using a flexible cystoscope.  Once again we encountered complete obliteration of the urethral lumen at the level of the membranous urethra.  The prostatic urethra was friable and granulated but the bladder neck and prostatic urethra were grossly patent as had been seen on our antegrade cystogram.  Utilizing concurrent antegrade and retrograde cystourethroscopy with  Two cystoscopes, we once again confirmed a short segment complete obliteration of the membranous urethra. With The light off on the antegrade rigid cystoscope we could see the light from the flexible cystoscope coming retrograde.  The like guided where we performed a laser incision. We therefore obtained a holmium laser and performed laser incision of urethral stricture through the rigid cystoscope until proximal urethral lumen was visualized  At a setting of 1 and 20.  We then passed a wire through this new opening in the membranous urethra.  Next, we performed balloon dilation of this focal stricture to 18  fr to get it started and it dilated easily. Therefore we  followed  by serial dilation up to 24 [place] over a wire with the antegrade flexible cystoscope directly visualizing passage of the dilators.  We then passed a 22 Fr council catheter over the wire which was again confirmed to arrive within the bladder with direct visualization via antegrade cystoscopy.  Foley catheter balloon was filled with 10 cc still under direct visualization via antegrade cystoscope.  We then removed the suprapubic tract sheath and the antegrade flexible cystoscope.  We irrigated bladder to clear.  Due to minor bleeding from serial dilation of suprapubic tract, we elected to place a 14 French catheter into the suprapubic tract primarily to aid in hemostasis.  10 cc was placed into the suprapubic catheter balloon once it was placed within bladder.  This catheter was irrigated and irrigated well as well.  A suprapubic catheter Was placed in the suprapubic Foley.  The urethral Foley catheter was left to drainage and connected to drainage bag.  The patient appeared to tolerate the procedure well and without complications.  The patient was able to be awakened and transferred to the recovery unit in satisfactory condition.   Postop plan: Discharge to home when meets PACU criteria. Urethral Foley catheter to remain to drainage and suprapubic catheter to remain capped at discharge. Patient will follow-up later this week for removal of catheters in office.

## 2019-08-18 NOTE — Anesthesia Procedure Notes (Signed)
Procedure Name: LMA Insertion Date/Time: 08/18/2019 1:30 PM Performed by: Eben Burow, CRNA Pre-anesthesia Checklist: Patient identified, Emergency Drugs available, Suction available, Patient being monitored and Timeout performed Patient Re-evaluated:Patient Re-evaluated prior to induction Oxygen Delivery Method: Circle system utilized Preoxygenation: Pre-oxygenation with 100% oxygen Induction Type: IV induction Ventilation: Mask ventilation without difficulty LMA: LMA inserted LMA Size: 4.0 Tube secured with: Tape Dental Injury: Teeth and Oropharynx as per pre-operative assessment

## 2019-08-18 NOTE — Discharge Instructions (Signed)
1. You may see some blood in the urine for 48-72 hours. This is okay as long as the catheter is draining. Please call if the urethral catheter is not draining. You can leave the suprapubic catheter capped. The catheters will be removed at your follow up visit.  2. You should call should you develop an inability urinate, fever > 101, persistent nausea and vomiting that prevents you from eating or drinking to stay hydrated.  3. If you have a catheter, you will be taught how to take care of the catheter by the nursing staff prior to discharge from the hospital.  You may periodically feel a strong urge to void with the catheter in place.  This is a bladder spasm and most often can occur when having a bowel movement or moving around. It is typically self-limited and usually will stop after a few minutes.  You may use some Vaseline or Neosporin around the tip of the catheter to reduce friction at the tip of the penis. You may also see some blood in the urine.  A very small amount of blood can make the urine look quite red.  As long as the catheter is draining well, there usually is not a problem.  However, if the catheter is not draining well and is bloody, you should call the office [phone]) to notify us.

## 2019-08-18 NOTE — Interval H&P Note (Signed)
History and Physical Interval Note:  08/18/2019 11:45 AM  Corey Oneill  has presented today for surgery, with the diagnosis of URETERAL STRICTURE.  The various methods of treatment have been discussed with the patient and family. After consideration of risks, benefits and other options for treatment, the patient has consented to  Procedure(s): [place] (N/A) as a surgical intervention.  The patient's history has been reviewed, patient examined, no change in status, stable for surgery. He's been well and had no fever or bladder pain. Discussed again we may fail to gain access across the stricture and he may need to keep the SP tube until we get him to a recon specialists. Also discussed the nature of strictures and how this is likely to recur. Patient reports gross hematuria has resolved.   I have reviewed the patient's chart and labs.  Questions were answered to the patient's satisfaction. He elects to proceed.      Corey Oneill

## 2019-08-18 NOTE — Transfer of Care (Signed)
Immediate Anesthesia Transfer of Care Note  Patient: Corey Oneill  Procedure(s) Performed: CYSTOSCOPY WITH URETHRAL DILATATION USING BALLOON OR LASER/ SPURO PUBIC TUBE CHANGE LASER EXCISION OF URETHRAL STRICTURE (N/A )  Patient Location: PACU  Anesthesia Type:General  Level of Consciousness: drowsy  Airway & Oxygen Therapy: Patient Spontanous Breathing and Patient connected to face mask oxygen  Post-op Assessment: Report given to RN and Post -op Vital signs reviewed and stable  Post vital signs: Reviewed and stable  Last Vitals:  Vitals Value Taken Time  BP 136/83 08/18/19 1508  Temp    Pulse 82 08/18/19 1510  Resp 9 08/18/19 1510  SpO2 100 % 08/18/19 1510  Vitals shown include unvalidated device data.  Last Pain:  Vitals:   08/18/19 1109  PainSc: 0-No pain      Patients Stated Pain Goal: (unable) [id number])  Complications: No apparent anesthesia complications

## 2019-08-18 NOTE — Anesthesia Postprocedure Evaluation (Signed)
Anesthesia Post Note  Patient: Corey Oneill  Procedure(s) Performed: CYSTOSCOPY WITH URETHRAL DILATATION USING BALLOON OR LASER/ SPURO PUBIC TUBE CHANGE LASER EXCISION OF URETHRAL STRICTURE RETROGRADE URETHROGRAM ANTEGRADE CYSTOGRAM (N/A )     Patient location during evaluation: PACU Anesthesia Type: General Level of consciousness: awake and alert, oriented and patient cooperative Pain management: pain level controlled Vital Signs Assessment: post-procedure vital signs reviewed and stable Respiratory status: spontaneous breathing, nonlabored ventilation and respiratory function stable Cardiovascular status: blood pressure returned to baseline and stable Postop Assessment: no apparent nausea or vomiting Anesthetic complications: no    Last Vitals:  Vitals:   08/18/19 1109 08/18/19 1515  BP: 115/71   Pulse: 76   Resp: 18   Temp: 36.7 C 36.6 C  SpO2: 99%     Last Pain:  Vitals:   08/18/19 1515  PainSc: [place]

## 2019-08-19 DIAGNOSIS — N35012 Post-traumatic membranous urethral stricture: Secondary | ICD-10-CM | POA: Diagnosis not present

## 2019-08-20 ENCOUNTER — Encounter (HOSPITAL_COMMUNITY): Payer: Self-pay | Admitting: Urology

## 2019-08-20 ENCOUNTER — Telehealth: Payer: Self-pay | Admitting: Oncology

## 2019-08-20 NOTE — Telephone Encounter (Signed)
Scheduled per sch msg. Called and left msg. Mailed printout  

## 2019-08-21 ENCOUNTER — Emergency Department (HOSPITAL_COMMUNITY): Payer: Medicare Other

## 2019-08-21 ENCOUNTER — Other Ambulatory Visit: Payer: Self-pay

## 2019-08-21 ENCOUNTER — Encounter (HOSPITAL_COMMUNITY): Payer: Self-pay

## 2019-08-21 ENCOUNTER — Inpatient Hospital Stay (HOSPITAL_COMMUNITY)
Admission: EM | Admit: 2019-08-21 | Discharge: 2019-08-27 | DRG: 872 | Disposition: A | Discharge status: Home Health | Payer: Medicare Other | Attending: Internal Medicine | Admitting: Internal Medicine

## 2019-08-21 DIAGNOSIS — I700 Atherosclerosis of aorta: Secondary | ICD-10-CM | POA: Diagnosis present

## 2019-08-21 DIAGNOSIS — Z79891 Long term (current) use of opiate analgesic: Secondary | ICD-10-CM

## 2019-08-21 DIAGNOSIS — R0689 Other abnormalities of breathing: Secondary | ICD-10-CM | POA: Diagnosis not present

## 2019-08-21 DIAGNOSIS — Z974 Presence of external hearing-aid: Secondary | ICD-10-CM

## 2019-08-21 DIAGNOSIS — R351 Nocturia: Secondary | ICD-10-CM | POA: Diagnosis present

## 2019-08-21 DIAGNOSIS — B001 Herpesviral vesicular dermatitis: Secondary | ICD-10-CM | POA: Diagnosis present

## 2019-08-21 DIAGNOSIS — Z538 Procedure and treatment not carried out for other reasons: Secondary | ICD-10-CM | POA: Diagnosis present

## 2019-08-21 DIAGNOSIS — A4181 Sepsis due to Enterococcus: Principal | ICD-10-CM | POA: Diagnosis present

## 2019-08-21 DIAGNOSIS — Z818 Family history of other mental and behavioral disorders: Secondary | ICD-10-CM

## 2019-08-21 DIAGNOSIS — Z952 Presence of prosthetic heart valve: Secondary | ICD-10-CM | POA: Diagnosis not present

## 2019-08-21 DIAGNOSIS — E785 Hyperlipidemia, unspecified: Secondary | ICD-10-CM | POA: Diagnosis present

## 2019-08-21 DIAGNOSIS — Z923 Personal history of irradiation: Secondary | ICD-10-CM | POA: Diagnosis not present

## 2019-08-21 DIAGNOSIS — N35912 Unspecified bulbous urethral stricture, male: Secondary | ICD-10-CM | POA: Diagnosis present

## 2019-08-21 DIAGNOSIS — R4182 Altered mental status, unspecified: Secondary | ICD-10-CM | POA: Diagnosis not present

## 2019-08-21 DIAGNOSIS — L120 Bullous pemphigoid: Secondary | ICD-10-CM | POA: Diagnosis not present

## 2019-08-21 DIAGNOSIS — I5032 Chronic diastolic (congestive) heart failure: Secondary | ICD-10-CM | POA: Diagnosis present

## 2019-08-21 DIAGNOSIS — D72819 Decreased white blood cell count, unspecified: Secondary | ICD-10-CM | POA: Diagnosis not present

## 2019-08-21 DIAGNOSIS — C7951 Secondary malignant neoplasm of bone: Secondary | ICD-10-CM | POA: Diagnosis not present

## 2019-08-21 DIAGNOSIS — N390 Urinary tract infection, site not specified: Secondary | ICD-10-CM | POA: Diagnosis not present

## 2019-08-21 DIAGNOSIS — Z20828 Contact with and (suspected) exposure to other viral communicable diseases: Secondary | ICD-10-CM | POA: Diagnosis not present

## 2019-08-21 DIAGNOSIS — D630 Anemia in neoplastic disease: Secondary | ICD-10-CM | POA: Diagnosis present

## 2019-08-21 DIAGNOSIS — K219 Gastro-esophageal reflux disease without esophagitis: Secondary | ICD-10-CM | POA: Diagnosis not present

## 2019-08-21 DIAGNOSIS — D7589 Other specified diseases of blood and blood-forming organs: Secondary | ICD-10-CM | POA: Diagnosis present

## 2019-08-21 DIAGNOSIS — Z7982 Long term (current) use of aspirin: Secondary | ICD-10-CM

## 2019-08-21 DIAGNOSIS — B952 Enterococcus as the cause of diseases classified elsewhere: Secondary | ICD-10-CM | POA: Diagnosis not present

## 2019-08-21 DIAGNOSIS — A419 Sepsis, unspecified organism: Secondary | ICD-10-CM

## 2019-08-21 DIAGNOSIS — Z87891 Personal history of nicotine dependence: Secondary | ICD-10-CM

## 2019-08-21 DIAGNOSIS — Z955 Presence of coronary angioplasty implant and graft: Secondary | ICD-10-CM

## 2019-08-21 DIAGNOSIS — D696 Thrombocytopenia, unspecified: Secondary | ICD-10-CM | POA: Diagnosis not present

## 2019-08-21 DIAGNOSIS — Z87442 Personal history of urinary calculi: Secondary | ICD-10-CM | POA: Diagnosis not present

## 2019-08-21 DIAGNOSIS — Z86006 Personal history of melanoma in-situ: Secondary | ICD-10-CM

## 2019-08-21 DIAGNOSIS — R509 Fever, unspecified: Secondary | ICD-10-CM | POA: Diagnosis not present

## 2019-08-21 DIAGNOSIS — R05 Cough: Secondary | ICD-10-CM | POA: Diagnosis not present

## 2019-08-21 DIAGNOSIS — R000 Tachycardia, unspecified: Secondary | ICD-10-CM | POA: Diagnosis not present

## 2019-08-21 DIAGNOSIS — E876 Hypokalemia: Secondary | ICD-10-CM | POA: Diagnosis not present

## 2019-08-21 DIAGNOSIS — Z79899 Other long term (current) drug therapy: Secondary | ICD-10-CM

## 2019-08-21 DIAGNOSIS — I2510 Atherosclerotic heart disease of native coronary artery without angina pectoris: Secondary | ICD-10-CM | POA: Diagnosis present

## 2019-08-21 DIAGNOSIS — F329 Major depressive disorder, single episode, unspecified: Secondary | ICD-10-CM | POA: Diagnosis present

## 2019-08-21 DIAGNOSIS — I350 Nonrheumatic aortic (valve) stenosis: Secondary | ICD-10-CM | POA: Diagnosis not present

## 2019-08-21 DIAGNOSIS — C61 Malignant neoplasm of prostate: Secondary | ICD-10-CM | POA: Diagnosis not present

## 2019-08-21 DIAGNOSIS — R404 Transient alteration of awareness: Secondary | ICD-10-CM | POA: Diagnosis not present

## 2019-08-21 DIAGNOSIS — N3941 Urge incontinence: Secondary | ICD-10-CM | POA: Diagnosis present

## 2019-08-21 DIAGNOSIS — R0902 Hypoxemia: Secondary | ICD-10-CM | POA: Diagnosis not present

## 2019-08-21 DIAGNOSIS — D693 Immune thrombocytopenic purpura: Secondary | ICD-10-CM | POA: Diagnosis present

## 2019-08-21 DIAGNOSIS — I38 Endocarditis, valve unspecified: Secondary | ICD-10-CM | POA: Diagnosis not present

## 2019-08-21 DIAGNOSIS — I4891 Unspecified atrial fibrillation: Secondary | ICD-10-CM | POA: Diagnosis not present

## 2019-08-21 DIAGNOSIS — Z862 Personal history of diseases of the blood and blood-forming organs and certain disorders involving the immune mechanism: Secondary | ICD-10-CM

## 2019-08-21 DIAGNOSIS — I110 Hypertensive heart disease with heart failure: Secondary | ICD-10-CM | POA: Diagnosis present

## 2019-08-21 DIAGNOSIS — R7881 Bacteremia: Secondary | ICD-10-CM | POA: Diagnosis not present

## 2019-08-21 LAB — COMPREHENSIVE METABOLIC PANEL
ALT: 11 U/L (ref 0–44)
AST: 21 U/L (ref 15–41)
Albumin: 3.5 g/dL (ref 3.5–5.0)
Alkaline Phosphatase: 59 U/L (ref 38–126)
Anion gap: 12 (ref 5–15)
BUN: 16 mg/dL (ref 8–23)
CO2: 22 mmol/L (ref 22–32)
Calcium: 7.6 mg/dL — ABNORMAL LOW (ref 8.9–10.3)
Chloride: 101 mmol/L (ref 98–111)
Creatinine, Ser: 0.78 mg/dL (ref 0.61–1.24)
GFR calc Af Amer: >60 mL/min (ref >60)
GFR calc non Af Amer: >60 mL/min (ref >60)
Glucose, Bld: 164 mg/dL — ABNORMAL HIGH (ref 70–99)
Potassium: 3 mmol/L — ABNORMAL LOW (ref 3.5–5.1)
Sodium: 135 mmol/L (ref 135–145)
Total Bilirubin: 0.8 mg/dL (ref 0.3–1.2)
Total Protein: 6.2 g/dL — ABNORMAL LOW (ref 6.5–8.1)

## 2019-08-21 LAB — CBC WITH DIFFERENTIAL/PLATELET
Abs Immature Granulocytes: 0.06 10*3/uL (ref 0.00–0.07)
Basophils Absolute: 0 10*3/uL (ref 0.0–0.1)
Basophils Relative: 0 %
Eosinophils Absolute: 0 10*3/uL (ref 0.0–0.5)
Eosinophils Relative: 0 %
HCT: 30 % — ABNORMAL LOW (ref 39.0–52.0)
Hemoglobin: 10 g/dL — ABNORMAL LOW (ref 13.0–17.0)
Immature Granulocytes: 1 %
Lymphocytes Relative: 2 %
Lymphs Abs: 0.3 10*3/uL — ABNORMAL LOW (ref 0.7–4.0)
MCH: 34 pg (ref 26.0–34.0)
MCHC: 33.3 g/dL (ref 30.0–36.0)
MCV: 102 fL — ABNORMAL HIGH (ref 80.0–100.0)
Monocytes Absolute: 0.4 10*3/uL (ref 0.1–1.0)
Monocytes Relative: 3 %
Neutro Abs: 9.6 10*3/uL — ABNORMAL HIGH (ref 1.7–7.7)
Neutrophils Relative %: 94 %
Platelets: 83 10*3/uL — ABNORMAL LOW (ref 150–400)
RBC: 2.94 MIL/uL — ABNORMAL LOW (ref 4.22–5.81)
RDW: 12.6 % (ref 11.5–15.5)
WBC: 10.3 10*3/uL (ref 4.0–10.5)
nRBC: 0 % (ref 0.0–0.2)

## 2019-08-21 LAB — URINALYSIS, ROUTINE W REFLEX MICROSCOPIC
Bilirubin Urine: NEGATIVE
Glucose, UA: NEGATIVE mg/dL
Ketones, ur: 20 mg/dL — AB
Nitrite: NEGATIVE
Protein, ur: 100 mg/dL — AB
RBC / HPF: >50 RBC/hpf — ABNORMAL HIGH (ref 0–5)
Specific Gravity, Urine: 1.019 (ref 1.005–1.030)
pH: 6 (ref 5.0–8.0)

## 2019-08-21 LAB — SARS CORONAVIRUS 2 BY RT PCR (HOSPITAL ORDER, PERFORMED IN ~~LOC~~ HOSPITAL LAB): SARS Coronavirus 2: NEGATIVE

## 2019-08-21 LAB — LACTIC ACID, PLASMA: Lactic Acid, Venous: 1.8 mmol/L (ref 0.5–1.9)

## 2019-08-21 MED ORDER — POTASSIUM CHLORIDE CRYS ER 20 MEQ PO TBCR
40.0000 meq | EXTENDED_RELEASE_TABLET | Freq: Once | ORAL | Status: AC
  Administered 2019-08-21: 40 meq via ORAL
  Filled 2019-08-21: qty 2

## 2019-08-21 MED ORDER — ACETAMINOPHEN 650 MG RE SUPP: 650.0000 mg | Freq: Four times a day (QID) | RECTAL | Status: DC | PRN

## 2019-08-21 MED ORDER — SODIUM CHLORIDE 0.9 % IV SOLN
2.0000 g | Freq: Once | INTRAVENOUS | Status: AC
  Administered 2019-08-21: 2 g via INTRAVENOUS
  Filled 2019-08-21: qty 2

## 2019-08-21 MED ORDER — TAMSULOSIN HCL 0.4 MG PO CAPS
0.4000 mg | ORAL_CAPSULE | Freq: Every day | ORAL | Status: DC
  Administered 2019-08-21 – 2019-08-27 (×7): 0.4 mg via ORAL
  Filled 2019-08-21 (×7): qty 1

## 2019-08-21 MED ORDER — CARVEDILOL 3.125 MG PO TABS
3.1250 mg | ORAL_TABLET | Freq: Two times a day (BID) | ORAL | Status: DC
  Administered 2019-08-21 – 2019-08-27 (×13): 3.125 mg via ORAL
  Filled 2019-08-21 (×13): qty 1

## 2019-08-21 MED ORDER — ACETAMINOPHEN 325 MG PO TABS: 650.0000 mg | ORAL_TABLET | Freq: Four times a day (QID) | ORAL | Status: DC | PRN

## 2019-08-21 MED ORDER — ISOSORBIDE MONONITRATE ER 30 MG PO TB24
30.0000 mg | ORAL_TABLET | Freq: Every day | ORAL | Status: DC
  Administered 2019-08-21 – 2019-08-27 (×7): 30 mg via ORAL
  Filled 2019-08-21 (×7): qty 1

## 2019-08-21 MED ORDER — SODIUM CHLORIDE 0.9 % IV BOLUS
1000.0000 mL | Freq: Once | INTRAVENOUS | Status: AC
  Administered 2019-08-21: 1000 mL via INTRAVENOUS

## 2019-08-21 MED ORDER — HYDROCODONE-ACETAMINOPHEN 5-325 MG PO TABS: 1.0000 | ORAL_TABLET | ORAL | Status: DC | PRN

## 2019-08-21 MED ORDER — ENOXAPARIN SODIUM 40 MG/0.4ML ~~LOC~~ SOLN: 40.0000 mg | SUBCUTANEOUS | Status: DC

## 2019-08-21 MED ORDER — ROSUVASTATIN CALCIUM 10 MG PO TABS
10.0000 mg | ORAL_TABLET | Freq: Every day | ORAL | Status: DC
  Administered 2019-08-21 – 2019-08-27 (×7): 10 mg via ORAL
  Filled 2019-08-21 (×7): qty 1

## 2019-08-21 MED ORDER — FAMOTIDINE 20 MG PO TABS
20.0000 mg | ORAL_TABLET | Freq: Once | ORAL | Status: AC
  Administered 2019-08-21: 20 mg via ORAL
  Filled 2019-08-21: qty 1

## 2019-08-21 MED ORDER — CHLORHEXIDINE GLUCONATE CLOTH 2 % EX PADS
6.0000 | MEDICATED_PAD | Freq: Every day | CUTANEOUS | Status: DC
  Administered 2019-08-21 – 2019-08-27 (×6): 6 via TOPICAL

## 2019-08-21 MED ORDER — DOCUSATE SODIUM 100 MG PO CAPS
100.0000 mg | ORAL_CAPSULE | Freq: Two times a day (BID) | ORAL | Status: DC
  Administered 2019-08-22 – 2019-08-27 (×6): 100 mg via ORAL
  Filled 2019-08-21 (×9): qty 1

## 2019-08-21 MED ORDER — IBUPROFEN 200 MG PO TABS
400.0000 mg | ORAL_TABLET | Freq: Once | ORAL | Status: AC
  Administered 2019-08-21: 400 mg via ORAL
  Filled 2019-08-21: qty 2

## 2019-08-21 MED ORDER — SODIUM CHLORIDE 0.9 % IV SOLN
2.0000 g | Freq: Three times a day (TID) | INTRAVENOUS | Status: DC
  Administered 2019-08-21 – 2019-08-22 (×2): 2 g via INTRAVENOUS
  Filled 2019-08-21 (×2): qty 2

## 2019-08-21 MED ORDER — ASPIRIN EC 81 MG PO TBEC
81.0000 mg | DELAYED_RELEASE_TABLET | Freq: Every day | ORAL | Status: DC
  Administered 2019-08-21 – 2019-08-26 (×6): 81 mg via ORAL
  Filled 2019-08-21 (×6): qty 1

## 2019-08-21 MED ORDER — ONDANSETRON HCL 4 MG PO TABS: 4.0000 mg | ORAL_TABLET | Freq: Four times a day (QID) | ORAL | Status: DC | PRN

## 2019-08-21 MED ORDER — LACTATED RINGERS IV BOLUS
500.0000 mL | Freq: Once | INTRAVENOUS | Status: AC
  Administered 2019-08-21: 500 mL via INTRAVENOUS

## 2019-08-21 MED ORDER — SODIUM CHLORIDE 0.9% FLUSH
3.0000 mL | Freq: Two times a day (BID) | INTRAVENOUS | Status: DC
  Administered 2019-08-21 – 2019-08-27 (×8): 3 mL via INTRAVENOUS

## 2019-08-21 MED ORDER — ENZALUTAMIDE 40 MG PO CAPS: 160.0000 mg | ORAL_CAPSULE | Freq: Every day | ORAL | Status: DC

## 2019-08-21 MED ORDER — ONDANSETRON HCL 4 MG/2ML IJ SOLN: 4.0000 mg | Freq: Four times a day (QID) | INTRAMUSCULAR | Status: DC | PRN

## 2019-08-21 MED ORDER — DULOXETINE HCL 30 MG PO CPEP
30.0000 mg | ORAL_CAPSULE | Freq: Every day | ORAL | Status: DC
  Administered 2019-08-21 – 2019-08-27 (×7): 30 mg via ORAL
  Filled 2019-08-21 (×7): qty 1

## 2019-08-21 MED ORDER — LACTATED RINGERS IV SOLN
INTRAVENOUS | Status: DC
  Administered 2019-08-21 – 2019-08-27 (×4): via INTRAVENOUS

## 2019-08-21 MED ORDER — SODIUM CHLORIDE 0.9 % IV SOLN: INTRAVENOUS | Status: DC

## 2019-08-21 MED ORDER — SODIUM CHLORIDE 0.9 % IV BOLUS: 500.0000 mL | Freq: Once | INTRAVENOUS | Status: DC

## 2019-08-21 NOTE — ED Provider Notes (Signed)
[facility] DEPT Provider Note   CSN: [id number] Arrival date & time: 08/21/19  [id number]     History   Chief Complaint Chief Complaint  Patient presents with  . Fever    HPI Corey Oneill is a 83 y.o. male w PMHx metastatic prostate cancer to bone, melanoma, presenting to the ED via EMS with complaint of feeling unwell. His sx began last night.  His wife, Cavin Trippe, states he took some of his medication last night before bed with a glass of wine.  She states he felt flushed and unwell and went to bed.  She states this morning he woke up however was much more fatigued than usual and would not get out of bed.  She states he has baseline dementia and was a little bit disoriented this morning.  He has been taking Keflex for this procedure, however thinks he may have missed 1 or 2 doses.  Patient denies cough, shortness of breath, abdominal pain, nausea, vomiting, or any other associated symptoms.  Chart review recent COVID test on 08/14/2019 was negative.   Patient just had cystoscopy with urethral dilation on 08/18/2019 by Dr. Junious Silk.  Not currently on any chemotherapy treatment for his prostate cancer or melanoma. He currently has a urethral catheter in place, the suprapubic catheter was removed. Pt was given 1g of tylenol and 250cc NS en route.    The history is provided by the patient, the spouse and medical records.    Past Medical History:  Diagnosis Date  . Abdominal aortic atherosclerosis ([place])   . Anginal pain ([place])    last noted 08/17/19  . Bone cancer ([place])    Left hip  . BPPV (benign paroxysmal positional vertigo)   . Bullous pemphigoid   . CAD (coronary artery disease)    a.  LHC 8/16: Mid to distal LAD 30%, OM1 40%, proximal mid RCA 40%, distal RCA 60% >> FFR 0.69  >> PCI: 3 x 15 mm Resolute DES  . Depression   . Esophageal reflux   . Family history of adverse reaction to anesthesia    "daughter has PONV"  . Grade I diastolic dysfunction  [id number]   Noted on ECHO   . Grade I diastolic dysfunction [id number]   Noted on ECHO  . H/O blood clots    "get them in my stool and urine" (11/12/2015)  . Heart murmur   . Hepatic lesion 12/08/2015   Stable 8 mm right hepatic lesion  . History of aortic stenosis    a. peak to peak gradient by LHC 8/16:  37 mmHg (moderately severe)   . History of appendicitis 2016  . History of blood transfusion 12/2018  . History of herpes labialis   . History of ITP    2018, thrombocytopenia  . History of kidney stones   . Hx of radiation therapy 04/14/13-06/09/13   prostate 7800 cGy, 40 sessions, seminal vesicles 5600 cGy 40 sessions  . Hydrocele 2006   Small  . Hyperlipidemia   . Insomnia    resolved  . LVH (left ventricular hypertrophy)    Moderate, noted on ECHO 09/2018  . 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 Melanoma ([place])    pt denies  . Metastasis from malignant neoplasm of prostate ([place]) 02/25/2019  . Prostate cancer ([place]) dx'd 2014  . Radiation proctitis   . Renal mass 2017   Bilateral renal masses  . S/P skin biopsy 04/09/2019  subepidermal cell poor vesicle , Linear IGG and C3 the basement membrane  . Suprapubic catheter ([place])   . Wears hearing aid in both ears     Patient Active Problem List   Diagnosis Date Noted  . Bullous pemphigoid 06/12/2019  . Metastasis from malignant neoplasm of prostate ([place]) 02/25/2019  . Radiation proctitis   . Hx of radiation therapy   . Heart murmur   . H/O blood clots   . Family history of adverse reaction to anesthesia   . CAD (coronary artery disease)   . Aortic stenosis   . Anginal pain ([place])   . History of ITP 02/26/2017  . 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 S/P appendectomy 11/12/2015  . Esophageal reflux 03/22/2015  . Depression 11/20/2014  . Prostate cancer ([place])  03/05/2013  . Erectile dysfunction   . Hyperlipidemia 06/07/2007    Past Surgical History:  Procedure Laterality Date  . CARDIAC CATHETERIZATION N/A 04/21/2015   Procedure: Right/Left Heart Cath and Coronary Angiography;  Surgeon: [name], MD;  Location: [facility];  Service: Cardiovascular;  Laterality: N/A;  . CARDIAC CATHETERIZATION N/A 06/18/2015   Procedure: Intravascular Pressure Wire/FFR Study;  Surgeon: [name], MD;  Location: [facility];  Service: Cardiovascular;  Laterality: N/A;  . CARDIAC CATHETERIZATION N/A 06/18/2015   Procedure: Coronary Stent Intervention;  Surgeon: [name], MD;  Location: [facility];  Service: Cardiovascular;  Laterality: N/A;  . CARDIAC CATHETERIZATION N/A 06/18/2015   Procedure: Right/Left Heart Cath and Coronary Angiography;  Surgeon: [name], MD;  Location: [facility];  Service: Cardiovascular;  Laterality: N/A;  . CARDIAC CATHETERIZATION N/A 06/23/2015   Procedure: Left Heart Cath and Cors/Grafts Angiography;  Surgeon: [name], MD;  Location: [facility];  Service: Cardiovascular;  Laterality: N/A;  . CARDIAC CATHETERIZATION N/A 01/17/2016   Procedure: Right/Left Heart Cath and Coronary Angiography;  Surgeon: [name], MD;  Location: [facility];  Service: Cardiovascular;  Laterality: N/A;  . CATARACT EXTRACTION, BILATERAL    . COLONOSCOPY  06/26/2017  . CYSTOSCOPY WITH BIOPSY N/A 07/30/2018   Procedure: CYSTOSCOPY WITH BIOPSY/FULGURATION, CYSTOLTHOLAPAXY;  Surgeon: [name], MD;  Location: [facility];  Service: Urology;  Laterality: N/A;  . CYSTOSCOPY WITH URETHRAL DILATATION N/A 08/18/2019   Procedure: CYSTOSCOPY WITH URETHRAL DILATATION USING BALLOON OR LASER/ SPURO PUBIC TUBE CHANGE LASER EXCISION OF URETHRAL STRICTURE RETROGRADE URETHROGRAM ANTEGRADE CYSTOGRAM;  Surgeon: [name], MD;  Location: [facility];  Service: Urology;  Laterality: N/A;  .  FRACTURE SURGERY    . INGUINAL HERNIA REPAIR     patient does not remember this procedure  . LAPAROSCOPIC APPENDECTOMY N/A 11/16/2015   Procedure: APPENDECTOMY LAPAROSCOPIC;  Surgeon: [name], MD;  Location: [facility];  Service: General;  Laterality: N/A;  . LEFT HEART CATH AND CORONARY ANGIOGRAPHY N/A 12/26/2016   Procedure: Left Heart Cath and Coronary Angiography;  Surgeon: Troy Sine, MD;  Location: [facility];  Service: Cardiovascular;  Laterality: N/A;  . MELANOMA EXCISION Right 10/24/2016   Procedure: WIDE EXCISION MELANOMA RIGHT NECK AND RIGHT CHEST TIMES 2;  Surgeon: [name], MD;  Location: [facility];  Service: General;  Laterality: Right;  right medial and lateral lesion of chest and right neck  . NASAL FRACTURE SURGERY     "broken years  ago; several ORs to correct it"  . NASAL SEPTUM SURGERY    . PROSTATE BIOPSY  2014   "needle biopsy"  . TEE WITHOUT CARDIOVERSION N/A 02/15/2016   Procedure: TRANSESOPHAGEAL ECHOCARDIOGRAM (TEE);  Surgeon: [name], MD;  Location: [facility];  Service: Open Heart Surgery;  Laterality: N/A;  . TONSILLECTOMY    . TRANSCATHETER AORTIC VALVE REPLACEMENT, TRANSFEMORAL  02/15/2016  . TRANSCATHETER AORTIC VALVE REPLACEMENT, TRANSFEMORAL N/A 02/15/2016   Procedure: TRANSCATHETER AORTIC VALVE REPLACEMENT, TRANSFEMORAL;  Surgeon: [name], MD;  Location: [facility];  Service: Open Heart Surgery;  Laterality: N/A;  . TRANSURETHRAL RESECTION OF PROSTATE  12/23/2018   Procedure: CYSTOSCOPY WITH  BLADDER BIOPSY WITH FULGERATION/ TRANSURETHRAL RESECTION PROSTATE  AND PROSTATE BIOPSY;  Surgeon: [name], MD;  Location: [facility];  Service: Urology;;        Home Medications    Prior to Admission medications   Medication Sig Start Date End Date Taking? Authorizing Provider  acetaminophen (TYLENOL) 500 MG tablet Take 500 mg by mouth every 6 (six) hours as needed for moderate pain.     [provider]  amoxicillin  (AMOXIL) 500 MG tablet Take 4 tablets (2,000 mg) one hour prior to ALL dental visits. 01/15/19   [name], MD  aspirin EC 81 MG tablet Take 81 mg by mouth at bedtime.     [provider]  carvedilol (COREG) 3.125 MG tablet TAKE 1 TABLET BY MOUTH TWICE DAILY. Patient taking differently: Take 3.125 mg by mouth 2 (two) times daily.  06/26/19   [name], MD  cephALEXin (KEFLEX) 500 MG capsule Take 1 capsule (500 mg total) by mouth 4 (four) times daily for 3 days. 08/18/19 08/21/19  Haskel Schroeder, MD  cyanocobalamin 2000 MCG tablet Take 2,000 mcg by mouth daily.    [provider]  DULoxetine (CYMBALTA) 30 MG capsule Take 30 mg by mouth daily.    [provider]  enzalutamide Gillermina Phy) 40 MG capsule Take 160 mg by mouth daily.    [provider]  ferrous sulfate 325 (65 FE) MG EC tablet Take 1 tablet (325 mg total) by mouth daily with breakfast. If no side effects, change to twice daily after 1 week. 12/24/18   Bonnielee Haff, MD  folic acid (FOLVITE) 1 MG tablet Take 1 mg by mouth See admin instructions. Everyday but Tues    [provider]  isosorbide mononitrate (IMDUR) 30 MG 24 hr tablet Take 1 tablet (30 mg total) by mouth daily. 10/22/18 10/22/19  Richardson Dopp T, PA-C  methotrexate (RHEUMATREX) 2.5 MG tablet Take 10 mg by mouth once a week. Tuesday    [provider]  MULTIPLE VITAMIN PO Take 1 tablet by mouth daily.    [provider]  nitroGLYCERIN (NITROSTAT) 0.4 MG SL tablet 1 TAB UNDER TONGUE AS NEEDED FOR CHEST PAIN. MAY REPEAT EVERY 5 MIN FOR A TOTAL OF 3 DOSES. Patient taking differently: Place 0.4 mg under the tongue every 5 (five) minutes as needed for chest pain.  07/07/19   [name], MD  oxyCODONE (ROXICODONE) 5 MG immediate release tablet Take 1 tablet (5 mg total) by mouth every 6 (six) hours as needed for up to 5 days. 08/18/19 08/23/19  Haskel Schroeder, MD  rosuvastatin (CRESTOR) 10 MG tablet TAKE 1  TABLET ONCE DAILY. Patient taking differently: Take 10 mg by mouth daily.  05/26/19   Tysinger, Camelia Eng, PA-C  tamsulosin (FLOMAX) 0.4 MG CAPS capsule Take 0.4 mg by mouth  daily. 07/07/19   [provider]    Family History Family History  Problem Relation Age of Onset  . Brain cancer Father   . Lymphoma Mother   . Depression Daughter     Social History Social History   Tobacco Use  . Smoking status: Former Smoker    Packs/day: 0.00    Types: Cigarettes  . Smokeless tobacco: Never Used  . Tobacco comment: "quit smoking cigarettes in the 1960s; quit smoking cigars in the 1990s  Substance Use Topics  . Alcohol use: Yes    Comment: 2 DRINKS PER DAY  . Drug use: No     Allergies   Patient has no known allergies.   Review of Systems Review of Systems  Constitutional: Positive for fatigue and fever.  Respiratory: Negative for cough and shortness of breath.   Gastrointestinal: Negative for abdominal pain, nausea and vomiting.  All other systems reviewed and are negative.    Physical Exam Updated Vital Signs BP (!) 103/49 (BP Location: Left Arm)   Pulse (!) 102   Temp (!) 103.3 F (39.6 C) (Rectal)   Resp 19   SpO2 94%   Physical Exam Vitals signs and nursing note reviewed.  Constitutional:      General: He is not in acute distress.    Appearance: He is well-developed.  HENT:     Head: Normocephalic and atraumatic.  Eyes:     Conjunctiva/sclera: Conjunctivae normal.  Neck:     Musculoskeletal: Normal range of motion.  Cardiovascular:     Rate and Rhythm: Regular rhythm. Tachycardia present.  Pulmonary:     Effort: Pulmonary effort is normal. No respiratory distress.     Breath sounds: Normal breath sounds.  Abdominal:     General: Bowel sounds are normal.     Palpations: Abdomen is soft.     Tenderness: There is no abdominal tenderness. There is no guarding or rebound.     Comments: Healing wound present to suprapubic region.  There is no  surrounding erythema or warmth.  No tenderness.  Urethral catheter in place.  Small amount of dried blood at the urethral meatus.  Musculoskeletal:     Right lower leg: No edema.     Left lower leg: No edema.  Skin:    General: Skin is warm.  Neurological:     Mental Status: He is alert.  Psychiatric:        Behavior: Behavior normal.      ED Treatments / Results  Labs (all labs ordered are listed, but only abnormal results are displayed) Labs Reviewed  COMPREHENSIVE METABOLIC PANEL - Abnormal; Notable for the following components:      Result Value   Potassium 3.0 (*)    Glucose, Bld 164 (*)    Calcium 7.6 (*)    Total Protein 6.2 (*)    All other components within normal limits  CBC WITH DIFFERENTIAL/PLATELET - Abnormal; Notable for the following components:   RBC 2.94 (*)    Hemoglobin 10.0 (*)    HCT 30.0 (*)    MCV 102.0 (*)    Platelets 83 (*)    Neutro Abs 9.6 (*)    Lymphs Abs 0.3 (*)    All other components within normal limits  URINALYSIS, ROUTINE W REFLEX MICROSCOPIC - Abnormal; Notable for the following components:   APPearance CLOUDY (*)    Hgb urine dipstick LARGE (*)    Ketones, ur 20 (*)    Protein, ur 100 (*)  Leukocytes,Ua SMALL (*)    RBC / HPF >50 (*)    Bacteria, UA RARE (*)    All other components within normal limits  SARS CORONAVIRUS 2 (HOSPITAL ORDER, [facility])  CULTURE, BLOOD (ROUTINE X 2)  CULTURE, BLOOD (ROUTINE X 2)  URINE CULTURE  LACTIC ACID, PLASMA    EKG EKG Interpretation  Date/Time:  Thursday August 21 2019 10:06:30 EDT Ventricular Rate:  92 PR Interval:    QRS Duration: 91 QT Interval:  432 QTC Calculation: 535 R Axis:   77 Text Interpretation:  Sinus rhythm Multiple premature complexes, vent & supraven Prolonged QT interval No significant change since last tracing Confirmed by Gareth Morgan [phone]) on 08/21/2019 12:50:13 PM   Radiology Dg Chest Port 1 View  Result Date: 08/21/2019  CLINICAL DATA:  Pt recently had a urinary tract surgery. Pt was taking cephalexin after D/C. Wife called EMS saying pt is febrile and more confused than normal. Hx of prostate ca with mets to bones. Pt very SOB, coughing, and acute fever.fever EXAM: PORTABLE CHEST 1 VIEW COMPARISON:  Radiograph 07/07/2019 FINDINGS: Normal mediastinum and cardiac silhouette. Normal pulmonary vasculature. No evidence of effusion, infiltrate, or pneumothorax. No acute bony abnormality. IMPRESSION: No acute cardiopulmonary process. Electronically Signed   By: Suzy Bouchard M.D.   On: 08/21/2019 11:25    Procedures .Critical Care Performed by: Robinson, [place] N, PA-C Authorized by: Robinson, [place] N, PA-C   Critical care provider statement:    Critical care time (minutes):  45   Critical care time was exclusive of:  Separately billable procedures and treating other patients and teaching time   Critical care was necessary to treat or prevent imminent or life-threatening deterioration of the following conditions:  Sepsis   Critical care was time spent personally by me on the following activities:  Discussions with consultants, evaluation of patient's response to treatment, examination of patient, ordering and performing treatments and interventions, ordering and review of laboratory studies, ordering and review of radiographic studies, pulse oximetry, re-evaluation of patient's condition, obtaining history from patient or surrogate and review of old charts   I assumed direction of critical care for this patient from another provider in my specialty: no     (including critical care time)  Medications Ordered in ED Medications  sodium chloride 0.9 % bolus 1,000 mL (has no administration in time range)  ceFEPIme (MAXIPIME) 2 g in sodium chloride 0.9 % 100 mL IVPB (has no administration in time range)     Initial Impression / Assessment and Plan / ED Course  I have reviewed the triage vital signs and the nursing  notes.  Pertinent labs & imaging results that were available during my care of the patient were reviewed by me and considered in my medical decision making (see chart for details).  Clinical Course as of Aug 20 1502  Thu Aug 21, 2019  1326 Dr. Posey Pronto accepting admission.   [JR]    Clinical Course User Index [JR] Robinson, [place] N, PA-C   Patient presenting from home with fever and feeling ill since last night.  He has history of metastatic prostate cancer and recently underwent urethral dilation by Dr. Junious Silk on 08/18/2019.  During that procedure suprapubic catheter was removed and patient has a urethral catheter in place.  He began feeling ill last night with associated fatigue.  On arrival, rectal temp is 103.3 F, patient is tachycardic and with soft blood pressures about 100/50.  Sepsis work-up initiated, most  likely urinary source given patient's recent history and no complaint of any other symptoms.  IV cefepime, IV fluids administered.  Patient received a gram of Tylenol in route by EMS.  Chest x-ray negative for infiltrate.  Lactic acid is 1.8.  Platelets are low though appears to be improving from previous.  Urine appears infected.  Culture sent.  Patient admitted to hospitalist service for sepsis.  Dr. Posey Pronto accepting admission.   Sepsis - Repeat Assessment  Performed at:    1255  Vitals     Blood pressure (!) 145/72, pulse (!) 110, temperature (!) 103.3 F (39.6 C), temperature source Rectal, resp. rate 18, SpO2 97 %.  Heart:     Tachycardic  Lungs:    CTA  Capillary Refill:   <2 sec  Peripheral Pulse:   Radial pulse palpable  Skin:     Normal Color       Patient discussed with and evaluated by Dr. Billy Fischer.  Final Clinical Impressions(s) / ED Diagnoses   Final diagnoses:  Sepsis, due to unspecified organism, unspecified whether acute organ dysfunction present [facility])    ED Discharge Orders    None       Robinson, [place] N, PA-C 08/21/19 1526     Gareth Morgan, MD 08/21/19 2327

## 2019-08-21 NOTE — Progress Notes (Signed)
Enzalutamide Gillermina Phy) Hold Criteria: . Seizures . Hgb < 8 . WBC < 2000 . ANC < 1000 . Pltc < 50K . Active infection . (Screen for drug interactions)    Xtandi held for active infection  Eudelia Bunch, Pharm.D [phone] 08/21/2019 4:57 PM

## 2019-08-21 NOTE — ED Notes (Signed)
Pt was cleaned upon arrival

## 2019-08-21 NOTE — ED Notes (Signed)
ED TO INPATIENT HANDOFF REPORT  Name/Age/Gender Corey Oneill 83 y.o. male  Code Status Code Status History    Date Active Date Inactive Code Status Order ID Comments User Context   12/22/2018 2355 12/24/2018 1655 Full Code [id number]  [name], MD ED   12/26/2016 1233 12/26/2016 2004 Full Code [id number]  Troy Sine, MD Inpatient   01/17/2016 0913 01/17/2016 1506 Full Code [id number]  [name], MD Inpatient   12/23/2015 0207 12/23/2015 1640 Full Code [id number]  Fay Records, MD Inpatient   11/12/2015 1239 11/21/2015 2026 Full Code [id number]  Erby Pian, NP ED   06/23/2015 1702 06/24/2015 1514 Full Code [id number]  [name], MD Inpatient   06/22/2015 2146 06/23/2015 1702 Full Code [id number]  Jacolyn Reedy, MD Inpatient   06/18/2015 1704 06/19/2015 1449 Full Code [id number]  [name], MD Inpatient   06/17/2015 2006 06/18/2015 1651 Full Code [id number]  Jacolyn Reedy, MD Inpatient   04/21/2015 0945 04/21/2015 1701 Full Code [id number]  [name], MD Inpatient   Advance Care Planning Activity      Home/SNF/Other Home  Chief Complaint fever tachy recent surgery  Level of Care/Admitting Diagnosis ED Disposition    ED Disposition Condition [facility] Area: [place] [[id number]]  Level of Care: Telemetry [5]  Admit to tele based on following criteria: Complex arrhythmia (Bradycardia/Tachycardia)  Covid Evaluation: Confirmed COVID Negative  Diagnosis: Sepsis due to urinary tract infection [facility]) [id number]  Admitting Physician: Lavina Hamman [id number]  Attending Physician: Lavina Hamman [id number]  Estimated length of stay: past midnight tomorrow  Certification:: I certify this patient will need inpatient services for at least 2 midnights  PT Class (Do Not Modify): Inpatient [101]  PT Acc Code (Do Not Modify): Private [1]       Medical History Past Medical History:  Diagnosis Date  . Abdominal aortic atherosclerosis ([place])   .  Anginal pain ([place])    last noted 08/17/19  . Bone cancer ([place])    Left hip  . BPPV (benign paroxysmal positional vertigo)   . Bullous pemphigoid   . CAD (coronary artery disease)    a.  LHC 8/16: Mid to distal LAD 30%, OM1 40%, proximal mid RCA 40%, distal RCA 60% >> FFR 0.69  >> PCI: 3 x 15 mm Resolute DES  . Depression   . Esophageal reflux   . Family history of adverse reaction to anesthesia    "daughter has PONV"  . Grade I diastolic dysfunction [id number]   Noted on ECHO   . Grade I diastolic dysfunction [id number]   Noted on ECHO  . H/O blood clots    "get them in my stool and urine" (11/12/2015)  . Heart murmur   . Hepatic lesion 12/08/2015   Stable 8 mm right hepatic lesion  . History of aortic stenosis    a. peak to peak gradient by LHC 8/16:  37 mmHg (moderately severe)   . History of appendicitis 2016  . History of blood transfusion 12/2018  . History of herpes labialis   . History of ITP    2018, thrombocytopenia  . History of kidney stones   . Hx of radiation therapy 04/14/13-06/09/13   prostate 7800 cGy, 40 sessions, seminal vesicles 5600 cGy 40 sessions  . Hydrocele 2006   Small  . Hyperlipidemia   . Insomnia    resolved  . LVH (left ventricular hypertrophy)    Moderate, noted on ECHO  09/2018  . Malignant melanoma in situ ([place]) 09/04/2016   Right neck and chest  . Melanoma ([place])    pt denies  . Metastasis from malignant neoplasm of prostate ([place]) 02/25/2019  . Prostate cancer ([place]) dx'd 2014  . Radiation proctitis   . Renal mass 2017   Bilateral renal masses  . S/P skin biopsy 04/09/2019   subepidermal cell poor vesicle , Linear IGG and C3 the basement membrane  . Suprapubic catheter ([place])   . Wears hearing aid in both ears     Allergies No Known Allergies  IV Location/Drains/Wounds Patient Lines/Drains/Airways Status   Active Line/Drains/Airways    Name:   Placement date:   Placement time:   Site:   Days:   Peripheral IV 08/18/19 Anterior;Left Wrist    08/18/19    1140    Wrist   3   Peripheral IV 08/21/19 Left Antecubital   08/21/19    -    Antecubital   less than 1   Closed System Drain 1 Medial RLQ Other (Comment) 12 Fr.   07/20/19    2019    RLQ   32   Urethral Catheter DR.ESKRIDGE Double-lumen;Straight-tip 22 Fr.   08/18/19    1442    Double-lumen;Straight-tip   3   Urethral Catheter dr.eskridge Double-lumen;Straight-tip   08/18/19    1450    Double-lumen;Straight-tip   3   Incision (Closed) 12/23/18 Perineum Other (Comment)   12/23/18    1845     241   Incision (Closed) 08/18/19 Abdomen Other (Comment)   08/18/19    1504     3          Labs/Imaging Results for orders placed or performed during the hospital encounter of 08/21/19 (from the past 48 hour(s))  Lactic acid, plasma     Status: None   Collection Time: 08/21/19 10:12 AM  Result Value Ref Range   Lactic Acid, Venous 1.8 0.5 - 1.9 mmol/L    Comment: Performed at [facility], [place] [address], [place], [place] [address]  Comprehensive metabolic panel     Status: Abnormal   Collection Time: 08/21/19 10:12 AM  Result Value Ref Range   Sodium 135 135 - 145 mmol/L   Potassium 3.0 (L) 3.5 - 5.1 mmol/L   Chloride 101 98 - 111 mmol/L   CO2 22 22 - 32 mmol/L   Glucose, Bld 164 (H) 70 - 99 mg/dL   BUN 16 8 - 23 mg/dL   Creatinine, Ser 0.78 0.61 - 1.24 mg/dL   Calcium 7.6 (L) 8.9 - 10.3 mg/dL   Total Protein 6.2 (L) 6.5 - 8.1 g/dL   Albumin 3.5 3.5 - 5.0 g/dL   AST 21 15 - 41 U/L   ALT 11 0 - 44 U/L   Alkaline Phosphatase 59 38 - 126 U/L   Total Bilirubin 0.8 0.3 - 1.2 mg/dL   GFR calc non Af Amer >60 >60 mL/min   GFR calc Af Amer >60 >60 mL/min   Anion gap 12 5 - 15    Comment: Performed at [facility], [place] [address], [place], [place] [address]  CBC WITH DIFFERENTIAL     Status: Abnormal   Collection Time: 08/21/19 10:12 AM  Result Value Ref Range   WBC 10.3 4.0 - 10.5 K/uL   RBC 2.94 (L) 4.22 - 5.81 MIL/uL   Hemoglobin 10.0  (L) 13.0 - 17.0 g/dL   HCT 30.0 (L) 39.0 - 52.0 %  MCV 102.0 (H) 80.0 - 100.0 fL   MCH 34.0 26.0 - 34.0 pg   MCHC 33.3 30.0 - 36.0 g/dL   RDW 12.6 11.5 - 15.5 %   Platelets 83 (L) 150 - 400 K/uL    Comment: REPEATED TO VERIFY PLATELET COUNT CONFIRMED BY SMEAR SPECIMEN CHECKED FOR CLOTS Immature Platelet Fraction may be clinically indicated, consider ordering this additional test [id number]    nRBC 0.0 0.0 - 0.2 %   Neutrophils Relative % 94 %   Neutro Abs 9.6 (H) 1.7 - 7.7 K/uL   Lymphocytes Relative 2 %   Lymphs Abs 0.3 (L) 0.7 - 4.0 K/uL   Monocytes Relative 3 %   Monocytes Absolute 0.4 0.1 - 1.0 K/uL   Eosinophils Relative 0 %   Eosinophils Absolute 0.0 0.0 - 0.5 K/uL   Basophils Relative 0 %   Basophils Absolute 0.0 0.0 - 0.1 K/uL   Immature Granulocytes 1 %   Abs Immature Granulocytes 0.06 0.00 - 0.07 K/uL    Comment: Performed at [facility], [place] [address], [place], [place] [address]  Urinalysis, Routine w reflex microscopic     Status: Abnormal   Collection Time: 08/21/19 10:12 AM  Result Value Ref Range   Color, Urine YELLOW YELLOW   APPearance CLOUDY (A) CLEAR   Specific Gravity, Urine 1.019 1.005 - 1.030   pH 6.0 5.0 - 8.0   Glucose, UA NEGATIVE NEGATIVE mg/dL   Hgb urine dipstick LARGE (A) NEGATIVE   Bilirubin Urine NEGATIVE NEGATIVE   Ketones, ur 20 (A) NEGATIVE mg/dL   Protein, ur 100 (A) NEGATIVE mg/dL   Nitrite NEGATIVE NEGATIVE   Leukocytes,Ua SMALL (A) NEGATIVE   RBC / HPF >50 (H) 0 - 5 RBC/hpf   WBC, UA 6-10 0 - 5 WBC/hpf   Bacteria, UA RARE (A) NONE SEEN   Mucus PRESENT     Comment: Performed at [facility], [place] [address] [place], [place] [address]  SARS Coronavirus 2 [facility] order, Performed in [facility] hospital lab) Nasopharyngeal Nasopharyngeal Swab     Status: None   Collection Time: 08/21/19 10:41 AM   Specimen: Nasopharyngeal Swab  Result Value Ref Range   SARS Coronavirus 2 NEGATIVE NEGATIVE     Comment: (NOTE) If result is NEGATIVE SARS-CoV-2 target nucleic acids are NOT DETECTED. The SARS-CoV-2 RNA is generally detectable in upper and lower  respiratory specimens during the acute phase of infection. The lowest  concentration of SARS-CoV-2 viral copies this assay can detect is 250  copies / mL. A negative result does not preclude SARS-CoV-2 infection  and should not be used as the sole basis for treatment or other  patient management decisions.  A negative result may occur with  improper specimen collection / handling, submission of specimen other  than nasopharyngeal swab, presence of viral mutation(s) within the  areas targeted by this assay, and inadequate number of viral copies  (<250 copies / mL). A negative result must be combined with clinical  observations, patient history, and epidemiological information. If result is POSITIVE SARS-CoV-2 target nucleic acids are DETECTED. The SARS-CoV-2 RNA is generally detectable in upper and lower  respiratory specimens dur ing the acute phase of infection.  Positive  results are indicative of active infection with SARS-CoV-2.  Clinical  correlation with patient history and other diagnostic information is  necessary to determine patient infection status.  Positive results do  not rule out bacterial infection or co-infection with other viruses. If result  is PRESUMPTIVE POSTIVE SARS-CoV-2 nucleic acids MAY BE PRESENT.   A presumptive positive result was obtained on the submitted specimen  and confirmed on repeat testing.  While 2019 novel coronavirus  (SARS-CoV-2) nucleic acids may be present in the submitted sample  additional confirmatory testing may be necessary for epidemiological  and / or clinical management purposes  to differentiate between  SARS-CoV-2 and other Sarbecovirus currently known to infect humans.  If clinically indicated additional testing with an alternate test  methodology [phone]) is advised. The  SARS-CoV-2 RNA is generally  detectable in upper and lower respiratory sp ecimens during the acute  phase of infection. The expected result is Negative. Fact Sheet for Patients:  StrictlyIdeas.no Fact Sheet for Healthcare Providers: BankingDealers.co.za This test is not yet approved or cleared by the [place] FDA and has been authorized for detection and/or diagnosis of SARS-CoV-2 by FDA under an Emergency Use Authorization (EUA).  This EUA will remain in effect (meaning this test can be used) for the duration of the COVID-19 declaration under Section 564(b)(1) of the Act, 21 U.S.C. section 360bbb-3(b)(1), unless the authorization is terminated or revoked sooner. Performed at [facility], [place] [address], [place], [place] [address]    Dg Chest Port 1 View  Result Date: 08/21/2019 CLINICAL DATA:  Pt recently had a urinary tract surgery. Pt was taking cephalexin after D/C. Wife called EMS saying pt is febrile and more confused than normal. Hx of prostate ca with mets to bones. Pt very SOB, coughing, and acute fever.fever EXAM: PORTABLE CHEST 1 VIEW COMPARISON:  Radiograph 07/07/2019 FINDINGS: Normal mediastinum and cardiac silhouette. Normal pulmonary vasculature. No evidence of effusion, infiltrate, or pneumothorax. No acute bony abnormality. IMPRESSION: No acute cardiopulmonary process. Electronically Signed   By: Suzy Bouchard M.D.   On: 08/21/2019 11:25    Pending Labs Unresulted Labs (From admission, onward)    Start     Ordered   08/21/19 1012  Blood Culture (routine x 2)  BLOOD CULTURE X 2,   STAT     08/21/19 1015   08/21/19 1012  Urine culture  ONCE - STAT,   STAT     08/21/19 1015   Signed and Held  Comprehensive metabolic panel  Tomorrow morning,   R     Signed and Held   Signed and Held  CBC  Tomorrow morning,   R     Signed and Held          Vitals/Pain Today's Vitals   08/21/19 1326 08/21/19  1337 08/21/19 1425 08/21/19 1527  BP:  (!) 176/71 (!) 151/67 (!) 137/59  Pulse:  (!) 111 (!) 109 (!) 101  Resp:  (!) 21 19 20   Temp: (!) 102.8 F (39.3 C)     TempSrc: Oral     SpO2:  96% 96% 95%    Isolation Precautions No active isolations  Medications Medications  carvedilol (COREG) tablet 3.125 mg (has no administration in time range)  lactated ringers infusion (has no administration in time range)  HYDROcodone-acetaminophen (NORCO/VICODIN) 5-325 MG per tablet 1-2 tablet (has no administration in time range)  ceFEPIme (MAXIPIME) 2 g in sodium chloride 0.9 % 100 mL IVPB (has no administration in time range)  ibuprofen (ADVIL) tablet 400 mg (has no administration in time range)  famotidine (PEPCID) tablet 20 mg (has no administration in time range)  potassium chloride SA (KLOR-CON) CR tablet 40 mEq (has no administration in time range)  sodium chloride 0.9 % bolus 1,000  mL (0 mLs Intravenous Stopped 08/21/19 1423)  ceFEPIme (MAXIPIME) 2 g in sodium chloride 0.9 % 100 mL IVPB (0 g Intravenous Stopped 08/21/19 1134)    Mobility walks with device

## 2019-08-21 NOTE — Progress Notes (Signed)
Patient arrived to unit skin assessed bilateral feet worse right. Pt arrived to unit with Foley catheter, changed from leg bag to over night bag.

## 2019-08-21 NOTE — Progress Notes (Signed)
A consult was received from an ED provider for Cefepime per pharmacy dosing.  The patient's profile has been reviewed for ht/wt/allergies/indication/available labs.   A one time order has already been placed by provider for Cefepime 2g IV.  Further antibiotics/pharmacy consults should be ordered by admitting physician if indicated.                       Thank you, Luiz Ochoa 08/21/2019  10:19 AM

## 2019-08-21 NOTE — H&P (Signed)
[facility] History and Physical   Patient: Corey Oneill [id number]   PCP: [name], MD DOB: [birth year]   DOA: 08/21/2019   DOS: 08/21/2019   DOS: the patient was seen and examined on 08/21/2019  Patient coming from: The patient is coming from Home  Chief Complaint: Fever  HPI: KEANTE TROJANOWSKI is a 83 y.o. male with Past medical history of aortic stenosis S/P TAVR, ITP, prostate cancer, radiation proctitis, HLD, depression, GERD, CAD, chronic diastolic CHF. Patient presented to the hospital with complaints of fever.  On 08/18/2019 patient underwent cystourethroscopy and balloon dilatation of the urethral stricture.  He had a suprapubic catheter which was changed to urethral Foley catheter. After this patient was discharged home on oral Keflex.  Patient started having fever last night along with some chills. He also was somewhat more confused when he woke up this morning. There is no fall no trauma no injury reported. There was no nausea no vomiting no diarrhea reported. Patient does not have any compressive chest pain cough abdominal pain dizziness or lightheadedness no focal deficit. Patient was compliant with all his medications.  ED Course: Found to be febrile.  Her leukocytosis.  Meeting sepsis criteria.  UA was showing pyuria and patient was referred for admission.  At his baseline ambulates with assistance independent for most of his ADL;  manages his medication on his own.  Review of Systems: as mentioned in the history of present illness.  All other systems reviewed and are negative.  Past Medical History:  Diagnosis Date  . Abdominal aortic atherosclerosis ([place])   . Anginal pain ([place])    last noted 08/17/19  . Bone cancer ([place])    Left hip  . BPPV (benign paroxysmal positional vertigo)   . Bullous pemphigoid   . CAD (coronary artery disease)    a.  LHC 8/16: Mid to distal LAD 30%, OM1 40%, proximal mid RCA 40%, distal RCA 60% >> FFR 0.69  >> PCI: 3 x 15 mm  Resolute DES  . Depression   . Esophageal reflux   . Family history of adverse reaction to anesthesia    "daughter has PONV"  . Grade I diastolic dysfunction [id number]   Noted on ECHO   . Grade I diastolic dysfunction [id number]   Noted on ECHO  . H/O blood clots    "get them in my stool and urine" (11/12/2015)  . Heart murmur   . Hepatic lesion 12/08/2015   Stable 8 mm right hepatic lesion  . History of aortic stenosis    a. peak to peak gradient by LHC 8/16:  37 mmHg (moderately severe)   . History of appendicitis 2016  . History of blood transfusion 12/2018  . History of herpes labialis   . History of ITP    2018, thrombocytopenia  . History of kidney stones   . Hx of radiation therapy 04/14/13-06/09/13   prostate 7800 cGy, 40 sessions, seminal vesicles 5600 cGy 40 sessions  . Hydrocele 2006   Small  . Hyperlipidemia   . Insomnia    resolved  . LVH (left ventricular hypertrophy)    Moderate, noted on ECHO 09/2018  . Malignant melanoma in situ ([place]) 09/04/2016   Right neck and chest  . Melanoma ([place])    pt denies  . Metastasis from malignant neoplasm of prostate ([place]) 02/25/2019  . Prostate cancer ([place]) dx'd 2014  . Radiation proctitis   . Renal mass 2017   Bilateral renal masses  . S/P  skin biopsy 04/09/2019   subepidermal cell poor vesicle , Linear IGG and C3 the basement membrane  . Suprapubic catheter ([place])   . Wears hearing aid in both ears    Past Surgical History:  Procedure Laterality Date  . CARDIAC CATHETERIZATION N/A 04/21/2015   Procedure: Right/Left Heart Cath and Coronary Angiography;  Surgeon: [name], MD;  Location: [facility];  Service: Cardiovascular;  Laterality: N/A;  . CARDIAC CATHETERIZATION N/A 06/18/2015   Procedure: Intravascular Pressure Wire/FFR Study;  Surgeon: [name], MD;  Location: [facility];  Service: Cardiovascular;  Laterality: N/A;  . CARDIAC CATHETERIZATION N/A 06/18/2015   Procedure: Coronary Stent  Intervention;  Surgeon: [name], MD;  Location: [facility];  Service: Cardiovascular;  Laterality: N/A;  . CARDIAC CATHETERIZATION N/A 06/18/2015   Procedure: Right/Left Heart Cath and Coronary Angiography;  Surgeon: [name], MD;  Location: [facility];  Service: Cardiovascular;  Laterality: N/A;  . CARDIAC CATHETERIZATION N/A 06/23/2015   Procedure: Left Heart Cath and Cors/Grafts Angiography;  Surgeon: [name], MD;  Location: [facility];  Service: Cardiovascular;  Laterality: N/A;  . CARDIAC CATHETERIZATION N/A 01/17/2016   Procedure: Right/Left Heart Cath and Coronary Angiography;  Surgeon: [name], MD;  Location: [facility];  Service: Cardiovascular;  Laterality: N/A;  . CATARACT EXTRACTION, BILATERAL    . COLONOSCOPY  06/26/2017  . CYSTOSCOPY WITH BIOPSY N/A 07/30/2018   Procedure: CYSTOSCOPY WITH BIOPSY/FULGURATION, CYSTOLTHOLAPAXY;  Surgeon: [name], MD;  Location: [facility];  Service: Urology;  Laterality: N/A;  . CYSTOSCOPY WITH URETHRAL DILATATION N/A 08/18/2019   Procedure: CYSTOSCOPY WITH URETHRAL DILATATION USING BALLOON OR LASER/ SPURO PUBIC TUBE CHANGE LASER EXCISION OF URETHRAL STRICTURE RETROGRADE URETHROGRAM ANTEGRADE CYSTOGRAM;  Surgeon: [name], MD;  Location: [facility];  Service: Urology;  Laterality: N/A;  . FRACTURE SURGERY    . INGUINAL HERNIA REPAIR     patient does not remember this procedure  . LAPAROSCOPIC APPENDECTOMY N/A 11/16/2015   Procedure: APPENDECTOMY LAPAROSCOPIC;  Surgeon: [name], MD;  Location: [facility];  Service: General;  Laterality: N/A;  . LEFT HEART CATH AND CORONARY ANGIOGRAPHY N/A 12/26/2016   Procedure: Left Heart Cath and Coronary Angiography;  Surgeon: Troy Sine, MD;  Location: [facility];  Service: Cardiovascular;  Laterality: N/A;  . MELANOMA EXCISION Right 10/24/2016   Procedure: WIDE EXCISION MELANOMA RIGHT NECK AND RIGHT CHEST TIMES 2;  Surgeon:  [name], MD;  Location: [facility];  Service: General;  Laterality: Right;  right medial and lateral lesion of chest and right neck  . NASAL FRACTURE SURGERY     "broken years ago; several ORs to correct it"  . NASAL SEPTUM SURGERY    . PROSTATE BIOPSY  2014   "needle biopsy"  . TEE WITHOUT CARDIOVERSION N/A 02/15/2016   Procedure: TRANSESOPHAGEAL ECHOCARDIOGRAM (TEE);  Surgeon: [name], MD;  Location: [facility];  Service: Open Heart Surgery;  Laterality: N/A;  . TONSILLECTOMY    . TRANSCATHETER AORTIC VALVE REPLACEMENT, TRANSFEMORAL  02/15/2016  . TRANSCATHETER AORTIC VALVE REPLACEMENT, TRANSFEMORAL N/A 02/15/2016   Procedure: TRANSCATHETER AORTIC VALVE REPLACEMENT, TRANSFEMORAL;  Surgeon: [name], MD;  Location: [facility];  Service: Open Heart Surgery;  Laterality: N/A;  . TRANSURETHRAL RESECTION OF PROSTATE  12/23/2018   Procedure: CYSTOSCOPY WITH  BLADDER BIOPSY WITH FULGERATION/ TRANSURETHRAL RESECTION PROSTATE  AND PROSTATE BIOPSY;  Surgeon: [name], MD;  Location: [facility];  Service: Urology;;   Social History:  reports that he has quit smoking. His smoking use included cigarettes. He smoked 0.00 packs per day. He has never used smokeless tobacco. He reports current alcohol use. He reports that he does not use drugs.  No Known Allergies   Family history reviewed and not pertinent Family History  Problem Relation Age of Onset  . Brain cancer Father   . Lymphoma Mother   . Depression Daughter      Prior to Admission medications   Medication Sig Start Date End Date Taking? Authorizing Provider  acetaminophen (TYLENOL) 500 MG tablet Take 500 mg by mouth every 6 (six) hours as needed for moderate pain.    Yes [provider]  aspirin EC 81 MG tablet Take 81 mg by mouth at bedtime.    Yes [provider]  carvedilol (COREG) 3.125 MG tablet TAKE 1 TABLET BY MOUTH TWICE DAILY. Patient taking differently: Take 3.125 mg by mouth 2 (two)  times daily.  06/26/19  Yes [name], MD  cyanocobalamin 2000 MCG tablet Take 2,000 mcg by mouth daily.   Yes [provider]  DULoxetine (CYMBALTA) 30 MG capsule Take 30 mg by mouth daily.   Yes [provider]  enzalutamide (XTANDI) 40 MG capsule Take 160 mg by mouth daily.   Yes [provider]  ferrous sulfate 325 (65 FE) MG EC tablet Take 1 tablet (325 mg total) by mouth daily with breakfast. If no side effects, change to twice daily after 1 week. 12/24/18  Yes Bonnielee Haff, MD  folic acid (FOLVITE) 1 MG tablet Take 1 mg by mouth See admin instructions. Everyday but Tues   Yes [provider]  isosorbide mononitrate (IMDUR) 30 MG 24 hr tablet Take 1 tablet (30 mg total) by mouth daily. 10/22/18 10/22/19 Yes Weaver, Scott T, PA-C  methotrexate (RHEUMATREX) 2.5 MG tablet Take 10 mg by mouth once a week. Tuesday   Yes [provider]  methotrexate 2.5 MG tablet Take 2.5 mg by mouth daily. 08/11/19  Yes [provider]  MULTIPLE VITAMIN PO Take 1 tablet by mouth daily.   Yes [provider]  oxyCODONE (ROXICODONE) 5 MG immediate release tablet Take 1 tablet (5 mg total) by mouth every 6 (six) hours as needed for up to 5 days. 08/18/19 08/23/19 Yes Haskel Schroeder, MD  rosuvastatin (CRESTOR) 10 MG tablet TAKE 1 TABLET ONCE DAILY. Patient taking differently: Take 10 mg by mouth daily.  05/26/19  Yes Tysinger, Camelia Eng, PA-C  amoxicillin (AMOXIL) 500 MG tablet Take 4 tablets (2,000 mg) one hour prior to ALL dental visits. Patient not taking: Reported on 08/21/2019 01/15/19   [name], MD  cephALEXin (KEFLEX) 500 MG capsule Take 1 capsule (500 mg total) by mouth 4 (four) times daily for 3 days. 08/18/19 08/21/19  Haskel Schroeder, MD  nitroGLYCERIN (NITROSTAT) 0.4 MG SL tablet 1 TAB UNDER TONGUE AS NEEDED FOR CHEST PAIN. MAY REPEAT EVERY 5 MIN FOR A TOTAL OF 3 DOSES. Patient taking differently: Place 0.4 mg under the tongue  every 5 (five) minutes as needed for chest pain.  07/07/19   [name], MD    Physical Exam: Vitals:   08/21/19 1527 08/21/19 1530 08/21/19 1600 08/21/19 1619  BP: (!) 137/59 127/68 (!) 134/57 127/62  Pulse: (!) 101 99 (!) 101 (!) 105  Resp: 20 20 19 18   Temp:    (!) 102.7 F (39.3 C)  TempSrc:  Oral  SpO2: 95% 95% 97% 99%    General: alert and oriented to time, place, and person. Appear in mild distress, affect appropriate Eyes: PERRL, Conjunctiva normal ENT: Oral Mucosa Clear, moist  Neck: NO JVD, NO Abnormal Mass Or lumps Cardiovascular: S1 and S2 Present, NO Murmur, peripheral pulses symmetrical Respiratory: good respiratory effort, Bilateral Air entry equal and Decreased, no signs of accessory muscle use, Clear to Auscultation, no Crackles, no wheezes Abdomen: Bowel Sound present, Soft and no tenderness, no hernia Skin: no rashes  Extremities: no Pedal edema, no calf tenderness Neurologic: without any new focal findings Gait not checked due to patient safety concerns  Data Reviewed: I have personally reviewed and interpreted labs, imaging as discussed below.  CBC: Recent Labs  Lab 08/21/19 1012  WBC 10.3  NEUTROABS 9.6*  HGB 10.0*  HCT 30.0*  MCV 102.0*  PLT 83*   Basic Metabolic Panel: Recent Labs  Lab 08/21/19 1012  NA 135  K 3.0*  CL 101  CO2 22  GLUCOSE 164*  BUN 16  CREATININE 0.78  CALCIUM 7.6*   GFR: Estimated Creatinine Clearance: 69.7 mL/min (by C-G formula based on SCr of 0.78 mg/dL). Liver Function Tests: Recent Labs  Lab 08/21/19 1012  AST 21  ALT 11  ALKPHOS 59  BILITOT 0.8  PROT 6.2*  ALBUMIN 3.5   No results for input(s): LIPASE, AMYLASE in the last 168 hours. No results for input(s): AMMONIA in the last 168 hours. Coagulation Profile: No results for input(s): INR, PROTIME in the last 168 hours. Cardiac Enzymes: No results for input(s): CKTOTAL, CKMB, CKMBINDEX, TROPONINI in the last 168 hours. BNP (last 3 results)  No results for input(s): PROBNP in the last 8760 hours. HbA1C: No results for input(s): HGBA1C in the last 72 hours. CBG: No results for input(s): GLUCAP in the last 168 hours. Lipid Profile: No results for input(s): CHOL, HDL, LDLCALC, TRIG, CHOLHDL, LDLDIRECT in the last 72 hours. Thyroid Function Tests: No results for input(s): TSH, T4TOTAL, FREET4, T3FREE, THYROIDAB in the last 72 hours. Anemia Panel: No results for input(s): VITAMINB12, FOLATE, FERRITIN, TIBC, IRON, RETICCTPCT in the last 72 hours. Urine analysis:    Component Value Date/Time   COLORURINE YELLOW 08/21/2019 1012   APPEARANCEUR CLOUDY (A) 08/21/2019 1012   LABSPEC 1.019 08/21/2019 1012   PHURINE 6.0 08/21/2019 1012   GLUCOSEU NEGATIVE 08/21/2019 1012   HGBUR LARGE (A) 08/21/2019 1012   BILIRUBINUR NEGATIVE 08/21/2019 1012   BILIRUBINUR n 10/15/2015 0939   KETONESUR 20 (A) 08/21/2019 1012   PROTEINUR 100 (A) 08/21/2019 1012   UROBILINOGEN 0.2 10/15/2015 0939   UROBILINOGEN 0.2 06/23/2015 1443   NITRITE NEGATIVE 08/21/2019 1012   LEUKOCYTESUR SMALL (A) 08/21/2019 1012    Radiological Exams on Admission: Dg Chest Port 1 View  Result Date: 08/21/2019 CLINICAL DATA:  Pt recently had a urinary tract surgery. Pt was taking cephalexin after D/C. Wife called EMS saying pt is febrile and more confused than normal. Hx of prostate ca with mets to bones. Pt very SOB, coughing, and acute fever.fever EXAM: PORTABLE CHEST 1 VIEW COMPARISON:  Radiograph 07/07/2019 FINDINGS: Normal mediastinum and cardiac silhouette. Normal pulmonary vasculature. No evidence of effusion, infiltrate, or pneumothorax. No acute bony abnormality. IMPRESSION: No acute cardiopulmonary process. Electronically Signed   By: Suzy Bouchard M.D.   On: 08/21/2019 11:25   I reviewed all nursing notes, pharmacy notes, vitals, pertinent old records.  Assessment/Plan 1.  Sepsis due to UTI S/P recent ureteral dilatation as well  as suprapubic catheter  removal with Foley catheter placement potential catheter appears to be functioning well. Found to be febrile.  Had soft blood pressure.  Leukocytosis and tachycardia. Meeting sepsis criteria. Urine and is showing evidence of pyuria. Given recent procedure patient will be treated with IV cefepime. Follow-up on cultures. Chest x-ray unremarkable. COVID-19 negative. Continue to monitor the patient on telemetry.  2.  CAD HTN TAVR Chronic diastolic CHF HLD Continue aspirin, Coreg, Imdur, Crestor. Continue gentle hydration.  Monitor for volume overload. Patient is not on any diuretic does not appear to be volume overloaded right now.  3.  Mood disorder. On Cymbalta at home. Continue.  4.  Prostate cancer.  Stage IV, castration resistance Patient is on Xtandi. Patient has significant retention.  He underwent cystoscopy and on July 20, 2019 a suprapubic catheter was placed.  at patient was brought into the hospital on 08/15/2019 for removal of the suprapubic catheter with Foley catheter placement.  5.  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 Patient is on methotrexate. Monitor.  Follows up with [facility].  6.  Chronic ITP. Platelet count chronically low. Currently 83 pretension monitor. Patient also has chronic macrocytosis as well as anemia which is unchanged for now  monitor.  Nutrition: Regular diet DVT Prophylaxis: SCD, pharmacological prophylaxis contraindicated due to Thrombocytopenia  Advance goals of care discussion: Full code   Consults: none  Family Communication: no family was present at bedside, at the time of interview.   Disposition: Admitted as inpatient, telemetry unit. Likely to be discharged home, in 2-3 days.  I have discussed plan of care as described above with RN and patient/family.  Author: Berle Mull, MD [facility] 08/21/2019 6:16 PM   To reach On-call, see care teams to locate the attending and reach out to them via  [email or web address]. If 7PM-7AM, please contact night-coverage If you still have difficulty reaching the attending provider, please page the [facility] (Director on Call) for [facility] on amion for assistance.

## 2019-08-21 NOTE — Progress Notes (Signed)
Pharmacy Antibiotic Note  PATON SHAMPINE is a 83 y.o. male admitted on 08/21/2019 with UTI.  Pharmacy has been consulted for cefepime dosing.  cystoscopy with urethral dilation on 08/18/2019 by Dr. Junious Silk  Plan: Cefepime 2 gm IV q8h Pharmacy to sign off    Temp (24hrs), Avg:103.1 F (39.5 C), Min:102.8 F (39.3 C), Max:103.3 F (39.6 C)  Recent Labs  Lab 08/21/19 1012  WBC 10.3  CREATININE 0.78  LATICACIDVEN 1.8    Estimated Creatinine Clearance: 69.7 mL/min (by C-G formula based on SCr of 0.78 mg/dL).    No Known Allergies  Thank you for allowing pharmacy to be a part of this patient's care.   Eudelia Bunch, Pharm.D [phone] 08/21/2019 3:13 PM

## 2019-08-21 NOTE — ED Triage Notes (Signed)
Per EMS: Pt recently had a urinary tract surgery.  Pt was taking cephalexin after D/C.  Wife called EMS saying pt is febrile and more confused than normal.   18 gauge LAC 20 gauge RFA 250 NS  BP 108/64 108 HR 99% RA 34 CO2  CBG 132 28 RR 101 temp Pt given 1000 tylenol and has had 250 mL NS.

## 2019-08-22 ENCOUNTER — Inpatient Hospital Stay (HOSPITAL_COMMUNITY): Payer: Medicare Other

## 2019-08-22 DIAGNOSIS — I2510 Atherosclerotic heart disease of native coronary artery without angina pectoris: Secondary | ICD-10-CM

## 2019-08-22 DIAGNOSIS — Z955 Presence of coronary angioplasty implant and graft: Secondary | ICD-10-CM

## 2019-08-22 DIAGNOSIS — I350 Nonrheumatic aortic (valve) stenosis: Secondary | ICD-10-CM

## 2019-08-22 DIAGNOSIS — N390 Urinary tract infection, site not specified: Secondary | ICD-10-CM

## 2019-08-22 DIAGNOSIS — B952 Enterococcus as the cause of diseases classified elsewhere: Secondary | ICD-10-CM

## 2019-08-22 DIAGNOSIS — R7881 Bacteremia: Secondary | ICD-10-CM

## 2019-08-22 DIAGNOSIS — Z87891 Personal history of nicotine dependence: Secondary | ICD-10-CM

## 2019-08-22 DIAGNOSIS — Z952 Presence of prosthetic heart valve: Secondary | ICD-10-CM

## 2019-08-22 LAB — BLOOD CULTURE ID PANEL (REFLEXED)

## 2019-08-22 LAB — COMPREHENSIVE METABOLIC PANEL
ALT: 13 U/L (ref 0–44)
AST: 23 U/L (ref 15–41)
Albumin: 2.7 g/dL — ABNORMAL LOW (ref 3.5–5.0)
Alkaline Phosphatase: 49 U/L (ref 38–126)
Anion gap: 9 (ref 5–15)
BUN: 22 mg/dL (ref 8–23)
CO2: 23 mmol/L (ref 22–32)
Calcium: 7.1 mg/dL — ABNORMAL LOW (ref 8.9–10.3)
Chloride: 102 mmol/L (ref 98–111)
Creatinine, Ser: 0.82 mg/dL (ref 0.61–1.24)
GFR calc Af Amer: >60 mL/min (ref >60)
GFR calc non Af Amer: >60 mL/min (ref >60)
Glucose, Bld: 113 mg/dL — ABNORMAL HIGH (ref 70–99)
Potassium: 3.4 mmol/L — ABNORMAL LOW (ref 3.5–5.1)
Sodium: 134 mmol/L — ABNORMAL LOW (ref 135–145)
Total Bilirubin: 0.5 mg/dL (ref 0.3–1.2)
Total Protein: 5.4 g/dL — ABNORMAL LOW (ref 6.5–8.1)

## 2019-08-22 LAB — ECHOCARDIOGRAM COMPLETE
Height: 70 in
Weight: 2398.6 oz

## 2019-08-22 LAB — CBC
HCT: 27.4 % — ABNORMAL LOW (ref 39.0–52.0)
Hemoglobin: 9.2 g/dL — ABNORMAL LOW (ref 13.0–17.0)
MCH: 34.1 pg — ABNORMAL HIGH (ref 26.0–34.0)
MCHC: 33.6 g/dL (ref 30.0–36.0)
MCV: 101.5 fL — ABNORMAL HIGH (ref 80.0–100.0)
Platelets: 51 10*3/uL — ABNORMAL LOW (ref 150–400)
RBC: 2.7 MIL/uL — ABNORMAL LOW (ref 4.22–5.81)
RDW: 13 % (ref 11.5–15.5)
WBC: 4.8 10*3/uL (ref 4.0–10.5)
nRBC: 0 % (ref 0.0–0.2)

## 2019-08-22 LAB — URINE CULTURE: Culture: NO GROWTH

## 2019-08-22 MED ORDER — DILTIAZEM HCL-DEXTROSE 125-5 MG/125ML-% IV SOLN (PREMIX)
5.0000 mg/h | INTRAVENOUS | Status: DC
  Filled 2019-08-22: qty 125

## 2019-08-22 MED ORDER — SODIUM CHLORIDE 0.9 % IV SOLN
2.0000 g | INTRAVENOUS | Status: DC
  Administered 2019-08-22 – 2019-08-27 (×33): 2 g via INTRAVENOUS
  Filled 2019-08-22 (×15): qty 2
  Filled 2019-08-22 (×2): qty 2000
  Filled 2019-08-22 (×9): qty 2
  Filled 2019-08-22 (×2): qty 2000
  Filled 2019-08-22 (×7): qty 2
  Filled 2019-08-22: qty 2000
  Filled 2019-08-22 (×2): qty 2

## 2019-08-22 MED ORDER — DILTIAZEM LOAD VIA INFUSION
15.0000 mg | Freq: Once | INTRAVENOUS | Status: DC
  Filled 2019-08-22: qty 15

## 2019-08-22 NOTE — Progress Notes (Signed)
PROGRESS NOTE  Corey Oneill  DOB: 02/27/1936  PCP: [name], MD XMI:[id number]  DOA: 08/21/2019  LOS: 1 day   Brief narrative: Corey Oneill is a 83 y.o. male with PMH of aortic stenosis s/p TAVR, CAD, chronic diastolic CHF, ITP, prostate cancer, radiation proctitis, HLD, depression, GERD. At his baseline ambulates with assistance, independent for most of his ADL;  manages his medication on his own. March 2014 - prostate cancer diagnosed. Follows up with Dr. 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 April 2020 -started on enzalutamide September 2020 -patient presented with urine retention.  A Foley catheter could not be placed.  He underwent a suprapubic catheter placement by IR. 10/2 -patient saw urologist in the office. 10/5 -patient underwent urethral stricture dilation, suprapubic tube removal and a urethral Foley catheter placement.  He was discharged home on oral Keflex. 10/8-patient presented to the ED with complaint of fever and chills for 24 hours.  In the ED, patient had a temperature of 103.3. Chest x-ray unremarkable. COVID-19 negative. Urinalysis showed cloudy urine with a small amount of leukocytes and rare bacteria Patient was admitted to hospitalist service for UTI sepsis.  Subjective: Patient was seen and examined this morning.  Pleasant elderly Caucasian male.  Propped up in bed.  Drowsy.  Wife at bedside.  Patient is not in physical distress.  Earlier this morning, patient was in A. fib with RVR which spontaneously resolved after he was given home dose of Coreg.  Assessment/Plan: Sepsis due to UTI -POA -associated with recent instrumentation and Foley catheter placement. -Met sepsis criteria on admission. -Urinalysis showed cloudy urine with a small amount of leukocytes and rare bacteria -IV cefepime was started empirically. -Fever trending down.  Monitor WBC trend.  -Continue to monitor the patient on telemetry.  A. fib with RVR -This morning, patient flipped to A. fib with RVR with rate up to 150s.  Asymptomatic. -Family reports no history of A. fib in the past.  After morning dose of Coreg was given, patient spontaneously converted back to normal sinus rhythm.  Continue to monitor in telemetry.  Cardiovascular issues: CAD, chronic diastolic CHF, hypertension, HLD, aortic stenosis s/p TAVR -Home meds include aspirin, Coreg, Imdur, Crestor. -Also resumed. -Continue gentle hydration.  Monitor for volume overload. -Patient is not on any diuretic does not appear to be volume overloaded right now. -Last echo from 2019 with EF more than 60%.  Mood disorder. -Continue Cymbalta from home.  Prostate cancer, Stage IV, castration resistance -Patient is on Xtandi. -Recently underwent dilatation of urethral stricture conversion of suprapubic catheter to urethral Foley catheter.  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 Patient is on methotrexate. Monitor.  Follows up with [facility].  Chronic ITP. Platelet count chronically low. Currently 51 presentation.  Pretension monitor. Patient also has chronic macrocytosis as well as anemia which is unchanged for now  Monitor.  Mobility: PT eval ordered. DVT prophylaxis:  SCDs Code Status:   Code Status: Full Code  Family Communication:  Wife at bedside Expected Discharge:  Anticipate home in the next 2 to 3 days.  Consultants:    Procedures:    Antimicrobials: Anti-infectives (From admission, onward)   Start     Dose/Rate Route Frequency Ordered Stop   08/22/19 0515  ampicillin (OMNIPEN) 2 g in sodium chloride 0.9 % 100 mL IVPB     2 g 300 mL/hr over 20 Minutes Intravenous Every 4 hours  08/22/19 0501     08/21/19 1800  ceFEPIme (MAXIPIME) 2 g in sodium chloride 0.9 % 100 mL IVPB  Status:  Discontinued     2 g 200 mL/hr over 30 Minutes Intravenous Every 8 hours 08/21/19 1514 08/22/19  0500   08/21/19 1030  ceFEPIme (MAXIPIME) 2 g in sodium chloride 0.9 % 100 mL IVPB     2 g 200 mL/hr over 30 Minutes Intravenous  Once 08/21/19 1015 08/21/19 1134      Diet Order            Diet regular Room service appropriate? Yes; Fluid consistency: Thin  Diet effective now              Infusions:  . ampicillin (OMNIPEN) IV 2 g (08/22/19 0900)  . diltiazem (CARDIZEM) infusion    . lactated ringers 75 mL/hr at 08/22/19 0049    Scheduled Meds: . aspirin EC  81 mg Oral QHS  . carvedilol  3.125 mg Oral BID WC  . Chlorhexidine Gluconate Cloth  6 each Topical Daily  . diltiazem  15 mg Intravenous Once  . docusate sodium  100 mg Oral BID  . DULoxetine  30 mg Oral Daily  . isosorbide mononitrate  30 mg Oral Daily  . rosuvastatin  10 mg Oral Daily  . sodium chloride flush  3 mL Intravenous Q12H  . tamsulosin  0.4 mg Oral Daily    PRN meds: acetaminophen **OR** acetaminophen, HYDROcodone-acetaminophen, ondansetron **OR** ondansetron (ZOFRAN) IV   Objective: Vitals:   08/22/19 0839 08/22/19 0859  BP: 114/77 120/70  Pulse: (!) 144 (!) 169  Resp: 18   Temp: 100 F (37.8 C)   SpO2: 95%     Intake/Output Summary (Last 24 hours) at 08/22/2019 1218 Last data filed at 08/22/2019 0916 Gross per 24 hour  Intake 2264.98 ml  Output 550 ml  Net 1714.98 ml   Filed Weights   08/21/19 1624  Weight: 68 kg   Weight change:  Body mass index is 21.51 kg/m.   Physical Exam: General exam: Appears calm and comfortable.  Skin: No rashes, lesions or ulcers. HEENT: Atraumatic, normocephalic, supple neck, no obvious bleeding Lungs: Clear to auscultation bilaterally CVS: Regular rate and rhythm, no murmur GI/Abd soft, nontender, non-distended, bowel sound present CNS: Alert, awake, demented at baseline, able to answer simple questions. Psychiatry: Mood appropriate Extremities: No pedal edema, no calf tenderness  Data Review: I have personally reviewed the laboratory data and  studies available.  Recent Labs  Lab 08/21/19 1012 08/22/19 0443  WBC 10.3 4.8  NEUTROABS 9.6*  --   HGB 10.0* 9.2*  HCT 30.0* 27.4*  MCV 102.0* 101.5*  PLT 83* 51*   Recent Labs  Lab 08/21/19 1012 08/22/19 0443  NA 135 134*  K 3.0* 3.4*  CL 101 102  CO2 22 23  GLUCOSE 164* 113*  BUN 16 22  CREATININE 0.78 0.82  CALCIUM 7.6* 7.1*    Terrilee Croak, MD  [facility] 08/22/2019

## 2019-08-22 NOTE — Progress Notes (Signed)
  Echocardiogram 2D Echocardiogram has been performed.  Bobbye Charleston 08/22/2019, 4:21 PM

## 2019-08-22 NOTE — Progress Notes (Signed)
PHARMACY - PHYSICIAN COMMUNICATION CRITICAL VALUE ALERT - BLOOD CULTURE IDENTIFICATION (BCID)  Corey Oneill is an 83 y.o. male who presented to [facility] on 08/21/2019 with a chief complaint of fever  Assessment:  Patient started on Cefepime for UTI.  Patient with Entercoccus species as noted below in 4/4 bottles.  (include suspected source if known)  Name of physician (or Provider) Contacted: none  Current antibiotics: Cefepime  Changes to prescribed antibiotics recommended:  Changed antibiotics to ampicillin 2gm iv q4hr per algorithm/protocol  Results for orders placed or performed during the hospital encounter of 08/21/19  Blood Culture ID Panel (Reflexed) (Collected: 08/21/2019 10:12 AM)  Result Value Ref Range   Enterococcus species DETECTED (A) NOT DETECTED   Vancomycin resistance NOT DETECTED NOT DETECTED   Listeria monocytogenes NOT DETECTED NOT DETECTED   Staphylococcus species NOT DETECTED NOT DETECTED   Staphylococcus aureus (BCID) NOT DETECTED NOT DETECTED   Streptococcus species NOT DETECTED NOT DETECTED   Streptococcus agalactiae NOT DETECTED NOT DETECTED   Streptococcus pneumoniae NOT DETECTED NOT DETECTED   Streptococcus pyogenes NOT DETECTED NOT DETECTED   Acinetobacter baumannii NOT DETECTED NOT DETECTED   Enterobacteriaceae species NOT DETECTED NOT DETECTED   Enterobacter cloacae complex NOT DETECTED NOT DETECTED   Escherichia coli NOT DETECTED NOT DETECTED   Klebsiella oxytoca NOT DETECTED NOT DETECTED   Klebsiella pneumoniae NOT DETECTED NOT DETECTED   Proteus species NOT DETECTED NOT DETECTED   Serratia marcescens NOT DETECTED NOT DETECTED   Haemophilus influenzae NOT DETECTED NOT DETECTED   Neisseria meningitidis NOT DETECTED NOT DETECTED   Pseudomonas aeruginosa NOT DETECTED NOT DETECTED   Candida albicans NOT DETECTED NOT DETECTED   Candida glabrata NOT DETECTED NOT DETECTED   Candida krusei NOT DETECTED NOT DETECTED   Candida parapsilosis NOT  DETECTED NOT DETECTED   Candida tropicalis NOT DETECTED NOT DETECTED    Nani Skillern Crowford 08/22/2019  4:59 AM

## 2019-08-22 NOTE — Progress Notes (Addendum)
PT Cancellation Note  Patient Details Name: Corey Oneill MRN: [id number] DOB: [birth year]   Cancelled Treatment:    Reason Eval/Treat Not Completed: Attempted PT eval-pt declined to participate at this time. Will check back as schedule allows. Noted HR elevated this am as well. Will likely hold PT today and check back another day if HR remains elevated. Thanks.    Weston Anna, PT Acute Rehabilitation Services Pager: [phone] Office: [phone]

## 2019-08-22 NOTE — Progress Notes (Signed)
   08/21/19 2320  Vitals  BP (!) 89/40  BP Location Right Arm  BP Method Manual  Patient Position (if appropriate) Lying  MEWS Score  MEWS RR 0  MEWS Pulse 0  MEWS Systolic 1  MEWS LOC 0  MEWS Temp 0  MEWS Score 1  MEWS Score Color Green    Pt was asymptomatic. Paged Dr Dewaine Oats and order for bolus LR 578mL/h was administered. Rechecked BP at 0057 h which showed 103/56. Continued to monitor.

## 2019-08-22 NOTE — Progress Notes (Addendum)
RN contacted by CMT to report increase in HR. Pt assessed and complained of chest discomfort. EKG completed, AFIB 140's. VS stable see flow sheet. MEWS yellow,Hospitalist provider updated. No new orders at this time administer am dose coreg. Will continue to monitor.

## 2019-08-22 NOTE — Consult Note (Signed)
[facility] for Infectious Disease       Reason for Consult: Enterococcal bacteremia    Referring Physician: CHAMP autoconsult  Active Problems:   Sepsis due to urinary tract infection ([place])   . aspirin EC  81 mg Oral QHS  . carvedilol  3.125 mg Oral BID WC  . Chlorhexidine Gluconate Cloth  6 each Topical Daily  . diltiazem  15 mg Intravenous Once  . docusate sodium  100 mg Oral BID  . DULoxetine  30 mg Oral Daily  . isosorbide mononitrate  30 mg Oral Daily  . rosuvastatin  10 mg Oral Daily  . sodium chloride flush  3 mL Intravenous Q12H  . tamsulosin  0.4 mg Oral Daily    Recommendations: Continue ampicillin TEE I will do a TTE since TEE likely not until next week Repeat blood cultures   Assessment: He has 4/4 bottles with Enterococcus with recent urologic procedure.  No new urinary complaints though and UA and culture now c/w infection.  Regardless, with circulating bacteria, will look for endocarditis.    Antibiotics: Ampicillin day 2  HPI: Corey Oneill is a 83 y.o. male with a history of aortic stenosis with TAVR done in 2017 by Dr. Cyndia Bent, CAD with history of PCI and urethral stricture s/p cystogram, dilation and laser incision of urethral stricture that was done on 10/5 who comes in with fever and altered mental status.  No associated n/v.  No new urinary symptoms though has had discomfort after the procedure.  Fever of 102.8 on admission.  Blood cultures sent and positive for above.  Feels better since admission.     Review of Systems:  Constitutional: negative for sweats and anorexia; positive for fatigue Gastrointestinal: negative for nausea and diarrhea Integument/breast: negative for rash All other systems reviewed and are negative    Past Medical History:  Diagnosis Date  . Abdominal aortic atherosclerosis ([place])   . Anginal pain ([place])    last noted 08/17/19  . Bone cancer ([place])    Left hip  . BPPV (benign paroxysmal positional vertigo)   .  Bullous pemphigoid   . CAD (coronary artery disease)    a.  LHC 8/16: Mid to distal LAD 30%, OM1 40%, proximal mid RCA 40%, distal RCA 60% >> FFR 0.69  >> PCI: 3 x 15 mm Resolute DES  . Depression   . Esophageal reflux   . Family history of adverse reaction to anesthesia    "daughter has PONV"  . Grade I diastolic dysfunction [id number]   Noted on ECHO   . Grade I diastolic dysfunction [id number]   Noted on ECHO  . H/O blood clots    "get them in my stool and urine" (11/12/2015)  . Heart murmur   . Hepatic lesion 12/08/2015   Stable 8 mm right hepatic lesion  . History of aortic stenosis    a. peak to peak gradient by LHC 8/16:  37 mmHg (moderately severe)   . History of appendicitis 2016  . History of blood transfusion 12/2018  . History of herpes labialis   . History of ITP    2018, thrombocytopenia  . History of kidney stones   . Hx of radiation therapy 04/14/13-06/09/13   prostate 7800 cGy, 40 sessions, seminal vesicles 5600 cGy 40 sessions  . Hydrocele 2006   Small  . Hyperlipidemia   . Insomnia    resolved  . LVH (left ventricular hypertrophy)    Moderate, noted on ECHO 09/2018  .  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 Melanoma ([place])    pt denies  . Metastasis from malignant neoplasm of prostate ([place]) 02/25/2019  . Prostate cancer ([place]) dx'd 2014  . Radiation proctitis   . Renal mass 2017   Bilateral renal masses  . S/P skin biopsy 04/09/2019   subepidermal cell poor vesicle , Linear IGG and C3 the basement membrane  . Suprapubic catheter ([place])   . Wears hearing aid in both ears     Social History   Tobacco Use  . Smoking status: Former Smoker    Packs/day: 0.00    Types: Cigarettes  . Smokeless tobacco: Never Used  . Tobacco comment: "quit smoking cigarettes in the 1960s; quit smoking cigars in the 1990s  Substance Use Topics  . Alcohol use: Yes    Comment: 2 DRINKS PER DAY  . Drug use: No    Family History  Problem  Relation Age of Onset  . Brain cancer Father   . Lymphoma Mother   . Depression Daughter     No Known Allergies  Physical Exam: Constitutional: in no apparent distress  Vitals:   08/22/19 1100 08/22/19 1235  BP: 112/62 (!) 108/58  Pulse: 88 98  Resp: 16 18  Temp: 100 F (37.8 C) 98.6 F (37 C)  SpO2: 98% 96%   EYES: anicteric ENMT: no thrush Cardiovascular: Cor RRR Respiratory: CTAB; normal respiratory effort GI: Bowel sounds are normal, liver is not enlarged, spleen is not enlarged Musculoskeletal: no pedal edema noted Skin: negatives: no rash Neuro: non-focal  Lab Results  Component Value Date   WBC 4.8 08/22/2019   HGB 9.2 (L) 08/22/2019   HCT 27.4 (L) 08/22/2019   MCV 101.5 (H) 08/22/2019   PLT 51 (L) 08/22/2019    Lab Results  Component Value Date   CREATININE 0.82 08/22/2019   BUN 22 08/22/2019   NA 134 (L) 08/22/2019   K 3.4 (L) 08/22/2019   CL 102 08/22/2019   CO2 23 08/22/2019    Lab Results  Component Value Date   ALT 13 08/22/2019   AST 23 08/22/2019   ALKPHOS 49 08/22/2019     Microbiology: Recent Results (from the past 240 hour(s))  Novel Coronavirus, NAA (Hosp order, Send-out to [facility]; TAT 18-24 hrs     Status: None   Collection Time: 08/14/19 11:35 AM   Specimen: Nasopharyngeal Swab; Respiratory  Result Value Ref Range Status   SARS-CoV-2, NAA NOT DETECTED NOT DETECTED Final    Comment: (NOTE) This nucleic acid amplification test was developed and its performance characteristics determined by [employer]. Nucleic acid amplification tests include PCR and TMA. This test has not been FDA cleared or approved. This test has been authorized by FDA under an Emergency Use Authorization (EUA). This test is only authorized for the duration of time the declaration that circumstances exist justifying the authorization of the emergency use of in vitro diagnostic tests for detection of SARS-CoV-2 virus and/or diagnosis of COVID-19  infection under section 564(b)(1) of the Act, 21 U.S.C. [id number]) (1), unless the authorization is terminated or revoked sooner. When diagnostic testing is negative, the possibility of a false negative result should be considered in the context of a patient's recent exposures and the presence of clinical signs and symptoms consistent with COVID-19. An individual without symptoms of COVID- 19 and who is not shedding SARS-CoV-2 vi rus would expect to have a negative (not detected) result in this assay.  Performed At: [facility] [address] [place], [place] [id number] Rush Farmer MD [id number]    [place]  Final    Comment: Performed at [facility], [place] [address], [place], [place] [address]  Blood Culture (routine x 2)     Status: Abnormal (Preliminary result)   Collection Time: 08/21/19 10:12 AM   Specimen: BLOOD  Result Value Ref Range Status   Specimen Description   Final    BLOOD RIGHT ANTECUBITAL Performed at [facility] [address], [place], [place] [address]    Special Requests   Final    BOTTLES DRAWN AEROBIC AND ANAEROBIC Blood Culture results may not be optimal due to an inadequate volume of blood received in culture bottles Performed at [facility] [address], [place], [place] [address]    Culture  Setup Time   Final    IN BOTH AEROBIC AND ANAEROBIC BOTTLES GRAM POSITIVE COCCI CRITICAL RESULT CALLED TO, READ BACK BY AND VERIFIED WITH: Lavell Luster [facility] 08/22/19 0128 JDW    Culture (A)  Final    ENTEROCOCCUS FAECALIS SUSCEPTIBILITIES TO FOLLOW Performed at [facility], [place] [address], [place], [place] [address]    Report Status PENDING  Incomplete  Urine culture     Status: None   Collection Time: 08/21/19 10:12 AM   Specimen: In/Out Cath Urine  Result Value Ref Range Status   Specimen Description   Final    IN/OUT CATH URINE Performed at [facility] [address] [place], [place] [address]    Special Requests   Final    NONE Performed at [facility], [place] [address], [place], [place] [address]    Culture   Final    NO GROWTH Performed at [facility], [place] [address], [place],  [address]    Report Status 08/22/2019 FINAL  Final  Blood Culture ID Panel (Reflexed)     Status: Abnormal   Collection Time: 08/21/19 10:12 AM  Result Value Ref Range Status   Enterococcus species DETECTED (A) NOT DETECTED Final    Comment: CRITICAL RESULT CALLED TO, READ BACK BY AND VERIFIED WITH: J GRIMSLEY PHARMD 08/22/19 0128 JDW    Vancomycin resistance NOT DETECTED NOT DETECTED Final   Listeria monocytogenes NOT DETECTED NOT DETECTED Final   Staphylococcus species NOT DETECTED NOT DETECTED Final   Staphylococcus aureus (BCID) NOT DETECTED NOT DETECTED Final   Streptococcus species NOT DETECTED NOT DETECTED Final   Streptococcus agalactiae NOT DETECTED NOT DETECTED Final   Streptococcus pneumoniae NOT DETECTED NOT DETECTED Final   Streptococcus pyogenes NOT DETECTED NOT DETECTED Final   Acinetobacter baumannii NOT DETECTED NOT DETECTED Final   Enterobacteriaceae species NOT DETECTED NOT DETECTED Final   Enterobacter cloacae complex NOT DETECTED NOT DETECTED Final   Escherichia coli NOT DETECTED NOT DETECTED Final   Klebsiella oxytoca NOT DETECTED NOT DETECTED Final   Klebsiella pneumoniae NOT DETECTED NOT DETECTED Final   Proteus species NOT DETECTED NOT DETECTED Final   Serratia marcescens NOT DETECTED NOT DETECTED Final   Haemophilus influenzae NOT DETECTED NOT DETECTED Final   Neisseria meningitidis NOT DETECTED NOT DETECTED Final   Pseudomonas aeruginosa NOT DETECTED NOT DETECTED Final   Candida albicans NOT DETECTED NOT DETECTED Final   Candida glabrata NOT DETECTED NOT DETECTED Final   Candida krusei NOT DETECTED NOT DETECTED Final   Candida parapsilosis NOT DETECTED NOT DETECTED Final    Candida  tropicalis NOT DETECTED NOT DETECTED Final    Comment: Performed at [facility], [place] [address], [place], [place] [address]  Blood Culture (routine x 2)     Status: Abnormal (Preliminary result)   Collection Time: 08/21/19 10:17 AM   Specimen: BLOOD  Result Value Ref Range Status   Specimen Description   Final    BLOOD BLOOD LEFT FOREARM Performed at [facility] [address], [place], [place] [address]    Special Requests   Final    BOTTLES DRAWN AEROBIC AND ANAEROBIC Blood Culture adequate volume Performed at [facility] [address] [place], [place] [address]    Culture  Setup Time   Final    IN BOTH AEROBIC AND ANAEROBIC BOTTLES GRAM POSITIVE COCCI CRITICAL VALUE NOTED.  VALUE IS CONSISTENT WITH PREVIOUSLY REPORTED AND CALLED VALUE.    Culture (A)  Final    ENTEROCOCCUS FAECALIS SUSCEPTIBILITIES TO FOLLOW Performed at [facility], [place] [address] [place], [place] [address]    Report Status PENDING  Incomplete  SARS Coronavirus 2 [facility] order, Performed in [facility] hospital lab) Nasopharyngeal Nasopharyngeal Swab     Status: None   Collection Time: 08/21/19 10:41 AM   Specimen: Nasopharyngeal Swab  Result Value Ref Range Status   SARS Coronavirus 2 NEGATIVE NEGATIVE Final    Comment: (NOTE) If result is NEGATIVE SARS-CoV-2 target nucleic acids are NOT DETECTED. The SARS-CoV-2 RNA is generally detectable in upper and lower  respiratory specimens during the acute phase of infection. The lowest  concentration of SARS-CoV-2 viral copies this assay can detect is 250  copies / mL. A negative result does not preclude SARS-CoV-2 infection  and should not be used as the sole basis for treatment or other  patient management decisions.  A negative result may occur with  improper specimen collection / handling, submission of specimen other  than nasopharyngeal swab, presence of viral mutation(s) within the  areas  targeted by this assay, and inadequate number of viral copies  (<250 copies / mL). A negative result must be combined with clinical  observations, patient history, and epidemiological information. If result is POSITIVE SARS-CoV-2 target nucleic acids are DETECTED. The SARS-CoV-2 RNA is generally detectable in upper and lower  respiratory specimens dur ing the acute phase of infection.  Positive  results are indicative of active infection with SARS-CoV-2.  Clinical  correlation with patient history and other diagnostic information is  necessary to determine patient infection status.  Positive results do  not rule out bacterial infection or co-infection with other viruses. If result is PRESUMPTIVE POSTIVE SARS-CoV-2 nucleic acids MAY BE PRESENT.   A presumptive positive result was obtained on the submitted specimen  and confirmed on repeat testing.  While 2019 novel coronavirus  (SARS-CoV-2) nucleic acids may be present in the submitted sample  additional confirmatory testing may be necessary for epidemiological  and / or clinical management purposes  to differentiate between  SARS-CoV-2 and other Sarbecovirus currently known to infect humans.  If clinically indicated additional testing with an alternate test  methodology [phone]) is advised. The SARS-CoV-2 RNA is generally  detectable in upper and lower respiratory sp ecimens during the acute  phase of infection. The expected result is Negative. Fact Sheet for Patients:  StrictlyIdeas.no Fact Sheet for Healthcare Providers: BankingDealers.co.za This test is not yet approved or cleared by the [place] FDA and has been authorized for detection and/or diagnosis of SARS-CoV-2 by FDA under  an Emergency Use Authorization (EUA).  This EUA will remain in effect (meaning this test can be used) for the duration of the COVID-19 declaration under Section 564(b)(1) of the Act, 21 U.S.C. section  360bbb-3(b)(1), unless the authorization is terminated or revoked sooner. Performed at [facility], [place] [address] [place], [place] [address]     Robert W Comer, MD [facility] for Infectious Disease [facility] [email or web address] 08/22/2019, 12:49 PM

## 2019-08-22 NOTE — Progress Notes (Signed)
OT Cancellation Note  Patient Details Name: FABER GELTZ MRN: [id number] DOB: 1936/01/31   Cancelled Treatment:    Reason Eval/Treat Not Completed: Fatigue/lethargy limiting ability to participate per nursing. Will check back asap  Shlomo Seres 08/22/2019, 2:37 PM  Lesle Chris, OTR/L Acute Rehabilitation Services [phone] [facility] pager [phone] office 08/22/2019

## 2019-08-22 NOTE — Progress Notes (Signed)
Patient reassessed, VS stable. Provider updated rate has decreased 110 now ST. Will hold Cardizem gtt for now.

## 2019-08-23 LAB — CULTURE, BLOOD (ROUTINE X 2): Special Requests: ADEQUATE

## 2019-08-23 NOTE — Progress Notes (Signed)
PROGRESS NOTE  Corey Oneill  DOB: [birth year]  PCP: Corey Lung, MD HGD:[id number]  DOA: 08/21/2019  LOS: 2 days   Brief narrative: Corey Oneill is a 83 y.o. male with PMH of aortic stenosis s/p TAVR, CAD, chronic diastolic CHF, ITP, prostate cancer, radiation proctitis, HLD, depression, GERD. At his baseline ambulates with assistance, independent for most of his ADL;  manages his medication on his own. March 2014 - prostate cancer diagnosed. Follows up with Dr. Junious Oneill underwent external beam radiation therapy January and February 2020 -CT scan and bone scan showed left iliac metastasis. April 2020 -started on enzalutamide September 2020 -patient presented with urine retention.  A Foley catheter could not be placed.  He underwent a suprapubic catheter placement by IR. 10/2 -patient saw urologist in the office. 10/5 -patient underwent urethral stricture dilation, suprapubic tube removal and a urethral Foley catheter placement.  He was discharged home on oral Keflex. 10/8-patient presented to the ED with complaint of fever and chills for 24 hours.  In the ED, patient had a temperature of 103.3. Chest x-ray unremarkable. COVID-19 negative. Urinalysis showed cloudy urine with a small amount of leukocytes and rare bacteria Patient was admitted to hospitalist service for UTI sepsis. Blood culture as well as urine culture obtained on admission is showing growth of Enterococcus faecalis.  Subjective: Patient was seen and examined this morning.  Pleasant elderly Caucasian male.  Lying down in bed.  Not in distress.  Wife not at bedside today.    Assessment/Plan: Sepsis due to UTI -POA UTI and bacteremia with Enterococcus faecalis -associated with recent instrumentation and Foley catheter placement. -Met sepsis criteria on admission. -Urinalysis showed cloudy urine with a small amount of leukocytes and rare bacteria -IV cefepime was started empirically. -Blood culture as well as  urine culture obtained on admission is showing growth of Enterococcus faecalis. -Infectious disease consult appreciated.  Monitor for TTE to rule out vegetation. -Fever trending down.  WBC remains normal  A. fib with RVR -No history of A. fib.  Patient had a brief episode of RVR on 10/9, spontaneously converted after home dose of Coreg with given.   -Continue to monitor in telemetry.  Cardiovascular issues: CAD, chronic diastolic CHF, hypertension, HLD, aortic stenosis s/p TAVR -Home meds include aspirin, Coreg, Imdur, Crestor. -All resumed.  Blood pressure remained in normal range. -Continue gentle hydration.  Monitor for volume overload. -Patient is not on any diuretic does not appear to be volume overloaded right now. -Last echo from 2019 with EF more than 60%.  Mood disorder. -Continue Cymbalta from home.  Prostate cancer, Stage IV, castration resistance -Patient is on Xtandi. -Recently underwent dilatation of urethral stricture conversion of suprapubic catheter to urethral Foley catheter.  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 Patient is on methotrexate. Monitor.  Follows up with [facility].  Chronic ITP. Platelet count chronically low, 51 on last check on 10/9.  Repeat blood work Architectural technologist. Patient also has chronic macrocytosis as well as anemia which is unchanged for now  Monitor.  Chronic anemia -Macrocytosis, MCV 101.5 -Hemoglobin only slightly low at baseline. -Obtain anemia panel as well as vitamin B12.  Mobility: PT eval ordered. DVT prophylaxis:  SCDs Code Status:   Code Status: Full Code  Family Communication:  Wife at bedside Expected Discharge:  Anticipate home in the next 2 to 3 days.  Consultants:  Infectious disease  Procedures:    Antimicrobials: Anti-infectives (From admission, onward)   Start     Dose/Rate  Route Frequency Ordered Stop   08/22/19 0515  ampicillin (OMNIPEN) 2 g in sodium chloride 0.9 % 100 mL IVPB     2 g 300  mL/hr over 20 Minutes Intravenous Every 4 hours 08/22/19 0501     08/21/19 1800  ceFEPIme (MAXIPIME) 2 g in sodium chloride 0.9 % 100 mL IVPB  Status:  Discontinued     2 g 200 mL/hr over 30 Minutes Intravenous Every 8 hours 08/21/19 1514 08/22/19 0500   08/21/19 1030  ceFEPIme (MAXIPIME) 2 g in sodium chloride 0.9 % 100 mL IVPB     2 g 200 mL/hr over 30 Minutes Intravenous  Once 08/21/19 1015 08/21/19 1134      Diet Order            Diet regular Room service appropriate? Yes; Fluid consistency: Thin  Diet effective now              Infusions:  . ampicillin (OMNIPEN) IV 2 g (08/23/19 0741)  . lactated ringers Stopped (08/22/19 0256)    Scheduled Meds: . aspirin EC  81 mg Oral QHS  . carvedilol  3.125 mg Oral BID WC  . Chlorhexidine Gluconate Cloth  6 each Topical Daily  . docusate sodium  100 mg Oral BID  . DULoxetine  30 mg Oral Daily  . isosorbide mononitrate  30 mg Oral Daily  . rosuvastatin  10 mg Oral Daily  . sodium chloride flush  3 mL Intravenous Q12H  . tamsulosin  0.4 mg Oral Daily    PRN meds: acetaminophen **OR** acetaminophen, HYDROcodone-acetaminophen, ondansetron **OR** ondansetron (ZOFRAN) IV   Objective: Vitals:   08/23/19 0445 08/23/19 0735  BP: (!) 123/59 125/72  Pulse: 80 78  Resp: 18 16  Temp: 98.3 F (36.8 C) 98.3 F (36.8 C)  SpO2: 92% 93%    Intake/Output Summary (Last 24 hours) at 08/23/2019 0921 Last data filed at 08/23/2019 0800 Gross per 24 hour  Intake 371.01 ml  Output 700 ml  Net -328.99 ml   Filed Weights   08/21/19 1624  Weight: 68 kg   Weight change:  Body mass index is 21.51 kg/m.   Physical Exam: General exam: Appears calm and comfortable.  Has a Foley catheter on. Skin: No rashes, lesions or ulcers. HEENT: Atraumatic, normocephalic, supple neck, no obvious bleeding Lungs: Clear to auscultation bilaterally CVS: Regular rate and rhythm, no murmur GI/Abd soft, nontender, non-distended, bowel sound present CNS:  Alert, awake, able to answer simple questions.  Able to follow commands.   Psychiatry: Mood appropriate Extremities: No pedal edema, no calf tenderness  Data Review: I have personally reviewed the laboratory data and studies available.  Recent Labs  Lab 08/21/19 1012 08/22/19 0443  WBC 10.3 4.8  NEUTROABS 9.6*  --   HGB 10.0* 9.2*  HCT 30.0* 27.4*  MCV 102.0* 101.5*  PLT 83* 51*   Recent Labs  Lab 08/21/19 1012 08/22/19 0443  NA 135 134*  K 3.0* 3.4*  CL 101 102  CO2 22 23  GLUCOSE 164* 113*  BUN 16 22  CREATININE 0.78 0.82  CALCIUM 7.6* 7.1*    Terrilee Croak, MD  [facility] 08/23/2019

## 2019-08-23 NOTE — Progress Notes (Signed)
Pt complains of lower chest discomfort. Reports that it is not an acute finding and is not painful but uncomfortable. Vitals stable, EKG taken, on call provider notified.

## 2019-08-23 NOTE — Evaluation (Signed)
Physical Therapy Evaluation Patient Details Name: Corey Oneill MRN: [id number] DOB: 06-Mar-1936 Today's Date: 08/23/2019   History of Present Illness  Corey Oneill is a 83 y.o. male with PMH of aortic stenosis s/p TAVR, CAD, chronic diastolic CHF, ITP, prostate cancer, radiation proctitis, HLD, depression, GERD.10/5 -patient underwent urethral stricture dilation, suprapubic tube removal and a urethral Foley catheter placement.  He was discharged home on oral Keflex, returned 10/8 with increased fever and chills.  Clinical Impression  Pt with some evident generalized weakness and decreased activity tolerance requiring some min A for mobility with RW at the moment. To benefit from continued PT to get patient back to baseline and back home with wife. Feel pt should progress well here and could ambulate with nursing staff with 1 person assist and RW at this time as well. Will continue to follow while here.     Follow Up Recommendations Home health PT(depending on pt's progression here)    Equipment Recommendations  Rolling walker with 5" wheels(only has cane, may need RW depending on progression here)    Recommendations for Other Services       Precautions / Restrictions Precautions Precautions: None      Mobility  Bed Mobility Overal bed mobility: Needs Assistance Bed Mobility: Supine to Sit;Sit to Supine     Supine to sit: Min guard     General bed mobility comments: sitting EOB with B LE exercises pt with a lot of posterior lean . Had to hold to edge of bed to steady his upright position  Transfers Overall transfer level: Needs assistance Equipment used: Rolling walker (2 wheeled) Transfers: Sit to/from Stand Sit to Stand: Min assist         General transfer comment: to steady a little upon rising  Ambulation/Gait Ambulation/Gait assistance: Min assist;Min guard Gait Distance (Feet): 180 Feet Assistive device: Rolling walker (2 wheeled) Gait Pattern/deviations:  Step-through pattern     General Gait Details: had about 4 moments when he side stepped a little unsteady and initially had difficulty walking and moving head. This improved as out time OOB and distance increased. So feel this may subside fairly quickly just attributing to not being out of bed in last 3 days.  Stairs            Wheelchair Mobility    Modified Rankin (Stroke Patients Only)       Balance Overall balance assessment: Needs assistance Sitting-balance support: Bilateral upper extremity supported;Feet supported Sitting balance-Leahy Scale: Fair Sitting balance - Comments: had some posterior leaning to move LEs with exercises and had to steady himself by pulling /holding to the edge of the mattress to avoid posterior lean. Postural control: Posterior lean Standing balance support: Bilateral upper extremity supported Standing balance-Leahy Scale: Fair Standing balance comment: usign RW with functional straight line walking still with some unsteady moments with minguard to minA to steady and reassure pt.                             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     Hand Dominance        Extremity/Trunk Assessment        Lower Extremity Assessment Lower Extremity Assessment: Generalized weakness       Communication    Communication: No difficulties  Cognition Arousal/Alertness: Awake/alert Behavior During Therapy: WFL for tasks assessed/performed Overall Cognitive Status: Within Functional Limits for tasks assessed                                        General Comments      Exercises     Assessment/Plan    PT Assessment Patient needs continued PT services  PT Problem List Decreased strength;Decreased activity tolerance;Decreased balance;Decreased mobility       PT Treatment Interventions DME instruction;Gait training;Functional mobility training;Therapeutic activities;Therapeutic exercise;Patient/family education    PT Goals (Current goals can be found in the Care Plan section)  Acute Rehab PT Goals Patient Stated Goal: Iw ant to feel better and get back to exercises and walking my dog PT Goal Formulation: With patient Time For Goal Achievement: 09/06/19 Potential to Achieve Goals: Good    Frequency Min 3X/week   Barriers to discharge        Co-evaluation               AM-PAC PT "6 Clicks" Mobility  Outcome Measure Help needed turning from your back to your side while in a flat bed without using bedrails?: A Little Help needed moving from lying on your back to sitting on the side of a flat bed without using bedrails?: A Little Help needed moving to and from a bed to a chair (including a wheelchair)?: A Little Help needed standing up from a chair using your arms (e.g., wheelchair or bedside chair)?: A Little Help needed to walk in hospital room?: A Little Help needed climbing 3-5 steps with a railing? : A Little 6 Click Score: 18    End of Session Equipment Utilized During Treatment: Gait belt Activity Tolerance: Patient tolerated treatment well Patient left: in chair;with chair alarm set;with call bell/phone within reach Nurse Communication: Mobility status PT Visit Diagnosis: Unsteadiness on feet (R26.81);Muscle weakness (generalized) (M62.81)    Time:  [id number] PT Time Calculation (min) (ACUTE ONLY): 49 min   Charges:   PT Evaluation $PT Eval Low Complexity: 1 Low PT Treatments $Gait Training: 8-22 mins        Clide Dales, PT Acute Rehabilitation Services Pager: [phone] Office: [phone] 08/23/2019   Clide Dales 08/23/2019, 12:59 PM

## 2019-08-23 NOTE — Evaluation (Signed)
Occupational Therapy Evaluation Patient Details Name: Corey Oneill MRN: [id number] DOB: 1936-03-15 Today's Date: 08/23/2019    History of Present Illness Corey Oneill is a 83 y.o. male with PMH of aortic stenosis s/p TAVR, CAD, chronic diastolic CHF, ITP, prostate cancer, radiation proctitis, HLD, depression, GERD.10/5 -patient underwent urethral stricture dilation, suprapubic tube removal and a urethral Foley catheter placement.  He was discharged home on oral Keflex, returned 10/8 with increased fever and chills.   Clinical Impression   PTA Pt enjoyed walking his dog daily and was independent in ADL and mobility/transfers (had SPC "just in case" but rarely used it). Today Pt is limited by generalized weakness, decreased activity tolerance, and decreased access to LB for ADL (bathing/dressing). Pt is min guard assist for transfers with RW, mod A for LB ADL, and will benefit from skilled OT in the acute setting with focus on AE and compensatory strategies for LB ADL. As well as energy conservation education. At this time, do not anticipate need for follow up- but will watch for progress. Recommending 3 in 1 for use as shower chair fir safety.     Follow Up Recommendations  Supervision/Assistance - 24 hour;No OT follow up(initially)    Equipment Recommendations  3 in 1 bedside commode;Other (comment)(long handle sponge, AE for LB ADL)    Recommendations for Other Services       Precautions / Restrictions Precautions Precautions: None Restrictions Weight Bearing Restrictions: No      Mobility Bed Mobility Overal bed mobility: Needs Assistance Bed Mobility: Supine to Sit;Sit to Supine     Supine to sit: Min guard     General bed mobility comments: OOB in recliner at beginning and end of session  Transfers Overall transfer level: Needs assistance Equipment used: Rolling walker (2 wheeled) Transfers: Sit to/from Stand Sit to Stand: Min assist         General transfer  comment: steady balance assist, vc for safe hand placement    Balance Overall balance assessment: Needs assistance Sitting-balance support: Bilateral upper extremity supported;Feet supported;Single extremity supported Sitting balance-Leahy Scale: Fair Sitting balance - Comments: lateral leans for attempts at LB dressing Postural control: Posterior lean Standing balance support: Bilateral upper extremity supported Standing balance-Leahy Scale: Fair Standing balance comment: benefits from external support from RW or sink                           ADL either performed or assessed with clinical judgement   ADL Overall ADL's : Needs assistance/impaired Eating/Feeding: Modified independent   Grooming: Min guard;Wash/dry hands;Wash/dry face;Oral care;Standing Grooming Details (indicate cue type and reason): sink level Upper Body Bathing: Sitting;Set up   Lower Body Bathing: Moderate assistance;Sitting/lateral leans   Upper Body Dressing : Set up;Sitting   Lower Body Dressing: Moderate assistance;Sit to/from stand Lower Body Dressing Details (indicate cue type and reason): assist from "knees down" pain with bending due to abdominal sx Toilet Transfer: Min guard;Minimal assistance;Ambulation;RW Toilet Transfer Details (indicate cue type and reason): simulated through recliner transfer         Functional mobility during ADLs: Minimal assistance;Min guard;Rolling walker General ADL Comments: decreased ability to access LB for bathing/dressing     Vision         Perception     Praxis      Pertinent Vitals/Pain Pain Assessment: Faces Faces Pain Scale: Hurts little more Pain Location: abdomen with bending Pain Descriptors / Indicators: Discomfort;Grimacing Pain Intervention(s): Monitored during  session;Repositioned     Hand Dominance Right   Extremity/Trunk Assessment Upper Extremity Assessment Upper Extremity Assessment: Generalized weakness   Lower Extremity  Assessment Lower Extremity Assessment: Defer to PT evaluation       Communication Communication Communication: No difficulties   Cognition Arousal/Alertness: Awake/alert Behavior During Therapy: WFL for tasks assessed/performed Overall Cognitive Status: Within Functional Limits for tasks assessed                                     General Comments       Exercises     Shoulder Instructions      Home Living Family/patient expects to be discharged to:: Private residence Living Arrangements: Spouse/significant other Available Help at Discharge: Family;Available 24 hours/day Type of Home: House Home Access: Level entry     Home Layout: One level     Bathroom Shower/Tub: Occupational psychologist: Handicapped height     Home Equipment: Cane - single point          Prior Functioning/Environment Level of Independence: Independent with assistive device(s)        Comments: 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        OT Problem List: Decreased activity tolerance;Impaired balance (sitting and/or standing);Decreased knowledge of use of DME or AE;Pain      OT Treatment/Interventions: Self-care/ADL training;Energy conservation;DME and/or AE instruction;Therapeutic activities;Patient/family education;Balance training    OT Goals(Current goals can be found in the care plan section) Acute Rehab OT Goals Patient Stated Goal: get back to exercise and walking my dog daily OT Goal Formulation: With patient Time For Goal Achievement: 09/06/19 Potential to Achieve Goals: Good ADL Goals Pt Will Perform Grooming: with modified independence;standing Pt Will Perform Lower Body Bathing: with modified independence;sit to/from stand;with adaptive equipment Pt Will Perform Lower Body Dressing:  with modified independence;sit to/from stand;with adaptive equipment  OT Frequency: Min 2X/week   Barriers to D/C:            Co-evaluation              AM-PAC OT "6 Clicks" Daily Activity     Outcome Measure Help from another person eating meals?: None Help from another person taking care of personal grooming?: A Little Help from another person toileting, which includes using toliet, bedpan, or urinal?: A Little Help from another person bathing (including washing, rinsing, drying)?: A Lot Help from another person to put on and taking off regular upper body clothing?: None Help from another person to put on and taking off regular lower body clothing?: A Lot 6 Click Score: 18   End of Session Equipment Utilized During Treatment: Gait belt;Rolling walker Nurse Communication: Mobility status  Activity Tolerance: Patient tolerated treatment well Patient left: in chair;with call bell/phone within reach;with chair alarm set  OT Visit Diagnosis: Other abnormalities of gait and mobility (R26.89);Unsteadiness on feet (R26.81)                Time: 1315-1340 OT Time Calculation (min): 25 min Charges:  OT General Charges $OT Visit: 1 Visit OT Evaluation $OT Eval Moderate Complexity: 1 Mod OT Treatments $Self Care/Home Management : 8-22 mins  Hulda Humphrey OTR/L Acute Rehabilitation Services Pager: [phone] Office: [phone]  [place] 08/23/2019, 3:41 PM

## 2019-08-24 DIAGNOSIS — D72819 Decreased white blood cell count, unspecified: Secondary | ICD-10-CM

## 2019-08-24 LAB — BASIC METABOLIC PANEL
Anion gap: 9 (ref 5–15)
Anion gap: 8 (ref 5–15)
BUN: 16 mg/dL (ref 8–23)
BUN: 16 mg/dL (ref 8–23)
CO2: 24 mmol/L (ref 22–32)
CO2: 24 mmol/L (ref 22–32)
Calcium: 6.6 mg/dL — ABNORMAL LOW (ref 8.9–10.3)
Calcium: 7 mg/dL — ABNORMAL LOW (ref 8.9–10.3)
Chloride: 98 mmol/L (ref 98–111)
Chloride: 100 mmol/L (ref 98–111)
Creatinine, Ser: 0.6 mg/dL — ABNORMAL LOW (ref 0.61–1.24)
Creatinine, Ser: 0.66 mg/dL (ref 0.61–1.24)
GFR calc Af Amer: >60 mL/min (ref >60)
GFR calc Af Amer: >60 mL/min (ref >60)
GFR calc non Af Amer: >60 mL/min (ref >60)
GFR calc non Af Amer: >60 mL/min (ref >60)
Glucose, Bld: 126 mg/dL — ABNORMAL HIGH (ref 70–99)
Glucose, Bld: 113 mg/dL — ABNORMAL HIGH (ref 70–99)
Potassium: 2.5 mmol/L — CL (ref 3.5–5.1)
Potassium: 3.7 mmol/L (ref 3.5–5.1)
Sodium: 131 mmol/L — ABNORMAL LOW (ref 135–145)
Sodium: 132 mmol/L — ABNORMAL LOW (ref 135–145)

## 2019-08-24 LAB — CBC WITH DIFFERENTIAL/PLATELET
Abs Immature Granulocytes: 0.02 10*3/uL (ref 0.00–0.07)
Basophils Absolute: 0 10*3/uL (ref 0.0–0.1)
Basophils Relative: 1 %
Eosinophils Absolute: 0.1 10*3/uL (ref 0.0–0.5)
Eosinophils Relative: 1 %
HCT: 25.5 % — ABNORMAL LOW (ref 39.0–52.0)
Hemoglobin: 8.8 g/dL — ABNORMAL LOW (ref 13.0–17.0)
Immature Granulocytes: 1 %
Lymphocytes Relative: 13 %
Lymphs Abs: 0.5 10*3/uL — ABNORMAL LOW (ref 0.7–4.0)
MCH: 33.6 pg (ref 26.0–34.0)
MCHC: 34.5 g/dL (ref 30.0–36.0)
MCV: 97.3 fL (ref 80.0–100.0)
Monocytes Absolute: 0.5 10*3/uL (ref 0.1–1.0)
Monocytes Relative: 15 %
Neutro Abs: 2.4 10*3/uL (ref 1.7–7.7)
Neutrophils Relative %: 69 %
Platelets: 38 10*3/uL — ABNORMAL LOW (ref 150–400)
RBC: 2.62 MIL/uL — ABNORMAL LOW (ref 4.22–5.81)
RDW: 12.4 % (ref 11.5–15.5)
WBC: 3.5 10*3/uL — ABNORMAL LOW (ref 4.0–10.5)
nRBC: 0 % (ref 0.0–0.2)

## 2019-08-24 LAB — IRON AND TIBC
Iron: 26 ug/dL — ABNORMAL LOW (ref 45–182)
Saturation Ratios: 18 % (ref 17.9–39.5)
TIBC: 148 ug/dL — ABNORMAL LOW (ref 250–450)
UIBC: 122 ug/dL

## 2019-08-24 LAB — TRANSFERRIN: Transferrin: 106 mg/dL — ABNORMAL LOW (ref 180–329)

## 2019-08-24 LAB — FERRITIN: Ferritin: 412 ng/mL — ABNORMAL HIGH (ref 24–336)

## 2019-08-24 LAB — MAGNESIUM: Magnesium: 2.3 mg/dL (ref 1.7–2.4)

## 2019-08-24 LAB — VITAMIN B12: Vitamin B-12: 222 pg/mL (ref 180–914)

## 2019-08-24 MED ORDER — VITAMIN B-12 1000 MCG PO TABS
1000.0000 ug | ORAL_TABLET | Freq: Every day | ORAL | Status: DC
  Administered 2019-08-24 – 2019-08-27 (×4): 1000 ug via ORAL
  Filled 2019-08-24 (×4): qty 1

## 2019-08-24 MED ORDER — DIPHENHYDRAMINE HCL 50 MG/ML IJ SOLN
25.0000 mg | Freq: Once | INTRAMUSCULAR | Status: AC
  Administered 2019-08-25: 25 mg via INTRAVENOUS
  Filled 2019-08-24: qty 1

## 2019-08-24 MED ORDER — POTASSIUM CHLORIDE 10 MEQ/100ML IV SOLN
10.0000 meq | INTRAVENOUS | Status: AC
  Administered 2019-08-24 (×2): 10 meq via INTRAVENOUS
  Filled 2019-08-24 (×2): qty 100

## 2019-08-24 MED ORDER — POTASSIUM CHLORIDE CRYS ER 20 MEQ PO TBCR
40.0000 meq | EXTENDED_RELEASE_TABLET | ORAL | Status: AC
  Administered 2019-08-24 (×2): 40 meq via ORAL
  Filled 2019-08-24 (×2): qty 2

## 2019-08-24 NOTE — Progress Notes (Signed)
PROGRESS NOTE  Corey Oneill  DOB: Aug 12, 1936  PCP: [name], MD HFW:[id number]  DOA: 08/21/2019  LOS: 3 days   Brief narrative: Corey Oneill is a 83 y.o. male with PMH of aortic stenosis s/p TAVR, CAD, chronic diastolic CHF, ITP, prostate cancer, radiation proctitis, HLD, depression, GERD. At his baseline ambulates with assistance, independent for most of his ADL;  manages his medication on his own. March 2014 - prostate cancer diagnosed. Follows up with Dr. 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 April 2020 -started on enzalutamide September 2020 -patient presented with urine retention.  A Foley catheter could not be placed.  He underwent a suprapubic catheter placement by IR. 10/2 -patient saw urologist in the office. 10/5 -patient underwent urethral stricture dilation, suprapubic tube removal and a urethral Foley catheter placement.  He was discharged home on oral Keflex. 10/8-patient presented to the ED with complaint of fever and chills for 24 hours.  In the ED, patient had a temperature of 103.3. Chest x-ray unremarkable. COVID-19 negative. Urinalysis showed cloudy urine with a small amount of leukocytes and rare bacteria Patient was admitted to hospitalist service for UTI sepsis. Blood culture as well as urine culture obtained on admission is showing growth of Enterococcus faecalis.  Subjective: Patient was seen and examined this morning.  Pleasant elderly Caucasian male.  Sitting up in chair.  Wants to go home.  But after conversation, patient understands the reason to stay in the hospital. Wife not at bedside today.    Assessment/Plan: Sepsis due to UTI -POA UTI and bacteremia with Enterococcus faecalis -associated with recent instrumentation and Foley catheter placement. -Met sepsis criteria on admission. -Urinalysis showed cloudy urine with a small amount of leukocytes and  rare bacteria -IV cefepime was started empirically. -Blood culture as well as urine culture obtained on admission is showing growth of Enterococcus faecalis. -Infectious disease consult appreciated.  Antibiotic switched to IV ampicillin.   -TTE did not show any vegetation.  Defer to ID for any need of TEE. -Fever trending down.  WBC remains normal.   -Repeat blood culture sent on 10/10.  Continue to follow.  A. fib with RVR -No history of A. fib in the past.  Patient had a brief episode of A. fib with RVR on 10/9, spontaneously converted after home dose of Coreg with given.  Likely situational A. fib due to sepsis.  I do not think he would need any anticoagulation unless A. fib rate clears spontaneously. -Continue to monitor in telemetry.  Cardiovascular issues: CAD, chronic diastolic CHF, hypertension, HLD, aortic stenosis s/p TAVR -Home meds include aspirin, Coreg, Imdur, Crestor. -All resumed.  Blood pressure remains in normal range. -Continue gentle hydration.  Monitor for volume overload. -Patient is not on any diuretic does not appear to be volume overloaded right now. -Last echo from 2019 with EF more than 60%.  Hypokalemia -Potassium level is low at 2.5 today.  Unclear reason. -Patient was also having episodes of NSVTs in telemetry monitoring last night. -Ordered for a total of 100 mEq of potassium replacement today. -I will order for repeat BMP this afternoon.  Mood disorder. -Continue Cymbalta from home.  Prostate cancer, Stage IV, castration resistance -Patient is on Xtandi. -Recently underwent dilatation of urethral stricture conversion of suprapubic catheter to urethral Foley catheter.  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 Patient is on methotrexate. Monitor.  Follows up with [facility].  Chronic  ITP. Platelet count chronically low, 51 on last check on 10/9.  Repeat blood work Architectural technologist. Patient also has chronic macrocytosis as well as anemia which  is unchanged for now  Monitor.  Chronic anemia -Macrocytosis, MCV 101.5 -Hemoglobin only slightly low at baseline.  Hemoglobin 8.8 today. -Iron profile shows serum iron level elevated to 412, low normal vitamin B12 level. -I will start on oral vitamin B12 supplement.  Mobility: PT eval ordered. DVT prophylaxis:  SCDs Code Status:   Code Status: Full Code  Family Communication:  Wife at bedside Expected Discharge:  Anticipate home in the next 2 to 3 days.  Consultants:  Infectious disease  Procedures:    Antimicrobials: Anti-infectives (From admission, onward)   Start     Dose/Rate Route Frequency Ordered Stop   08/22/19 0515  ampicillin (OMNIPEN) 2 g in sodium chloride 0.9 % 100 mL IVPB     2 g 300 mL/hr over 20 Minutes Intravenous Every 4 hours 08/22/19 0501     08/21/19 1800  ceFEPIme (MAXIPIME) 2 g in sodium chloride 0.9 % 100 mL IVPB  Status:  Discontinued     2 g 200 mL/hr over 30 Minutes Intravenous Every 8 hours 08/21/19 1514 08/22/19 0500   08/21/19 1030  ceFEPIme (MAXIPIME) 2 g in sodium chloride 0.9 % 100 mL IVPB     2 g 200 mL/hr over 30 Minutes Intravenous  Once 08/21/19 1015 08/21/19 1134      Diet Order            Diet regular Room service appropriate? Yes; Fluid consistency: Thin  Diet effective now              Infusions:  . ampicillin (OMNIPEN) IV 2 g (08/24/19 0824)  . lactated ringers Stopped (08/22/19 0256)  . potassium chloride 10 mEq (08/24/19 1005)    Scheduled Meds: . aspirin EC  81 mg Oral QHS  . carvedilol  3.125 mg Oral BID WC  . Chlorhexidine Gluconate Cloth  6 each Topical Daily  . docusate sodium  100 mg Oral BID  . DULoxetine  30 mg Oral Daily  . isosorbide mononitrate  30 mg Oral Daily  . rosuvastatin  10 mg Oral Daily  . sodium chloride flush  3 mL Intravenous Q12H  . tamsulosin  0.4 mg Oral Daily  . vitamin B-12  1,000 mcg Oral Daily    PRN meds: acetaminophen **OR** acetaminophen, HYDROcodone-acetaminophen,  ondansetron **OR** ondansetron (ZOFRAN) IV   Objective: Vitals:   08/23/19 2015 08/24/19 0501  BP: 123/79 136/63  Pulse: 73 71  Resp: 20 18  Temp: 97.9 F (36.6 C) 97.8 F (36.6 C)  SpO2: 95% 94%    Intake/Output Summary (Last 24 hours) at 08/24/2019 1057 Last data filed at 08/24/2019 0506 Gross per 24 hour  Intake 120 ml  Output 900 ml  Net -780 ml   Filed Weights   08/21/19 1624 08/24/19 0506  Weight: 68 kg 71.3 kg   Weight change:  Body mass index is 22.55 kg/m.   Physical Exam: General exam: Appears calm and comfortable.  Has a Foley catheter on. Skin: No rashes, lesions or ulcers. HEENT: Atraumatic, normocephalic, supple neck, no obvious bleeding Lungs: Clear to auscultation bilaterally CVS: Regular rate and rhythm, no murmur GI/Abd soft, nontender, non-distended, bowel sound present CNS: Alert, awake, able to answer simple questions.  Able to follow commands.   Psychiatry: Mood appropriate Extremities: No pedal edema, no calf tenderness  Data Review: I have personally reviewed the  laboratory data and studies available.  Recent Labs  Lab 08/21/19 1012 08/22/19 0443 08/24/19 0518  WBC 10.3 4.8 3.5*  NEUTROABS 9.6*  --  2.4  HGB 10.0* 9.2* 8.8*  HCT 30.0* 27.4* 25.5*  MCV 102.0* 101.5* 97.3  PLT 83* 51* 38*   Recent Labs  Lab 08/21/19 1012 08/22/19 0443 08/24/19 0518  NA 135 134* 131*  K 3.0* 3.4* 2.5*  CL 101 102 98  CO2 _0 GLUCOSE 164* 113* 126*  BUN _1 CREATININE 0.78 0.82 0.60*  CALCIUM 7.6* 7.1* 6.6*  MG  --   --  2.3    Terrilee Croak, MD  [facility] 08/24/2019

## 2019-08-24 NOTE — Progress Notes (Signed)
[facility] for Infectious Disease   Reason for visit: Follow up on bacteremia  Interval History: repeat blood cultures with ngtd; no new complaints, remains on ampicillin.  TTE without vegetation.  Remains afebrile.  No associated rash or diarrhea.    Physical Exam: Constitutional:  Vitals:   08/23/19 2015 08/24/19 0501  BP: 123/79 136/63  Pulse: 73 71  Resp: 20 18  Temp: 97.9 F (36.6 C) 97.8 F (36.6 C)  SpO2: 95% 94%   patient appears in NAD Eyes: anicteric HENT: no thrush Respiratory: Normal respiratory effort; CTA B Cardiovascular: RRR GI: soft, nt, nd  Review of Systems: Constitutional: negative for fevers, chills and anorexia Respiratory: negative for cough or sputum Gastrointestinal: negative for diarrhea Integument/breast: negative for rash  Lab Results  Component Value Date   WBC 3.5 (L) 08/24/2019   HGB 8.8 (L) 08/24/2019   HCT 25.5 (L) 08/24/2019   MCV 97.3 08/24/2019   PLT 38 (L) 08/24/2019    Lab Results  Component Value Date   CREATININE 0.60 (L) 08/24/2019   BUN 16 08/24/2019   NA 131 (L) 08/24/2019   K 2.5 (LL) 08/24/2019   CL 98 08/24/2019   CO2 24 08/24/2019    Lab Results  Component Value Date   ALT 13 08/22/2019   AST 23 08/22/2019   ALKPHOS 49 08/22/2019     Microbiology: Recent Results (from the past 240 hour(s))  Blood Culture (routine x 2)     Status: Abnormal   Collection Time: 08/21/19 10:12 AM   Specimen: BLOOD  Result Value Ref Range Status   Specimen Description   Final    BLOOD RIGHT ANTECUBITAL Performed at [facility], [place] [address] [place], [place] [address]    Special Requests   Final    BOTTLES DRAWN AEROBIC AND ANAEROBIC Blood Culture results may not be optimal due to an inadequate volume of blood received in culture bottles Performed at [place] [address] [place], [place] [address]    Culture  Setup Time   Final    IN BOTH AEROBIC AND ANAEROBIC  BOTTLES GRAM POSITIVE COCCI CRITICAL RESULT CALLED TO, READ BACK BY AND VERIFIED WITH: Lavell Luster [facility] 08/22/19 0128 JDW Performed at [facility], [place] [address], [place], [place] [address]    Culture ENTEROCOCCUS FAECALIS (A)  Final   Report Status 08/23/2019 FINAL  Final   Organism ID, Bacteria ENTEROCOCCUS FAECALIS  Final      Susceptibility   Enterococcus faecalis - MIC*    AMPICILLIN <=2 SENSITIVE Sensitive     VANCOMYCIN 1 SENSITIVE Sensitive     GENTAMICIN SYNERGY SENSITIVE Sensitive     * ENTEROCOCCUS FAECALIS  Urine culture     Status: None   Collection Time: 08/21/19 10:12 AM   Specimen: In/Out Cath Urine  Result Value Ref Range Status   Specimen Description   Final    IN/OUT CATH URINE Performed at [facility], [place] [address] [place], [place] [address]    Special Requests   Final    NONE Performed at [facility], [place] [address], [place], [place] [address]    Culture   Final    NO GROWTH Performed at [facility], [place] [address], [place],  [address]    Report Status 08/22/2019 FINAL  Final  Blood Culture ID Panel (Reflexed)     Status: Abnormal   Collection Time: 08/21/19 10:12 AM  Result Value Ref Range Status   Enterococcus species DETECTED (A) NOT DETECTED Final    Comment: CRITICAL RESULT CALLED TO, READ BACK BY AND VERIFIED WITH: J GRIMSLEY PHARMD 08/22/19 0128 JDW    Vancomycin resistance NOT DETECTED NOT DETECTED Final   Listeria monocytogenes NOT DETECTED NOT DETECTED Final   Staphylococcus species NOT DETECTED NOT DETECTED Final   Staphylococcus aureus (BCID) NOT DETECTED NOT DETECTED Final   Streptococcus species NOT DETECTED NOT DETECTED Final   Streptococcus agalactiae NOT DETECTED NOT DETECTED Final   Streptococcus pneumoniae NOT DETECTED NOT DETECTED Final   Streptococcus pyogenes NOT DETECTED NOT DETECTED Final   Acinetobacter baumannii NOT DETECTED NOT DETECTED Final    Enterobacteriaceae species NOT DETECTED NOT DETECTED Final   Enterobacter cloacae complex NOT DETECTED NOT DETECTED Final   Escherichia coli NOT DETECTED NOT DETECTED Final   Klebsiella oxytoca NOT DETECTED NOT DETECTED Final   Klebsiella pneumoniae NOT DETECTED NOT DETECTED Final   Proteus species NOT DETECTED NOT DETECTED Final   Serratia marcescens NOT DETECTED NOT DETECTED Final   Haemophilus influenzae NOT DETECTED NOT DETECTED Final   Neisseria meningitidis NOT DETECTED NOT DETECTED Final   Pseudomonas aeruginosa NOT DETECTED NOT DETECTED Final   Candida albicans NOT DETECTED NOT DETECTED Final   Candida glabrata NOT DETECTED NOT DETECTED Final   Candida krusei NOT DETECTED NOT DETECTED Final   Candida parapsilosis NOT DETECTED NOT DETECTED Final   Candida tropicalis NOT DETECTED NOT DETECTED Final    Comment: Performed at [facility], [address] [place], [place] [address]  Blood Culture (routine x 2)     Status: Abnormal   Collection Time: 08/21/19 10:17 AM   Specimen: BLOOD LEFT FOREARM  Result Value Ref Range Status   Specimen Description   Final    BLOOD LEFT FOREARM Performed at [facility], [place] [address] [place], [place] [address]    Special Requests   Final    BOTTLES DRAWN AEROBIC AND ANAEROBIC Blood Culture adequate volume Performed at [place] [address] [place], [place] [address]    Culture  Setup Time   Final    IN BOTH AEROBIC AND ANAEROBIC BOTTLES GRAM POSITIVE COCCI CRITICAL VALUE NOTED.  VALUE IS CONSISTENT WITH PREVIOUSLY REPORTED AND CALLED VALUE.    Culture (A)  Final    ENTEROCOCCUS FAECALIS SUSCEPTIBILITIES PERFORMED ON PREVIOUS CULTURE WITHIN THE LAST 5 DAYS. Performed at [facility], [place] [address] [place], [place] [address]    Report Status 08/23/2019 FINAL  Final  SARS Coronavirus 2 [facility] order, Performed in [facility] hospital lab) Nasopharyngeal Nasopharyngeal Swab     Status: None    Collection Time: 08/21/19 10:41 AM   Specimen: Nasopharyngeal Swab  Result Value Ref Range Status   SARS Coronavirus 2 NEGATIVE NEGATIVE Final    Comment: (NOTE) If result is NEGATIVE SARS-CoV-2 target nucleic acids are NOT DETECTED. The SARS-CoV-2 RNA is generally detectable in upper and lower  respiratory specimens during the acute phase of infection. The lowest  concentration of SARS-CoV-2 viral copies this assay can detect is 250  copies / mL. A negative result does not preclude SARS-CoV-2 infection  and should not be used as the sole basis for treatment or other  patient management decisions.  A negative result may occur with  improper specimen collection / handling, submission of specimen other  than nasopharyngeal swab, presence of viral mutation(s) within the  areas targeted by this assay,  and inadequate number of viral copies  (<250 copies / mL). A negative result must be combined with clinical  observations, patient history, and epidemiological information. If result is POSITIVE SARS-CoV-2 target nucleic acids are DETECTED. The SARS-CoV-2 RNA is generally detectable in upper and lower  respiratory specimens dur ing the acute phase of infection.  Positive  results are indicative of active infection with SARS-CoV-2.  Clinical  correlation with patient history and other diagnostic information is  necessary to determine patient infection status.  Positive results do  not rule out bacterial infection or co-infection with other viruses. If result is PRESUMPTIVE POSTIVE SARS-CoV-2 nucleic acids MAY BE PRESENT.   A presumptive positive result was obtained on the submitted specimen  and confirmed on repeat testing.  While 2019 novel coronavirus  (SARS-CoV-2) nucleic acids may be present in the submitted sample  additional confirmatory testing may be necessary for epidemiological  and / or clinical management purposes  to differentiate between  SARS-CoV-2 and other Sarbecovirus  currently known to infect humans.  If clinically indicated additional testing with an alternate test  methodology [phone]) is advised. The SARS-CoV-2 RNA is generally  detectable in upper and lower respiratory sp ecimens during the acute  phase of infection. The expected result is Negative. Fact Sheet for Patients:  StrictlyIdeas.no Fact Sheet for Healthcare Providers: BankingDealers.co.za This test is not yet approved or cleared by the [place] FDA and has been authorized for detection and/or diagnosis of SARS-CoV-2 by FDA under an Emergency Use Authorization (EUA).  This EUA will remain in effect (meaning this test can be used) for the duration of the COVID-19 declaration under Section 564(b)(1) of the Act, 21 U.S.C. section 360bbb-3(b)(1), unless the authorization is terminated or revoked sooner. Performed at [facility], [place] [address], [place], [place] [address]   Culture, blood (routine x 2)     Status: None (Preliminary result)   Collection Time: 08/23/19  5:43 AM   Specimen: BLOOD RIGHT ARM  Result Value Ref Range Status   Specimen Description   Final    BLOOD RIGHT ARM Performed at [facility], [address], [place], [place] [address]    Special Requests   Final    BOTTLES DRAWN AEROBIC AND ANAEROBIC Blood Culture adequate volume Performed at [place] [address], [place], [place] [address]    Culture PENDING  Incomplete   Report Status PENDING  Incomplete  Culture, blood (routine x 2)     Status: None (Preliminary result)   Collection Time: 08/23/19  5:43 AM   Specimen: BLOOD LEFT HAND  Result Value Ref Range Status   Specimen Description   Final    BLOOD LEFT HAND Performed at [facility], [place] [address] [place], [place] [address]    Special Requests   Final    BOTTLES DRAWN AEROBIC AND ANAEROBIC Blood Culture adequate volume Performed at [place] [address] [place], [place] [address]    Culture PENDING  Incomplete   Report Status PENDING  Incomplete    Impression/Plan:  1. Bacteremia - TTE without obvious vegetation.  He does need a TEE due to having a TAVR.   Repeat blood cultures remain ngtd.  2.  Medication monitoring - creat wnl/low.  No issues.    3.  Leukopenia - down to 3.5.  Baseline seems to be around 3 - 3.5.  Will continue to monitor.

## 2019-08-24 NOTE — Progress Notes (Signed)
Pt had a very large, incontinent, watery brown stool. MD Dahal aware.

## 2019-08-25 ENCOUNTER — Telehealth: Payer: Self-pay | Admitting: Cardiovascular Disease

## 2019-08-25 LAB — CBC WITH DIFFERENTIAL/PLATELET
Abs Immature Granulocytes: 0.05 10*3/uL (ref 0.00–0.07)
Basophils Absolute: 0 10*3/uL (ref 0.0–0.1)
Basophils Relative: 0 %
Eosinophils Absolute: 0.1 10*3/uL (ref 0.0–0.5)
Eosinophils Relative: 1 %
HCT: 26.1 % — ABNORMAL LOW (ref 39.0–52.0)
Hemoglobin: 8.8 g/dL — ABNORMAL LOW (ref 13.0–17.0)
Immature Granulocytes: 1 %
Lymphocytes Relative: 14 %
Lymphs Abs: 0.7 10*3/uL (ref 0.7–4.0)
MCH: 33.5 pg (ref 26.0–34.0)
MCHC: 33.7 g/dL (ref 30.0–36.0)
MCV: 99.2 fL (ref 80.0–100.0)
Monocytes Absolute: 0.8 10*3/uL (ref 0.1–1.0)
Monocytes Relative: 16 %
Neutro Abs: 3.6 10*3/uL (ref 1.7–7.7)
Neutrophils Relative %: 68 %
Platelets: 64 10*3/uL — ABNORMAL LOW (ref 150–400)
RBC: 2.63 MIL/uL — ABNORMAL LOW (ref 4.22–5.81)
RDW: 12.8 % (ref 11.5–15.5)
WBC: 5.3 10*3/uL (ref 4.0–10.5)
nRBC: 0 % (ref 0.0–0.2)

## 2019-08-25 LAB — BASIC METABOLIC PANEL
Anion gap: 9 (ref 5–15)
BUN: 14 mg/dL (ref 8–23)
CO2: 24 mmol/L (ref 22–32)
Calcium: 7.2 mg/dL — ABNORMAL LOW (ref 8.9–10.3)
Chloride: 103 mmol/L (ref 98–111)
Creatinine, Ser: 0.65 mg/dL (ref 0.61–1.24)
GFR calc Af Amer: >60 mL/min (ref >60)
GFR calc non Af Amer: >60 mL/min (ref >60)
Glucose, Bld: 103 mg/dL — ABNORMAL HIGH (ref 70–99)
Potassium: 3.5 mmol/L (ref 3.5–5.1)
Sodium: 136 mmol/L (ref 135–145)

## 2019-08-25 MED ORDER — SODIUM CHLORIDE 0.9 % IV SOLN: INTRAVENOUS | Status: DC

## 2019-08-25 NOTE — Telephone Encounter (Signed)
Pt wanted to make Dr. Burt Knack know that he is currently at [facility]. He is having a TEE-Test done tomorrow. He has been at [facility] since last Wednesday.   The patient is concerned because he does not know the doctor doing the TEE. He would like to go home as soon as possible.

## 2019-08-25 NOTE — Progress Notes (Signed)
[facility] transport to pick up at 0800 on 08-26-19 to transport to [place] for TEE

## 2019-08-25 NOTE — Progress Notes (Signed)
Physical Therapy Treatment Patient Details Name: Corey Oneill MRN: [id number] DOB: December 22, 1935 Today's Date: 08/25/2019    History of Present Illness Corey Oneill is a 83 y.o. male with PMH of aortic stenosis s/p TAVR, CAD, chronic diastolic CHF, ITP, prostate cancer, radiation proctitis, HLD, depression, GERD.10/5 -patient underwent urethral stricture dilation, suprapubic tube removal and a urethral Foley catheter placement.  He was discharged home on oral Keflex, returned 10/8 with increased fever and chills.    PT Comments    Pt feeling "tired" and worried about his procedure tomorrow.  TEE.  Pt asking about any activity restrictions and worried he won't be able to walk his dog as prior.  Assisted pt OOB only required increased time. General transfer comment: steady balance assist, vc for safe hand placement.  General Gait Details: started out with walker due to c/o "stiffness" and "weakness" from "laying around in bed all day".  Pt only required Supervision Assist with walker.  Good alternating gait with mild unsteadiness with turns.  Trial amb without any AD (as prior to admit pt walked his dog everyday) with slight increased unsteadiness and decreased gait speed.  Pt did not feel as confident.  Pt stated he has a cane at home but "never really used it".  Pt would like for [place] to order a walker for home to use until "I get my strength back". Pt plans to D/C back home with his spouse.   Follow Up Recommendations  Home health PT     Equipment Recommendations    Rolling Walker    Recommendations for Other Services       Precautions / Restrictions Precautions Precautions: None Restrictions Weight Bearing Restrictions: No    Mobility  Bed Mobility Overal bed mobility: Needs Assistance Bed Mobility: Supine to Sit     Supine to sit: Min guard;Supervision     General bed mobility comments: increased time  Transfers Overall transfer level: Needs assistance Equipment used:  Rolling walker (2 wheeled);None Transfers: Sit to/from Stand Sit to Stand: Supervision;Min guard         General transfer comment: steady balance assist, vc for safe hand placement  Ambulation/Gait Ambulation/Gait assistance: Min assist;Min guard;Supervision Gait Distance (Feet): 285 Feet Assistive device: Rolling walker (2 wheeled) Gait Pattern/deviations: Step-through pattern Gait velocity: decreased   General Gait Details: started out with walker due to c/o "stiffness" and "weakness" from "laying around in bed all day".  Pt only required Supervision Assist with walker.  Good alternating gait with mild unsteadiness with turns.  Trial amb without any AD (as prior to admit pt walked his dog everyday) with slight increased unsteadiness and decreased gait speed.  Pt did not feel as confident.  Pt stated he has a cane at home but "never really used it".  Pt would like for [place] to order a walker for home to use until "I get my strength back".   Stairs             Wheelchair Mobility    Modified Rankin (Stroke Patients Only)       Balance                                            Cognition Arousal/Alertness: Awake/alert Behavior During Therapy: WFL for tasks assessed/performed Overall Cognitive Status: Within Functional Limits for tasks assessed  Exercises      General Comments        Pertinent Vitals/Pain Faces Pain Scale: Hurts a little bit Pain Location: general "stiffness" Pain Intervention(s): Monitored during session;Repositioned    Home Living                      Prior Function            PT Goals (current goals can now be found in the care plan section) Progress towards PT goals: Progressing toward goals    Frequency    Min 3X/week      PT Plan Current plan remains appropriate    Co-evaluation              AM-PAC PT "6 Clicks" Mobility   Outcome  Measure  Help needed turning from your back to your side while in a flat bed without using bedrails?: A Little Help needed moving from lying on your back to sitting on the side of a flat bed without using bedrails?: A Little Help needed moving to and from a bed to a chair (including a wheelchair)?: A Little Help needed standing up from a chair using your arms (e.g., wheelchair or bedside chair)?: A Little Help needed to walk in hospital room?: A Little Help needed climbing 3-5 steps with a railing? : A Little 6 Click Score: 18    End of Session   Activity Tolerance: Patient tolerated treatment well Patient left: in chair;with chair alarm set;with call bell/phone within reach Nurse Communication: Mobility status PT Visit Diagnosis: Unsteadiness on feet (R26.81);Muscle weakness (generalized) (M62.81)     Time: 1435-1500 PT Time Calculation (min) (ACUTE ONLY): 25 min  Charges:  $Gait Training: 8-22 mins $Therapeutic Activity: 8-22 mins                     Rica Koyanagi  PTA Acute  Rehabilitation Services Pager      [phone] Office      [phone]

## 2019-08-25 NOTE — Telephone Encounter (Addendum)
Attempted to call the pt back but the phone rang multiple times with no answer or voicemail option.     Pt is having TEE with Dr. Debara Pickett 08/26/19.[name] was admitted 08/21/19 to [facility] for sepsis secondary to UTI.. Pt had episode of Afib but back in NSR.[name] Pt to have TEE per Infectious Dz to r/o vegetation...since TAVR pt. 2017  Will forward to Dr. Burt Knack but he is not back in hosp until 08/26/19.

## 2019-08-25 NOTE — Care Management Important Message (Signed)
Important Message  Patient Details IM Letter given to [name] SW to present to the Patient Name: Corey Oneill MRN: [id number] Date of Birth: 1936-10-23   Medicare Important Message Given:  Yes     Kerin Salen 08/25/2019, 1:36 PM

## 2019-08-25 NOTE — Progress Notes (Signed)
    [facility] has been requested to perform a transesophageal echocardiogram on 08/26/2019 for bacteremia.  After careful review of history and examination, the risks and benefits of transesophageal echocardiogram have been explained including risks of esophageal damage, perforation (1:10,000 risk), bleeding, pharyngeal hematoma as well as other potential complications associated with conscious sedation including aspiration, arrhythmia, respiratory failure and death. Alternatives to treatment were discussed, questions were answered. Patient is willing to proceed.   TEE scheduled for 08/26/2019 with Dr. Debara Pickett. Timing TBD  Roby Lofts, PA-C 08/25/2019 9:02 AM

## 2019-08-25 NOTE — Progress Notes (Signed)
Occupational Therapy Treatment Patient Details Name: YASUO PAFFORD MRN: [id number] DOB: [birth year] Today's Date: 08/25/2019    History of present illness GREGOIRE FAVINGER is a 83 y.o. male with PMH of aortic stenosis s/p TAVR, CAD, chronic diastolic CHF, ITP, prostate cancer, radiation proctitis, HLD, depression, GERD.10/5 -patient underwent urethral stricture dilation, suprapubic tube removal and a urethral Foley catheter placement.  He was discharged home on oral Keflex, returned 10/8 with increased fever and chills.   OT comments  This 83 yo male admitted with above presents to acute OT making progress with grooming, toileting, and LB ADLs. He will benefit from acute OT without need for follow up.  Follow Up Recommendations  Supervision/Assistance - 24 hour;No OT follow up    Equipment Recommendations  3 in 1 bedside commode       Precautions / Restrictions Precautions Precautions: None Restrictions Weight Bearing Restrictions: No       Mobility Bed Mobility Overal bed mobility: Needs Assistance Bed Mobility: Sit to Supine     Supine to sit: Min guard;Supervision Sit to supine: Supervision   General bed mobility comments: increased time  Transfers Overall transfer level: Needs assistance Equipment used: Rolling walker (2 wheeled) Transfers: Sit to/from Stand Sit to Stand: Supervision         General transfer comment: steady balance assist, vc for safe hand placement    Balance Overall balance assessment: Mild deficits observed, not formally tested                                         ADL either performed or assessed with clinical judgement   ADL Overall ADL's : Needs assistance/impaired     Grooming: Supervision/safety;Set up;Standing;Wash/dry hands;Oral care Grooming Details (indicate cue type and reason): sink level             Lower Body Dressing: Set up;Supervision/safety Lower Body Dressing Details (indicate cue type and  reason): S sit<>stand Toilet Transfer: Min guard;Ambulation;Grab bars;Comfort height toilet   Toileting- Clothing Manipulation and Hygiene: Supervision/safety;Sit to/from stand               Vision Patient Visual Report: No change from baseline            Cognition Arousal/Alertness: Awake/alert Behavior During Therapy: WFL for tasks assessed/performed Overall Cognitive Status: Within Functional Limits for tasks assessed                                                     Pertinent Vitals/ Pain       Pain Assessment: No/denies pain Faces Pain Scale: Hurts a little bit Pain Location: general "stiffness" Pain Intervention(s): Monitored during session;Repositioned         Frequency  Min 2X/week        Progress Toward Goals  OT Goals(current goals can now be found in the care plan section)  Progress towards OT goals: Progressing toward goals     Plan Discharge plan remains appropriate       AM-PAC OT "6 Clicks" Daily Activity     Outcome Measure   Help from another person eating meals?: None Help from another person taking care of personal grooming?: A Little Help from another person toileting, which includes using toliet, bedpan,  or urinal?: A Little Help from another person bathing (including washing, rinsing, drying)?: A Little Help from another person to put on and taking off regular upper body clothing?: A Little Help from another person to put on and taking off regular lower body clothing?: A Little 6 Click Score: 19    End of Session Equipment Utilized During Treatment: Gait belt;Rolling walker  OT Visit Diagnosis: Other abnormalities of gait and mobility (R26.89);Unsteadiness on feet (R26.81)   Activity Tolerance Patient tolerated treatment well   Patient Left in bed;with call bell/phone within reach;with bed alarm set   Nurse Communication          Time: [id number] OT Time Calculation (min): 32 min  Charges: OT  General Charges $OT Visit: 1 Visit OT Treatments $Self Care/Home Management : 23-37 mins  [place], OTR/L Acute [employer] Pager [phone] Office [phone]      Almon Register 08/25/2019, 5:22 PM

## 2019-08-25 NOTE — Progress Notes (Addendum)
PROGRESS NOTE  Corey Oneill  DOB: 1936/02/02  PCP: [name], MD JOI:[id number]  DOA: 08/21/2019  LOS: 4 days   Brief narrative: Corey Oneill is a 83 y.o. male with PMH of aortic stenosis s/p TAVR, CAD, chronic diastolic CHF, ITP, prostate cancer, radiation proctitis, HLD, depression, GERD. At his baseline ambulates with assistance, independent for most of his ADL;  manages his medication on his own. March 2014 - prostate cancer diagnosed. Follows up with Dr. 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 April 2020 -started on enzalutamide September 2020 -patient presented with urine retention.  A Foley catheter could not be placed.  He underwent a suprapubic catheter placement by IR. 10/2 -patient saw urologist in the office. 10/5 -patient underwent urethral stricture dilation, suprapubic tube removal and a urethral Foley catheter placement.  He was discharged home on oral Keflex. 10/8-patient presented to the ED with complaint of fever and chills for 24 hours.  In the ED, patient had a temperature of 103.3. Chest x-ray unremarkable. COVID-19 negative. Urinalysis showed cloudy urine with a small amount of leukocytes and rare bacteria Patient was admitted to hospitalist service for UTI sepsis. Blood culture as well as urine culture obtained on admission is showing growth of Enterococcus faecalis.  Subjective: Patient was seen and examined this morning.  Pleasant elderly Caucasian male.  Sitting up in chair.  Not in distress.  Patient had a big watery bowel movement yesterday.  Not since then.  No fever. Potassium level improved to 3.5 today after heavy replacement yesterday.  Assessment/Plan: Sepsis due to UTI -POA UTI and bacteremia with Enterococcus faecalis -associated with recent instrumentation and Foley catheter placement. -Met sepsis criteria on admission. -Blood culture as well as  urine culture obtained on admission is showing growth of Enterococcus faecalis. -Infectious disease consult appreciated.  Antibiotic adjusted to IV ampicillin.   -TTE did not show any vegetation.  Has a TAVR. planned for TEE tomorrow -Fever trending down.  WBC remains normal.   -Repeat blood culture sent on 10/10 has not shown any growth so far.  A. fib with RVR -No history of A. fib in the past.  Patient had a brief episode of A. fib with RVR on 10/9, spontaneously converted after home dose of Coreg with given.  Likely situational A. fib due to sepsis.  I do not think he would need any anticoagulation unless A. fib rate clears spontaneously. -Continue to monitor in telemetry.  Cardiovascular issues: CAD, chronic diastolic CHF, hypertension, HLD, aortic stenosis s/p TAVR -Home meds include Coreg, Imdur, aspirin, Crestor. -All resumed.  Blood pressure remains in normal range. -Continue gentle hydration.  Monitor for volume overload. -Blood pressure in normal range. -Patient is not on any diuretic does not appear to be volume overloaded right now.  -TTE 10/9 showed EF 60 to 65% and mild increased LVH.  Hypokalemia -Potassium level is low at 2.5 yesterday.  Unclear reason. -Patient was also having episodes of NSVTs in telemetry monitoring last night. -Aggressively replaced.  Potassium level improved to 3.5 today.  Continue to monitor.  Mood disorder. -Continue Cymbalta from home.  Prostate cancer, Stage IV, castration resistance -Patient is on Xtandi. -Recently underwent dilatation of urethral stricture conversion of suprapubic catheter to urethral Foley catheter. -I discussed on the phone with Dr. Junious Silk this morning.  Recommends to continue the same Foley catheter till next office visit.  He gave me an appointment for the  patient on 09/03/2019 at 9 AM.  We will call a formal consult if any other urological symptoms would occur.  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 Patient  is on methotrexate. Monitor.  Follows up with [facility].  Chronic ITP. Platelet count chronically low, 64 today.   Chronic anemia -Macrocytosis, MCV 101.5 -Hemoglobin only slightly low at baseline.  Hemoglobin 8.8 today. -Iron profile shows serum iron level elevated to 412, low normal vitamin B12 level. -also started on oral vitamin B12 supplement.  Mobility: PT eval ordered. DVT prophylaxis:  SCDs Code Status:   Code Status: Full Code  Family Communication:  Wife at bedside Expected Discharge:  Anticipate home in the next 2 to 3 days.  Consultants:  Infectious disease  Procedures:    Antimicrobials: Anti-infectives (From admission, onward)   Start     Dose/Rate Route Frequency Ordered Stop   08/22/19 0515  ampicillin (OMNIPEN) 2 g in sodium chloride 0.9 % 100 mL IVPB     2 g 300 mL/hr over 20 Minutes Intravenous Every 4 hours 08/22/19 0501     08/21/19 1800  ceFEPIme (MAXIPIME) 2 g in sodium chloride 0.9 % 100 mL IVPB  Status:  Discontinued     2 g 200 mL/hr over 30 Minutes Intravenous Every 8 hours 08/21/19 1514 08/22/19 0500   08/21/19 1030  ceFEPIme (MAXIPIME) 2 g in sodium chloride 0.9 % 100 mL IVPB     2 g 200 mL/hr over 30 Minutes Intravenous  Once 08/21/19 1015 08/21/19 1134      Diet Order            Diet Heart Room service appropriate? Yes; Fluid consistency: Thin  Diet effective now              Infusions:  . ampicillin (OMNIPEN) IV 2 g (08/25/19 0908)  . lactated ringers Stopped (08/22/19 0256)    Scheduled Meds: . aspirin EC  81 mg Oral QHS  . carvedilol  3.125 mg Oral BID WC  . Chlorhexidine Gluconate Cloth  6 each Topical Daily  . docusate sodium  100 mg Oral BID  . DULoxetine  30 mg Oral Daily  . isosorbide mononitrate  30 mg Oral Daily  . rosuvastatin  10 mg Oral Daily  . sodium chloride flush  3 mL Intravenous Q12H  . tamsulosin  0.4 mg Oral Daily  . vitamin B-12  1,000 mcg Oral Daily    PRN meds: acetaminophen **OR**  acetaminophen, HYDROcodone-acetaminophen, ondansetron **OR** ondansetron (ZOFRAN) IV   Objective: Vitals:   08/24/19 2050 08/25/19 0438  BP: 121/69 132/69  Pulse: 77 89  Resp: 18 20  Temp: 98.8 F (37.1 C) 98.8 F (37.1 C)  SpO2: 94% 93%    Intake/Output Summary (Last 24 hours) at 08/25/2019 1024 Last data filed at 08/25/2019 0500 Gross per 24 hour  Intake 480 ml  Output 2600 ml  Net -2120 ml   Filed Weights   08/21/19 1624 08/24/19 0506 08/25/19 0656  Weight: 68 kg 71.3 kg 70.6 kg   Weight change: -0.7 kg Body mass index is 22.33 kg/m.   Physical Exam: General exam: Appears calm and comfortable.  Has a Foley catheter on. Skin: No rashes, lesions or ulcers. HEENT: Atraumatic, normocephalic, supple neck, no obvious bleeding Lungs: Clear to auscultation bilaterally CVS: Regular rate and rhythm, no murmur GI/Abd soft, nontender, non-distended, bowel sound present CNS: Alert, awake, able to answer simple questions. Able to follow commands.   Psychiatry: Mood appropriate Extremities: No pedal  edema, no calf tenderness  Data Review: I have personally reviewed the laboratory data and studies available.  Recent Labs  Lab 08/21/19 1012 08/22/19 0443 08/24/19 0518 08/25/19 0501  WBC 10.3 4.8 3.5* 5.3  NEUTROABS 9.6*  --  2.4 3.6  HGB 10.0* 9.2* 8.8* 8.8*  HCT 30.0* 27.4* 25.5* 26.1*  MCV 102.0* 101.5* 97.3 99.2  PLT 83* 51* 38* 64*   Recent Labs  Lab 08/21/19 1012 08/22/19 0443 08/24/19 0518 08/24/19 1353 08/25/19 0501  NA 135 134* 131* 132* 136  K 3.0* 3.4* 2.5* 3.7 3.5  CL 101 102 98 100 103  CO2 _0 GLUCOSE 164* 113* 126* 113* 103*  BUN _1 CREATININE 0.78 0.82 0.60* 0.66 0.65  CALCIUM 7.6* 7.1* 6.6* 7.0* 7.2*  MG  --   --  2.3  --   --     Terrilee Croak, MD  [facility] 08/25/2019

## 2019-08-26 ENCOUNTER — Inpatient Hospital Stay: Payer: Self-pay

## 2019-08-26 ENCOUNTER — Inpatient Hospital Stay (HOSPITAL_COMMUNITY): Payer: Medicare Other

## 2019-08-26 ENCOUNTER — Encounter (HOSPITAL_COMMUNITY)
Admission: EM | Disposition: A | Discharge status: Home / Self Care | Payer: Self-pay | Source: Home / Self Care | Attending: Internal Medicine

## 2019-08-26 ENCOUNTER — Encounter (HOSPITAL_COMMUNITY): Payer: Self-pay | Admitting: *Deleted

## 2019-08-26 DIAGNOSIS — D696 Thrombocytopenia, unspecified: Secondary | ICD-10-CM

## 2019-08-26 DIAGNOSIS — I38 Endocarditis, valve unspecified: Secondary | ICD-10-CM

## 2019-08-26 HISTORY — PX: TEE WITHOUT CARDIOVERSION: SHX5443

## 2019-08-26 LAB — CBC WITH DIFFERENTIAL/PLATELET
Abs Immature Granulocytes: 0.06 10*3/uL (ref 0.00–0.07)
Basophils Absolute: 0 10*3/uL (ref 0.0–0.1)
Basophils Relative: 0 %
Eosinophils Absolute: 0.1 10*3/uL (ref 0.0–0.5)
Eosinophils Relative: 1 %
HCT: 26.6 % — ABNORMAL LOW (ref 39.0–52.0)
Hemoglobin: 9 g/dL — ABNORMAL LOW (ref 13.0–17.0)
Immature Granulocytes: 1 %
Lymphocytes Relative: 14 %
Lymphs Abs: 0.9 10*3/uL (ref 0.7–4.0)
MCH: 33.5 pg (ref 26.0–34.0)
MCHC: 33.8 g/dL (ref 30.0–36.0)
MCV: 98.9 fL (ref 80.0–100.0)
Monocytes Absolute: 0.8 10*3/uL (ref 0.1–1.0)
Monocytes Relative: 13 %
Neutro Abs: 4.3 10*3/uL (ref 1.7–7.7)
Neutrophils Relative %: 71 %
Platelets: 92 10*3/uL — ABNORMAL LOW (ref 150–400)
RBC: 2.69 MIL/uL — ABNORMAL LOW (ref 4.22–5.81)
RDW: 12.7 % (ref 11.5–15.5)
WBC: 6.2 10*3/uL (ref 4.0–10.5)
nRBC: 0 % (ref 0.0–0.2)

## 2019-08-26 LAB — BASIC METABOLIC PANEL
Anion gap: 8 (ref 5–15)
BUN: 11 mg/dL (ref 8–23)
CO2: 24 mmol/L (ref 22–32)
Calcium: 7.3 mg/dL — ABNORMAL LOW (ref 8.9–10.3)
Chloride: 103 mmol/L (ref 98–111)
Creatinine, Ser: 0.66 mg/dL (ref 0.61–1.24)
GFR calc Af Amer: >60 mL/min (ref >60)
GFR calc non Af Amer: >60 mL/min (ref >60)
Glucose, Bld: 105 mg/dL — ABNORMAL HIGH (ref 70–99)
Potassium: 3.3 mmol/L — ABNORMAL LOW (ref 3.5–5.1)
Sodium: 135 mmol/L (ref 135–145)

## 2019-08-26 SURGERY — ECHOCARDIOGRAM, TRANSESOPHAGEAL
Anesthesia: Moderate Sedation

## 2019-08-26 MED ORDER — FENTANYL CITRATE (PF) 100 MCG/2ML IJ SOLN
INTRAMUSCULAR | Status: DC | PRN
  Administered 2019-08-26 (×2): 25 ug via INTRAVENOUS

## 2019-08-26 MED ORDER — FENTANYL CITRATE (PF) 100 MCG/2ML IJ SOLN
INTRAMUSCULAR | Status: AC
  Filled 2019-08-26: qty 2

## 2019-08-26 MED ORDER — AMPICILLIN IV (FOR PTA / DISCHARGE USE ONLY): 12.0000 g | INTRAVENOUS | 0 refills | Status: AC

## 2019-08-26 MED ORDER — LIDOCAINE VISCOUS HCL 2 % MT SOLN
OROMUCOSAL | Status: DC | PRN
  Administered 2019-08-26: 20 mL via OROMUCOSAL

## 2019-08-26 MED ORDER — MIDAZOLAM HCL (PF) 5 MG/ML IJ SOLN
INTRAMUSCULAR | Status: AC
  Filled 2019-08-26: qty 2

## 2019-08-26 MED ORDER — BUTAMBEN-TETRACAINE-BENZOCAINE 2-2-14 % EX AERO
INHALATION_SPRAY | CUTANEOUS | Status: DC | PRN
  Administered 2019-08-26: 2 via TOPICAL

## 2019-08-26 MED ORDER — LIDOCAINE VISCOUS HCL 2 % MT SOLN
OROMUCOSAL | Status: AC
  Filled 2019-08-26: qty 15

## 2019-08-26 MED ORDER — POTASSIUM CHLORIDE CRYS ER 20 MEQ PO TBCR
40.0000 meq | EXTENDED_RELEASE_TABLET | Freq: Once | ORAL | Status: AC
  Administered 2019-08-26: 40 meq via ORAL
  Filled 2019-08-26: qty 2

## 2019-08-26 MED ORDER — MIDAZOLAM HCL (PF) 10 MG/2ML IJ SOLN
INTRAMUSCULAR | Status: DC | PRN
  Administered 2019-08-26: 2 mg via INTRAVENOUS
  Administered 2019-08-26 (×2): 1 mg via INTRAVENOUS

## 2019-08-26 NOTE — H&P (Signed)
   INTERVAL PROCEDURE H&P  History and Physical Interval Note:  08/26/2019 8:44 AM  Corey Oneill has presented today for their planned procedure. The various methods of treatment have been discussed with the patient and family. After consideration of risks, benefits and other options for treatment, the patient has consented to the procedure.  The patients' outpatient history has been reviewed, patient examined, and no change in status from most recent office note within the past 30 days. I have reviewed the patients' chart and labs and will proceed as planned. Questions were answered to the patient's satisfaction.   Pixie Casino, MD, [facility], [place] Director of the Advanced Lipid Disorders &  Cardiovascular Risk Reduction Clinic Diplomate of the American Board of Clinical Lipidology Attending Cardiologist  Direct Dial: [phone]  Fax: [phone]  Website:  [email or web address] 08/26/2019, 8:44 AM

## 2019-08-26 NOTE — CV Procedure (Addendum)
   TRANSESOPHAGEAL ECHOCARDIOGRAM (TEE) NOTE  INDICATIONS: infective endocarditis  PROCEDURE:   Informed consent was obtained prior to the procedure. The risks, benefits and alternatives for the procedure were discussed and the patient comprehended these risks.  Risks include, but are not limited to, cough, sore throat, vomiting, nausea, somnolence, esophageal and stomach trauma or perforation, bleeding, low blood pressure, aspiration, pneumonia, infection, trauma to the teeth and death.    After a procedural time-out, the patient was given 4 mg versed and 50 mcg fentanyl for moderate sedation.  The patient's heart rate, blood pressure, and oxygen saturation are monitored continuously during the procedure.The oropharynx was anesthetized 10 cc of topical 1% viscous lidocaine and 2 cetacaine sprays.  The transesophageal probe was inserted in the esophagus and stomach without difficulty and multiple views were obtained.  The patient was kept under observation until the patient left the procedure room.  The period of conscious sedation is 14 minutes, of which I was present face-to-face 100% of this time. The patient left the procedure room in stable condition.   Agitated microbubble saline contrast was not administered.  COMPLICATIONS:    There were no immediate complications.  Findings:  Non-diagnostic TEE- the esophagus was intubated without significant difficulty or resistance, however, the heart was poorly visualized, the probe was inserted and withdrawn to multiple levels without acceptable images to interpret. The patient did not become hypoxic or cough during the procedure. The probe was completely withdrawn and re-inserted, again without difficulty, however, diagnostic images were not obtainable. The study was then discontinued. The patient was performed of non-diagnostic images after the procedure.  IMPRESSION:   1. Non-diagnostic TEE due to inability to visualize the valves. Possible  causes could be a tortuous esophagus, placement of the probe in a large diverticulum, esophageal air, stricture or hiatal hernia obscuring the probe ultrasound or stricture.  We are also testing the probe - since there were some images, I doubt it was an equipment issue (which typically does not produce any image), but if that was the case, could consider repeat TEE attempt with different equipment.  ADDENDUM: The probe was tested and is working properly. There is likely an anatomic reason for poor images. Consider upper GI imaging series with follow-through if clinically indicated.  RECOMMENDATIONS:    1.  Management of bacteremia per ID recommendations.  Time Spent Directly with the Patient:  45 minutes   Pixie Casino, MD, [facility], [place] Director of the Advanced Lipid Disorders &  Cardiovascular Risk Reduction Clinic Diplomate of the American Board of Clinical Lipidology Attending Cardiologist  Direct Dial: [phone]  Fax: [phone]  Website:  [email or web address] 08/26/2019, 9:58 AM

## 2019-08-26 NOTE — Progress Notes (Signed)
Corey Oneill   Reason for visit: Follow up on bacteremia  Interval History: TEE unsuccessful and will not be able to do; afebrile, WBC wnl.  Repeat cultures remain ngtd.  Patient anxious to go home.  No associated rash or diarrhea.    Physical Exam: Constitutional:  Vitals:   08/26/19 1030 08/26/19 1104  BP: (!) 140/47 139/72  Pulse:  78  Resp: 16 18  Temp:  (!) 97.5 F (36.4 C)  SpO2: 95% 90%   patient appears in NAD Eyes: anicteric Respiratory: Normal respiratory effort; CTA B Cardiovascular: RRR GI: soft, nt, nd  Review of Systems: Constitutional: negative for fevers and chills Gastrointestinal: negative for nausea and diarrhea Integument/breast: negative for rash  Lab Results  Component Value Date   WBC 6.2 08/26/2019   HGB 9.0 (L) 08/26/2019   HCT 26.6 (L) 08/26/2019   MCV 98.9 08/26/2019   PLT 92 (L) 08/26/2019    Lab Results  Component Value Date   CREATININE 0.66 08/26/2019   BUN 11 08/26/2019   NA 135 08/26/2019   K 3.3 (L) 08/26/2019   CL 103 08/26/2019   CO2 24 08/26/2019    Lab Results  Component Value Date   ALT 13 08/22/2019   AST 23 08/22/2019   ALKPHOS 49 08/22/2019     Microbiology: Recent Results (from the past 240 hour(s))  Blood Culture (routine x 2)     Status: Abnormal   Collection Time: 08/21/19 10:12 AM   Specimen: BLOOD  Result Value Ref Range Status   Specimen Description   Final    BLOOD RIGHT ANTECUBITAL Performed at [facility], [place] [address] [place], [place] [address]    Special Requests   Final    BOTTLES DRAWN AEROBIC AND ANAEROBIC Blood Culture results may not be optimal due to an inadequate volume of blood received in culture bottles Performed at [facility] [address], [place], [place] [address]    Culture  Setup Time   Final    IN BOTH AEROBIC AND ANAEROBIC BOTTLES GRAM POSITIVE COCCI CRITICAL RESULT CALLED TO, READ BACK BY AND VERIFIED  WITH: Lavell Luster [facility] 08/22/19 0128 JDW Performed at [facility], [place] [address] [place], [place] [address]    Culture ENTEROCOCCUS FAECALIS (A)  Final   Report Status 08/23/2019 FINAL  Final   Organism ID, Bacteria ENTEROCOCCUS FAECALIS  Final      Susceptibility   Enterococcus faecalis - MIC*    AMPICILLIN <=2 SENSITIVE Sensitive     VANCOMYCIN 1 SENSITIVE Sensitive     GENTAMICIN SYNERGY SENSITIVE Sensitive     * ENTEROCOCCUS FAECALIS  Urine culture     Status: None   Collection Time: 08/21/19 10:12 AM   Specimen: In/Out Cath Urine  Result Value Ref Range Status   Specimen Description   Final    IN/OUT CATH URINE Performed at [facility], [place] [address], [place], [place] [address]    Special Requests   Final    NONE Performed at [facility], [place] [address] [place], [place] [address]    Culture   Final    NO GROWTH Performed at [facility], [place] [address] [place], [place] [address]    Report Status 08/22/2019 FINAL  Final  Blood Culture ID Panel (Reflexed)     Status: Abnormal   Collection Time: 08/21/19 10:12 AM  Result Value Ref Range Status  Enterococcus species DETECTED (A) NOT DETECTED Final    Comment: CRITICAL RESULT CALLED TO, READ BACK BY AND VERIFIED WITH: J GRIMSLEY PHARMD 08/22/19 0128 JDW    Vancomycin resistance NOT DETECTED NOT DETECTED Final   Listeria monocytogenes NOT DETECTED NOT DETECTED Final   Staphylococcus species NOT DETECTED NOT DETECTED Final   Staphylococcus aureus (BCID) NOT DETECTED NOT DETECTED Final   Streptococcus species NOT DETECTED NOT DETECTED Final   Streptococcus agalactiae NOT DETECTED NOT DETECTED Final   Streptococcus pneumoniae NOT DETECTED NOT DETECTED Final   Streptococcus pyogenes NOT DETECTED NOT DETECTED Final   Acinetobacter baumannii NOT DETECTED NOT DETECTED Final   Enterobacteriaceae species NOT DETECTED NOT DETECTED Final   Enterobacter cloacae complex NOT  DETECTED NOT DETECTED Final   Escherichia coli NOT DETECTED NOT DETECTED Final   Klebsiella oxytoca NOT DETECTED NOT DETECTED Final   Klebsiella pneumoniae NOT DETECTED NOT DETECTED Final   Proteus species NOT DETECTED NOT DETECTED Final   Serratia marcescens NOT DETECTED NOT DETECTED Final   Haemophilus influenzae NOT DETECTED NOT DETECTED Final   Neisseria meningitidis NOT DETECTED NOT DETECTED Final   Pseudomonas aeruginosa NOT DETECTED NOT DETECTED Final   Candida albicans NOT DETECTED NOT DETECTED Final   Candida glabrata NOT DETECTED NOT DETECTED Final   Candida krusei NOT DETECTED NOT DETECTED Final   Candida parapsilosis NOT DETECTED NOT DETECTED Final   Candida tropicalis NOT DETECTED NOT DETECTED Final    Comment: Performed at [facility], [address], [place], [place] [address]  Blood Culture (routine x 2)     Status: Abnormal   Collection Time: 08/21/19 10:17 AM   Specimen: BLOOD LEFT FOREARM  Result Value Ref Range Status   Specimen Description   Final    BLOOD LEFT FOREARM Performed at [facility], [place] [address] [place], [place] [address]    Special Requests   Final    BOTTLES DRAWN AEROBIC AND ANAEROBIC Blood Culture adequate volume Performed at [facility] [address] [place], [place] [address]    Culture  Setup Time   Final    IN BOTH AEROBIC AND ANAEROBIC BOTTLES GRAM POSITIVE COCCI CRITICAL VALUE NOTED.  VALUE IS CONSISTENT WITH PREVIOUSLY REPORTED AND CALLED VALUE.    Culture (A)  Final    ENTEROCOCCUS FAECALIS SUSCEPTIBILITIES PERFORMED ON PREVIOUS CULTURE WITHIN THE LAST 5 DAYS. Performed at [facility], [place] [address] [place], [place] [address]    Report Status 08/23/2019 FINAL  Final  SARS Coronavirus 2 [facility] order, Performed in [facility] hospital lab) Nasopharyngeal Nasopharyngeal Swab     Status: None   Collection Time: 08/21/19 10:41 AM   Specimen: Nasopharyngeal Swab  Result Value Ref Range  Status   SARS Coronavirus 2 NEGATIVE NEGATIVE Final    Comment: (NOTE) If result is NEGATIVE SARS-CoV-2 target nucleic acids are NOT DETECTED. The SARS-CoV-2 RNA is generally detectable in upper and lower  respiratory specimens during the acute phase of infection. The lowest  concentration of SARS-CoV-2 viral copies this assay can detect is 250  copies / mL. A negative result does not preclude SARS-CoV-2 infection  and should not be used as the sole basis for treatment or other  patient management decisions.  A negative result may occur with  improper specimen collection / handling, submission of specimen other  than nasopharyngeal swab, presence of viral mutation(s) within the  areas targeted by this assay, and inadequate number of viral copies  (<  250 copies / mL). A negative result must be combined with clinical  observations, patient history, and epidemiological information. If result is POSITIVE SARS-CoV-2 target nucleic acids are DETECTED. The SARS-CoV-2 RNA is generally detectable in upper and lower  respiratory specimens dur ing the acute phase of infection.  Positive  results are indicative of active infection with SARS-CoV-2.  Clinical  correlation with patient history and other diagnostic information is  necessary to determine patient infection status.  Positive results do  not rule out bacterial infection or co-infection with other viruses. If result is PRESUMPTIVE POSTIVE SARS-CoV-2 nucleic acids MAY BE PRESENT.   A presumptive positive result was obtained on the submitted specimen  and confirmed on repeat testing.  While 2019 novel coronavirus  (SARS-CoV-2) nucleic acids may be present in the submitted sample  additional confirmatory testing may be necessary for epidemiological  and / or clinical management purposes  to differentiate between  SARS-CoV-2 and other Sarbecovirus currently known to infect humans.  If clinically indicated additional testing with an alternate  test  methodology [phone]) is advised. The SARS-CoV-2 RNA is generally  detectable in upper and lower respiratory sp ecimens during the acute  phase of infection. The expected result is Negative. Fact Sheet for Patients:  StrictlyIdeas.no Fact Sheet for Healthcare Providers: BankingDealers.co.za This test is not yet approved or cleared by the [place] FDA and has been authorized for detection and/or diagnosis of SARS-CoV-2 by FDA under an Emergency Use Authorization (EUA).  This EUA will remain in effect (meaning this test can be used) for the duration of the COVID-19 declaration under Section 564(b)(1) of the Act, 21 U.S.C. section 360bbb-3(b)(1), unless the authorization is terminated or revoked sooner. Performed at [facility], [place] [address] [place], [place] [address]   Culture, blood (routine x 2)     Status: None (Preliminary result)   Collection Time: 08/23/19  5:43 AM   Specimen: BLOOD RIGHT ARM  Result Value Ref Range Status   Specimen Description   Final    BLOOD RIGHT ARM Performed at [facility], [address] [place], [place] [address]    Special Requests   Final    BOTTLES DRAWN AEROBIC AND ANAEROBIC Blood Culture adequate volume Performed at [facility] [address] [place], [place] [address]    Culture   Final    NO GROWTH 2 DAYS Performed at [facility] [address], [place], [place] [address]    Report Status PENDING  Incomplete  Culture, blood (routine x 2)     Status: None (Preliminary result)   Collection Time: 08/23/19  5:43 AM   Specimen: BLOOD LEFT HAND  Result Value Ref Range Status   Specimen Description   Final    BLOOD LEFT HAND Performed at [facility], [address] [place], [place] [address]    Special Requests   Final    BOTTLES DRAWN AEROBIC AND ANAEROBIC Blood Culture adequate volume Performed at [facility] [place] [address] [place], [place] [address]    Culture   Final    NO GROWTH 2 DAYS Performed at [facility] [address] [place], [place] [address]    Report Status PENDING  Incomplete    Impression/Plan:  1. Bacteremia - no evidence of endocarditis on TTE but unable to do TEE.  I will have him do 4 weeks total of antibiotics to assure clearance.  No strong evidence  of endocarditis so no indication for dual beta lactam therapy, will just use ampicillin.   I will place opat consult Antibiotics through November 6th Pull picc line at the end of treatment I will arrange follow up at the end of treatment  2.  Access - I will order a picc line today  3. Medication monitoring - creat wnl.  4. Thrombocytopenia - chronic and has been stable.

## 2019-08-26 NOTE — Progress Notes (Signed)
Echocardiogram Echocardiogram Transesophageal has been performed.  Oneal Deputy Cordarious Zeek 08/26/2019, 10:02 AM

## 2019-08-26 NOTE — Progress Notes (Signed)
Primary RN aware that PICC insertion will be done tomorrow 10/14.

## 2019-08-26 NOTE — Progress Notes (Signed)
PHARMACY CONSULT NOTE FOR:  OUTPATIENT  PARENTERAL ANTIBIOTIC THERAPY (OPAT)  Indication: enterococcal bacteremia Regimen: Ampicillin 12 g daily (continuous infusion) End date: 09/19/2019  IV antibiotic discharge orders are pended. To discharging provider:  please sign these orders via discharge navigator,  Select New Orders & click on the button choice - Manage This Unsigned Work.     Thank you for allowing pharmacy to be a part of this patient's care.  Alicea Wente A 08/26/2019, 3:36 PM

## 2019-08-26 NOTE — Progress Notes (Signed)
PROGRESS NOTE  Corey Oneill  DOB: 12-07-1935  PCP: [name], MD IDP:[id number]  DOA: 08/21/2019  LOS: 5 days   Brief narrative: Corey Oneill is a 83 y.o. male with PMH of aortic stenosis s/p TAVR, CAD, chronic diastolic CHF, ITP, prostate cancer, radiation proctitis, HLD, depression, GERD. At his baseline ambulates with assistance, independent for most of his ADL;  manages his medication on his own. March 2014 - prostate cancer diagnosed. Follows up with Dr. 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 April 2020 -started on enzalutamide September 2020 -patient presented with urine retention.  A Foley catheter could not be placed.  He underwent a suprapubic catheter placement by IR. 10/2 -patient saw urologist in the office. 10/5 -patient underwent urethral stricture dilation, suprapubic tube removal and a urethral Foley catheter placement.  He was discharged home on oral Keflex. 10/8-patient presented to the ED with complaint of fever and chills for 24 hours.  In the ED, patient had a temperature of 103.3. Chest x-ray unremarkable. COVID-19 negative. Urinalysis showed cloudy urine with a small amount of leukocytes and rare bacteria Patient was admitted to hospitalist service for UTI sepsis. Blood culture as well as urine culture obtained on admission is showing growth of Enterococcus faecalis.  Subjective: Patient was seen and examined this morning.  Pleasant elderly Caucasian male.  Sitting up in chair.  Not in distress.  Patient underwent TEE this morning.  Apparently, good images could not be obtained.  Assessment/Plan: Sepsis due to UTI -POA UTI and bacteremia with Enterococcus faecalis -associated with recent instrumentation and Foley catheter placement. -Met sepsis criteria on admission. -Blood culture as well as urine culture obtained on admission grew Enterococcus faecalis.  -Infectious disease consult was obtained.  Antibiotic was adjusted to IV ampicillin.   -TTE did not show any vegetation.  Has a TAVR.  TEE could not obtain good images.  Defer to ID if second TEE is needed. -Fever trending down.  WBC remains normal.   -Repeat blood culture sent on 10/10 has not shown any growth so far.  A. fib with RVR -No history of A. fib in the past.  Patient had a brief episode of A. fib with RVR on 10/9, spontaneously converted after home dose of Coreg with given.  Likely situational A. fib due to sepsis.  I do not think he would need any anticoagulation unless A. fib rate clears spontaneously. -Continue to monitor in telemetry.  Cardiovascular issues: CAD, chronic diastolic CHF, hypertension, HLD, aortic stenosis s/p TAVR -Home meds include Coreg, Imdur, aspirin, Crestor. -All resumed.  Blood pressure remains in normal range. -Fluids stopped. -Blood pressure in normal range. -Patient is not on any diuretic does not appear to be volume overloaded right now.  -TTE 10/9 showed EF 60 to 65% and mild increased LVH.  Hypokalemia -Potassium level remains low, 3.3 today.  Oral replacement ordered.    Mood disorder. -Continue Cymbalta from home.  Prostate cancer, Stage IV, castration resistance -Patient is on Xtandi. -Recently underwent dilatation of urethral stricture conversion of suprapubic catheter to urethral Foley catheter. -I discussed on the phone with Dr. Junious Silk on 10/12.  Recommends to continue the same Foley catheter till next office visit.  He gave me an appointment for the patient on 09/03/2019 at 9 AM.  We will call a formal consult if any other urological symptoms would occur.  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 Patient is on  methotrexate. Monitor.  Follows up with [facility].  Chronic ITP. Platelet count chronically low, 64 today.   Chronic anemia -Macrocytosis, MCV 101.5 -Hemoglobin only slightly low at baseline.  Hemoglobin 9  today. -Iron profile shows serum iron level elevated to 412, low normal vitamin B12 level. -also started on oral vitamin B12 supplement.  Mobility: PT eval ordered. DVT prophylaxis:  SCDs Code Status:   Code Status: Full Code  Family Communication:   Expected Discharge:  Anticipate home after ID clearance.  Consultants:  Infectious disease  Procedures:  TEE today.  Antimicrobials: Anti-infectives (From admission, onward)   Start     Dose/Rate Route Frequency Ordered Stop   08/22/19 0515  ampicillin (OMNIPEN) 2 g in sodium chloride 0.9 % 100 mL IVPB     2 g 300 mL/hr over 20 Minutes Intravenous Every 4 hours 08/22/19 0501     08/21/19 1800  ceFEPIme (MAXIPIME) 2 g in sodium chloride 0.9 % 100 mL IVPB  Status:  Discontinued     2 g 200 mL/hr over 30 Minutes Intravenous Every 8 hours 08/21/19 1514 08/22/19 0500   08/21/19 1030  ceFEPIme (MAXIPIME) 2 g in sodium chloride 0.9 % 100 mL IVPB     2 g 200 mL/hr over 30 Minutes Intravenous  Once 08/21/19 1015 08/21/19 1134      Diet Order            Diet Heart Room service appropriate? Yes; Fluid consistency: Thin  Diet effective now              Infusions:  . ampicillin (OMNIPEN) IV 2 g (08/26/19 1145)  . lactated ringers Stopped (08/22/19 0256)    Scheduled Meds: . aspirin EC  81 mg Oral QHS  . carvedilol  3.125 mg Oral BID WC  . Chlorhexidine Gluconate Cloth  6 each Topical Daily  . docusate sodium  100 mg Oral BID  . DULoxetine  30 mg Oral Daily  . isosorbide mononitrate  30 mg Oral Daily  . rosuvastatin  10 mg Oral Daily  . sodium chloride flush  3 mL Intravenous Q12H  . tamsulosin  0.4 mg Oral Daily  . vitamin B-12  1,000 mcg Oral Daily    PRN meds: acetaminophen **OR** acetaminophen, HYDROcodone-acetaminophen, ondansetron **OR** ondansetron (ZOFRAN) IV   Objective: Vitals:   08/26/19 1030 08/26/19 1104  BP: (!) 140/47 139/72  Pulse:  78  Resp: 16 18  Temp:  (!) 97.5 F (36.4 C)  SpO2: 95% 90%     Intake/Output Summary (Last 24 hours) at 08/26/2019 1255 Last data filed at 08/26/2019 0716 Gross per 24 hour  Intake 780 ml  Output 3000 ml  Net -2220 ml   Filed Weights   08/25/19 0656 08/26/19 0700 08/26/19 0911  Weight: 70.6 kg 71.6 kg 68 kg   Weight change: 1.023 kg Body mass index is 21.52 kg/m.   Physical Exam: General exam: Appears calm and comfortable.  Has a Foley catheter on. Skin: No rashes, lesions or ulcers. HEENT: Atraumatic, normocephalic, supple neck, no obvious bleeding Lungs: Clear to auscultation bilaterally CVS: Regular rate and rhythm, no murmur GI/Abd soft, nontender, non-distended, bowel sound present CNS: Alert, awake, able to answer simple questions. Able to follow commands.   Psychiatry: Mood appropriate Extremities: No pedal edema, no calf tenderness  Data Review: I have personally reviewed the laboratory data and studies available.  Recent Labs  Lab 08/21/19 1012 08/22/19 0443 08/24/19 0518 08/25/19 0501 08/26/19 0515  WBC 10.3 4.8  3.5* 5.3 6.2  NEUTROABS 9.6*  --  2.4 3.6 4.3  HGB 10.0* 9.2* 8.8* 8.8* 9.0*  HCT 30.0* 27.4* 25.5* 26.1* 26.6*  MCV 102.0* 101.5* 97.3 99.2 98.9  PLT 83* 51* 38* 64* 92*   Recent Labs  Lab 08/22/19 0443 08/24/19 0518 08/24/19 1353 08/25/19 0501 08/26/19 0515  NA 134* 131* 132* 136 135  K 3.4* 2.5* 3.7 3.5 3.3*  CL 102 98 100 103 103  CO2 _0 GLUCOSE 113* 126* 113* 103* 105*  BUN _1 CREATININE 0.82 0.60* 0.66 0.65 0.66  CALCIUM 7.1* 6.6* 7.0* 7.2* 7.3*  MG  --  2.3  --   --   --     Terrilee Croak, MD  [facility] 08/26/2019

## 2019-08-26 NOTE — TOC Initial Note (Addendum)
Transition of Care [facility]) - Initial/Assessment Note    Patient Details  Name: Corey Oneill MRN: [id number] Date of Birth: [birth year]  Transition of Care [facility]) CM/SW Contact:    Wende Neighbors, LCSW Phone Number: 08/26/2019, 1:05 PM  Clinical Narrative:   CSW met patient at bedside with [facility] Manuela Schwartz present during assessment. Patient stated he lives at home with spouse but stated spouse does work during the week. Patient stated he is agreeable to have [place] in the home. Patient stated he wants an agency that will be able to take patient insurance. CSW will find [facility] agency for patient and will request an order for rolling walker. Patient verbalized being uneasy about being home by himself while wife is at work. CSW contacted [facility] to see if patient would qualify for Home First program                   Expected Discharge Plan: [place] Barriers to Discharge: Continued Medical Work up   Patient Goals and CMS Choice Patient states their goals for this hospitalization and ongoing recovery are:: to be home with spouse CMS Medicare.gov Compare Post Acute Care list provided to:: Patient Choice offered to / list presented to : Patient(patient stated he wants agency that takes his insurance)  Expected Discharge Plan and Services Expected Discharge Plan: [place] In-house Referral: Clinical Social Work   Post Acute Care Choice: [place] arrangements for the past 2 months: [place]                                      Prior Living Arrangements/Services Living arrangements for the past 2 months: Single Family Home Lives with:: Self, Spouse Patient language and need for interpreter reviewed:: Yes Do you feel safe going back to the place where you live?: Yes      Need for Family Participation in Patient Care: Yes (Comment) Care giver support system in place?: Yes (comment)   Criminal Activity/Legal Involvement Pertinent to  Current Situation/Hospitalization: No - Comment as needed  Activities of Daily Living Home Assistive Devices/Equipment: Eyeglasses, Hearing aid(left hearing aid) ADL Screening (condition at time of admission) Patient's cognitive ability adequate to safely complete daily activities?: No Is the patient deaf or have difficulty hearing?: Yes(wears hearing aid in the left) Does the patient have difficulty seeing, even when wearing glasses/contacts?: No Does the patient have difficulty concentrating, remembering, or making decisions?: Yes Patient able to express need for assistance with ADLs?: Yes Does the patient have difficulty dressing or bathing?: Yes Independently performs ADLs?: No Communication: Independent Dressing (OT): Needs assistance Is this a change from baseline?: Change from baseline, expected to last >3 days Grooming: Needs assistance Is this a change from baseline?: Change from baseline, expected to last >3 days Feeding: Needs assistance Is this a change from baseline?: Change from baseline, expected to last >3 days Bathing: Needs assistance Is this a change from baseline?: Change from baseline, expected to last >3 days Toileting: Needs assistance Is this a change from baseline?: Change from baseline, expected to last >3days In/Out Bed: Needs assistance Is this a change from baseline?: Change from baseline, expected to last >3 days Walks in Home: Needs assistance Is this a change from baseline?: Change from baseline, expected to last >3 days Does the patient have difficulty walking or climbing stairs?: Yes(secondary to weakness) Weakness  of Legs: Both Weakness of Arms/Hands: None  Permission Sought/Granted Permission sought to share information with : Family Supports Permission granted to share information with : Yes, Verbal Permission Granted  Share Information with NAME: Frances Mungo     Permission granted to share info w Relationship: spouse  Permission granted to  share info w Contact Information: [phone]  Emotional Assessment Appearance:: Appears stated age Attitude/Demeanor/Rapport: Engaged, Self-Confident Affect (typically observed): Accepting, Pleasant Orientation: : Oriented to Self, Oriented to Place, Oriented to  Time, Oriented to Situation Alcohol / Substance Use: Not Applicable Psych Involvement: No (comment)  Admission diagnosis:  Sepsis, due to unspecified organism, unspecified whether acute organ dysfunction present (HCC) [A41.9] Patient Active Problem List   Diagnosis Date Noted  . Sepsis (HCC) 08/21/2019  . Bullous pemphigoid 06/12/2019  . Metastasis from malignant neoplasm of prostate (HCC) 02/25/2019  . Radiation proctitis   . Hx of radiation therapy   . Heart murmur   . H/O blood clots   . Family history of adverse reaction to anesthesia   . CAD (coronary artery disease)   . Aortic stenosis   . Anginal pain (HCC)   . History of ITP 02/26/2017  . 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 Malignant melanoma in situ (HCC) 09/04/2016  . 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 S/P appendectomy 11/12/2015  . Esophageal reflux 03/22/2015  . Depression 11/20/2014  . Prostate cancer (HCC) 03/05/2013  . Erectile dysfunction   . Hyperlipidemia 06/07/2007   PCP:  [name], John C, MD Pharmacy:   [facility] - [place], [place] - [address] [place] [address] Phone: [phone] Fax: [phone]     Social Determinants of Health (SDOH) Interventions    Readmission Risk Interventions No flowsheet data found.  

## 2019-08-27 MED ORDER — SODIUM CHLORIDE 0.9% FLUSH: 10.0000 mL | INTRAVENOUS | Status: DC | PRN

## 2019-08-27 NOTE — TOC Transition Note (Signed)
Transition of Care [facility]) - CM/SW Discharge Note   Patient Details  Name: Corey Oneill MRN: [id number] Date of Birth: Jul 01, 1936  [facility]) CM/SW Contact:  Wende Neighbors, LCSW Phone Number: 08/27/2019, 11:41 AM   Clinical Narrative:   Patient will be going home with [facility] following. Patient qualifies for Home First programs through [place]. Patient made aware of program and was agreeable. Rolling walker has been ordered for patient and will be delivered to his room prior to discharge.     Final next level of care: [place] Barriers to Discharge: Continued Medical Work up   Patient Goals and CMS Choice Patient states their goals for this hospitalization and ongoing recovery are:: to be home with spouse CMS Medicare.gov Compare Post Acute Care list provided to:: Patient Choice offered to / list presented to : Patient(patient stated he wants agency that takes his insurance)  Discharge Placement                       Discharge Plan and Services In-house Referral: Clinical Social Work   Post Acute Care Choice: Home Health                               Social Determinants of Health (SDOH) Interventions     Readmission Risk Interventions No flowsheet data found.

## 2019-08-27 NOTE — Care Management Important Message (Signed)
Important Message  Patient Details  Name: TAVIOUS CARAHER MRN: [id number] Date of Birth: 1936-01-11   Medicare Important Message Given:  Yes. CMA printed out the IM for Case Management Nurse or CSW to give to the patient.      Suellen Durocher 08/27/2019, 8:17 AM

## 2019-08-27 NOTE — Progress Notes (Signed)
Peripherally Inserted Central Catheter/Midline Placement  The IV Nurse has discussed with the patient and/or persons authorized to consent for the patient, the purpose of this procedure and the potential benefits and risks involved with this procedure.  The benefits include less needle sticks, lab draws from the catheter, and the patient may be discharged home with the catheter. Risks include, but not limited to, infection, bleeding, blood clot (thrombus formation), and puncture of an artery; nerve damage and irregular heartbeat and possibility to perform a PICC exchange if needed/ordered by physician.  Alternatives to this procedure were also discussed.  Bard Power PICC patient education guide, fact sheet on infection prevention and patient information card has been provided to patient /or left at bedside.    PICC/Midline Placement Documentation  PICC Single Lumen [id number] PICC Right Basilic 41 cm 0 cm (Active)  Indication for Insertion or Continuance of Line Home intravenous therapies (PICC only) 08/27/19 1639  Exposed Catheter (cm) 0 cm 08/27/19 1639  Site Assessment Clean;Dry;Intact 08/27/19 1639  Line Status Flushed;Blood return noted 08/27/19 1639  Dressing Type Transparent 08/27/19 1639  Dressing Status Clean;Dry;Intact;Antimicrobial disc in place;Other (Comment) 08/27/19 1639  Dressing Intervention New dressing 08/27/19 [id number]  Dressing Change Due 09/03/19 08/27/19 1639       Corey Oneill 08/27/2019, 4:40 PM

## 2019-08-27 NOTE — Telephone Encounter (Signed)
Thanks I spoke with Dr Debara Pickett and reviewed all of the hospital notes.

## 2019-08-27 NOTE — Discharge Summary (Signed)
Physician Discharge Summary  Corey Oneill TSV:[id number] DOB: 1935/11/23 DOA: 08/21/2019  PCP: [name], MD  Admit date: 08/21/2019 Discharge date: 08/27/2019  Admitted From: Home Discharge disposition: Home with home health   Code Status: Full Code  Diet Recommendation: Cardiac diet  Recommendations for Outpatient Follow-Up:   1. Follow-up with urologist as outpatient, appointment given. 2. Follow-up with infectious disease at the end of antibiotic treatment.  Discharge Diagnosis:   Active Problems:   Sepsis ([place])  History of Present Illness / Brief narrative:  Corey Oneill a 83 y.o.malewith PMH of aortic stenosis s/p TAVR, CAD, chronic diastolic CHF, ITP, prostate cancer, radiation proctitis, HLD, depression, GERD. Athisbaseline ambulates with assistance, independent for most ofhisADL;  manageshismedication on hisown. March 2014 - prostate cancer diagnosed. Follows up with Dr. 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 April 2020 -started on enzalutamide September 2020 -patient presented with urine retention.  A Foley catheter could not be placed.  He underwent a suprapubic catheter placement by IR. 10/2 -patient saw urologist in the office. 10/5 -patient underwent urethral stricture dilation, suprapubic tube removal and a urethral Foley catheter placement.  He was discharged home on oral Keflex. 10/8-patient presented to the ED with complaint of fever and chills for 24 hours.  In the ED, patient had a temperature of 103.3. Chest x-ray unremarkable. COVID-19 negative. Urinalysis showed cloudy urine with a small amount of leukocytes and rare bacteria Patient was admitted to hospitalist service for UTI sepsis. Blood culture as well as urine culture obtained on admission is showing growth of Enterococcus faecalis.   Subjective:  Seen and examined this morning.   Pleasant elderly Caucasian male.  Sitting up in bed.  Not in distress.  No new symptoms.  Ready to go home today.  Hospital Course:  Sepsis due to UTI -POA UTI and bacteremia with Enterococcus faecalis -associated with recent instrumentation and Foley catheter placement. -Met sepsis criteria on admission. -Blood culture as well as urine culture obtained on admission grew Enterococcus faecalis. -Infectious disease consult was obtained.  Antibiotic was adjusted to IV ampicillin.   -TTE did not show any vegetation.  Has a TAVR.  TEE could not obtain good images.  Defer to ID if second TEE is needed. -Fever trending down.  WBC remains normal.   -Repeat blood culture sent on 10/10 has not shown any growth so far. -Per ID recommendation, will start the patient on ampicillin 2 g every 4 hours for 4 weeks through November 6.  Because of frequency of antibiotic required, home health will run it as a total of 12 g per 24 hours continuous infusion.  PICC line insertion today.  A. fib with RVR -No history of A. fib in the past.  Patient had a brief episode of A. fib with RVR on 10/9, spontaneously converted after home dose of Coreg was given.  Likely situational A. fib due to sepsis.  I do not think he would need any anticoagulation unless A. fib rate clears spontaneously. -No recurrence of A. fib on telemetry monitoring for several days.  Cardiovascular issues:CAD, chronic diastolic CHF, hypertension, HLD, aortic stenosis s/p TAVR -Home meds include Coreg, Imdur, aspirin, Crestor. -All resumed.  Blood pressure remains in normal range. -Fluids stopped. -Blood pressure in normal range. -Patient is not on any diuretic does not appear to be volume overloaded right now.  -TTE 10/9 showed EF 60 to 65% and mild increased  LVH.  Hypokalemia -Potassium level  was low in the hospital because of diarrhea.  Diarrhea stopped.  Potassium level normalized with replacement.  Mood disorder. -Continue Cymbalta  from home.  Prostate cancer,Stage IV, castration resistance -Patient is on Xtandi. -Recently underwent dilatation of urethral stricture conversion of suprapubic catheter to urethral Foley catheter. -I discussed on the phone with Dr. Junious Silk on 10/12.  Recommends to continue the same Foley catheter till next office visit.  He gave me an appointment for the patient on 09/03/2019 at 9 AM.   -Patient will be discharged home with Foley catheter.  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 Patient is on methotrexate. Monitor. Follows up with [facility].  Chronic ITP. Platelet count chronically low, 92 on last check 10/13  Chronic anemia -Macrocytosis, MCV 101.5 -Hemoglobin only slightly low at baseline.  Hemoglobin 9  on last check 10/13 -Iron profile shows serum iron level elevated to 412, low normal vitamin B12 level. -Continue vitamin B12 supplement at home.  Stable for discharge to home today with home health.  Discharge Exam:   Vitals:   08/26/19 1104 08/26/19 1642 08/26/19 2207 08/27/19 0602  BP: 139/72 137/78 128/80 (!) 138/91  Pulse: 78 77 81 84  Resp: '18  16 18  ' Temp: (!) 97.5 F (36.4 C)  98 F (36.7 C) 97.9 F (36.6 C)  TempSrc: Oral   Oral  SpO2: 90%  94% 95%  Weight:      Height:        Body mass index is 21.52 kg/m.  General exam: Appears calm and comfortable.  Skin: No rashes, lesions or ulcers. HEENT: Atraumatic, normocephalic, supple neck, no obvious bleeding Lungs: Clear to auscultation bilaterally CVS: Regular rate and rhythm, no murmur GI/Abd soft, nontender, nondistended, bowel sound present CNS: Alert, awake, oriented x3, hard of hearing Psychiatry: Mood appropriate Extremities: No pedal edema, no calf tenderness  Discharge Instructions:  Wound care: None Discharge Instructions    Home infusion instructions [facility] May follow [place] Dosing Protocol; May administer Cathflo as needed to maintain patency of vascular  access device.; Flushing of vascular access device: per [facility] Protocol: 0.9% NaCl pre/post medica...   Complete by: As directed    Instructions: May follow [place] Dosing Protocol   Instructions: May administer Cathflo as needed to maintain patency of vascular access device.   Instructions: Flushing of vascular access device: per [facility] Protocol: 0.9% NaCl pre/post medication administration and prn patency; Heparin 100 u/ml, 70m for implanted ports and Heparin 10u/ml, 563mfor all other central venous catheters.   Instructions: May follow [facility] Anaphylaxis Protocol for First Dose Administration in the home: 0.9% NaCl at 25-50 ml/hr to maintain IV access for protocol meds. Epinephrine 0.3 ml IV/IM PRN and Benadryl 25-50 IV/IM PRN s/s of anaphylaxis.   Instructions: [facility] Coordinator (RN) to assist per patient IV care needs in the home PRN.   Increase activity slowly   Complete by: As directed      Follow-up Information    EsFestus AloeMD. Go on 09/03/2019.   Specialty: Urology Why: 09/03/2019, 9 am Contact information: [address] [place] [phone]        Allergies as of 08/27/2019   No Known Allergies     Medication List    STOP taking these medications   amoxicillin 500 MG tablet Commonly known as: AMOXIL   cephALEXin 500 MG capsule Commonly known as: KEFLEX   oxyCODONE 5  MG immediate release tablet Commonly known as: Roxicodone     TAKE these medications   acetaminophen 500 MG tablet Commonly known as: TYLENOL Take 500 mg by mouth every 6 (six) hours as needed for moderate pain.   ampicillin  IVPB Inject 12 g into the vein daily for 24 days. (give as continuous infusion over 24 hr) Indication:  Enterococcus bacteremia Last Day of Therapy:  09/19/19 Labs - Once weekly:  CBC/D and BMP, Labs - Every other week:  ESR and CRP   aspirin EC 81 MG tablet Take 81 mg by mouth at bedtime.   carvedilol 3.125 MG tablet  Commonly known as: COREG TAKE 1 TABLET BY MOUTH TWICE DAILY.   cyanocobalamin 2000 MCG tablet Take 2,000 mcg by mouth daily.   DULoxetine 30 MG capsule Commonly known as: CYMBALTA Take 30 mg by mouth daily.   enzalutamide 40 MG capsule Commonly known as: XTANDI Take 160 mg by mouth daily.   ferrous sulfate 325 (65 FE) MG EC tablet Take 1 tablet (325 mg total) by mouth daily with breakfast. If no side effects, change to twice daily after 1 week.   folic acid 1 MG tablet Commonly known as: FOLVITE Take 1 mg by mouth See admin instructions. Everyday but Tues   isosorbide mononitrate 30 MG 24 hr tablet Commonly known as: IMDUR Take 1 tablet (30 mg total) by mouth daily.   methotrexate 2.5 MG tablet Commonly known as: RHEUMATREX Take 10 mg by mouth once a week. Tuesday   methotrexate 2.5 MG tablet Take 2.5 mg by mouth daily.   MULTIPLE VITAMIN PO Take 1 tablet by mouth daily.   nitroGLYCERIN 0.4 MG SL tablet Commonly known as: NITROSTAT 1 TAB UNDER TONGUE AS NEEDED FOR CHEST PAIN. MAY REPEAT EVERY 5 MIN FOR A TOTAL OF 3 DOSES. What changed: See the new instructions.   rosuvastatin 10 MG tablet Commonly known as: CRESTOR TAKE 1 TABLET ONCE DAILY.            Home Infusion Instuctions  (From admission, onward)         Start     Ordered   08/26/19 0000  Home infusion instructions [facility] May follow [place] Dosing Protocol; May administer Cathflo as needed to maintain patency of vascular access device.; Flushing of vascular access device: per [facility] Protocol: 0.9% NaCl pre/post medica...    Question Answer Comment  Instructions May follow [place] Dosing Protocol   Instructions May administer Cathflo as needed to maintain patency of vascular access device.   Instructions Flushing of vascular access device: per [facility] Protocol: 0.9% NaCl pre/post medication administration and prn patency; Heparin 100 u/ml, 69m for implanted ports and Heparin 10u/ml, 587m for all other central venous catheters.   Instructions May follow [facility] Anaphylaxis Protocol for First Dose Administration in the home: 0.9% NaCl at 25-50 ml/hr to maintain IV access for protocol meds. Epinephrine 0.3 ml IV/IM PRN and Benadryl 25-50 IV/IM PRN s/s of anaphylaxis.   Instructions [facility] Infusion Coordinator (RN) to assist per patient IV care needs in the home PRN.      08/26/19 1703          Time coordinating discharge: 35 minutes  The results of significant diagnostics from this hospitalization (including imaging, microbiology, ancillary and laboratory) are listed below for reference.    Procedures and Diagnostic Studies:   Dg Chest Port 1 View  Result Date: 08/21/2019 CLINICAL DATA:  Pt recently had a urinary  tract surgery. Pt was taking cephalexin after D/C. Wife called EMS saying pt is febrile and more confused than normal. Hx of prostate ca with mets to bones. Pt very SOB, coughing, and acute fever.fever EXAM: PORTABLE CHEST 1 VIEW COMPARISON:  Radiograph 07/07/2019 FINDINGS: Normal mediastinum and cardiac silhouette. Normal pulmonary vasculature. No evidence of effusion, infiltrate, or pneumothorax. No acute bony abnormality. IMPRESSION: No acute cardiopulmonary process. Electronically Signed   By: Suzy Bouchard M.D.   On: 08/21/2019 11:25     Labs:   Basic Metabolic Panel: Recent Labs  Lab 08/22/19 0443 08/24/19 0518 08/24/19 1353 08/25/19 0501 08/26/19 0515  NA 134* 131* 132* 136 135  K 3.4* 2.5* 3.7 3.5 3.3*  CL 102 98 100 103 103  CO2 '23 24 24 24 24  ' GLUCOSE 113* 126* 113* 103* 105*  BUN '22 16 16 14 11  ' CREATININE 0.82 0.60* 0.66 0.65 0.66  CALCIUM 7.1* 6.6* 7.0* 7.2* 7.3*  MG  --  2.3  --   --   --    GFR Estimated Creatinine Clearance: 69.7 mL/min (by C-G formula based on SCr of 0.66 mg/dL). Liver Function Tests: Recent Labs  Lab 08/21/19 1012 08/22/19 0443  AST 21 23  ALT 11 13  ALKPHOS 59 49  BILITOT 0.8 0.5  PROT 6.2* 5.4*   ALBUMIN 3.5 2.7*   No results for input(s): LIPASE, AMYLASE in the last 168 hours. No results for input(s): AMMONIA in the last 168 hours. Coagulation profile No results for input(s): INR, PROTIME in the last 168 hours.  CBC: Recent Labs  Lab 08/21/19 1012 08/22/19 0443 08/24/19 0518 08/25/19 0501 08/26/19 0515  WBC 10.3 4.8 3.5* 5.3 6.2  NEUTROABS 9.6*  --  2.4 3.6 4.3  HGB 10.0* 9.2* 8.8* 8.8* 9.0*  HCT 30.0* 27.4* 25.5* 26.1* 26.6*  MCV 102.0* 101.5* 97.3 99.2 98.9  PLT 83* 51* 38* 64* 92*   Cardiac Enzymes: No results for input(s): CKTOTAL, CKMB, CKMBINDEX, TROPONINI in the last 168 hours. BNP: Invalid input(s): POCBNP CBG: No results for input(s): GLUCAP in the last 168 hours. D-Dimer No results for input(s): DDIMER in the last 72 hours. Hgb A1c No results for input(s): HGBA1C in the last 72 hours. Lipid Profile No results for input(s): CHOL, HDL, LDLCALC, TRIG, CHOLHDL, LDLDIRECT in the last 72 hours. Thyroid function studies No results for input(s): TSH, T4TOTAL, T3FREE, THYROIDAB in the last 72 hours.  Invalid input(s): FREET3 Anemia work up No results for input(s): VITAMINB12, FOLATE, FERRITIN, TIBC, IRON, RETICCTPCT in the last 72 hours. Microbiology Recent Results (from the past 240 hour(s))  Blood Culture (routine x 2)     Status: Abnormal   Collection Time: 08/21/19 10:12 AM   Specimen: BLOOD  Result Value Ref Range Status   Specimen Description   Final    BLOOD RIGHT ANTECUBITAL Performed at [facility] [address] [place], [place] [address]    Special Requests   Final    BOTTLES DRAWN AEROBIC AND ANAEROBIC Blood Culture results may not be optimal due to an inadequate volume of blood received in culture bottles Performed at [facility] [address] [place], [place] [address]    Culture  Setup Time   Final    IN BOTH AEROBIC AND ANAEROBIC BOTTLES GRAM POSITIVE COCCI CRITICAL RESULT CALLED TO, READ  BACK BY AND VERIFIED WITH: [facility] 08/22/19 0128 JDW Performed at [facility], [place] [address], [place], [place] [address]  Culture ENTEROCOCCUS FAECALIS (A)  Final   Report Status 08/23/2019 FINAL  Final   Organism ID, Bacteria ENTEROCOCCUS FAECALIS  Final      Susceptibility   Enterococcus faecalis - MIC*    AMPICILLIN <=2 SENSITIVE Sensitive     VANCOMYCIN 1 SENSITIVE Sensitive     GENTAMICIN SYNERGY SENSITIVE Sensitive     * ENTEROCOCCUS FAECALIS  Urine culture     Status: None   Collection Time: 08/21/19 10:12 AM   Specimen: In/Out Cath Urine  Result Value Ref Range Status   Specimen Description   Final    IN/OUT CATH URINE Performed at [facility], [place] [address], [place], [place] [address]    Special Requests   Final    NONE Performed at [facility], [place] [address], [place], [place] [address]    Culture   Final    NO GROWTH Performed at [facility], [place] [address], [place], [place] [address]    Report Status 08/22/2019 FINAL  Final  Blood Culture ID Panel (Reflexed)     Status: Abnormal   Collection Time: 08/21/19 10:12 AM  Result Value Ref Range Status   Enterococcus species DETECTED (A) NOT DETECTED Final    Comment: CRITICAL RESULT CALLED TO, READ BACK BY AND VERIFIED WITH: J GRIMSLEY PHARMD 08/22/19 0128 JDW    Vancomycin resistance NOT DETECTED NOT DETECTED Final   Listeria monocytogenes NOT DETECTED NOT DETECTED Final   Staphylococcus species NOT DETECTED NOT DETECTED Final   Staphylococcus aureus (BCID) NOT DETECTED NOT DETECTED Final   Streptococcus species NOT DETECTED NOT DETECTED Final   Streptococcus agalactiae NOT DETECTED NOT DETECTED Final   Streptococcus pneumoniae NOT DETECTED NOT DETECTED Final   Streptococcus pyogenes NOT DETECTED NOT DETECTED Final   Acinetobacter baumannii NOT DETECTED NOT DETECTED Final   Enterobacteriaceae species NOT DETECTED NOT DETECTED Final   Enterobacter  cloacae complex NOT DETECTED NOT DETECTED Final   Escherichia coli NOT DETECTED NOT DETECTED Final   Klebsiella oxytoca NOT DETECTED NOT DETECTED Final   Klebsiella pneumoniae NOT DETECTED NOT DETECTED Final   Proteus species NOT DETECTED NOT DETECTED Final   Serratia marcescens NOT DETECTED NOT DETECTED Final   Haemophilus influenzae NOT DETECTED NOT DETECTED Final   Neisseria meningitidis NOT DETECTED NOT DETECTED Final   Pseudomonas aeruginosa NOT DETECTED NOT DETECTED Final   Candida albicans NOT DETECTED NOT DETECTED Final   Candida glabrata NOT DETECTED NOT DETECTED Final   Candida krusei NOT DETECTED NOT DETECTED Final   Candida parapsilosis NOT DETECTED NOT DETECTED Final   Candida tropicalis NOT DETECTED NOT DETECTED Final    Comment: Performed at [facility], [place] [address] [place], [place] [address]  Blood Culture (routine x 2)     Status: Abnormal   Collection Time: 08/21/19 10:17 AM   Specimen: BLOOD LEFT FOREARM  Result Value Ref Range Status   Specimen Description   Final    BLOOD LEFT FOREARM Performed at [facility], [place] [address], [place], [place] [address]    Special Requests   Final    BOTTLES DRAWN AEROBIC AND ANAEROBIC Blood Culture adequate volume Performed at [facility] [address], [place], [place] [address]    Culture  Setup Time   Final    IN BOTH AEROBIC AND ANAEROBIC BOTTLES GRAM POSITIVE COCCI CRITICAL VALUE NOTED.  VALUE IS CONSISTENT WITH PREVIOUSLY REPORTED AND CALLED VALUE.    Culture (A)  Final    ENTEROCOCCUS FAECALIS SUSCEPTIBILITIES PERFORMED ON PREVIOUS CULTURE WITHIN THE LAST 5 DAYS. Performed at [facility], [place] [address], [place], [place] [address]    Report Status 08/23/2019 FINAL  Final  SARS Coronavirus 2 [facility] order, Performed in [facility] hospital lab) Nasopharyngeal Nasopharyngeal Swab     Status: None   Collection Time: 08/21/19 10:41 AM   Specimen: Nasopharyngeal Swab   Result Value Ref Range Status   SARS Coronavirus 2 NEGATIVE NEGATIVE Final    Comment: (NOTE) If result is NEGATIVE SARS-CoV-2 target nucleic acids are NOT DETECTED. The SARS-CoV-2 RNA is generally detectable in upper and lower  respiratory specimens during the acute phase of infection. The lowest  concentration of SARS-CoV-2 viral copies this assay can detect is 250  copies / mL. A negative result does not preclude SARS-CoV-2 infection  and should not be used as the sole basis for treatment or other  patient management decisions.  A negative result may occur with  improper specimen collection / handling, submission of specimen other  than nasopharyngeal swab, presence of viral mutation(s) within the  areas targeted by this assay, and inadequate number of viral copies  (<250 copies / mL). A negative result must be combined with clinical  observations, patient history, and epidemiological information. If result is POSITIVE SARS-CoV-2 target nucleic acids are DETECTED. The SARS-CoV-2 RNA is generally detectable in upper and lower  respiratory specimens dur ing the acute phase of infection.  Positive  results are indicative of active infection with SARS-CoV-2.  Clinical  correlation with patient history and other diagnostic information is  necessary to determine patient infection status.  Positive results do  not rule out bacterial infection or co-infection with other viruses. If result is PRESUMPTIVE POSTIVE SARS-CoV-2 nucleic acids MAY BE PRESENT.   A presumptive positive result was obtained on the submitted specimen  and confirmed on repeat testing.  While 2019 novel coronavirus  (SARS-CoV-2) nucleic acids may be present in the submitted sample  additional confirmatory testing may be necessary for epidemiological  and / or clinical management purposes  to differentiate between  SARS-CoV-2 and other Sarbecovirus currently known to infect humans.  If clinically indicated additional  testing with an alternate test  methodology [id number]) is advised. The SARS-CoV-2 RNA is generally  detectable in upper and lower respiratory sp ecimens during the acute  phase of infection. The expected result is Negative. Fact Sheet for Patients:  StrictlyIdeas.no Fact Sheet for Healthcare Providers: BankingDealers.co.za This test is not yet approved or cleared by the [place] FDA and has been authorized for detection and/or diagnosis of SARS-CoV-2 by FDA under an Emergency Use Authorization (EUA).  This EUA will remain in effect (meaning this test can be used) for the duration of the COVID-19 declaration under Section 564(b)(1) of the Act, 21 U.S.C. section 360bbb-3(b)(1), unless the authorization is terminated or revoked sooner. Performed at [facility], [place] [address] [place], [place] [address]   Culture, blood (routine x 2)     Status: None (Preliminary result)   Collection Time: 08/23/19  5:43 AM   Specimen: BLOOD RIGHT ARM  Result Value Ref Range Status   Specimen Description   Final    BLOOD RIGHT ARM Performed at [facility], [address], [place], [place] [address]    Special Requests   Final    BOTTLES DRAWN AEROBIC AND ANAEROBIC Blood Culture adequate volume Performed at [facility] [place]  [address] [place], [place] [address]    Culture   Final    NO GROWTH 3 DAYS Performed at [facility], [place] [address], [place], [place] [address]    Report Status PENDING  Incomplete  Culture, blood (routine x 2)     Status: None (Preliminary result)   Collection Time: 08/23/19  5:43 AM   Specimen: BLOOD LEFT HAND  Result Value Ref Range Status   Specimen Description   Final    BLOOD LEFT HAND Performed at [facility], [place] [address], [place], [place] [address]    Special Requests   Final    BOTTLES DRAWN AEROBIC AND ANAEROBIC Blood Culture adequate volume  Performed at [facility] [address] [place],  [address]    Culture   Final    NO GROWTH 3 DAYS Performed at [facility], [place] [address] [place],  [address]    Report Status PENDING  Incomplete    Please note: You were cared for by a hospitalist during your hospital stay. Once you are discharged, your primary care physician will handle any further medical issues. Please note that NO REFILLS for any discharge medications will be authorized once you are discharged, as it is imperative that you return to your primary care physician (or establish a relationship with a primary care physician if you do not have one) for your post hospital discharge needs so that they can reassess your need for medications and monitor your lab values.  Signed: Marlowe Aschoff Cesilia Shinn  [facility] 08/27/2019, 10:59 AM

## 2019-08-27 NOTE — Progress Notes (Signed)
Patient and his wife given discharge, follow up, medication, and foley care instructions, verbalized understanding with teach back, IV x 2 and telemetry monitor removed, personal belongings with patient, family to transport home

## 2019-08-28 DIAGNOSIS — F329 Major depressive disorder, single episode, unspecified: Secondary | ICD-10-CM | POA: Diagnosis not present

## 2019-08-28 DIAGNOSIS — L120 Bullous pemphigoid: Secondary | ICD-10-CM | POA: Diagnosis not present

## 2019-08-28 DIAGNOSIS — I5032 Chronic diastolic (congestive) heart failure: Secondary | ICD-10-CM | POA: Diagnosis not present

## 2019-08-28 DIAGNOSIS — E785 Hyperlipidemia, unspecified: Secondary | ICD-10-CM | POA: Diagnosis not present

## 2019-08-28 DIAGNOSIS — E876 Hypokalemia: Secondary | ICD-10-CM | POA: Diagnosis not present

## 2019-08-28 DIAGNOSIS — I2510 Atherosclerotic heart disease of native coronary artery without angina pectoris: Secondary | ICD-10-CM | POA: Diagnosis not present

## 2019-08-28 DIAGNOSIS — C7951 Secondary malignant neoplasm of bone: Secondary | ICD-10-CM | POA: Diagnosis not present

## 2019-08-28 DIAGNOSIS — H9190 Unspecified hearing loss, unspecified ear: Secondary | ICD-10-CM | POA: Diagnosis not present

## 2019-08-28 DIAGNOSIS — D630 Anemia in neoplastic disease: Secondary | ICD-10-CM | POA: Diagnosis not present

## 2019-08-28 DIAGNOSIS — I4891 Unspecified atrial fibrillation: Secondary | ICD-10-CM | POA: Diagnosis not present

## 2019-08-28 DIAGNOSIS — K219 Gastro-esophageal reflux disease without esophagitis: Secondary | ICD-10-CM | POA: Diagnosis not present

## 2019-08-28 DIAGNOSIS — A4181 Sepsis due to Enterococcus: Secondary | ICD-10-CM | POA: Diagnosis not present

## 2019-08-28 DIAGNOSIS — D693 Immune thrombocytopenic purpura: Secondary | ICD-10-CM | POA: Diagnosis not present

## 2019-08-28 DIAGNOSIS — C61 Malignant neoplasm of prostate: Secondary | ICD-10-CM | POA: Diagnosis not present

## 2019-08-28 DIAGNOSIS — D7589 Other specified diseases of blood and blood-forming organs: Secondary | ICD-10-CM | POA: Diagnosis not present

## 2019-08-28 DIAGNOSIS — N390 Urinary tract infection, site not specified: Secondary | ICD-10-CM | POA: Diagnosis not present

## 2019-08-28 DIAGNOSIS — Y842 Radiological procedure and radiotherapy as the cause of abnormal reaction of the patient, or of later complication, without mention of misadventure at the time of the procedure: Secondary | ICD-10-CM | POA: Diagnosis not present

## 2019-08-28 DIAGNOSIS — Z792 Long term (current) use of antibiotics: Secondary | ICD-10-CM | POA: Diagnosis not present

## 2019-08-28 DIAGNOSIS — T83511A Infection and inflammatory reaction due to indwelling urethral catheter, initial encounter: Secondary | ICD-10-CM | POA: Diagnosis not present

## 2019-08-28 DIAGNOSIS — Z452 Encounter for adjustment and management of vascular access device: Secondary | ICD-10-CM | POA: Diagnosis not present

## 2019-08-28 DIAGNOSIS — Z79899 Other long term (current) drug therapy: Secondary | ICD-10-CM | POA: Diagnosis not present

## 2019-08-28 DIAGNOSIS — R339 Retention of urine, unspecified: Secondary | ICD-10-CM | POA: Diagnosis not present

## 2019-08-28 DIAGNOSIS — I110 Hypertensive heart disease with heart failure: Secondary | ICD-10-CM | POA: Diagnosis not present

## 2019-08-28 DIAGNOSIS — Z7982 Long term (current) use of aspirin: Secondary | ICD-10-CM | POA: Diagnosis not present

## 2019-08-28 DIAGNOSIS — K627 Radiation proctitis: Secondary | ICD-10-CM | POA: Diagnosis not present

## 2019-08-28 LAB — CULTURE, BLOOD (ROUTINE X 2)
Culture: NO GROWTH
Culture: NO GROWTH
Special Requests: ADEQUATE
Special Requests: ADEQUATE

## 2019-08-29 DIAGNOSIS — A4181 Sepsis due to Enterococcus: Secondary | ICD-10-CM | POA: Diagnosis not present

## 2019-08-29 DIAGNOSIS — I2510 Atherosclerotic heart disease of native coronary artery without angina pectoris: Secondary | ICD-10-CM | POA: Diagnosis not present

## 2019-08-29 DIAGNOSIS — N390 Urinary tract infection, site not specified: Secondary | ICD-10-CM | POA: Diagnosis not present

## 2019-08-29 DIAGNOSIS — T83511A Infection and inflammatory reaction due to indwelling urethral catheter, initial encounter: Secondary | ICD-10-CM | POA: Diagnosis not present

## 2019-08-29 DIAGNOSIS — I4891 Unspecified atrial fibrillation: Secondary | ICD-10-CM | POA: Diagnosis not present

## 2019-08-29 DIAGNOSIS — Z452 Encounter for adjustment and management of vascular access device: Secondary | ICD-10-CM | POA: Diagnosis not present

## 2019-09-01 ENCOUNTER — Other Ambulatory Visit: Payer: Self-pay | Admitting: *Deleted

## 2019-09-01 NOTE — Patient Outreach (Signed)
[facility]) Care Management  09/01/2019  Corey Oneill [birth year] [id number]    EMMI-GENERAL DISCHARGE RED ON EMMI ALERT Day # 1 Date: 08/30/2019 Red Alert Reason: Have not read d/c paperwork/don't know who to call with changes in his condition  Outreach #1 RN attempted outreach call today however unsuccessful. RN able to leave a HIPAA approved voice message requesting a call back. Will further engage at that time.  PLAN: Will follow up in 4 business days with another outreach call.  Raina Mina, RN Care Management Coordinator [place] Office [phone]

## 2019-09-02 ENCOUNTER — Encounter: Payer: Self-pay | Admitting: Internal Medicine

## 2019-09-02 DIAGNOSIS — A4181 Sepsis due to Enterococcus: Secondary | ICD-10-CM | POA: Diagnosis not present

## 2019-09-02 DIAGNOSIS — T83511A Infection and inflammatory reaction due to indwelling urethral catheter, initial encounter: Secondary | ICD-10-CM | POA: Diagnosis not present

## 2019-09-02 DIAGNOSIS — I2510 Atherosclerotic heart disease of native coronary artery without angina pectoris: Secondary | ICD-10-CM | POA: Diagnosis not present

## 2019-09-02 DIAGNOSIS — A419 Sepsis, unspecified organism: Secondary | ICD-10-CM | POA: Diagnosis not present

## 2019-09-02 DIAGNOSIS — I4891 Unspecified atrial fibrillation: Secondary | ICD-10-CM | POA: Diagnosis not present

## 2019-09-02 DIAGNOSIS — Z452 Encounter for adjustment and management of vascular access device: Secondary | ICD-10-CM | POA: Diagnosis not present

## 2019-09-02 DIAGNOSIS — N390 Urinary tract infection, site not specified: Secondary | ICD-10-CM | POA: Diagnosis not present

## 2019-09-03 ENCOUNTER — Encounter: Payer: Self-pay | Admitting: Family Medicine

## 2019-09-03 ENCOUNTER — Other Ambulatory Visit: Payer: Self-pay

## 2019-09-03 ENCOUNTER — Ambulatory Visit (INDEPENDENT_AMBULATORY_CARE_PROVIDER_SITE_OTHER): Payer: Medicare Other | Admitting: Family Medicine

## 2019-09-03 VITALS — BP 142/70 | HR 76 | Temp 97.8°F | Wt 147.6 lb

## 2019-09-03 DIAGNOSIS — A419 Sepsis, unspecified organism: Secondary | ICD-10-CM

## 2019-09-03 DIAGNOSIS — N35012 Post-traumatic membranous urethral stricture: Secondary | ICD-10-CM | POA: Diagnosis not present

## 2019-09-03 DIAGNOSIS — I209 Angina pectoris, unspecified: Secondary | ICD-10-CM

## 2019-09-03 NOTE — Progress Notes (Signed)
   Subjective:    Patient ID: Corey Oneill, male    DOB: June 28, 1936, 83 y.o.   MRN: [id number]  HPI He is here for follow-up visit.  He was recently hospitalized and discharged on October 14.  He did have sepsis from Enterococcus faecalis.  He is now on IV continuous antibiotic.  He states he is doing much better with no fever, chills.  He says he is eating and sleeping back to normal.  He does have a nurse come by to check the IV site and also physical therapy.  He has an appointment with infectious disease on October 29 and with urology on the 30th.  He is here mainly to make sure everything is going well.   Review of Systems     Objective:   Physical Exam Alert and in no distress.  The IV site appears normal.       Assessment & Plan:  Sepsis, due to unspecified organism, unspecified whether acute organ dysfunction present [facility]) I discussed what is happening with him.  Explained that he needs to continue with present therapy and follow-up with infectious disease and with urology.

## 2019-09-04 ENCOUNTER — Other Ambulatory Visit: Payer: Self-pay | Admitting: *Deleted

## 2019-09-04 DIAGNOSIS — N390 Urinary tract infection, site not specified: Secondary | ICD-10-CM | POA: Diagnosis not present

## 2019-09-04 DIAGNOSIS — I4891 Unspecified atrial fibrillation: Secondary | ICD-10-CM | POA: Diagnosis not present

## 2019-09-04 DIAGNOSIS — T83511A Infection and inflammatory reaction due to indwelling urethral catheter, initial encounter: Secondary | ICD-10-CM | POA: Diagnosis not present

## 2019-09-04 DIAGNOSIS — I2510 Atherosclerotic heart disease of native coronary artery without angina pectoris: Secondary | ICD-10-CM | POA: Diagnosis not present

## 2019-09-04 DIAGNOSIS — Z452 Encounter for adjustment and management of vascular access device: Secondary | ICD-10-CM | POA: Diagnosis not present

## 2019-09-04 DIAGNOSIS — A4181 Sepsis due to Enterococcus: Secondary | ICD-10-CM | POA: Diagnosis not present

## 2019-09-04 NOTE — Patient Outreach (Signed)
[facility]) Care Management  09/04/2019  Corey Oneill 09/20/1936 [id number]     EMMI-GENERAL DISCHARGE RED ON EMMI ALERT Day #1 Date: 08/30/2019 Red Alert Reason: D/C paperwork/Know who to call with any changes  OUTREACH #1 RN contacted pt today and introduced [facility] services and purpose for the call today. RN inquired on the RED EMMI's with the above issues. Pt indicated he had a visit on yesterday and all paperwork was discussed. All issues resolved with no additional problems or needs to address at this time.   PLAN: Case will be closed with all EMMI reds resolved.  Raina Mina, RN Care Management Coordinator [place] Office [phone]

## 2019-09-05 DIAGNOSIS — N390 Urinary tract infection, site not specified: Secondary | ICD-10-CM | POA: Diagnosis not present

## 2019-09-05 DIAGNOSIS — A4181 Sepsis due to Enterococcus: Secondary | ICD-10-CM | POA: Diagnosis not present

## 2019-09-05 DIAGNOSIS — Z452 Encounter for adjustment and management of vascular access device: Secondary | ICD-10-CM | POA: Diagnosis not present

## 2019-09-05 DIAGNOSIS — I4891 Unspecified atrial fibrillation: Secondary | ICD-10-CM | POA: Diagnosis not present

## 2019-09-05 DIAGNOSIS — I2510 Atherosclerotic heart disease of native coronary artery without angina pectoris: Secondary | ICD-10-CM | POA: Diagnosis not present

## 2019-09-05 DIAGNOSIS — T83511A Infection and inflammatory reaction due to indwelling urethral catheter, initial encounter: Secondary | ICD-10-CM | POA: Diagnosis not present

## 2019-09-08 ENCOUNTER — Telehealth: Payer: Self-pay | Admitting: *Deleted

## 2019-09-08 ENCOUNTER — Encounter: Payer: Self-pay | Admitting: *Deleted

## 2019-09-08 NOTE — Telephone Encounter (Signed)
Patient called to ask if he can come by the office and have his PICC or Port removed. He reports he has a device attached to him that is "pumping in poison" into him and he wants it out. Asked the patient when his last day for the antibiotic is and he responded he is not sure but he has a visit her 09/11/19 and just wants to get it out today if possible.   After a little research found that the patient has his first visit here with Dr Linus Salmons 09/11/19 and he is getting Ampicillin through IV until at least 09/19/19.   Advised the patient will have to check with provider and give him a call back once he responds.

## 2019-09-08 NOTE — Telephone Encounter (Signed)
This encounter was created in error - please disregard.

## 2019-09-09 DIAGNOSIS — N390 Urinary tract infection, site not specified: Secondary | ICD-10-CM | POA: Diagnosis not present

## 2019-09-09 DIAGNOSIS — A4181 Sepsis due to Enterococcus: Secondary | ICD-10-CM | POA: Diagnosis not present

## 2019-09-09 DIAGNOSIS — Z452 Encounter for adjustment and management of vascular access device: Secondary | ICD-10-CM | POA: Diagnosis not present

## 2019-09-09 DIAGNOSIS — A419 Sepsis, unspecified organism: Secondary | ICD-10-CM | POA: Diagnosis not present

## 2019-09-09 DIAGNOSIS — I2510 Atherosclerotic heart disease of native coronary artery without angina pectoris: Secondary | ICD-10-CM | POA: Diagnosis not present

## 2019-09-09 DIAGNOSIS — T83511A Infection and inflammatory reaction due to indwelling urethral catheter, initial encounter: Secondary | ICD-10-CM | POA: Diagnosis not present

## 2019-09-09 DIAGNOSIS — I4891 Unspecified atrial fibrillation: Secondary | ICD-10-CM | POA: Diagnosis not present

## 2019-09-09 NOTE — Telephone Encounter (Signed)
He should keep it until 11/6.  thanks

## 2019-09-10 NOTE — Telephone Encounter (Signed)
Called patient to tell him he will need to keep line until 09/19/19 as scheduled. Had to leave him a message to call the office back.

## 2019-09-11 ENCOUNTER — Other Ambulatory Visit: Payer: Self-pay

## 2019-09-11 ENCOUNTER — Encounter: Payer: Self-pay | Admitting: Internal Medicine

## 2019-09-11 ENCOUNTER — Ambulatory Visit (INDEPENDENT_AMBULATORY_CARE_PROVIDER_SITE_OTHER): Payer: Medicare Other | Admitting: Internal Medicine

## 2019-09-11 ENCOUNTER — Telehealth: Payer: Self-pay

## 2019-09-11 VITALS — BP 149/81 | HR 73 | Temp 98.0°F | Ht 70.0 in | Wt 143.0 lb

## 2019-09-11 DIAGNOSIS — I209 Angina pectoris, unspecified: Secondary | ICD-10-CM

## 2019-09-11 DIAGNOSIS — A4181 Sepsis due to Enterococcus: Secondary | ICD-10-CM | POA: Diagnosis not present

## 2019-09-11 DIAGNOSIS — N390 Urinary tract infection, site not specified: Secondary | ICD-10-CM | POA: Diagnosis not present

## 2019-09-11 DIAGNOSIS — I2510 Atherosclerotic heart disease of native coronary artery without angina pectoris: Secondary | ICD-10-CM | POA: Diagnosis not present

## 2019-09-11 DIAGNOSIS — T83511A Infection and inflammatory reaction due to indwelling urethral catheter, initial encounter: Secondary | ICD-10-CM | POA: Diagnosis not present

## 2019-09-11 DIAGNOSIS — Z452 Encounter for adjustment and management of vascular access device: Secondary | ICD-10-CM | POA: Insufficient documentation

## 2019-09-11 DIAGNOSIS — Z952 Presence of prosthetic heart valve: Secondary | ICD-10-CM | POA: Diagnosis not present

## 2019-09-11 DIAGNOSIS — R7881 Bacteremia: Secondary | ICD-10-CM | POA: Insufficient documentation

## 2019-09-11 DIAGNOSIS — B952 Enterococcus as the cause of diseases classified elsewhere: Secondary | ICD-10-CM | POA: Diagnosis not present

## 2019-09-11 DIAGNOSIS — I4891 Unspecified atrial fibrillation: Secondary | ICD-10-CM | POA: Diagnosis not present

## 2019-09-11 NOTE — Telephone Encounter (Signed)
Pt came in advising office that he needs a follow up appt due to hospital follow up . I don't see a note form the hospital recently and his last hospital follow up has already pt  been seen . Please advise [facility]

## 2019-09-11 NOTE — Assessment & Plan Note (Signed)
Cleared and tolerating current treatment.  He will complete the full 4 weeks and stop.  If he develops any new concerns including fever, chills, repeat blood cultures should be sent.  He will call with any concerns like that or go to his PCP.   Otherwise he can follow up PRN

## 2019-09-11 NOTE — Assessment & Plan Note (Signed)
TEE was not able to be done so he is on a prolonged treatment.  No evidence of endocarditis on TTE.   He will follow up with cardiology for any further monitoring with a TTE, as indicated.

## 2019-09-11 NOTE — Progress Notes (Signed)
   Subjective:    Patient ID: Corey Oneill, male    DOB: 12-08-1935, 83 y.o.   MRN: [id number]  HPI Here for hsfu Admitted with fever and found Enterococcus bacteremia.  Improved with ampicillin and with his history of a TAVR and aborted TEE, he is on a course of ampicillin for 4 weeks through November 6th.  Repeat blood cultures had remained ngtd.  Tolerating with no associated n/v/d.  No rash.     Review of Systems  Constitutional: Negative for chills, fatigue, fever and unexpected weight change.  Gastrointestinal: Negative for diarrhea.  Skin: Negative for rash.       Objective:   Physical Exam Constitutional:      Appearance: Normal appearance.  Eyes:     General: No scleral icterus. Cardiovascular:     Rate and Rhythm: Normal rate and regular rhythm.     Heart sounds: No murmur.  Pulmonary:     Effort: Pulmonary effort is normal.  Musculoskeletal:     Comments: picc line with no surrounding erythema  Skin:    Findings: No rash.     Comments: Blood blisters on his right hand  Neurological:     Mental Status: He is alert.  Psychiatric:        Mood and Affect: Mood normal.   SH: no tobacco        Assessment & Plan:

## 2019-09-11 NOTE — Assessment & Plan Note (Signed)
Working well.  Home health notified that this can be removed after his last dose on November 6th

## 2019-09-11 NOTE — Telephone Encounter (Signed)
Let him know that I did indeed follow-up with him.

## 2019-09-11 NOTE — Progress Notes (Signed)
LPN spoke with [place] with [facility] and gave verbal order per Dr. Linus Salmons to pull picc at the end of therapy on 09/19/19. Verbal orders read back and understood.  Corey Oneill

## 2019-09-12 ENCOUNTER — Telehealth: Payer: Self-pay | Admitting: *Deleted

## 2019-09-12 DIAGNOSIS — T83511A Infection and inflammatory reaction due to indwelling urethral catheter, initial encounter: Secondary | ICD-10-CM | POA: Diagnosis not present

## 2019-09-12 DIAGNOSIS — Z452 Encounter for adjustment and management of vascular access device: Secondary | ICD-10-CM | POA: Diagnosis not present

## 2019-09-12 DIAGNOSIS — A4181 Sepsis due to Enterococcus: Secondary | ICD-10-CM | POA: Diagnosis not present

## 2019-09-12 DIAGNOSIS — I4891 Unspecified atrial fibrillation: Secondary | ICD-10-CM | POA: Diagnosis not present

## 2019-09-12 DIAGNOSIS — N390 Urinary tract infection, site not specified: Secondary | ICD-10-CM | POA: Diagnosis not present

## 2019-09-12 DIAGNOSIS — I2510 Atherosclerotic heart disease of native coronary artery without angina pectoris: Secondary | ICD-10-CM | POA: Diagnosis not present

## 2019-09-12 NOTE — Telephone Encounter (Signed)
RN spoke with Dr Linus Salmons, no need to order potassium supplementation.  Will keep patient on ampicillin as long as his white blood cells stay above 2.0.  Current stop/pull date is 11/6.  If patient's white blood cell count drops to 2.0 or below, ok to stop therapy and pull PICC.  RN relayed verbal order to [place] at [facility], order repeated and verified.

## 2019-09-12 NOTE — Telephone Encounter (Signed)
I would repeat it today or Monday and if still low, under 3.2, give him 40 mEq of KCl for 3 days.   Thanks

## 2019-09-12 NOTE — Telephone Encounter (Signed)
Corey Oneill with [facility] called with critical lab value, WBC = 2.8 on 10/27. Previously 6.1 on 10/20, but per Cassie, has been as low as 3.8 while on ampicillin therapy. Current stop date 11/6.  Corey Oneill is calling to speak with the patient and see how he is feeling. Please advise if any changes. Landis Gandy, RN

## 2019-09-12 NOTE — Telephone Encounter (Signed)
Pt was advised Corey Oneill 

## 2019-09-15 DIAGNOSIS — I4891 Unspecified atrial fibrillation: Secondary | ICD-10-CM | POA: Diagnosis not present

## 2019-09-15 DIAGNOSIS — N390 Urinary tract infection, site not specified: Secondary | ICD-10-CM | POA: Diagnosis not present

## 2019-09-15 DIAGNOSIS — T83511A Infection and inflammatory reaction due to indwelling urethral catheter, initial encounter: Secondary | ICD-10-CM | POA: Diagnosis not present

## 2019-09-15 DIAGNOSIS — A4181 Sepsis due to Enterococcus: Secondary | ICD-10-CM | POA: Diagnosis not present

## 2019-09-15 DIAGNOSIS — Z452 Encounter for adjustment and management of vascular access device: Secondary | ICD-10-CM | POA: Diagnosis not present

## 2019-09-15 DIAGNOSIS — I2510 Atherosclerotic heart disease of native coronary artery without angina pectoris: Secondary | ICD-10-CM | POA: Diagnosis not present

## 2019-09-16 ENCOUNTER — Encounter: Payer: Self-pay | Admitting: Internal Medicine

## 2019-09-16 DIAGNOSIS — A4181 Sepsis due to Enterococcus: Secondary | ICD-10-CM | POA: Diagnosis not present

## 2019-09-16 DIAGNOSIS — I4891 Unspecified atrial fibrillation: Secondary | ICD-10-CM | POA: Diagnosis not present

## 2019-09-16 DIAGNOSIS — T83511A Infection and inflammatory reaction due to indwelling urethral catheter, initial encounter: Secondary | ICD-10-CM | POA: Diagnosis not present

## 2019-09-16 DIAGNOSIS — N390 Urinary tract infection, site not specified: Secondary | ICD-10-CM | POA: Diagnosis not present

## 2019-09-16 DIAGNOSIS — Z452 Encounter for adjustment and management of vascular access device: Secondary | ICD-10-CM | POA: Diagnosis not present

## 2019-09-16 DIAGNOSIS — I2510 Atherosclerotic heart disease of native coronary artery without angina pectoris: Secondary | ICD-10-CM | POA: Diagnosis not present

## 2019-09-17 DIAGNOSIS — I4891 Unspecified atrial fibrillation: Secondary | ICD-10-CM | POA: Diagnosis not present

## 2019-09-17 DIAGNOSIS — N390 Urinary tract infection, site not specified: Secondary | ICD-10-CM | POA: Diagnosis not present

## 2019-09-17 DIAGNOSIS — A4181 Sepsis due to Enterococcus: Secondary | ICD-10-CM | POA: Diagnosis not present

## 2019-09-17 DIAGNOSIS — Z452 Encounter for adjustment and management of vascular access device: Secondary | ICD-10-CM | POA: Diagnosis not present

## 2019-09-17 DIAGNOSIS — I2510 Atherosclerotic heart disease of native coronary artery without angina pectoris: Secondary | ICD-10-CM | POA: Diagnosis not present

## 2019-09-17 DIAGNOSIS — T83511A Infection and inflammatory reaction due to indwelling urethral catheter, initial encounter: Secondary | ICD-10-CM | POA: Diagnosis not present

## 2019-09-19 DIAGNOSIS — A4181 Sepsis due to Enterococcus: Secondary | ICD-10-CM | POA: Diagnosis not present

## 2019-09-19 DIAGNOSIS — I4891 Unspecified atrial fibrillation: Secondary | ICD-10-CM | POA: Diagnosis not present

## 2019-09-19 DIAGNOSIS — Z452 Encounter for adjustment and management of vascular access device: Secondary | ICD-10-CM | POA: Diagnosis not present

## 2019-09-19 DIAGNOSIS — I2510 Atherosclerotic heart disease of native coronary artery without angina pectoris: Secondary | ICD-10-CM | POA: Diagnosis not present

## 2019-09-19 DIAGNOSIS — T83511A Infection and inflammatory reaction due to indwelling urethral catheter, initial encounter: Secondary | ICD-10-CM | POA: Diagnosis not present

## 2019-09-19 DIAGNOSIS — N390 Urinary tract infection, site not specified: Secondary | ICD-10-CM | POA: Diagnosis not present

## 2019-09-22 ENCOUNTER — Telehealth: Payer: Self-pay

## 2019-09-22 DIAGNOSIS — Z79899 Other long term (current) drug therapy: Secondary | ICD-10-CM | POA: Diagnosis not present

## 2019-09-22 DIAGNOSIS — L120 Bullous pemphigoid: Secondary | ICD-10-CM | POA: Diagnosis not present

## 2019-09-22 NOTE — Telephone Encounter (Signed)
Patient called office with questions regarding pump for antibiotics. States that he has completed his therapy, and was wondering when someone would pick the pump up. Patient states he was told someone would pick it up after completing antibiotics. Called [facility] to follow up on patient's call regarding pump. Spoke with Coretta who states she will reach out to patient to schedule pick up time. Corey Oneill

## 2019-09-23 ENCOUNTER — Ambulatory Visit (INDEPENDENT_AMBULATORY_CARE_PROVIDER_SITE_OTHER): Payer: Medicare Other | Admitting: Family Medicine

## 2019-09-23 ENCOUNTER — Other Ambulatory Visit: Payer: Self-pay

## 2019-09-23 ENCOUNTER — Encounter: Payer: Self-pay | Admitting: Family Medicine

## 2019-09-23 VITALS — BP 112/68 | HR 89 | Temp 98.2°F | Wt 146.0 lb

## 2019-09-23 DIAGNOSIS — L120 Bullous pemphigoid: Secondary | ICD-10-CM

## 2019-09-23 DIAGNOSIS — N390 Urinary tract infection, site not specified: Secondary | ICD-10-CM | POA: Diagnosis not present

## 2019-09-23 DIAGNOSIS — I2510 Atherosclerotic heart disease of native coronary artery without angina pectoris: Secondary | ICD-10-CM | POA: Diagnosis not present

## 2019-09-23 DIAGNOSIS — R2681 Unsteadiness on feet: Secondary | ICD-10-CM | POA: Diagnosis not present

## 2019-09-23 DIAGNOSIS — Z452 Encounter for adjustment and management of vascular access device: Secondary | ICD-10-CM | POA: Diagnosis not present

## 2019-09-23 DIAGNOSIS — T83511A Infection and inflammatory reaction due to indwelling urethral catheter, initial encounter: Secondary | ICD-10-CM | POA: Diagnosis not present

## 2019-09-23 DIAGNOSIS — I4891 Unspecified atrial fibrillation: Secondary | ICD-10-CM | POA: Diagnosis not present

## 2019-09-23 DIAGNOSIS — A4181 Sepsis due to Enterococcus: Secondary | ICD-10-CM | POA: Diagnosis not present

## 2019-09-23 NOTE — Progress Notes (Signed)
   Subjective:    Patient ID: Corey Oneill, male    DOB: August 23, 1936, 83 y.o.   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  He is presently on methotrexate on a weekly basis.  He was recently seen by dermatology.  That note was reviewed.  He also apparently feels unsteady on his feet and is now using a cane.  He wanted to go over all these issues to keep me informed.  He also states that he fell and landed on his left shoulder but is having no pain with that.   Review of Systems     Objective:   Physical Exam Alert and in no distress.  Multiple bullous lesions are noted on the hands.  No palpable tenderness to the left shoulder.       Assessment & Plan:  Bullous pemphigoid  Unsteady gait I reviewed the medicines with him and encouraged him to stay with the present medicine regimen.  He essentially needed reinforcement that he was doing the right thing.

## 2019-09-25 ENCOUNTER — Other Ambulatory Visit: Payer: Self-pay

## 2019-09-25 ENCOUNTER — Emergency Department (HOSPITAL_COMMUNITY): Payer: Medicare Other

## 2019-09-25 ENCOUNTER — Inpatient Hospital Stay (HOSPITAL_COMMUNITY)
Admission: EM | Admit: 2019-09-25 | Discharge: 2019-09-30 | DRG: 872 | Disposition: A | Discharge status: Skilled Nursing Facility | Payer: Medicare Other | Attending: Internal Medicine | Admitting: Internal Medicine

## 2019-09-25 ENCOUNTER — Encounter (HOSPITAL_COMMUNITY): Payer: Self-pay

## 2019-09-25 DIAGNOSIS — M6281 Muscle weakness (generalized): Secondary | ICD-10-CM | POA: Diagnosis not present

## 2019-09-25 DIAGNOSIS — B952 Enterococcus as the cause of diseases classified elsewhere: Secondary | ICD-10-CM | POA: Diagnosis not present

## 2019-09-25 DIAGNOSIS — Z87891 Personal history of nicotine dependence: Secondary | ICD-10-CM | POA: Diagnosis not present

## 2019-09-25 DIAGNOSIS — R6510 Systemic inflammatory response syndrome (SIRS) of non-infectious origin without acute organ dysfunction: Secondary | ICD-10-CM | POA: Diagnosis not present

## 2019-09-25 DIAGNOSIS — Z8546 Personal history of malignant neoplasm of prostate: Secondary | ICD-10-CM

## 2019-09-25 DIAGNOSIS — F05 Delirium due to known physiological condition: Secondary | ICD-10-CM | POA: Diagnosis present

## 2019-09-25 DIAGNOSIS — F329 Major depressive disorder, single episode, unspecified: Secondary | ICD-10-CM | POA: Diagnosis not present

## 2019-09-25 DIAGNOSIS — N390 Urinary tract infection, site not specified: Secondary | ICD-10-CM | POA: Diagnosis not present

## 2019-09-25 DIAGNOSIS — E785 Hyperlipidemia, unspecified: Secondary | ICD-10-CM | POA: Diagnosis not present

## 2019-09-25 DIAGNOSIS — C61 Malignant neoplasm of prostate: Secondary | ICD-10-CM | POA: Diagnosis not present

## 2019-09-25 DIAGNOSIS — D539 Nutritional anemia, unspecified: Secondary | ICD-10-CM | POA: Diagnosis present

## 2019-09-25 DIAGNOSIS — Z955 Presence of coronary angioplasty implant and graft: Secondary | ICD-10-CM

## 2019-09-25 DIAGNOSIS — Z923 Personal history of irradiation: Secondary | ICD-10-CM

## 2019-09-25 DIAGNOSIS — D693 Immune thrombocytopenic purpura: Secondary | ICD-10-CM | POA: Diagnosis present

## 2019-09-25 DIAGNOSIS — I700 Atherosclerosis of aorta: Secondary | ICD-10-CM | POA: Diagnosis present

## 2019-09-25 DIAGNOSIS — F1010 Alcohol abuse, uncomplicated: Secondary | ICD-10-CM | POA: Diagnosis present

## 2019-09-25 DIAGNOSIS — K769 Liver disease, unspecified: Secondary | ICD-10-CM | POA: Diagnosis present

## 2019-09-25 DIAGNOSIS — Z20828 Contact with and (suspected) exposure to other viral communicable diseases: Secondary | ICD-10-CM | POA: Diagnosis present

## 2019-09-25 DIAGNOSIS — E871 Hypo-osmolality and hyponatremia: Secondary | ICD-10-CM | POA: Diagnosis present

## 2019-09-25 DIAGNOSIS — F0390 Unspecified dementia without behavioral disturbance: Secondary | ICD-10-CM | POA: Diagnosis present

## 2019-09-25 DIAGNOSIS — R1312 Dysphagia, oropharyngeal phase: Secondary | ICD-10-CM | POA: Diagnosis present

## 2019-09-25 DIAGNOSIS — Z79899 Other long term (current) drug therapy: Secondary | ICD-10-CM

## 2019-09-25 DIAGNOSIS — A419 Sepsis, unspecified organism: Secondary | ICD-10-CM

## 2019-09-25 DIAGNOSIS — I2510 Atherosclerotic heart disease of native coronary artery without angina pectoris: Secondary | ICD-10-CM | POA: Diagnosis not present

## 2019-09-25 DIAGNOSIS — I371 Nonrheumatic pulmonary valve insufficiency: Secondary | ICD-10-CM | POA: Diagnosis not present

## 2019-09-25 DIAGNOSIS — I330 Acute and subacute infective endocarditis: Secondary | ICD-10-CM | POA: Diagnosis not present

## 2019-09-25 DIAGNOSIS — R278 Other lack of coordination: Secondary | ICD-10-CM | POA: Diagnosis not present

## 2019-09-25 DIAGNOSIS — Z8619 Personal history of other infectious and parasitic diseases: Secondary | ICD-10-CM | POA: Diagnosis not present

## 2019-09-25 DIAGNOSIS — M2550 Pain in unspecified joint: Secondary | ICD-10-CM | POA: Diagnosis not present

## 2019-09-25 DIAGNOSIS — R4182 Altered mental status, unspecified: Secondary | ICD-10-CM | POA: Diagnosis not present

## 2019-09-25 DIAGNOSIS — Z9079 Acquired absence of other genital organ(s): Secondary | ICD-10-CM

## 2019-09-25 DIAGNOSIS — E876 Hypokalemia: Secondary | ICD-10-CM | POA: Diagnosis present

## 2019-09-25 DIAGNOSIS — Z952 Presence of prosthetic heart valve: Secondary | ICD-10-CM | POA: Diagnosis not present

## 2019-09-25 DIAGNOSIS — F341 Dysthymic disorder: Secondary | ICD-10-CM | POA: Diagnosis not present

## 2019-09-25 DIAGNOSIS — Z66 Do not resuscitate: Secondary | ICD-10-CM | POA: Diagnosis present

## 2019-09-25 DIAGNOSIS — R410 Disorientation, unspecified: Secondary | ICD-10-CM | POA: Diagnosis not present

## 2019-09-25 DIAGNOSIS — Z808 Family history of malignant neoplasm of other organs or systems: Secondary | ICD-10-CM

## 2019-09-25 DIAGNOSIS — F32A Depression, unspecified: Secondary | ICD-10-CM | POA: Diagnosis present

## 2019-09-25 DIAGNOSIS — L120 Bullous pemphigoid: Secondary | ICD-10-CM | POA: Diagnosis not present

## 2019-09-25 DIAGNOSIS — I5032 Chronic diastolic (congestive) heart failure: Secondary | ICD-10-CM | POA: Diagnosis present

## 2019-09-25 DIAGNOSIS — K219 Gastro-esophageal reflux disease without esophagitis: Secondary | ICD-10-CM | POA: Diagnosis present

## 2019-09-25 DIAGNOSIS — Z7401 Bed confinement status: Secondary | ICD-10-CM | POA: Diagnosis not present

## 2019-09-25 DIAGNOSIS — C7951 Secondary malignant neoplasm of bone: Secondary | ICD-10-CM | POA: Diagnosis present

## 2019-09-25 DIAGNOSIS — E538 Deficiency of other specified B group vitamins: Secondary | ICD-10-CM | POA: Diagnosis present

## 2019-09-25 DIAGNOSIS — C799 Secondary malignant neoplasm of unspecified site: Secondary | ICD-10-CM | POA: Diagnosis not present

## 2019-09-25 DIAGNOSIS — A4181 Sepsis due to Enterococcus: Secondary | ICD-10-CM | POA: Diagnosis present

## 2019-09-25 DIAGNOSIS — R2681 Unsteadiness on feet: Secondary | ICD-10-CM | POA: Diagnosis not present

## 2019-09-25 DIAGNOSIS — R531 Weakness: Secondary | ICD-10-CM | POA: Diagnosis not present

## 2019-09-25 DIAGNOSIS — I110 Hypertensive heart disease with heart failure: Secondary | ICD-10-CM | POA: Diagnosis present

## 2019-09-25 DIAGNOSIS — Z888 Allergy status to other drugs, medicaments and biological substances status: Secondary | ICD-10-CM

## 2019-09-25 DIAGNOSIS — I350 Nonrheumatic aortic (valve) stenosis: Secondary | ICD-10-CM | POA: Diagnosis not present

## 2019-09-25 DIAGNOSIS — R5381 Other malaise: Secondary | ICD-10-CM | POA: Diagnosis not present

## 2019-09-25 DIAGNOSIS — R0902 Hypoxemia: Secondary | ICD-10-CM | POA: Diagnosis not present

## 2019-09-25 DIAGNOSIS — Z818 Family history of other mental and behavioral disorders: Secondary | ICD-10-CM

## 2019-09-25 DIAGNOSIS — R1311 Dysphagia, oral phase: Secondary | ICD-10-CM | POA: Diagnosis not present

## 2019-09-25 DIAGNOSIS — I38 Endocarditis, valve unspecified: Secondary | ICD-10-CM | POA: Diagnosis not present

## 2019-09-25 DIAGNOSIS — R7881 Bacteremia: Secondary | ICD-10-CM | POA: Diagnosis not present

## 2019-09-25 DIAGNOSIS — Z7982 Long term (current) use of aspirin: Secondary | ICD-10-CM

## 2019-09-25 DIAGNOSIS — Z85820 Personal history of malignant melanoma of skin: Secondary | ICD-10-CM

## 2019-09-25 DIAGNOSIS — R404 Transient alteration of awareness: Secondary | ICD-10-CM | POA: Diagnosis present

## 2019-09-25 LAB — CBC WITH DIFFERENTIAL/PLATELET
Abs Immature Granulocytes: 0.01 10*3/uL (ref 0.00–0.07)
Basophils Absolute: 0 10*3/uL (ref 0.0–0.1)
Basophils Relative: 0 %
Eosinophils Absolute: 0 10*3/uL (ref 0.0–0.5)
Eosinophils Relative: 0 %
HCT: 36.4 % — ABNORMAL LOW (ref 39.0–52.0)
Hemoglobin: 11.9 g/dL — ABNORMAL LOW (ref 13.0–17.0)
Immature Granulocytes: 0 %
Lymphocytes Relative: 9 %
Lymphs Abs: 0.4 10*3/uL — ABNORMAL LOW (ref 0.7–4.0)
MCH: 32.9 pg (ref 26.0–34.0)
MCHC: 32.7 g/dL (ref 30.0–36.0)
MCV: 100.6 fL — ABNORMAL HIGH (ref 80.0–100.0)
Monocytes Absolute: 0.4 10*3/uL (ref 0.1–1.0)
Monocytes Relative: 8 %
Neutro Abs: 3.6 10*3/uL (ref 1.7–7.7)
Neutrophils Relative %: 83 %
Platelets: 37 10*3/uL — ABNORMAL LOW (ref 150–400)
RBC: 3.62 MIL/uL — ABNORMAL LOW (ref 4.22–5.81)
RDW: 13.9 % (ref 11.5–15.5)
WBC: 4.3 10*3/uL (ref 4.0–10.5)
nRBC: 0 % (ref 0.0–0.2)

## 2019-09-25 LAB — URINALYSIS, ROUTINE W REFLEX MICROSCOPIC
Bacteria, UA: NONE SEEN
Bilirubin Urine: NEGATIVE
Glucose, UA: NEGATIVE mg/dL
Ketones, ur: 5 mg/dL — AB
Leukocytes,Ua: NEGATIVE
Nitrite: NEGATIVE
Protein, ur: 30 mg/dL — AB
Specific Gravity, Urine: 1.016 (ref 1.005–1.030)
pH: 7 (ref 5.0–8.0)

## 2019-09-25 LAB — COMPREHENSIVE METABOLIC PANEL
ALT: 9 U/L (ref 0–44)
AST: 17 U/L (ref 15–41)
Albumin: 3.3 g/dL — ABNORMAL LOW (ref 3.5–5.0)
Alkaline Phosphatase: 70 U/L (ref 38–126)
Anion gap: 11 (ref 5–15)
BUN: 10 mg/dL (ref 8–23)
CO2: 23 mmol/L (ref 22–32)
Calcium: 8 mg/dL — ABNORMAL LOW (ref 8.9–10.3)
Chloride: 99 mmol/L (ref 98–111)
Creatinine, Ser: 0.8 mg/dL (ref 0.61–1.24)
GFR calc Af Amer: >60 mL/min (ref >60)
GFR calc non Af Amer: >60 mL/min (ref >60)
Glucose, Bld: 106 mg/dL — ABNORMAL HIGH (ref 70–99)
Potassium: 3.8 mmol/L (ref 3.5–5.1)
Sodium: 133 mmol/L — ABNORMAL LOW (ref 135–145)
Total Bilirubin: 0.8 mg/dL (ref 0.3–1.2)
Total Protein: 6.8 g/dL (ref 6.5–8.1)

## 2019-09-25 LAB — AMMONIA: Ammonia: 27 umol/L (ref 9–35)

## 2019-09-25 LAB — APTT: aPTT: 38 seconds — ABNORMAL HIGH (ref 24–36)

## 2019-09-25 LAB — CBG MONITORING, ED: Glucose-Capillary: 87 mg/dL (ref 70–99)

## 2019-09-25 LAB — ETHANOL: Alcohol, Ethyl (B): <10 mg/dL (ref <10)

## 2019-09-25 LAB — TROPONIN I (HIGH SENSITIVITY): Troponin I (High Sensitivity): 9 ng/L (ref <18)

## 2019-09-25 LAB — LACTIC ACID, PLASMA: Lactic Acid, Venous: 1.3 mmol/L (ref 0.5–1.9)

## 2019-09-25 LAB — VITAMIN B12: Vitamin B-12: 142 pg/mL — ABNORMAL LOW (ref 180–914)

## 2019-09-25 LAB — MAGNESIUM: Magnesium: 2 mg/dL (ref 1.7–2.4)

## 2019-09-25 LAB — PHOSPHORUS: Phosphorus: 1.9 mg/dL — ABNORMAL LOW (ref 2.5–4.6)

## 2019-09-25 LAB — TSH: TSH: 1.62 u[IU]/mL (ref 0.350–4.500)

## 2019-09-25 LAB — PROTIME-INR
INR: 1.2 (ref 0.8–1.2)
Prothrombin Time: 14.8 seconds (ref 11.4–15.2)

## 2019-09-25 LAB — SARS CORONAVIRUS 2 (TAT 6-24 HRS): SARS Coronavirus 2: NEGATIVE

## 2019-09-25 MED ORDER — HALOPERIDOL LACTATE 5 MG/ML IJ SOLN
2.0000 mg | Freq: Once | INTRAMUSCULAR | Status: AC
  Administered 2019-09-25: 2 mg via INTRAVENOUS
  Filled 2019-09-25: qty 1

## 2019-09-25 MED ORDER — VANCOMYCIN HCL IN DEXTROSE 750-5 MG/150ML-% IV SOLN
750.0000 mg | Freq: Two times a day (BID) | INTRAVENOUS | Status: DC
  Administered 2019-09-26: 750 mg via INTRAVENOUS
  Filled 2019-09-25 (×3): qty 150

## 2019-09-25 MED ORDER — ACETAMINOPHEN 325 MG PO TABS: 650.0000 mg | ORAL_TABLET | Freq: Four times a day (QID) | ORAL | Status: DC | PRN

## 2019-09-25 MED ORDER — ACETAMINOPHEN 650 MG RE SUPP: 650.0000 mg | Freq: Four times a day (QID) | RECTAL | Status: DC | PRN

## 2019-09-25 MED ORDER — LABETALOL HCL 5 MG/ML IV SOLN: 10.0000 mg | Freq: Four times a day (QID) | INTRAVENOUS | Status: DC | PRN

## 2019-09-25 MED ORDER — ONDANSETRON HCL 4 MG PO TABS: 4.0000 mg | ORAL_TABLET | Freq: Four times a day (QID) | ORAL | Status: DC | PRN

## 2019-09-25 MED ORDER — SODIUM CHLORIDE 0.9 % IV SOLN
1.0000 g | Freq: Once | INTRAVENOUS | Status: AC
  Administered 2019-09-25: 1 g via INTRAVENOUS
  Filled 2019-09-25: qty 10

## 2019-09-25 MED ORDER — VANCOMYCIN HCL IN DEXTROSE 1-5 GM/200ML-% IV SOLN: 1000.0000 mg | Freq: Three times a day (TID) | INTRAVENOUS | Status: DC

## 2019-09-25 MED ORDER — SODIUM CHLORIDE 0.9 % IV SOLN: 1.0000 g | INTRAVENOUS | Status: DC

## 2019-09-25 MED ORDER — LACTATED RINGERS IV BOLUS
2000.0000 mL | Freq: Once | INTRAVENOUS | Status: AC
  Administered 2019-09-25: 2000 mL via INTRAVENOUS

## 2019-09-25 MED ORDER — SODIUM CHLORIDE 0.9 % IV SOLN
INTRAVENOUS | Status: DC
  Administered 2019-09-25: 15:00:00 via INTRAVENOUS

## 2019-09-25 MED ORDER — ONDANSETRON HCL 4 MG/2ML IJ SOLN: 4.0000 mg | Freq: Four times a day (QID) | INTRAMUSCULAR | Status: DC | PRN

## 2019-09-25 MED ORDER — CHLORHEXIDINE GLUCONATE CLOTH 2 % EX PADS
6.0000 | MEDICATED_PAD | Freq: Every day | CUTANEOUS | Status: DC
  Administered 2019-09-26 – 2019-09-30 (×4): 6 via TOPICAL

## 2019-09-25 MED ORDER — VANCOMYCIN HCL IN DEXTROSE 1-5 GM/200ML-% IV SOLN
1000.0000 mg | Freq: Once | INTRAVENOUS | Status: AC
  Administered 2019-09-25: 1000 mg via INTRAVENOUS
  Filled 2019-09-25: qty 200

## 2019-09-25 NOTE — ED Triage Notes (Addendum)
Pt's wife reported to EMS that she left pt home yesterday when she went to work (as usual) after she fixed him breakfast. She stated that he was still in bed & has been incontinent of urine in the bed & was acting confused more than his normal baseline when she returned home & was still that way this morning before called 911. Reported baseline is A/Ox4 with some dementia, upon arrivl to ED pt is A/Ox2- disoriented to location & situation, oral temp 99.1, on RA sating at 100%, EMS reports that pt was recently treated for Sepsis 3 weeks ago.

## 2019-09-25 NOTE — ED Provider Notes (Signed)
[facility] EMERGENCY DEPARTMENT Provider Note   CSN: [id number] Arrival date & time: 09/25/19  0820     History   Chief Complaint Chief Complaint  Patient presents with  . Altered Mental Status    HPI Corey Oneill is a 83 y.o. male.      Altered Mental Status Presenting symptoms: disorientation and lethargy   Severity:  Moderate Most recent episode:  Yesterday Episode history:  Continuous Duration:  24 hours Timing:  Constant Progression:  Worsening Chronicity:  New Context: dementia (Patient's baseline is typically alert, functional, oriented x4 according to the patient's wife.)   Context comment:  Patient was recently admitted to the hospital and found to have bacteremia for which she was initiated on antibiotics.  Discharged on Keflex and followed by infectious disease as an outpatient. Associated symptoms comment:  Patient denies any symptoms at this time.  He has reportedly been somnolent and bedridden today which is not his normal.   Past Medical History:  Diagnosis Date  . Abdominal aortic atherosclerosis ([place])   . Anginal pain ([place])    last noted 08/17/19  . Bone cancer ([place])    Left hip  . BPPV (benign paroxysmal positional vertigo)   . Bullous pemphigoid   . CAD (coronary artery disease)    a.  LHC 8/16: Mid to distal LAD 30%, OM1 40%, proximal mid RCA 40%, distal RCA 60% >> FFR 0.69  >> PCI: 3 x 15 mm Resolute DES  . Depression   . Esophageal reflux   . Family history of adverse reaction to anesthesia    "daughter has PONV"  . Grade I diastolic dysfunction [id number]   Noted on ECHO   . Grade I diastolic dysfunction [id number]   Noted on ECHO  . H/O blood clots    "get them in my stool and urine" (11/12/2015)  . Heart murmur   . Hepatic lesion 12/08/2015   Stable 8 mm right hepatic lesion  . History of aortic stenosis    a. peak to peak gradient by LHC 8/16:  37 mmHg (moderately severe)   . History of appendicitis 2016  .  History of blood transfusion 12/2018  . History of herpes labialis   . History of ITP    2018, thrombocytopenia  . History of kidney stones   . Hx of radiation therapy 04/14/13-06/09/13   prostate 7800 cGy, 40 sessions, seminal vesicles 5600 cGy 40 sessions  . Hydrocele 2006   Small  . Hyperlipidemia   . Insomnia    resolved  . LVH (left ventricular hypertrophy)    Moderate, noted on ECHO 09/2018  . 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 Melanoma ([place])    pt denies  . Metastasis from malignant neoplasm of prostate ([place]) 02/25/2019  . Prostate cancer ([place]) dx'd 2014  . Radiation proctitis   . Renal mass 2017   Bilateral renal masses  . S/P skin biopsy 04/09/2019   subepidermal cell poor vesicle , Linear IGG and C3 the basement membrane  . Suprapubic catheter ([place])   . Wears hearing aid in both ears     Patient Active Problem List   Diagnosis Date Noted  . Altered mental state 09/25/2019  . Idiopathic thrombocytopenic purpura (ITP) (H[facility]) 09/25/2019  . Macrocytic anemia 09/25/2019  . SIRS (systemic inflammatory response syndrome) ([place]) 09/25/2019  . Bacteremia due to Enterococcus 09/11/2019  . PICC (peripherally inserted central catheter) in place 09/11/2019  .  Sepsis ([place]) 08/21/2019  . Bullous pemphigoid 06/12/2019  . Metastasis from malignant neoplasm of prostate ([place]) 02/25/2019  . Radiation proctitis   . Hx of radiation therapy   . Heart murmur   . H/O blood clots   . Family history of adverse reaction to anesthesia   . CAD (coronary artery disease)   . Aortic stenosis   . Anginal pain ([place])   . History of ITP 02/26/2017  . 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 S/P appendectomy 11/12/2015  . Esophageal reflux 03/22/2015  . Depression 11/20/2014  . Prostate cancer ([place]) 03/05/2013  . Erectile dysfunction   .  Hyperlipidemia 06/07/2007    Past Surgical History:  Procedure Laterality Date  . CARDIAC CATHETERIZATION N/A 04/21/2015   Procedure: Right/Left Heart Cath and Coronary Angiography;  Surgeon: [name], MD;  Location: [facility];  Service: Cardiovascular;  Laterality: N/A;  . CARDIAC CATHETERIZATION N/A 06/18/2015   Procedure: Intravascular Pressure Wire/FFR Study;  Surgeon: [name], MD;  Location: [facility] [facility];  Service: Cardiovascular;  Laterality: N/A;  . CARDIAC CATHETERIZATION N/A 06/18/2015   Procedure: Coronary Stent Intervention;  Surgeon: [name], MD;  Location: [facility];  Service: Cardiovascular;  Laterality: N/A;  . CARDIAC CATHETERIZATION N/A 06/18/2015   Procedure: Right/Left Heart Cath and Coronary Angiography;  Surgeon: [name], MD;  Location: [facility];  Service: Cardiovascular;  Laterality: N/A;  . CARDIAC CATHETERIZATION N/A 06/23/2015   Procedure: Left Heart Cath and Cors/Grafts Angiography;  Surgeon: [name], MD;  Location: [place] [facility] LAB;  Service: Cardiovascular;  Laterality: N/A;  . CARDIAC CATHETERIZATION N/A 01/17/2016   Procedure: Right/Left Heart Cath and Coronary Angiography;  Surgeon: [name], MD;  Location: [facility];  Service: Cardiovascular;  Laterality: N/A;  . CATARACT EXTRACTION, BILATERAL    . COLONOSCOPY  06/26/2017  . CYSTOSCOPY WITH BIOPSY N/A 07/30/2018   Procedure: CYSTOSCOPY WITH BIOPSY/FULGURATION, CYSTOLTHOLAPAXY;  Surgeon: [name], MD;  Location[facility];  Service: Urology;  Laterality: N/A;  . CYSTOSCOPY WITH URETHRAL DILATATION N/A 08/18/2019   Procedure: CYSTOSCOPY WITH URETHRAL DILATATION USING BALLOON OR LASER/ SPURO PUBIC TUBE CHANGE LASER EXCISION OF URETHRAL STRICTURE RETROGRADE URETHROGRAM ANTEGRADE CYSTOGRAM;  Surgeon: [name], MD;  Location: [facility];  Service: Urology;  Laterality: N/A;  . FRACTURE SURGERY    . INGUINAL HERNIA  REPAIR     patient does not remember this procedure  . LAPAROSCOPIC APPENDECTOMY N/A 11/16/2015   Procedure: APPENDECTOMY LAPAROSCOPIC;  Surgeon: [name], MD;  Location: [facility];  Service: General;  Laterality: N/A;  . LEFT HEART CATH AND CORONARY ANGIOGRAPHY N/A 12/26/2016   Procedure: Left Heart Cath and Coronary Angiography;  Surgeon: Troy Sine, MD;  Location: [facility];  Service: Cardiovascular;  Laterality: N/A;  . MELANOMA EXCISION Right 10/24/2016   Procedure: WIDE EXCISION MELANOMA RIGHT NECK AND RIGHT CHEST TIMES 2;  Surgeon: [name], MD;  Location: [facility];  Service: General;  Laterality: Right;  right medial and lateral lesion of chest and right neck  . NASAL FRACTURE SURGERY     "broken years ago; several ORs to correct it"  . NASAL SEPTUM SURGERY    . PROSTATE BIOPSY  2014   "needle biopsy"  . TEE WITHOUT CARDIOVERSION N/A 02/15/2016   Procedure: TRANSESOPHAGEAL ECHOCARDIOGRAM (TEE);  Surgeon: [name], MD;  Location: [facility];  Service: Open Heart Surgery;  Laterality: N/A;  . TEE WITHOUT CARDIOVERSION N/A 08/26/2019   Procedure: TRANSESOPHAGEAL ECHOCARDIOGRAM (TEE);  Surgeon: Pixie Casino, MD;  Location: [facility];  Service: Cardiovascular;  Laterality: N/A;  . TONSILLECTOMY    . TRANSCATHETER AORTIC VALVE REPLACEMENT, TRANSFEMORAL  02/15/2016  . TRANSCATHETER AORTIC VALVE REPLACEMENT, TRANSFEMORAL N/A 02/15/2016   Procedure: TRANSCATHETER AORTIC VALVE REPLACEMENT, TRANSFEMORAL;  Surgeon: [name], MD;  Location: [facility];  Service: Open Heart Surgery;  Laterality: N/A;  . TRANSURETHRAL RESECTION OF PROSTATE  12/23/2018   Procedure: CYSTOSCOPY WITH  BLADDER BIOPSY WITH FULGERATION/ TRANSURETHRAL RESECTION PROSTATE  AND PROSTATE BIOPSY;  Surgeon: [name], MD;  Location: [facility];  Service: Urology;;        Home Medications    Prior to Admission medications   Medication Sig Start Date End Date Taking? Authorizing  Provider  acetaminophen (TYLENOL) 500 MG tablet Take 500 mg by mouth every 6 (six) hours as needed for moderate pain.    Yes [provider]  aspirin EC 81 MG tablet Take 81 mg by mouth at bedtime.    Yes [provider]  carvedilol (COREG) 3.125 MG tablet TAKE 1 TABLET BY MOUTH TWICE DAILY. Patient taking differently: Take 3.125 mg by mouth 2 (two) times daily.  06/26/19  Yes [name], MD  cyanocobalamin 2000 MCG tablet Take 2,000 mcg by mouth daily.   Yes [provider]  DULoxetine (CYMBALTA) 30 MG capsule Take 30 mg by mouth daily.   Yes [provider]  enzalutamide (XTANDI) 40 MG capsule Take 160 mg by mouth daily.   Yes [provider]  ferrous sulfate 325 (65 FE) MG EC tablet Take 1 tablet (325 mg total) by mouth daily with breakfast. If no side effects, change to twice daily after 1 week. Patient taking differently: Take 325 mg by mouth daily with breakfast.  12/24/18  Yes Bonnielee Haff, MD  folic acid (FOLVITE) 1 MG tablet Take 1 mg by mouth See admin instructions. Everyday but Tues   Yes [provider]  isosorbide mononitrate (IMDUR) 30 MG 24 hr tablet Take 1 tablet (30 mg total) by mouth daily. 10/22/18 10/22/19 Yes Weaver, Scott T, PA-C  methotrexate (RHEUMATREX) 2.5 MG tablet Take 10 mg by mouth every Tuesday.    Yes [provider]  MULTIPLE VITAMIN PO Take 1 tablet by mouth daily.   Yes [provider]  nitroGLYCERIN (NITROSTAT) 0.4 MG SL tablet 1 TAB UNDER TONGUE AS NEEDED FOR CHEST PAIN. MAY REPEAT EVERY 5 MIN FOR A TOTAL OF 3 DOSES. Patient taking differently: Place 0.4 mg under the tongue every 5 (five) minutes as needed for chest pain.  07/07/19  Yes [name], MD  rosuvastatin (CRESTOR) 10 MG tablet TAKE 1 TABLET ONCE DAILY. Patient taking differently: Take 10 mg by mouth daily.  05/26/19  Yes Tysinger, Camelia Eng, PA-C    Family History Family History  Problem Relation Age of Onset  . Brain  cancer Father   . Lymphoma Mother   . Depression Daughter     Social History Social History   Tobacco Use  . Smoking status: Former Smoker    Packs/day: 0.00    Types: Cigarettes  . Smokeless tobacco: Never Used  . Tobacco comment: "quit smoking cigarettes in the 1960s; quit smoking cigars in the 1990s  Substance Use Topics  . Alcohol use: Yes    Comment: 2 DRINKS PER DAY  .  Drug use: No     Allergies   Prednisone   Review of Systems Review of Systems  Unable to perform ROS: Mental status change     Physical Exam Updated Vital Signs BP (!) 156/83 (BP Location: Right Arm)   Pulse 100   Temp (!) 102.2 F (39 C) (Rectal)   Resp 20   Ht 5\' 10"  (1.778 m)   Wt 66.2 kg   SpO2 99%   BMI 20.95 kg/m   Physical Exam Vitals signs and nursing note reviewed.  Constitutional:      Appearance: He is well-developed. He is ill-appearing.     Comments: Patient is an 83 year old male who is alert but not oriented to person, place, or situation at this time  HENT:     Head: Normocephalic and atraumatic.  Eyes:     Conjunctiva/sclera: Conjunctivae normal.  Neck:     Musculoskeletal: Neck supple. No neck rigidity.  Cardiovascular:     Rate and Rhythm: Normal rate and regular rhythm.     Heart sounds: No murmur.  Pulmonary:     Effort: Pulmonary effort is normal. No respiratory distress.     Breath sounds: Normal breath sounds.  Abdominal:     General: Abdomen is flat.     Palpations: Abdomen is soft.     Tenderness: There is no abdominal tenderness.  Genitourinary:    Penis: Normal.      Scrotum/Testes: Normal.  Skin:    General: Skin is warm and dry.     Capillary Refill: Capillary refill takes less than 2 seconds.  Neurological:     Mental Status: He is alert.     Comments: Patient is unable to fully cooperate with neurologic exam due to his altered mental status.  He is noted to be moving all of his extremities.  Strength 5 out of 5 in all extremities proximally  and distally.  No focal neurologic deficit appreciated however patient has generalized altered mental status.      ED Treatments / Results  Labs (all labs ordered are listed, but only abnormal results are displayed) Labs Reviewed  COMPREHENSIVE METABOLIC PANEL - Abnormal; Notable for the following components:      Result Value   Sodium 133 (*)    Glucose, Bld 106 (*)    Calcium 8.0 (*)    Albumin 3.3 (*)    All other components within normal limits  CBC WITH DIFFERENTIAL/PLATELET - Abnormal; Notable for the following components:   RBC 3.62 (*)    Hemoglobin 11.9 (*)    HCT 36.4 (*)    MCV 100.6 (*)    Platelets 37 (*)    Lymphs Abs 0.4 (*)    All other components within normal limits  APTT - Abnormal; Notable for the following components:   aPTT 38 (*)    All other components within normal limits  URINALYSIS, ROUTINE W REFLEX MICROSCOPIC - Abnormal; Notable for the following components:   Hgb urine dipstick SMALL (*)    Ketones, ur 5 (*)    Protein, ur 30 (*)    All other components within normal limits  PHOSPHORUS - Abnormal; Notable for the following components:   Phosphorus 1.9 (*)    All other components within normal limits  VITAMIN B12 - Abnormal; Notable for the following components:   Vitamin B-12 142 (*)    All other components within normal limits  CULTURE, BLOOD (ROUTINE X 2)  CULTURE, BLOOD (ROUTINE X 2)  URINE CULTURE  SARS CORONAVIRUS 2 (TAT 6-24 HRS)  LACTIC ACID, PLASMA  PROTIME-INR  MAGNESIUM  ETHANOL  AMMONIA  TSH  FOLATE RBC  BASIC METABOLIC PANEL  CBC  CBG MONITORING, ED  TROPONIN I (HIGH SENSITIVITY)    EKG EKG Interpretation  Date/Time:  Thursday September 25 2019 08:25:46 EST Ventricular Rate:  93 PR Interval:    QRS Duration: 91 QT Interval:  408 QTC Calculation: 508 R Axis:   78 Text Interpretation: Sinus rhythm Ventricular premature complex LAE, consider biatrial enlargement Prolonged QT interval Confirmed by Davonna Belling  [phone]) on 09/25/2019 9:52:34 AM   Radiology Ct Head Wo Contrast  Result Date: 09/25/2019 CLINICAL DATA:  83 year old male with altered mental status. EXAM: CT HEAD WITHOUT CONTRAST TECHNIQUE: Contiguous axial images were obtained from the base of the skull through the vertex without intravenous contrast. COMPARISON:  Head CT dated 08/21/2015. FINDINGS: Brain: Mild to moderate age-related atrophy and chronic microvascular ischemic changes. There is no acute intracranial hemorrhage. No mass effect or midline shift. No extra-axial fluid collection. Vascular: No hyperdense vessel or unexpected calcification. Skull: Normal. Negative for fracture or focal lesion. Sinuses/Orbits: The visualized paranasal sinuses are clear. Minimal left mastoid effusions. The right mastoid air cells are clear. No air-fluid level. Other: Focal area of defect in the right maxilla at the lateral incisor tooth, chronic. IMPRESSION: 1. No acute intracranial hemorrhage. 2. Age-related atrophy and chronic microvascular ischemic changes. Electronically Signed   By: Anner Crete M.D.   On: 09/25/2019 11:58   Dg Chest Port 1 View  Result Date: 09/25/2019 CLINICAL DATA:  Altered mental status. EXAM: PORTABLE CHEST 1 VIEW COMPARISON:  08/21/2019 FINDINGS: The heart size and mediastinal contours are within normal limits. Both lungs are clear. The visualized skeletal structures are unremarkable. IMPRESSION: No active disease. Electronically Signed   By: Kerby Moors M.D.   On: 09/25/2019 10:04    Procedures Procedures (including critical care time)  Medications Ordered in ED Medications  vancomycin (VANCOCIN) IVPB 750 mg/150 ml premix (has no administration in time range)  0.9 %  sodium chloride infusion ( Intravenous New Bag/Given 09/25/19 1454)  acetaminophen (TYLENOL) tablet 650 mg (has no administration in time range)    Or  acetaminophen (TYLENOL) suppository 650 mg (has no administration in time range)  ondansetron  (ZOFRAN) tablet 4 mg (has no administration in time range)    Or  ondansetron (ZOFRAN) injection 4 mg (has no administration in time range)  labetalol (NORMODYNE) injection 10 mg (has no administration in time range)  cefTRIAXone (ROCEPHIN) 1 g in sodium chloride 0.9 % 100 mL IVPB (has no administration in time range)  haloperidol lactate (HALDOL) injection 2 mg (2 mg Intravenous Given 09/25/19 1050)  cefTRIAXone (ROCEPHIN) 1 g in sodium chloride 0.9 % 100 mL IVPB (0 g Intravenous Stopped 09/25/19 1243)  vancomycin (VANCOCIN) IVPB 1000 mg/200 mL premix (0 mg Intravenous Stopped 09/25/19 1359)  lactated ringers bolus 2,000 mL (2,000 mLs Intravenous New Bag/Given 09/25/19 1454)     Initial Impression / Assessment and Plan / ED Course  I have reviewed the triage vital signs and the nursing notes.  Pertinent labs & imaging results that were available during my care of the patient were reviewed by me and considered in my medical decision making (see chart for details).        Patient is an 83 year old male with history of physical exam as above presents emergency department for evaluation of altered mental status.  Altered mental status is been present  for approximately 24 hours.  Notably patient was recently hospitalized approximately 1 month ago for sepsis and was bacteremic at that time.  In the emergency department he is febrile to 102.2, mildly tachycardic at 106, mildly tachypneic at 24 breaths/min.  Oxygen saturation within normal limits.  Sepsis evaluation was begun immediately the time of patient's arrival including metabolic panel, CBC, blood cultures, lactic acid.  Lactic acid within normal limits.  Patient was initiated on broad-spectrum antibiotics in the emergency department due to concern for possible bacteremia as the cause of his symptoms and altered mental status.  No evidence of meningeal signs.  Patient be admitted to hospitalist service for further work-up and management. Final  Clinical Impressions(s) / ED Diagnoses   Final diagnoses:  Delirium  Sepsis, due to unspecified organism, unspecified whether acute organ dysfunction present [facility])    ED Discharge Orders    None       Romona Curls, MD 09/25/19 Erskin Burnet    Davonna Belling, MD 09/26/19 [phone]

## 2019-09-25 NOTE — Progress Notes (Signed)
Pharmacy Antibiotic Note  Corey Oneill is a 83 y.o. male admitted on 09/25/2019 with sepsis.  Pharmacy has been consulted for vancomycin dosing. Pt is febrile with Tmax 102.2 and WBC is WNL. SCr is WNL at 0.8 but slightly higher than baseline. Lactic acid is normal.   Plan: Vancomycin 1gm IV x 1 then 750mg  IV Q12H F/u renal fxn, C&S, clinical status and peak/trough at [facility] F/u continuation of gram negative coverage  Height: 5\' 10"  (177.8 cm) Weight: 146 lb (66.2 kg) IBW/kg (Calculated) : 73  Temp (24hrs), Avg:100.7 F (38.2 C), Min:99.1 F (37.3 C), Max:102.2 F (39 C)  Recent Labs  Lab 09/25/19 1000  WBC 4.3  CREATININE 0.80  LATICACIDVEN 1.3    Estimated Creatinine Clearance: 67.8 mL/min (by C-G formula based on SCr of 0.8 mg/dL).    No Known Allergies  Antimicrobials this admission: Vanc 11/12>> CTX x 1 11/12  Dose adjustments this admission: N/A  Microbiology results: Pending  Thank you for allowing pharmacy to be a part of this patient's care.  Rashea Hoskie, Rande Lawman 09/25/2019 12:04 PM

## 2019-09-25 NOTE — H&P (Signed)
History and Physical    Corey Oneill [id number] DOB: 15-Jan-1936 DOA: 09/25/2019  PCP: [name], MD  Patient coming from: Home I have personally briefly reviewed patient's old medical records in [place]  Chief Complaint: Altered mental status  HPI: Corey Oneill is a 83 y.o. 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  Patient's wife is the historian who reported that since yesterday morning patient is not acting like himself.  He was tired and lethargic was not eating or drinking anything.  This morning she noticed that patient is more confused more than his normal baseline, she checked his temperature which was 102, Therefore she called 911 and brought patient to the emergency department for further evaluation and management.  Patient has baseline dementia however he is independent, manages his own medications, sometimes uses cane for ambulation.    Patient recently hospitalized from 08/21/2019 to 08/27/2019 due to sepsis secondary to UTI.  Patient discharged home in a stable condition on p.o. Keflex.  As per patient's wife patient finished his antibiotics as prescribed last week.  He was doing well after the discharge.  No history of upper respiratory symptoms such as cough, congestion, wheezing, shortness of breath, chest pain, headache, blurry vision, leg swelling, N/V/D, abdominal pain.  Wife reports that patient patient was placed on methotrexate and folic acid for the concern of blisters on his hands recently by dermatologist at [facility]  Has no neck urinary incontinence after Foley  catheter removal.  No history of pressure ulcer.  No history of smoking, illicit drug use however he drinks whiskey and wine every day.  ED course: Upon arrival patient was found to have fever of 102.2, tachycardic, tachypneic, blood pressure elevated.  UA was negative.  Urine culture and blood culture was obtained.  CBC shows macrocytosis and thrombocytopenia.  Lactic acid: WNL.  CT head without contrast came back negative.  Chest x-ray negative for pneumonia.  Patient received IV fluids, Rocephin and vancomycin in ED.  EDP also consulted ID for further evaluation and management.  Review of Systems: As per HPI otherwise negative.    Past Medical History:  Diagnosis Date   Abdominal aortic atherosclerosis (HCC)    Anginal pain ([place])    last noted 08/17/19   Bone cancer ([place])    Left hip   BPPV (benign paroxysmal positional vertigo)    Bullous pemphigoid    CAD (coronary artery disease)    a.  LHC 8/16: Mid to distal LAD 30%, OM1 40%, proximal mid RCA 40%, distal RCA 60% >> FFR 0.69  >> PCI: 3 x 15 mm Resolute DES   Depression    Esophageal reflux    Family history of adverse reaction to anesthesia    "daughter has PONV"   Grade I diastolic dysfunction 123456   Noted on ECHO    Grade I diastolic dysfunction [id number]   Noted on ECHO   H/O blood clots    "get them in my stool and urine" (11/12/2015)   Heart murmur    Hepatic lesion 12/08/2015   Stable 8 mm right hepatic lesion   History of aortic stenosis    a. peak to peak gradient by LHC 8/16:  37  mmHg (moderately severe)    History of appendicitis 2016   History of blood transfusion 12/2018   History of herpes labialis    History of ITP    2018, thrombocytopenia   History of kidney stones    Hx of radiation therapy 04/14/13-06/09/13   prostate 7800 cGy, 40 sessions, seminal vesicles 5600 cGy 40 sessions   Hydrocele 2006   Small   Hyperlipidemia    Insomnia    resolved   LVH (left ventricular  hypertrophy)    Moderate, noted on ECHO 09/2018   Malignant melanoma in situ ([place]) 09/04/2016   Right neck and chest   Melanoma ([place])    pt denies   Metastasis from malignant neoplasm of prostate ([place]) 02/25/2019   Prostate cancer ([place]) dx'd 2014   Radiation proctitis    Renal mass 2017   Bilateral renal masses   S/P skin biopsy 04/09/2019   subepidermal cell poor vesicle , Linear IGG and C3 the basement membrane   Suprapubic catheter ([place])    Wears hearing aid in both ears     Past Surgical History:  Procedure Laterality Date   CARDIAC CATHETERIZATION N/A 04/21/2015   Procedure: Right/Left Heart Cath and Coronary Angiography;  Surgeon: [name], MD;  Location: [place] [facility];  Service: Cardiovascular;  Laterality: N/A;   CARDIAC CATHETERIZATION N/A 06/18/2015   Procedure: Intravascular Pressure Wire/FFR Study;  Surgeon: [name], MD;  Location: [place] [facility];  Service: Cardiovascular;  Laterality: N/A;   CARDIAC CATHETERIZATION N/A 06/18/2015   Procedure: Coronary Stent Intervention;  Surgeon: [name], MD;  Location: [place] [facility];  Service: Cardiovascular;  Laterality: N/A;   CARDIAC CATHETERIZATION N/A 06/18/2015   Procedure: Right/Left Heart Cath and Coronary Angiography;  Surgeon: [name], MD;  Location: [place] [facility];  Service: Cardiovascular;  Laterality: N/A;   CARDIAC CATHETERIZATION N/A 06/23/2015   Procedure: Left Heart Cath and Cors/Grafts Angiography;  Surgeon: [name], MD;  Location: [place] [facility];  Service: Cardiovascular;  Laterality: N/A;   CARDIAC CATHETERIZATION N/A 01/17/2016   Procedure: Right/Left Heart Cath and Coronary Angiography;  Surgeon: [name], MD;  Location: [place] [facility];  Service: Cardiovascular;  Laterality: N/A;   CATARACT EXTRACTION, BILATERAL     COLONOSCOPY  06/26/2017   CYSTOSCOPY WITH BIOPSY N/A 07/30/2018   Procedure: CYSTOSCOPY WITH BIOPSY/FULGURATION, CYSTOLTHOLAPAXY;   Surgeon: [name], MD;  Location: [place];  Service: Urology;  Laterality: N/A;   CYSTOSCOPY WITH URETHRAL DILATATION N/A 08/18/2019   Procedure: CYSTOSCOPY WITH URETHRAL DILATATION USING BALLOON OR LASER/ SPURO PUBIC TUBE CHANGE LASER EXCISION OF URETHRAL STRICTURE RETROGRADE URETHROGRAM ANTEGRADE CYSTOGRAM;  Surgeon: [name], MD;  Location: [facility];  Service: Urology;  Laterality: N/A;   FRACTURE SURGERY     INGUINAL HERNIA REPAIR     patient does not remember this procedure   LAPAROSCOPIC APPENDECTOMY N/A 11/16/2015   Procedure: APPENDECTOMY LAPAROSCOPIC;  Surgeon: [name], MD;  Location: [place];  Service: General;  Laterality: N/A;   LEFT HEART CATH AND CORONARY ANGIOGRAPHY N/A 12/26/2016   Procedure: Left Heart Cath and Coronary Angiography;  Surgeon: Troy Sine, MD;  Location: [place] [facility];  Service: Cardiovascular;  Laterality: N/A;   MELANOMA EXCISION Right 10/24/2016   Procedure: WIDE EXCISION MELANOMA RIGHT NECK AND RIGHT CHEST TIMES 2;  Surgeon: [name], MD;  Location: [place];  Service: General;  Laterality: Right;  right medial and  lateral lesion of chest and right neck   NASAL FRACTURE SURGERY     "broken years ago; several ORs to correct it"   [place] BIOPSY  2014   "needle biopsy"   TEE WITHOUT CARDIOVERSION N/A 02/15/2016   Procedure: TRANSESOPHAGEAL ECHOCARDIOGRAM (TEE);  Surgeon: [name], MD;  Location: [place];  Service: Open Heart Surgery;  Laterality: N/A;   TEE WITHOUT CARDIOVERSION N/A 08/26/2019   Procedure: TRANSESOPHAGEAL ECHOCARDIOGRAM (TEE);  Surgeon: Pixie Casino, MD;  Location: [place];  Service: Cardiovascular;  Laterality: N/A;   TONSILLECTOMY     TRANSCATHETER AORTIC VALVE REPLACEMENT, TRANSFEMORAL  02/15/2016   TRANSCATHETER AORTIC VALVE REPLACEMENT, TRANSFEMORAL N/A 02/15/2016   Procedure: TRANSCATHETER AORTIC VALVE REPLACEMENT,  TRANSFEMORAL;  Surgeon: [name], MD;  Location: [place];  Service: Open Heart Surgery;  Laterality: N/A;   TRANSURETHRAL RESECTION OF PROSTATE  12/23/2018   Procedure: CYSTOSCOPY WITH  BLADDER BIOPSY WITH FULGERATION/ TRANSURETHRAL RESECTION PROSTATE  AND PROSTATE BIOPSY;  Surgeon: [name], MD;  Location: [facility];  Service: Urology;;     reports that he has quit smoking. His smoking use included cigarettes. He smoked 0.00 packs per day. He has never used smokeless tobacco. He reports current alcohol use. He reports that he does not use drugs.  No Known Allergies  Family History  Problem Relation Age of Onset   Brain cancer Father    Lymphoma Mother    Depression Daughter     Prior to Admission medications   Medication Sig Start Date End Date Taking? Authorizing Provider  acetaminophen (TYLENOL) 500 MG tablet Take 500 mg by mouth every 6 (six) hours as needed for moderate pain.     [provider]  aspirin EC 81 MG tablet Take 81 mg by mouth at bedtime.     [provider]  carvedilol (COREG) 3.125 MG tablet TAKE 1 TABLET BY MOUTH TWICE DAILY. Patient taking differently: Take 3.125 mg by mouth 2 (two) times daily.  06/26/19   [name], MD  cyanocobalamin 2000 MCG tablet Take 2,000 mcg by mouth daily.    [provider]  DULoxetine (CYMBALTA) 30 MG capsule Take 30 mg by mouth daily.    [provider]  enzalutamide Gillermina Phy) 40 MG capsule Take 160 mg by mouth daily.    [provider]  ferrous sulfate 325 (65 FE) MG EC tablet Take 1 tablet (325 mg total) by mouth daily with breakfast. If no side effects, change to twice daily after 1 week. Patient not taking: Reported on 09/23/2019 12/24/18   Bonnielee Haff, MD  folic acid (FOLVITE) 1 MG tablet Take 1 mg by mouth See admin instructions. Everyday but Tues    [provider]  isosorbide mononitrate (IMDUR) 30 MG 24 hr tablet Take 1 tablet (30 mg total) by mouth daily.  10/22/18 10/22/19  Richardson Dopp T, PA-C  methotrexate (RHEUMATREX) 2.5 MG tablet Take 10 mg by mouth once a week. Tuesday    [provider]  methotrexate 2.5 MG tablet Take 2.5 mg by mouth daily. 08/11/19   [provider]  MULTIPLE VITAMIN PO Take 1 tablet by mouth daily.    [provider]  nitroGLYCERIN (NITROSTAT) 0.4 MG SL tablet 1 TAB UNDER TONGUE AS NEEDED FOR CHEST PAIN. MAY REPEAT EVERY 5 MIN FOR A TOTAL OF 3 DOSES. Patient taking differently: Place 0.4 mg under the tongue every 5 (five) minutes as needed for chest pain.  07/07/19  [name], MD  rosuvastatin (CRESTOR) 10 MG tablet TAKE 1 TABLET ONCE DAILY. Patient taking differently: Take 10 mg by mouth daily.  05/26/19   Tysinger, Camelia Eng, PA-C  terbinafine (LAMISIL) 250 MG tablet Take 250 mg by mouth daily.    [provider]  traMADol (ULTRAM) 50 MG tablet Take by mouth every 6 (six) hours as needed.    [provider]    Physical Exam: Vitals:   09/25/19 1045 09/25/19 1100 09/25/19 1115 09/25/19 1124  BP: (!) 145/84 (!) 172/81 (!) 167/85   Pulse: 87 93 (!) 106   Resp:  20 20   Temp:      TempSrc:      SpO2: 98% 98% 99%   Weight:    66.2 kg  Height:    5\' 10"  (1.778 m)    Constitutional: Sleepy, opens his eyes only with painful stimuli. Eyes: PERRL, lids and conjunctivae normal ENMT: Mucous membranes are dry. Posterior pharynx clear of any exudate or lesions.Normal dentition.  Neck: normal, supple, no masses, no thyromegaly Respiratory: clear to auscultation bilaterally, no wheezing, no crackles. Normal respiratory effort. No accessory muscle use.  Cardiovascular: Regular rate and rhythm, no murmurs / rubs / gallops. No extremity edema. 2+ pedal pulses. No carotid bruits.  Abdomen: no tenderness, no masses palpated. No hepatosplenomegaly. Bowel sounds positive.  Musculoskeletal: no clubbing / cyanosis. No joint deformity upper and lower extremities. Good ROM, no  contractures. Normal muscle tone.  Skin: Blisters noted on hands and feet.   Neurologic: Patient sleepy, arousable only with painful stimuli.  Unable to perform neurological exam due to patient's altered mental status.   Labs on Admission: I have personally reviewed following labs and imaging studies  CBC: Recent Labs  Lab 09/25/19 1000  WBC 4.3  NEUTROABS 3.6  HGB 11.9*  HCT 36.4*  MCV 100.6*  PLT 37*   Basic Metabolic Panel: Recent Labs  Lab 09/25/19 1000  NA 133*  K 3.8  CL 99  CO2 23  GLUCOSE 106*  BUN 10  CREATININE 0.80  CALCIUM 8.0*   GFR: Estimated Creatinine Clearance: 67.8 mL/min (by C-G formula based on SCr of 0.8 mg/dL). Liver Function Tests: Recent Labs  Lab 09/25/19 1000  AST 17  ALT 9  ALKPHOS 70  BILITOT 0.8  PROT 6.8  ALBUMIN 3.3*   No results for input(s): LIPASE, AMYLASE in the last 168 hours. No results for input(s): AMMONIA in the last 168 hours. Coagulation Profile: Recent Labs  Lab 09/25/19 1000  INR 1.2   Cardiac Enzymes: No results for input(s): CKTOTAL, CKMB, CKMBINDEX, TROPONINI in the last 168 hours. BNP (last 3 results) No results for input(s): PROBNP in the last 8760 hours. HbA1C: No results for input(s): HGBA1C in the last 72 hours. CBG: Recent Labs  Lab 09/25/19 0826  GLUCAP 87   Lipid Profile: No results for input(s): CHOL, HDL, LDLCALC, TRIG, CHOLHDL, LDLDIRECT in the last 72 hours. Thyroid Function Tests: No results for input(s): TSH, T4TOTAL, FREET4, T3FREE, THYROIDAB in the last 72 hours. Anemia Panel: No results for input(s): VITAMINB12, FOLATE, FERRITIN, TIBC, IRON, RETICCTPCT in the last 72 hours. Urine analysis:    Component Value Date/Time   COLORURINE YELLOW 09/25/2019 [place] 09/25/2019 1135   LABSPEC 1.016 09/25/2019 1135   PHURINE 7.0 09/25/2019 1135   GLUCOSEU NEGATIVE 09/25/2019 1135   [place] (A) 09/25/2019 [place] 09/25/2019 1135   BILIRUBINUR n  10/15/2015 [id number]  KETONESUR 5 (A) 09/25/2019 1135   PROTEINUR 30 (A) 09/25/2019 1135   UROBILINOGEN 0.2 10/15/2015 0939   UROBILINOGEN 0.2 06/23/2015 1443   NITRITE NEGATIVE 09/25/2019 1135   LEUKOCYTESUR NEGATIVE 09/25/2019 1135    Radiological Exams on Admission: Ct Head Wo Contrast  Result Date: 09/25/2019 CLINICAL DATA:  83 year old male with altered mental status. EXAM: CT HEAD WITHOUT CONTRAST TECHNIQUE: Contiguous axial images were obtained from the base of the skull through the vertex without intravenous contrast. COMPARISON:  Head CT dated 08/21/2015. FINDINGS: Brain: Mild to moderate age-related atrophy and chronic microvascular ischemic changes. There is no acute intracranial hemorrhage. No mass effect or midline shift. No extra-axial fluid collection. Vascular: No hyperdense vessel or unexpected calcification. Skull: Normal. Negative for fracture or focal lesion. Sinuses/Orbits: The visualized paranasal sinuses are clear. Minimal left mastoid effusions. The right mastoid air cells are clear. No air-fluid level. Other: Focal area of defect in the right maxilla at the lateral incisor tooth, chronic. IMPRESSION: 1. No acute intracranial hemorrhage. 2. Age-related atrophy and chronic microvascular ischemic changes. Electronically Signed   By: Anner Crete M.D.   On: 09/25/2019 11:58   Dg Chest Port 1 View  Result Date: 09/25/2019 CLINICAL DATA:  Altered mental status. EXAM: PORTABLE CHEST 1 VIEW COMPARISON:  08/21/2019 FINDINGS: The heart size and mediastinal contours are within normal limits. Both lungs are clear. The visualized skeletal structures are unremarkable. IMPRESSION: No active disease. Electronically Signed   By: Kerby Moors M.D.   On: 09/25/2019 10:04    EKG: Normal sinus rhythm, premature ventricular complexes, no ST elevation or depression noted.  Assessment/Plan Principal Problem:   Altered mental state Active Problems:   Hyperlipidemia   Prostate cancer  (HCC)   Depression   S/P TAVR (transcatheter aortic valve replacement)   Hx of radiation therapy   Bullous pemphigoid   Idiopathic thrombocytopenic purpura (ITP) (HCC)   Macrocytic anemia   SIRS (systemic inflammatory response syndrome) (HCC)    Altered mental status: -Unknown etiology?  Could be secondary to underlying sepsis?-Source is unknown.  Patient presented with tachycardia, tachypnea, fever of 102.2.  Lactic acid: WNL.  UA negative.  Chest x-ray negative. -CT head without contrast came back negative for acute findings. -Patient received IV fluids, Rocephin and vancomycin in ED. -Urine culture and blood culture is obtained and is pending. -Will admit patient at stepdown unit for close monitoring.  On telemetry. -EDP consulted ID for further management-await recommendation -We will keep him n.p.o. as patient is confused. -We will continue IV fluids and IV antibiotics. -Check ethanol level, ammonia level, TSH, 123456 and folic acid level -Neurochecks. -Consulted PT/ST/OT  ITP: -Patient's platelet count: 37 -No signs of active bleeding.  Monitor signs for bleeding -Monitor platelet count closely.  History of prostate cancer with metastasis to bone: -Status post radiation therapy.  Macrocytic anemia: -Likely secondary from methotrexate? -We will TSH, check 123456 and folic acid level -H&H is stable.  History of bullous pemphigoid: -Followed by dermatology outpatient -Recently started on methotrexate and folic acid-we will hold for now.  History of severe aortic stenosis status post TAVR: Aware -On telemetry.  Hypertension: -Blood pressure elevated upon arrival.  Will hold p.o. home meds -Start on labetalol as needed.  Alcohol abuse: Check ethanol level -On CIWA protocol. -We will monitor for signs for withdrawal.  DVT prophylaxis: TED/SCD, no Lovenox due to thrombocytopenia Code Status: DNR-confirmed with wife Family Communication: Patient's wife present at bedside.   Plan of care discussed with patient's wife  in length and she verbalized understanding and agreed with it. Disposition Plan: TBD Consults called: ID by EDP Admission status: Inpatient  Mckinley Jewel MD [facility] Pager [phone]  If 7PM-7AM, please contact night-coverage [email or web address] Password [email or web address]  09/25/2019, 1:34 PM
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
## 2019-09-25 NOTE — ED Notes (Signed)
Patient transported to CT 

## 2019-09-26 ENCOUNTER — Inpatient Hospital Stay (HOSPITAL_COMMUNITY): Payer: Medicare Other

## 2019-09-26 DIAGNOSIS — I371 Nonrheumatic pulmonary valve insufficiency: Secondary | ICD-10-CM

## 2019-09-26 DIAGNOSIS — D693 Immune thrombocytopenic purpura: Secondary | ICD-10-CM

## 2019-09-26 DIAGNOSIS — Z888 Allergy status to other drugs, medicaments and biological substances status: Secondary | ICD-10-CM

## 2019-09-26 DIAGNOSIS — Z952 Presence of prosthetic heart valve: Secondary | ICD-10-CM

## 2019-09-26 DIAGNOSIS — C61 Malignant neoplasm of prostate: Secondary | ICD-10-CM

## 2019-09-26 DIAGNOSIS — R7881 Bacteremia: Secondary | ICD-10-CM

## 2019-09-26 DIAGNOSIS — Z87891 Personal history of nicotine dependence: Secondary | ICD-10-CM

## 2019-09-26 DIAGNOSIS — Z79899 Other long term (current) drug therapy: Secondary | ICD-10-CM

## 2019-09-26 DIAGNOSIS — F1010 Alcohol abuse, uncomplicated: Secondary | ICD-10-CM

## 2019-09-26 DIAGNOSIS — B952 Enterococcus as the cause of diseases classified elsewhere: Secondary | ICD-10-CM

## 2019-09-26 DIAGNOSIS — I5032 Chronic diastolic (congestive) heart failure: Secondary | ICD-10-CM

## 2019-09-26 DIAGNOSIS — R4182 Altered mental status, unspecified: Secondary | ICD-10-CM

## 2019-09-26 DIAGNOSIS — L120 Bullous pemphigoid: Secondary | ICD-10-CM

## 2019-09-26 DIAGNOSIS — I350 Nonrheumatic aortic (valve) stenosis: Secondary | ICD-10-CM

## 2019-09-26 DIAGNOSIS — C7951 Secondary malignant neoplasm of bone: Secondary | ICD-10-CM

## 2019-09-26 DIAGNOSIS — Z8619 Personal history of other infectious and parasitic diseases: Secondary | ICD-10-CM

## 2019-09-26 LAB — BASIC METABOLIC PANEL
Anion gap: 12 (ref 5–15)
BUN: 8 mg/dL (ref 8–23)
CO2: 19 mmol/L — ABNORMAL LOW (ref 22–32)
Calcium: 7.6 mg/dL — ABNORMAL LOW (ref 8.9–10.3)
Chloride: 99 mmol/L (ref 98–111)
Creatinine, Ser: 0.67 mg/dL (ref 0.61–1.24)
GFR calc Af Amer: >60 mL/min (ref >60)
GFR calc non Af Amer: >60 mL/min (ref >60)
Glucose, Bld: 109 mg/dL — ABNORMAL HIGH (ref 70–99)
Potassium: 3.5 mmol/L (ref 3.5–5.1)
Sodium: 130 mmol/L — ABNORMAL LOW (ref 135–145)

## 2019-09-26 LAB — BLOOD CULTURE ID PANEL (REFLEXED)

## 2019-09-26 LAB — ECHOCARDIOGRAM LIMITED
Height: 70 in
Weight: 2232.82 oz

## 2019-09-26 LAB — CBC
HCT: 36.4 % — ABNORMAL LOW (ref 39.0–52.0)
Hemoglobin: 12.7 g/dL — ABNORMAL LOW (ref 13.0–17.0)
MCH: 33.8 pg (ref 26.0–34.0)
MCHC: 34.9 g/dL (ref 30.0–36.0)
MCV: 96.8 fL (ref 80.0–100.0)
Platelets: 31 10*3/uL — ABNORMAL LOW (ref 150–400)
RBC: 3.76 MIL/uL — ABNORMAL LOW (ref 4.22–5.81)
RDW: 13.8 % (ref 11.5–15.5)
WBC: 6.6 10*3/uL (ref 4.0–10.5)
nRBC: 0 % (ref 0.0–0.2)

## 2019-09-26 LAB — FOLATE RBC
Folate, Hemolysate: 372 ng/mL
Folate, RBC: 1107 ng/mL (ref >498)
Hematocrit: 33.6 % — ABNORMAL LOW (ref 37.5–51.0)

## 2019-09-26 MED ORDER — SODIUM CHLORIDE 0.9 % IV SOLN
2.0000 g | Freq: Two times a day (BID) | INTRAVENOUS | Status: DC
  Administered 2019-09-26 – 2019-09-30 (×9): 2 g via INTRAVENOUS
  Filled 2019-09-26 (×4): qty 2
  Filled 2019-09-26 (×3): qty 20
  Filled 2019-09-26 (×2): qty 2
  Filled 2019-09-26: qty 20

## 2019-09-26 MED ORDER — SODIUM CHLORIDE 0.9 % IV SOLN
2.0000 g | INTRAVENOUS | Status: DC
  Administered 2019-09-26 – 2019-09-30 (×25): 2 g via INTRAVENOUS
  Filled 2019-09-26: qty 2
  Filled 2019-09-26: qty 2000
  Filled 2019-09-26 (×4): qty 2
  Filled 2019-09-26 (×3): qty 2000
  Filled 2019-09-26: qty 2
  Filled 2019-09-26 (×4): qty 2000
  Filled 2019-09-26: qty 2
  Filled 2019-09-26 (×3): qty 2000
  Filled 2019-09-26: qty 2
  Filled 2019-09-26: qty 2000
  Filled 2019-09-26 (×3): qty 2
  Filled 2019-09-26 (×5): qty 2000
  Filled 2019-09-26 (×2): qty 2
  Filled 2019-09-26: qty 2000

## 2019-09-26 MED ORDER — VITAMIN B-12 100 MCG PO TABS
500.0000 ug | ORAL_TABLET | Freq: Every day | ORAL | Status: DC
  Administered 2019-09-26 – 2019-09-30 (×5): 500 ug via ORAL
  Filled 2019-09-26 (×6): qty 5

## 2019-09-26 NOTE — Evaluation (Signed)
Clinical/Bedside Swallow Evaluation Patient Details  Name: Corey Oneill MRN: [id number] Date of Birth: [birth year]  Today's Date: 09/26/2019 Time: SLP Start Time (ACUTE ONLY): 0945 SLP Stop Time (ACUTE ONLY): 1000 SLP Time Calculation (min) (ACUTE ONLY): 15 min  Past Medical History:  Past Medical History:  Diagnosis Date  . Abdominal aortic atherosclerosis ([place])   . Anginal pain ([place])    last noted 08/17/19  . Bone cancer ([place])    Left hip  . BPPV (benign paroxysmal positional vertigo)   . Bullous pemphigoid   . CAD (coronary artery disease)    a.  LHC 8/16: Mid to distal LAD 30%, OM1 40%, proximal mid RCA 40%, distal RCA 60% >> FFR 0.69  >> PCI: 3 x 15 mm Resolute DES  . Depression   . Esophageal reflux   . Family history of adverse reaction to anesthesia    "daughter has PONV"  . Grade I diastolic dysfunction [id number]   Noted on ECHO   . Grade I diastolic dysfunction [id number]   Noted on ECHO  . H/O blood clots    "get them in my stool and urine" (11/12/2015)  . Heart murmur   . Hepatic lesion 12/08/2015   Stable 8 mm right hepatic lesion  . History of aortic stenosis    a. peak to peak gradient by LHC 8/16:  37 mmHg (moderately severe)   . History of appendicitis 2016  . History of blood transfusion 12/2018  . History of herpes labialis   . History of ITP    2018, thrombocytopenia  . History of kidney stones   . Hx of radiation therapy 04/14/13-06/09/13   prostate 7800 cGy, 40 sessions, seminal vesicles 5600 cGy 40 sessions  . Hydrocele 2006   Small  . Hyperlipidemia   . Insomnia    resolved  . LVH (left ventricular hypertrophy)    Moderate, noted on ECHO 09/2018  . Malignant melanoma in situ ([place]) 09/04/2016   Right neck and chest  . Melanoma ([place])    pt denies  . Metastasis from malignant neoplasm of prostate ([place]) 02/25/2019  . Prostate cancer ([place]) dx'd 2014  . Radiation proctitis   . Renal mass 2017   Bilateral renal masses  . S/P skin biopsy 04/09/2019    subepidermal cell poor vesicle , Linear IGG and C3 the basement membrane  . Suprapubic catheter ([place])   . Wears hearing aid in both ears    Past Surgical History:  Past Surgical History:  Procedure Laterality Date  . CARDIAC CATHETERIZATION N/A 04/21/2015   Procedure: Right/Left Heart Cath and Coronary Angiography;  Surgeon: [name], MD;  Location: [facility];  Service: Cardiovascular;  Laterality: N/A;  . CARDIAC CATHETERIZATION N/A 06/18/2015   Procedure: Intravascular Pressure Wire/FFR Study;  Surgeon: [name], MD;  Location: [facility];  Service: Cardiovascular;  Laterality: N/A;  . CARDIAC CATHETERIZATION N/A 06/18/2015   Procedure: Coronary Stent Intervention;  Surgeon: [name], MD;  Location: [facility];  Service: Cardiovascular;  Laterality: N/A;  . CARDIAC CATHETERIZATION N/A 06/18/2015   Procedure: Right/Left Heart Cath and Coronary Angiography;  Surgeon: [name], MD;  Location: [facility];  Service: Cardiovascular;  Laterality: N/A;  . CARDIAC CATHETERIZATION N/A 06/23/2015   Procedure: Left Heart Cath and Cors/Grafts Angiography;  Surgeon: [name], MD;  Location: [facility];  Service: Cardiovascular;  Laterality: N/A;  . CARDIAC CATHETERIZATION N/A 01/17/2016   Procedure: Right/Left  Heart Cath and Coronary Angiography;  Surgeon: [name], MD;  Location: [facility];  Service: Cardiovascular;  Laterality: N/A;  . CATARACT EXTRACTION, BILATERAL    . COLONOSCOPY  06/26/2017  . CYSTOSCOPY WITH BIOPSY N/A 07/30/2018   Procedure: CYSTOSCOPY WITH BIOPSY/FULGURATION, CYSTOLTHOLAPAXY;  Surgeon: [name], MD;  [facility];  Service: Urology;  Laterality: N/A;  . CYSTOSCOPY WITH URETHRAL DILATATION N/A 08/18/2019   Procedure: CYSTOSCOPY WITH URETHRAL DILATATION USING BALLOON OR LASER/ SPURO PUBIC TUBE CHANGE LASER EXCISION OF URETHRAL STRICTURE RETROGRADE URETHROGRAM ANTEGRADE  CYSTOGRAM;  Surgeon: [name], MD;  Location: [facility];  Service: Urology;  Laterality: N/A;  . FRACTURE SURGERY    . INGUINAL HERNIA REPAIR     patient does not remember this procedure  . LAPAROSCOPIC APPENDECTOMY N/A 11/16/2015   Procedure: APPENDECTOMY LAPAROSCOPIC;  Surgeon: [name], MD;  Location: [facility];  Service: General;  Laterality: N/A;  . LEFT HEART CATH AND CORONARY ANGIOGRAPHY N/A 12/26/2016   Procedure: Left Heart Cath and Coronary Angiography;  Surgeon: Troy Sine, MD;  Location: [facility];  Service: Cardiovascular;  Laterality: N/A;  . MELANOMA EXCISION Right 10/24/2016   Procedure: WIDE EXCISION MELANOMA RIGHT NECK AND RIGHT CHEST TIMES 2;  Surgeon: [name], MD;  Location: [facility];  Service: General;  Laterality: Right;  right medial and lateral lesion of chest and right neck  . NASAL FRACTURE SURGERY     "broken years ago; several ORs to correct it"  . NASAL SEPTUM SURGERY    . PROSTATE BIOPSY  2014   "needle biopsy"  . TEE WITHOUT CARDIOVERSION N/A 02/15/2016   Procedure: TRANSESOPHAGEAL ECHOCARDIOGRAM (TEE);  Surgeon: [name], MD;  Location: [facility];  Service: Open Heart Surgery;  Laterality: N/A;  . TEE WITHOUT CARDIOVERSION N/A 08/26/2019   Procedure: TRANSESOPHAGEAL ECHOCARDIOGRAM (TEE);  Surgeon: Pixie Casino, MD;  Location: [facility];  Service: Cardiovascular;  Laterality: N/A;  . TONSILLECTOMY    . TRANSCATHETER AORTIC VALVE REPLACEMENT, TRANSFEMORAL  02/15/2016  . TRANSCATHETER AORTIC VALVE REPLACEMENT, TRANSFEMORAL N/A 02/15/2016   Procedure: TRANSCATHETER AORTIC VALVE REPLACEMENT, TRANSFEMORAL;  Surgeon: [name], MD;  Location: [facility];  Service: Open Heart Surgery;  Laterality: N/A;  . TRANSURETHRAL RESECTION OF PROSTATE  12/23/2018   Procedure: CYSTOSCOPY WITH  BLADDER BIOPSY WITH FULGERATION/ TRANSURETHRAL RESECTION PROSTATE  AND PROSTATE BIOPSY;  Surgeon: [name], MD;  Location: [facility];   Service: Urology;;   HPI:  83 y.o. 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 CT head negative for acute abnormality   Assessment / Plan / Recommendation Clinical Impression  Pt seated in recliner, alert and oriented x2, confused, but able to follow commands to participate in clinical swallow assessment.  Oral mechanism exam is unremarkable.  Pt accepted sips of water, spoons of teaspoon -  function was notable for multiple sub-swallows (up to five) with a single PO bolus.  There was delayed coughing and wet phonation post-swallow.  Pt unable to provide hx; no record of prior swallow assessments.  Recommend proceeding with MBS today to determine nature of swallowing deficits; keep NPO pending study except for meds to be crushed in puree.  D/W RN.  SLP Visit Diagnosis: Dysphagia, oropharyngeal phase (R13.12)    Aspiration Risk       Diet Recommendation   npo except meds as below  Medication Administration: Crushed with puree    Other  Recommendations Oral Care Recommendations: Oral care QID   Follow up Recommendations        Frequency and Duration     tba       Prognosis   tba     Swallow Study   General Date of Onset: 09/26/19 HPI: 83 y.o. male with medical history significant of severe aortic stenosis status post TAVR, ITP, prostate cancer status post external beam radiation therapy with metastasis to left iliac bone., radiation prostatitis, hyperlipidemia, depression, GERD, coronary artery disease, chronic diastolic congestive heart failure, bullous pemphigus-on methotrexate, macrocytic  anemia, alcohol abuse presented to emergency department with altered mental status. CT head negative for acute abnormality Type of Study: Bedside Swallow Evaluation Previous Swallow Assessment: no Diet Prior to this Study: NPO Temperature Spikes Noted: No Respiratory Status: Room air History of Recent Intubation: No Behavior/Cognition: Alert;Confused Oral Cavity Assessment: Within Functional Limits Oral Care Completed by SLP: Recent completion by staff Oral Cavity - Dentition: Adequate natural dentition Vision: Functional for self-feeding Self-Feeding Abilities: Needs assist Patient Positioning: Upright in chair Baseline Vocal Quality: Normal Volitional Cough: Strong Volitional Swallow: Able to elicit    Oral/Motor/Sensory Function Overall Oral Motor/Sensory Function: Within functional limits   Ice Chips Ice chips: Within functional limits   Thin Liquid Thin Liquid: Impaired Presentation: Cup;Straw Pharyngeal  Phase Impairments: Multiple swallows;Cough - Delayed    Nectar Thick Nectar Thick Liquid: Not tested   Honey Thick Honey Thick Liquid: Not tested   Puree Puree: Impaired Presentation: Spoon Pharyngeal Phase Impairments: Multiple swallows;Cough - Delayed   Solid     Solid: Not tested      Juan Quam Laurice 09/26/2019,10:16 AM  Estill Bamberg L. Tivis Ringer, [place] Office number [phone] Pager [phone]

## 2019-09-26 NOTE — Evaluation (Signed)
Occupational Therapy Evaluation Patient Details Name: Corey Oneill MRN: [id number] DOB: 01/11/1936 Today's Date: 09/26/2019    History of Present Illness Corey Oneill is a 83 y.o. 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 CT head negative for acute abnormality.    Clinical Impression   Pt admitted with above and presents to OT with deficits impacting ability to complete ADLs at [facility].  Pt greatly impacted by decreased initiation and difficulty following one step commands.  Pt answering questions with increased time, stating that he could sit up but required max cues and increased wait time for initiation ultimately requiring mod-max assist for rolling.  Once mobility initiated, pt able to complete rolling and sit > stand with min assist.  Required physical assistance (+2) during stand pivot transfer as pt with decreased initiation and sequencing during transfer to recliner.  Pt will benefit from OT acutely to address initiation and sequencing during self-care tasks to decrease burden of care. Pt will benefit from SNF level post acute rehab to allow for further therapy prior to d/c home with wife.    Follow Up Recommendations  SNF;Supervision/Assistance - 24 hour    Equipment Recommendations  (defer to next venue of care)       Precautions / Restrictions Precautions Precautions: Fall Restrictions Weight Bearing Restrictions: No      Mobility Bed Mobility Overal bed mobility: Needs Assistance Bed Mobility: Rolling;Sidelying to Sit Rolling: Mod assist Sidelying to sit: Mod assist       General  bed mobility comments: Required increased time and max cues for initiation of rolling as pt stating that he could do it himself, however required assist to initiate movement with pt then able to complete wtih less assist  Transfers Overall transfer level: Needs assistance Equipment used: 2 person hand held assist;Rolling walker (2 wheeled) Transfers: Sit to/from [facility] Sit to Stand: Min assist;+2 safety/equipment Stand pivot transfers: Mod assist;+2 physical assistance       General transfer comment: Attempted use of RW, however pt unable to manage RW during mobility, therefore utilized 2 person hand held assist during stand pivot transfer.  Due to decreased initiation and requiring increased time to follow one step directions, required total asist for transfer and sit to recliner        ADL either performed or assessed with clinical judgement   ADL Overall ADL's : Needs assistance/impaired     Grooming: Min guard;Sitting Grooming Details (indicate cue type and reason): min guard for sitting balance, requires increased cues and wait time for initiation of any functional mobility/task             Lower Body Dressing: Maximal assistance;Bed level Lower Body Dressing Details (indicate cue type and reason): required physical assist to lift legs against gravity to don socks Toilet Transfer: +2 for safety/equipment;Cueing for sequencing;Stand-pivot;+2 for physical assistance;Moderate assistance Toilet Transfer Details (indicate cue type and reason): Min assist sit > stand, however required mod assist +2 for stand pivot to recliner as pt with decreased initiation, following one step commands, and difficulty weight shifting to step to complete stand pivot transfer         Functional mobility during ADLs: Moderate assistance;+2 for physical assistance;+2 for safety/equipment  General ADL Comments: Attempted use of RW, however pt unable to manage RW during mobility,  therefore utilized 2 person hand held assist during stand pivot transfer.  Due to decreased initiation and requiring increased time to follow one step directions, required total asist for transfer and sit to recliner     Vision Baseline Vision/History: Wears glasses Wears Glasses: At all times Vision Assessment?: Vision impaired- to be further tested in functional context Additional Comments: Noted Rt gaze preference, pt able to turn head to Lt with increased cues but with difficulty scanning past midline            Pertinent Vitals/Pain Pain Assessment: Faces Faces Pain Scale: Hurts little more Pain Location: back Pain Descriptors / Indicators: Grimacing Pain Intervention(s): Monitored during session;Repositioned     Hand Dominance Right   Extremity/Trunk Assessment Upper Extremity Assessment Upper Extremity Assessment: Generalized weakness           Communication Communication Communication: No difficulties   Cognition Arousal/Alertness: Lethargic Behavior During Therapy: WFL for tasks assessed/performed Overall Cognitive Status: History of cognitive impairments - at baseline                                 General Comments: Pt with baseline dementia.  Decreased initiation and following one step directions              Home Living Family/patient expects to be discharged to:: Private residence Living Arrangements: Spouse/significant other Available Help at Discharge: Family;Available 24 hours/day Type of Home: House Home Access: Level entry     Home Layout: One level     Bathroom Shower/Tub: Occupational psychologist: Handicapped height     Home Equipment: [place] - single point;Shower seat;Walker - 2 wheels;Grab bars - tub/shower          Prior Functioning/Environment Level of Independence: Independent with assistive device(s)        Comments: Patient has baseline dementia however wife report he is independent, manages his own  medications, sometimes uses cane for ambulation        OT Problem List: Decreased strength;Decreased activity tolerance;Impaired balance (sitting and/or standing);Decreased cognition;Decreased safety awareness;Decreased knowledge of use of DME or AE      OT Treatment/Interventions: Self-care/ADL training;Therapeutic exercise;DME and/or AE instruction;Therapeutic activities;Cognitive remediation/compensation;Patient/family education;Balance training    OT Goals(Current goals can be found in the care plan section) Acute Rehab OT Goals Patient Stated Goal: none stated OT Goal Formulation: With patient Time For Goal Achievement: 10/10/19 Potential to Achieve Goals: Good  OT Frequency: Min 2X/week           Co-evaluation PT/OT/SLP Co-Evaluation/Treatment: Yes Reason for Co-Treatment: Complexity of the patient's impairments (multi-system involvement);Necessary to address cognition/behavior during functional activity;For patient/therapist safety;To address functional/ADL transfers   OT goals addressed during session: ADL's and self-care      AM-PAC OT "6 Clicks" Daily Activity     Outcome Measure Help from another person eating meals?: A Lot Help from another person taking care of personal grooming?: A Lot Help from another person toileting, which includes using toliet, bedpan, or urinal?: Total Help from another person bathing (including washing, rinsing, drying)?: A Lot Help from another person to put on and taking off regular upper body clothing?: A Lot Help from another person to put on and taking off regular lower body clothing?: A Lot 6 Click Score: 11   End of Session Equipment Utilized During Treatment: Gait  belt;Rolling walker Nurse Communication: Mobility status  Activity Tolerance: Patient tolerated treatment well Patient left: in chair;with call bell/phone within reach;with chair alarm set  OT Visit Diagnosis: Unsteadiness on feet (R26.81);Other symptoms and signs  involving cognitive function                Time: [id number] OT Time Calculation (min): 37 min Charges:  OT General Charges $OT Visit: 1 Visit OT Evaluation $OT Eval Moderate Complexity: [place], [place] 09/26/2019, 9:52 AM

## 2019-09-26 NOTE — Progress Notes (Signed)
[name]  PROGRESS NOTE    Corey Oneill  [id number] DOB: 10/14/1936 DOA: 09/25/2019 PCP: [name], MD   Brief Narrative:   DIETER HUYCK is a 83 y.o. 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  11/13: Bacteremic. Now on ampicillin, rocephin. Appreciate ID help   Assessment & Plan:   Principal Problem:   Altered mental state Active Problems:   Hyperlipidemia   Prostate cancer (HCC)   Depression   S/P TAVR (transcatheter aortic valve replacement)   Hx of radiation therapy   Bullous pemphigoid   Idiopathic thrombocytopenic purpura (ITP) (HCC)   Macrocytic anemia   SIRS (systemic inflammatory response syndrome) (HCC)  Altered mental status: Sepsis secondary to Enterococcus UTI/Bacteremia     - Patient presented with tachycardia, tachypnea, fever of 102.2.       - Lactic acid: WNL.  UA negative.  Chest x-ray negative.     - CT head without contrast came back negative for acute findings.     - UCx (E faecalis), Bld Cx (GPC), BCID (enterococcus species)     - ID onboard     - TTE ordered     - Plan is for 6 weeks of dual beta lactam, followed by likely lifelong suppressive amox tx  Dysphagia     - SLP: Rec dys 1 diet w/ thin liquids  Debility     - PT/OT rec SNF     - working on options  Chronic ITP:     - No signs of active bleeding. Monitor  History of prostate cancer with metastasis to bone:     - Status post radiation therapy.  Macrocytic anemia:     - Likely secondary from methotrexate?     - B12 is low; replace     - H&H, TSH ok     - check folate  History of  bullous pemphigoid:     - Followed by dermatology outpatient     - Recently started on methotrexate and folic acid; being held.  History of severe aortic stenosis status post TAVR: Aware     - On telemetry.  Hypertension:     - on coreg, imdur at home     - can resume coreg  Alcohol abuse:     - On CIWA protocol.  DVT prophylaxis: SCDs Code Status: DNR   Disposition Plan: TBD  Consultants:   ID  Antimicrobials:   Ampicillin, rocephin   ROS:  Denies CP, N, V, dyspnea . Remainder 10-pt ROS is negative for all not previously mentioned.  Subjective: "I'm fine."  Objective: Vitals:   09/26/19 0005 09/26/19 0436 09/26/19 0500 09/26/19 1000  BP: (!) 146/81 (!) 156/79  122/76  Pulse: 91 94    Resp: (!) 21 18    Temp: (!) 100.7 F (38.2 C) (!) 97.5 F (36.4 C) 98 F (36.7 C) 98 F (36.7 C)  TempSrc: Oral Oral Oral   SpO2: 100% 97%  99%  Weight: 63.3 kg     Height:        Intake/Output Summary (Last 24 hours) at 09/26/2019 1620 Last data filed at 09/26/2019 0436 Gross per 24 hour  Intake 1000 ml  Output 425 ml  Net 575 ml   Filed Weights   09/25/19 1124 09/26/19 0005  Weight: 66.2 kg 63.3 kg    Examination:  General: 83 y.o. male resting in chair in NAD Cardiovascular: RRR, +S1, S2, no m/g/r, equal pulses throughout Respiratory: CTABL, no w/r/r, normal WOB GI: BS+, NDNT, no masses noted, no organomegaly noted MSK: No e/c/c Skin: No rashes, bruises, ulcerations noted Neuro: Alert to name, place, follows commands   Data Reviewed: I have personally reviewed following labs and imaging studies.  CBC: Recent Labs  Lab 09/25/19 1000 09/26/19 0309  WBC 4.3 6.6  NEUTROABS 3.6  --   HGB 11.9* 12.7*  HCT 36.4* 36.4*  MCV 100.6* 96.8  PLT 37* 31*   Basic Metabolic Panel: Recent Labs  Lab 09/25/19 1000 09/25/19 1425 09/26/19 0309  NA 133*  --  130*  K 3.8  --  3.5  CL 99  --  99  CO2 23  --  19*  GLUCOSE 106*  --  109*  BUN 10  --  8    CREATININE 0.80  --  0.67  CALCIUM 8.0*  --  7.6*  MG  --  2.0  --   PHOS  --  1.9*  --    GFR: Estimated Creatinine Clearance: 64.8 mL/min (by C-G formula based on SCr of 0.67 mg/dL). Liver Function Tests: Recent Labs  Lab 09/25/19 1000  AST 17  ALT 9  ALKPHOS 70  BILITOT 0.8  PROT 6.8  ALBUMIN 3.3*   No results for input(s): LIPASE, AMYLASE in the last 168 hours. Recent Labs  Lab 09/25/19 1425  AMMONIA 27   Coagulation Profile: Recent Labs  Lab 09/25/19 1000  INR 1.2   Cardiac Enzymes: No results for input(s): CKTOTAL, CKMB, CKMBINDEX, TROPONINI in the last 168 hours. BNP (last 3 results) No results for input(s): PROBNP in the last 8760 hours. HbA1C: No results for input(s): HGBA1C in the last 72 hours. CBG: Recent Labs  Lab 09/25/19 0826  GLUCAP 87   Lipid Profile: No results for input(s): CHOL, HDL, LDLCALC, TRIG, CHOLHDL, LDLDIRECT in the last 72 hours. Thyroid Function Tests: Recent Labs    09/25/19 1425  TSH 1.620   Anemia Panel: Recent Labs    09/25/19 1425  VITAMINB12 142*   Sepsis Labs: Recent Labs  Lab 09/25/19 1000  LATICACIDVEN 1.3    Recent Results (from the past 240 hour(s))  Blood Culture (routine x 2)     Status: None (Preliminary result)   Collection Time: 09/25/19 10:00 AM   Specimen: BLOOD  Result Value Ref Range Status   Specimen Description BLOOD RIGHT ANTECUBITAL  Final   Special Requests   Final    BOTTLES DRAWN AEROBIC AND ANAEROBIC Blood Culture results may not be optimal due to an excessive volume of blood received in culture bottles   Culture  Setup Time   Final    IN BOTH AEROBIC AND ANAEROBIC BOTTLES GRAM POSITIVE COCCI CRITICAL VALUE NOTED.  VALUE IS CONSISTENT WITH PREVIOUSLY REPORTED AND CALLED VALUE.    Culture   Final    NO GROWTH 1 DAY Performed at [facility], [place] [address] [place], [place] [address]    Report Status PENDING  Incomplete  Blood Culture (routine x 2)     Status: None  (Preliminary result)   Collection Time: 09/25/19 10:06 AM   Specimen: BLOOD  Result Value Ref Range Status   Specimen Description BLOOD BLOOD LEFT WRIST  Final   Special  Requests   Final    BOTTLES DRAWN AEROBIC AND ANAEROBIC Blood Culture adequate volume   Culture  Setup Time   Final    IN BOTH AEROBIC AND ANAEROBIC BOTTLES GRAM POSITIVE COCCI CRITICAL RESULT CALLED TO, READ BACK BY AND VERIFIED WITH: PHRMD C PIERCE @0436  09/26/19 BY S GEZAHEGN    Culture   Final    CULTURE REINCUBATED FOR BETTER GROWTH Performed at [facility], [place] [address] [place], [place] [address]    Report Status PENDING  Incomplete  Blood Culture ID Panel (Reflexed)     Status: Abnormal   Collection Time: 09/25/19 10:06 AM  Result Value Ref Range Status   Enterococcus species DETECTED (A) NOT DETECTED Final    Comment: CRITICAL RESULT CALLED TO, READ BACK BY AND VERIFIED WITH: PHRMD C PIERCE @0436  09/26/19 BY S GEZAHEGN    Vancomycin resistance NOT DETECTED NOT DETECTED Final   Listeria monocytogenes NOT DETECTED NOT DETECTED Final   Staphylococcus species NOT DETECTED NOT DETECTED Final   Staphylococcus aureus (BCID) NOT DETECTED NOT DETECTED Final   Streptococcus species NOT DETECTED NOT DETECTED Final   Streptococcus agalactiae NOT DETECTED NOT DETECTED Final   Streptococcus pneumoniae NOT DETECTED NOT DETECTED Final   Streptococcus pyogenes NOT DETECTED NOT DETECTED Final   Acinetobacter baumannii NOT DETECTED NOT DETECTED Final   Enterobacteriaceae species NOT DETECTED NOT DETECTED Final   Enterobacter cloacae complex NOT DETECTED NOT DETECTED Final   Escherichia coli NOT DETECTED NOT DETECTED Final   Klebsiella oxytoca NOT DETECTED NOT DETECTED Final   Klebsiella pneumoniae NOT DETECTED NOT DETECTED Final   Proteus species NOT DETECTED NOT DETECTED Final   Serratia marcescens NOT DETECTED NOT DETECTED Final   Haemophilus influenzae NOT DETECTED NOT DETECTED Final   Neisseria meningitidis  NOT DETECTED NOT DETECTED Final   Pseudomonas aeruginosa NOT DETECTED NOT DETECTED Final   Candida albicans NOT DETECTED NOT DETECTED Final   Candida glabrata NOT DETECTED NOT DETECTED Final   Candida krusei NOT DETECTED NOT DETECTED Final   Candida parapsilosis NOT DETECTED NOT DETECTED Final   Candida tropicalis NOT DETECTED NOT DETECTED Final    Comment: Performed at [facility], [address], [place], [place] [address]  Urine culture     Status: Abnormal (Preliminary result)   Collection Time: 09/25/19 11:34 AM   Specimen: In/Out Cath Urine  Result Value Ref Range Status   Specimen Description IN/OUT CATH URINE  Final   Special Requests NONE  Final   Culture (A)  Final    ENTEROCOCCUS FAECALIS SUSCEPTIBILITIES TO FOLLOW Performed at [facility], [place] [address], [place], [place] [address]    Report Status PENDING  Incomplete  SARS CORONAVIRUS 2 (TAT 6-24 HRS) Nasopharyngeal Nasopharyngeal Swab     Status: None   Collection Time: 09/25/19  3:41 PM   Specimen: Nasopharyngeal Swab  Result Value Ref Range Status   SARS Coronavirus 2 NEGATIVE NEGATIVE Final    Comment: (NOTE) SARS-CoV-2 target nucleic acids are NOT DETECTED. The SARS-CoV-2 RNA is generally detectable in upper and lower respiratory specimens during the acute phase of infection. Negative results do not preclude SARS-CoV-2 infection, do not rule out co-infections with other pathogens, and should not be used as the sole basis for treatment or other patient management decisions. Negative results must be combined with clinical observations, patient history, and epidemiological information. The expected result is Negative. Fact Sheet for Patients: SugarRoll.be Fact Sheet for Healthcare Providers: https://www.woods-mathews.com/ This test  is not yet approved or cleared by the [place] and  has been authorized for detection and/or diagnosis of SARS-CoV-2  by FDA under an Emergency Use Authorization (EUA). This EUA will remain  in effect (meaning this test can be used) for the duration of the COVID-19 declaration under Section 56 4(b)(1) of the Act, 21 U.S.C. section 360bbb-3(b)(1), unless the authorization is terminated or revoked sooner. Performed at [facility], [place] [address], [place], [place] [address]       Radiology Studies: Ct Head Wo Contrast  Result Date: 09/25/2019 CLINICAL DATA:  83 year old male with altered mental status. EXAM: CT HEAD WITHOUT CONTRAST TECHNIQUE: Contiguous axial images were obtained from the base of the skull through the vertex without intravenous contrast. COMPARISON:  Head CT dated 08/21/2015. FINDINGS: Brain: Mild to moderate age-related atrophy and chronic microvascular ischemic changes. There is no acute intracranial hemorrhage. No mass effect or midline shift. No extra-axial fluid collection. Vascular: No hyperdense vessel or unexpected calcification. Skull: Normal. Negative for fracture or focal lesion. Sinuses/Orbits: The visualized paranasal sinuses are clear. Minimal left mastoid effusions. The right mastoid air cells are clear. No air-fluid level. Other: Focal area of defect in the right maxilla at the lateral incisor tooth, chronic. IMPRESSION: 1. No acute intracranial hemorrhage. 2. Age-related atrophy and chronic microvascular ischemic changes. Electronically Signed   By: Anner Crete M.D.   On: 09/25/2019 11:58   Dg Chest Port 1 View  Result Date: 09/25/2019 CLINICAL DATA:  Altered mental status. EXAM: PORTABLE CHEST 1 VIEW COMPARISON:  08/21/2019 FINDINGS: The heart size and mediastinal contours are within normal limits. Both lungs are clear. The visualized skeletal structures are unremarkable. IMPRESSION: No active disease. Electronically Signed   By: Kerby Moors M.D.   On: 09/25/2019 10:04   Dg Swallowing Func-speech Pathology  Result Date: 09/26/2019 Objective Swallowing  Evaluation: Type of Study: MBS-Modified Barium Swallow Study  Patient Details Name: ZAIVION GELINAS MRN: [id number] Date of Birth: Dec 13, 1935 Today's Date: 09/26/2019 Time: SLP Start Time (ACUTE ONLY): 34 -SLP Stop Time (ACUTE ONLY): 1340 SLP Time Calculation (min) (ACUTE ONLY): 25 min Past Medical History: Past Medical History: Diagnosis Date  Abdominal aortic atherosclerosis ([place])   Anginal pain ([place])   last noted 08/17/19  Bone cancer ([place])   Left hip  BPPV (benign paroxysmal positional vertigo)   Bullous pemphigoid   CAD (coronary artery disease)   a.  LHC 8/16: Mid to distal LAD 30%, OM1 40%, proximal mid RCA 40%, distal RCA 60% >> FFR 0.69  >> PCI: 3 x 15 mm Resolute DES  Depression   Esophageal reflux   Family history of adverse reaction to anesthesia   "daughter has PONV"  Grade I diastolic dysfunction [id number]  Noted on ECHO   Grade I diastolic dysfunction [id number]  Noted on ECHO  H/O blood clots   "get them in my stool and urine" (11/12/2015)  Heart murmur   Hepatic lesion 12/08/2015  Stable 8 mm right hepatic lesion  History of aortic stenosis   a. peak to peak gradient by LHC 8/16:  37 mmHg (moderately severe)   History of appendicitis 2016  History of blood transfusion 12/2018  History of herpes labialis   History of ITP   2018, thrombocytopenia  History of kidney stones   Hx of radiation therapy 04/14/13-06/09/13  prostate 7800 cGy, 40 sessions, seminal vesicles 5600 cGy 40 sessions  Hydrocele 2006  Small  Hyperlipidemia   Insomnia   resolved  LVH (  left ventricular hypertrophy)   Moderate, noted on ECHO 09/2018  Malignant melanoma in situ ([place]) 09/04/2016  Right neck and chest  Melanoma ([place])   pt denies  Metastasis from malignant neoplasm of prostate ([place]) 02/25/2019  Prostate cancer ([place]) dx'd 2014  Radiation proctitis   Renal mass 2017  Bilateral renal masses  S/P skin biopsy 04/09/2019  subepidermal cell poor vesicle , Linear IGG and C3 the basement membrane  Suprapubic  catheter ([place])   Wears hearing aid in both ears  Past Surgical History: Past Surgical History: Procedure Laterality Date  CARDIAC CATHETERIZATION N/A 04/21/2015  Procedure: Right/Left Heart Cath and Coronary Angiography;  Surgeon: [name], MD;  Location: [facility];  Service: Cardiovascular;  Laterality: N/A;  CARDIAC CATHETERIZATION N/A 06/18/2015  Procedure: Intravascular Pressure Wire/FFR Study;  Surgeon: [name], MD;  Location: [facility];  Service: Cardiovascular;  Laterality: N/A;  CARDIAC CATHETERIZATION N/A 06/18/2015  Procedure: Coronary Stent Intervention;  Surgeon: [name], MD;  Location: [facility];  Service: Cardiovascular;  Laterality: N/A;  CARDIAC CATHETERIZATION N/A 06/18/2015  Procedure: Right/Left Heart Cath and Coronary Angiography;  Surgeon: [name], MD;  Location: [facility];  Service: Cardiovascular;  Laterality: N/A;  CARDIAC CATHETERIZATION N/A 06/23/2015  Procedure: Left Heart Cath and Cors/Grafts Angiography;  Surgeon: [name], MD;  Location: [facility];  Service: Cardiovascular;  Laterality: N/A;  CARDIAC CATHETERIZATION N/A 01/17/2016  Procedure: Right/Left Heart Cath and Coronary Angiography;  Surgeon: [name], MD;  Location: [facility];  Service: Cardiovascular;  Laterality: N/A;  CATARACT EXTRACTION, BILATERAL    COLONOSCOPY  06/26/2017  CYSTOSCOPY WITH BIOPSY N/A 07/30/2018  Procedure: CYSTOSCOPY WITH BIOPSY/FULGURATION, CYSTOLTHOLAPAXY;  Surgeon: [name], MD;  Location: [facility];  Service: Urology;  Laterality: N/A;  CYSTOSCOPY WITH URETHRAL DILATATION N/A 08/18/2019  Procedure: CYSTOSCOPY WITH URETHRAL DILATATION USING BALLOON OR LASER/ SPURO PUBIC TUBE CHANGE LASER EXCISION OF URETHRAL STRICTURE RETROGRADE URETHROGRAM ANTEGRADE CYSTOGRAM;  Surgeon: [name], MD;  Location: [facility];  Service: Urology;  Laterality: N/A;  FRACTURE SURGERY    INGUINAL HERNIA REPAIR     patient does not remember this procedure  LAPAROSCOPIC APPENDECTOMY N/A 11/16/2015  Procedure: APPENDECTOMY LAPAROSCOPIC;  Surgeon: [name], MD;  Location: [facility];  Service: General;  Laterality: N/A;  LEFT HEART CATH AND CORONARY ANGIOGRAPHY N/A 12/26/2016  Procedure: Left Heart Cath and Coronary Angiography;  Surgeon: Troy Sine, MD;  Location: [facility];  Service: Cardiovascular;  Laterality: N/A;  MELANOMA EXCISION Right 10/24/2016  Procedure: WIDE EXCISION MELANOMA RIGHT NECK AND RIGHT CHEST TIMES 2;  Surgeon: [name], MD;  Location: [facility];  Service: General;  Laterality: Right;  right medial and lateral lesion of chest and right neck  NASAL FRACTURE SURGERY    "broken years ago; several ORs to correct it"  [place] BIOPSY  2014  "needle biopsy"  TEE WITHOUT CARDIOVERSION N/A 02/15/2016  Procedure: TRANSESOPHAGEAL ECHOCARDIOGRAM (TEE);  Surgeon: [name], MD;  Location: [facility];  Service: Open Heart Surgery;  Laterality: N/A;  TEE WITHOUT CARDIOVERSION N/A 08/26/2019  Procedure: TRANSESOPHAGEAL ECHOCARDIOGRAM (TEE);  Surgeon: Pixie Casino, MD;  Location: [facility];  Service: Cardiovascular;  Laterality: N/A;  TONSILLECTOMY    TRANSCATHETER AORTIC VALVE REPLACEMENT, TRANSFEMORAL  02/15/2016  TRANSCATHETER AORTIC VALVE REPLACEMENT, TRANSFEMORAL N/A 02/15/2016  Procedure: TRANSCATHETER AORTIC VALVE REPLACEMENT, TRANSFEMORAL;  Surgeon: [name], MD;  Location: [facility];  Service: Open Heart Surgery;  Laterality: N/A;  TRANSURETHRAL RESECTION OF PROSTATE  12/23/2018  Procedure: CYSTOSCOPY WITH  BLADDER BIOPSY WITH FULGERATION/ TRANSURETHRAL RESECTION PROSTATE  AND PROSTATE BIOPSY;  Surgeon: [name], MD;  Location: [facility];  Service: Urology;; HPI: 83 y.o. male with medical history significant of severe aortic stenosis status post TAVR, ITP, prostate cancer status post external beam radiation therapy with metastasis to  left iliac bone., radiation prostatitis, hyperlipidemia, depression, GERD, coronary artery disease, chronic diastolic congestive heart failure, bullous pemphigus-on methotrexate, macrocytic anemia, alcohol abuse presented to emergency department with altered mental status. CT head negative for acute abnormality.   Subjective: alert, confused Assessment / Plan / Recommendation CHL IP CLINICAL IMPRESSIONS 09/26/2019 Clinical Impression Pt presents with a moderate oropharyngeal dysphagia marked by impaired bolus control/cohesion, decreased propulsion of all consistencies through pharynx with poor pharyngeal squeeze and inconsistent epiglottic inversion.  These deficits led to diffuse barium residue in the valleculae and pyriforms.  Residue accumulated throughout test, leading to eventual spillage into larynx, reaching the vocal folds.  There was no observed aspiration, but pt is at high risk given this decreased overall function. After returning to the room, pt's wife was able to view the video and we discussed his current swallowing status, including aspiration risk and its relationship to overall mentation and dementia.  Mrs. Leong did not want to pursue temporary NG feeds at this time. We agreed the better option may be to start a modified diet of dysphagia 1, thin liquids; provide careful supervision with meals, and hold tray if pt begins coughing.  As mentation improves, we would anticipate improved swallowing, at least to baseline. D/W RN. SLP will follow for safety/diet progression.  SLP Visit Diagnosis Dysphagia, oropharyngeal phase (R13.12) Attention and concentration deficit following -- Frontal lobe and executive function deficit following -- Impact on safety and function Moderate aspiration risk   CHL IP TREATMENT RECOMMENDATION 09/26/2019 Treatment Recommendations Therapy as outlined in treatment plan below   Prognosis 09/26/2019 Prognosis for Safe Diet Advancement Fair Barriers to Reach Goals Cognitive  deficits Barriers/Prognosis Comment -- CHL IP DIET RECOMMENDATION 09/26/2019 SLP Diet Recommendations Dysphagia 1 (Puree) solids;Thin liquid Liquid Administration via Cup;Straw Medication Administration Crushed with puree Compensations Minimize environmental distractions Postural Changes Seated upright at 90 degrees   CHL IP OTHER RECOMMENDATIONS 09/26/2019 Recommended Consults -- Oral Care Recommendations Oral care BID Other Recommendations --   CHL IP FOLLOW UP RECOMMENDATIONS 09/26/2019 Follow up Recommendations Other (comment)   CHL IP FREQUENCY AND DURATION 09/26/2019 Speech Therapy Frequency (ACUTE ONLY) min 2x/week Treatment Duration 2 weeks      CHL IP ORAL PHASE 09/26/2019 Oral Phase Impaired Oral - Pudding Teaspoon -- Oral - Pudding Cup -- Oral - Honey Teaspoon -- Oral - Honey Cup -- Oral - Nectar Teaspoon -- Oral - Nectar Cup -- Oral - Nectar Straw -- Oral - Thin Teaspoon -- Oral - Thin Cup -- Oral - Thin Straw Decreased bolus cohesion Oral - Puree Weak lingual manipulation;Delayed oral transit;Decreased bolus cohesion Oral - Mech Soft Weak lingual manipulation;Decreased bolus cohesion;Delayed oral transit Oral - Regular -- Oral - Multi-Consistency -- Oral - Pill -- Oral Phase - Comment --  CHL IP PHARYNGEAL PHASE 09/26/2019 Pharyngeal Phase Impaired Pharyngeal- Pudding Teaspoon -- Pharyngeal -- Pharyngeal- Pudding Cup -- Pharyngeal -- Pharyngeal- Honey Teaspoon -- Pharyngeal -- Pharyngeal- Honey Cup -- Pharyngeal -- Pharyngeal- Nectar Teaspoon -- Pharyngeal -- Pharyngeal- Nectar Cup -- Pharyngeal -- Pharyngeal- Nectar Straw -- Pharyngeal -- Pharyngeal-  Thin Teaspoon -- Pharyngeal -- Pharyngeal- Thin Cup -- Pharyngeal -- Pharyngeal- Thin Straw Delayed swallow initiation-pyriform sinuses;Reduced pharyngeal peristalsis;Reduced epiglottic inversion;Reduced tongue base retraction;Reduced airway/laryngeal closure;Penetration/Apiration after swallow;Pharyngeal residue - valleculae;Pharyngeal residue - pyriform  Pharyngeal Material enters airway, CONTACTS cords and not ejected out Pharyngeal- Puree Reduced pharyngeal peristalsis;Reduced epiglottic inversion;Reduced tongue base retraction;Pharyngeal residue - valleculae;Pharyngeal residue - pyriform;Delayed swallow initiation-vallecula Pharyngeal Material does not enter airway Pharyngeal- Mechanical Soft Reduced pharyngeal peristalsis;Reduced epiglottic inversion;Reduced tongue base retraction;Pharyngeal residue - valleculae;Pharyngeal residue - pyriform;Delayed swallow initiation-vallecula Pharyngeal Material does not enter airway Pharyngeal- Regular -- Pharyngeal -- Pharyngeal- Multi-consistency -- Pharyngeal -- Pharyngeal- Pill -- Pharyngeal -- Pharyngeal Comment --  CHL IP CERVICAL ESOPHAGEAL PHASE 09/26/2019 Cervical Esophageal Phase (No Data) Pudding Teaspoon -- Pudding Cup -- Honey Teaspoon -- Honey Cup -- Nectar Teaspoon -- Nectar Cup -- Nectar Straw -- Thin Teaspoon -- Thin Cup -- Thin Straw -- Puree -- Mechanical Soft -- Regular -- Multi-consistency -- Pill -- Cervical Esophageal Comment -- Juan Quam Laurice 09/26/2019, 4:15 PM                Scheduled Meds:  Chlorhexidine Gluconate Cloth  6 each Topical Daily   Continuous Infusions:  sodium chloride Stopped (09/25/19 2306)   ampicillin (OMNIPEN) IV 2 g (09/26/19 1015)   cefTRIAXone (ROCEPHIN)  IV 2 g (09/26/19 1008)     LOS: 1 day    Time spent: 25 minutes spent in the coordination of care today.    Jonnie Finner, DO [facility] Pager [phone]  If 7PM-7AM, please contact night-coverage [email or web address] Password [email or web address] 09/26/2019, 4:20 PM

## 2019-09-26 NOTE — Progress Notes (Signed)
PHARMACY - PHYSICIAN COMMUNICATION CRITICAL VALUE ALERT - BLOOD CULTURE IDENTIFICATION (BCID)  Corey Oneill is an 83 y.o. male who presented to [facility] on 09/25/2019 with a chief complaint of altered mental status  Assessment:  GPC bacteremia - source unknown  Name of physician (or Provider) Contacted: None  Current antibiotics: Vancomycin 750 mg IV q12hr  Changes to prescribed antibiotics recommended:  Patient is on recommended antibiotics - No changes needed  Results for orders placed or performed during the hospital encounter of 09/25/19  Blood Culture ID Panel (Reflexed) (Collected: 09/25/2019 10:06 AM)  Result Value Ref Range   Enterococcus species DETECTED (A) NOT DETECTED   Vancomycin resistance NOT DETECTED NOT DETECTED   Listeria monocytogenes NOT DETECTED NOT DETECTED   Staphylococcus species NOT DETECTED NOT DETECTED   Staphylococcus aureus (BCID) NOT DETECTED NOT DETECTED   Streptococcus species NOT DETECTED NOT DETECTED   Streptococcus agalactiae NOT DETECTED NOT DETECTED   Streptococcus pneumoniae NOT DETECTED NOT DETECTED   Streptococcus pyogenes NOT DETECTED NOT DETECTED   Acinetobacter baumannii NOT DETECTED NOT DETECTED   Enterobacteriaceae species NOT DETECTED NOT DETECTED   Enterobacter cloacae complex NOT DETECTED NOT DETECTED   Escherichia coli NOT DETECTED NOT DETECTED   Klebsiella oxytoca NOT DETECTED NOT DETECTED   Klebsiella pneumoniae NOT DETECTED NOT DETECTED   Proteus species NOT DETECTED NOT DETECTED   Serratia marcescens NOT DETECTED NOT DETECTED   Haemophilus influenzae NOT DETECTED NOT DETECTED   Neisseria meningitidis NOT DETECTED NOT DETECTED   Pseudomonas aeruginosa NOT DETECTED NOT DETECTED   Candida albicans NOT DETECTED NOT DETECTED   Candida glabrata NOT DETECTED NOT DETECTED   Candida krusei NOT DETECTED NOT DETECTED   Candida parapsilosis NOT DETECTED NOT DETECTED   Candida tropicalis NOT DETECTED NOT DETECTED    Alanda Slim,  PharmD, FCCM Clinical Pharmacist Please see AMION for all Pharmacists' Contact Phone Numbers 09/26/2019, 4:41 AM

## 2019-09-26 NOTE — NC FL2 (Signed)
[place] MEDICAID FL2 LEVEL OF CARE SCREENING TOOL     IDENTIFICATION  Patient Name: Corey Oneill Birthdate: 04/18/1936 Sex: male Admission Date (Current Location): 09/25/2019  [facility] and [place] Number:  Herbalist and Address:  The . [facility], [place] [address], [place], [place] [address]      Provider Number: [id number]  Attending Physician Name and Address:  Jonnie Finner, DO  Relative Name and Phone Number:       Current Level of Care: Hospital Recommended Level of Care: [place] Prior Approval Number:    Date Approved/Denied:   PASRR Number: Manual review  Discharge Plan: SNF    Current Diagnoses: Patient Active Problem List   Diagnosis Date Noted  . Altered mental state 09/25/2019  . Idiopathic thrombocytopenic purpura (ITP) (HCC) 09/25/2019  . Macrocytic anemia 09/25/2019  . SIRS (systemic inflammatory response syndrome) ([place]) 09/25/2019  . Bacteremia due to Enterococcus 09/11/2019  . PICC (peripherally inserted central catheter) in place 09/11/2019  . Sepsis ([place]) 08/21/2019  . Bullous pemphigoid 06/12/2019  . 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 Radiation proctitis   . Hx of radiation therapy   . Heart murmur   . H/O blood clots   . Family history of adverse reaction to anesthesia   . CAD (coronary artery disease)   . Aortic stenosis   . Anginal pain ([place])   . History of ITP 02/26/2017  . Malignant melanoma of neck ([place]) 10/02/2016  . Malignant melanoma in situ ([place]) 09/04/2016  . 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 S/P appendectomy 11/12/2015  . Esophageal reflux 03/22/2015  . Depression 11/20/2014  . Prostate cancer ([place]) 03/05/2013  . Erectile dysfunction   . Hyperlipidemia 06/07/2007    Orientation RESPIRATION BLADDER Height & Weight     Self  Normal Incontinent Weight: 139 lb 8.8 oz (63.3 kg) Height:  5\' 10"  (177.8 cm)  BEHAVIORAL  SYMPTOMS/MOOD NEUROLOGICAL BOWEL NUTRITION STATUS      Incontinent Diet(see DC summary)  AMBULATORY STATUS COMMUNICATION OF NEEDS Skin   Extensive Assist Verbally Normal                       Personal Care Assistance Level of Assistance  Bathing, Feeding, Dressing Bathing Assistance: Maximum assistance Feeding assistance: Limited assistance Dressing Assistance: Maximum assistance     Functional Limitations Info             SPECIAL CARE FACTORS FREQUENCY  PT (By licensed PT), OT (By licensed OT)     PT Frequency: 5x/wk OT Frequency: 5x/wk            Contractures Contractures Info: Not present    Additional Factors Info  Code Status, Allergies Code Status Info: DNR Allergies Info: Prednisone           Current Medications (09/26/2019):  This is the current hospital active medication list Current Facility-Administered Medications  Medication Dose Route Frequency Provider Last Rate Last Dose  . 0.9 %  sodium chloride infusion   Intravenous Continuous Pahwani, Michell Heinrich, MD   Stopped at 09/25/19 2306  . acetaminophen (TYLENOL) tablet 650 mg  650 mg Oral Q6H PRN Pahwani, Rinka R, MD       Or  . acetaminophen (TYLENOL) suppository 650 mg  650 mg Rectal Q6H PRN Pahwani, Rinka R, MD      . ampicillin (OMNIPEN) 2 g in sodium chloride 0.9 % 100 mL  IVPB  2 g Intravenous Q4H [place], FNP 300 mL/hr at 09/26/19 1015 2 g at 09/26/19 1015  . cefTRIAXone (ROCEPHIN) 2 g in sodium chloride 0.9 % 100 mL IVPB  2 g Intravenous Q12H [place], FNP 200 mL/hr at 09/26/19 1008 2 g at 09/26/19 1008  . Chlorhexidine Gluconate Cloth 2 % PADS 6 each  6 each Topical Daily Pahwani, Rinka R, MD   6 each at 09/26/19 1013  . labetalol (NORMODYNE) injection 10 mg  10 mg Intravenous Q6H PRN Pahwani, Rinka R, MD      . ondansetron (ZOFRAN) tablet 4 mg  4 mg Oral Q6H PRN Pahwani, Rinka R, MD       Or  . ondansetron (ZOFRAN) injection 4 mg  4 mg Intravenous Q6H PRN Pahwani, Rinka R,  MD         Discharge Medications: Please see discharge summary for a list of discharge medications.  Relevant Imaging Results:  Relevant Lab Results:   Additional Information SS#: [id number]; Will need long term IV antibiotics  Geralynn Ochs, LCSW

## 2019-09-26 NOTE — Progress Notes (Signed)
Modified Barium Swallow Progress Note  Patient Details  Name: Corey Oneill MRN: [id number] Date of Birth: 28-Sep-1936  Today's Date: 09/26/2019  Modified Barium Swallow completed.  Full report located under Chart Review in the Imaging Section.  Brief recommendations include the following:  Clinical Impression  Pt presents with a moderate oropharyngeal dysphagia marked by impaired bolus control/cohesion, decreased propulsion of all consistencies through pharynx with poor pharyngeal squeeze and inconsistent epiglottic inversion.  These deficits led to diffuse barium residue in the valleculae and pyriforms.  Residue accumulated throughout test, leading to eventual spillage into larynx, reaching the vocal folds.  There was no observed aspiration, but pt is at high risk given this decreased overall function. After returning to the room, pt's wife was able to view the video and we discussed his current swallowing status, including aspiration risk and its relationship to overall mentation and dementia.  Mrs. Abud did not want to pursue temporary NG feeds at this time. We agreed the better option may be to start a modified diet of dysphagia 1, thin liquids; provide careful supervision with meals, and hold tray if pt begins coughing.  As mentation improves, we would anticipate improved swallowing, at least to baseline. D/W RN. SLP will follow for safety/diet progression.    Swallow Evaluation Recommendations       SLP Diet Recommendations: Dysphagia 1 (Puree) solids;Thin liquid   Liquid Administration via: Cup;Straw   Medication Administration: Crushed with puree   Supervision: Full assist for feeding;Full supervision/cueing for compensatory strategies   Compensations: Minimize environmental distractions   Postural Changes: Seated upright at 90 degrees   Oral Care Recommendations: Oral care BID      Khamille Beynon L. Tivis Ringer, [place] Office number  [phone] Pager [phone]   Corey Oneill 09/26/2019,4:14 PM

## 2019-09-26 NOTE — TOC Initial Note (Signed)
Transition of Care [facility]) - Initial/Assessment Note    Patient Details  Name: Corey Oneill MRN: [id number] Date of Birth: 07-Oct-1936  Transition of Care [facility]) CM/SW Contact:    Geralynn Ochs, LCSW Phone Number: 09/26/2019, 12:18 PM  Clinical Narrative:   CSW met with patient's wife, [name], at bedside to begin discussions on discharge plans. Per [name], patient had just finished a round of IV antibiotics last Friday. Patient was receiving therapy with [facility] and infusion RN with [facility], and that all went fine per the wife. CSW discussed the patient's current functioning and level of assistance at home, and wife works during the day so the patient would be home alone for most of the day. Wife in agreement that if patient continues to need the intense rehab, that SNF would be beneficial for him. Plus, wife said that he struggles with depression and being home alone for most of the day while he's also sick probably isn't good for his mental health. [name] indicated that she had heard of [facility], but thought she heard that they had a new COVID case. CSW to print CMS list and provide to wife for review.  CSW faxed out referral for SNF. CSW to follow.          Expected Discharge Plan: Skilled Nursing Facility Barriers to Discharge: Continued Medical Work up   Patient Goals and CMS Choice Patient states their goals for this hospitalization and ongoing recovery are:: patient unable to participate in goal setting at this time due to confusion CMS Medicare.gov Compare Post Acute Care list provided to:: Patient Represenative (must comment) Choice offered to / list presented to : Spouse  Expected Discharge Plan and Services Expected Discharge Plan: [place] Choice: [place] Living arrangements for the past 2 months: Single Family Home                                      Prior Living Arrangements/Services Living  arrangements for the past 2 months: Single Family Home Lives with:: Self Patient language and need for interpreter reviewed:: No Do you feel safe going back to the place where you live?: Yes      Need for Family Participation in Patient Care: Yes (Comment) Care giver support system in place?: No (comment) Current home services: DME Criminal Activity/Legal Involvement Pertinent to Current Situation/Hospitalization: No - Comment as needed  Activities of Daily Living      Permission Sought/Granted Permission sought to share information with : Facility Sport and exercise psychologist, Family Supports Permission granted to share information with : Yes, Verbal Permission Granted  Share Information with NAME: [name]  Permission granted to share info w AGENCY: SNF  Permission granted to share info w Relationship: Wife     Emotional Assessment Appearance:: Appears stated age Attitude/Demeanor/Rapport: Unable to Assess Affect (typically observed): Unable to Assess Orientation: : Oriented to Self Alcohol / Substance Use: Alcohol Use Psych Involvement: No (comment)  Admission diagnosis:  Delirium [R41.0] Sepsis, due to unspecified organism, unspecified whether acute organ dysfunction present [facility]) [A41.9] Patient Active Problem List   Diagnosis Date Noted  . Altered mental state 09/25/2019  . Idiopathic thrombocytopenic purpura (ITP) (HCC) 09/25/2019  . Macrocytic anemia 09/25/2019  . SIRS (systemic inflammatory response syndrome) ([place]) 09/25/2019  . Bacteremia due to Enterococcus 09/11/2019  . PICC (peripherally inserted central catheter) in place 09/11/2019  .  Sepsis ([place]) 08/21/2019  . Bullous pemphigoid 06/12/2019  . Metastasis from malignant neoplasm of prostate ([place]) 02/25/2019  . Radiation proctitis   . Hx of radiation therapy   . Heart murmur   . H/O blood clots   . Family history of adverse reaction to anesthesia   . CAD (coronary artery disease)   . Aortic stenosis   . Anginal  pain ([place])   . History of ITP 02/26/2017  . Malignant melanoma of neck ([place]) 10/02/2016  . Malignant melanoma in situ ([place]) 09/04/2016  . 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 S/P appendectomy 11/12/2015  . Esophageal reflux 03/22/2015  . Depression 11/20/2014  . Prostate cancer ([place]) 03/05/2013  . Erectile dysfunction   . Hyperlipidemia 06/07/2007   PCP:  [name], MD Pharmacy:   [place], [place] [address] Phone: [phone] Fax: [phone]     Social Determinants of Health (SDOH) Interventions    Readmission Risk Interventions No flowsheet data found.

## 2019-09-26 NOTE — Consult Note (Signed)
[name] for Infectious Disease    Date of Admission:  09/25/2019     Total days of antibiotics 2               Reason for Consult: Enterococcus bacteremia  Referring Provider: CHAMP / [place] Primary Care Provider: [name], MD   ASSESSMENT:  Corey Oneill is an 83 year old male with recurrent Enterococcus bacteremia having previously completed treatment on 09/19/2019 with 4 weeks of ampicillin.  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 Source is currently unclear although may be his prosthetic valve.  Antimicrobials changed to ampicillin and ceftriaxone.  Recheck blood cultures tomorrow.  Repeat TTE.  Likely unable to obtain TEE.  Will need prolonged therapy of at least 6 weeks with ceftriaxone and ampicillin given concern for endocarditis.  PLAN:  1. Discontinue vancomycin. 2. Continue ceftriaxone and add ampicillin. 3. Recheck blood cultures. 4. Check TTE.   Principal Problem:   Altered mental state Active Problems:   Hyperlipidemia   Prostate cancer ([place])   Depression   S/P TAVR (transcatheter aortic valve replacement)   Hx of radiation therapy   Bullous pemphigoid   Idiopathic thrombocytopenic purpura (ITP) (HCC)   Macrocytic anemia   SIRS (systemic inflammatory response syndrome) ([place])   . Chlorhexidine Gluconate Cloth  6 each Topical Daily     HPI: Corey Oneill is a 83 y.o. male with previous medical history of severe aortic stenosis s/p TAVR, ITP, prostate cancer s/p radiation therapy and complicated with metastasis to the left iliac bone, chronic diastolic heart failure, bullous pemphigus-on methotrexate, and alcohol abuse and recent treatment in October with enterococcus bacteremia admitted with altered mental status of 2 day duration.  Corey Oneill is known to the ID service and was last seen during his most recent hospitalization on 08/21/19 with findings of Enterococcus faecalis bacteremia which  was treated with 4 weeks of ampicillin completing treatment without complication on 0000000. TTE was without evidence of endocarditis and TEE unable to be performed due to hiatal hernia.   In the ED [name] was found to be febrile with tempearture of 102.2., tachycardic and tachypneic. WBC count was 6.6.  Chest x-ray unremarkable with no evidence of pneumonia.  CT scan of the head with no acute intracranial hemorrhage.  Blood cultures obtained and he was started on vancomycin and ceftriaxone.  Blood cultures obtained on admission now positive for Enterococcus species in 3/4 bottles.  Fever curve has been downtrending since admission.  Current antimicrobials are vancomycin and ceftriaxone.  Review of Systems: Review of Systems  Unable to perform ROS: Acuity of condition     Past Medical History:  Diagnosis Date  . Abdominal aortic atherosclerosis ([place])   . Anginal pain ([place])    last noted 08/17/19  . Bone cancer ([place])    Left hip  . BPPV (benign paroxysmal positional vertigo)   . Bullous pemphigoid   . CAD (coronary artery disease)    a.  LHC 8/16: Mid to distal LAD 30%, OM1 40%, proximal mid RCA 40%, distal RCA 60% >> FFR 0.69  >> PCI: 3 x 15 mm Resolute DES  . Depression   . Esophageal reflux   . Family history of adverse reaction to anesthesia    "daughter has PONV"  . Grade I diastolic dysfunction [id number]   Noted on ECHO   . Grade I diastolic dysfunction [id number]   Noted on ECHO  . H/O blood clots    "get  them in my stool and urine" (11/12/2015)  . Heart murmur   . Hepatic lesion 12/08/2015   Stable 8 mm right hepatic lesion  . History of aortic stenosis    a. peak to peak gradient by LHC 8/16:  37 mmHg (moderately severe)   . History of appendicitis 2016  . History of blood transfusion 12/2018  . History of herpes labialis   . History of ITP    2018, thrombocytopenia  . History of kidney stones   . Hx of radiation therapy 04/14/13-06/09/13   prostate 7800 cGy, 40  sessions, seminal vesicles 5600 cGy 40 sessions  . Hydrocele 2006   Small  . Hyperlipidemia   . Insomnia    resolved  . LVH (left ventricular hypertrophy)    Moderate, noted on ECHO 09/2018  . Malignant melanoma in situ ([place]) 09/04/2016   Right neck and chest  . Melanoma ([place])    pt denies  . Metastasis from malignant neoplasm of prostate ([place]) 02/25/2019  . Prostate cancer ([place]) dx'd 2014  . Radiation proctitis   . Renal mass 2017   Bilateral renal masses  . S/P skin biopsy 04/09/2019   subepidermal cell poor vesicle , Linear IGG and C3 the basement membrane  . Suprapubic catheter ([place])   . Wears hearing aid in both ears     Social History   Tobacco Use  . Smoking status: Former Smoker    Packs/day: 0.00    Types: Cigarettes  . Smokeless tobacco: Never Used  . Tobacco comment: "quit smoking cigarettes in the 1960s; quit smoking cigars in the 1990s  Substance Use Topics  . Alcohol use: Yes    Comment: 2 DRINKS PER DAY  . Drug use: No    Family History  Problem Relation Age of Onset  . Brain cancer Father   . Lymphoma Mother   . Depression Daughter     Allergies  Allergen Reactions  . Prednisone Other (See Comments)    Makes depressed    OBJECTIVE: Blood pressure (!) 156/79, pulse 94, temperature 98 F (36.7 C), temperature source Oral, resp. rate 18, height 5\' 10"  (1.778 m), weight 63.3 kg, SpO2 97 %.  Physical Exam Constitutional:      General: He is not in acute distress.    Appearance: He is well-developed. He is ill-appearing.  Cardiovascular:     Rate and Rhythm: Normal rate and regular rhythm.     Heart sounds: Normal heart sounds.  Pulmonary:     Effort: Pulmonary effort is normal.     Breath sounds: Normal breath sounds.  Skin:    General: Skin is warm and dry.  Neurological:     Mental Status: He is oriented to person, place, and time. He is lethargic.     Comments: Oriented to person and time. Confused as to why he is here.   Psychiatric:         Behavior: Behavior normal.        Thought Content: Thought content normal.        Judgment: Judgment normal.     Lab Results Lab Results  Component Value Date   WBC 6.6 09/26/2019   HGB 12.7 (L) 09/26/2019   HCT 36.4 (L) 09/26/2019   MCV 96.8 09/26/2019   PLT 31 (L) 09/26/2019    Lab Results  Component Value Date   CREATININE 0.67 09/26/2019   BUN 8 09/26/2019   NA 130 (L) 09/26/2019   K 3.5 09/26/2019   CL 99  09/26/2019   CO2 19 (L) 09/26/2019    Lab Results  Component Value Date   ALT 9 09/25/2019   AST 17 09/25/2019   ALKPHOS 70 09/25/2019   BILITOT 0.8 09/25/2019     Microbiology: Recent Results (from the past 240 hour(s))  Blood Culture (routine x 2)     Status: None (Preliminary result)   Collection Time: 09/25/19 10:00 AM   Specimen: BLOOD  Result Value Ref Range Status   Specimen Description BLOOD RIGHT ANTECUBITAL  Final   Special Requests   Final    BOTTLES DRAWN AEROBIC AND ANAEROBIC Blood Culture results may not be optimal due to an excessive volume of blood received in culture bottles   Culture  Setup Time   Final    AEROBIC BOTTLE ONLY GRAM POSITIVE COCCI CRITICAL VALUE NOTED.  VALUE IS CONSISTENT WITH PREVIOUSLY REPORTED AND CALLED VALUE. Performed at [facility], [place] [address] [place], [place] [address]    Culture PENDING  Incomplete   Report Status PENDING  Incomplete  Blood Culture (routine x 2)     Status: None (Preliminary result)   Collection Time: 09/25/19 10:06 AM   Specimen: BLOOD  Result Value Ref Range Status   Specimen Description BLOOD BLOOD LEFT WRIST  Final   Special Requests   Final    BOTTLES DRAWN AEROBIC AND ANAEROBIC Blood Culture adequate volume   Culture  Setup Time   Final    IN BOTH AEROBIC AND ANAEROBIC BOTTLES GRAM POSITIVE COCCI Organism ID to follow CRITICAL RESULT CALLED TO, READ BACK BY AND VERIFIED WITH: PHRMD C PIERCE @0436  09/26/19 BY S GEZAHEGN Performed at [facility], [place] [address], [place], [place] [address]    Culture PENDING  Incomplete   Report Status PENDING  Incomplete  Blood Culture ID Panel (Reflexed)     Status: Abnormal   Collection Time: 09/25/19 10:06 AM  Result Value Ref Range Status   Enterococcus species DETECTED (A) NOT DETECTED Final    Comment: CRITICAL RESULT CALLED TO, READ BACK BY AND VERIFIED WITH: PHRMD C PIERCE @0436  09/26/19 BY S GEZAHEGN    Vancomycin resistance NOT DETECTED NOT DETECTED Final   Listeria monocytogenes NOT DETECTED NOT DETECTED Final   Staphylococcus species NOT DETECTED NOT DETECTED Final   Staphylococcus aureus (BCID) NOT DETECTED NOT DETECTED Final   Streptococcus species NOT DETECTED NOT DETECTED Final   Streptococcus agalactiae NOT DETECTED NOT DETECTED Final   Streptococcus pneumoniae NOT DETECTED NOT DETECTED Final   Streptococcus pyogenes NOT DETECTED NOT DETECTED Final   Acinetobacter baumannii NOT DETECTED NOT DETECTED Final   Enterobacteriaceae species NOT DETECTED NOT DETECTED Final   Enterobacter cloacae complex NOT DETECTED NOT DETECTED Final   Escherichia coli NOT DETECTED NOT DETECTED Final   Klebsiella oxytoca NOT DETECTED NOT DETECTED Final   Klebsiella pneumoniae NOT DETECTED NOT DETECTED Final   Proteus species NOT DETECTED NOT DETECTED Final   Serratia marcescens NOT DETECTED NOT DETECTED Final   Haemophilus influenzae NOT DETECTED NOT DETECTED Final   Neisseria meningitidis NOT DETECTED NOT DETECTED Final   Pseudomonas aeruginosa NOT DETECTED NOT DETECTED Final   Candida albicans NOT DETECTED NOT DETECTED Final   Candida glabrata NOT DETECTED NOT DETECTED Final   Candida krusei NOT DETECTED NOT DETECTED Final   Candida parapsilosis NOT DETECTED NOT DETECTED Final   Candida tropicalis NOT DETECTED NOT DETECTED Final    Comment: Performed at [facility], [address],  [place] [address]  SARS CORONAVIRUS 2 (TAT 6-24 HRS) Nasopharyngeal Nasopharyngeal Swab     Status: None    Collection Time: 09/25/19  3:41 PM   Specimen: Nasopharyngeal Swab  Result Value Ref Range Status   SARS Coronavirus 2 NEGATIVE NEGATIVE Final    Comment: (NOTE) SARS-CoV-2 target nucleic acids are NOT DETECTED. The SARS-CoV-2 RNA is generally detectable in upper and lower respiratory specimens during the acute phase of infection. Negative results do not preclude SARS-CoV-2 infection, do not rule out co-infections with other pathogens, and should not be used as the sole basis for treatment or other patient management decisions. Negative results must be combined with clinical observations, patient history, and epidemiological information. The expected result is Negative. Fact Sheet for Patients: SugarRoll.be Fact Sheet for Healthcare Providers: https://www.woods-mathews.com/ This test is not yet approved or cleared by the [place] FDA and  has been authorized for detection and/or diagnosis of SARS-CoV-2 by FDA under an Emergency Use Authorization (EUA). This EUA will remain  in effect (meaning this test can be used) for the duration of the COVID-19 declaration under Section 56 4(b)(1) of the Act, 21 U.S.C. section 360bbb-3(b)(1), unless the authorization is terminated or revoked sooner. Performed at [facility], [place] [address], [place],  [address]      Terri Piedra, [place] for [place] [facility] [facility] [phone] Pager  09/26/2019  8:51 AM

## 2019-09-26 NOTE — Evaluation (Signed)
Physical Therapy Evaluation Patient Details Name: Corey Oneill MRN: [id number] DOB: Jun 19, 1936 Today's Date: 09/26/2019   History of Present Illness  Corey Oneill is a 83 y.o. 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 CT head negative for acute abnormality.   Clinical Impression  Pt admitted with above diagnosis. Pt presents with decreased cognition, weakness, balance impairments, left inattention. Pt requiring two person moderate assist for stand pivot transfer. Displays increased difficulty with motor initiation and sequencing during transfer; attempted walker initially but was unsuccessful, therefore guided to chair with handheld assist. Pt currently with functional limitations due to the deficits listed below (see PT Problem List). Pt will benefit from skilled PT to increase their independence and safety with mobility to allow discharge to the venue listed below.       Follow Up Recommendations SNF;Supervision/Assistance - 24 hour    Equipment Recommendations  Wheelchair (measurements PT);Wheelchair cushion (measurements PT)    Recommendations for Other Services       Precautions / Restrictions Precautions Precautions: Fall Restrictions Weight Bearing Restrictions: No      Mobility  Bed Mobility Overal bed mobility: Needs Assistance Bed Mobility: Rolling;Sidelying to Sit Rolling: Mod assist Sidelying to sit: Mod assist       General bed mobility comments: Required increased time and max cues for initiation of rolling as pt stating that he could do it himself, however required assist  to initiate movement with pt then able to complete wtih less assist  Transfers Overall transfer level: Needs assistance Equipment used: 2 person hand held assist;Rolling walker (2 wheeled) Transfers: Sit to/from [facility] Sit to Stand: Min assist;+2 safety/equipment Stand pivot transfers: Mod assist;+2 physical assistance       General transfer comment: Attempted use of RW, however pt unable to manage RW during mobility, therefore utilized 2 person hand held assist during stand pivot transfer.  Due to decreased initiation and requiring increased time to follow one step directions, required total asist for transfer and sit to recliner  Ambulation/Gait                Stairs            Wheelchair Mobility    Modified Rankin (Stroke Patients Only)       Balance Overall balance assessment: Needs assistance Sitting-balance support: Feet supported Sitting balance-Leahy Scale: Fair Sitting balance - Comments: close supervision   Standing balance support: Bilateral upper extremity supported Standing balance-Leahy Scale: Poor Standing balance comment: reliant on external support                             Pertinent Vitals/Pain Pain Assessment: Faces Faces Pain Scale: Hurts little more Pain Location: back Pain Descriptors / Indicators: Grimacing Pain Intervention(s): Monitored during session;Repositioned    Home Living Family/patient expects to be discharged to:: Private residence Living Arrangements: Spouse/significant other Available Help at Discharge: Family;Available 24 hours/day Type of Home: House Home Access: Level entry     Home Layout: One level Home Equipment: Cane - single point;Shower seat;Walker - 2 wheels;Grab bars - tub/shower      Prior Function Level of Independence: Independent with assistive device(s)  Comments: Patient has baseline dementia however wife report he is independent, manages his own  medications, sometimes uses cane for ambulation     Hand Dominance   Dominant Hand: Right    Extremity/Trunk Assessment   Upper Extremity Assessment Upper Extremity Assessment: Defer to OT evaluation    Lower Extremity Assessment Lower Extremity Assessment: Generalized weakness    Cervical / Trunk Assessment Cervical / Trunk Assessment: Kyphotic  Communication   Communication: No difficulties  Cognition Arousal/Alertness: Lethargic Behavior During Therapy: WFL for tasks assessed/performed Overall Cognitive Status: History of cognitive impairments - at baseline                                 General Comments: Pt with baseline dementia.  Decreased initiation and following one step directions      General Comments      Exercises     Assessment/Plan    PT Assessment Patient needs continued PT services  PT Problem List Decreased strength;Decreased activity tolerance;Decreased balance;Decreased mobility;Decreased cognition;Decreased safety awareness       PT Treatment Interventions Gait training;DME instruction;Functional mobility training;Therapeutic activities;Therapeutic exercise;Balance training;Patient/family education    PT Goals (Current goals can be found in the Care Plan section)  Acute Rehab PT Goals Patient Stated Goal: none stated PT Goal Formulation: Patient unable to participate in goal setting Time For Goal Achievement: 10/10/19 Potential to Achieve Goals: Fair    Frequency Min 3X/week   Barriers to discharge        Co-evaluation PT/OT/SLP Co-Evaluation/Treatment: Yes Reason for Co-Treatment: Complexity of the patient's impairments (multi-system involvement);Necessary to address cognition/behavior during functional activity;For patient/therapist safety;To address functional/ADL transfers PT goals addressed during session: Mobility/safety with mobility OT goals addressed during session: ADL's and self-care       AM-PAC PT "6  Clicks" Mobility  Outcome Measure Help needed turning from your back to your side while in a flat bed without using bedrails?: A Lot Help needed moving from lying on your back to sitting on the side of a flat bed without using bedrails?: A Lot Help needed moving to and from a bed to a chair (including a wheelchair)?: A Lot Help needed standing up from a chair using your arms (e.g., wheelchair or bedside chair)?: A Lot Help needed to walk in hospital room?: A Lot Help needed climbing 3-5 steps with a railing? : Total 6 Click Score: 11    End of Session Equipment Utilized During Treatment: Gait belt Activity Tolerance: Patient tolerated treatment well Patient left: in chair;with call bell/phone within reach;with chair alarm set Nurse Communication: Mobility status PT Visit Diagnosis: Unsteadiness on feet (R26.81);Other abnormalities of gait and mobility (R26.89);Muscle weakness (generalized) (M62.81);Difficulty in walking, not elsewhere classified (R26.2)    Time: [id number] PT Time Calculation (min) (ACUTE ONLY): 38 min   Charges:   PT Evaluation $PT Eval Moderate Complexity: 1 Mod PT Treatments $Therapeutic Activity: 8-22 mins        Ellamae Sia, PT, DPT Acute Rehabilitation Services Pager [phone] Office [phone]   Willy Eddy 09/26/2019, 10:39 AM

## 2019-09-26 NOTE — Progress Notes (Signed)
  Echocardiogram 2D Echocardiogram has been performed.  Technically difficult study due to poor patient compliance. Patient small rib spacing, but also arms and hands pushing probe away. Unable to assess apical window.  Corey Oneill A Jayan Raymundo 09/26/2019, 2:57 PM

## 2019-09-26 NOTE — Plan of Care (Signed)

## 2019-09-27 DIAGNOSIS — F341 Dysthymic disorder: Secondary | ICD-10-CM

## 2019-09-27 DIAGNOSIS — E785 Hyperlipidemia, unspecified: Secondary | ICD-10-CM

## 2019-09-27 DIAGNOSIS — D539 Nutritional anemia, unspecified: Secondary | ICD-10-CM

## 2019-09-27 DIAGNOSIS — A419 Sepsis, unspecified organism: Secondary | ICD-10-CM

## 2019-09-27 DIAGNOSIS — N390 Urinary tract infection, site not specified: Secondary | ICD-10-CM

## 2019-09-27 LAB — URINE CULTURE: Culture: 5000 — AB

## 2019-09-27 LAB — CBC WITH DIFFERENTIAL/PLATELET
Abs Immature Granulocytes: 0.02 10*3/uL (ref 0.00–0.07)
Basophils Absolute: 0 10*3/uL (ref 0.0–0.1)
Basophils Relative: 0 %
Eosinophils Absolute: 0 10*3/uL (ref 0.0–0.5)
Eosinophils Relative: 0 %
HCT: 33.1 % — ABNORMAL LOW (ref 39.0–52.0)
Hemoglobin: 11.5 g/dL — ABNORMAL LOW (ref 13.0–17.0)
Immature Granulocytes: 0 %
Lymphocytes Relative: 11 %
Lymphs Abs: 0.5 10*3/uL — ABNORMAL LOW (ref 0.7–4.0)
MCH: 32.8 pg (ref 26.0–34.0)
MCHC: 34.7 g/dL (ref 30.0–36.0)
MCV: 94.3 fL (ref 80.0–100.0)
Monocytes Absolute: 0.6 10*3/uL (ref 0.1–1.0)
Monocytes Relative: 13 %
Neutro Abs: 3.5 10*3/uL (ref 1.7–7.7)
Neutrophils Relative %: 76 %
Platelets: 33 10*3/uL — ABNORMAL LOW (ref 150–400)
RBC: 3.51 MIL/uL — ABNORMAL LOW (ref 4.22–5.81)
RDW: 13.8 % (ref 11.5–15.5)
WBC: 4.7 10*3/uL (ref 4.0–10.5)
nRBC: 0 % (ref 0.0–0.2)

## 2019-09-27 LAB — RENAL FUNCTION PANEL
Albumin: 2.7 g/dL — ABNORMAL LOW (ref 3.5–5.0)
Anion gap: 14 (ref 5–15)
BUN: 12 mg/dL (ref 8–23)
CO2: 22 mmol/L (ref 22–32)
Calcium: 7.5 mg/dL — ABNORMAL LOW (ref 8.9–10.3)
Chloride: 96 mmol/L — ABNORMAL LOW (ref 98–111)
Creatinine, Ser: 0.7 mg/dL (ref 0.61–1.24)
GFR calc Af Amer: >60 mL/min (ref >60)
GFR calc non Af Amer: >60 mL/min (ref >60)
Glucose, Bld: 98 mg/dL (ref 70–99)
Phosphorus: 1.8 mg/dL — ABNORMAL LOW (ref 2.5–4.6)
Potassium: 2.6 mmol/L — CL (ref 3.5–5.1)
Sodium: 132 mmol/L — ABNORMAL LOW (ref 135–145)

## 2019-09-27 LAB — MAGNESIUM: Magnesium: 2.2 mg/dL (ref 1.7–2.4)

## 2019-09-27 MED ORDER — SODIUM PHOSPHATES 45 MMOLE/15ML IV SOLN
10.0000 mmol | Freq: Once | INTRAVENOUS | Status: AC
  Administered 2019-09-27: 13:00:00 10 mmol via INTRAVENOUS
  Filled 2019-09-27 (×2): qty 3.33

## 2019-09-27 MED ORDER — POTASSIUM CHLORIDE 10 MEQ/100ML IV SOLN
10.0000 meq | INTRAVENOUS | Status: AC
  Administered 2019-09-27 (×4): 10 meq via INTRAVENOUS
  Filled 2019-09-27 (×4): qty 100

## 2019-09-27 MED ORDER — POTASSIUM CHLORIDE CRYS ER 20 MEQ PO TBCR
40.0000 meq | EXTENDED_RELEASE_TABLET | Freq: Two times a day (BID) | ORAL | Status: DC
  Administered 2019-09-27 – 2019-09-30 (×7): 40 meq via ORAL
  Filled 2019-09-27 (×7): qty 2

## 2019-09-27 NOTE — Progress Notes (Signed)
Dr. Silas [place] informed of pt's potassium 2.6.

## 2019-09-27 NOTE — Plan of Care (Signed)
  Problem: Education: Goal: Knowledge of General Education information will improve Description Including pain rating scale, medication(s)/side effects and non-pharmacologic comfort measures Outcome: Progressing   Problem: Health Behavior/Discharge Planning: Goal: Ability to manage health-related needs will improve Outcome: Progressing   Problem: Clinical Measurements: Goal: Ability to maintain clinical measurements within normal limits will improve Outcome: Progressing Goal: Will remain free from infection Outcome: Progressing Goal: Diagnostic test results will improve Outcome: Progressing Goal: Respiratory complications will improve Outcome: Progressing Goal: Cardiovascular complication will be avoided Outcome: Progressing   Problem: Activity: Goal: Risk for activity intolerance will decrease Outcome: Progressing   Problem: Nutrition: Goal: Adequate nutrition will be maintained Outcome: Progressing   Problem: Coping: Goal: Level of anxiety will decrease Outcome: Progressing   Problem: Elimination: Goal: Will not experience complications related to bowel motility Outcome: Progressing Goal: Will not experience complications related to urinary retention Outcome: Progressing   Problem: Pain Managment: Goal: General experience of comfort will improve Outcome: Progressing   Problem: Safety: Goal: Ability to remain free from injury will improve Outcome: Progressing   Problem: Skin Integrity: Goal: Risk for impaired skin integrity will decrease Outcome: Progressing   Problem: Urinary Elimination: Goal: Signs and symptoms of infection will decrease Outcome: Progressing   Makhia Vosler, BSN, RN 

## 2019-09-27 NOTE — Plan of Care (Signed)

## 2019-09-27 NOTE — Progress Notes (Addendum)
Corey Oneill Kitchen  PROGRESS NOTE    Corey Oneill  [id number] DOB: Oct 03, 1936 DOA: 09/25/2019 PCP: [name], MD   Brief Narrative:   Corey Oneill a 83 y.o.malewith medical history significant ofsevere aortic stenosis status post TAVR, ITP, prostate cancer status post external beam radiation therapy with metastasis to left iliac bone., radiation prostatitis, hyperlipidemia, depression, GERD, coronary artery disease, chronic diastolic congestive heart failure, bullous pemphigus-on methotrexate, macrocytic anemia, alcohol abusepresented to emergency department with altered mental status.  11/13: Bacteremic. Now on ampicillin, rocephin. Appreciate ID help 11/14: No vegetation seen on TEE.   Assessment & Plan:   Principal Problem:   Altered mental state Active Problems:   Hyperlipidemia   Prostate cancer ([place])   Depression   S/P TAVR (transcatheter aortic valve replacement)   Hx of radiation therapy   Bullous pemphigoid   Idiopathic thrombocytopenic purpura (ITP) (HCC)   Macrocytic anemia   SIRS (systemic inflammatory response syndrome) (HCC)  Altered mental status: Sepsis secondary to Enterococcus UTI/Bacteremia     - Patient presented with tachycardia, tachypnea, fever of 102.2.       - Lactic acid: WNL.  UA negative.  Chest x-ray negative.     - CT head without contrast came back negative for acute findings.     - UCx (E faecalis), Bld Cx (GPC), BCID (enterococcus species)     - ID onboard     - TTE negative for vegetation; ?TEE?, is it going to change Tx?     - Plan is for 6 weeks of dual beta lactam, followed by likely lifelong suppressive amox tx  Dysphagia     - SLP: Rec dys 1 diet w/ thin liquids  Debility     - PT/OT rec SNF     - working on options  Chronic ITP:     - No signs of active bleeding. Monitor  History of prostate cancer with metastasis to bone:     - Status post radiation therapy.  Hypokalemia Hypophosphatemia Hyponatremia     - add K+  and phos     - continue IVF  Macrocytic anemia:     - Likely secondary from methotrexate?     - B12 is low; replace     - H&H, TSH ok     - Folate ok  History of bullous pemphigoid:     - Followed by dermatology outpatient     - Recently started on methotrexate and folic acid; being held.  History of severe aortic stenosis status post TAVR: Aware     - On telemetry.  Hypertension:     - on coreg, imdur at home     - can resume coreg  Alcohol abuse:     - On CIWA protocol.  DVT prophylaxis: SCDs Code Status: DNR Family Communication: With wife by phone   Disposition Plan: TBD  Consultants:   ID  Antimicrobials:  . Ampicillin, rocephin   ROS:  Denies CP, dyspnea, N, V. Reports cough . Remainder 10-pt ROS is negative for all not previously mentioned.  Subjective: "I'm ok. Thanks."  Objective: Vitals:   09/27/19 0429 09/27/19 0510 09/27/19 0735 09/27/19 1134  BP: 134/74 (!) 127/59 128/65 129/65  Pulse: 78 93 70 66  Resp: 20 18 16 16   Temp: 98 F (36.7 C)  97.9 F (36.6 C) 99.2 F (37.3 C)  TempSrc: Oral  Oral Oral  SpO2: 98% 99% 100% 100%  Weight:      Height:  Intake/Output Summary (Last 24 hours) at 09/27/2019 1203 Last data filed at 09/27/2019 0848 Gross per 24 hour  Intake 520 ml  Output 650 ml  Net -130 ml   Filed Weights   09/25/19 1124 09/26/19 0005  Weight: 66.2 kg 63.3 kg    Examination:  General: 83 y.o. male resting in bed in NAD Cardiovascular: RRR, +S1, S2, no m/g/r Respiratory: CTABL, no w/r/r GI: BS+, NDNT, soft MSK: No e/c/c Neuro: Alert to name, follows commands Psyc: Appropriate interaction and affect, calm/cooperative   Data Reviewed: I have personally reviewed following labs and imaging studies.  CBC: Recent Labs  Lab 09/25/19 1000 09/25/19 1425 09/26/19 0309 09/27/19 0524  WBC 4.3  --  6.6 4.7  NEUTROABS 3.6  --   --  3.5  HGB 11.9*  --  12.7* 11.5*  HCT 36.4* 33.6* 36.4* 33.1*  MCV 100.6*  --   96.8 94.3  PLT 37*  --  31* 33*   Basic Metabolic Panel: Recent Labs  Lab 09/25/19 1000 09/25/19 1425 09/26/19 0309 09/27/19 0524  NA 133*  --  130* 132*  K 3.8  --  3.5 2.6*  CL 99  --  99 96*  CO2 23  --  19* 22  GLUCOSE 106*  --  109* 98  BUN 10  --  8 12  CREATININE 0.80  --  0.67 0.70  CALCIUM 8.0*  --  7.6* 7.5*  MG  --  2.0  --  2.2  PHOS  --  1.9*  --  1.8*   GFR: Estimated Creatinine Clearance: 64.8 mL/min (by C-G formula based on SCr of 0.7 mg/dL). Liver Function Tests: Recent Labs  Lab 09/25/19 1000 09/27/19 0524  AST 17  --   ALT 9  --   ALKPHOS 70  --   BILITOT 0.8  --   PROT 6.8  --   ALBUMIN 3.3* 2.7*   No results for input(s): LIPASE, AMYLASE in the last 168 hours. Recent Labs  Lab 09/25/19 1425  AMMONIA 27   Coagulation Profile: Recent Labs  Lab 09/25/19 1000  INR 1.2   Cardiac Enzymes: No results for input(s): CKTOTAL, CKMB, CKMBINDEX, TROPONINI in the last 168 hours. BNP (last 3 results) No results for input(s): PROBNP in the last 8760 hours. HbA1C: No results for input(s): HGBA1C in the last 72 hours. CBG: Recent Labs  Lab 09/25/19 0826  GLUCAP 87   Lipid Profile: No results for input(s): CHOL, HDL, LDLCALC, TRIG, CHOLHDL, LDLDIRECT in the last 72 hours. Thyroid Function Tests: Recent Labs    09/25/19 1425  TSH 1.620   Anemia Panel: Recent Labs    09/25/19 1425  VITAMINB12 142*   Sepsis Labs: Recent Labs  Lab 09/25/19 1000  LATICACIDVEN 1.3    Recent Results (from the past 240 hour(s))  Blood Culture (routine x 2)     Status: None (Preliminary result)   Collection Time: 09/25/19 10:00 AM   Specimen: BLOOD  Result Value Ref Range Status   Specimen Description BLOOD RIGHT ANTECUBITAL  Final   Special Requests   Final    BOTTLES DRAWN AEROBIC AND ANAEROBIC Blood Culture results may not be optimal due to an excessive volume of blood received in culture bottles   Culture  Setup Time   Final    IN BOTH AEROBIC AND  ANAEROBIC BOTTLES GRAM POSITIVE COCCI CRITICAL VALUE NOTED.  VALUE IS CONSISTENT WITH PREVIOUSLY REPORTED AND CALLED VALUE. Performed at [facility], [place]  [address] [place], [place] [address]    Culture GRAM POSITIVE COCCI  Final   Report Status PENDING  Incomplete  Blood Culture (routine x 2)     Status: None (Preliminary result)   Collection Time: 09/25/19 10:06 AM   Specimen: BLOOD  Result Value Ref Range Status   Specimen Description BLOOD BLOOD LEFT WRIST  Final   Special Requests   Final    BOTTLES DRAWN AEROBIC AND ANAEROBIC Blood Culture adequate volume   Culture  Setup Time   Final    IN BOTH AEROBIC AND ANAEROBIC BOTTLES GRAM POSITIVE COCCI CRITICAL RESULT CALLED TO, READ BACK BY AND VERIFIED WITH: PHRMD C PIERCE @0436  09/26/19 BY S GEZAHEGN Performed at [facility], [place] [address], [place], [place] [address]    Culture GRAM POSITIVE COCCI  Final   Report Status PENDING  Incomplete  Blood Culture ID Panel (Reflexed)     Status: Abnormal   Collection Time: 09/25/19 10:06 AM  Result Value Ref Range Status   Enterococcus species DETECTED (A) NOT DETECTED Final    Comment: CRITICAL RESULT CALLED TO, READ BACK BY AND VERIFIED WITH: PHRMD C PIERCE @0436  09/26/19 BY S GEZAHEGN    Vancomycin resistance NOT DETECTED NOT DETECTED Final   Listeria monocytogenes NOT DETECTED NOT DETECTED Final   Staphylococcus species NOT DETECTED NOT DETECTED Final   Staphylococcus aureus (BCID) NOT DETECTED NOT DETECTED Final   Streptococcus species NOT DETECTED NOT DETECTED Final   Streptococcus agalactiae NOT DETECTED NOT DETECTED Final   Streptococcus pneumoniae NOT DETECTED NOT DETECTED Final   Streptococcus pyogenes NOT DETECTED NOT DETECTED Final   Acinetobacter baumannii NOT DETECTED NOT DETECTED Final   Enterobacteriaceae species NOT DETECTED NOT DETECTED Final   Enterobacter cloacae complex NOT DETECTED NOT DETECTED Final   Escherichia coli NOT DETECTED NOT DETECTED Final    Klebsiella oxytoca NOT DETECTED NOT DETECTED Final   Klebsiella pneumoniae NOT DETECTED NOT DETECTED Final   Proteus species NOT DETECTED NOT DETECTED Final   Serratia marcescens NOT DETECTED NOT DETECTED Final   Haemophilus influenzae NOT DETECTED NOT DETECTED Final   Neisseria meningitidis NOT DETECTED NOT DETECTED Final   Pseudomonas aeruginosa NOT DETECTED NOT DETECTED Final   Candida albicans NOT DETECTED NOT DETECTED Final   Candida glabrata NOT DETECTED NOT DETECTED Final   Candida krusei NOT DETECTED NOT DETECTED Final   Candida parapsilosis NOT DETECTED NOT DETECTED Final   Candida tropicalis NOT DETECTED NOT DETECTED Final    Comment: Performed at [facility], [address] [place], [place] [address]  Urine culture     Status: Abnormal   Collection Time: 09/25/19 11:34 AM   Specimen: In/Out Cath Urine  Result Value Ref Range Status   Specimen Description IN/OUT CATH URINE  Final   Special Requests   Final    NONE Performed at [facility], [place] [address] [place], [place] [address]    Culture 5,000 COLONIES/mL ENTEROCOCCUS FAECALIS (A)  Final   Report Status 09/27/2019 FINAL  Final   Organism ID, Bacteria ENTEROCOCCUS FAECALIS (A)  Final      Susceptibility   Enterococcus faecalis - MIC*    AMPICILLIN 8 SENSITIVE Sensitive     LEVOFLOXACIN 2 SENSITIVE Sensitive     NITROFURANTOIN <=16 SENSITIVE Sensitive     VANCOMYCIN 1 SENSITIVE Sensitive     * 5,000 COLONIES/mL ENTEROCOCCUS FAECALIS  SARS CORONAVIRUS 2 (TAT 6-24 HRS) Nasopharyngeal Nasopharyngeal Swab     Status: None  Collection Time: 09/25/19  3:41 PM   Specimen: Nasopharyngeal Swab  Result Value Ref Range Status   SARS Coronavirus 2 NEGATIVE NEGATIVE Final    Comment: (NOTE) SARS-CoV-2 target nucleic acids are NOT DETECTED. The SARS-CoV-2 RNA is generally detectable in upper and lower respiratory specimens during the acute phase of infection. Negative results do not preclude SARS-CoV-2  infection, do not rule out co-infections with other pathogens, and should not be used as the sole basis for treatment or other patient management decisions. Negative results must be combined with clinical observations, patient history, and epidemiological information. The expected result is Negative. Fact Sheet for Patients: SugarRoll.be Fact Sheet for Healthcare Providers: https://www.woods-mathews.com/ This test is not yet approved or cleared by the [place] FDA and  has been authorized for detection and/or diagnosis of SARS-CoV-2 by FDA under an Emergency Use Authorization (EUA). This EUA will remain  in effect (meaning this test can be used) for the duration of the COVID-19 declaration under Section 56 4(b)(1) of the Act, 21 U.S.C. section 360bbb-3(b)(1), unless the authorization is terminated or revoked sooner. Performed at [facility], [place] [address] [place], [place] [address]       Radiology Studies: Dg Swallowing Func-speech Pathology  Result Date: 09/26/2019 Objective Swallowing Evaluation: Type of Study: MBS-Modified Barium Swallow Study  Patient Details Name: GRANTLEY STAN MRN: [id number] Date of Birth: 1935-12-18 Today's Date: 09/26/2019 Time: SLP Start Time (ACUTE ONLY): 1315 -SLP Stop Time (ACUTE ONLY): 1340 SLP Time Calculation (min) (ACUTE ONLY): 25 min Past Medical History: Past Medical History: Diagnosis Date . Abdominal aortic atherosclerosis ([place])  . Anginal pain ([place])   last noted 08/17/19 . Bone cancer ([place])   Left hip . BPPV (benign paroxysmal positional vertigo)  . Bullous pemphigoid  . CAD (coronary artery disease)   a.  LHC 8/16: Mid to distal LAD 30%, OM1 40%, proximal mid RCA 40%, distal RCA 60% >> FFR 0.69  >> PCI: 3 x 15 mm Resolute DES . Depression  . Esophageal reflux  . Family history of adverse reaction to anesthesia   "daughter has PONV" . Grade I diastolic dysfunction [id number]  Noted on ECHO  . Grade I  diastolic dysfunction [id number]  Noted on ECHO . H/O blood clots   "get them in my stool and urine" (11/12/2015) . Heart murmur  . Hepatic lesion 12/08/2015  Stable 8 mm right hepatic lesion . History of aortic stenosis   a. peak to peak gradient by LHC 8/16:  37 mmHg (moderately severe)  . History of appendicitis 2016 . History of blood transfusion 12/2018 . History of herpes labialis  . History of ITP   2018, thrombocytopenia . History of kidney stones  . Hx of radiation therapy 04/14/13-06/09/13  prostate 7800 cGy, 40 sessions, seminal vesicles 5600 cGy 40 sessions . Hydrocele 2006  Small . Hyperlipidemia  . Insomnia   resolved . LVH (left ventricular hypertrophy)   Moderate, noted on ECHO 09/2018 . Malignant melanoma in situ ([place]) 09/04/2016  Right neck and chest . Melanoma ([place])   pt denies . Metastasis from malignant neoplasm of prostate ([place]) 02/25/2019 . Prostate cancer ([place]) dx'd 2014 . Radiation proctitis  . Renal mass 2017  Bilateral renal masses . S/P skin biopsy 04/09/2019  subepidermal cell poor vesicle , Linear IGG and C3 the basement membrane . Suprapubic catheter ([place])  . Wears hearing aid in both ears  Past Surgical History: Past Surgical History: Procedure Laterality Date . CARDIAC CATHETERIZATION N/A 04/21/2015  Procedure: Right/Left Heart Cath and Coronary Angiography;  Surgeon: [name], MD;  Location: [facility];  Service: Cardiovascular;  Laterality: N/A; . CARDIAC CATHETERIZATION N/A 06/18/2015  Procedure: Intravascular Pressure Wire/FFR Study;  Surgeon: [name], MD;  Location: [facility];  Service: Cardiovascular;  Laterality: N/A; . CARDIAC CATHETERIZATION N/A 06/18/2015  Procedure: Coronary Stent Intervention;  Surgeon: [name], MD;  Location: [facility];  Service: Cardiovascular;  Laterality: N/A; . CARDIAC CATHETERIZATION N/A 06/18/2015  Procedure: Right/Left Heart Cath and Coronary Angiography;  Surgeon: [name], MD;  Location: [facility];   Service: Cardiovascular;  Laterality: N/A; . CARDIAC CATHETERIZATION N/A 06/23/2015  Procedure: Left Heart Cath and Cors/Grafts Angiography;  Surgeon: [name], MD;  Location: [facility];  Service: Cardiovascular;  Laterality: N/A; . CARDIAC CATHETERIZATION N/A 01/17/2016  Procedure: Right/Left Heart Cath and Coronary Angiography;  Surgeon: [name], MD;  Location: [facility];  Service: Cardiovascular;  Laterality: N/A; . CATARACT EXTRACTION, BILATERAL   . COLONOSCOPY  06/26/2017 . CYSTOSCOPY WITH BIOPSY N/A 07/30/2018  Procedure: CYSTOSCOPY WITH BIOPSY/FULGURATION, CYSTOLTHOLAPAXY;  Surgeon: [name], MD;  Location[facility];  Service: Urology;  Laterality: N/A; . CYSTOSCOPY WITH URETHRAL DILATATION N/A 08/18/2019  Procedure: CYSTOSCOPY WITH URETHRAL DILATATION USING BALLOON OR LASER/ SPURO PUBIC TUBE CHANGE LASER EXCISION OF URETHRAL STRICTURE RETROGRADE URETHROGRAM ANTEGRADE CYSTOGRAM;  Surgeon: [name], MD;  Location: [facility];  Service: Urology;  Laterality: N/A; . FRACTURE SURGERY   . INGUINAL HERNIA REPAIR    patient does not remember this procedure . LAPAROSCOPIC APPENDECTOMY N/A 11/16/2015  Procedure: APPENDECTOMY LAPAROSCOPIC;  Surgeon: [name], MD;  Location: [facility];  Service: General;  Laterality: N/A; . LEFT HEART CATH AND CORONARY ANGIOGRAPHY N/A 12/26/2016  Procedure: Left Heart Cath and Coronary Angiography;  Surgeon: Troy Sine, MD;  Location: [facility];  Service: Cardiovascular;  Laterality: N/A; . MELANOMA EXCISION Right 10/24/2016  Procedure: WIDE EXCISION MELANOMA RIGHT NECK AND RIGHT CHEST TIMES 2;  Surgeon: [name], MD;  Location: [facility];  Service: General;  Laterality: Right;  right medial and lateral lesion of chest and right neck . NASAL FRACTURE SURGERY    "broken years ago; several ORs to correct it" . NASAL SEPTUM SURGERY   . PROSTATE BIOPSY  2014  "needle biopsy" . TEE WITHOUT  CARDIOVERSION N/A 02/15/2016  Procedure: TRANSESOPHAGEAL ECHOCARDIOGRAM (TEE);  Surgeon: [name], MD;  Location: [facility];  Service: Open Heart Surgery;  Laterality: N/A; . TEE WITHOUT CARDIOVERSION N/A 08/26/2019  Procedure: TRANSESOPHAGEAL ECHOCARDIOGRAM (TEE);  Surgeon: Pixie Casino, MD;  Location: [facility];  Service: Cardiovascular;  Laterality: N/A; . TONSILLECTOMY   . TRANSCATHETER AORTIC VALVE REPLACEMENT, TRANSFEMORAL  02/15/2016 . TRANSCATHETER AORTIC VALVE REPLACEMENT, TRANSFEMORAL N/A 02/15/2016  Procedure: TRANSCATHETER AORTIC VALVE REPLACEMENT, TRANSFEMORAL;  Surgeon: [name], MD;  Location: [facility];  Service: Open Heart Surgery;  Laterality: N/A; . TRANSURETHRAL RESECTION OF PROSTATE  12/23/2018  Procedure: CYSTOSCOPY WITH  BLADDER BIOPSY WITH FULGERATION/ TRANSURETHRAL RESECTION PROSTATE  AND PROSTATE BIOPSY;  Surgeon: [name], MD;  Location: [facility];  Service: Urology;; HPI: 83 y.o. male with medical history significant of severe aortic stenosis status post TAVR, ITP, prostate cancer status post external beam radiation therapy with metastasis to left iliac bone., radiation prostatitis, hyperlipidemia, depression, GERD, coronary artery disease, chronic diastolic congestive heart failure, bullous pemphigus-on methotrexate, macrocytic anemia, alcohol abuse presented to emergency department with altered  mental status. CT head negative for acute abnormality.   Subjective: alert, confused Assessment / Plan / Recommendation CHL IP CLINICAL IMPRESSIONS 09/26/2019 Clinical Impression Pt presents with a moderate oropharyngeal dysphagia marked by impaired bolus control/cohesion, decreased propulsion of all consistencies through pharynx with poor pharyngeal squeeze and inconsistent epiglottic inversion.  These deficits led to diffuse barium residue in the valleculae and pyriforms.  Residue accumulated throughout test, leading to eventual spillage into larynx, reaching the vocal folds.  There  was no observed aspiration, but pt is at high risk given this decreased overall function. After returning to the room, pt's wife was able to view the video and we discussed his current swallowing status, including aspiration risk and its relationship to overall mentation and dementia.  Mrs. [place] did not want to pursue temporary NG feeds at this time. We agreed the better option may be to start a modified diet of dysphagia 1, thin liquids; provide careful supervision with meals, and hold tray if pt begins coughing.  As mentation improves, we would anticipate improved swallowing, at least to baseline. D/W RN. SLP will follow for safety/diet progression.  SLP Visit Diagnosis Dysphagia, oropharyngeal phase (R13.12) Attention and concentration deficit following -- Frontal lobe and executive function deficit following -- Impact on safety and function Moderate aspiration risk   CHL IP TREATMENT RECOMMENDATION 09/26/2019 Treatment Recommendations Therapy as outlined in treatment plan below   Prognosis 09/26/2019 Prognosis for Safe Diet Advancement Fair Barriers to Reach Goals Cognitive deficits Barriers/Prognosis Comment -- CHL IP DIET RECOMMENDATION 09/26/2019 SLP Diet Recommendations Dysphagia 1 (Puree) solids;Thin liquid Liquid Administration via Cup;Straw Medication Administration Crushed with puree Compensations Minimize environmental distractions Postural Changes Seated upright at 90 degrees   CHL IP OTHER RECOMMENDATIONS 09/26/2019 Recommended Consults -- Oral Care Recommendations Oral care BID Other Recommendations --   CHL IP FOLLOW UP RECOMMENDATIONS 09/26/2019 Follow up Recommendations Other (comment)   CHL IP FREQUENCY AND DURATION 09/26/2019 Speech Therapy Frequency (ACUTE ONLY) min 2x/week Treatment Duration 2 weeks      CHL IP ORAL PHASE 09/26/2019 Oral Phase Impaired Oral - Pudding Teaspoon -- Oral - Pudding Cup -- Oral - Honey Teaspoon -- Oral - Honey Cup -- Oral - Nectar Teaspoon -- Oral - Nectar Cup --  Oral - Nectar Straw -- Oral - Thin Teaspoon -- Oral - Thin Cup -- Oral - Thin Straw Decreased bolus cohesion Oral - Puree Weak lingual manipulation;Delayed oral transit;Decreased bolus cohesion Oral - Mech Soft Weak lingual manipulation;Decreased bolus cohesion;Delayed oral transit Oral - Regular -- Oral - Multi-Consistency -- Oral - Pill -- Oral Phase - Comment --  CHL IP PHARYNGEAL PHASE 09/26/2019 Pharyngeal Phase Impaired Pharyngeal- Pudding Teaspoon -- Pharyngeal -- Pharyngeal- Pudding Cup -- Pharyngeal -- Pharyngeal- Honey Teaspoon -- Pharyngeal -- Pharyngeal- Honey Cup -- Pharyngeal -- Pharyngeal- Nectar Teaspoon -- Pharyngeal -- Pharyngeal- Nectar Cup -- Pharyngeal -- Pharyngeal- Nectar Straw -- Pharyngeal -- Pharyngeal- Thin Teaspoon -- Pharyngeal -- Pharyngeal- Thin Cup -- Pharyngeal -- Pharyngeal- Thin Straw Delayed swallow initiation-pyriform sinuses;Reduced pharyngeal peristalsis;Reduced epiglottic inversion;Reduced tongue base retraction;Reduced airway/laryngeal closure;Penetration/Apiration after swallow;Pharyngeal residue - valleculae;Pharyngeal residue - pyriform Pharyngeal Material enters airway, CONTACTS cords and not ejected out Pharyngeal- Puree Reduced pharyngeal peristalsis;Reduced epiglottic inversion;Reduced tongue base retraction;Pharyngeal residue - valleculae;Pharyngeal residue - pyriform;Delayed swallow initiation-vallecula Pharyngeal Material does not enter airway Pharyngeal- Mechanical Soft Reduced pharyngeal peristalsis;Reduced epiglottic inversion;Reduced tongue base retraction;Pharyngeal residue - valleculae;Pharyngeal residue - pyriform;Delayed swallow initiation-vallecula Pharyngeal Material does not enter airway Pharyngeal- Regular -- Pharyngeal -- Pharyngeal- Multi-consistency --  Pharyngeal -- Pharyngeal- Pill -- Pharyngeal -- Pharyngeal Comment --  CHL IP CERVICAL ESOPHAGEAL PHASE 09/26/2019 Cervical Esophageal Phase (No Data) Pudding Teaspoon -- Pudding Cup -- Honey Teaspoon  -- Honey Cup -- Nectar Teaspoon -- Nectar Cup -- Nectar Straw -- Thin Teaspoon -- Thin Cup -- Thin Straw -- Puree -- Mechanical Soft -- Regular -- Multi-consistency -- Pill -- Cervical Esophageal Comment -- Juan Quam Laurice 09/26/2019, 4:15 PM                Scheduled Meds: . Chlorhexidine Gluconate Cloth  6 each Topical Daily  . potassium chloride  40 mEq Oral BID  . vitamin B-12  500 mcg Oral Daily   Continuous Infusions: . sodium chloride Stopped (09/25/19 2306)  . ampicillin (OMNIPEN) IV 2 g (09/27/19 1123)  . cefTRIAXone (ROCEPHIN)  IV 2 g (09/27/19 1025)  . potassium chloride 10 mEq (09/27/19 1121)  . sodium phosphate  Dextrose 5% IVPB       LOS: 2 days    Time spent: 25 minutes spent in the coordination of care today.    Jonnie Finner, DO [facility] Pager [phone]  If 7PM-7AM, please contact night-coverage [email or web address] Password [email or web address] 09/27/2019, 12:03 PM

## 2019-09-27 NOTE — Progress Notes (Signed)
Per telemetry pt noted to have 3 beats VT. Dr. Silas [place] informed.

## 2019-09-27 NOTE — Progress Notes (Signed)
  Speech Language Pathology Treatment: Dysphagia  Patient Details Name: Corey Oneill MRN: [id number] DOB: [birth year] Today's Date: 09/27/2019 Time: 1100-1117 SLP Time Calculation (min) (ACUTE ONLY): 17 min  Assessment / Plan / Recommendation Clinical Impression  Skilled treatment with intake of POs including: puree and thin via cup without overt s/s of aspiration noted during trial; nursing and MD stated he had frequent coughing with breakfast meal despite 75% intake; discussed need for full supervision during meals with mod verbal/visual cues required for small sips d/t decreased mentation/cog deficits.  Reviewed MBS results and risk for aspiration with upgraded diet, so wife/SLP agreed to continue conservative diet of puree/thin to reduce risk with potential for upgrade when mentation progresses to baseline; discussed importance of full supervision/implementation of swallowing precautions with nursing during meals/snacks.  ST will continue to f/u for diet tolerance and education re: aspiration potential/swallowing safety while in acute setting.  HPI HPI: 83 y.o. 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 CT head negative for acute abnormality.        SLP Plan  Continue with current plan of care       Recommendations  Diet recommendations: Dysphagia 1 (puree);Thin liquid Liquids provided via: Cup Medication Administration: Crushed with puree Supervision: Staff to assist with self feeding;Full supervision/cueing for compensatory strategies Compensations: Minimize environmental distractions               Oral Care Recommendations: Oral care QID Follow up Recommendations: Other (comment) SLP Visit Diagnosis: Dysphagia, oropharyngeal phase (R13.12) Plan: Continue with current plan of care                       Elvina Sidle, M.S., CCC-SLP 09/27/2019, 12:46 PM

## 2019-09-28 LAB — CBC WITH DIFFERENTIAL/PLATELET
Abs Immature Granulocytes: 0.01 10*3/uL (ref 0.00–0.07)
Basophils Absolute: 0 10*3/uL (ref 0.0–0.1)
Basophils Relative: 0 %
Eosinophils Absolute: 0.1 10*3/uL (ref 0.0–0.5)
Eosinophils Relative: 3 %
HCT: 30.9 % — ABNORMAL LOW (ref 39.0–52.0)
Hemoglobin: 10.7 g/dL — ABNORMAL LOW (ref 13.0–17.0)
Immature Granulocytes: 0 %
Lymphocytes Relative: 20 %
Lymphs Abs: 0.6 10*3/uL — ABNORMAL LOW (ref 0.7–4.0)
MCH: 33.2 pg (ref 26.0–34.0)
MCHC: 34.6 g/dL (ref 30.0–36.0)
MCV: 96 fL (ref 80.0–100.0)
Monocytes Absolute: 0.7 10*3/uL (ref 0.1–1.0)
Monocytes Relative: 22 %
Neutro Abs: 1.7 10*3/uL (ref 1.7–7.7)
Neutrophils Relative %: 55 %
Platelets: 50 10*3/uL — ABNORMAL LOW (ref 150–400)
RBC: 3.22 MIL/uL — ABNORMAL LOW (ref 4.22–5.81)
RDW: 13.7 % (ref 11.5–15.5)
WBC: 3.1 10*3/uL — ABNORMAL LOW (ref 4.0–10.5)
nRBC: 0 % (ref 0.0–0.2)

## 2019-09-28 LAB — RENAL FUNCTION PANEL
Albumin: 2.5 g/dL — ABNORMAL LOW (ref 3.5–5.0)
Anion gap: 11 (ref 5–15)
BUN: 10 mg/dL (ref 8–23)
CO2: 24 mmol/L (ref 22–32)
Calcium: 7.8 mg/dL — ABNORMAL LOW (ref 8.9–10.3)
Chloride: 103 mmol/L (ref 98–111)
Creatinine, Ser: 0.67 mg/dL (ref 0.61–1.24)
GFR calc Af Amer: >60 mL/min (ref >60)
GFR calc non Af Amer: >60 mL/min (ref >60)
Glucose, Bld: 103 mg/dL — ABNORMAL HIGH (ref 70–99)
Phosphorus: 1.8 mg/dL — ABNORMAL LOW (ref 2.5–4.6)
Potassium: 3.4 mmol/L — ABNORMAL LOW (ref 3.5–5.1)
Sodium: 138 mmol/L (ref 135–145)

## 2019-09-28 LAB — CULTURE, BLOOD (ROUTINE X 2): Special Requests: ADEQUATE

## 2019-09-28 LAB — MAGNESIUM: Magnesium: 2.4 mg/dL (ref 1.7–2.4)

## 2019-09-28 MED ORDER — SODIUM CHLORIDE 0.9 % IV SOLN
INTRAVENOUS | Status: DC | PRN
  Administered 2019-09-28: 250 mL via INTRAVENOUS

## 2019-09-28 MED ORDER — SODIUM PHOSPHATES 45 MMOLE/15ML IV SOLN
10.0000 mmol | Freq: Once | INTRAVENOUS | Status: AC
  Administered 2019-09-28: 10 mmol via INTRAVENOUS
  Filled 2019-09-28: qty 3.33

## 2019-09-28 NOTE — Progress Notes (Signed)
Potassium 3.4, Dr, Silas [place] informed

## 2019-09-28 NOTE — Progress Notes (Signed)
  Speech Language Pathology Treatment: Dysphagia  Patient Details Name: Corey Oneill MRN: [id number] DOB: [birth year] Today's Date: 09/28/2019 Time: [id number] SLP Time Calculation (min) (ACUTE ONLY): 26 min  Assessment / Plan / Recommendation Clinical Impression  Patient seen at end of breakfast meal. RN fed pt breakfast and reports he had no incidence of throat clearing or coughing during meal. He tolerated medication crushed in applesauce. Pt is very alert this morning. 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 Observed pt with crackers and coffee. Mastication and bolus control were good, he automatically initiated double swallow with each small bolus solid. Pt likely has better awareness and sensation of pharyngeal residue. No wet vocal quality, no throat clear or coughing noted. Review of aspiration risk with pt and need for following aspiration precautions. Pt is expressing wanting better choices and to not have a pureed diet. Recommend Dys 3, thin liquid diet. RN notified and will provide continued supervision with meals to ensure safety. Speech therapy to follow up for diet tolerance, continue aspiration education.    HPI HPI: 83 y.o. 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 CT head negative for acute abnormality.        SLP Plan  Continue with current plan of care       Recommendations  Diet recommendations: Dysphagia 3 (mechanical soft);Thin liquid Liquids provided via: Cup Medication Administration: Crushed with puree Supervision:  Staff to assist with self feeding;Full supervision/cueing for compensatory strategies Compensations: Minimize environmental distractions;Slow rate;Small sips/bites Postural Changes and/or Swallow Maneuvers: Seated upright 90 degrees                Oral Care Recommendations: Oral care QID SLP Visit Diagnosis: Dysphagia, oropharyngeal phase (R13.12) Plan: Continue with current plan of care       [place], MA, CCC-SLP 09/28/2019, 10:08 AM

## 2019-09-28 NOTE — Progress Notes (Signed)
[name]  PROGRESS NOTE    Corey Oneill  [id number] DOB: 08-Aug-1936 DOA: 09/25/2019 PCP: [name], MD   Brief Narrative:   Corey Oneill a 83 y.o.malewith medical history significant ofsevere aortic stenosis status post TAVR, ITP, prostate cancer status post external beam radiation therapy with metastasis to left iliac bone., radiation prostatitis, hyperlipidemia, depression, GERD, coronary artery disease, chronic diastolic congestive heart failure, bullous pemphigus-on methotrexate, macrocytic anemia, alcohol abusepresented to emergency department with altered mental status.  11/13: Bacteremic. Now on ampicillin, rocephin. Appreciate ID help 11/14: No vegetation seen on TEE. 11/15: Bright, conversant this AM. Denies complaints.    Assessment & Plan:   Principal Problem:   Altered mental state Active Problems:   Hyperlipidemia   Prostate cancer ([place])   Depression   S/P TAVR (transcatheter aortic valve replacement)   Hx of radiation therapy   Bullous pemphigoid   Idiopathic thrombocytopenic purpura (ITP) (HCC)   Macrocytic anemia   SIRS (systemic inflammatory response syndrome) (HCC)  Altered mental status: Sepsis secondary to Enterococcus UTI/Bacteremia - Patient presented with tachycardia, tachypnea, fever of 102.2.  - Lactic acid: WNL. UA negative. Chest x-ray negative. - CT head without contrast came back negative for acute findings. - UCx (E faecalis), Bld Cx (GPC), BCID (enterococcus species) - ID onboard - TTE negative for vegetation; ?TEE?, is it going to change Tx? Will defer to ID - Plan is for 6 weeks of dual beta lactam, followed by likely lifelong suppressive amox tx  Dysphagia - SLP recs:         - Diet recommendations: Dysphagia 3 (mechanical soft);Thin liquid          - Liquids provided via: Cup         - Medication Administration: Crushed with puree         - Supervision: Staff to assist with self  feeding;Full supervision/cueing for compensatory strategies         - Compensations: Minimize environmental distractions;Slow rate;Small sips/bites         - Postural Changes and/or Swallow Maneuvers: Seated upright 90 degrees  Debility - PT/OT rec SNF - working on options  Chronic ITP: - No signs of active bleeding. Monitor     - plt 50 today  History of prostate cancer with metastasis to bone: - Status post radiation therapy.  Hypokalemia Hypophosphatemia Hyponatremia - continue K+ and phos - d/c IVF  Macrocytic anemia: - Likely secondary from methotrexate? - B12 is low; replace - H&H, TSH ok - Folate ok  History of bullous pemphigoid: - Followed by dermatology outpatient - Recently started on methotrexate and folic acid; being held.  History of severe aortic stenosis status post TAVR: Aware - On telemetry.  Hypertension: - on coreg, imdur at home; holding  Alcohol abuse: - On CIWA protocol.  DVT prophylaxis: SCDs Code Status: DNR   Disposition Plan: TBD  Consultants:   ID  Antimicrobials:  . Ampicillin, rocephin   ROS:  Denies ab pain, dyspnea, CP . Remainder 10-pt ROS is negative for all not previously mentioned.  Subjective: "When do you think I will get to go?"  Objective: Vitals:   09/28/19 0006 09/28/19 0433 09/28/19 0911 09/28/19 1155  BP: (!) 142/64 132/64 (!) 104/50 124/86  Pulse: 78 73 70 77  Resp: 18 16 16 16   Temp: 98.3 F (36.8 C) 98.5 F (36.9 C) 97.6 F (36.4 C) 97.9 F (36.6 C)  TempSrc:  Oral Oral Oral  SpO2: 100% 100% 100% 99%  Weight:      Height:        Intake/Output Summary (Last 24 hours) at 09/28/2019 1224 Last data filed at 09/28/2019 0300 Gross per 24 hour  Intake 540 ml  Output 300 ml  Net 240 ml   Filed Weights   09/25/19 1124 09/26/19 0005  Weight: 66.2 kg 63.3 kg    Examination:  General: 83 y.o. male resting in bed in NAD  Cardiovascular: RRR, +S1, S2, no m/g/r, equal pulses throughout Respiratory: CTABL, no w/r/r, normal WOB GI: BS+, NDNT, no masses noted, no organomegaly noted MSK: No e/c/c Neuro: Alert to name, follows commands Psyc: calm/cooperative   Data Reviewed: I have personally reviewed following labs and imaging studies.  CBC: Recent Labs  Lab 09/25/19 1000 09/25/19 1425 09/26/19 0309 09/27/19 0524 09/28/19 0402  WBC 4.3  --  6.6 4.7 3.1*  NEUTROABS 3.6  --   --  3.5 1.7  HGB 11.9*  --  12.7* 11.5* 10.7*  HCT 36.4* 33.6* 36.4* 33.1* 30.9*  MCV 100.6*  --  96.8 94.3 96.0  PLT 37*  --  31* 33* 50*   Basic Metabolic Panel: Recent Labs  Lab 09/25/19 1000 09/25/19 1425 09/26/19 0309 09/27/19 0524 09/28/19 0402  NA 133*  --  130* 132* 138  K 3.8  --  3.5 2.6* 3.4*  CL 99  --  99 96* 103  CO2 23  --  19* 22 24  GLUCOSE 106*  --  109* 98 103*  BUN 10  --  8 12 10   CREATININE 0.80  --  0.67 0.70 0.67  CALCIUM 8.0*  --  7.6* 7.5* 7.8*  MG  --  2.0  --  2.2 2.4  PHOS  --  1.9*  --  1.8* 1.8*   GFR: Estimated Creatinine Clearance: 64.8 mL/min (by C-G formula based on SCr of 0.67 mg/dL). Liver Function Tests: Recent Labs  Lab 09/25/19 1000 09/27/19 0524 09/28/19 0402  AST 17  --   --   ALT 9  --   --   ALKPHOS 70  --   --   BILITOT 0.8  --   --   PROT 6.8  --   --   ALBUMIN 3.3* 2.7* 2.5*   No results for input(s): LIPASE, AMYLASE in the last 168 hours. Recent Labs  Lab 09/25/19 1425  AMMONIA 27   Coagulation Profile: Recent Labs  Lab 09/25/19 1000  INR 1.2   Cardiac Enzymes: No results for input(s): CKTOTAL, CKMB, CKMBINDEX, TROPONINI in the last 168 hours. BNP (last 3 results) No results for input(s): PROBNP in the last 8760 hours. HbA1C: No results for input(s): HGBA1C in the last 72 hours. CBG: Recent Labs  Lab 09/25/19 0826  GLUCAP 87   Lipid Profile: No results for input(s): CHOL, HDL, LDLCALC, TRIG, CHOLHDL, LDLDIRECT in the last 72 hours.  Thyroid Function Tests: Recent Labs    09/25/19 1425  TSH 1.620   Anemia Panel: Recent Labs    09/25/19 1425  VITAMINB12 142*   Sepsis Labs: Recent Labs  Lab 09/25/19 1000  LATICACIDVEN 1.3    Recent Results (from the past 240 hour(s))  Blood Culture (routine x 2)     Status: Abnormal   Collection Time: 09/25/19 10:00 AM   Specimen: BLOOD  Result Value Ref Range Status   Specimen Description BLOOD RIGHT ANTECUBITAL  Final   Special Requests   Final    BOTTLES DRAWN AEROBIC AND ANAEROBIC Blood Culture results may  not be optimal due to an excessive volume of blood received in culture bottles   Culture  Setup Time   Final    IN BOTH AEROBIC AND ANAEROBIC BOTTLES GRAM POSITIVE COCCI CRITICAL VALUE NOTED.  VALUE IS CONSISTENT WITH PREVIOUSLY REPORTED AND CALLED VALUE. Performed at [facility], [place] [address], [place], [place] [address]    Culture ENTEROCOCCUS FAECALIS (A)  Final   Report Status 09/28/2019 FINAL  Final   Organism ID, Bacteria ENTEROCOCCUS FAECALIS  Final      Susceptibility   Enterococcus faecalis - MIC*    AMPICILLIN <=2 SENSITIVE Sensitive     VANCOMYCIN 1 SENSITIVE Sensitive     GENTAMICIN SYNERGY SENSITIVE Sensitive     * ENTEROCOCCUS FAECALIS  Blood Culture (routine x 2)     Status: Abnormal   Collection Time: 09/25/19 10:06 AM   Specimen: BLOOD  Result Value Ref Range Status   Specimen Description BLOOD BLOOD LEFT WRIST  Final   Special Requests   Final    BOTTLES DRAWN AEROBIC AND ANAEROBIC Blood Culture adequate volume   Culture  Setup Time   Final    IN BOTH AEROBIC AND ANAEROBIC BOTTLES GRAM POSITIVE COCCI CRITICAL RESULT CALLED TO, READ BACK BY AND VERIFIED WITH: PHRMD C PIERCE @0436  09/26/19 BY S GEZAHEGN    Culture (A)  Final    ENTEROCOCCUS FAECALIS SUSCEPTIBILITIES PERFORMED ON PREVIOUS CULTURE WITHIN THE LAST 5 DAYS. Performed at [facility], [place] [address] [place], [place] [address]    Report Status 09/28/2019 FINAL   Final  Blood Culture ID Panel (Reflexed)     Status: Abnormal   Collection Time: 09/25/19 10:06 AM  Result Value Ref Range Status   Enterococcus species DETECTED (A) NOT DETECTED Final    Comment: CRITICAL RESULT CALLED TO, READ BACK BY AND VERIFIED WITH: PHRMD C PIERCE @0436  09/26/19 BY S GEZAHEGN    Vancomycin resistance NOT DETECTED NOT DETECTED Final   Listeria monocytogenes NOT DETECTED NOT DETECTED Final   Staphylococcus species NOT DETECTED NOT DETECTED Final   Staphylococcus aureus (BCID) NOT DETECTED NOT DETECTED Final   Streptococcus species NOT DETECTED NOT DETECTED Final   Streptococcus agalactiae NOT DETECTED NOT DETECTED Final   Streptococcus pneumoniae NOT DETECTED NOT DETECTED Final   Streptococcus pyogenes NOT DETECTED NOT DETECTED Final   Acinetobacter baumannii NOT DETECTED NOT DETECTED Final   Enterobacteriaceae species NOT DETECTED NOT DETECTED Final   Enterobacter cloacae complex NOT DETECTED NOT DETECTED Final   Escherichia coli NOT DETECTED NOT DETECTED Final   Klebsiella oxytoca NOT DETECTED NOT DETECTED Final   Klebsiella pneumoniae NOT DETECTED NOT DETECTED Final   Proteus species NOT DETECTED NOT DETECTED Final   Serratia marcescens NOT DETECTED NOT DETECTED Final   Haemophilus influenzae NOT DETECTED NOT DETECTED Final   Neisseria meningitidis NOT DETECTED NOT DETECTED Final   Pseudomonas aeruginosa NOT DETECTED NOT DETECTED Final   Candida albicans NOT DETECTED NOT DETECTED Final   Candida glabrata NOT DETECTED NOT DETECTED Final   Candida krusei NOT DETECTED NOT DETECTED Final   Candida parapsilosis NOT DETECTED NOT DETECTED Final   Candida tropicalis NOT DETECTED NOT DETECTED Final    Comment: Performed at [facility], [address] [place], [place] [address]  Urine culture     Status: Abnormal   Collection Time: 09/25/19 11:34 AM   Specimen: In/Out Cath Urine  Result Value Ref Range Status   Specimen Description IN/OUT CATH URINE  Final  Special Requests   Final    NONE Performed at [facility], [place] [address] [place], [place] [address]    Culture 5,000 COLONIES/mL ENTEROCOCCUS FAECALIS (A)  Final   Report Status 09/27/2019 FINAL  Final   Organism ID, Bacteria ENTEROCOCCUS FAECALIS (A)  Final      Susceptibility   Enterococcus faecalis - MIC*    AMPICILLIN 8 SENSITIVE Sensitive     LEVOFLOXACIN 2 SENSITIVE Sensitive     NITROFURANTOIN <=16 SENSITIVE Sensitive     VANCOMYCIN 1 SENSITIVE Sensitive     * 5,000 COLONIES/mL ENTEROCOCCUS FAECALIS  SARS CORONAVIRUS 2 (TAT 6-24 HRS) Nasopharyngeal Nasopharyngeal Swab     Status: None   Collection Time: 09/25/19  3:41 PM   Specimen: Nasopharyngeal Swab  Result Value Ref Range Status   SARS Coronavirus 2 NEGATIVE NEGATIVE Final    Comment: (NOTE) SARS-CoV-2 target nucleic acids are NOT DETECTED. The SARS-CoV-2 RNA is generally detectable in upper and lower respiratory specimens during the acute phase of infection. Negative results do not preclude SARS-CoV-2 infection, do not rule out co-infections with other pathogens, and should not be used as the sole basis for treatment or other patient management decisions. Negative results must be combined with clinical observations, patient history, and epidemiological information. The expected result is Negative. Fact Sheet for Patients: SugarRoll.be Fact Sheet for Healthcare Providers: https://www.woods-mathews.com/ This test is not yet approved or cleared by the [place] FDA and  has been authorized for detection and/or diagnosis of SARS-CoV-2 by FDA under an Emergency Use Authorization (EUA). This EUA will remain  in effect (meaning this test can be used) for the duration of the COVID-19 declaration under Section 56 4(b)(1) of the Act, 21 U.S.C. section 360bbb-3(b)(1), unless the authorization is terminated or revoked sooner. Performed at [facility], [place]  [address], [place], [place] [address]   Culture, blood (routine x 2)     Status: None (Preliminary result)   Collection Time: 09/27/19  5:17 AM   Specimen: BLOOD  Result Value Ref Range Status   Specimen Description BLOOD RIGHT ANTECUBITAL  Final   Special Requests   Final    BOTTLES DRAWN AEROBIC AND ANAEROBIC Blood Culture adequate volume   Culture   Final    NO GROWTH < 12 HOURS Performed at [facility], [place] [address], [place], [place] [address]    Report Status PENDING  Incomplete  Culture, blood (routine x 2)     Status: None (Preliminary result)   Collection Time: 09/27/19  5:20 AM   Specimen: BLOOD RIGHT HAND  Result Value Ref Range Status   Specimen Description BLOOD RIGHT HAND  Final   Special Requests AEROBIC BOTTLE ONLY Blood Culture adequate volume  Final   Culture   Final    NO GROWTH < 12 HOURS Performed at [facility], [place] [address] [place], [place] [address]    Report Status PENDING  Incomplete      Radiology Studies: Dg Swallowing Func-speech Pathology  Result Date: 09/26/2019 Objective Swallowing Evaluation: Type of Study: MBS-Modified Barium Swallow Study  Patient Details Name: JUAREZ SPAZIANI MRN: [id number] Date of Birth: Dec 06, 1935 Today's Date: 09/26/2019 Time: SLP Start Time (ACUTE ONLY): 1315 -SLP Stop Time (ACUTE ONLY): 1340 SLP Time Calculation (min) (ACUTE ONLY): 25 min Past Medical History: Past Medical History: Diagnosis Date . Abdominal aortic atherosclerosis ([place])  . Anginal pain ([place])   last noted 08/17/19 . Bone cancer ([place])   Left  hip . BPPV (benign paroxysmal positional vertigo)  . Bullous pemphigoid  . CAD (coronary artery disease)   a.  LHC 8/16: Mid to distal LAD 30%, OM1 40%, proximal mid RCA 40%, distal RCA 60% >> FFR 0.69  >> PCI: 3 x 15 mm Resolute DES . Depression  . Esophageal reflux  . Family history of adverse reaction to anesthesia   "daughter has PONV" . Grade I diastolic dysfunction [id number]  Noted on ECHO  . Grade I diastolic  dysfunction [id number]  Noted on ECHO . H/O blood clots   "get them in my stool and urine" (11/12/2015) . Heart murmur  . Hepatic lesion 12/08/2015  Stable 8 mm right hepatic lesion . History of aortic stenosis   a. peak to peak gradient by LHC 8/16:  37 mmHg (moderately severe)  . History of appendicitis 2016 . History of blood transfusion 12/2018 . History of herpes labialis  . History of ITP   2018, thrombocytopenia . History of kidney stones  . Hx of radiation therapy 04/14/13-06/09/13  prostate 7800 cGy, 40 sessions, seminal vesicles 5600 cGy 40 sessions . Hydrocele 2006  Small . Hyperlipidemia  . Insomnia   resolved . LVH (left ventricular hypertrophy)   Moderate, noted on ECHO 09/2018 . Malignant melanoma in situ ([place]) 09/04/2016  Right neck and chest . Melanoma ([place])   pt denies . Metastasis from malignant neoplasm of prostate ([place]) 02/25/2019 . Prostate cancer ([place]) dx'd 2014 . Radiation proctitis  . Renal mass 2017  Bilateral renal masses . S/P skin biopsy 04/09/2019  subepidermal cell poor vesicle , Linear IGG and C3 the basement membrane . Suprapubic catheter ([place])  . Wears hearing aid in both ears  Past Surgical History: Past Surgical History: Procedure Laterality Date . CARDIAC CATHETERIZATION N/A 04/21/2015  Procedure: Right/Left Heart Cath and Coronary Angiography;  Surgeon: [name], MD;  Location: [facility];  Service: Cardiovascular;  Laterality: N/A; . CARDIAC CATHETERIZATION N/A 06/18/2015  Procedure: Intravascular Pressure Wire/FFR Study;  Surgeon: [name], MD;  Location: [place];  Service: Cardiovascular;  Laterality: N/A; . CARDIAC CATHETERIZATION N/A 06/18/2015  Procedure: Coronary Stent Intervention;  Surgeon: [name], MD;  Location: [facility];  Service: Cardiovascular;  Laterality: N/A; . CARDIAC CATHETERIZATION N/A 06/18/2015  Procedure: Right/Left Heart Cath and Coronary Angiography;  Surgeon: [name], MD;  Location: [facility];  Service:  Cardiovascular;  Laterality: N/A; . CARDIAC CATHETERIZATION N/A 06/23/2015  Procedure: Left Heart Cath and Cors/Grafts Angiography;  Surgeon: [name], MD;  Location: [facility];  Service: Cardiovascular;  Laterality: N/A; . CARDIAC CATHETERIZATION N/A 01/17/2016  Procedure: Right/Left Heart Cath and Coronary Angiography;  Surgeon: [name], MD;  Location: [facility];  Service: Cardiovascular;  Laterality: N/A; . CATARACT EXTRACTION, BILATERAL   . COLONOSCOPY  06/26/2017 . CYSTOSCOPY WITH BIOPSY N/A 07/30/2018  Procedure: CYSTOSCOPY WITH BIOPSY/FULGURATION, CYSTOLTHOLAPAXY;  Surgeon: [name], MD;  Location: [facility];  Service: Urology;  Laterality: N/A; . CYSTOSCOPY WITH URETHRAL DILATATION N/A 08/18/2019  Procedure: CYSTOSCOPY WITH URETHRAL DILATATION USING BALLOON OR LASER/ SPURO PUBIC TUBE CHANGE LASER EXCISION OF URETHRAL STRICTURE RETROGRADE URETHROGRAM ANTEGRADE CYSTOGRAM;  Surgeon: [name], MD;  Location: [facility];  Service: Urology;  Laterality: N/A; . FRACTURE SURGERY   . INGUINAL HERNIA REPAIR    patient does not remember this procedure . LAPAROSCOPIC APPENDECTOMY N/A 11/16/2015  Procedure: APPENDECTOMY LAPAROSCOPIC;  Surgeon: [name], MD;  Location: [facility]  OR;  Service: General;  Laterality: N/A; . LEFT HEART CATH AND CORONARY ANGIOGRAPHY N/A 12/26/2016  Procedure: Left Heart Cath and Coronary Angiography;  Surgeon: Troy Sine, MD;  Location: [place] [facility];  Service: Cardiovascular;  Laterality: N/A; . MELANOMA EXCISION Right 10/24/2016  Procedure: WIDE EXCISION MELANOMA RIGHT NECK AND RIGHT CHEST TIMES 2;  Surgeon: [name], MD;  Location: [facility];  Service: General;  Laterality: Right;  right medial and lateral lesion of chest and right neck . NASAL FRACTURE SURGERY    "broken years ago; several ORs to correct it" . NASAL SEPTUM SURGERY   . PROSTATE BIOPSY  2014  "needle biopsy" . TEE WITHOUT CARDIOVERSION N/A  02/15/2016  Procedure: TRANSESOPHAGEAL ECHOCARDIOGRAM (TEE);  Surgeon: [name], MD;  Location: [facility];  Service: Open Heart Surgery;  Laterality: N/A; . TEE WITHOUT CARDIOVERSION N/A 08/26/2019  Procedure: TRANSESOPHAGEAL ECHOCARDIOGRAM (TEE);  Surgeon: Pixie Casino, MD;  Location: [facility];  Service: Cardiovascular;  Laterality: N/A; . TONSILLECTOMY   . TRANSCATHETER AORTIC VALVE REPLACEMENT, TRANSFEMORAL  02/15/2016 . TRANSCATHETER AORTIC VALVE REPLACEMENT, TRANSFEMORAL N/A 02/15/2016  Procedure: TRANSCATHETER AORTIC VALVE REPLACEMENT, TRANSFEMORAL;  Surgeon: [name], MD;  Location: [facility];  Service: Open Heart Surgery;  Laterality: N/A; . TRANSURETHRAL RESECTION OF PROSTATE  12/23/2018  Procedure: CYSTOSCOPY WITH  BLADDER BIOPSY WITH FULGERATION/ TRANSURETHRAL RESECTION PROSTATE  AND PROSTATE BIOPSY;  Surgeon: [name], MD;  Location: [facility];  Service: Urology;; HPI: 83 y.o. 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 CT head negative for acute abnormality.   Subjective: alert, confused Assessment / Plan / Recommendation CHL IP CLINICAL IMPRESSIONS 09/26/2019 Clinical Impression Pt presents with a moderate oropharyngeal dysphagia marked by impaired bolus control/cohesion, decreased propulsion of all consistencies through pharynx with poor pharyngeal squeeze and inconsistent epiglottic inversion.  These deficits led to diffuse barium residue in the valleculae and pyriforms.  Residue accumulated throughout test, leading to eventual spillage into larynx, reaching the vocal folds.  There was no observed  aspiration, but pt is at high risk given this decreased overall function. After returning to the room, pt's wife was able to view the video and we discussed his current swallowing status, including aspiration risk and its relationship to overall mentation and dementia.  Mrs. Antonopoulos did not want to pursue temporary NG feeds at this time. We agreed the better option may be to start a modified diet of dysphagia 1, thin liquids; provide careful supervision with meals, and hold tray if pt begins coughing.  As mentation improves, we would anticipate improved swallowing, at least to baseline. D/W RN. SLP will follow for safety/diet progression.  SLP Visit Diagnosis Dysphagia, oropharyngeal phase (R13.12) Attention and concentration deficit following -- Frontal lobe and executive function deficit following -- Impact on safety and function Moderate aspiration risk   CHL IP TREATMENT RECOMMENDATION 09/26/2019 Treatment Recommendations Therapy as outlined in treatment plan below   Prognosis 09/26/2019 Prognosis for Safe Diet Advancement Fair Barriers to Reach Goals Cognitive deficits Barriers/Prognosis Comment -- CHL IP DIET RECOMMENDATION 09/26/2019 SLP Diet Recommendations Dysphagia 1 (Puree) solids;Thin liquid Liquid Administration via Cup;Straw Medication Administration Crushed with puree Compensations Minimize environmental distractions Postural Changes Seated upright at 90 degrees  CHL IP OTHER RECOMMENDATIONS 09/26/2019 Recommended Consults -- Oral Care Recommendations Oral care BID Other Recommendations --   CHL IP FOLLOW UP RECOMMENDATIONS 09/26/2019 Follow up Recommendations Other (comment)   CHL IP FREQUENCY AND DURATION 09/26/2019 Speech Therapy Frequency (ACUTE ONLY) min 2x/week Treatment Duration 2 weeks      CHL IP ORAL PHASE 09/26/2019 Oral Phase Impaired Oral - Pudding Teaspoon -- Oral - Pudding Cup -- Oral - Honey Teaspoon -- Oral - Honey Cup -- Oral - Nectar Teaspoon -- Oral - Nectar Cup -- Oral - Nectar  Straw -- Oral - Thin Teaspoon -- Oral - Thin Cup -- Oral - Thin Straw Decreased bolus cohesion Oral - Puree Weak lingual manipulation;Delayed oral transit;Decreased bolus cohesion Oral - Mech Soft Weak lingual manipulation;Decreased bolus cohesion;Delayed oral transit Oral - Regular -- Oral - Multi-Consistency -- Oral - Pill -- Oral Phase - Comment --  CHL IP PHARYNGEAL PHASE 09/26/2019 Pharyngeal Phase Impaired Pharyngeal- Pudding Teaspoon -- Pharyngeal -- Pharyngeal- Pudding Cup -- Pharyngeal -- Pharyngeal- Honey Teaspoon -- Pharyngeal -- Pharyngeal- Honey Cup -- Pharyngeal -- Pharyngeal- Nectar Teaspoon -- Pharyngeal -- Pharyngeal- Nectar Cup -- Pharyngeal -- Pharyngeal- Nectar Straw -- Pharyngeal -- Pharyngeal- Thin Teaspoon -- Pharyngeal -- Pharyngeal- Thin Cup -- Pharyngeal -- Pharyngeal- Thin Straw Delayed swallow initiation-pyriform sinuses;Reduced pharyngeal peristalsis;Reduced epiglottic inversion;Reduced tongue base retraction;Reduced airway/laryngeal closure;Penetration/Apiration after swallow;Pharyngeal residue - valleculae;Pharyngeal residue - pyriform Pharyngeal Material enters airway, CONTACTS cords and not ejected out Pharyngeal- Puree Reduced pharyngeal peristalsis;Reduced epiglottic inversion;Reduced tongue base retraction;Pharyngeal residue - valleculae;Pharyngeal residue - pyriform;Delayed swallow initiation-vallecula Pharyngeal Material does not enter airway Pharyngeal- Mechanical Soft Reduced pharyngeal peristalsis;Reduced epiglottic inversion;Reduced tongue base retraction;Pharyngeal residue - valleculae;Pharyngeal residue - pyriform;Delayed swallow initiation-vallecula Pharyngeal Material does not enter airway Pharyngeal- Regular -- Pharyngeal -- Pharyngeal- Multi-consistency -- Pharyngeal -- Pharyngeal- Pill -- Pharyngeal -- Pharyngeal Comment --  CHL IP CERVICAL ESOPHAGEAL PHASE 09/26/2019 Cervical Esophageal Phase (No Data) Pudding Teaspoon -- Pudding Cup -- Honey Teaspoon -- Honey Cup --  Nectar Teaspoon -- Nectar Cup -- Nectar Straw -- Thin Teaspoon -- Thin Cup -- Thin Straw -- Puree -- Mechanical Soft -- Regular -- Multi-consistency -- Pill -- Cervical Esophageal Comment -- Juan Quam Laurice 09/26/2019, 4:15 PM                Scheduled Meds: . Chlorhexidine Gluconate Cloth  6 each Topical Daily  . potassium chloride  40 mEq Oral BID  . vitamin B-12  500 mcg Oral Daily   Continuous Infusions: . ampicillin (OMNIPEN) IV 2 g (09/28/19 0853)  . cefTRIAXone (ROCEPHIN)  IV 2 g (09/28/19 0816)  . sodium phosphate  Dextrose 5% IVPB 10 mmol (09/28/19 0919)     LOS: 3 days    Time spent: 25 minutes spent in the coordination of care today.    Jonnie Finner, DO [facility] Pager [phone]  If 7PM-7AM, please contact night-coverage [email or web address] Password [facility] 09/28/2019, 12:24 PM

## 2019-09-28 NOTE — TOC Progression Note (Signed)
Transition of Care [facility]) - Progression Note    Patient Details  Name: KAYZEN MARTIRE MRN: [id number] Date of Birth: 18-Feb-1936  Transition of Care [facility] CM/SW [place], [place] Phone Number: 09/28/2019, 3:35 PM  Clinical Narrative:     CSW provided bed offer to the patient and his wife. She stated she is only familiar with [facility] and [facility]. She was not happy with just one bed offer. CSW stated that since it is the weekend not a lot of admissions staff are in the office. CSW stated once admissions come in on Monday morning, they will review referrals that came in Friday and over the weekend. She requested that she be notified of additional bed offers.   CSW will continue to follow and assist as needed.   Expected Discharge Plan: [place] Barriers to Discharge: Continued Medical Work up  Expected Discharge Plan and Services Expected Discharge Plan: [place] Choice: [place] arrangements for the past 2 months: Single Family Home                                       Social Determinants of Health (SDOH) Interventions    Readmission Risk Interventions No flowsheet data found.

## 2019-09-29 ENCOUNTER — Inpatient Hospital Stay: Payer: Self-pay

## 2019-09-29 ENCOUNTER — Telehealth: Payer: Self-pay | Admitting: Family Medicine

## 2019-09-29 ENCOUNTER — Telehealth: Payer: Self-pay

## 2019-09-29 LAB — RENAL FUNCTION PANEL
Albumin: 2.3 g/dL — ABNORMAL LOW (ref 3.5–5.0)
Anion gap: 12 (ref 5–15)
BUN: 11 mg/dL (ref 8–23)
CO2: 24 mmol/L (ref 22–32)
Calcium: 7.9 mg/dL — ABNORMAL LOW (ref 8.9–10.3)
Chloride: 101 mmol/L (ref 98–111)
Creatinine, Ser: 0.68 mg/dL (ref 0.61–1.24)
GFR calc Af Amer: >60 mL/min (ref >60)
GFR calc non Af Amer: >60 mL/min (ref >60)
Glucose, Bld: 103 mg/dL — ABNORMAL HIGH (ref 70–99)
Phosphorus: 3.2 mg/dL (ref 2.5–4.6)
Potassium: 4.1 mmol/L (ref 3.5–5.1)
Sodium: 137 mmol/L (ref 135–145)

## 2019-09-29 LAB — MAGNESIUM: Magnesium: 2.2 mg/dL (ref 1.7–2.4)

## 2019-09-29 LAB — SARS CORONAVIRUS 2 (TAT 6-24 HRS): SARS Coronavirus 2: NEGATIVE

## 2019-09-29 MED ORDER — SODIUM CHLORIDE 0.9% FLUSH: 10.0000 mL | INTRAVENOUS | Status: DC | PRN

## 2019-09-29 MED ORDER — SODIUM CHLORIDE 0.9% FLUSH
10.0000 mL | Freq: Two times a day (BID) | INTRAVENOUS | Status: DC
  Administered 2019-09-29 – 2019-09-30 (×2): 10 mL

## 2019-09-29 NOTE — Progress Notes (Signed)
[place] for Infectious Disease  Date of Admission:  09/25/2019     Total days of antibiotics 4         ASSESSMENT:  Corey Oneill continues to clinically improve and repeat cultures have remained without growth to date. Does not appear to have need for TEE as it will not change plan of care and due to difficulties during previous TEE attempts.  Discussed plan of care to include PICC line and 6 weeks of IV antibiotic therapy with ceftriaxone and ampicillin. 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 Will need to long term suppression following acute treatment.  PLAN:  1. Continue current dose of ceftriaxone and ampicillin through 11/08/19.  2. Will order double lumen PICC line  3. OPAT orders placed 4. Follow up in [facility] in 3-4 weeks.   Diagnosis: Enterococcus faecalis bacteremia / endocarditis Culture Result: Enterococcus faecalis  Allergies  Allergen Reactions  . Prednisone Other (See Comments)    Makes depressed    OPAT Orders Discharge antibiotics: Ampicillin / ceftriaxone  Per pharmacy protocol  Duration: 6 weeks  End Date: 11/08/2019  [facility] Care Per Protocol:  Labs weekly while on IV antibiotics: _X_ CBC with differential __ BMP _X_ CMP __ CRP __ ESR __ Vancomycin trough __ CK  __ Please pull PIC at completion of IV antibiotics _X_ Please leave PIC in place until doctor has seen patient or been notified  Fax weekly labs to [phone]  Clinic Follow Up Appt:  10/27/19 at 9:30am with Dr. Linus Salmons   Principal Problem:   Altered mental state Active Problems:   Hyperlipidemia   Prostate cancer ([place])   Depression   S/P TAVR (transcatheter aortic valve replacement)   Hx of radiation therapy   Bullous pemphigoid   Idiopathic thrombocytopenic purpura (ITP) (HCC)   Macrocytic anemia   SIRS (systemic inflammatory response syndrome) ([place])   . Chlorhexidine Gluconate Cloth  6 each Topical Daily  . potassium  chloride  40 mEq Oral BID  . vitamin B-12  500 mcg Oral Daily    SUBJECTIVE:  Afebrile overnight with no acute events. Feeling better today.   Allergies  Allergen Reactions  . Prednisone Other (See Comments)    Makes depressed     Review of Systems: Review of Systems  Constitutional: Negative for chills, fever and weight loss.  Respiratory: Negative for cough, shortness of breath and wheezing.   Cardiovascular: Negative for chest pain and leg swelling.  Gastrointestinal: Negative for abdominal pain, constipation, diarrhea, nausea and vomiting.  Skin: Negative for rash.      OBJECTIVE: Vitals:   09/28/19 2353 09/29/19 0338 09/29/19 0823 09/29/19 1139  BP: 128/86 (!) 159/80 134/77 (!) 136/59  Pulse: 77 77 81 72  Resp: 14 11    Temp: 98.3 F (36.8 C) 98.6 F (37 C) 97.8 F (36.6 C) 98.2 F (36.8 C)  TempSrc: Oral Oral Oral Oral  SpO2: 99% 99% 100% 98%  Weight:      Height:       Body mass index is 20.02 kg/m.  Physical Exam Constitutional:      General: He is not in acute distress.    Appearance: He is well-developed.     Comments: Lying in bed with head of bed elevated; pleasant.   Cardiovascular:     Rate and Rhythm: Normal rate and regular rhythm.     Heart sounds: Normal heart sounds.  Pulmonary:     Effort: Pulmonary effort is normal.  Breath sounds: Normal breath sounds.  Skin:    General: Skin is warm and dry.  Neurological:     Mental Status: He is alert and oriented to person, place, and time.  Psychiatric:        Behavior: Behavior normal.        Thought Content: Thought content normal.        Judgment: Judgment normal.     Lab Results Lab Results  Component Value Date   WBC 3.1 (L) 09/28/2019   HGB 10.7 (L) 09/28/2019   HCT 30.9 (L) 09/28/2019   MCV 96.0 09/28/2019   PLT 50 (L) 09/28/2019    Lab Results  Component Value Date   CREATININE 0.67 09/28/2019   BUN 10 09/28/2019   NA 138 09/28/2019   K 3.4 (L) 09/28/2019   CL 103  09/28/2019   CO2 24 09/28/2019    Lab Results  Component Value Date   ALT 9 09/25/2019   AST 17 09/25/2019   ALKPHOS 70 09/25/2019   BILITOT 0.8 09/25/2019     Microbiology: Recent Results (from the past 240 hour(s))  Blood Culture (routine x 2)     Status: Abnormal   Collection Time: 09/25/19 10:00 AM   Specimen: BLOOD  Result Value Ref Range Status   Specimen Description BLOOD RIGHT ANTECUBITAL  Final   Special Requests   Final    BOTTLES DRAWN AEROBIC AND ANAEROBIC Blood Culture results may not be optimal due to an excessive volume of blood received in culture bottles   Culture  Setup Time   Final    IN BOTH AEROBIC AND ANAEROBIC BOTTLES GRAM POSITIVE COCCI CRITICAL VALUE NOTED.  VALUE IS CONSISTENT WITH PREVIOUSLY REPORTED AND CALLED VALUE. Performed at [facility], [place] [address] [place], [place] [address]    Culture ENTEROCOCCUS FAECALIS (A)  Final   Report Status 09/28/2019 FINAL  Final   Organism ID, Bacteria ENTEROCOCCUS FAECALIS  Final      Susceptibility   Enterococcus faecalis - MIC*    AMPICILLIN <=2 SENSITIVE Sensitive     VANCOMYCIN 1 SENSITIVE Sensitive     GENTAMICIN SYNERGY SENSITIVE Sensitive     * ENTEROCOCCUS FAECALIS  Blood Culture (routine x 2)     Status: Abnormal   Collection Time: 09/25/19 10:06 AM   Specimen: BLOOD  Result Value Ref Range Status   Specimen Description BLOOD BLOOD LEFT WRIST  Final   Special Requests   Final    BOTTLES DRAWN AEROBIC AND ANAEROBIC Blood Culture adequate volume   Culture  Setup Time   Final    IN BOTH AEROBIC AND ANAEROBIC BOTTLES GRAM POSITIVE COCCI CRITICAL RESULT CALLED TO, READ BACK BY AND VERIFIED WITH: PHRMD C PIERCE _0  09/26/19 BY S GEZAHEGN    Culture (A)  Final    ENTEROCOCCUS FAECALIS SUSCEPTIBILITIES PERFORMED ON PREVIOUS CULTURE WITHIN THE LAST 5 DAYS. Performed at [facility], [place] [address] [place], [place] [address]    Report Status 09/28/2019 FINAL  Final  Blood Culture ID  Panel (Reflexed)     Status: Abnormal   Collection Time: 09/25/19 10:06 AM  Result Value Ref Range Status   Enterococcus species DETECTED (A) NOT DETECTED Final    Comment: CRITICAL RESULT CALLED TO, READ BACK BY AND VERIFIED WITH: PHRMD C PIERCE _1  09/26/19 BY S GEZAHEGN    Vancomycin resistance NOT DETECTED NOT DETECTED Final   Listeria monocytogenes NOT DETECTED NOT DETECTED Final   Staphylococcus species NOT DETECTED  NOT DETECTED Final   Staphylococcus aureus (BCID) NOT DETECTED NOT DETECTED Final   Streptococcus species NOT DETECTED NOT DETECTED Final   Streptococcus agalactiae NOT DETECTED NOT DETECTED Final   Streptococcus pneumoniae NOT DETECTED NOT DETECTED Final   Streptococcus pyogenes NOT DETECTED NOT DETECTED Final   Acinetobacter baumannii NOT DETECTED NOT DETECTED Final   Enterobacteriaceae species NOT DETECTED NOT DETECTED Final   Enterobacter cloacae complex NOT DETECTED NOT DETECTED Final   Escherichia coli NOT DETECTED NOT DETECTED Final   Klebsiella oxytoca NOT DETECTED NOT DETECTED Final   Klebsiella pneumoniae NOT DETECTED NOT DETECTED Final   Proteus species NOT DETECTED NOT DETECTED Final   Serratia marcescens NOT DETECTED NOT DETECTED Final   Haemophilus influenzae NOT DETECTED NOT DETECTED Final   Neisseria meningitidis NOT DETECTED NOT DETECTED Final   Pseudomonas aeruginosa NOT DETECTED NOT DETECTED Final   Candida albicans NOT DETECTED NOT DETECTED Final   Candida glabrata NOT DETECTED NOT DETECTED Final   Candida krusei NOT DETECTED NOT DETECTED Final   Candida parapsilosis NOT DETECTED NOT DETECTED Final   Candida tropicalis NOT DETECTED NOT DETECTED Final    Comment: Performed at [facility], [address] [place], [place] [address]  Urine culture     Status: Abnormal   Collection Time: 09/25/19 11:34 AM   Specimen: In/Out Cath Urine  Result Value Ref Range Status   Specimen Description IN/OUT CATH URINE  Final   Special Requests    Final    NONE Performed at [facility], [place] [address] [place], [place] [address]    Culture 5,000 COLONIES/mL ENTEROCOCCUS FAECALIS (A)  Final   Report Status 09/27/2019 FINAL  Final   Organism ID, Bacteria ENTEROCOCCUS FAECALIS (A)  Final      Susceptibility   Enterococcus faecalis - MIC*    AMPICILLIN 8 SENSITIVE Sensitive     LEVOFLOXACIN 2 SENSITIVE Sensitive     NITROFURANTOIN <=16 SENSITIVE Sensitive     VANCOMYCIN 1 SENSITIVE Sensitive     * 5,000 COLONIES/mL ENTEROCOCCUS FAECALIS  SARS CORONAVIRUS 2 (TAT 6-24 HRS) Nasopharyngeal Nasopharyngeal Swab     Status: None   Collection Time: 09/25/19  3:41 PM   Specimen: Nasopharyngeal Swab  Result Value Ref Range Status   SARS Coronavirus 2 NEGATIVE NEGATIVE Final    Comment: (NOTE) SARS-CoV-2 target nucleic acids are NOT DETECTED. The SARS-CoV-2 RNA is generally detectable in upper and lower respiratory specimens during the acute phase of infection. Negative results do not preclude SARS-CoV-2 infection, do not rule out co-infections with other pathogens, and should not be used as the sole basis for treatment or other patient management decisions. Negative results must be combined with clinical observations, patient history, and epidemiological information. The expected result is Negative. Fact Sheet for Patients: SugarRoll.be Fact Sheet for Healthcare Providers: https://www.woods-mathews.com/ This test is not yet approved or cleared by the [place] FDA and  has been authorized for detection and/or diagnosis of SARS-CoV-2 by FDA under an Emergency Use Authorization (EUA). This EUA will remain  in effect (meaning this test can be used) for the duration of the COVID-19 declaration under Section 56 4(b)(1) of the Act, 21 U.S.C. section 360bbb-3(b)(1), unless the authorization is terminated or revoked sooner. Performed at [facility], [place] [address] [place],  [place] [address]   Culture, blood (routine x 2)     Status: None (Preliminary result)   Collection Time: 09/27/19  5:17 AM   Specimen: BLOOD  Result Value Ref Range Status   Specimen Description BLOOD RIGHT ANTECUBITAL  Final   Special Requests   Final    BOTTLES DRAWN AEROBIC AND ANAEROBIC Blood Culture adequate volume   Culture   Final    NO GROWTH 2 DAYS Performed at [facility], [address] [place], [place] [address]    Report Status PENDING  Incomplete  Culture, blood (routine x 2)     Status: None (Preliminary result)   Collection Time: 09/27/19  5:20 AM   Specimen: BLOOD RIGHT HAND  Result Value Ref Range Status   Specimen Description BLOOD RIGHT HAND  Final   Special Requests AEROBIC BOTTLE ONLY Blood Culture adequate volume  Final   Culture   Final    NO GROWTH 2 DAYS Performed at [facility], [place] [address] [place], [place] [address]    Report Status PENDING  Incomplete     Corey Oneill, [place] [facility] [phone] Pager  09/29/2019  12:15 PM

## 2019-09-29 NOTE — Care Management Important Message (Signed)
Important Message  Patient Details  Name: Corey Oneill MRN: [id number] Date of Birth: 1936/03/13   Medicare Important Message Given:  Yes     Orbie Pyo 09/29/2019, 3:06 PM

## 2019-09-29 NOTE — Telephone Encounter (Signed)
Patients wife is calling for update of his admission. She says she spoke with someone at the hospital and was told Dr Linus Salmons was in charge of his care.   Pt has dementia .  She would like you to call if possible.  She is at work but will try to answer the phone. If not please leave a detailed message.   [id number] Mardene Sayer, RN

## 2019-09-29 NOTE — Progress Notes (Signed)
Physical Therapy Treatment Patient Details Name: Corey Oneill MRN: [id number] DOB: 1936-10-01 Today's Date: 09/29/2019    History of Present Illness Corey Oneill is a 83 y.o. 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 CT head negative for acute abnormality.     PT Comments    Pt is making good progress with functional mobility as she was able to ambulate increased distances with RW & min assist. Pt is limited by impaired cognition & decreased safety awareness, as he requires assistance for proper & safe use of RW during gait and will require 24 hr supervision upon d/c. Will continue to follow acutely to focus on gait, transfers, balance, safety awareness & increasing independence with functional mobility.     Follow Up Recommendations  SNF;Supervision/Assistance - 24 hour(may can progress to home with HHPT if family can provide 24 hr assistance)     Equipment Recommendations  Wheelchair (measurements PT);Wheelchair cushion (measurements PT);Rolling walker with 5" wheels    Recommendations for Other Services       Precautions / Restrictions Precautions Precautions: Fall Restrictions Weight Bearing Restrictions: No    Mobility  Bed Mobility Overal bed mobility: Needs Assistance Bed Mobility: Supine to Sit     Supine to sit: Supervision;HOB elevated(bed rails)     General bed mobility comments: cuing to scoot to EOB  Transfers Overall transfer level: Needs assistance Equipment used: Rolling walker (2 wheeled) Transfers: Sit to/from [facility] Sit to Stand: Min  assist Stand pivot transfers: Min assist       General transfer comment: Pt requires cuing for hand placement and min assist for sit<>stand with RW. Pt able to pivot to sit in recliner beside bed.  Ambulation/Gait Ambulation/Gait assistance: Min assist Gait Distance (Feet): 85 Feet Assistive device: Rolling walker (2 wheeled) Gait Pattern/deviations: (forward trunk lean, inconsistent step length & width with tendency to have narrow BOS)     General Gait Details: Pt ambulates to door & back to recliner, then out into hallway & back into room. Pt with very poor awareness & ability to manage RW, requiring max cuing to ambulate within base of AD with poor demo & pt frequently bumping objects in room, requiring assistance to steer AD.   Stairs             Wheelchair Mobility    Modified Rankin (Stroke Patients Only)       Balance Overall balance assessment: Needs assistance Sitting-balance support: Feet supported;Bilateral upper extremity supported Sitting balance-Leahy Scale: Fair Sitting balance - Comments: close supervision static sitting balance EOB   Standing balance support: Bilateral upper extremity supported Standing balance-Leahy Scale: Poor Standing balance comment: BUE on RW & min assist for standing/gait                            Cognition Arousal/Alertness: Awake/alert Behavior During Therapy: WFL for tasks assessed/performed Overall Cognitive Status: History of cognitive impairments - at baseline                                 General Comments: Pt with baseline dementia per chart. Pt able  to follow commands with extra time. Pt oriented x 4.      Exercises      General Comments General comments (skin integrity, edema, etc.): Pt oriented x 4 but poor awareness re: safety & AD management. Very pleased with ambulating longer distances today. Pleasant gentleman.      Pertinent Vitals/Pain Pain Assessment: No/denies pain     Home Living                      Prior Function            PT Goals (current goals can now be found in the care plan section) Acute Rehab PT Goals Patient Stated Goal: none stated Time For Goal Achievement: 10/10/19 Potential to Achieve Goals: Fair Progress towards PT goals: PT to reassess next treatment    Frequency    Min 3X/week      PT Plan Current plan remains appropriate    Co-evaluation              AM-PAC PT "6 Clicks" Mobility   Outcome Measure  Help needed turning from your back to your side while in a flat bed without using bedrails?: None Help needed moving from lying on your back to sitting on the side of a flat bed without using bedrails?: None Help needed moving to and from a bed to a chair (including a wheelchair)?: A Little Help needed standing up from a chair using your arms (e.g., wheelchair or bedside chair)?: A Little Help needed to walk in hospital room?: A Little Help needed climbing 3-5 steps with a railing? : A Lot 6 Click Score: 19    End of Session Equipment Utilized During Treatment: Gait belt Activity Tolerance: Patient tolerated treatment well Patient left: in chair;with chair alarm set;with call bell/phone within reach(reviewed use of call bell & need to call for assistance when wanting to transfer out of recliner) Nurse Communication: (IV beeping, blood noted to 5th digit of R toe (pt reports old injury that's already been made aware)) PT Visit Diagnosis: Unsteadiness on feet (R26.81);Other abnormalities of gait and mobility (R26.89);Muscle weakness (generalized) (M62.81);Difficulty in walking, not elsewhere classified (R26.2)     Time: [id number] PT Time Calculation (min) (ACUTE ONLY): 25 min  Charges:  $Gait Training: 8-22 mins $Therapeutic Activity: 8-22 mins                         Waunita Schooner, PT, DPT 09/29/2019, 2:31 PM

## 2019-09-29 NOTE — Telephone Encounter (Signed)
Pts wife called and wanted to make sure you were aware that he is back in the hospital and the issues that he is having. Pt is having memory issues and will may need to go to a rehab facility.

## 2019-09-29 NOTE — Progress Notes (Signed)
Peripherally Inserted Central Catheter/Midline Placement  The IV Nurse has discussed with the patient and/or persons authorized to consent for the patient, the purpose of this procedure and the potential benefits and risks involved with this procedure.  The benefits include less needle sticks, lab draws from the catheter, and the patient may be discharged home with the catheter. Risks include, but not limited to, infection, bleeding, blood clot (thrombus formation), and puncture of an artery; nerve damage and irregular heartbeat and possibility to perform a PICC exchange if needed/ordered by physician.  Alternatives to this procedure were also discussed.  Bard Power PICC patient education guide, fact sheet on infection prevention and patient information card has been provided to patient /or left at bedside.    PICC/Midline Placement Documentation  PICC Single Lumen [id number] PICC Right Basilic 41 cm 0 cm (Active)     PICC Double Lumen [id number] PICC Left Basilic 47 cm 0 cm (Active)  Indication for Insertion or Continuance of Line Home intravenous therapies (PICC only) 09/29/19 2159  Exposed Catheter (cm) 0 cm 09/29/19 2159  Site Assessment Clean;Dry;Intact 09/29/19 2159  Lumen #1 Status Flushed;Saline locked;Blood return noted 09/29/19 2159  Lumen #2 Status Flushed;Saline locked;Blood return noted 09/29/19 2159  Dressing Type Transparent 09/29/19 2159  Dressing Status Clean;Dry;Intact;Antimicrobial disc in place 09/29/19 2159  Dressing Change Due 10/06/19 09/29/19 2159       Gordan Payment 09/29/2019, 10:00 PM

## 2019-09-29 NOTE — Progress Notes (Signed)
PHARMACY CONSULT NOTE FOR:  OUTPATIENT  PARENTERAL ANTIBIOTIC THERAPY (OPAT)  Indication: Enterococcal bacteremia w/ concern for seeding of TAVR  Regimen: Ceftriaxone 2 gm every 12 hours + Ampicillin 12 g as a continuous infusion every 24 hours  End date: 11/08/2019  IV antibiotic discharge orders are pended. To discharging provider:  please sign these orders via discharge navigator,  Select New Orders & click on the button choice - Manage This Unsigned Work.     Thank you for allowing pharmacy to be a part of this patient's care.  Jimmy Footman, PharmD, BCPS, [facility] Clinical Pharmacist Phone: [phone] 09/29/2019, 3:34 PM

## 2019-09-29 NOTE — Progress Notes (Addendum)
[name]  PROGRESS NOTE    Corey Oneill  [id number] DOB: December 14, 1935 DOA: 09/25/2019 PCP: Corey Lung, MD   Brief Narrative:   Corey Oneill a 83 y.o.malewith medical history significant ofsevere aortic stenosis status post TAVR, ITP, prostate cancer status post external beam radiation therapy with metastasis to left iliac bone., radiation prostatitis, hyperlipidemia, depression, GERD, coronary artery disease, chronic diastolic congestive heart failure, bullous pemphigus-on methotrexate, macrocytic anemia, alcohol abusepresented to emergency department with altered mental status.  11/13: Bacteremic. Now on ampicillin, rocephin. Appreciate ID help 11/14: No vegetation seen on TEE. 11/15: Bright, conversant this AM. Denies complaints.  11/16: Spoke with ID. No need for TEE. Needs PICC and 6 wks IV abx. PT rec SNF. Will work with CM for options.    Assessment & Plan:   Principal Problem:   Altered mental state Active Problems:   Hyperlipidemia   Prostate cancer ([place])   Depression   S/P TAVR (transcatheter aortic valve replacement)   Hx of radiation therapy   Bullous pemphigoid   Idiopathic thrombocytopenic purpura (ITP) (HCC)   Macrocytic anemia   SIRS (systemic inflammatory response syndrome) (HCC)  Altered mental status: Sepsis secondary to Enterococcus UTI/Bacteremia - Patient presented with tachycardia, tachypnea, fever of 102.2.  - Lactic acid: WNL. UA negative. Chest x-ray negative. - CT head without contrast came back negative for acute findings. - UCx (E faecalis), Bld Cx (GPC), BCID (enterococcus species) - ID onboard - TTEnegative for vegetation; ?TEE?, is it going to change Tx? Will defer to ID - Plan is for 6 weeks of dual beta lactam, followed by likely lifelong suppressive amox tx     - Place PICC for outpt abx; no need for TEE, appreciate ID assistance     - doing well, A&O x 3  Dysphagia - SLP recs:         - Diet  recommendations: Dysphagia 3 (mechanical soft);Thin liquid          - Liquids provided via: Cup         - Medication Administration: Crushed with puree         - Supervision: Staff to assist with self feeding;Full supervision/cueing for compensatory strategies         - Compensations: Minimize environmental distractions;Slow rate;Small sips/bites         - Postural Changes and/or Swallow Maneuvers: Seated upright 90 degrees  Debility - PT/OT rec SNF - working on options     - needs WC, RW  Chronic ITP: - No signs frank bleed, monitor  History of prostate cancer with metastasis to bone: - Status post radiation therapy.  Hypokalemia Hypophosphatemia Hyponatremia - continue K+ and phos - d/c IVF  Macrocytic anemia: - Likely secondary from methotrexate, B12 deficiency - B12 is low; replace     - stable  History of bullous pemphigoid: - Followed by dermatology outpatient - Recently started on methotrexate and folic acid; being held.  History of severe aortic stenosis status post TAVR: Aware - On telemetry.  Hypertension: - on coreg, imdur at home; holding     - BP is ok, monitor  Alcohol abuse: - stable, not on CIWA, no need  DVT prophylaxis: SCDs Code Status: DNR   Disposition Plan: TBD  Consultants:   ID  Antimicrobials:   Ampicillin, rocephin   Subjective: "How much more is there to do?"  Objective: Vitals:   09/28/19 2353 09/29/19 0338 09/29/19 0823 09/29/19 1139  BP: 128/86 (!) 159/80 134/77 (!) 136/59  Pulse: 77 77 81 72  Resp: 14 11    Temp: 98.3 F (36.8 C) 98.6 F (37 C) 97.8 F (36.6 C) 98.2 F (36.8 C)  TempSrc: Oral Oral Oral Oral  SpO2: 99% 99% 100% 98%  Weight:      Height:        Intake/Output Summary (Last 24 hours) at 09/29/2019 1448 Last data filed at 09/29/2019 [id number] Gross per 24 hour  Intake 3286.63 ml  Output --  Net 3286.63 ml   Filed Weights   09/25/19 1124  09/26/19 0005  Weight: 66.2 kg 63.3 kg    Examination:  General: 83 y.o. male resting in bed in NAD Cardiovascular: regular, +s1s2, no m/g/r Respiratory: clear, no wheeze, rhonchi, WOB normal GI: BS+, ndnt, soft MSK: No e/c/c Neuro: A&O x3, follows commands  Data Reviewed: I have personally reviewed following labs and imaging studies.  CBC: Recent Labs  Lab 09/25/19 1000 09/25/19 1425 09/26/19 0309 09/27/19 0524 09/28/19 0402  WBC 4.3  --  6.6 4.7 3.1*  NEUTROABS 3.6  --   --  3.5 1.7  HGB 11.9*  --  12.7* 11.5* 10.7*  HCT 36.4* 33.6* 36.4* 33.1* 30.9*  MCV 100.6*  --  96.8 94.3 96.0  PLT 37*  --  31* 33* 50*   Basic Metabolic Panel: Recent Labs  Lab 09/25/19 1000 09/25/19 1425 09/26/19 0309 09/27/19 0524 09/28/19 0402 09/29/19 1145  NA 133*  --  130* 132* 138 137  K 3.8  --  3.5 2.6* 3.4* 4.1  CL 99  --  99 96* 103 101  CO2 23  --  19* 22 24 24   GLUCOSE 106*  --  109* 98 103* 103*  BUN 10  --  8 12 10 11   CREATININE 0.80  --  0.67 0.70 0.67 0.68  CALCIUM 8.0*  --  7.6* 7.5* 7.8* 7.9*  MG  --  2.0  --  2.2 2.4 2.2  PHOS  --  1.9*  --  1.8* 1.8* 3.2   GFR: Estimated Creatinine Clearance: 64.8 mL/min (by C-G formula based on SCr of 0.68 mg/dL). Liver Function Tests: Recent Labs  Lab 09/25/19 1000 09/27/19 0524 09/28/19 0402 09/29/19 1145  AST 17  --   --   --   ALT 9  --   --   --   ALKPHOS 70  --   --   --   BILITOT 0.8  --   --   --   PROT 6.8  --   --   --   ALBUMIN 3.3* 2.7* 2.5* 2.3*   No results for input(s): LIPASE, AMYLASE in the last 168 hours. Recent Labs  Lab 09/25/19 1425  AMMONIA 27   Coagulation Profile: Recent Labs  Lab 09/25/19 1000  INR 1.2   Cardiac Enzymes: No results for input(s): CKTOTAL, CKMB, CKMBINDEX, TROPONINI in the last 168 hours. BNP (last 3 results) No results for input(s): PROBNP in the last 8760 hours. HbA1C: No results for input(s): HGBA1C in the last 72 hours. CBG: Recent Labs  Lab 09/25/19 0826    GLUCAP 87   Lipid Profile: No results for input(s): CHOL, HDL, LDLCALC, TRIG, CHOLHDL, LDLDIRECT in the last 72 hours. Thyroid Function Tests: No results for input(s): TSH, T4TOTAL, FREET4, T3FREE, THYROIDAB in the last 72 hours. Anemia Panel: No results for input(s): VITAMINB12, FOLATE, FERRITIN, TIBC, IRON, RETICCTPCT in the last 72 hours. Sepsis Labs: Recent Labs  Lab 09/25/19 1000  LATICACIDVEN 1.3  Recent Results (from the past 240 hour(s))  Blood Culture (routine x 2)     Status: Abnormal   Collection Time: 09/25/19 10:00 AM   Specimen: BLOOD  Result Value Ref Range Status   Specimen Description BLOOD RIGHT ANTECUBITAL  Final   Special Requests   Final    BOTTLES DRAWN AEROBIC AND ANAEROBIC Blood Culture results may not be optimal due to an excessive volume of blood received in culture bottles   Culture  Setup Time   Final    IN BOTH AEROBIC AND ANAEROBIC BOTTLES GRAM POSITIVE COCCI CRITICAL VALUE NOTED.  VALUE IS CONSISTENT WITH PREVIOUSLY REPORTED AND CALLED VALUE. Performed at [facility], [place] [address] [place], [place] [address]    Culture ENTEROCOCCUS FAECALIS (A)  Final   Report Status 09/28/2019 FINAL  Final   Organism ID, Bacteria ENTEROCOCCUS FAECALIS  Final      Susceptibility   Enterococcus faecalis - MIC*    AMPICILLIN <=2 SENSITIVE Sensitive     VANCOMYCIN 1 SENSITIVE Sensitive     GENTAMICIN SYNERGY SENSITIVE Sensitive     * ENTEROCOCCUS FAECALIS  Blood Culture (routine x 2)     Status: Abnormal   Collection Time: 09/25/19 10:06 AM   Specimen: BLOOD  Result Value Ref Range Status   Specimen Description BLOOD BLOOD LEFT WRIST  Final   Special Requests   Final    BOTTLES DRAWN AEROBIC AND ANAEROBIC Blood Culture adequate volume   Culture  Setup Time   Final    IN BOTH AEROBIC AND ANAEROBIC BOTTLES GRAM POSITIVE COCCI CRITICAL RESULT CALLED TO, READ BACK BY AND VERIFIED WITH: PHRMD C PIERCE @0436  09/26/19 BY S GEZAHEGN    Culture (A)   Final    ENTEROCOCCUS FAECALIS SUSCEPTIBILITIES PERFORMED ON PREVIOUS CULTURE WITHIN THE LAST 5 DAYS. Performed at [facility], [place] [address] [place], [place] [address]    Report Status 09/28/2019 FINAL  Final  Blood Culture ID Panel (Reflexed)     Status: Abnormal   Collection Time: 09/25/19 10:06 AM  Result Value Ref Range Status   Enterococcus species DETECTED (A) NOT DETECTED Final    Comment: CRITICAL RESULT CALLED TO, READ BACK BY AND VERIFIED WITH: PHRMD C PIERCE @0436  09/26/19 BY S GEZAHEGN    Vancomycin resistance NOT DETECTED NOT DETECTED Final   Listeria monocytogenes NOT DETECTED NOT DETECTED Final   Staphylococcus species NOT DETECTED NOT DETECTED Final   Staphylococcus aureus (BCID) NOT DETECTED NOT DETECTED Final   Streptococcus species NOT DETECTED NOT DETECTED Final   Streptococcus agalactiae NOT DETECTED NOT DETECTED Final   Streptococcus pneumoniae NOT DETECTED NOT DETECTED Final   Streptococcus pyogenes NOT DETECTED NOT DETECTED Final   Acinetobacter baumannii NOT DETECTED NOT DETECTED Final   Enterobacteriaceae species NOT DETECTED NOT DETECTED Final   Enterobacter cloacae complex NOT DETECTED NOT DETECTED Final   Escherichia coli NOT DETECTED NOT DETECTED Final   Klebsiella oxytoca NOT DETECTED NOT DETECTED Final   Klebsiella pneumoniae NOT DETECTED NOT DETECTED Final   Proteus species NOT DETECTED NOT DETECTED Final   Serratia marcescens NOT DETECTED NOT DETECTED Final   Haemophilus influenzae NOT DETECTED NOT DETECTED Final   Neisseria meningitidis NOT DETECTED NOT DETECTED Final   Pseudomonas aeruginosa NOT DETECTED NOT DETECTED Final   Candida albicans NOT DETECTED NOT DETECTED Final   Candida glabrata NOT DETECTED NOT DETECTED Final   Candida krusei NOT DETECTED NOT DETECTED Final   Candida parapsilosis NOT DETECTED NOT DETECTED  Final   Candida tropicalis NOT DETECTED NOT DETECTED Final    Comment: Performed at [facility], [place]  [address] [place], [place] [address]  Urine culture     Status: Abnormal   Collection Time: 09/25/19 11:34 AM   Specimen: In/Out Cath Urine  Result Value Ref Range Status   Specimen Description IN/OUT CATH URINE  Final   Special Requests   Final    NONE Performed at [facility], [place] [address] [place], [place] [address]    Culture 5,000 COLONIES/mL ENTEROCOCCUS FAECALIS (A)  Final   Report Status 09/27/2019 FINAL  Final   Organism ID, Bacteria ENTEROCOCCUS FAECALIS (A)  Final      Susceptibility   Enterococcus faecalis - MIC*    AMPICILLIN 8 SENSITIVE Sensitive     LEVOFLOXACIN 2 SENSITIVE Sensitive     NITROFURANTOIN <=16 SENSITIVE Sensitive     VANCOMYCIN 1 SENSITIVE Sensitive     * 5,000 COLONIES/mL ENTEROCOCCUS FAECALIS  SARS CORONAVIRUS 2 (TAT 6-24 HRS) Nasopharyngeal Nasopharyngeal Swab     Status: None   Collection Time: 09/25/19  3:41 PM   Specimen: Nasopharyngeal Swab  Result Value Ref Range Status   SARS Coronavirus 2 NEGATIVE NEGATIVE Final    Comment: (NOTE) SARS-CoV-2 target nucleic acids are NOT DETECTED. The SARS-CoV-2 RNA is generally detectable in upper and lower respiratory specimens during the acute phase of infection. Negative results do not preclude SARS-CoV-2 infection, do not rule out co-infections with other pathogens, and should not be used as the sole basis for treatment or other patient management decisions. Negative results must be combined with clinical observations, patient history, and epidemiological information. The expected result is Negative. Fact Sheet for Patients: SugarRoll.be Fact Sheet for Healthcare Providers: https://www.woods-mathews.com/ This test is not yet approved or cleared by the [place] FDA and  has been authorized for detection and/or diagnosis of SARS-CoV-2 by FDA under an Emergency Use Authorization (EUA). This EUA will remain  in effect (meaning this test can be used) for the  duration of the COVID-19 declaration under Section 56 4(b)(1) of the Act, 21 U.S.C. section 360bbb-3(b)(1), unless the authorization is terminated or revoked sooner. Performed at [facility], [place] [address], [place], [place] [address]   Culture, blood (routine x 2)     Status: None (Preliminary result)   Collection Time: 09/27/19  5:17 AM   Specimen: BLOOD  Result Value Ref Range Status   Specimen Description BLOOD RIGHT ANTECUBITAL  Final   Special Requests   Final    BOTTLES DRAWN AEROBIC AND ANAEROBIC Blood Culture adequate volume   Culture   Final    NO GROWTH 2 DAYS Performed at [facility], [place] [address], [place], [place] [address]    Report Status PENDING  Incomplete  Culture, blood (routine x 2)     Status: None (Preliminary result)   Collection Time: 09/27/19  5:20 AM   Specimen: BLOOD RIGHT HAND  Result Value Ref Range Status   Specimen Description BLOOD RIGHT HAND  Final   Special Requests AEROBIC BOTTLE ONLY Blood Culture adequate volume  Final   Culture   Final    NO GROWTH 2 DAYS Performed at [facility], [place] [address] [place], [place] [address]    Report Status PENDING  Incomplete      Radiology Studies: [place] Ekg Site Rite  Result Date: 09/29/2019 If Site Rite image not attached, placement could not be confirmed due  to current cardiac rhythm.    Scheduled Meds:  Chlorhexidine Gluconate Cloth  6 each Topical Daily   potassium chloride  40 mEq Oral BID   vitamin B-12  500 mcg Oral Daily   Continuous Infusions:  sodium chloride 10 mL/hr at 09/29/19 0600   ampicillin (OMNIPEN) IV 2 g (09/29/19 1356)   cefTRIAXone (ROCEPHIN)  IV 2 g (09/29/19 1012)     LOS: 4 days    Time spent: 25 minutes spent in the coordination of care today.    Jonnie Finner, DO [facility] Pager [phone]  If 7PM-7AM, please contact night-coverage [email or web address] Password [facility] 09/29/2019, 2:48 PM

## 2019-09-29 NOTE — TOC Progression Note (Addendum)
Transition of Care [facility]) - Progression Note    Patient Details  Name: Corey Oneill MRN: [id number] Date of Birth: 1936/01/27  Transition of Care [facility]) CM/SW [place], [place] Phone Number: 09/29/2019, 4:15 PM  Clinical Narrative:   CSW met with patient's wife this morning, discussed that the current bed offer is [facility]; no other facility has offered a bed for the patient. Wife was hopeful for [facility], who had declined due to no bed availability. CSW reached out to Admissions to ask about any change in bed availability, and they do have beds available. Admissions will have to review referral again to see if they can offer.  CSW sent requested information in to [facility] for PASRR review, as patient's PASRR went to manual review. [facility] requested additional information regarding patient's CIWA and AMS, and CSW asked MD to update his progress note to discuss updates per [facility] request. CSW sent progress note into [facility]. CSW to follow.    Expected Discharge Plan: [place] Barriers to Discharge: Continued Medical Work up  Expected Discharge Plan and Services Expected Discharge Plan: [place] Choice: [place] arrangements for the past 2 months: Single Family Home                                       Social Determinants of Health (SDOH) Interventions    Readmission Risk Interventions No flowsheet data found.

## 2019-09-30 DIAGNOSIS — D693 Immune thrombocytopenic purpura: Secondary | ICD-10-CM | POA: Diagnosis not present

## 2019-09-30 DIAGNOSIS — Z7401 Bed confinement status: Secondary | ICD-10-CM | POA: Diagnosis not present

## 2019-09-30 DIAGNOSIS — N390 Urinary tract infection, site not specified: Secondary | ICD-10-CM | POA: Diagnosis not present

## 2019-09-30 DIAGNOSIS — C61 Malignant neoplasm of prostate: Secondary | ICD-10-CM | POA: Diagnosis not present

## 2019-09-30 DIAGNOSIS — G9340 Encephalopathy, unspecified: Secondary | ICD-10-CM | POA: Diagnosis not present

## 2019-09-30 DIAGNOSIS — R1319 Other dysphagia: Secondary | ICD-10-CM | POA: Diagnosis not present

## 2019-09-30 DIAGNOSIS — Z20828 Contact with and (suspected) exposure to other viral communicable diseases: Secondary | ICD-10-CM | POA: Diagnosis not present

## 2019-09-30 DIAGNOSIS — Z952 Presence of prosthetic heart valve: Secondary | ICD-10-CM | POA: Diagnosis not present

## 2019-09-30 DIAGNOSIS — I330 Acute and subacute infective endocarditis: Secondary | ICD-10-CM

## 2019-09-30 DIAGNOSIS — A4181 Sepsis due to Enterococcus: Secondary | ICD-10-CM | POA: Diagnosis not present

## 2019-09-30 DIAGNOSIS — F322 Major depressive disorder, single episode, severe without psychotic features: Secondary | ICD-10-CM | POA: Diagnosis not present

## 2019-09-30 DIAGNOSIS — R1311 Dysphagia, oral phase: Secondary | ICD-10-CM | POA: Diagnosis not present

## 2019-09-30 DIAGNOSIS — R278 Other lack of coordination: Secondary | ICD-10-CM | POA: Diagnosis not present

## 2019-09-30 DIAGNOSIS — M6281 Muscle weakness (generalized): Secondary | ICD-10-CM | POA: Diagnosis not present

## 2019-09-30 DIAGNOSIS — M2550 Pain in unspecified joint: Secondary | ICD-10-CM | POA: Diagnosis not present

## 2019-09-30 DIAGNOSIS — R4182 Altered mental status, unspecified: Secondary | ICD-10-CM | POA: Diagnosis not present

## 2019-09-30 DIAGNOSIS — B952 Enterococcus as the cause of diseases classified elsewhere: Secondary | ICD-10-CM | POA: Diagnosis not present

## 2019-09-30 DIAGNOSIS — D539 Nutritional anemia, unspecified: Secondary | ICD-10-CM | POA: Diagnosis not present

## 2019-09-30 DIAGNOSIS — A419 Sepsis, unspecified organism: Secondary | ICD-10-CM | POA: Diagnosis not present

## 2019-09-30 DIAGNOSIS — L120 Bullous pemphigoid: Secondary | ICD-10-CM | POA: Diagnosis not present

## 2019-09-30 DIAGNOSIS — I38 Endocarditis, valve unspecified: Secondary | ICD-10-CM | POA: Diagnosis not present

## 2019-09-30 DIAGNOSIS — C799 Secondary malignant neoplasm of unspecified site: Secondary | ICD-10-CM | POA: Diagnosis not present

## 2019-09-30 DIAGNOSIS — F341 Dysthymic disorder: Secondary | ICD-10-CM | POA: Diagnosis not present

## 2019-09-30 DIAGNOSIS — E785 Hyperlipidemia, unspecified: Secondary | ICD-10-CM | POA: Diagnosis not present

## 2019-09-30 DIAGNOSIS — R5381 Other malaise: Secondary | ICD-10-CM | POA: Diagnosis not present

## 2019-09-30 DIAGNOSIS — R2681 Unsteadiness on feet: Secondary | ICD-10-CM | POA: Diagnosis not present

## 2019-09-30 DIAGNOSIS — I10 Essential (primary) hypertension: Secondary | ICD-10-CM | POA: Diagnosis not present

## 2019-09-30 DIAGNOSIS — I2510 Atherosclerotic heart disease of native coronary artery without angina pectoris: Secondary | ICD-10-CM | POA: Diagnosis not present

## 2019-09-30 DIAGNOSIS — R7881 Bacteremia: Secondary | ICD-10-CM | POA: Diagnosis not present

## 2019-09-30 DIAGNOSIS — F329 Major depressive disorder, single episode, unspecified: Secondary | ICD-10-CM | POA: Diagnosis not present

## 2019-09-30 LAB — RENAL FUNCTION PANEL
Albumin: 2.6 g/dL — ABNORMAL LOW (ref 3.5–5.0)
Anion gap: 11 (ref 5–15)
BUN: 9 mg/dL (ref 8–23)
CO2: 24 mmol/L (ref 22–32)
Calcium: 8.1 mg/dL — ABNORMAL LOW (ref 8.9–10.3)
Chloride: 104 mmol/L (ref 98–111)
Creatinine, Ser: 0.61 mg/dL (ref 0.61–1.24)
GFR calc Af Amer: >60 mL/min (ref >60)
GFR calc non Af Amer: >60 mL/min (ref >60)
Glucose, Bld: 95 mg/dL (ref 70–99)
Phosphorus: 3.2 mg/dL (ref 2.5–4.6)
Potassium: 3.9 mmol/L (ref 3.5–5.1)
Sodium: 139 mmol/L (ref 135–145)

## 2019-09-30 LAB — MAGNESIUM: Magnesium: 2.4 mg/dL (ref 1.7–2.4)

## 2019-09-30 MED ORDER — CARVEDILOL 3.125 MG PO TABS
3.1250 mg | ORAL_TABLET | Freq: Two times a day (BID) | ORAL | Status: DC
  Administered 2019-09-30 (×2): 3.125 mg via ORAL
  Filled 2019-09-30 (×2): qty 1

## 2019-09-30 MED ORDER — CEFTRIAXONE IV (FOR PTA / DISCHARGE USE ONLY): 2.0000 g | Freq: Two times a day (BID) | INTRAVENOUS | 0 refills | Status: AC

## 2019-09-30 MED ORDER — AMPICILLIN IV (FOR PTA / DISCHARGE USE ONLY): 12.0000 g | INTRAVENOUS | 0 refills | Status: AC

## 2019-09-30 MED ORDER — ISOSORBIDE MONONITRATE ER 30 MG PO TB24
30.0000 mg | ORAL_TABLET | Freq: Every day | ORAL | Status: DC
  Administered 2019-09-30: 30 mg via ORAL
  Filled 2019-09-30 (×2): qty 1

## 2019-09-30 NOTE — TOC Transition Note (Signed)
Transition of Care [facility]) - CM/SW Discharge Note   Patient Details  Name: Corey Oneill MRN: [id number] Date of Birth: 08/24/1936  Transition of Care [facility]) CM/SW Contact:  Kirstie Peri, [place] Work Phone Number: 09/30/2019, 1:18 PM   Clinical Narrative:    Nurse to call report to [phone]. Transport set for [employer].   Final next level of care: [place] Barriers to Discharge: Barriers Resolved   Patient Goals and CMS Choice Patient states their goals for this hospitalization and ongoing recovery are:: patient unable to participate in goal setting at this time due to confusion CMS Medicare.gov Compare Post Acute Care list provided to:: Patient Represenative (must comment) Choice offered to / list presented to : Spouse  Discharge Placement              Patient [facility]: [facility] Patient to be transferred to facility by: [place] Name of family member notified: [name], Spouse Patient and family notified of of transfer: 09/30/19  Discharge Plan and Services     Post Acute Care Choice: [place]                               Social Determinants of Health (SDOH) Interventions     Readmission Risk Interventions No flowsheet data found.

## 2019-09-30 NOTE — Discharge Summary (Signed)
. Physician Discharge Summary  Corey Oneill ZDG:[id number] DOB: 1936-02-16 DOA: 09/25/2019  PCP: [name], MD  Admit date: 09/25/2019 Discharge date: 09/30/2019  Admitted From: Home Disposition:  Discharged to SNF  Recommendations for Outpatient Follow-up:  1. Follow up with PCP in 1-2 weeks 2. Please obtain BMP/CBC in one week   Discharge Condition: Stable  CODE STATUS: FULL   Brief/Interim Summary: Corey Oneill a 83 y.o.malewith medical history significant ofsevere aortic stenosis status post TAVR, ITP, prostate cancer status post external beam radiation therapy with metastasis to left iliac bone., radiation prostatitis, hyperlipidemia, depression, GERD, coronary artery disease, chronic diastolic congestive heart failure, bullous pemphigus-on methotrexate, macrocytic anemia, alcohol abusepresented to emergency department with altered mental status.Found to have enteroccocus bacteremia.  Discharge Diagnoses:  Principal Problem:   Altered mental state Active Problems:   Hyperlipidemia   Prostate cancer ([place])   Depression   S/P TAVR (transcatheter aortic valve replacement)   Hx of radiation therapy   Bullous pemphigoid   Idiopathic thrombocytopenic purpura (ITP) (HCC)   Macrocytic anemia   SIRS (systemic inflammatory response syndrome) (HCC)  Altered mental status: Sepsis secondary to Enterococcus UTI/Bacteremia Presummed endocarditis - Patient presented with tachycardia, tachypnea, fever of 102.2.  - Lactic acid: WNL. UA negative. Chest x-ray negative. - CT head without contrast came back negative for acute findings. 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  Dysphagia - SLPrecs: - Diet recommendations: Dysphagia 3 (mechanical soft);Thin liquid -Liquids provided via: Cup -Medication Administration: Crushed with puree -Supervision: Staff to assist with self feeding;Full supervision/cueing for compensatory strategies -Compensations: Minimize environmental distractions;Slow rate;Small sips/bites -Postural Changes and/or Swallow Maneuvers: Seated upright 90 degrees  Debility - PT/OT rec SNF - working on options     - needs WC, RW     - will d/c to SNF today  Chronic ITP: - No signs frank bleed, monitor  History of prostate cancer with metastasis to bone: - Status post radiation therapy.  Hypokalemia Hypophosphatemia Hyponatremia -continueK+ and phos -d/cIVF     - lytes look good; resolved  Macrocytic anemia: - Likely secondary from methotrexate, B12 deficiency - B12 is low; replace     - stable  History of bullous pemphigoid: - Followed by dermatology outpatient - Recently started on methotrexate and folic acid; being held.     - resume at discharge  History of severe aortic stenosis status post TAVR: Aware - On telemetry.  Hypertension: - on coreg, imdur at home; resumed     - BP is ok, monitor  Alcohol abuse: - stable, not on CIWA, no need   Discharge Instructions   Allergies as of 09/30/2019      Reactions   Prednisone Other (See Comments)   Makes depressed      Medication List    STOP taking these medications   ferrous sulfate 325 (65 FE) MG EC tablet     TAKE these medications   acetaminophen 500 MG tablet Commonly known as: TYLENOL Take 500  mg by mouth every 6 (six) hours as needed for moderate pain.   ampicillin   IVPB Inject 12 g into the vein daily. As a continuous infusion. Indication:  Enterococcal bacteremia w/ concern for seeding of TAVR  Last Day of Therapy:  11/08/2019 Labs - Once weekly:  CBC/D and BMP, Labs - Every other week:  ESR and CRP   aspirin EC 81 MG tablet Take 81 mg by mouth at bedtime.   carvedilol 3.125 MG tablet Commonly known as: COREG TAKE 1 TABLET BY MOUTH TWICE DAILY.   cefTRIAXone  IVPB Commonly known as: ROCEPHIN Inject 2 g into the vein every 12 (twelve) hours. Indication:  Enterococcal bacteremia w/ concern for seeding of TAVR  Last Day of Therapy:  11/08/2019 Labs - Once weekly:  CBC/D and BMP, Labs - Every other week:  ESR and CRP   cyanocobalamin 2000 MCG tablet Take 2,000 mcg by mouth daily.   DULoxetine 30 MG capsule Commonly known as: CYMBALTA Take 30 mg by mouth daily.   enzalutamide 40 MG capsule Commonly known as: XTANDI Take 160 mg by mouth daily.   folic acid 1 MG tablet Commonly known as: FOLVITE Take 1 mg by mouth See admin instructions. Everyday but Tues   isosorbide mononitrate 30 MG 24 hr tablet Commonly known as: IMDUR Take 1 tablet (30 mg total) by mouth daily.   methotrexate 2.5 MG tablet Commonly known as: RHEUMATREX Take 10 mg by mouth every Tuesday.   MULTIPLE VITAMIN PO Take 1 tablet by mouth daily.   nitroGLYCERIN 0.4 MG SL tablet Commonly known as: NITROSTAT 1 TAB UNDER TONGUE AS NEEDED FOR CHEST PAIN. MAY REPEAT EVERY 5 MIN FOR A TOTAL OF 3 DOSES. What changed: See the new instructions.   rosuvastatin 10 MG tablet Commonly known as: CRESTOR TAKE 1 TABLET ONCE DAILY.            Home Infusion Instuctions  (From admission, onward)         Start     Ordered   09/30/19 0000  Home infusion instructions [facility] May follow [place] Dosing Protocol; May administer Cathflo as needed to maintain patency of vascular access device.; [place] of vascular access device: per [facility] Protocol: 0.9% NaCl  pre/post medica...    Question Answer Comment  Instructions May follow [place] Dosing Protocol   Instructions May administer Cathflo as needed to maintain patency of vascular access device.   Instructions Flushing of vascular access device: per [facility] Protocol: 0.9% NaCl pre/post medication administration and prn patency; Heparin 100 u/ml, 33m for implanted ports and Heparin 10u/ml, 565mfor all other central venous catheters.   Instructions May follow [facility] Anaphylaxis Protocol for First Dose Administration in the home: 0.9% NaCl at 25-50 ml/hr to maintain IV access for protocol meds. Epinephrine 0.3 ml IV/IM PRN and Benadryl 25-50 IV/IM PRN s/s of anaphylaxis.   Instructions [facility] Infusion Coordinator (RN) to assist per patient IV care needs in the home PRN.      09/30/19 12[place](From admission, onward)         Start     Ordered   09/29/19 1454  For home use only DME standard manual wheelchair with seat cushion  Once    Comments: Patient suffers from gait disturbance, debility which impairs their ability to perform daily activitiesin the home.  Pt with very poor awareness & ability to manage  RW, requiring max cuing to ambulate within base of AD with poor demo & pt frequently bumping objects in room, requiring assistance to steer AD. A wheelchair will allow patient to safely perform daily activities. Patient can safely propel the wheelchair in the home or has a caregiver who can provide assistance. Length of need 12 months. Accessories: elevating leg rests (ELRs), wheel locks, extensions and anti-tippers.   09/29/19 1454   09/29/19 1452  For home use only DME Walker rolling  Once    Question:  Patient needs a walker to treat with the following condition  Answer:  Debility   09/29/19 1454         Follow-up Information    Comer, Okey Regal, MD Follow up.   Specialty: Infectious Diseases Why: 10/27/19 at 9:30 am with Dr. Imagene Gurney  information: [place] [place] [address] [phone]          Allergies  Allergen Reactions  . Prednisone Other (See Comments)    Makes depressed    Consultations:  ID   Procedures/Studies: Ct Head Wo Contrast  Result Date: 09/25/2019 CLINICAL DATA:  83 year old male with altered mental status. EXAM: CT HEAD WITHOUT CONTRAST TECHNIQUE: Contiguous axial images were obtained from the base of the skull through the vertex without intravenous contrast. COMPARISON:  Head CT dated 08/21/2015. FINDINGS: Brain: Mild to moderate age-related atrophy and chronic microvascular ischemic changes. There is no acute intracranial hemorrhage. No mass effect or midline shift. No extra-axial fluid collection. Vascular: No hyperdense vessel or unexpected calcification. Skull: Normal. Negative for fracture or focal lesion. Sinuses/Orbits: The visualized paranasal sinuses are clear. Minimal left mastoid effusions. The right mastoid air cells are clear. No air-fluid level. Other: Focal area of defect in the right maxilla at the lateral incisor tooth, chronic. IMPRESSION: 1. No acute intracranial hemorrhage. 2. Age-related atrophy and chronic microvascular ischemic changes. Electronically Signed   By: Anner Crete M.D.   On: 09/25/2019 11:58   Dg Chest Port 1 View  Result Date: 09/25/2019 CLINICAL DATA:  Altered mental status. EXAM: PORTABLE CHEST 1 VIEW COMPARISON:  08/21/2019 FINDINGS: The heart size and mediastinal contours are within normal limits. Both lungs are clear. The visualized skeletal structures are unremarkable. IMPRESSION: No active disease. Electronically Signed   By: Kerby Moors M.D.   On: 09/25/2019 10:04   Dg Swallowing Func-speech Pathology  Result Date: 09/26/2019 Objective Swallowing Evaluation: Type of Study: MBS-Modified Barium Swallow Study  Patient Details Name: JUANMIGUEL DEFELICE MRN: [id number] Date of Birth: 16-Apr-1936 Today's Date: 09/26/2019 Time: SLP Start  Time (ACUTE ONLY): 1315 -SLP Stop Time (ACUTE ONLY): 1340 SLP Time Calculation (min) (ACUTE ONLY): 25 min Past Medical History: Past Medical History: Diagnosis Date . Abdominal aortic atherosclerosis ([place])  . Anginal pain ([place])   last noted 08/17/19 . Bone cancer ([place])   Left hip . BPPV (benign paroxysmal positional vertigo)  . Bullous pemphigoid  . CAD (coronary artery disease)   a.  LHC 8/16: Mid to distal LAD 30%, OM1 40%, proximal mid RCA 40%, distal RCA 60% >> FFR 0.69  >> PCI: 3 x 15 mm Resolute DES . Depression  . Esophageal reflux  . Family history of adverse reaction to anesthesia   "daughter has PONV" . Grade I diastolic dysfunction 21/30/8657  Noted on ECHO  . Grade I diastolic dysfunction 84/6962  Noted on ECHO . H/O blood clots   "get them in my stool and urine" (11/12/2015) . Heart murmur  . Hepatic  lesion 12/08/2015  Stable 8 mm right hepatic lesion . History of aortic stenosis   a. peak to peak gradient by LHC 8/16:  37 mmHg (moderately severe)  . History of appendicitis 2016 . History of blood transfusion 12/2018 . History of herpes labialis  . History of ITP   2018, thrombocytopenia . History of kidney stones  . Hx of radiation therapy 04/14/13-06/09/13  prostate 7800 cGy, 40 sessions, seminal vesicles 5600 cGy 40 sessions . Hydrocele 2006  Small . Hyperlipidemia  . Insomnia   resolved . LVH (left ventricular hypertrophy)   Moderate, noted on ECHO 09/2018 . Malignant melanoma in situ ([place]) 09/04/2016  Right neck and chest . Melanoma ([place])   pt denies . Metastasis from malignant neoplasm of prostate ([place]) 02/25/2019 . Prostate cancer ([place]) dx'd 2014 . Radiation proctitis  . Renal mass 2017  Bilateral renal masses . S/P skin biopsy 04/09/2019  subepidermal cell poor vesicle , Linear IGG and C3 the basement membrane . Suprapubic catheter ([place])  . Wears hearing aid in both ears  Past Surgical History: Past Surgical History: Procedure Laterality Date . CARDIAC CATHETERIZATION N/A 04/21/2015  Procedure:  Right/Left Heart Cath and Coronary Angiography;  Surgeon: [name], MD;  Location: [facility];  Service: Cardiovascular;  Laterality: N/A; . CARDIAC CATHETERIZATION N/A 06/18/2015  Procedure: Intravascular Pressure Wire/FFR Study;  Surgeon: [name], MD;  Location: [facility];  Service: Cardiovascular;  Laterality: N/A; . CARDIAC CATHETERIZATION N/A 06/18/2015  Procedure: Coronary Stent Intervention;  Surgeon: [name], MD;  Location: [facility];  Service: Cardiovascular;  Laterality: N/A; . CARDIAC CATHETERIZATION N/A 06/18/2015  Procedure: Right/Left Heart Cath and Coronary Angiography;  Surgeon: [name], MD;  Location: [facility];  Service: Cardiovascular;  Laterality: N/A; . CARDIAC CATHETERIZATION N/A 06/23/2015  Procedure: Left Heart Cath and Cors/Grafts Angiography;  Surgeon: [name], MD;  Location: [facility];  Service: Cardiovascular;  Laterality: N/A; . CARDIAC CATHETERIZATION N/A 01/17/2016  Procedure: Right/Left Heart Cath and Coronary Angiography;  Surgeon: [name], MD;  Location: [facility];  Service: Cardiovascular;  Laterality: N/A; . CATARACT EXTRACTION, BILATERAL   . COLONOSCOPY  06/26/2017 . CYSTOSCOPY WITH BIOPSY N/A 07/30/2018  Procedure: CYSTOSCOPY WITH BIOPSY/FULGURATION, CYSTOLTHOLAPAXY;  Surgeon: [name], MD;  [facility];  Service: Urology;  Laterality: N/A; . CYSTOSCOPY WITH URETHRAL DILATATION N/A 08/18/2019  Procedure: CYSTOSCOPY WITH URETHRAL DILATATION USING BALLOON OR LASER/ SPURO PUBIC TUBE CHANGE LASER EXCISION OF URETHRAL STRICTURE RETROGRADE URETHROGRAM ANTEGRADE CYSTOGRAM;  Surgeon: [name], MD;  Location: [facility];  Service: Urology;  Laterality: N/A; . FRACTURE SURGERY   . INGUINAL HERNIA REPAIR    patient does not remember this procedure . LAPAROSCOPIC APPENDECTOMY N/A 11/16/2015  Procedure: APPENDECTOMY LAPAROSCOPIC;  Surgeon: [name], MD;  Location: [facility];   Service: General;  Laterality: N/A; . LEFT HEART CATH AND CORONARY ANGIOGRAPHY N/A 12/26/2016  Procedure: Left Heart Cath and Coronary Angiography;  Surgeon: Troy Sine, MD;  Location: [facility];  Service: Cardiovascular;  Laterality: N/A; . MELANOMA EXCISION Right 10/24/2016  Procedure: WIDE EXCISION MELANOMA RIGHT NECK AND RIGHT CHEST TIMES 2;  Surgeon: [name], MD;  Location: [facility];  Service: General;  Laterality: Right;  right medial and lateral lesion of chest and right neck . NASAL FRACTURE SURGERY    "broken years ago; several ORs to correct it" . NASAL SEPTUM SURGERY   . PROSTATE  BIOPSY  2014  "needle biopsy" . TEE WITHOUT CARDIOVERSION N/A 02/15/2016  Procedure: TRANSESOPHAGEAL ECHOCARDIOGRAM (TEE);  Surgeon: [name], MD;  Location: [facility];  Service: Open Heart Surgery;  Laterality: N/A; . TEE WITHOUT CARDIOVERSION N/A 08/26/2019  Procedure: TRANSESOPHAGEAL ECHOCARDIOGRAM (TEE);  Surgeon: Pixie Casino, MD;  Location: [facility];  Service: Cardiovascular;  Laterality: N/A; . TONSILLECTOMY   . TRANSCATHETER AORTIC VALVE REPLACEMENT, TRANSFEMORAL  02/15/2016 . TRANSCATHETER AORTIC VALVE REPLACEMENT, TRANSFEMORAL N/A 02/15/2016  Procedure: TRANSCATHETER AORTIC VALVE REPLACEMENT, TRANSFEMORAL;  Surgeon: [name], MD;  Location: [facility];  Service: Open Heart Surgery;  Laterality: N/A; . TRANSURETHRAL RESECTION OF PROSTATE  12/23/2018  Procedure: CYSTOSCOPY WITH  BLADDER BIOPSY WITH FULGERATION/ TRANSURETHRAL RESECTION PROSTATE  AND PROSTATE BIOPSY;  Surgeon: [name], MD;  [facility];  Service: Urology;; HPI: 83 y.o. male with medical history significant of severe aortic stenosis status post TAVR, ITP, prostate cancer status post external beam radiation therapy with metastasis to left iliac bone., radiation prostatitis, hyperlipidemia, depression, GERD, coronary artery disease, chronic diastolic congestive heart failure, bullous pemphigus-on  methotrexate, macrocytic anemia, alcohol abuse presented to emergency department with altered mental status. CT head negative for acute abnormality.   Subjective: alert, confused Assessment / Plan / Recommendation CHL IP CLINICAL IMPRESSIONS 09/26/2019 Clinical Impression Pt presents with a moderate oropharyngeal dysphagia marked by impaired bolus control/cohesion, decreased propulsion of all consistencies through pharynx with poor pharyngeal squeeze and inconsistent epiglottic inversion.  These deficits led to diffuse barium residue in the valleculae and pyriforms.  Residue accumulated throughout test, leading to eventual spillage into larynx, reaching the vocal folds.  There was no observed aspiration, but pt is at high risk given this decreased overall function. After returning to the room, pt's wife was able to view the video and we discussed his current swallowing status, including aspiration risk and its relationship to overall mentation and dementia.  Mrs. Kowalewski did not want to pursue temporary NG feeds at this time. We agreed the better option may be to start a modified diet of dysphagia 1, thin liquids; provide careful supervision with meals, and hold tray if pt begins coughing.  As mentation improves, we would anticipate improved swallowing, at least to baseline. D/W RN. SLP will follow for safety/diet progression.  SLP Visit Diagnosis Dysphagia, oropharyngeal phase (R13.12) Attention and concentration deficit following -- Frontal lobe and executive function deficit following -- Impact on safety and function Moderate aspiration risk   CHL IP TREATMENT RECOMMENDATION 09/26/2019 Treatment Recommendations Therapy as outlined in treatment plan below   Prognosis 09/26/2019 Prognosis for Safe Diet Advancement Fair Barriers to Reach Goals Cognitive deficits Barriers/Prognosis Comment -- CHL IP DIET RECOMMENDATION 09/26/2019 SLP Diet Recommendations Dysphagia 1 (Puree) solids;Thin liquid Liquid Administration via  Cup;Straw Medication Administration Crushed with puree Compensations Minimize environmental distractions Postural Changes Seated upright at 90 degrees   CHL IP OTHER RECOMMENDATIONS 09/26/2019 Recommended Consults -- Oral Care Recommendations Oral care BID Other Recommendations --   CHL IP FOLLOW UP RECOMMENDATIONS 09/26/2019 Follow up Recommendations Other (comment)   CHL IP FREQUENCY AND DURATION 09/26/2019 Speech Therapy Frequency (ACUTE ONLY) min 2x/week Treatment Duration 2 weeks      CHL IP ORAL PHASE 09/26/2019 Oral Phase Impaired Oral - Pudding Teaspoon -- Oral - Pudding Cup -- Oral - Honey Teaspoon -- Oral - Honey Cup -- Oral - Nectar Teaspoon -- Oral - Nectar Cup -- Oral - Nectar Straw -- Oral - Thin Teaspoon -- Oral - Thin Cup -- Oral - Thin  Straw Decreased bolus cohesion Oral - Puree Weak lingual manipulation;Delayed oral transit;Decreased bolus cohesion Oral - Mech Soft Weak lingual manipulation;Decreased bolus cohesion;Delayed oral transit Oral - Regular -- Oral - Multi-Consistency -- Oral - Pill -- Oral Phase - Comment --  CHL IP PHARYNGEAL PHASE 09/26/2019 Pharyngeal Phase Impaired Pharyngeal- Pudding Teaspoon -- Pharyngeal -- Pharyngeal- Pudding Cup -- Pharyngeal -- Pharyngeal- Honey Teaspoon -- Pharyngeal -- Pharyngeal- Honey Cup -- Pharyngeal -- Pharyngeal- Nectar Teaspoon -- Pharyngeal -- Pharyngeal- Nectar Cup -- Pharyngeal -- Pharyngeal- Nectar Straw -- Pharyngeal -- Pharyngeal- Thin Teaspoon -- Pharyngeal -- Pharyngeal- Thin Cup -- Pharyngeal -- Pharyngeal- Thin Straw Delayed swallow initiation-pyriform sinuses;Reduced pharyngeal peristalsis;Reduced epiglottic inversion;Reduced tongue base retraction;Reduced airway/laryngeal closure;Penetration/Apiration after swallow;Pharyngeal residue - valleculae;Pharyngeal residue - pyriform Pharyngeal Material enters airway, CONTACTS cords and not ejected out Pharyngeal- Puree Reduced pharyngeal peristalsis;Reduced epiglottic inversion;Reduced tongue base  retraction;Pharyngeal residue - valleculae;Pharyngeal residue - pyriform;Delayed swallow initiation-vallecula Pharyngeal Material does not enter airway Pharyngeal- Mechanical Soft Reduced pharyngeal peristalsis;Reduced epiglottic inversion;Reduced tongue base retraction;Pharyngeal residue - valleculae;Pharyngeal residue - pyriform;Delayed swallow initiation-vallecula Pharyngeal Material does not enter airway Pharyngeal- Regular -- Pharyngeal -- Pharyngeal- Multi-consistency -- Pharyngeal -- Pharyngeal- Pill -- Pharyngeal -- Pharyngeal Comment --  CHL IP CERVICAL ESOPHAGEAL PHASE 09/26/2019 Cervical Esophageal Phase (No Data) Pudding Teaspoon -- Pudding Cup -- Honey Teaspoon -- Honey Cup -- Nectar Teaspoon -- Nectar Cup -- Nectar Straw -- Thin Teaspoon -- Thin Cup -- Thin Straw -- Puree -- Mechanical Soft -- Regular -- Multi-consistency -- Pill -- Cervical Esophageal Comment -- Juan Quam Laurice 09/26/2019, 4:15 PM              [place] Ekg Site Rite  Result Date: 09/29/2019 If Site Rite image not attached, placement could not be confirmed due to current cardiac rhythm.     Subjective: "How long will I be there?"  Discharge Exam: Vitals:   09/30/19 0932 09/30/19 1127  BP: 130/63 111/63  Pulse: 80 67  Resp: 16 18  Temp: 98.1 F (36.7 C) 97.8 F (36.6 C)  SpO2: 100% 100%   Vitals:   09/29/19 2318 09/30/19 0333 09/30/19 0932 09/30/19 1127  BP: (!) 166/88 (!) 157/76 130/63 111/63  Pulse: 82 82 80 67  Resp: '10 16 16 18  ' Temp: 97.9 F (36.6 C) 98.2 F (36.8 C) 98.1 F (36.7 C) 97.8 F (36.6 C)  TempSrc: Oral Oral Oral Oral  SpO2: 100% 97% 100% 100%  Weight:      Height:        General: 83 y.o. male resting in bed in NAD Cardiovascular: RRR, +S1, S2, no m/g/r, equal pulses throughout Respiratory: CTABL, no w/r/r, normal WOB GI: BS+, NDNT, no masses noted, no organomegaly noted MSK: No e/c/c Neuro: Alert to name, follows commands Psyc: Appropriate interaction and affect,  calm/cooperative    The results of significant diagnostics from this hospitalization (including imaging, microbiology, ancillary and laboratory) are listed below for reference.     Microbiology: Recent Results (from the past 240 hour(s))  Blood Culture (routine x 2)     Status: Abnormal   Collection Time: 09/25/19 10:00 AM   Specimen: BLOOD  Result Value Ref Range Status   Specimen Description BLOOD RIGHT ANTECUBITAL  Final   Special Requests   Final    BOTTLES DRAWN AEROBIC AND ANAEROBIC Blood Culture results may not be optimal due to an excessive volume of blood received in culture bottles   Culture  Setup Time   Final    IN BOTH  AEROBIC AND ANAEROBIC BOTTLES GRAM POSITIVE COCCI CRITICAL VALUE NOTED.  VALUE IS CONSISTENT WITH PREVIOUSLY REPORTED AND CALLED VALUE. Performed at [facility], [place] [address], [place], [place] [address]    Culture ENTEROCOCCUS FAECALIS (A)  Final   Report Status 09/28/2019 FINAL  Final   Organism ID, Bacteria ENTEROCOCCUS FAECALIS  Final      Susceptibility   Enterococcus faecalis - MIC*    AMPICILLIN <=2 SENSITIVE Sensitive     VANCOMYCIN 1 SENSITIVE Sensitive     GENTAMICIN SYNERGY SENSITIVE Sensitive     * ENTEROCOCCUS FAECALIS  Blood Culture (routine x 2)     Status: Abnormal   Collection Time: 09/25/19 10:06 AM   Specimen: BLOOD  Result Value Ref Range Status   Specimen Description BLOOD BLOOD LEFT WRIST  Final   Special Requests   Final    BOTTLES DRAWN AEROBIC AND ANAEROBIC Blood Culture adequate volume   Culture  Setup Time   Final    IN BOTH AEROBIC AND ANAEROBIC BOTTLES GRAM POSITIVE COCCI CRITICAL RESULT CALLED TO, READ BACK BY AND VERIFIED WITH: PHRMD C PIERCE '@[id number]'  09/26/19 BY S GEZAHEGN    Culture (A)  Final    ENTEROCOCCUS FAECALIS SUSCEPTIBILITIES PERFORMED ON PREVIOUS CULTURE WITHIN THE LAST 5 DAYS. Performed at [facility], [place] [address], [place], [place] [address]    Report Status 09/28/2019 FINAL  Final   Blood Culture ID Panel (Reflexed)     Status: Abnormal   Collection Time: 09/25/19 10:06 AM  Result Value Ref Range Status   Enterococcus species DETECTED (A) NOT DETECTED Final    Comment: CRITICAL RESULT CALLED TO, READ BACK BY AND VERIFIED WITH: PHRMD C PIERCE '@[id number]'  09/26/19 BY S GEZAHEGN    Vancomycin resistance NOT DETECTED NOT DETECTED Final   Listeria monocytogenes NOT DETECTED NOT DETECTED Final   Staphylococcus species NOT DETECTED NOT DETECTED Final   Staphylococcus aureus (BCID) NOT DETECTED NOT DETECTED Final   Streptococcus species NOT DETECTED NOT DETECTED Final   Streptococcus agalactiae NOT DETECTED NOT DETECTED Final   Streptococcus pneumoniae NOT DETECTED NOT DETECTED Final   Streptococcus pyogenes NOT DETECTED NOT DETECTED Final   Acinetobacter baumannii NOT DETECTED NOT DETECTED Final   Enterobacteriaceae species NOT DETECTED NOT DETECTED Final   Enterobacter cloacae complex NOT DETECTED NOT DETECTED Final   Escherichia coli NOT DETECTED NOT DETECTED Final   Klebsiella oxytoca NOT DETECTED NOT DETECTED Final   Klebsiella pneumoniae NOT DETECTED NOT DETECTED Final   Proteus species NOT DETECTED NOT DETECTED Final   Serratia marcescens NOT DETECTED NOT DETECTED Final   Haemophilus influenzae NOT DETECTED NOT DETECTED Final   Neisseria meningitidis NOT DETECTED NOT DETECTED Final   Pseudomonas aeruginosa NOT DETECTED NOT DETECTED Final   Candida albicans NOT DETECTED NOT DETECTED Final   Candida glabrata NOT DETECTED NOT DETECTED Final   Candida krusei NOT DETECTED NOT DETECTED Final   Candida parapsilosis NOT DETECTED NOT DETECTED Final   Candida tropicalis NOT DETECTED NOT DETECTED Final    Comment: Performed at [facility], [address], [place], [place] [address]  Urine culture     Status: Abnormal   Collection Time: 09/25/19 11:34 AM   Specimen: In/Out Cath Urine  Result Value Ref Range Status   Specimen Description IN/OUT CATH URINE  Final    Special Requests   Final    NONE Performed at [facility], [place] [address], [place], [place] [address]  Culture 5,000 COLONIES/mL ENTEROCOCCUS FAECALIS (A)  Final   Report Status 09/27/2019 FINAL  Final   Organism ID, Bacteria ENTEROCOCCUS FAECALIS (A)  Final      Susceptibility   Enterococcus faecalis - MIC*    AMPICILLIN 8 SENSITIVE Sensitive     LEVOFLOXACIN 2 SENSITIVE Sensitive     NITROFURANTOIN <=16 SENSITIVE Sensitive     VANCOMYCIN 1 SENSITIVE Sensitive     * 5,000 COLONIES/mL ENTEROCOCCUS FAECALIS  SARS CORONAVIRUS 2 (TAT 6-24 HRS) Nasopharyngeal Nasopharyngeal Swab     Status: None   Collection Time: 09/25/19  3:41 PM   Specimen: Nasopharyngeal Swab  Result Value Ref Range Status   SARS Coronavirus 2 NEGATIVE NEGATIVE Final    Comment: (NOTE) SARS-CoV-2 target nucleic acids are NOT DETECTED. The SARS-CoV-2 RNA is generally detectable in upper and lower respiratory specimens during the acute phase of infection. Negative results do not preclude SARS-CoV-2 infection, do not rule out co-infections with other pathogens, and should not be used as the sole basis for treatment or other patient management decisions. Negative results must be combined with clinical observations, patient history, and epidemiological information. The expected result is Negative. Fact Sheet for Patients: SugarRoll.be Fact Sheet for Healthcare Providers: https://www.woods-mathews.com/ This test is not yet approved or cleared by the [place] FDA and  has been authorized for detection and/or diagnosis of SARS-CoV-2 by FDA under an Emergency Use Authorization (EUA). This EUA will remain  in effect (meaning this test can be used) for the duration of the COVID-19 declaration under Section 56 4(b)(1) of the Act, 21 U.S.C. section 360bbb-3(b)(1), unless the authorization is terminated or revoked sooner. Performed at [facility], [place]  [address] [place], [place] [address]   Culture, blood (routine x 2)     Status: None (Preliminary result)   Collection Time: 09/27/19  5:17 AM   Specimen: BLOOD  Result Value Ref Range Status   Specimen Description BLOOD RIGHT ANTECUBITAL  Final   Special Requests   Final    BOTTLES DRAWN AEROBIC AND ANAEROBIC Blood Culture adequate volume   Culture   Final    NO GROWTH 3 DAYS Performed at [facility], [place] [address] [place], [place] [address]    Report Status PENDING  Incomplete  Culture, blood (routine x 2)     Status: None (Preliminary result)   Collection Time: 09/27/19  5:20 AM   Specimen: BLOOD RIGHT HAND  Result Value Ref Range Status   Specimen Description BLOOD RIGHT HAND  Final   Special Requests AEROBIC BOTTLE ONLY Blood Culture adequate volume  Final   Culture   Final    NO GROWTH 3 DAYS Performed at [facility], [place] [address] [place], [place] [address]    Report Status PENDING  Incomplete  SARS CORONAVIRUS 2 (TAT 6-24 HRS) Nasopharyngeal Nasopharyngeal Swab     Status: None   Collection Time: 09/29/19 11:26 AM   Specimen: Nasopharyngeal Swab  Result Value Ref Range Status   SARS Coronavirus 2 NEGATIVE NEGATIVE Final    Comment: (NOTE) SARS-CoV-2 target nucleic acids are NOT DETECTED. The SARS-CoV-2 RNA is generally detectable in upper and lower respiratory specimens during the acute phase of infection. Negative results do not preclude SARS-CoV-2 infection, do not rule out co-infections with other pathogens, and should not be used as the sole basis for treatment or other patient management decisions. Negative results must be combined with clinical observations, patient history, and epidemiological information. The expected result  is Negative. Fact Sheet for Patients: SugarRoll.be Fact Sheet for Healthcare Providers: https://www.woods-mathews.com/ This test is not yet approved or cleared by the [place] FDA  and  has been authorized for detection and/or diagnosis of SARS-CoV-2 by FDA under an Emergency Use Authorization (EUA). This EUA will remain  in effect (meaning this test can be used) for the duration of the COVID-19 declaration under Section 56 4(b)(1) of the Act, 21 U.S.C. section 360bbb-3(b)(1), unless the authorization is terminated or revoked sooner. Performed at [facility], [place] [address], [place], [place] [address]      Labs: BNP (last 3 results) No results for input(s): BNP in the last 8760 hours. Basic Metabolic Panel: Recent Labs  Lab 09/25/19 1425 09/26/19 0309 09/27/19 0524 09/28/19 0402 09/29/19 1145 09/30/19 0500  NA  --  130* 132* 138 137 139  K  --  3.5 2.6* 3.4* 4.1 3.9  CL  --  99 96* 103 101 104  CO2  --  19* '22 24 24 24  ' GLUCOSE  --  109* 98 103* 103* 95  BUN  --  '8 12 10 11 9  ' CREATININE  --  0.67 0.70 0.67 0.68 0.61  CALCIUM  --  7.6* 7.5* 7.8* 7.9* 8.1*  MG 2.0  --  2.2 2.4 2.2 2.4  PHOS 1.9*  --  1.8* 1.8* 3.2 3.2   Liver Function Tests: Recent Labs  Lab 09/25/19 1000 09/27/19 0524 09/28/19 0402 09/29/19 1145 09/30/19 0500  AST 17  --   --   --   --   ALT 9  --   --   --   --   ALKPHOS 70  --   --   --   --   BILITOT 0.8  --   --   --   --   PROT 6.8  --   --   --   --   ALBUMIN 3.3* 2.7* 2.5* 2.3* 2.6*   No results for input(s): LIPASE, AMYLASE in the last 168 hours. Recent Labs  Lab 09/25/19 1425  AMMONIA 27   CBC: Recent Labs  Lab 09/25/19 1000 09/25/19 1425 09/26/19 0309 09/27/19 0524 09/28/19 0402  WBC 4.3  --  6.6 4.7 3.1*  NEUTROABS 3.6  --   --  3.5 1.7  HGB 11.9*  --  12.7* 11.5* 10.7*  HCT 36.4* 33.6* 36.4* 33.1* 30.9*  MCV 100.6*  --  96.8 94.3 96.0  PLT 37*  --  31* 33* 50*   Cardiac Enzymes: No results for input(s): CKTOTAL, CKMB, CKMBINDEX, TROPONINI in the last 168 hours. BNP: Invalid input(s): POCBNP CBG: Recent Labs  Lab 09/25/19 0826  GLUCAP 87   D-Dimer No results for input(s):  DDIMER in the last 72 hours. Hgb A1c No results for input(s): HGBA1C in the last 72 hours. Lipid Profile No results for input(s): CHOL, HDL, LDLCALC, TRIG, CHOLHDL, LDLDIRECT in the last 72 hours. Thyroid function studies No results for input(s): TSH, T4TOTAL, T3FREE, THYROIDAB in the last 72 hours.  Invalid input(s): FREET3 Anemia work up No results for input(s): VITAMINB12, FOLATE, FERRITIN, TIBC, IRON, RETICCTPCT in the last 72 hours. Urinalysis    Component Value Date/Time   COLORURINE YELLOW 09/25/2019 [place] 09/25/2019 1135   LABSPEC 1.016 09/25/2019 1135   PHURINE 7.0 09/25/2019 [place] 09/25/2019 1135   [place] (A) 09/25/2019 [place] 09/25/2019 1135   BILIRUBINUR n 10/15/2015 0086  KETONESUR 5 (A) 09/25/2019 1135   PROTEINUR 30 (A) 09/25/2019 1135   UROBILINOGEN 0.2 10/15/2015 0939   UROBILINOGEN 0.2 06/23/2015 1443   NITRITE NEGATIVE 09/25/2019 1135   LEUKOCYTESUR NEGATIVE 09/25/2019 1135   Sepsis Labs Invalid input(s): PROCALCITONIN,  WBC,  LACTICIDVEN Microbiology Recent Results (from the past 240 hour(s))  Blood Culture (routine x 2)     Status: Abnormal   Collection Time: 09/25/19 10:00 AM   Specimen: BLOOD  Result Value Ref Range Status   Specimen Description BLOOD RIGHT ANTECUBITAL  Final   Special Requests   Final    BOTTLES DRAWN AEROBIC AND ANAEROBIC Blood Culture results may not be optimal due to an excessive volume of blood received in culture bottles   Culture  Setup Time   Final    IN BOTH AEROBIC AND ANAEROBIC BOTTLES GRAM POSITIVE COCCI CRITICAL VALUE NOTED.  VALUE IS CONSISTENT WITH PREVIOUSLY REPORTED AND CALLED VALUE. Performed at [facility], [place] [address], [place], [place] [address]    Culture ENTEROCOCCUS FAECALIS (A)  Final   Report Status 09/28/2019 FINAL  Final   Organism ID, Bacteria ENTEROCOCCUS FAECALIS  Final      Susceptibility   Enterococcus faecalis - MIC*     AMPICILLIN <=2 SENSITIVE Sensitive     VANCOMYCIN 1 SENSITIVE Sensitive     GENTAMICIN SYNERGY SENSITIVE Sensitive     * ENTEROCOCCUS FAECALIS  Blood Culture (routine x 2)     Status: Abnormal   Collection Time: 09/25/19 10:06 AM   Specimen: BLOOD  Result Value Ref Range Status   Specimen Description BLOOD BLOOD LEFT WRIST  Final   Special Requests   Final    BOTTLES DRAWN AEROBIC AND ANAEROBIC Blood Culture adequate volume   Culture  Setup Time   Final    IN BOTH AEROBIC AND ANAEROBIC BOTTLES GRAM POSITIVE COCCI CRITICAL RESULT CALLED TO, READ BACK BY AND VERIFIED WITH: PHRMD C PIERCE '@[id number]'  09/26/19 BY S GEZAHEGN    Culture (A)  Final    ENTEROCOCCUS FAECALIS SUSCEPTIBILITIES PERFORMED ON PREVIOUS CULTURE WITHIN THE LAST 5 DAYS. Performed at [facility], [place] [address], [place], [place] [address]    Report Status 09/28/2019 FINAL  Final  Blood Culture ID Panel (Reflexed)     Status: Abnormal   Collection Time: 09/25/19 10:06 AM  Result Value Ref Range Status   Enterococcus species DETECTED (A) NOT DETECTED Final    Comment: CRITICAL RESULT CALLED TO, READ BACK BY AND VERIFIED WITH: PHRMD C PIERCE '@[id number]'  09/26/19 BY S GEZAHEGN    Vancomycin resistance NOT DETECTED NOT DETECTED Final   Listeria monocytogenes NOT DETECTED NOT DETECTED Final   Staphylococcus species NOT DETECTED NOT DETECTED Final   Staphylococcus aureus (BCID) NOT DETECTED NOT DETECTED Final   Streptococcus species NOT DETECTED NOT DETECTED Final   Streptococcus agalactiae NOT DETECTED NOT DETECTED Final   Streptococcus pneumoniae NOT DETECTED NOT DETECTED Final   Streptococcus pyogenes NOT DETECTED NOT DETECTED Final   Acinetobacter baumannii NOT DETECTED NOT DETECTED Final   Enterobacteriaceae species NOT DETECTED NOT DETECTED Final   Enterobacter cloacae complex NOT DETECTED NOT DETECTED Final   Escherichia coli NOT DETECTED NOT DETECTED Final   Klebsiella oxytoca NOT DETECTED NOT DETECTED Final    Klebsiella pneumoniae NOT DETECTED NOT DETECTED Final   Proteus species NOT DETECTED NOT DETECTED Final   Serratia marcescens NOT DETECTED NOT DETECTED Final   Haemophilus influenzae NOT DETECTED NOT DETECTED Final   Neisseria meningitidis  NOT DETECTED NOT DETECTED Final   Pseudomonas aeruginosa NOT DETECTED NOT DETECTED Final   Candida albicans NOT DETECTED NOT DETECTED Final   Candida glabrata NOT DETECTED NOT DETECTED Final   Candida krusei NOT DETECTED NOT DETECTED Final   Candida parapsilosis NOT DETECTED NOT DETECTED Final   Candida tropicalis NOT DETECTED NOT DETECTED Final    Comment: Performed at [facility], [place] [address], [place], [place] [address]  Urine culture     Status: Abnormal   Collection Time: 09/25/19 11:34 AM   Specimen: In/Out Cath Urine  Result Value Ref Range Status   Specimen Description IN/OUT CATH URINE  Final   Special Requests   Final    NONE Performed at [facility], [place] [address] [place], [place] [address]    Culture 5,000 COLONIES/mL ENTEROCOCCUS FAECALIS (A)  Final   Report Status 09/27/2019 FINAL  Final   Organism ID, Bacteria ENTEROCOCCUS FAECALIS (A)  Final      Susceptibility   Enterococcus faecalis - MIC*    AMPICILLIN 8 SENSITIVE Sensitive     LEVOFLOXACIN 2 SENSITIVE Sensitive     NITROFURANTOIN <=16 SENSITIVE Sensitive     VANCOMYCIN 1 SENSITIVE Sensitive     * 5,000 COLONIES/mL ENTEROCOCCUS FAECALIS  SARS CORONAVIRUS 2 (TAT 6-24 HRS) Nasopharyngeal Nasopharyngeal Swab     Status: None   Collection Time: 09/25/19  3:41 PM   Specimen: Nasopharyngeal Swab  Result Value Ref Range Status   SARS Coronavirus 2 NEGATIVE NEGATIVE Final    Comment: (NOTE) SARS-CoV-2 target nucleic acids are NOT DETECTED. The SARS-CoV-2 RNA is generally detectable in upper and lower respiratory specimens during the acute phase of infection. Negative results do not preclude SARS-CoV-2 infection, do not rule out co-infections with other  pathogens, and should not be used as the sole basis for treatment or other patient management decisions. Negative results must be combined with clinical observations, patient history, and epidemiological information. The expected result is Negative. Fact Sheet for Patients: SugarRoll.be Fact Sheet for Healthcare Providers: https://www.woods-mathews.com/ This test is not yet approved or cleared by the [place] FDA and  has been authorized for detection and/or diagnosis of SARS-CoV-2 by FDA under an Emergency Use Authorization (EUA). This EUA will remain  in effect (meaning this test can be used) for the duration of the COVID-19 declaration under Section 56 4(b)(1) of the Act, 21 U.S.C. section 360bbb-3(b)(1), unless the authorization is terminated or revoked sooner. Performed at [facility], [place] [address], [place], [place] [address]   Culture, blood (routine x 2)     Status: None (Preliminary result)   Collection Time: 09/27/19  5:17 AM   Specimen: BLOOD  Result Value Ref Range Status   Specimen Description BLOOD RIGHT ANTECUBITAL  Final   Special Requests   Final    BOTTLES DRAWN AEROBIC AND ANAEROBIC Blood Culture adequate volume   Culture   Final    NO GROWTH 3 DAYS Performed at [facility], [place] [address] [place], [place] [address]    Report Status PENDING  Incomplete  Culture, blood (routine x 2)     Status: None (Preliminary result)   Collection Time: 09/27/19  5:20 AM   Specimen: BLOOD RIGHT HAND  Result Value Ref Range Status   Specimen Description BLOOD RIGHT HAND  Final   Special Requests AEROBIC BOTTLE ONLY Blood Culture adequate volume  Final   Culture   Final    NO GROWTH 3 DAYS  Performed at [facility], [place] [address] [place], [place] [address]    Report Status PENDING  Incomplete  SARS CORONAVIRUS 2 (TAT 6-24 HRS) Nasopharyngeal Nasopharyngeal Swab     Status: None   Collection Time:  09/29/19 11:26 AM   Specimen: Nasopharyngeal Swab  Result Value Ref Range Status   SARS Coronavirus 2 NEGATIVE NEGATIVE Final    Comment: (NOTE) SARS-CoV-2 target nucleic acids are NOT DETECTED. The SARS-CoV-2 RNA is generally detectable in upper and lower respiratory specimens during the acute phase of infection. Negative results do not preclude SARS-CoV-2 infection, do not rule out co-infections with other pathogens, and should not be used as the sole basis for treatment or other patient management decisions. Negative results must be combined with clinical observations, patient history, and epidemiological information. The expected result is Negative. Fact Sheet for Patients: SugarRoll.be Fact Sheet for Healthcare Providers: https://www.woods-mathews.com/ This test is not yet approved or cleared by the [place] FDA and  has been authorized for detection and/or diagnosis of SARS-CoV-2 by FDA under an Emergency Use Authorization (EUA). This EUA will remain  in effect (meaning this test can be used) for the duration of the COVID-19 declaration under Section 56 4(b)(1) of the Act, 21 U.S.C. section 360bbb-3(b)(1), unless the authorization is terminated or revoked sooner. Performed at [facility], [place] [address], [place], [place] [address]      Time coordinating discharge: 35 minutes  SIGNED:   Jonnie Finner, DO  [facility] 09/30/2019, 12:35 PM Pager   If 7PM-7AM, please contact night-coverage [email or web address] Password [email or web address]

## 2019-09-30 NOTE — Consult Note (Signed)
   [facility] CM Inpatient Consult   09/30/2019  JASER FULLEN 1936-03-03 [id number]    Patientreviewed for 25% high risk score for readmission and hospitalizations; and to check for [place] Managementneeds under her Medicare/ [employer] A[employer] plan.  Patient was previously outreached by [facility] RN CM coordinator for [facility] follow-up calls and transition of care.   Patient was admitted with Altered mental status:Sepsis secondary to Enterococcus UTI/Bacteremia.  Chart review reveals PT/ OT recommendation for disposition is skilled nursing facility. Transition of care SW note states that patient will transition to SNF [facility]).   No identifiable [facility] Care Management needs at this time, aspatient's care will be met at the skilled level of care.  [facility] post acute RN coordinator made aware of patient's disposition for any [facility] Care management post discharge follow up of needs.   For questions, please call:  Edwena Felty A. Nansi [place], BSN, RN-BC [facility] Liaison Cell: [phone]

## 2019-09-30 NOTE — Progress Notes (Signed)
Patient being discharged via [facility] to [facility]. Report given to nurse at facility. All belongings with patient. Leaving unit.

## 2019-10-01 DIAGNOSIS — I330 Acute and subacute infective endocarditis: Secondary | ICD-10-CM | POA: Diagnosis not present

## 2019-10-01 DIAGNOSIS — I10 Essential (primary) hypertension: Secondary | ICD-10-CM | POA: Diagnosis not present

## 2019-10-01 DIAGNOSIS — C61 Malignant neoplasm of prostate: Secondary | ICD-10-CM | POA: Diagnosis not present

## 2019-10-01 DIAGNOSIS — N390 Urinary tract infection, site not specified: Secondary | ICD-10-CM | POA: Diagnosis not present

## 2019-10-02 LAB — CULTURE, BLOOD (ROUTINE X 2)
Culture: NO GROWTH
Culture: NO GROWTH
Special Requests: ADEQUATE
Special Requests: ADEQUATE

## 2019-10-05 DIAGNOSIS — I10 Essential (primary) hypertension: Secondary | ICD-10-CM | POA: Diagnosis not present

## 2019-10-05 DIAGNOSIS — A419 Sepsis, unspecified organism: Secondary | ICD-10-CM | POA: Diagnosis not present

## 2019-10-05 DIAGNOSIS — I330 Acute and subacute infective endocarditis: Secondary | ICD-10-CM | POA: Diagnosis not present

## 2019-10-05 DIAGNOSIS — C61 Malignant neoplasm of prostate: Secondary | ICD-10-CM | POA: Diagnosis not present

## 2019-10-05 DIAGNOSIS — G9340 Encephalopathy, unspecified: Secondary | ICD-10-CM | POA: Diagnosis not present

## 2019-10-05 DIAGNOSIS — R1319 Other dysphagia: Secondary | ICD-10-CM | POA: Diagnosis not present

## 2019-10-05 DIAGNOSIS — N390 Urinary tract infection, site not specified: Secondary | ICD-10-CM | POA: Diagnosis not present

## 2019-10-05 DIAGNOSIS — F322 Major depressive disorder, single episode, severe without psychotic features: Secondary | ICD-10-CM | POA: Diagnosis not present

## 2019-10-05 DIAGNOSIS — A4181 Sepsis due to Enterococcus: Secondary | ICD-10-CM | POA: Diagnosis not present

## 2019-10-06 ENCOUNTER — Other Ambulatory Visit: Payer: Self-pay | Admitting: *Deleted

## 2019-10-06 NOTE — Patient Outreach (Addendum)
Member assessed for potential [facility] Care Management needs as a benefit of  [place] [employer].  Member is currently receiving skilled therapy at [facility] SNF.  Will continue to follow for progression, disposition plans, and for potential [facility] Care Management needs.  Will continue to collaborate with [facility] UM team, community partners, and facility while Mr. Wragg resides in SNF.   Addendum: Also confirmed with [facility] (community based interdisciplinary team) that they will attempt outreach to member for ongoing SNF dc needs.      Marthenia Rolling, MSN-Ed, RN,BSN [place] Acute Care Coordinator [phone] [facility]) [phone]  (Toll free office)

## 2019-10-07 DIAGNOSIS — A4181 Sepsis due to Enterococcus: Secondary | ICD-10-CM | POA: Diagnosis not present

## 2019-10-07 DIAGNOSIS — D693 Immune thrombocytopenic purpura: Secondary | ICD-10-CM | POA: Diagnosis not present

## 2019-10-07 DIAGNOSIS — I330 Acute and subacute infective endocarditis: Secondary | ICD-10-CM | POA: Diagnosis not present

## 2019-10-07 DIAGNOSIS — I10 Essential (primary) hypertension: Secondary | ICD-10-CM | POA: Diagnosis not present

## 2019-10-08 ENCOUNTER — Other Ambulatory Visit: Payer: Self-pay | Admitting: *Deleted

## 2019-10-08 NOTE — Patient Outreach (Signed)
Member assessed for potential [facility] Care Management needs as a benefit of  [place] [employer].  Member is currently receiving skilled therapy at [facility] SNF.  Member discussed in weekly telephonic IDT meeting with facility staff, [facility] UM team, and writer.  Facility reports member is from home with wife. 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  Will continue to follow for disposition plans and progression. [facility] will plan outreach upon SNF discharge.    Marthenia Rolling, MSN-Ed, RN,BSN [place] Acute Care Coordinator [phone] [facility]) [phone]  (Toll free office)

## 2019-10-14 DIAGNOSIS — Z20828 Contact with and (suspected) exposure to other viral communicable diseases: Secondary | ICD-10-CM | POA: Diagnosis not present

## 2019-10-15 DIAGNOSIS — C61 Malignant neoplasm of prostate: Secondary | ICD-10-CM | POA: Diagnosis not present

## 2019-10-15 DIAGNOSIS — C7951 Secondary malignant neoplasm of bone: Secondary | ICD-10-CM | POA: Diagnosis not present

## 2019-10-15 DIAGNOSIS — A4181 Sepsis due to Enterococcus: Secondary | ICD-10-CM | POA: Diagnosis not present

## 2019-10-15 DIAGNOSIS — I330 Acute and subacute infective endocarditis: Secondary | ICD-10-CM | POA: Diagnosis not present

## 2019-10-18 DIAGNOSIS — F329 Major depressive disorder, single episode, unspecified: Secondary | ICD-10-CM | POA: Diagnosis not present

## 2019-10-18 DIAGNOSIS — A4181 Sepsis due to Enterococcus: Secondary | ICD-10-CM | POA: Diagnosis not present

## 2019-10-18 DIAGNOSIS — I330 Acute and subacute infective endocarditis: Secondary | ICD-10-CM | POA: Diagnosis not present

## 2019-10-18 DIAGNOSIS — R1319 Other dysphagia: Secondary | ICD-10-CM | POA: Diagnosis not present

## 2019-10-18 DIAGNOSIS — I10 Essential (primary) hypertension: Secondary | ICD-10-CM | POA: Diagnosis not present

## 2019-10-18 DIAGNOSIS — N390 Urinary tract infection, site not specified: Secondary | ICD-10-CM | POA: Diagnosis not present

## 2019-10-22 DIAGNOSIS — A4181 Sepsis due to Enterococcus: Secondary | ICD-10-CM | POA: Diagnosis not present

## 2019-10-22 DIAGNOSIS — Q230 Congenital stenosis of aortic valve: Secondary | ICD-10-CM | POA: Diagnosis not present

## 2019-10-22 DIAGNOSIS — I330 Acute and subacute infective endocarditis: Secondary | ICD-10-CM | POA: Diagnosis not present

## 2019-10-22 DIAGNOSIS — D693 Immune thrombocytopenic purpura: Secondary | ICD-10-CM | POA: Diagnosis not present

## 2019-10-24 DIAGNOSIS — A419 Sepsis, unspecified organism: Secondary | ICD-10-CM | POA: Diagnosis not present

## 2019-10-24 DIAGNOSIS — G9340 Encephalopathy, unspecified: Secondary | ICD-10-CM | POA: Diagnosis not present

## 2019-10-24 DIAGNOSIS — C61 Malignant neoplasm of prostate: Secondary | ICD-10-CM | POA: Diagnosis not present

## 2019-10-24 DIAGNOSIS — F322 Major depressive disorder, single episode, severe without psychotic features: Secondary | ICD-10-CM | POA: Diagnosis not present

## 2019-10-26 DIAGNOSIS — N390 Urinary tract infection, site not specified: Secondary | ICD-10-CM | POA: Diagnosis not present

## 2019-10-26 DIAGNOSIS — I10 Essential (primary) hypertension: Secondary | ICD-10-CM | POA: Diagnosis not present

## 2019-10-26 DIAGNOSIS — R1319 Other dysphagia: Secondary | ICD-10-CM | POA: Diagnosis not present

## 2019-10-26 DIAGNOSIS — I330 Acute and subacute infective endocarditis: Secondary | ICD-10-CM | POA: Diagnosis not present

## 2019-10-26 DIAGNOSIS — A4181 Sepsis due to Enterococcus: Secondary | ICD-10-CM | POA: Diagnosis not present

## 2019-10-26 DIAGNOSIS — F329 Major depressive disorder, single episode, unspecified: Secondary | ICD-10-CM | POA: Diagnosis not present

## 2019-10-27 ENCOUNTER — Ambulatory Visit (INDEPENDENT_AMBULATORY_CARE_PROVIDER_SITE_OTHER): Payer: Medicare Other | Admitting: Internal Medicine

## 2019-10-27 ENCOUNTER — Encounter: Payer: Self-pay | Admitting: Internal Medicine

## 2019-10-27 ENCOUNTER — Other Ambulatory Visit: Payer: Self-pay

## 2019-10-27 VITALS — BP 132/78 | HR 76 | Wt 141.0 lb

## 2019-10-27 DIAGNOSIS — B952 Enterococcus as the cause of diseases classified elsewhere: Secondary | ICD-10-CM | POA: Diagnosis not present

## 2019-10-27 DIAGNOSIS — I209 Angina pectoris, unspecified: Secondary | ICD-10-CM

## 2019-10-27 DIAGNOSIS — Z452 Encounter for adjustment and management of vascular access device: Secondary | ICD-10-CM | POA: Diagnosis not present

## 2019-10-27 DIAGNOSIS — R7881 Bacteremia: Secondary | ICD-10-CM

## 2019-10-27 DIAGNOSIS — Z952 Presence of prosthetic heart valve: Secondary | ICD-10-CM

## 2019-10-27 MED ORDER — AMOXICILLIN 500 MG PO TABS: 500.0000 mg | ORAL_TABLET | Freq: Two times a day (BID) | ORAL | 11 refills | Status: DC

## 2019-10-27 NOTE — Assessment & Plan Note (Signed)
With bacteremia twice and inability to get a TEE, I will put him on suppressive amoxicillin indefinitely.

## 2019-10-27 NOTE — Assessment & Plan Note (Signed)
No issues with the picc line, working well.   This can be removed after his last dose on 12/24.

## 2019-10-27 NOTE — Assessment & Plan Note (Signed)
I discussed the plan with the patient and his wife. He is scheduled to complete the antibiotics on 12/6 but I think it is reasonable that he goes through 12/24 in the afternoon and stops at that time.  He can then transition to oral suppressive therapy with amoxicillin 500 mg twice a day indefinitely. He will follow up with me in about 4 weeks on amoxicillin

## 2019-10-27 NOTE — Progress Notes (Signed)
   Subjective:    Patient ID: Corey Oneill, male    DOB: 1936-05-23, 83 y.o.   MRN: [id number]  HPI Here for hsfu He developed Enterococcal bacteremia in October, history of prosthetic aortic valve, and TTE without concerns for endocarditis. TEE was attempted but unsuccessful.  He was treated for 4 weeks with IV ampicillin.  Unfortunately, the bacteremia returned and he is now on dual beta lactam therapy with ceftriaxone and ampicillin for 6 weeks through 12/26.  He main complaint is the rash on his feet c/w the known bullous pemphigoid.  He is tolerating the antibiotics with no new issues.  Here with his wife.     Review of Systems  Constitutional: Negative for chills, fatigue and fever.  Gastrointestinal: Negative for diarrhea and nausea.  Skin: Positive for rash.       Objective:   Physical Exam Constitutional:      Appearance: Normal appearance.  Eyes:     General: No scleral icterus. Cardiovascular:     Rate and Rhythm: Normal rate and regular rhythm.     Heart sounds: No murmur.  Pulmonary:     Effort: Pulmonary effort is normal.  Skin:    Findings: Rash present.     Comments: Right foot with ecchymotic area   Neurological:     Mental Status: He is alert.   SH: at [place]:

## 2019-10-28 DIAGNOSIS — A4181 Sepsis due to Enterococcus: Secondary | ICD-10-CM | POA: Diagnosis not present

## 2019-10-28 DIAGNOSIS — L120 Bullous pemphigoid: Secondary | ICD-10-CM | POA: Diagnosis not present

## 2019-10-28 DIAGNOSIS — I10 Essential (primary) hypertension: Secondary | ICD-10-CM | POA: Diagnosis not present

## 2019-10-28 DIAGNOSIS — I330 Acute and subacute infective endocarditis: Secondary | ICD-10-CM | POA: Diagnosis not present

## 2019-10-31 ENCOUNTER — Telehealth: Payer: Self-pay

## 2019-10-31 NOTE — Telephone Encounter (Signed)
Yes, last dose can be an earlier dose and discharge on 12/24.  thanks

## 2019-10-31 NOTE — Telephone Encounter (Signed)
Patient's wife called office today stating Ritta Slot orders are incorrect. States that in appointment it was agreed patient would be able to be discharged on 12/24 after last injection. Patient's wife was told that Mr. Fjeld would be receiving last dose on 12/24 at 8pm. Patient would be discharged on 12/25. Would like to know if office would be able to send new orders to have last injection done earlier in the day so patient could be released same day. Will forward message to Md. [place]

## 2019-11-04 NOTE — Telephone Encounter (Signed)
Attempted to contact nurse at [facility] to have patient's last injection of antibiotics done early to be discharged on 12/24. Left voicemail with nurses station unit one.  [phone] Aundria Rud, CMA

## 2019-11-05 ENCOUNTER — Telehealth: Payer: Self-pay

## 2019-11-05 NOTE — Telephone Encounter (Signed)
Nurse is calling from [facility] egarding last dose of IV medications for 11-06-2019. His last dose is scheduled for 8 pm.  Patient's wife would like to have this medication given earlier so he can be discharged on 11-06-2019. She will not have transportation on 11-07-2019.  Per Previous note Dr Linus Salmons gave permission for dose to be changed to earlier date.  The nurse needs order to record exact time dose should be given.  Nurse was given pager number for Dr Johnnye Sima who is on call for phone calls.    Laverle Patter, RN

## 2019-11-10 ENCOUNTER — Telehealth: Payer: Self-pay

## 2019-11-10 NOTE — Telephone Encounter (Signed)
Pt daughter called and advise of father health is decline . Pt has been in rehab since 09-30-19. He was discharged christmas eve. Pt was discharged on amoxicillin BID. Pt will follow up with Dr. Linus Salmons 11-24-19. Pt is unable to walk right now. Pt daughter says he is in really bad shape. . Daughter stated that home health will be coming out tomorrow. Notes from them were requested from me. [place]

## 2019-11-12 ENCOUNTER — Telehealth: Payer: Self-pay

## 2019-11-12 NOTE — Telephone Encounter (Signed)
Pt daughter called back and made her an appt to discuss her father. She is on the HIPPA from 2015. [place]

## 2019-11-12 NOTE — Telephone Encounter (Signed)
Keep [place] informed

## 2019-11-12 NOTE — Telephone Encounter (Signed)
Daughter called to advise Nurse from [facility] came and pt was also visited by PT . He will continue once a week for now. The have determined that he is in need for more in home health care. She also advised his feet are in bad shape and she reached out to derma and podiatry Dr. Wilson Singer. Case worker will be out today to help with pt getting medicaid to help pay for in home nursing . Please advise if you would like to have a virtual appt with daughter and Corey Oneill . [place]

## 2019-11-14 ENCOUNTER — Observation Stay (HOSPITAL_COMMUNITY): Payer: Medicare Other

## 2019-11-14 ENCOUNTER — Emergency Department (HOSPITAL_COMMUNITY): Payer: Medicare Other

## 2019-11-14 ENCOUNTER — Inpatient Hospital Stay (HOSPITAL_COMMUNITY)
Admission: EM | Admit: 2019-11-14 | Discharge: 2019-11-15 | DRG: 069 | Disposition: A | Discharge status: Home / Self Care | Payer: Medicare Other | Attending: Internal Medicine | Admitting: Internal Medicine

## 2019-11-14 ENCOUNTER — Encounter (HOSPITAL_COMMUNITY): Payer: Self-pay | Admitting: Family Medicine

## 2019-11-14 ENCOUNTER — Other Ambulatory Visit: Payer: Self-pay

## 2019-11-14 DIAGNOSIS — R4789 Other speech disturbances: Secondary | ICD-10-CM | POA: Diagnosis present

## 2019-11-14 DIAGNOSIS — C7951 Secondary malignant neoplasm of bone: Secondary | ICD-10-CM | POA: Diagnosis present

## 2019-11-14 DIAGNOSIS — Z86006 Personal history of melanoma in-situ: Secondary | ICD-10-CM

## 2019-11-14 DIAGNOSIS — R29818 Other symptoms and signs involving the nervous system: Secondary | ICD-10-CM | POA: Diagnosis not present

## 2019-11-14 DIAGNOSIS — H8110 Benign paroxysmal vertigo, unspecified ear: Secondary | ICD-10-CM | POA: Diagnosis present

## 2019-11-14 DIAGNOSIS — R471 Dysarthria and anarthria: Secondary | ICD-10-CM | POA: Diagnosis present

## 2019-11-14 DIAGNOSIS — R2990 Unspecified symptoms and signs involving the nervous system: Secondary | ICD-10-CM

## 2019-11-14 DIAGNOSIS — E785 Hyperlipidemia, unspecified: Secondary | ICD-10-CM | POA: Diagnosis present

## 2019-11-14 DIAGNOSIS — R4781 Slurred speech: Secondary | ICD-10-CM | POA: Diagnosis not present

## 2019-11-14 DIAGNOSIS — L8990 Pressure ulcer of unspecified site, unspecified stage: Secondary | ICD-10-CM | POA: Diagnosis present

## 2019-11-14 DIAGNOSIS — C61 Malignant neoplasm of prostate: Secondary | ICD-10-CM | POA: Diagnosis not present

## 2019-11-14 DIAGNOSIS — I6523 Occlusion and stenosis of bilateral carotid arteries: Secondary | ICD-10-CM | POA: Diagnosis not present

## 2019-11-14 DIAGNOSIS — Z818 Family history of other mental and behavioral disorders: Secondary | ICD-10-CM

## 2019-11-14 DIAGNOSIS — Z66 Do not resuscitate: Secondary | ICD-10-CM | POA: Diagnosis not present

## 2019-11-14 DIAGNOSIS — Z87891 Personal history of nicotine dependence: Secondary | ICD-10-CM

## 2019-11-14 DIAGNOSIS — D693 Immune thrombocytopenic purpura: Secondary | ICD-10-CM | POA: Diagnosis not present

## 2019-11-14 DIAGNOSIS — Z888 Allergy status to other drugs, medicaments and biological substances status: Secondary | ICD-10-CM

## 2019-11-14 DIAGNOSIS — Z85820 Personal history of malignant melanoma of skin: Secondary | ICD-10-CM

## 2019-11-14 DIAGNOSIS — Z952 Presence of prosthetic heart valve: Secondary | ICD-10-CM

## 2019-11-14 DIAGNOSIS — Z03818 Encounter for observation for suspected exposure to other biological agents ruled out: Secondary | ICD-10-CM | POA: Diagnosis not present

## 2019-11-14 DIAGNOSIS — R7881 Bacteremia: Secondary | ICD-10-CM | POA: Diagnosis not present

## 2019-11-14 DIAGNOSIS — R4701 Aphasia: Secondary | ICD-10-CM | POA: Diagnosis not present

## 2019-11-14 DIAGNOSIS — Z20822 Contact with and (suspected) exposure to covid-19: Secondary | ICD-10-CM | POA: Diagnosis present

## 2019-11-14 DIAGNOSIS — B952 Enterococcus as the cause of diseases classified elsewhere: Secondary | ICD-10-CM | POA: Diagnosis present

## 2019-11-14 DIAGNOSIS — R9431 Abnormal electrocardiogram [ECG] [EKG]: Secondary | ICD-10-CM | POA: Diagnosis not present

## 2019-11-14 DIAGNOSIS — I700 Atherosclerosis of aorta: Secondary | ICD-10-CM | POA: Diagnosis present

## 2019-11-14 DIAGNOSIS — R29810 Facial weakness: Secondary | ICD-10-CM | POA: Diagnosis not present

## 2019-11-14 DIAGNOSIS — I6502 Occlusion and stenosis of left vertebral artery: Secondary | ICD-10-CM | POA: Diagnosis not present

## 2019-11-14 DIAGNOSIS — Z807 Family history of other malignant neoplasms of lymphoid, hematopoietic and related tissues: Secondary | ICD-10-CM

## 2019-11-14 DIAGNOSIS — G92 Toxic encephalopathy: Secondary | ICD-10-CM | POA: Diagnosis not present

## 2019-11-14 DIAGNOSIS — L120 Bullous pemphigoid: Secondary | ICD-10-CM | POA: Diagnosis not present

## 2019-11-14 DIAGNOSIS — I2510 Atherosclerotic heart disease of native coronary artery without angina pectoris: Secondary | ICD-10-CM | POA: Diagnosis present

## 2019-11-14 DIAGNOSIS — K219 Gastro-esophageal reflux disease without esophagitis: Secondary | ICD-10-CM | POA: Diagnosis present

## 2019-11-14 DIAGNOSIS — G459 Transient cerebral ischemic attack, unspecified: Secondary | ICD-10-CM | POA: Diagnosis not present

## 2019-11-14 DIAGNOSIS — Z7982 Long term (current) use of aspirin: Secondary | ICD-10-CM

## 2019-11-14 DIAGNOSIS — Z808 Family history of malignant neoplasm of other organs or systems: Secondary | ICD-10-CM

## 2019-11-14 DIAGNOSIS — Z923 Personal history of irradiation: Secondary | ICD-10-CM

## 2019-11-14 DIAGNOSIS — I25119 Atherosclerotic heart disease of native coronary artery with unspecified angina pectoris: Secondary | ICD-10-CM | POA: Diagnosis not present

## 2019-11-14 DIAGNOSIS — R531 Weakness: Secondary | ICD-10-CM | POA: Diagnosis not present

## 2019-11-14 DIAGNOSIS — I639 Cerebral infarction, unspecified: Secondary | ICD-10-CM | POA: Diagnosis not present

## 2019-11-14 LAB — CBC
HCT: 33.2 % — ABNORMAL LOW (ref 39.0–52.0)
Hemoglobin: 11.1 g/dL — ABNORMAL LOW (ref 13.0–17.0)
MCH: 33 pg (ref 26.0–34.0)
MCHC: 33.4 g/dL (ref 30.0–36.0)
MCV: 98.8 fL (ref 80.0–100.0)
Platelets: 69 10*3/uL — ABNORMAL LOW (ref 150–400)
RBC: 3.36 MIL/uL — ABNORMAL LOW (ref 4.22–5.81)
RDW: 14.3 % (ref 11.5–15.5)
WBC: 4.2 10*3/uL (ref 4.0–10.5)
nRBC: 0 % (ref 0.0–0.2)

## 2019-11-14 LAB — I-STAT CHEM 8, ED
BUN: 18 mg/dL (ref 8–23)
Calcium, Ion: 1.19 mmol/L (ref 1.15–1.40)
Chloride: 98 mmol/L (ref 98–111)
Creatinine, Ser: 0.8 mg/dL (ref 0.61–1.24)
Glucose, Bld: 107 mg/dL — ABNORMAL HIGH (ref 70–99)
HCT: 33 % — ABNORMAL LOW (ref 39.0–52.0)
Hemoglobin: 11.2 g/dL — ABNORMAL LOW (ref 13.0–17.0)
Potassium: 4 mmol/L (ref 3.5–5.1)
Sodium: 137 mmol/L (ref 135–145)
TCO2: 30 mmol/L (ref 22–32)

## 2019-11-14 LAB — PROTIME-INR
INR: 1.1 (ref 0.8–1.2)
Prothrombin Time: 14.6 seconds (ref 11.4–15.2)

## 2019-11-14 LAB — DIFFERENTIAL
Abs Immature Granulocytes: 0.01 10*3/uL (ref 0.00–0.07)
Basophils Absolute: 0 10*3/uL (ref 0.0–0.1)
Basophils Relative: 1 %
Eosinophils Absolute: 0 10*3/uL (ref 0.0–0.5)
Eosinophils Relative: 1 %
Immature Granulocytes: 0 %
Lymphocytes Relative: 28 %
Lymphs Abs: 1.2 10*3/uL (ref 0.7–4.0)
Monocytes Absolute: 0.3 10*3/uL (ref 0.1–1.0)
Monocytes Relative: 8 %
Neutro Abs: 2.6 10*3/uL (ref 1.7–7.7)
Neutrophils Relative %: 62 %

## 2019-11-14 LAB — COMPREHENSIVE METABOLIC PANEL
ALT: 9 U/L (ref 0–44)
AST: 17 U/L (ref 15–41)
Albumin: 3.1 g/dL — ABNORMAL LOW (ref 3.5–5.0)
Alkaline Phosphatase: 72 U/L (ref 38–126)
Anion gap: 12 (ref 5–15)
BUN: 16 mg/dL (ref 8–23)
CO2: 23 mmol/L (ref 22–32)
Calcium: 8.8 mg/dL — ABNORMAL LOW (ref 8.9–10.3)
Chloride: 101 mmol/L (ref 98–111)
Creatinine, Ser: 0.85 mg/dL (ref 0.61–1.24)
GFR calc Af Amer: >60 mL/min (ref >60)
GFR calc non Af Amer: >60 mL/min (ref >60)
Glucose, Bld: 110 mg/dL — ABNORMAL HIGH (ref 70–99)
Potassium: 4 mmol/L (ref 3.5–5.1)
Sodium: 136 mmol/L (ref 135–145)
Total Bilirubin: 0.5 mg/dL (ref 0.3–1.2)
Total Protein: 6.3 g/dL — ABNORMAL LOW (ref 6.5–8.1)

## 2019-11-14 LAB — CBG MONITORING, ED: Glucose-Capillary: 94 mg/dL (ref 70–99)

## 2019-11-14 LAB — POC SARS CORONAVIRUS 2 AG -  ED: SARS Coronavirus 2 Ag: NEGATIVE

## 2019-11-14 LAB — APTT: aPTT: 40 seconds — ABNORMAL HIGH (ref 24–36)

## 2019-11-14 MED ORDER — AMOXICILLIN 500 MG PO CAPS
500.0000 mg | ORAL_CAPSULE | Freq: Two times a day (BID) | ORAL | Status: DC
  Administered 2019-11-15 (×2): 500 mg via ORAL
  Filled 2019-11-14 (×3): qty 1

## 2019-11-14 MED ORDER — SODIUM CHLORIDE 0.9% FLUSH
3.0000 mL | Freq: Once | INTRAVENOUS | Status: AC
  Administered 2019-11-14: 3 mL via INTRAVENOUS

## 2019-11-14 MED ORDER — ROSUVASTATIN CALCIUM 5 MG PO TABS: 10.0000 mg | ORAL_TABLET | Freq: Every day | ORAL | Status: DC

## 2019-11-14 MED ORDER — IOHEXOL 350 MG/ML SOLN
100.0000 mL | Freq: Once | INTRAVENOUS | Status: AC | PRN
  Administered 2019-11-14: 19:00:00 100 mL via INTRAVENOUS

## 2019-11-14 MED ORDER — STROKE: EARLY STAGES OF RECOVERY BOOK
Freq: Once | Status: AC
  Filled 2019-11-14: qty 1

## 2019-11-14 MED ORDER — ACETAMINOPHEN 650 MG RE SUPP: 650.0000 mg | RECTAL | Status: DC | PRN

## 2019-11-14 MED ORDER — SENNOSIDES-DOCUSATE SODIUM 8.6-50 MG PO TABS: 1.0000 | ORAL_TABLET | Freq: Every evening | ORAL | Status: DC | PRN

## 2019-11-14 MED ORDER — ACETAMINOPHEN 160 MG/5ML PO SOLN: 650.0000 mg | ORAL | Status: DC | PRN

## 2019-11-14 MED ORDER — ASPIRIN EC 81 MG PO TBEC
81.0000 mg | DELAYED_RELEASE_TABLET | Freq: Every day | ORAL | Status: DC
  Administered 2019-11-15: 81 mg via ORAL
  Filled 2019-11-14: qty 1

## 2019-11-14 MED ORDER — ACETAMINOPHEN 325 MG PO TABS: 650.0000 mg | ORAL_TABLET | ORAL | Status: DC | PRN

## 2019-11-14 NOTE — ED Provider Notes (Signed)
[facility] EMERGENCY DEPARTMENT Provider Note   CSN: [id number] Arrival date & time: 11/14/19  1854  LEVEL 5 CAVEAT - ACUITY OF CONDITION History Chief Complaint  Patient presents with  . Code Stroke    Corey Oneill is a 84 y.o. male.  HPI  84 year old male presents as a code stroke.  Patient reportedly was last seen normal by wife at 5 PM.  When she returned home from the store, patient was unable to speak like normal.  The patient has no focal deficits per EMS but is not able to identify objects.  Past Medical History:  Diagnosis Date  . Abdominal aortic atherosclerosis ([place])   . Anginal pain ([employer])    last noted 08/17/19  . Bone cancer ([place])    Left hip  . BPPV (benign paroxysmal positional vertigo)   . Bullous pemphigoid   . CAD (coronary artery disease)    a.  LHC 8/16: Mid to distal LAD 30%, OM1 40%, proximal mid RCA 40%, distal RCA 60% >> FFR 0.69  >> PCI: 3 x 15 mm Resolute DES  . Depression   . Esophageal reflux   . Family history of adverse reaction to anesthesia    "daughter has PONV"  . Grade I diastolic dysfunction [id number]   Noted on ECHO   . Grade I diastolic dysfunction [id number]   Noted on ECHO  . H/O blood clots    "get them in my stool and urine" (11/12/2015)  . Heart murmur   . Hepatic lesion 12/08/2015   Stable 8 mm right hepatic lesion  . History of aortic stenosis    a. peak to peak gradient by LHC 8/16:  37 mmHg (moderately severe)   . History of appendicitis 2016  . History of blood transfusion 12/2018  . History of herpes labialis   . History of ITP    2018, thrombocytopenia  . History of kidney stones   . Hx of radiation therapy 04/14/13-06/09/13   prostate 7800 cGy, 40 sessions, seminal vesicles 5600 cGy 40 sessions  . Hydrocele 2006   Small  . Hyperlipidemia   . Insomnia    resolved  . LVH (left ventricular hypertrophy)    Moderate, noted on ECHO 09/2018  . 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 Melanoma ([place])    pt denies  . Metastasis from malignant neoplasm of prostate ([place]) 02/25/2019  . Prostate cancer ([place]) dx'd 2014  . Radiation proctitis   . Renal mass 2017   Bilateral renal masses  . S/P skin biopsy 04/09/2019   subepidermal cell poor vesicle , Linear IGG and C3 the basement membrane  . Suprapubic catheter ([place])   . Wears hearing aid in both ears     Patient Active Problem List   Diagnosis Date Noted  . Word finding difficulty 11/14/2019  . Altered mental state 09/25/2019  . Idiopathic thrombocytopenic purpura (ITP) (HCC) 09/25/2019  . Macrocytic anemia 09/25/2019  . Bacteremia due to Enterococcus 09/11/2019  . PICC (peripherally inserted central catheter) in place 09/11/2019  . Bullous pemphigoid 06/12/2019  . Metastasis from malignant neoplasm of prostate ([place]) 02/25/2019  . Radiation proctitis   . Hx of radiation therapy   . Heart murmur   . H/O blood clots   . Family history of adverse reaction to anesthesia   . CAD (coronary artery disease)   . Aortic stenosis   . Anginal pain ([place])   . History of ITP  02/26/2017  . 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 S/P appendectomy 11/12/2015  . Esophageal reflux 03/22/2015  . Depression 11/20/2014  . Prostate cancer ([place]) 03/05/2013  . Erectile dysfunction   . Hyperlipidemia 06/07/2007    Past Surgical History:  Procedure Laterality Date  . CARDIAC CATHETERIZATION N/A 04/21/2015   Procedure: Right/Left Heart Cath and Coronary Angiography;  Surgeon: [name], MD;  Location: [facility];  Service: Cardiovascular;  Laterality: N/A;  . CARDIAC CATHETERIZATION N/A 06/18/2015   Procedure: Intravascular Pressure Wire/FFR Study;  Surgeon: [name], MD;  Location: [facility];  Service: Cardiovascular;  Laterality: N/A;  . CARDIAC CATHETERIZATION N/A 06/18/2015    Procedure: Coronary Stent Intervention;  Surgeon: [name], MD;  Location: [facility];  Service: Cardiovascular;  Laterality: N/A;  . CARDIAC CATHETERIZATION N/A 06/18/2015   Procedure: Right/Left Heart Cath and Coronary Angiography;  Surgeon: [name], MD;  Location: [facility];  Service: Cardiovascular;  Laterality: N/A;  . CARDIAC CATHETERIZATION N/A 06/23/2015   Procedure: Left Heart Cath and Cors/Grafts Angiography;  Surgeon: [name], MD;  Location: [facility];  Service: Cardiovascular;  Laterality: N/A;  . CARDIAC CATHETERIZATION N/A 01/17/2016   Procedure: Right/Left Heart Cath and Coronary Angiography;  Surgeon: [name], MD;  Location: [facility];  Service: Cardiovascular;  Laterality: N/A;  . CATARACT EXTRACTION, BILATERAL    . COLONOSCOPY  06/26/2017  . CYSTOSCOPY WITH BIOPSY N/A 07/30/2018   Procedure: CYSTOSCOPY WITH BIOPSY/FULGURATION, CYSTOLTHOLAPAXY;  Surgeon: [name], MD;  [facility];  Service: Urology;  Laterality: N/A;  . CYSTOSCOPY WITH URETHRAL DILATATION N/A 08/18/2019   Procedure: CYSTOSCOPY WITH URETHRAL DILATATION USING BALLOON OR LASER/ SPURO PUBIC TUBE CHANGE LASER EXCISION OF URETHRAL STRICTURE RETROGRADE URETHROGRAM ANTEGRADE CYSTOGRAM;  Surgeon: [name], MD;  Location: [facility];  Service: Urology;  Laterality: N/A;  . FRACTURE SURGERY    . INGUINAL HERNIA REPAIR     patient does not remember this procedure  . LAPAROSCOPIC APPENDECTOMY N/A 11/16/2015   Procedure: APPENDECTOMY LAPAROSCOPIC;  Surgeon: [name], MD;  Location: [facility];  Service: General;  Laterality: N/A;  . LEFT HEART CATH AND CORONARY ANGIOGRAPHY N/A 12/26/2016   Procedure: Left Heart Cath and Coronary Angiography;  Surgeon: Troy Sine, MD;  Location: [facility];  Service: Cardiovascular;  Laterality: N/A;  . MELANOMA EXCISION Right 10/24/2016   Procedure: WIDE EXCISION MELANOMA RIGHT NECK AND RIGHT  CHEST TIMES 2;  Surgeon: [name], MD;  Location: [facility];  Service: General;  Laterality: Right;  right medial and lateral lesion of chest and right neck  . NASAL FRACTURE SURGERY     "broken years ago; several ORs to correct it"  . NASAL SEPTUM SURGERY    . PROSTATE BIOPSY  2014   "needle biopsy"  . TEE WITHOUT CARDIOVERSION N/A 02/15/2016   Procedure: TRANSESOPHAGEAL ECHOCARDIOGRAM (TEE);  Surgeon: [name], MD;  Location: [facility];  Service: Open Heart Surgery;  Laterality: N/A;  . TEE WITHOUT CARDIOVERSION N/A 08/26/2019   Procedure: TRANSESOPHAGEAL ECHOCARDIOGRAM (TEE);  Surgeon: Pixie Casino, MD;  Location: [facility];  Service: Cardiovascular;  Laterality: N/A;  . TONSILLECTOMY    . TRANSCATHETER AORTIC VALVE REPLACEMENT, TRANSFEMORAL  02/15/2016  . TRANSCATHETER AORTIC VALVE REPLACEMENT, TRANSFEMORAL N/A 02/15/2016   Procedure: TRANSCATHETER AORTIC  VALVE REPLACEMENT, TRANSFEMORAL;  Surgeon: [name], MD;  Location: [facility];  Service: Open Heart Surgery;  Laterality: N/A;  . TRANSURETHRAL RESECTION OF PROSTATE  12/23/2018   Procedure: CYSTOSCOPY WITH  BLADDER BIOPSY WITH FULGERATION/ TRANSURETHRAL RESECTION PROSTATE  AND PROSTATE BIOPSY;  Surgeon: [name], MD;  Location: [facility];  Service: Urology;;       Family History  Problem Relation Age of Onset  . Brain cancer Father   . Lymphoma Mother   . Depression Daughter     Social History   Tobacco Use  . Smoking status: Former Smoker    Packs/day: 0.00    Types: Cigarettes  . Smokeless tobacco: Never Used  . Tobacco comment: "quit smoking cigarettes in the 1960s; quit smoking cigars in the 1990s  Substance Use Topics  . Alcohol use: Yes    Comment: 2 DRINKS PER DAY  . Drug use: No    Home Medications Prior to Admission medications   Medication Sig Start Date End Date Taking? Authorizing Provider  acetaminophen (TYLENOL) 500 MG tablet Take 500 mg by mouth every 6 (six) hours as  needed for moderate pain.     [provider]  amoxicillin (AMOXIL) 500 MG tablet Take 1 tablet (500 mg total) by mouth 2 (two) times daily. 11/07/19   Thayer Headings, MD  aspirin EC 81 MG tablet Take 81 mg by mouth at bedtime.     [provider]  Durwin Reges 5-2.5-18.5 LF-MCG/0.5 injection  05/05/19   [provider]  carvedilol (COREG) 3.125 MG tablet TAKE 1 TABLET BY MOUTH TWICE DAILY. Patient taking differently: Take 3.125 mg by mouth 2 (two) times daily.  06/26/19   [name], MD  cyanocobalamin 2000 MCG tablet Take 2,000 mcg by mouth daily.    [provider]  doxycycline (VIBRA-TABS) 100 MG tablet  07/07/19   [provider]  DULoxetine (CYMBALTA) 30 MG capsule Take 30 mg by mouth daily.    [provider]  enzalutamide Gillermina Phy) 40 MG capsule Take 160 mg by mouth daily.    [provider]  folic acid (FOLVITE) 1 MG tablet Take 1 mg by mouth See admin instructions. Everyday but Tues    [provider]  isosorbide mononitrate (IMDUR) 30 MG 24 hr tablet Take 1 tablet (30 mg total) by mouth daily. 10/22/18 10/22/19  Richardson Dopp T, PA-C  methotrexate (RHEUMATREX) 2.5 MG tablet Take 10 mg by mouth every Tuesday.     [provider]  MULTIPLE VITAMIN PO Take 1 tablet by mouth daily.    [provider]  nitroGLYCERIN (NITROSTAT) 0.4 MG SL tablet 1 TAB UNDER TONGUE AS NEEDED FOR CHEST PAIN. MAY REPEAT EVERY 5 MIN FOR A TOTAL OF 3 DOSES. Patient taking differently: Place 0.4 mg under the tongue every 5 (five) minutes as needed for chest pain.  07/07/19   [name], MD  rosuvastatin (CRESTOR) 10 MG tablet TAKE 1 TABLET ONCE DAILY. Patient taking differently: Take 10 mg by mouth daily.  05/26/19   Tysinger, Camelia Eng, PA-C    Allergies    Prednisone  Review of Systems   Review of Systems  Unable to perform ROS: Acuity of condition    Physical Exam Updated Vital Signs BP (!) 145/86 (BP Location:  Right Arm)   Pulse 73   Temp 98.2 F (36.8 C) (Oral)   Resp 15   Wt 60.6 kg   SpO2 100%   BMI 19.17 kg/m   Physical Exam Vitals  and nursing note reviewed.  Constitutional:      Appearance: He is well-developed.  HENT:     Head: Normocephalic and atraumatic.     Right Ear: External ear normal.     Left Ear: External ear normal.     Nose: Nose normal.  Eyes:     General:        Right eye: No discharge.        Left eye: No discharge.  Cardiovascular:     Rate and Rhythm: Normal rate and regular rhythm.     Heart sounds: Normal heart sounds.  Pulmonary:     Effort: Pulmonary effort is normal.     Breath sounds: Normal breath sounds.  Abdominal:     Palpations: Abdomen is soft.     Tenderness: There is no abdominal tenderness.  Musculoskeletal:     Cervical back: Neck supple.  Skin:    General: Skin is warm and dry.  Neurological:     Mental Status: He is alert.     Comments: Awake, alert, knows self and birthday. Cannot name objects. Follows commands. CN 3-12 grossly intact. 5/5 strength in all 4 extremities.   Psychiatric:        Mood and Affect: Mood is not anxious.     ED Results / Procedures / Treatments   Labs (all labs ordered are listed, but only abnormal results are displayed) Labs Reviewed  APTT - Abnormal; Notable for the following components:      Result Value   aPTT 40 (*)    All other components within normal limits  CBC - Abnormal; Notable for the following components:   RBC 3.36 (*)    Hemoglobin 11.1 (*)    HCT 33.2 (*)    Platelets 69 (*)    All other components within normal limits  COMPREHENSIVE METABOLIC PANEL - Abnormal; Notable for the following components:   Glucose, Bld 110 (*)    Calcium 8.8 (*)    Total Protein 6.3 (*)    Albumin 3.1 (*)    All other components within normal limits  I-STAT CHEM 8, ED - Abnormal; Notable for the following components:   Glucose, Bld 107 (*)    Hemoglobin 11.2 (*)    HCT 33.0 (*)    All other  components within normal limits  CULTURE, BLOOD (ROUTINE X 2)  CULTURE, BLOOD (ROUTINE X 2)  URINE CULTURE  SARS CORONAVIRUS 2 (TAT 6-24 HRS)  PROTIME-INR  DIFFERENTIAL  URINALYSIS, ROUTINE W REFLEX MICROSCOPIC  AMMONIA  HEMOGLOBIN A1C  LIPID PANEL  CBG MONITORING, ED  POC SARS CORONAVIRUS 2 AG -  ED    EKG EKG Interpretation  Date/Time:  Friday November 14 2019 19:26:33 EST Ventricular Rate:  78 PR Interval:    QRS Duration: 112 QT Interval:  429 QTC Calculation: 489 R Axis:   87 Text Interpretation: Sinus rhythm Multiple premature complexes, vent & supraven Consider right atrial enlargement Borderline intraventricular conduction delay Borderline prolonged QT interval no significant change since Nov 2020 Confirmed by Sherwood Gambler [id number]) on 11/14/2019 8:41:24 PM   Radiology CT Code Stroke CTA Head W/WO contrast  Result Date: 11/14/2019 CLINICAL DATA:  Aphasia.  Last seen normal 1700 hours. EXAM: CT ANGIOGRAPHY HEAD AND NECK TECHNIQUE: Multidetector CT imaging of the head and neck was performed using the standard protocol during bolus administration of intravenous contrast. Multiplanar CT image reconstructions and MIPs were obtained to evaluate the vascular anatomy. Carotid stenosis measurements (when applicable) are obtained utilizing  NASCET criteria, using the distal internal carotid diameter as the denominator. CONTRAST:  158mL OMNIPAQUE IOHEXOL 350 MG/ML SOLN COMPARISON:  Head CT earlier same day.  The FINDINGS: CTA NECK FINDINGS Aortic arch: Aortic atherosclerosis. Branching pattern is normal without origin stenosis. Right carotid system: Common carotid artery widely patent to the bifurcation. Carotid bifurcation shows mild atherosclerotic plaque but no stenosis. Cervical ICA is widely patent. Left carotid system: Common carotid artery is widely patent to the bifurcation. Carotid bifurcation shows atherosclerotic plaque but no stenosis. Cervical ICA is widely patent. Vertebral  arteries: The left vertebral artery is dominant. Atherosclerotic plaque at the dominant left vertebral artery origin with stenosis of 70%. Small right vertebral artery widely patent at the origin. Beyond the origin stenosis on the left, both vertebral arteries are widely patent through the cervical region to the foramen magnum. Skeleton: Ordinary cervical spondylosis. Other neck: No mass or lymphadenopathy. Upper chest: Ordinary pleural and parenchymal scarring. Review of the MIP images confirms the above findings CTA HEAD FINDINGS Anterior circulation: Both internal carotid arteries are patent through the skull base and siphon regions. There is atherosclerotic calcification in the carotid siphon regions but no stenosis greater than 30%. The anterior and middle cerebral vessels are patent without proximal stenosis, aneurysm or vascular malformation. No large or medium vessel occlusion. Posterior circulation: Both vertebral arteries are patent at the foramen magnum level. The small right vertebral artery terminates in PICA. The left vertebral artery supplies the basilar without stenosis. No basilar stenosis. Posterior circulation branch vessels are patent and normal. Venous sinuses: Patent and normal. Anatomic variants: None significant. Review of the MIP images confirms the above findings IMPRESSION: No acute large or medium vessel occlusion. No significant intracranial stenotic disease. Atherosclerotic disease at both carotid bifurcations but no stenosis. 70% stenosis at the dominant left vertebral artery origin. These results were communicated to Dr. Cheral Marker at 7:28 pmon 1/1/2021by text page via the [facility] messaging system. Electronically Signed   By: Nelson Chimes M.D.   On: 11/14/2019 19:28   CT Code Stroke CTA Neck W/WO contrast  Result Date: 11/14/2019 CLINICAL DATA:  Aphasia.  Last seen normal 1700 hours. EXAM: CT ANGIOGRAPHY HEAD AND NECK TECHNIQUE: Multidetector CT imaging of the head and neck was  performed using the standard protocol during bolus administration of intravenous contrast. Multiplanar CT image reconstructions and MIPs were obtained to evaluate the vascular anatomy. Carotid stenosis measurements (when applicable) are obtained utilizing NASCET criteria, using the distal internal carotid diameter as the denominator. CONTRAST:  160mL OMNIPAQUE IOHEXOL 350 MG/ML SOLN COMPARISON:  Head CT earlier same day.  The FINDINGS: CTA NECK FINDINGS Aortic arch: Aortic atherosclerosis. Branching pattern is normal without origin stenosis. Right carotid system: Common carotid artery widely patent to the bifurcation. Carotid bifurcation shows mild atherosclerotic plaque but no stenosis. Cervical ICA is widely patent. Left carotid system: Common carotid artery is widely patent to the bifurcation. Carotid bifurcation shows atherosclerotic plaque but no stenosis. Cervical ICA is widely patent. Vertebral arteries: The left vertebral artery is dominant. Atherosclerotic plaque at the dominant left vertebral artery origin with stenosis of 70%. Small right vertebral artery widely patent at the origin. Beyond the origin stenosis on the left, both vertebral arteries are widely patent through the cervical region to the foramen magnum. Skeleton: Ordinary cervical spondylosis. Other neck: No mass or lymphadenopathy. Upper chest: Ordinary pleural and parenchymal scarring. Review of the MIP images confirms the above findings CTA HEAD FINDINGS Anterior circulation: Both internal carotid arteries are patent through  the skull base and siphon regions. There is atherosclerotic calcification in the carotid siphon regions but no stenosis greater than 30%. The anterior and middle cerebral vessels are patent without proximal stenosis, aneurysm or vascular malformation. No large or medium vessel occlusion. Posterior circulation: Both vertebral arteries are patent at the foramen magnum level. The small right vertebral artery terminates in  PICA. The left vertebral artery supplies the basilar without stenosis. No basilar stenosis. Posterior circulation branch vessels are patent and normal. Venous sinuses: Patent and normal. Anatomic variants: None significant. Review of the MIP images confirms the above findings IMPRESSION: No acute large or medium vessel occlusion. No significant intracranial stenotic disease. Atherosclerotic disease at both carotid bifurcations but no stenosis. 70% stenosis at the dominant left vertebral artery origin. These results were communicated to Dr. Cheral Marker at 7:28 pmon 1/1/2021by text page via the [facility] messaging system. Electronically Signed   By: Nelson Chimes M.D.   On: 11/14/2019 19:28   MR BRAIN WO CONTRAST  Result Date: 11/14/2019 CLINICAL DATA:  Initial evaluation for focal neural deficit, stroke suspected. EXAM: MRI HEAD WITHOUT CONTRAST TECHNIQUE: Multiplanar, multiecho pulse sequences of the brain and surrounding structures were obtained without intravenous contrast. COMPARISON:  Prior CTs from earlier the same day. FINDINGS: Brain: Moderately advanced cerebral atrophy, most pronounced at the anterior temporal lobes bilaterally. Patchy T2/FLAIR hyperintensity within the periventricular and deep white matter both cerebral hemispheres most consistent with chronic small vessel ischemic disease, moderate in nature. Few scatter remote lacunar infarcts noted at the left caudate and hemispheric cerebral white matter. No abnormal foci of restricted diffusion to suggest acute or subacute ischemia. Gray-white matter differentiation well maintained. No encephalomalacia to suggest chronic infarction. No evidence for acute intracranial hemorrhage. Few scattered chronic micro hemorrhages noted involving the supratentorial cerebral white matter, likely small vessel related. No mass lesion, midline shift or mass effect. No hydrocephalus. No extra-axial fluid collection. Major dural sinuses are grossly patent. Pituitary gland and  suprasellar region are normal. Midline structures intact and normal. Vascular: Major intracranial vascular flow voids well maintained. Hypoplastic right vertebral artery noted. Skull and upper cervical spine: Craniocervical junction normal. Visualized upper cervical spine within normal limits. Bone marrow signal intensity normal. No scalp soft tissue abnormality. Sinuses/Orbits: Patient status post bilateral ocular lens replacement. Globes and orbital soft tissues demonstrate no acute finding. Paranasal sinuses are clear. Moderate left with small right mastoid effusions noted, likely benign. Visualized nasopharynx within normal limits. Inner ear structures grossly normal. Other: None. IMPRESSION: 1. No acute intracranial abnormality. 2. Moderately advanced cerebral atrophy with chronic small vessel ischemic disease. Electronically Signed   By: Jeannine Boga M.D.   On: 11/14/2019 22:54   DG Chest Portable 1 View  Result Date: 11/14/2019 CLINICAL DATA:  Code stroke. EXAM: PORTABLE CHEST 1 VIEW COMPARISON:  Chest radiograph 09/25/2019 FINDINGS: Stable cardiomediastinal contours with normal heart size. Stable calcified left hilar lymph nodes. The lungs are clear. No pneumothorax or large pleural effusion. No acute finding in the visualized skeleton. IMPRESSION: No acute cardiopulmonary process. Electronically Signed   By: Audie Pinto M.D.   On: 11/14/2019 19:53   CT HEAD CODE STROKE WO CONTRAST  Result Date: 11/14/2019 CLINICAL DATA:  Code stroke.  Aphasia.  Last seen normal 1700 hours. EXAM: CT HEAD WITHOUT CONTRAST TECHNIQUE: Contiguous axial images were obtained from the base of the skull through the vertex without intravenous contrast. COMPARISON:  09/25/2019 FINDINGS: Brain: Age related atrophy. Old ischemic changes of the pontomedullary junction region. Old small  vessel cerebellar infarctions. Cerebral hemispheres show age related atrophy with extensive chronic small vessel disease of the white  matter. Presumed artifactual low-density in the right frontal region. No sign of mass lesion, hemorrhage, hydrocephalus or extra-axial collection. Vascular: There is atherosclerotic calcification of the major vessels at the base of the brain. Skull: Negative Sinuses/Orbits: Clear/normal Other: Old healed facial fractures on the right. ASPECTS [facility] Stroke Program Early CT Score) - Ganglionic level infarction (caudate, lentiform nuclei, internal capsule, insula, M1-M3 cortex): 7 - Supraganglionic infarction (M4-M6 cortex): 3 Total score (0-10 with 10 being normal): 10 IMPRESSION: 1. No acute finding by CT. Atrophy and chronic small-vessel ischemic changes. 2. ASPECTS is 10 3. These results were communicated to Dr. Cheral Marker at [place] 1/1/2021by text page via the [facility] messaging system. Electronically Signed   By: Nelson Chimes M.D.   On: 11/14/2019 19:16    Procedures Procedures (including critical care time)  Medications Ordered in ED Medications  rosuvastatin (CRESTOR) tablet 10 mg (has no administration in time range)  aspirin EC tablet 81 mg (has no administration in time range)  amoxicillin (AMOXIL) capsule 500 mg (has no administration in time range)   stroke: mapping our early stages of recovery book (has no administration in time range)  acetaminophen (TYLENOL) tablet 650 mg (has no administration in time range)    Or  acetaminophen (TYLENOL) 160 MG/5ML solution 650 mg (has no administration in time range)    Or  acetaminophen (TYLENOL) suppository 650 mg (has no administration in time range)  senna-docusate (Senokot-S) tablet 1 tablet (has no administration in time range)  sodium chloride flush (NS) 0.9 % injection 3 mL (3 mLs Intravenous Given 11/14/19 1924)  iohexol (OMNIPAQUE) 350 MG/ML injection 100 mL (100 mLs Intravenous Contrast Given 11/14/19 1920)    ED Course  I have reviewed the triage vital signs and the nursing notes.  Pertinent labs & imaging results that were available  during my care of the patient were reviewed by me and considered in my medical decision making (see chart for details).    MDM Rules/Calculators/A&P                      Patient presents as a code stroke.  tPA deferred by neurology given the thrombocytopenia.  Not an intervention candidate.  Has had a questionable endocarditis and so we will get blood cultures and infectious work-up but at this point he will need MRI and stroke work-up.  Hospitalist to admit. Final Clinical Impression(s) / ED Diagnoses Final diagnoses:  Aphasia    Rx / DC Orders ED Discharge Orders    None       Sherwood Gambler, MD 11/15/19 [phone]

## 2019-11-14 NOTE — ED Notes (Signed)
Attempted report 

## 2019-11-14 NOTE — ED Notes (Signed)
Patient transported to MRI 

## 2019-11-14 NOTE — Code Documentation (Signed)
Responded to Code Stroke called at [facility] for aphasia, [id number]. Wife left to go to grocery store at 1700 and pt was at his baseline. When she came back at 1830, wife noticed pt was having difficulty finding his words.  Pt arrived at [facility], CBG-94, NIH-3 for R leg droop/sensory deficit and aphasia. CT head negative, CTA-no LVO. Plan for MRI.

## 2019-11-14 NOTE — Consult Note (Signed)
Neurology Consultation  Reason for Consult: Code stroke Referring Physician: Dr Regenia Skeeter  CC: Speech difficulty  History is obtained from: Patient's wife over the phone, EMS, chart  HPI: Corey Oneill is a 84 y.o. male past medical history of significant resevere aortic stenosis status post TAVR, ITP, prostate cancer status post external beam radiation with mets to the left iliac bone, radiation prostatitis, incontinence for many months to years, hyperlipidemia, depression, coronary artery disease, chronic diastolic congestive heart failure, bullous pemphigus on methotrexate, macrocytic anemia, prior history of alcohol abuse presented to the hospital via EMS for sudden onset of speech difficulty. Last known normal 5 PM when wife went to the grocery store leaving him home.  Upon her return, she found him to be confused eyes open, not responding to questions and speech making no sense. No focal tingling numbness or weakness reported.  No prior history of similar episode but there have been episodes where he stares off in space which has happened a few times in the past according to the wife. He was recently diagnosed with Enterococcus bacteremia and discharged from the hospital on September 30, 2019, following which she was in therapy and was discharged home on Christmas Eve.  The family says that he is not been himself ever since his admission to [facility] in November for the bacteremia and has been very frail and weak. This past summer, he was walking his dogs and driving independently.  He was admitted at first to [facility] in October, with sepsis.  Then again presented to [facility] in November with sepsis.  He follows with ID has an outpatient, who are treating him for presumptive bacteremia and endocarditis as they cannot do a TEE due to his ITP and with his history of TAVR.   LKW: 5 PM today tpa given?: no, platelet count below 100,000 Premorbid modified Rankin scale (mRS):  Over the past few months-3-4.  Prior to that 1  ROS:  Unable to obtain due to altered mental status.  History obtained from family noted in the HPI  Past Medical History:  Diagnosis Date  . Abdominal aortic atherosclerosis ([place])   . Anginal pain ([place])    last noted 08/17/19  . Bone cancer ([place])    Left hip  . BPPV (benign paroxysmal positional vertigo)   . Bullous pemphigoid   . CAD (coronary artery disease)    a.  LHC 8/16: Mid to distal LAD 30%, OM1 40%, proximal mid RCA 40%, distal RCA 60% >> FFR 0.69  >> PCI: 3 x 15 mm Resolute DES  . Depression   . Esophageal reflux   . Family history of adverse reaction to anesthesia    "daughter has PONV"  . Grade I diastolic dysfunction [id number]   Noted on ECHO   . Grade I diastolic dysfunction [id number]   Noted on ECHO  . H/O blood clots    "get them in my stool and urine" (11/12/2015)  . Heart murmur   . Hepatic lesion 12/08/2015   Stable 8 mm right hepatic lesion  . History of aortic stenosis    a. peak to peak gradient by LHC 8/16:  37 mmHg (moderately severe)   . History of appendicitis 2016  . History of blood transfusion 12/2018  . History of herpes labialis   . History of ITP    2018, thrombocytopenia  . History of kidney stones   . Hx of radiation therapy 04/14/13-06/09/13   prostate 7800 cGy, 40 sessions,  seminal vesicles 5600 cGy 40 sessions  . Hydrocele 2006   Small  . Hyperlipidemia   . Insomnia    resolved  . LVH (left ventricular hypertrophy)    Moderate, noted on ECHO 09/2018  . Malignant melanoma in situ ([place]) 09/04/2016   Right neck and chest  . Melanoma ([place])    pt denies  . Metastasis from malignant neoplasm of prostate ([place]) 02/25/2019  . Prostate cancer ([place]) dx'd 2014  . Radiation proctitis   . Renal mass 2017   Bilateral renal masses  . S/P skin biopsy 04/09/2019   subepidermal cell poor vesicle , Linear IGG and C3 the basement membrane  . Suprapubic catheter ([place])   . Wears hearing aid in both ears       Family History  Problem Relation Age of Onset  . Brain cancer Father   . Lymphoma Mother   . Depression Daughter    Social History:   reports that he has quit smoking. His smoking use included cigarettes. He smoked 0.00 packs per day. He has never used smokeless tobacco. He reports current alcohol use. He reports that he does not use drugs.   Medications  Current Facility-Administered Medications:  .  sodium chloride flush (NS) 0.9 % injection 3 mL, 3 mL, Intravenous, Once, Sherwood Gambler, MD  Current Outpatient Medications:  .  acetaminophen (TYLENOL) 500 MG tablet, Take 500 mg by mouth every 6 (six) hours as needed for moderate pain. , Disp: , Rfl:  .  amoxicillin (AMOXIL) 500 MG tablet, Take 1 tablet (500 mg total) by mouth 2 (two) times daily., Disp: 60 tablet, Rfl: 11 .  aspirin EC 81 MG tablet, Take 81 mg by mouth at bedtime. , Disp: , Rfl:  .  BOOSTRIX 5-2.5-18.5 LF-MCG/0.5 injection, , Disp: , Rfl:  .  carvedilol (COREG) 3.125 MG tablet, TAKE 1 TABLET BY MOUTH TWICE DAILY. (Patient taking differently: Take 3.125 mg by mouth 2 (two) times daily. ), Disp: 60 tablet, Rfl: 2 .  cyanocobalamin 2000 MCG tablet, Take 2,000 mcg by mouth daily., Disp: , Rfl:  .  doxycycline (VIBRA-TABS) 100 MG tablet, , Disp: , Rfl:  .  DULoxetine (CYMBALTA) 30 MG capsule, Take 30 mg by mouth daily., Disp: , Rfl:  .  enzalutamide (XTANDI) 40 MG capsule, Take 160 mg by mouth daily., Disp: , Rfl:  .  folic acid (FOLVITE) 1 MG tablet, Take 1 mg by mouth See admin instructions. Everyday but Tues, Disp: , Rfl:  .  isosorbide mononitrate (IMDUR) 30 MG 24 hr tablet, Take 1 tablet (30 mg total) by mouth daily., Disp: 30 tablet, Rfl: 11 .  methotrexate (RHEUMATREX) 2.5 MG tablet, Take 10 mg by mouth every Tuesday. , Disp: , Rfl:  .  MULTIPLE VITAMIN PO, Take 1 tablet by mouth daily., Disp: , Rfl:  .  nitroGLYCERIN (NITROSTAT) 0.4 MG SL tablet, 1 TAB UNDER TONGUE AS NEEDED FOR CHEST PAIN. MAY REPEAT EVERY  5 MIN FOR A TOTAL OF 3 DOSES. (Patient taking differently: Place 0.4 mg under the tongue every 5 (five) minutes as needed for chest pain. ), Disp: 25 tablet, Rfl: 2 .  rosuvastatin (CRESTOR) 10 MG tablet, TAKE 1 TABLET ONCE DAILY. (Patient taking differently: Take 10 mg by mouth daily. ), Disp: 90 tablet, Rfl: 0   Exam: Current vital signs: Wt 60.6 kg   BMI 19.17 kg/m  Vital signs in last 24 hours: Weight:  [60.6 kg] 60.6 kg (01/01 1916) General: Awake alert in no distress  HEENT: Cephalic atraumatic Lungs: Clear Cardiovascular: Regular rhythm Abdomen: Soft nondistended nontender Extremities warm well perfused Neurological exam He is awake, alert, oriented to self and date. He is not oriented to place Speech is clear. Naming is mildly impaired.  Repetition is preserved.  Comprehension is preserved.  Fluency is preserved. Cranial nerves: Pupils equal round react light, extraocular movements intact, visual fields full, face appears symmetric, facial sensation intact, tongue and palate midline Motor exam: No drift in any of the extremities with the exception of a mild drift in right lower extremity. Sensory exam: Possibly diminished sensation on the right but unclear because of his word finding difficulty Coordination: No ataxia NIH stroke scale 3  Labs I have reviewed labs in epic and the results pertinent to this consultation are:  CBC    Component Value Date/Time   WBC 3.1 (L) 09/28/2019 0402   RBC 3.22 (L) 09/28/2019 0402   HGB 11.2 (L) 11/14/2019 1903   HGB 11.7 (L) 08/14/2019 0938   HGB 11.8 (L) 07/14/2019 0953   HGB 12.8 (L) 09/21/2017 1101   HCT 33.0 (L) 11/14/2019 1903   HCT 33.6 (L) 09/25/2019 1425   HCT 38.2 (L) 09/21/2017 1101   PLT 50 (L) 09/28/2019 0402   PLT 73 (L) 08/14/2019 0938   PLT 47 (LL) 07/14/2019 0953   MCV 96.0 09/28/2019 0402   MCV 96 07/14/2019 0953   MCV 94.9 09/21/2017 1101   MCH 33.2 09/28/2019 0402   MCHC 34.6 09/28/2019 0402   RDW 13.7  09/28/2019 0402   RDW 11.9 07/14/2019 0953   RDW 13.5 09/21/2017 1101   LYMPHSABS 0.6 (L) 09/28/2019 0402   LYMPHSABS 0.9 07/14/2019 0953   LYMPHSABS 0.9 09/21/2017 1101   MONOABS 0.7 09/28/2019 0402   MONOABS 0.4 09/21/2017 1101   EOSABS 0.1 09/28/2019 0402   EOSABS 0.0 07/14/2019 0953   BASOSABS 0.0 09/28/2019 0402   BASOSABS 0.0 07/14/2019 0953   BASOSABS 0.0 09/21/2017 1101    CMP     Component Value Date/Time   NA 137 11/14/2019 1903   NA 142 07/14/2019 0953   NA 141 09/21/2017 1101   K 4.0 11/14/2019 1903   K 4.4 09/21/2017 1101   CL 98 11/14/2019 1903   CO2 24 09/30/2019 0500   CO2 29 09/21/2017 1101   GLUCOSE 107 (H) 11/14/2019 1903   GLUCOSE 90 09/21/2017 1101   BUN 18 11/14/2019 1903   BUN 19 07/14/2019 0953   BUN 18.7 09/21/2017 1101   CREATININE 0.80 11/14/2019 1903   CREATININE 0.80 08/14/2019 0938   CREATININE 0.9 09/21/2017 1101   CALCIUM 8.1 (L) 09/30/2019 0500   CALCIUM 8.7 09/21/2017 1101   PROT 6.8 09/25/2019 1000   PROT 6.2 07/14/2019 0953   PROT 7.2 09/21/2017 1101   ALBUMIN 2.6 (L) 09/30/2019 0500   ALBUMIN 4.1 07/14/2019 0953   ALBUMIN 3.7 09/21/2017 1101   AST 17 09/25/2019 1000   AST 17 08/14/2019 0938   AST 22 09/21/2017 1101   ALT 9 09/25/2019 1000   ALT 8 08/14/2019 0938   ALT 16 09/21/2017 1101   ALKPHOS 70 09/25/2019 1000   ALKPHOS 79 09/21/2017 1101   BILITOT 0.8 09/25/2019 1000   BILITOT 0.6 08/14/2019 0938   BILITOT 0.70 09/21/2017 1101   GFRNONAA >60 09/30/2019 0500   GFRNONAA >60 08/14/2019 0938   GFRAA >60 09/30/2019 0500   GFRAA >60 08/14/2019 [id number]    Imaging I have reviewed the images obtained:  CT-scan of the brain-no acute  changes CTA head and neck with no emergent LVO.  Atherosclerotic disease at both carotid bifurcations.  70% stenosis of the dominant left vertebral artery origin.  Assessment:  84 year old with above complex past medical history presenting for evaluation of sudden onset of speech difficulties  and confusion. Given his multiple comorbidities and recent bacteremias and prolonged hospital stay, a stroke is always within the realm of possibility. He is not a candidate for IV TPA due to ITP, platelet count less than 100,000. He is not a candidate for endovascular thrombectomy due to no emergent LVO on imaging. He does have some word finding difficulty and possibly some side right-sided mild motor and sensory deficits-which could be from a stroke but given prior history of bacteremia and urosepsis, I would also evaluate for toxic metabolic encephalopathy.  Impression: Sudden onset of speech difficulties Evaluate for stroke v TIA Evaluate for toxic metabolic encephalopathy  Recommendations: I would repeat transthoracic echocardiogram I would obtain an MRI of the brain without contrast Frequent neurochecks Telemetry Continue his aspirin if okay with his thrombocytopenia. Check chest x-ray, UA, blood cultures PT OT speech therapy Allow for permissive hypertension for now-treat only if systolics are over [id number], with consideration of stroke being high in the differentials.  Spoke with the wife in detail. Explained my treatment plan. Answer questions  Plan relayed to Dr. Verner Chol in the ER  Stroke team will follow Please call with questions   -- Amie Portland, MD [facility] Pager: [phone] If 7pm to 7am, please call on call as listed on AMION.  CRITICAL CARE ATTESTATION Performed by: Amie Portland, MD Total critical care time: 55 minutes Critical care time was exclusive of separately billable procedures and treating other patients and/or supervising APPs/Residents/Students Critical care was necessary to treat or prevent imminent or life-threatening deterioration due to acute stroke versus TIA, toxic metabolic encephalopathy This patient is critically ill and at significant risk for neurological worsening and/or death and care requires constant  monitoring. Critical care was time spent personally by me on the following activities: development of treatment plan with patient and/or surrogate as well as nursing, discussions with consultants, evaluation of patient's response to treatment, examination of patient, obtaining history from patient or surrogate, ordering and performing treatments and interventions, ordering and review of laboratory studies, ordering and review of radiographic studies, pulse oximetry, re-evaluation of patient's condition, participation in multidisciplinary rounds and medical decision making of high complexity in the care of this patient.

## 2019-11-14 NOTE — H&P (Addendum)
History and Physical    DEMERICK BUCHHOLTZ [id number] DOB: 1936-11-11 DOA: 11/14/2019  PCP: [name], MD   Patient coming from: Home   Chief Complaint: Acute speech difficulty   HPI: Corey Oneill is a 84 y.o. 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  Patient's wife provided most of the history, noting that the patient seemed to be in his usual state at 5 PM when she left the house.  Upon returning night around 6:30 PM, patient was not responding to her basic questions and his speech was not intelligible.  He has not exhibited the symptoms previously, but his wife noted a history of "staring off" episodes.  Patient's family notes that Mr. Holthusen has been generally weak and frail since a series of hospital admissions over the past few months.  While still in the emergency department, patient reports that he feels back to his baseline, describes "a very strange sensation" earlier this evening marked by an inability to speak.  Patient denied having any headache or focal numbness or weakness during the episode, did not feel confused, but was unable to get the words out.  There has not been any recent chest pain, palpitations, and he also denies fevers or chills recently.  ED Course: Upon arrival to the ED, patient is found to be afebrile, saturating well, and with stable blood pressure.  EKG features sinus rhythm with PVCs.  Chest x-ray is negative for acute cardiopulmonary disease.  Noncontrast head CT is negative for acute intracranial abnormality.  There is no acute large or medium vessel occlusion, or significant intracranial stenoses on CTA head and neck.  Chemistry panel largely unremarkable and CBC notable for a stable  normocytic anemia and chronic thrombocytopenia.  Blood cultures were collected in the emergency department.  Covid antigen test was negative and PCR test pending.  Neurology has seen the patient in the emergency department and recommends further evaluation of possible CVA/TIA or toxic metabolic encephalopathy.  Review of Systems:  Full ROS completed and negative aside from what is noted in HPI.  Past Medical History:  Diagnosis Date  . Abdominal aortic atherosclerosis ([place])   . Anginal pain ([place])    last noted 08/17/19  . Bone cancer ([place])    Left hip  . BPPV (benign paroxysmal positional vertigo)   . Bullous pemphigoid   . CAD (coronary artery disease)    a.  LHC 8/16: Mid to distal LAD 30%, OM1 40%, proximal mid RCA 40%, distal RCA 60% >> FFR 0.69  >> PCI: 3 x 15 mm Resolute DES  . Depression   . Esophageal reflux   . Family history of adverse reaction to anesthesia    "daughter has PONV"  . Grade I diastolic dysfunction [id number]   Noted on ECHO   . Grade I diastolic dysfunction [id number]   Noted on ECHO  . H/O blood clots    "get them in my stool and urine" (11/12/2015)  . Heart murmur   . Hepatic lesion 12/08/2015   Stable 8 mm right hepatic lesion  . History of aortic stenosis    a. peak to peak gradient by LHC 8/16:  37 mmHg (moderately severe)   . History of appendicitis 2016  . History of blood transfusion 12/2018  . History of herpes labialis   .  History of ITP    2018, thrombocytopenia  . History of kidney stones   . Hx of radiation therapy 04/14/13-06/09/13   prostate 7800 cGy, 40 sessions, seminal vesicles 5600 cGy 40 sessions  . Hydrocele 2006   Small  . Hyperlipidemia   . Insomnia    resolved  . LVH (left ventricular hypertrophy)    Moderate, noted on ECHO 09/2018  . Malignant melanoma in situ ([place]) 09/04/2016   Right neck and chest  . Melanoma ([place])    pt denies  . Metastasis from malignant neoplasm of prostate ([place]) 02/25/2019  . Prostate cancer ([place])  dx'd 2014  . Radiation proctitis   . Renal mass 2017   Bilateral renal masses  . S/P skin biopsy 04/09/2019   subepidermal cell poor vesicle , Linear IGG and C3 the basement membrane  . Suprapubic catheter ([place])   . Wears hearing aid in both ears     Past Surgical History:  Procedure Laterality Date  . CARDIAC CATHETERIZATION N/A 04/21/2015   Procedure: Right/Left Heart Cath and Coronary Angiography;  Surgeon: [name], MD;  Location: [place] [facility];  Service: Cardiovascular;  Laterality: N/A;  . CARDIAC CATHETERIZATION N/A 06/18/2015   Procedure: Intravascular Pressure Wire/FFR Study;  Surgeon: [name], MD;  Location: [place] [facility];  Service: Cardiovascular;  Laterality: N/A;  . CARDIAC CATHETERIZATION N/A 06/18/2015   Procedure: Coronary Stent Intervention;  Surgeon: [name], MD;  Location: [place] [facility];  Service: Cardiovascular;  Laterality: N/A;  . CARDIAC CATHETERIZATION N/A 06/18/2015   Procedure: Right/Left Heart Cath and Coronary Angiography;  Surgeon: [name], MD;  Location: [place] [facility];  Service: Cardiovascular;  Laterality: N/A;  . CARDIAC CATHETERIZATION N/A 06/23/2015   Procedure: Left Heart Cath and Cors/Grafts Angiography;  Surgeon: [name], MD;  Location: [place] [facility];  Service: Cardiovascular;  Laterality: N/A;  . CARDIAC CATHETERIZATION N/A 01/17/2016   Procedure: Right/Left Heart Cath and Coronary Angiography;  Surgeon: [name], MD;  Location: [place] [facility];  Service: Cardiovascular;  Laterality: N/A;  . CATARACT EXTRACTION, BILATERAL    . COLONOSCOPY  06/26/2017  . CYSTOSCOPY WITH BIOPSY N/A 07/30/2018   Procedure: CYSTOSCOPY WITH BIOPSY/FULGURATION, CYSTOLTHOLAPAXY;  Surgeon: [name], MD;  Location: [facility];  Service: Urology;  Laterality: N/A;  . CYSTOSCOPY WITH URETHRAL DILATATION N/A 08/18/2019   Procedure: CYSTOSCOPY WITH URETHRAL DILATATION USING BALLOON OR LASER/ SPURO PUBIC  TUBE CHANGE LASER EXCISION OF URETHRAL STRICTURE RETROGRADE URETHROGRAM ANTEGRADE CYSTOGRAM;  Surgeon: [name], MD;  Location: [facility];  Service: Urology;  Laterality: N/A;  . FRACTURE SURGERY    . INGUINAL HERNIA REPAIR     patient does not remember this procedure  . LAPAROSCOPIC APPENDECTOMY N/A 11/16/2015   Procedure: APPENDECTOMY LAPAROSCOPIC;  Surgeon: [name], MD;  Location: [place];  Service: General;  Laterality: N/A;  . LEFT HEART CATH AND CORONARY ANGIOGRAPHY N/A 12/26/2016   Procedure: Left Heart Cath and Coronary Angiography;  Surgeon: Troy Sine, MD;  Location: [place] [facility];  Service: Cardiovascular;  Laterality: N/A;  . MELANOMA EXCISION Right 10/24/2016   Procedure: WIDE EXCISION MELANOMA RIGHT NECK AND RIGHT CHEST TIMES 2;  Surgeon: [name], MD;  Location: [place];  Service: General;  Laterality: Right;  right medial and lateral lesion of chest and right neck  . NASAL FRACTURE SURGERY     "broken years ago; several ORs to correct it"  .  NASAL SEPTUM SURGERY    . PROSTATE BIOPSY  2014   "needle biopsy"  . TEE WITHOUT CARDIOVERSION N/A 02/15/2016   Procedure: TRANSESOPHAGEAL ECHOCARDIOGRAM (TEE);  Surgeon: [name], MD;  Location: [place];  Service: Open Heart Surgery;  Laterality: N/A;  . TEE WITHOUT CARDIOVERSION N/A 08/26/2019   Procedure: TRANSESOPHAGEAL ECHOCARDIOGRAM (TEE);  Surgeon: Pixie Casino, MD;  Location: [facility];  Service: Cardiovascular;  Laterality: N/A;  . TONSILLECTOMY    . TRANSCATHETER AORTIC VALVE REPLACEMENT, TRANSFEMORAL  02/15/2016  . TRANSCATHETER AORTIC VALVE REPLACEMENT, TRANSFEMORAL N/A 02/15/2016   Procedure: TRANSCATHETER AORTIC VALVE REPLACEMENT, TRANSFEMORAL;  Surgeon: [name], MD;  Location: [place];  Service: Open Heart Surgery;  Laterality: N/A;  . TRANSURETHRAL RESECTION OF PROSTATE  12/23/2018   Procedure: CYSTOSCOPY WITH  BLADDER BIOPSY WITH FULGERATION/ TRANSURETHRAL RESECTION  PROSTATE  AND PROSTATE BIOPSY;  Surgeon: [name], MD;  Location: [facility];  Service: Urology;;     reports that he has quit smoking. His smoking use included cigarettes. He smoked 0.00 packs per day. He has never used smokeless tobacco. He reports current alcohol use. He reports that he does not use drugs.  Allergies  Allergen Reactions  . Prednisone Other (See Comments)    Makes depressed    Family History  Problem Relation Age of Onset  . Brain cancer Father   . Lymphoma Mother   . Depression Daughter      Prior to Admission medications   Medication Sig Start Date End Date Taking? Authorizing Provider  acetaminophen (TYLENOL) 500 MG tablet Take 500 mg by mouth every 6 (six) hours as needed for moderate pain.     [provider]  amoxicillin (AMOXIL) 500 MG tablet Take 1 tablet (500 mg total) by mouth 2 (two) times daily. 11/07/19   Thayer Headings, MD  aspirin EC 81 MG tablet Take 81 mg by mouth at bedtime.     [provider]  Durwin Reges 5-2.5-18.5 LF-MCG/0.5 injection  05/05/19   [provider]  carvedilol (COREG) 3.125 MG tablet TAKE 1 TABLET BY MOUTH TWICE DAILY. Patient taking differently: Take 3.125 mg by mouth 2 (two) times daily.  06/26/19   [name], MD  cyanocobalamin 2000 MCG tablet Take 2,000 mcg by mouth daily.    [provider]  doxycycline (VIBRA-TABS) 100 MG tablet  07/07/19   [provider]  DULoxetine (CYMBALTA) 30 MG capsule Take 30 mg by mouth daily.    [provider]  enzalutamide Gillermina Phy) 40 MG capsule Take 160 mg by mouth daily.    [provider]  folic acid (FOLVITE) 1 MG tablet Take 1 mg by mouth See admin instructions. Everyday but Tues    [provider]  isosorbide mononitrate (IMDUR) 30 MG 24 hr tablet Take 1 tablet (30 mg total) by mouth daily. 10/22/18 10/22/19  Richardson Dopp T, PA-C  methotrexate (RHEUMATREX) 2.5 MG tablet Take 10 mg by mouth every Tuesday.      [provider]  MULTIPLE VITAMIN PO Take 1 tablet by mouth daily.    [provider]  nitroGLYCERIN (NITROSTAT) 0.4 MG SL tablet 1 TAB UNDER TONGUE AS NEEDED FOR CHEST PAIN. MAY REPEAT EVERY 5 MIN FOR A TOTAL OF 3 DOSES. Patient taking differently: Place 0.4 mg under the tongue every 5 (five) minutes as needed for chest pain.  07/07/19   [name], MD  rosuvastatin (CRESTOR) 10 MG tablet TAKE 1 TABLET ONCE DAILY. Patient taking differently: Take 10 mg by  mouth daily.  05/26/19   Carlena Hurl, PA-C    Physical Exam: Vitals:   11/14/19 1916 11/14/19 1930 11/14/19 2000 11/14/19 2014  BP:  118/61    Pulse:  (!) 48 67   Resp:  19 20   Temp:    98.4 F (36.9 C)  TempSrc:    Oral  SpO2:  98% 100%   Weight: 60.6 kg       Constitutional: NAD, calm  Eyes: PERTLA, lids and conjunctivae normal ENMT: Mucous membranes are moist. Posterior pharynx clear of any exudate or lesions.   Neck: normal, supple, no masses, no thyromegaly Respiratory:  no wheezing, no crackles. Normal respiratory effort. No accessory muscle use.  Cardiovascular: S1 & S2 heard, regular rate and rhythm. No extremity edema.   Abdomen: No distension, no tenderness, soft. Bowel sounds active.  Musculoskeletal: no clubbing / cyanosis. No joint deformity upper and lower extremities.    Skin: no significant rashes, lesions, ulcers on exposed surfaces. Warm, dry, well-perfused. Neurologic: CN 2-12 grossly intact. Gross hearing deficit. Sensation intact. Strength 5/5 in all 4 limbs.  Psychiatric: Alert and oriented to person, place, and situation. Calm, cooperative.     Labs on Admission: I have personally reviewed following labs and imaging studies  CBC: Recent Labs  Lab 11/14/19 1901 11/14/19 1903  WBC 4.2  --   NEUTROABS 2.6  --   HGB 11.1* 11.2*  HCT 33.2* 33.0*  MCV 98.8  --   PLT 69*  --    Basic Metabolic Panel: Recent Labs  Lab 11/14/19 1901 11/14/19 1903  NA 136 137  K 4.0 4.0    CL 101 98  CO2 23  --   GLUCOSE 110* 107*  BUN 16 18  CREATININE 0.85 0.80  CALCIUM 8.8*  --    GFR: Estimated Creatinine Clearance: 62.1 mL/min (by C-G formula based on SCr of 0.8 mg/dL). Liver Function Tests: Recent Labs  Lab 11/14/19 1901  AST 17  ALT 9  ALKPHOS 72  BILITOT 0.5  PROT 6.3*  ALBUMIN 3.1*   No results for input(s): LIPASE, AMYLASE in the last 168 hours. No results for input(s): AMMONIA in the last 168 hours. Coagulation Profile: Recent Labs  Lab 11/14/19 1901  INR 1.1   Cardiac Enzymes: No results for input(s): CKTOTAL, CKMB, CKMBINDEX, TROPONINI in the last 168 hours. BNP (last 3 results) No results for input(s): PROBNP in the last 8760 hours. HbA1C: No results for input(s): HGBA1C in the last 72 hours. CBG: Recent Labs  Lab 11/14/19 1856  GLUCAP 94   Lipid Profile: No results for input(s): CHOL, HDL, LDLCALC, TRIG, CHOLHDL, LDLDIRECT in the last 72 hours. Thyroid Function Tests: No results for input(s): TSH, T4TOTAL, FREET4, T3FREE, THYROIDAB in the last 72 hours. Anemia Panel: No results for input(s): VITAMINB12, FOLATE, FERRITIN, TIBC, IRON, RETICCTPCT in the last 72 hours. Urine analysis:    Component Value Date/Time   COLORURINE YELLOW 09/25/2019 1135   APPEARANCEUR CLEAR 09/25/2019 1135   LABSPEC 1.016 09/25/2019 1135   PHURINE 7.0 09/25/2019 1135   GLUCOSEU NEGATIVE 09/25/2019 1135   HGBUR SMALL (A) 09/25/2019 1135   BILIRUBINUR NEGATIVE 09/25/2019 1135   BILIRUBINUR n 10/15/2015 0939   KETONESUR 5 (A) 09/25/2019 1135   PROTEINUR 30 (A) 09/25/2019 1135   UROBILINOGEN 0.2 10/15/2015 0939   UROBILINOGEN 0.2 06/23/2015 1443   NITRITE NEGATIVE 09/25/2019 1135   LEUKOCYTESUR NEGATIVE 09/25/2019 1135   Sepsis Labs: @LABRCNTIP (procalcitonin:4,lacticidven:4) )No results found for this or any  previous visit (from the past 240 hour(s)).   Radiological Exams on Admission: CT Code Stroke CTA Head W/WO contrast  Result Date:  11/14/2019 CLINICAL DATA:  Aphasia.  Last seen normal 1700 hours. EXAM: CT ANGIOGRAPHY HEAD AND NECK TECHNIQUE: Multidetector CT imaging of the head and neck was performed using the standard protocol during bolus administration of intravenous contrast. Multiplanar CT image reconstructions and MIPs were obtained to evaluate the vascular anatomy. Carotid stenosis measurements (when applicable) are obtained utilizing NASCET criteria, using the distal internal carotid diameter as the denominator. CONTRAST:  141mL OMNIPAQUE IOHEXOL 350 MG/ML SOLN COMPARISON:  Head CT earlier same day.  The FINDINGS: CTA NECK FINDINGS Aortic arch: Aortic atherosclerosis. Branching pattern is normal without origin stenosis. Right carotid system: Common carotid artery widely patent to the bifurcation. Carotid bifurcation shows mild atherosclerotic plaque but no stenosis. Cervical ICA is widely patent. Left carotid system: Common carotid artery is widely patent to the bifurcation. Carotid bifurcation shows atherosclerotic plaque but no stenosis. Cervical ICA is widely patent. Vertebral arteries: The left vertebral artery is dominant. Atherosclerotic plaque at the dominant left vertebral artery origin with stenosis of 70%. Small right vertebral artery widely patent at the origin. Beyond the origin stenosis on the left, both vertebral arteries are widely patent through the cervical region to the foramen magnum. Skeleton: Ordinary cervical spondylosis. Other neck: No mass or lymphadenopathy. Upper chest: Ordinary pleural and parenchymal scarring. Review of the MIP images confirms the above findings CTA HEAD FINDINGS Anterior circulation: Both internal carotid arteries are patent through the skull base and siphon regions. There is atherosclerotic calcification in the carotid siphon regions but no stenosis greater than 30%. The anterior and middle cerebral vessels are patent without proximal stenosis, aneurysm or vascular malformation. No large or  medium vessel occlusion. Posterior circulation: Both vertebral arteries are patent at the foramen magnum level. The small right vertebral artery terminates in PICA. The left vertebral artery supplies the basilar without stenosis. No basilar stenosis. Posterior circulation branch vessels are patent and normal. Venous sinuses: Patent and normal. Anatomic variants: None significant. Review of the MIP images confirms the above findings IMPRESSION: No acute large or medium vessel occlusion. No significant intracranial stenotic disease. Atherosclerotic disease at both carotid bifurcations but no stenosis. 70% stenosis at the dominant left vertebral artery origin. These results were communicated to Dr. Cheral Marker at 7:28 pmon 1/1/2021by text page via the [facility] messaging system. Electronically Signed   By: Nelson Chimes M.D.   On: 11/14/2019 19:28   CT Code Stroke CTA Neck W/WO contrast  Result Date: 11/14/2019 CLINICAL DATA:  Aphasia.  Last seen normal 1700 hours. EXAM: CT ANGIOGRAPHY HEAD AND NECK TECHNIQUE: Multidetector CT imaging of the head and neck was performed using the standard protocol during bolus administration of intravenous contrast. Multiplanar CT image reconstructions and MIPs were obtained to evaluate the vascular anatomy. Carotid stenosis measurements (when applicable) are obtained utilizing NASCET criteria, using the distal internal carotid diameter as the denominator. CONTRAST:  125mL OMNIPAQUE IOHEXOL 350 MG/ML SOLN COMPARISON:  Head CT earlier same day.  The FINDINGS: CTA NECK FINDINGS Aortic arch: Aortic atherosclerosis. Branching pattern is normal without origin stenosis. Right carotid system: Common carotid artery widely patent to the bifurcation. Carotid bifurcation shows mild atherosclerotic plaque but no stenosis. Cervical ICA is widely patent. Left carotid system: Common carotid artery is widely patent to the bifurcation. Carotid bifurcation shows atherosclerotic plaque but no stenosis. Cervical  ICA is widely patent. Vertebral arteries: The left vertebral artery  is dominant. Atherosclerotic plaque at the dominant left vertebral artery origin with stenosis of 70%. Small right vertebral artery widely patent at the origin. Beyond the origin stenosis on the left, both vertebral arteries are widely patent through the cervical region to the foramen magnum. Skeleton: Ordinary cervical spondylosis. Other neck: No mass or lymphadenopathy. Upper chest: Ordinary pleural and parenchymal scarring. Review of the MIP images confirms the above findings CTA HEAD FINDINGS Anterior circulation: Both internal carotid arteries are patent through the skull base and siphon regions. There is atherosclerotic calcification in the carotid siphon regions but no stenosis greater than 30%. The anterior and middle cerebral vessels are patent without proximal stenosis, aneurysm or vascular malformation. No large or medium vessel occlusion. Posterior circulation: Both vertebral arteries are patent at the foramen magnum level. The small right vertebral artery terminates in PICA. The left vertebral artery supplies the basilar without stenosis. No basilar stenosis. Posterior circulation branch vessels are patent and normal. Venous sinuses: Patent and normal. Anatomic variants: None significant. Review of the MIP images confirms the above findings IMPRESSION: No acute large or medium vessel occlusion. No significant intracranial stenotic disease. Atherosclerotic disease at both carotid bifurcations but no stenosis. 70% stenosis at the dominant left vertebral artery origin. These results were communicated to Dr. Cheral Marker at 7:28 pmon 1/1/2021by text page via the [facility] messaging system. Electronically Signed   By: Nelson Chimes M.D.   On: 11/14/2019 19:28   DG Chest Portable 1 View  Result Date: 11/14/2019 CLINICAL DATA:  Code stroke. EXAM: PORTABLE CHEST 1 VIEW COMPARISON:  Chest radiograph 09/25/2019 FINDINGS: Stable cardiomediastinal  contours with normal heart size. Stable calcified left hilar lymph nodes. The lungs are clear. No pneumothorax or large pleural effusion. No acute finding in the visualized skeleton. IMPRESSION: No acute cardiopulmonary process. Electronically Signed   By: Audie Pinto M.D.   On: 11/14/2019 19:53   CT HEAD CODE STROKE WO CONTRAST  Result Date: 11/14/2019 CLINICAL DATA:  Code stroke.  Aphasia.  Last seen normal 1700 hours. EXAM: CT HEAD WITHOUT CONTRAST TECHNIQUE: Contiguous axial images were obtained from the base of the skull through the vertex without intravenous contrast. COMPARISON:  09/25/2019 FINDINGS: Brain: Age related atrophy. Old ischemic changes of the pontomedullary junction region. Old small vessel cerebellar infarctions. Cerebral hemispheres show age related atrophy with extensive chronic small vessel disease of the white matter. Presumed artifactual low-density in the right frontal region. No sign of mass lesion, hemorrhage, hydrocephalus or extra-axial collection. Vascular: There is atherosclerotic calcification of the major vessels at the base of the brain. Skull: Negative Sinuses/Orbits: Clear/normal Other: Old healed facial fractures on the right. ASPECTS [facility] Stroke Program Early CT Score) - Ganglionic level infarction (caudate, lentiform nuclei, internal capsule, insula, M1-M3 cortex): 7 - Supraganglionic infarction (M4-M6 cortex): 3 Total score (0-10 with 10 being normal): 10 IMPRESSION: 1. No acute finding by CT. Atrophy and chronic small-vessel ischemic changes. 2. ASPECTS is 10 3. These results were communicated to Dr. Cheral Marker at [place] 1/1/2021by text page via the [facility] messaging system. Electronically Signed   By: Nelson Chimes M.D.   On: 11/14/2019 19:16    EKG: Independently reviewed. Sinus rhythm, PVC's, QTc 489 ms.   Assessment/Plan   1. Acute word-finding difficulty  - Presents with word-finding difficulty, noted by wife at [phone] after seen normal ~1700   - He  was not tPA candidate d/t thrombocytopenia  - CT head negative for acute findings and no emergent large- or medium-vessel occlusion or  significant intracranial stenoses noted on CTA  - Neurology is consulting and much appreciated  - Check MRI brain, echocardiogram, blood cultures, UA  - Continue cardiac monitoring, frequent neuro checks, NPO pending swallow screen, and consult PT/OT/SLP - Continue ASA and statin    2. CAD  - No anginal complaints  - Continue ASA and statin    3. Thrombocytopenia  - Platelets 69k on admission, up from recent priors and with no bleeding  - Follows with hematology-oncology, suspected secondary to ITP, has not required any intervention    4. Bullous pemphigoid  - Started on MTX by dermatology a few months ago, currently held    5. Hx of recurrent bacteremia  - Recent hx of Enterococcal bacteremia, recurred after 4 wks ampicillin, has now completed 6 wks of Rocephin & ampicillin, TTE negative for vegetations and TEE unsuccessful, now on suppressive amoxicillin indefinitely per ID  - Blood cultures collected in ED, continue amoxicillin 500 mg BID    6. Prostate cancer  - Castrate-resistant, metastatic to bone  - Had been on Xtandi, pharmacy medication-reconciliation currently pending     DVT prophylaxis: SCD's  Code Status: DNR, confirmed with patient   Family Communication: Discussed with patient  Consults called: Neurology  Admission status: Observation     Vianne Bulls, MD [facility] Pager [phone]  If 7PM-7AM, please contact night-coverage [email or web address] Password [facility]  11/14/2019, 9:37 PM

## 2019-11-15 ENCOUNTER — Observation Stay (HOSPITAL_BASED_OUTPATIENT_CLINIC_OR_DEPARTMENT_OTHER): Payer: Medicare Other

## 2019-11-15 ENCOUNTER — Inpatient Hospital Stay (HOSPITAL_COMMUNITY): Payer: Medicare Other

## 2019-11-15 ENCOUNTER — Other Ambulatory Visit (HOSPITAL_COMMUNITY): Payer: Medicare Other

## 2019-11-15 DIAGNOSIS — Z807 Family history of other malignant neoplasms of lymphoid, hematopoietic and related tissues: Secondary | ICD-10-CM | POA: Diagnosis not present

## 2019-11-15 DIAGNOSIS — C7951 Secondary malignant neoplasm of bone: Secondary | ICD-10-CM | POA: Diagnosis present

## 2019-11-15 DIAGNOSIS — R7881 Bacteremia: Secondary | ICD-10-CM

## 2019-11-15 DIAGNOSIS — Z888 Allergy status to other drugs, medicaments and biological substances status: Secondary | ICD-10-CM | POA: Diagnosis not present

## 2019-11-15 DIAGNOSIS — C61 Malignant neoplasm of prostate: Secondary | ICD-10-CM | POA: Diagnosis present

## 2019-11-15 DIAGNOSIS — D693 Immune thrombocytopenic purpura: Secondary | ICD-10-CM | POA: Diagnosis present

## 2019-11-15 DIAGNOSIS — I361 Nonrheumatic tricuspid (valve) insufficiency: Secondary | ICD-10-CM | POA: Diagnosis not present

## 2019-11-15 DIAGNOSIS — G459 Transient cerebral ischemic attack, unspecified: Secondary | ICD-10-CM | POA: Diagnosis not present

## 2019-11-15 DIAGNOSIS — R471 Dysarthria and anarthria: Secondary | ICD-10-CM | POA: Diagnosis present

## 2019-11-15 DIAGNOSIS — Z808 Family history of malignant neoplasm of other organs or systems: Secondary | ICD-10-CM | POA: Diagnosis not present

## 2019-11-15 DIAGNOSIS — I2510 Atherosclerotic heart disease of native coronary artery without angina pectoris: Secondary | ICD-10-CM | POA: Diagnosis present

## 2019-11-15 DIAGNOSIS — Z7982 Long term (current) use of aspirin: Secondary | ICD-10-CM | POA: Diagnosis not present

## 2019-11-15 DIAGNOSIS — R4701 Aphasia: Secondary | ICD-10-CM | POA: Diagnosis present

## 2019-11-15 DIAGNOSIS — Z86006 Personal history of melanoma in-situ: Secondary | ICD-10-CM | POA: Diagnosis not present

## 2019-11-15 DIAGNOSIS — L120 Bullous pemphigoid: Secondary | ICD-10-CM | POA: Diagnosis present

## 2019-11-15 DIAGNOSIS — I340 Nonrheumatic mitral (valve) insufficiency: Secondary | ICD-10-CM | POA: Diagnosis not present

## 2019-11-15 DIAGNOSIS — I38 Endocarditis, valve unspecified: Secondary | ICD-10-CM

## 2019-11-15 DIAGNOSIS — Z818 Family history of other mental and behavioral disorders: Secondary | ICD-10-CM | POA: Diagnosis not present

## 2019-11-15 DIAGNOSIS — I350 Nonrheumatic aortic (valve) stenosis: Secondary | ICD-10-CM | POA: Diagnosis not present

## 2019-11-15 DIAGNOSIS — I25119 Atherosclerotic heart disease of native coronary artery with unspecified angina pectoris: Secondary | ICD-10-CM | POA: Diagnosis not present

## 2019-11-15 DIAGNOSIS — E785 Hyperlipidemia, unspecified: Secondary | ICD-10-CM | POA: Diagnosis present

## 2019-11-15 DIAGNOSIS — Z66 Do not resuscitate: Secondary | ICD-10-CM | POA: Diagnosis present

## 2019-11-15 DIAGNOSIS — K219 Gastro-esophageal reflux disease without esophagitis: Secondary | ICD-10-CM | POA: Diagnosis present

## 2019-11-15 DIAGNOSIS — Z85820 Personal history of malignant melanoma of skin: Secondary | ICD-10-CM | POA: Diagnosis not present

## 2019-11-15 DIAGNOSIS — R4789 Other speech disturbances: Secondary | ICD-10-CM

## 2019-11-15 DIAGNOSIS — Z20822 Contact with and (suspected) exposure to covid-19: Secondary | ICD-10-CM | POA: Diagnosis present

## 2019-11-15 DIAGNOSIS — Z87891 Personal history of nicotine dependence: Secondary | ICD-10-CM | POA: Diagnosis not present

## 2019-11-15 DIAGNOSIS — L8990 Pressure ulcer of unspecified site, unspecified stage: Secondary | ICD-10-CM | POA: Insufficient documentation

## 2019-11-15 DIAGNOSIS — I700 Atherosclerosis of aorta: Secondary | ICD-10-CM | POA: Diagnosis present

## 2019-11-15 DIAGNOSIS — H8110 Benign paroxysmal vertigo, unspecified ear: Secondary | ICD-10-CM | POA: Diagnosis present

## 2019-11-15 DIAGNOSIS — Z923 Personal history of irradiation: Secondary | ICD-10-CM | POA: Diagnosis not present

## 2019-11-15 DIAGNOSIS — B952 Enterococcus as the cause of diseases classified elsewhere: Secondary | ICD-10-CM

## 2019-11-15 LAB — HEMOGLOBIN A1C
Hgb A1c MFr Bld: 4.8 % (ref 4.8–5.6)
Mean Plasma Glucose: 91.06 mg/dL

## 2019-11-15 LAB — URINALYSIS, ROUTINE W REFLEX MICROSCOPIC
Bacteria, UA: NONE SEEN
Bilirubin Urine: NEGATIVE
Glucose, UA: NEGATIVE mg/dL
Ketones, ur: NEGATIVE mg/dL
Leukocytes,Ua: NEGATIVE
Nitrite: NEGATIVE
Protein, ur: NEGATIVE mg/dL
RBC / HPF: >50 RBC/hpf — ABNORMAL HIGH (ref 0–5)
Specific Gravity, Urine: 1.028 (ref 1.005–1.030)
pH: 7 (ref 5.0–8.0)

## 2019-11-15 LAB — LIPID PANEL
Cholesterol: 166 mg/dL (ref 0–200)
HDL: 46 mg/dL (ref >40)
LDL Cholesterol: 108 mg/dL — ABNORMAL HIGH (ref 0–99)
Total CHOL/HDL Ratio: 3.6 RATIO
Triglycerides: 60 mg/dL (ref <150)
VLDL: 12 mg/dL (ref 0–40)

## 2019-11-15 LAB — AMMONIA: Ammonia: 15 umol/L (ref 9–35)

## 2019-11-15 LAB — ECHOCARDIOGRAM LIMITED: Weight: 2137.58 oz

## 2019-11-15 LAB — SARS CORONAVIRUS 2 (TAT 6-24 HRS): SARS Coronavirus 2: NEGATIVE

## 2019-11-15 MED ORDER — IOHEXOL 350 MG/ML SOLN
80.0000 mL | Freq: Once | INTRAVENOUS | Status: AC | PRN
  Administered 2019-11-15: 80 mL via INTRAVENOUS

## 2019-11-15 NOTE — Progress Notes (Signed)
PROGRESS NOTE    Corey Oneill  [id number] DOB: January 09, 1936 DOA: 11/14/2019 PCP: [name], MD   Brief Narrative:  Corey Oneill is a 84 y.o. 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  Patient's wife provided most of the history, noting that the patient seemed to be in his usual state at 5 PM when she left the house.  Upon returning night around 6:30 PM, patient was not responding to her basic questions and his speech was not intelligible.  He has not exhibited the symptoms previously, but his wife noted a history of "staring off" episodes.  Patient's family notes that Corey. Schreiner has been generally weak and frail since a series of hospital admissions over the past few months. While still in the emergency department, patient reports that he feels back to his baseline, describes "a very strange sensation" earlier this evening marked by an inability to speak.  Patient denied having any headache or focal numbness or weakness during the episode, did not feel confused, but was unable to get the words out.  There has not been any recent chest pain, palpitations, and he also denies fevers or chills recently. Upon arrival to the ED, patient is found to be afebrile, saturating well, and with stable blood pressure.  EKG features sinus rhythm with PVCs.  Chest x-ray is negative for acute cardiopulmonary disease.  Noncontrast head CT is negative for acute intracranial abnormality.  There is no acute large or medium vessel occlusion, or significant intracranial stenoses on CTA head and neck.  Chemistry panel largely unremarkable and CBC notable for a stable normocytic anemia and chronic thrombocytopenia.  Blood cultures were collected in the  emergency department.  Covid antigen test was negative and PCR test pending.  Neurology has seen the patient in the emergency department and recommends further evaluation of possible CVA/TIA or toxic metabolic encephalopathy.   Assessment & Plan:   Principal Problem:   Word finding difficulty Active Problems:   Prostate cancer ([facility] [place])   CAD (coronary artery disease)   Bullous pemphigoid   Bacteremia due to Enterococcus   Idiopathic thrombocytopenic purpura (ITP) ([facility])   Pressure injury of skin   Acute word-finding difficulty, likely TIA, resolved, POA Cannot rule out endocarditis as etiology (see below) - Presents with word-finding difficulty, noted by wife at [phone] after seen normal ~1700   - He was not tPA candidate d/t thrombocytopenia  - CT head negative for acute findings and no emergent large- or medium-vessel occlusion or significant intracranial stenoses noted on CTA  - Neurology following - appreciate insight/recs - MRI brain with no acute process  - echocardiogram shows ? Thickened bioprosthetic valve - Consider escalating from asa to plavix but per discussion with consults patient would be high risk for DAPT and certainly would not be candidate for full dose anticoagulation at this point  History of recurrent enterococcal bacteremia, cannot rule out endocarditis -Patient unable to tolerate TEE previously, has been on amoxicillin per cardiology/infectious disease prophylactically (completed previous abx course with recurrence and unable to rule out cardiac vegetations previously due to failed TEE) -Cardiology following as above recommending cardiac CT given abnormal TTE as above and unable to complete TEE previously due to hernia -Pending findings patient may need further evaluation for valve replacement, but would be high risk  given advanced age and comorbid conditions as above. Appreciate cardiology's insight given this very complex case.  History of CAD  - No anginal  complaints  - Continue ASA and statin  for now, as above could consider Plavix in lieu of aspirin given TIA  Thrombocytopenia  CBC Latest Ref Rng & Units 11/14/2019 11/14/2019 09/28/2019  WBC 4.0 - 10.5 K/uL - 4.2 3.1(L)  Hemoglobin 13.0 - 17.0 g/dL 11.2(L) 11.1(L) 10.7(L)  Hematocrit 39.0 - 52.0 % 33.0(L) 33.2(L) 30.9(L)  Platelets 150 - 400 K/uL - 69(L) 50(L)  - Platelets quite labile over the last year, ranging from 30-90k -No current signs or symptoms of bleeding at this point - Follows with hematology-oncology, suspected secondary to ITP, has not required any intervention    Bullous pemphigoid  - Started on MTX by dermatology a few months ago, currently held    Prostate cancer  - Castrate-resistant, metastatic to bone  - Had been on [place], pharmacy medication-reconciliation currently pending     DVT prophylaxis: SCD's  Code Status: DNR, confirmed at admission  Family Communication: Discussed with patient  Consults called: Neurology, cardiology Admission status:  Patient will be converted to inpatient status, continues to require further work-up with both cardiology and neurology given above symptoms including but not limited to cardiac CT, possible need for cardiac valve replacement pending further findings.  Subjective: No acute issues or events, patient feels quite well, requesting discharge home, explained that he needs continued observation and work-up with cardiology as above. Otherwise declines chest pain, shortness of breath, nausea, vomiting, diarrhea, constipation, headache, fevers, chills.  Objective: Vitals:   11/15/19 0333 11/15/19 0601 11/15/19 0751 11/15/19 1327  BP: 131/74 123/75 (!) 99/59 (!) 122/94  Pulse: 75 73 69 66  Resp:  14 17 18   Temp: (!) 97.4 F (36.3 C) 97.8 F (36.6 C) (!) 97.2 F (36.2 C) 97.8 F (36.6 C)  TempSrc: Oral Oral Oral Oral  SpO2: 98% 99% 99% 98%  Weight:       No intake or output data in the 24 hours ending 11/15/19  1351 Filed Weights   11/14/19 1916  Weight: 60.6 kg    Examination:  General:  Pleasantly resting in bed, No acute distress. HEENT:  Normocephalic atraumatic.  Sclerae nonicteric, noninjected.  Extraocular movements intact bilaterally. Neck:  Without mass or deformity.  Trachea is midline. Lungs:  Clear to auscultate bilaterally without rhonchi, wheeze, or rales. Heart:  Regular rate and rhythm. Systolic murmur 3/6. Abdomen:  Soft, nontender, nondistended.  Without guarding or rebound. Extremities: Without cyanosis, clubbing, edema, or obvious deformity. Vascular:  Dorsalis pedis and posterior tibial pulses palpable bilaterally. Skin:  Warm and dry, no erythema, no ulcerations.  Data Reviewed: I have personally reviewed following labs and imaging studies  CBC: Recent Labs  Lab 11/14/19 1901 11/14/19 1903  WBC 4.2  --   NEUTROABS 2.6  --   HGB 11.1* 11.2*  HCT 33.2* 33.0*  MCV 98.8  --   PLT 69*  --    Basic Metabolic Panel: Recent Labs  Lab 11/14/19 1901 11/14/19 1903  NA 136 137  K 4.0 4.0  CL 101 98  CO2 23  --   GLUCOSE 110* 107*  BUN 16 18  CREATININE 0.85 0.80  CALCIUM 8.8*  --    GFR: Estimated Creatinine Clearance: 62.1 mL/min (by C-G formula based on SCr of 0.8 mg/dL). Liver Function Tests: Recent Labs  Lab 11/14/19 1901  AST 17  ALT 9  ALKPHOS 72  BILITOT 0.5  PROT 6.3*  ALBUMIN 3.1*   No results for input(s): LIPASE, AMYLASE in the last 168 hours. Recent Labs  Lab 11/15/19 0002  AMMONIA 15   Coagulation Profile: Recent Labs  Lab 11/14/19 1901  INR 1.1   Cardiac Enzymes: No results for input(s): CKTOTAL, CKMB, CKMBINDEX, TROPONINI in the last 168 hours. BNP (last 3 results) No results for input(s): PROBNP in the last 8760 hours. HbA1C: Recent Labs    11/15/19 0002  HGBA1C 4.8   CBG: Recent Labs  Lab 11/14/19 1856  GLUCAP 73   Lipid Profile: Recent Labs    11/15/19 0002  CHOL 166  HDL 46  LDLCALC 108*  TRIG 60   CHOLHDL 3.6   Thyroid Function Tests: No results for input(s): TSH, T4TOTAL, FREET4, T3FREE, THYROIDAB in the last 72 hours. Anemia Panel: No results for input(s): VITAMINB12, FOLATE, FERRITIN, TIBC, IRON, RETICCTPCT in the last 72 hours. Sepsis Labs: No results for input(s): PROCALCITON, LATICACIDVEN in the last 168 hours.  Recent Results (from the past 240 hour(s))  Culture, blood (routine x 2)     Status: None (Preliminary result)   Collection Time: 11/14/19  7:40 PM   Specimen: BLOOD  Result Value Ref Range Status   Specimen Description BLOOD LEFT ANTECUBITAL  Final   Special Requests   Final    BOTTLES DRAWN AEROBIC AND ANAEROBIC Blood Culture adequate volume   Culture   Final    NO GROWTH < 12 HOURS Performed at [facility], [address] [place], [place] [address]    Report Status PENDING  Incomplete  Culture, blood (routine x 2)     Status: None (Preliminary result)   Collection Time: 11/14/19  8:01 PM   Specimen: BLOOD  Result Value Ref Range Status   Specimen Description BLOOD RIGHT ANTECUBITAL  Final   Special Requests   Final    BOTTLES DRAWN AEROBIC AND ANAEROBIC Blood Culture adequate volume   Culture   Final    NO GROWTH < 12 HOURS Performed at [facility], [place] [address], [place], [place] [address]    Report Status PENDING  Incomplete  SARS CORONAVIRUS 2 (TAT 6-24 HRS) Nasopharyngeal Nasopharyngeal Swab     Status: None   Collection Time: 11/14/19  9:16 PM   Specimen: Nasopharyngeal Swab  Result Value Ref Range Status   SARS Coronavirus 2 NEGATIVE NEGATIVE Final    Comment: (NOTE) SARS-CoV-2 target nucleic acids are NOT DETECTED. The SARS-CoV-2 RNA is generally detectable in upper and lower respiratory specimens during the acute phase of infection. Negative results do not preclude SARS-CoV-2 infection, do not rule out co-infections with other pathogens, and should not be used as the sole basis for treatment or other patient management  decisions. Negative results must be combined with clinical observations, patient history, and epidemiological information. The expected result is Negative. Fact Sheet for Patients: SugarRoll.be Fact Sheet for Healthcare Providers: https://www.woods-mathews.com/ This test is not yet approved or cleared by the [place] FDA and  has been authorized for detection and/or diagnosis of SARS-CoV-2 by FDA under an Emergency Use Authorization (EUA). This EUA will remain  in effect (meaning this test can be used) for the duration of the COVID-19 declaration under Section 56 4(b)(1) of the Act, 21 U.S.C. section 360bbb-3(b)(1), unless the authorization is terminated or revoked sooner. Performed at [facility], [place] [address] [place], [place] [address]          Radiology Studies:  CT Code Stroke CTA Head W/WO Oneill  Result Date: 11/14/2019 CLINICAL DATA:  Aphasia.  Last seen normal 1700 hours. EXAM: CT ANGIOGRAPHY HEAD AND NECK TECHNIQUE: Multidetector CT imaging of the head and neck was performed using the standard protocol during bolus administration of intravenous Oneill. Multiplanar CT image reconstructions and MIPs were obtained to evaluate the vascular anatomy. Carotid stenosis measurements (when applicable) are obtained utilizing NASCET criteria, using the distal internal carotid diameter as the denominator. Oneill:  170mL OMNIPAQUE IOHEXOL 350 MG/ML SOLN COMPARISON:  Head CT earlier same day.  The FINDINGS: CTA NECK FINDINGS Aortic arch: Aortic atherosclerosis. Branching pattern is normal without origin stenosis. Right carotid system: Common carotid artery widely patent to the bifurcation. Carotid bifurcation shows mild atherosclerotic plaque but no stenosis. Cervical ICA is widely patent. Left carotid system: Common carotid artery is widely patent to the bifurcation. Carotid bifurcation shows atherosclerotic plaque but no stenosis.  Cervical ICA is widely patent. Vertebral arteries: The left vertebral artery is dominant. Atherosclerotic plaque at the dominant left vertebral artery origin with stenosis of 70%. Small right vertebral artery widely patent at the origin. Beyond the origin stenosis on the left, both vertebral arteries are widely patent through the cervical region to the foramen magnum. Skeleton: Ordinary cervical spondylosis. Other neck: No mass or lymphadenopathy. Upper chest: Ordinary pleural and parenchymal scarring. Review of the MIP images confirms the above findings CTA HEAD FINDINGS Anterior circulation: Both internal carotid arteries are patent through the skull base and siphon regions. There is atherosclerotic calcification in the carotid siphon regions but no stenosis greater than 30%. The anterior and middle cerebral vessels are patent without proximal stenosis, aneurysm or vascular malformation. No large or medium vessel occlusion. Posterior circulation: Both vertebral arteries are patent at the foramen magnum level. The small right vertebral artery terminates in PICA. The left vertebral artery supplies the basilar without stenosis. No basilar stenosis. Posterior circulation branch vessels are patent and normal. Venous sinuses: Patent and normal. Anatomic variants: None significant. Review of the MIP images confirms the above findings IMPRESSION: No acute large or medium vessel occlusion. No significant intracranial stenotic disease. Atherosclerotic disease at both carotid bifurcations but no stenosis. 70% stenosis at the dominant left vertebral artery origin. These results were communicated to Dr. Cheral Marker at 7:28 pmon 1/1/2021by text page via the [facility] messaging system. Electronically Signed   By: Nelson Chimes M.D.   On: 11/14/2019 19:28   CT Code Stroke CTA Neck W/WO Oneill  Result Date: 11/14/2019 CLINICAL DATA:  Aphasia.  Last seen normal 1700 hours. EXAM: CT ANGIOGRAPHY HEAD AND NECK TECHNIQUE: Multidetector CT  imaging of the head and neck was performed using the standard protocol during bolus administration of intravenous Oneill. Multiplanar CT image reconstructions and MIPs were obtained to evaluate the vascular anatomy. Carotid stenosis measurements (when applicable) are obtained utilizing NASCET criteria, using the distal internal carotid diameter as the denominator. Oneill:  15mL OMNIPAQUE IOHEXOL 350 MG/ML SOLN COMPARISON:  Head CT earlier same day.  The FINDINGS: CTA NECK FINDINGS Aortic arch: Aortic atherosclerosis. Branching pattern is normal without origin stenosis. Right carotid system: Common carotid artery widely patent to the bifurcation. Carotid bifurcation shows mild atherosclerotic plaque but no stenosis. Cervical ICA is widely patent. Left carotid system: Common carotid artery is widely patent to the bifurcation. Carotid bifurcation shows atherosclerotic plaque but no stenosis. Cervical ICA is widely patent. Vertebral arteries: The left vertebral artery is dominant. Atherosclerotic plaque at the dominant left vertebral artery origin with stenosis of  70%. Small right vertebral artery widely patent at the origin. Beyond the origin stenosis on the left, both vertebral arteries are widely patent through the cervical region to the foramen magnum. Skeleton: Ordinary cervical spondylosis. Other neck: No mass or lymphadenopathy. Upper chest: Ordinary pleural and parenchymal scarring. Review of the MIP images confirms the above findings CTA HEAD FINDINGS Anterior circulation: Both internal carotid arteries are patent through the skull base and siphon regions. There is atherosclerotic calcification in the carotid siphon regions but no stenosis greater than 30%. The anterior and middle cerebral vessels are patent without proximal stenosis, aneurysm or vascular malformation. No large or medium vessel occlusion. Posterior circulation: Both vertebral arteries are patent at the foramen magnum level. The small right  vertebral artery terminates in PICA. The left vertebral artery supplies the basilar without stenosis. No basilar stenosis. Posterior circulation branch vessels are patent and normal. Venous sinuses: Patent and normal. Anatomic variants: None significant. Review of the MIP images confirms the above findings IMPRESSION: No acute large or medium vessel occlusion. No significant intracranial stenotic disease. Atherosclerotic disease at both carotid bifurcations but no stenosis. 70% stenosis at the dominant left vertebral artery origin. These results were communicated to Dr. Cheral Marker at 7:28 pmon 1/1/2021by text page via the [facility] messaging system. Electronically Signed   By: Nelson Chimes M.D.   On: 11/14/2019 19:28   Corey Oneill  Result Date: 11/14/2019 CLINICAL DATA:  Initial evaluation for focal neural deficit, stroke suspected. EXAM: MRI HEAD WITHOUT Oneill TECHNIQUE: Multiplanar, multiecho pulse sequences of the brain and surrounding structures were obtained without intravenous Oneill. COMPARISON:  Prior CTs from earlier the same day. FINDINGS: Brain: Moderately advanced cerebral atrophy, most pronounced at the anterior temporal lobes bilaterally. Patchy T2/FLAIR hyperintensity within the periventricular and deep white matter both cerebral hemispheres most consistent with chronic small vessel ischemic disease, moderate in nature. Few scatter remote lacunar infarcts noted at the left caudate and hemispheric cerebral white matter. No abnormal foci of restricted diffusion to suggest acute or subacute ischemia. Gray-white matter differentiation well maintained. No encephalomalacia to suggest chronic infarction. No evidence for acute intracranial hemorrhage. Few scattered chronic micro hemorrhages noted involving the supratentorial cerebral white matter, likely small vessel related. No mass lesion, midline shift or mass effect. No hydrocephalus. No extra-axial fluid collection. Major dural sinuses are  grossly patent. Pituitary gland and suprasellar region are normal. Midline structures intact and normal. Vascular: Major intracranial vascular flow voids well maintained. Hypoplastic right vertebral artery noted. Skull and upper cervical spine: Craniocervical junction normal. Visualized upper cervical spine within normal limits. Bone marrow signal intensity normal. No scalp soft tissue abnormality. Sinuses/Orbits: Patient status post bilateral ocular lens replacement. Globes and orbital soft tissues demonstrate no acute finding. Paranasal sinuses are clear. Moderate left with small right mastoid effusions noted, likely benign. Visualized nasopharynx within normal limits. Inner ear structures grossly normal. Other: None. IMPRESSION: 1. No acute intracranial abnormality. 2. Moderately advanced cerebral atrophy with chronic small vessel ischemic disease. Electronically Signed   By: Jeannine Boga M.D.   On: 11/14/2019 22:54   DG Chest Portable 1 View  Result Date: 11/14/2019 CLINICAL DATA:  Code stroke. EXAM: PORTABLE CHEST 1 VIEW COMPARISON:  Chest radiograph 09/25/2019 FINDINGS: Stable cardiomediastinal contours with normal heart size. Stable calcified left hilar lymph nodes. The lungs are clear. No pneumothorax or large pleural effusion. No acute finding in the visualized skeleton. IMPRESSION: No acute cardiopulmonary process. Electronically Signed   By: Jac Canavan.D.  On: 11/14/2019 19:53   CT HEAD CODE STROKE WO Oneill  Result Date: 11/14/2019 CLINICAL DATA:  Code stroke.  Aphasia.  Last seen normal 1700 hours. EXAM: CT HEAD WITHOUT Oneill TECHNIQUE: Contiguous axial images were obtained from the base of the skull through the vertex without intravenous Oneill. COMPARISON:  09/25/2019 FINDINGS: Brain: Age related atrophy. Old ischemic changes of the pontomedullary junction region. Old small vessel cerebellar infarctions. Cerebral hemispheres show age related atrophy with extensive  chronic small vessel disease of the white matter. Presumed artifactual low-density in the right frontal region. No sign of mass lesion, hemorrhage, hydrocephalus or extra-axial collection. Vascular: There is atherosclerotic calcification of the major vessels at the base of the brain. Skull: Negative Sinuses/Orbits: Clear/normal Other: Old healed facial fractures on the right. ASPECTS [facility] Stroke Program Early CT Score) - Ganglionic level infarction (caudate, lentiform nuclei, internal capsule, insula, M1-M3 cortex): 7 - Supraganglionic infarction (M4-M6 cortex): 3 Total score (0-10 with 10 being normal): 10 IMPRESSION: 1. No acute finding by CT. Atrophy and chronic small-vessel ischemic changes. 2. ASPECTS is 10 3. These results were communicated to Dr. Cheral Marker at [place] 1/1/2021by text page via the [facility] messaging system. Electronically Signed   By: Nelson Chimes M.D.   On: 11/14/2019 19:16   ECHOCARDIOGRAM LIMITED  Result Date: 11/15/2019   ECHOCARDIOGRAM LIMITED REPORT   Patient Name:   Corey Oneill Date of Exam: 11/15/2019 Medical Rec #:  [id number]     Height:       70.0 in Accession #:    [id number]    Weight:       133.6 lb Date of Birth:  1936/07/05     BSA:          1.76 m Patient Age:    86 years      BP:           99/59 mmHg Patient Gender: M             HR:           74 bpm. Exam Location:  Inpatient  Procedure: Limited Echo, Limited Color Doppler and Cardiac Doppler Indications:    bacteremia 790.7  History:        Patient has prior history of Echocardiogram examinations, most                 recent 09/26/2019. Aortic Valve: A 45 Edwards Edwards Sapien                 bioprosthetic, stented aortic valve (TAVR)  Sonographer:    Johny Chess Referring PhysLW:[id number] TIMOTHY S OPYD  Sonographer Comments: Technically difficult study due to poor echo windows and no parasternal window. Image acquisition challenging due to respiratory motion. IMPRESSIONS  1. Left ventricular ejection fraction, by  visual estimation, is 65 to 70%.  2. Left ventricular diastolic parameters are consistent with Grade I diastolic dysfunction (impaired relaxation).  3. Mild mitral annular calcification.  4. Mild mitral valve regurgitation.  5. Moderate thickening of the mitral valve leaflet(s).  6. Tricuspid valve regurgitation is mild.  7. Tricuspid valve regurgitation is mild.  8. Bioprosthetic TAVR valve leaflets appear thickened - this is new from the study on 09/26/2019. Transaortic gradients couldn't be appropriately assessed. TEE is recommended. FINDINGS  Left Ventricle: Left ventricular ejection fraction, by visual estimation, is 65 to 70%. Left ventricular diastolic parameters are consistent with Grade I diastolic dysfunction (impaired relaxation). Mitral Valve: There is moderate thickening of the mitral  valve leaflet(s). Mild mitral annular calcification. MV Area by PHT, 2.13 cm. MV PHT, 103.24 msec. Mild mitral valve regurgitation. Tricuspid Valve: Tricuspid valve regurgitation is mild. Aortic Valve: 26 Edwards Edwards Sapien bioprosthetic, stented aortic valve (TAVR) valve is present in the aortic position.  LEFT VENTRICLE          Normals PLAX 2D LVIDd:         3.70 cm  3.6 cm LVIDs:         2.10 cm  1.7 cm LV PW:         1.00 cm  1.4 cm LV IVS:        1.00 cm  1.3 cm LVOT diam:     2.25 cm  2.0 cm LV SV:         44 ml    79 ml LV SV Index:   25.42    45 ml/m2 LVOT Area:     3.98 cm 3.14 cm2  LEFT ATRIUM         Index LA diam:    3.20 cm 1.82 cm/m   AORTA                 Normals Ao Root diam: 3.30 cm 31 mm MITRAL VALVE               Normals MV Area (PHT): 2.13 cm             SHUNTS MV PHT:        103.24 msec 55 ms    Systemic Diam: 2.25 cm MV Decel Time: 356 msec    187 ms MV E velocity: 64.70 cm/s 103 cm/s MV A velocity: 76.70 cm/s 70.3 cm/s MV E/A ratio:  0.84       1.5  Ena Dawley MD Electronically signed by Ena Dawley MD Signature Date/Time: 11/15/2019/11:49:01 AM    Final    Scheduled Meds:   amoxicillin  500 mg Oral Q12H   aspirin EC  81 mg Oral Daily   rosuvastatin  10 mg Oral q1800   Continuous Infusions:   LOS: 0 days   Time spent: 62 min  Little Ishikawa, DO [facility]  If 7PM-7AM, please contact night-coverage [email or web address] Password [email or web address] 11/15/2019, 1:51 PM

## 2019-11-15 NOTE — Plan of Care (Signed)

## 2019-11-15 NOTE — Evaluation (Addendum)
Physical Therapy Evaluation Patient Details Name: Corey Oneill MRN: [id number] DOB: [birth year] Today's Date: 11/15/2019   History of Present Illness  Pt is an 84 y/o male admitted secondary to word finding difficulties. MRI of the brain was negative for any acute findings. 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    Clinical Impression  Pt presented supine in bed with HOB elevated, awake and willing to participate in therapy session. Prior to admission, pt reported that he ambulated with use of an RW and was independent with ADLs. Pt lives with his wife in a single level townhouse with a couple of steps to enter. At the time of evaluation, pt overall at a min guard level with functional mobility with use of RW to ambulate in hallway. Pt would continue to benefit from skilled physical therapy services at this time while admitted and after d/c to address the below listed limitations in order to improve overall safety and independence with functional mobility.     Follow Up Recommendations Home health PT    Equipment Recommendations  None recommended by PT    Recommendations for Other Services       Precautions / Restrictions Precautions Precautions: Fall Restrictions Weight Bearing Restrictions: No      Mobility  Bed Mobility Overal bed mobility: Needs Assistance Bed Mobility: Supine to Sit;Sit to Supine     Supine to sit: Min guard Sit to supine: Min guard   General bed mobility comments: increased time and effort, min guard for safety  Transfers Overall transfer level: Needs assistance Equipment used: Rolling walker (2 wheeled);None Transfers: Sit to/from Stand Sit to Stand: Min guard         General transfer comment: increased time and effort, min guard; performed x1 from EOB without an  AD and then x1 with RW  Ambulation/Gait Ambulation/Gait assistance: Min guard Gait Distance (Feet): 100 Feet Assistive device: Rolling walker (2 wheeled) Gait Pattern/deviations: Step-through pattern;Decreased stride length Gait velocity: able to fluctuate   General Gait Details: pt with mild instability but no overt LOB or need for physical assistance; close min guard for safety and cueing to maintain proximity to [employer] Mobility    Modified Rankin (Stroke Patients Only)       Balance Overall balance assessment: Needs assistance Sitting-balance support: Feet supported Sitting balance-Leahy Scale: Good     Standing balance support: Bilateral upper extremity supported;Single extremity supported Standing balance-Leahy Scale: Poor                               Pertinent Vitals/Pain Pain Assessment: No/denies pain    Home Living Family/patient expects to be discharged to:: Private residence Living Arrangements: Spouse/significant other Available Help at Discharge: Family;Available 24 hours/day Type of Home: Other(Comment)(townhouse) Home Access: Stairs to enter Entrance Stairs-Rails: Right Entrance Stairs-Number of Steps: 2 Home Layout: One level Home Equipment: Walker - 2 wheels;Cane - single point      Prior Function Level of Independence: Independent with assistive device(s)         Comments: ambulates with RW     Hand Dominance        Extremity/Trunk Assessment   Upper Extremity Assessment Upper Extremity Assessment: Generalized weakness    Lower Extremity Assessment Lower  Extremity Assessment: Generalized weakness    Cervical / Trunk Assessment Cervical / Trunk Assessment: Kyphotic  Communication   Communication: No difficulties  Cognition Arousal/Alertness: Awake/alert Behavior During Therapy: WFL for tasks assessed/performed Overall Cognitive Status: Impaired/Different from baseline Area of  Impairment: Memory;Safety/judgement;Problem solving                     Memory: Decreased short-term memory;Decreased recall of precautions   Safety/Judgement: Decreased awareness of safety;Decreased awareness of deficits   Problem Solving: Difficulty sequencing;Requires verbal cues        General Comments      Exercises     Assessment/Plan    PT Assessment Patient needs continued PT services  PT Problem List Decreased balance;Decreased mobility;Decreased coordination;Decreased safety awareness;Decreased knowledge of precautions       PT Treatment Interventions DME instruction;Gait training;Stair training;Functional mobility training;Therapeutic activities;Therapeutic exercise;Balance training;Neuromuscular re-education;Patient/family education    PT Goals (Current goals can be found in the Care Plan section)  Acute Rehab PT Goals Patient Stated Goal: to return home and get back to normal PT Goal Formulation: With patient Time For Goal Achievement: 11/29/19 Potential to Achieve Goals: Good    Frequency Min 3X/week   Barriers to discharge        Co-evaluation               AM-PAC PT "6 Clicks" Mobility  Outcome Measure Help needed turning from your back to your side while in a flat bed without using bedrails?: None Help needed moving from lying on your back to sitting on the side of a flat bed without using bedrails?: A Little Help needed moving to and from a bed to a chair (including a wheelchair)?: A Little Help needed standing up from a chair using your arms (e.g., wheelchair or bedside chair)?: A Little Help needed to walk in hospital room?: A Little Help needed climbing 3-5 steps with a railing? : A Lot 6 Click Score: 18    End of Session Equipment Utilized During Treatment: Gait belt Activity Tolerance: Patient tolerated treatment well Patient left: in bed;with call bell/phone within reach;with bed alarm set;with SCD's reapplied Nurse  Communication: Mobility status PT Visit Diagnosis: Other abnormalities of gait and mobility (R26.89)    Time: [id number] PT Time Calculation (min) (ACUTE ONLY): 28 min   Charges:   PT Evaluation $PT Eval Moderate Complexity: 1 Mod PT Treatments $Gait Training: 8-22 mins        Anastasio Champion, DPT  Acute Rehabilitation Services Pager [phone] Office [place] 11/15/2019, 10:28 AM

## 2019-11-15 NOTE — Progress Notes (Addendum)
STROKE TEAM PROGRESS NOTE   HISTORY OF PRESENT ILLNESS (per record) Corey Oneill is a 84 y.o. male past medical history of significant resevere aortic stenosis status post TAVR, ITP, prostate cancer status post external beam radiation with mets to the left iliac bone, radiation prostatitis, incontinence for many months to years, hyperlipidemia, depression, coronary artery disease, chronic diastolic congestive heart failure, bullous pemphigus on methotrexate, macrocytic anemia, prior history of alcohol abuse presented to the hospital via EMS for sudden onset of speech difficulty. Last known normal 5 PM when wife went to the grocery store leaving him home.  Upon her return, she found him to be confused eyes open, not responding to questions and speech making no sense. No focal tingling numbness or weakness reported.  No prior history of similar episode but there have been episodes where he stares off in space which has happened a few times in the past according to the wife. He was recently diagnosed with Enterococcus bacteremia and discharged from the hospital on September 30, 2019, following which she was in therapy and was discharged home on Christmas Eve.  The family says that he is not been himself ever since his admission to [facility] in November for the bacteremia and has been very frail and weak. This past summer, he was walking his dogs and driving independently.  He was admitted at first to [facility] in October, with sepsis.  Then again presented to [facility] in November with sepsis.  He follows with ID has an outpatient, who are treating him for presumptive bacteremia and endocarditis as they cannot do a TEE due to his ITP and with his history of TAVR.   LKW: 5 PM today tpa given?: no, platelet count below 100,000 Premorbid modified Rankin scale (mRS): Over the past few months-3-4.  Prior to that 1   INTERVAL HISTORY His daughter is at the bedside.  I discussed that  MRI of the brain did not show any strokes however I do suspect that this was a TIA.  We also discussed his thrombocytopenia, we can still change to Plavix however we do worry about the risk of bleeding.  He is being seen by cardiology and they may have different recommendations based on echocardiogram and possible TEE.    OBJECTIVE Vitals:   11/15/19 0333 11/15/19 0601 11/15/19 0751 11/15/19 1327  BP: 131/74 123/75 (!) 99/59 (!) 122/94  Pulse: 75 73 69 66  Resp:  14 17 18   Temp: (!) 97.4 F (36.3 C) 97.8 F (36.6 C) (!) 97.2 F (36.2 C) 97.8 F (36.6 C)  TempSrc: Oral Oral Oral Oral  SpO2: 98% 99% 99% 98%  Weight:        CBC:  Recent Labs  Lab 11/14/19 1901 11/14/19 1903  WBC 4.2  --   NEUTROABS 2.6  --   HGB 11.1* 11.2*  HCT 33.2* 33.0*  MCV 98.8  --   PLT 69*  --     Basic Metabolic Panel:  Recent Labs  Lab 11/14/19 1901 11/14/19 1903  NA 136 137  K 4.0 4.0  CL 101 98  CO2 23  --   GLUCOSE 110* 107*  BUN 16 18  CREATININE 0.85 0.80  CALCIUM 8.8*  --     Lipid Panel:     Component Value Date/Time   CHOL 166 11/15/2019 0002   TRIG 60 11/15/2019 0002   HDL 46 11/15/2019 0002   CHOLHDL 3.6 11/15/2019 0002   VLDL 12 11/15/2019  0002   LDLCALC 108 (H) 11/15/2019 0002   HgbA1c:  Lab Results  Component Value Date   HGBA1C 4.8 11/15/2019   Urine Drug Screen: No results found for: LABOPIA, COCAINSCRNUR, LABBENZ, AMPHETMU, THCU, LABBARB  Alcohol Level     Component Value Date/Time   ETH <10 09/25/2019 1425    IMAGING  CT Code Stroke CTA Head W/WO contrast CT Code Stroke CTA Neck W/WO contrast 11/14/2019 IMPRESSION:  No acute large or medium vessel occlusion. No significant intracranial stenotic disease. Atherosclerotic disease at both carotid bifurcations but no stenosis. 70% stenosis at the dominant left vertebral artery origin.   MR BRAIN WO CONTRAST 11/14/2019 IMPRESSION:  1. No acute intracranial abnormality.  2. Moderately advanced cerebral  atrophy with chronic small vessel ischemic disease.   DG Chest Portable 1 View 11/14/2019 IMPRESSION:  No acute cardiopulmonary process.   CT HEAD CODE STROKE WO CONTRAST 11/14/2019 IMPRESSION:  1. No acute finding by CT. Atrophy and chronic small-vessel ischemic changes.  2. ASPECTS is 10    ECHOCARDIOGRAM LIMITED 11/15/2019 IMPRESSIONS   1. Left ventricular ejection fraction, by visual estimation, is 65 to 70%.   2. Left ventricular diastolic parameters are consistent with Grade I diastolic dysfunction (impaired relaxation).   3. Mild mitral annular calcification.   4. Mild mitral valve regurgitation.   5. Moderate thickening of the mitral valve leaflet(s).   6. Tricuspid valve regurgitation is mild.   7. Tricuspid valve regurgitation is mild.   8. Bioprosthetic TAVR valve leaflets appear thickened - this is new from the study on 09/26/2019. Transaortic gradients couldn't be appropriately assessed. TEE is recommended.   ECG - SR rate 78 BPM. (See cardiology reading for complete details)   PHYSICAL EXAM Blood pressure (!) 122/94, pulse 66, temperature 97.8 F (36.6 C), temperature source Oral, resp. rate 18, weight 60.6 kg, SpO2 98 %.  Awake, alert, NAD, head atraumatic, RRR, abdomen soft.  Exam: NAD, pleasant                  Speech:    Speech is slightly aphasic Cognition:    The patient is oriented to person, recent and remote memory impaired    Cranial Nerves:    The pupils are equal, round, and reactive to light.Trigeminal sensation is intact and the muscles of mastication are normal. The face is symmetric. The palate elevates in the midline. Hearing intact. Voice is normal. Shoulder shrug is normal. The tongue has normal motion without fasciculations.   Coordination:  No dysmetria or ataxia  Motor Observation:    No asymmetry, no atrophy, and no involuntary movements noted. Tone:    Normal muscle tone.     Strength:    Strength is antigravity and equal in the  upper and lower limbs.      Sensation: intact to LT   ASSESSMENT/PLAN Mr. Corey Oneill is a 84 y.o. male with history of aortic stenosis status post TAVR, ITP, prostate cancer status post external beam radiation with mets to the left iliac bone, radiation prostatitis, incontinence for many months to years, hyperlipidemia, depression, coronary artery disease, hearing impaired,  chronic diastolic congestive heart failure, bullous pemphigus on methotrexate, macrocytic anemia, thrombocytopenia, prior history of alcohol abuse presented to the hospital via EMS for sudden onset of speech difficulty. He did not receive IV t-PA due to low platelet count.  Probable TIA:    Resultant: questionable slight aphasia  Code Stroke CT Head - No acute finding by CT. Atrophy and chronic  small-vessel ischemic changes. ASPECTS is 10   CT head - not ordered  MRI head  - no acute findings. Moderately advanced cerebral atrophy with chronic small vessel ischemic disease.   MRA head - not ordered  CTA H&N - no acute findings. 70% stenosis at the dominant left vertebral artery origin.   CT Perfusion - not ordered  Carotid Doppler - CTA neck performed - carotid dopplers not indicated.  2D Echo - EF 60 - 65%. No cardiac source of emboli identified. Further evaluation recommended by cardiology R/O endocarditis, cardiology on board   [facility] Virus 2 - negative  LDL - 108  HgbA1c - 4.8  UDS - not ordered  VTE prophylaxis - SCDs Diet  Diet Order            Diet Heart Room service appropriate? Yes; Fluid consistency: Thin  Diet effective now              aspirin 81 mg daily prior to admission, now on aspirin 81 mg daily  Patient counseled to be compliant with his antithrombotic medications.   Ongoing aggressive stroke risk factor management  Therapy recommendations:  pending  Disposition:  Pending  Hypertension  Home BP meds: Coreg  Current BP meds: none   Stable . Permissive  hypertension (OK if < 220/120) but gradually normalize in 5-7 days . Long-term BP goal normotensive  Hyperlipidemia  Home Lipid lowering medication: Crestor 10 mg daily  LDL 108, goal < 70  Current lipid lowering medication: Crestor 10 mg daily - Consider increasing to 20 mg daily (or 40 mg daily)  Continue statin at discharge  Other Stroke Risk Factors  Advanced age  Former cigarette smoker - quit  ETOH use, advised to drink no more than 1 alcoholic beverage per day.  Coronary artery disease  Diastolic CHF  Other Active Problems  Thrombocytopenia - 69 K (hx of thrombocytopenia)  Mild anemia - Hb - 11.2   Hospital day # 0  This is an 84 year old male with a TIA on aspirin, he has a very complicated past medical history including recent bacteremia and prolonged hospital stay, ITP with chronic thrombocytopenia, severe aortic stenosis status post TAVR in 2017, prostate cancer with mets to the left iliac bone, hyperlipidemia, chronic congestive heart failure, coronary artery disease with current questionable endocarditis.  At this point cardiology is on board.  I had a discussion with Dr. Holli Humbles.  From the neurology perspective given his TIA, we would recommend changing his aspirin to Plavix and managing his vascular risk factors (given his thrombocytopenia we would not recommend dual antiplatelet therapy).  However we will have to defer to cardiology on any changes of medications and/or changes to his stroke preventative given the possibility of endocarditis and further work-up by cardiology ongoing.  Neurology will sign off at this time but do not hesitate to reconsult [place] if needed.  I did discuss with his attending Dr. Avon Gully as well as with patient and daughter at bedside.  We will defer to cardiology at this time.  Personally examined patient and images, and have participated in and made any corrections needed to history, physical, neuro exam,assessment and plan as  stated above.  I have personally obtained the history, evaluated lab date, reviewed imaging studies and agree with radiology interpretations.    Sarina Ill, MD Stroke Neurology   A total of 35 minutes was spent for the care of this patient, spent on counseling patient and family on different diagnostic and  therapeutic options, counseling and coordination of care, riskd ans benefits of management, compliance, or risk factor reduction and education.  To contact Stroke Continuity provider, please refer to [email or web address] After hours, contact General Neurology

## 2019-11-15 NOTE — Consult Note (Addendum)
Cardiology Consultation:   Patient ID: MAVEN CHALMERS MRN: [id number]; DOB: [birth year]  Admit date: 11/14/2019 Date of Consult: 11/15/2019  Primary Care Provider: [name], MD Primary Cardiologist: [name], MD   Patient Profile:   Corey Oneill is a 84 y.o. male with a hx of CAD, severe aortic stenosis, status post TAVR in 2017 who is being seen today for the evaluation of bacteremia and TIA at the request of Rinehuls, Early Chars, PA-C.  History of Present Illness:   Mr. Antczak is an 84 year old male hx of coronary artery disease with PCI of the RCA and aortic stenosis status post TAVR in 2017, ITP with chronic thrombocytopenia, prostate cancer status post external beam radiation with mets to the left iliac bone, radiation prostatitis, hyperlipidemia, chronic diastolic CHF.[name]    He is a patient of Dr. Burt Knack, last seen in August 2024 chronic atypical angina with most recent cardiac catheterization demonstrating stability of his coronary artery disease and patency of his previously implanted RCA stent. The patient has also been followed for metastatic prostate CA by Dr Junious Silk and Dr Alen Blew, and by hematology for thrombocytopenia.   The patient developed Enterococcus bacteremia and was admitted on September 30, 2019, TEE was attempted however images are not obtained as he has large hiatal hernia.  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  He was discharged on November 06, 2019, he has been followed by Dr. Linus Salmons from [place] recommended oral suppressive therapy with amoxicillin 500 mg twice a day indefinitely.  Yesterday the patient developed symptoms of slurred speech, inability to talk, confusion, otherwise no neurological deficit.  He was brought to the ER and CT showed no acute findings but chronic ischemic changes.  Heart Pathway Score:     Past Medical History:  Diagnosis Date  . Abdominal aortic atherosclerosis ([place])   . Anginal pain ([place])    last  noted 08/17/19  . Bone cancer ([place])    Left hip  . BPPV (benign paroxysmal positional vertigo)   . Bullous pemphigoid   . CAD (coronary artery disease)    a.  LHC 8/16: Mid to distal LAD 30%, OM1 40%, proximal mid RCA 40%, distal RCA 60% >> FFR 0.69  >> PCI: 3 x 15 mm Resolute DES  . Depression   . Esophageal reflux   . Family history of adverse reaction to anesthesia    "daughter has PONV"  . Grade I diastolic dysfunction [id number]   Noted on ECHO   . Grade I diastolic dysfunction [id number]   Noted on ECHO  . H/O blood clots    "get them in my stool and urine" (11/12/2015)  . Heart murmur   . Hepatic lesion 12/08/2015   Stable 8 mm right hepatic lesion  . History of aortic stenosis    a. peak to peak gradient by LHC 8/16:  37 mmHg (moderately severe)   . History of appendicitis 2016  . History of blood transfusion 12/2018  . History of herpes labialis   . History of ITP    2018, thrombocytopenia  . History of kidney stones   . Hx of radiation therapy 04/14/13-06/09/13   prostate 7800 cGy, 40 sessions, seminal vesicles 5600 cGy 40 sessions  . Hydrocele 2006   Small  . Hyperlipidemia   . Insomnia    resolved  . LVH (left ventricular hypertrophy)    Moderate, noted on ECHO 09/2018  . Malignant melanoma in situ ([place]) 09/04/2016   Right neck and  chest  . Melanoma ([place])    pt denies  . Metastasis from malignant neoplasm of prostate ([place]) 02/25/2019  . Prostate cancer ([place]) dx'd 2014  . Radiation proctitis   . Renal mass 2017   Bilateral renal masses  . S/P skin biopsy 04/09/2019   subepidermal cell poor vesicle , Linear IGG and C3 the basement membrane  . Suprapubic catheter ([place])   . Wears hearing aid in both ears     Past Surgical History:  Procedure Laterality Date  . CARDIAC CATHETERIZATION N/A 04/21/2015   Procedure: Right/Left Heart Cath and Coronary Angiography;  Surgeon: [name], MD;  Location: [place] [facility];  Service: Cardiovascular;  Laterality: N/A;    . CARDIAC CATHETERIZATION N/A 06/18/2015   Procedure: Intravascular Pressure Wire/FFR Study;  Surgeon: [name], MD;  Location: [place] [facility];  Service: Cardiovascular;  Laterality: N/A;  . CARDIAC CATHETERIZATION N/A 06/18/2015   Procedure: Coronary Stent Intervention;  Surgeon: [name], MD;  Location: [place] [facility];  Service: Cardiovascular;  Laterality: N/A;  . CARDIAC CATHETERIZATION N/A 06/18/2015   Procedure: Right/Left Heart Cath and Coronary Angiography;  Surgeon: [name], MD;  Location: [place] [facility];  Service: Cardiovascular;  Laterality: N/A;  . CARDIAC CATHETERIZATION N/A 06/23/2015   Procedure: Left Heart Cath and Cors/Grafts Angiography;  Surgeon: [name], MD;  Location: [place] [facility];  Service: Cardiovascular;  Laterality: N/A;  . CARDIAC CATHETERIZATION N/A 01/17/2016   Procedure: Right/Left Heart Cath and Coronary Angiography;  Surgeon: [name], MD;  Location: [place] [facility];  Service: Cardiovascular;  Laterality: N/A;  . CATARACT EXTRACTION, BILATERAL    . COLONOSCOPY  06/26/2017  . CYSTOSCOPY WITH BIOPSY N/A 07/30/2018   Procedure: CYSTOSCOPY WITH BIOPSY/FULGURATION, CYSTOLTHOLAPAXY;  Surgeon: [name], MD;  Location: [facility];  Service: Urology;  Laterality: N/A;  . CYSTOSCOPY WITH URETHRAL DILATATION N/A 08/18/2019   Procedure: CYSTOSCOPY WITH URETHRAL DILATATION USING BALLOON OR LASER/ SPURO PUBIC TUBE CHANGE LASER EXCISION OF URETHRAL STRICTURE RETROGRADE URETHROGRAM ANTEGRADE CYSTOGRAM;  Surgeon: [name], MD;  Location: [facility];  Service: Urology;  Laterality: N/A;  . FRACTURE SURGERY    . INGUINAL HERNIA REPAIR     patient does not remember this procedure  . LAPAROSCOPIC APPENDECTOMY N/A 11/16/2015   Procedure: APPENDECTOMY LAPAROSCOPIC;  Surgeon: [name], MD;  Location: [place];  Service: General;  Laterality: N/A;  . LEFT HEART CATH AND CORONARY ANGIOGRAPHY N/A 12/26/2016   Procedure:  Left Heart Cath and Coronary Angiography;  Surgeon: Troy Sine, MD;  Location: [place] [facility];  Service: Cardiovascular;  Laterality: N/A;  . MELANOMA EXCISION Right 10/24/2016   Procedure: WIDE EXCISION MELANOMA RIGHT NECK AND RIGHT CHEST TIMES 2;  Surgeon: [name], MD;  Location: [place];  Service: General;  Laterality: Right;  right medial and lateral lesion of chest and right neck  . NASAL FRACTURE SURGERY     "broken years ago; several ORs to correct it"  . NASAL SEPTUM SURGERY    . PROSTATE BIOPSY  2014   "needle biopsy"  . TEE WITHOUT CARDIOVERSION N/A 02/15/2016   Procedure: TRANSESOPHAGEAL ECHOCARDIOGRAM (TEE);  Surgeon: [name], MD;  Location: [place];  Service: Open Heart Surgery;  Laterality: N/A;  . TEE WITHOUT CARDIOVERSION N/A 08/26/2019   Procedure: TRANSESOPHAGEAL ECHOCARDIOGRAM (TEE);  Surgeon: Pixie Casino, MD;  Location[facility];  Service: Cardiovascular;  Laterality: N/A;  . TONSILLECTOMY    .  TRANSCATHETER AORTIC VALVE REPLACEMENT, TRANSFEMORAL  02/15/2016  . TRANSCATHETER AORTIC VALVE REPLACEMENT, TRANSFEMORAL N/A 02/15/2016   Procedure: TRANSCATHETER AORTIC VALVE REPLACEMENT, TRANSFEMORAL;  Surgeon: [name], MD;  Location: [place];  Service: Open Heart Surgery;  Laterality: N/A;  . TRANSURETHRAL RESECTION OF PROSTATE  12/23/2018   Procedure: CYSTOSCOPY WITH  BLADDER BIOPSY WITH FULGERATION/ TRANSURETHRAL RESECTION PROSTATE  AND PROSTATE BIOPSY;  Surgeon: [name], MD;  Location: [facility];  Service: Urology;;      Inpatient Medications: Scheduled Meds: . amoxicillin  500 mg Oral Q12H  . aspirin EC  81 mg Oral Daily  . rosuvastatin  10 mg Oral q1800   Continuous Infusions:  PRN Meds: acetaminophen **OR** acetaminophen (TYLENOL) oral liquid 160 mg/5 mL **OR** acetaminophen, senna-docusate  Allergies:    Allergies  Allergen Reactions  . Prednisone Other (See Comments)    Makes depressed    Social History:    Social History   Socioeconomic History  . Marital status: Married    Spouse name: Not on file  . Number of children: 2  . Years of education: Not on file  . Highest education level: Not on file  Occupational History  . Not on file  Tobacco Use  . Smoking status: Former Smoker    Packs/day: 0.00    Types: Cigarettes  . Smokeless tobacco: Never Used  . Tobacco comment: "quit smoking cigarettes in the 1960s; quit smoking cigars in the 1990s  Substance and Sexual Activity  . Alcohol use: Yes    Comment: 2 DRINKS PER DAY  . Drug use: No  . Sexual activity: Not on file  Other Topics Concern  . Not on file  Social History Narrative  . Not on file   Social Determinants of Health   Financial Resource Strain:   . Difficulty of Paying Living Expenses: Not on file  Food Insecurity:   . Worried About Charity fundraiser in the Last Year: Not on file  . Ran Out of Food in the Last Year: Not on file  Transportation Needs:   . Lack of Transportation (Medical): Not on file  . Lack of Transportation (Non-Medical): Not on file  Physical Activity:   . Days of Exercise per Week: Not on file  . Minutes of Exercise per Session: Not on file  Stress:   . Feeling of Stress : Not on file  Social Connections:   . Frequency of Communication with Friends and Family: Not on file  . Frequency of Social Gatherings with Friends and Family: Not on file  . Attends Religious Services: Not on file  . Active Member of Clubs or Organizations: Not on file  . Attends Archivist Meetings: Not on file  . Marital Status: Not on file  Intimate Partner Violence:   . Fear of Current or Ex-Partner: Not on file  . Emotionally Abused: Not on file  . Physically Abused: Not on file  . Sexually Abused: Not on file    Family History:    Family History  Problem Relation Age of Onset  . Brain cancer Father   . Lymphoma Mother   . Depression Daughter     ROS:  Please see the history of present  illness.  All other ROS reviewed and negative.     Physical Exam/Data:   Vitals:   11/15/19 0206 11/15/19 0333 11/15/19 0601 11/15/19 0751  BP: (!) 156/76 131/74 123/75 (!) 99/59  Pulse: 78 75 73 69  Resp: 14  14 17  Temp: 97.6 F (36.4 C) (!) 97.4 F (36.3 C) 97.8 F (36.6 C) (!) 97.2 F (36.2 C)  TempSrc: Oral Oral Oral Oral  SpO2: 99% 98% 99% 99%  Weight:       No intake or output data in the 24 hours ending 11/15/19 1215 Last 3 Weights 11/14/2019 10/27/2019 09/26/2019  Weight (lbs) 133 lb 9.6 oz 141 lb 139 lb 8.8 oz  Weight (kg) 60.6 kg 63.957 kg 63.3 kg     Body mass index is 19.17 kg/m.  General:  Well nourished, well developed, in no acute distress, the patient is hard of hearing and appears slightly confused HEENT: normal Lymph: no adenopathy Neck: no JVD Endocrine:  No thryomegaly Vascular: No carotid bruits; FA pulses 2+ bilaterally without bruits  Cardiac:  normal S1, S2; RRR; 2/6 systolic murmur in the right upper sternal border Lungs:  clear to auscultation bilaterally, no wheezing, rhonchi or rales  Abd: soft, nontender, no hepatomegaly  Ext: no edema Musculoskeletal:  No deformities, BUE and BLE strength normal and equal Skin: warm and dry  Neuro:  CNs 2-12 intact, no focal abnormalities noted Psych:  Normal affect   EKG:  The EKG was personally reviewed and demonstrates: Sinus rhythm, PACs, no acute ischemic changes. Telemetry:  Telemetry was personally reviewed and demonstrates: Sinus rhythm with ventricular rates in 60s  Relevant CV Studies:  1. Left ventricular ejection fraction, by visual estimation, is 65 to 70%.  2. Left ventricular diastolic parameters are consistent with Grade I diastolic dysfunction (impaired relaxation).  3. Mild mitral annular calcification.  4. Mild mitral valve regurgitation.  5. Moderate thickening of the mitral valve leaflet(s).  6. Tricuspid valve regurgitation is mild.  7. Tricuspid valve regurgitation is mild.  8.  Bioprosthetic TAVR valve leaflets appear thickened - this is new from the study on 09/26/2019. Transaortic gradients couldn't be appropriately assessed. TEE is recommended.  Laboratory Data:  High Sensitivity Troponin:  No results for input(s): TROPONINIHS in the last 720 hours.   Chemistry Recent Labs  Lab 11/14/19 1901 11/14/19 1903  NA 136 137  K 4.0 4.0  CL 101 98  CO2 23  --   GLUCOSE 110* 107*  BUN 16 18  CREATININE 0.85 0.80  CALCIUM 8.8*  --   GFRNONAA >60  --   GFRAA >60  --   ANIONGAP 12  --     Recent Labs  Lab 11/14/19 1901  PROT 6.3*  ALBUMIN 3.1*  AST 17  ALT 9  ALKPHOS 72  BILITOT 0.5   Hematology Recent Labs  Lab 11/14/19 1901 11/14/19 1903  WBC 4.2  --   RBC 3.36*  --   HGB 11.1* 11.2*  HCT 33.2* 33.0*  MCV 98.8  --   MCH 33.0  --   MCHC 33.4  --   RDW 14.3  --   PLT 69*  --    BNPNo results for input(s): BNP, PROBNP in the last 168 hours.  DDimer No results for input(s): DDIMER in the last 168 hours.   Radiology/Studies:  CT Code Stroke CTA Head W/WO contrast  Result Date: 11/14/2019 CLINICAL DATA:  Aphasia.  Last seen normal 1700 hours. EXAM: CT ANGIOGRAPHY HEAD AND NECK TECHNIQUE: Multidetector CT imaging of the head and neck was performed using the standard protocol during bolus administration of intravenous contrast. Multiplanar CT image reconstructions and MIPs were obtained to evaluate the vascular anatomy. Carotid stenosis measurements (when applicable) are obtained utilizing NASCET criteria, using the distal internal  carotid diameter as the denominator. CONTRAST:  157mL OMNIPAQUE IOHEXOL 350 MG/ML SOLN COMPARISON:  Head CT earlier same day.  The FINDINGS: CTA NECK FINDINGS Aortic arch: Aortic atherosclerosis. Branching pattern is normal without origin stenosis. Right carotid system: Common carotid artery widely patent to the bifurcation. Carotid bifurcation shows mild atherosclerotic plaque but no stenosis. Cervical ICA is widely patent.  Left carotid system: Common carotid artery is widely patent to the bifurcation. Carotid bifurcation shows atherosclerotic plaque but no stenosis. Cervical ICA is widely patent. Vertebral arteries: The left vertebral artery is dominant. Atherosclerotic plaque at the dominant left vertebral artery origin with stenosis of 70%. Small right vertebral artery widely patent at the origin. Beyond the origin stenosis on the left, both vertebral arteries are widely patent through the cervical region to the foramen magnum. Skeleton: Ordinary cervical spondylosis. Other neck: No mass or lymphadenopathy. Upper chest: Ordinary pleural and parenchymal scarring. Review of the MIP images confirms the above findings CTA HEAD FINDINGS Anterior circulation: Both internal carotid arteries are patent through the skull base and siphon regions. There is atherosclerotic calcification in the carotid siphon regions but no stenosis greater than 30%. The anterior and middle cerebral vessels are patent without proximal stenosis, aneurysm or vascular malformation. No large or medium vessel occlusion. Posterior circulation: Both vertebral arteries are patent at the foramen magnum level. The small right vertebral artery terminates in PICA. The left vertebral artery supplies the basilar without stenosis. No basilar stenosis. Posterior circulation branch vessels are patent and normal. Venous sinuses: Patent and normal. Anatomic variants: None significant. Review of the MIP images confirms the above findings IMPRESSION: No acute large or medium vessel occlusion. No significant intracranial stenotic disease. Atherosclerotic disease at both carotid bifurcations but no stenosis. 70% stenosis at the dominant left vertebral artery origin. These results were communicated to Dr. Cheral Marker at 7:28 pmon 1/1/2021by text page via the [facility] messaging system. Electronically Signed   By:  Chimes M.D.   On: 11/14/2019 19:28   CT Code Stroke CTA Neck W/WO  contrast  Result Date: 11/14/2019 CLINICAL DATA:  Aphasia.  Last seen normal 1700 hours. EXAM: CT ANGIOGRAPHY HEAD AND NECK TECHNIQUE: Multidetector CT imaging of the head and neck was performed using the standard protocol during bolus administration of intravenous contrast. Multiplanar CT image reconstructions and MIPs were obtained to evaluate the vascular anatomy. Carotid stenosis measurements (when applicable) are obtained utilizing NASCET criteria, using the distal internal carotid diameter as the denominator. CONTRAST:  169mL OMNIPAQUE IOHEXOL 350 MG/ML SOLN COMPARISON:  Head CT earlier same day.  The FINDINGS: CTA NECK FINDINGS Aortic arch: Aortic atherosclerosis. Branching pattern is normal without origin stenosis. Right carotid system: Common carotid artery widely patent to the bifurcation. Carotid bifurcation shows mild atherosclerotic plaque but no stenosis. Cervical ICA is widely patent. Left carotid system: Common carotid artery is widely patent to the bifurcation. Carotid bifurcation shows atherosclerotic plaque but no stenosis. Cervical ICA is widely patent. Vertebral arteries: The left vertebral artery is dominant. Atherosclerotic plaque at the dominant left vertebral artery origin with stenosis of 70%. Small right vertebral artery widely patent at the origin. Beyond the origin stenosis on the left, both vertebral arteries are widely patent through the cervical region to the foramen magnum. Skeleton: Ordinary cervical spondylosis. Other neck: No mass or lymphadenopathy. Upper chest: Ordinary pleural and parenchymal scarring. Review of the MIP images confirms the above findings CTA HEAD FINDINGS Anterior circulation: Both internal carotid arteries are patent through the skull base and siphon regions.  There is atherosclerotic calcification in the carotid siphon regions but no stenosis greater than 30%. The anterior and middle cerebral vessels are patent without proximal stenosis, aneurysm or vascular  malformation. No large or medium vessel occlusion. Posterior circulation: Both vertebral arteries are patent at the foramen magnum level. The small right vertebral artery terminates in PICA. The left vertebral artery supplies the basilar without stenosis. No basilar stenosis. Posterior circulation branch vessels are patent and normal. Venous sinuses: Patent and normal. Anatomic variants: None significant. Review of the MIP images confirms the above findings IMPRESSION: No acute large or medium vessel occlusion. No significant intracranial stenotic disease. Atherosclerotic disease at both carotid bifurcations but no stenosis. 70% stenosis at the dominant left vertebral artery origin. These results were communicated to Dr. Cheral Marker at 7:28 pmon 1/1/2021by text page via the [facility] messaging system. Electronically Signed   By:  Chimes M.D.   On: 11/14/2019 19:28   MR BRAIN WO CONTRAST  Result Date: 11/14/2019 CLINICAL DATA:  Initial evaluation for focal neural deficit, stroke suspected. EXAM: MRI HEAD WITHOUT CONTRAST TECHNIQUE: Multiplanar, multiecho pulse sequences of the brain and surrounding structures were obtained without intravenous contrast. COMPARISON:  Prior CTs from earlier the same day. FINDINGS: Brain: Moderately advanced cerebral atrophy, most pronounced at the anterior temporal lobes bilaterally. Patchy T2/FLAIR hyperintensity within the periventricular and deep white matter both cerebral hemispheres most consistent with chronic small vessel ischemic disease, moderate in nature. Few scatter remote lacunar infarcts noted at the left caudate and hemispheric cerebral white matter. No abnormal foci of restricted diffusion to suggest acute or subacute ischemia. Gray-white matter differentiation well maintained. No encephalomalacia to suggest chronic infarction. No evidence for acute intracranial hemorrhage. Few scattered chronic micro hemorrhages noted involving the supratentorial cerebral white matter,  likely small vessel related. No mass lesion, midline shift or mass effect. No hydrocephalus. No extra-axial fluid collection. Major dural sinuses are grossly patent. Pituitary gland and suprasellar region are normal. Midline structures intact and normal. Vascular: Major intracranial vascular flow voids well maintained. Hypoplastic right vertebral artery noted. Skull and upper cervical spine: Craniocervical junction normal. Visualized upper cervical spine within normal limits. Bone marrow signal intensity normal. No scalp soft tissue abnormality. Sinuses/Orbits: Patient status post bilateral ocular lens replacement. Globes and orbital soft tissues demonstrate no acute finding. Paranasal sinuses are clear. Moderate left with small right mastoid effusions noted, likely benign. Visualized nasopharynx within normal limits. Inner ear structures grossly normal. Other: None. IMPRESSION: 1. No acute intracranial abnormality. 2. Moderately advanced cerebral atrophy with chronic small vessel ischemic disease. Electronically Signed   By: Jeannine Boga M.D.   On: 11/14/2019 22:54   DG Chest Portable 1 View  Result Date: 11/14/2019 CLINICAL DATA:  Code stroke. EXAM: PORTABLE CHEST 1 VIEW COMPARISON:  Chest radiograph 09/25/2019 FINDINGS: Stable cardiomediastinal contours with normal heart size. Stable calcified left hilar lymph nodes. The lungs are clear. No pneumothorax or large pleural effusion. No acute finding in the visualized skeleton. IMPRESSION: No acute cardiopulmonary process. Electronically Signed   By: Audie Pinto M.D.   On: 11/14/2019 19:53   CT HEAD CODE STROKE WO CONTRAST  Result Date: 11/14/2019 CLINICAL DATA:  Code stroke.  Aphasia.  Last seen normal 1700 hours. EXAM: CT HEAD WITHOUT CONTRAST TECHNIQUE: Contiguous axial images were obtained from the base of the skull through the vertex without intravenous contrast. COMPARISON:  09/25/2019 FINDINGS: Brain: Age related atrophy. Old ischemic  changes of the pontomedullary junction region. Old small vessel cerebellar infarctions. Cerebral hemispheres  show age related atrophy with extensive chronic small vessel disease of the white matter. Presumed artifactual low-density in the right frontal region. No sign of mass lesion, hemorrhage, hydrocephalus or extra-axial collection. Vascular: There is atherosclerotic calcification of the major vessels at the base of the brain. Skull: Negative Sinuses/Orbits: Clear/normal Other: Old healed facial fractures on the right. ASPECTS [facility] Stroke Program Early CT Score) - Ganglionic level infarction (caudate, lentiform nuclei, internal capsule, insula, M1-M3 cortex): 7 - Supraganglionic infarction (M4-M6 cortex): 3 Total score (0-10 with 10 being normal): 10 IMPRESSION: 1. No acute finding by CT. Atrophy and chronic small-vessel ischemic changes. 2. ASPECTS is 10 3. These results were communicated to Dr. Cheral Marker at [place] 1/1/2021by text page via the [facility] messaging system. Electronically Signed   By:  Chimes M.D.   On: 11/14/2019 19:16   ECHOCARDIOGRAM LIMITED  Result Date: 11/15/2019   ECHOCARDIOGRAM LIMITED REPORT   Patient Name:   KNOX GANTNER Date of Exam: 11/15/2019 Medical Rec #:  [id number]     Height:       70.0 in Accession #:    [id number]    Weight:       133.6 lb Date of Birth:  05-03-1936     BSA:          1.76 m Patient Age:    56 years      BP:           99/59 mmHg Patient Gender: M             HR:           74 bpm. Exam Location:  Inpatient  Procedure: Limited Echo, Limited Color Doppler and Cardiac Doppler Indications:    bacteremia 790.7  History:        Patient has prior history of Echocardiogram examinations, most                 recent 09/26/2019. Aortic Valve: A 70 Edwards Edwards Sapien                 bioprosthetic, stented aortic valve (TAVR)  Sonographer:    Johny Chess Referring PhysLW:[id number] TIMOTHY S OPYD  Sonographer Comments: Technically difficult study due to poor echo  windows and no parasternal window. Image acquisition challenging due to respiratory motion. IMPRESSIONS  1. Left ventricular ejection fraction, by visual estimation, is 65 to 70%.  2. Left ventricular diastolic parameters are consistent with Grade I diastolic dysfunction (impaired relaxation).  3. Mild mitral annular calcification.  4. Mild mitral valve regurgitation.  5. Moderate thickening of the mitral valve leaflet(s).  6. Tricuspid valve regurgitation is mild.  7. Tricuspid valve regurgitation is mild.  8. Bioprosthetic TAVR valve leaflets appear thickened - this is new from the study on 09/26/2019. Transaortic gradients couldn't be appropriately assessed. TEE is recommended. FINDINGS  Left Ventricle: Left ventricular ejection fraction, by visual estimation, is 65 to 70%. Left ventricular diastolic parameters are consistent with Grade I diastolic dysfunction (impaired relaxation). Mitral Valve: There is moderate thickening of the mitral valve leaflet(s). Mild mitral annular calcification. MV Area by PHT, 2.13 cm. MV PHT, 103.24 msec. Mild mitral valve regurgitation. Tricuspid Valve: Tricuspid valve regurgitation is mild. Aortic Valve: 26 Edwards Edwards Sapien bioprosthetic, stented aortic valve (TAVR) valve is present in the aortic position.  LEFT VENTRICLE          Normals PLAX 2D LVIDd:         3.70 cm  3.6 cm LVIDs:  2.10 cm  1.7 cm LV PW:         1.00 cm  1.4 cm LV IVS:        1.00 cm  1.3 cm LVOT diam:     2.25 cm  2.0 cm LV SV:         44 ml    79 ml LV SV Index:   25.42    45 ml/m2 LVOT Area:     3.98 cm 3.14 cm2  LEFT ATRIUM         Index LA diam:    3.20 cm 1.82 cm/m   AORTA                 Normals Ao Root diam: 3.30 cm 31 mm MITRAL VALVE               Normals MV Area (PHT): 2.13 cm             SHUNTS MV PHT:        103.24 msec 55 ms    Systemic Diam: 2.25 cm MV Decel Time: 356 msec    187 ms MV E velocity: 64.70 cm/s 103 cm/s MV A velocity: 76.70 cm/s 70.3 cm/s MV E/A ratio:  0.84        1.5  Ena Dawley MD Electronically signed by Ena Dawley MD Signature Date/Time: 11/15/2019/11:49:01 AM    Final     Assessment and Plan:   1.  TIA -CT head negative for acute findings, MRI showed no acute intracranial abnormality. Moderately advanced cerebral atrophy with chronic small vessel ischemic disease. -This is possibly secondary to endocarditis, his bioprosthetic valve leaflets appear thickened from prior study, his image quality is very limited and transaortic gradients were not obtained, there is no AI, or paravalvular leak.  The patient has ITP and chronic thrombocytopenia, besides last attempt to perform TEE was unsuccessful as he has large hiatal hernia, therefore I will obtain cardiac CT to further evaluate for any thrombus or vegetation on his TAVR valve.  2.  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 Linus Salmons Hypothetically if his scan shows a large vegetation, he will have to be discussed at her structural heart meeting next Tuesday, given that this is an elderly gentleman with multiple ongoing issues including metastatic prostate cancer, now prolonged bacteremia, overall deconditioning.  3.  CAD -Followed by Dr. Burt Knack, no acute EKG changes no chest pain.  For questions or updates, please contact [place] Please consult [email or web address] for contact info under   Signed, Ena Dawley, MD  11/15/2019 12:15 PM   Cardiac CT shows normally functioning aortic valve with good leaflet excursions, no evidence for vegetations or thrombus. Mitral valve has no vegetations. There is no thrombus within the left atrium or left atrial appendage.  The patient can be discharged home, we will arrange for a follow up with Dr Burt Knack.  Ena Dawley, MD 11/15/2019

## 2019-11-15 NOTE — Discharge Summary (Addendum)
Physician Discharge Summary  AJAX STASH [id number] DOB: 1936/03/10 DOA: 11/14/2019  PCP: [name], MD  Admit date: 11/14/2019 Discharge date: 11/15/2019  Admitted From: Home Disposition:  Home  Recommendations for Outpatient Follow-up:  1. Follow up with PCP in 1-2 weeks 2. 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 Patient's wife provided most of the history, noting that the patient seemed to be in his usual state at 5 PM when she left the house. Upon returning night around 6:30 PM, patient was not responding to her basic questions and his speech was not intelligible. He has not exhibited the symptoms previously, but his wife noted a history of "staring off" episodes. Patient's family notes that [name] has been generally weak and frail since a series of hospital admissions over the past few months. While still in the emergency department, patient reports that he feels back to his baseline, describes "a very strange sensation" earlier this evening marked by an inability to speak. Patient denied having any headache or focal numbness or weakness during the episode, did not feel confused, but was unable to get the words out. There has not been any recent chest pain, palpitations, and he also denies fevers or chills recently. Upon arrival to the ED, patient is found to be afebrile, saturating well, and with stable blood pressure. EKG features sinus rhythm with PVCs. Chest x-ray is negative for acute cardiopulmonary disease. Noncontrast head CT is  negative for acute intracranial abnormality. There is no acute large or medium vessel occlusion, or significant intracranial stenoses on CTA head and neck. Chemistry panel largely unremarkable and CBC notable for a stable normocytic anemia and chronic thrombocytopenia. Blood cultures were collected in the emergency department. Covid antigen test was negative and PCR test pending. Neurology has seen the patient in the emergency department and recommends further evaluation of possible CVA/TIA or toxic metabolic encephalopathy.  Patient admitted as above with transient episode of dysarthria with negative imaging likely TIA per discussion with Neuro and cardiology. 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 Patient unable to undergo TEE (attempted in the recent past) as such cardiac CT was performed and found to be within normal limits. Given resolution of symptoms, ability to perform and evaluate all appropriate imaging and inability to advance patient to DAPT due to chronic thrombocytopenia no medication changes were made during this hospital stay and subsequently noted to be stable for discharge today. Continue close follow up with PCP, cardiology, and ID as scheduled in the outpatient setting.   Discharge Diagnoses:  Principal Problem:   Word finding difficulty Active Problems:   Prostate cancer ([place])   CAD (coronary artery disease)   Bullous pemphigoid   Bacteremia due to Enterococcus   Idiopathic thrombocytopenic purpura (ITP) (HCC)   Pressure injury of skin   TIA (transient ischemic attack)   Discharge Instructions  Discharge Instructions    Diet - low sodium heart healthy   Complete by: As directed    Increase activity slowly   Complete by: As directed      Allergies as of 11/15/2019  Reactions   Prednisone Other (See Comments)   Makes depressed      Medication List    TAKE  these medications   acetaminophen 500 MG tablet Commonly known as: TYLENOL Take 500 mg by mouth every 6 (six) hours as needed for moderate pain.   amoxicillin 500 MG tablet Commonly known as: AMOXIL Take 1 tablet (500 mg total) by mouth 2 (two) times daily.   aspirin EC 81 MG tablet Take 81 mg by mouth at bedtime.   calcium carbonate 1250 (500 Ca) MG tablet Commonly known as: OS-CAL - dosed in mg of elemental calcium Take 1 tablet by mouth daily with breakfast.   carvedilol 3.125 MG tablet Commonly known as: COREG TAKE 1 TABLET BY MOUTH TWICE DAILY.   DULoxetine 30 MG capsule Commonly known as: CYMBALTA Take 30 mg by mouth daily.   enzalutamide 40 MG capsule Commonly known as: XTANDI Take 160 mg by mouth daily with supper.   ferrous sulfate 325 (65 FE) MG tablet Take 325 mg by mouth daily with breakfast.   folic acid 1 MG tablet Commonly known as: FOLVITE Take 1 mg by mouth See admin instructions. Everyday but Tues   isosorbide mononitrate 30 MG 24 hr tablet Commonly known as: IMDUR Take 1 tablet (30 mg total) by mouth daily.   methotrexate 2.5 MG tablet Commonly known as: RHEUMATREX Take 10 mg by mouth every Tuesday.   MULTIPLE VITAMIN PO Take 1 tablet by mouth daily.   nitroGLYCERIN 0.4 MG SL tablet Commonly known as: NITROSTAT 1 TAB UNDER TONGUE AS NEEDED FOR CHEST PAIN. MAY REPEAT EVERY 5 MIN FOR A TOTAL OF 3 DOSES. What changed: See the new instructions.   rosuvastatin 10 MG tablet Commonly known as: CRESTOR TAKE 1 TABLET ONCE DAILY.       Allergies  Allergen Reactions  . Prednisone Other (See Comments)    Makes depressed    Consultations:  Neurology, cardiology  Procedures/Studies: CT Code Stroke CTA Head W/WO contrast  Result Date: 11/14/2019 CLINICAL DATA:  Aphasia.  Last seen normal 1700 hours. EXAM: CT ANGIOGRAPHY HEAD AND NECK TECHNIQUE: Multidetector CT imaging of the head and neck was performed using the standard protocol during bolus  administration of intravenous contrast. Multiplanar CT image reconstructions and MIPs were obtained to evaluate the vascular anatomy. Carotid stenosis measurements (when applicable) are obtained utilizing NASCET criteria, using the distal internal carotid diameter as the denominator. CONTRAST:  19mL OMNIPAQUE IOHEXOL 350 MG/ML SOLN COMPARISON:  Head CT earlier same day.  The FINDINGS: CTA NECK FINDINGS Aortic arch: Aortic atherosclerosis. Branching pattern is normal without origin stenosis. Right carotid system: Common carotid artery widely patent to the bifurcation. Carotid bifurcation shows mild atherosclerotic plaque but no stenosis. Cervical ICA is widely patent. Left carotid system: Common carotid artery is widely patent to the bifurcation. Carotid bifurcation shows atherosclerotic plaque but no stenosis. Cervical ICA is widely patent. Vertebral arteries: The left vertebral artery is dominant. Atherosclerotic plaque at the dominant left vertebral artery origin with stenosis of 70%. Small right vertebral artery widely patent at the origin. Beyond the origin stenosis on the left, both vertebral arteries are widely patent through the cervical region to the foramen magnum. Skeleton: Ordinary cervical spondylosis. Other neck: No mass or lymphadenopathy. Upper chest: Ordinary pleural and parenchymal scarring. Review of the MIP images confirms the above findings CTA HEAD FINDINGS Anterior circulation: Both internal carotid arteries are patent through the skull base and siphon regions. There is atherosclerotic calcification in the carotid  siphon regions but no stenosis greater than 30%. The anterior and middle cerebral vessels are patent without proximal stenosis, aneurysm or vascular malformation. No large or medium vessel occlusion. Posterior circulation: Both vertebral arteries are patent at the foramen magnum level. The small right vertebral artery terminates in PICA. The left vertebral artery supplies the basilar  without stenosis. No basilar stenosis. Posterior circulation branch vessels are patent and normal. Venous sinuses: Patent and normal. Anatomic variants: None significant. Review of the MIP images confirms the above findings IMPRESSION: No acute large or medium vessel occlusion. No significant intracranial stenotic disease. Atherosclerotic disease at both carotid bifurcations but no stenosis. 70% stenosis at the dominant left vertebral artery origin. These results were communicated to Dr. Cheral Marker at 7:28 pmon 1/1/2021by text page via the [facility] messaging system. Electronically Signed   By: Nelson Chimes M.D.   On: 11/14/2019 19:28   CT Code Stroke CTA Neck W/WO contrast  Result Date: 11/14/2019 CLINICAL DATA:  Aphasia.  Last seen normal 1700 hours. EXAM: CT ANGIOGRAPHY HEAD AND NECK TECHNIQUE: Multidetector CT imaging of the head and neck was performed using the standard protocol during bolus administration of intravenous contrast. Multiplanar CT image reconstructions and MIPs were obtained to evaluate the vascular anatomy. Carotid stenosis measurements (when applicable) are obtained utilizing NASCET criteria, using the distal internal carotid diameter as the denominator. CONTRAST:  178mL OMNIPAQUE IOHEXOL 350 MG/ML SOLN COMPARISON:  Head CT earlier same day.  The FINDINGS: CTA NECK FINDINGS Aortic arch: Aortic atherosclerosis. Branching pattern is normal without origin stenosis. Right carotid system: Common carotid artery widely patent to the bifurcation. Carotid bifurcation shows mild atherosclerotic plaque but no stenosis. Cervical ICA is widely patent. Left carotid system: Common carotid artery is widely patent to the bifurcation. Carotid bifurcation shows atherosclerotic plaque but no stenosis. Cervical ICA is widely patent. Vertebral arteries: The left vertebral artery is dominant. Atherosclerotic plaque at the dominant left vertebral artery origin with stenosis of 70%. Small right vertebral artery widely  patent at the origin. Beyond the origin stenosis on the left, both vertebral arteries are widely patent through the cervical region to the foramen magnum. Skeleton: Ordinary cervical spondylosis. Other neck: No mass or lymphadenopathy. Upper chest: Ordinary pleural and parenchymal scarring. Review of the MIP images confirms the above findings CTA HEAD FINDINGS Anterior circulation: Both internal carotid arteries are patent through the skull base and siphon regions. There is atherosclerotic calcification in the carotid siphon regions but no stenosis greater than 30%. The anterior and middle cerebral vessels are patent without proximal stenosis, aneurysm or vascular malformation. No large or medium vessel occlusion. Posterior circulation: Both vertebral arteries are patent at the foramen magnum level. The small right vertebral artery terminates in PICA. The left vertebral artery supplies the basilar without stenosis. No basilar stenosis. Posterior circulation branch vessels are patent and normal. Venous sinuses: Patent and normal. Anatomic variants: None significant. Review of the MIP images confirms the above findings IMPRESSION: No acute large or medium vessel occlusion. No significant intracranial stenotic disease. Atherosclerotic disease at both carotid bifurcations but no stenosis. 70% stenosis at the dominant left vertebral artery origin. These results were communicated to Dr. Cheral Marker at 7:28 pmon 1/1/2021by text page via the [facility] messaging system. Electronically Signed   By: Nelson Chimes M.D.   On: 11/14/2019 19:28   MR BRAIN WO CONTRAST  Result Date: 11/14/2019 CLINICAL DATA:  Initial evaluation for focal neural deficit, stroke suspected. EXAM: MRI HEAD WITHOUT CONTRAST TECHNIQUE: Multiplanar, multiecho pulse sequences of  the brain and surrounding structures were obtained without intravenous contrast. COMPARISON:  Prior CTs from earlier the same day. FINDINGS: Brain: Moderately advanced cerebral atrophy,  most pronounced at the anterior temporal lobes bilaterally. Patchy T2/FLAIR hyperintensity within the periventricular and deep white matter both cerebral hemispheres most consistent with chronic small vessel ischemic disease, moderate in nature. Few scatter remote lacunar infarcts noted at the left caudate and hemispheric cerebral white matter. No abnormal foci of restricted diffusion to suggest acute or subacute ischemia. Gray-white matter differentiation well maintained. No encephalomalacia to suggest chronic infarction. No evidence for acute intracranial hemorrhage. Few scattered chronic micro hemorrhages noted involving the supratentorial cerebral white matter, likely small vessel related. No mass lesion, midline shift or mass effect. No hydrocephalus. No extra-axial fluid collection. Major dural sinuses are grossly patent. Pituitary gland and suprasellar region are normal. Midline structures intact and normal. Vascular: Major intracranial vascular flow voids well maintained. Hypoplastic right vertebral artery noted. Skull and upper cervical spine: Craniocervical junction normal. Visualized upper cervical spine within normal limits. Bone marrow signal intensity normal. No scalp soft tissue abnormality. Sinuses/Orbits: Patient status post bilateral ocular lens replacement. Globes and orbital soft tissues demonstrate no acute finding. Paranasal sinuses are clear. Moderate left with small right mastoid effusions noted, likely benign. Visualized nasopharynx within normal limits. Inner ear structures grossly normal. Other: None. IMPRESSION: 1. No acute intracranial abnormality. 2. Moderately advanced cerebral atrophy with chronic small vessel ischemic disease. Electronically Signed   By: Jeannine Boga M.D.   On: 11/14/2019 22:54   CT Heart Morp W/Cta Cor W/Score W/Ca W/Cm &/Or Wo/Cm  Addendum Date: 11/15/2019   ADDENDUM REPORT: 11/15/2019 17:09 CLINICAL DATA:  84 year old male with h/o severe aortic  stenosis, s/p TAVR procedure in 2017, recent E. faecalis bacteremia, now with TIA symptoms. EXAM: Cardiac TAVR CT TECHNIQUE: The patient was scanned on a [employer]. A 120 kV retrospective scan was triggered in the descending thoracic aorta at 111 HU's. Gantry rotation speed was 250 msecs and collimation was .6 mm. No beta blockade or nitro were given. The 3D data set was reconstructed in 5% intervals of the R-R cycle. Systolic and diastolic phases were analyzed on a dedicated work station using MPR, MIP and VRT modes. The patient received 80 cc of contrast. FINDINGS: Aortic Valve: 26 mm Edwards-SAPIEN 3 valve is seen in the aortic position. Leaflets have normal thickness, normal excursions and no evidence for a vegetation or a thrombus. Aorta: Normal size with mild diffuse atherosclerotic plaque and calcifications. Sinotubular Junction: 32 x 28 mm Ascending Thoracic Aorta: 35 x 34 mm Aortic Arch: Not visualized Descending Thoracic Aorta: 21 x 21 mm IMPRESSION: 1. 26 mm Edwards-SAPIEN 3 valve is seen in the aortic position. Leaflets have normal thickness, normal excursions and no evidence for a vegetation or a thrombus. 2. Mitral valve leaflets have no evidence for vegetation. 3. No thrombus is seen in the left atrium or in the left atrial appendage. Electronically Signed   By: Ena Dawley   On: 11/15/2019 17:09   Result Date: 11/15/2019 EXAM: OVER-READ INTERPRETATION  CT CHEST The following report is an over-read performed by radiologist Dr. Abigail Miyamoto of [facility], [place] on 11/15/2019. This over-read does not include interpretation of cardiac or coronary anatomy or pathology. The coronary CTA interpretation by the cardiologist is attached. COMPARISON:  Chest radiograph 11/14/2019.  Chest CT 01/25/2016 FINDINGS: Vascular: Aortic atherosclerosis. Tortuous thoracic aorta. No central pulmonary embolism, on this non-dedicated study. Mediastinum/Nodes: No imaged thoracic adenopathy.  Left  infrahilar nodes are calcified, consistent with old granulomatous disease. Lungs/Pleura: No imaged pleural fluid. Calcified left lower lobe granulomas. Bibasilar scarring or subsegmental atelectasis. Upper Abdomen: Normal imaged portions of the liver, spleen, stomach, right adrenal gland, right kidney. Musculoskeletal: Mild midthoracic superior endplate compression deformity. IMPRESSION: 1.  No acute findings in the imaged extracardiac chest. 2.  Aortic Atherosclerosis (ICD10-I70.0). 3. Mild superior endplate compression deformity, of indeterminate acuity. Sagittal reformats not performed on the prior chest CT Electronically Signed: By: Abigail Miyamoto M.D. On: 11/15/2019 15:48   DG Chest Portable 1 View  Result Date: 11/14/2019 CLINICAL DATA:  Code stroke. EXAM: PORTABLE CHEST 1 VIEW COMPARISON:  Chest radiograph 09/25/2019 FINDINGS: Stable cardiomediastinal contours with normal heart size. Stable calcified left hilar lymph nodes. The lungs are clear. No pneumothorax or large pleural effusion. No acute finding in the visualized skeleton. IMPRESSION: No acute cardiopulmonary process. Electronically Signed   By: Audie Pinto M.D.   On: 11/14/2019 19:53   CT HEAD CODE STROKE WO CONTRAST  Result Date: 11/14/2019 CLINICAL DATA:  Code stroke.  Aphasia.  Last seen normal 1700 hours. EXAM: CT HEAD WITHOUT CONTRAST TECHNIQUE: Contiguous axial images were obtained from the base of the skull through the vertex without intravenous contrast. COMPARISON:  09/25/2019 FINDINGS: Brain: Age related atrophy. Old ischemic changes of the pontomedullary junction region. Old small vessel cerebellar infarctions. Cerebral hemispheres show age related atrophy with extensive chronic small vessel disease of the white matter. Presumed artifactual low-density in the right frontal region. No sign of mass lesion, hemorrhage, hydrocephalus or extra-axial collection. Vascular: There is atherosclerotic calcification of the major vessels at  the base of the brain. Skull: Negative Sinuses/Orbits: Clear/normal Other: Old healed facial fractures on the right. ASPECTS [facility] Stroke Program Early CT Score) - Ganglionic level infarction (caudate, lentiform nuclei, internal capsule, insula, M1-M3 cortex): 7 - Supraganglionic infarction (M4-M6 cortex): 3 Total score (0-10 with 10 being normal): 10 IMPRESSION: 1. No acute finding by CT. Atrophy and chronic small-vessel ischemic changes. 2. ASPECTS is 10 3. These results were communicated to Dr. Cheral Marker at [place] 1/1/2021by text page via the [facility] messaging system. Electronically Signed   By: Nelson Chimes M.D.   On: 11/14/2019 19:16   ECHOCARDIOGRAM LIMITED  Result Date: 11/15/2019   ECHOCARDIOGRAM LIMITED REPORT   Patient Name:   KACEY KABEL Date of Exam: 11/15/2019 Medical Rec #:  [id number]     Height:       70.0 in Accession #:    [id number]    Weight:       133.6 lb Date of Birth:  [birth year]     BSA:          1.76 m Patient Age:    32 years      BP:           99/59 mmHg Patient Gender: M             HR:           74 bpm. Exam Location:  Inpatient  Procedure: Limited Echo, Limited Color Doppler and Cardiac Doppler Indications:    bacteremia 790.7  History:        Patient has prior history of Echocardiogram examinations, most                 recent 09/26/2019. Aortic Valve: A 26 Edwards Edwards Sapien                 bioprosthetic, stented aortic  valve (TAVR)  Sonographer:    Johny Chess Referring PhysLW:[id number] TIMOTHY S OPYD  Sonographer Comments: Technically difficult study due to poor echo windows and no parasternal window. Image acquisition challenging due to respiratory motion. IMPRESSIONS  1. Left ventricular ejection fraction, by visual estimation, is 65 to 70%.  2. Left ventricular diastolic parameters are consistent with Grade I diastolic dysfunction (impaired relaxation).  3. Mild mitral annular calcification.  4. Mild mitral valve regurgitation.  5. Moderate thickening of the mitral  valve leaflet(s).  6. Tricuspid valve regurgitation is mild.  7. Tricuspid valve regurgitation is mild.  8. Bioprosthetic TAVR valve leaflets appear thickened - this is new from the study on 09/26/2019. Transaortic gradients couldn't be appropriately assessed. TEE is recommended. FINDINGS  Left Ventricle: Left ventricular ejection fraction, by visual estimation, is 65 to 70%. Left ventricular diastolic parameters are consistent with Grade I diastolic dysfunction (impaired relaxation). Mitral Valve: There is moderate thickening of the mitral valve leaflet(s). Mild mitral annular calcification. MV Area by PHT, 2.13 cm. MV PHT, 103.24 msec. Mild mitral valve regurgitation. Tricuspid Valve: Tricuspid valve regurgitation is mild. Aortic Valve: 26 Edwards Edwards Sapien bioprosthetic, stented aortic valve (TAVR) valve is present in the aortic position.  LEFT VENTRICLE          Normals PLAX 2D LVIDd:         3.70 cm  3.6 cm LVIDs:         2.10 cm  1.7 cm LV PW:         1.00 cm  1.4 cm LV IVS:        1.00 cm  1.3 cm LVOT diam:     2.25 cm  2.0 cm LV SV:         44 ml    79 ml LV SV Index:   25.42    45 ml/m2 LVOT Area:     3.98 cm 3.14 cm2  LEFT ATRIUM         Index LA diam:    3.20 cm 1.82 cm/m   AORTA                 Normals Ao Root diam: 3.30 cm 31 mm MITRAL VALVE               Normals MV Area (PHT): 2.13 cm             SHUNTS MV PHT:        103.24 msec 55 ms    Systemic Diam: 2.25 cm MV Decel Time: 356 msec    187 ms MV E velocity: 64.70 cm/s 103 cm/s MV A velocity: 76.70 cm/s 70.3 cm/s MV E/A ratio:  0.84       1.5  Ena Dawley MD Electronically signed by Ena Dawley MD Signature Date/Time: 11/15/2019/11:49:01 AM    Final     Subjective: No acute issues or events overnight. Transient episode of dysarthria resolved. Denies headache, chest pain, shortness of breath, nausea, or vomiting.   Discharge Exam: Vitals:   11/15/19 0751 11/15/19 1327  BP: (!) 99/59 (!) 122/94  Pulse: 69 66  Resp: 17 18   Temp: (!) 97.2 F (36.2 C) 97.8 F (36.6 C)  SpO2: 99% 98%   Vitals:   11/15/19 0333 11/15/19 0601 11/15/19 0751 11/15/19 1327  BP: 131/74 123/75 (!) 99/59 (!) 122/94  Pulse: 75 73 69 66  Resp:  14 17 18   Temp: (!) 97.4 F (36.3 C) 97.8 F (36.6 C) (!)  97.2 F (36.2 C) 97.8 F (36.6 C)  TempSrc: Oral Oral Oral Oral  SpO2: 98% 99% 99% 98%  Weight:        General:  Pleasantly resting in bed, No acute distress. HEENT:  Normocephalic atraumatic.  Sclerae nonicteric, noninjected.  Extraocular movements intact bilaterally. Neck:  Without mass or deformity.  Trachea is midline. Lungs:  Clear to auscultate bilaterally without rhonchi, wheeze, or rales. Heart:  Regular rate and rhythm.  Without murmurs, rubs, or gallops. Abdomen:  Soft, nontender, nondistended.  Without guarding or rebound. Extremities: Without cyanosis, clubbing, edema, or obvious deformity. Vascular:  Dorsalis pedis and posterior tibial pulses palpable bilaterally. Skin:  Warm and dry, no erythema, no ulcerations.   The results of significant diagnostics from this hospitalization (including imaging, microbiology, ancillary and laboratory) are listed below for reference.     Microbiology: Recent Results (from the past 240 hour(s))  Culture, blood (routine x 2)     Status: None (Preliminary result)   Collection Time: 11/14/19  7:40 PM   Specimen: BLOOD  Result Value Ref Range Status   Specimen Description BLOOD LEFT ANTECUBITAL  Final   Special Requests   Final    BOTTLES DRAWN AEROBIC AND ANAEROBIC Blood Culture adequate volume   Culture   Final    NO GROWTH < 12 HOURS Performed at [facility], [address] [place], [place] [address]    Report Status PENDING  Incomplete  Culture, blood (routine x 2)     Status: None (Preliminary result)   Collection Time: 11/14/19  8:01 PM   Specimen: BLOOD  Result Value Ref Range Status   Specimen Description BLOOD RIGHT ANTECUBITAL  Final   Special Requests    Final    BOTTLES DRAWN AEROBIC AND ANAEROBIC Blood Culture adequate volume   Culture   Final    NO GROWTH < 12 HOURS Performed at [facility], [place] [address] [place], [place] [address]    Report Status PENDING  Incomplete  SARS CORONAVIRUS 2 (TAT 6-24 HRS) Nasopharyngeal Nasopharyngeal Swab     Status: None   Collection Time: 11/14/19  9:16 PM   Specimen: Nasopharyngeal Swab  Result Value Ref Range Status   SARS Coronavirus 2 NEGATIVE NEGATIVE Final    Comment: (NOTE) SARS-CoV-2 target nucleic acids are NOT DETECTED. The SARS-CoV-2 RNA is generally detectable in upper and lower respiratory specimens during the acute phase of infection. Negative results do not preclude SARS-CoV-2 infection, do not rule out co-infections with other pathogens, and should not be used as the sole basis for treatment or other patient management decisions. Negative results must be combined with clinical observations, patient history, and epidemiological information. The expected result is Negative. Fact Sheet for Patients: SugarRoll.be Fact Sheet for Healthcare Providers: https://www.woods-mathews.com/ This test is not yet approved or cleared by the [place] FDA and  has been authorized for detection and/or diagnosis of SARS-CoV-2 by FDA under an Emergency Use Authorization (EUA). This EUA will remain  in effect (meaning this test can be used) for the duration of the COVID-19 declaration under Section 56 4(b)(1) of the Act, 21 U.S.C. section 360bbb-3(b)(1), unless the authorization is terminated or revoked sooner. Performed at [facility], [place] [address] [place], [place] [address]      Labs: BNP (last 3 results) No results for input(s): BNP in the last 8760 hours. Basic Metabolic Panel: Recent Labs  Lab 11/14/19 1901 11/14/19 1903  NA 136 137  K 4.0  4.0  CL 101 98  CO2 23  --   GLUCOSE 110* 107*  BUN 16 18  CREATININE 0.85 0.80   CALCIUM 8.8*  --    Liver Function Tests: Recent Labs  Lab 11/14/19 1901  AST 17  ALT 9  ALKPHOS 72  BILITOT 0.5  PROT 6.3*  ALBUMIN 3.1*   No results for input(s): LIPASE, AMYLASE in the last 168 hours. Recent Labs  Lab 11/15/19 0002  AMMONIA 15   CBC: Recent Labs  Lab 11/14/19 1901 11/14/19 1903  WBC 4.2  --   NEUTROABS 2.6  --   HGB 11.1* 11.2*  HCT 33.2* 33.0*  MCV 98.8  --   PLT 69*  --    Cardiac Enzymes: No results for input(s): CKTOTAL, CKMB, CKMBINDEX, TROPONINI in the last 168 hours. BNP: Invalid input(s): POCBNP CBG: Recent Labs  Lab 11/14/19 1856  GLUCAP 94   D-Dimer No results for input(s): DDIMER in the last 72 hours. Hgb A1c Recent Labs    11/15/19 0002  HGBA1C 4.8   Lipid Profile Recent Labs    11/15/19 0002  CHOL 166  HDL 46  LDLCALC 108*  TRIG 60  CHOLHDL 3.6   Thyroid function studies No results for input(s): TSH, T4TOTAL, T3FREE, THYROIDAB in the last 72 hours.  Invalid input(s): FREET3 Anemia work up No results for input(s): VITAMINB12, FOLATE, FERRITIN, TIBC, IRON, RETICCTPCT in the last 72 hours. Urinalysis    Component Value Date/Time   COLORURINE YELLOW 11/15/2019 0930   APPEARANCEUR HAZY (A) 11/15/2019 0930   LABSPEC 1.028 11/15/2019 0930   PHURINE 7.0 11/15/2019 0930   GLUCOSEU NEGATIVE 11/15/2019 0930   HGBUR LARGE (A) 11/15/2019 0930   BILIRUBINUR NEGATIVE 11/15/2019 0930   BILIRUBINUR n 10/15/2015 0939   KETONESUR NEGATIVE 11/15/2019 0930   PROTEINUR NEGATIVE 11/15/2019 0930   UROBILINOGEN 0.2 10/15/2015 0939   UROBILINOGEN 0.2 06/23/2015 1443   NITRITE NEGATIVE 11/15/2019 0930   LEUKOCYTESUR NEGATIVE 11/15/2019 0930   Sepsis Labs Invalid input(s): PROCALCITONIN,  WBC,  LACTICIDVEN Microbiology Recent Results (from the past 240 hour(s))  Culture, blood (routine x 2)     Status: None (Preliminary result)   Collection Time: 11/14/19  7:40 PM   Specimen: BLOOD  Result Value Ref Range Status    Specimen Description BLOOD LEFT ANTECUBITAL  Final   Special Requests   Final    BOTTLES DRAWN AEROBIC AND ANAEROBIC Blood Culture adequate volume   Culture   Final    NO GROWTH < 12 HOURS Performed at [facility], [place] [address], [place], [place] [address]    Report Status PENDING  Incomplete  Culture, blood (routine x 2)     Status: None (Preliminary result)   Collection Time: 11/14/19  8:01 PM   Specimen: BLOOD  Result Value Ref Range Status   Specimen Description BLOOD RIGHT ANTECUBITAL  Final   Special Requests   Final    BOTTLES DRAWN AEROBIC AND ANAEROBIC Blood Culture adequate volume   Culture   Final    NO GROWTH < 12 HOURS Performed at [facility], [place] [address] [place], [place] [address]    Report Status PENDING  Incomplete  SARS CORONAVIRUS 2 (TAT 6-24 HRS) Nasopharyngeal Nasopharyngeal Swab     Status: None   Collection Time: 11/14/19  9:16 PM   Specimen: Nasopharyngeal Swab  Result Value Ref Range Status   SARS Coronavirus 2 NEGATIVE NEGATIVE Final    Comment: (NOTE) SARS-CoV-2 target nucleic  acids are NOT DETECTED. The SARS-CoV-2 RNA is generally detectable in upper and lower respiratory specimens during the acute phase of infection. Negative results do not preclude SARS-CoV-2 infection, do not rule out co-infections with other pathogens, and should not be used as the sole basis for treatment or other patient management decisions. Negative results must be combined with clinical observations, patient history, and epidemiological information. The expected result is Negative. Fact Sheet for Patients: SugarRoll.be Fact Sheet for Healthcare Providers: https://www.woods-mathews.com/ This test is not yet approved or cleared by the [place] FDA and  has been authorized for detection and/or diagnosis of SARS-CoV-2 by FDA under an Emergency Use Authorization (EUA). This EUA will remain  in effect (meaning this  test can be used) for the duration of the COVID-19 declaration under Section 56 4(b)(1) of the Act, 21 U.S.C. section 360bbb-3(b)(1), unless the authorization is terminated or revoked sooner. Performed at [facility], [place] [address], [place], [place] [address]      Time coordinating discharge: Over 30 minutes  SIGNED:   Little Ishikawa, DO [facility] 11/15/2019, 5:31 PM Pager   If 7PM-7AM, please contact night-coverage [email or web address] Password [email or web address]

## 2019-11-15 NOTE — Progress Notes (Signed)
SLP Cancellation Note  Patient Details Name: Corey Oneill MRN: [id number] DOB: Jun 19, 1936   Cancelled treatment:       Reason Eval/Treat Not Completed: SLP screened, no needs identified, will sign off   Kionte Baumgardner, Katherene Ponto 11/15/2019, 9:45 AM

## 2019-11-15 NOTE — Progress Notes (Signed)
  Echocardiogram 2D Echocardiogram has been performed.  Corey Oneill 11/15/2019, 9:17 AM

## 2019-11-16 LAB — URINE CULTURE: Culture: NO GROWTH

## 2019-11-17 ENCOUNTER — Encounter: Payer: Self-pay | Admitting: Family Medicine

## 2019-11-17 ENCOUNTER — Other Ambulatory Visit: Payer: Self-pay

## 2019-11-17 ENCOUNTER — Ambulatory Visit (INDEPENDENT_AMBULATORY_CARE_PROVIDER_SITE_OTHER): Payer: Medicare Other | Admitting: Family Medicine

## 2019-11-17 ENCOUNTER — Telehealth: Payer: Self-pay | Admitting: Cardiovascular Disease

## 2019-11-17 ENCOUNTER — Other Ambulatory Visit: Payer: Medicare Other | Admitting: Licensed Clinical Social Worker

## 2019-11-17 VITALS — BP 123/65 | Temp 97.4°F | Wt 139.0 lb

## 2019-11-17 DIAGNOSIS — B952 Enterococcus as the cause of diseases classified elsewhere: Secondary | ICD-10-CM | POA: Diagnosis not present

## 2019-11-17 DIAGNOSIS — D693 Immune thrombocytopenic purpura: Secondary | ICD-10-CM | POA: Diagnosis not present

## 2019-11-17 DIAGNOSIS — C61 Malignant neoplasm of prostate: Secondary | ICD-10-CM

## 2019-11-17 DIAGNOSIS — Z515 Encounter for palliative care: Secondary | ICD-10-CM

## 2019-11-17 DIAGNOSIS — C799 Secondary malignant neoplasm of unspecified site: Secondary | ICD-10-CM

## 2019-11-17 DIAGNOSIS — G459 Transient cerebral ischemic attack, unspecified: Secondary | ICD-10-CM | POA: Diagnosis not present

## 2019-11-17 DIAGNOSIS — R7881 Bacteremia: Secondary | ICD-10-CM | POA: Diagnosis not present

## 2019-11-17 DIAGNOSIS — L120 Bullous pemphigoid: Secondary | ICD-10-CM | POA: Diagnosis not present

## 2019-11-17 NOTE — Progress Notes (Signed)
COMMUNITY PALLIATIVE CARE SW NOTE  PATIENT NAME: Corey Oneill DOB: 12/13/1935 MRN: [id number]  PRIMARY CARE PROVIDER: [name], MD  RESPONSIBLE PARTY:  Acct ID - Guarantor Home Phone Work Phone Relationship Acct Type  [id number] [name]  Self P/F     [place], [place], [place] [address]   Due to the COVID-19 crisis, this virtual check-in visit was done via telephone from my office and it was initiated and consent by this patientand orfamily.  PLAN OF CARE and INTERVENTIONS:             1. GOALS OF CARE/ ADVANCE CARE PLANNING:  Goal is for patient to remain at home.  Patient is a full code. 2. SOCIAL/EMOTIONAL/SPIRITUAL ASSESSMENT/ INTERVENTIONS:  SW conducted a Sales executive visit with patient's daughter, Corey Oneill.  A visit at patient's home is scheduled for Wednesday, 1/6, at [name] reports [name] will meet with them on 1/6 to discuss home care needs.  She is also working with [employer] company regarding his long term care insurance, but stated there is a 90 day waiting period. Corey Oneill stated patient has dementia, is depressed and isolated.  He was previously at [facility] SNF.  Patient's wife stays with him on Thursdays and Fridays.  He has a MOST form from 2016 indicating full code and interventions. 3. PATIENT/CAREGIVER EDUCATION/ COPING:  Provided education regarding the Palliative Care program.  Daughter copes by problem solving. 4. PERSONAL EMERGENCY PLAN:  EMS will be called. 5. COMMUNITY RESOURCES COORDINATION/ HEALTH CARE NAVIGATION:  [employer] to assess. 6. FINANCIAL/LEGAL CONCERNS/INTERVENTIONS:  None identified.     SOCIAL HX:  Social History   Tobacco Use  . Smoking status: Former Smoker    Packs/day: 0.00    Types: Cigarettes  . Smokeless tobacco: Never Used  . Tobacco comment: "quit smoking cigarettes in the 1960s; quit smoking cigars in the 1990s  Substance Use Topics  . Alcohol use: Yes   Comment: 2 DRINKS PER DAY    CODE STATUS:  Full Code ADVANCED DIRECTIVES: N MOST FORM COMPLETE:  Y HOSPICE EDUCATION PROVIDED: N Duration of visit and documentation:  30 minutes.      [name] Ancelmo Hunt, LCSW

## 2019-11-17 NOTE — Telephone Encounter (Signed)
lmtcb to schedule hospital f/u with Dr. Antionette Char APP per staff message from [place]. Pt was discharged 11-16-19 and needs an in person f/u within 3 weeks

## 2019-11-17 NOTE — Progress Notes (Signed)
   Subjective:    Patient ID: Corey Oneill, male    DOB: Aug 05, 1936, 84 y.o.   MRN: [id number]  HPI Documentation for virtual telephone encounter. Documentation for virtual audio and video telecommunications through Corey Oneill encounter: The patient was located at home. The provider was located in the office. The patient did consent to this visit and is aware of possible charges through their insurance for this visit. The other persons participating in this telemedicine service was his dughter Corey Oneill was 5 minutes and in review of previous records >30 minutes total. This virtual service is not related to other E/M service within previous 7 days. He was recently discharged from the hospital and found to have a TIA.  At this point he seems to be doing well and has only minimal voice change.  He plans to see dermatology concerning his pemphigus and apparently was given medications to treat this.  He is scheduled to see Corey Oneill for follow-up on his prostate cancer.  He is also scheduled to see Corey Oneill for follow-up on his bacteremia.  He seems to be doing well on both of those.  Scheduled to see Corey Oneill for follow-up on prostate cancer.  I was contacted by Corey Oneill.  He will need help initially with PT, OT, assessing his ADLs.  Review of Systems     Objective:   Physical Exam Alert and in no distress.  Is voice pattern is essentially normal.  No visual defects noted.      Assessment & Plan:  Bullous pemphigoid  TIA (transient ischemic attack)  Idiopathic thrombocytopenic purpura (ITP) (HCC)  Metastasis from malignant neoplasm of prostate ([place])  Bacteremia due to Enterococcus I had a long discussion with the daughter concerning his overall care.  She will be helping to make sure he is cared for at home.  Apparently the wife is not very helpful in this regard. Corey Oneill will help with the assessment concerning PT, OT and need for nursing care during the day.  Explained  that he probably will not need it at night as his wife will be there.  I will continue to work with the daughter concerning his care.

## 2019-11-18 ENCOUNTER — Inpatient Hospital Stay: Payer: Medicare Other

## 2019-11-18 ENCOUNTER — Other Ambulatory Visit: Payer: Self-pay

## 2019-11-18 ENCOUNTER — Telehealth: Payer: Self-pay

## 2019-11-18 ENCOUNTER — Inpatient Hospital Stay: Payer: Medicare Other | Attending: Oncology | Admitting: Oncology

## 2019-11-18 VITALS — BP 144/76 | HR 74 | Temp 98.0°F | Resp 18 | Ht 70.0 in | Wt 138.8 lb

## 2019-11-18 DIAGNOSIS — Z79899 Other long term (current) drug therapy: Secondary | ICD-10-CM | POA: Insufficient documentation

## 2019-11-18 DIAGNOSIS — L120 Bullous pemphigoid: Secondary | ICD-10-CM | POA: Insufficient documentation

## 2019-11-18 DIAGNOSIS — Z85820 Personal history of malignant melanoma of skin: Secondary | ICD-10-CM | POA: Diagnosis not present

## 2019-11-18 DIAGNOSIS — C61 Malignant neoplasm of prostate: Secondary | ICD-10-CM | POA: Insufficient documentation

## 2019-11-18 DIAGNOSIS — Z8673 Personal history of transient ischemic attack (TIA), and cerebral infarction without residual deficits: Secondary | ICD-10-CM | POA: Diagnosis not present

## 2019-11-18 DIAGNOSIS — C7951 Secondary malignant neoplasm of bone: Secondary | ICD-10-CM | POA: Insufficient documentation

## 2019-11-18 DIAGNOSIS — Z7982 Long term (current) use of aspirin: Secondary | ICD-10-CM | POA: Insufficient documentation

## 2019-11-18 DIAGNOSIS — D693 Immune thrombocytopenic purpura: Secondary | ICD-10-CM | POA: Insufficient documentation

## 2019-11-18 DIAGNOSIS — I4891 Unspecified atrial fibrillation: Secondary | ICD-10-CM

## 2019-11-18 LAB — CBC WITH DIFFERENTIAL (CANCER CENTER ONLY)
Abs Immature Granulocytes: 0.01 10*3/uL (ref 0.00–0.07)
Basophils Absolute: 0 10*3/uL (ref 0.0–0.1)
Basophils Relative: 0 %
Eosinophils Absolute: 0.1 10*3/uL (ref 0.0–0.5)
Eosinophils Relative: 1 %
HCT: 34.5 % — ABNORMAL LOW (ref 39.0–52.0)
Hemoglobin: 11.7 g/dL — ABNORMAL LOW (ref 13.0–17.0)
Immature Granulocytes: 0 %
Lymphocytes Relative: 28 %
Lymphs Abs: 1.2 10*3/uL (ref 0.7–4.0)
MCH: 32.4 pg (ref 26.0–34.0)
MCHC: 33.9 g/dL (ref 30.0–36.0)
MCV: 95.6 fL (ref 80.0–100.0)
Monocytes Absolute: 0.6 10*3/uL (ref 0.1–1.0)
Monocytes Relative: 15 %
Neutro Abs: 2.4 10*3/uL (ref 1.7–7.7)
Neutrophils Relative %: 56 %
Platelet Count: 76 10*3/uL — ABNORMAL LOW (ref 150–400)
RBC: 3.61 MIL/uL — ABNORMAL LOW (ref 4.22–5.81)
RDW: 14.2 % (ref 11.5–15.5)
WBC Count: 4.3 10*3/uL (ref 4.0–10.5)
nRBC: 0 % (ref 0.0–0.2)

## 2019-11-18 LAB — CMP (CANCER CENTER ONLY)
ALT: 6 U/L (ref 0–44)
AST: 14 U/L — ABNORMAL LOW (ref 15–41)
Albumin: 3.4 g/dL — ABNORMAL LOW (ref 3.5–5.0)
Alkaline Phosphatase: 84 U/L (ref 38–126)
Anion gap: 9 (ref 5–15)
BUN: 16 mg/dL (ref 8–23)
CO2: 28 mmol/L (ref 22–32)
Calcium: 8.8 mg/dL — ABNORMAL LOW (ref 8.9–10.3)
Chloride: 102 mmol/L (ref 98–111)
Creatinine: 0.82 mg/dL (ref 0.61–1.24)
GFR, Est AFR Am: >60 mL/min (ref >60)
GFR, Estimated: >60 mL/min (ref >60)
Glucose, Bld: 95 mg/dL (ref 70–99)
Potassium: 4.2 mmol/L (ref 3.5–5.1)
Sodium: 139 mmol/L (ref 135–145)
Total Bilirubin: 0.4 mg/dL (ref 0.3–1.2)
Total Protein: 7.1 g/dL (ref 6.5–8.1)

## 2019-11-18 NOTE — Telephone Encounter (Signed)
Per Dr. Meda Coffee, called patient's daughter to set up event monitor (under Dr. Burt Knack) and arrange visit with Dr. Burt Knack to review results. Will cancel TOC appointment with Scott.  Left message to call back.

## 2019-11-18 NOTE — Progress Notes (Signed)
Hematology and Oncology Follow Up Visit  Corey Oneill [id number] September 13, 1936 84 y.o. 11/18/2019 1:47 PM Corey Oneill, MDLalonde, Corey Jarvis, MD   Principle Diagnosis: 84 year old man with:    1.  Castration-resistant prostate cancer with disease to the bone [facility].    2.  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  He continues to follow with dermatology regarding this issue.  3.  Melanoma of the chest wall diagnosed in 2017 without any evidence of relapse since that time.e.   4.  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  This was diagnosed in 2018.   Prior Therapy:   He is status post wide excision of his melanoma in December 2017.  Prednisone therapy currently at 60 mg daily started in February 2018 and was tapered to off in May 2018.  Current therapy:   Lupron every 6 months under the care of Dr. Junious Silk.  Xtandi 160 mg daily started by Dr. Junious Silk April 2020.  Interim History:  [name] is here for return evaluation.  Since the last visit, he was hospitalized briefly for TIA and discharged on November 15, 2019.  He had previous hospitalization back in November 2020 because of Enterococcus bacteremia.  He is recovering slowly at this time with increased mobility using a walker.  He had not had any recent falls or syncope.  He had no bleeding complications.            Medications: Without any changes on review. Current Outpatient Medications  Medication Sig Dispense Refill  . acetaminophen (TYLENOL) 500 MG tablet Take 500 mg by mouth every 6 (six) hours as needed for moderate pain.     [name] amoxicillin (AMOXIL) 500 MG tablet Take 1 tablet (500 mg total) by mouth 2 (two) times daily. 60 tablet 11  . aspirin EC 81 MG tablet Take 81 mg by mouth at bedtime.     . calcium carbonate (OS-CAL - DOSED IN MG OF ELEMENTAL CALCIUM) 1250 (500 Ca) MG tablet Take 1 tablet by mouth daily with breakfast.    . carvedilol  (COREG) 3.125 MG tablet TAKE 1 TABLET BY MOUTH TWICE DAILY. (Patient taking differently: Take 3.125 mg by mouth 2 (two) times daily. ) 60 tablet 2  . DOXYCYCLINE PO Take by mouth.    . DULoxetine (CYMBALTA) 30 MG capsule Take 30 mg by mouth daily.    . enzalutamide (XTANDI) 40 MG capsule Take 160 mg by mouth daily with supper.     . ferrous sulfate 325 (65 FE) MG tablet Take 325 mg by mouth daily with breakfast.    . folic acid (FOLVITE) 1 MG tablet Take 1 mg by mouth See admin instructions. Everyday but Tues    . isosorbide mononitrate (IMDUR) 30 MG 24 hr tablet Take 1 tablet (30 mg total) by mouth daily. 30 tablet 11  . methotrexate (RHEUMATREX) 2.5 MG tablet Take 10 mg by mouth every Tuesday.     . MULTIPLE VITAMIN PO Take 1 tablet by mouth daily.    . nitroGLYCERIN (NITROSTAT) 0.4 MG SL tablet 1 TAB UNDER TONGUE AS NEEDED FOR CHEST PAIN. MAY REPEAT EVERY 5 MIN FOR A TOTAL OF 3 DOSES. (Patient taking differently: Place 0.4 mg under the tongue every 5 (five) minutes as needed for chest pain. ) 25 tablet 2  . rosuvastatin (CRESTOR) 10 MG tablet TAKE 1 TABLET ONCE DAILY. (Patient taking differently: Take 10 mg by mouth daily. ) 90 tablet 0  No current facility-administered medications for this visit.     Allergies:  Allergies  Allergen Reactions  . Prednisone Other (See Comments)    Makes depressed    Past Medical History, Surgical history, Social history, and Family History updated without any changes.  Physical Exam:  Blood pressure (!) 144/76, pulse 74, temperature 98 F (36.7 C), temperature source Temporal, resp. rate 18, height 5\' 10"  (1.778 m), weight 138 lb 12.8 oz (63 kg), SpO2 100 %.      ECOG: 1    General appearance: Alert, awake without any distress. Head: Atraumatic without abnormalities Oropharynx: Without any thrush or ulcers. Eyes: No scleral icterus. Lymph nodes: No lymphadenopathy noted in the cervical, supraclavicular, or axillary nodes Heart:regular  rate and rhythm, without any murmurs or gallops.   Oneill: Clear to auscultation without any rhonchi, wheezes or dullness to percussion. Abdomin: Soft, nontender without any shifting dullness or ascites. Musculoskeletal: No clubbing or cyanosis. Neurological: No motor or sensory deficits. Skin: No rashes or lesions. .       Lab Results: Lab Results  Component Value Date   WBC 4.2 11/14/2019   HGB 11.2 (L) 11/14/2019   HCT 33.0 (L) 11/14/2019   MCV 98.8 11/14/2019   PLT 69 (L) 11/14/2019     Chemistry      Component Value Date/Time   NA 137 11/14/2019 1903   NA 142 07/14/2019 0953   NA 141 09/21/2017 1101   K 4.0 11/14/2019 1903   K 4.4 09/21/2017 1101   CL 98 11/14/2019 1903   CO2 23 11/14/2019 1901   CO2 29 09/21/2017 1101   BUN 18 11/14/2019 1903   BUN 19 07/14/2019 0953   BUN 18.7 09/21/2017 1101   CREATININE 0.80 11/14/2019 1903   CREATININE 0.80 08/14/2019 0938   CREATININE 0.9 09/21/2017 1101      Component Value Date/Time   CALCIUM 8.8 (L) 11/14/2019 1901   CALCIUM 8.7 09/21/2017 1101   ALKPHOS 72 11/14/2019 1901   ALKPHOS 79 09/21/2017 1101   AST 17 11/14/2019 1901   AST 17 08/14/2019 0938   AST 22 09/21/2017 1101   ALT 9 11/14/2019 1901   ALT 8 08/14/2019 0938   ALT 16 09/21/2017 1101   BILITOT 0.5 11/14/2019 1901   BILITOT 0.6 08/14/2019 0938   BILITOT 0.70 09/21/2017 1101       Impression and Plan:  84 year old man with:       1.  Advanced prostate cancer with disease to the bone.  He is currently castration-resistant disease and currently on Xtandi.  He continues to follow with Dr. Junious Silk regarding this issue but understands might require additional therapy.  His disease status was updated and the role for systemic chemotherapy was discussed.  For the time being we will continue active surveillance.   2.  Bullous pemphigoid: Follows at [facility] dermatology.  Currently on methotrexate.  Appears to be improving at this time.   3.   Thrombocytopenia: Related to ITP without any active treatment needed at this time.  Blood count is adequate at this time.  4.  Melanoma diagnosed in 2017.  He is currently on active surveillance at this time without any evidence of relapsed disease.   5. Follow-up: He will return in 3 months for a follow-up.   25  minutes was spent on reviewing his disease status, reviewing laboratory data, disease status update as well as addressing future treatment options.   Zola Button, MD 1/5/20211:47 PM

## 2019-11-19 ENCOUNTER — Other Ambulatory Visit: Payer: Medicare Other | Admitting: Licensed Clinical Social Worker

## 2019-11-19 ENCOUNTER — Telehealth: Payer: Self-pay | Admitting: Radiology

## 2019-11-19 ENCOUNTER — Other Ambulatory Visit: Payer: Medicare Other | Admitting: *Deleted

## 2019-11-19 ENCOUNTER — Telehealth: Payer: Self-pay | Admitting: Oncology

## 2019-11-19 DIAGNOSIS — Z515 Encounter for palliative care: Secondary | ICD-10-CM | POA: Diagnosis not present

## 2019-11-19 LAB — CULTURE, BLOOD (ROUTINE X 2)
Culture: NO GROWTH
Culture: NO GROWTH
Special Requests: ADEQUATE
Special Requests: ADEQUATE

## 2019-11-19 LAB — PROSTATE-SPECIFIC AG, SERUM (LABCORP): Prostate Specific Ag, Serum: 2.8 ng/mL (ref 0.0–4.0)

## 2019-11-19 NOTE — Telephone Encounter (Signed)
Follow Up  Patient's daughter [name]) returning call. Please give patient's daughter a call back.

## 2019-11-19 NOTE — Telephone Encounter (Signed)
Briefly reviewed event monitor instructions with patient's daughter (DPR). Confirmed address and insurance.  She understands OV with Richardson Dopp is cancelled. Rescheduled with Dr. Burt Knack 3/12. She was grateful for assistance.

## 2019-11-19 NOTE — Telephone Encounter (Signed)
Scheduled appt per 1/5 los, sent a message to HIM pool to get a calendar mailed out. 

## 2019-11-19 NOTE — Telephone Encounter (Signed)
Enrolled patient for a 30 day [employer] Event monitor to be mailed to patients home.  

## 2019-11-20 ENCOUNTER — Other Ambulatory Visit: Payer: Medicare Other | Admitting: Internal Medicine

## 2019-11-20 DIAGNOSIS — Z515 Encounter for palliative care: Secondary | ICD-10-CM

## 2019-11-20 NOTE — Progress Notes (Signed)
COMMUNITY PALLIATIVE CARE SW NOTE  PATIENT NAME: Corey Oneill DOB: 1936-11-03 MRN: [id number]  PRIMARY CARE PROVIDER: [name], MD  RESPONSIBLE PARTY:  Acct ID - Guarantor Home Phone Work Phone Relationship Acct Type  [id number] [name]* [phone]  Self P/F     [address], [place], [place] [address]     PLAN OF CARE and INTERVENTIONS:             1. GOALS OF CARE/ ADVANCE CARE PLANNING:  Goal is for patient to remain at home with his wife.  He has a DNR and MOST form. 2. SOCIAL/EMOTIONAL/SPIRITUAL ASSESSMENT/ INTERVENTIONS:  SW and Palliative Care RN, Corey Oneill, met with patient and his daughter, Corey Oneill.  Patient lives with his wife who currently works Monday through Thursday.  Corey Oneill and her sister live in the area and help as much as possible.  They both have families also.  Patient said he had three children.  He appeared confused at times, but was able to participate in life review.  Provided a DNR and MOST form.  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 3. PATIENT/CAREGIVER EDUCATION/ COPING:  Provided education regarding the Palliative Care program.  Corey Oneill copes by problem-solving. 4. PERSONAL EMERGENCY PLAN:  Family will call EMS. 5. COMMUNITY RESOURCES COORDINATION/ HEALTH CARE NAVIGATION:  [employer] to begin next week to provide assistance. 6. FINANCIAL/LEGAL CONCERNS/INTERVENTIONS:  None.     SOCIAL HX:  Social History   Tobacco Use  . Smoking status: Former Smoker    Packs/day: 0.00    Types: Cigarettes  . Smokeless tobacco: Never Used  . Tobacco comment: "quit smoking cigarettes in the 1960s; quit smoking cigars in the 1990s  Substance Use Topics  . Alcohol use: Yes    Comment: 2 DRINKS PER DAY    CODE STATUS:  DNR  ADVANCED DIRECTIVES:  LW, HCPOA MOST FORM COMPLETE:  Pending NP review and signature. HOSPICE EDUCATION PROVIDED: N PPS:  Patient's appetite has decreased.  He ambulates with a walker. Duration  of visit and documentation:  60 minutes.      [name] Corey Belloso, LCSW

## 2019-11-20 NOTE — Progress Notes (Signed)
    Reviewed details of MOST form with Patient's daughter Corey Oneill.  Verified wish for DNR/DNI. Limited Scope of Medical Interventions. Yes to IVFs and Antibiotics. Feeding tube for a defined trial period.  Form completed; present in [facility] EMR.    Violeta Gelinas NP-C [place] [id number]

## 2019-11-20 NOTE — Progress Notes (Signed)
COMMUNITY PALLIATIVE CARE RN NOTE  PATIENT NAME: Corey Oneill DOB: 28-Nov-1935 MRN: [id number]  PRIMARY CARE PROVIDER: [name], MD  RESPONSIBLE PARTY: Irving Burton (daughter) Acct ID - Guarantor Home Phone Work Phone Relationship Acct Type  [id number] [name]  Self P/F     [place], [place], [place] [address]   Covid-19 Pre-screening Negative  PLAN OF CARE and INTERVENTION:  1. ADVANCE CARE PLANNING/GOALS OF CARE: Goal is for patient to remain at home with his wife. DNR form requested and given. 2. PATIENT/CAREGIVER EDUCATION: Explained Palliative Care Services 3. DISEASE STATUS: Joint visit made with Palliative Care SW, Lynn Duffy. Met with patient and his daughter, [name]. Reviewed goals of care. MOST form completed. Limited Interventions, IV fluids if indicated, antibiotics on a case by case bases and feeding tube for a trial period. Upon arrival, patient answered the door and is observed ambulating using a walker. He denies pain. He is pleasant and engaging, but is forgetful and easily confused. He participated in life review. No dyspnea noted or reported. He says that he fatigues easily. He requires assistance with bathing and dressing. He is able to toilet and feed himself. He has a fair appetite. He does drink Ensure for additional nutritional supplementation. He denies dysphagia. He takes his medication whole with water. He has a hired caregiver through [employer] who will starting on Friday. Patient does have long term care insurance who will begin to pay for caregivers soon. Daughter is currently working on getting medical POA for patient. He is currently receiving home health services for PT/OT. He also has an Therapist, sports visiting from [place] daughter states for at least another week. Daughter says that they are sending a monitor for patient to wear for 30 days. He will be seeing his Cardiologist on 01/23/20. He has an appointment with his infectious  disease MD on Monday 11/25/19. He has a Podiatrist coming to his home on Friday 11/21/19. Patient is agreeable to future Palliative care visits. Will continue to monitor.    HISTORY OF PRESENT ILLNESS:  This is a 84 yo male who resides at home with his wife. Palliative care team asked to follow patient for advanced care planning and additional support. He was recently seen in the ED on 11/14/19 for aphasia, but was not a candidate for TPa. He has had a multiple ED visits over the past several months for delirium, sepsis and urinary retention. Next visit scheduled in 2 weeks.   CODE STATUS: DNR  ADVANCED DIRECTIVES: N MOST FORM: yes (awaiting NP signature in Vynca) PPS: 50%   (Duration of visit and documentation 75 minutes)   [name], RN BSN

## 2019-11-21 ENCOUNTER — Other Ambulatory Visit: Payer: Self-pay

## 2019-11-21 DIAGNOSIS — M2041 Other hammer toe(s) (acquired), right foot: Secondary | ICD-10-CM | POA: Diagnosis not present

## 2019-11-21 DIAGNOSIS — M79675 Pain in left toe(s): Secondary | ICD-10-CM | POA: Diagnosis not present

## 2019-11-21 DIAGNOSIS — I739 Peripheral vascular disease, unspecified: Secondary | ICD-10-CM | POA: Diagnosis not present

## 2019-11-21 DIAGNOSIS — L97521 Non-pressure chronic ulcer of other part of left foot limited to breakdown of skin: Secondary | ICD-10-CM | POA: Diagnosis not present

## 2019-11-21 DIAGNOSIS — L97511 Non-pressure chronic ulcer of other part of right foot limited to breakdown of skin: Secondary | ICD-10-CM | POA: Diagnosis not present

## 2019-11-21 DIAGNOSIS — B351 Tinea unguium: Secondary | ICD-10-CM | POA: Diagnosis not present

## 2019-11-24 ENCOUNTER — Encounter: Payer: Self-pay | Admitting: Internal Medicine

## 2019-11-24 ENCOUNTER — Ambulatory Visit (INDEPENDENT_AMBULATORY_CARE_PROVIDER_SITE_OTHER): Payer: Medicare Other | Admitting: Internal Medicine

## 2019-11-24 ENCOUNTER — Other Ambulatory Visit: Payer: Self-pay

## 2019-11-24 VITALS — BP 109/56 | HR 97 | Temp 97.6°F | Resp 12 | Ht 70.0 in | Wt 139.0 lb

## 2019-11-24 DIAGNOSIS — R7881 Bacteremia: Secondary | ICD-10-CM

## 2019-11-24 DIAGNOSIS — B952 Enterococcus as the cause of diseases classified elsewhere: Secondary | ICD-10-CM | POA: Diagnosis not present

## 2019-11-24 DIAGNOSIS — Z952 Presence of prosthetic heart valve: Secondary | ICD-10-CM | POA: Diagnosis not present

## 2019-11-24 NOTE — Progress Notes (Signed)
   Subjective:    Patient ID: Corey Oneill, male    DOB: 1936/08/13, 84 y.o.   MRN: [id number]  HPI Here for follow up of Enterococcal bacteremia  He has a history of a prosthetic aortic valve and TTE without vegetation but unable to do a TEE.  After completion of his initial treatment, the bacteremia returned and he was treated for 6 weeks with dual beta lactam therapy and converted now to oral suppressive treatment with amoxicillin 500 mg twice a day.  Tolerating well with no associated diarrhea or rash.  No complaints.   Review of Systems  Constitutional: Negative for chills and fever.  Gastrointestinal: Negative for diarrhea and nausea.  Skin: Negative for rash.       Objective:   Physical Exam Constitutional:      Appearance: Normal appearance.  Eyes:     General: No scleral icterus. Cardiovascular:     Rate and Rhythm: Normal rate and regular rhythm.     Heart sounds: No murmur.  Pulmonary:     Effort: Pulmonary effort is normal.  Neurological:     Mental Status: He is alert.  Psychiatric:        Mood and Affect: Mood normal.           Assessment & Plan:

## 2019-11-24 NOTE — Assessment & Plan Note (Signed)
No vegetation noted but no TEE done.  May be something on the valve so will continue with suppressive antibiotics.

## 2019-11-24 NOTE — Assessment & Plan Note (Signed)
Bacteremia is resolved but with re-occurrence, I will have him remain on suppressive amoxicillin life-long.   He can rtc in 6 months

## 2019-11-25 ENCOUNTER — Emergency Department (HOSPITAL_COMMUNITY): Payer: Medicare Other

## 2019-11-25 ENCOUNTER — Inpatient Hospital Stay (HOSPITAL_COMMUNITY)
Admission: EM | Admit: 2019-11-25 | Discharge: 2019-11-28 | DRG: 101 | Disposition: A | Discharge status: Home Health | Payer: Medicare Other | Attending: Internal Medicine | Admitting: Internal Medicine

## 2019-11-25 ENCOUNTER — Encounter (HOSPITAL_COMMUNITY): Payer: Self-pay | Admitting: Internal Medicine

## 2019-11-25 DIAGNOSIS — I10 Essential (primary) hypertension: Secondary | ICD-10-CM | POA: Diagnosis present

## 2019-11-25 DIAGNOSIS — Z66 Do not resuscitate: Secondary | ICD-10-CM | POA: Diagnosis not present

## 2019-11-25 DIAGNOSIS — I6523 Occlusion and stenosis of bilateral carotid arteries: Secondary | ICD-10-CM | POA: Diagnosis not present

## 2019-11-25 DIAGNOSIS — R0902 Hypoxemia: Secondary | ICD-10-CM | POA: Diagnosis not present

## 2019-11-25 DIAGNOSIS — E785 Hyperlipidemia, unspecified: Secondary | ICD-10-CM | POA: Diagnosis present

## 2019-11-25 DIAGNOSIS — G40209 Localization-related (focal) (partial) symptomatic epilepsy and epileptic syndromes with complex partial seizures, not intractable, without status epilepticus: Principal | ICD-10-CM | POA: Diagnosis present

## 2019-11-25 DIAGNOSIS — Z515 Encounter for palliative care: Secondary | ICD-10-CM | POA: Diagnosis present

## 2019-11-25 DIAGNOSIS — Z7982 Long term (current) use of aspirin: Secondary | ICD-10-CM

## 2019-11-25 DIAGNOSIS — R2990 Unspecified symptoms and signs involving the nervous system: Secondary | ICD-10-CM

## 2019-11-25 DIAGNOSIS — Z87891 Personal history of nicotine dependence: Secondary | ICD-10-CM

## 2019-11-25 DIAGNOSIS — L120 Bullous pemphigoid: Secondary | ICD-10-CM | POA: Diagnosis present

## 2019-11-25 DIAGNOSIS — G9340 Encephalopathy, unspecified: Secondary | ICD-10-CM | POA: Diagnosis present

## 2019-11-25 DIAGNOSIS — R531 Weakness: Secondary | ICD-10-CM | POA: Diagnosis not present

## 2019-11-25 DIAGNOSIS — D693 Immune thrombocytopenic purpura: Secondary | ICD-10-CM | POA: Diagnosis not present

## 2019-11-25 DIAGNOSIS — R29818 Other symptoms and signs involving the nervous system: Secondary | ICD-10-CM | POA: Diagnosis not present

## 2019-11-25 DIAGNOSIS — C61 Malignant neoplasm of prostate: Secondary | ICD-10-CM | POA: Diagnosis present

## 2019-11-25 DIAGNOSIS — R4781 Slurred speech: Secondary | ICD-10-CM | POA: Diagnosis not present

## 2019-11-25 DIAGNOSIS — Z974 Presence of external hearing-aid: Secondary | ICD-10-CM

## 2019-11-25 DIAGNOSIS — I639 Cerebral infarction, unspecified: Secondary | ICD-10-CM | POA: Diagnosis not present

## 2019-11-25 DIAGNOSIS — G459 Transient cerebral ischemic attack, unspecified: Secondary | ICD-10-CM | POA: Diagnosis not present

## 2019-11-25 DIAGNOSIS — Z20822 Contact with and (suspected) exposure to covid-19: Secondary | ICD-10-CM | POA: Diagnosis not present

## 2019-11-25 DIAGNOSIS — Z8546 Personal history of malignant neoplasm of prostate: Secondary | ICD-10-CM

## 2019-11-25 DIAGNOSIS — B952 Enterococcus as the cause of diseases classified elsewhere: Secondary | ICD-10-CM | POA: Diagnosis present

## 2019-11-25 DIAGNOSIS — Z808 Family history of malignant neoplasm of other organs or systems: Secondary | ICD-10-CM

## 2019-11-25 DIAGNOSIS — Z8673 Personal history of transient ischemic attack (TIA), and cerebral infarction without residual deficits: Secondary | ICD-10-CM

## 2019-11-25 DIAGNOSIS — Z807 Family history of other malignant neoplasms of lymphoid, hematopoietic and related tissues: Secondary | ICD-10-CM

## 2019-11-25 DIAGNOSIS — Z9079 Acquired absence of other genital organ(s): Secondary | ICD-10-CM

## 2019-11-25 DIAGNOSIS — R4701 Aphasia: Secondary | ICD-10-CM | POA: Diagnosis not present

## 2019-11-25 DIAGNOSIS — Z923 Personal history of irradiation: Secondary | ICD-10-CM

## 2019-11-25 DIAGNOSIS — Z86006 Personal history of melanoma in-situ: Secondary | ICD-10-CM

## 2019-11-25 DIAGNOSIS — Z86718 Personal history of other venous thrombosis and embolism: Secondary | ICD-10-CM

## 2019-11-25 DIAGNOSIS — R569 Unspecified convulsions: Secondary | ICD-10-CM

## 2019-11-25 DIAGNOSIS — I2510 Atherosclerotic heart disease of native coronary artery without angina pectoris: Secondary | ICD-10-CM | POA: Diagnosis present

## 2019-11-25 DIAGNOSIS — Z87442 Personal history of urinary calculi: Secondary | ICD-10-CM

## 2019-11-25 DIAGNOSIS — R29810 Facial weakness: Secondary | ICD-10-CM | POA: Diagnosis present

## 2019-11-25 DIAGNOSIS — Z79899 Other long term (current) drug therapy: Secondary | ICD-10-CM

## 2019-11-25 DIAGNOSIS — Z862 Personal history of diseases of the blood and blood-forming organs and certain disorders involving the immune mechanism: Secondary | ICD-10-CM

## 2019-11-25 DIAGNOSIS — H9190 Unspecified hearing loss, unspecified ear: Secondary | ICD-10-CM | POA: Diagnosis present

## 2019-11-25 DIAGNOSIS — Z952 Presence of prosthetic heart valve: Secondary | ICD-10-CM

## 2019-11-25 LAB — RESPIRATORY PANEL BY RT PCR (FLU A&B, COVID)
Influenza A by PCR: NEGATIVE
Influenza B by PCR: NEGATIVE
SARS Coronavirus 2 by RT PCR: NEGATIVE

## 2019-11-25 LAB — COMPREHENSIVE METABOLIC PANEL
ALT: 9 U/L (ref 0–44)
AST: 21 U/L (ref 15–41)
Albumin: 3.1 g/dL — ABNORMAL LOW (ref 3.5–5.0)
Alkaline Phosphatase: 71 U/L (ref 38–126)
Anion gap: 10 (ref 5–15)
BUN: 18 mg/dL (ref 8–23)
CO2: 24 mmol/L (ref 22–32)
Calcium: 8.7 mg/dL — ABNORMAL LOW (ref 8.9–10.3)
Chloride: 100 mmol/L (ref 98–111)
Creatinine, Ser: 0.84 mg/dL (ref 0.61–1.24)
GFR calc Af Amer: >60 mL/min (ref >60)
GFR calc non Af Amer: >60 mL/min (ref >60)
Glucose, Bld: 100 mg/dL — ABNORMAL HIGH (ref 70–99)
Potassium: 3.9 mmol/L (ref 3.5–5.1)
Sodium: 134 mmol/L — ABNORMAL LOW (ref 135–145)
Total Bilirubin: 0.3 mg/dL (ref 0.3–1.2)
Total Protein: 6.4 g/dL — ABNORMAL LOW (ref 6.5–8.1)

## 2019-11-25 LAB — CBC
HCT: 32.3 % — ABNORMAL LOW (ref 39.0–52.0)
Hemoglobin: 10.8 g/dL — ABNORMAL LOW (ref 13.0–17.0)
MCH: 33.1 pg (ref 26.0–34.0)
MCHC: 33.4 g/dL (ref 30.0–36.0)
MCV: 99.1 fL (ref 80.0–100.0)
Platelets: 60 10*3/uL — ABNORMAL LOW (ref 150–400)
RBC: 3.26 MIL/uL — ABNORMAL LOW (ref 4.22–5.81)
RDW: 14.6 % (ref 11.5–15.5)
WBC: 4.4 10*3/uL (ref 4.0–10.5)
nRBC: 0 % (ref 0.0–0.2)

## 2019-11-25 LAB — I-STAT CHEM 8, ED
BUN: 20 mg/dL (ref 8–23)
Calcium, Ion: 0.94 mmol/L — ABNORMAL LOW (ref 1.15–1.40)
Chloride: 100 mmol/L (ref 98–111)
Creatinine, Ser: 0.7 mg/dL (ref 0.61–1.24)
Glucose, Bld: 99 mg/dL (ref 70–99)
HCT: 31 % — ABNORMAL LOW (ref 39.0–52.0)
Hemoglobin: 10.5 g/dL — ABNORMAL LOW (ref 13.0–17.0)
Potassium: 4 mmol/L (ref 3.5–5.1)
Sodium: 134 mmol/L — ABNORMAL LOW (ref 135–145)
TCO2: 27 mmol/L (ref 22–32)

## 2019-11-25 LAB — DIFFERENTIAL
Abs Immature Granulocytes: 0.02 10*3/uL (ref 0.00–0.07)
Basophils Absolute: 0 10*3/uL (ref 0.0–0.1)
Basophils Relative: 1 %
Eosinophils Absolute: 0.1 10*3/uL (ref 0.0–0.5)
Eosinophils Relative: 1 %
Immature Granulocytes: 1 %
Lymphocytes Relative: 24 %
Lymphs Abs: 1.1 10*3/uL (ref 0.7–4.0)
Monocytes Absolute: 0.6 10*3/uL (ref 0.1–1.0)
Monocytes Relative: 14 %
Neutro Abs: 2.6 10*3/uL (ref 1.7–7.7)
Neutrophils Relative %: 59 %

## 2019-11-25 LAB — CBG MONITORING, ED: Glucose-Capillary: 94 mg/dL (ref 70–99)

## 2019-11-25 LAB — PROTIME-INR
INR: 1.2 (ref 0.8–1.2)
Prothrombin Time: 14.6 seconds (ref 11.4–15.2)

## 2019-11-25 LAB — APTT: aPTT: 26 seconds (ref 24–36)

## 2019-11-25 MED ORDER — ACETAMINOPHEN 160 MG/5ML PO SOLN: 650.0000 mg | ORAL | Status: DC | PRN

## 2019-11-25 MED ORDER — STROKE: EARLY STAGES OF RECOVERY BOOK
Freq: Once | Status: AC
  Filled 2019-11-25: qty 1

## 2019-11-25 MED ORDER — IOHEXOL 350 MG/ML SOLN
100.0000 mL | Freq: Once | INTRAVENOUS | Status: AC | PRN
  Administered 2019-11-25: 100 mL via INTRAVENOUS

## 2019-11-25 MED ORDER — ACETAMINOPHEN 650 MG RE SUPP: 650.0000 mg | RECTAL | Status: DC | PRN

## 2019-11-25 MED ORDER — SENNOSIDES-DOCUSATE SODIUM 8.6-50 MG PO TABS: 1.0000 | ORAL_TABLET | Freq: Every evening | ORAL | Status: DC | PRN

## 2019-11-25 MED ORDER — SODIUM CHLORIDE 0.9 % IV SOLN: INTRAVENOUS | Status: DC

## 2019-11-25 MED ORDER — LORAZEPAM 2 MG/ML IJ SOLN: 1.0000 mg | INTRAMUSCULAR | Status: DC | PRN

## 2019-11-25 MED ORDER — FOLIC ACID 1 MG PO TABS
1.0000 mg | ORAL_TABLET | ORAL | Status: DC
  Administered 2019-11-26 – 2019-11-28 (×3): 1 mg via ORAL
  Filled 2019-11-25 (×3): qty 1

## 2019-11-25 MED ORDER — DULOXETINE HCL 30 MG PO CPEP
30.0000 mg | ORAL_CAPSULE | Freq: Every day | ORAL | Status: DC
  Administered 2019-11-26 – 2019-11-28 (×3): 30 mg via ORAL
  Filled 2019-11-25 (×3): qty 1

## 2019-11-25 MED ORDER — ISOSORBIDE MONONITRATE ER 30 MG PO TB24
30.0000 mg | ORAL_TABLET | Freq: Every day | ORAL | Status: DC
  Administered 2019-11-26 – 2019-11-28 (×3): 30 mg via ORAL
  Filled 2019-11-25 (×3): qty 1

## 2019-11-25 MED ORDER — ACETAMINOPHEN 325 MG PO TABS: 650.0000 mg | ORAL_TABLET | ORAL | Status: DC | PRN

## 2019-11-25 MED ORDER — DOXYCYCLINE HYCLATE 100 MG PO TABS
100.0000 mg | ORAL_TABLET | Freq: Two times a day (BID) | ORAL | Status: DC
  Administered 2019-11-26 – 2019-11-28 (×5): 100 mg via ORAL
  Filled 2019-11-25 (×5): qty 1

## 2019-11-25 MED ORDER — ASPIRIN EC 81 MG PO TBEC
81.0000 mg | DELAYED_RELEASE_TABLET | Freq: Every evening | ORAL | Status: DC
  Administered 2019-11-26 – 2019-11-28 (×3): 81 mg via ORAL
  Filled 2019-11-25 (×3): qty 1

## 2019-11-25 MED ORDER — SODIUM CHLORIDE 0.9% FLUSH: 3.0000 mL | Freq: Once | INTRAVENOUS | Status: DC

## 2019-11-25 MED ORDER — AMOXICILLIN 500 MG PO CAPS
500.0000 mg | ORAL_CAPSULE | Freq: Two times a day (BID) | ORAL | Status: DC
  Administered 2019-11-26 – 2019-11-28 (×5): 500 mg via ORAL
  Filled 2019-11-25 (×5): qty 1

## 2019-11-25 MED ORDER — ROSUVASTATIN CALCIUM 5 MG PO TABS
10.0000 mg | ORAL_TABLET | Freq: Every day | ORAL | Status: DC
  Administered 2019-11-26 – 2019-11-28 (×3): 10 mg via ORAL
  Filled 2019-11-25 (×3): qty 2

## 2019-11-25 MED ORDER — CARVEDILOL 3.125 MG PO TABS
3.1250 mg | ORAL_TABLET | Freq: Two times a day (BID) | ORAL | Status: DC
  Administered 2019-11-26 – 2019-11-28 (×5): 3.125 mg via ORAL
  Filled 2019-11-25 (×6): qty 1

## 2019-11-25 MED ORDER — ENZALUTAMIDE 40 MG PO CAPS: 160.0000 mg | ORAL_CAPSULE | Freq: Every day | ORAL | Status: DC

## 2019-11-25 NOTE — ED Triage Notes (Signed)
To ED via [facility] from home- with stroke symptoms -- LSN - 1320-- EMS arrived at 1340-- pt had right sided weakness, aphasia- , on ride here became able to move right side some, as well as speech was clear.  On arrival to ED - pt aphasic, unable to lift right arm, IV 18g left AC

## 2019-11-25 NOTE — Procedures (Signed)
Patient Name: Corey Oneill  MRN: [id number]  Epilepsy Attending: Lora Havens  Referring Physician/Provider: Dr Kerney Elbe Date: 11/25/2019  Duration: 23.41 mins  Patient history: 84 y.o. male with h/o "staring spells" presenting after acute onset of aphasia, right facial droop and right sided weakness at home. EEG to evaluate for seizure.   Level of alertness: awake  AEDs during EEG study: None  Technical aspects: This EEG study was done with scalp electrodes positioned according to the 10-20 International system of electrode placement. Electrical activity was acquired at a sampling rate of 500Hz  and reviewed with a high frequency filter of 70Hz  and a low frequency filter of 1Hz . EEG data were recorded continuously and digitally stored.   DESCRIPTION: No clear posterior dominant rhythm was seen. EEG showed continuous generalized 3-5hz  theta-delta slowing as well as intermittent rhythmic generalized 2-3Hz  delta slowing.  Hyperventilation and photic stimulation were not performed.  ABNORMALITY - Continuous slow, generalized - Intermittent rhythmic slow, generalized   IMPRESSION: This study is suggestive of moderate diffuse encephalopathy, non specific to etiology.  No seizures or epileptiform discharges were seen throughout the recording.  Dawnn Nam Barbra Sarks

## 2019-11-25 NOTE — Code Documentation (Addendum)
Stroke Response Nurse Documentation Code Documentation  Corey Oneill is a 84 y.o. male arriving to [place] ED via [facility] on 1/12 with past medical hx of TIA. Code stroke was activated by EMS. Patient from home where he was LKW at 1320 when he had a sudden onset of right sided weakness and trouble speaking . Stroke team at the bedside on patient arrival. Labs drawn and patient cleared for CT by EDP. Patient to CT with team. NIHSS 9, see documentation for details and code stroke times. Patient with disoriented, not following commands, right facial droop, bilateral leg weakness and Receptive aphasia  on exam. The following imaging was completed:CT, CTA head and neck, CTP. Patient is not a candidate for tPA due to thrombocytopenia - Resulted at 1444 with Platelets of 63. Upon completion of CTA, pt was noted to have improved symptoms. No longer having trouble speaking or following commands. NIHSS 2 due to bilateral leg drifts. Placed in Trauma A. Placed on the cardiac monitor. Denies pain or memory of the event. Pt to continue q2 hour mNIHSS. Bedside handoff with ED RN Santiago Glad.    Kathrin Greathouse  Stroke Response RN   '

## 2019-11-25 NOTE — ED Provider Notes (Signed)
[facility] EMERGENCY DEPARTMENT Provider Note   CSN: [id number] Arrival date & time: 11/25/19  1410  An emergency department physician performed an initial assessment on this suspected stroke patient at 1410.  History Chief Complaint  Patient presents with  . Code Stroke    Corey Oneill is a 84 y.o. male.  Patient with last known normal at 130.  Right-sided weakness, aphasia, right-sided facial droop prior to arrival.  [place] directly to the CT scan with neurology.  However, symptoms appear to be improving.  The history is provided by the patient.  Neurologic Problem This is a new problem. The current episode started less than 1 hour ago. The problem occurs constantly. The problem has not changed since onset.Pertinent negatives include no chest pain, no abdominal pain, no headaches and no shortness of breath. Nothing aggravates the symptoms. Nothing relieves the symptoms. He has tried nothing for the symptoms. The treatment provided no relief.       Past Medical History:  Diagnosis Date  . Abdominal aortic atherosclerosis ([place])   . Anginal pain ([place])    last noted 08/17/19  . Bone cancer ([place])    Left hip  . BPPV (benign paroxysmal positional vertigo)   . Bullous pemphigoid   . CAD (coronary artery disease)    a.  LHC 8/16: Mid to distal LAD 30%, OM1 40%, proximal mid RCA 40%, distal RCA 60% >> FFR 0.69  >> PCI: 3 x 15 mm Resolute DES  . Depression   . Esophageal reflux   . Grade I diastolic dysfunction 123456   Noted on ECHO   . H/O blood clots    "get them in my stool and urine" (11/12/2015)  . Heart murmur   . Hepatic lesion 12/08/2015   Stable 8 mm right hepatic lesion  . History of aortic stenosis    a. peak to peak gradient by LHC 8/16:  37 mmHg (moderately severe)   . History of appendicitis 2016  . History of blood transfusion 12/2018  . History of herpes labialis   . History of ITP    2018, thrombocytopenia  . History of kidney stones    . Hx of radiation therapy 04/14/13-06/09/13   prostate 7800 cGy, 40 sessions, seminal vesicles 5600 cGy 40 sessions  . Hydrocele 2006   Small  . Hyperlipidemia   . Insomnia    resolved  . LVH (left ventricular hypertrophy)    Moderate, noted on ECHO 09/2018  . 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 Metastasis from malignant neoplasm of prostate ([place]) 02/25/2019  . Prostate cancer ([place]) dx'd 2014  . Radiation proctitis   . Renal mass 2017   Bilateral renal masses  . S/P skin biopsy 04/09/2019   subepidermal cell poor vesicle , Linear IGG and C3 the basement membrane  . Suprapubic catheter ([place])   . Wears hearing aid in both ears     Patient Active Problem List   Diagnosis Date Noted  . Pressure injury of skin 11/15/2019  . TIA (transient ischemic attack) 11/15/2019  . Word finding difficulty 11/14/2019  . Altered mental state 09/25/2019  . Idiopathic thrombocytopenic purpura (ITP) ([facility]) 09/25/2019  . Macrocytic anemia 09/25/2019  . Bacteremia due to Enterococcus 09/11/2019  . Bullous pemphigoid 06/12/2019  . Metastasis from malignant neoplasm of prostate ([place]) 02/25/2019  . Radiation proctitis   . Hx of radiation therapy   . Heart murmur   . H/O blood clots   .  Family history of adverse reaction to anesthesia   . CAD (coronary artery disease)   . Aortic stenosis   . Anginal pain ([place])   . History of ITP 02/26/2017  . 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 S/P appendectomy 11/12/2015  . Esophageal reflux 03/22/2015  . Depression 11/20/2014  . Prostate cancer ([place]) 03/05/2013  . Erectile dysfunction   . Hyperlipidemia 06/07/2007    Past Surgical History:  Procedure Laterality Date  . CARDIAC CATHETERIZATION N/A 04/21/2015   Procedure: Right/Left Heart Cath and Coronary Angiography;  Surgeon: [name], MD;   Location: [facility];  Service: Cardiovascular;  Laterality: N/A;  . CARDIAC CATHETERIZATION N/A 06/18/2015   Procedure: Intravascular Pressure Wire/FFR Study;  Surgeon: [name], MD;  Location: [facility];  Service: Cardiovascular;  Laterality: N/A;  . CARDIAC CATHETERIZATION N/A 06/18/2015   Procedure: Coronary Stent Intervention;  Surgeon: [name], MD;  Location: [facility];  Service: Cardiovascular;  Laterality: N/A;  . CARDIAC CATHETERIZATION N/A 06/18/2015   Procedure: Right/Left Heart Cath and Coronary Angiography;  Surgeon: [name], MD;  Location: [facility];  Service: Cardiovascular;  Laterality: N/A;  . CARDIAC CATHETERIZATION N/A 06/23/2015   Procedure: Left Heart Cath and Cors/Grafts Angiography;  Surgeon: [name], MD;  Location: [facility];  Service: Cardiovascular;  Laterality: N/A;  . CARDIAC CATHETERIZATION N/A 01/17/2016   Procedure: Right/Left Heart Cath and Coronary Angiography;  Surgeon: [name], MD;  Location: [facility];  Service: Cardiovascular;  Laterality: N/A;  . CATARACT EXTRACTION, BILATERAL    . COLONOSCOPY  06/26/2017  . CYSTOSCOPY WITH BIOPSY N/A 07/30/2018   Procedure: CYSTOSCOPY WITH BIOPSY/FULGURATION, CYSTOLTHOLAPAXY;  Surgeon: [name], MD;  Location: [facility];  Service: Urology;  Laterality: N/A;  . CYSTOSCOPY WITH URETHRAL DILATATION N/A 08/18/2019   Procedure: CYSTOSCOPY WITH URETHRAL DILATATION USING BALLOON OR LASER/ SPURO PUBIC TUBE CHANGE LASER EXCISION OF URETHRAL STRICTURE RETROGRADE URETHROGRAM ANTEGRADE CYSTOGRAM;  Surgeon: [name], MD;  Location: [facility];  Service: Urology;  Laterality: N/A;  . FRACTURE SURGERY    . INGUINAL HERNIA REPAIR     patient does not remember this procedure  . LAPAROSCOPIC APPENDECTOMY N/A 11/16/2015   Procedure: APPENDECTOMY LAPAROSCOPIC;  Surgeon: [name], MD;  Location: [facility];  Service: General;  Laterality: N/A;    . LEFT HEART CATH AND CORONARY ANGIOGRAPHY N/A 12/26/2016   Procedure: Left Heart Cath and Coronary Angiography;  Surgeon: Troy Sine, MD;  Location: [facility];  Service: Cardiovascular;  Laterality: N/A;  . MELANOMA EXCISION Right 10/24/2016   Procedure: WIDE EXCISION MELANOMA RIGHT NECK AND RIGHT CHEST TIMES 2;  Surgeon: [name], MD;  Location: [facility];  Service: General;  Laterality: Right;  right medial and lateral lesion of chest and right neck  . NASAL FRACTURE SURGERY     "broken years ago; several ORs to correct it"  . NASAL SEPTUM SURGERY    . PROSTATE BIOPSY  2014   "needle biopsy"  . TEE WITHOUT CARDIOVERSION N/A 02/15/2016   Procedure: TRANSESOPHAGEAL ECHOCARDIOGRAM (TEE);  Surgeon: [name], MD;  Location: [facility];  Service: Open Heart Surgery;  Laterality: N/A;  . TEE WITHOUT CARDIOVERSION N/A 08/26/2019   Procedure: TRANSESOPHAGEAL ECHOCARDIOGRAM (TEE);  Surgeon: Pixie Casino, MD;  Location: [facility];  Service:  Cardiovascular;  Laterality: N/A;  . TONSILLECTOMY    . TRANSCATHETER AORTIC VALVE REPLACEMENT, TRANSFEMORAL  02/15/2016  . TRANSCATHETER AORTIC VALVE REPLACEMENT, TRANSFEMORAL N/A 02/15/2016   Procedure: TRANSCATHETER AORTIC VALVE REPLACEMENT, TRANSFEMORAL;  Surgeon: [name], MD;  Location: [facility];  Service: Open Heart Surgery;  Laterality: N/A;  . TRANSURETHRAL RESECTION OF PROSTATE  12/23/2018   Procedure: CYSTOSCOPY WITH  BLADDER BIOPSY WITH FULGERATION/ TRANSURETHRAL RESECTION PROSTATE  AND PROSTATE BIOPSY;  Surgeon: [name], MD;  Location: [facility];  Service: Urology;;       Family History  Problem Relation Age of Onset  . Brain cancer Father   . Lymphoma Mother   . Depression Daughter     Social History   Tobacco Use  . Smoking status: Former Smoker    Packs/day: 0.00    Types: Cigarettes  . Smokeless tobacco: Never Used  . Tobacco comment: "quit smoking cigarettes in the 1960s; quit smoking  cigars in the 1990s  Substance Use Topics  . Alcohol use: Yes    Comment: 2 DRINKS PER DAY  . Drug use: No    Home Medications Prior to Admission medications   Medication Sig Start Date End Date Taking? Authorizing Provider  acetaminophen (TYLENOL) 500 MG tablet Take 500 mg by mouth every 6 (six) hours as needed for moderate pain.     [provider]  amoxicillin (AMOXIL) 500 MG tablet Take 1 tablet (500 mg total) by mouth 2 (two) times daily. 11/07/19   Thayer Headings, MD  aspirin EC 81 MG tablet Take 81 mg by mouth at bedtime.     [provider]  calcium carbonate (OS-CAL - DOSED IN MG OF ELEMENTAL CALCIUM) 1250 (500 Ca) MG tablet Take 1 tablet by mouth daily with breakfast.    [provider]  carvedilol (COREG) 3.125 MG tablet TAKE 1 TABLET BY MOUTH TWICE DAILY. Patient taking differently: Take 3.125 mg by mouth 2 (two) times daily.  06/26/19   [name], MD  DOXYCYCLINE PO Take by mouth.    [provider]  DULoxetine (CYMBALTA) 30 MG capsule Take 30 mg by mouth daily.    [provider]  enzalutamide Gillermina Phy) 40 MG capsule Take 160 mg by mouth daily with supper.     [provider]  ferrous sulfate 325 (65 FE) MG tablet Take 325 mg by mouth daily with breakfast.    [provider]  folic acid (FOLVITE) 1 MG tablet Take 1 mg by mouth See admin instructions. Everyday but Tues    [provider]  isosorbide mononitrate (IMDUR) 30 MG 24 hr tablet Take 1 tablet (30 mg total) by mouth daily. 10/22/18 11/15/19  Richardson Dopp T, PA-C  methotrexate (RHEUMATREX) 2.5 MG tablet Take 10 mg by mouth every Tuesday.     [provider]  MULTIPLE VITAMIN PO Take 1 tablet by mouth daily.    [provider]  nitroGLYCERIN (NITROSTAT) 0.4 MG SL tablet 1 TAB UNDER TONGUE AS NEEDED FOR CHEST PAIN. MAY REPEAT EVERY 5 MIN FOR A TOTAL OF 3 DOSES. Patient taking differently: Place 0.4 mg under the tongue every 5  (five) minutes as needed for chest pain.  07/07/19   [name], MD  rosuvastatin (CRESTOR) 10 MG tablet TAKE 1 TABLET ONCE DAILY. Patient taking differently: Take 10 mg by mouth daily.  05/26/19   Tysinger, Camelia Eng, PA-C    Allergies    Prednisone  Review of Systems   Review of Systems  Constitutional: Negative for chills and fever.  HENT: Negative for ear pain and sore throat.   Eyes: Negative for pain and visual disturbance.  Respiratory: Negative for cough and shortness of breath.   Cardiovascular: Negative for chest pain and palpitations.  Gastrointestinal: Negative for abdominal pain and vomiting.  Genitourinary: Negative for dysuria and hematuria.  Musculoskeletal: Negative for arthralgias and back pain.  Skin: Negative for color change and rash.  Neurological: Positive for speech difficulty and weakness. Negative for dizziness, tremors, seizures, syncope, facial asymmetry, light-headedness, numbness and headaches.  All other systems reviewed and are negative.   Physical Exam Updated Vital Signs  ED Triage Vitals [11/25/19 1430]  Enc Vitals Group     BP 128/79     Pulse Rate 74     Resp 14     Temp (!) 97.5 F (36.4 C)     Temp Source Temporal     SpO2      Weight      Height      Head Circumference      Peak Flow      Pain Score      Pain Loc      Pain Edu?      Excl. in [place]?     Physical Exam Vitals and nursing note reviewed.  Constitutional:      General: He is not in acute distress.    Appearance: He is well-developed. He is not ill-appearing.  HENT:     Head: Normocephalic and atraumatic.     Nose: Nose normal.     Mouth/Throat:     Mouth: Mucous membranes are moist.  Eyes:     Extraocular Movements: Extraocular movements intact.     Conjunctiva/sclera: Conjunctivae normal.     Pupils: Pupils are equal, round, and reactive to light.  Cardiovascular:     Rate and Rhythm: Normal rate and regular rhythm.     Heart sounds: No murmur.  Pulmonary:      Effort: Pulmonary effort is normal. No respiratory distress.     Breath sounds: Normal breath sounds.  Abdominal:     Palpations: Abdomen is soft.     Tenderness: There is no abdominal tenderness.  Musculoskeletal:        General: Normal range of motion.     Cervical back: Normal range of motion and neck supple.  Skin:    General: Skin is warm and dry.     Capillary Refill: Capillary refill takes less than 2 seconds.  Neurological:     General: No focal deficit present.     Mental Status: He is alert and oriented to person, place, and time.     Cranial Nerves: No cranial nerve deficit.     Sensory: No sensory deficit.     Motor: No weakness.     Coordination: Coordination normal.     Comments: 5+ out of 5 strength throughout, normal sensation, normal speech, normal sensation  Psychiatric:        Mood and Affect: Mood normal.     ED Results / Procedures / Treatments   Labs (all labs ordered are listed, but only abnormal results are displayed) Labs Reviewed  CBC - Abnormal; Notable for the following components:      Result Value   RBC 3.26 (*)    Hemoglobin 10.8 (*)    HCT 32.3 (*)    Platelets 60 (*)    All other components within normal limits  COMPREHENSIVE METABOLIC PANEL - Abnormal; Notable  for the following components:   Sodium 134 (*)    Glucose, Bld 100 (*)    Calcium 8.7 (*)    Total Protein 6.4 (*)    Albumin 3.1 (*)    All other components within normal limits  I-STAT CHEM 8, ED - Abnormal; Notable for the following components:   Sodium 134 (*)    Calcium, Ion 0.94 (*)    Hemoglobin 10.5 (*)    HCT 31.0 (*)    All other components within normal limits  RESPIRATORY PANEL BY RT PCR (FLU A&B, COVID)  PROTIME-INR  APTT  DIFFERENTIAL  CBG MONITORING, ED    EKG EKG Interpretation  Date/Time:  Tuesday November 25 2019 14:49:03 EST Ventricular Rate:  72 PR Interval:    QRS Duration: 106 QT Interval:  441 QTC Calculation: 483 R Axis:   91 Text  Interpretation: Sinus rhythm Right atrial enlargement Right axis deviation Borderline prolonged QT interval Confirmed by Lennice Sites [phone]) on 11/25/2019 3:59:37 PM   Radiology CT Code Stroke CTA Head W/WO contrast  Result Date: 11/25/2019 CLINICAL DATA:  Neuro deficit, acute, stroke suspected. EXAM: CT ANGIOGRAPHY HEAD AND NECK CT PERFUSION BRAIN TECHNIQUE: Multidetector CT imaging of the head and neck was performed using the standard protocol during bolus administration of intravenous contrast. Multiplanar CT image reconstructions and MIPs were obtained to evaluate the vascular anatomy. Carotid stenosis measurements (when applicable) are obtained utilizing NASCET criteria, using the distal internal carotid diameter as the denominator. Multiphase CT imaging of the brain was performed following IV bolus contrast injection. Subsequent parametric perfusion maps were calculated using RAPID software. CONTRAST:  16mL OMNIPAQUE IOHEXOL 350 MG/ML SOLN COMPARISON:  Noncontrast head CT performed earlier the same day, CT angiogram head/neck 11/14/2019 FINDINGS: CTA NECK FINDINGS Aortic arch: Standard aortic branching. Scattered soft and calcified plaque within the visualized aortic arch and proximal major branch vessels of the neck. Right carotid system: CCA widely patent to the bifurcation. Mild atherosclerotic plaque within the carotid bifurcation and proximal ICA. No measurable stenosis of the proximal ICA relative to the more distal vessel. Left carotid system: CCA patent to the bifurcation. Soft and calcified plaque within the carotid bifurcation and proximal ICA. No measurable stenosis of the proximal ICA as compared to the more distal vessel. Vertebral arteries: The left vertebral artery is significantly dominant. The non dominant right vertebral artery is patent throughout the neck without significant stenosis (50% or greater). Mixed plaque results in unchanged moderate/severe ostial stenosis at the origin  of the dominant left vertebral artery. Distal to this, the left vertebral artery is patent within the neck without significant stenosis (50% or greater). Skeleton: No acute bony abnormality. Cervical spondylosis with multilevel shallow posterior disc osteophytes, uncovertebral and facet hypertrophy. No high-grade bony spinal canal stenosis. Other neck: No neck mass or cervical lymphadenopathy. Subcentimeter right thyroid lobe nodule, not meeting consensus criteria for ultrasound follow-up. Upper chest: Ill-defined opacity within the posterior aspect of the partially visualized right upper lobe suspicious for pneumonia. Review of the MIP images confirms the above findings CTA HEAD FINDINGS Anterior circulation: The intracranial internal carotid arteries are patent bilaterally with scattered calcified plaque but no more than mild stenosis. The right middle and anterior cerebral arteries are patent without proximal branch occlusion or high-grade proximal stenosis. The left middle cerebral artery is patent without proximal branch occlusion or high-grade proximal arterial stenosis identified. The left anterior cerebral artery is patent without significant proximal stenosis. Redemonstrated multifocal high-grade stenoses within distal left anterior cerebral  artery branches (for instance as seen on series 6, image 58) (series 11, image 23). No intracranial aneurysm is identified. Posterior circulation: The non dominant intracranial right vertebral artery is patent and appears to terminate as the right PICA. The dominant intracranial left vertebral artery is patent without significant stenosis, as is the basilar artery. The bilateral posterior cerebral arteries are patent without significant proximal stenosis. Posterior communicating arteries are poorly delineated and may be hypoplastic or absent bilaterally. Venous sinuses: Within limitations of contrast timing, no convincing thrombus. Anatomic variants: As described Review  of the MIP images confirms the above findings CT Brain Perfusion Findings: CBF (<30%) Volume: None Perfusion (Tmax>6.0s) volume: None Mismatch Volume: None Infarction Location: None These results were communicated to Dr. Cheral Marker At 3:33 pmon 1/12/2021by text page via the [facility] messaging system. IMPRESSION: CTA neck: 1. The bilateral common and internal carotid arteries are patent within the neck without significant stenosis. Mild plaque at the carotid bifurcations and within the proximal ICAs. 2. Significantly dominant left vertebral artery with redemonstrated moderate/severe ostial stenosis. The vertebral arteries are otherwise patent within the neck without significant stenosis. 3. Ill-defined airspace opacity within the posterior aspect of the partially imaged right upper lobe suspicious for pneumonia. CTA head: 1. No intracranial large vessel occlusion or proximal high-grade arterial stenosis identified. 2. Redemonstrated multifocal high-grade stenoses within distal left anterior cerebral artery branches. 3. Calcified atherosclerosis within the intracranial ICAs, but no more than mild stenosis. CT perfusion head: 1. The perfusion software detects no core infarct. 2. The perfusion software detects no region of critically hypoperfused parenchyma utilizing a Tmax>6 seconds threshold. Electronically Signed   By: Kellie Simmering DO   On: 11/25/2019 15:31   CT Code Stroke CTA Neck W/WO contrast  Result Date: 11/25/2019 CLINICAL DATA:  Neuro deficit, acute, stroke suspected. EXAM: CT ANGIOGRAPHY HEAD AND NECK CT PERFUSION BRAIN TECHNIQUE: Multidetector CT imaging of the head and neck was performed using the standard protocol during bolus administration of intravenous contrast. Multiplanar CT image reconstructions and MIPs were obtained to evaluate the vascular anatomy. Carotid stenosis measurements (when applicable) are obtained utilizing NASCET criteria, using the distal internal carotid diameter as the denominator.  Multiphase CT imaging of the brain was performed following IV bolus contrast injection. Subsequent parametric perfusion maps were calculated using RAPID software. CONTRAST:  143mL OMNIPAQUE IOHEXOL 350 MG/ML SOLN COMPARISON:  Noncontrast head CT performed earlier the same day, CT angiogram head/neck 11/14/2019 FINDINGS: CTA NECK FINDINGS Aortic arch: Standard aortic branching. Scattered soft and calcified plaque within the visualized aortic arch and proximal major branch vessels of the neck. Right carotid system: CCA widely patent to the bifurcation. Mild atherosclerotic plaque within the carotid bifurcation and proximal ICA. No measurable stenosis of the proximal ICA relative to the more distal vessel. Left carotid system: CCA patent to the bifurcation. Soft and calcified plaque within the carotid bifurcation and proximal ICA. No measurable stenosis of the proximal ICA as compared to the more distal vessel. Vertebral arteries: The left vertebral artery is significantly dominant. The non dominant right vertebral artery is patent throughout the neck without significant stenosis (50% or greater). Mixed plaque results in unchanged moderate/severe ostial stenosis at the origin of the dominant left vertebral artery. Distal to this, the left vertebral artery is patent within the neck without significant stenosis (50% or greater). Skeleton: No acute bony abnormality. Cervical spondylosis with multilevel shallow posterior disc osteophytes, uncovertebral and facet hypertrophy. No high-grade bony spinal canal stenosis. Other neck: No neck mass or  cervical lymphadenopathy. Subcentimeter right thyroid lobe nodule, not meeting consensus criteria for ultrasound follow-up. Upper chest: Ill-defined opacity within the posterior aspect of the partially visualized right upper lobe suspicious for pneumonia. Review of the MIP images confirms the above findings CTA HEAD FINDINGS Anterior circulation: The intracranial internal carotid  arteries are patent bilaterally with scattered calcified plaque but no more than mild stenosis. The right middle and anterior cerebral arteries are patent without proximal branch occlusion or high-grade proximal stenosis. The left middle cerebral artery is patent without proximal branch occlusion or high-grade proximal arterial stenosis identified. The left anterior cerebral artery is patent without significant proximal stenosis. Redemonstrated multifocal high-grade stenoses within distal left anterior cerebral artery branches (for instance as seen on series 6, image 58) (series 11, image 23). No intracranial aneurysm is identified. Posterior circulation: The non dominant intracranial right vertebral artery is patent and appears to terminate as the right PICA. The dominant intracranial left vertebral artery is patent without significant stenosis, as is the basilar artery. The bilateral posterior cerebral arteries are patent without significant proximal stenosis. Posterior communicating arteries are poorly delineated and may be hypoplastic or absent bilaterally. Venous sinuses: Within limitations of contrast timing, no convincing thrombus. Anatomic variants: As described Review of the MIP images confirms the above findings CT Brain Perfusion Findings: CBF (<30%) Volume: None Perfusion (Tmax>6.0s) volume: None Mismatch Volume: None Infarction Location: None These results were communicated to Dr. Cheral Marker At 3:33 pmon 1/12/2021by text page via the [facility] messaging system. IMPRESSION: CTA neck: 1. The bilateral common and internal carotid arteries are patent within the neck without significant stenosis. Mild plaque at the carotid bifurcations and within the proximal ICAs. 2. Significantly dominant left vertebral artery with redemonstrated moderate/severe ostial stenosis. The vertebral arteries are otherwise patent within the neck without significant stenosis. 3. Ill-defined airspace opacity within the posterior aspect of  the partially imaged right upper lobe suspicious for pneumonia. CTA head: 1. No intracranial large vessel occlusion or proximal high-grade arterial stenosis identified. 2. Redemonstrated multifocal high-grade stenoses within distal left anterior cerebral artery branches. 3. Calcified atherosclerosis within the intracranial ICAs, but no more than mild stenosis. CT perfusion head: 1. The perfusion software detects no core infarct. 2. The perfusion software detects no region of critically hypoperfused parenchyma utilizing a Tmax>6 seconds threshold. Electronically Signed   By: Kellie Simmering DO   On: 11/25/2019 15:31   CT Code Stroke Cerebral Perfusion with contrast  Result Date: 11/25/2019 CLINICAL DATA:  Neuro deficit, acute, stroke suspected. EXAM: CT ANGIOGRAPHY HEAD AND NECK CT PERFUSION BRAIN TECHNIQUE: Multidetector CT imaging of the head and neck was performed using the standard protocol during bolus administration of intravenous contrast. Multiplanar CT image reconstructions and MIPs were obtained to evaluate the vascular anatomy. Carotid stenosis measurements (when applicable) are obtained utilizing NASCET criteria, using the distal internal carotid diameter as the denominator. Multiphase CT imaging of the brain was performed following IV bolus contrast injection. Subsequent parametric perfusion maps were calculated using RAPID software. CONTRAST:  129mL OMNIPAQUE IOHEXOL 350 MG/ML SOLN COMPARISON:  Noncontrast head CT performed earlier the same day, CT angiogram head/neck 11/14/2019 FINDINGS: CTA NECK FINDINGS Aortic arch: Standard aortic branching. Scattered soft and calcified plaque within the visualized aortic arch and proximal major branch vessels of the neck. Right carotid system: CCA widely patent to the bifurcation. Mild atherosclerotic plaque within the carotid bifurcation and proximal ICA. No measurable stenosis of the proximal ICA relative to the more distal vessel. Left carotid system: CCA  patent to the bifurcation. Soft and calcified plaque within the carotid bifurcation and proximal ICA. No measurable stenosis of the proximal ICA as compared to the more distal vessel. Vertebral arteries: The left vertebral artery is significantly dominant. The non dominant right vertebral artery is patent throughout the neck without significant stenosis (50% or greater). Mixed plaque results in unchanged moderate/severe ostial stenosis at the origin of the dominant left vertebral artery. Distal to this, the left vertebral artery is patent within the neck without significant stenosis (50% or greater). Skeleton: No acute bony abnormality. Cervical spondylosis with multilevel shallow posterior disc osteophytes, uncovertebral and facet hypertrophy. No high-grade bony spinal canal stenosis. Other neck: No neck mass or cervical lymphadenopathy. Subcentimeter right thyroid lobe nodule, not meeting consensus criteria for ultrasound follow-up. Upper chest: Ill-defined opacity within the posterior aspect of the partially visualized right upper lobe suspicious for pneumonia. Review of the MIP images confirms the above findings CTA HEAD FINDINGS Anterior circulation: The intracranial internal carotid arteries are patent bilaterally with scattered calcified plaque but no more than mild stenosis. The right middle and anterior cerebral arteries are patent without proximal branch occlusion or high-grade proximal stenosis. The left middle cerebral artery is patent without proximal branch occlusion or high-grade proximal arterial stenosis identified. The left anterior cerebral artery is patent without significant proximal stenosis. Redemonstrated multifocal high-grade stenoses within distal left anterior cerebral artery branches (for instance as seen on series 6, image 58) (series 11, image 23). No intracranial aneurysm is identified. Posterior circulation: The non dominant intracranial right vertebral artery is patent and appears to  terminate as the right PICA. The dominant intracranial left vertebral artery is patent without significant stenosis, as is the basilar artery. The bilateral posterior cerebral arteries are patent without significant proximal stenosis. Posterior communicating arteries are poorly delineated and may be hypoplastic or absent bilaterally. Venous sinuses: Within limitations of contrast timing, no convincing thrombus. Anatomic variants: As described Review of the MIP images confirms the above findings CT Brain Perfusion Findings: CBF (<30%) Volume: None Perfusion (Tmax>6.0s) volume: None Mismatch Volume: None Infarction Location: None These results were communicated to Dr. Cheral Marker At 3:33 pmon 1/12/2021by text page via the [facility] messaging system. IMPRESSION: CTA neck: 1. The bilateral common and internal carotid arteries are patent within the neck without significant stenosis. Mild plaque at the carotid bifurcations and within the proximal ICAs. 2. Significantly dominant left vertebral artery with redemonstrated moderate/severe ostial stenosis. The vertebral arteries are otherwise patent within the neck without significant stenosis. 3. Ill-defined airspace opacity within the posterior aspect of the partially imaged right upper lobe suspicious for pneumonia. CTA head: 1. No intracranial large vessel occlusion or proximal high-grade arterial stenosis identified. 2. Redemonstrated multifocal high-grade stenoses within distal left anterior cerebral artery branches. 3. Calcified atherosclerosis within the intracranial ICAs, but no more than mild stenosis. CT perfusion head: 1. The perfusion software detects no core infarct. 2. The perfusion software detects no region of critically hypoperfused parenchyma utilizing a Tmax>6 seconds threshold. Electronically Signed   By: Kellie Simmering DO   On: 11/25/2019 15:31   CT HEAD CODE STROKE WO CONTRAST  Result Date: 11/25/2019 CLINICAL DATA:  Code stroke.  Aphasia.  Right-sided  weakness. EXAM: CT HEAD WITHOUT CONTRAST TECHNIQUE: Contiguous axial images were obtained from the base of the skull through the vertex without intravenous contrast. COMPARISON:  MRI 11/14/2019 FINDINGS: Brain: Generalized atrophy. Chronic small-vessel ischemic changes throughout the brain as seen previously. No CT evidence of acute infarction, mass lesion, hemorrhage,  hydrocephalus or extra-axial collection. Vascular: There is atherosclerotic calcification of the major vessels at the base of the brain. Skull: Normal Sinuses/Orbits: Clear/normal Other: None ASPECTS ([place] Stroke Program Early CT Score) - Ganglionic level infarction (caudate, lentiform nuclei, internal capsule, insula, M1-M3 cortex): 7 - Supraganglionic infarction (M4-M6 cortex): 3 Total score (0-10 with 10 being normal): 10 IMPRESSION: 1. No acute finding by CT. Atrophy and chronic small-vessel ischemic changes as seen previously. 2. ASPECTS is 10 3. These results were communicated to Dr. Cheral Marker at 2:29 pmon 1/12/2021by text page via the [facility] messaging system. Electronically Signed   By: Nelson Chimes M.D.   On: 11/25/2019 14:30    Procedures Procedures (including critical care time)  Medications Ordered in ED Medications  sodium chloride flush (NS) 0.9 % injection 3 mL (has no administration in time range)  iohexol (OMNIPAQUE) 350 MG/ML injection 100 mL (100 mLs Intravenous Contrast Given 11/25/19 1505)    ED Course  I have reviewed the triage vital signs and the nursing notes.  Pertinent labs & imaging results that were available during my care of the patient were reviewed by me and considered in my medical decision making (see chart for details).    MDM Rules/Calculators/A&P  Corey Oneill is an 84 year old male with history of ITP, prostate cancer, high cholesterol who presents to the ED with strokelike symptoms.  Patient with normal vitals.  No fever.  Symptoms started just prior to arrival.  Upon my evaluation after  patient has had head CT and evaluation with neurology he appears to be back at baseline.  Does not have any acute neurological findings.  He had an unremarkable head CT, unremarkable CTA of head and neck.  Due to thrombocytopenia patient is not a TPA candidate.  However, his symptoms appeared to be rapidly improved.  Of note he did have recent work-up for the same and was admitted for the similar symptoms about 2 weeks ago.  Had negative MRI and stroke work-up at that time.  Neurology now concern possibly for seizures and recommends admission for EEG.  Lab work showed no significant anemia, lateral abnormality, kidney injury.  EKG shows sinus rhythm.  To be admitted for further care.  This chart was dictated using voice recognition software.  Despite best efforts to proofread,  errors can occur which can change the documentation meaning.    Final Clinical Impression(s) / ED Diagnoses Final diagnoses:  Aphasia  Right sided weakness  Stroke-like symptom    Rx / DC Orders ED Discharge Orders    None       Lennice Sites, DO 11/25/19 1626

## 2019-11-25 NOTE — Consult Note (Signed)
Referring Physician: Dr. Ronnald Nian    Chief Complaint: Acute onset of right sided weakness, right facial droop and aphasia  HPI: Corey Oneill is an 84 y.o. male presenting via EMS after acute onset of aphasia, right facial droop and right sided weakness at home. LKN was the same as TOSO: 1:20 PM. Vitals per EMS were SBP in 120's, HR 72 and 96% RA. CBG 126.   PMHx includes abdominal aortic atherosclerosis, bone cancer, BPPV, CAD, grade I diastolic dysfunction, history of blood clots in urine and stool, history of ITP, history of radiation rx to prostate and HLD.    Takes ASA at home. Not on a blood thinner.   Due to his ITP, recent platelet count on 11/18/19 was low at 76,000, which is below the safe threshold for tPA of 100,000.   INR 1.2 PT 14.6 Platelets 63   LSN: 1:20 PM tPA Given: No: Symptoms resolved with NIHSS 0  Past Medical History:  Diagnosis Date  . Abdominal aortic atherosclerosis ([place])   . Anginal pain ([place])    last noted 08/17/19  . Bone cancer ([place])    Left hip  . BPPV (benign paroxysmal positional vertigo)   . Bullous pemphigoid   . CAD (coronary artery disease)    a.  LHC 8/16: Mid to distal LAD 30%, OM1 40%, proximal mid RCA 40%, distal RCA 60% >> FFR 0.69  >> PCI: 3 x 15 mm Resolute DES  . Depression   . Esophageal reflux   . Family history of adverse reaction to anesthesia    "daughter has PONV"  . Grade I diastolic dysfunction [id number]   Noted on ECHO   . Grade I diastolic dysfunction [id number]   Noted on ECHO  . H/O blood clots    "get them in my stool and urine" (11/12/2015)  . Heart murmur   . Hepatic lesion 12/08/2015   Stable 8 mm right hepatic lesion  . History of aortic stenosis    a. peak to peak gradient by LHC 8/16:  37 mmHg (moderately severe)   . History of appendicitis 2016  . History of blood transfusion 12/2018  . History of herpes labialis   . History of ITP    2018, thrombocytopenia  . History of kidney stones   . Hx of radiation  therapy 04/14/13-06/09/13   prostate 7800 cGy, 40 sessions, seminal vesicles 5600 cGy 40 sessions  . Hydrocele 2006   Small  . Hyperlipidemia   . Insomnia    resolved  . LVH (left ventricular hypertrophy)    Moderate, noted on ECHO 09/2018  . Malignant melanoma in situ ([place]) 09/04/2016   Right neck and chest  . Melanoma ([place])    pt denies  . Metastasis from malignant neoplasm of prostate ([place]) 02/25/2019  . Prostate cancer ([place]) dx'd 2014  . Radiation proctitis   . Renal mass 2017   Bilateral renal masses  . S/P skin biopsy 04/09/2019   subepidermal cell poor vesicle , Linear IGG and C3 the basement membrane  . Suprapubic catheter ([place])   . Wears hearing aid in both ears     Past Surgical History:  Procedure Laterality Date  . CARDIAC CATHETERIZATION N/A 04/21/2015   Procedure: Right/Left Heart Cath and Coronary Angiography;  Surgeon: [name], MD;  Location: [place] [facility];  Service: Cardiovascular;  Laterality: N/A;  . CARDIAC CATHETERIZATION N/A 06/18/2015   Procedure: Intravascular Pressure Wire/FFR Study;  Surgeon: [name], MD;  Location: [place] [facility]  [facility];  Service: Cardiovascular;  Laterality: N/A;  . CARDIAC CATHETERIZATION N/A 06/18/2015   Procedure: Coronary Stent Intervention;  Surgeon: [name], MD;  Location: [place] [facility];  Service: Cardiovascular;  Laterality: N/A;  . CARDIAC CATHETERIZATION N/A 06/18/2015   Procedure: Right/Left Heart Cath and Coronary Angiography;  Surgeon: [name], MD;  Location: [place] [facility];  Service: Cardiovascular;  Laterality: N/A;  . CARDIAC CATHETERIZATION N/A 06/23/2015   Procedure: Left Heart Cath and Cors/Grafts Angiography;  Surgeon: [name], MD;  Location: [place] [facility];  Service: Cardiovascular;  Laterality: N/A;  . CARDIAC CATHETERIZATION N/A 01/17/2016   Procedure: Right/Left Heart Cath and Coronary Angiography;  Surgeon: [name], MD;  Location: [place] [facility];  Service:  Cardiovascular;  Laterality: N/A;  . CATARACT EXTRACTION, BILATERAL    . COLONOSCOPY  06/26/2017  . CYSTOSCOPY WITH BIOPSY N/A 07/30/2018   Procedure: CYSTOSCOPY WITH BIOPSY/FULGURATION, CYSTOLTHOLAPAXY;  Surgeon: [name], MD;  [facility];  Service: Urology;  Laterality: N/A;  . CYSTOSCOPY WITH URETHRAL DILATATION N/A 08/18/2019   Procedure: CYSTOSCOPY WITH URETHRAL DILATATION USING BALLOON OR LASER/ SPURO PUBIC TUBE CHANGE LASER EXCISION OF URETHRAL STRICTURE RETROGRADE URETHROGRAM ANTEGRADE CYSTOGRAM;  Surgeon: [name], MD;  Location: [facility];  Service: Urology;  Laterality: N/A;  . FRACTURE SURGERY    . INGUINAL HERNIA REPAIR     patient does not remember this procedure  . LAPAROSCOPIC APPENDECTOMY N/A 11/16/2015   Procedure: APPENDECTOMY LAPAROSCOPIC;  Surgeon: [name], MD;  Location: [place];  Service: General;  Laterality: N/A;  . LEFT HEART CATH AND CORONARY ANGIOGRAPHY N/A 12/26/2016   Procedure: Left Heart Cath and Coronary Angiography;  Surgeon: Troy Sine, MD;  Location: [place] [facility];  Service: Cardiovascular;  Laterality: N/A;  . MELANOMA EXCISION Right 10/24/2016   Procedure: WIDE EXCISION MELANOMA RIGHT NECK AND RIGHT CHEST TIMES 2;  Surgeon: [name], MD;  Location: [place];  Service: General;  Laterality: Right;  right medial and lateral lesion of chest and right neck  . NASAL FRACTURE SURGERY     "broken years ago; several ORs to correct it"  . NASAL SEPTUM SURGERY    . PROSTATE BIOPSY  2014   "needle biopsy"  . TEE WITHOUT CARDIOVERSION N/A 02/15/2016   Procedure: TRANSESOPHAGEAL ECHOCARDIOGRAM (TEE);  Surgeon: [name], MD;  Location: [place];  Service: Open Heart Surgery;  Laterality: N/A;  . TEE WITHOUT CARDIOVERSION N/A 08/26/2019   Procedure: TRANSESOPHAGEAL ECHOCARDIOGRAM (TEE);  Surgeon: Pixie Casino, MD;  [facility];  Service: Cardiovascular;  Laterality: N/A;  .  TONSILLECTOMY    . TRANSCATHETER AORTIC VALVE REPLACEMENT, TRANSFEMORAL  02/15/2016  . TRANSCATHETER AORTIC VALVE REPLACEMENT, TRANSFEMORAL N/A 02/15/2016   Procedure: TRANSCATHETER AORTIC VALVE REPLACEMENT, TRANSFEMORAL;  Surgeon: [name], MD;  Location: [place];  Service: Open Heart Surgery;  Laterality: N/A;  . TRANSURETHRAL RESECTION OF PROSTATE  12/23/2018   Procedure: CYSTOSCOPY WITH  BLADDER BIOPSY WITH FULGERATION/ TRANSURETHRAL RESECTION PROSTATE  AND PROSTATE BIOPSY;  Surgeon: [name], MD;  Location: [facility];  Service: Urology;;    Family History  Problem Relation Age of Onset  . Brain cancer Father   . Lymphoma Mother   . Depression Daughter    Social History:  reports that he has quit smoking. His smoking use included cigarettes. He smoked 0.00 packs per day. He has never used smokeless tobacco. He reports current alcohol use. He reports that he  does not use drugs.  Allergies:  Allergies  Allergen Reactions  . Prednisone Other (See Comments)    Makes depressed    Medications:  No current facility-administered medications on file prior to encounter.   Current Outpatient Medications on File Prior to Encounter  Medication Sig Dispense Refill  . acetaminophen (TYLENOL) 500 MG tablet Take 500 mg by mouth every 6 (six) hours as needed for moderate pain.     [name] amoxicillin (AMOXIL) 500 MG tablet Take 1 tablet (500 mg total) by mouth 2 (two) times daily. 60 tablet 11  . aspirin EC 81 MG tablet Take 81 mg by mouth at bedtime.     . calcium carbonate (OS-CAL - DOSED IN MG OF ELEMENTAL CALCIUM) 1250 (500 Ca) MG tablet Take 1 tablet by mouth daily with breakfast.    . carvedilol (COREG) 3.125 MG tablet TAKE 1 TABLET BY MOUTH TWICE DAILY. (Patient taking differently: Take 3.125 mg by mouth 2 (two) times daily. ) 60 tablet 2  . DOXYCYCLINE PO Take by mouth.    . DULoxetine (CYMBALTA) 30 MG capsule Take 30 mg by mouth daily.    . enzalutamide (XTANDI) 40 MG capsule Take 160  mg by mouth daily with supper.     . ferrous sulfate 325 (65 FE) MG tablet Take 325 mg by mouth daily with breakfast.    . folic acid (FOLVITE) 1 MG tablet Take 1 mg by mouth See admin instructions. Everyday but Tues    . isosorbide mononitrate (IMDUR) 30 MG 24 hr tablet Take 1 tablet (30 mg total) by mouth daily. 30 tablet 11  . methotrexate (RHEUMATREX) 2.5 MG tablet Take 10 mg by mouth every Tuesday.     . MULTIPLE VITAMIN PO Take 1 tablet by mouth daily.    . nitroGLYCERIN (NITROSTAT) 0.4 MG SL tablet 1 TAB UNDER TONGUE AS NEEDED FOR CHEST PAIN. MAY REPEAT EVERY 5 MIN FOR A TOTAL OF 3 DOSES. (Patient taking differently: Place 0.4 mg under the tongue every 5 (five) minutes as needed for chest pain. ) 25 tablet 2  . rosuvastatin (CRESTOR) 10 MG tablet TAKE 1 TABLET ONCE DAILY. (Patient taking differently: Take 10 mg by mouth daily. ) 90 tablet 0     ROS: Unable to obtain due to dysphasia.   Physical Examination: There were no vitals taken for this visit.  HEENT: [place]/AT Lungs: Respirations unlabored Ext: No edema. Dressing to plantar aspect of left foot.   Neurologic Examination: Mental Status: Awake and alert. Follows about 25 % of simple motor commands. No dysarthria. Frequently perseverates. Unable to test orientation, repetition or naming due to receptive dysphasia.  Cranial Nerves: II:  Visual fields intact to threat bilaterally. PERRL. III,IV, VI: No ptosis. EOMI. No nystagmus.   V,VII: Aurelio Jew is symmetric. Reacts to tactile stimuli bilaterally.  VIII: hearing intact to some commands.  IX,X: No hoarseness XI: Symmetric XII: midline tongue extension  Motor: RUE 5/5 LUE 5/5 RLE 3/5 with gradual drift back to bed despite coaching LLE 3/5 with gradual drift back to bed despite coaching No pronator drift Sensory: Reacts to pinch BUE and BLE.  Deep Tendon Reflexes:  1+ bilateral brachioradialis and biceps Trace patellar reflexes bilaterally.  Plantars: Mute bilaterally   Cerebellar: No gross ataxia. Unable to follow commands for more detailed testing.  Gait: Deferred  Results for orders placed or performed during the hospital encounter of 11/25/19 (from the past 48 hour(s))  CBG monitoring, ED     Status: None  Collection Time: 11/25/19  2:13 PM  Result Value Ref Range   Glucose-Capillary 94 70 - 99 mg/dL   No results found.  Assessment: 84 y.o. male presenting after acute onset of aphasia, right facial droop and right sided weakness at home.  1. Exam reveals expressive and receptive aphasia.  2. CT head: No acute abnormality. Diffuse atrophy noted.  3. CTA head: No LVO. Redemonstrated multifocal high-grade stenoses within distal left anterior cerebral artery branches. Calcified atherosclerosis within the intracranial ICAs, but no more than mild stenosis. 4. CTA neck: The bilateral common and internal carotid arteries are patent within the neck without significant stenosis. Mild plaque at the carotid bifurcations and within the proximal ICAs. Significantly dominant left vertebral artery with redemonstrated moderate/severe ostial stenosis.  5. CT perfusion: No core infarct or region of critically hypoperfused parenchyma.  6. Stroke Risk Factors - atherosclerosis, cancer history and HLD.   7. DDx for presentation now includes cardioembolic TIA and seizure. Prior stroke work up during recent admission after presenting on 1/1 with aphasia was unrevealing for an etiology. Given that he has a history of "staring off" episodes and now a second presentation with stereotyped speech deficit, partial complex seizure is now felt to be highest on the DDx.  8. 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 The patient was unable to undergo TEE (attempted in the recent past). A cardiac CT was therefore performed and was found to be within  normal limits. 9. MRI on 1/1 showed no acute intracranial abnormality. Moderately advanced cerebral atrophy with chronic small vessel ischemic disease was noted.  Recommendations: 1. EEG (ordered).  2. If spot EEG is negative, may need to obtain LTM EEG to assess with greater sensitivity the possibility of recurrent partial complex seizures arising from the left cerebral hemisphere.  3. As noted during prior admission, cannot add Plavix to ASA due to chronic thrombocytopenia 4. Telemetry monitoring 5. Frequent neuro checks   @Electronically  signed: Dr. Kerney Elbe  11/25/2019, 2:18 PM

## 2019-11-25 NOTE — Discharge Planning (Signed)
EDCM received call from Marthenia Rolling, RN with [facility] with update on pt.  Pt is active with [facility] with NP and PT

## 2019-11-25 NOTE — H&P (Signed)
History and Physical    Corey Oneill [id number] DOB: 1936/04/17 DOA: 11/25/2019  PCP: [name], MD Consultants:  Linus Salmons - ID; Palliative care; [facility] - oncology; Burt Knack- cardiology; Mina Marble - dermatology; Junious Silk- urology; [facility] - podiatry Patient coming from:  Home - lives with wife; NOK: Wife, Corey Oneill, [id number]  Chief Complaint:  Aphasia, R-sided weakness  HPI: Corey Oneill is a 84 y.o. male with medical history significant of hearing loss; metastatic prostate CA with resultant radiation proctitis; chronic ITP; s/p TAVR; recurrent enterococcal bacteremia, on indefinite Ampicillin; grade 1 diastolic dysfunction; HLD; CAD; and bullous pemphigoid presenting with aphasia and R-sided weakness.  He was previously admitted from 1/1-2 with TIA.  There was concern for endocarditis given his recurrent enterococcal bacteremia but was unable to undergo TEE; cardiac CT was negative for vegetations.  Medication changes were not able to be made given his h/o ITP.    The patient reports mild confusion and is uncertain why he is here in the ER.  He is oriented x 3, however, and able to answer other questions.  ED Course:  Code stroke - resolution of symptoms.  Here for TIA evaluation recently.  Now neurology is concerned about seizures.  Dr. Cheral Marker is consulting.  Platelets are 60.  No tPA due to platelets.  Review of Systems: As per HPI; otherwise review of systems reviewed and negative.    Past Medical History:  Diagnosis Date  . Abdominal aortic atherosclerosis ([place])   . Anginal pain ([place])    last noted 08/17/19  . Bone cancer ([place])    Left hip  . BPPV (benign paroxysmal positional vertigo)   . Bullous pemphigoid   . CAD (coronary artery disease)    a.  LHC 8/16: Mid to distal LAD 30%, OM1 40%, proximal mid RCA 40%, distal RCA 60% >> FFR 0.69  >> PCI: 3 x 15 mm Resolute DES  . Depression   . Esophageal reflux   . Grade I diastolic dysfunction 123456   Noted on ECHO   .  H/O blood clots    "get them in my stool and urine" (11/12/2015)  . Heart murmur   . Hepatic lesion 12/08/2015   Stable 8 mm right hepatic lesion  . History of aortic stenosis    a. peak to peak gradient by LHC 8/16:  37 mmHg (moderately severe)   . History of appendicitis 2016  . History of blood transfusion 12/2018  . History of herpes labialis   . History of ITP    2018, thrombocytopenia  . History of kidney stones   . Hx of radiation therapy 04/14/13-06/09/13   prostate 7800 cGy, 40 sessions, seminal vesicles 5600 cGy 40 sessions  . Hydrocele 2006   Small  . Hyperlipidemia   . Insomnia    resolved  . LVH (left ventricular hypertrophy)    Moderate, noted on ECHO 09/2018  . Malignant melanoma in situ ([place]) 09/04/2016   Right neck and chest  . Metastasis from malignant neoplasm of prostate ([place]) 02/25/2019  . Prostate cancer ([place]) dx'd 2014  . Radiation proctitis   . Renal mass 2017   Bilateral renal masses  . S/P skin biopsy 04/09/2019   subepidermal cell poor vesicle , Linear IGG and C3 the basement membrane  . Suprapubic catheter ([place])   . Wears hearing aid in both ears     Past Surgical History:  Procedure Laterality Date  . CARDIAC CATHETERIZATION N/A 04/21/2015   Procedure: Right/Left Heart Cath and  Coronary Angiography;  Surgeon: [name], MD;  Location: [place] [facility];  Service: Cardiovascular;  Laterality: N/A;  . CARDIAC CATHETERIZATION N/A 06/18/2015   Procedure: Intravascular Pressure Wire/FFR Study;  Surgeon: [name], MD;  Location: [place] [facility];  Service: Cardiovascular;  Laterality: N/A;  . CARDIAC CATHETERIZATION N/A 06/18/2015   Procedure: Coronary Stent Intervention;  Surgeon: [name], MD;  Location: [place] [facility];  Service: Cardiovascular;  Laterality: N/A;  . CARDIAC CATHETERIZATION N/A 06/18/2015   Procedure: Right/Left Heart Cath and Coronary Angiography;  Surgeon: [name], MD;  Location: [place] [facility];  Service:  Cardiovascular;  Laterality: N/A;  . CARDIAC CATHETERIZATION N/A 06/23/2015   Procedure: Left Heart Cath and Cors/Grafts Angiography;  Surgeon: [name], MD;  Location: [place] [facility];  Service: Cardiovascular;  Laterality: N/A;  . CARDIAC CATHETERIZATION N/A 01/17/2016   Procedure: Right/Left Heart Cath and Coronary Angiography;  Surgeon: [name], MD;  Location: [place] [facility];  Service: Cardiovascular;  Laterality: N/A;  . CATARACT EXTRACTION, BILATERAL    . COLONOSCOPY  06/26/2017  . CYSTOSCOPY WITH BIOPSY N/A 07/30/2018   Procedure: CYSTOSCOPY WITH BIOPSY/FULGURATION, CYSTOLTHOLAPAXY;  Surgeon: [name], MD;  Location[facility];  Service: Urology;  Laterality: N/A;  . CYSTOSCOPY WITH URETHRAL DILATATION N/A 08/18/2019   Procedure: CYSTOSCOPY WITH URETHRAL DILATATION USING BALLOON OR LASER/ SPURO PUBIC TUBE CHANGE LASER EXCISION OF URETHRAL STRICTURE RETROGRADE URETHROGRAM ANTEGRADE CYSTOGRAM;  Surgeon: [name], MD;  Location: [facility];  Service: Urology;  Laterality: N/A;  . FRACTURE SURGERY    . INGUINAL HERNIA REPAIR     patient does not remember this procedure  . LAPAROSCOPIC APPENDECTOMY N/A 11/16/2015   Procedure: APPENDECTOMY LAPAROSCOPIC;  Surgeon: [name], MD;  Location: [place];  Service: General;  Laterality: N/A;  . LEFT HEART CATH AND CORONARY ANGIOGRAPHY N/A 12/26/2016   Procedure: Left Heart Cath and Coronary Angiography;  Surgeon: Troy Sine, MD;  Location: [place] [facility];  Service: Cardiovascular;  Laterality: N/A;  . MELANOMA EXCISION Right 10/24/2016   Procedure: WIDE EXCISION MELANOMA RIGHT NECK AND RIGHT CHEST TIMES 2;  Surgeon: [name], MD;  Location: [place];  Service: General;  Laterality: Right;  right medial and lateral lesion of chest and right neck  . NASAL FRACTURE SURGERY     "broken years ago; several ORs to correct it"  . NASAL SEPTUM SURGERY    . PROSTATE BIOPSY  2014    "needle biopsy"  . TEE WITHOUT CARDIOVERSION N/A 02/15/2016   Procedure: TRANSESOPHAGEAL ECHOCARDIOGRAM (TEE);  Surgeon: [name], MD;  Location: [place];  Service: Open Heart Surgery;  Laterality: N/A;  . TEE WITHOUT CARDIOVERSION N/A 08/26/2019   Procedure: TRANSESOPHAGEAL ECHOCARDIOGRAM (TEE);  Surgeon: Pixie Casino, MD;  Location: [facility];  Service: Cardiovascular;  Laterality: N/A;  . TONSILLECTOMY    . TRANSCATHETER AORTIC VALVE REPLACEMENT, TRANSFEMORAL  02/15/2016  . TRANSCATHETER AORTIC VALVE REPLACEMENT, TRANSFEMORAL N/A 02/15/2016   Procedure: TRANSCATHETER AORTIC VALVE REPLACEMENT, TRANSFEMORAL;  Surgeon: [name], MD;  Location: [place];  Service: Open Heart Surgery;  Laterality: N/A;  . TRANSURETHRAL RESECTION OF PROSTATE  12/23/2018   Procedure: CYSTOSCOPY WITH  BLADDER BIOPSY WITH FULGERATION/ TRANSURETHRAL RESECTION PROSTATE  AND PROSTATE BIOPSY;  Surgeon: [name], MD;  Location: [facility];  Service: Urology;;    Social History   Socioeconomic History  . Marital status: Married    Spouse name: Not on  file  . Number of children: 2  . Years of education: Not on file  . Highest education level: Not on file  Occupational History  . Not on file  Tobacco Use  . Smoking status: Former Smoker    Packs/day: 0.00    Types: Cigarettes  . Smokeless tobacco: Never Used  . Tobacco comment: "quit smoking cigarettes in the 1960s; quit smoking cigars in the 1990s  Substance and Sexual Activity  . Alcohol use: Yes    Comment: 2 DRINKS PER DAY  . Drug use: No  . Sexual activity: Not on file  Other Topics Concern  . Not on file  Social History Narrative  . Not on file   Social Determinants of Health   Financial Resource Strain:   . Difficulty of Paying Living Expenses: Not on file  Food Insecurity:   . Worried About Charity fundraiser in the Last Year: Not on file  . Ran Out of Food in the Last Year: Not on file  Transportation Needs:   . Lack of  Transportation (Medical): Not on file  . Lack of Transportation (Non-Medical): Not on file  Physical Activity:   . Days of Exercise per Week: Not on file  . Minutes of Exercise per Session: Not on file  Stress:   . Feeling of Stress : Not on file  Social Connections:   . Frequency of Communication with Friends and Family: Not on file  . Frequency of Social Gatherings with Friends and Family: Not on file  . Attends Religious Services: Not on file  . Active Member of Clubs or Organizations: Not on file  . Attends Archivist Meetings: Not on file  . Marital Status: Not on file  Intimate Partner Violence:   . Fear of Current or Ex-Partner: Not on file  . Emotionally Abused: Not on file  . Physically Abused: Not on file  . Sexually Abused: Not on file    Allergies  Allergen Reactions  . Prednisone Other (See Comments)    Makes the patient depressed    Family History  Problem Relation Age of Onset  . Brain cancer Father   . Lymphoma Mother   . Depression Daughter     Prior to Admission medications   Medication Sig Start Date End Date Taking? Authorizing Provider  acetaminophen (TYLENOL) 500 MG tablet Take 500 mg by mouth every 6 (six) hours as needed for moderate pain.     [provider]  amoxicillin (AMOXIL) 500 MG tablet Take 1 tablet (500 mg total) by mouth 2 (two) times daily. 11/07/19   Thayer Headings, MD  aspirin EC 81 MG tablet Take 81 mg by mouth at bedtime.     [provider]  calcium carbonate (OS-CAL - DOSED IN MG OF ELEMENTAL CALCIUM) 1250 (500 Ca) MG tablet Take 1 tablet by mouth daily with breakfast.    [provider]  carvedilol (COREG) 3.125 MG tablet TAKE 1 TABLET BY MOUTH TWICE DAILY. Patient taking differently: Take 3.125 mg by mouth 2 (two) times daily.  06/26/19   [name], MD  DOXYCYCLINE PO Take by mouth.    [provider]  DULoxetine (CYMBALTA) 30 MG capsule Take 30 mg by mouth daily.    [provider]  enzalutamide Gillermina Phy) 40 MG capsule Take 160 mg by mouth daily with supper.     [provider]  ferrous sulfate 325 (65 FE) MG tablet Take 325 mg by mouth daily with  breakfast.    [provider]  folic acid (FOLVITE) 1 MG tablet Take 1 mg by mouth See admin instructions. Everyday but Tues    [provider]  isosorbide mononitrate (IMDUR) 30 MG 24 hr tablet Take 1 tablet (30 mg total) by mouth daily. 10/22/18 11/15/19  Richardson Dopp T, PA-C  methotrexate (RHEUMATREX) 2.5 MG tablet Take 10 mg by mouth every Tuesday.     [provider]  MULTIPLE VITAMIN PO Take 1 tablet by mouth daily.    [provider]  nitroGLYCERIN (NITROSTAT) 0.4 MG SL tablet 1 TAB UNDER TONGUE AS NEEDED FOR CHEST PAIN. MAY REPEAT EVERY 5 MIN FOR A TOTAL OF 3 DOSES. Patient taking differently: Place 0.4 mg under the tongue every 5 (five) minutes as needed for chest pain.  07/07/19   [name], MD  rosuvastatin (CRESTOR) 10 MG tablet TAKE 1 TABLET ONCE DAILY. Patient taking differently: Take 10 mg by mouth daily.  05/26/19   Carlena Hurl, PA-C    Physical Exam: Vitals:   11/25/19 1430 11/25/19 1600 11/25/19 1615 11/25/19 1630  BP: 128/79 (!) 161/82 (!) 162/87 (!) 171/88  Pulse: 74 71 73 65  Resp: 14 20 16 19   Temp: (!) 97.5 F (36.4 C)     TempSrc: Temporal     SpO2:  98% 100% 100%     . General:  Appears calm and comfortable and is NAD . Eyes:   EOMI, normal lids, iris . ENT:  Very hard of hearing, grossly normal lips & tongue, mmm . Neck:  no LAD, masses or thyromegaly; no carotid bruits . Cardiovascular:  RRR, no m/r/g. No LE edema.  [name] Respiratory:   CTA bilaterally with no wheezes/rales/rhonchi.  Normal respiratory effort. . Abdomen:  soft, NT, ND, NABS . Skin:  Healing, covered, blistering lesions on both feet c/w recent diagnosis of bullous pemphigoid . Musculoskeletal:  grossly normal tone BUE/BLE, good ROM, no bony  abnormality . Psychiatric:  grossly normal mood and affect, speech fluent and appropriate, AOx3 . Neurologic:  CN 2-12 grossly intact, moves all extremities in coordinated fashion, sensation intact    Radiological Exams on Admission: EEG  Result Date: 11/25/2019 Lora Havens, MD     11/25/2019  5:42 PM Patient Name: Corey Oneill MRN: [id number] Epilepsy Attending: Lora Havens Referring Physician/Provider: Dr Kerney Elbe Date: 11/25/2019 Duration: 23.41 mins Patient history: 84 y.o. male with h/o "staring spells" presenting after acute onset of aphasia, right facial droop and right sided weakness at home. EEG to evaluate for seizure. Level of alertness: awake AEDs during EEG study: None Technical aspects: This EEG study was done with scalp electrodes positioned according to the 10-20 International system of electrode placement. Electrical activity was acquired at a sampling rate of 500Hz  and reviewed with a high frequency filter of 70Hz  and a low frequency filter of 1Hz . EEG data were recorded continuously and digitally stored. DESCRIPTION: No clear posterior dominant rhythm was seen. EEG showed continuous generalized 3-5hz  theta-delta slowing as well as intermittent rhythmic generalized 2-3Hz  delta slowing.  Hyperventilation and photic stimulation were not performed. ABNORMALITY - Continuous slow, generalized - Intermittent rhythmic slow, generalized IMPRESSION: This study is suggestive of moderate diffuse encephalopathy, non specific to etiology.  No seizures or epileptiform discharges were seen throughout the recording. Lora Havens   CT Code Stroke CTA Head W/WO contrast  Result Date: 11/25/2019 CLINICAL DATA:  Neuro deficit, acute, stroke suspected. EXAM: CT ANGIOGRAPHY HEAD AND NECK CT PERFUSION  BRAIN TECHNIQUE: Multidetector CT imaging of the head and neck was performed using the standard protocol during bolus administration of intravenous contrast. Multiplanar CT image  reconstructions and MIPs were obtained to evaluate the vascular anatomy. Carotid stenosis measurements (when applicable) are obtained utilizing NASCET criteria, using the distal internal carotid diameter as the denominator. Multiphase CT imaging of the brain was performed following IV bolus contrast injection. Subsequent parametric perfusion maps were calculated using RAPID software. CONTRAST:  139mL OMNIPAQUE IOHEXOL 350 MG/ML SOLN COMPARISON:  Noncontrast head CT performed earlier the same day, CT angiogram head/neck 11/14/2019 FINDINGS: CTA NECK FINDINGS Aortic arch: Standard aortic branching. Scattered soft and calcified plaque within the visualized aortic arch and proximal major branch vessels of the neck. Right carotid system: CCA widely patent to the bifurcation. Mild atherosclerotic plaque within the carotid bifurcation and proximal ICA. No measurable stenosis of the proximal ICA relative to the more distal vessel. Left carotid system: CCA patent to the bifurcation. Soft and calcified plaque within the carotid bifurcation and proximal ICA. No measurable stenosis of the proximal ICA as compared to the more distal vessel. Vertebral arteries: The left vertebral artery is significantly dominant. The non dominant right vertebral artery is patent throughout the neck without significant stenosis (50% or greater). Mixed plaque results in unchanged moderate/severe ostial stenosis at the origin of the dominant left vertebral artery. Distal to this, the left vertebral artery is patent within the neck without significant stenosis (50% or greater). Skeleton: No acute bony abnormality. Cervical spondylosis with multilevel shallow posterior disc osteophytes, uncovertebral and facet hypertrophy. No high-grade bony spinal canal stenosis. Other neck: No neck mass or cervical lymphadenopathy. Subcentimeter right thyroid lobe nodule, not meeting consensus criteria for ultrasound follow-up. Upper chest: Ill-defined opacity  within the posterior aspect of the partially visualized right upper lobe suspicious for pneumonia. Review of the MIP images confirms the above findings CTA HEAD FINDINGS Anterior circulation: The intracranial internal carotid arteries are patent bilaterally with scattered calcified plaque but no more than mild stenosis. The right middle and anterior cerebral arteries are patent without proximal branch occlusion or high-grade proximal stenosis. The left middle cerebral artery is patent without proximal branch occlusion or high-grade proximal arterial stenosis identified. The left anterior cerebral artery is patent without significant proximal stenosis. Redemonstrated multifocal high-grade stenoses within distal left anterior cerebral artery branches (for instance as seen on series 6, image 58) (series 11, image 23). No intracranial aneurysm is identified. Posterior circulation: The non dominant intracranial right vertebral artery is patent and appears to terminate as the right PICA. The dominant intracranial left vertebral artery is patent without significant stenosis, as is the basilar artery. The bilateral posterior cerebral arteries are patent without significant proximal stenosis. Posterior communicating arteries are poorly delineated and may be hypoplastic or absent bilaterally. Venous sinuses: Within limitations of contrast timing, no convincing thrombus. Anatomic variants: As described Review of the MIP images confirms the above findings CT Brain Perfusion Findings: CBF (<30%) Volume: None Perfusion (Tmax>6.0s) volume: None Mismatch Volume: None Infarction Location: None These results were communicated to Dr. Cheral Marker At 3:33 pmon 1/12/2021by text page via the [facility] messaging system. IMPRESSION: CTA neck: 1. The bilateral common and internal carotid arteries are patent within the neck without significant stenosis. Mild plaque at the carotid bifurcations and within the proximal ICAs. 2. Significantly dominant  left vertebral artery with redemonstrated moderate/severe ostial stenosis. The vertebral arteries are otherwise patent within the neck without significant stenosis. 3. Ill-defined airspace opacity within the posterior aspect  of the partially imaged right upper lobe suspicious for pneumonia. CTA head: 1. No intracranial large vessel occlusion or proximal high-grade arterial stenosis identified. 2. Redemonstrated multifocal high-grade stenoses within distal left anterior cerebral artery branches. 3. Calcified atherosclerosis within the intracranial ICAs, but no more than mild stenosis. CT perfusion head: 1. The perfusion software detects no core infarct. 2. The perfusion software detects no region of critically hypoperfused parenchyma utilizing a Tmax>6 seconds threshold. Electronically Signed   By: Kellie Simmering DO   On: 11/25/2019 15:31   CT Code Stroke CTA Neck W/WO contrast  Result Date: 11/25/2019 CLINICAL DATA:  Neuro deficit, acute, stroke suspected. EXAM: CT ANGIOGRAPHY HEAD AND NECK CT PERFUSION BRAIN TECHNIQUE: Multidetector CT imaging of the head and neck was performed using the standard protocol during bolus administration of intravenous contrast. Multiplanar CT image reconstructions and MIPs were obtained to evaluate the vascular anatomy. Carotid stenosis measurements (when applicable) are obtained utilizing NASCET criteria, using the distal internal carotid diameter as the denominator. Multiphase CT imaging of the brain was performed following IV bolus contrast injection. Subsequent parametric perfusion maps were calculated using RAPID software. CONTRAST:  145mL OMNIPAQUE IOHEXOL 350 MG/ML SOLN COMPARISON:  Noncontrast head CT performed earlier the same day, CT angiogram head/neck 11/14/2019 FINDINGS: CTA NECK FINDINGS Aortic arch: Standard aortic branching. Scattered soft and calcified plaque within the visualized aortic arch and proximal major branch vessels of the neck. Right carotid system: CCA  widely patent to the bifurcation. Mild atherosclerotic plaque within the carotid bifurcation and proximal ICA. No measurable stenosis of the proximal ICA relative to the more distal vessel. Left carotid system: CCA patent to the bifurcation. Soft and calcified plaque within the carotid bifurcation and proximal ICA. No measurable stenosis of the proximal ICA as compared to the more distal vessel. Vertebral arteries: The left vertebral artery is significantly dominant. The non dominant right vertebral artery is patent throughout the neck without significant stenosis (50% or greater). Mixed plaque results in unchanged moderate/severe ostial stenosis at the origin of the dominant left vertebral artery. Distal to this, the left vertebral artery is patent within the neck without significant stenosis (50% or greater). Skeleton: No acute bony abnormality. Cervical spondylosis with multilevel shallow posterior disc osteophytes, uncovertebral and facet hypertrophy. No high-grade bony spinal canal stenosis. Other neck: No neck mass or cervical lymphadenopathy. Subcentimeter right thyroid lobe nodule, not meeting consensus criteria for ultrasound follow-up. Upper chest: Ill-defined opacity within the posterior aspect of the partially visualized right upper lobe suspicious for pneumonia. Review of the MIP images confirms the above findings CTA HEAD FINDINGS Anterior circulation: The intracranial internal carotid arteries are patent bilaterally with scattered calcified plaque but no more than mild stenosis. The right middle and anterior cerebral arteries are patent without proximal branch occlusion or high-grade proximal stenosis. The left middle cerebral artery is patent without proximal branch occlusion or high-grade proximal arterial stenosis identified. The left anterior cerebral artery is patent without significant proximal stenosis. Redemonstrated multifocal high-grade stenoses within distal left anterior cerebral artery  branches (for instance as seen on series 6, image 58) (series 11, image 23). No intracranial aneurysm is identified. Posterior circulation: The non dominant intracranial right vertebral artery is patent and appears to terminate as the right PICA. The dominant intracranial left vertebral artery is patent without significant stenosis, as is the basilar artery. The bilateral posterior cerebral arteries are patent without significant proximal stenosis. Posterior communicating arteries are poorly delineated and may be hypoplastic or absent bilaterally. Venous sinuses:  Within limitations of contrast timing, no convincing thrombus. Anatomic variants: As described Review of the MIP images confirms the above findings CT Brain Perfusion Findings: CBF (<30%) Volume: None Perfusion (Tmax>6.0s) volume: None Mismatch Volume: None Infarction Location: None These results were communicated to Dr. Cheral Marker At 3:33 pmon 1/12/2021by text page via the [facility] messaging system. IMPRESSION: CTA neck: 1. The bilateral common and internal carotid arteries are patent within the neck without significant stenosis. Mild plaque at the carotid bifurcations and within the proximal ICAs. 2. Significantly dominant left vertebral artery with redemonstrated moderate/severe ostial stenosis. The vertebral arteries are otherwise patent within the neck without significant stenosis. 3. Ill-defined airspace opacity within the posterior aspect of the partially imaged right upper lobe suspicious for pneumonia. CTA head: 1. No intracranial large vessel occlusion or proximal high-grade arterial stenosis identified. 2. Redemonstrated multifocal high-grade stenoses within distal left anterior cerebral artery branches. 3. Calcified atherosclerosis within the intracranial ICAs, but no more than mild stenosis. CT perfusion head: 1. The perfusion software detects no core infarct. 2. The perfusion software detects no region of critically hypoperfused parenchyma utilizing  a Tmax>6 seconds threshold. Electronically Signed   By: Kellie Simmering DO   On: 11/25/2019 15:31   CT Code Stroke Cerebral Perfusion with contrast  Result Date: 11/25/2019 CLINICAL DATA:  Neuro deficit, acute, stroke suspected. EXAM: CT ANGIOGRAPHY HEAD AND NECK CT PERFUSION BRAIN TECHNIQUE: Multidetector CT imaging of the head and neck was performed using the standard protocol during bolus administration of intravenous contrast. Multiplanar CT image reconstructions and MIPs were obtained to evaluate the vascular anatomy. Carotid stenosis measurements (when applicable) are obtained utilizing NASCET criteria, using the distal internal carotid diameter as the denominator. Multiphase CT imaging of the brain was performed following IV bolus contrast injection. Subsequent parametric perfusion maps were calculated using RAPID software. CONTRAST:  142mL OMNIPAQUE IOHEXOL 350 MG/ML SOLN COMPARISON:  Noncontrast head CT performed earlier the same day, CT angiogram head/neck 11/14/2019 FINDINGS: CTA NECK FINDINGS Aortic arch: Standard aortic branching. Scattered soft and calcified plaque within the visualized aortic arch and proximal major branch vessels of the neck. Right carotid system: CCA widely patent to the bifurcation. Mild atherosclerotic plaque within the carotid bifurcation and proximal ICA. No measurable stenosis of the proximal ICA relative to the more distal vessel. Left carotid system: CCA patent to the bifurcation. Soft and calcified plaque within the carotid bifurcation and proximal ICA. No measurable stenosis of the proximal ICA as compared to the more distal vessel. Vertebral arteries: The left vertebral artery is significantly dominant. The non dominant right vertebral artery is patent throughout the neck without significant stenosis (50% or greater). Mixed plaque results in unchanged moderate/severe ostial stenosis at the origin of the dominant left vertebral artery. Distal to this, the left vertebral  artery is patent within the neck without significant stenosis (50% or greater). Skeleton: No acute bony abnormality. Cervical spondylosis with multilevel shallow posterior disc osteophytes, uncovertebral and facet hypertrophy. No high-grade bony spinal canal stenosis. Other neck: No neck mass or cervical lymphadenopathy. Subcentimeter right thyroid lobe nodule, not meeting consensus criteria for ultrasound follow-up. Upper chest: Ill-defined opacity within the posterior aspect of the partially visualized right upper lobe suspicious for pneumonia. Review of the MIP images confirms the above findings CTA HEAD FINDINGS Anterior circulation: The intracranial internal carotid arteries are patent bilaterally with scattered calcified plaque but no more than mild stenosis. The right middle and anterior cerebral arteries are patent without proximal branch occlusion or high-grade proximal stenosis.  The left middle cerebral artery is patent without proximal branch occlusion or high-grade proximal arterial stenosis identified. The left anterior cerebral artery is patent without significant proximal stenosis. Redemonstrated multifocal high-grade stenoses within distal left anterior cerebral artery branches (for instance as seen on series 6, image 58) (series 11, image 23). No intracranial aneurysm is identified. Posterior circulation: The non dominant intracranial right vertebral artery is patent and appears to terminate as the right PICA. The dominant intracranial left vertebral artery is patent without significant stenosis, as is the basilar artery. The bilateral posterior cerebral arteries are patent without significant proximal stenosis. Posterior communicating arteries are poorly delineated and may be hypoplastic or absent bilaterally. Venous sinuses: Within limitations of contrast timing, no convincing thrombus. Anatomic variants: As described Review of the MIP images confirms the above findings CT Brain Perfusion  Findings: CBF (<30%) Volume: None Perfusion (Tmax>6.0s) volume: None Mismatch Volume: None Infarction Location: None These results were communicated to Dr. Cheral Marker At 3:33 pmon 1/12/2021by text page via the [facility] messaging system. IMPRESSION: CTA neck: 1. The bilateral common and internal carotid arteries are patent within the neck without significant stenosis. Mild plaque at the carotid bifurcations and within the proximal ICAs. 2. Significantly dominant left vertebral artery with redemonstrated moderate/severe ostial stenosis. The vertebral arteries are otherwise patent within the neck without significant stenosis. 3. Ill-defined airspace opacity within the posterior aspect of the partially imaged right upper lobe suspicious for pneumonia. CTA head: 1. No intracranial large vessel occlusion or proximal high-grade arterial stenosis identified. 2. Redemonstrated multifocal high-grade stenoses within distal left anterior cerebral artery branches. 3. Calcified atherosclerosis within the intracranial ICAs, but no more than mild stenosis. CT perfusion head: 1. The perfusion software detects no core infarct. 2. The perfusion software detects no region of critically hypoperfused parenchyma utilizing a Tmax>6 seconds threshold. Electronically Signed   By: Kellie Simmering DO   On: 11/25/2019 15:31   CT HEAD CODE STROKE WO CONTRAST  Result Date: 11/25/2019 CLINICAL DATA:  Code stroke.  Aphasia.  Right-sided weakness. EXAM: CT HEAD WITHOUT CONTRAST TECHNIQUE: Contiguous axial images were obtained from the base of the skull through the vertex without intravenous contrast. COMPARISON:  MRI 11/14/2019 FINDINGS: Brain: Generalized atrophy. Chronic small-vessel ischemic changes throughout the brain as seen previously. No CT evidence of acute infarction, mass lesion, hemorrhage, hydrocephalus or extra-axial collection. Vascular: There is atherosclerotic calcification of the major vessels at the base of the brain. Skull: Normal  Sinuses/Orbits: Clear/normal Other: None ASPECTS ([place] Stroke Program Early CT Score) - Ganglionic level infarction (caudate, lentiform nuclei, internal capsule, insula, M1-M3 cortex): 7 - Supraganglionic infarction (M4-M6 cortex): 3 Total score (0-10 with 10 being normal): 10 IMPRESSION: 1. No acute finding by CT. Atrophy and chronic small-vessel ischemic changes as seen previously. 2. ASPECTS is 10 3. These results were communicated to Dr. Cheral Marker at 2:29 pmon 1/12/2021by text page via the [facility] messaging system. Electronically Signed   By: Nelson Chimes M.D.   On: 11/25/2019 14:30    EKG: Independently reviewed.  NSR with rate 72; nonspecific ST changes with no evidence of acute ischemia   Labs on Admission: I have personally reviewed the available labs and imaging studies at the time of the admission.  Pertinent labs:   Albumin 3.1 WBC 4.4 Hgb 10.8 Platelets 60 INR 1.2   Assessment/Plan Principal Problem:   TIA (transient ischemic attack) Active Problems:   Hyperlipidemia   S/P TAVR (transcatheter aortic valve replacement)   History of ITP   Metastasis  from malignant neoplasm of prostate ([place])   Bullous pemphigoid   Bacteremia due to Enterococcus   Essential hypertension    Possible TIA -Patient with recent admission for TIA evaluation presenting with recurrent acute right-sided weakness and aphasia; symptoms have completely resolved and the patient has no recollection of why he came to the ER or how he got here -Concerning for TIA/CVA -Also concerning for possible seizure  -Will place in observation status for further evaluation -Telemetry monitoring -Continue ASA daily - unfortunately, even if he is having recurrent TIA/CVA, his treatment options are limited due to his chronic ITP -Neurology consult -PT/OT/ST Consults -Dr. Meda Coffee is trying to get a Holter monitor to assess for dysrhythmia as cause of symptoms -EEG is pending -He also will need likely driving  restriction for at least 6 months -Seizure precautions -Ativan prn  HTN -Does not appear to need permissive HTN at this time -Continue Coreg   HLD -Continue Crestor   DM -Recent A1c shows reasonably good control -Hold home PO medications (amaryl, Jardiance, and Janumet) -Will order moderate-scale SSI  S/p TAVR -With recurrent bacteremia, there is concern for bacterial endocarditis -Unable to perform TEE due to large hiatal hernia -Recent coronary CTA negative for apparent vegetations -Will recheck Blood cultures -Patient remains on lifelong Amoxil per ID -He is also on continuous doxy but this appears to be for bullous pemphigoid  Chronic ITP -Continue ASA  -Unable to add/change antiplatelet agent due to perpetually low platelets  Metastatic prostate CA -Has a MOST form in the chart as well as DNR -Palliative care is on board  -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    DVT prophylaxis:  Lovenox  Code Status: Full - confirmed with patient/family Family Communication: Daughter present throughout evaluation Disposition Plan:  Home once clinically improved Consults called: Neurology; PT/OT/ST/SW/Nutrition  Admission status: Admit - It is my clinical opinion that admission to INPATIENT is reasonable and necessary because of the expectation that this patient will require hospital care that crosses at least 2 midnights to treat this condition based on the medical complexity of the problems presented.  Given the aforementioned information, the predictability of an adverse outcome is felt to be significant.    Karmen Bongo MD [facility]   How to contact the [facility]  Attending or Consulting provider [place] or covering provider during after hours [place], for this patient?  1. Check the care team in [facility] and look for a) attending/consulting [facility] provider listed and b) the [facility] team listed 2. Log into [email or web address] and use [place]'s universal password to access. If you do not have the password, please contact the hospital operator. 3. Locate the [facility] provider you are looking for under [facility] and page to a number that you can be directly reached. 4. If you still have difficulty reaching the provider, please page the [facility] (Director on Call) for the Hospitalists listed on amion for assistance.   11/25/2019, 5:55 PM

## 2019-11-25 NOTE — Progress Notes (Signed)
EEG completed, results pending. 

## 2019-11-25 NOTE — ED Notes (Signed)
Dr. Lorin Mercy in to assess pt for admission.  Dr. Lorin Mercy will call wife with update on pt.

## 2019-11-25 NOTE — ED Notes (Signed)
Speech clear , oriented x 4 , follows commands -- Dr. Cheral Marker made aware.

## 2019-11-26 ENCOUNTER — Observation Stay (HOSPITAL_COMMUNITY): Payer: Medicare Other

## 2019-11-26 DIAGNOSIS — Z7982 Long term (current) use of aspirin: Secondary | ICD-10-CM | POA: Diagnosis not present

## 2019-11-26 DIAGNOSIS — E785 Hyperlipidemia, unspecified: Secondary | ICD-10-CM | POA: Diagnosis present

## 2019-11-26 DIAGNOSIS — L120 Bullous pemphigoid: Secondary | ICD-10-CM | POA: Diagnosis present

## 2019-11-26 DIAGNOSIS — Z87891 Personal history of nicotine dependence: Secondary | ICD-10-CM | POA: Diagnosis not present

## 2019-11-26 DIAGNOSIS — C61 Malignant neoplasm of prostate: Secondary | ICD-10-CM | POA: Diagnosis not present

## 2019-11-26 DIAGNOSIS — R569 Unspecified convulsions: Secondary | ICD-10-CM

## 2019-11-26 DIAGNOSIS — Z86006 Personal history of melanoma in-situ: Secondary | ICD-10-CM | POA: Diagnosis not present

## 2019-11-26 DIAGNOSIS — I2510 Atherosclerotic heart disease of native coronary artery without angina pectoris: Secondary | ICD-10-CM | POA: Diagnosis present

## 2019-11-26 DIAGNOSIS — G9340 Encephalopathy, unspecified: Secondary | ICD-10-CM | POA: Diagnosis present

## 2019-11-26 DIAGNOSIS — Z862 Personal history of diseases of the blood and blood-forming organs and certain disorders involving the immune mechanism: Secondary | ICD-10-CM | POA: Diagnosis not present

## 2019-11-26 DIAGNOSIS — Z79899 Other long term (current) drug therapy: Secondary | ICD-10-CM | POA: Diagnosis not present

## 2019-11-26 DIAGNOSIS — H9190 Unspecified hearing loss, unspecified ear: Secondary | ICD-10-CM | POA: Diagnosis present

## 2019-11-26 DIAGNOSIS — Z86718 Personal history of other venous thrombosis and embolism: Secondary | ICD-10-CM | POA: Diagnosis not present

## 2019-11-26 DIAGNOSIS — D693 Immune thrombocytopenic purpura: Secondary | ICD-10-CM | POA: Diagnosis present

## 2019-11-26 DIAGNOSIS — Z20822 Contact with and (suspected) exposure to covid-19: Secondary | ICD-10-CM | POA: Diagnosis present

## 2019-11-26 DIAGNOSIS — R531 Weakness: Secondary | ICD-10-CM | POA: Diagnosis not present

## 2019-11-26 DIAGNOSIS — R29810 Facial weakness: Secondary | ICD-10-CM | POA: Diagnosis present

## 2019-11-26 DIAGNOSIS — Z807 Family history of other malignant neoplasms of lymphoid, hematopoietic and related tissues: Secondary | ICD-10-CM | POA: Diagnosis not present

## 2019-11-26 DIAGNOSIS — I10 Essential (primary) hypertension: Secondary | ICD-10-CM | POA: Diagnosis present

## 2019-11-26 DIAGNOSIS — R4701 Aphasia: Secondary | ICD-10-CM | POA: Diagnosis present

## 2019-11-26 DIAGNOSIS — R410 Disorientation, unspecified: Secondary | ICD-10-CM | POA: Diagnosis not present

## 2019-11-26 DIAGNOSIS — C799 Secondary malignant neoplasm of unspecified site: Secondary | ICD-10-CM | POA: Diagnosis not present

## 2019-11-26 DIAGNOSIS — Z8546 Personal history of malignant neoplasm of prostate: Secondary | ICD-10-CM | POA: Diagnosis not present

## 2019-11-26 DIAGNOSIS — G40209 Localization-related (focal) (partial) symptomatic epilepsy and epileptic syndromes with complex partial seizures, not intractable, without status epilepticus: Secondary | ICD-10-CM | POA: Diagnosis present

## 2019-11-26 DIAGNOSIS — B952 Enterococcus as the cause of diseases classified elsewhere: Secondary | ICD-10-CM | POA: Diagnosis present

## 2019-11-26 DIAGNOSIS — Z66 Do not resuscitate: Secondary | ICD-10-CM | POA: Diagnosis present

## 2019-11-26 DIAGNOSIS — Z808 Family history of malignant neoplasm of other organs or systems: Secondary | ICD-10-CM | POA: Diagnosis not present

## 2019-11-26 DIAGNOSIS — Z515 Encounter for palliative care: Secondary | ICD-10-CM | POA: Diagnosis present

## 2019-11-26 DIAGNOSIS — Z952 Presence of prosthetic heart valve: Secondary | ICD-10-CM | POA: Diagnosis not present

## 2019-11-26 MED ORDER — TRAZODONE HCL 50 MG PO TABS
25.0000 mg | ORAL_TABLET | Freq: Once | ORAL | Status: AC
  Administered 2019-11-26: 25 mg via ORAL
  Filled 2019-11-26: qty 1

## 2019-11-26 NOTE — Progress Notes (Signed)
[facility] PROGRESS NOTE  Corey Oneill [id number] DOB: 1936/03/21 DOA: 11/25/2019 PCP: Corey Lung, MD  Assessment/Plan:  #1. Possible seizure. Presents with right sided weakness, right facial droop, aphasia.  CT head, CTA head and neck as well as CT perfusion did not reveal any abnormalities. Evaluated by neuro who opine possible partial complex seizure as he has hx of same.  -24 hour eeg with video per neuro -seizure precautions -continue tele  #2. Hypertension. BP high end of normal. Home meds include coreg, imdur. -continue home med -monitor  #3. Chronic ITP. Platelets 60. Appears to be close to baseline. No s/sx bleeding -monitor  #4. Hyperlipidemia. Lipid panel 1/21 with LDL 108 otherwise within limits of normal.   #5. Metastatic prostate CA. 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  -active surveillance  #6. S/P TAVR. Hx recurrent bacteremia. Unable to perform TEE due to large hiatal hernia. Coronarly CTA 1/21 negative for vegitation -follow blood cultures -continue lifelong amoxil  Code Status: dnr Family Communication: pateint Disposition Plan: home    Consultants:  Corey Oneill neuro  Procedures:  eeg  Antibiotics:    HPI/Subjective: Awake eating breakfast. Denies pain/discomfort. No recollection of circumstances that brought him here  Objective: Vitals:   11/26/19 0415 11/26/19 0956  BP: (!) 180/106 110/63  Pulse: 92 72  Resp: 17 17  Temp: 98 F (36.7 C) 97.8 F (36.6 C)  SpO2: 98% 99%    Intake/Output Summary (Last 24 hours) at 11/26/2019 1048 Last data filed at 11/26/2019 0430 Gross per 24 hour  Intake --  Output 40202 ml  Net -40202 ml   There were no vitals filed for this visit.  Exam:   General:  Awake alert thin somewhat frail appearing  Cardiovascular: rrr no mgr no LE edema  Respiratory: normal effort BS clear bilaterally no wheeze  Abdomen: non distended non-tender +BS    Musculoskeletal: joints without swelling/erythema   Data Reviewed: Basic Metabolic Panel: Recent Labs  Lab 11/25/19 1413 11/25/19 1417  NA 134* 134*  K 3.9 4.0  CL 100 100  CO2 24  --   GLUCOSE 100* 99  BUN 18 20  CREATININE 0.84 0.70  CALCIUM 8.7*  --    Liver Function Tests: Recent Labs  Lab 11/25/19 1413  AST 21  ALT 9  ALKPHOS 71  BILITOT 0.3  PROT 6.4*  ALBUMIN 3.1*   No results for input(s): LIPASE, AMYLASE in the last 168 hours. No results for input(s): AMMONIA in the last 168 hours. CBC: Recent Labs  Lab 11/25/19 1413 11/25/19 1417  WBC 4.4  --   NEUTROABS 2.6  --   HGB 10.8* 10.5*  HCT 32.3* 31.0*  MCV 99.1  --   PLT 60*  --    Cardiac Enzymes: No results for input(s): CKTOTAL, CKMB, CKMBINDEX, TROPONINI in the last 168 hours. BNP (last 3 results) No results for input(s): BNP in the last 8760 hours.  ProBNP (last 3 results) No results for input(s): PROBNP in the last 8760 hours.  CBG: Recent Labs  Lab 11/25/19 1413  GLUCAP 94    Recent Results (from the past 240 hour(s))  Respiratory Panel by RT PCR (Flu A&B, Covid) - Nasopharyngeal Swab     Status: None   Collection Time: 11/25/19  4:31 PM   Specimen: Nasopharyngeal Swab  Result Value Ref Range Status   SARS Coronavirus 2 by RT PCR NEGATIVE NEGATIVE Final    Comment: (NOTE) SARS-CoV-2 target nucleic acids are  NOT DETECTED. The SARS-CoV-2 RNA is generally detectable in upper respiratoy specimens during the acute phase of infection. The lowest concentration of SARS-CoV-2 viral copies this assay can detect is 131 copies/mL. A negative result does not preclude SARS-Cov-2 infection and should not be used as the sole basis for treatment or other patient management decisions. A negative result may occur with  improper specimen collection/handling, submission of specimen other than nasopharyngeal swab, presence of viral mutation(s) within the areas targeted by this assay, and inadequate  number of viral copies (<131 copies/mL). A negative result must be combined with clinical observations, patient history, and epidemiological information. The expected result is Negative. Fact Sheet for Patients:  PinkCheek.be Fact Sheet for Healthcare Providers:  GravelBags.it This test is not yet ap proved or cleared by the [place] FDA and  has been authorized for detection and/or diagnosis of SARS-CoV-2 by FDA under an Emergency Use Authorization (EUA). This EUA will remain  in effect (meaning this test can be used) for the duration of the COVID-19 declaration under Section 564(b)(1) of the Act, 21 U.S.C. section 360bbb-3(b)(1), unless the authorization is terminated or revoked sooner.    Influenza A by PCR NEGATIVE NEGATIVE Final   Influenza B by PCR NEGATIVE NEGATIVE Final    Comment: (NOTE) The Xpert Xpress SARS-CoV-2/FLU/RSV assay is intended as an aid in  the diagnosis of influenza from Nasopharyngeal swab specimens and  should not be used as a sole basis for treatment. Nasal washings and  aspirates are unacceptable for Xpert Xpress SARS-CoV-2/FLU/RSV  testing. Fact Sheet for Patients: PinkCheek.be Fact Sheet for Healthcare Providers: GravelBags.it This test is not yet approved or cleared by the [place] FDA and  has been authorized for detection and/or diagnosis of SARS-CoV-2 by  FDA under an Emergency Use Authorization (EUA). This EUA will remain  in effect (meaning this test can be used) for the duration of the  Covid-19 declaration under Section 564(b)(1) of the Act, 21  U.S.C. section 360bbb-3(b)(1), unless the authorization is  terminated or revoked. Performed at [facility], [place] [address], [place], [place] [address]      Studies: EEG  Result Date: 11/25/2019 Corey Havens, MD     11/25/2019  5:42 PM Patient Name: Corey Oneill  MRN: [id number] Epilepsy Attending: Lora Oneill Referring Physician/Provider: Dr Corey Oneill Date: 11/25/2019 Duration: 23.41 mins Patient history: 84 y.o. male with h/o "staring spells" presenting after acute onset of aphasia, right facial droop and right sided weakness at home. EEG to evaluate for seizure. Level of alertness: awake AEDs during EEG study: None Technical aspects: This EEG study was done with scalp electrodes positioned according to the 10-20 International system of electrode placement. Electrical activity was acquired at a sampling rate of 500Hz  and reviewed with a high frequency filter of 70Hz  and a low frequency filter of 1Hz . EEG data were recorded continuously and digitally stored. DESCRIPTION: No clear posterior dominant rhythm was seen. EEG showed continuous generalized 3-5hz  theta-delta slowing as well as intermittent rhythmic generalized 2-3Hz  delta slowing.  Hyperventilation and photic stimulation were not performed. ABNORMALITY - Continuous slow, generalized - Intermittent rhythmic slow, generalized IMPRESSION: This study is suggestive of moderate diffuse encephalopathy, non specific to etiology.  No seizures or epileptiform discharges were seen throughout the recording. Corey Oneill   CT Code Stroke CTA Head W/WO contrast  Result Date: 11/25/2019 CLINICAL DATA:  Neuro deficit, acute, stroke suspected. EXAM: CT ANGIOGRAPHY HEAD AND NECK CT PERFUSION BRAIN TECHNIQUE:  Multidetector CT imaging of the head and neck was performed using the standard protocol during bolus administration of intravenous contrast. Multiplanar CT image reconstructions and MIPs were obtained to evaluate the vascular anatomy. Carotid stenosis measurements (when applicable) are obtained utilizing NASCET criteria, using the distal internal carotid diameter as the denominator. Multiphase CT imaging of the brain was performed following IV bolus contrast injection. Subsequent parametric perfusion maps were  calculated using RAPID software. CONTRAST:  131mL OMNIPAQUE IOHEXOL 350 MG/ML SOLN COMPARISON:  Noncontrast head CT performed earlier the same day, CT angiogram head/neck 11/14/2019 FINDINGS: CTA NECK FINDINGS Aortic arch: Standard aortic branching. Scattered soft and calcified plaque within the visualized aortic arch and proximal major branch vessels of the neck. Right carotid system: CCA widely patent to the bifurcation. Mild atherosclerotic plaque within the carotid bifurcation and proximal ICA. No measurable stenosis of the proximal ICA relative to the more distal vessel. Left carotid system: CCA patent to the bifurcation. Soft and calcified plaque within the carotid bifurcation and proximal ICA. No measurable stenosis of the proximal ICA as compared to the more distal vessel. Vertebral arteries: The left vertebral artery is significantly dominant. The non dominant right vertebral artery is patent throughout the neck without significant stenosis (50% or greater). Mixed plaque results in unchanged moderate/severe ostial stenosis at the origin of the dominant left vertebral artery. Distal to this, the left vertebral artery is patent within the neck without significant stenosis (50% or greater). Skeleton: No acute bony abnormality. Cervical spondylosis with multilevel shallow posterior disc osteophytes, uncovertebral and facet hypertrophy. No high-grade bony spinal canal stenosis. Other neck: No neck mass or cervical lymphadenopathy. Subcentimeter right thyroid lobe nodule, not meeting consensus criteria for ultrasound follow-up. Upper chest: Ill-defined opacity within the posterior aspect of the partially visualized right upper lobe suspicious for pneumonia. Review of the MIP images confirms the above findings CTA HEAD FINDINGS Anterior circulation: The intracranial internal carotid arteries are patent bilaterally with scattered calcified plaque but no more than mild stenosis. The right middle and anterior  cerebral arteries are patent without proximal branch occlusion or high-grade proximal stenosis. The left middle cerebral artery is patent without proximal branch occlusion or high-grade proximal arterial stenosis identified. The left anterior cerebral artery is patent without significant proximal stenosis. Redemonstrated multifocal high-grade stenoses within distal left anterior cerebral artery branches (for instance as seen on series 6, image 58) (series 11, image 23). No intracranial aneurysm is identified. Posterior circulation: The non dominant intracranial right vertebral artery is patent and appears to terminate as the right PICA. The dominant intracranial left vertebral artery is patent without significant stenosis, as is the basilar artery. The bilateral posterior cerebral arteries are patent without significant proximal stenosis. Posterior communicating arteries are poorly delineated and may be hypoplastic or absent bilaterally. Venous sinuses: Within limitations of contrast timing, no convincing thrombus. Anatomic variants: As described Review of the MIP images confirms the above findings CT Brain Perfusion Findings: CBF (<30%) Volume: None Perfusion (Tmax>6.0s) volume: None Mismatch Volume: None Infarction Location: None These results were communicated to Dr. Cheral Marker At 3:33 pmon 1/12/2021by text page via the [facility] messaging system. IMPRESSION: CTA neck: 1. The bilateral common and internal carotid arteries are patent within the neck without significant stenosis. Mild plaque at the carotid bifurcations and within the proximal ICAs. 2. Significantly dominant left vertebral artery with redemonstrated moderate/severe ostial stenosis. The vertebral arteries are otherwise patent within the neck without significant stenosis. 3. Ill-defined airspace opacity within the posterior aspect of the partially  imaged right upper lobe suspicious for pneumonia. CTA head: 1. No intracranial large vessel occlusion or  proximal high-grade arterial stenosis identified. 2. Redemonstrated multifocal high-grade stenoses within distal left anterior cerebral artery branches. 3. Calcified atherosclerosis within the intracranial ICAs, but no more than mild stenosis. CT perfusion head: 1. The perfusion software detects no core infarct. 2. The perfusion software detects no region of critically hypoperfused parenchyma utilizing a Tmax>6 seconds threshold. Electronically Signed   By: Kellie Simmering DO   On: 11/25/2019 15:31   CT Code Stroke CTA Neck W/WO contrast  Result Date: 11/25/2019 CLINICAL DATA:  Neuro deficit, acute, stroke suspected. EXAM: CT ANGIOGRAPHY HEAD AND NECK CT PERFUSION BRAIN TECHNIQUE: Multidetector CT imaging of the head and neck was performed using the standard protocol during bolus administration of intravenous contrast. Multiplanar CT image reconstructions and MIPs were obtained to evaluate the vascular anatomy. Carotid stenosis measurements (when applicable) are obtained utilizing NASCET criteria, using the distal internal carotid diameter as the denominator. Multiphase CT imaging of the brain was performed following IV bolus contrast injection. Subsequent parametric perfusion maps were calculated using RAPID software. CONTRAST:  190mL OMNIPAQUE IOHEXOL 350 MG/ML SOLN COMPARISON:  Noncontrast head CT performed earlier the same day, CT angiogram head/neck 11/14/2019 FINDINGS: CTA NECK FINDINGS Aortic arch: Standard aortic branching. Scattered soft and calcified plaque within the visualized aortic arch and proximal major branch vessels of the neck. Right carotid system: CCA widely patent to the bifurcation. Mild atherosclerotic plaque within the carotid bifurcation and proximal ICA. No measurable stenosis of the proximal ICA relative to the more distal vessel. Left carotid system: CCA patent to the bifurcation. Soft and calcified plaque within the carotid bifurcation and proximal ICA. No measurable stenosis of the  proximal ICA as compared to the more distal vessel. Vertebral arteries: The left vertebral artery is significantly dominant. The non dominant right vertebral artery is patent throughout the neck without significant stenosis (50% or greater). Mixed plaque results in unchanged moderate/severe ostial stenosis at the origin of the dominant left vertebral artery. Distal to this, the left vertebral artery is patent within the neck without significant stenosis (50% or greater). Skeleton: No acute bony abnormality. Cervical spondylosis with multilevel shallow posterior disc osteophytes, uncovertebral and facet hypertrophy. No high-grade bony spinal canal stenosis. Other neck: No neck mass or cervical lymphadenopathy. Subcentimeter right thyroid lobe nodule, not meeting consensus criteria for ultrasound follow-up. Upper chest: Ill-defined opacity within the posterior aspect of the partially visualized right upper lobe suspicious for pneumonia. Review of the MIP images confirms the above findings CTA HEAD FINDINGS Anterior circulation: The intracranial internal carotid arteries are patent bilaterally with scattered calcified plaque but no more than mild stenosis. The right middle and anterior cerebral arteries are patent without proximal branch occlusion or high-grade proximal stenosis. The left middle cerebral artery is patent without proximal branch occlusion or high-grade proximal arterial stenosis identified. The left anterior cerebral artery is patent without significant proximal stenosis. Redemonstrated multifocal high-grade stenoses within distal left anterior cerebral artery branches (for instance as seen on series 6, image 58) (series 11, image 23). No intracranial aneurysm is identified. Posterior circulation: The non dominant intracranial right vertebral artery is patent and appears to terminate as the right PICA. The dominant intracranial left vertebral artery is patent without significant stenosis, as is the  basilar artery. The bilateral posterior cerebral arteries are patent without significant proximal stenosis. Posterior communicating arteries are poorly delineated and may be hypoplastic or absent bilaterally. Venous sinuses: Within limitations  of contrast timing, no convincing thrombus. Anatomic variants: As described Review of the MIP images confirms the above findings CT Brain Perfusion Findings: CBF (<30%) Volume: None Perfusion (Tmax>6.0s) volume: None Mismatch Volume: None Infarction Location: None These results were communicated to Dr. Cheral Marker At 3:33 pmon 1/12/2021by text page via the [facility] messaging system. IMPRESSION: CTA neck: 1. The bilateral common and internal carotid arteries are patent within the neck without significant stenosis. Mild plaque at the carotid bifurcations and within the proximal ICAs. 2. Significantly dominant left vertebral artery with redemonstrated moderate/severe ostial stenosis. The vertebral arteries are otherwise patent within the neck without significant stenosis. 3. Ill-defined airspace opacity within the posterior aspect of the partially imaged right upper lobe suspicious for pneumonia. CTA head: 1. No intracranial large vessel occlusion or proximal high-grade arterial stenosis identified. 2. Redemonstrated multifocal high-grade stenoses within distal left anterior cerebral artery branches. 3. Calcified atherosclerosis within the intracranial ICAs, but no more than mild stenosis. CT perfusion head: 1. The perfusion software detects no core infarct. 2. The perfusion software detects no region of critically hypoperfused parenchyma utilizing a Tmax>6 seconds threshold. Electronically Signed   By: Kellie Simmering DO   On: 11/25/2019 15:31   CT Code Stroke Cerebral Perfusion with contrast  Result Date: 11/25/2019 CLINICAL DATA:  Neuro deficit, acute, stroke suspected. EXAM: CT ANGIOGRAPHY HEAD AND NECK CT PERFUSION BRAIN TECHNIQUE: Multidetector CT imaging of the head and neck was  performed using the standard protocol during bolus administration of intravenous contrast. Multiplanar CT image reconstructions and MIPs were obtained to evaluate the vascular anatomy. Carotid stenosis measurements (when applicable) are obtained utilizing NASCET criteria, using the distal internal carotid diameter as the denominator. Multiphase CT imaging of the brain was performed following IV bolus contrast injection. Subsequent parametric perfusion maps were calculated using RAPID software. CONTRAST:  143mL OMNIPAQUE IOHEXOL 350 MG/ML SOLN COMPARISON:  Noncontrast head CT performed earlier the same day, CT angiogram head/neck 11/14/2019 FINDINGS: CTA NECK FINDINGS Aortic arch: Standard aortic branching. Scattered soft and calcified plaque within the visualized aortic arch and proximal major branch vessels of the neck. Right carotid system: CCA widely patent to the bifurcation. Mild atherosclerotic plaque within the carotid bifurcation and proximal ICA. No measurable stenosis of the proximal ICA relative to the more distal vessel. Left carotid system: CCA patent to the bifurcation. Soft and calcified plaque within the carotid bifurcation and proximal ICA. No measurable stenosis of the proximal ICA as compared to the more distal vessel. Vertebral arteries: The left vertebral artery is significantly dominant. The non dominant right vertebral artery is patent throughout the neck without significant stenosis (50% or greater). Mixed plaque results in unchanged moderate/severe ostial stenosis at the origin of the dominant left vertebral artery. Distal to this, the left vertebral artery is patent within the neck without significant stenosis (50% or greater). Skeleton: No acute bony abnormality. Cervical spondylosis with multilevel shallow posterior disc osteophytes, uncovertebral and facet hypertrophy. No high-grade bony spinal canal stenosis. Other neck: No neck mass or cervical lymphadenopathy. Subcentimeter right  thyroid lobe nodule, not meeting consensus criteria for ultrasound follow-up. Upper chest: Ill-defined opacity within the posterior aspect of the partially visualized right upper lobe suspicious for pneumonia. Review of the MIP images confirms the above findings CTA HEAD FINDINGS Anterior circulation: The intracranial internal carotid arteries are patent bilaterally with scattered calcified plaque but no more than mild stenosis. The right middle and anterior cerebral arteries are patent without proximal branch occlusion or high-grade proximal stenosis. The left  middle cerebral artery is patent without proximal branch occlusion or high-grade proximal arterial stenosis identified. The left anterior cerebral artery is patent without significant proximal stenosis. Redemonstrated multifocal high-grade stenoses within distal left anterior cerebral artery branches (for instance as seen on series 6, image 58) (series 11, image 23). No intracranial aneurysm is identified. Posterior circulation: The non dominant intracranial right vertebral artery is patent and appears to terminate as the right PICA. The dominant intracranial left vertebral artery is patent without significant stenosis, as is the basilar artery. The bilateral posterior cerebral arteries are patent without significant proximal stenosis. Posterior communicating arteries are poorly delineated and may be hypoplastic or absent bilaterally. Venous sinuses: Within limitations of contrast timing, no convincing thrombus. Anatomic variants: As described Review of the MIP images confirms the above findings CT Brain Perfusion Findings: CBF (<30%) Volume: None Perfusion (Tmax>6.0s) volume: None Mismatch Volume: None Infarction Location: None These results were communicated to Dr. Cheral Marker At 3:33 pmon 1/12/2021by text page via the [facility] messaging system. IMPRESSION: CTA neck: 1. The bilateral common and internal carotid arteries are patent within the neck without  significant stenosis. Mild plaque at the carotid bifurcations and within the proximal ICAs. 2. Significantly dominant left vertebral artery with redemonstrated moderate/severe ostial stenosis. The vertebral arteries are otherwise patent within the neck without significant stenosis. 3. Ill-defined airspace opacity within the posterior aspect of the partially imaged right upper lobe suspicious for pneumonia. CTA head: 1. No intracranial large vessel occlusion or proximal high-grade arterial stenosis identified. 2. Redemonstrated multifocal high-grade stenoses within distal left anterior cerebral artery branches. 3. Calcified atherosclerosis within the intracranial ICAs, but no more than mild stenosis. CT perfusion head: 1. The perfusion software detects no core infarct. 2. The perfusion software detects no region of critically hypoperfused parenchyma utilizing a Tmax>6 seconds threshold. Electronically Signed   By: Kellie Simmering DO   On: 11/25/2019 15:31   CT HEAD CODE STROKE WO CONTRAST  Result Date: 11/25/2019 CLINICAL DATA:  Code stroke.  Aphasia.  Right-sided weakness. EXAM: CT HEAD WITHOUT CONTRAST TECHNIQUE: Contiguous axial images were obtained from the base of the skull through the vertex without intravenous contrast. COMPARISON:  MRI 11/14/2019 FINDINGS: Brain: Generalized atrophy. Chronic small-vessel ischemic changes throughout the brain as seen previously. No CT evidence of acute infarction, mass lesion, hemorrhage, hydrocephalus or extra-axial collection. Vascular: There is atherosclerotic calcification of the major vessels at the base of the brain. Skull: Normal Sinuses/Orbits: Clear/normal Other: None ASPECTS ([place] Stroke Program Early CT Score) - Ganglionic level infarction (caudate, lentiform nuclei, internal capsule, insula, M1-M3 cortex): 7 - Supraganglionic infarction (M4-M6 cortex): 3 Total score (0-10 with 10 being normal): 10 IMPRESSION: 1. No acute finding by CT. Atrophy and chronic  small-vessel ischemic changes as seen previously. 2. ASPECTS is 10 3. These results were communicated to Dr. Cheral Marker at 2:29 pmon 1/12/2021by text page via the [facility] messaging system. Electronically Signed   By: Nelson Chimes M.D.   On: 11/25/2019 14:30    Scheduled Meds: .  stroke: mapping our early stages of recovery book   Does not apply Once  . amoxicillin  500 mg Oral BID  . aspirin EC  81 mg Oral QPM  . carvedilol  3.125 mg Oral BID  . doxycycline  100 mg Oral BID  . DULoxetine  30 mg Oral Daily  . enzalutamide  160 mg Oral Q supper  . folic acid  1 mg Oral Once per day on Sun Mon Wed Thu Fri Sat  .  isosorbide mononitrate  30 mg Oral Daily  . rosuvastatin  10 mg Oral Daily   Continuous Infusions: . 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

## 2019-11-26 NOTE — Progress Notes (Signed)
vLTM EEG started. No skin breakdown. Educated family member on vent button. Notified neuro

## 2019-11-26 NOTE — TOC Transition Note (Signed)
Transition of Care [facility]) - CM/SW Discharge Note   Patient Details  Name: FINNLEE KUHNLE MRN: [id number] Date of Birth: 13-Apr-1936  [facility]) CM/SW Contact:  Pollie Friar, RN Phone Number: 11/26/2019, 12:37 PM   Clinical Narrative:    Patient is from home with care givers through [place]. Daughter at the bedside and will have them arranged to be at the home when he is d/ced.  Pt active with [name] for [facility] services. Cory with [name] made aware of patients admission. Will need resumption orders with PT recommendations. Pt has transportation home when medically ready.  TOC following for further d/c needs.      Barriers to Discharge: Continued Medical Work up   Patient Goals and CMS Choice   CMS Medicare.gov Compare Post Acute Care list provided to:: Patient Represenative (must comment) Choice offered to / list presented to : Patient, Adult Children  Discharge Placement                       Discharge Plan and Services   Discharge Planning Services: CM Consult Post Acute Care Choice: [place]: [facility]     Representative spoke with at [place]: [place] ([place]) Interventions     Readmission Risk Interventions No flowsheet data found.

## 2019-11-26 NOTE — Evaluation (Signed)
Occupational Therapy Evaluation Patient Details Name: Corey Oneill MRN: [id number] DOB: [birth year] Today's Date: 11/26/2019    History of Present Illness Corey Oneill is a 84 y.o. male with medical history significant of hearing loss; metastatic prostate CA with resultant radiation proctitis; chronic ITP; s/p TAVR with recurrent enterococcal bacteremia; grade 1 diastolic dysfunction; HLD; CAD presenting with aphasia and R-sided weakness.CT head, CTA head and neck as well as CT perfusion did not reveal any abnormalities. Evaluated by neuro who thought possible partial complex seizure as he has hx of same. LTM EEG ordered.   Clinical Impression   PTA pt living at home with wife, receiving assist from palliative caregivers. At time of eval, he is min guard for bed mobility and transfers, and min A for functional mobility. Pt presents with cognitive deficits in orientation, safety, sequencing, and problem solving which impact safe BADL without supervision or assistance. Pt soiled with BM upon arrival, requiring total A for peri care while pt standing and steadying self with RW. He was able to complete functional mobility with min A for safety and sequencing cues to chair for breakfast. Pt needing EEG, and returned back to bed for procedure. Given current status, recommend HHOT at d/c for safe progression of BADL in home environment. Will continue to follow per POC listed below.     Follow Up Recommendations  Home health OT;Supervision/Assistance - 24 hour    Equipment Recommendations  None recommended by OT    Recommendations for Other Services       Precautions / Restrictions Precautions Precautions: Fall Restrictions Weight Bearing Restrictions: No      Mobility Bed Mobility Overal bed mobility: Needs Assistance Bed Mobility: Supine to Sit     Supine to sit: Min guard     General bed mobility comments: Increased time, use of bed rails  Transfers Overall transfer level: Needs  assistance Equipment used: Rolling walker (2 wheeled);None Transfers: Sit to/from Stand Sit to Stand: Min guard         General transfer comment: Pushing off from walker, with initial mild posterior lean requiring min guard assist to stabilize    Balance Overall balance assessment: Needs assistance Sitting-balance support: Feet supported Sitting balance-Leahy Scale: Good     Standing balance support: Bilateral upper extremity supported;Single extremity supported Standing balance-Leahy Scale: Poor                             ADL either performed or assessed with clinical judgement   ADL Overall ADL's : Needs assistance/impaired Eating/Feeding: Set up;Sitting   Grooming: Min guard;Minimal assistance;Standing   Upper Body Bathing: Minimal assistance;Sitting   Lower Body Bathing: Moderate assistance;Sit to/from stand   Upper Body Dressing : Minimal assistance;Sitting   Lower Body Dressing: Moderate assistance;Sit to/from stand;Sitting/lateral leans Lower Body Dressing Details (indicate cue type and reason): to don socks Toilet Transfer: Minimal assistance;Comfort height toilet;Ambulation;RW;Grab bars   Toileting- Clothing Manipulation and Hygiene: Total assistance;Sit to/from stand Toileting - Clothing Manipulation Details (indicate cue type and reason): pt incontinent of bowels during session, requiring total A for peri care to clean Tub/ Shower Transfer: Minimal assistance;Ambulation;Shower seat;Rolling walker   Functional mobility during ADLs: Minimal assistance;Rolling walker;Cueing for safety;Cueing for sequencing General ADL Comments: pt limited by cognitive deficits, decreased activity tolerance, and weakness for BADL     Vision Baseline Vision/History: Wears glasses Wears Glasses: At all times Patient Visual Report: No change from baseline  Perception     Praxis      Pertinent Vitals/Pain Pain Assessment: No/denies pain     Hand  Dominance Right   Extremity/Trunk Assessment Upper Extremity Assessment Upper Extremity Assessment: Generalized weakness   Lower Extremity Assessment Lower Extremity Assessment: Generalized weakness   Cervical / Trunk Assessment Cervical / Trunk Assessment: Kyphotic   Communication Communication Communication: No difficulties   Cognition Arousal/Alertness: Awake/alert Behavior During Therapy: WFL for tasks assessed/performed Overall Cognitive Status: Impaired/Different from baseline Area of Impairment: Memory;Safety/judgement;Problem solving                     Memory: Decreased short-term memory;Decreased recall of precautions   Safety/Judgement: Decreased awareness of safety;Decreased awareness of deficits   Problem Solving: Difficulty sequencing;Requires verbal cues General Comments: Pt is pleasantly confused. Is oriented he is in the hospital, but not to situation, stating he "needs his clothes because his daughter is about to come get him." Needs cues for problem solving; i.e. that he needs to take his medications prior to calling his wife.   General Comments       Exercises     Shoulder Instructions      Home Living Family/patient expects to be discharged to:: Private residence Living Arrangements: Spouse/significant other Available Help at Discharge: Family;Available 24 hours/day Type of Home: Other(Comment)(townhouse) Home Access: Stairs to enter Entrance Stairs-Number of Steps: 2 Entrance Stairs-Rails: Right Home Layout: One level     Bathroom Shower/Tub: Teacher, early years/pre: Handicapped height     Home Equipment: Environmental consultant - 2 wheels;Cane - single point          Prior Functioning/Environment Level of Independence: Needs assistance  Gait / Transfers Assistance Needed: uses walker ADL's / Homemaking Assistance Needed: pt states he is independent ADL's; dependent IADL's            OT Problem List: Decreased strength;Decreased  knowledge of use of DME or AE;Decreased activity tolerance;Decreased cognition;Impaired balance (sitting and/or standing);Decreased safety awareness      OT Treatment/Interventions: Self-care/ADL training;Therapeutic exercise;Patient/family education;Balance training;Energy conservation;Therapeutic activities;DME and/or AE instruction;Cognitive remediation/compensation    OT Goals(Current goals can be found in the care plan section) Acute Rehab OT Goals Patient Stated Goal: Goal of pt family per chart review is to return home OT Goal Formulation: With patient/family Time For Goal Achievement: 12/10/19 Potential to Achieve Goals: Good  OT Frequency: Min 2X/week   Barriers to D/C:            Co-evaluation PT/OT/SLP Co-Evaluation/Treatment: Yes Reason for Co-Treatment: Necessary to address cognition/behavior during functional activity;For patient/therapist safety PT goals addressed during session: Mobility/safety with mobility OT goals addressed during session: ADL's and self-care;Strengthening/ROM      AM-PAC OT "6 Clicks" Daily Activity     Outcome Measure Help from another person eating meals?: None Help from another person taking care of personal grooming?: A Little Help from another person toileting, which includes using toliet, bedpan, or urinal?: A Lot Help from another person bathing (including washing, rinsing, drying)?: A Lot Help from another person to put on and taking off regular upper body clothing?: A Little Help from another person to put on and taking off regular lower body clothing?: A Lot 6 Click Score: 16   End of Session Equipment Utilized During Treatment: Gait belt;Rolling walker Nurse Communication: Mobility status  Activity Tolerance: Patient tolerated treatment well Patient left: in bed;with call bell/phone within reach;with bed alarm set  OT Visit Diagnosis: Unsteadiness on feet (R26.81);Other abnormalities  of gait and mobility (R26.89);Muscle weakness  (generalized) (M62.81);Other symptoms and signs involving cognitive function                Time: [id number] OT Time Calculation (min): 32 min Charges:  OT General Charges $OT Visit: 1 Visit OT Evaluation $OT Eval Moderate Complexity: 1 Mod  Zenovia Jarred, MSOT, OTR/L Acute Rehabilitation Services [facility] Office Number: [phone]  Zenovia Jarred 11/26/2019, 1:17 PM

## 2019-11-26 NOTE — Progress Notes (Signed)
NEUROLOGY PROGRESS NOTE   Subjective: Patient has no complaints.  Exam: Vitals:   11/26/19 0415 11/26/19 0956  BP: (!) 180/106 110/63  Pulse: 92 72  Resp: 17 17  Temp: 98 F (36.7 C) 97.8 F (36.6 C)  SpO2: 98% 99%    Physical Exam  Constitutional: Appears well-developed and well-nourished.  Psych: Affect appropriate to situation Eyes: No scleral injection HENT: No OP obstrucion Head: Normocephalic.  Cardiovascular: Normal rate and regular rhythm.  Respiratory: Effort normal, non-labored breathing GI: Soft.  No distension. There is no tenderness.  Skin: WDI   Neuro:  Mental Status: Alert, oriented, thought content appropriate.  Speech fluent without evidence of aphasia.  Able to follow 2 step commands without difficulty. Cranial Nerves: II:  Visual fields grossly normal, PERRL.  III,IV, VI: ptosis not present, EOMI V,VII: smile symmetric, facial light touch sensation normal bilaterally VIII: hearing intact to voice XII: midline tongue extension Motor: Right : Upper extremity   5/5    Left:     Upper extremity   5/5  Lower extremity   4/5     Lower extremity   4/5 Tone and bulk:normal tone throughout; no atrophy noted Sensory: Pinprick and light touch intact throughout, bilaterally    Medications:  Scheduled: .  stroke: mapping our early stages of recovery book   Does not apply Once  . amoxicillin  500 mg Oral BID  . aspirin EC  81 mg Oral QPM  . carvedilol  3.125 mg Oral BID  . doxycycline  100 mg Oral BID  . DULoxetine  30 mg Oral Daily  . enzalutamide  160 mg Oral Q supper  . folic acid  1 mg Oral Once per day on Sun Mon Wed Thu Fri Sat  . isosorbide mononitrate  30 mg Oral Daily  . rosuvastatin  10 mg Oral Daily   Continuous: . sodium chloride      Pertinent Labs/Diagnostics: No significant abnormal labs  EEG Result Date: 11/25/2019  IMPRESSION: This study is suggestive of moderate diffuse encephalopathy, non specific to etiology.  No seizures or  epileptiform discharges were seen throughout the recording. Lora Havens   CT Code Stroke Cerebral Perfusion with contrast Result Date: 11/25/2019  IMPRESSION: CTA neck: 1. The bilateral common and internal carotid arteries are patent within the neck without significant stenosis. Mild plaque at the carotid bifurcations and within the proximal ICAs. 2. Significantly dominant left vertebral artery with redemonstrated moderate/severe ostial stenosis. The vertebral arteries are otherwise patent within the neck without significant stenosis. 3. Ill-defined airspace opacity within the posterior aspect of the partially imaged right upper lobe suspicious for pneumonia. CTA head: 1. No intracranial large vessel occlusion or proximal high-grade arterial stenosis identified. 2. Redemonstrated multifocal high-grade stenoses within distal left anterior cerebral artery branches. 3. Calcified atherosclerosis within the intracranial ICAs, but no more than mild stenosis. CT perfusion head: 1. The perfusion software detects no core infarct. 2. The perfusion software detects no region of critically hypoperfused parenchyma utilizing a Tmax>6 seconds threshold. Electronically Signed   By: Kellie Simmering DO   On: 11/25/2019 15:31   CT HEAD CODE STROKE WO CONTRAST Result Date: 11/25/2019  IMPRESSION: 1. No acute finding by CT. Atrophy and chronic small-vessel ischemic changes as seen previously. 2. ASPECTS is 10 3. These results were communicated to Dr. Cheral Marker at 2:29 pmon 1/12/2021by text page via the [facility] messaging system. Electronically Signed   By: Nelson Chimes M.D.   On: 11/25/2019 14:30  Etta Quill PA-C [facility] [phone]  Assessment: 84 year old male presenting after acute onset of aphasia, right facial droop and right-sided weakness at home. Stroke work-up with CT head, CTA head and neck along with CT perfusion did not reveal any abnormalities.  At this point the most likely etiology for his  presentation is recurrent partial complex seizures as he has had one episode prior with similar symptoms. 1. EEG: This study is suggestive of moderate diffuse encephalopathy, non specific to etiology.  No seizures or epileptiform discharges were seen throughout the recording.  2. Neurological exam today is unremarkable. The patient is back to his baseline.   Recommendations: -LTM EEG -Continue cardiac telemetry monitoring -Continue frequent neurochecks   Electronically signed: Dr. Kerney Elbe 11/26/2019, 10:20 AM

## 2019-11-26 NOTE — Evaluation (Signed)
Physical Therapy Evaluation Patient Details Name: Corey Oneill MRN: [id number] DOB: 06-13-1936 Today's Date: 11/26/2019   History of Present Illness  Corey Oneill is a 84 y.o. male with medical history significant of hearing loss; metastatic prostate CA with resultant radiation proctitis; chronic ITP; s/p TAVR with recurrent enterococcal bacteremia; grade 1 diastolic dysfunction; HLD; CAD presenting with aphasia and R-sided weakness.CT head, CTA head and neck as well as CT perfusion did not reveal any abnormalities. Evaluated by neuro who thought possible partial complex seizure as he has hx of same. LTM EEG ordered.  Clinical Impression  Pt admitted with above. Pt seems to be close to baseline with functional mobility and cognition; performing transfers and ambulating in room with walker at a min guard assist level. Received with bowel incontinence and requiring totalA for peri care/gown change. Presents with balance impairments and cognitive deficits including decreased safety awareness. Presents as a high fall risk, therefore, would benefit from HHPT to maximize functional independence.     Follow Up Recommendations Home health PT;Supervision/Assistance - 24 hour    Equipment Recommendations  None recommended by PT    Recommendations for Other Services       Precautions / Restrictions Precautions Precautions: Fall Restrictions Weight Bearing Restrictions: No      Mobility  Bed Mobility Overal bed mobility: Needs Assistance Bed Mobility: Supine to Sit     Supine to sit: Min guard     General bed mobility comments: Increased time, use of bed rails  Transfers Overall transfer level: Needs assistance Equipment used: Rolling walker (2 wheeled);None Transfers: Sit to/from Stand Sit to Stand: Min guard         General transfer comment: Pushing off from walker, with initial mild posterior lean requiring min guard assist to stabilize  Ambulation/Gait Ambulation/Gait  assistance: Min guard;Min assist Gait Distance (Feet): 15 Feet Assistive device: Rolling walker (2 wheeled) Gait Pattern/deviations: Step-through pattern;Decreased stride length     General Gait Details: Pt requiring mostly min guard for stabiility; one episdose of mild posterior LOB requiring minA to correct back to upright  Stairs            Wheelchair Mobility    Modified Rankin (Stroke Patients Only) Modified Rankin (Stroke Patients Only) Pre-Morbid Rankin Score: Moderate disability Modified Rankin: Moderately severe disability     Balance Overall balance assessment: Needs assistance Sitting-balance support: Feet supported Sitting balance-Leahy Scale: Good     Standing balance support: Bilateral upper extremity supported;Single extremity supported Standing balance-Leahy Scale: Poor                               Pertinent Vitals/Pain Pain Assessment: No/denies pain    Home Living Family/patient expects to be discharged to:: Private residence Living Arrangements: Spouse/significant other Available Help at Discharge: Family;Available 24 hours/day Type of Home: Other(Comment)(townhouse) Home Access: Stairs to enter Entrance Stairs-Rails: Right Entrance Stairs-Number of Steps: 2 Home Layout: One level Home Equipment: Walker - 2 wheels;Cane - single point      Prior Function Level of Independence: Needs assistance   Gait / Transfers Assistance Needed: uses walker  ADL's / Homemaking Assistance Needed: pt states he is independent ADL's; dependent IADL's        Hand Dominance   Dominant Hand: Right    Extremity/Trunk Assessment   Upper Extremity Assessment Upper Extremity Assessment: Generalized weakness    Lower Extremity Assessment Lower Extremity Assessment: Generalized weakness    Cervical /  Trunk Assessment Cervical / Trunk Assessment: Kyphotic  Communication   Communication: No difficulties  Cognition Arousal/Alertness:  Awake/alert Behavior During Therapy: WFL for tasks assessed/performed Overall Cognitive Status: Impaired/Different from baseline Area of Impairment: Memory;Safety/judgement;Problem solving                     Memory: Decreased short-term memory;Decreased recall of precautions   Safety/Judgement: Decreased awareness of safety;Decreased awareness of deficits   Problem Solving: Difficulty sequencing;Requires verbal cues General Comments: Pt is pleasantly confused. Is oriented he is in the hospital, but not to situation, stating he "needs his clothes because his daughter is about to come get him." Needs cues for problem solving; i.e. that he needs to take his medications prior to calling his wife.      General Comments      Exercises     Assessment/Plan    PT Assessment Patient needs continued PT services  PT Problem List Decreased balance;Decreased mobility;Decreased coordination;Decreased safety awareness;Decreased knowledge of precautions       PT Treatment Interventions DME instruction;Gait training;Stair training;Functional mobility training;Therapeutic activities;Therapeutic exercise;Balance training;Neuromuscular re-education;Patient/family education    PT Goals (Current goals can be found in the Care Plan section)  Acute Rehab PT Goals Patient Stated Goal: Goal of pt family per chart review is to return home PT Goal Formulation: With patient Time For Goal Achievement: 12/10/19 Potential to Achieve Goals: Good    Frequency Min 3X/week   Barriers to discharge        Co-evaluation PT/OT/SLP Co-Evaluation/Treatment: Yes Reason for Co-Treatment: To address functional/ADL transfers PT goals addressed during session: Mobility/safety with mobility         AM-PAC PT "6 Clicks" Mobility  Outcome Measure Help needed turning from your back to your side while in a flat bed without using bedrails?: None Help needed moving from lying on your back to sitting on the  side of a flat bed without using bedrails?: A Little Help needed moving to and from a bed to a chair (including a wheelchair)?: A Little Help needed standing up from a chair using your arms (e.g., wheelchair or bedside chair)?: A Little Help needed to walk in hospital room?: A Little Help needed climbing 3-5 steps with a railing? : A Lot 6 Click Score: 18    End of Session Equipment Utilized During Treatment: Gait belt Activity Tolerance: Patient tolerated treatment well Patient left: in bed;with call bell/phone within reach;with bed alarm set Nurse Communication: Mobility status PT Visit Diagnosis: Other abnormalities of gait and mobility (R26.89)    Time: [id number] PT Time Calculation (min) (ACUTE ONLY): 32 min   Charges:   PT Evaluation $PT Eval Moderate Complexity: 1 Mod          Ellamae Sia, PT, DPT Acute Rehabilitation Services Pager [phone] Office [phone]   Willy Eddy 11/26/2019, 11:49 AM

## 2019-11-27 DIAGNOSIS — R410 Disorientation, unspecified: Secondary | ICD-10-CM

## 2019-11-27 DIAGNOSIS — I10 Essential (primary) hypertension: Secondary | ICD-10-CM

## 2019-11-27 DIAGNOSIS — L120 Bullous pemphigoid: Secondary | ICD-10-CM

## 2019-11-27 DIAGNOSIS — Z862 Personal history of diseases of the blood and blood-forming organs and certain disorders involving the immune mechanism: Secondary | ICD-10-CM

## 2019-11-27 DIAGNOSIS — C61 Malignant neoplasm of prostate: Secondary | ICD-10-CM

## 2019-11-27 DIAGNOSIS — C799 Secondary malignant neoplasm of unspecified site: Secondary | ICD-10-CM

## 2019-11-27 DIAGNOSIS — R569 Unspecified convulsions: Secondary | ICD-10-CM

## 2019-11-27 DIAGNOSIS — Z952 Presence of prosthetic heart valve: Secondary | ICD-10-CM

## 2019-11-27 LAB — CBC
HCT: 31.9 % — ABNORMAL LOW (ref 39.0–52.0)
Hemoglobin: 10.9 g/dL — ABNORMAL LOW (ref 13.0–17.0)
MCH: 32.5 pg (ref 26.0–34.0)
MCHC: 34.2 g/dL (ref 30.0–36.0)
MCV: 95.2 fL (ref 80.0–100.0)
Platelets: 59 10*3/uL — ABNORMAL LOW (ref 150–400)
RBC: 3.35 MIL/uL — ABNORMAL LOW (ref 4.22–5.81)
RDW: 14.3 % (ref 11.5–15.5)
WBC: 3.7 10*3/uL — ABNORMAL LOW (ref 4.0–10.5)
nRBC: 0 % (ref 0.0–0.2)

## 2019-11-27 MED ORDER — SODIUM CHLORIDE 0.9% FLUSH: 3.0000 mL | INTRAVENOUS | Status: DC | PRN

## 2019-11-27 MED ORDER — SODIUM CHLORIDE 0.9% FLUSH
3.0000 mL | Freq: Two times a day (BID) | INTRAVENOUS | Status: DC
  Administered 2019-11-27 – 2019-11-28 (×4): 3 mL via INTRAVENOUS

## 2019-11-27 MED ORDER — SODIUM CHLORIDE 0.9 % IV SOLN: 250.0000 mL | INTRAVENOUS | Status: DC | PRN

## 2019-11-27 NOTE — Progress Notes (Signed)
Patient ID: Corey Oneill, male   DOB: [birth year], 84 y.o.   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  Neurology was consulted.  CT head was negative for acute abnormality.  CTA head and neck as well as CT perfusion did not reveal any abnormalities.  There was a concern for seizure as well.  EEG was negative.  LTM EEG was ordered by neurology.  Assessment & Plan:   Acute onset of aphasia, right-sided facial droop and right-sided weakness -Stroke work-up with CT head, CTA head and neck and CT perfusion are negative for any acute abnormalities. -Neurology following.  There was a concern for partial complex seizures.  EEG was negative for seizure activities.  -Mental status is improving. -PT following. -Follow LTM results and neurology recommendations  Hypertension -Continue Coreg and Imdur.  Monitor  Hyperlipidemia -Continue Crestor  Metastatic prostate cancer -Outpatient follow-up with oncology/Dr. Alen Blew  Status post TAVR History of enterococcal bacteremia -Cultures negative so far. -Recent coronary CTA was negative for apparent vegetations.  TEE could not be performed during prior hospitalization because of large hiatal hernia. -Outpatient follow-up with cardiology  Chronic ITP -Unable to add/change antiplatelet agent due to chronically low platelets -Outpatient follow-up with Dr. Alen Blew -No current signs of bleeding  Bullous pemphigoid -Outpatient follow-up with dermatology.  Continue methotrexate, folic acid and doxycycline.    DVT prophylaxis: SCDs Code Status:  DNR Family Communication: Spoke to patient at bedside Disposition Plan: Home versus rehab depending on further neurology recommendations and PT evaluation  Consultants: Neurology  Procedures: None  Antimicrobials: None   Subjective: Patient seen and examined at bedside.  Denies any overnight seizures.  Poor historian.  No overnight fever or vomiting reported.   Objective: Vitals:   11/27/19 0530 11/27/19 0844 11/27/19 1000 11/27/19 1223  BP: (!) 154/72 (!) 109/40 115/60 (!) 94/54  Pulse: 66 63  72  Resp: 18 16 18 16   Temp: 97.6 F (36.4 C) (!) 97.4 F (36.3 C)    TempSrc: Oral Oral    SpO2: 99% 99%  99%    Intake/Output Summary (Last 24 hours) at 11/27/2019 1320 Last data filed at 11/27/2019 1000 Gross per 24 hour  Intake 170 ml  Output 550 ml  Net -380 ml   There were no vitals filed for this visit.  Examination:  General exam: Appears calm and comfortable.  Elderly male lying in bed.  Awake, answers some questions but poor historian.  Has LTM EEG leads on his head Respiratory system: Bilateral decreased breath sounds at bases Cardiovascular system: S1 & S2 heard, Rate controlled Gastrointestinal system: Abdomen is nondistended, soft and nontender. Normal bowel sounds heard. Extremities: No cyanosis, clubbing, edema    Data Reviewed: I have personally reviewed following labs and imaging studies  CBC: Recent Labs  Lab 11/25/19 1413 11/25/19 1417 11/27/19 0333  WBC 4.4  --  3.7*  NEUTROABS 2.6  --   --   HGB 10.8* 10.5* 10.9*  HCT 32.3* 31.0* 31.9*  MCV 99.1  --  95.2  PLT 60*  --  59*   Basic Metabolic Panel: Recent Labs  Lab 11/25/19 1413 11/25/19 1417  NA 134*  134*  K 3.9 4.0  CL 100 100  CO2 24  --   GLUCOSE 100* 99  BUN 18 20  CREATININE 0.84 0.70  CALCIUM 8.7*  --    GFR: Estimated Creatinine Clearance: 64.6 mL/min (by C-G formula based on SCr of 0.7 mg/dL). Liver Function Tests: Recent Labs  Lab 11/25/19 1413  AST 21  ALT 9  ALKPHOS  71  BILITOT 0.3  PROT 6.4*  ALBUMIN 3.1*   No results for input(s): LIPASE, AMYLASE in the last 168 hours. No results for input(s): AMMONIA in the last 168 hours. Coagulation Profile: Recent Labs  Lab 11/25/19 1413  INR 1.2   Cardiac Enzymes: No results for input(s): CKTOTAL, CKMB, CKMBINDEX, TROPONINI in the last 168 hours. BNP (last 3 results) No results for input(s): PROBNP in the last 8760 hours. HbA1C: No results for input(s): HGBA1C in the last 72 hours. CBG: Recent Labs  Lab 11/25/19 1413  GLUCAP 94   Lipid Profile: No results for input(s): CHOL, HDL, LDLCALC, TRIG, CHOLHDL, LDLDIRECT in the last 72 hours. Thyroid Function Tests: No results for input(s): TSH, T4TOTAL, FREET4, T3FREE, THYROIDAB in the last 72 hours. Anemia Panel: No results for input(s): VITAMINB12, FOLATE, FERRITIN, TIBC, IRON, RETICCTPCT in the last 72 hours. Sepsis Labs: No results for input(s): PROCALCITON, LATICACIDVEN in the last 168 hours.  Recent Results (from the past 240 hour(s))  Respiratory Panel by RT PCR (Flu A&B, Covid) - Nasopharyngeal Swab     Status: None   Collection Time: 11/25/19  4:31 PM   Specimen: Nasopharyngeal Swab  Result Value Ref Range Status   SARS Coronavirus 2 by RT PCR NEGATIVE NEGATIVE Final    Comment: (NOTE) SARS-CoV-2 target nucleic acids are NOT DETECTED. The SARS-CoV-2 RNA is generally detectable in upper respiratoy specimens during the acute phase of infection. The lowest concentration of SARS-CoV-2 viral copies this assay can detect is 131 copies/mL. A negative result does not preclude SARS-Cov-2 infection and should not be used as the sole basis for treatment or other patient management decisions. A negative result may occur with  improper specimen collection/handling, submission of specimen other than nasopharyngeal swab, presence of viral mutation(s) within the areas targeted by this assay, and inadequate number of viral copies (<131 copies/mL). A  negative result must be combined with clinical observations, patient history, and epidemiological information. The expected result is Negative. Fact Sheet for Patients:  PinkCheek.be Fact Sheet for Healthcare Providers:  GravelBags.it This test is not yet ap proved or cleared by the [place] FDA and  has been authorized for detection and/or diagnosis of SARS-CoV-2 by FDA under an Emergency Use Authorization (EUA). This EUA will remain  in effect (meaning this test can be used) for the duration of the COVID-19 declaration under Section 564(b)(1) of the Act, 21 U.S.C. section 360bbb-3(b)(1), unless the authorization is terminated or revoked sooner.    Influenza A by PCR NEGATIVE NEGATIVE Final   Influenza B by PCR NEGATIVE NEGATIVE Final    Comment: (NOTE) The Xpert Xpress SARS-CoV-2/FLU/RSV assay is intended as an aid in  the diagnosis of influenza from Nasopharyngeal swab specimens and  should not be used as a sole basis for treatment. Nasal washings and  aspirates are unacceptable for Xpert Xpress SARS-CoV-2/FLU/RSV  testing. Fact Sheet for Patients: PinkCheek.be Fact Sheet for Healthcare Providers: GravelBags.it This test is not yet approved or cleared by the [place] FDA and  has been authorized for detection and/or diagnosis of SARS-CoV-2 by  FDA under  an Emergency Use Authorization (EUA). This EUA will remain  in effect (meaning this test can be used) for the duration of the  Covid-19 declaration under Section 564(b)(1) of the Act, 21  U.S.C. section 360bbb-3(b)(1), unless the authorization is  terminated or revoked. Performed at [facility], [place] [address] [place], [place] [address]   Culture, blood (routine x 2)     Status: None (Preliminary result)   Collection Time: 11/26/19 12:50 AM   Specimen: BLOOD  Result Value Ref Range Status    Specimen Description BLOOD RIGHT ANTECUBITAL  Final   Special Requests   Final    BOTTLES DRAWN AEROBIC AND ANAEROBIC Blood Culture adequate volume   Culture   Final    NO GROWTH 1 DAY Performed at [facility], [place] [address] [place], [place] [address]    Report Status PENDING  Incomplete  Culture, blood (routine x 2)     Status: None (Preliminary result)   Collection Time: 11/26/19 12:59 AM   Specimen: BLOOD  Result Value Ref Range Status   Specimen Description BLOOD LEFT ANTECUBITAL  Final   Special Requests   Final    BOTTLES DRAWN AEROBIC AND ANAEROBIC Blood Culture adequate volume   Culture   Final    NO GROWTH 1 DAY Performed at [facility], [place] [address], [place], [place] [address]    Report Status PENDING  Incomplete         Radiology Studies: EEG  Result Date: 11/25/2019 Lora Havens, MD     11/25/2019  5:42 PM Patient Name: RIVERS HILDEBRAN MRN: [id number] Epilepsy Attending: Lora Havens Referring Physician/Provider: Dr Kerney Elbe Date: 11/25/2019 Duration: 23.41 mins Patient history: 84 y.o. male with h/o "staring spells" presenting after acute onset of aphasia, right facial droop and right sided weakness at home. EEG to evaluate for seizure. Level of alertness: awake AEDs during EEG study: None Technical aspects: This EEG study was done with scalp electrodes positioned according to the 10-20 International system of electrode placement. Electrical activity was acquired at a sampling rate of 500Hz  and reviewed with a high frequency filter of 70Hz  and a low frequency filter of 1Hz . EEG data were recorded continuously and digitally stored. DESCRIPTION: No clear posterior dominant rhythm was seen. EEG showed continuous generalized 3-5hz  theta-delta slowing as well as intermittent rhythmic generalized 2-3Hz  delta slowing.  Hyperventilation and photic stimulation were not performed. ABNORMALITY - Continuous slow, generalized - Intermittent rhythmic slow,  generalized IMPRESSION: This study is suggestive of moderate diffuse encephalopathy, non specific to etiology.  No seizures or epileptiform discharges were seen throughout the recording. Lora Havens   CT Code Stroke CTA Head W/WO contrast  Result Date: 11/25/2019 CLINICAL DATA:  Neuro deficit, acute, stroke suspected. EXAM: CT ANGIOGRAPHY HEAD AND NECK CT PERFUSION BRAIN TECHNIQUE: Multidetector CT imaging of the head and neck was performed using the standard protocol during bolus administration of intravenous contrast. Multiplanar CT image reconstructions and MIPs were obtained to evaluate the vascular anatomy. Carotid stenosis measurements (when applicable) are obtained utilizing NASCET criteria, using the distal internal carotid diameter as the denominator. Multiphase CT imaging of the brain was performed following IV bolus contrast injection. Subsequent parametric perfusion maps were calculated using RAPID software. CONTRAST:  11mL OMNIPAQUE IOHEXOL 350 MG/ML SOLN COMPARISON:  Noncontrast head CT performed earlier the same day, CT angiogram head/neck 11/14/2019 FINDINGS: CTA NECK FINDINGS Aortic arch: Standard aortic branching. Scattered soft and calcified plaque within the  visualized aortic arch and proximal major branch vessels of the neck. Right carotid system: CCA widely patent to the bifurcation. Mild atherosclerotic plaque within the carotid bifurcation and proximal ICA. No measurable stenosis of the proximal ICA relative to the more distal vessel. Left carotid system: CCA patent to the bifurcation. Soft and calcified plaque within the carotid bifurcation and proximal ICA. No measurable stenosis of the proximal ICA as compared to the more distal vessel. Vertebral arteries: The left vertebral artery is significantly dominant. The non dominant right vertebral artery is patent throughout the neck without significant stenosis (50% or greater). Mixed plaque results in unchanged moderate/severe ostial  stenosis at the origin of the dominant left vertebral artery. Distal to this, the left vertebral artery is patent within the neck without significant stenosis (50% or greater). Skeleton: No acute bony abnormality. Cervical spondylosis with multilevel shallow posterior disc osteophytes, uncovertebral and facet hypertrophy. No high-grade bony spinal canal stenosis. Other neck: No neck mass or cervical lymphadenopathy. Subcentimeter right thyroid lobe nodule, not meeting consensus criteria for ultrasound follow-up. Upper chest: Ill-defined opacity within the posterior aspect of the partially visualized right upper lobe suspicious for pneumonia. Review of the MIP images confirms the above findings CTA HEAD FINDINGS Anterior circulation: The intracranial internal carotid arteries are patent bilaterally with scattered calcified plaque but no more than mild stenosis. The right middle and anterior cerebral arteries are patent without proximal branch occlusion or high-grade proximal stenosis. The left middle cerebral artery is patent without proximal branch occlusion or high-grade proximal arterial stenosis identified. The left anterior cerebral artery is patent without significant proximal stenosis. Redemonstrated multifocal high-grade stenoses within distal left anterior cerebral artery branches (for instance as seen on series 6, image 58) (series 11, image 23). No intracranial aneurysm is identified. Posterior circulation: The non dominant intracranial right vertebral artery is patent and appears to terminate as the right PICA. The dominant intracranial left vertebral artery is patent without significant stenosis, as is the basilar artery. The bilateral posterior cerebral arteries are patent without significant proximal stenosis. Posterior communicating arteries are poorly delineated and may be hypoplastic or absent bilaterally. Venous sinuses: Within limitations of contrast timing, no convincing thrombus. Anatomic  variants: As described Review of the MIP images confirms the above findings CT Brain Perfusion Findings: CBF (<30%) Volume: None Perfusion (Tmax>6.0s) volume: None Mismatch Volume: None Infarction Location: None These results were communicated to Dr. Cheral Marker At 3:33 pmon 1/12/2021by text page via the [facility] messaging system. IMPRESSION: CTA neck: 1. The bilateral common and internal carotid arteries are patent within the neck without significant stenosis. Mild plaque at the carotid bifurcations and within the proximal ICAs. 2. Significantly dominant left vertebral artery with redemonstrated moderate/severe ostial stenosis. The vertebral arteries are otherwise patent within the neck without significant stenosis. 3. Ill-defined airspace opacity within the posterior aspect of the partially imaged right upper lobe suspicious for pneumonia. CTA head: 1. No intracranial large vessel occlusion or proximal high-grade arterial stenosis identified. 2. Redemonstrated multifocal high-grade stenoses within distal left anterior cerebral artery branches. 3. Calcified atherosclerosis within the intracranial ICAs, but no more than mild stenosis. CT perfusion head: 1. The perfusion software detects no core infarct. 2. The perfusion software detects no region of critically hypoperfused parenchyma utilizing a Tmax>6 seconds threshold. Electronically Signed   By: Kellie Simmering DO   On: 11/25/2019 15:31   CT Code Stroke CTA Neck W/WO contrast  Result Date: 11/25/2019 CLINICAL DATA:  Neuro deficit, acute, stroke suspected. EXAM: CT ANGIOGRAPHY HEAD  AND NECK CT PERFUSION BRAIN TECHNIQUE: Multidetector CT imaging of the head and neck was performed using the standard protocol during bolus administration of intravenous contrast. Multiplanar CT image reconstructions and MIPs were obtained to evaluate the vascular anatomy. Carotid stenosis measurements (when applicable) are obtained utilizing NASCET criteria, using the distal internal carotid  diameter as the denominator. Multiphase CT imaging of the brain was performed following IV bolus contrast injection. Subsequent parametric perfusion maps were calculated using RAPID software. CONTRAST:  171mL OMNIPAQUE IOHEXOL 350 MG/ML SOLN COMPARISON:  Noncontrast head CT performed earlier the same day, CT angiogram head/neck 11/14/2019 FINDINGS: CTA NECK FINDINGS Aortic arch: Standard aortic branching. Scattered soft and calcified plaque within the visualized aortic arch and proximal major branch vessels of the neck. Right carotid system: CCA widely patent to the bifurcation. Mild atherosclerotic plaque within the carotid bifurcation and proximal ICA. No measurable stenosis of the proximal ICA relative to the more distal vessel. Left carotid system: CCA patent to the bifurcation. Soft and calcified plaque within the carotid bifurcation and proximal ICA. No measurable stenosis of the proximal ICA as compared to the more distal vessel. Vertebral arteries: The left vertebral artery is significantly dominant. The non dominant right vertebral artery is patent throughout the neck without significant stenosis (50% or greater). Mixed plaque results in unchanged moderate/severe ostial stenosis at the origin of the dominant left vertebral artery. Distal to this, the left vertebral artery is patent within the neck without significant stenosis (50% or greater). Skeleton: No acute bony abnormality. Cervical spondylosis with multilevel shallow posterior disc osteophytes, uncovertebral and facet hypertrophy. No high-grade bony spinal canal stenosis. Other neck: No neck mass or cervical lymphadenopathy. Subcentimeter right thyroid lobe nodule, not meeting consensus criteria for ultrasound follow-up. Upper chest: Ill-defined opacity within the posterior aspect of the partially visualized right upper lobe suspicious for pneumonia. Review of the MIP images confirms the above findings CTA HEAD FINDINGS Anterior circulation: The  intracranial internal carotid arteries are patent bilaterally with scattered calcified plaque but no more than mild stenosis. The right middle and anterior cerebral arteries are patent without proximal branch occlusion or high-grade proximal stenosis. The left middle cerebral artery is patent without proximal branch occlusion or high-grade proximal arterial stenosis identified. The left anterior cerebral artery is patent without significant proximal stenosis. Redemonstrated multifocal high-grade stenoses within distal left anterior cerebral artery branches (for instance as seen on series 6, image 58) (series 11, image 23). No intracranial aneurysm is identified. Posterior circulation: The non dominant intracranial right vertebral artery is patent and appears to terminate as the right PICA. The dominant intracranial left vertebral artery is patent without significant stenosis, as is the basilar artery. The bilateral posterior cerebral arteries are patent without significant proximal stenosis. Posterior communicating arteries are poorly delineated and may be hypoplastic or absent bilaterally. Venous sinuses: Within limitations of contrast timing, no convincing thrombus. Anatomic variants: As described Review of the MIP images confirms the above findings CT Brain Perfusion Findings: CBF (<30%) Volume: None Perfusion (Tmax>6.0s) volume: None Mismatch Volume: None Infarction Location: None These results were communicated to Dr. Cheral Marker At 3:33 pmon 1/12/2021by text page via the [facility] messaging system. IMPRESSION: CTA neck: 1. The bilateral common and internal carotid arteries are patent within the neck without significant stenosis. Mild plaque at the carotid bifurcations and within the proximal ICAs. 2. Significantly dominant left vertebral artery with redemonstrated moderate/severe ostial stenosis. The vertebral arteries are otherwise patent within the neck without significant stenosis. 3. Ill-defined airspace opacity  within the posterior aspect of the partially imaged right upper lobe suspicious for pneumonia. CTA head: 1. No intracranial large vessel occlusion or proximal high-grade arterial stenosis identified. 2. Redemonstrated multifocal high-grade stenoses within distal left anterior cerebral artery branches. 3. Calcified atherosclerosis within the intracranial ICAs, but no more than mild stenosis. CT perfusion head: 1. The perfusion software detects no core infarct. 2. The perfusion software detects no region of critically hypoperfused parenchyma utilizing a Tmax>6 seconds threshold. Electronically Signed   By: Kellie Simmering DO   On: 11/25/2019 15:31   CT Code Stroke Cerebral Perfusion with contrast  Result Date: 11/25/2019 CLINICAL DATA:  Neuro deficit, acute, stroke suspected. EXAM: CT ANGIOGRAPHY HEAD AND NECK CT PERFUSION BRAIN TECHNIQUE: Multidetector CT imaging of the head and neck was performed using the standard protocol during bolus administration of intravenous contrast. Multiplanar CT image reconstructions and MIPs were obtained to evaluate the vascular anatomy. Carotid stenosis measurements (when applicable) are obtained utilizing NASCET criteria, using the distal internal carotid diameter as the denominator. Multiphase CT imaging of the brain was performed following IV bolus contrast injection. Subsequent parametric perfusion maps were calculated using RAPID software. CONTRAST:  17mL OMNIPAQUE IOHEXOL 350 MG/ML SOLN COMPARISON:  Noncontrast head CT performed earlier the same day, CT angiogram head/neck 11/14/2019 FINDINGS: CTA NECK FINDINGS Aortic arch: Standard aortic branching. Scattered soft and calcified plaque within the visualized aortic arch and proximal major branch vessels of the neck. Right carotid system: CCA widely patent to the bifurcation. Mild atherosclerotic plaque within the carotid bifurcation and proximal ICA. No measurable stenosis of the proximal ICA relative to the more distal vessel.  Left carotid system: CCA patent to the bifurcation. Soft and calcified plaque within the carotid bifurcation and proximal ICA. No measurable stenosis of the proximal ICA as compared to the more distal vessel. Vertebral arteries: The left vertebral artery is significantly dominant. The non dominant right vertebral artery is patent throughout the neck without significant stenosis (50% or greater). Mixed plaque results in unchanged moderate/severe ostial stenosis at the origin of the dominant left vertebral artery. Distal to this, the left vertebral artery is patent within the neck without significant stenosis (50% or greater). Skeleton: No acute bony abnormality. Cervical spondylosis with multilevel shallow posterior disc osteophytes, uncovertebral and facet hypertrophy. No high-grade bony spinal canal stenosis. Other neck: No neck mass or cervical lymphadenopathy. Subcentimeter right thyroid lobe nodule, not meeting consensus criteria for ultrasound follow-up. Upper chest: Ill-defined opacity within the posterior aspect of the partially visualized right upper lobe suspicious for pneumonia. Review of the MIP images confirms the above findings CTA HEAD FINDINGS Anterior circulation: The intracranial internal carotid arteries are patent bilaterally with scattered calcified plaque but no more than mild stenosis. The right middle and anterior cerebral arteries are patent without proximal branch occlusion or high-grade proximal stenosis. The left middle cerebral artery is patent without proximal branch occlusion or high-grade proximal arterial stenosis identified. The left anterior cerebral artery is patent without significant proximal stenosis. Redemonstrated multifocal high-grade stenoses within distal left anterior cerebral artery branches (for instance as seen on series 6, image 58) (series 11, image 23). No intracranial aneurysm is identified. Posterior circulation: The non dominant intracranial right vertebral artery  is patent and appears to terminate as the right PICA. The dominant intracranial left vertebral artery is patent without significant stenosis, as is the basilar artery. The bilateral posterior cerebral arteries are patent without significant proximal stenosis. Posterior communicating arteries are poorly delineated and may be hypoplastic or  absent bilaterally. Venous sinuses: Within limitations of contrast timing, no convincing thrombus. Anatomic variants: As described Review of the MIP images confirms the above findings CT Brain Perfusion Findings: CBF (<30%) Volume: None Perfusion (Tmax>6.0s) volume: None Mismatch Volume: None Infarction Location: None These results were communicated to Dr. Cheral Marker At 3:33 pmon 1/12/2021by text page via the [facility] messaging system. IMPRESSION: CTA neck: 1. The bilateral common and internal carotid arteries are patent within the neck without significant stenosis. Mild plaque at the carotid bifurcations and within the proximal ICAs. 2. Significantly dominant left vertebral artery with redemonstrated moderate/severe ostial stenosis. The vertebral arteries are otherwise patent within the neck without significant stenosis. 3. Ill-defined airspace opacity within the posterior aspect of the partially imaged right upper lobe suspicious for pneumonia. CTA head: 1. No intracranial large vessel occlusion or proximal high-grade arterial stenosis identified. 2. Redemonstrated multifocal high-grade stenoses within distal left anterior cerebral artery branches. 3. Calcified atherosclerosis within the intracranial ICAs, but no more than mild stenosis. CT perfusion head: 1. The perfusion software detects no core infarct. 2. The perfusion software detects no region of critically hypoperfused parenchyma utilizing a Tmax>6 seconds threshold. Electronically Signed   By: Kellie Simmering DO   On: 11/25/2019 15:31   Overnight EEG with video  Result Date: 11/27/2019 Lora Havens, MD     11/27/2019 11:23  AM Patient Name: KEYSHAWN SNIDE MRN: [id number] Epilepsy Attending: Lora Havens Referring Physician/Provider: Dr Kerney Elbe Duration: 11/26/2019 0939 to 11/27/2019 1043  Patient history: 84 y.o.malewith h/o "staring spells" presenting after acute onset of aphasia, right facial droop and right sided weakness at home. EEG to evaluate for seizure.  Level of alertness: awake, asleep  AEDs during EEG study: None  Technical aspects: This EEG study was done with scalp electrodes positioned according to the 10-20 International system of electrode placement. Electrical activity was acquired at a sampling rate of 500Hz  and reviewed with a high frequency filter of 70Hz  and a low frequency filter of 1Hz . EEG data were recorded continuously and digitally stored.  DESCRIPTION: No clear posterior dominant rhythm was seen. Sleep was characterized by vertex waves, sleep spindles (12-14Hz ), maximal frontocentral. EEG showed continuous generalized 3-5hz  theta-delta slowing as well as intermittent rhythmic generalized 2-3Hz  delta slowing.  Hyperventilation and photic stimulation were not performed.  ABNORMALITY - Continuous slow, generalized - Intermittent rhythmic slow, generalized  IMPRESSION: This study is suggestive of moderate diffuse encephalopathy, non specific to etiology.  No seizures or epileptiform discharges were seen throughout the recording.  Lora Havens   CT HEAD CODE STROKE WO CONTRAST  Result Date: 11/25/2019 CLINICAL DATA:  Code stroke.  Aphasia.  Right-sided weakness. EXAM: CT HEAD WITHOUT CONTRAST TECHNIQUE: Contiguous axial images were obtained from the base of the skull through the vertex without intravenous contrast. COMPARISON:  MRI 11/14/2019 FINDINGS: Brain: Generalized atrophy. Chronic small-vessel ischemic changes throughout the brain as seen previously. No CT evidence of acute infarction, mass lesion, hemorrhage, hydrocephalus or extra-axial collection. Vascular: There is  atherosclerotic calcification of the major vessels at the base of the brain. Skull: Normal Sinuses/Orbits: Clear/normal Other: None ASPECTS ([place] Stroke Program Early CT Score) - Ganglionic level infarction (caudate, lentiform nuclei, internal capsule, insula, M1-M3 cortex): 7 - Supraganglionic infarction (M4-M6 cortex): 3 Total score (0-10 with 10 being normal): 10 IMPRESSION: 1. No acute finding by CT. Atrophy and chronic small-vessel ischemic changes as seen previously. 2. ASPECTS is 10 3. These results were communicated to Dr. Cheral Marker at 2:29 pmon 1/12/2021by  text page via the [facility] messaging system. Electronically Signed   By: Nelson Chimes M.D.   On: 11/25/2019 14:30        Scheduled Meds: . amoxicillin  500 mg Oral BID  . aspirin EC  81 mg Oral QPM  . carvedilol  3.125 mg Oral BID  . doxycycline  100 mg Oral BID  . DULoxetine  30 mg Oral Daily  . enzalutamide  160 mg Oral Q supper  . folic acid  1 mg Oral Once per day on Sun Mon Wed Thu Fri Sat  . isosorbide mononitrate  30 mg Oral Daily  . rosuvastatin  10 mg Oral Daily  . sodium chloride flush  3 mL Intravenous Q12H   Continuous Infusions: . sodium chloride            Aline August, MD [facility] 11/27/2019, 1:20 PM

## 2019-11-27 NOTE — Procedures (Signed)
Patient Name: Corey Oneill  MRN: [id number]  Epilepsy Attending: Lora Havens  Referring Physician/Provider: Dr Kerney Elbe Duration: 11/26/2019 0939 to 11/27/2019 1043  Patient history: 84 y.o.malewith h/o "staring spells" presenting after acute onset of aphasia, right facial droop and right sided weakness at home. EEG to evaluate for seizure.   Level of alertness: awake, asleep  AEDs during EEG study: None  Technical aspects: This EEG study was done with scalp electrodes positioned according to the 10-20 International system of electrode placement. Electrical activity was acquired at a sampling rate of 500Hz  and reviewed with a high frequency filter of 70Hz  and a low frequency filter of 1Hz . EEG data were recorded continuously and digitally stored.   DESCRIPTION: No clear posterior dominant rhythm was seen. Sleep was characterized by vertex waves, sleep spindles (12-14Hz ), maximal frontocentral. EEG showed continuous generalized 3-5hz  theta-delta slowing as well as intermittent rhythmic generalized 2-3Hz  delta slowing.  Hyperventilation and photic stimulation were not performed.  ABNORMALITY - Continuous slow, generalized - Intermittent rhythmic slow, generalized   IMPRESSION: This study is suggestive of moderate diffuse encephalopathy, non specific to etiology.  No seizures or epileptiform discharges were seen throughout the recording.  [name] Barbra Sarks

## 2019-11-27 NOTE — Progress Notes (Signed)
SLP Cancellation Note  Patient Details Name: Corey Oneill MRN: [id number] DOB: 1936-02-04   Cancelled treatment:        Patient having EEG. Will see at next available time.    Wynelle Bourgeois., MA, CCC-SLP 11/27/2019, 9:08 AM

## 2019-11-27 NOTE — Discharge Summary (Signed)
Physician Discharge Summary  Corey Oneill [id number] DOB: April 26, 1936 DOA: 11/25/2019  PCP: [name], MD  Admit date: 11/25/2019 Discharge date: 11/28/2019  Admitted From: Home Disposition: Home  Recommendations for Outpatient Follow-up:  1. Follow up with PCP in 1 week with repeat CBC/BMP 2. Outpatient follow-up with neurology 3. Follow up in ED if symptoms worsen or new appear   Home Health: PT/OT.  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  Neurology was consulted.  CT head was negative for acute abnormality.  CTA head and neck as well as CT perfusion did not reveal any abnormalities.  There was a concern for seizure as well.  EEG and LTM EEG have been negative.  Mental status has much improved.  Neurology has cleared the patient for discharge.  He will be discharged home today.  Patient refused CIR placement.  Discharge Diagnoses:   Acute onset of aphasia, right-sided facial droop and right-sided weakness -Stroke work-up with CT head, CTA head and neck and CT perfusion are negative for any acute abnormalities. -Neurology was consulted.  There was a concern for partial complex seizures.  EEG and LTM EEG were negative for seizure activities. -Neurology has cleared the patient for discharge and recommended to continue aspirin 81 mg daily.  Outpatient follow-up with neurology. -Mental status is much improved.  No seizure-like activity since admission -PT/OT recommended CIR placement which patient refused.  He will need home health  PT/OT Hypertension -Continue home regimen.  Outpatient follow-up  Hyperlipidemia -Continue Crestor  Metastatic prostate cancer -Outpatient follow-up with oncology/Dr. Alen Blew  Status post TAVR History of enterococcal bacteremia -Cultures negative so far. -Recent coronary CTA was negative for apparent vegetations.  TEE could not be performed during prior hospitalization because of large hiatal hernia. -Outpatient follow-up with cardiology  Chronic ITP -Unable to add/change antiplatelet agent due to chronically low platelets -Outpatient follow-up with Dr. Alen Blew  Bullous pemphigoid -Outpatient follow-up with dermatology.  Continue methotrexate, folic acid and doxycycline.  Discharge Instructions   Allergies as of 11/28/2019      Reactions   Prednisone Other (See Comments)   Makes the patient depressed      Medication List    TAKE these medications   acetaminophen 500 MG tablet Commonly known as: TYLENOL Take 500 mg by mouth every 6 (six) hours as needed for moderate pain.   amoxicillin 500 MG tablet Commonly known as: AMOXIL Take 1 tablet (500 mg total) by mouth 2 (two) times daily.   aspirin EC 81 MG tablet Take 81 mg by mouth every evening.   carvedilol 3.125 MG tablet Commonly known as: COREG TAKE 1 TABLET BY MOUTH TWICE DAILY.   CITRACAL +D3 PO Take 1 tablet by mouth at bedtime.   doxycycline 100 MG capsule Commonly known as: MONODOX Take 100 mg by mouth 2 (two) times daily.   DULoxetine 30 MG capsule Commonly known as: CYMBALTA Take 30 mg by mouth daily.   enzalutamide 40 MG capsule Commonly known as: XTANDI Take 160 mg by mouth daily with supper.   FeroSul 325 (65 FE) MG tablet Generic drug: ferrous sulfate Take 325 mg by mouth daily with breakfast.   folic acid 1 MG tablet Commonly  known as: FOLVITE Take 1 mg by mouth See admin instructions. Take 1 mg by mouth once a day on Sun/Mon/Wed/Thurs/Fri/Sat (not on Tuesdays)   isosorbide mononitrate  30 MG 24 hr tablet Commonly known as: IMDUR Take 1 tablet (30 mg total) by mouth daily.   methotrexate 2.5 MG tablet Commonly known as: RHEUMATREX Take 10 mg by mouth every Tuesday.   MULTIPLE VITAMIN PO Take 1 tablet by mouth daily.   nitroGLYCERIN 0.4 MG SL tablet Commonly known as: NITROSTAT 1 TAB UNDER TONGUE AS NEEDED FOR CHEST PAIN. MAY REPEAT EVERY 5 MIN FOR A TOTAL OF 3 DOSES. What changed: See the new instructions.   rosuvastatin 10 MG tablet Commonly known as: CRESTOR TAKE 1 TABLET ONCE DAILY.       Follow-up Information    [name], MD Follow up.   Specialty: Family Medicine Contact information: [place] [address] [phone]        [name], MD .   Specialty: Cardiology Contact information: [phone] [place] [address] [phone]          Allergies  Allergen Reactions  . Prednisone Other (See Comments)    Makes the patient depressed    Consultations:  Neurology   Procedures/Studies: EEG  Result Date: 11/25/2019 Lora Havens, MD     11/25/2019  5:42 PM Patient Name: Corey Oneill MRN: [id number] Epilepsy Attending: Lora Havens Referring Physician/Provider: Dr Kerney Elbe Date: 11/25/2019 Duration: 23.41 mins Patient history: 84 y.o. male with h/o "staring spells" presenting after acute onset of aphasia, right facial droop and right sided weakness at home. EEG to evaluate for seizure. Level of alertness: awake AEDs during EEG study: None Technical aspects: This EEG study was done with scalp electrodes positioned according to the 10-20 International system of electrode placement. Electrical activity was acquired at a sampling rate of 500Hz  and reviewed with a high frequency filter of 70Hz  and a low frequency filter of 1Hz . EEG data were recorded continuously and digitally stored. DESCRIPTION: No clear posterior dominant rhythm was seen. EEG showed continuous generalized 3-5hz   theta-delta slowing as well as intermittent rhythmic generalized 2-3Hz  delta slowing.  Hyperventilation and photic stimulation were not performed. ABNORMALITY - Continuous slow, generalized - Intermittent rhythmic slow, generalized IMPRESSION: This study is suggestive of moderate diffuse encephalopathy, non specific to etiology.  No seizures or epileptiform discharges were seen throughout the recording. Lora Havens   CT Code Stroke CTA Head W/WO contrast  Result Date: 11/25/2019 CLINICAL DATA:  Neuro deficit, acute, stroke suspected. EXAM: CT ANGIOGRAPHY HEAD AND NECK CT PERFUSION BRAIN TECHNIQUE: Multidetector CT imaging of the head and neck was performed using the standard protocol during bolus administration of intravenous contrast. Multiplanar CT image reconstructions and MIPs were obtained to evaluate the vascular anatomy. Carotid stenosis measurements (when applicable) are obtained utilizing NASCET criteria, using the distal internal carotid diameter as the denominator. Multiphase CT imaging of the brain was performed following IV bolus contrast injection. Subsequent parametric perfusion maps were calculated using RAPID software. CONTRAST:  19mL OMNIPAQUE IOHEXOL 350 MG/ML SOLN COMPARISON:  Noncontrast head CT performed earlier the same day, CT angiogram head/neck 11/14/2019 FINDINGS: CTA NECK FINDINGS Aortic arch: Standard aortic branching. Scattered soft and calcified plaque within the visualized aortic arch and proximal major branch vessels of the neck. Right carotid system: CCA widely patent to the bifurcation. Mild atherosclerotic plaque within the carotid bifurcation and proximal ICA. No measurable stenosis of the proximal  ICA relative to the more distal vessel. Left carotid system: CCA patent to the bifurcation. Soft and calcified plaque within the carotid bifurcation and proximal ICA. No measurable stenosis of the proximal ICA as compared to the more distal vessel. Vertebral arteries: The  left vertebral artery is significantly dominant. The non dominant right vertebral artery is patent throughout the neck without significant stenosis (50% or greater). Mixed plaque results in unchanged moderate/severe ostial stenosis at the origin of the dominant left vertebral artery. Distal to this, the left vertebral artery is patent within the neck without significant stenosis (50% or greater). Skeleton: No acute bony abnormality. Cervical spondylosis with multilevel shallow posterior disc osteophytes, uncovertebral and facet hypertrophy. No high-grade bony spinal canal stenosis. Other neck: No neck mass or cervical lymphadenopathy. Subcentimeter right thyroid lobe nodule, not meeting consensus criteria for ultrasound follow-up. Upper chest: Ill-defined opacity within the posterior aspect of the partially visualized right upper lobe suspicious for pneumonia. Review of the MIP images confirms the above findings CTA HEAD FINDINGS Anterior circulation: The intracranial internal carotid arteries are patent bilaterally with scattered calcified plaque but no more than mild stenosis. The right middle and anterior cerebral arteries are patent without proximal branch occlusion or high-grade proximal stenosis. The left middle cerebral artery is patent without proximal branch occlusion or high-grade proximal arterial stenosis identified. The left anterior cerebral artery is patent without significant proximal stenosis. Redemonstrated multifocal high-grade stenoses within distal left anterior cerebral artery branches (for instance as seen on series 6, image 58) (series 11, image 23). No intracranial aneurysm is identified. Posterior circulation: The non dominant intracranial right vertebral artery is patent and appears to terminate as the right PICA. The dominant intracranial left vertebral artery is patent without significant stenosis, as is the basilar artery. The bilateral posterior cerebral arteries are patent without  significant proximal stenosis. Posterior communicating arteries are poorly delineated and may be hypoplastic or absent bilaterally. Venous sinuses: Within limitations of contrast timing, no convincing thrombus. Anatomic variants: As described Review of the MIP images confirms the above findings CT Brain Perfusion Findings: CBF (<30%) Volume: None Perfusion (Tmax>6.0s) volume: None Mismatch Volume: None Infarction Location: None These results were communicated to Dr. Cheral Marker At 3:33 pmon 1/12/2021by text page via the [facility] messaging system. IMPRESSION: CTA neck: 1. The bilateral common and internal carotid arteries are patent within the neck without significant stenosis. Mild plaque at the carotid bifurcations and within the proximal ICAs. 2. Significantly dominant left vertebral artery with redemonstrated moderate/severe ostial stenosis. The vertebral arteries are otherwise patent within the neck without significant stenosis. 3. Ill-defined airspace opacity within the posterior aspect of the partially imaged right upper lobe suspicious for pneumonia. CTA head: 1. No intracranial large vessel occlusion or proximal high-grade arterial stenosis identified. 2. Redemonstrated multifocal high-grade stenoses within distal left anterior cerebral artery branches. 3. Calcified atherosclerosis within the intracranial ICAs, but no more than mild stenosis. CT perfusion head: 1. The perfusion software detects no core infarct. 2. The perfusion software detects no region of critically hypoperfused parenchyma utilizing a Tmax>6 seconds threshold. Electronically Signed   By: Kellie Simmering DO   On: 11/25/2019 15:31   CT Code Stroke CTA Head W/WO contrast  Result Date: 11/14/2019 CLINICAL DATA:  Aphasia.  Last seen normal 1700 hours. EXAM: CT ANGIOGRAPHY HEAD AND NECK TECHNIQUE: Multidetector CT imaging of the head and neck was performed using the standard protocol during bolus administration of intravenous contrast. Multiplanar CT  image reconstructions and MIPs were obtained to evaluate  the vascular anatomy. Carotid stenosis measurements (when applicable) are obtained utilizing NASCET criteria, using the distal internal carotid diameter as the denominator. CONTRAST:  133mL OMNIPAQUE IOHEXOL 350 MG/ML SOLN COMPARISON:  Head CT earlier same day.  The FINDINGS: CTA NECK FINDINGS Aortic arch: Aortic atherosclerosis. Branching pattern is normal without origin stenosis. Right carotid system: Common carotid artery widely patent to the bifurcation. Carotid bifurcation shows mild atherosclerotic plaque but no stenosis. Cervical ICA is widely patent. Left carotid system: Common carotid artery is widely patent to the bifurcation. Carotid bifurcation shows atherosclerotic plaque but no stenosis. Cervical ICA is widely patent. Vertebral arteries: The left vertebral artery is dominant. Atherosclerotic plaque at the dominant left vertebral artery origin with stenosis of 70%. Small right vertebral artery widely patent at the origin. Beyond the origin stenosis on the left, both vertebral arteries are widely patent through the cervical region to the foramen magnum. Skeleton: Ordinary cervical spondylosis. Other neck: No mass or lymphadenopathy. Upper chest: Ordinary pleural and parenchymal scarring. Review of the MIP images confirms the above findings CTA HEAD FINDINGS Anterior circulation: Both internal carotid arteries are patent through the skull base and siphon regions. There is atherosclerotic calcification in the carotid siphon regions but no stenosis greater than 30%. The anterior and middle cerebral vessels are patent without proximal stenosis, aneurysm or vascular malformation. No large or medium vessel occlusion. Posterior circulation: Both vertebral arteries are patent at the foramen magnum level. The small right vertebral artery terminates in PICA. The left vertebral artery supplies the basilar without stenosis. No basilar stenosis. Posterior  circulation branch vessels are patent and normal. Venous sinuses: Patent and normal. Anatomic variants: None significant. Review of the MIP images confirms the above findings IMPRESSION: No acute large or medium vessel occlusion. No significant intracranial stenotic disease. Atherosclerotic disease at both carotid bifurcations but no stenosis. 70% stenosis at the dominant left vertebral artery origin. These results were communicated to Dr. Cheral Marker at 7:28 pmon 1/1/2021by text page via the [facility] messaging system. Electronically Signed   By: Nelson Chimes M.D.   On: 11/14/2019 19:28   CT Code Stroke CTA Neck W/WO contrast  Result Date: 11/25/2019 CLINICAL DATA:  Neuro deficit, acute, stroke suspected. EXAM: CT ANGIOGRAPHY HEAD AND NECK CT PERFUSION BRAIN TECHNIQUE: Multidetector CT imaging of the head and neck was performed using the standard protocol during bolus administration of intravenous contrast. Multiplanar CT image reconstructions and MIPs were obtained to evaluate the vascular anatomy. Carotid stenosis measurements (when applicable) are obtained utilizing NASCET criteria, using the distal internal carotid diameter as the denominator. Multiphase CT imaging of the brain was performed following IV bolus contrast injection. Subsequent parametric perfusion maps were calculated using RAPID software. CONTRAST:  151mL OMNIPAQUE IOHEXOL 350 MG/ML SOLN COMPARISON:  Noncontrast head CT performed earlier the same day, CT angiogram head/neck 11/14/2019 FINDINGS: CTA NECK FINDINGS Aortic arch: Standard aortic branching. Scattered soft and calcified plaque within the visualized aortic arch and proximal major branch vessels of the neck. Right carotid system: CCA widely patent to the bifurcation. Mild atherosclerotic plaque within the carotid bifurcation and proximal ICA. No measurable stenosis of the proximal ICA relative to the more distal vessel. Left carotid system: CCA patent to the bifurcation. Soft and calcified  plaque within the carotid bifurcation and proximal ICA. No measurable stenosis of the proximal ICA as compared to the more distal vessel. Vertebral arteries: The left vertebral artery is significantly dominant. The non dominant right vertebral artery is patent throughout the neck without significant stenosis (  50% or greater). Mixed plaque results in unchanged moderate/severe ostial stenosis at the origin of the dominant left vertebral artery. Distal to this, the left vertebral artery is patent within the neck without significant stenosis (50% or greater). Skeleton: No acute bony abnormality. Cervical spondylosis with multilevel shallow posterior disc osteophytes, uncovertebral and facet hypertrophy. No high-grade bony spinal canal stenosis. Other neck: No neck mass or cervical lymphadenopathy. Subcentimeter right thyroid lobe nodule, not meeting consensus criteria for ultrasound follow-up. Upper chest: Ill-defined opacity within the posterior aspect of the partially visualized right upper lobe suspicious for pneumonia. Review of the MIP images confirms the above findings CTA HEAD FINDINGS Anterior circulation: The intracranial internal carotid arteries are patent bilaterally with scattered calcified plaque but no more than mild stenosis. The right middle and anterior cerebral arteries are patent without proximal branch occlusion or high-grade proximal stenosis. The left middle cerebral artery is patent without proximal branch occlusion or high-grade proximal arterial stenosis identified. The left anterior cerebral artery is patent without significant proximal stenosis. Redemonstrated multifocal high-grade stenoses within distal left anterior cerebral artery branches (for instance as seen on series 6, image 58) (series 11, image 23). No intracranial aneurysm is identified. Posterior circulation: The non dominant intracranial right vertebral artery is patent and appears to terminate as the right PICA. The dominant  intracranial left vertebral artery is patent without significant stenosis, as is the basilar artery. The bilateral posterior cerebral arteries are patent without significant proximal stenosis. Posterior communicating arteries are poorly delineated and may be hypoplastic or absent bilaterally. Venous sinuses: Within limitations of contrast timing, no convincing thrombus. Anatomic variants: As described Review of the MIP images confirms the above findings CT Brain Perfusion Findings: CBF (<30%) Volume: None Perfusion (Tmax>6.0s) volume: None Mismatch Volume: None Infarction Location: None These results were communicated to Dr. Cheral Marker At 3:33 pmon 1/12/2021by text page via the [facility] messaging system. IMPRESSION: CTA neck: 1. The bilateral common and internal carotid arteries are patent within the neck without significant stenosis. Mild plaque at the carotid bifurcations and within the proximal ICAs. 2. Significantly dominant left vertebral artery with redemonstrated moderate/severe ostial stenosis. The vertebral arteries are otherwise patent within the neck without significant stenosis. 3. Ill-defined airspace opacity within the posterior aspect of the partially imaged right upper lobe suspicious for pneumonia. CTA head: 1. No intracranial large vessel occlusion or proximal high-grade arterial stenosis identified. 2. Redemonstrated multifocal high-grade stenoses within distal left anterior cerebral artery branches. 3. Calcified atherosclerosis within the intracranial ICAs, but no more than mild stenosis. CT perfusion head: 1. The perfusion software detects no core infarct. 2. The perfusion software detects no region of critically hypoperfused parenchyma utilizing a Tmax>6 seconds threshold. Electronically Signed   By: Kellie Simmering DO   On: 11/25/2019 15:31   CT Code Stroke CTA Neck W/WO contrast  Result Date: 11/14/2019 CLINICAL DATA:  Aphasia.  Last seen normal 1700 hours. EXAM: CT ANGIOGRAPHY HEAD AND NECK  TECHNIQUE: Multidetector CT imaging of the head and neck was performed using the standard protocol during bolus administration of intravenous contrast. Multiplanar CT image reconstructions and MIPs were obtained to evaluate the vascular anatomy. Carotid stenosis measurements (when applicable) are obtained utilizing NASCET criteria, using the distal internal carotid diameter as the denominator. CONTRAST:  177mL OMNIPAQUE IOHEXOL 350 MG/ML SOLN COMPARISON:  Head CT earlier same day.  The FINDINGS: CTA NECK FINDINGS Aortic arch: Aortic atherosclerosis. Branching pattern is normal without origin stenosis. Right carotid system: Common carotid artery widely patent to the  bifurcation. Carotid bifurcation shows mild atherosclerotic plaque but no stenosis. Cervical ICA is widely patent. Left carotid system: Common carotid artery is widely patent to the bifurcation. Carotid bifurcation shows atherosclerotic plaque but no stenosis. Cervical ICA is widely patent. Vertebral arteries: The left vertebral artery is dominant. Atherosclerotic plaque at the dominant left vertebral artery origin with stenosis of 70%. Small right vertebral artery widely patent at the origin. Beyond the origin stenosis on the left, both vertebral arteries are widely patent through the cervical region to the foramen magnum. Skeleton: Ordinary cervical spondylosis. Other neck: No mass or lymphadenopathy. Upper chest: Ordinary pleural and parenchymal scarring. Review of the MIP images confirms the above findings CTA HEAD FINDINGS Anterior circulation: Both internal carotid arteries are patent through the skull base and siphon regions. There is atherosclerotic calcification in the carotid siphon regions but no stenosis greater than 30%. The anterior and middle cerebral vessels are patent without proximal stenosis, aneurysm or vascular malformation. No large or medium vessel occlusion. Posterior circulation: Both vertebral arteries are patent at the foramen  magnum level. The small right vertebral artery terminates in PICA. The left vertebral artery supplies the basilar without stenosis. No basilar stenosis. Posterior circulation branch vessels are patent and normal. Venous sinuses: Patent and normal. Anatomic variants: None significant. Review of the MIP images confirms the above findings IMPRESSION: No acute large or medium vessel occlusion. No significant intracranial stenotic disease. Atherosclerotic disease at both carotid bifurcations but no stenosis. 70% stenosis at the dominant left vertebral artery origin. These results were communicated to Dr. Cheral Marker at 7:28 pmon 1/1/2021by text page via the [facility] messaging system. Electronically Signed   By: Nelson Chimes M.D.   On: 11/14/2019 19:28   MR BRAIN WO CONTRAST  Result Date: 11/14/2019 CLINICAL DATA:  Initial evaluation for focal neural deficit, stroke suspected. EXAM: MRI HEAD WITHOUT CONTRAST TECHNIQUE: Multiplanar, multiecho pulse sequences of the brain and surrounding structures were obtained without intravenous contrast. COMPARISON:  Prior CTs from earlier the same day. FINDINGS: Brain: Moderately advanced cerebral atrophy, most pronounced at the anterior temporal lobes bilaterally. Patchy T2/FLAIR hyperintensity within the periventricular and deep white matter both cerebral hemispheres most consistent with chronic small vessel ischemic disease, moderate in nature. Few scatter remote lacunar infarcts noted at the left caudate and hemispheric cerebral white matter. No abnormal foci of restricted diffusion to suggest acute or subacute ischemia. Gray-white matter differentiation well maintained. No encephalomalacia to suggest chronic infarction. No evidence for acute intracranial hemorrhage. Few scattered chronic micro hemorrhages noted involving the supratentorial cerebral white matter, likely small vessel related. No mass lesion, midline shift or mass effect. No hydrocephalus. No extra-axial fluid  collection. Major dural sinuses are grossly patent. Pituitary gland and suprasellar region are normal. Midline structures intact and normal. Vascular: Major intracranial vascular flow voids well maintained. Hypoplastic right vertebral artery noted. Skull and upper cervical spine: Craniocervical junction normal. Visualized upper cervical spine within normal limits. Bone marrow signal intensity normal. No scalp soft tissue abnormality. Sinuses/Orbits: Patient status post bilateral ocular lens replacement. Globes and orbital soft tissues demonstrate no acute finding. Paranasal sinuses are clear. Moderate left with small right mastoid effusions noted, likely benign. Visualized nasopharynx within normal limits. Inner ear structures grossly normal. Other: None. IMPRESSION: 1. No acute intracranial abnormality. 2. Moderately advanced cerebral atrophy with chronic small vessel ischemic disease. Electronically Signed   By: Jeannine Boga M.D.   On: 11/14/2019 22:54   CT Heart Morp W/Cta Cor W/Score W/Ca W/Cm &/Or Wo/Cm  Addendum Date: 11/15/2019   ADDENDUM REPORT: 11/15/2019 17:09 CLINICAL DATA:  84 year old male with h/o severe aortic stenosis, s/p TAVR procedure in 2017, recent E. faecalis bacteremia, now with TIA symptoms. EXAM: Cardiac TAVR CT TECHNIQUE: The patient was scanned on a [employer]. A 120 kV retrospective scan was triggered in the descending thoracic aorta at 111 HU's. Gantry rotation speed was 250 msecs and collimation was .6 mm. No beta blockade or nitro were given. The 3D data set was reconstructed in 5% intervals of the R-R cycle. Systolic and diastolic phases were analyzed on a dedicated work station using MPR, MIP and VRT modes. The patient received 80 cc of contrast. FINDINGS: Aortic Valve: 26 mm Edwards-SAPIEN 3 valve is seen in the aortic position. Leaflets have normal thickness, normal excursions and no evidence for a vegetation or a thrombus. Aorta: Normal size with mild  diffuse atherosclerotic plaque and calcifications. Sinotubular Junction: 32 x 28 mm Ascending Thoracic Aorta: 35 x 34 mm Aortic Arch: Not visualized Descending Thoracic Aorta: 21 x 21 mm IMPRESSION: 1. 26 mm Edwards-SAPIEN 3 valve is seen in the aortic position. Leaflets have normal thickness, normal excursions and no evidence for a vegetation or a thrombus. 2. Mitral valve leaflets have no evidence for vegetation. 3. No thrombus is seen in the left atrium or in the left atrial appendage. Electronically Signed   By: Ena Dawley   On: 11/15/2019 17:09   Result Date: 11/15/2019 EXAM: OVER-READ INTERPRETATION  CT CHEST The following report is an over-read performed by radiologist Dr. Abigail Miyamoto of [facility], [place] on 11/15/2019. This over-read does not include interpretation of cardiac or coronary anatomy or pathology. The coronary CTA interpretation by the cardiologist is attached. COMPARISON:  Chest radiograph 11/14/2019.  Chest CT 01/25/2016 FINDINGS: Vascular: Aortic atherosclerosis. Tortuous thoracic aorta. No central pulmonary embolism, on this non-dedicated study. Mediastinum/Nodes: No imaged thoracic adenopathy. Left infrahilar nodes are calcified, consistent with old granulomatous disease. Lungs/Pleura: No imaged pleural fluid. Calcified left lower lobe granulomas. Bibasilar scarring or subsegmental atelectasis. Upper Abdomen: Normal imaged portions of the liver, spleen, stomach, right adrenal gland, right kidney. Musculoskeletal: Mild midthoracic superior endplate compression deformity. IMPRESSION: 1.  No acute findings in the imaged extracardiac chest. 2.  Aortic Atherosclerosis (ICD10-I70.0). 3. Mild superior endplate compression deformity, of indeterminate acuity. Sagittal reformats not performed on the prior chest CT Electronically Signed: By: Abigail Miyamoto M.D. On: 11/15/2019 15:48   CT Code Stroke Cerebral Perfusion with contrast  Result Date: 11/25/2019 CLINICAL DATA:  Neuro deficit,  acute, stroke suspected. EXAM: CT ANGIOGRAPHY HEAD AND NECK CT PERFUSION BRAIN TECHNIQUE: Multidetector CT imaging of the head and neck was performed using the standard protocol during bolus administration of intravenous contrast. Multiplanar CT image reconstructions and MIPs were obtained to evaluate the vascular anatomy. Carotid stenosis measurements (when applicable) are obtained utilizing NASCET criteria, using the distal internal carotid diameter as the denominator. Multiphase CT imaging of the brain was performed following IV bolus contrast injection. Subsequent parametric perfusion maps were calculated using RAPID software. CONTRAST:  179mL OMNIPAQUE IOHEXOL 350 MG/ML SOLN COMPARISON:  Noncontrast head CT performed earlier the same day, CT angiogram head/neck 11/14/2019 FINDINGS: CTA NECK FINDINGS Aortic arch: Standard aortic branching. Scattered soft and calcified plaque within the visualized aortic arch and proximal major branch vessels of the neck. Right carotid system: CCA widely patent to the bifurcation. Mild atherosclerotic plaque within the carotid bifurcation and proximal ICA. No measurable stenosis of the proximal ICA relative to the  more distal vessel. Left carotid system: CCA patent to the bifurcation. Soft and calcified plaque within the carotid bifurcation and proximal ICA. No measurable stenosis of the proximal ICA as compared to the more distal vessel. Vertebral arteries: The left vertebral artery is significantly dominant. The non dominant right vertebral artery is patent throughout the neck without significant stenosis (50% or greater). Mixed plaque results in unchanged moderate/severe ostial stenosis at the origin of the dominant left vertebral artery. Distal to this, the left vertebral artery is patent within the neck without significant stenosis (50% or greater). Skeleton: No acute bony abnormality. Cervical spondylosis with multilevel shallow posterior disc osteophytes, uncovertebral and  facet hypertrophy. No high-grade bony spinal canal stenosis. Other neck: No neck mass or cervical lymphadenopathy. Subcentimeter right thyroid lobe nodule, not meeting consensus criteria for ultrasound follow-up. Upper chest: Ill-defined opacity within the posterior aspect of the partially visualized right upper lobe suspicious for pneumonia. Review of the MIP images confirms the above findings CTA HEAD FINDINGS Anterior circulation: The intracranial internal carotid arteries are patent bilaterally with scattered calcified plaque but no more than mild stenosis. The right middle and anterior cerebral arteries are patent without proximal branch occlusion or high-grade proximal stenosis. The left middle cerebral artery is patent without proximal branch occlusion or high-grade proximal arterial stenosis identified. The left anterior cerebral artery is patent without significant proximal stenosis. Redemonstrated multifocal high-grade stenoses within distal left anterior cerebral artery branches (for instance as seen on series 6, image 58) (series 11, image 23). No intracranial aneurysm is identified. Posterior circulation: The non dominant intracranial right vertebral artery is patent and appears to terminate as the right PICA. The dominant intracranial left vertebral artery is patent without significant stenosis, as is the basilar artery. The bilateral posterior cerebral arteries are patent without significant proximal stenosis. Posterior communicating arteries are poorly delineated and may be hypoplastic or absent bilaterally. Venous sinuses: Within limitations of contrast timing, no convincing thrombus. Anatomic variants: As described Review of the MIP images confirms the above findings CT Brain Perfusion Findings: CBF (<30%) Volume: None Perfusion (Tmax>6.0s) volume: None Mismatch Volume: None Infarction Location: None These results were communicated to Dr. Cheral Marker At 3:33 pmon 1/12/2021by text page via the [facility]  messaging system. IMPRESSION: CTA neck: 1. The bilateral common and internal carotid arteries are patent within the neck without significant stenosis. Mild plaque at the carotid bifurcations and within the proximal ICAs. 2. Significantly dominant left vertebral artery with redemonstrated moderate/severe ostial stenosis. The vertebral arteries are otherwise patent within the neck without significant stenosis. 3. Ill-defined airspace opacity within the posterior aspect of the partially imaged right upper lobe suspicious for pneumonia. CTA head: 1. No intracranial large vessel occlusion or proximal high-grade arterial stenosis identified. 2. Redemonstrated multifocal high-grade stenoses within distal left anterior cerebral artery branches. 3. Calcified atherosclerosis within the intracranial ICAs, but no more than mild stenosis. CT perfusion head: 1. The perfusion software detects no core infarct. 2. The perfusion software detects no region of critically hypoperfused parenchyma utilizing a Tmax>6 seconds threshold. Electronically Signed   By: Kellie Simmering DO   On: 11/25/2019 15:31   DG Chest Portable 1 View  Result Date: 11/14/2019 CLINICAL DATA:  Code stroke. EXAM: PORTABLE CHEST 1 VIEW COMPARISON:  Chest radiograph 09/25/2019 FINDINGS: Stable cardiomediastinal contours with normal heart size. Stable calcified left hilar lymph nodes. The lungs are clear. No pneumothorax or large pleural effusion. No acute finding in the visualized skeleton. IMPRESSION: No acute cardiopulmonary process. Electronically Signed  By: Audie Pinto M.D.   On: 11/14/2019 19:53   Overnight EEG with video  Result Date: 11/27/2019 Lora Havens, MD     11/27/2019 11:23 AM Patient Name: Corey Oneill MRN: [id number] Epilepsy Attending: Lora Havens Referring Physician/Provider: Dr Kerney Elbe Duration: 11/26/2019 0939 to 11/27/2019 1043  Patient history: 84 y.o.malewith h/o "staring spells" presenting after acute onset of  aphasia, right facial droop and right sided weakness at home. EEG to evaluate for seizure.  Level of alertness: awake, asleep  AEDs during EEG study: None  Technical aspects: This EEG study was done with scalp electrodes positioned according to the 10-20 International system of electrode placement. Electrical activity was acquired at a sampling rate of 500Hz  and reviewed with a high frequency filter of 70Hz  and a low frequency filter of 1Hz . EEG data were recorded continuously and digitally stored.  DESCRIPTION: No clear posterior dominant rhythm was seen. Sleep was characterized by vertex waves, sleep spindles (12-14Hz ), maximal frontocentral. EEG showed continuous generalized 3-5hz  theta-delta slowing as well as intermittent rhythmic generalized 2-3Hz  delta slowing.  Hyperventilation and photic stimulation were not performed.  ABNORMALITY - Continuous slow, generalized - Intermittent rhythmic slow, generalized  IMPRESSION: This study is suggestive of moderate diffuse encephalopathy, non specific to etiology.  No seizures or epileptiform discharges were seen throughout the recording.  Lora Havens   CT HEAD CODE STROKE WO CONTRAST  Result Date: 11/25/2019 CLINICAL DATA:  Code stroke.  Aphasia.  Right-sided weakness. EXAM: CT HEAD WITHOUT CONTRAST TECHNIQUE: Contiguous axial images were obtained from the base of the skull through the vertex without intravenous contrast. COMPARISON:  MRI 11/14/2019 FINDINGS: Brain: Generalized atrophy. Chronic small-vessel ischemic changes throughout the brain as seen previously. No CT evidence of acute infarction, mass lesion, hemorrhage, hydrocephalus or extra-axial collection. Vascular: There is atherosclerotic calcification of the major vessels at the base of the brain. Skull: Normal Sinuses/Orbits: Clear/normal Other: None ASPECTS ([place] Stroke Program Early CT Score) - Ganglionic level infarction (caudate, lentiform nuclei, internal capsule, insula, M1-M3  cortex): 7 - Supraganglionic infarction (M4-M6 cortex): 3 Total score (0-10 with 10 being normal): 10 IMPRESSION: 1. No acute finding by CT. Atrophy and chronic small-vessel ischemic changes as seen previously. 2. ASPECTS is 10 3. These results were communicated to Dr. Cheral Marker at 2:29 pmon 1/12/2021by text page via the [facility] messaging system. Electronically Signed   By: Nelson Chimes M.D.   On: 11/25/2019 14:30   CT HEAD CODE STROKE WO CONTRAST  Result Date: 11/14/2019 CLINICAL DATA:  Code stroke.  Aphasia.  Last seen normal 1700 hours. EXAM: CT HEAD WITHOUT CONTRAST TECHNIQUE: Contiguous axial images were obtained from the base of the skull through the vertex without intravenous contrast. COMPARISON:  09/25/2019 FINDINGS: Brain: Age related atrophy. Old ischemic changes of the pontomedullary junction region. Old small vessel cerebellar infarctions. Cerebral hemispheres show age related atrophy with extensive chronic small vessel disease of the white matter. Presumed artifactual low-density in the right frontal region. No sign of mass lesion, hemorrhage, hydrocephalus or extra-axial collection. Vascular: There is atherosclerotic calcification of the major vessels at the base of the brain. Skull: Negative Sinuses/Orbits: Clear/normal Other: Old healed facial fractures on the right. ASPECTS [facility] Stroke Program Early CT Score) - Ganglionic level infarction (caudate, lentiform nuclei, internal capsule, insula, M1-M3 cortex): 7 - Supraganglionic infarction (M4-M6 cortex): 3 Total score (0-10 with 10 being normal): 10 IMPRESSION: 1. No acute finding by CT. Atrophy and chronic small-vessel ischemic changes. 2. ASPECTS is 10  3. These results were communicated to Dr. Cheral Marker at [place] 1/1/2021by text page via the [facility] messaging system. Electronically Signed   By: Nelson Chimes M.D.   On: 11/14/2019 19:16   ECHOCARDIOGRAM LIMITED  Result Date: 11/15/2019   ECHOCARDIOGRAM LIMITED REPORT   Patient Name:   Corey Oneill Date of Exam: 11/15/2019 Medical Rec #:  [id number]     Height:       70.0 in Accession #:    [id number]    Weight:       133.6 lb Date of Birth:  [birth year]     BSA:          1.76 m Patient Age:    66 years      BP:           99/59 mmHg Patient Gender: M             HR:           74 bpm. Exam Location:  Inpatient  Procedure: Limited Echo, Limited Color Doppler and Cardiac Doppler Indications:    bacteremia 790.7  History:        Patient has prior history of Echocardiogram examinations, most                 recent 09/26/2019. Aortic Valve: A 65 Edwards Edwards Sapien                 bioprosthetic, stented aortic valve (TAVR)  Sonographer:    Johny Chess Referring PhysLW:[id number] TIMOTHY S OPYD  Sonographer Comments: Technically difficult study due to poor echo windows and no parasternal window. Image acquisition challenging due to respiratory motion. IMPRESSIONS  1. Left ventricular ejection fraction, by visual estimation, is 65 to 70%.  2. Left ventricular diastolic parameters are consistent with Grade I diastolic dysfunction (impaired relaxation).  3. Mild mitral annular calcification.  4. Mild mitral valve regurgitation.  5. Moderate thickening of the mitral valve leaflet(s).  6. Tricuspid valve regurgitation is mild.  7. Tricuspid valve regurgitation is mild.  8. Bioprosthetic TAVR valve leaflets appear thickened - this is new from the study on 09/26/2019. Transaortic gradients couldn't be appropriately assessed. TEE is recommended. FINDINGS  Left Ventricle: Left ventricular ejection fraction, by visual estimation, is 65 to 70%. Left ventricular diastolic parameters are consistent with Grade I diastolic dysfunction (impaired relaxation). Mitral Valve: There is moderate thickening of the mitral valve leaflet(s). Mild mitral annular calcification. MV Area by PHT, 2.13 cm. MV PHT, 103.24 msec. Mild mitral valve regurgitation. Tricuspid Valve: Tricuspid valve regurgitation is mild. Aortic Valve: 26 Edwards  Edwards Sapien bioprosthetic, stented aortic valve (TAVR) valve is present in the aortic position.  LEFT VENTRICLE          Normals PLAX 2D LVIDd:         3.70 cm  3.6 cm LVIDs:         2.10 cm  1.7 cm LV PW:         1.00 cm  1.4 cm LV IVS:        1.00 cm  1.3 cm LVOT diam:     2.25 cm  2.0 cm LV SV:         44 ml    79 ml LV SV Index:   25.42    45 ml/m2 LVOT Area:     3.98 cm 3.14 cm2  LEFT ATRIUM         Index LA diam:    3.20  cm 1.82 cm/m   AORTA                 Normals Ao Root diam: 3.30 cm 31 mm MITRAL VALVE               Normals MV Area (PHT): 2.13 cm             SHUNTS MV PHT:        103.24 msec 55 ms    Systemic Diam: 2.25 cm MV Decel Time: 356 msec    187 ms MV E velocity: 64.70 cm/s 103 cm/s MV A velocity: 76.70 cm/s 70.3 cm/s MV E/A ratio:  0.84       1.5  Ena Dawley MD Electronically signed by Ena Dawley MD Signature Date/Time: 11/15/2019/11:49:01 AM    Final        Subjective: Patient seen and examined at bedside.  Denies any seizure-like activity.  Wants to go home today.  No overnight fever, vomiting reported.  Discharge Exam: Vitals:   11/27/19 0844 11/27/19 1000  BP: (!) 109/40 115/60  Pulse: 63   Resp: 16 18  Temp: (!) 97.4 F (36.3 C)   SpO2: 99%     General: Pt is alert, awake, not in acute distress.  Elderly male lying in bed.  Looks chronically ill. Cardiovascular: rate controlled, S1/S2 + Respiratory: bilateral decreased breath sounds at bases Abdominal: Soft, NT, ND, bowel sounds + Extremities: no edema, no cyanosis    The results of significant diagnostics from this hospitalization (including imaging, microbiology, ancillary and laboratory) are listed below for reference.     Microbiology: Recent Results (from the past 240 hour(s))  Respiratory Panel by RT PCR (Flu A&B, Covid) - Nasopharyngeal Swab     Status: None   Collection Time: 11/25/19  4:31 PM   Specimen: Nasopharyngeal Swab  Result Value Ref Range Status   SARS Coronavirus 2 by RT PCR  NEGATIVE NEGATIVE Final    Comment: (NOTE) SARS-CoV-2 target nucleic acids are NOT DETECTED. The SARS-CoV-2 RNA is generally detectable in upper respiratoy specimens during the acute phase of infection. The lowest concentration of SARS-CoV-2 viral copies this assay can detect is 131 copies/mL. A negative result does not preclude SARS-Cov-2 infection and should not be used as the sole basis for treatment or other patient management decisions. A negative result may occur with  improper specimen collection/handling, submission of specimen other than nasopharyngeal swab, presence of viral mutation(s) within the areas targeted by this assay, and inadequate number of viral copies (<131 copies/mL). A negative result must be combined with clinical observations, patient history, and epidemiological information. The expected result is Negative. Fact Sheet for Patients:  PinkCheek.be Fact Sheet for Healthcare Providers:  GravelBags.it This test is not yet ap proved or cleared by the [place] FDA and  has been authorized for detection and/or diagnosis of SARS-CoV-2 by FDA under an Emergency Use Authorization (EUA). This EUA will remain  in effect (meaning this test can be used) for the duration of the COVID-19 declaration under Section 564(b)(1) of the Act, 21 U.S.C. section 360bbb-3(b)(1), unless the authorization is terminated or revoked sooner.    Influenza A by PCR NEGATIVE NEGATIVE Final   Influenza B by PCR NEGATIVE NEGATIVE Final    Comment: (NOTE) The Xpert Xpress SARS-CoV-2/FLU/RSV assay is intended as an aid in  the diagnosis of influenza from Nasopharyngeal swab specimens and  should not be used as a sole basis for treatment. Nasal washings and  aspirates  are unacceptable for Xpert Xpress SARS-CoV-2/FLU/RSV  testing. Fact Sheet for Patients: PinkCheek.be Fact Sheet for Healthcare  Providers: GravelBags.it This test is not yet approved or cleared by the [place] FDA and  has been authorized for detection and/or diagnosis of SARS-CoV-2 by  FDA under an Emergency Use Authorization (EUA). This EUA will remain  in effect (meaning this test can be used) for the duration of the  Covid-19 declaration under Section 564(b)(1) of the Act, 21  U.S.C. section 360bbb-3(b)(1), unless the authorization is  terminated or revoked. Performed at [facility], [place] [address] [place], [place] [address]   Culture, blood (routine x 2)     Status: None (Preliminary result)   Collection Time: 11/26/19 12:50 AM   Specimen: BLOOD  Result Value Ref Range Status   Specimen Description BLOOD RIGHT ANTECUBITAL  Final   Special Requests   Final    BOTTLES DRAWN AEROBIC AND ANAEROBIC Blood Culture adequate volume   Culture   Final    NO GROWTH 1 DAY Performed at [facility], [place] [address] [place], [place] [address]    Report Status PENDING  Incomplete  Culture, blood (routine x 2)     Status: None (Preliminary result)   Collection Time: 11/26/19 12:59 AM   Specimen: BLOOD  Result Value Ref Range Status   Specimen Description BLOOD LEFT ANTECUBITAL  Final   Special Requests   Final    BOTTLES DRAWN AEROBIC AND ANAEROBIC Blood Culture adequate volume   Culture   Final    NO GROWTH 1 DAY Performed at [facility], [place] [address] [place], [place] [address]    Report Status PENDING  Incomplete     Labs: BNP (last 3 results) No results for input(s): BNP in the last 8760 hours. Basic Metabolic Panel: Recent Labs  Lab 11/25/19 1413 11/25/19 1417  NA 134* 134*  K 3.9 4.0  CL 100 100  CO2 24  --   GLUCOSE 100* 99  BUN 18 20  CREATININE 0.84 0.70  CALCIUM 8.7*  --    Liver Function Tests: Recent Labs  Lab 11/25/19 1413  AST 21  ALT 9  ALKPHOS 71  BILITOT 0.3  PROT 6.4*  ALBUMIN 3.1*   No results for input(s): LIPASE,  AMYLASE in the last 168 hours. No results for input(s): AMMONIA in the last 168 hours. CBC: Recent Labs  Lab 11/25/19 1413 11/25/19 1417 11/27/19 0333  WBC 4.4  --  3.7*  NEUTROABS 2.6  --   --   HGB 10.8* 10.5* 10.9*  HCT 32.3* 31.0* 31.9*  MCV 99.1  --  95.2  PLT 60*  --  59*   Cardiac Enzymes: No results for input(s): CKTOTAL, CKMB, CKMBINDEX, TROPONINI in the last 168 hours. BNP: Invalid input(s): POCBNP CBG: Recent Labs  Lab 11/25/19 1413  GLUCAP 94   D-Dimer No results for input(s): DDIMER in the last 72 hours. Hgb A1c No results for input(s): HGBA1C in the last 72 hours. Lipid Profile No results for input(s): CHOL, HDL, LDLCALC, TRIG, CHOLHDL, LDLDIRECT in the last 72 hours. Thyroid function studies No results for input(s): TSH, T4TOTAL, T3FREE, THYROIDAB in the last 72 hours.  Invalid input(s): FREET3 Anemia work up No results for input(s): VITAMINB12, FOLATE, FERRITIN, TIBC, IRON, RETICCTPCT in the last 72 hours. Urinalysis    Component Value Date/Time   COLORURINE YELLOW 11/15/2019 0930   APPEARANCEUR HAZY (A) 11/15/2019 0930   LABSPEC 1.028 11/15/2019 0930  PHURINE 7.0 11/15/2019 0930   GLUCOSEU NEGATIVE 11/15/2019 0930   HGBUR LARGE (A) 11/15/2019 0930   BILIRUBINUR NEGATIVE 11/15/2019 0930   BILIRUBINUR n 10/15/2015 0939   KETONESUR NEGATIVE 11/15/2019 0930   PROTEINUR NEGATIVE 11/15/2019 0930   UROBILINOGEN 0.2 10/15/2015 0939   UROBILINOGEN 0.2 06/23/2015 1443   NITRITE NEGATIVE 11/15/2019 0930   LEUKOCYTESUR NEGATIVE 11/15/2019 0930   Sepsis Labs Invalid input(s): PROCALCITONIN,  WBC,  LACTICIDVEN Microbiology Recent Results (from the past 240 hour(s))  Respiratory Panel by RT PCR (Flu A&B, Covid) - Nasopharyngeal Swab     Status: None   Collection Time: 11/25/19  4:31 PM   Specimen: Nasopharyngeal Swab  Result Value Ref Range Status   SARS Coronavirus 2 by RT PCR NEGATIVE NEGATIVE Final    Comment: (NOTE) SARS-CoV-2 target nucleic  acids are NOT DETECTED. The SARS-CoV-2 RNA is generally detectable in upper respiratoy specimens during the acute phase of infection. The lowest concentration of SARS-CoV-2 viral copies this assay can detect is 131 copies/mL. A negative result does not preclude SARS-Cov-2 infection and should not be used as the sole basis for treatment or other patient management decisions. A negative result may occur with  improper specimen collection/handling, submission of specimen other than nasopharyngeal swab, presence of viral mutation(s) within the areas targeted by this assay, and inadequate number of viral copies (<131 copies/mL). A negative result must be combined with clinical observations, patient history, and epidemiological information. The expected result is Negative. Fact Sheet for Patients:  PinkCheek.be Fact Sheet for Healthcare Providers:  GravelBags.it This test is not yet ap proved or cleared by the [place] FDA and  has been authorized for detection and/or diagnosis of SARS-CoV-2 by FDA under an Emergency Use Authorization (EUA). This EUA will remain  in effect (meaning this test can be used) for the duration of the COVID-19 declaration under Section 564(b)(1) of the Act, 21 U.S.C. section 360bbb-3(b)(1), unless the authorization is terminated or revoked sooner.    Influenza A by PCR NEGATIVE NEGATIVE Final   Influenza B by PCR NEGATIVE NEGATIVE Final    Comment: (NOTE) The Xpert Xpress SARS-CoV-2/FLU/RSV assay is intended as an aid in  the diagnosis of influenza from Nasopharyngeal swab specimens and  should not be used as a sole basis for treatment. Nasal washings and  aspirates are unacceptable for Xpert Xpress SARS-CoV-2/FLU/RSV  testing. Fact Sheet for Patients: PinkCheek.be Fact Sheet for Healthcare Providers: GravelBags.it This test is not yet  approved or cleared by the [place] FDA and  has been authorized for detection and/or diagnosis of SARS-CoV-2 by  FDA under an Emergency Use Authorization (EUA). This EUA will remain  in effect (meaning this test can be used) for the duration of the  Covid-19 declaration under Section 564(b)(1) of the Act, 21  U.S.C. section 360bbb-3(b)(1), unless the authorization is  terminated or revoked. Performed at [facility], [place] [address] [place], [place] [address]   Culture, blood (routine x 2)     Status: None (Preliminary result)   Collection Time: 11/26/19 12:50 AM   Specimen: BLOOD  Result Value Ref Range Status   Specimen Description BLOOD RIGHT ANTECUBITAL  Final   Special Requests   Final    BOTTLES DRAWN AEROBIC AND ANAEROBIC Blood Culture adequate volume   Culture   Final    NO GROWTH 1 DAY Performed at [facility], [place] [address], [place], [place] [address]    Report Status PENDING  Incomplete  Culture, blood (routine x 2)     Status: None (Preliminary result)   Collection Time: 11/26/19 12:59 AM   Specimen: BLOOD  Result Value Ref Range Status   Specimen Description BLOOD LEFT ANTECUBITAL  Final   Special Requests   Final    BOTTLES DRAWN AEROBIC AND ANAEROBIC Blood Culture adequate volume   Culture   Final    NO GROWTH 1 DAY Performed at [facility], [address] [place], [place] [address]    Report Status PENDING  Incomplete     Time coordinating discharge: 35 minutes  SIGNED:   Aline August, MD  [facility] 11/27/2019, 11:36 AM

## 2019-11-27 NOTE — Plan of Care (Signed)
  Problem: Education: Goal: Knowledge of disease or condition will improve Outcome: Progressing Goal: Knowledge of secondary prevention will improve Outcome: Progressing Goal: Knowledge of patient specific risk factors addressed and post discharge goals established will improve Outcome: Progressing Goal: Individualized Educational Video(s) Outcome: Progressing   Problem: Coping: Goal: Will verbalize positive feelings about self Outcome: Progressing Goal: Will identify appropriate support needs Outcome: Progressing   Problem: Health Behavior/Discharge Planning: Goal: Ability to manage health-related needs will improve Outcome: Progressing   Problem: Self-Care: Goal: Ability to participate in self-care as condition permits will improve Outcome: Progressing Goal: Verbalization of feelings and concerns over difficulty with self-care will improve Outcome: Progressing Goal: Ability to communicate needs accurately will improve Outcome: Progressing   Problem: Nutrition: Goal: Risk of aspiration will decrease Outcome: Progressing Goal: Dietary intake will improve Outcome: Progressing   Problem: Ischemic Stroke/TIA Tissue Perfusion: Goal: Complications of ischemic stroke/TIA will be minimized Outcome: Progressing   Problem: Education: Goal: Knowledge of General Education information will improve Description: Including pain rating scale, medication(s)/side effects and non-pharmacologic comfort measures Outcome: Progressing   Problem: Health Behavior/Discharge Planning: Goal: Ability to manage health-related needs will improve Outcome: Progressing   Problem: Clinical Measurements: Goal: Ability to maintain clinical measurements within normal limits will improve Outcome: Progressing Goal: Will remain free from infection Outcome: Progressing Goal: Diagnostic test results will improve Outcome: Progressing Goal: Respiratory complications will improve Outcome: Progressing Goal:  Cardiovascular complication will be avoided Outcome: Progressing   Problem: Activity: Goal: Risk for activity intolerance will decrease Outcome: Progressing   Problem: Nutrition: Goal: Adequate nutrition will be maintained Outcome: Progressing   Problem: Coping: Goal: Level of anxiety will decrease Outcome: Progressing   Problem: Elimination: Goal: Will not experience complications related to bowel motility Outcome: Progressing Goal: Will not experience complications related to urinary retention Outcome: Progressing   Problem: Pain Managment: Goal: General experience of comfort will improve Outcome: Progressing   Problem: Safety: Goal: Ability to remain free from injury will improve Outcome: Progressing

## 2019-11-27 NOTE — Progress Notes (Signed)
Physical Therapy Treatment Patient Details Name: Corey Oneill MRN: [id number] DOB: 1936-01-05 Today's Date: 11/27/2019    History of Present Illness Corey Oneill is a 84 y.o. male with medical history significant of hearing loss; metastatic prostate CA with resultant radiation proctitis; chronic ITP; s/p TAVR with recurrent enterococcal bacteremia; grade 1 diastolic dysfunction; HLD; CAD presenting with aphasia and R-sided weakness.CT head, CTA head and neck as well as CT perfusion did not reveal any abnormalities. Evaluated by neuro who thought possible partial complex seizure as he has hx of same. LTM EEG ordered.    PT Comments    Pt with regression towards physical therapy goals today, with new functional deficits and requiring increased assist for mobility. Requiring moderate assist for transfers and limited room ambulation using a walker. Pt demonstrates lateral lean in standing, left knee buckle, and decreased ability to weight bear through left lower extremity. Pt fatiguing easily, stating, "I feel like my left leg is about to give out." Upon further examination, pt still able to hold left lower extremity up against gravity; seems to be more of a functional deficit in nature. RN/MD notified. In light of these recent developments, may need CIR prior to discharge home to maximize functional mobility and decrease caregiver burden. Pt presents as a high fall risk based on balance impairments, decreased gait speed and safety awareness.    Follow Up Recommendations  Supervision/Assistance - 24 hour;CIR     Equipment Recommendations  None recommended by PT    Recommendations for Other Services       Precautions / Restrictions Precautions Precautions: Fall Restrictions Weight Bearing Restrictions: No    Mobility  Bed Mobility Overal bed mobility: Needs Assistance Bed Mobility: Supine to Sit     Supine to sit: Min assist     General bed mobility comments: Increased time,  posterior lean initially requiring minA to correct to upright  Transfers Overall transfer level: Needs assistance Equipment used: Rolling walker (2 wheeled);None Transfers: Sit to/from Stand Sit to Stand: Mod assist         General transfer comment: Pt requiring modA for execution to upright as he had initial heavy lateral lean and difficulty transitioning left hand to walker.  Ambulation/Gait Ambulation/Gait assistance: Mod assist Gait Distance (Feet): 15 Feet Assistive device: Rolling walker (2 wheeled) Gait Pattern/deviations: Step-through pattern;Decreased stride length;Narrow base of support;Decreased weight shift to left     General Gait Details: Pt requiring modA due to lateral lean, decreased LLE weightbearing. Also with increased difficulty with obstacle negotiation and turning, requiring max cues for keeping hands on walker throughout transitions and for optimal walker positioning   Stairs             Wheelchair Mobility    Modified Rankin (Stroke Patients Only) Modified Rankin (Stroke Patients Only) Pre-Morbid Rankin Score: Moderate disability Modified Rankin: Moderately severe disability     Balance Overall balance assessment: Needs assistance Sitting-balance support: Feet unsupported;No upper extremity supported Sitting balance-Leahy Scale: Poor Sitting balance - Comments: posterior lean   Standing balance support: Bilateral upper extremity supported;Single extremity supported Standing balance-Leahy Scale: Poor                              Cognition Arousal/Alertness: Awake/alert Behavior During Therapy: WFL for tasks assessed/performed Overall Cognitive Status: Impaired/Different from baseline Area of Impairment: Memory;Safety/judgement;Problem solving  Memory: Decreased short-term memory;Decreased recall of precautions   Safety/Judgement: Decreased awareness of safety;Decreased awareness of deficits    Problem Solving: Difficulty sequencing;Requires verbal cues General Comments: Requiring max cues for problem solving, sequencing, safety.      Exercises      General Comments        Pertinent Vitals/Pain Pain Assessment: Faces Faces Pain Scale: No hurt    Home Living                      Prior Function            PT Goals (current goals can now be found in the care plan section) Acute Rehab PT Goals Patient Stated Goal: Goal of pt family per chart review is to return home PT Goal Formulation: With patient Time For Goal Achievement: 12/10/19 Potential to Achieve Goals: Good    Frequency    Min 3X/week      PT Plan Discharge plan needs to be updated    Co-evaluation              AM-PAC PT "6 Clicks" Mobility   Outcome Measure  Help needed turning from your back to your side while in a flat bed without using bedrails?: None Help needed moving from lying on your back to sitting on the side of a flat bed without using bedrails?: A Little Help needed moving to and from a bed to a chair (including a wheelchair)?: A Lot Help needed standing up from a chair using your arms (e.g., wheelchair or bedside chair)?: A Lot Help needed to walk in hospital room?: A Little Help needed climbing 3-5 steps with a railing? : A Lot 6 Click Score: 16    End of Session Equipment Utilized During Treatment: Gait belt Activity Tolerance: Patient tolerated treatment well Patient left: with call bell/phone within reach;in chair;with chair alarm set Nurse Communication: Mobility status PT Visit Diagnosis: Other abnormalities of gait and mobility (R26.89)     Time: [id number] PT Time Calculation (min) (ACUTE ONLY): 19 min  Charges:  $Therapeutic Activity: 8-22 mins                     Ellamae Sia, PT, DPT Acute Rehabilitation Services Pager [phone] Office [phone]    Willy Eddy 11/27/2019, 1:14 PM

## 2019-11-27 NOTE — Progress Notes (Signed)
vLTM EEG complete. Has slight redness/breakdown from electrode site Fz and F3

## 2019-11-27 NOTE — Consult Note (Signed)
Physical Medicine and Rehabilitation Consult  Reason for Consult: Encephalopathy Referring Physician: Dr. Starla Link   HPI: Corey Oneill is a 84 y.o. male with history of prostate cancer with mets to bone, BPPV, bullous pemphigoid, chronic ITP, multiple admissions in the past few months with enterococcal bacteremia 09/2019 and admission 1/1-1/2 for starring off episodes with word finding difficulty felt to be due to TIA who was admitted on 11/25/19 with right sided weakness, confusion and speech difficulty. MRI brain negative for acute abnormality and showed moderate advanced cerebral atrophy. CTA/perfsuion negative. Neurology questioned partial complex seizure and EEG showed moderate diffuse encephalopathy.  LT-EEG showed moderate diffuse encephalopathy without seizures. Therapy evaluation showed confusion with poor safety awareness, fatigue and regression today. CIR recommended due to functional decline.     Pt seen this afternoon- denied all pain; said he feels like he's exactly the same as was at home in terms of strength, ability to move around- his only issue is that lately, in the last few months, he's having more issues getting in and out of bed- he has a regular bed, not hospital bed- and sounds like could benefit from hospital bed.   Pt was extremely clear, he didn't want to wait until tomorrow to possibly come to inpt rehab- he would rather go home- actually now, if at all possible, and do H/H at home- he felt the knee buckling that occurred with PT earlier was pretty close to what he deals with at home, and notes that he "catches himself" if the knee is "weak". He admits the knee buckling was a little more than normal, after prolonged discussion, but still thinks he can manage at home.   Review of Systems  Constitutional: Negative for chills and fever.  HENT: Positive for hearing loss.   Eyes: Negative for blurred vision and double vision.  Respiratory: Negative for cough and  shortness of breath.   Cardiovascular: Negative for chest pain and palpitations.  Gastrointestinal: Negative for constipation, heartburn and nausea.       Appetite fair  Musculoskeletal: Negative for myalgias and neck pain.  Skin: Negative for rash.  Neurological: Positive for speech change and weakness. Negative for dizziness and headaches.  Psychiatric/Behavioral: The patient does not have insomnia.   All other systems reviewed and are negative.    Past Medical History:  Diagnosis Date  . Abdominal aortic atherosclerosis ([place])   . Anginal pain ([place])    last noted 08/17/19  . Bone cancer ([place])    Left hip  . BPPV (benign paroxysmal positional vertigo)   . Bullous pemphigoid   . CAD (coronary artery disease)    a.  LHC 8/16: Mid to distal LAD 30%, OM1 40%, proximal mid RCA 40%, distal RCA 60% >> FFR 0.69  >> PCI: 3 x 15 mm Resolute DES  . Depression   . Esophageal reflux   . Grade I diastolic dysfunction 123456   Noted on ECHO   . H/O blood clots    "get them in my stool and urine" (11/12/2015)  . Heart murmur   . Hepatic lesion 12/08/2015   Stable 8 mm right hepatic lesion  . History of aortic stenosis    a. peak to peak gradient by LHC 8/16:  37 mmHg (moderately severe)   . History of appendicitis 2016  . History of blood transfusion 12/2018  . History of herpes labialis   . History of ITP    2018, thrombocytopenia  . History of kidney  stones   . Hx of radiation therapy 04/14/13-06/09/13   prostate 7800 cGy, 40 sessions, seminal vesicles 5600 cGy 40 sessions  . Hydrocele 2006   Small  . Hyperlipidemia   . Insomnia    resolved  . LVH (left ventricular hypertrophy)    Moderate, noted on ECHO 09/2018  . Malignant melanoma in situ ([place]) 09/04/2016   Right neck and chest  . Metastasis from malignant neoplasm of prostate ([place]) 02/25/2019  . Prostate cancer ([place]) dx'd 2014  . Radiation proctitis   . Renal mass 2017   Bilateral renal masses  . S/P skin biopsy  04/09/2019   subepidermal cell poor vesicle , Linear IGG and C3 the basement membrane  . Suprapubic catheter ([place])   . Wears hearing aid in both ears     Past Surgical History:  Procedure Laterality Date  . CARDIAC CATHETERIZATION N/A 04/21/2015   Procedure: Right/Left Heart Cath and Coronary Angiography;  Surgeon: [name], MD;  Location: [facility];  Service: Cardiovascular;  Laterality: N/A;  . CARDIAC CATHETERIZATION N/A 06/18/2015   Procedure: Intravascular Pressure Wire/FFR Study;  Surgeon: [name], MD;  Location: [facility];  Service: Cardiovascular;  Laterality: N/A;  . CARDIAC CATHETERIZATION N/A 06/18/2015   Procedure: Coronary Stent Intervention;  Surgeon: [name], MD;  Location: [facility];  Service: Cardiovascular;  Laterality: N/A;  . CARDIAC CATHETERIZATION N/A 06/18/2015   Procedure: Right/Left Heart Cath and Coronary Angiography;  Surgeon: [name], MD;  Location: [facility];  Service: Cardiovascular;  Laterality: N/A;  . CARDIAC CATHETERIZATION N/A 06/23/2015   Procedure: Left Heart Cath and Cors/Grafts Angiography;  Surgeon: [name], MD;  Location: [facility];  Service: Cardiovascular;  Laterality: N/A;  . CARDIAC CATHETERIZATION N/A 01/17/2016   Procedure: Right/Left Heart Cath and Coronary Angiography;  Surgeon: [name], MD;  Location: [facility];  Service: Cardiovascular;  Laterality: N/A;  . CATARACT EXTRACTION, BILATERAL    . COLONOSCOPY  06/26/2017  . CYSTOSCOPY WITH BIOPSY N/A 07/30/2018   Procedure: CYSTOSCOPY WITH BIOPSY/FULGURATION, CYSTOLTHOLAPAXY;  Surgeon: [name], MD;  [facility];  Service: Urology;  Laterality: N/A;  . CYSTOSCOPY WITH URETHRAL DILATATION N/A 08/18/2019   Procedure: CYSTOSCOPY WITH URETHRAL DILATATION USING BALLOON OR LASER/ SPURO PUBIC TUBE CHANGE LASER EXCISION OF URETHRAL STRICTURE RETROGRADE URETHROGRAM ANTEGRADE CYSTOGRAM;  Surgeon:  [name], MD;  Location: [facility];  Service: Urology;  Laterality: N/A;  . FRACTURE SURGERY    . INGUINAL HERNIA REPAIR     patient does not remember this procedure  . LAPAROSCOPIC APPENDECTOMY N/A 11/16/2015   Procedure: APPENDECTOMY LAPAROSCOPIC;  Surgeon: [name], MD;  Location: [facility];  Service: General;  Laterality: N/A;  . LEFT HEART CATH AND CORONARY ANGIOGRAPHY N/A 12/26/2016   Procedure: Left Heart Cath and Coronary Angiography;  Surgeon: Troy Sine, MD;  Location: [facility];  Service: Cardiovascular;  Laterality: N/A;  . MELANOMA EXCISION Right 10/24/2016   Procedure: WIDE EXCISION MELANOMA RIGHT NECK AND RIGHT CHEST TIMES 2;  Surgeon: [name], MD;  Location: [facility];  Service: General;  Laterality: Right;  right medial and lateral lesion of chest and right neck  . NASAL FRACTURE SURGERY     "broken years ago; several ORs to correct it"  . NASAL SEPTUM SURGERY    . PROSTATE BIOPSY  2014   "needle biopsy"  . TEE WITHOUT CARDIOVERSION N/A 02/15/2016  Procedure: TRANSESOPHAGEAL ECHOCARDIOGRAM (TEE);  Surgeon: [name], MD;  Location: [facility];  Service: Open Heart Surgery;  Laterality: N/A;  . TEE WITHOUT CARDIOVERSION N/A 08/26/2019   Procedure: TRANSESOPHAGEAL ECHOCARDIOGRAM (TEE);  Surgeon: Pixie Casino, MD;  Location: [facility];  Service: Cardiovascular;  Laterality: N/A;  . TONSILLECTOMY    . TRANSCATHETER AORTIC VALVE REPLACEMENT, TRANSFEMORAL  02/15/2016  . TRANSCATHETER AORTIC VALVE REPLACEMENT, TRANSFEMORAL N/A 02/15/2016   Procedure: TRANSCATHETER AORTIC VALVE REPLACEMENT, TRANSFEMORAL;  Surgeon: [name], MD;  Location: [facility];  Service: Open Heart Surgery;  Laterality: N/A;  . TRANSURETHRAL RESECTION OF PROSTATE  12/23/2018   Procedure: CYSTOSCOPY WITH  BLADDER BIOPSY WITH FULGERATION/ TRANSURETHRAL RESECTION PROSTATE  AND PROSTATE BIOPSY;  Surgeon: [name], MD;  [facility];  Service: Urology;;     Family History  Problem Relation Age of Onset  . Brain cancer Father   . Lymphoma Mother   . Depression Daughter     Social History:  Married. Has declined in past few months and palliative care following at home. Per reports that he has quit smoking. His smoking use included cigarettes. He smoked 0.00 packs per day. He has never used smokeless tobacco. He reports current alcohol use. He reports that he does not use drugs.  Lives in 2 story house in [place] with 1-2 STE with wife- there is a bedroom on first floor if necessary.    Allergies  Allergen Reactions  . Prednisone Other (See Comments)    Makes the patient depressed    Medications Prior to Admission  Medication Sig Dispense Refill  . amoxicillin (AMOXIL) 500 MG tablet Take 1 tablet (500 mg total) by mouth 2 (two) times daily. 60 tablet 11  . aspirin EC 81 MG tablet Take 81 mg by mouth every evening.     . Calcium-Phosphorus-Vitamin D (CITRACAL +D3 PO) Take 1 tablet by mouth at bedtime.    . carvedilol (COREG) 3.125 MG tablet TAKE 1 TABLET BY MOUTH TWICE DAILY. (Patient taking differently: Take 3.125 mg by mouth 2 (two) times daily. ) 60 tablet 2  . doxycycline (MONODOX) 100 MG capsule Take 100 mg by mouth 2 (two) times daily.     . DULoxetine (CYMBALTA) 30 MG capsule Take 30 mg by mouth daily.    . enzalutamide (XTANDI) 40 MG capsule Take 160 mg by mouth daily with supper.     . ferrous sulfate (FEROSUL) 325 (65 FE) MG tablet Take 325 mg by mouth daily with breakfast.    . folic acid (FOLVITE) 1 MG tablet Take 1 mg by mouth See admin instructions. Take 1 mg by mouth once a day on Sun/Mon/Wed/Thurs/Fri/Sat (not on Tuesdays)    . isosorbide mononitrate (IMDUR) 30 MG 24 hr tablet Take 1 tablet (30 mg total) by mouth daily. 30 tablet 11  . methotrexate (RHEUMATREX) 2.5 MG tablet Take 10 mg by mouth every Tuesday.     . MULTIPLE VITAMIN PO Take 1 tablet by mouth daily.    . nitroGLYCERIN (NITROSTAT) 0.4 MG SL tablet 1 TAB  UNDER TONGUE AS NEEDED FOR CHEST PAIN. MAY REPEAT EVERY 5 MIN FOR A TOTAL OF 3 DOSES. (Patient taking differently: Place 0.4 mg under the tongue every 5 (five) minutes as needed for chest pain. ) 25 tablet 2  . rosuvastatin (CRESTOR) 10 MG tablet TAKE 1 TABLET ONCE DAILY. (Patient taking differently: Take 10 mg by mouth daily. ) 90 tablet 0  . acetaminophen (TYLENOL) 500 MG tablet Take 500 mg by  mouth every 6 (six) hours as needed for moderate pain.       Home: Home Living Family/patient expects to be discharged to:: Private residence Living Arrangements: Spouse/significant other Available Help at Discharge: Family, Available 24 hours/day Type of Home: Other(Comment)(townhouse) Home Access: Stairs to enter [employer] of Steps: 2 Entrance Stairs-Rails: Right Home Layout: One level Bathroom Shower/Tub: Chiropodist: Handicapped height Home Equipment: Environmental consultant - 2 wheels, [place] - single point  Functional History: Prior Function Level of Independence: Needs assistance Gait / Transfers Assistance Needed: uses walker ADL's / Homemaking Assistance Needed: pt states he is independent ADL's; dependent IADL's Functional Status:  Mobility: Bed Mobility Overal bed mobility: Needs Assistance Bed Mobility: Supine to Sit Supine to sit: Min assist General bed mobility comments: Increased time, posterior lean initially requiring minA to correct to upright Transfers Overall transfer level: Needs assistance Equipment used: Rolling walker (2 wheeled), None Transfers: Sit to/from Stand Sit to Stand: Mod assist General transfer comment: Pt requiring modA for execution to upright as he had initial heavy lateral lean and difficulty transitioning left hand to walker. Ambulation/Gait Ambulation/Gait assistance: Mod assist Gait Distance (Feet): 15 Feet Assistive device: Rolling walker (2 wheeled) Gait Pattern/deviations: Step-through pattern, Decreased stride length, Narrow  base of support, Decreased weight shift to left General Gait Details: Pt requiring modA due to lateral lean, decreased LLE weightbearing. Also with increased difficulty with obstacle negotiation and turning, requiring max cues for keeping hands on walker throughout transitions and for optimal walker positioning    ADL: ADL Overall ADL's : Needs assistance/impaired Eating/Feeding: Set up, Sitting Grooming: Min guard, Minimal assistance, Standing Upper Body Bathing: Minimal assistance, Sitting Lower Body Bathing: Moderate assistance, Sit to/from stand Upper Body Dressing : Minimal assistance, Sitting Lower Body Dressing: Moderate assistance, Sit to/from stand, Sitting/lateral leans Lower Body Dressing Details (indicate cue type and reason): to don socks Toilet Transfer: Minimal assistance, Comfort height toilet, Ambulation, RW, Grab bars Toileting- Clothing Manipulation and Hygiene: Total assistance, Sit to/from stand Toileting - Clothing Manipulation Details (indicate cue type and reason): pt incontinent of bowels during session, requiring total A for peri care to clean Tub/ Shower Transfer: Minimal assistance, Ambulation, Shower seat, Rolling walker Functional mobility during ADLs: Minimal assistance, Rolling walker, Cueing for safety, Cueing for sequencing General ADL Comments: pt limited by cognitive deficits, decreased activity tolerance, and weakness for BADL  Cognition: Cognition Overall Cognitive Status: Impaired/Different from baseline Orientation Level: Oriented to person, Oriented to place Cognition Arousal/Alertness: Awake/alert Behavior During Therapy: WFL for tasks assessed/performed Overall Cognitive Status: Impaired/Different from baseline Area of Impairment: Memory, Safety/judgement, Problem solving Memory: Decreased short-term memory, Decreased recall of precautions Safety/Judgement: Decreased awareness of safety, Decreased awareness of deficits Problem Solving:  Difficulty sequencing, Requires verbal cues General Comments: Requiring max cues for problem solving, sequencing, safety.  Blood pressure (!) 94/54, pulse 72, temperature (!) 97.4 F (36.3 C), temperature source Oral, resp. rate 16, SpO2 99 %. Physical Exam  Nursing note and vitals reviewed. Constitutional: He appears well-developed and well-nourished.  Slightly frail appearing male, sitting up at bedside chair, without TV on, not reading or listening to music- was awake, NAD  HENT:  Head: Normocephalic and atraumatic.  Nose: Nose normal.  Mouth/Throat: No oropharyngeal exudate.  Has healing spots on head- from prior abrasions in different stages of healing Face symmetrical; facial sensation intact  Eyes: Pupils are equal, round, and reactive to light. EOM are normal. Right eye exhibits no discharge. Left eye exhibits no discharge.  Neck:  No tracheal deviation present.  Cardiovascular:  RRR  Respiratory:  CTA B/L- no W/R/R  GI:  Soft, NT, ND, (+)BS  Musculoskeletal:        General: No edema.     Cervical back: Normal range of motion and neck supple.     Comments:  Strength 5/5 in UEs and LEs- extremely strong for his age, esp.    Neurological: He is alert. No cranial nerve deficit.  Oriented to self and place. Word finding deficits with poor awareness of deficits. Occasional inappropriate answers.   Intact to light touch in all 4 extremities  Skin:  Has L forearm IV- some mild erythema questionable mild surrounding swelling Skin spots on head as per HEENT No significant LE edema- wearing yellow socks Didn't look for bullae  Psychiatric:  Frustrated- repeated 2x that wanted to go home.     Results for orders placed or performed during the hospital encounter of 11/25/19 (from the past 24 hour(s))  CBC     Status: Abnormal   Collection Time: 11/27/19  3:33 AM  Result Value Ref Range   WBC 3.7 (L) 4.0 - 10.5 K/uL   RBC 3.35 (L) 4.22 - 5.81 MIL/uL   Hemoglobin 10.9 (L) 13.0  - 17.0 g/dL   HCT 31.9 (L) 39.0 - 52.0 %   MCV 95.2 80.0 - 100.0 fL   MCH 32.5 26.0 - 34.0 pg   MCHC 34.2 30.0 - 36.0 g/dL   RDW 14.3 11.5 - 15.5 %   Platelets 59 (L) 150 - 400 K/uL   nRBC 0.0 0.0 - 0.2 %   EEG  Result Date: 11/25/2019 Lora Havens, MD     11/25/2019  5:42 PM Patient Name: Corey Oneill MRN: [id number] Epilepsy Attending: Lora Havens Referring Physician/Provider: Dr Kerney Elbe Date: 11/25/2019 Duration: 23.41 mins Patient history: 84 y.o. male with h/o "staring spells" presenting after acute onset of aphasia, right facial droop and right sided weakness at home. EEG to evaluate for seizure. Level of alertness: awake AEDs during EEG study: None Technical aspects: This EEG study was done with scalp electrodes positioned according to the 10-20 International system of electrode placement. Electrical activity was acquired at a sampling rate of 500Hz  and reviewed with a high frequency filter of 70Hz  and a low frequency filter of 1Hz . EEG data were recorded continuously and digitally stored. DESCRIPTION: No clear posterior dominant rhythm was seen. EEG showed continuous generalized 3-5hz  theta-delta slowing as well as intermittent rhythmic generalized 2-3Hz  delta slowing.  Hyperventilation and photic stimulation were not performed. ABNORMALITY - Continuous slow, generalized - Intermittent rhythmic slow, generalized IMPRESSION: This study is suggestive of moderate diffuse encephalopathy, non specific to etiology.  No seizures or epileptiform discharges were seen throughout the recording. Lora Havens   CT Code Stroke CTA Head W/WO contrast  Result Date: 11/25/2019 CLINICAL DATA:  Neuro deficit, acute, stroke suspected. EXAM: CT ANGIOGRAPHY HEAD AND NECK CT PERFUSION BRAIN TECHNIQUE: Multidetector CT imaging of the head and neck was performed using the standard protocol during bolus administration of intravenous contrast. Multiplanar CT image reconstructions and MIPs were obtained  to evaluate the vascular anatomy. Carotid stenosis measurements (when applicable) are obtained utilizing NASCET criteria, using the distal internal carotid diameter as the denominator. Multiphase CT imaging of the brain was performed following IV bolus contrast injection. Subsequent parametric perfusion maps were calculated using RAPID software. CONTRAST:  185mL OMNIPAQUE IOHEXOL 350 MG/ML SOLN COMPARISON:  Noncontrast head CT performed earlier the same day,  CT angiogram head/neck 11/14/2019 FINDINGS: CTA NECK FINDINGS Aortic arch: Standard aortic branching. Scattered soft and calcified plaque within the visualized aortic arch and proximal major branch vessels of the neck. Right carotid system: CCA widely patent to the bifurcation. Mild atherosclerotic plaque within the carotid bifurcation and proximal ICA. No measurable stenosis of the proximal ICA relative to the more distal vessel. Left carotid system: CCA patent to the bifurcation. Soft and calcified plaque within the carotid bifurcation and proximal ICA. No measurable stenosis of the proximal ICA as compared to the more distal vessel. Vertebral arteries: The left vertebral artery is significantly dominant. The non dominant right vertebral artery is patent throughout the neck without significant stenosis (50% or greater). Mixed plaque results in unchanged moderate/severe ostial stenosis at the origin of the dominant left vertebral artery. Distal to this, the left vertebral artery is patent within the neck without significant stenosis (50% or greater). Skeleton: No acute bony abnormality. Cervical spondylosis with multilevel shallow posterior disc osteophytes, uncovertebral and facet hypertrophy. No high-grade bony spinal canal stenosis. Other neck: No neck mass or cervical lymphadenopathy. Subcentimeter right thyroid lobe nodule, not meeting consensus criteria for ultrasound follow-up. Upper chest: Ill-defined opacity within the posterior aspect of the partially  visualized right upper lobe suspicious for pneumonia. Review of the MIP images confirms the above findings CTA HEAD FINDINGS Anterior circulation: The intracranial internal carotid arteries are patent bilaterally with scattered calcified plaque but no more than mild stenosis. The right middle and anterior cerebral arteries are patent without proximal branch occlusion or high-grade proximal stenosis. The left middle cerebral artery is patent without proximal branch occlusion or high-grade proximal arterial stenosis identified. The left anterior cerebral artery is patent without significant proximal stenosis. Redemonstrated multifocal high-grade stenoses within distal left anterior cerebral artery branches (for instance as seen on series 6, image 58) (series 11, image 23). No intracranial aneurysm is identified. Posterior circulation: The non dominant intracranial right vertebral artery is patent and appears to terminate as the right PICA. The dominant intracranial left vertebral artery is patent without significant stenosis, as is the basilar artery. The bilateral posterior cerebral arteries are patent without significant proximal stenosis. Posterior communicating arteries are poorly delineated and may be hypoplastic or absent bilaterally. Venous sinuses: Within limitations of contrast timing, no convincing thrombus. Anatomic variants: As described Review of the MIP images confirms the above findings CT Brain Perfusion Findings: CBF (<30%) Volume: None Perfusion (Tmax>6.0s) volume: None Mismatch Volume: None Infarction Location: None These results were communicated to Dr. Cheral Marker At 3:33 pmon 1/12/2021by text page via the [facility] messaging system. IMPRESSION: CTA neck: 1. The bilateral common and internal carotid arteries are patent within the neck without significant stenosis. Mild plaque at the carotid bifurcations and within the proximal ICAs. 2. Significantly dominant left vertebral artery with redemonstrated  moderate/severe ostial stenosis. The vertebral arteries are otherwise patent within the neck without significant stenosis. 3. Ill-defined airspace opacity within the posterior aspect of the partially imaged right upper lobe suspicious for pneumonia. CTA head: 1. No intracranial large vessel occlusion or proximal high-grade arterial stenosis identified. 2. Redemonstrated multifocal high-grade stenoses within distal left anterior cerebral artery branches. 3. Calcified atherosclerosis within the intracranial ICAs, but no more than mild stenosis. CT perfusion head: 1. The perfusion software detects no core infarct. 2. The perfusion software detects no region of critically hypoperfused parenchyma utilizing a Tmax>6 seconds threshold. Electronically Signed   By: Kellie Simmering DO   On: 11/25/2019 15:31   CT Code Stroke  CTA Neck W/WO contrast  Result Date: 11/25/2019 CLINICAL DATA:  Neuro deficit, acute, stroke suspected. EXAM: CT ANGIOGRAPHY HEAD AND NECK CT PERFUSION BRAIN TECHNIQUE: Multidetector CT imaging of the head and neck was performed using the standard protocol during bolus administration of intravenous contrast. Multiplanar CT image reconstructions and MIPs were obtained to evaluate the vascular anatomy. Carotid stenosis measurements (when applicable) are obtained utilizing NASCET criteria, using the distal internal carotid diameter as the denominator. Multiphase CT imaging of the brain was performed following IV bolus contrast injection. Subsequent parametric perfusion maps were calculated using RAPID software. CONTRAST:  177mL OMNIPAQUE IOHEXOL 350 MG/ML SOLN COMPARISON:  Noncontrast head CT performed earlier the same day, CT angiogram head/neck 11/14/2019 FINDINGS: CTA NECK FINDINGS Aortic arch: Standard aortic branching. Scattered soft and calcified plaque within the visualized aortic arch and proximal major branch vessels of the neck. Right carotid system: CCA widely patent to the bifurcation. Mild  atherosclerotic plaque within the carotid bifurcation and proximal ICA. No measurable stenosis of the proximal ICA relative to the more distal vessel. Left carotid system: CCA patent to the bifurcation. Soft and calcified plaque within the carotid bifurcation and proximal ICA. No measurable stenosis of the proximal ICA as compared to the more distal vessel. Vertebral arteries: The left vertebral artery is significantly dominant. The non dominant right vertebral artery is patent throughout the neck without significant stenosis (50% or greater). Mixed plaque results in unchanged moderate/severe ostial stenosis at the origin of the dominant left vertebral artery. Distal to this, the left vertebral artery is patent within the neck without significant stenosis (50% or greater). Skeleton: No acute bony abnormality. Cervical spondylosis with multilevel shallow posterior disc osteophytes, uncovertebral and facet hypertrophy. No high-grade bony spinal canal stenosis. Other neck: No neck mass or cervical lymphadenopathy. Subcentimeter right thyroid lobe nodule, not meeting consensus criteria for ultrasound follow-up. Upper chest: Ill-defined opacity within the posterior aspect of the partially visualized right upper lobe suspicious for pneumonia. Review of the MIP images confirms the above findings CTA HEAD FINDINGS Anterior circulation: The intracranial internal carotid arteries are patent bilaterally with scattered calcified plaque but no more than mild stenosis. The right middle and anterior cerebral arteries are patent without proximal branch occlusion or high-grade proximal stenosis. The left middle cerebral artery is patent without proximal branch occlusion or high-grade proximal arterial stenosis identified. The left anterior cerebral artery is patent without significant proximal stenosis. Redemonstrated multifocal high-grade stenoses within distal left anterior cerebral artery branches (for instance as seen on series  6, image 58) (series 11, image 23). No intracranial aneurysm is identified. Posterior circulation: The non dominant intracranial right vertebral artery is patent and appears to terminate as the right PICA. The dominant intracranial left vertebral artery is patent without significant stenosis, as is the basilar artery. The bilateral posterior cerebral arteries are patent without significant proximal stenosis. Posterior communicating arteries are poorly delineated and may be hypoplastic or absent bilaterally. Venous sinuses: Within limitations of contrast timing, no convincing thrombus. Anatomic variants: As described Review of the MIP images confirms the above findings CT Brain Perfusion Findings: CBF (<30%) Volume: None Perfusion (Tmax>6.0s) volume: None Mismatch Volume: None Infarction Location: None These results were communicated to Dr. Cheral Marker At 3:33 pmon 1/12/2021by text page via the [facility] messaging system. IMPRESSION: CTA neck: 1. The bilateral common and internal carotid arteries are patent within the neck without significant stenosis. Mild plaque at the carotid bifurcations and within the proximal ICAs. 2. Significantly dominant left vertebral artery with redemonstrated  moderate/severe ostial stenosis. The vertebral arteries are otherwise patent within the neck without significant stenosis. 3. Ill-defined airspace opacity within the posterior aspect of the partially imaged right upper lobe suspicious for pneumonia. CTA head: 1. No intracranial large vessel occlusion or proximal high-grade arterial stenosis identified. 2. Redemonstrated multifocal high-grade stenoses within distal left anterior cerebral artery branches. 3. Calcified atherosclerosis within the intracranial ICAs, but no more than mild stenosis. CT perfusion head: 1. The perfusion software detects no core infarct. 2. The perfusion software detects no region of critically hypoperfused parenchyma utilizing a Tmax>6 seconds threshold.  Electronically Signed   By: Kellie Simmering DO   On: 11/25/2019 15:31   CT Code Stroke Cerebral Perfusion with contrast  Result Date: 11/25/2019 CLINICAL DATA:  Neuro deficit, acute, stroke suspected. EXAM: CT ANGIOGRAPHY HEAD AND NECK CT PERFUSION BRAIN TECHNIQUE: Multidetector CT imaging of the head and neck was performed using the standard protocol during bolus administration of intravenous contrast. Multiplanar CT image reconstructions and MIPs were obtained to evaluate the vascular anatomy. Carotid stenosis measurements (when applicable) are obtained utilizing NASCET criteria, using the distal internal carotid diameter as the denominator. Multiphase CT imaging of the brain was performed following IV bolus contrast injection. Subsequent parametric perfusion maps were calculated using RAPID software. CONTRAST:  19mL OMNIPAQUE IOHEXOL 350 MG/ML SOLN COMPARISON:  Noncontrast head CT performed earlier the same day, CT angiogram head/neck 11/14/2019 FINDINGS: CTA NECK FINDINGS Aortic arch: Standard aortic branching. Scattered soft and calcified plaque within the visualized aortic arch and proximal major branch vessels of the neck. Right carotid system: CCA widely patent to the bifurcation. Mild atherosclerotic plaque within the carotid bifurcation and proximal ICA. No measurable stenosis of the proximal ICA relative to the more distal vessel. Left carotid system: CCA patent to the bifurcation. Soft and calcified plaque within the carotid bifurcation and proximal ICA. No measurable stenosis of the proximal ICA as compared to the more distal vessel. Vertebral arteries: The left vertebral artery is significantly dominant. The non dominant right vertebral artery is patent throughout the neck without significant stenosis (50% or greater). Mixed plaque results in unchanged moderate/severe ostial stenosis at the origin of the dominant left vertebral artery. Distal to this, the left vertebral artery is patent within the  neck without significant stenosis (50% or greater). Skeleton: No acute bony abnormality. Cervical spondylosis with multilevel shallow posterior disc osteophytes, uncovertebral and facet hypertrophy. No high-grade bony spinal canal stenosis. Other neck: No neck mass or cervical lymphadenopathy. Subcentimeter right thyroid lobe nodule, not meeting consensus criteria for ultrasound follow-up. Upper chest: Ill-defined opacity within the posterior aspect of the partially visualized right upper lobe suspicious for pneumonia. Review of the MIP images confirms the above findings CTA HEAD FINDINGS Anterior circulation: The intracranial internal carotid arteries are patent bilaterally with scattered calcified plaque but no more than mild stenosis. The right middle and anterior cerebral arteries are patent without proximal branch occlusion or high-grade proximal stenosis. The left middle cerebral artery is patent without proximal branch occlusion or high-grade proximal arterial stenosis identified. The left anterior cerebral artery is patent without significant proximal stenosis. Redemonstrated multifocal high-grade stenoses within distal left anterior cerebral artery branches (for instance as seen on series 6, image 58) (series 11, image 23). No intracranial aneurysm is identified. Posterior circulation: The non dominant intracranial right vertebral artery is patent and appears to terminate as the right PICA. The dominant intracranial left vertebral artery is patent without significant stenosis, as is the basilar artery. The bilateral posterior  cerebral arteries are patent without significant proximal stenosis. Posterior communicating arteries are poorly delineated and may be hypoplastic or absent bilaterally. Venous sinuses: Within limitations of contrast timing, no convincing thrombus. Anatomic variants: As described Review of the MIP images confirms the above findings CT Brain Perfusion Findings: CBF (<30%) Volume: None  Perfusion (Tmax>6.0s) volume: None Mismatch Volume: None Infarction Location: None These results were communicated to Dr. Cheral Marker At 3:33 pmon 1/12/2021by text page via the [facility] messaging system. IMPRESSION: CTA neck: 1. The bilateral common and internal carotid arteries are patent within the neck without significant stenosis. Mild plaque at the carotid bifurcations and within the proximal ICAs. 2. Significantly dominant left vertebral artery with redemonstrated moderate/severe ostial stenosis. The vertebral arteries are otherwise patent within the neck without significant stenosis. 3. Ill-defined airspace opacity within the posterior aspect of the partially imaged right upper lobe suspicious for pneumonia. CTA head: 1. No intracranial large vessel occlusion or proximal high-grade arterial stenosis identified. 2. Redemonstrated multifocal high-grade stenoses within distal left anterior cerebral artery branches. 3. Calcified atherosclerosis within the intracranial ICAs, but no more than mild stenosis. CT perfusion head: 1. The perfusion software detects no core infarct. 2. The perfusion software detects no region of critically hypoperfused parenchyma utilizing a Tmax>6 seconds threshold. Electronically Signed   By: Kellie Simmering DO   On: 11/25/2019 15:31   Overnight EEG with video  Result Date: 11/27/2019 Lora Havens, MD     11/27/2019 11:23 AM Patient Name: Corey Oneill MRN: [id number] Epilepsy Attending: Lora Havens Referring Physician/Provider: Dr Kerney Elbe Duration: 11/26/2019 0939 to 11/27/2019 1043  Patient history: 84 y.o.malewith h/o "staring spells" presenting after acute onset of aphasia, right facial droop and right sided weakness at home. EEG to evaluate for seizure.  Level of alertness: awake, asleep  AEDs during EEG study: None  Technical aspects: This EEG study was done with scalp electrodes positioned according to the 10-20 International system of electrode placement. Electrical  activity was acquired at a sampling rate of 500Hz  and reviewed with a high frequency filter of 70Hz  and a low frequency filter of 1Hz . EEG data were recorded continuously and digitally stored.  DESCRIPTION: No clear posterior dominant rhythm was seen. Sleep was characterized by vertex waves, sleep spindles (12-14Hz ), maximal frontocentral. EEG showed continuous generalized 3-5hz  theta-delta slowing as well as intermittent rhythmic generalized 2-3Hz  delta slowing.  Hyperventilation and photic stimulation were not performed.  ABNORMALITY - Continuous slow, generalized - Intermittent rhythmic slow, generalized  IMPRESSION: This study is suggestive of moderate diffuse encephalopathy, non specific to etiology.  No seizures or epileptiform discharges were seen throughout the recording.  Lora Havens   CT HEAD CODE STROKE WO CONTRAST  Result Date: 11/25/2019 CLINICAL DATA:  Code stroke.  Aphasia.  Right-sided weakness. EXAM: CT HEAD WITHOUT CONTRAST TECHNIQUE: Contiguous axial images were obtained from the base of the skull through the vertex without intravenous contrast. COMPARISON:  MRI 11/14/2019 FINDINGS: Brain: Generalized atrophy. Chronic small-vessel ischemic changes throughout the brain as seen previously. No CT evidence of acute infarction, mass lesion, hemorrhage, hydrocephalus or extra-axial collection. Vascular: There is atherosclerotic calcification of the major vessels at the base of the brain. Skull: Normal Sinuses/Orbits: Clear/normal Other: None ASPECTS ([place] Stroke Program Early CT Score) - Ganglionic level infarction (caudate, lentiform nuclei, internal capsule, insula, M1-M3 cortex): 7 - Supraganglionic infarction (M4-M6 cortex): 3 Total score (0-10 with 10 being normal): 10 IMPRESSION: 1. No acute finding by CT. Atrophy and chronic small-vessel ischemic changes  as seen previously. 2. ASPECTS is 10 3. These results were communicated to Dr. Cheral Marker at 2:29 pmon 1/12/2021by text page via  the [facility] messaging system. Electronically Signed   By: Nelson Chimes M.D.   On: 11/25/2019 14:30     Assessment/Plan: Diagnosis: Partial complex seizures and moderately diffuse encephalopathy 1. Does the need for close, 24 hr/day medical supervision in concert with the patient's rehab needs make it unreasonable for this patient to be served in a less intensive setting? Potentially 2. Co-Morbidities requiring supervision/potential complications: metastatic prostate Cancer, chronic ITP, bacteremia on Amoxicillin life long; bullous pemphigoid 3. Due to safety, skin/wound care, disease management, medication administration and patient education, does the patient require 24 hr/day rehab nursing? Potentially 4. Does the patient require coordinated care of a physician, rehab nurse, therapy disciplines of PT/OT/SLP? to address physical and functional deficits in the context of the above medical diagnosis(es)? Potentially Addressing deficits in the following areas: balance, endurance, locomotion, strength, transferring, bathing, dressing, grooming, toileting and cognition 5. Can the patient actively participate in an intensive therapy program of at least 3 hrs of therapy per day at least 5 days per week? Potentially 6. The potential for patient to make measurable gains while on inpatient rehab is are potentially good, however pt was emphatic he wanted to go straight home 7. Anticipated functional outcomes upon discharge from inpatient rehab are n/a  with PT, n/a with OT, n/a with SLP. 8. Estimated rehab length of stay to reach the above functional goals is: pt wants to go home currently 9. Anticipated discharge destination: Home 10. Overall Rehab/Functional Prognosis: pt could benefit fro inpt rehab, however wants to go home.   RECOMMENDATIONS: This patient's condition is appropriate for continued rehabilitative care in the following setting: [facility] Therapy Patient has agreed to participate in recommended  program. No Note that insurance prior authorization may be required for reimbursement for recommended care.  Comment:  1. Discussed with pt in depth his desire to go home- explained that the therapist's felt that pt was not at baseline and needed more assistance; he voiced, clearly, that he felt he was at baseline- he noted that he would be willing to do H/H- them coming to house, and also would be willing to possibly get a hospital bed to make it easier to get in/out of bed at home, however he didn't want to stay in the hospital to go to CIR.  2. In this situation, I recommend, H/H 3. As above, suggest a hospital bed due to pt's predicted progressive decline to make bed mobility and getting in and out of bed easier.  4. If pt/wife decide differently, please fell free to call us back.   Bary Leriche, PA-C 11/27/2019

## 2019-11-27 NOTE — Progress Notes (Addendum)
Subjective: No compaints  Objective: Current vital signs: BP (!) 109/40 (BP Location: Right Arm)   Pulse 63   Temp (!) 97.4 F (36.3 C) (Oral)   Resp 16   SpO2 99%  Vital signs in last 24 hours: Temp:  [97.4 F (36.3 C)-98.2 F (36.8 C)] 97.4 F (36.3 C) (01/14 0844) Pulse Rate:  [62-73] 63 (01/14 0844) Resp:  [15-18] 16 (01/14 0844) BP: (108-154)/(40-78) 109/40 (01/14 0844) SpO2:  [97 %-100 %] 99 % (01/14 0844)  Intake/Output from previous day: 01/13 0701 - 01/14 0700 In: 270 [P.O.:120] Out: 550 [Urine:550] Intake/Output this shift: No intake/output data recorded. Nutritional status:  Diet Order            Diet Heart Fluid consistency: Thin  Diet effective ____              General Exam: HEENT: Normocephalic atraumatic Lungs: Clear to auscultation Ext: Warm dry and intact  Neurologic Exam: Ment: Alert and oriented with no dysarthria or aphasia.  Able to follow commands CN: PERRL, EOMI, face symmetrical, tongue midline, sensation intact bilaterally Motor: Moving upper extremities 5/5, moving lower extremities 4/5 Sensory: Intact throughout  Lab Results: Results for orders placed or performed during the hospital encounter of 11/25/19 (from the past 48 hour(s))  Protime-INR     Status: None   Collection Time: 11/25/19  2:13 PM  Result Value Ref Range   Prothrombin Time 14.6 11.4 - 15.2 seconds   INR 1.2 0.8 - 1.2    Comment: (NOTE) INR goal varies based on device and disease states. Performed at [facility], [place] [address] [place], [place] [address]   APTT     Status: None   Collection Time: 11/25/19  2:13 PM  Result Value Ref Range   aPTT 26 24 - 36 seconds    Comment: Performed at [facility] [address] [place], [place] [address]  CBC     Status: Abnormal   Collection Time: 11/25/19  2:13 PM  Result Value Ref Range   WBC 4.4 4.0 - 10.5 K/uL   RBC 3.26 (L) 4.22 - 5.81 MIL/uL   Hemoglobin 10.8 (L) 13.0 - 17.0 g/dL   HCT 32.3  (L) 39.0 - 52.0 %   MCV 99.1 80.0 - 100.0 fL   MCH 33.1 26.0 - 34.0 pg   MCHC 33.4 30.0 - 36.0 g/dL   RDW 14.6 11.5 - 15.5 %   Platelets 60 (L) 150 - 400 K/uL    Comment: REPEATED TO VERIFY PLATELET COUNT CONFIRMED BY SMEAR Immature Platelet Fraction may be clinically indicated, consider ordering this additional test [id number]    nRBC 0.0 0.0 - 0.2 %    Comment: Performed at [facility], [place] [address] [place], [place] [address]  Differential     Status: None   Collection Time: 11/25/19  2:13 PM  Result Value Ref Range   Neutrophils Relative % 59 %   Neutro Abs 2.6 1.7 - 7.7 K/uL   Lymphocytes Relative 24 %   Lymphs Abs 1.1 0.7 - 4.0 K/uL   Monocytes Relative 14 %   Monocytes Absolute 0.6 0.1 - 1.0 K/uL   Eosinophils Relative 1 %   Eosinophils Absolute 0.1 0.0 - 0.5 K/uL   Basophils Relative 1 %   Basophils Absolute 0.0 0.0 - 0.1 K/uL   Smear Review MORPHOLOGY UNREMARKABLE    Immature Granulocytes 1 %   Abs Immature Granulocytes 0.02 0.00 - 0.07 K/uL  Comment: Performed at [facility], [place] [address], [place], [place] [address]  Comprehensive metabolic panel     Status: Abnormal   Collection Time: 11/25/19  2:13 PM  Result Value Ref Range   Sodium 134 (L) 135 - 145 mmol/L   Potassium 3.9 3.5 - 5.1 mmol/L   Chloride 100 98 - 111 mmol/L   CO2 24 22 - 32 mmol/L   Glucose, Bld 100 (H) 70 - 99 mg/dL   BUN 18 8 - 23 mg/dL   Creatinine, Ser 0.84 0.61 - 1.24 mg/dL   Calcium 8.7 (L) 8.9 - 10.3 mg/dL   Total Protein 6.4 (L) 6.5 - 8.1 g/dL   Albumin 3.1 (L) 3.5 - 5.0 g/dL   AST 21 15 - 41 U/L   ALT 9 0 - 44 U/L   Alkaline Phosphatase 71 38 - 126 U/L   Total Bilirubin 0.3 0.3 - 1.2 mg/dL   GFR calc non Af Amer >60 >60 mL/min   GFR calc Af Amer >60 >60 mL/min   Anion gap 10 5 - 15    Comment: Performed at [facility], [place] [address] [place], [place] [address]  CBG monitoring, ED     Status: None   Collection Time: 11/25/19  2:13 PM  Result Value Ref  Range   Glucose-Capillary 94 70 - 99 mg/dL  I-stat chem 8, ED     Status: Abnormal   Collection Time: 11/25/19  2:17 PM  Result Value Ref Range   Sodium 134 (L) 135 - 145 mmol/L   Potassium 4.0 3.5 - 5.1 mmol/L   Chloride 100 98 - 111 mmol/L   BUN 20 8 - 23 mg/dL   Creatinine, Ser 0.70 0.61 - 1.24 mg/dL   Glucose, Bld 99 70 - 99 mg/dL   Calcium, Ion 0.94 (L) 1.15 - 1.40 mmol/L   TCO2 27 22 - 32 mmol/L   Hemoglobin 10.5 (L) 13.0 - 17.0 g/dL   HCT 31.0 (L) 39.0 - 52.0 %  Respiratory Panel by RT PCR (Flu A&B, Covid) - Nasopharyngeal Swab     Status: None   Collection Time: 11/25/19  4:31 PM   Specimen: Nasopharyngeal Swab  Result Value Ref Range   SARS Coronavirus 2 by RT PCR NEGATIVE NEGATIVE    Comment: (NOTE) SARS-CoV-2 target nucleic acids are NOT DETECTED. The SARS-CoV-2 RNA is generally detectable in upper respiratoy specimens during the acute phase of infection. The lowest concentration of SARS-CoV-2 viral copies this assay can detect is 131 copies/mL. A negative result does not preclude SARS-Cov-2 infection and should not be used as the sole basis for treatment or other patient management decisions. A negative result may occur with  improper specimen collection/handling, submission of specimen other than nasopharyngeal swab, presence of viral mutation(s) within the areas targeted by this assay, and inadequate number of viral copies (<131 copies/mL). A negative result must be combined with clinical observations, patient history, and epidemiological information. The expected result is Negative. Fact Sheet for Patients:  PinkCheek.be Fact Sheet for Healthcare Providers:  GravelBags.it This test is not yet ap proved or cleared by the [place] FDA and  has been authorized for detection and/or diagnosis of SARS-CoV-2 by FDA under an Emergency Use Authorization (EUA). This EUA will remain  in effect (meaning this  test can be used) for the duration of the COVID-19 declaration under Section 564(b)(1) of the Act, 21 U.S.C. section 360bbb-3(b)(1), unless the authorization is terminated or revoked sooner.  Influenza A by PCR NEGATIVE NEGATIVE   Influenza B by PCR NEGATIVE NEGATIVE    Comment: (NOTE) The Xpert Xpress SARS-CoV-2/FLU/RSV assay is intended as an aid in  the diagnosis of influenza from Nasopharyngeal swab specimens and  should not be used as a sole basis for treatment. Nasal washings and  aspirates are unacceptable for Xpert Xpress SARS-CoV-2/FLU/RSV  testing. Fact Sheet for Patients: PinkCheek.be Fact Sheet for Healthcare Providers: GravelBags.it This test is not yet approved or cleared by the [place] FDA and  has been authorized for detection and/or diagnosis of SARS-CoV-2 by  FDA under an Emergency Use Authorization (EUA). This EUA will remain  in effect (meaning this test can be used) for the duration of the  Covid-19 declaration under Section 564(b)(1) of the Act, 21  U.S.C. section 360bbb-3(b)(1), unless the authorization is  terminated or revoked. Performed at [facility], [place] [address], [place], [place] [address]   Culture, blood (routine x 2)     Status: None (Preliminary result)   Collection Time: 11/26/19 12:50 AM   Specimen: BLOOD  Result Value Ref Range   Specimen Description BLOOD RIGHT ANTECUBITAL    Special Requests      BOTTLES DRAWN AEROBIC AND ANAEROBIC Blood Culture adequate volume   Culture      NO GROWTH 1 DAY Performed at [facility], [place] [address], [place], [place] [address]    Report Status PENDING   Culture, blood (routine x 2)     Status: None (Preliminary result)   Collection Time: 11/26/19 12:59 AM   Specimen: BLOOD  Result Value Ref Range   Specimen Description BLOOD LEFT ANTECUBITAL    Special Requests      BOTTLES DRAWN AEROBIC AND ANAEROBIC Blood Culture  adequate volume   Culture      NO GROWTH 1 DAY Performed at [facility], [place] [address], [place], [place] [address]    Report Status PENDING   CBC     Status: Abnormal   Collection Time: 11/27/19  3:33 AM  Result Value Ref Range   WBC 3.7 (L) 4.0 - 10.5 K/uL   RBC 3.35 (L) 4.22 - 5.81 MIL/uL   Hemoglobin 10.9 (L) 13.0 - 17.0 g/dL   HCT 31.9 (L) 39.0 - 52.0 %   MCV 95.2 80.0 - 100.0 fL   MCH 32.5 26.0 - 34.0 pg   MCHC 34.2 30.0 - 36.0 g/dL   RDW 14.3 11.5 - 15.5 %   Platelets 59 (L) 150 - 400 K/uL    Comment: REPEATED TO VERIFY Immature Platelet Fraction may be clinically indicated, consider ordering this additional test [id number] CONSISTENT WITH PREVIOUS RESULT    nRBC 0.0 0.0 - 0.2 %    Comment: Performed at [facility], [place] [address] [place], [place] [address]    Recent Results (from the past 240 hour(s))  Respiratory Panel by RT PCR (Flu A&B, Covid) - Nasopharyngeal Swab     Status: None   Collection Time: 11/25/19  4:31 PM   Specimen: Nasopharyngeal Swab  Result Value Ref Range Status   SARS Coronavirus 2 by RT PCR NEGATIVE NEGATIVE Final    Comment: (NOTE) SARS-CoV-2 target nucleic acids are NOT DETECTED. The SARS-CoV-2 RNA is generally detectable in upper respiratoy specimens during the acute phase of infection. The lowest concentration of SARS-CoV-2 viral copies this assay can detect is 131 copies/mL. A negative result does not preclude SARS-Cov-2 infection and should not be used  as the sole basis for treatment or other patient management decisions. A negative result may occur with  improper specimen collection/handling, submission of specimen other than nasopharyngeal swab, presence of viral mutation(s) within the areas targeted by this assay, and inadequate number of viral copies (<131 copies/mL). A negative result must be combined with clinical observations, patient history, and epidemiological information. The expected result is  Negative. Fact Sheet for Patients:  PinkCheek.be Fact Sheet for Healthcare Providers:  GravelBags.it This test is not yet ap proved or cleared by the [place] FDA and  has been authorized for detection and/or diagnosis of SARS-CoV-2 by FDA under an Emergency Use Authorization (EUA). This EUA will remain  in effect (meaning this test can be used) for the duration of the COVID-19 declaration under Section 564(b)(1) of the Act, 21 U.S.C. section 360bbb-3(b)(1), unless the authorization is terminated or revoked sooner.    Influenza A by PCR NEGATIVE NEGATIVE Final   Influenza B by PCR NEGATIVE NEGATIVE Final    Comment: (NOTE) The Xpert Xpress SARS-CoV-2/FLU/RSV assay is intended as an aid in  the diagnosis of influenza from Nasopharyngeal swab specimens and  should not be used as a sole basis for treatment. Nasal washings and  aspirates are unacceptable for Xpert Xpress SARS-CoV-2/FLU/RSV  testing. Fact Sheet for Patients: PinkCheek.be Fact Sheet for Healthcare Providers: GravelBags.it This test is not yet approved or cleared by the [place] FDA and  has been authorized for detection and/or diagnosis of SARS-CoV-2 by  FDA under an Emergency Use Authorization (EUA). This EUA will remain  in effect (meaning this test can be used) for the duration of the  Covid-19 declaration under Section 564(b)(1) of the Act, 21  U.S.C. section 360bbb-3(b)(1), unless the authorization is  terminated or revoked. Performed at [facility], [place] [address], [place], [place] [address]   Culture, blood (routine x 2)     Status: None (Preliminary result)   Collection Time: 11/26/19 12:50 AM   Specimen: BLOOD  Result Value Ref Range Status   Specimen Description BLOOD RIGHT ANTECUBITAL  Final   Special Requests   Final    BOTTLES DRAWN AEROBIC AND ANAEROBIC Blood Culture  adequate volume   Culture   Final    NO GROWTH 1 DAY Performed at [facility], [place] [address] [place] [address], [place], [place] [address]    Report Status PENDING  Incomplete  Culture, blood (routine x 2)     Status: None (Preliminary result)   Collection Time: 11/26/19 12:59 AM   Specimen: BLOOD  Result Value Ref Range Status   Specimen Description BLOOD LEFT ANTECUBITAL  Final   Special Requests   Final    BOTTLES DRAWN AEROBIC AND ANAEROBIC Blood Culture adequate volume   Culture   Final    NO GROWTH 1 DAY Performed at [facility], [place] [address] [place], [place] [address]    Report Status PENDING  Incomplete    Lipid Panel No results for input(s): CHOL, TRIG, HDL, CHOLHDL, VLDL, LDLCALC in the last 72 hours.  Studies/Results: EEG  Result Date: 11/25/2019 Lora Havens, MD     11/25/2019  5:42 PM Patient Name: Corey Oneill MRN: [id number] Epilepsy Attending: Lora Havens Referring Physician/Provider: Dr Kerney Elbe Date: 11/25/2019 Duration: 23.41 mins Patient history: 84 y.o. male with h/o "staring spells" presenting after acute onset of aphasia, right facial droop and right sided weakness at home. EEG to evaluate for seizure. Level of alertness: awake AEDs  during EEG study: None Technical aspects: This EEG study was done with scalp electrodes positioned according to the 10-20 International system of electrode placement. Electrical activity was acquired at a sampling rate of 500Hz  and reviewed with a high frequency filter of 70Hz  and a low frequency filter of 1Hz . EEG data were recorded continuously and digitally stored. DESCRIPTION: No clear posterior dominant rhythm was seen. EEG showed continuous generalized 3-5hz  theta-delta slowing as well as intermittent rhythmic generalized 2-3Hz  delta slowing.  Hyperventilation and photic stimulation were not performed. ABNORMALITY - Continuous slow, generalized - Intermittent rhythmic slow, generalized IMPRESSION: This study is suggestive  of moderate diffuse encephalopathy, non specific to etiology.  No seizures or epileptiform discharges were seen throughout the recording. Lora Havens   CT Code Stroke CTA Head W/WO contrast  Result Date: 11/25/2019 CLINICAL DATA:  Neuro deficit, acute, stroke suspected. EXAM: CT ANGIOGRAPHY HEAD AND NECK CT PERFUSION BRAIN TECHNIQUE: Multidetector CT imaging of the head and neck was performed using the standard protocol during bolus administration of intravenous contrast. Multiplanar CT image reconstructions and MIPs were obtained to evaluate the vascular anatomy. Carotid stenosis measurements (when applicable) are obtained utilizing NASCET criteria, using the distal internal carotid diameter as the denominator. Multiphase CT imaging of the brain was performed following IV bolus contrast injection. Subsequent parametric perfusion maps were calculated using RAPID software. CONTRAST:  116mL OMNIPAQUE IOHEXOL 350 MG/ML SOLN COMPARISON:  Noncontrast head CT performed earlier the same day, CT angiogram head/neck 11/14/2019 FINDINGS: CTA NECK FINDINGS Aortic arch: Standard aortic branching. Scattered soft and calcified plaque within the visualized aortic arch and proximal major branch vessels of the neck. Right carotid system: CCA widely patent to the bifurcation. Mild atherosclerotic plaque within the carotid bifurcation and proximal ICA. No measurable stenosis of the proximal ICA relative to the more distal vessel. Left carotid system: CCA patent to the bifurcation. Soft and calcified plaque within the carotid bifurcation and proximal ICA. No measurable stenosis of the proximal ICA as compared to the more distal vessel. Vertebral arteries: The left vertebral artery is significantly dominant. The non dominant right vertebral artery is patent throughout the neck without significant stenosis (50% or greater). Mixed plaque results in unchanged moderate/severe ostial stenosis at the origin of the dominant left  vertebral artery. Distal to this, the left vertebral artery is patent within the neck without significant stenosis (50% or greater). Skeleton: No acute bony abnormality. Cervical spondylosis with multilevel shallow posterior disc osteophytes, uncovertebral and facet hypertrophy. No high-grade bony spinal canal stenosis. Other neck: No neck mass or cervical lymphadenopathy. Subcentimeter right thyroid lobe nodule, not meeting consensus criteria for ultrasound follow-up. Upper chest: Ill-defined opacity within the posterior aspect of the partially visualized right upper lobe suspicious for pneumonia. Review of the MIP images confirms the above findings CTA HEAD FINDINGS Anterior circulation: The intracranial internal carotid arteries are patent bilaterally with scattered calcified plaque but no more than mild stenosis. The right middle and anterior cerebral arteries are patent without proximal branch occlusion or high-grade proximal stenosis. The left middle cerebral artery is patent without proximal branch occlusion or high-grade proximal arterial stenosis identified. The left anterior cerebral artery is patent without significant proximal stenosis. Redemonstrated multifocal high-grade stenoses within distal left anterior cerebral artery branches (for instance as seen on series 6, image 58) (series 11, image 23). No intracranial aneurysm is identified. Posterior circulation: The non dominant intracranial right vertebral artery is patent and appears to terminate as the right PICA. The dominant intracranial left vertebral  artery is patent without significant stenosis, as is the basilar artery. The bilateral posterior cerebral arteries are patent without significant proximal stenosis. Posterior communicating arteries are poorly delineated and may be hypoplastic or absent bilaterally. Venous sinuses: Within limitations of contrast timing, no convincing thrombus. Anatomic variants: As described Review of the MIP images  confirms the above findings CT Brain Perfusion Findings: CBF (<30%) Volume: None Perfusion (Tmax>6.0s) volume: None Mismatch Volume: None Infarction Location: None These results were communicated to Dr. Cheral Marker At 3:33 pmon 1/12/2021by text page via the [facility] messaging system. IMPRESSION: CTA neck: 1. The bilateral common and internal carotid arteries are patent within the neck without significant stenosis. Mild plaque at the carotid bifurcations and within the proximal ICAs. 2. Significantly dominant left vertebral artery with redemonstrated moderate/severe ostial stenosis. The vertebral arteries are otherwise patent within the neck without significant stenosis. 3. Ill-defined airspace opacity within the posterior aspect of the partially imaged right upper lobe suspicious for pneumonia. CTA head: 1. No intracranial large vessel occlusion or proximal high-grade arterial stenosis identified. 2. Redemonstrated multifocal high-grade stenoses within distal left anterior cerebral artery branches. 3. Calcified atherosclerosis within the intracranial ICAs, but no more than mild stenosis. CT perfusion head: 1. The perfusion software detects no core infarct. 2. The perfusion software detects no region of critically hypoperfused parenchyma utilizing a Tmax>6 seconds threshold. Electronically Signed   By: Kellie Simmering DO   On: 11/25/2019 15:31   CT Code Stroke CTA Neck W/WO contrast  Result Date: 11/25/2019 CLINICAL DATA:  Neuro deficit, acute, stroke suspected. EXAM: CT ANGIOGRAPHY HEAD AND NECK CT PERFUSION BRAIN TECHNIQUE: Multidetector CT imaging of the head and neck was performed using the standard protocol during bolus administration of intravenous contrast. Multiplanar CT image reconstructions and MIPs were obtained to evaluate the vascular anatomy. Carotid stenosis measurements (when applicable) are obtained utilizing NASCET criteria, using the distal internal carotid diameter as the denominator. Multiphase CT  imaging of the brain was performed following IV bolus contrast injection. Subsequent parametric perfusion maps were calculated using RAPID software. CONTRAST:  153mL OMNIPAQUE IOHEXOL 350 MG/ML SOLN COMPARISON:  Noncontrast head CT performed earlier the same day, CT angiogram head/neck 11/14/2019 FINDINGS: CTA NECK FINDINGS Aortic arch: Standard aortic branching. Scattered soft and calcified plaque within the visualized aortic arch and proximal major branch vessels of the neck. Right carotid system: CCA widely patent to the bifurcation. Mild atherosclerotic plaque within the carotid bifurcation and proximal ICA. No measurable stenosis of the proximal ICA relative to the more distal vessel. Left carotid system: CCA patent to the bifurcation. Soft and calcified plaque within the carotid bifurcation and proximal ICA. No measurable stenosis of the proximal ICA as compared to the more distal vessel. Vertebral arteries: The left vertebral artery is significantly dominant. The non dominant right vertebral artery is patent throughout the neck without significant stenosis (50% or greater). Mixed plaque results in unchanged moderate/severe ostial stenosis at the origin of the dominant left vertebral artery. Distal to this, the left vertebral artery is patent within the neck without significant stenosis (50% or greater). Skeleton: No acute bony abnormality. Cervical spondylosis with multilevel shallow posterior disc osteophytes, uncovertebral and facet hypertrophy. No high-grade bony spinal canal stenosis. Other neck: No neck mass or cervical lymphadenopathy. Subcentimeter right thyroid lobe nodule, not meeting consensus criteria for ultrasound follow-up. Upper chest: Ill-defined opacity within the posterior aspect of the partially visualized right upper lobe suspicious for pneumonia. Review of the MIP images confirms the above findings CTA HEAD FINDINGS  Anterior circulation: The intracranial internal carotid arteries are  patent bilaterally with scattered calcified plaque but no more than mild stenosis. The right middle and anterior cerebral arteries are patent without proximal branch occlusion or high-grade proximal stenosis. The left middle cerebral artery is patent without proximal branch occlusion or high-grade proximal arterial stenosis identified. The left anterior cerebral artery is patent without significant proximal stenosis. Redemonstrated multifocal high-grade stenoses within distal left anterior cerebral artery branches (for instance as seen on series 6, image 58) (series 11, image 23). No intracranial aneurysm is identified. Posterior circulation: The non dominant intracranial right vertebral artery is patent and appears to terminate as the right PICA. The dominant intracranial left vertebral artery is patent without significant stenosis, as is the basilar artery. The bilateral posterior cerebral arteries are patent without significant proximal stenosis. Posterior communicating arteries are poorly delineated and may be hypoplastic or absent bilaterally. Venous sinuses: Within limitations of contrast timing, no convincing thrombus. Anatomic variants: As described Review of the MIP images confirms the above findings CT Brain Perfusion Findings: CBF (<30%) Volume: None Perfusion (Tmax>6.0s) volume: None Mismatch Volume: None Infarction Location: None These results were communicated to Dr. Cheral Marker At 3:33 pmon 1/12/2021by text page via the [facility] messaging system. IMPRESSION: CTA neck: 1. The bilateral common and internal carotid arteries are patent within the neck without significant stenosis. Mild plaque at the carotid bifurcations and within the proximal ICAs. 2. Significantly dominant left vertebral artery with redemonstrated moderate/severe ostial stenosis. The vertebral arteries are otherwise patent within the neck without significant stenosis. 3. Ill-defined airspace opacity within the posterior aspect of the partially  imaged right upper lobe suspicious for pneumonia. CTA head: 1. No intracranial large vessel occlusion or proximal high-grade arterial stenosis identified. 2. Redemonstrated multifocal high-grade stenoses within distal left anterior cerebral artery branches. 3. Calcified atherosclerosis within the intracranial ICAs, but no more than mild stenosis. CT perfusion head: 1. The perfusion software detects no core infarct. 2. The perfusion software detects no region of critically hypoperfused parenchyma utilizing a Tmax>6 seconds threshold. Electronically Signed   By: Kellie Simmering DO   On: 11/25/2019 15:31   CT Code Stroke Cerebral Perfusion with contrast  Result Date: 11/25/2019 CLINICAL DATA:  Neuro deficit, acute, stroke suspected. EXAM: CT ANGIOGRAPHY HEAD AND NECK CT PERFUSION BRAIN TECHNIQUE: Multidetector CT imaging of the head and neck was performed using the standard protocol during bolus administration of intravenous contrast. Multiplanar CT image reconstructions and MIPs were obtained to evaluate the vascular anatomy. Carotid stenosis measurements (when applicable) are obtained utilizing NASCET criteria, using the distal internal carotid diameter as the denominator. Multiphase CT imaging of the brain was performed following IV bolus contrast injection. Subsequent parametric perfusion maps were calculated using RAPID software. CONTRAST:  152mL OMNIPAQUE IOHEXOL 350 MG/ML SOLN COMPARISON:  Noncontrast head CT performed earlier the same day, CT angiogram head/neck 11/14/2019 FINDINGS: CTA NECK FINDINGS Aortic arch: Standard aortic branching. Scattered soft and calcified plaque within the visualized aortic arch and proximal major branch vessels of the neck. Right carotid system: CCA widely patent to the bifurcation. Mild atherosclerotic plaque within the carotid bifurcation and proximal ICA. No measurable stenosis of the proximal ICA relative to the more distal vessel. Left carotid system: CCA patent to the  bifurcation. Soft and calcified plaque within the carotid bifurcation and proximal ICA. No measurable stenosis of the proximal ICA as compared to the more distal vessel. Vertebral arteries: The left vertebral artery is significantly dominant. The non dominant right vertebral artery  is patent throughout the neck without significant stenosis (50% or greater). Mixed plaque results in unchanged moderate/severe ostial stenosis at the origin of the dominant left vertebral artery. Distal to this, the left vertebral artery is patent within the neck without significant stenosis (50% or greater). Skeleton: No acute bony abnormality. Cervical spondylosis with multilevel shallow posterior disc osteophytes, uncovertebral and facet hypertrophy. No high-grade bony spinal canal stenosis. Other neck: No neck mass or cervical lymphadenopathy. Subcentimeter right thyroid lobe nodule, not meeting consensus criteria for ultrasound follow-up. Upper chest: Ill-defined opacity within the posterior aspect of the partially visualized right upper lobe suspicious for pneumonia. Review of the MIP images confirms the above findings CTA HEAD FINDINGS Anterior circulation: The intracranial internal carotid arteries are patent bilaterally with scattered calcified plaque but no more than mild stenosis. The right middle and anterior cerebral arteries are patent without proximal branch occlusion or high-grade proximal stenosis. The left middle cerebral artery is patent without proximal branch occlusion or high-grade proximal arterial stenosis identified. The left anterior cerebral artery is patent without significant proximal stenosis. Redemonstrated multifocal high-grade stenoses within distal left anterior cerebral artery branches (for instance as seen on series 6, image 58) (series 11, image 23). No intracranial aneurysm is identified. Posterior circulation: The non dominant intracranial right vertebral artery is patent and appears to terminate as  the right PICA. The dominant intracranial left vertebral artery is patent without significant stenosis, as is the basilar artery. The bilateral posterior cerebral arteries are patent without significant proximal stenosis. Posterior communicating arteries are poorly delineated and may be hypoplastic or absent bilaterally. Venous sinuses: Within limitations of contrast timing, no convincing thrombus. Anatomic variants: As described Review of the MIP images confirms the above findings CT Brain Perfusion Findings: CBF (<30%) Volume: None Perfusion (Tmax>6.0s) volume: None Mismatch Volume: None Infarction Location: None These results were communicated to Dr. Cheral Marker At 3:33 pmon 1/12/2021by text page via the [facility] messaging system. IMPRESSION: CTA neck: 1. The bilateral common and internal carotid arteries are patent within the neck without significant stenosis. Mild plaque at the carotid bifurcations and within the proximal ICAs. 2. Significantly dominant left vertebral artery with redemonstrated moderate/severe ostial stenosis. The vertebral arteries are otherwise patent within the neck without significant stenosis. 3. Ill-defined airspace opacity within the posterior aspect of the partially imaged right upper lobe suspicious for pneumonia. CTA head: 1. No intracranial large vessel occlusion or proximal high-grade arterial stenosis identified. 2. Redemonstrated multifocal high-grade stenoses within distal left anterior cerebral artery branches. 3. Calcified atherosclerosis within the intracranial ICAs, but no more than mild stenosis. CT perfusion head: 1. The perfusion software detects no core infarct. 2. The perfusion software detects no region of critically hypoperfused parenchyma utilizing a Tmax>6 seconds threshold. Electronically Signed   By: Kellie Simmering DO   On: 11/25/2019 15:31   CT HEAD CODE STROKE WO CONTRAST  Result Date: 11/25/2019 CLINICAL DATA:  Code stroke.  Aphasia.  Right-sided weakness. EXAM: CT  HEAD WITHOUT CONTRAST TECHNIQUE: Contiguous axial images were obtained from the base of the skull through the vertex without intravenous contrast. COMPARISON:  MRI 11/14/2019 FINDINGS: Brain: Generalized atrophy. Chronic small-vessel ischemic changes throughout the brain as seen previously. No CT evidence of acute infarction, mass lesion, hemorrhage, hydrocephalus or extra-axial collection. Vascular: There is atherosclerotic calcification of the major vessels at the base of the brain. Skull: Normal Sinuses/Orbits: Clear/normal Other: None ASPECTS ([place] Stroke Program Early CT Score) - Ganglionic level infarction (caudate, lentiform nuclei, internal capsule, insula, M1-M3 cortex):  7 - Supraganglionic infarction (M4-M6 cortex): 3 Total score (0-10 with 10 being normal): 10 IMPRESSION: 1. No acute finding by CT. Atrophy and chronic small-vessel ischemic changes as seen previously. 2. ASPECTS is 10 3. These results were communicated to Dr. Cheral Marker at 2:29 pmon 1/12/2021by text page via the [facility] messaging system. Electronically Signed   By: Nelson Chimes M.D.   On: 11/25/2019 14:30    Medications:  Scheduled: . amoxicillin  500 mg Oral BID  . aspirin EC  81 mg Oral QPM  . carvedilol  3.125 mg Oral BID  . doxycycline  100 mg Oral BID  . DULoxetine  30 mg Oral Daily  . enzalutamide  160 mg Oral Q supper  . folic acid  1 mg Oral Once per day on Sun Mon Wed Thu Fri Sat  . isosorbide mononitrate  30 mg Oral Daily  . rosuvastatin  10 mg Oral Daily  . sodium chloride flush  3 mL Intravenous Q12H   Continuous: . sodium chloride      Assessment: 84 year old male presenting after acute onset of aphasia, right facial droop and right-sided weakness at home. Stroke work-up with CT head, CTA head and neck along with CT perfusion did not reveal any abnormalities.  The most likely etiology for his presentation was felt to be recurrent partial complex seizures as he has had one stereotyped episode previously with  similar symptoms. However, spot EEG and LTM EEG revealed no electrographic seizures or epileptiform discharges.  1. Spot EEG (1/12): This study is suggestive of moderate diffuse encephalopathy, non specific to etiology.  No seizures or epileptiform discharges were seen throughout the recording.  2. LTM EEG shows no electrographic seizures or epileptiform discharges.  3. Neurological exam today is unremarkable. The patient is back to his baseline.  4. Etiology for his recurrent spells of aphasia and weakness is uncertain, given that EEGs were negative for seizure predisposition and MRI brain on 11/14/19 showing no acute intracranial abnormality or focal lesion.   5. Moderately advanced cerebral atrophy with chronic small vessel ischemic disease with a few small chronic white matter hemorrhages were noted on prior MRI. These findings increase the risk for a development of a possible mixed dementia, which could account for his spells of recurrent AMS (mixed dementia: typically Alzheimer's and small vessel disease pathology). However, the MRI findings do not account for his episodes of transient weakness.   Recommendations: -LTM EEG being discontinued --Will need outpatient Neurology follow up to assess for possible subtle cognitive impairments (evaluate for possible mild cognitive impairment, which can progress to dementia).  --Continue ASA at discharge -Neurology service will sign off. Please call if there are additional questions .    LOS: 1 day   @Electronically  signed: Dr. Kerney Elbe 11/27/2019  10:16 AM

## 2019-11-27 NOTE — Plan of Care (Signed)
  Problem: Education: Goal: Knowledge of disease or condition will improve Outcome: Progressing Goal: Knowledge of secondary prevention will improve Outcome: Progressing Goal: Knowledge of patient specific risk factors addressed and post discharge goals established will improve Outcome: Progressing Goal: Individualized Educational Video(s) Outcome: Progressing   Problem: Coping: Goal: Will verbalize positive feelings about self Outcome: Progressing Goal: Will identify appropriate support needs Outcome: Progressing   Problem: Health Behavior/Discharge Planning: Goal: Ability to manage health-related needs will improve Outcome: Progressing   Problem: Self-Care: Goal: Ability to participate in self-care as condition permits will improve Outcome: Progressing Goal: Verbalization of feelings and concerns over difficulty with self-care will improve Outcome: Progressing Goal: Ability to communicate needs accurately will improve Outcome: Progressing   Problem: Nutrition: Goal: Risk of aspiration will decrease Outcome: Progressing Goal: Dietary intake will improve Outcome: Progressing   Problem: Ischemic Stroke/TIA Tissue Perfusion: Goal: Complications of ischemic stroke/TIA will be minimized Outcome: Progressing   Problem: Education: Goal: Knowledge of General Education information will improve Description: Including pain rating scale, medication(s)/side effects and non-pharmacologic comfort measures Outcome: Progressing   Problem: Health Behavior/Discharge Planning: Goal: Ability to manage health-related needs will improve Outcome: Progressing   Problem: Clinical Measurements: Goal: Ability to maintain clinical measurements within normal limits will improve Outcome: Progressing Goal: Will remain free from infection Outcome: Progressing Goal: Diagnostic test results will improve Outcome: Progressing Goal: Respiratory complications will improve Outcome: Progressing Goal:  Cardiovascular complication will be avoided Outcome: Progressing   Ival Bible, BSN, RN

## 2019-11-28 NOTE — Progress Notes (Signed)
Occupational Therapy Treatment Patient Details Name: Corey Oneill MRN: [id number] DOB: Mar 19, 1936 Today's Date: 11/28/2019    History of present illness Corey Oneill is a 84 y.o. male with medical history significant of hearing loss; metastatic prostate CA with resultant radiation proctitis; chronic ITP; s/p TAVR with recurrent enterococcal bacteremia; grade 1 diastolic dysfunction; HLD; CAD presenting with aphasia and R-sided weakness.CT head, CTA head and neck as well as CT perfusion did not reveal any abnormalities. Evaluated by neuro who thought possible partial complex seizure as he has hx of same. LTM EEG ordered.   OT comments  Pt seen for BADL progression with focus on endurance and safety. Pt able to complete bed mobility at min A and functional transfer and mobility at min guard assist with RW. Pt incontinent of bowels in bed and on floor when completing functional mobility to toilet. Pt was able to recall feeling of BM, educated on timing to prevent incontinence and improvement of independence. Pt completed toilet transfer at min A for low surface, needing total A For peri care. Pt then able to stand at the sink for two grooming tasks. He fatigues easily, knees slowly buckle but able to maintain standing. Pt then able to complete functional mobility in hall about household distance with min cues for trail making back to room. Updated recs to CIR for short intensive therapy to improve BADL independence prior to returning home to improve home safety and caregiver independence for fall and readmission reduction. Will continue to follow acutely.    Follow Up Recommendations  CIR;Supervision/Assistance - 24 hour    Equipment Recommendations  Wheelchair (measurements OT);Wheelchair cushion (measurements OT)    Recommendations for Other Services      Precautions / Restrictions Precautions Precautions: Fall Restrictions Weight Bearing Restrictions: No       Mobility Bed  Mobility Overal bed mobility: Needs Assistance Bed Mobility: Supine to Sit     Supine to sit: Min guard;Min assist     General bed mobility comments: increased time and effort, slight assist for initiation and BLE translation to EOB  Transfers Overall transfer level: Needs assistance Equipment used: Rolling walker (2 wheeled) Transfers: Sit to/from Stand Sit to Stand: Min assist;Min guard         General transfer comment: min A- min guard to rise and steady, cues for safe hand placement and sequencing. Sit <> stand improves with external support of grab bar or elevated surface    Balance Overall balance assessment: Needs assistance Sitting-balance support: Feet unsupported;No upper extremity supported Sitting balance-Leahy Scale: Fair     Standing balance support: Bilateral upper extremity supported;Single extremity supported Standing balance-Leahy Scale: Poor Standing balance comment: reliant on external support when standing at sink to brush teeth                           ADL either performed or assessed with clinical judgement   ADL Overall ADL's : Needs assistance/impaired     Grooming: Min guard;Standing Grooming Details (indicate cue type and reason): standing at sink to brush teeth                 Toilet Transfer: Min guard;Comfort height toilet;Ambulation;RW Toilet Transfer Details (indicate cue type and reason): to toilet in room- pt had urge to have BM but remained incontinent Toileting- Clothing Manipulation and Hygiene: Total assistance;Sit to/from stand Toileting - Clothing Manipulation Details (indicate cue type and reason): continues to require total A, incontinent  in bed and then on floor to bathroom. Total A for wiping and full cleaning     Functional mobility during ADLs: Min guard;Cueing for safety;Cueing for sequencing;Rolling walker General ADL Comments: focused on BADL progression and endurance     Vision Baseline  Vision/History: Wears glasses Wears Glasses: At all times Patient Visual Report: No change from baseline     Perception     Praxis      Cognition Arousal/Alertness: Awake/alert Behavior During Therapy: WFL for tasks assessed/performed Overall Cognitive Status: Impaired/Different from baseline Area of Impairment: Memory;Safety/judgement;Problem solving                     Memory: Decreased short-term memory;Decreased recall of precautions   Safety/Judgement: Decreased awareness of safety;Decreased awareness of deficits   Problem Solving: Difficulty sequencing;Requires verbal cues General Comments: continues to require cues for problem solving with multistep tasks, safety, and sequencing.        Exercises     Shoulder Instructions       General Comments      Pertinent Vitals/ Pain       Pain Assessment: Faces Faces Pain Scale: No hurt  Home Living                                          Prior Functioning/Environment              Frequency  Min 2X/week        Progress Toward Goals  OT Goals(current goals can now be found in the care plan section)  Progress towards OT goals: Progressing toward goals  Acute Rehab OT Goals Patient Stated Goal: talk to my wife about plan OT Goal Formulation: With patient/family Time For Goal Achievement: 12/10/19 Potential to Achieve Goals: Good  Plan Discharge plan needs to be updated;Frequency remains appropriate    Co-evaluation    PT/OT/SLP Co-Evaluation/Treatment: Yes Reason for Co-Treatment: For patient/therapist safety PT goals addressed during session: Mobility/safety with mobility;Balance;Proper use of DME OT goals addressed during session: ADL's and self-care;Proper use of Adaptive equipment and DME;Strengthening/ROM      AM-PAC OT "6 Clicks" Daily Activity     Outcome Measure   Help from another person eating meals?: None Help from another person taking care of personal  grooming?: A Little Help from another person toileting, which includes using toliet, bedpan, or urinal?: A Lot Help from another person bathing (including washing, rinsing, drying)?: A Lot Help from another person to put on and taking off regular upper body clothing?: A Little Help from another person to put on and taking off regular lower body clothing?: A Lot 6 Click Score: 16    End of Session Equipment Utilized During Treatment: Gait belt;Rolling walker  OT Visit Diagnosis: Unsteadiness on feet (R26.81);Other abnormalities of gait and mobility (R26.89);Muscle weakness (generalized) (M62.81);Other symptoms and signs involving cognitive function   Activity Tolerance Patient tolerated treatment well   Patient Left in chair;with call bell/phone within reach;with chair alarm set   Nurse Communication Mobility status        Time: [id number] OT Time Calculation (min): 38 min  Charges: OT General Charges $OT Visit: 1 Visit OT Treatments $Self Care/Home Management : 23-37 mins  Zenovia Jarred, MSOT, OTR/L Acute Rehabilitation Services [facility] Office Number: [phone]  Zenovia Jarred 11/28/2019, 11:03 AM

## 2019-11-28 NOTE — TOC Transition Note (Signed)
Transition of Care [facility]) - CM/SW Discharge Note   Patient Details  Name: Corey Oneill MRN: [id number] Date of Birth: 1935-12-22  [facility]) CM/SW Contact:  Pollie Friar, RN Phone Number: 11/28/2019, 3:35 PM   Clinical Narrative:    Pt is discharging home with [facility] services resumed through [place]. Cory with [name] is aware of the d/c.  Pt has all needed DME at home. He is refusing the recommended wheelchair from OT.  Wife to provide transportation home.    Final next level of care: Home w Home Health Services Barriers to Discharge: No Barriers Identified   Patient Goals and CMS Choice   CMS Medicare.gov Compare Post Acute Care list provided to:: Patient Represenative (must comment) Choice offered to / list presented to : Patient, Adult Children  Discharge Placement                       Discharge Plan and Services   Discharge Planning Services: CM Consult Post Acute Care Choice: Home Health                    HH Arranged: PT, OT [place] Agency: [facility]     Representative spoke with at [place]: [place] ([place]) Interventions     Readmission Risk Interventions No flowsheet data found.

## 2019-11-28 NOTE — Evaluation (Signed)
Speech Language Pathology Evaluation Patient Details Name: Corey Oneill MRN: [id number] DOB: 06/09/1936 Today's Date: 11/28/2019 Time: 1141-1200 SLP Time Calculation (min) (ACUTE ONLY): 19 min  Problem List:  Patient Active Problem List   Diagnosis Date Noted  . Seizures ([place]) 11/26/2019  . Seizure ([place]) 11/26/2019  . Essential hypertension 11/25/2019  . Pressure injury of skin 11/15/2019  . TIA (transient ischemic attack) 11/15/2019  . Word finding difficulty 11/14/2019  . Altered mental state 09/25/2019  . Idiopathic thrombocytopenic purpura (ITP) (HCC) 09/25/2019  . Macrocytic anemia 09/25/2019  . Bacteremia due to Enterococcus 09/11/2019  . Bullous pemphigoid 06/12/2019  . Metastasis from malignant neoplasm of prostate ([place]) 02/25/2019  . Radiation proctitis   . Hx of radiation therapy   . Heart murmur   . H/O blood clots   . Family history of adverse reaction to anesthesia   . CAD (coronary artery disease)   . Aortic stenosis   . Anginal pain ([place])   . History of ITP 02/26/2017  . Malignant melanoma of neck ([place]) 10/02/2016  . Malignant melanoma in situ ([place]) 09/04/2016  . 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 S/P appendectomy 11/12/2015  . Esophageal reflux 03/22/2015  . Depression 11/20/2014  . Prostate cancer ([place]) 03/05/2013  . Erectile dysfunction   . Hyperlipidemia 06/07/2007   Past Medical History:  Past Medical History:  Diagnosis Date  . Abdominal aortic atherosclerosis ([place])   . Anginal pain ([place])    last noted 08/17/19  . Bone cancer ([place])    Left hip  . BPPV (benign paroxysmal positional vertigo)   . Bullous pemphigoid   . CAD (coronary artery disease)    a.  LHC 8/16: Mid to distal LAD 30%, OM1 40%, proximal mid RCA 40%, distal RCA 60% >> FFR 0.69  >> PCI: 3 x 15 mm Resolute DES  . Depression   . Esophageal reflux   . Grade I diastolic dysfunction 123456   Noted on ECHO   . H/O blood clots    "get them in my  stool and urine" (11/12/2015)  . Heart murmur   . Hepatic lesion 12/08/2015   Stable 8 mm right hepatic lesion  . History of aortic stenosis    a. peak to peak gradient by LHC 8/16:  37 mmHg (moderately severe)   . History of appendicitis 2016  . History of blood transfusion 12/2018  . History of herpes labialis   . History of ITP    2018, thrombocytopenia  . History of kidney stones   . Hx of radiation therapy 04/14/13-06/09/13   prostate 7800 cGy, 40 sessions, seminal vesicles 5600 cGy 40 sessions  . Hydrocele 2006   Small  . Hyperlipidemia   . Insomnia    resolved  . LVH (left ventricular hypertrophy)    Moderate, noted on ECHO 09/2018  . Malignant melanoma in situ ([place]) 09/04/2016   Right neck and chest  . Metastasis from malignant neoplasm of prostate ([place]) 02/25/2019  . Prostate cancer ([place]) dx'd 2014  . Radiation proctitis   . Renal mass 2017   Bilateral renal masses  . S/P skin biopsy 04/09/2019   subepidermal cell poor vesicle , Linear IGG and C3 the basement membrane  . Suprapubic catheter ([place])   . Wears hearing aid in both ears    Past Surgical History:  Past Surgical History:  Procedure Laterality Date  . CARDIAC CATHETERIZATION N/A 04/21/2015   Procedure: Right/Left Heart Cath and Coronary Angiography;  Surgeon: Legrand Como  Burt Knack, MD;  Location: [facility];  Service: Cardiovascular;  Laterality: N/A;  . CARDIAC CATHETERIZATION N/A 06/18/2015   Procedure: Intravascular Pressure Wire/FFR Study;  Surgeon: [name], MD;  Location: [place] [facility];  Service: Cardiovascular;  Laterality: N/A;  . CARDIAC CATHETERIZATION N/A 06/18/2015   Procedure: Coronary Stent Intervention;  Surgeon: [name], MD;  Location: [place] [facility];  Service: Cardiovascular;  Laterality: N/A;  . CARDIAC CATHETERIZATION N/A 06/18/2015   Procedure: Right/Left Heart Cath and Coronary Angiography;  Surgeon: [name], MD;  Location: [place] [facility] [place] [facility];  Service: Cardiovascular;   Laterality: N/A;  . CARDIAC CATHETERIZATION N/A 06/23/2015   Procedure: Left Heart Cath and Cors/Grafts Angiography;  Surgeon: [name], MD;  Location: [place] [facility];  Service: Cardiovascular;  Laterality: N/A;  . CARDIAC CATHETERIZATION N/A 01/17/2016   Procedure: Right/Left Heart Cath and Coronary Angiography;  Surgeon: [name], MD;  Location: [place] [facility];  Service: Cardiovascular;  Laterality: N/A;  . CATARACT EXTRACTION, BILATERAL    . COLONOSCOPY  06/26/2017  . CYSTOSCOPY WITH BIOPSY N/A 07/30/2018   Procedure: CYSTOSCOPY WITH BIOPSY/FULGURATION, CYSTOLTHOLAPAXY;  Surgeon: [name], MD;  Location: [facility];  Service: Urology;  Laterality: N/A;  . CYSTOSCOPY WITH URETHRAL DILATATION N/A 08/18/2019   Procedure: CYSTOSCOPY WITH URETHRAL DILATATION USING BALLOON OR LASER/ SPURO PUBIC TUBE CHANGE LASER EXCISION OF URETHRAL STRICTURE RETROGRADE URETHROGRAM ANTEGRADE CYSTOGRAM;  Surgeon: [name], MD;  Location: [facility];  Service: Urology;  Laterality: N/A;  . FRACTURE SURGERY    . INGUINAL HERNIA REPAIR     patient does not remember this procedure  . LAPAROSCOPIC APPENDECTOMY N/A 11/16/2015   Procedure: APPENDECTOMY LAPAROSCOPIC;  Surgeon: [name], MD;  Location: [facility];  Service: General;  Laterality: N/A;  . LEFT HEART CATH AND CORONARY ANGIOGRAPHY N/A 12/26/2016   Procedure: Left Heart Cath and Coronary Angiography;  Surgeon: Troy Sine, MD;  Location: [facility] [facility];  Service: Cardiovascular;  Laterality: N/A;  . MELANOMA EXCISION Right 10/24/2016   Procedure: WIDE EXCISION MELANOMA RIGHT NECK AND RIGHT CHEST TIMES 2;  Surgeon: [name], MD;  Location: [facility] [facility];  Service: General;  Laterality: Right;  right medial and lateral lesion of chest and right neck  . NASAL FRACTURE SURGERY     "broken years ago; several ORs to correct it"  . NASAL SEPTUM SURGERY    . PROSTATE BIOPSY  2014   "needle biopsy"  .  TEE WITHOUT CARDIOVERSION N/A 02/15/2016   Procedure: TRANSESOPHAGEAL ECHOCARDIOGRAM (TEE);  Surgeon: [name], MD;  Location: [facility];  Service: Open Heart Surgery;  Laterality: N/A;  . TEE WITHOUT CARDIOVERSION N/A 08/26/2019   Procedure: TRANSESOPHAGEAL ECHOCARDIOGRAM (TEE);  Surgeon: Pixie Casino, MD;  Location: [facility];  Service: Cardiovascular;  Laterality: N/A;  . TONSILLECTOMY    . TRANSCATHETER AORTIC VALVE REPLACEMENT, TRANSFEMORAL  02/15/2016  . TRANSCATHETER AORTIC VALVE REPLACEMENT, TRANSFEMORAL N/A 02/15/2016   Procedure: TRANSCATHETER AORTIC VALVE REPLACEMENT, TRANSFEMORAL;  Surgeon: [name], MD;  Location: [place];  Service: Open Heart Surgery;  Laterality: N/A;  . TRANSURETHRAL RESECTION OF PROSTATE  12/23/2018   Procedure: CYSTOSCOPY WITH  BLADDER BIOPSY WITH FULGERATION/ TRANSURETHRAL RESECTION PROSTATE  AND PROSTATE BIOPSY;  Surgeon: [name], MD;  [facility];  Service: Urology;;   HPI:  84 y.o. male with medical history significant of hearing loss; metastatic prostate CA with resultant radiation proctitis; chronic ITP; s/p TAVR with  recurrent enterococcal bacteremia; grade 1 diastolic dysfunction; HLD; CAD presenting with aphasia and R-sided weakness.CT head, CTA head and neck as well as CT perfusion did not reveal any abnormalities. Evaluated by neuro who thought possible partial complex seizure as he has hx of same. LTM EEG ordered.   Assessment / Plan / Recommendation Clinical Impression  Pt presents with more moderate cognitive deficits upon informal assessment. These are c/b reduced recall (short term and novel information including daily events), decreased executive function skills, reduced problem solving, and slowed thought organization. Pt self reports losing train of thought frequently and intermittent word finding difficulty, which he admits has worsened since acute hospitalization. PLOF pt resides with spouse (whom he states works from  9-4). Pt will benefit from further cognitive linguistic intervention to maximize safety and independence. Agree with neurology recommendations, pt would benefit from outpatient neurology workup for further assessment of cognition. SLP to follow up during acute stay.    SLP Assessment  SLP Recommendation/Assessment: Patient needs continued Speech Lanaguage Pathology Services SLP Visit Diagnosis: Cognitive communication deficit [id number])    Follow Up Recommendations  Inpatient Rehab;24 hour supervision/assistance    Frequency and Duration min 1 x/week  1 week      SLP Evaluation Cognition  Overall Cognitive Status: Impaired/Different from baseline Arousal/Alertness: Awake/alert Orientation Level: Oriented to person;Oriented to place;Oriented to time Attention: Sustained;Alternating Sustained Attention: Impaired Alternating Attention: Impaired Memory: Impaired Memory Impairment: Storage deficit;Decreased recall of new information;Decreased short term memory;Prospective memory Decreased Short Term Memory: Verbal basic Awareness: Impaired Problem Solving: Impaired Executive Function: Sequencing;Organizing;Self Monitoring Sequencing: Impaired Organizing: Impaired Self Monitoring: Impaired Safety/Judgment: Impaired       Comprehension  Auditory Comprehension Overall Auditory Comprehension: Appears within functional limits for tasks assessed    Expression Expression Primary Mode of Expression: Verbal Verbal Expression Overall Verbal Expression: Impaired at baseline Naming: Impairment Written Expression Dominant Hand: Right   Oral / Motor  Motor Speech Overall Motor Speech: Appears within functional limits for tasks assessed   GO                    Corey Oneill Taneya Conkel MA, CCC-SLP Acute Rehabilitation Services  11/28/2019, 12:17 PM

## 2019-11-28 NOTE — Consult Note (Signed)
   [facility] CM Inpatient Consult   11/28/2019  Corey Oneill [birth year] [id number]    Patientscreened for 30% extreme high risk score forreadmission with 4 hospitalizations and an ED visit in the past 6 months; and to check for[facility]) Care Managementneedsunder his Medicare/ [employer] ACO plan.  Patient had been previously outreached by [facility] [employer], research (life sciences) for EMMI follow-up calls and transition of care.  Our community based plan of care has focused on disease management and community resource support.  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 Neurology was consulted and CT head was negative for acute abnormality.  Primary care provider is Dr. Jill Oneill with [facility].  Chart review reveals CIR (Comprehensive Inpatient Rehab) recommended per PT due to his functional decline.  Spoke to transition of care CM regarding patient's disposition/needs, and acknowledges PT's current recommendation, awaiting CIR staff to evaluate patient. Patient is also set-up with [facility] home health PT/OT if CIR is denied. He also has caregivers at home through [employer].  Noted that patient is currently active with [facility] care program.   Called and spoke briefly with patient over the phone. HIPAA informations verified. Patient was forgetful at times but able to redirect. He reports that therapist (PT) is currently working with him. Permission obtained to speak to his wife Corey Oneill) and phone number was provided. Attempted to call wife several times to discuss patient's needs and services  available for him, but unable to reach her, left a message and requested for a return call.  Contacted patient back and made him aware of attempts made to call his wife but unsuccessful. He gave another phone number to call but that number stayed busy all the time. Mentioned to patient about his prior referral to [facility] and explained to him about services that he could benefit from. Patient was interested with [facility] after discharge.  [facility]) contacted and is familiar with patient's previous referral. States that patient's wife was suppose to call [facility] so they can start the admission process for him.   Patient's RN made aware of trying to get hold of patient's wife and assisted in contacting her and patient's daughter. Awaiting for return call.  Was able to receive a call from patient's daughter Corey Oneill) and found out that patient's wife is having phone issues; and the second phone number given by patient was their landline number from 15 years back. Explained to daughter regarding [facility] care management services and [facility] services (prior referral) but states she does "not feel that extra help is needed at this time because he has enough" and she believes that patient has all the services that he needs for now. Daughter is also aware to contact primary care provider should further health needs arise.   For questions and additional information, please call:  Corey Oneill, BSN, RN-BC [facility] Liaison Cell: [phone]

## 2019-11-28 NOTE — Progress Notes (Signed)
Mr Forgacs daughter, [name] called, stating that the hospital had called her. She had called into CAT scan, so I opened his ancillary orders to try to help figure out who had called. When I realized pt. was an inpatient, I advised [name] to call his floor nurse, and provided the telephone number.

## 2019-11-28 NOTE — Progress Notes (Signed)
Inpatient Rehabilitation-Admissions Coordinator   Followed up with pt after PM&R consult. While pt more open to the idea of CIR, he appears to be close to baseline per PT session today. At [place] level for 100 feet, pt no longer requires IP Rehab stay. AC agrees with current recommendations for home with Home Health therapy. With pt permission, I spoke with the patient's wife who understands current recommendation for home health. TOC team notified.   AC will sign off.   Please call if questions.   Raechel Ache, OTR/L  Rehab Admissions Coordinator  [phone] 11/28/2019 1:46 PM

## 2019-11-28 NOTE — Progress Notes (Signed)
Patient  for discharge today.  Wife will transport patient from hospital entrance at [place] .  IV removed and telemetry discontinued.  AVS discussed,  questions answered.  Alert and oriented with forgetfulness,  vital signs stable upon discharge

## 2019-11-28 NOTE — Progress Notes (Signed)
Physical Therapy Treatment Patient Details Name: Corey Oneill MRN: [id number] DOB: 09/13/1936 Today's Date: 11/28/2019    History of Present Illness Corey Oneill is a 84 y.o. male with medical history significant of hearing loss; metastatic prostate CA with resultant radiation proctitis; chronic ITP; s/p TAVR with recurrent enterococcal bacteremia; grade 1 diastolic dysfunction; HLD; CAD presenting with aphasia and R-sided weakness.CT head, CTA head and neck as well as CT perfusion did not reveal any abnormalities. Evaluated by neuro who thought possible partial complex seizure as he has hx of same. LTM EEG ordered.    PT Comments    Pt seems to be closer to functional baseline today in comparison to yesterday; no lateral lean or left knee buckle noted. Ambulating 100 feet with a walker at a min guard assist level; able to participate in hygiene tasks at sink with supervision for safety. Updated d/c plan to reflect progress.     Follow Up Recommendations  Home health PT;Supervision/Assistance - 24 hour     Equipment Recommendations  None recommended by PT    Recommendations for Other Services       Precautions / Restrictions Precautions Precautions: Fall Restrictions Weight Bearing Restrictions: No    Mobility  Bed Mobility Overal bed mobility: Needs Assistance Bed Mobility: Supine to Sit     Supine to sit: Supervision     General bed mobility comments: supervision for safety  Transfers Overall transfer level: Needs assistance Equipment used: Rolling walker (2 wheeled) Transfers: Sit to/from Stand Sit to Stand: Min guard         General transfer comment: Min guard to rise from edge of bed and toilet. Cues for hand placement  Ambulation/Gait Ambulation/Gait assistance: Min guard Gait Distance (Feet): 100 Feet Assistive device: Rolling walker (2 wheeled) Gait Pattern/deviations: Step-through pattern;Decreased stride length;Narrow base of support;Decreased  dorsiflexion - right;Decreased dorsiflexion - left     General Gait Details: Pt requiring min guard for safety, increased bilateral knee flexion during mid stance but no knee buckle noted, no overt LOB   Stairs             Wheelchair Mobility    Modified Rankin (Stroke Patients Only) Modified Rankin (Stroke Patients Only) Pre-Morbid Rankin Score: Moderate disability Modified Rankin: Moderately severe disability     Balance Overall balance assessment: Needs assistance Sitting-balance support: Feet unsupported;No upper extremity supported Sitting balance-Leahy Scale: Fair     Standing balance support: Bilateral upper extremity supported;Single extremity supported Standing balance-Leahy Scale: Poor Standing balance comment: reliant on external support when standing at sink to brush teeth                            Cognition Arousal/Alertness: Awake/alert Behavior During Therapy: WFL for tasks assessed/performed Overall Cognitive Status: Impaired/Different from baseline Area of Impairment: Memory;Safety/judgement;Problem solving                     Memory: Decreased short-term memory;Decreased recall of precautions   Safety/Judgement: Decreased awareness of safety;Decreased awareness of deficits   Problem Solving: Difficulty sequencing;Requires verbal cues General Comments: continues to require cues for problem solving with multistep tasks, safety, and sequencing.      Exercises      General Comments        Pertinent Vitals/Pain Pain Assessment: No/denies pain Faces Pain Scale: No hurt    Home Living     Available Help at Discharge: Family;Available PRN/intermittently Type of Home: (townhome)  Prior Function            PT Goals (current goals can now be found in the care plan section) Acute Rehab PT Goals Patient Stated Goal: talk to my wife about plan Potential to Achieve Goals: Good Progress towards PT goals:  Progressing toward goals    Frequency    Min 3X/week      PT Plan Discharge plan needs to be updated    Co-evaluation PT/OT/SLP Co-Evaluation/Treatment: Yes Reason for Co-Treatment: To address functional/ADL transfers PT goals addressed during session: Mobility/safety with mobility OT goals addressed during session: ADL's and self-care;Proper use of Adaptive equipment and DME;Strengthening/ROM      AM-PAC PT "6 Clicks" Mobility   Outcome Measure  Help needed turning from your back to your side while in a flat bed without using bedrails?: None Help needed moving from lying on your back to sitting on the side of a flat bed without using bedrails?: None Help needed moving to and from a bed to a chair (including a wheelchair)?: A Little Help needed standing up from a chair using your arms (e.g., wheelchair or bedside chair)?: A Little Help needed to walk in hospital room?: A Little Help needed climbing 3-5 steps with a railing? : A Lot 6 Click Score: 19    End of Session Equipment Utilized During Treatment: Gait belt Activity Tolerance: Patient tolerated treatment well Patient left: with call bell/phone within reach;in chair;with chair alarm set Nurse Communication: Mobility status PT Visit Diagnosis: Other abnormalities of gait and mobility (R26.89)     Time: [id number] PT Time Calculation (min) (ACUTE ONLY): 38 min  Charges:  $Therapeutic Activity: 8-22 mins                     Ellamae Sia, PT, DPT Acute Rehabilitation Services Pager [phone] Office [phone]    Willy Eddy 11/28/2019, 12:52 PM

## 2019-12-01 ENCOUNTER — Telehealth: Payer: Self-pay

## 2019-12-01 ENCOUNTER — Ambulatory Visit (INDEPENDENT_AMBULATORY_CARE_PROVIDER_SITE_OTHER): Payer: Medicare Other | Admitting: Neurology

## 2019-12-01 ENCOUNTER — Encounter: Payer: Self-pay | Admitting: Neurology

## 2019-12-01 ENCOUNTER — Other Ambulatory Visit: Payer: Self-pay

## 2019-12-01 VITALS — BP 108/60 | HR 86 | Temp 97.3°F | Ht 70.0 in | Wt 132.0 lb

## 2019-12-01 DIAGNOSIS — R404 Transient alteration of awareness: Secondary | ICD-10-CM | POA: Diagnosis not present

## 2019-12-01 LAB — CULTURE, BLOOD (ROUTINE X 2)
Culture: NO GROWTH
Culture: NO GROWTH
Special Requests: ADEQUATE
Special Requests: ADEQUATE

## 2019-12-01 MED ORDER — LAMOTRIGINE 25 MG PO TABS: ORAL_TABLET | ORAL | 1 refills | Status: AC

## 2019-12-01 NOTE — Telephone Encounter (Signed)
Patient daughter [name] called to touch base in regards to todays visit. She would like to get more information on what his plan of care is.  Please follow up.

## 2019-12-01 NOTE — Telephone Encounter (Signed)
I called and left a message for the daughter, the patient is having multiple events of transient alteration of awareness, aphasia and weakness that could be consistent with seizures.  He will be treated empirically with seizure medication, in this case Lamictal.  The purpose of Lamictal is both for treatment and to help make a diagnosis.  We will follow him in the office.

## 2019-12-01 NOTE — Patient Instructions (Signed)
Begin vitamin b12 1000 mcg a day.  We will start Lamictal for possible seizures, call when you have been on 3 tablets (75 mg) twice a day for 2 weeks.   Lamictal (lamotrigine) is a seizure medication that occasionally may be used for other purposes such as peripheral neuropathy pain or certain types of headache. This medication is relatively safe, but occasionally side effects can occur. A skin rash may occur when first starting the medication. As with any seizure medication, depression may worsen on the drug. Other potential side effects include dizziness, headache, drowsiness or insomnia, decreased concentration, or stomach upset. This medication may also be used as a mood stabilizer. If you believe that you are having side effects on the medication, please contact our office.

## 2019-12-01 NOTE — Telephone Encounter (Signed)
Pt. Was called to be scheduled for a hospital f/u due to recently being in the hospital for TIA and he was scheduled on 12/05/19. The pts. Medications were gone over and reconciled.

## 2019-12-01 NOTE — Progress Notes (Signed)
Reason for visit: Transient alteration of awareness  Referring physician: [facility]  Corey Oneill is a 84 y.o. male  History of present illness:  Mr. Corey Oneill is an 84 year old right-handed white male with a history of prostate cancer, he has had a transcatheter aortic valve replacement.  He has a history of ITP and is on methotrexate, the most recent blood work has shown evidence of pancytopenia.  The patient has failure to thrive, he has been losing weight over the last 4 to 6 months and he has a low albumin level at this point.  The patient recently was found to have a low B12 level but he is not on B12 supplementation.  He has had over the last 3 to 4 months episodes of transient speech problems associated with confusion.  He may get weak on the right side with the events.  The patient himself has no recollection of the episodes, he claims that if he were alone he would not know that he had any episode.  The wife who comes with him today has noted that he is having at least 2 or 3 such episodes a week at this point, he has had probably greater than 30 such events over the last several months.  The episodes last greater than 30 minutes and fully resolve.  Over the last year, the patient has developed a gradual worsening of memory problems.  He has had to use a walker for ambulation over the last 2 months.  He may fall on occasion.  He has not hit his head or bumped his head with the falls.  He was recently mid to the hospital on 25 November 2019, he underwent a stroke work-up with a CT scan of the brain and CT angiogram that did not show any acute changes and no evidence of large vessel blockages.  He underwent a prolonged EEG study that showed some background slowing but no definite epileptiform discharges.  The patient was discharged to home, he comes here for further evaluation.  The patient reports no dizziness or headaches.  He reports no residual numbness or weakness or vision changes between  the episodes.  Once again, he has no recollection of the actual event, he has no warning of the event.  Past Medical History:  Diagnosis Date  . Abdominal aortic atherosclerosis ([place])   . Anginal pain ([place])    last noted 08/17/19  . Bone cancer ([place])    Left hip  . BPPV (benign paroxysmal positional vertigo)   . Bullous pemphigoid   . CAD (coronary artery disease)    a.  LHC 8/16: Mid to distal LAD 30%, OM1 40%, proximal mid RCA 40%, distal RCA 60% >> FFR 0.69  >> PCI: 3 x 15 mm Resolute DES  . Depression   . Esophageal reflux   . Grade I diastolic dysfunction 123456   Noted on ECHO   . H/O blood clots    "get them in my stool and urine" (11/12/2015)  . Heart murmur   . Hepatic lesion 12/08/2015   Stable 8 mm right hepatic lesion  . History of aortic stenosis    a. peak to peak gradient by LHC 8/16:  37 mmHg (moderately severe)   . History of appendicitis 2016  . History of blood transfusion 12/2018  . History of herpes labialis   . History of ITP    2018, thrombocytopenia  . History of kidney stones   . Hx of radiation therapy 04/14/13-06/09/13  prostate 7800 cGy, 40 sessions, seminal vesicles 5600 cGy 40 sessions  . Hydrocele 2006   Small  . Hyperlipidemia   . Insomnia    resolved  . LVH (left ventricular hypertrophy)    Moderate, noted on ECHO 09/2018  . Malignant melanoma in situ ([place]) 09/04/2016   Right neck and chest  . Metastasis from malignant neoplasm of prostate ([place]) 02/25/2019  . Prostate cancer ([place]) dx'd 2014  . Radiation proctitis   . Renal mass 2017   Bilateral renal masses  . S/P skin biopsy 04/09/2019   subepidermal cell poor vesicle , Linear IGG and C3 the basement membrane  . Suprapubic catheter ([place])   . Wears hearing aid in both ears     Past Surgical History:  Procedure Laterality Date  . CARDIAC CATHETERIZATION N/A 04/21/2015   Procedure: Right/Left Heart Cath and Coronary Angiography;  Surgeon: [name], MD;  Location: [place] [facility];  Service: Cardiovascular;  Laterality: N/A;  . CARDIAC CATHETERIZATION N/A 06/18/2015   Procedure: Intravascular Pressure Wire/FFR Study;  Surgeon: [name], MD;  Location: [facility];  Service: Cardiovascular;  Laterality: N/A;  . CARDIAC CATHETERIZATION N/A 06/18/2015   Procedure: Coronary Stent Intervention;  Surgeon: [name], MD;  Location: [facility];  Service: Cardiovascular;  Laterality: N/A;  . CARDIAC CATHETERIZATION N/A 06/18/2015   Procedure: Right/Left Heart Cath and Coronary Angiography;  Surgeon: [name], MD;  Location: [facility];  Service: Cardiovascular;  Laterality: N/A;  . CARDIAC CATHETERIZATION N/A 06/23/2015   Procedure: Left Heart Cath and Cors/Grafts Angiography;  Surgeon: [name], MD;  Location: [facility];  Service: Cardiovascular;  Laterality: N/A;  . CARDIAC CATHETERIZATION N/A 01/17/2016   Procedure: Right/Left Heart Cath and Coronary Angiography;  Surgeon: [name], MD;  Location: [facility];  Service: Cardiovascular;  Laterality: N/A;  . CATARACT EXTRACTION, BILATERAL    . COLONOSCOPY  06/26/2017  . CYSTOSCOPY WITH BIOPSY N/A 07/30/2018   Procedure: CYSTOSCOPY WITH BIOPSY/FULGURATION, CYSTOLTHOLAPAXY;  Surgeon: [name], MD;  Location: [facility];  Service: Urology;  Laterality: N/A;  . CYSTOSCOPY WITH URETHRAL DILATATION N/A 08/18/2019   Procedure: CYSTOSCOPY WITH URETHRAL DILATATION USING BALLOON OR LASER/ SPURO PUBIC TUBE CHANGE LASER EXCISION OF URETHRAL STRICTURE RETROGRADE URETHROGRAM ANTEGRADE CYSTOGRAM;  Surgeon: [name], MD;  Location: [facility];  Service: Urology;  Laterality: N/A;  . FRACTURE SURGERY    . INGUINAL HERNIA REPAIR     patient does not remember this procedure  . LAPAROSCOPIC APPENDECTOMY N/A 11/16/2015   Procedure: APPENDECTOMY LAPAROSCOPIC;  Surgeon: [name], MD;  Location: [facility];  Service: General;  Laterality: N/A;  . LEFT HEART CATH AND  CORONARY ANGIOGRAPHY N/A 12/26/2016   Procedure: Left Heart Cath and Coronary Angiography;  Surgeon: Troy Sine, MD;  Location: [facility];  Service: Cardiovascular;  Laterality: N/A;  . MELANOMA EXCISION Right 10/24/2016   Procedure: WIDE EXCISION MELANOMA RIGHT NECK AND RIGHT CHEST TIMES 2;  Surgeon: [name], MD;  Location: [facility];  Service: General;  Laterality: Right;  right medial and lateral lesion of chest and right neck  . NASAL FRACTURE SURGERY     "broken years ago; several ORs to correct it"  . NASAL SEPTUM SURGERY    . PROSTATE BIOPSY  2014   "needle biopsy"  . TEE WITHOUT CARDIOVERSION N/A 02/15/2016   Procedure: TRANSESOPHAGEAL ECHOCARDIOGRAM (TEE);  Surgeon: [name], MD;  Location: [facility];  Service: Open Heart Surgery;  Laterality: N/A;  . TEE WITHOUT CARDIOVERSION N/A 08/26/2019   Procedure: TRANSESOPHAGEAL ECHOCARDIOGRAM (TEE);  Surgeon: Pixie Casino, MD;  Location: [facility];  Service: Cardiovascular;  Laterality: N/A;  . TONSILLECTOMY    . TRANSCATHETER AORTIC VALVE REPLACEMENT, TRANSFEMORAL  02/15/2016  . TRANSCATHETER AORTIC VALVE REPLACEMENT, TRANSFEMORAL N/A 02/15/2016   Procedure: TRANSCATHETER AORTIC VALVE REPLACEMENT, TRANSFEMORAL;  Surgeon: [name], MD;  Location: [facility];  Service: Open Heart Surgery;  Laterality: N/A;  . TRANSURETHRAL RESECTION OF PROSTATE  12/23/2018   Procedure: CYSTOSCOPY WITH  BLADDER BIOPSY WITH FULGERATION/ TRANSURETHRAL RESECTION PROSTATE  AND PROSTATE BIOPSY;  Surgeon: [name], MD;  [facility];  Service: Urology;;    Family History  Problem Relation Age of Onset  . Brain cancer Father   . Lymphoma Mother   . Depression Daughter     Social history:  reports that he has quit smoking. His smoking use included cigarettes. He smoked 0.00 packs per day. He has never used smokeless tobacco. He reports current alcohol use. He reports that he does not use drugs.  Medications:    Prior to Admission medications   Medication Sig Start Date End Date Taking? Authorizing Provider  acetaminophen (TYLENOL) 500 MG tablet Take 500 mg by mouth every 6 (six) hours as needed for moderate pain.    Yes [provider]  amoxicillin (AMOXIL) 500 MG tablet Take 1 tablet (500 mg total) by mouth 2 (two) times daily. 11/07/19  Yes Comer, Okey Regal, MD  aspirin EC 81 MG tablet Take 81 mg by mouth every evening.    Yes [provider]  Calcium-Phosphorus-Vitamin D (CITRACAL +D3 PO) Take 1 tablet by mouth at bedtime.   Yes [provider]  carvedilol (COREG) 3.125 MG tablet TAKE 1 TABLET BY MOUTH TWICE DAILY. Patient taking differently: Take 3.125 mg by mouth 2 (two) times daily.  06/26/19  Yes [name], MD  doxycycline (MONODOX) 100 MG capsule Take 100 mg by mouth 2 (two) times daily.  11/18/19  Yes [provider]  DULoxetine (CYMBALTA) 30 MG capsule Take 30 mg by mouth daily.   Yes [provider]  enzalutamide (XTANDI) 40 MG capsule Take 160 mg by mouth daily with supper.    Yes [provider]  ferrous sulfate (FEROSUL) 325 (65 FE) MG tablet Take 325 mg by mouth daily with breakfast.   Yes [provider]  folic acid (FOLVITE) 1 MG tablet Take 1 mg by mouth See admin instructions. Take 1 mg by mouth once a day on Sun/Mon/Wed/Thurs/Fri/Sat (not on Tuesdays)   Yes [provider]  methotrexate (RHEUMATREX) 2.5 MG tablet Take 10 mg by mouth every Tuesday.    Yes [provider]  MULTIPLE VITAMIN PO Take 1 tablet by mouth daily.   Yes [provider]  nitroGLYCERIN (NITROSTAT) 0.4 MG SL tablet 1 TAB UNDER TONGUE AS NEEDED FOR CHEST PAIN. MAY REPEAT EVERY 5 MIN FOR A TOTAL OF 3 DOSES. Patient taking differently: Place 0.4 mg under the tongue every 5 (five) minutes as needed for chest pain.  07/07/19  Yes [name], MD  rosuvastatin (CRESTOR) 10 MG tablet TAKE 1 TABLET ONCE DAILY. Patient taking  differently: Take 10 mg by mouth daily.  05/26/19  Yes Tysinger, Camelia Eng, PA-C  isosorbide mononitrate (IMDUR) 30 MG 24 hr tablet Take 1 tablet (30 mg total) by mouth daily. 10/22/18 11/25/19  Richardson Dopp T, PA-C  Allergies  Allergen Reactions  . Prednisone Other (See Comments)    Makes the patient depressed    ROS:  Out of a complete 14 system review of symptoms, the patient complains only of the following symptoms, and all other reviewed systems are negative.  Transient confusion, speech disturbance Walking difficulty Memory disturbance Excessive daytime drowsiness Decreased appetite, weight loss  Blood pressure 108/60, pulse 86, temperature (!) 97.3 F (36.3 C), height 5\' 10"  (1.778 m), weight 132 lb (59.9 kg).  Physical Exam  General: The patient is alert and cooperative at the time of the examination.  The patient appears to be thin.  Eyes: Pupils are equal, round, and reactive to light. Discs are flat bilaterally.  Neck: The neck is supple, no carotid bruits are noted.  Respiratory: The respiratory examination is clear.  Cardiovascular: The cardiovascular examination reveals a regular rate and rhythm, no obvious murmurs or rubs are noted.  Skin: Extremities are without significant edema.  The balls of the feet appear to be normal blue and purple in color.  Neurologic Exam  Mental status: The patient is alert and oriented x 3 at the time of the examination. The Mini-Mental status examination done today shows total score of 24/30.  The patient is able to name 10 four-legged animals in 60 seconds.  Cranial nerves: Facial symmetry is present. There is good sensation of the face to pinprick and soft touch bilaterally. The strength of the facial muscles and the muscles to head turning and shoulder shrug are normal bilaterally. Speech is well enunciated, no aphasia or dysarthria is noted. Extraocular movements are full. Visual fields are full. The tongue is midline, and  the patient has symmetric elevation of the soft palate. No obvious hearing deficits are noted.  Motor: The motor testing reveals 5 over 5 strength of all 4 extremities. Good symmetric motor tone is noted throughout.  Sensory: Sensory testing is intact to pinprick, soft touch, vibration sensation, and position sense on all 4 extremities, with exception of some decreased vibration sensation and position sense in both feet. No evidence of extinction is noted.  Coordination: Cerebellar testing reveals good finger-nose-finger and heel-to-shin bilaterally.  Gait and station: Gait is wide-based, unsteady.  The patient usually uses a walker for ambulation.  Tandem gait was not attempted.  Romberg is unsteady, the patient has some tendency to go backwards.  Reflexes: Deep tendon reflexes are symmetric, but are somewhat depressed bilaterally. Toes are downgoing bilaterally.   CTA head and neck 11/25/19:  IMPRESSION: CTA neck:  1. The bilateral common and internal carotid arteries are patent within the neck without significant stenosis. Mild plaque at the carotid bifurcations and within the proximal ICAs. 2. Significantly dominant left vertebral artery with redemonstrated moderate/severe ostial stenosis. The vertebral arteries are otherwise patent within the neck without significant stenosis. 3. Ill-defined airspace opacity within the posterior aspect of the partially imaged right upper lobe suspicious for pneumonia.  CTA head:  1. No intracranial large vessel occlusion or proximal high-grade arterial stenosis identified. 2. Redemonstrated multifocal high-grade stenoses within distal left anterior cerebral artery branches. 3. Calcified atherosclerosis within the intracranial ICAs, but no more than mild stenosis.  CT perfusion head:  1. The perfusion software detects no core infarct. 2. The perfusion software detects no region of critically hypoperfused parenchyma utilizing a Tmax>6  seconds threshold.  * CT scan images were reviewed online. I agree with the written report.   EEG 11/27/19:  IMPRESSION: This study issuggestive of moderate diffuse encephalopathy, non  specific to etiology.No seizures or epileptiform discharges were seen throughout the recording.    Assessment/Plan:  1.  Episodes of transient alteration of awareness, aphasia and right-sided weakness  2.  Failure to thrive  3.  Memory disorder  4.  Gait disorder  5.  Low B12 level  The patient will go on B12 supplementation taking 1000 mcg tablets daily.  The patient is having transient episodes of altered awareness that could potentially represent seizures.  We will give an empiric trial on Lamictal.  Keppra was considered but the patient apparently is having significant issues with drowsiness during the day.  He has failure to thrive, he is losing weight and has a poor nutritional level.  The patient will contact the office when he gets to 75 mg of Lamictal twice daily for 2 weeks, we will go up to 100 mg tablets at that point.  The patient will follow-up here in 3 months.  They will call for any concerns.  We will follow the memory issues over time.  Jill Alexanders MD 12/01/2019 10:43 AM  [facility] [address] [place], [place] [address]  Phone [phone] Fax [phone]

## 2019-12-02 ENCOUNTER — Telehealth: Payer: Self-pay

## 2019-12-02 NOTE — Telephone Encounter (Signed)
ok 

## 2019-12-02 NOTE — Telephone Encounter (Signed)
[facility] is requesting to have skilled nursing re added to pt  Visit. Please advise if this is ok. [place]

## 2019-12-03 ENCOUNTER — Other Ambulatory Visit: Payer: Self-pay | Admitting: Family Medicine

## 2019-12-03 ENCOUNTER — Ambulatory Visit: Payer: Medicare Other | Admitting: Diagnostic Neuroimaging

## 2019-12-03 NOTE — Telephone Encounter (Signed)
[facility] is requesting to fill pt duloxetine. Please advise [facility]

## 2019-12-03 NOTE — Telephone Encounter (Signed)
Pts daughter called and wanted this medication sent in to [facility]

## 2019-12-05 ENCOUNTER — Encounter: Payer: Self-pay | Admitting: Family Medicine

## 2019-12-05 ENCOUNTER — Other Ambulatory Visit: Payer: Medicare Other | Admitting: *Deleted

## 2019-12-05 ENCOUNTER — Telehealth: Payer: Self-pay

## 2019-12-05 ENCOUNTER — Other Ambulatory Visit: Payer: Self-pay

## 2019-12-05 ENCOUNTER — Ambulatory Visit (INDEPENDENT_AMBULATORY_CARE_PROVIDER_SITE_OTHER): Payer: Medicare Other | Admitting: Family Medicine

## 2019-12-05 ENCOUNTER — Other Ambulatory Visit: Payer: Medicare Other | Admitting: Licensed Clinical Social Worker

## 2019-12-05 DIAGNOSIS — G459 Transient cerebral ischemic attack, unspecified: Secondary | ICD-10-CM | POA: Diagnosis not present

## 2019-12-05 DIAGNOSIS — Z515 Encounter for palliative care: Secondary | ICD-10-CM

## 2019-12-05 DIAGNOSIS — R4701 Aphasia: Secondary | ICD-10-CM

## 2019-12-05 MED ORDER — DULOXETINE HCL 30 MG PO CPEP: ORAL_CAPSULE | ORAL | 2 refills | Status: DC

## 2019-12-05 NOTE — Progress Notes (Signed)
   Subjective:    Patient ID: Corey Oneill, male    DOB: 1936-04-18, 84 y.o.   MRN: [id number]  HPI Documentation for virtual telephone encounter. Interactive audio and video telecommunications were attempted between this provider and patient, however  they did not have access to video capability.  We continued and completed visit with audio only.  The patient was located at home. The provider was located in the office. The patient did consent to this visit and is aware of possible charges through their insurance for this visit. This virtual service is not related to other E/M service within previous 7 days. The phone call was with his wife.  He is now at home.  They do have home care coming in and palliative care is scheduled to see him later today.  She states that he is quite weak.  The main concern today is to make sure he gets his Cymbalta.  He has apparently been on that and stable for a long period of time. He was discharged on January 15.  The discharge summary was reviewed.  It did show evidence of aphasia however the work-up was negative.  He is to follow-up here in about a week for blood work.  His wife indicated no particular concerns.   Review of Systems     Objective:   Physical Exam Patient not examined.      Assessment & Plan:  TIA (transient ischemic attack)  Aphasia I will arrange for him to come here in another week for blood work. Cymbalta was called in as per wife's request.  He has been stable on this medication.

## 2019-12-05 NOTE — Telephone Encounter (Signed)
Pt daughter wants to know about pt duloxetine. Pt daughter was advised it was fill for only 7 days. Please advise and if you could please send in for 30 days. Pt daughter was also advised of the palliative  care office may need to draw pt blood once I speak to their office and get inf to fax over orders. [place]

## 2019-12-05 NOTE — Telephone Encounter (Signed)
[facility] can draw labs . Please write out an order and advise so it can be faxed over to [phone]. Thanks [employer]

## 2019-12-06 ENCOUNTER — Encounter (INDEPENDENT_AMBULATORY_CARE_PROVIDER_SITE_OTHER): Payer: Medicare Other

## 2019-12-06 DIAGNOSIS — I4891 Unspecified atrial fibrillation: Secondary | ICD-10-CM

## 2019-12-08 DIAGNOSIS — I493 Ventricular premature depolarization: Secondary | ICD-10-CM | POA: Diagnosis not present

## 2019-12-08 DIAGNOSIS — E7800 Pure hypercholesterolemia, unspecified: Secondary | ICD-10-CM | POA: Diagnosis not present

## 2019-12-08 DIAGNOSIS — M47812 Spondylosis without myelopathy or radiculopathy, cervical region: Secondary | ICD-10-CM | POA: Diagnosis not present

## 2019-12-08 DIAGNOSIS — I2510 Atherosclerotic heart disease of native coronary artery without angina pectoris: Secondary | ICD-10-CM | POA: Diagnosis not present

## 2019-12-08 DIAGNOSIS — C7951 Secondary malignant neoplasm of bone: Secondary | ICD-10-CM | POA: Diagnosis not present

## 2019-12-08 DIAGNOSIS — I5032 Chronic diastolic (congestive) heart failure: Secondary | ICD-10-CM | POA: Diagnosis not present

## 2019-12-08 DIAGNOSIS — I0981 Rheumatic heart failure: Secondary | ICD-10-CM | POA: Diagnosis not present

## 2019-12-08 DIAGNOSIS — E7849 Other hyperlipidemia: Secondary | ICD-10-CM | POA: Diagnosis not present

## 2019-12-08 DIAGNOSIS — E876 Hypokalemia: Secondary | ICD-10-CM | POA: Diagnosis not present

## 2019-12-08 DIAGNOSIS — E1151 Type 2 diabetes mellitus with diabetic peripheral angiopathy without gangrene: Secondary | ICD-10-CM | POA: Diagnosis not present

## 2019-12-08 DIAGNOSIS — K627 Radiation proctitis: Secondary | ICD-10-CM | POA: Diagnosis not present

## 2019-12-08 DIAGNOSIS — A4181 Sepsis due to Enterococcus: Secondary | ICD-10-CM | POA: Diagnosis not present

## 2019-12-08 DIAGNOSIS — I700 Atherosclerosis of aorta: Secondary | ICD-10-CM | POA: Diagnosis not present

## 2019-12-08 DIAGNOSIS — R29810 Facial weakness: Secondary | ICD-10-CM | POA: Diagnosis not present

## 2019-12-08 DIAGNOSIS — E041 Nontoxic single thyroid nodule: Secondary | ICD-10-CM | POA: Diagnosis not present

## 2019-12-08 DIAGNOSIS — R4701 Aphasia: Secondary | ICD-10-CM | POA: Diagnosis not present

## 2019-12-08 DIAGNOSIS — G8191 Hemiplegia, unspecified affecting right dominant side: Secondary | ICD-10-CM | POA: Diagnosis not present

## 2019-12-08 DIAGNOSIS — D630 Anemia in neoplastic disease: Secondary | ICD-10-CM | POA: Diagnosis not present

## 2019-12-08 DIAGNOSIS — I081 Rheumatic disorders of both mitral and tricuspid valves: Secondary | ICD-10-CM | POA: Diagnosis not present

## 2019-12-08 DIAGNOSIS — Y842 Radiological procedure and radiotherapy as the cause of abnormal reaction of the patient, or of later complication, without mention of misadventure at the time of the procedure: Secondary | ICD-10-CM | POA: Diagnosis not present

## 2019-12-08 DIAGNOSIS — C61 Malignant neoplasm of prostate: Secondary | ICD-10-CM | POA: Diagnosis not present

## 2019-12-08 DIAGNOSIS — N390 Urinary tract infection, site not specified: Secondary | ICD-10-CM | POA: Diagnosis not present

## 2019-12-08 DIAGNOSIS — I110 Hypertensive heart disease with heart failure: Secondary | ICD-10-CM | POA: Diagnosis not present

## 2019-12-08 DIAGNOSIS — I330 Acute and subacute infective endocarditis: Secondary | ICD-10-CM | POA: Diagnosis not present

## 2019-12-08 DIAGNOSIS — G319 Degenerative disease of nervous system, unspecified: Secondary | ICD-10-CM | POA: Diagnosis not present

## 2019-12-08 NOTE — Telephone Encounter (Signed)
Done

## 2019-12-08 NOTE — Progress Notes (Signed)
COMMUNITY PALLIATIVE CARE SW NOTE  PATIENT NAME: Corey Oneill DOB: May 26, 1936 MRN: [id number]  PRIMARY CARE PROVIDER: [name], MD  RESPONSIBLE PARTY:  Acct ID - Guarantor Home Phone Work Phone Relationship Acct Type  [id number] [name]  Self P/F     [address], [place], [place] [address]     PLAN OF CARE and INTERVENTIONS:             1. GOALS OF CARE/ ADVANCE CARE PLANNING:  Patient's goal is to remain in his home with his wife, Corey Oneill.  Patient has a DNR and MOST form. 2. SOCIAL/EMOTIONAL/SPIRITUAL ASSESSMENT/ INTERVENTIONS:  SW and Palliative Care RN, [name], met with patient and his wife, Corey Oneill, in their home.  Patient had just returned from a hair cut.  Discussed patient's recent hospitalization.  Patient appeared more alert during this visit and engaged in conversation.  Corey Oneill stated he was more confused, however.  He appeared to respond positively to life review. 3. PATIENT/CAREGIVER EDUCATION/ COPING:  Provided education to patient's wife regarding the Palliative Care program.  She copes by problem-solving. 4. PERSONAL EMERGENCY PLAN:  Family will call EMS. 5. COMMUNITY RESOURCES COORDINATION/ HEALTH CARE NAVIGATION:  [facility] provide personal care. 6. FINANCIAL/LEGAL CONCERNS/INTERVENTIONS:  None.     SOCIAL HX:  Social History   Tobacco Use  . Smoking status: Former Smoker    Packs/day: 0.00    Types: Cigarettes  . Smokeless tobacco: Never Used  . Tobacco comment: "quit smoking cigarettes in the 1960s; quit smoking cigars in the 1990s  Substance Use Topics  . Alcohol use: Yes    Comment: 2 DRINKS PER DAY    CODE STATUS:  DNR ADVANCED DIRECTIVES: LW, HCPOA MOST FORM COMPLETE:  Yes HOSPICE EDUCATION PROVIDED:  No PPS:  Patient's appetite is normal.  Patient uses a walker to ambulate. Duration of visit and documentation:  75 minutes.      [name] Xyler Terpening, LCSW

## 2019-12-08 NOTE — Telephone Encounter (Signed)
Done and faxed over. [place]

## 2019-12-09 DIAGNOSIS — R4701 Aphasia: Secondary | ICD-10-CM | POA: Diagnosis not present

## 2019-12-09 DIAGNOSIS — N390 Urinary tract infection, site not specified: Secondary | ICD-10-CM | POA: Diagnosis not present

## 2019-12-09 DIAGNOSIS — G8191 Hemiplegia, unspecified affecting right dominant side: Secondary | ICD-10-CM | POA: Diagnosis not present

## 2019-12-09 DIAGNOSIS — A4181 Sepsis due to Enterococcus: Secondary | ICD-10-CM | POA: Diagnosis not present

## 2019-12-09 DIAGNOSIS — R29810 Facial weakness: Secondary | ICD-10-CM | POA: Diagnosis not present

## 2019-12-09 DIAGNOSIS — I330 Acute and subacute infective endocarditis: Secondary | ICD-10-CM | POA: Diagnosis not present

## 2019-12-09 NOTE — Telephone Encounter (Signed)
I called Corey Oneill pts daughter back at [phone]. She receive Dr. Jannifer Franklin vm about the lamictal, possible seizures medication She was not at the visit with her father last week. While the pt was hospitalized she was told by the MD he possibly could have dementia. She wanted the visit to focus on dementia and seizures. I explain Dr. Jannifer Franklin will return on Monday to review chart. He can call her to discuss her concerns about more work up for dementia if needed. She verbalized understanding.

## 2019-12-09 NOTE — Progress Notes (Signed)
COMMUNITY PALLIATIVE CARE RN NOTE  PATIENT NAME: Corey Oneill DOB: May 19, 1936 MRN: [id number]  PRIMARY CARE PROVIDER: [name], MD  RESPONSIBLE PARTY:  Acct ID - Guarantor Home Phone Work Phone Relationship Acct Type  [id number] [name]  Self P/F     [place], [place], [place] [address]   Covid-19 Pre-screening Negative  PLAN OF CARE and INTERVENTION:  1. ADVANCE CARE PLANNING/GOALS OF CARE: Goal is for patient to remain at home with his wife. He has a DNR. 2. PATIENT/CAREGIVER EDUCATION: Symptom Management 3. DISEASE STATUS: Joint visit made with Palliative care SW, Lynn Duffy. Met with patient and his wife in their home. Patient just returned home from getting a hair cut. He denies pain. He speaks about his recent hospitalization from 11/25/19 to 11/28/19. Wife reports that patient couldn't talk and became extremely weak. He also had a right facial droop along with right sided weakness. Wife states that they feel that patient may be having TIAs or seizures. He was seen by Dr. Jannifer Franklin this week and prescribed Lamictal. She also states that his Neurologist recently ordered Vitamin B12 for patient d/t his low albumin of 2.9. Patient reports that he feels fine today. His biggest concern is the fact that his voice has become much weaker and less recognizable to others over the phone d/t this. He remains ambulatory using his walker. No recent falls. His wife stands by during ambulation for safety. He had a PT session this am. He feels that this is beneficial. He remains able to shower and dress himself. He has a shower chair, elevated toilet seat and grab bars in the bathroom. His wife states that it is taking longer for him to perform these tasks. His appetite is fair. He does have some swallowing difficulties and issues with regurgitation. He takes his medications whole, one pill at a time. He is drinking 1-2 Boost drinks per day for nutritional supplementation. She is  encouraging patient to drink more water. He continues on Xtandi for prostate cancer. He is sleeping well during the throughout the night. He does have some intermittent confusion and forgetfulness, but was able to engage in meaningful conversation today. Will continue to monitor.  HISTORY OF PRESENT ILLNESS: This is a 84 yo male who resides at home with his wife. Palliative care continues to follow patient. Team to continue to visit monthly and PRN.   CODE STATUS: DNR ADVANCED DIRECTIVES: N MOST FORM: yes PPS: 50%   PHYSICAL EXAM:   VITALS: Today's Vitals   12/05/19 1348  BP: 105/68  Pulse: 74  Resp: 18  Temp: 97.8 F (36.6 C)  TempSrc: Temporal  SpO2: 99%  PainSc: 0-No pain    LUNGS: clear to auscultation  CARDIAC: Cor RRR EXTREMITIES: No edema SKIN: Small scab noted to forehead  NEURO: Alert and oriented x 3, intermittent confusion, forgetful, generalized weakness, ambulatory w/walker   (Duration of visit and documentation 75 minutes)   [name], RN BSN

## 2019-12-09 NOTE — Telephone Encounter (Signed)
Patient daughter called requesting a CB in regards to the patients last visit, daughter was informed a VM was left by Dr.willis and she states it did not answer her question.  Patient daughter would like to know what would need to be done in order to get a full neurological evaluation for dementia for the patient      Please follow up.

## 2019-12-10 ENCOUNTER — Ambulatory Visit: Payer: Medicare Other | Admitting: Physician Assistant

## 2019-12-10 ENCOUNTER — Telehealth: Payer: Self-pay

## 2019-12-10 ENCOUNTER — Telehealth: Payer: Self-pay | Admitting: Family Medicine

## 2019-12-10 DIAGNOSIS — I330 Acute and subacute infective endocarditis: Secondary | ICD-10-CM | POA: Diagnosis not present

## 2019-12-10 DIAGNOSIS — R29810 Facial weakness: Secondary | ICD-10-CM | POA: Diagnosis not present

## 2019-12-10 DIAGNOSIS — N390 Urinary tract infection, site not specified: Secondary | ICD-10-CM | POA: Diagnosis not present

## 2019-12-10 DIAGNOSIS — A4181 Sepsis due to Enterococcus: Secondary | ICD-10-CM | POA: Diagnosis not present

## 2019-12-10 DIAGNOSIS — G8191 Hemiplegia, unspecified affecting right dominant side: Secondary | ICD-10-CM | POA: Diagnosis not present

## 2019-12-10 DIAGNOSIS — R4701 Aphasia: Secondary | ICD-10-CM | POA: Diagnosis not present

## 2019-12-10 NOTE — Telephone Encounter (Signed)
I called the daughter again, left another message.  The patient has had blood work, CT of the head, CT angiogram, work-up for memory issues has been completed.  We are trying to determine whether he has seizure events, for this reason we would hold off on initiation of treatment for memory with Aricept or Exelon.  We will follow the memory issues over time.

## 2019-12-10 NOTE — Telephone Encounter (Signed)
Pt has a visit next Tuesday with you and pts daughter [name] wants to see if she could speak with you before or after visit. She said she does not feel comfortable discussing his health issues in front of him. She can be reached at [phone]

## 2019-12-10 NOTE — Telephone Encounter (Signed)
Received lab orders from PCP office. Unfortunately,Palliative care is unable to draw labs at this time. Noted that patient is followed by [facility].

## 2019-12-10 NOTE — Telephone Encounter (Signed)
Spoke with Cindie at [facility], Confirmed that patient is open with [facility] and inquired if they would be able to draw lab work. Cindie confirmed that they were able and requested that PCP office fax over lab orders. PCP office updated.

## 2019-12-11 DIAGNOSIS — I330 Acute and subacute infective endocarditis: Secondary | ICD-10-CM | POA: Diagnosis not present

## 2019-12-11 DIAGNOSIS — A4181 Sepsis due to Enterococcus: Secondary | ICD-10-CM | POA: Diagnosis not present

## 2019-12-11 DIAGNOSIS — N390 Urinary tract infection, site not specified: Secondary | ICD-10-CM | POA: Diagnosis not present

## 2019-12-11 DIAGNOSIS — R29810 Facial weakness: Secondary | ICD-10-CM | POA: Diagnosis not present

## 2019-12-11 DIAGNOSIS — R4701 Aphasia: Secondary | ICD-10-CM | POA: Diagnosis not present

## 2019-12-11 DIAGNOSIS — G8191 Hemiplegia, unspecified affecting right dominant side: Secondary | ICD-10-CM | POA: Diagnosis not present

## 2019-12-12 DIAGNOSIS — R4701 Aphasia: Secondary | ICD-10-CM | POA: Diagnosis not present

## 2019-12-12 DIAGNOSIS — N390 Urinary tract infection, site not specified: Secondary | ICD-10-CM | POA: Diagnosis not present

## 2019-12-12 DIAGNOSIS — R29810 Facial weakness: Secondary | ICD-10-CM | POA: Diagnosis not present

## 2019-12-12 DIAGNOSIS — G8191 Hemiplegia, unspecified affecting right dominant side: Secondary | ICD-10-CM | POA: Diagnosis not present

## 2019-12-12 DIAGNOSIS — A4181 Sepsis due to Enterococcus: Secondary | ICD-10-CM | POA: Diagnosis not present

## 2019-12-12 DIAGNOSIS — I330 Acute and subacute infective endocarditis: Secondary | ICD-10-CM | POA: Diagnosis not present

## 2019-12-15 ENCOUNTER — Ambulatory Visit: Payer: Medicare Other | Admitting: Family Medicine

## 2019-12-15 DIAGNOSIS — C7951 Secondary malignant neoplasm of bone: Secondary | ICD-10-CM | POA: Diagnosis not present

## 2019-12-15 DIAGNOSIS — N35012 Post-traumatic membranous urethral stricture: Secondary | ICD-10-CM | POA: Diagnosis not present

## 2019-12-15 DIAGNOSIS — C61 Malignant neoplasm of prostate: Secondary | ICD-10-CM | POA: Diagnosis not present

## 2019-12-15 DIAGNOSIS — Z192 Hormone resistant malignancy status: Secondary | ICD-10-CM | POA: Diagnosis not present

## 2019-12-15 DIAGNOSIS — N3946 Mixed incontinence: Secondary | ICD-10-CM | POA: Diagnosis not present

## 2019-12-16 ENCOUNTER — Encounter: Payer: Self-pay | Admitting: Family Medicine

## 2019-12-16 ENCOUNTER — Ambulatory Visit (INDEPENDENT_AMBULATORY_CARE_PROVIDER_SITE_OTHER): Payer: Medicare Other | Admitting: Family Medicine

## 2019-12-16 ENCOUNTER — Other Ambulatory Visit: Payer: Self-pay

## 2019-12-16 VITALS — BP 100/60 | HR 84 | Temp 97.1°F | Resp 16

## 2019-12-16 DIAGNOSIS — C799 Secondary malignant neoplasm of unspecified site: Secondary | ICD-10-CM | POA: Diagnosis not present

## 2019-12-16 DIAGNOSIS — C61 Malignant neoplasm of prostate: Secondary | ICD-10-CM | POA: Diagnosis not present

## 2019-12-16 DIAGNOSIS — F341 Dysthymic disorder: Secondary | ICD-10-CM

## 2019-12-16 DIAGNOSIS — Z7189 Other specified counseling: Secondary | ICD-10-CM | POA: Diagnosis not present

## 2019-12-16 DIAGNOSIS — G459 Transient cerebral ischemic attack, unspecified: Secondary | ICD-10-CM

## 2019-12-16 MED ORDER — DULOXETINE HCL 60 MG PO CPEP: 60.0000 mg | ORAL_CAPSULE | Freq: Every day | ORAL | 3 refills | Status: AC

## 2019-12-16 NOTE — Progress Notes (Signed)
   Subjective:    Patient ID: Corey Oneill, male    DOB: September 14, 1936, 84 y.o.   MRN: [id number]  HPI He is here with his daughter for follow-up visit.  He does need blood work after recent hospitalization.  He has had home care come out and they are helping with comfort care.  He also has PT and OT visiting.  His daughter is in the process of getting long-term care set up through an insurance plan that he has.  He is to follow-up in the near future with urology for his underlying prostate cancer.  He has been seen by palliative care.  Today he complains of continued difficulty with fatigue, decreased energy as well as appetite with weight loss.  He also is wearing depends because of bowel and bladder issues especially at night.  This does have him quite concerned.  He admits to being depressed.   Review of Systems     Objective:   Physical Exam Alert and in no distress, flat affect is noted.  Cardiac exam shows regular rhythm without murmurs or gallops.  Lungs are clear to auscultation.  Abdominal exam shows no masses or tenderness.       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  I will increase his Cymbalta to 60 mg to see if it will help him psychologically.  He will follow up with urology.  Hopefully long-term care will be able to be used in the near future. I then discussed end-of-life issues with him.  We had a very open and frank discussion about end-of-life issues.  I explained that whenever he is ready to go he is certainly capable of making that decision on his own.  He was comfortable with that.  His daughter was present during the entire interview.

## 2019-12-16 NOTE — Patient Instructions (Signed)
Double your Cymbalta until it is gone and then start the new dose

## 2019-12-17 DIAGNOSIS — M25552 Pain in left hip: Secondary | ICD-10-CM | POA: Diagnosis not present

## 2019-12-17 LAB — CBC WITH DIFFERENTIAL/PLATELET
Basophils Absolute: 0 10*3/uL (ref 0.0–0.2)
Basos: 0 %
EOS (ABSOLUTE): 0 10*3/uL (ref 0.0–0.4)
Eos: 0 %
Hematocrit: 32 % — ABNORMAL LOW (ref 37.5–51.0)
Hemoglobin: 11.1 g/dL — ABNORMAL LOW (ref 13.0–17.7)
Immature Grans (Abs): 0 10*3/uL (ref 0.0–0.1)
Immature Granulocytes: 1 %
Lymphocytes Absolute: 0.8 10*3/uL (ref 0.7–3.1)
Lymphs: 10 %
MCH: 32.5 pg (ref 26.6–33.0)
MCHC: 34.7 g/dL (ref 31.5–35.7)
MCV: 94 fL (ref 79–97)
Monocytes Absolute: 0.6 10*3/uL (ref 0.1–0.9)
Monocytes: 7 %
Neutrophils Absolute: 7.1 10*3/uL — ABNORMAL HIGH (ref 1.4–7.0)
Neutrophils: 82 %
Platelets: 69 10*3/uL — CL (ref 150–450)
RBC: 3.42 x10E6/uL — ABNORMAL LOW (ref 4.14–5.80)
RDW: 13.8 % (ref 11.6–15.4)
WBC: 8.6 10*3/uL (ref 3.4–10.8)

## 2019-12-17 LAB — COMPREHENSIVE METABOLIC PANEL
ALT: 6 IU/L (ref 0–44)
AST: 13 IU/L (ref 0–40)
Albumin/Globulin Ratio: 1.5 (ref 1.2–2.2)
Albumin: 3.6 g/dL (ref 3.6–4.6)
Alkaline Phosphatase: 96 IU/L (ref 39–117)
BUN/Creatinine Ratio: 25 — ABNORMAL HIGH (ref 10–24)
BUN: 18 mg/dL (ref 8–27)
Bilirubin Total: 0.4 mg/dL (ref 0.0–1.2)
CO2: 25 mmol/L (ref 20–29)
Calcium: 8.7 mg/dL (ref 8.6–10.2)
Chloride: 96 mmol/L (ref 96–106)
Creatinine, Ser: 0.72 mg/dL — ABNORMAL LOW (ref 0.76–1.27)
GFR calc Af Amer: 101 mL/min/{1.73_m2} (ref >59)
GFR calc non Af Amer: 87 mL/min/{1.73_m2} (ref >59)
Globulin, Total: 2.4 g/dL (ref 1.5–4.5)
Glucose: 90 mg/dL (ref 65–99)
Potassium: 4.2 mmol/L (ref 3.5–5.2)
Sodium: 135 mmol/L (ref 134–144)
Total Protein: 6 g/dL (ref 6.0–8.5)

## 2019-12-18 ENCOUNTER — Telehealth: Payer: Self-pay | Admitting: Family Medicine

## 2019-12-18 DIAGNOSIS — A4181 Sepsis due to Enterococcus: Secondary | ICD-10-CM | POA: Diagnosis not present

## 2019-12-18 DIAGNOSIS — I330 Acute and subacute infective endocarditis: Secondary | ICD-10-CM | POA: Diagnosis not present

## 2019-12-18 DIAGNOSIS — N390 Urinary tract infection, site not specified: Secondary | ICD-10-CM | POA: Diagnosis not present

## 2019-12-18 DIAGNOSIS — R4701 Aphasia: Secondary | ICD-10-CM | POA: Diagnosis not present

## 2019-12-18 DIAGNOSIS — R29810 Facial weakness: Secondary | ICD-10-CM | POA: Diagnosis not present

## 2019-12-18 DIAGNOSIS — G8191 Hemiplegia, unspecified affecting right dominant side: Secondary | ICD-10-CM | POA: Diagnosis not present

## 2019-12-18 NOTE — Telephone Encounter (Signed)
Deidre from [facility] called and said pt had a fall on Tuesday after leaving his appt here. Pt was at home he fell backwards and hit his shoulder and hip on the door and slide down. Pt was taken to have x rays and nothing was broken or fractured. Deidre can be reached at [phone]

## 2019-12-19 ENCOUNTER — Telehealth: Payer: Self-pay | Admitting: Family Medicine

## 2019-12-19 ENCOUNTER — Encounter (HOSPITAL_COMMUNITY): Payer: Self-pay | Admitting: Emergency Medicine

## 2019-12-19 ENCOUNTER — Emergency Department (HOSPITAL_COMMUNITY): Payer: Medicare Other

## 2019-12-19 ENCOUNTER — Inpatient Hospital Stay (HOSPITAL_COMMUNITY)
Admission: EM | Admit: 2019-12-19 | Discharge: 2019-12-24 | DRG: 535 | Disposition: A | Discharge status: Skilled Nursing Facility | Payer: Medicare Other | Attending: Internal Medicine | Admitting: Internal Medicine

## 2019-12-19 ENCOUNTER — Other Ambulatory Visit: Payer: Self-pay

## 2019-12-19 DIAGNOSIS — S32592A Other specified fracture of left pubis, initial encounter for closed fracture: Principal | ICD-10-CM | POA: Diagnosis present

## 2019-12-19 DIAGNOSIS — Z9079 Acquired absence of other genital organ(s): Secondary | ICD-10-CM

## 2019-12-19 DIAGNOSIS — Z86006 Personal history of melanoma in-situ: Secondary | ICD-10-CM

## 2019-12-19 DIAGNOSIS — Z923 Personal history of irradiation: Secondary | ICD-10-CM

## 2019-12-19 DIAGNOSIS — Z87891 Personal history of nicotine dependence: Secondary | ICD-10-CM

## 2019-12-19 DIAGNOSIS — L120 Bullous pemphigoid: Secondary | ICD-10-CM | POA: Diagnosis not present

## 2019-12-19 DIAGNOSIS — K219 Gastro-esophageal reflux disease without esophagitis: Secondary | ICD-10-CM | POA: Diagnosis present

## 2019-12-19 DIAGNOSIS — E785 Hyperlipidemia, unspecified: Secondary | ICD-10-CM | POA: Diagnosis present

## 2019-12-19 DIAGNOSIS — I959 Hypotension, unspecified: Secondary | ICD-10-CM | POA: Diagnosis not present

## 2019-12-19 DIAGNOSIS — I700 Atherosclerosis of aorta: Secondary | ICD-10-CM | POA: Diagnosis present

## 2019-12-19 DIAGNOSIS — Z888 Allergy status to other drugs, medicaments and biological substances status: Secondary | ICD-10-CM

## 2019-12-19 DIAGNOSIS — R918 Other nonspecific abnormal finding of lung field: Secondary | ICD-10-CM | POA: Diagnosis not present

## 2019-12-19 DIAGNOSIS — W1830XA Fall on same level, unspecified, initial encounter: Secondary | ICD-10-CM | POA: Diagnosis present

## 2019-12-19 DIAGNOSIS — S3289XA Fracture of other parts of pelvis, initial encounter for closed fracture: Secondary | ICD-10-CM

## 2019-12-19 DIAGNOSIS — R0902 Hypoxemia: Secondary | ICD-10-CM | POA: Diagnosis not present

## 2019-12-19 DIAGNOSIS — S329XXA Fracture of unspecified parts of lumbosacral spine and pelvis, initial encounter for closed fracture: Secondary | ICD-10-CM | POA: Diagnosis present

## 2019-12-19 DIAGNOSIS — Z792 Long term (current) use of antibiotics: Secondary | ICD-10-CM

## 2019-12-19 DIAGNOSIS — F0390 Unspecified dementia without behavioral disturbance: Secondary | ICD-10-CM | POA: Diagnosis present

## 2019-12-19 DIAGNOSIS — Z85830 Personal history of malignant neoplasm of bone: Secondary | ICD-10-CM

## 2019-12-19 DIAGNOSIS — Z952 Presence of prosthetic heart valve: Secondary | ICD-10-CM

## 2019-12-19 DIAGNOSIS — D693 Immune thrombocytopenic purpura: Secondary | ICD-10-CM | POA: Diagnosis not present

## 2019-12-19 DIAGNOSIS — R319 Hematuria, unspecified: Secondary | ICD-10-CM | POA: Diagnosis present

## 2019-12-19 DIAGNOSIS — J189 Pneumonia, unspecified organism: Secondary | ICD-10-CM | POA: Diagnosis not present

## 2019-12-19 DIAGNOSIS — Z66 Do not resuscitate: Secondary | ICD-10-CM | POA: Diagnosis not present

## 2019-12-19 DIAGNOSIS — M545 Low back pain: Secondary | ICD-10-CM | POA: Diagnosis not present

## 2019-12-19 DIAGNOSIS — Z974 Presence of external hearing-aid: Secondary | ICD-10-CM

## 2019-12-19 DIAGNOSIS — Z8673 Personal history of transient ischemic attack (TIA), and cerebral infarction without residual deficits: Secondary | ICD-10-CM

## 2019-12-19 DIAGNOSIS — E440 Moderate protein-calorie malnutrition: Secondary | ICD-10-CM | POA: Diagnosis present

## 2019-12-19 DIAGNOSIS — Z20822 Contact with and (suspected) exposure to covid-19: Secondary | ICD-10-CM | POA: Diagnosis present

## 2019-12-19 DIAGNOSIS — E876 Hypokalemia: Secondary | ICD-10-CM | POA: Diagnosis present

## 2019-12-19 DIAGNOSIS — I10 Essential (primary) hypertension: Secondary | ICD-10-CM | POA: Diagnosis present

## 2019-12-19 DIAGNOSIS — I2510 Atherosclerotic heart disease of native coronary artery without angina pectoris: Secondary | ICD-10-CM | POA: Diagnosis present

## 2019-12-19 DIAGNOSIS — Z7982 Long term (current) use of aspirin: Secondary | ICD-10-CM

## 2019-12-19 DIAGNOSIS — Z955 Presence of coronary angioplasty implant and graft: Secondary | ICD-10-CM

## 2019-12-19 DIAGNOSIS — Z79899 Other long term (current) drug therapy: Secondary | ICD-10-CM

## 2019-12-19 DIAGNOSIS — H8110 Benign paroxysmal vertigo, unspecified ear: Secondary | ICD-10-CM | POA: Diagnosis present

## 2019-12-19 DIAGNOSIS — R509 Fever, unspecified: Secondary | ICD-10-CM | POA: Diagnosis not present

## 2019-12-19 DIAGNOSIS — J690 Pneumonitis due to inhalation of food and vomit: Secondary | ICD-10-CM | POA: Diagnosis present

## 2019-12-19 DIAGNOSIS — F329 Major depressive disorder, single episode, unspecified: Secondary | ICD-10-CM | POA: Diagnosis present

## 2019-12-19 DIAGNOSIS — B001 Herpesviral vesicular dermatitis: Secondary | ICD-10-CM | POA: Diagnosis present

## 2019-12-19 DIAGNOSIS — Z79891 Long term (current) use of opiate analgesic: Secondary | ICD-10-CM

## 2019-12-19 DIAGNOSIS — R531 Weakness: Secondary | ICD-10-CM | POA: Diagnosis not present

## 2019-12-19 DIAGNOSIS — Z8546 Personal history of malignant neoplasm of prostate: Secondary | ICD-10-CM

## 2019-12-19 DIAGNOSIS — R29818 Other symptoms and signs involving the nervous system: Secondary | ICD-10-CM | POA: Diagnosis not present

## 2019-12-19 LAB — CBC WITH DIFFERENTIAL/PLATELET
Abs Immature Granulocytes: 0.04 10*3/uL (ref 0.00–0.07)
Basophils Absolute: 0 10*3/uL (ref 0.0–0.1)
Basophils Relative: 0 %
Eosinophils Absolute: 0 10*3/uL (ref 0.0–0.5)
Eosinophils Relative: 0 %
HCT: 32.1 % — ABNORMAL LOW (ref 39.0–52.0)
Hemoglobin: 11.3 g/dL — ABNORMAL LOW (ref 13.0–17.0)
Immature Granulocytes: 1 %
Lymphocytes Relative: 13 %
Lymphs Abs: 1 10*3/uL (ref 0.7–4.0)
MCH: 33.1 pg (ref 26.0–34.0)
MCHC: 35.2 g/dL (ref 30.0–36.0)
MCV: 94.1 fL (ref 80.0–100.0)
Monocytes Absolute: 0.5 10*3/uL (ref 0.1–1.0)
Monocytes Relative: 7 %
Neutro Abs: 6.1 10*3/uL (ref 1.7–7.7)
Neutrophils Relative %: 79 %
Platelets: DECREASED 10*3/uL (ref 150–400)
RBC: 3.41 MIL/uL — ABNORMAL LOW (ref 4.22–5.81)
RDW: 14 % (ref 11.5–15.5)
WBC: 7.7 10*3/uL (ref 4.0–10.5)
nRBC: 0 % (ref 0.0–0.2)

## 2019-12-19 LAB — COMPREHENSIVE METABOLIC PANEL
ALT: 10 U/L (ref 0–44)
AST: 17 U/L (ref 15–41)
Albumin: 2.8 g/dL — ABNORMAL LOW (ref 3.5–5.0)
Alkaline Phosphatase: 74 U/L (ref 38–126)
Anion gap: 12 (ref 5–15)
BUN: 12 mg/dL (ref 8–23)
CO2: 24 mmol/L (ref 22–32)
Calcium: 8.4 mg/dL — ABNORMAL LOW (ref 8.9–10.3)
Chloride: 95 mmol/L — ABNORMAL LOW (ref 98–111)
Creatinine, Ser: 0.75 mg/dL (ref 0.61–1.24)
GFR calc Af Amer: >60 mL/min (ref >60)
GFR calc non Af Amer: >60 mL/min (ref >60)
Glucose, Bld: 93 mg/dL (ref 70–99)
Potassium: 4 mmol/L (ref 3.5–5.1)
Sodium: 131 mmol/L — ABNORMAL LOW (ref 135–145)
Total Bilirubin: 0.8 mg/dL (ref 0.3–1.2)
Total Protein: 6.4 g/dL — ABNORMAL LOW (ref 6.5–8.1)

## 2019-12-19 LAB — RESPIRATORY PANEL BY RT PCR (FLU A&B, COVID)
Influenza A by PCR: NEGATIVE
Influenza B by PCR: NEGATIVE
SARS Coronavirus 2 by RT PCR: NEGATIVE

## 2019-12-19 LAB — TROPONIN I (HIGH SENSITIVITY)
Troponin I (High Sensitivity): 12 ng/L (ref <18)
Troponin I (High Sensitivity): 9 ng/L (ref <18)

## 2019-12-19 LAB — LACTIC ACID, PLASMA: Lactic Acid, Venous: 1.3 mmol/L (ref 0.5–1.9)

## 2019-12-19 MED ORDER — ASPIRIN EC 81 MG PO TBEC
81.0000 mg | DELAYED_RELEASE_TABLET | Freq: Every evening | ORAL | Status: DC
  Administered 2019-12-19 – 2019-12-23 (×5): 81 mg via ORAL
  Filled 2019-12-19 (×5): qty 1

## 2019-12-19 MED ORDER — SODIUM CHLORIDE 0.9 % IV SOLN
1.0000 g | Freq: Once | INTRAVENOUS | Status: AC
  Administered 2019-12-19: 1 g via INTRAVENOUS
  Filled 2019-12-19: qty 10

## 2019-12-19 MED ORDER — SODIUM CHLORIDE 0.9 % IV SOLN
500.0000 mg | Freq: Once | INTRAVENOUS | Status: AC
  Administered 2019-12-19: 500 mg via INTRAVENOUS
  Filled 2019-12-19: qty 500

## 2019-12-19 MED ORDER — BISACODYL 10 MG RE SUPP: 10.0000 mg | Freq: Every day | RECTAL | Status: DC | PRN

## 2019-12-19 MED ORDER — LAMOTRIGINE 100 MG PO TABS
50.0000 mg | ORAL_TABLET | Freq: Two times a day (BID) | ORAL | Status: DC
  Administered 2019-12-19 – 2019-12-24 (×10): 50 mg via ORAL
  Filled 2019-12-19 (×7): qty 1
  Filled 2019-12-19: qty 2
  Filled 2019-12-19 (×2): qty 1

## 2019-12-19 MED ORDER — POLYETHYLENE GLYCOL 3350 17 G PO PACK: 17.0000 g | PACK | Freq: Every day | ORAL | Status: DC | PRN

## 2019-12-19 MED ORDER — VITAMIN B-12 1000 MCG PO TABS
1000.0000 ug | ORAL_TABLET | Freq: Every day | ORAL | Status: DC
  Administered 2019-12-20 – 2019-12-24 (×5): 1000 ug via ORAL
  Filled 2019-12-19 (×5): qty 1

## 2019-12-19 MED ORDER — ACETAMINOPHEN 325 MG PO TABS
650.0000 mg | ORAL_TABLET | Freq: Four times a day (QID) | ORAL | Status: DC | PRN
  Administered 2019-12-21 – 2019-12-23 (×2): 650 mg via ORAL
  Filled 2019-12-19 (×2): qty 2

## 2019-12-19 MED ORDER — CARVEDILOL 3.125 MG PO TABS
3.1250 mg | ORAL_TABLET | Freq: Two times a day (BID) | ORAL | Status: DC
  Administered 2019-12-19 – 2019-12-24 (×10): 3.125 mg via ORAL
  Filled 2019-12-19 (×11): qty 1

## 2019-12-19 MED ORDER — ISOSORBIDE MONONITRATE ER 30 MG PO TB24
30.0000 mg | ORAL_TABLET | Freq: Every day | ORAL | Status: DC
  Administered 2019-12-20 – 2019-12-24 (×5): 30 mg via ORAL
  Filled 2019-12-19 (×5): qty 1

## 2019-12-19 MED ORDER — DULOXETINE HCL 60 MG PO CPEP
60.0000 mg | ORAL_CAPSULE | Freq: Every day | ORAL | Status: DC
  Administered 2019-12-20 – 2019-12-24 (×5): 60 mg via ORAL
  Filled 2019-12-19 (×5): qty 1

## 2019-12-19 MED ORDER — METHOTREXATE (ANTI-RHEUMATIC) 2.5 MG PO TABS: 10.0000 mg | ORAL_TABLET | ORAL | Status: DC

## 2019-12-19 MED ORDER — ROSUVASTATIN CALCIUM 5 MG PO TABS
10.0000 mg | ORAL_TABLET | Freq: Every day | ORAL | Status: DC
  Administered 2019-12-20 – 2019-12-24 (×5): 10 mg via ORAL
  Filled 2019-12-19 (×5): qty 2

## 2019-12-19 MED ORDER — DOXYCYCLINE HYCLATE 100 MG PO TABS
100.0000 mg | ORAL_TABLET | Freq: Two times a day (BID) | ORAL | Status: DC
  Administered 2019-12-19 – 2019-12-22 (×6): 100 mg via ORAL
  Filled 2019-12-19 (×6): qty 1

## 2019-12-19 MED ORDER — FERROUS SULFATE 325 (65 FE) MG PO TABS
325.0000 mg | ORAL_TABLET | Freq: Every day | ORAL | Status: DC
  Administered 2019-12-20 – 2019-12-24 (×5): 325 mg via ORAL
  Filled 2019-12-19 (×5): qty 1

## 2019-12-19 MED ORDER — TRAMADOL HCL 50 MG PO TABS
50.0000 mg | ORAL_TABLET | Freq: Four times a day (QID) | ORAL | Status: DC | PRN
  Administered 2019-12-24: 50 mg via ORAL
  Filled 2019-12-19 (×2): qty 1

## 2019-12-19 MED ORDER — ONDANSETRON HCL 4 MG PO TABS: 4.0000 mg | ORAL_TABLET | Freq: Four times a day (QID) | ORAL | Status: DC | PRN

## 2019-12-19 MED ORDER — ENOXAPARIN SODIUM 40 MG/0.4ML ~~LOC~~ SOLN
40.0000 mg | Freq: Every day | SUBCUTANEOUS | Status: DC
  Administered 2019-12-19 – 2019-12-23 (×5): 40 mg via SUBCUTANEOUS
  Filled 2019-12-19 (×5): qty 0.4

## 2019-12-19 MED ORDER — ONDANSETRON HCL 4 MG/2ML IJ SOLN: 4.0000 mg | Freq: Four times a day (QID) | INTRAMUSCULAR | Status: DC | PRN

## 2019-12-19 MED ORDER — SODIUM CHLORIDE 0.9 % IV BOLUS
1000.0000 mL | Freq: Once | INTRAVENOUS | Status: AC
  Administered 2019-12-19: 1000 mL via INTRAVENOUS

## 2019-12-19 MED ORDER — ENZALUTAMIDE 40 MG PO CAPS
160.0000 mg | ORAL_CAPSULE | Freq: Every day | ORAL | Status: DC
  Administered 2019-12-21 – 2019-12-23 (×2): 160 mg via ORAL
  Filled 2019-12-19 (×2): qty 4

## 2019-12-19 MED ORDER — ACETAMINOPHEN 650 MG RE SUPP: 650.0000 mg | Freq: Four times a day (QID) | RECTAL | Status: DC | PRN

## 2019-12-19 NOTE — Telephone Encounter (Signed)
Deidre from [facility] called and states that she was scheduled to see Tyvin today. She received a call from pt's daughter telling her not to come. Daughter stated to her that pt was headed to hospital via ambulance. Daughter stated that pt had blood in his urine and had not had anything by month since yesterday. [employer] rep Izola Price can be reached at [phone].

## 2019-12-19 NOTE — ED Triage Notes (Signed)
Pt BIB [facility] from home. Pt complaint of increased weakness following a fall on Tuesday. Pt evaluated Wednesday by PCP for fall. Family reports decreased appetite and decreased ambulatory ability.

## 2019-12-19 NOTE — ED Notes (Signed)
Pt 1st Lactic Acid normal. 2nd Lactic Acid to be discontinued.

## 2019-12-19 NOTE — ED Provider Notes (Signed)
[facility] EMERGENCY DEPARTMENT Provider Note   CSN: [id number] Arrival date & time: 12/19/19  1457     History Chief Complaint  Patient presents with  . Weakness    Corey Oneill is a 84 y.o. male.  HPI 84 year old male brought in via EMS for hematuria.  History is by patient and later over the phone with his daughter.  Patient apparently fell a couple days ago.  He states he thinks he slipped and injured his tailbone.  Since then he has significant pain when trying to walk.  He also feels like his legs are weak.  Normally he can walk with a walker but over the last couple days they are having to use a wheelchair.  No fevers.  He has some dementia but no new altered mental status.  No chest pain, shortness of breath or other pain.  At rest he is not really in pain.  Has some pain in his back when he is walking.  Family notes he has been having a decline over the last couple weeks.  Less p.o. intake.  Past Medical History:  Diagnosis Date  . Abdominal aortic atherosclerosis ([place])   . Anginal pain ([place])    last noted 08/17/19  . Bone cancer ([place])    Left hip  . BPPV (benign paroxysmal positional vertigo)   . Bullous pemphigoid   . CAD (coronary artery disease)    a.  LHC 8/16: Mid to distal LAD 30%, OM1 40%, proximal mid RCA 40%, distal RCA 60% >> FFR 0.69  >> PCI: 3 x 15 mm Resolute DES  . Depression   . Esophageal reflux   . Grade I diastolic dysfunction [id number]   Noted on ECHO   . H/O blood clots    "get them in my stool and urine" (11/12/2015)  . Heart murmur   . Hepatic lesion 12/08/2015   Stable 8 mm right hepatic lesion  . History of aortic stenosis    a. peak to peak gradient by LHC 8/16:  37 mmHg (moderately severe)   . History of appendicitis 2016  . History of blood transfusion 12/2018  . History of herpes labialis   . History of ITP    2018, thrombocytopenia  . History of kidney stones   . Hx of radiation therapy 04/14/13-06/09/13   prostate  7800 cGy, 40 sessions, seminal vesicles 5600 cGy 40 sessions  . Hydrocele 2006   Small  . Hyperlipidemia   . Insomnia    resolved  . LVH (left ventricular hypertrophy)    Moderate, noted on ECHO 09/2018  . 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 Metastasis from malignant neoplasm of prostate ([place]) 02/25/2019  . Prostate cancer ([place]) dx'd 2014  . Radiation proctitis   . Renal mass 2017   Bilateral renal masses  . S/P skin biopsy 04/09/2019   subepidermal cell poor vesicle , Linear IGG and C3 the basement membrane  . Suprapubic catheter ([place])   . Wears hearing aid in both ears     Patient Active Problem List   Diagnosis Date Noted  . Pelvic fracture ([place]) 12/19/2019  . Seizures ([place]) 11/26/2019  . Seizure ([place]) 11/26/2019  . Essential hypertension 11/25/2019  . Pressure injury of skin 11/15/2019  . TIA (transient ischemic attack) 11/15/2019  . Word finding difficulty 11/14/2019  . Transient alteration of awareness 09/25/2019  . Idiopathic thrombocytopenic purpura (ITP) ([facility]) 09/25/2019  . Macrocytic anemia 09/25/2019  .  Bacteremia due to Enterococcus 09/11/2019  . Bullous pemphigoid 06/12/2019  . Metastasis from malignant neoplasm of prostate ([place]) 02/25/2019  . Radiation proctitis   . Hx of radiation therapy   . Heart murmur   . H/O blood clots   . Family history of adverse reaction to anesthesia   . CAD (coronary artery disease)   . Aortic stenosis   . Anginal pain ([place])   . History of ITP 02/26/2017  . 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 S/P appendectomy 11/12/2015  . Esophageal reflux 03/22/2015  . Depression 11/20/2014  . Prostate cancer ([place]) 03/05/2013  . Erectile dysfunction   . Hyperlipidemia 06/07/2007    Past Surgical History:  Procedure Laterality Date  . CARDIAC CATHETERIZATION N/A 04/21/2015    Procedure: Right/Left Heart Cath and Coronary Angiography;  Surgeon: [name], MD;  Location: [facility];  Service: Cardiovascular;  Laterality: N/A;  . CARDIAC CATHETERIZATION N/A 06/18/2015   Procedure: Intravascular Pressure Wire/FFR Study;  Surgeon: [name], MD;  Location: [facility];  Service: Cardiovascular;  Laterality: N/A;  . CARDIAC CATHETERIZATION N/A 06/18/2015   Procedure: Coronary Stent Intervention;  Surgeon: [name], MD;  Location: [facility];  Service: Cardiovascular;  Laterality: N/A;  . CARDIAC CATHETERIZATION N/A 06/18/2015   Procedure: Right/Left Heart Cath and Coronary Angiography;  Surgeon: [name], MD;  Location: [facility];  Service: Cardiovascular;  Laterality: N/A;  . CARDIAC CATHETERIZATION N/A 06/23/2015   Procedure: Left Heart Cath and Cors/Grafts Angiography;  Surgeon: [name], MD;  Location: [facility];  Service: Cardiovascular;  Laterality: N/A;  . CARDIAC CATHETERIZATION N/A 01/17/2016   Procedure: Right/Left Heart Cath and Coronary Angiography;  Surgeon: [name], MD;  Location: [facility];  Service: Cardiovascular;  Laterality: N/A;  . CATARACT EXTRACTION, BILATERAL    . COLONOSCOPY  06/26/2017  . CYSTOSCOPY WITH BIOPSY N/A 07/30/2018   Procedure: CYSTOSCOPY WITH BIOPSY/FULGURATION, CYSTOLTHOLAPAXY;  Surgeon: [name], MD;  Location: [facility];  Service: Urology;  Laterality: N/A;  . CYSTOSCOPY WITH URETHRAL DILATATION N/A 08/18/2019   Procedure: CYSTOSCOPY WITH URETHRAL DILATATION USING BALLOON OR LASER/ SPURO PUBIC TUBE CHANGE LASER EXCISION OF URETHRAL STRICTURE RETROGRADE URETHROGRAM ANTEGRADE CYSTOGRAM;  Surgeon: [name], MD;  Location: [facility];  Service: Urology;  Laterality: N/A;  . FRACTURE SURGERY    . INGUINAL HERNIA REPAIR     patient does not remember this procedure  . LAPAROSCOPIC APPENDECTOMY N/A 11/16/2015   Procedure: APPENDECTOMY  LAPAROSCOPIC;  Surgeon: [name], MD;  Location: [facility];  Service: General;  Laterality: N/A;  . LEFT HEART CATH AND CORONARY ANGIOGRAPHY N/A 12/26/2016   Procedure: Left Heart Cath and Coronary Angiography;  Surgeon: Troy Sine, MD;  Location: [facility];  Service: Cardiovascular;  Laterality: N/A;  . MELANOMA EXCISION Right 10/24/2016   Procedure: WIDE EXCISION MELANOMA RIGHT NECK AND RIGHT CHEST TIMES 2;  Surgeon: [name], MD;  Location: [facility];  Service: General;  Laterality: Right;  right medial and lateral lesion of chest and right neck  . NASAL FRACTURE SURGERY     "broken years ago; several ORs to correct it"  . NASAL SEPTUM SURGERY    . PROSTATE BIOPSY  2014   "needle biopsy"  . TEE WITHOUT CARDIOVERSION N/A 02/15/2016   Procedure: TRANSESOPHAGEAL  ECHOCARDIOGRAM (TEE);  Surgeon: [name], MD;  Location: [facility];  Service: Open Heart Surgery;  Laterality: N/A;  . TEE WITHOUT CARDIOVERSION N/A 08/26/2019   Procedure: TRANSESOPHAGEAL ECHOCARDIOGRAM (TEE);  Surgeon: Pixie Casino, MD;  Location: [facility];  Service: Cardiovascular;  Laterality: N/A;  . TONSILLECTOMY    . TRANSCATHETER AORTIC VALVE REPLACEMENT, TRANSFEMORAL  02/15/2016  . TRANSCATHETER AORTIC VALVE REPLACEMENT, TRANSFEMORAL N/A 02/15/2016   Procedure: TRANSCATHETER AORTIC VALVE REPLACEMENT, TRANSFEMORAL;  Surgeon: [name], MD;  Location: [facility];  Service: Open Heart Surgery;  Laterality: N/A;  . TRANSURETHRAL RESECTION OF PROSTATE  12/23/2018   Procedure: CYSTOSCOPY WITH  BLADDER BIOPSY WITH FULGERATION/ TRANSURETHRAL RESECTION PROSTATE  AND PROSTATE BIOPSY;  Surgeon: [name], MD;  Location: [facility];  Service: Urology;;       Family History  Problem Relation Age of Onset  . Brain cancer Father   . Lymphoma Mother   . Depression Daughter     Social History   Tobacco Use  . Smoking status: Former Smoker    Packs/day: 0.00    Types: Cigarettes  .  Smokeless tobacco: Never Used  . Tobacco comment: "quit smoking cigarettes in the 1960s; quit smoking cigars in the 1990s  Substance Use Topics  . Alcohol use: Yes    Comment: 2 DRINKS PER DAY  . Drug use: No    Home Medications Prior to Admission medications   Medication Sig Start Date End Date Taking? Authorizing Provider  acetaminophen (TYLENOL) 500 MG tablet Take 500 mg by mouth every 6 (six) hours as needed for mild pain or moderate pain.    Yes [provider]  amoxicillin (AMOXIL) 500 MG tablet Take 1 tablet (500 mg total) by mouth 2 (two) times daily. 11/07/19  Yes Comer, Okey Regal, MD  aspirin EC 81 MG tablet Take 81 mg by mouth every evening.    Yes [provider]  Calcium-Phosphorus-Vitamin D (CITRACAL +D3 PO) Take 1 tablet by mouth at bedtime.   Yes [provider]  carvedilol (COREG) 3.125 MG tablet TAKE 1 TABLET BY MOUTH TWICE DAILY. Patient taking differently: Take 3.125 mg by mouth 2 (two) times daily.  06/26/19  Yes [name], MD  cyanocobalamin 1000 MCG tablet Take 1,000 mcg by mouth daily.   Yes [provider]  doxycycline (MONODOX) 100 MG capsule Take 100 mg by mouth 2 (two) times daily.  11/18/19  Yes [provider]  DULoxetine (CYMBALTA) 60 MG capsule Take 1 capsule (60 mg total) by mouth daily. 12/16/19  Yes [name], MD  enzalutamide Gillermina Phy) 40 MG capsule Take 160 mg by mouth daily with supper.    Yes [provider]  ferrous sulfate (FEROSUL) 325 (65 FE) MG tablet Take 325 mg by mouth daily with breakfast.   Yes [provider]  folic acid (FOLVITE) 1 MG tablet Take 1 mg by mouth See admin instructions. Take 1 mg by mouth once a day on Sun/Mon/Wed/Thurs/Fri/Sat (not on Tuesdays)   Yes [provider]  ibuprofen (ADVIL) 200 MG tablet Take 200-400 mg by mouth every 6 (six) hours as needed (for pain or inflammation).   Yes [provider]  isosorbide mononitrate (IMDUR) 30 MG 24  hr tablet Take 1 tablet (30 mg total) by mouth daily. 10/22/18 12/19/19 Yes Weaver, Lawernce Earll T, PA-C  lamoTRIgine (LAMICTAL) 25 MG tablet One tablet twice a day for 2 weeks, then take 2 tablets twice a day for 2 weeks, then take 3 tablets twice  a day Patient taking differently: Take 25-75 mg by mouth See admin instructions. Take 25 mg by mouth two times a day for 2 weeks, 50 mg two times a day for 2 weeks, then 75 mg two times a day 12/01/19  Yes Kathrynn Ducking, MD  MAGNESIUM PO Take 1 tablet by mouth daily.    Yes [provider]  methotrexate (RHEUMATREX) 2.5 MG tablet Take 10 mg by mouth every Tuesday.    Yes [provider]  MULTIPLE VITAMIN PO Take 1 tablet by mouth daily.   Yes [provider]  nitroGLYCERIN (NITROSTAT) 0.4 MG SL tablet 1 TAB UNDER TONGUE AS NEEDED FOR CHEST PAIN. MAY REPEAT EVERY 5 MIN FOR A TOTAL OF 3 DOSES. Patient taking differently: Place 0.4 mg under the tongue every 5 (five) minutes as needed for chest pain.  07/07/19  Yes [name], MD  rosuvastatin (CRESTOR) 10 MG tablet TAKE 1 TABLET ONCE DAILY. Patient taking differently: Take 10 mg by mouth at bedtime.  05/26/19  Yes Tysinger, Camelia Eng, PA-C  traMADol (ULTRAM) 50 MG tablet Take 25-50 mg by mouth 2 (two) times daily as needed (for pain).  12/17/19  Yes [provider]    Allergies    Prednisone  Review of Systems   Review of Systems  Constitutional: Positive for unexpected weight change. Negative for fever.  Respiratory: Negative for shortness of breath.   Cardiovascular: Negative for chest pain.  Gastrointestinal: Positive for nausea. Negative for abdominal pain, diarrhea and vomiting.  Musculoskeletal: Positive for back pain.  Neurological: Positive for weakness. Negative for headaches.  All other systems reviewed and are negative.   Physical Exam Updated Vital Signs BP (!) 106/46   Pulse 69   Temp 99.2 F (37.3 C) (Rectal)   Resp 11   SpO2 97%   Physical  Exam Vitals and nursing note reviewed.  Constitutional:      General: He is not in acute distress.    Appearance: He is well-developed. He is not ill-appearing or diaphoretic.  HENT:     Head: Normocephalic and atraumatic.     Right Ear: External ear normal.     Left Ear: External ear normal.     Nose: Nose normal.  Eyes:     General:        Right eye: No discharge.        Left eye: No discharge.     Extraocular Movements: Extraocular movements intact.     Pupils: Pupils are equal, round, and reactive to light.  Cardiovascular:     Rate and Rhythm: Normal rate and regular rhythm.     Heart sounds: Normal heart sounds.  Pulmonary:     Effort: Pulmonary effort is normal.     Breath sounds: Normal breath sounds.  Abdominal:     General: There is no distension.     Palpations: Abdomen is soft.     Tenderness: There is no abdominal tenderness.  Musculoskeletal:     Cervical back: Neck supple.  Skin:    General: Skin is warm and dry.  Neurological:     Mental Status: He is alert and oriented to person, place, and time.     Comments: CN 3-12 grossly intact. 5/5 strength in all 4 extremities. Grossly normal sensation. Normal finger to nose.   Psychiatric:        Mood and Affect: Mood is not anxious.     ED Results / Procedures / Treatments   Labs (all labs  ordered are listed, but only abnormal results are displayed) Labs Reviewed  COMPREHENSIVE METABOLIC PANEL - Abnormal; Notable for the following components:      Result Value   Sodium 131 (*)    Chloride 95 (*)    Calcium 8.4 (*)    Total Protein 6.4 (*)    Albumin 2.8 (*)    All other components within normal limits  CBC WITH DIFFERENTIAL/PLATELET - Abnormal; Notable for the following components:   RBC 3.41 (*)    Hemoglobin 11.3 (*)    HCT 32.1 (*)    All other components within normal limits  RESPIRATORY PANEL BY RT PCR (FLU A&B, COVID)  URINE CULTURE  CULTURE, BLOOD (ROUTINE X 2)  CULTURE, BLOOD (ROUTINE X 2)   LACTIC ACID, PLASMA  URINALYSIS, ROUTINE W REFLEX MICROSCOPIC  CBC  CREATININE, SERUM  BASIC METABOLIC PANEL  CBC WITH DIFFERENTIAL/PLATELET  IMMATURE PLATELET FRACTION  TROPONIN I (HIGH SENSITIVITY)  TROPONIN I (HIGH SENSITIVITY)    EKG EKG Interpretation  Date/Time:  Friday December 19 2019 15:06:42 EST Ventricular Rate:  80 PR Interval:    QRS Duration: 99 QT Interval:  413 QTC Calculation: 477 R Axis:   88 Text Interpretation: Sinus rhythm Atrial premature complex Right atrial enlargement Borderline right axis deviation Borderline prolonged QT interval Confirmed by Sherwood Gambler [phone]) on 12/19/2019 3:31:02 PM   Radiology CT Head Wo Contrast  Result Date: 12/19/2019 CLINICAL DATA:  Neuro deficits, subacute. EXAM: CT HEAD WITHOUT CONTRAST TECHNIQUE: Contiguous axial images were obtained from the base of the skull through the vertex without intravenous contrast. COMPARISON:  November 25, 2019 FINDINGS: Brain: There is moderate severity cerebral atrophy with widening of the extra-axial spaces and ventricular dilatation. There are areas of decreased attenuation within the white matter tracts of the supratentorial brain, consistent with microvascular disease changes. Vascular: No hyperdense vessel or unexpected calcification. Skull: Normal. Negative for fracture or focal lesion. Sinuses/Orbits: No acute finding. Other: None. IMPRESSION: 1. No acute intracranial pathology. 2. Brain atrophy and microvascular disease changes within the white matter tracts of the supratentorial brain. Electronically Signed   By: Virgina Norfolk M.D.   On: 12/19/2019 18:24   CT Chest Wo Contrast  Result Date: 12/19/2019 CLINICAL DATA:  Evaluate for pneumonia. EXAM: CT CHEST WITHOUT CONTRAST TECHNIQUE: Multidetector CT imaging of the chest was performed following the standard protocol without IV contrast. COMPARISON:  None. FINDINGS: Cardiovascular: There is mild to moderate severity calcification of the  aortic arch. An artificial aortic valve is seen. Normal heart size. No pericardial effusion. Marked severity coronary artery calcification is seen. Mediastinum/Nodes: A cluster of subcentimeter calcified lymph nodes is seen along the left hilum. Lungs/Pleura: Mild to moderate severity patchy infiltrate is seen within the posterolateral aspect of the right upper lobe and posterior aspect of the right lower lobe. Mild areas of bibasilar linear atelectasis are seen. There is no evidence of a pleural effusion or pneumothorax. Upper Abdomen: Noninflamed diverticula are seen along the splenic flexure. Musculoskeletal: Multilevel degenerative changes seen throughout the thoracic spine. IMPRESSION: Mild-to-moderate severity patchy right upper lobe and right lower lobe infiltrates. Aortic Atherosclerosis (ICD10-I70.0). Electronically Signed   By: Virgina Norfolk M.D.   On: 12/19/2019 18:56   CT Lumbar Spine Wo Contrast  Result Date: 12/19/2019 CLINICAL DATA:  Low back pain. EXAM: CT LUMBAR SPINE WITHOUT CONTRAST TECHNIQUE: Multidetector CT imaging of the lumbar spine was performed without intravenous contrast administration. Multiplanar CT image reconstructions were also generated. COMPARISON:  None. FINDINGS: Segmentation:  5 lumbar type vertebrae. Alignment: There is approximately 4 mm retrolisthesis of the L5 vertebral body on S1. Vertebrae: No acute fracture. A 6 mm sclerotic focus is seen within the lateral aspect of the L4 vertebral body on the left. Paraspinal and other soft tissues: There is marked severity calcification of the abdominal aorta. Disc levels: L1-2: Mild to moderate endplates sclerosis. Mild intervertebral disc space narrowing. Bilateral facet joint hypertrophy. Normal central canal and bilateral lateral recesses. Narrowing of the bilateral intervertebral neural foramina. L2-3: Mild-to-moderate endplates sclerosis. Mild intervertebral disc space narrowing. Bilateral facet joint hypertrophy. Normal  central canal and bilateral lateral recesses. Narrowing of the bilateral intervertebral neural foramina. L3-4: Mild to moderate endplates sclerosis. Mild intervertebral disc space narrowing. Bilateral facet joint hypertrophy. Normal central canal and bilateral lateral recesses. Narrowing of the bilateral intervertebral neural foramina. L4-5: Mild-to-moderate endplates sclerosis. Mild intervertebral disc space narrowing. Bilateral facet joint hypertrophy. Normal central canal and bilateral lateral recesses. Narrowing of the bilateral intervertebral neural foramina. L5-S1: Moderate-severity endplates sclerosis. Marked severity intervertebral disc space narrowing. Bilateral facet joint hypertrophy. Normal central canal and bilateral lateral recesses. Narrowing of the bilateral intervertebral neural foramina. IMPRESSION: 1. No acute osseous abnormality. 2. Multilevel degenerative changes, most prominent at the level of L5-S1. 3. Approximately 4 mm retrolisthesis of the L5 vertebral body on S1. Electronically Signed   By: Virgina Norfolk M.D.   On: 12/19/2019 18:40   CT PELVIS WO CONTRAST  Result Date: 12/19/2019 CLINICAL DATA:  84 year old male with pelvic trauma. EXAM: CT PELVIS WITHOUT CONTRAST TECHNIQUE: Multidetector CT imaging of the pelvis was performed following the standard protocol without intravenous contrast. COMPARISON:  CT of the abdomen pelvis dated 01/25/2016. FINDINGS: Evaluation of this exam is limited in the absence of intravenous contrast. Evaluation is also limited due to respiratory motion artifact. Urinary Tract: The visualized ureters and urinary bladder appear unremarkable. Bowel:  Unremarkable visualized pelvic bowel loops. Vascular/Lymphatic: Advanced aortoiliac atherosclerotic disease. Reproductive:  Prostate fiducial markers noted. Other:  None Musculoskeletal: There is a nondisplaced fracture of the left inferior pubic ramus. Linear cortical lucency in the left ischial tuberosity (axial  series 3, image 108 and coronal series 6 images 68 and 72) may represent vascular groove and less likely a nondisplaced fracture. No other acute fracture identified. The bones are osteopenic. There is no dislocation. There is a patchy area of sclerotic changes in the superior left acetabulum as well as a subcentimeter sclerotic focus in the posterior right iliac bone most concerning for metastatic disease, likely from prostate cancer. Clinical correlation is recommended. Further evaluation with MRI or bone scan recommended. IMPRESSION: 1. Nondisplaced fracture of the left inferior pubic ramus. 2. Sclerotic lesion in the superior aspect of the left acetabulum and right iliac bone new since the prior CT and most concerning for metastatic disease. Further evaluation with MRI or bone scan recommended. Electronically Signed   By: Anner Crete M.D.   On: 12/19/2019 18:55   DG Chest Portable 1 View  Result Date: 12/19/2019 CLINICAL DATA:  84 year old male with weakness. EXAM: PORTABLE CHEST 1 VIEW COMPARISON:  Chest radiograph dated 11/14/2019. FINDINGS: Right upper lobe and lingular densities which although may represent atelectasis are concerning for developing infiltrate. Clinical correlation and follow-up to resolution recommended. There is no pleural effusion or pneumothorax. The cardiac silhouette is within normal limits. Coronary vascular calcification and aortic valve repair noted. Atherosclerotic calcification of the aorta. No acute osseous pathology. IMPRESSION: Findings concerning for right upper lobe and lingular pneumonia. Clinical correlation  and follow-up recommended. Electronically Signed   By: Anner Crete M.D.   On: 12/19/2019 17:16    Procedures Procedures (including critical care time)  Medications Ordered in ED Medications  aspirin EC tablet 81 mg (81 mg Oral Given 12/19/19 2316)  methotrexate (RHEUMATREX) tablet 10 mg (has no administration in time range)  doxycycline (VIBRA-TABS)  tablet 100 mg (100 mg Oral Given 12/19/19 2316)  enzalutamide (XTANDI) capsule 160 mg (has no administration in time range)  carvedilol (COREG) tablet 3.125 mg (3.125 mg Oral Given 12/19/19 2317)  isosorbide mononitrate (IMDUR) 24 hr tablet 30 mg (has no administration in time range)  rosuvastatin (CRESTOR) tablet 10 mg (has no administration in time range)  DULoxetine (CYMBALTA) DR capsule 60 mg (has no administration in time range)  vitamin B-12 (CYANOCOBALAMIN) tablet 1,000 mcg (has no administration in time range)  ferrous sulfate tablet 325 mg (has no administration in time range)  lamoTRIgine (LAMICTAL) tablet 50 mg (50 mg Oral Given 12/19/19 2317)  enoxaparin (LOVENOX) injection 40 mg (40 mg Subcutaneous Given 12/19/19 2317)  acetaminophen (TYLENOL) tablet 650 mg (has no administration in time range)    Or  acetaminophen (TYLENOL) suppository 650 mg (has no administration in time range)  traMADol (ULTRAM) tablet 50 mg (has no administration in time range)  polyethylene glycol (MIRALAX / GLYCOLAX) packet 17 g (has no administration in time range)  bisacodyl (DULCOLAX) suppository 10 mg (has no administration in time range)  ondansetron (ZOFRAN) tablet 4 mg (has no administration in time range)    Or  ondansetron (ZOFRAN) injection 4 mg (has no administration in time range)  sodium chloride 0.9 % bolus 1,000 mL (0 mLs Intravenous Stopped 12/19/19 2212)  cefTRIAXone (ROCEPHIN) 1 g in sodium chloride 0.9 % 100 mL IVPB (0 g Intravenous Stopped 12/19/19 2039)  azithromycin (ZITHROMAX) 500 mg in sodium chloride 0.9 % 250 mL IVPB (0 mg Intravenous Stopped 12/19/19 2039)    ED Course  I have reviewed the triage vital signs and the nursing notes.  Pertinent labs & imaging results that were available during my care of the patient were reviewed by me and considered in my medical decision making (see chart for details).    MDM Rules/Calculators/A&P                      Patient found to have 2 different  issues.  He has pneumonia on the right side which is likely contributing to his weakness and poor p.o. intake.  While he is not having sepsis, the generalized weakness is concerning and I think you will need admission for antibiotics and rehabilitation.  His difficulty walking appears to be due to a pelvic fracture seen on CT.  This is likely why he cannot bear weight.  Also will need PT for this.  Discussed with hospitalist for admission.  Corey Oneill was evaluated in Emergency Department on 12/20/2019 for the symptoms described in the history of present illness. He was evaluated in the context of the global COVID-19 pandemic, which necessitated consideration that the patient might be at risk for infection with the SARS-CoV-2 virus that causes COVID-19. Institutional protocols and algorithms that pertain to the evaluation of patients at risk for COVID-19 are in a state of rapid change based on information released by regulatory bodies including the CDC and federal and state organizations. These policies and algorithms were followed during the patient's care in the ED.  Final Clinical Impression(s) / ED Diagnoses Final diagnoses:  Community acquired pneumonia of right upper lobe of lung  Closed fracture of ramus of left pubis, initial encounter [facility])    Rx / DC Orders ED Discharge Orders    None       Sherwood Gambler, MD 12/20/19 0006

## 2019-12-19 NOTE — Progress Notes (Signed)
Ortho Trauma Note  I was called to review imaging of CT pelvis. 84 yo male with left inferior pubic ramus fracture. No significant posterior pelvic ring injury. Patient may be WBAT to LLE. Patient can follow up with me on an outpatient basis in 3-4 weeks for repeat x-rays.  Shona Needles, MD [facility] [phone] (office) [email or web address]

## 2019-12-19 NOTE — ED Notes (Signed)
Please call daughter with an update her name is [id number]

## 2019-12-19 NOTE — H&P (Signed)
History and Physical    FEYNMAN FARO [id number] DOB: Nov 30, 1935 DOA: 12/19/2019  PCP: [name], MD   Patient coming from: Home  I have personally briefly reviewed patient's old medical records in [place]  Chief Complaint: Hematuria, decreased p.o. intake  HPI: ADREW DUNST is a 84 y.o. 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  This was described as a mechanical fall, with him slipping on slippery floor at home, and injuring his tailbone.  He typically uses a walker to walk, had increasing difficulty and has been using a wheelchair for the last day.  He was evaluated by his PCP on 2/3, and found to be doing fairly well, though unfortunately in the intervening time he has had decreased p.o. intake and progressive weakness, culminating with an episode of frank hematuria today, prompting his daughter call EMS, to take him to the emergency department.  Reports associated back, leg and tailbone pain. Denies chest pain, dyspnea. Of note, patient had recent hospitalization 11/25/2019 to 11/28/2019 with a likely TIA after presenting with aphasia.   ED Course: On arrival to the ED pt was afebrile, heart rate 73, respiratory 9, blood pressure 129/62, saturating 90% on room air. Labs and imaging studies acquired. Initial labwork notable for hemoglobin 11.3, WBC 7.7, sodium 131, creatinine 0.75, lactate 1.3. Imaging studies notable for chest x-ray concerning for right upper lobe pneumonia.  CT chest with evidence of mild to moderate severity patchy right upper lobe and right lower lobe infiltrates. CT head without acute intracranial pathology.   CT lumbar spine with no acute osseous  abnormalities, multilevel degenerative changes noted.  CT pelvis noted to have a nondisplaced fracture of the left inferior pubic ramus.  Sclerotic lesion in the superior left acetabulum new since prior CT most concerning for metastatic disease. Patient was administered azithromycin, Rocephin, 1 L normal saline. Given concern for pelvic fracture and pneumonia, decision was made to admit.   Review of Systems: As per HPI otherwise 10 point review of systems negative.   Past Medical History:  Diagnosis Date  . Abdominal aortic atherosclerosis ([place])   . Anginal pain ([place])    last noted 08/17/19  . Bone cancer ([place])    Left hip  . BPPV (benign paroxysmal positional vertigo)   . Bullous pemphigoid   . CAD (coronary artery disease)    a.  LHC 8/16: Mid to distal LAD 30%, OM1 40%, proximal mid RCA 40%, distal RCA 60% >> FFR 0.69  >> PCI: 3 x 15 mm Resolute DES  . Depression   . Esophageal reflux   . Grade I diastolic dysfunction [id number]   Noted on ECHO   . H/O blood clots    "get them in my stool and urine" (11/12/2015)  . Heart murmur   . Hepatic lesion 12/08/2015   Stable 8 mm right hepatic lesion  . History of aortic stenosis    a. peak to peak gradient by LHC 8/16:  37 mmHg (moderately severe)   . History of appendicitis 2016  . History of blood transfusion 12/2018  . History of herpes labialis   . History of ITP    2018, thrombocytopenia  . History of kidney stones   . Hx of radiation therapy 04/14/13-06/09/13   prostate  7800 cGy, 40 sessions, seminal vesicles 5600 cGy 40 sessions  . Hydrocele 2006   Small  . Hyperlipidemia   . Insomnia    resolved  . LVH (left ventricular hypertrophy)    Moderate, noted on ECHO 09/2018  . Malignant melanoma in situ ([place]) 09/04/2016   Right neck and chest  . Metastasis from malignant neoplasm of prostate ([place]) 02/25/2019  . Prostate cancer ([place]) dx'd 2014  . Radiation proctitis   . Renal mass 2017   Bilateral renal masses  . S/P skin  biopsy 04/09/2019   subepidermal cell poor vesicle , Linear IGG and C3 the basement membrane  . Suprapubic catheter ([place])   . Wears hearing aid in both ears     Past Surgical History:  Procedure Laterality Date  . CARDIAC CATHETERIZATION N/A 04/21/2015   Procedure: Right/Left Heart Cath and Coronary Angiography;  Surgeon: [name], MD;  Location: [facility];  Service: Cardiovascular;  Laterality: N/A;  . CARDIAC CATHETERIZATION N/A 06/18/2015   Procedure: Intravascular Pressure Wire/FFR Study;  Surgeon: [name], MD;  Location: [facility];  Service: Cardiovascular;  Laterality: N/A;  . CARDIAC CATHETERIZATION N/A 06/18/2015   Procedure: Coronary Stent Intervention;  Surgeon: [name], MD;  Location: [facility];  Service: Cardiovascular;  Laterality: N/A;  . CARDIAC CATHETERIZATION N/A 06/18/2015   Procedure: Right/Left Heart Cath and Coronary Angiography;  Surgeon: [name], MD;  Location: [facility];  Service: Cardiovascular;  Laterality: N/A;  . CARDIAC CATHETERIZATION N/A 06/23/2015   Procedure: Left Heart Cath and Cors/Grafts Angiography;  Surgeon: [name], MD;  Location: [facility];  Service: Cardiovascular;  Laterality: N/A;  . CARDIAC CATHETERIZATION N/A 01/17/2016   Procedure: Right/Left Heart Cath and Coronary Angiography;  Surgeon: [name], MD;  Location: [facility];  Service: Cardiovascular;  Laterality: N/A;  . CATARACT EXTRACTION, BILATERAL    . COLONOSCOPY  06/26/2017  . CYSTOSCOPY WITH BIOPSY N/A 07/30/2018   Procedure: CYSTOSCOPY WITH BIOPSY/FULGURATION, CYSTOLTHOLAPAXY;  Surgeon: [name], MD;  Location[facility];  Service: Urology;  Laterality: N/A;  . CYSTOSCOPY WITH URETHRAL DILATATION N/A 08/18/2019   Procedure: CYSTOSCOPY WITH URETHRAL DILATATION USING BALLOON OR LASER/ SPURO PUBIC TUBE CHANGE LASER EXCISION OF URETHRAL STRICTURE RETROGRADE URETHROGRAM ANTEGRADE CYSTOGRAM;   Surgeon: [name], MD;  Location: [facility];  Service: Urology;  Laterality: N/A;  . FRACTURE SURGERY    . INGUINAL HERNIA REPAIR     patient does not remember this procedure  . LAPAROSCOPIC APPENDECTOMY N/A 11/16/2015   Procedure: APPENDECTOMY LAPAROSCOPIC;  Surgeon: [name], MD;  Location: [facility];  Service: General;  Laterality: N/A;  . LEFT HEART CATH AND CORONARY ANGIOGRAPHY N/A 12/26/2016   Procedure: Left Heart Cath and Coronary Angiography;  Surgeon: Troy Sine, MD;  Location: [facility];  Service: Cardiovascular;  Laterality: N/A;  . MELANOMA EXCISION Right 10/24/2016   Procedure: WIDE EXCISION MELANOMA RIGHT NECK AND RIGHT CHEST TIMES 2;  Surgeon: [name], MD;  Location: [facility];  Service: General;  Laterality: Right;  right medial and lateral lesion of chest and right neck  . NASAL FRACTURE SURGERY     "broken years ago; several ORs to correct it"  . NASAL SEPTUM SURGERY    . PROSTATE BIOPSY  2014   "needle biopsy"  . TEE WITHOUT CARDIOVERSION N/A 02/15/2016   Procedure: TRANSESOPHAGEAL ECHOCARDIOGRAM (TEE);  Surgeon: [name], MD;  Location: [facility];  Service: Open Heart Surgery;  Laterality: N/A;  . TEE WITHOUT CARDIOVERSION N/A 08/26/2019   Procedure: TRANSESOPHAGEAL ECHOCARDIOGRAM (TEE);  Surgeon: Pixie Casino, MD;  Location[facility];  Service: Cardiovascular;  Laterality: N/A;  . TONSILLECTOMY    . TRANSCATHETER AORTIC VALVE REPLACEMENT, TRANSFEMORAL  02/15/2016  . TRANSCATHETER AORTIC VALVE REPLACEMENT, TRANSFEMORAL N/A 02/15/2016   Procedure: TRANSCATHETER AORTIC VALVE REPLACEMENT, TRANSFEMORAL;  Surgeon: [name], MD;  Location: [facility];  Service: Open Heart Surgery;  Laterality: N/A;  . TRANSURETHRAL RESECTION OF PROSTATE  12/23/2018   Procedure: CYSTOSCOPY WITH  BLADDER BIOPSY WITH FULGERATION/ TRANSURETHRAL RESECTION PROSTATE  AND PROSTATE BIOPSY;  Surgeon: [name], MD;  Location: [facility];  Service:  Urology;;     reports that he has quit smoking. His smoking use included cigarettes. He smoked 0.00 packs per day. He has never used smokeless tobacco. He reports current alcohol use. He reports that he does not use drugs.  Allergies  Allergen Reactions  . Prednisone Other (See Comments)    Makes the patient depressed    Family History  Problem Relation Age of Onset  . Brain cancer Father   . Lymphoma Mother   . Depression Daughter     Prior to Admission medications   Medication Sig Start Date End Date Taking? Authorizing Provider  acetaminophen (TYLENOL) 500 MG tablet Take 500 mg by mouth every 6 (six) hours as needed for moderate pain.     [provider]  amoxicillin (AMOXIL) 500 MG tablet Take 1 tablet (500 mg total) by mouth 2 (two) times daily. 11/07/19   Thayer Headings, MD  aspirin EC 81 MG tablet Take 81 mg by mouth every evening.     [provider]  Calcium-Phosphorus-Vitamin D (CITRACAL +D3 PO) Take 1 tablet by mouth at bedtime.    [provider]  carvedilol (COREG) 3.125 MG tablet TAKE 1 TABLET BY MOUTH TWICE DAILY. Patient taking differently: Take 3.125 mg by mouth 2 (two) times daily.  06/26/19   [name], MD  cyanocobalamin 1000 MCG tablet Take 1,000 mcg by mouth daily.    [provider]  doxycycline (MONODOX) 100 MG capsule Take 100 mg by mouth 2 (two) times daily.  11/18/19   [provider]  DULoxetine (CYMBALTA) 60 MG capsule Take 1 capsule (60 mg total) by mouth daily. 12/16/19   [name], MD  enzalutamide Gillermina Phy) 40 MG capsule Take 160 mg by mouth daily with supper.     [provider]  ferrous sulfate (FEROSUL) 325 (65 FE) MG tablet Take 325 mg by mouth daily with breakfast.    [provider]  folic acid (FOLVITE) 1 MG tablet Take 1 mg by mouth See admin instructions. Take 1 mg by mouth once a day on Sun/Mon/Wed/Thurs/Fri/Sat (not on Tuesdays)    [provider]  isosorbide  mononitrate (IMDUR) 30 MG 24 hr tablet Take 1 tablet (30 mg total) by mouth daily. 10/22/18 12/16/19  Richardson Dopp T, PA-C  lamoTRIgine (LAMICTAL) 25 MG tablet One tablet twice a day for 2 weeks, then take 2 tablets twice a day for 2 weeks, then take 3 tablets twice a day 12/01/19   Kathrynn Ducking, MD  MAGNESIUM PO Take by mouth.    [provider]  methotrexate (RHEUMATREX) 2.5 MG tablet Take 10 mg by mouth every Tuesday.     [provider]  MULTIPLE VITAMIN PO Take 1 tablet by mouth daily.  [provider]  nitroGLYCERIN (NITROSTAT) 0.4 MG SL tablet 1 TAB UNDER TONGUE AS NEEDED FOR CHEST PAIN. MAY REPEAT EVERY 5 MIN FOR A TOTAL OF 3 DOSES. Patient taking differently: Place 0.4 mg under the tongue every 5 (five) minutes as needed for chest pain.  07/07/19   [name], MD  rosuvastatin (CRESTOR) 10 MG tablet TAKE 1 TABLET ONCE DAILY. Patient taking differently: Take 10 mg by mouth daily.  05/26/19   Carlena Hurl, PA-C    Physical Exam: Vitals:   12/19/19 1630 12/19/19 1639 12/19/19 1700 12/19/19 2030  BP: (!) 165/64  117/62 129/62  Pulse: 78  84 73  Resp: 11  18 (!) 9  Temp:  99.2 F (37.3 C)    TempSrc:  Rectal    SpO2: 99%  (!) 88% 98%   Constitutional: NAD, calm, comfortable Eyes: PERRL, lids and conjunctivae normal ENMT: Mucous membranes are moist. Posterior pharynx clear of any exudate or lesions. Neck: normal, supple, no masses, no thyromegaly Respiratory: Coarse right-sided rhonchi, faint bibasilar rales. Normal respiratory effort. No accessory muscle use.  Cardiovascular: Regular rate and rhythm, no murmurs / rubs / gallops. No extremity edema. 2+ pedal pulses. No carotid bruits.  Abdomen: no tenderness, no masses palpated. No hepatosplenomegaly. Bowel sounds positive.  Musculoskeletal: no clubbing / cyanosis. No joint deformity upper and lower extremities.  Range of motion limited due to pain on the left leg.  Normal muscle tone.  Skin:  no rashes, lesions, ulcers. No induration Neurologic: CN 2-12 grossly intact. Sensation intact. Strength 5/5 in all 4.  Psychiatric: Normal judgment and insight. Alert and oriented x 3. Normal mood.   Labs on Admission: I have personally reviewed following labs and imaging studies  CBC: Recent Labs  Lab 12/16/19 1341 12/19/19 1601  WBC 8.6 7.7  NEUTROABS 7.1* 6.1  HGB 11.1* 11.3*  HCT 32.0* 32.1*  MCV 94 94.1  PLT 69* PLATELET CLUMPS NOTED ON SMEAR, COUNT APPEARS DECREASED   Basic Metabolic Panel: Recent Labs  Lab 12/16/19 1341 12/19/19 1601  NA 135 131*  K 4.2 4.0  CL 96 95*  CO2 25 24  GLUCOSE 90 93  BUN 18 12  CREATININE 0.72* 0.75  CALCIUM 8.7 8.4*   GFR: CrCl cannot be calculated (Unknown ideal weight.). Liver Function Tests: Recent Labs  Lab 12/16/19 1341 12/19/19 1601  AST 13 17  ALT 6 10  ALKPHOS 96 74  BILITOT 0.4 0.8  PROT 6.0 6.4*  ALBUMIN 3.6 2.8*   No results for input(s): LIPASE, AMYLASE in the last 168 hours. No results for input(s): AMMONIA in the last 168 hours. Coagulation Profile: No results for input(s): INR, PROTIME in the last 168 hours. Cardiac Enzymes: No results for input(s): CKTOTAL, CKMB, CKMBINDEX, TROPONINI in the last 168 hours. BNP (last 3 results) No results for input(s): PROBNP in the last 8760 hours. HbA1C: No results for input(s): HGBA1C in the last 72 hours. CBG: No results for input(s): GLUCAP in the last 168 hours. Lipid Profile: No results for input(s): CHOL, HDL, LDLCALC, TRIG, CHOLHDL, LDLDIRECT in the last 72 hours. Thyroid Function Tests: No results for input(s): TSH, T4TOTAL, FREET4, T3FREE, THYROIDAB in the last 72 hours. Anemia Panel: No results for input(s): VITAMINB12, FOLATE, FERRITIN, TIBC, IRON, RETICCTPCT in the last 72 hours. Urine analysis:    Component Value Date/Time   COLORURINE YELLOW 11/15/2019 0930   APPEARANCEUR HAZY (A) 11/15/2019 0930   LABSPEC 1.028 11/15/2019 0930   PHURINE 7.0  11/15/2019 0930  GLUCOSEU NEGATIVE 11/15/2019 0930   HGBUR LARGE (A) 11/15/2019 0930   BILIRUBINUR NEGATIVE 11/15/2019 0930   BILIRUBINUR n 10/15/2015 0939   KETONESUR NEGATIVE 11/15/2019 0930   PROTEINUR NEGATIVE 11/15/2019 0930   UROBILINOGEN 0.2 10/15/2015 0939   UROBILINOGEN 0.2 06/23/2015 1443   NITRITE NEGATIVE 11/15/2019 0930   LEUKOCYTESUR NEGATIVE 11/15/2019 0930    Radiological Exams on Admission: CT Head Wo Contrast  Result Date: 12/19/2019 CLINICAL DATA:  Neuro deficits, subacute. EXAM: CT HEAD WITHOUT CONTRAST TECHNIQUE: Contiguous axial images were obtained from the base of the skull through the vertex without intravenous contrast. COMPARISON:  November 25, 2019 FINDINGS: Brain: There is moderate severity cerebral atrophy with widening of the extra-axial spaces and ventricular dilatation. There are areas of decreased attenuation within the white matter tracts of the supratentorial brain, consistent with microvascular disease changes. Vascular: No hyperdense vessel or unexpected calcification. Skull: Normal. Negative for fracture or focal lesion. Sinuses/Orbits: No acute finding. Other: None. IMPRESSION: 1. No acute intracranial pathology. 2. Brain atrophy and microvascular disease changes within the white matter tracts of the supratentorial brain. Electronically Signed   By: Virgina Norfolk M.D.   On: 12/19/2019 18:24   CT Chest Wo Contrast  Result Date: 12/19/2019 CLINICAL DATA:  Evaluate for pneumonia. EXAM: CT CHEST WITHOUT CONTRAST TECHNIQUE: Multidetector CT imaging of the chest was performed following the standard protocol without IV contrast. COMPARISON:  None. FINDINGS: Cardiovascular: There is mild to moderate severity calcification of the aortic arch. An artificial aortic valve is seen. Normal heart size. No pericardial effusion. Marked severity coronary artery calcification is seen. Mediastinum/Nodes: A cluster of subcentimeter calcified lymph nodes is seen along the left  hilum. Lungs/Pleura: Mild to moderate severity patchy infiltrate is seen within the posterolateral aspect of the right upper lobe and posterior aspect of the right lower lobe. Mild areas of bibasilar linear atelectasis are seen. There is no evidence of a pleural effusion or pneumothorax. Upper Abdomen: Noninflamed diverticula are seen along the splenic flexure. Musculoskeletal: Multilevel degenerative changes seen throughout the thoracic spine. IMPRESSION: Mild-to-moderate severity patchy right upper lobe and right lower lobe infiltrates. Aortic Atherosclerosis (ICD10-I70.0). Electronically Signed   By: Virgina Norfolk M.D.   On: 12/19/2019 18:56   CT Lumbar Spine Wo Contrast  Result Date: 12/19/2019 CLINICAL DATA:  Low back pain. EXAM: CT LUMBAR SPINE WITHOUT CONTRAST TECHNIQUE: Multidetector CT imaging of the lumbar spine was performed without intravenous contrast administration. Multiplanar CT image reconstructions were also generated. COMPARISON:  None. FINDINGS: Segmentation: 5 lumbar type vertebrae. Alignment: There is approximately 4 mm retrolisthesis of the L5 vertebral body on S1. Vertebrae: No acute fracture. A 6 mm sclerotic focus is seen within the lateral aspect of the L4 vertebral body on the left. Paraspinal and other soft tissues: There is marked severity calcification of the abdominal aorta. Disc levels: L1-2: Mild to moderate endplates sclerosis. Mild intervertebral disc space narrowing. Bilateral facet joint hypertrophy. Normal central canal and bilateral lateral recesses. Narrowing of the bilateral intervertebral neural foramina. L2-3: Mild-to-moderate endplates sclerosis. Mild intervertebral disc space narrowing. Bilateral facet joint hypertrophy. Normal central canal and bilateral lateral recesses. Narrowing of the bilateral intervertebral neural foramina. L3-4: Mild to moderate endplates sclerosis. Mild intervertebral disc space narrowing. Bilateral facet joint hypertrophy. Normal central  canal and bilateral lateral recesses. Narrowing of the bilateral intervertebral neural foramina. L4-5: Mild-to-moderate endplates sclerosis. Mild intervertebral disc space narrowing. Bilateral facet joint hypertrophy. Normal central canal and bilateral lateral recesses. Narrowing of the bilateral intervertebral neural foramina.  L5-S1: Moderate-severity endplates sclerosis. Marked severity intervertebral disc space narrowing. Bilateral facet joint hypertrophy. Normal central canal and bilateral lateral recesses. Narrowing of the bilateral intervertebral neural foramina. IMPRESSION: 1. No acute osseous abnormality. 2. Multilevel degenerative changes, most prominent at the level of L5-S1. 3. Approximately 4 mm retrolisthesis of the L5 vertebral body on S1. Electronically Signed   By: Virgina Norfolk M.D.   On: 12/19/2019 18:40   CT PELVIS WO CONTRAST  Result Date: 12/19/2019 CLINICAL DATA:  84 year old male with pelvic trauma. EXAM: CT PELVIS WITHOUT CONTRAST TECHNIQUE: Multidetector CT imaging of the pelvis was performed following the standard protocol without intravenous contrast. COMPARISON:  CT of the abdomen pelvis dated 01/25/2016. FINDINGS: Evaluation of this exam is limited in the absence of intravenous contrast. Evaluation is also limited due to respiratory motion artifact. Urinary Tract: The visualized ureters and urinary bladder appear unremarkable. Bowel:  Unremarkable visualized pelvic bowel loops. Vascular/Lymphatic: Advanced aortoiliac atherosclerotic disease. Reproductive:  Prostate fiducial markers noted. Other:  None Musculoskeletal: There is a nondisplaced fracture of the left inferior pubic ramus. Linear cortical lucency in the left ischial tuberosity (axial series 3, image 108 and coronal series 6 images 68 and 72) may represent vascular groove and less likely a nondisplaced fracture. No other acute fracture identified. The bones are osteopenic. There is no dislocation. There is a patchy area  of sclerotic changes in the superior left acetabulum as well as a subcentimeter sclerotic focus in the posterior right iliac bone most concerning for metastatic disease, likely from prostate cancer. Clinical correlation is recommended. Further evaluation with MRI or bone scan recommended. IMPRESSION: 1. Nondisplaced fracture of the left inferior pubic ramus. 2. Sclerotic lesion in the superior aspect of the left acetabulum and right iliac bone new since the prior CT and most concerning for metastatic disease. Further evaluation with MRI or bone scan recommended. Electronically Signed   By: Anner Crete M.D.   On: 12/19/2019 18:55   DG Chest Portable 1 View  Result Date: 12/19/2019 CLINICAL DATA:  84 year old male with weakness. EXAM: PORTABLE CHEST 1 VIEW COMPARISON:  Chest radiograph dated 11/14/2019. FINDINGS: Right upper lobe and lingular densities which although may represent atelectasis are concerning for developing infiltrate. Clinical correlation and follow-up to resolution recommended. There is no pleural effusion or pneumothorax. The cardiac silhouette is within normal limits. Coronary vascular calcification and aortic valve repair noted. Atherosclerotic calcification of the aorta. No acute osseous pathology. IMPRESSION: Findings concerning for right upper lobe and lingular pneumonia. Clinical correlation and follow-up recommended. Electronically Signed   By: Anner Crete M.D.   On: 12/19/2019 17:16    EKG: Independently reviewed.  Sinus rhythm, rate of 80, QTc 477.  1 PAC.  Assessment/Plan Active Problems:   Pelvic fracture (HCC)  Pelvic fracture Fall -Presented with pain on ambulation following a mechanical fall, found to have a nondisplaced fracture of the left inferior pubic ramus on CT of the pelvis.  Likely pathologic fracture in the setting of his metastatic prostate cancer. -Ortho Dr. Doreatha Martin reviewed scans, recommended nonoperative management, weightbearing as  tolerated. -Physical therapy evaluation, pain control  Right-sided aspiration pneumonia -Per patient and family he has been consistently aspirating, which has been evaluated outpatient by speech-language pathology.  Per the patient's goals of care he does not wish for strict dietary modification, and understands that this is likely to increase his risk of respiratory complications including pneumonia. -Patient presents with mild cough, no oxygen requirement no leukocytosis, with radiographic findings of predominantly right upper  lobe and right lower lobe pneumonia.  Given his history and distribution, most consistent with aspiration pneumonia, though differential includes other atypical etiologies. -Given that the patient is on chronic suppressive amoxicillin for his recurrent enterococcal UTI as well as a prolonged course of doxycycline for his bullous pemphigoid, as well as multiple recent healthcare exposures, there is a strong possibility his pneumonia may be caused to a multidrug-resistant organism. -Status post azithromycin, Rocephin x1 in ED for CAP coverage. Plan: -Continue his doxycycline for his bullous pemphigoid for now, we will hold his amoxicillin and administer Unasyn for now -Monitor clinically, obtain procalcitonin, sputum culture -If clinically worsening may warrant additional evaluation for fungal or acid-fast organisms, though seems less likely at this time -Follow blood cultures  Metastatic prostate cancer -Continue Xtandi -CT of his pelvis additionally noticed a sclerotic lesion in the superior aspect of the left acetabulum and right iliac bone which was new since his prior CT and concerning for metastatic disease.  Recommended further evaluation with MRI or bone scan.  This finding was communicated with the patient.  Likely a result of his metastatic prostate cancer. -At this time plan for patient to have outpatient follow-up with his oncologist for further evaluation if  indicated.  Chronic ITP -Patient has chronic ITP baseline platelets seem around 50, his platelet level was not measurable on arrival due to clumping.  Will order immature platelet fraction to further evaluate.  Coronary disease Hyperlipidemia -Continue Coreg, ASA, Imdur  Bullous pemphigoid -Continue doxycycline as above, continue methotrexate   DVT prophylaxis: Lovenox Code Status: DNR Family Communication: Daughter [id number] Disposition Plan: Anticipate possible discharge to skilled nursing facility versus home with home health PT in 1 to 3 days Consults called: Ortho Dr. Doreatha Martin reviewed images Admission status: MedSurg obs   S. Carolyne Fiscal, DO [facility] Pager [phone]  If 7PM-7AM, please contact night-coverage [email or web address] Password [facility]  12/19/2019, 8:44 PM

## 2019-12-20 DIAGNOSIS — I2510 Atherosclerotic heart disease of native coronary artery without angina pectoris: Secondary | ICD-10-CM | POA: Diagnosis present

## 2019-12-20 DIAGNOSIS — K219 Gastro-esophageal reflux disease without esophagitis: Secondary | ICD-10-CM | POA: Diagnosis present

## 2019-12-20 DIAGNOSIS — I959 Hypotension, unspecified: Secondary | ICD-10-CM | POA: Diagnosis not present

## 2019-12-20 DIAGNOSIS — Z20822 Contact with and (suspected) exposure to covid-19: Secondary | ICD-10-CM | POA: Diagnosis present

## 2019-12-20 DIAGNOSIS — Z8673 Personal history of transient ischemic attack (TIA), and cerebral infarction without residual deficits: Secondary | ICD-10-CM | POA: Diagnosis not present

## 2019-12-20 DIAGNOSIS — H8110 Benign paroxysmal vertigo, unspecified ear: Secondary | ICD-10-CM | POA: Diagnosis present

## 2019-12-20 DIAGNOSIS — Z66 Do not resuscitate: Secondary | ICD-10-CM | POA: Diagnosis present

## 2019-12-20 DIAGNOSIS — E785 Hyperlipidemia, unspecified: Secondary | ICD-10-CM | POA: Diagnosis present

## 2019-12-20 DIAGNOSIS — R2689 Other abnormalities of gait and mobility: Secondary | ICD-10-CM | POA: Diagnosis not present

## 2019-12-20 DIAGNOSIS — B001 Herpesviral vesicular dermatitis: Secondary | ICD-10-CM | POA: Diagnosis present

## 2019-12-20 DIAGNOSIS — S32301A Unspecified fracture of right ilium, initial encounter for closed fracture: Secondary | ICD-10-CM | POA: Diagnosis not present

## 2019-12-20 DIAGNOSIS — Z7401 Bed confinement status: Secondary | ICD-10-CM | POA: Diagnosis not present

## 2019-12-20 DIAGNOSIS — R319 Hematuria, unspecified: Secondary | ICD-10-CM | POA: Diagnosis present

## 2019-12-20 DIAGNOSIS — E876 Hypokalemia: Secondary | ICD-10-CM | POA: Diagnosis present

## 2019-12-20 DIAGNOSIS — Z923 Personal history of irradiation: Secondary | ICD-10-CM | POA: Diagnosis not present

## 2019-12-20 DIAGNOSIS — I700 Atherosclerosis of aorta: Secondary | ICD-10-CM | POA: Diagnosis present

## 2019-12-20 DIAGNOSIS — L120 Bullous pemphigoid: Secondary | ICD-10-CM | POA: Diagnosis present

## 2019-12-20 DIAGNOSIS — Z86006 Personal history of melanoma in-situ: Secondary | ICD-10-CM | POA: Diagnosis not present

## 2019-12-20 DIAGNOSIS — S32301D Unspecified fracture of right ilium, subsequent encounter for fracture with routine healing: Secondary | ICD-10-CM | POA: Diagnosis not present

## 2019-12-20 DIAGNOSIS — Z952 Presence of prosthetic heart valve: Secondary | ICD-10-CM | POA: Diagnosis not present

## 2019-12-20 DIAGNOSIS — I10 Essential (primary) hypertension: Secondary | ICD-10-CM | POA: Diagnosis present

## 2019-12-20 DIAGNOSIS — C61 Malignant neoplasm of prostate: Secondary | ICD-10-CM | POA: Diagnosis not present

## 2019-12-20 DIAGNOSIS — I350 Nonrheumatic aortic (valve) stenosis: Secondary | ICD-10-CM | POA: Diagnosis not present

## 2019-12-20 DIAGNOSIS — E440 Moderate protein-calorie malnutrition: Secondary | ICD-10-CM | POA: Diagnosis present

## 2019-12-20 DIAGNOSIS — M2550 Pain in unspecified joint: Secondary | ICD-10-CM | POA: Diagnosis not present

## 2019-12-20 DIAGNOSIS — W1830XA Fall on same level, unspecified, initial encounter: Secondary | ICD-10-CM | POA: Diagnosis present

## 2019-12-20 DIAGNOSIS — D693 Immune thrombocytopenic purpura: Secondary | ICD-10-CM | POA: Diagnosis present

## 2019-12-20 DIAGNOSIS — S32592A Other specified fracture of left pubis, initial encounter for closed fracture: Secondary | ICD-10-CM | POA: Diagnosis present

## 2019-12-20 DIAGNOSIS — F0390 Unspecified dementia without behavioral disturbance: Secondary | ICD-10-CM | POA: Diagnosis present

## 2019-12-20 DIAGNOSIS — R278 Other lack of coordination: Secondary | ICD-10-CM | POA: Diagnosis not present

## 2019-12-20 DIAGNOSIS — Z85830 Personal history of malignant neoplasm of bone: Secondary | ICD-10-CM | POA: Diagnosis not present

## 2019-12-20 DIAGNOSIS — R1312 Dysphagia, oropharyngeal phase: Secondary | ICD-10-CM | POA: Diagnosis not present

## 2019-12-20 DIAGNOSIS — S32502D Unspecified fracture of left pubis, subsequent encounter for fracture with routine healing: Secondary | ICD-10-CM | POA: Diagnosis not present

## 2019-12-20 DIAGNOSIS — J690 Pneumonitis due to inhalation of food and vomit: Secondary | ICD-10-CM | POA: Diagnosis present

## 2019-12-20 DIAGNOSIS — R2681 Unsteadiness on feet: Secondary | ICD-10-CM | POA: Diagnosis not present

## 2019-12-20 DIAGNOSIS — R531 Weakness: Secondary | ICD-10-CM | POA: Diagnosis not present

## 2019-12-20 DIAGNOSIS — R569 Unspecified convulsions: Secondary | ICD-10-CM | POA: Diagnosis not present

## 2019-12-20 DIAGNOSIS — F329 Major depressive disorder, single episode, unspecified: Secondary | ICD-10-CM | POA: Diagnosis present

## 2019-12-20 DIAGNOSIS — S3289XA Fracture of other parts of pelvis, initial encounter for closed fracture: Secondary | ICD-10-CM | POA: Diagnosis not present

## 2019-12-20 DIAGNOSIS — Z8546 Personal history of malignant neoplasm of prostate: Secondary | ICD-10-CM | POA: Diagnosis not present

## 2019-12-20 LAB — BASIC METABOLIC PANEL
Anion gap: 13 (ref 5–15)
BUN: 10 mg/dL (ref 8–23)
CO2: 23 mmol/L (ref 22–32)
Calcium: 8.1 mg/dL — ABNORMAL LOW (ref 8.9–10.3)
Chloride: 97 mmol/L — ABNORMAL LOW (ref 98–111)
Creatinine, Ser: 0.69 mg/dL (ref 0.61–1.24)
GFR calc Af Amer: >60 mL/min (ref >60)
GFR calc non Af Amer: >60 mL/min (ref >60)
Glucose, Bld: 110 mg/dL — ABNORMAL HIGH (ref 70–99)
Potassium: 3.2 mmol/L — ABNORMAL LOW (ref 3.5–5.1)
Sodium: 133 mmol/L — ABNORMAL LOW (ref 135–145)

## 2019-12-20 LAB — CBC WITH DIFFERENTIAL/PLATELET
Abs Immature Granulocytes: 0.02 10*3/uL (ref 0.00–0.07)
Basophils Absolute: 0 10*3/uL (ref 0.0–0.1)
Basophils Relative: 0 %
Eosinophils Absolute: 0 10*3/uL (ref 0.0–0.5)
Eosinophils Relative: 1 %
HCT: 29.7 % — ABNORMAL LOW (ref 39.0–52.0)
Hemoglobin: 10 g/dL — ABNORMAL LOW (ref 13.0–17.0)
Immature Granulocytes: 0 %
Lymphocytes Relative: 20 %
Lymphs Abs: 0.9 10*3/uL (ref 0.7–4.0)
MCH: 32.3 pg (ref 26.0–34.0)
MCHC: 33.7 g/dL (ref 30.0–36.0)
MCV: 95.8 fL (ref 80.0–100.0)
Monocytes Absolute: 0.4 10*3/uL (ref 0.1–1.0)
Monocytes Relative: 9 %
Neutro Abs: 3.2 10*3/uL (ref 1.7–7.7)
Neutrophils Relative %: 70 %
Platelets: 81 10*3/uL — ABNORMAL LOW (ref 150–400)
RBC: 3.1 MIL/uL — ABNORMAL LOW (ref 4.22–5.81)
RDW: 14.1 % (ref 11.5–15.5)
WBC: 4.6 10*3/uL (ref 4.0–10.5)
nRBC: 0 % (ref 0.0–0.2)

## 2019-12-20 LAB — URINALYSIS, ROUTINE W REFLEX MICROSCOPIC
Bilirubin Urine: NEGATIVE
Glucose, UA: NEGATIVE mg/dL
Hgb urine dipstick: NEGATIVE
Ketones, ur: 20 mg/dL — AB
Leukocytes,Ua: NEGATIVE
Nitrite: NEGATIVE
Protein, ur: NEGATIVE mg/dL
Specific Gravity, Urine: 1.005 (ref 1.005–1.030)
pH: 6 (ref 5.0–8.0)

## 2019-12-20 LAB — IMMATURE PLATELET FRACTION: Immature Platelet Fraction: 17.8 % — ABNORMAL HIGH (ref 1.2–8.6)

## 2019-12-20 LAB — PROCALCITONIN: Procalcitonin: <0.1 ng/mL

## 2019-12-20 MED ORDER — JUVEN PO PACK
1.0000 | PACK | Freq: Two times a day (BID) | ORAL | Status: DC
  Administered 2019-12-20 – 2019-12-24 (×8): 1 via ORAL
  Filled 2019-12-20 (×8): qty 1

## 2019-12-20 MED ORDER — ENSURE ENLIVE PO LIQD
237.0000 mL | Freq: Two times a day (BID) | ORAL | Status: DC
  Administered 2019-12-20 – 2019-12-24 (×7): 237 mL via ORAL

## 2019-12-20 MED ORDER — POTASSIUM CHLORIDE CRYS ER 20 MEQ PO TBCR
40.0000 meq | EXTENDED_RELEASE_TABLET | Freq: Two times a day (BID) | ORAL | Status: AC
  Administered 2019-12-20 – 2019-12-21 (×3): 40 meq via ORAL
  Filled 2019-12-20 (×3): qty 2

## 2019-12-20 MED ORDER — ADULT MULTIVITAMIN W/MINERALS CH
1.0000 | ORAL_TABLET | Freq: Every day | ORAL | Status: DC
  Administered 2019-12-20 – 2019-12-24 (×5): 1 via ORAL
  Filled 2019-12-20 (×5): qty 1

## 2019-12-20 MED ORDER — SODIUM CHLORIDE 0.9 % IV SOLN
1.5000 g | Freq: Four times a day (QID) | INTRAVENOUS | Status: DC
  Administered 2019-12-20 – 2019-12-22 (×9): 1.5 g via INTRAVENOUS
  Filled 2019-12-20: qty 4
  Filled 2019-12-20 (×2): qty 1.5
  Filled 2019-12-20 (×4): qty 4
  Filled 2019-12-20: qty 1.5
  Filled 2019-12-20: qty 4
  Filled 2019-12-20: qty 1.5

## 2019-12-20 NOTE — Progress Notes (Signed)
Initial Nutrition Assessment  DOCUMENTATION CODES:   Not applicable  INTERVENTION:  Ensure Enlive po BID, each supplement provides 350 kcal and 20 grams of protein (chocolate)  Magic cup TID with meals, each supplement provides 290 kcal and 9 grams of protein  1 packet Juven BID, each packet provides 95 calories, 2.5 grams of protein (collagen), and 9.8 grams of carbohydrate (3 grams sugar); also contains 7 grams of L-arginine and L-glutamine, 300 mg vitamin C, 15 mg vitamin E, 1.2 mcg vitamin B-12, 9.5 mg zinc, 200 mg calcium, and 1.5 g  Calcium Beta-hydroxy-Beta-methylbutyrate to support wound healing  MVI daily  Patient may benefit from appetite stimulant  NUTRITION DIAGNOSIS:   Inadequate oral intake related to cancer and cancer related treatments, decreased appetite(metasttic prostate cancer) as evidenced by percent weight loss, per patient/family report, energy intake < or equal to 75% for > or equal to 1 month.   GOAL:   Patient will meet greater than or equal to 90% of their needs  MONITOR:   Labs, I & O's, Supplement acceptance, PO intake, Weight trends  REASON FOR ASSESSMENT:   Consult Assessment of nutrition requirement/status, Diet education  ASSESSMENT:  RD working remotely.  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 CT pelvis noted to have a nondisplaced fracture of left inferior pubic ramus, sclerotic lesion in superior left acetabulum new since prior CT concerning for metastatic disease.  Spoke with patient briefly via phone this afternoon. Patient reports that he does not have his hearing aids with him, limited nutrition  history obtained secondary to [facility]. He reports decreased oral intake prior to previous admission and stated he just does not have much of an appetite anymore. Patient reports that he did not eat any of his lunch today. He endorses drinking Boost at home and likes the chocolate flavor. Patient would prefer Boost supplement but amenable to Ensure and Magic Cup supplements during admission. RD will provide supplements to aid with calorie/protien needs as well as Juven to support wound healing, noted stage 1; buttocks and stage 2; thigh per RN assessment.   Meal intake reviewed, documented with 100% po intake x 1 meal this admission.  Per notes: -Scans reviewed by ortho, recommended nonoperative management, weightbearing as tolerated. Outpatient follow up in 3-4 weeks for repeat x-rays.  -Right sided aspiration pneumonia, pt reports consistently aspirating and evaluated outpatient by SLP. Patient does not wish for strict dietary modifications and understands increased risk of respiratory complications. -MRI or bone scan for evaluation of sclerotic lesion. Outpatient follow-up with oncologist for further evaluation if indicated   Current wt 131.78 lbs Weight history reviewed, noted 6.82 lb (4.9%) wt loss in the past month which is significant for time frame. Given wt losses and history of prostate cancer with new sclerotic lesion suspicious of metastatic disease highly suspect malnutrition, however unable to identify at this time.  Medications reviewed and include: Coreg, Doxycycline, Imdur, Klor-con, B12, Unasyn  Labs: BG 93-110, Na 133 (L), K 3.2 (L), Hgb 10 (L)   NUTRITION - FOCUSED PHYSICAL EXAM: Unable to complete at this time, RD working remotely.  Diet Order:   Diet Order            Diet Heart Room service appropriate?  Yes; Fluid consistency: Thin  Diet effective now              EDUCATION NEEDS:   Not appropriate for education at this time  Skin:  Skin Assessment: Skin Integrity  Issues: Skin Integrity Issues:: Stage I, Stage II Stage I: buttocks Stage II: left thigh  Last BM:  2/6  Height:   Ht Readings from Last 1 Encounters:  12/20/19 5\' 10"  (1.778 m)    Weight:   Wt Readings from Last 1 Encounters:  12/20/19 59.9 kg    Ideal Body Weight:  75.5 kg  BMI:  Body mass index is 18.95 kg/m.  Estimated Nutritional Needs:   Kcal:  1800-2000  Protein:  90-100  Fluid:  >/= 1.5 L/day   Lajuan Lines, RD, LDN Clinical Nutrition  [facility] Telephone [phone] After Hours/Weekend Pager: [phone]

## 2019-12-20 NOTE — Evaluation (Signed)
Occupational Therapy Evaluation Patient Details Name: Corey Oneill MRN: [id number] DOB: 1936/03/22 Today's Date: 12/20/2019    History of Present Illness 84 y.o. 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 Found to have a nondisplaced fracture of the left inferior pubic ramus on CT of the pelvis as well as Right-sided aspiration pneumonia. Admitted 12/19/19 and is WBAT on LLE.     Clinical Impression   PTA patient reports using RW for mobility and completing ADLS with independence, assist with IADLs.  Patient currently requires min guard for transfers, min -setup for UB ADLs, mod assist for LB ADLs and total assist for toileting.  He is limited by impaired balance, pain in L hip, generalized weakness, decreased activity tolerance and impaired cognition. Requires cueing for safety, walker mgmt and sequencing during session.  Patient will benefit from continued OT services while admitted and after dc at [facility] level to optimize independence and return to PLOF, decrease fall risk and increased tolerance to daily activities.      Follow Up Recommendations  Home health OT;Supervision/Assistance - 24 hour    Equipment Recommendations  3 in 1 bedside commode(if patient doesnt have at home already )    Recommendations for Other Services       Precautions / Restrictions Precautions Precautions: Fall Precaution Comments: hx of falling Restrictions Weight Bearing Restrictions: No      Mobility Bed Mobility Overal bed mobility: Needs Assistance Bed Mobility: Supine to Sit     Supine to sit:  Supervision     General bed mobility comments: increased time and effort  Transfers Overall transfer level: Needs assistance Equipment used: Rolling walker (2 wheeled) Transfers: Sit to/from Stand Sit to Stand: Min guard         General transfer comment: min guard to steady for safety, cueing for hand placement and sequencing     Balance Overall balance assessment: Needs assistance Sitting-balance support: No upper extremity supported;Feet supported Sitting balance-Leahy Scale: Fair     Standing balance support: Bilateral upper extremity supported;During functional activity Standing balance-Leahy Scale: Poor Standing balance comment: reliant on BUE and external support                           ADL either performed or assessed with clinical judgement   ADL Overall ADL's : Needs assistance/impaired     Grooming: Sitting;Set up   Upper Body Bathing: Set up;Sitting   Lower Body Bathing: Moderate assistance;Sit to/from stand   Upper Body Dressing : Minimal assistance;Sitting   Lower Body Dressing: Moderate assistance;Sit to/from stand   Toilet Transfer: Min guard;Ambulation;RW;BSC   Toileting- Clothing Manipulation and Hygiene: Total assistance;Sit to/from stand Toileting - Clothing Manipulation Details (indicate cue type and reason): for posterior hygiene, min guard standing      Functional mobility during ADLs: Min guard;Cueing for safety;Cueing for sequencing;Rolling walker General ADL Comments: pt limited by L hip pain, decreased activity tolerance, impaired balance and weakness      Vision         Perception     Praxis      Pertinent Vitals/Pain Pain Assessment: Faces Faces Pain Scale: Hurts a little bit Pain Location:  L hip  Pain Descriptors / Indicators: Discomfort;Grimacing;Guarding Pain Intervention(s): Monitored during session;Repositioned;Limited activity within patient's tolerance     Hand Dominance Right   Extremity/Trunk  Assessment Upper Extremity Assessment Upper Extremity Assessment: Generalized weakness   Lower Extremity Assessment Lower Extremity Assessment: Defer to PT evaluation   Cervical / Trunk Assessment Cervical / Trunk Assessment: Kyphotic   Communication Communication Communication: HOH   Cognition Arousal/Alertness: Awake/alert Behavior During Therapy: WFL for tasks assessed/performed Overall Cognitive Status: History of cognitive impairments - at baseline Area of Impairment: Memory;Safety/judgement;Problem solving                     Memory: Decreased short-term memory;Decreased recall of precautions   Safety/Judgement: Decreased awareness of safety;Decreased awareness of deficits   Problem Solving: Slow processing;Requires verbal cues;Difficulty sequencing General Comments: pt with hx of dementia, follows commands with increased time and min cueing required for safety    General Comments       Exercises     Shoulder Instructions      Home Living Family/patient expects to be discharged to:: Private residence Living Arrangements: Spouse/significant other Available Help at Discharge: Family;Available 24 hours/day Type of Home: House(town house ) Home Access: Stairs to enter [employer] of Steps: 2 Entrance Stairs-Rails: Right Home Layout: One level     Bathroom Shower/Tub: Teacher, early years/pre: Handicapped height     Home Equipment: Environmental consultant - 2 wheels;Cane - single point;Bedside commode          Prior Functioning/Environment Level of Independence: Needs assistance  Gait / Transfers Assistance Needed: uses walker without assist  ADL's / Homemaking Assistance Needed: pt states he is independent ADL's; dependent IADL's            OT Problem List: Decreased strength;Decreased activity tolerance;Impaired balance (sitting and/or standing);Decreased safety awareness;Decreased cognition;Pain      OT Treatment/Interventions:  Self-care/ADL training;Therapeutic exercise;Patient/family education;Balance training;Energy conservation;Therapeutic activities;DME and/or AE instruction;Cognitive remediation/compensation    OT Goals(Current goals can be found in the care plan section) Acute Rehab OT Goals Patient Stated Goal: to get back home OT Goal Formulation: With patient Time For Goal Achievement: 01/03/20 Potential to Achieve Goals: Good  OT Frequency: Min 2X/week   Barriers to D/C:            Co-evaluation PT/OT/SLP Co-Evaluation/Treatment: Yes Reason for Co-Treatment: For patient/therapist safety;Necessary to address cognition/behavior during functional activity PT goals addressed during session: Mobility/safety with mobility OT goals addressed during session: ADL's and self-care      AM-PAC OT "6 Clicks" Daily Activity     Outcome Measure Help from another person eating meals?: None Help from another person taking care of personal grooming?: A Little Help from another person toileting, which includes using toliet, bedpan, or urinal?: Total Help from another person bathing (including washing, rinsing, drying)?: A Lot Help from another person to put on and taking off regular upper body clothing?: A Little Help from another person to put on and taking off regular lower body clothing?: A Lot 6 Click Score: 15   End of Session Equipment Utilized During Treatment: Gait belt;Rolling walker Nurse Communication: Mobility status  Activity Tolerance: Patient tolerated treatment well Patient left: in chair;with call bell/phone within reach;with chair alarm set  OT Visit Diagnosis: Unsteadiness on feet (R26.81);Other abnormalities of gait and mobility (R26.89);Muscle weakness (generalized) (M62.81);Other symptoms and signs involving cognitive function;History of falling (Z91.81)                Time:  [id number] OT Time Calculation (min): 27 min Charges:  OT General Charges $OT Visit: 1 Visit OT Evaluation $OT  Eval Moderate Complexity: 1 Mod  Jolaine Artist, OT Acute Rehabilitation Services Pager [phone] Office [phone]   Delight Stare 12/20/2019, 2:27 PM

## 2019-12-20 NOTE — Progress Notes (Signed)
[facility]                                                                              Patient Demographics  Corey Oneill, is a 84 y.o. male, DOB - Mar 15, 1936, [id number]  Admit date - 12/19/2019   Admitting Physician Margart Sickles, DO  Outpatient Primary MD for the patient is [name], MD  Outpatient specialists:   LOS - 0  days    Chief Complaint  Patient presents with  . Weakness       Brief summary  Corey Oneill is a 84 y.o. 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 The ED patient's O2 sat was 90% on room air and imaging studies noted pneumonic infiltrates with nondisplaced fracture of the left inferior pubic ramus  Patient admitted for aspiration pneumonia and left inferior pubic ramus fracture Assessment & Plan    Active Problems:   Pelvic fracture (HCC)  Aspiration pneumonia -History of aspiration with outpatient speech pathology evaluation -Per the patient's goals of care he does not wish for strict dietary modification, and understands that this is likely to increase his risk of respiratory complications including pneumonia -History of cough without oxygen requirement no leukocytosis-chest x-ray and CT confirmed pneumonic infiltrate -High risk for multi drug resistant organism with history of chronic suppressive amoxicillin for recurrent enterococci UTI prophylactic and doxycycline for bullous pemphigoid -Amoxicillin was held and Unasyn started in admission with continuation of doxycycline  Pelvic fracture -Nondisplaced left inferior pubic ramus fracture -Following mechanical fall -Ortho Dr. Doreatha Martin, recommends WBAT to  LLE, with outpatient follow-up in 3-4 weeks  -Supportive care including pain control -PT OT  Hypokalemia Replete potassium  Moderate protein calorie malnutrition Nutritional support Nutritionist dietitian consulted    Metastatic prostate cancer -Continue Xtandi -CT of his pelvis noted sclerotic lesion in the superior aspect of the left acetabulum and right iliac bone- new since prior CT and concerning for metastatic disease.  -Outpatient follow-up with patient's oncologist on discharge           Code Status: DNR DVT Prophylaxis:  Lovenox  Family Communication: Discussed in detail with the patient, all imaging results, lab results explained to the patient  Disposition Plan: TBD  Time Spent in minutes 35 minutes  Procedures:   Consultants:   Orthopedist  Antimicrobials:      Medications  Scheduled Meds: . aspirin EC  81 mg Oral QPM  . carvedilol  3.125 mg Oral BID AC  . doxycycline  100 mg Oral BID  . DULoxetine  60 mg Oral Daily  . enoxaparin (LOVENOX) injection  40 mg Subcutaneous QHS  . enzalutamide  160 mg Oral Q supper  . ferrous sulfate  325 mg Oral Q breakfast  . isosorbide mononitrate  30 mg Oral Daily  . lamoTRIgine  50 mg Oral BID  . [START ON 12/23/2019] methotrexate  10  mg Oral Q Tue  . rosuvastatin  10 mg Oral Daily  . cyanocobalamin  1,000 mcg Oral Daily   Continuous Infusions: PRN Meds:.acetaminophen **OR** acetaminophen, bisacodyl, ondansetron **OR** ondansetron (ZOFRAN) IV, polyethylene glycol, traMADol   Antibiotics   Anti-infectives (From admission, onward)   Start     Dose/Rate Route Frequency Ordered Stop   12/19/19 2215  doxycycline (VIBRA-TABS) tablet 100 mg     100 mg Oral 2 times daily 12/19/19 2205     12/19/19 1745  cefTRIAXone (ROCEPHIN) 1 g in sodium chloride 0.9 % 100 mL IVPB     1 g 200 mL/hr over 30 Minutes Intravenous  Once 12/19/19 1735 12/19/19 2039   12/19/19 1745  azithromycin (ZITHROMAX) 500 mg in sodium  chloride 0.9 % 250 mL IVPB     500 mg 250 mL/hr over 60 Minutes Intravenous  Once 12/19/19 1735 12/19/19 2039        Subjective:   Cammron Allsopp was seen and examined today.  No fever or chills, pain controlled Objective:   Vitals:   12/19/19 2230 12/19/19 2300 12/20/19 0005 12/20/19 0746  BP: (!) 135/59 (!) 106/46 130/60 121/70  Pulse: 69 69 71 71  Resp: 11 11  16   Temp:   98.1 F (36.7 C) 98.2 F (36.8 C)  TempSrc:   Oral Oral  SpO2: 99% 97% 99% 99%  Weight:   59.9 kg   Height:   5\' 10"  (1.778 m)     Intake/Output Summary (Last 24 hours) at 12/20/2019 1049 Last data filed at 12/20/2019 0900 Gross per 24 hour  Intake 1590 ml  Output 400 ml  Net 1190 ml     Wt Readings from Last 3 Encounters:  12/20/19 59.9 kg  12/01/19 59.9 kg  11/24/19 63 kg     Exam  General: NAD  HEENT: NCAT,  PERRL,MMM  Neck: SUPPLE, (-) JVD  Cardiovascular: RRR, (-) GALLOP, (-) MURMUR  Respiratory: Right lower lung field rhonchi  Gastrointestinal: SOFT, (-) DISTENSION, BS(+), (_) TENDERNESS  Ext: (-) CYANOSIS, (-) EDEMA  Neuro: Alert and responsive, no gross acute focal deficit             Psych:NORMAL AFFECT/MOOD   Data Reviewed:  I have personally reviewed following labs and imaging studies  Micro Results Recent Results (from the past 240 hour(s))  Culture, blood (routine x 2)     Status: None (Preliminary result)   Collection Time: 12/19/19  6:16 PM   Specimen: BLOOD  Result Value Ref Range Status   Specimen Description BLOOD LEFT ANTECUBITAL  Final   Special Requests   Final    BOTTLES DRAWN AEROBIC AND ANAEROBIC Blood Culture adequate volume   Culture   Final    NO GROWTH < 12 HOURS Performed at [facility], [address], [place], [place] [address]    Report Status PENDING  Incomplete  Respiratory Panel by RT PCR (Flu A&B, Covid) - Nasopharyngeal Swab     Status: None   Collection Time: 12/19/19  6:21 PM   Specimen: Nasopharyngeal Swab  Result Value Ref  Range Status   SARS Coronavirus 2 by RT PCR NEGATIVE NEGATIVE Final    Comment: (NOTE) SARS-CoV-2 target nucleic acids are NOT DETECTED. The SARS-CoV-2 RNA is generally detectable in upper respiratoy specimens during the acute phase of infection. The lowest concentration of SARS-CoV-2 viral copies this assay can detect is 131 copies/mL. A negative result does not preclude SARS-Cov-2 infection and should not be used as  the sole basis for treatment or other patient management decisions. A negative result may occur with  improper specimen collection/handling, submission of specimen other than nasopharyngeal swab, presence of viral mutation(s) within the areas targeted by this assay, and inadequate number of viral copies (<131 copies/mL). A negative result must be combined with clinical observations, patient history, and epidemiological information. The expected result is Negative. Fact Sheet for Patients:  PinkCheek.be Fact Sheet for Healthcare Providers:  GravelBags.it This test is not yet ap proved or cleared by the [place] FDA and  has been authorized for detection and/or diagnosis of SARS-CoV-2 by FDA under an Emergency Use Authorization (EUA). This EUA will remain  in effect (meaning this test can be used) for the duration of the COVID-19 declaration under Section 564(b)(1) of the Act, 21 U.S.C. section 360bbb-3(b)(1), unless the authorization is terminated or revoked sooner.    Influenza A by PCR NEGATIVE NEGATIVE Final   Influenza B by PCR NEGATIVE NEGATIVE Final    Comment: (NOTE) The Xpert Xpress SARS-CoV-2/FLU/RSV assay is intended as an aid in  the diagnosis of influenza from Nasopharyngeal swab specimens and  should not be used as a sole basis for treatment. Nasal washings and  aspirates are unacceptable for Xpert Xpress SARS-CoV-2/FLU/RSV  testing. Fact Sheet for  Patients: PinkCheek.be Fact Sheet for Healthcare Providers: GravelBags.it This test is not yet approved or cleared by the [place] FDA and  has been authorized for detection and/or diagnosis of SARS-CoV-2 by  FDA under an Emergency Use Authorization (EUA). This EUA will remain  in effect (meaning this test can be used) for the duration of the  Covid-19 declaration under Section 564(b)(1) of the Act, 21  U.S.C. section 360bbb-3(b)(1), unless the authorization is  terminated or revoked. Performed at [facility], [place] [address] [place], [place] [address]     Radiology Reports EEG  Result Date: 11/25/2019 Lora Havens, MD     11/25/2019  5:42 PM Patient Name: Corey Oneill MRN: [id number] Epilepsy Attending: Lora Havens Referring Physician/Provider: Dr Kerney Elbe Date: 11/25/2019 Duration: 23.41 mins Patient history: 84 y.o. male with h/o "staring spells" presenting after acute onset of aphasia, right facial droop and right sided weakness at home. EEG to evaluate for seizure. Level of alertness: awake AEDs during EEG study: None Technical aspects: This EEG study was done with scalp electrodes positioned according to the 10-20 International system of electrode placement. Electrical activity was acquired at a sampling rate of 500Hz  and reviewed with a high frequency filter of 70Hz  and a low frequency filter of 1Hz . EEG data were recorded continuously and digitally stored. DESCRIPTION: No clear posterior dominant rhythm was seen. EEG showed continuous generalized 3-5hz  theta-delta slowing as well as intermittent rhythmic generalized 2-3Hz  delta slowing.  Hyperventilation and photic stimulation were not performed. ABNORMALITY - Continuous slow, generalized - Intermittent rhythmic slow, generalized IMPRESSION: This study is suggestive of moderate diffuse encephalopathy, non specific to etiology.  No seizures or epileptiform  discharges were seen throughout the recording. Lora Havens   CT Code Stroke CTA Head W/WO contrast  Result Date: 11/25/2019 CLINICAL DATA:  Neuro deficit, acute, stroke suspected. EXAM: CT ANGIOGRAPHY HEAD AND NECK CT PERFUSION BRAIN TECHNIQUE: Multidetector CT imaging of the head and neck was performed using the standard protocol during bolus administration of intravenous contrast. Multiplanar CT image reconstructions and MIPs were obtained to evaluate the vascular anatomy. Carotid stenosis measurements (when applicable) are obtained utilizing NASCET criteria, using the distal  internal carotid diameter as the denominator. Multiphase CT imaging of the brain was performed following IV bolus contrast injection. Subsequent parametric perfusion maps were calculated using RAPID software. CONTRAST:  113mL OMNIPAQUE IOHEXOL 350 MG/ML SOLN COMPARISON:  Noncontrast head CT performed earlier the same day, CT angiogram head/neck 11/14/2019 FINDINGS: CTA NECK FINDINGS Aortic arch: Standard aortic branching. Scattered soft and calcified plaque within the visualized aortic arch and proximal major branch vessels of the neck. Right carotid system: CCA widely patent to the bifurcation. Mild atherosclerotic plaque within the carotid bifurcation and proximal ICA. No measurable stenosis of the proximal ICA relative to the more distal vessel. Left carotid system: CCA patent to the bifurcation. Soft and calcified plaque within the carotid bifurcation and proximal ICA. No measurable stenosis of the proximal ICA as compared to the more distal vessel. Vertebral arteries: The left vertebral artery is significantly dominant. The non dominant right vertebral artery is patent throughout the neck without significant stenosis (50% or greater). Mixed plaque results in unchanged moderate/severe ostial stenosis at the origin of the dominant left vertebral artery. Distal to this, the left vertebral artery is patent within the neck without  significant stenosis (50% or greater). Skeleton: No acute bony abnormality. Cervical spondylosis with multilevel shallow posterior disc osteophytes, uncovertebral and facet hypertrophy. No high-grade bony spinal canal stenosis. Other neck: No neck mass or cervical lymphadenopathy. Subcentimeter right thyroid lobe nodule, not meeting consensus criteria for ultrasound follow-up. Upper chest: Ill-defined opacity within the posterior aspect of the partially visualized right upper lobe suspicious for pneumonia. Review of the MIP images confirms the above findings CTA HEAD FINDINGS Anterior circulation: The intracranial internal carotid arteries are patent bilaterally with scattered calcified plaque but no more than mild stenosis. The right middle and anterior cerebral arteries are patent without proximal branch occlusion or high-grade proximal stenosis. The left middle cerebral artery is patent without proximal branch occlusion or high-grade proximal arterial stenosis identified. The left anterior cerebral artery is patent without significant proximal stenosis. Redemonstrated multifocal high-grade stenoses within distal left anterior cerebral artery branches (for instance as seen on series 6, image 58) (series 11, image 23). No intracranial aneurysm is identified. Posterior circulation: The non dominant intracranial right vertebral artery is patent and appears to terminate as the right PICA. The dominant intracranial left vertebral artery is patent without significant stenosis, as is the basilar artery. The bilateral posterior cerebral arteries are patent without significant proximal stenosis. Posterior communicating arteries are poorly delineated and may be hypoplastic or absent bilaterally. Venous sinuses: Within limitations of contrast timing, no convincing thrombus. Anatomic variants: As described Review of the MIP images confirms the above findings CT Brain Perfusion Findings: CBF (<30%) Volume: None Perfusion  (Tmax>6.0s) volume: None Mismatch Volume: None Infarction Location: None These results were communicated to Dr. Cheral Marker At 3:33 pmon 1/12/2021by text page via the [facility] messaging system. IMPRESSION: CTA neck: 1. The bilateral common and internal carotid arteries are patent within the neck without significant stenosis. Mild plaque at the carotid bifurcations and within the proximal ICAs. 2. Significantly dominant left vertebral artery with redemonstrated moderate/severe ostial stenosis. The vertebral arteries are otherwise patent within the neck without significant stenosis. 3. Ill-defined airspace opacity within the posterior aspect of the partially imaged right upper lobe suspicious for pneumonia. CTA head: 1. No intracranial large vessel occlusion or proximal high-grade arterial stenosis identified. 2. Redemonstrated multifocal high-grade stenoses within distal left anterior cerebral artery branches. 3. Calcified atherosclerosis within the intracranial ICAs, but no more than mild stenosis.  CT perfusion head: 1. The perfusion software detects no core infarct. 2. The perfusion software detects no region of critically hypoperfused parenchyma utilizing a Tmax>6 seconds threshold. Electronically Signed   By: Kellie Simmering DO   On: 11/25/2019 15:31   CT Head Wo Contrast  Result Date: 12/19/2019 CLINICAL DATA:  Neuro deficits, subacute. EXAM: CT HEAD WITHOUT CONTRAST TECHNIQUE: Contiguous axial images were obtained from the base of the skull through the vertex without intravenous contrast. COMPARISON:  November 25, 2019 FINDINGS: Brain: There is moderate severity cerebral atrophy with widening of the extra-axial spaces and ventricular dilatation. There are areas of decreased attenuation within the white matter tracts of the supratentorial brain, consistent with microvascular disease changes. Vascular: No hyperdense vessel or unexpected calcification. Skull: Normal. Negative for fracture or focal lesion. Sinuses/Orbits:  No acute finding. Other: None. IMPRESSION: 1. No acute intracranial pathology. 2. Brain atrophy and microvascular disease changes within the white matter tracts of the supratentorial brain. Electronically Signed   By: Virgina Norfolk M.D.   On: 12/19/2019 18:24   CT Code Stroke CTA Neck W/WO contrast  Result Date: 11/25/2019 CLINICAL DATA:  Neuro deficit, acute, stroke suspected. EXAM: CT ANGIOGRAPHY HEAD AND NECK CT PERFUSION BRAIN TECHNIQUE: Multidetector CT imaging of the head and neck was performed using the standard protocol during bolus administration of intravenous contrast. Multiplanar CT image reconstructions and MIPs were obtained to evaluate the vascular anatomy. Carotid stenosis measurements (when applicable) are obtained utilizing NASCET criteria, using the distal internal carotid diameter as the denominator. Multiphase CT imaging of the brain was performed following IV bolus contrast injection. Subsequent parametric perfusion maps were calculated using RAPID software. CONTRAST:  174mL OMNIPAQUE IOHEXOL 350 MG/ML SOLN COMPARISON:  Noncontrast head CT performed earlier the same day, CT angiogram head/neck 11/14/2019 FINDINGS: CTA NECK FINDINGS Aortic arch: Standard aortic branching. Scattered soft and calcified plaque within the visualized aortic arch and proximal major branch vessels of the neck. Right carotid system: CCA widely patent to the bifurcation. Mild atherosclerotic plaque within the carotid bifurcation and proximal ICA. No measurable stenosis of the proximal ICA relative to the more distal vessel. Left carotid system: CCA patent to the bifurcation. Soft and calcified plaque within the carotid bifurcation and proximal ICA. No measurable stenosis of the proximal ICA as compared to the more distal vessel. Vertebral arteries: The left vertebral artery is significantly dominant. The non dominant right vertebral artery is patent throughout the neck without significant stenosis (50% or  greater). Mixed plaque results in unchanged moderate/severe ostial stenosis at the origin of the dominant left vertebral artery. Distal to this, the left vertebral artery is patent within the neck without significant stenosis (50% or greater). Skeleton: No acute bony abnormality. Cervical spondylosis with multilevel shallow posterior disc osteophytes, uncovertebral and facet hypertrophy. No high-grade bony spinal canal stenosis. Other neck: No neck mass or cervical lymphadenopathy. Subcentimeter right thyroid lobe nodule, not meeting consensus criteria for ultrasound follow-up. Upper chest: Ill-defined opacity within the posterior aspect of the partially visualized right upper lobe suspicious for pneumonia. Review of the MIP images confirms the above findings CTA HEAD FINDINGS Anterior circulation: The intracranial internal carotid arteries are patent bilaterally with scattered calcified plaque but no more than mild stenosis. The right middle and anterior cerebral arteries are patent without proximal branch occlusion or high-grade proximal stenosis. The left middle cerebral artery is patent without proximal branch occlusion or high-grade proximal arterial stenosis identified. The left anterior cerebral artery is patent without significant proximal stenosis. Redemonstrated  multifocal high-grade stenoses within distal left anterior cerebral artery branches (for instance as seen on series 6, image 58) (series 11, image 23). No intracranial aneurysm is identified. Posterior circulation: The non dominant intracranial right vertebral artery is patent and appears to terminate as the right PICA. The dominant intracranial left vertebral artery is patent without significant stenosis, as is the basilar artery. The bilateral posterior cerebral arteries are patent without significant proximal stenosis. Posterior communicating arteries are poorly delineated and may be hypoplastic or absent bilaterally. Venous sinuses: Within  limitations of contrast timing, no convincing thrombus. Anatomic variants: As described Review of the MIP images confirms the above findings CT Brain Perfusion Findings: CBF (<30%) Volume: None Perfusion (Tmax>6.0s) volume: None Mismatch Volume: None Infarction Location: None These results were communicated to Dr. Cheral Marker At 3:33 pmon 1/12/2021by text page via the [facility] messaging system. IMPRESSION: CTA neck: 1. The bilateral common and internal carotid arteries are patent within the neck without significant stenosis. Mild plaque at the carotid bifurcations and within the proximal ICAs. 2. Significantly dominant left vertebral artery with redemonstrated moderate/severe ostial stenosis. The vertebral arteries are otherwise patent within the neck without significant stenosis. 3. Ill-defined airspace opacity within the posterior aspect of the partially imaged right upper lobe suspicious for pneumonia. CTA head: 1. No intracranial large vessel occlusion or proximal high-grade arterial stenosis identified. 2. Redemonstrated multifocal high-grade stenoses within distal left anterior cerebral artery branches. 3. Calcified atherosclerosis within the intracranial ICAs, but no more than mild stenosis. CT perfusion head: 1. The perfusion software detects no core infarct. 2. The perfusion software detects no region of critically hypoperfused parenchyma utilizing a Tmax>6 seconds threshold. Electronically Signed   By: Kellie Simmering DO   On: 11/25/2019 15:31   CT Chest Wo Contrast  Result Date: 12/19/2019 CLINICAL DATA:  Evaluate for pneumonia. EXAM: CT CHEST WITHOUT CONTRAST TECHNIQUE: Multidetector CT imaging of the chest was performed following the standard protocol without IV contrast. COMPARISON:  None. FINDINGS: Cardiovascular: There is mild to moderate severity calcification of the aortic arch. An artificial aortic valve is seen. Normal heart size. No pericardial effusion. Marked severity coronary artery calcification is  seen. Mediastinum/Nodes: A cluster of subcentimeter calcified lymph nodes is seen along the left hilum. Lungs/Pleura: Mild to moderate severity patchy infiltrate is seen within the posterolateral aspect of the right upper lobe and posterior aspect of the right lower lobe. Mild areas of bibasilar linear atelectasis are seen. There is no evidence of a pleural effusion or pneumothorax. Upper Abdomen: Noninflamed diverticula are seen along the splenic flexure. Musculoskeletal: Multilevel degenerative changes seen throughout the thoracic spine. IMPRESSION: Mild-to-moderate severity patchy right upper lobe and right lower lobe infiltrates. Aortic Atherosclerosis (ICD10-I70.0). Electronically Signed   By: Virgina Norfolk M.D.   On: 12/19/2019 18:56   CT Lumbar Spine Wo Contrast  Result Date: 12/19/2019 CLINICAL DATA:  Low back pain. EXAM: CT LUMBAR SPINE WITHOUT CONTRAST TECHNIQUE: Multidetector CT imaging of the lumbar spine was performed without intravenous contrast administration. Multiplanar CT image reconstructions were also generated. COMPARISON:  None. FINDINGS: Segmentation: 5 lumbar type vertebrae. Alignment: There is approximately 4 mm retrolisthesis of the L5 vertebral body on S1. Vertebrae: No acute fracture. A 6 mm sclerotic focus is seen within the lateral aspect of the L4 vertebral body on the left. Paraspinal and other soft tissues: There is marked severity calcification of the abdominal aorta. Disc levels: L1-2: Mild to moderate endplates sclerosis. Mild intervertebral disc space narrowing. Bilateral facet joint hypertrophy. Normal central  canal and bilateral lateral recesses. Narrowing of the bilateral intervertebral neural foramina. L2-3: Mild-to-moderate endplates sclerosis. Mild intervertebral disc space narrowing. Bilateral facet joint hypertrophy. Normal central canal and bilateral lateral recesses. Narrowing of the bilateral intervertebral neural foramina. L3-4: Mild to moderate endplates  sclerosis. Mild intervertebral disc space narrowing. Bilateral facet joint hypertrophy. Normal central canal and bilateral lateral recesses. Narrowing of the bilateral intervertebral neural foramina. L4-5: Mild-to-moderate endplates sclerosis. Mild intervertebral disc space narrowing. Bilateral facet joint hypertrophy. Normal central canal and bilateral lateral recesses. Narrowing of the bilateral intervertebral neural foramina. L5-S1: Moderate-severity endplates sclerosis. Marked severity intervertebral disc space narrowing. Bilateral facet joint hypertrophy. Normal central canal and bilateral lateral recesses. Narrowing of the bilateral intervertebral neural foramina. IMPRESSION: 1. No acute osseous abnormality. 2. Multilevel degenerative changes, most prominent at the level of L5-S1. 3. Approximately 4 mm retrolisthesis of the L5 vertebral body on S1. Electronically Signed   By: Virgina Norfolk M.D.   On: 12/19/2019 18:40   CT PELVIS WO CONTRAST  Result Date: 12/19/2019 CLINICAL DATA:  84 year old male with pelvic trauma. EXAM: CT PELVIS WITHOUT CONTRAST TECHNIQUE: Multidetector CT imaging of the pelvis was performed following the standard protocol without intravenous contrast. COMPARISON:  CT of the abdomen pelvis dated 01/25/2016. FINDINGS: Evaluation of this exam is limited in the absence of intravenous contrast. Evaluation is also limited due to respiratory motion artifact. Urinary Tract: The visualized ureters and urinary bladder appear unremarkable. Bowel:  Unremarkable visualized pelvic bowel loops. Vascular/Lymphatic: Advanced aortoiliac atherosclerotic disease. Reproductive:  Prostate fiducial markers noted. Other:  None Musculoskeletal: There is a nondisplaced fracture of the left inferior pubic ramus. Linear cortical lucency in the left ischial tuberosity (axial series 3, image 108 and coronal series 6 images 68 and 72) may represent vascular groove and less likely a nondisplaced fracture. No  other acute fracture identified. The bones are osteopenic. There is no dislocation. There is a patchy area of sclerotic changes in the superior left acetabulum as well as a subcentimeter sclerotic focus in the posterior right iliac bone most concerning for metastatic disease, likely from prostate cancer. Clinical correlation is recommended. Further evaluation with MRI or bone scan recommended. IMPRESSION: 1. Nondisplaced fracture of the left inferior pubic ramus. 2. Sclerotic lesion in the superior aspect of the left acetabulum and right iliac bone new since the prior CT and most concerning for metastatic disease. Further evaluation with MRI or bone scan recommended. Electronically Signed   By: Anner Crete M.D.   On: 12/19/2019 18:55   CT Code Stroke Cerebral Perfusion with contrast  Result Date: 11/25/2019 CLINICAL DATA:  Neuro deficit, acute, stroke suspected. EXAM: CT ANGIOGRAPHY HEAD AND NECK CT PERFUSION BRAIN TECHNIQUE: Multidetector CT imaging of the head and neck was performed using the standard protocol during bolus administration of intravenous contrast. Multiplanar CT image reconstructions and MIPs were obtained to evaluate the vascular anatomy. Carotid stenosis measurements (when applicable) are obtained utilizing NASCET criteria, using the distal internal carotid diameter as the denominator. Multiphase CT imaging of the brain was performed following IV bolus contrast injection. Subsequent parametric perfusion maps were calculated using RAPID software. CONTRAST:  151mL OMNIPAQUE IOHEXOL 350 MG/ML SOLN COMPARISON:  Noncontrast head CT performed earlier the same day, CT angiogram head/neck 11/14/2019 FINDINGS: CTA NECK FINDINGS Aortic arch: Standard aortic branching. Scattered soft and calcified plaque within the visualized aortic arch and proximal major branch vessels of the neck. Right carotid system: CCA widely patent to the bifurcation. Mild atherosclerotic plaque within the carotid  bifurcation and proximal ICA. No measurable stenosis of the proximal ICA relative to the more distal vessel. Left carotid system: CCA patent to the bifurcation. Soft and calcified plaque within the carotid bifurcation and proximal ICA. No measurable stenosis of the proximal ICA as compared to the more distal vessel. Vertebral arteries: The left vertebral artery is significantly dominant. The non dominant right vertebral artery is patent throughout the neck without significant stenosis (50% or greater). Mixed plaque results in unchanged moderate/severe ostial stenosis at the origin of the dominant left vertebral artery. Distal to this, the left vertebral artery is patent within the neck without significant stenosis (50% or greater). Skeleton: No acute bony abnormality. Cervical spondylosis with multilevel shallow posterior disc osteophytes, uncovertebral and facet hypertrophy. No high-grade bony spinal canal stenosis. Other neck: No neck mass or cervical lymphadenopathy. Subcentimeter right thyroid lobe nodule, not meeting consensus criteria for ultrasound follow-up. Upper chest: Ill-defined opacity within the posterior aspect of the partially visualized right upper lobe suspicious for pneumonia. Review of the MIP images confirms the above findings CTA HEAD FINDINGS Anterior circulation: The intracranial internal carotid arteries are patent bilaterally with scattered calcified plaque but no more than mild stenosis. The right middle and anterior cerebral arteries are patent without proximal branch occlusion or high-grade proximal stenosis. The left middle cerebral artery is patent without proximal branch occlusion or high-grade proximal arterial stenosis identified. The left anterior cerebral artery is patent without significant proximal stenosis. Redemonstrated multifocal high-grade stenoses within distal left anterior cerebral artery branches (for instance as seen on series 6, image 58) (series 11, image 23). No  intracranial aneurysm is identified. Posterior circulation: The non dominant intracranial right vertebral artery is patent and appears to terminate as the right PICA. The dominant intracranial left vertebral artery is patent without significant stenosis, as is the basilar artery. The bilateral posterior cerebral arteries are patent without significant proximal stenosis. Posterior communicating arteries are poorly delineated and may be hypoplastic or absent bilaterally. Venous sinuses: Within limitations of contrast timing, no convincing thrombus. Anatomic variants: As described Review of the MIP images confirms the above findings CT Brain Perfusion Findings: CBF (<30%) Volume: None Perfusion (Tmax>6.0s) volume: None Mismatch Volume: None Infarction Location: None These results were communicated to Dr. Cheral Marker At 3:33 pmon 1/12/2021by text page via the [facility] messaging system. IMPRESSION: CTA neck: 1. The bilateral common and internal carotid arteries are patent within the neck without significant stenosis. Mild plaque at the carotid bifurcations and within the proximal ICAs. 2. Significantly dominant left vertebral artery with redemonstrated moderate/severe ostial stenosis. The vertebral arteries are otherwise patent within the neck without significant stenosis. 3. Ill-defined airspace opacity within the posterior aspect of the partially imaged right upper lobe suspicious for pneumonia. CTA head: 1. No intracranial large vessel occlusion or proximal high-grade arterial stenosis identified. 2. Redemonstrated multifocal high-grade stenoses within distal left anterior cerebral artery branches. 3. Calcified atherosclerosis within the intracranial ICAs, but no more than mild stenosis. CT perfusion head: 1. The perfusion software detects no core infarct. 2. The perfusion software detects no region of critically hypoperfused parenchyma utilizing a Tmax>6 seconds threshold. Electronically Signed   By: Kellie Simmering DO   On:  11/25/2019 15:31   DG Chest Portable 1 View  Result Date: 12/19/2019 CLINICAL DATA:  84 year old male with weakness. EXAM: PORTABLE CHEST 1 VIEW COMPARISON:  Chest radiograph dated 11/14/2019. FINDINGS: Right upper lobe and lingular densities which although may represent atelectasis are concerning for developing infiltrate. Clinical correlation and follow-up to  resolution recommended. There is no pleural effusion or pneumothorax. The cardiac silhouette is within normal limits. Coronary vascular calcification and aortic valve repair noted. Atherosclerotic calcification of the aorta. No acute osseous pathology. IMPRESSION: Findings concerning for right upper lobe and lingular pneumonia. Clinical correlation and follow-up recommended. Electronically Signed   By: Anner Crete M.D.   On: 12/19/2019 17:16   Overnight EEG with video  Result Date: 11/27/2019 Lora Havens, MD     11/27/2019 11:23 AM Patient Name: Corey Oneill MRN: [id number] Epilepsy Attending: Lora Havens Referring Physician/Provider: Dr Kerney Elbe Duration: 11/26/2019 0939 to 11/27/2019 1043  Patient history: 84 y.o.malewith h/o "staring spells" presenting after acute onset of aphasia, right facial droop and right sided weakness at home. EEG to evaluate for seizure.  Level of alertness: awake, asleep  AEDs during EEG study: None  Technical aspects: This EEG study was done with scalp electrodes positioned according to the 10-20 International system of electrode placement. Electrical activity was acquired at a sampling rate of 500Hz  and reviewed with a high frequency filter of 70Hz  and a low frequency filter of 1Hz . EEG data were recorded continuously and digitally stored.  DESCRIPTION: No clear posterior dominant rhythm was seen. Sleep was characterized by vertex waves, sleep spindles (12-14Hz ), maximal frontocentral. EEG showed continuous generalized 3-5hz  theta-delta slowing as well as intermittent rhythmic generalized 2-3Hz   delta slowing.  Hyperventilation and photic stimulation were not performed.  ABNORMALITY - Continuous slow, generalized - Intermittent rhythmic slow, generalized  IMPRESSION: This study is suggestive of moderate diffuse encephalopathy, non specific to etiology.  No seizures or epileptiform discharges were seen throughout the recording.  Lora Havens   CT HEAD CODE STROKE WO CONTRAST  Result Date: 11/25/2019 CLINICAL DATA:  Code stroke.  Aphasia.  Right-sided weakness. EXAM: CT HEAD WITHOUT CONTRAST TECHNIQUE: Contiguous axial images were obtained from the base of the skull through the vertex without intravenous contrast. COMPARISON:  MRI 11/14/2019 FINDINGS: Brain: Generalized atrophy. Chronic small-vessel ischemic changes throughout the brain as seen previously. No CT evidence of acute infarction, mass lesion, hemorrhage, hydrocephalus or extra-axial collection. Vascular: There is atherosclerotic calcification of the major vessels at the base of the brain. Skull: Normal Sinuses/Orbits: Clear/normal Other: None ASPECTS ([place] Stroke Program Early CT Score) - Ganglionic level infarction (caudate, lentiform nuclei, internal capsule, insula, M1-M3 cortex): 7 - Supraganglionic infarction (M4-M6 cortex): 3 Total score (0-10 with 10 being normal): 10 IMPRESSION: 1. No acute finding by CT. Atrophy and chronic small-vessel ischemic changes as seen previously. 2. ASPECTS is 10 3. These results were communicated to Dr. Cheral Marker at 2:29 pmon 1/12/2021by text page via the [facility] messaging system. Electronically Signed   By: Nelson Chimes M.D.   On: 11/25/2019 14:30    Lab Data:  CBC: Recent Labs  Lab 12/16/19 1341 12/19/19 1601 12/20/19 0047  WBC 8.6 7.7 4.6  NEUTROABS 7.1* 6.1 3.2  HGB 11.1* 11.3* 10.0*  HCT 32.0* 32.1* 29.7*  MCV 94 94.1 95.8  PLT 69* PLATELET CLUMPS NOTED ON SMEAR, COUNT APPEARS DECREASED 81*   Basic Metabolic Panel: Recent Labs  Lab 12/16/19 1341 12/19/19 1601 12/20/19 0047   NA 135 131* 133*  K 4.2 4.0 3.2*  CL 96 95* 97*  CO2 25 24 23   GLUCOSE 90 93 110*  BUN 18 12 10   CREATININE 0.72* 0.75 0.69  CALCIUM 8.7 8.4* 8.1*   GFR: Estimated Creatinine Clearance: 61.4 mL/min (by C-G formula based on SCr of 0.69 mg/dL). Liver Function Tests:  Recent Labs  Lab 12/16/19 1341 12/19/19 1601  AST 13 17  ALT 6 10  ALKPHOS 96 74  BILITOT 0.4 0.8  PROT 6.0 6.4*  ALBUMIN 3.6 2.8*   No results for input(s): LIPASE, AMYLASE in the last 168 hours. No results for input(s): AMMONIA in the last 168 hours. Coagulation Profile: No results for input(s): INR, PROTIME in the last 168 hours. Cardiac Enzymes: No results for input(s): CKTOTAL, CKMB, CKMBINDEX, TROPONINI in the last 168 hours. BNP (last 3 results) No results for input(s): PROBNP in the last 8760 hours. HbA1C: No results for input(s): HGBA1C in the last 72 hours. CBG: No results for input(s): GLUCAP in the last 168 hours. Lipid Profile: No results for input(s): CHOL, HDL, LDLCALC, TRIG, CHOLHDL, LDLDIRECT in the last 72 hours. Thyroid Function Tests: No results for input(s): TSH, T4TOTAL, FREET4, T3FREE, THYROIDAB in the last 72 hours. Anemia Panel: No results for input(s): VITAMINB12, FOLATE, FERRITIN, TIBC, IRON, RETICCTPCT in the last 72 hours. Urine analysis:    Component Value Date/Time   COLORURINE STRAW (A) 12/20/2019 0136   APPEARANCEUR CLEAR 12/20/2019 0136   LABSPEC 1.005 12/20/2019 0136   PHURINE 6.0 12/20/2019 0136   GLUCOSEU NEGATIVE 12/20/2019 0136   HGBUR NEGATIVE 12/20/2019 0136   BILIRUBINUR NEGATIVE 12/20/2019 0136   BILIRUBINUR n 10/15/2015 0939   KETONESUR 20 (A) 12/20/2019 0136   PROTEINUR NEGATIVE 12/20/2019 0136   UROBILINOGEN 0.2 10/15/2015 0939   UROBILINOGEN 0.2 06/23/2015 1443   NITRITE NEGATIVE 12/20/2019 0136   LEUKOCYTESUR NEGATIVE 12/20/2019 0136     Benito Mccreedy M.D. [facility] 12/20/2019, 10:49 AM  Pager: [phone] Between 7am to 7pm - call  Pager - [phone]  After 7pm go to [email or web address] - password [email or web address]  Call night coverage person covering after 7pm

## 2019-12-20 NOTE — Progress Notes (Signed)
Pharmacy Antibiotic Note  Corey Oneill is a 84 y.o. male admitted on 12/19/2019 with aspiration pneumonia.  Pharmacy has been consulted for Unasyn dosing.  Plan: Unasyn 3g IV Q6H.  Height: 5\' 10"  (177.8 cm) Weight: 132 lb 0.9 oz (59.9 kg) IBW/kg (Calculated) : 73  Temp (24hrs), Avg:98.8 F (37.1 C), Min:98.1 F (36.7 C), Max:99.2 F (37.3 C)  Recent Labs  Lab 12/16/19 1341 12/19/19 1601 12/19/19 1602  WBC 8.6 7.7  --   CREATININE 0.72* 0.75  --   LATICACIDVEN  --   --  1.3    Estimated Creatinine Clearance: 61.4 mL/min (by C-G formula based on SCr of 0.75 mg/dL).    Allergies  Allergen Reactions  . Prednisone Other (See Comments)    Makes the patient depressed     Thank you for allowing pharmacy to be a part of this patient's care.  Wynona Neat, PharmD, BCPS  12/20/2019 12:33 AM

## 2019-12-20 NOTE — Evaluation (Signed)
Physical Therapy Evaluation Patient Details Name: Corey Oneill MRN: [id number] DOB: [birth year] Today's Date: 12/20/2019   History of Present Illness  84 y.o. 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 Found to have a nondisplaced fracture of the left inferior pubic ramus on CT of the pelvis as well as Right-sided aspiration pneumonia. Admitted 12/19/19 and is WBAT on LLE.    Clinical Impression  PTA pt living with wife in single story town home with 2 steps to enter. Pt reports independence in ambulation with RW and with ADLs. Pt requires assist for iADLs. Pt is currently limited in safe mobility by decreased safety awareness in presence of decreased strength and balance. Pt is supervision for bed mobility, min guard for transfers and minA for steadying and RW management with ambulation of 20 feet. PT recommending HHPT level rehab at discharge. PT will continue to follow acutely.     Follow Up Recommendations Home health PT;Supervision/Assistance - 24 hour    Equipment Recommendations  None recommended by PT    Recommendations for Other Services       Precautions / Restrictions Precautions Precautions: Fall Precaution Comments: hx of falling Restrictions Weight Bearing Restrictions: No      Mobility  Bed Mobility Overal bed mobility: Needs Assistance Bed Mobility: Supine to Sit     Supine to sit: Supervision     General bed mobility comments: increased time and effort  Transfers Overall transfer level: Needs assistance Equipment used: Rolling walker (2 wheeled) Transfers: Sit  to/from Stand Sit to Stand: Min guard         General transfer comment: min guard to steady for safety, cueing for hand placement and sequencing   Ambulation/Gait Ambulation/Gait assistance: Min assist Gait Distance (Feet): 20 Feet Assistive device: Rolling walker (2 wheeled) Gait Pattern/deviations: Step-through pattern;Decreased weight shift to left;Decreased stance time - left;Decreased stance time - right Gait velocity: slowed Gait velocity interpretation: <1.31 ft/sec, indicative of household ambulator General Gait Details: minA for steadying with mildly unsteady gait, no overt LoB, decreased weightshift to L       Balance Overall balance assessment: Needs assistance Sitting-balance support: No upper extremity supported;Feet supported Sitting balance-Leahy Scale: Fair     Standing balance support: Bilateral upper extremity supported;During functional activity Standing balance-Leahy Scale: Poor Standing balance comment: reliant on BUE and external support                             Pertinent Vitals/Pain Pain Assessment: Faces Faces Pain Scale: Hurts a little bit Pain Location: L hip  Pain Descriptors / Indicators: Discomfort;Grimacing;Guarding Pain Intervention(s): Monitored during session;Repositioned;Limited activity within patient's tolerance    Home Living Family/patient expects to be discharged to:: Private residence Living Arrangements: Spouse/significant other Available Help at Discharge: Family;Available 24 hours/day Type of Home: House(town house ) Home Access: Stairs to enter Entrance Stairs-Rails: Right Entrance Stairs-Number of Steps: 2 Home Layout: One level Home Equipment: Walker - 2 wheels;Cane - single point;Bedside commode      Prior Function Level of Independence: Needs assistance   Gait / Transfers Assistance Needed: uses walker without assist   ADL's / Homemaking Assistance Needed: pt states  he is independent ADL's; dependent  IADL's        Hand Dominance   Dominant Hand: Right    Extremity/Trunk Assessment   Upper Extremity Assessment Upper Extremity Assessment: Defer to OT evaluation    Lower Extremity Assessment Lower Extremity Assessment: LLE deficits/detail;RLE deficits/detail RLE Deficits / Details: L pelvic pain with R LE movement, ROM limited by pain, strength grossly 3+/5 RLE Coordination: decreased fine motor LLE Deficits / Details: ROM WFL, strength grossly 3+/5 LLE Coordination: decreased fine motor    Cervical / Trunk Assessment Cervical / Trunk Assessment: Kyphotic  Communication   Communication: HOH  Cognition Arousal/Alertness: Awake/alert Behavior During Therapy: WFL for tasks assessed/performed Overall Cognitive Status: History of cognitive impairments - at baseline Area of Impairment: Memory;Safety/judgement;Problem solving                     Memory: Decreased short-term memory;Decreased recall of precautions   Safety/Judgement: Decreased awareness of safety;Decreased awareness of deficits   Problem Solving: Slow processing;Requires verbal cues;Difficulty sequencing General Comments: pt with hx of dementia, follows commands with increased time and min cueing required for safety       General Comments General comments (skin integrity, edema, etc.): VSS on RA        Assessment/Plan    PT Assessment Patient needs continued PT services  PT Problem List Decreased balance;Decreased mobility;Decreased coordination;Decreased safety awareness;Decreased knowledge of precautions       PT Treatment Interventions DME instruction;Gait training;Stair training;Functional mobility training;Therapeutic activities;Therapeutic exercise;Balance training;Neuromuscular re-education;Patient/family education    PT Goals (Current goals can be found in the Care Plan section)  Acute Rehab PT Goals Patient Stated Goal: to get back home PT Goal Formulation: With patient Time For  Goal Achievement: 01/03/20 Potential to Achieve Goals: Good    Frequency Min 3X/week     Co-evaluation PT/OT/SLP Co-Evaluation/Treatment: Yes Reason for Co-Treatment: For patient/therapist safety PT goals addressed during session: Mobility/safety with mobility OT goals addressed during session: ADL's and self-care       AM-PAC PT "6 Clicks" Mobility  Outcome Measure Help needed turning from your back to your side while in a flat bed without using bedrails?: None Help needed moving from lying on your back to sitting on the side of a flat bed without using bedrails?: A Little Help needed moving to and from a bed to a chair (including a wheelchair)?: None Help needed standing up from a chair using your arms (e.g., wheelchair or bedside chair)?: None Help needed to walk in hospital room?: A Little Help needed climbing 3-5 steps with a railing? : A Lot 6 Click Score: 20    End of Session Equipment Utilized During Treatment: Gait belt Activity Tolerance: Patient tolerated treatment well Patient left: with call bell/phone within reach;in chair;with chair alarm set Nurse Communication: Mobility status PT Visit Diagnosis: Other abnormalities of gait and mobility (R26.89)    Time: [id number] PT Time Calculation (min) (ACUTE ONLY): 27 min   Charges:   PT Evaluation $PT Eval Moderate Complexity: 1 Mod          Ashiyah Pavlak B. Migdalia Dk PT, DPT Acute Rehabilitation Services Pager [phone] Office [phone]   [place] 12/20/2019, 2:52 PM

## 2019-12-21 ENCOUNTER — Encounter (HOSPITAL_COMMUNITY): Payer: Self-pay | Admitting: Internal Medicine

## 2019-12-21 DIAGNOSIS — S32301D Unspecified fracture of right ilium, subsequent encounter for fracture with routine healing: Secondary | ICD-10-CM

## 2019-12-21 LAB — CBC WITH DIFFERENTIAL/PLATELET
Abs Immature Granulocytes: 0.01 10*3/uL (ref 0.00–0.07)
Basophils Absolute: 0 10*3/uL (ref 0.0–0.1)
Basophils Relative: 1 %
Eosinophils Absolute: 0.1 10*3/uL (ref 0.0–0.5)
Eosinophils Relative: 2 %
HCT: 29.1 % — ABNORMAL LOW (ref 39.0–52.0)
Hemoglobin: 9.9 g/dL — ABNORMAL LOW (ref 13.0–17.0)
Immature Granulocytes: 0 %
Lymphocytes Relative: 25 %
Lymphs Abs: 1 10*3/uL (ref 0.7–4.0)
MCH: 32.6 pg (ref 26.0–34.0)
MCHC: 34 g/dL (ref 30.0–36.0)
MCV: 95.7 fL (ref 80.0–100.0)
Monocytes Absolute: 0.5 10*3/uL (ref 0.1–1.0)
Monocytes Relative: 13 %
Neutro Abs: 2.3 10*3/uL (ref 1.7–7.7)
Neutrophils Relative %: 59 %
Platelets: 87 10*3/uL — ABNORMAL LOW (ref 150–400)
RBC: 3.04 MIL/uL — ABNORMAL LOW (ref 4.22–5.81)
RDW: 13.9 % (ref 11.5–15.5)
WBC: 3.9 10*3/uL — ABNORMAL LOW (ref 4.0–10.5)
nRBC: 0 % (ref 0.0–0.2)

## 2019-12-21 LAB — COMPREHENSIVE METABOLIC PANEL
ALT: 9 U/L (ref 0–44)
AST: 15 U/L (ref 15–41)
Albumin: 2.4 g/dL — ABNORMAL LOW (ref 3.5–5.0)
Alkaline Phosphatase: 63 U/L (ref 38–126)
Anion gap: 8 (ref 5–15)
BUN: 13 mg/dL (ref 8–23)
CO2: 27 mmol/L (ref 22–32)
Calcium: 8.1 mg/dL — ABNORMAL LOW (ref 8.9–10.3)
Chloride: 100 mmol/L (ref 98–111)
Creatinine, Ser: 0.66 mg/dL (ref 0.61–1.24)
GFR calc Af Amer: >60 mL/min (ref >60)
GFR calc non Af Amer: >60 mL/min (ref >60)
Glucose, Bld: 98 mg/dL (ref 70–99)
Potassium: 4.1 mmol/L (ref 3.5–5.1)
Sodium: 135 mmol/L (ref 135–145)
Total Bilirubin: 0.8 mg/dL (ref 0.3–1.2)
Total Protein: 5.5 g/dL — ABNORMAL LOW (ref 6.5–8.1)

## 2019-12-21 LAB — URINE CULTURE: Culture: NO GROWTH

## 2019-12-21 NOTE — Plan of Care (Signed)
  Problem: Pain Managment: Goal: General experience of comfort will improve Outcome: Progressing   Problem: Safety: Goal: Ability to remain free from injury will improve Outcome: Progressing   Problem: Skin Integrity: Goal: Risk for impaired skin integrity will decrease Outcome: Progressing   

## 2019-12-21 NOTE — Progress Notes (Signed)
[facility]                                                                              Patient Demographics  Corey Oneill, is a 84 y.o. male, DOB - 1936/05/16, [id number]  Admit date - 12/19/2019   Admitting Physician Benito Mccreedy, MD  Outpatient Primary MD for the patient is [name], MD  Outpatient specialists:   LOS - 1  days    Chief Complaint  Patient presents with  . Weakness       Brief summary  Corey Oneill is a 84 y.o. 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 The ED patient's O2 sat was 90% on room air and imaging studies noted pneumonic infiltrates with nondisplaced fracture of the left inferior pubic ramus  Patient admitted for aspiration pneumonia and left inferior pubic ramus fracture Assessment & Plan    Active Problems:   Pelvic fracture (HCC)   Aspiration pneumonia (HCC)  Aspiration pneumonia -History of aspiration with outpatient speech pathology evaluation -Per the patient's goals of care he does not wish for strict dietary modification, and understands that this is likely to increase his risk of respiratory complications including pneumonia -History of cough without oxygen requirement no leukocytosis-chest x-ray and CT confirmed pneumonic infiltrate -High risk for multi drug resistant organism with history of chronic suppressive amoxicillin for recurrent enterococci UTI prophylactic and doxycycline for bullous pemphigoid -Amoxicillin was held and Unasyn started in admission with continuation of doxycycline  Pelvic fracture -Nondisplaced left inferior pubic ramus fracture -Following mechanical fall -Ortho  Dr. Doreatha Martin, recommends WBAT to LLE, with outpatient follow-up in 3-4 weeks  -Supportive care including pain control -PT OT  Hypokalemia Replete potassium  Moderate protein calorie malnutrition Nutritional support Nutritionist dietitian consulted    Metastatic prostate cancer -Continue Xtandi -CT of his pelvis noted sclerotic lesion in the superior aspect of the left acetabulum and right iliac bone- new since prior CT and concerning for metastatic disease.  -Outpatient follow-up with patient's oncologist on discharge           Code Status: DNR DVT Prophylaxis:  Lovenox  Family Communication: Discussed in detail with the patient, all imaging results, lab results explained to the patient  Disposition Plan: TBD  Time Spent in minutes 25 minutes  Procedures:   Consultants:   Orthopedist  Antimicrobials:      Medications  Scheduled Meds: . aspirin EC  81 mg Oral QPM  . carvedilol  3.125 mg Oral BID AC  . doxycycline  100 mg Oral BID  . DULoxetine  60 mg Oral Daily  . enoxaparin (LOVENOX) injection  40 mg Subcutaneous QHS  . enzalutamide  160 mg Oral Q supper  . feeding supplement (ENSURE ENLIVE)  237 mL Oral BID BM  . ferrous sulfate  325 mg Oral Q breakfast  . isosorbide mononitrate  30 mg Oral Daily  .  lamoTRIgine  50 mg Oral BID  . multivitamin with minerals  1 tablet Oral Daily  . nutrition supplement (JUVEN)  1 packet Oral BID BM  . rosuvastatin  10 mg Oral Daily  . cyanocobalamin  1,000 mcg Oral Daily   Continuous Infusions: . ampicillin-sulbactam (UNASYN) IV 1.5 g (12/21/19 0558)   PRN Meds:.acetaminophen **OR** acetaminophen, bisacodyl, ondansetron **OR** ondansetron (ZOFRAN) IV, polyethylene glycol, traMADol   Antibiotics   Anti-infectives (From admission, onward)   Start     Dose/Rate Route Frequency Ordered Stop   12/20/19 1200  ampicillin-sulbactam (UNASYN) 1.5 g in sodium chloride 0.9 % 100 mL IVPB     1.5 g 200 mL/hr over 30 Minutes  Intravenous Every 6 hours 12/20/19 1105     12/19/19 2215  doxycycline (VIBRA-TABS) tablet 100 mg     100 mg Oral 2 times daily 12/19/19 2205     12/19/19 1745  cefTRIAXone (ROCEPHIN) 1 g in sodium chloride 0.9 % 100 mL IVPB     1 g 200 mL/hr over 30 Minutes Intravenous  Once 12/19/19 1735 12/19/19 2039   12/19/19 1745  azithromycin (ZITHROMAX) 500 mg in sodium chloride 0.9 % 250 mL IVPB     500 mg 250 mL/hr over 60 Minutes Intravenous  Once 12/19/19 1735 12/19/19 2039        Subjective:   Corey Oneill was seen and examined today.  No fever or chills, sob better  Objective:   Vitals:   12/20/19 1222 12/20/19 1943 12/21/19 0321 12/21/19 0744  BP: (!) 104/58 (!) 156/72 (!) 147/73 (!) 147/65  Pulse: 68 68 73 66  Resp: 18   14  Temp: 97.9 F (36.6 C) 97.6 F (36.4 C) 97.8 F (36.6 C) 98 F (36.7 C)  TempSrc: Oral Oral Oral Oral  SpO2: 98% 99% 97% 98%  Weight:      Height:        Intake/Output Summary (Last 24 hours) at 12/21/2019 1159 Last data filed at 12/21/2019 0330 Gross per 24 hour  Intake 120 ml  Output 1500 ml  Net -1380 ml     Wt Readings from Last 3 Encounters:  12/20/19 59.9 kg  12/01/19 59.9 kg  11/24/19 63 kg     Exam  General: NAD  HEENT: NCAT,  PERRL,MMM  Neck: SUPPLE, (-) JVD  Cardiovascular: RRR, (-) GALLOP, (-) MURMUR  Respiratory: Right lower lung field rhonchi  Gastrointestinal: SOFT, (-) DISTENSION, BS(+), (_) TENDERNESS  Ext: (-) CYANOSIS, (-) EDEMA  Neuro: Alert and responsive, no gross acute focal deficit             Psych:NORMAL AFFECT/MOOD   Data Reviewed:  I have personally reviewed following labs and imaging studies  Micro Results Recent Results (from the past 240 hour(s))  Culture, blood (routine x 2)     Status: None (Preliminary result)   Collection Time: 12/19/19  6:16 PM   Specimen: BLOOD  Result Value Ref Range Status   Specimen Description BLOOD LEFT ANTECUBITAL  Final   Special Requests   Final    BOTTLES DRAWN  AEROBIC AND ANAEROBIC Blood Culture adequate volume   Culture   Final    NO GROWTH 2 DAYS Performed at [facility], [address], [place], [place] [address]    Report Status PENDING  Incomplete  Respiratory Panel by RT PCR (Flu A&B, Covid) - Nasopharyngeal Swab     Status: None   Collection Time: 12/19/19  6:21 PM   Specimen: Nasopharyngeal Swab  Result Value Ref Range Status   SARS Coronavirus 2 by RT PCR NEGATIVE NEGATIVE Final    Comment: (NOTE) SARS-CoV-2 target nucleic acids are NOT DETECTED. The SARS-CoV-2 RNA is generally detectable in upper respiratoy specimens during the acute phase of infection. The lowest concentration of SARS-CoV-2 viral copies this assay can detect is 131 copies/mL. A negative result does not preclude SARS-Cov-2 infection and should not be used as the sole basis for treatment or other patient management decisions. A negative result may occur with  improper specimen collection/handling, submission of specimen other than nasopharyngeal swab, presence of viral mutation(s) within the areas targeted by this assay, and inadequate number of viral copies (<131 copies/mL). A negative result must be combined with clinical observations, patient history, and epidemiological information. The expected result is Negative. Fact Sheet for Patients:  PinkCheek.be Fact Sheet for Healthcare Providers:  GravelBags.it This test is not yet ap proved or cleared by the [place] FDA and  has been authorized for detection and/or diagnosis of SARS-CoV-2 by FDA under an Emergency Use Authorization (EUA). This EUA will remain  in effect (meaning this test can be used) for the duration of the COVID-19 declaration under Section 564(b)(1) of the Act, 21 U.S.C. section 360bbb-3(b)(1), unless the authorization is terminated or revoked sooner.    Influenza A by PCR NEGATIVE NEGATIVE Final   Influenza B by PCR  NEGATIVE NEGATIVE Final    Comment: (NOTE) The Xpert Xpress SARS-CoV-2/FLU/RSV assay is intended as an aid in  the diagnosis of influenza from Nasopharyngeal swab specimens and  should not be used as a sole basis for treatment. Nasal washings and  aspirates are unacceptable for Xpert Xpress SARS-CoV-2/FLU/RSV  testing. Fact Sheet for Patients: PinkCheek.be Fact Sheet for Healthcare Providers: GravelBags.it This test is not yet approved or cleared by the [place] FDA and  has been authorized for detection and/or diagnosis of SARS-CoV-2 by  FDA under an Emergency Use Authorization (EUA). This EUA will remain  in effect (meaning this test can be used) for the duration of the  Covid-19 declaration under Section 564(b)(1) of the Act, 21  U.S.C. section 360bbb-3(b)(1), unless the authorization is  terminated or revoked. Performed at [facility], [place] [address], [place], [place] [address]   Culture, blood (routine x 2)     Status: None (Preliminary result)   Collection Time: 12/20/19 12:56 AM   Specimen: BLOOD RIGHT FOREARM  Result Value Ref Range Status   Specimen Description BLOOD RIGHT FOREARM  Final   Special Requests   Final    BOTTLES DRAWN AEROBIC AND ANAEROBIC Blood Culture adequate volume   Culture   Final    NO GROWTH 1 DAY Performed at [facility], [place] [address], [place], [place] [address]    Report Status PENDING  Incomplete    Radiology Reports EEG  Result Date: 11/25/2019 Corey Havens, MD     11/25/2019  5:42 PM Patient Name: DEAVION SUNADA MRN: [id number] Epilepsy Attending: Lora Oneill Referring Physician/Provider: Dr Kerney Elbe Date: 11/25/2019 Duration: 23.41 mins Patient history: 84 y.o. male with h/o "staring spells" presenting after acute onset of aphasia, right facial droop and right sided weakness at home. EEG to evaluate for seizure. Level of alertness: awake AEDs during EEG  study: None Technical aspects: This EEG study was done with scalp electrodes positioned according to the 10-20 International system of electrode placement. Electrical activity was acquired at a sampling rate of 500Hz  and reviewed with  a high frequency filter of 70Hz  and a low frequency filter of 1Hz . EEG data were recorded continuously and digitally stored. DESCRIPTION: No clear posterior dominant rhythm was seen. EEG showed continuous generalized 3-5hz  theta-delta slowing as well as intermittent rhythmic generalized 2-3Hz  delta slowing.  Hyperventilation and photic stimulation were not performed. ABNORMALITY - Continuous slow, generalized - Intermittent rhythmic slow, generalized IMPRESSION: This study is suggestive of moderate diffuse encephalopathy, non specific to etiology.  No seizures or epileptiform discharges were seen throughout the recording. Corey Oneill   CT Code Stroke CTA Head W/WO contrast  Result Date: 11/25/2019 CLINICAL DATA:  Neuro deficit, acute, stroke suspected. EXAM: CT ANGIOGRAPHY HEAD AND NECK CT PERFUSION BRAIN TECHNIQUE: Multidetector CT imaging of the head and neck was performed using the standard protocol during bolus administration of intravenous contrast. Multiplanar CT image reconstructions and MIPs were obtained to evaluate the vascular anatomy. Carotid stenosis measurements (when applicable) are obtained utilizing NASCET criteria, using the distal internal carotid diameter as the denominator. Multiphase CT imaging of the brain was performed following IV bolus contrast injection. Subsequent parametric perfusion maps were calculated using RAPID software. CONTRAST:  156mL OMNIPAQUE IOHEXOL 350 MG/ML SOLN COMPARISON:  Noncontrast head CT performed earlier the same day, CT angiogram head/neck 11/14/2019 FINDINGS: CTA NECK FINDINGS Aortic arch: Standard aortic branching. Scattered soft and calcified plaque within the visualized aortic arch and proximal major branch vessels of the  neck. Right carotid system: CCA widely patent to the bifurcation. Mild atherosclerotic plaque within the carotid bifurcation and proximal ICA. No measurable stenosis of the proximal ICA relative to the more distal vessel. Left carotid system: CCA patent to the bifurcation. Soft and calcified plaque within the carotid bifurcation and proximal ICA. No measurable stenosis of the proximal ICA as compared to the more distal vessel. Vertebral arteries: The left vertebral artery is significantly dominant. The non dominant right vertebral artery is patent throughout the neck without significant stenosis (50% or greater). Mixed plaque results in unchanged moderate/severe ostial stenosis at the origin of the dominant left vertebral artery. Distal to this, the left vertebral artery is patent within the neck without significant stenosis (50% or greater). Skeleton: No acute bony abnormality. Cervical spondylosis with multilevel shallow posterior disc osteophytes, uncovertebral and facet hypertrophy. No high-grade bony spinal canal stenosis. Other neck: No neck mass or cervical lymphadenopathy. Subcentimeter right thyroid lobe nodule, not meeting consensus criteria for ultrasound follow-up. Upper chest: Ill-defined opacity within the posterior aspect of the partially visualized right upper lobe suspicious for pneumonia. Review of the MIP images confirms the above findings CTA HEAD FINDINGS Anterior circulation: The intracranial internal carotid arteries are patent bilaterally with scattered calcified plaque but no more than mild stenosis. The right middle and anterior cerebral arteries are patent without proximal branch occlusion or high-grade proximal stenosis. The left middle cerebral artery is patent without proximal branch occlusion or high-grade proximal arterial stenosis identified. The left anterior cerebral artery is patent without significant proximal stenosis. Redemonstrated multifocal high-grade stenoses within distal  left anterior cerebral artery branches (for instance as seen on series 6, image 58) (series 11, image 23). No intracranial aneurysm is identified. Posterior circulation: The non dominant intracranial right vertebral artery is patent and appears to terminate as the right PICA. The dominant intracranial left vertebral artery is patent without significant stenosis, as is the basilar artery. The bilateral posterior cerebral arteries are patent without significant proximal stenosis. Posterior communicating arteries are poorly delineated and may be hypoplastic or absent bilaterally. Venous sinuses:  Within limitations of contrast timing, no convincing thrombus. Anatomic variants: As described Review of the MIP images confirms the above findings CT Brain Perfusion Findings: CBF (<30%) Volume: None Perfusion (Tmax>6.0s) volume: None Mismatch Volume: None Infarction Location: None These results were communicated to Dr. Cheral Marker At 3:33 pmon 1/12/2021by text page via the [facility] messaging system. IMPRESSION: CTA neck: 1. The bilateral common and internal carotid arteries are patent within the neck without significant stenosis. Mild plaque at the carotid bifurcations and within the proximal ICAs. 2. Significantly dominant left vertebral artery with redemonstrated moderate/severe ostial stenosis. The vertebral arteries are otherwise patent within the neck without significant stenosis. 3. Ill-defined airspace opacity within the posterior aspect of the partially imaged right upper lobe suspicious for pneumonia. CTA head: 1. No intracranial large vessel occlusion or proximal high-grade arterial stenosis identified. 2. Redemonstrated multifocal high-grade stenoses within distal left anterior cerebral artery branches. 3. Calcified atherosclerosis within the intracranial ICAs, but no more than mild stenosis. CT perfusion head: 1. The perfusion software detects no core infarct. 2. The perfusion software detects no region of critically  hypoperfused parenchyma utilizing a Tmax>6 seconds threshold. Electronically Signed   By: Kellie Simmering DO   On: 11/25/2019 15:31   CT Head Wo Contrast  Result Date: 12/19/2019 CLINICAL DATA:  Neuro deficits, subacute. EXAM: CT HEAD WITHOUT CONTRAST TECHNIQUE: Contiguous axial images were obtained from the base of the skull through the vertex without intravenous contrast. COMPARISON:  November 25, 2019 FINDINGS: Brain: There is moderate severity cerebral atrophy with widening of the extra-axial spaces and ventricular dilatation. There are areas of decreased attenuation within the white matter tracts of the supratentorial brain, consistent with microvascular disease changes. Vascular: No hyperdense vessel or unexpected calcification. Skull: Normal. Negative for fracture or focal lesion. Sinuses/Orbits: No acute finding. Other: None. IMPRESSION: 1. No acute intracranial pathology. 2. Brain atrophy and microvascular disease changes within the white matter tracts of the supratentorial brain. Electronically Signed   By: Virgina Norfolk M.D.   On: 12/19/2019 18:24   CT Code Stroke CTA Neck W/WO contrast  Result Date: 11/25/2019 CLINICAL DATA:  Neuro deficit, acute, stroke suspected. EXAM: CT ANGIOGRAPHY HEAD AND NECK CT PERFUSION BRAIN TECHNIQUE: Multidetector CT imaging of the head and neck was performed using the standard protocol during bolus administration of intravenous contrast. Multiplanar CT image reconstructions and MIPs were obtained to evaluate the vascular anatomy. Carotid stenosis measurements (when applicable) are obtained utilizing NASCET criteria, using the distal internal carotid diameter as the denominator. Multiphase CT imaging of the brain was performed following IV bolus contrast injection. Subsequent parametric perfusion maps were calculated using RAPID software. CONTRAST:  151mL OMNIPAQUE IOHEXOL 350 MG/ML SOLN COMPARISON:  Noncontrast head CT performed earlier the same day, CT angiogram  head/neck 11/14/2019 FINDINGS: CTA NECK FINDINGS Aortic arch: Standard aortic branching. Scattered soft and calcified plaque within the visualized aortic arch and proximal major branch vessels of the neck. Right carotid system: CCA widely patent to the bifurcation. Mild atherosclerotic plaque within the carotid bifurcation and proximal ICA. No measurable stenosis of the proximal ICA relative to the more distal vessel. Left carotid system: CCA patent to the bifurcation. Soft and calcified plaque within the carotid bifurcation and proximal ICA. No measurable stenosis of the proximal ICA as compared to the more distal vessel. Vertebral arteries: The left vertebral artery is significantly dominant. The non dominant right vertebral artery is patent throughout the neck without significant stenosis (50% or greater). Mixed plaque results in unchanged moderate/severe ostial  stenosis at the origin of the dominant left vertebral artery. Distal to this, the left vertebral artery is patent within the neck without significant stenosis (50% or greater). Skeleton: No acute bony abnormality. Cervical spondylosis with multilevel shallow posterior disc osteophytes, uncovertebral and facet hypertrophy. No high-grade bony spinal canal stenosis. Other neck: No neck mass or cervical lymphadenopathy. Subcentimeter right thyroid lobe nodule, not meeting consensus criteria for ultrasound follow-up. Upper chest: Ill-defined opacity within the posterior aspect of the partially visualized right upper lobe suspicious for pneumonia. Review of the MIP images confirms the above findings CTA HEAD FINDINGS Anterior circulation: The intracranial internal carotid arteries are patent bilaterally with scattered calcified plaque but no more than mild stenosis. The right middle and anterior cerebral arteries are patent without proximal branch occlusion or high-grade proximal stenosis. The left middle cerebral artery is patent without proximal branch  occlusion or high-grade proximal arterial stenosis identified. The left anterior cerebral artery is patent without significant proximal stenosis. Redemonstrated multifocal high-grade stenoses within distal left anterior cerebral artery branches (for instance as seen on series 6, image 58) (series 11, image 23). No intracranial aneurysm is identified. Posterior circulation: The non dominant intracranial right vertebral artery is patent and appears to terminate as the right PICA. The dominant intracranial left vertebral artery is patent without significant stenosis, as is the basilar artery. The bilateral posterior cerebral arteries are patent without significant proximal stenosis. Posterior communicating arteries are poorly delineated and may be hypoplastic or absent bilaterally. Venous sinuses: Within limitations of contrast timing, no convincing thrombus. Anatomic variants: As described Review of the MIP images confirms the above findings CT Brain Perfusion Findings: CBF (<30%) Volume: None Perfusion (Tmax>6.0s) volume: None Mismatch Volume: None Infarction Location: None These results were communicated to Dr. Cheral Marker At 3:33 pmon 1/12/2021by text page via the [facility] messaging system. IMPRESSION: CTA neck: 1. The bilateral common and internal carotid arteries are patent within the neck without significant stenosis. Mild plaque at the carotid bifurcations and within the proximal ICAs. 2. Significantly dominant left vertebral artery with redemonstrated moderate/severe ostial stenosis. The vertebral arteries are otherwise patent within the neck without significant stenosis. 3. Ill-defined airspace opacity within the posterior aspect of the partially imaged right upper lobe suspicious for pneumonia. CTA head: 1. No intracranial large vessel occlusion or proximal high-grade arterial stenosis identified. 2. Redemonstrated multifocal high-grade stenoses within distal left anterior cerebral artery branches. 3. Calcified  atherosclerosis within the intracranial ICAs, but no more than mild stenosis. CT perfusion head: 1. The perfusion software detects no core infarct. 2. The perfusion software detects no region of critically hypoperfused parenchyma utilizing a Tmax>6 seconds threshold. Electronically Signed   By: Kellie Simmering DO   On: 11/25/2019 15:31   CT Chest Wo Contrast  Result Date: 12/19/2019 CLINICAL DATA:  Evaluate for pneumonia. EXAM: CT CHEST WITHOUT CONTRAST TECHNIQUE: Multidetector CT imaging of the chest was performed following the standard protocol without IV contrast. COMPARISON:  None. FINDINGS: Cardiovascular: There is mild to moderate severity calcification of the aortic arch. An artificial aortic valve is seen. Normal heart size. No pericardial effusion. Marked severity coronary artery calcification is seen. Mediastinum/Nodes: A cluster of subcentimeter calcified lymph nodes is seen along the left hilum. Lungs/Pleura: Mild to moderate severity patchy infiltrate is seen within the posterolateral aspect of the right upper lobe and posterior aspect of the right lower lobe. Mild areas of bibasilar linear atelectasis are seen. There is no evidence of a pleural effusion or pneumothorax. Upper Abdomen: Noninflamed diverticula  are seen along the splenic flexure. Musculoskeletal: Multilevel degenerative changes seen throughout the thoracic spine. IMPRESSION: Mild-to-moderate severity patchy right upper lobe and right lower lobe infiltrates. Aortic Atherosclerosis (ICD10-I70.0). Electronically Signed   By: Virgina Norfolk M.D.   On: 12/19/2019 18:56   CT Lumbar Spine Wo Contrast  Result Date: 12/19/2019 CLINICAL DATA:  Low back pain. EXAM: CT LUMBAR SPINE WITHOUT CONTRAST TECHNIQUE: Multidetector CT imaging of the lumbar spine was performed without intravenous contrast administration. Multiplanar CT image reconstructions were also generated. COMPARISON:  None. FINDINGS: Segmentation: 5 lumbar type vertebrae.  Alignment: There is approximately 4 mm retrolisthesis of the L5 vertebral body on S1. Vertebrae: No acute fracture. A 6 mm sclerotic focus is seen within the lateral aspect of the L4 vertebral body on the left. Paraspinal and other soft tissues: There is marked severity calcification of the abdominal aorta. Disc levels: L1-2: Mild to moderate endplates sclerosis. Mild intervertebral disc space narrowing. Bilateral facet joint hypertrophy. Normal central canal and bilateral lateral recesses. Narrowing of the bilateral intervertebral neural foramina. L2-3: Mild-to-moderate endplates sclerosis. Mild intervertebral disc space narrowing. Bilateral facet joint hypertrophy. Normal central canal and bilateral lateral recesses. Narrowing of the bilateral intervertebral neural foramina. L3-4: Mild to moderate endplates sclerosis. Mild intervertebral disc space narrowing. Bilateral facet joint hypertrophy. Normal central canal and bilateral lateral recesses. Narrowing of the bilateral intervertebral neural foramina. L4-5: Mild-to-moderate endplates sclerosis. Mild intervertebral disc space narrowing. Bilateral facet joint hypertrophy. Normal central canal and bilateral lateral recesses. Narrowing of the bilateral intervertebral neural foramina. L5-S1: Moderate-severity endplates sclerosis. Marked severity intervertebral disc space narrowing. Bilateral facet joint hypertrophy. Normal central canal and bilateral lateral recesses. Narrowing of the bilateral intervertebral neural foramina. IMPRESSION: 1. No acute osseous abnormality. 2. Multilevel degenerative changes, most prominent at the level of L5-S1. 3. Approximately 4 mm retrolisthesis of the L5 vertebral body on S1. Electronically Signed   By: Virgina Norfolk M.D.   On: 12/19/2019 18:40   CT PELVIS WO CONTRAST  Result Date: 12/19/2019 CLINICAL DATA:  84 year old male with pelvic trauma. EXAM: CT PELVIS WITHOUT CONTRAST TECHNIQUE: Multidetector CT imaging of the pelvis  was performed following the standard protocol without intravenous contrast. COMPARISON:  CT of the abdomen pelvis dated 01/25/2016. FINDINGS: Evaluation of this exam is limited in the absence of intravenous contrast. Evaluation is also limited due to respiratory motion artifact. Urinary Tract: The visualized ureters and urinary bladder appear unremarkable. Bowel:  Unremarkable visualized pelvic bowel loops. Vascular/Lymphatic: Advanced aortoiliac atherosclerotic disease. Reproductive:  Prostate fiducial markers noted. Other:  None Musculoskeletal: There is a nondisplaced fracture of the left inferior pubic ramus. Linear cortical lucency in the left ischial tuberosity (axial series 3, image 108 and coronal series 6 images 68 and 72) may represent vascular groove and less likely a nondisplaced fracture. No other acute fracture identified. The bones are osteopenic. There is no dislocation. There is a patchy area of sclerotic changes in the superior left acetabulum as well as a subcentimeter sclerotic focus in the posterior right iliac bone most concerning for metastatic disease, likely from prostate cancer. Clinical correlation is recommended. Further evaluation with MRI or bone scan recommended. IMPRESSION: 1. Nondisplaced fracture of the left inferior pubic ramus. 2. Sclerotic lesion in the superior aspect of the left acetabulum and right iliac bone new since the prior CT and most concerning for metastatic disease. Further evaluation with MRI or bone scan recommended. Electronically Signed   By: Anner Crete M.D.   On: 12/19/2019 18:55   CT  Code Stroke Cerebral Perfusion with contrast  Result Date: 11/25/2019 CLINICAL DATA:  Neuro deficit, acute, stroke suspected. EXAM: CT ANGIOGRAPHY HEAD AND NECK CT PERFUSION BRAIN TECHNIQUE: Multidetector CT imaging of the head and neck was performed using the standard protocol during bolus administration of intravenous contrast. Multiplanar CT image reconstructions and  MIPs were obtained to evaluate the vascular anatomy. Carotid stenosis measurements (when applicable) are obtained utilizing NASCET criteria, using the distal internal carotid diameter as the denominator. Multiphase CT imaging of the brain was performed following IV bolus contrast injection. Subsequent parametric perfusion maps were calculated using RAPID software. CONTRAST:  120mL OMNIPAQUE IOHEXOL 350 MG/ML SOLN COMPARISON:  Noncontrast head CT performed earlier the same day, CT angiogram head/neck 11/14/2019 FINDINGS: CTA NECK FINDINGS Aortic arch: Standard aortic branching. Scattered soft and calcified plaque within the visualized aortic arch and proximal major branch vessels of the neck. Right carotid system: CCA widely patent to the bifurcation. Mild atherosclerotic plaque within the carotid bifurcation and proximal ICA. No measurable stenosis of the proximal ICA relative to the more distal vessel. Left carotid system: CCA patent to the bifurcation. Soft and calcified plaque within the carotid bifurcation and proximal ICA. No measurable stenosis of the proximal ICA as compared to the more distal vessel. Vertebral arteries: The left vertebral artery is significantly dominant. The non dominant right vertebral artery is patent throughout the neck without significant stenosis (50% or greater). Mixed plaque results in unchanged moderate/severe ostial stenosis at the origin of the dominant left vertebral artery. Distal to this, the left vertebral artery is patent within the neck without significant stenosis (50% or greater). Skeleton: No acute bony abnormality. Cervical spondylosis with multilevel shallow posterior disc osteophytes, uncovertebral and facet hypertrophy. No high-grade bony spinal canal stenosis. Other neck: No neck mass or cervical lymphadenopathy. Subcentimeter right thyroid lobe nodule, not meeting consensus criteria for ultrasound follow-up. Upper chest: Ill-defined opacity within the posterior  aspect of the partially visualized right upper lobe suspicious for pneumonia. Review of the MIP images confirms the above findings CTA HEAD FINDINGS Anterior circulation: The intracranial internal carotid arteries are patent bilaterally with scattered calcified plaque but no more than mild stenosis. The right middle and anterior cerebral arteries are patent without proximal branch occlusion or high-grade proximal stenosis. The left middle cerebral artery is patent without proximal branch occlusion or high-grade proximal arterial stenosis identified. The left anterior cerebral artery is patent without significant proximal stenosis. Redemonstrated multifocal high-grade stenoses within distal left anterior cerebral artery branches (for instance as seen on series 6, image 58) (series 11, image 23). No intracranial aneurysm is identified. Posterior circulation: The non dominant intracranial right vertebral artery is patent and appears to terminate as the right PICA. The dominant intracranial left vertebral artery is patent without significant stenosis, as is the basilar artery. The bilateral posterior cerebral arteries are patent without significant proximal stenosis. Posterior communicating arteries are poorly delineated and may be hypoplastic or absent bilaterally. Venous sinuses: Within limitations of contrast timing, no convincing thrombus. Anatomic variants: As described Review of the MIP images confirms the above findings CT Brain Perfusion Findings: CBF (<30%) Volume: None Perfusion (Tmax>6.0s) volume: None Mismatch Volume: None Infarction Location: None These results were communicated to Dr. Cheral Marker At 3:33 pmon 1/12/2021by text page via the [facility] messaging system. IMPRESSION: CTA neck: 1. The bilateral common and internal carotid arteries are patent within the neck without significant stenosis. Mild plaque at the carotid bifurcations and within the proximal ICAs. 2. Significantly dominant left vertebral artery  with redemonstrated moderate/severe ostial stenosis. The vertebral arteries are otherwise patent within the neck without significant stenosis. 3. Ill-defined airspace opacity within the posterior aspect of the partially imaged right upper lobe suspicious for pneumonia. CTA head: 1. No intracranial large vessel occlusion or proximal high-grade arterial stenosis identified. 2. Redemonstrated multifocal high-grade stenoses within distal left anterior cerebral artery branches. 3. Calcified atherosclerosis within the intracranial ICAs, but no more than mild stenosis. CT perfusion head: 1. The perfusion software detects no core infarct. 2. The perfusion software detects no region of critically hypoperfused parenchyma utilizing a Tmax>6 seconds threshold. Electronically Signed   By: Kellie Simmering DO   On: 11/25/2019 15:31   DG Chest Portable 1 View  Result Date: 12/19/2019 CLINICAL DATA:  84 year old male with weakness. EXAM: PORTABLE CHEST 1 VIEW COMPARISON:  Chest radiograph dated 11/14/2019. FINDINGS: Right upper lobe and lingular densities which although may represent atelectasis are concerning for developing infiltrate. Clinical correlation and follow-up to resolution recommended. There is no pleural effusion or pneumothorax. The cardiac silhouette is within normal limits. Coronary vascular calcification and aortic valve repair noted. Atherosclerotic calcification of the aorta. No acute osseous pathology. IMPRESSION: Findings concerning for right upper lobe and lingular pneumonia. Clinical correlation and follow-up recommended. Electronically Signed   By: Anner Crete M.D.   On: 12/19/2019 17:16   Overnight EEG with video  Result Date: 11/27/2019 Corey Havens, MD     11/27/2019 11:23 AM Patient Name: Corey Oneill MRN: [id number] Epilepsy Attending: Lora Oneill Referring Physician/Provider: Dr Kerney Elbe Duration: 11/26/2019 0939 to 11/27/2019 1043  Patient history: 84 y.o.malewith h/o "staring  spells" presenting after acute onset of aphasia, right facial droop and right sided weakness at home. EEG to evaluate for seizure.  Level of alertness: awake, asleep  AEDs during EEG study: None  Technical aspects: This EEG study was done with scalp electrodes positioned according to the 10-20 International system of electrode placement. Electrical activity was acquired at a sampling rate of 500Hz  and reviewed with a high frequency filter of 70Hz  and a low frequency filter of 1Hz . EEG data were recorded continuously and digitally stored.  DESCRIPTION: No clear posterior dominant rhythm was seen. Sleep was characterized by vertex waves, sleep spindles (12-14Hz ), maximal frontocentral. EEG showed continuous generalized 3-5hz  theta-delta slowing as well as intermittent rhythmic generalized 2-3Hz  delta slowing.  Hyperventilation and photic stimulation were not performed.  ABNORMALITY - Continuous slow, generalized - Intermittent rhythmic slow, generalized  IMPRESSION: This study is suggestive of moderate diffuse encephalopathy, non specific to etiology.  No seizures or epileptiform discharges were seen throughout the recording.  Corey Oneill   CT HEAD CODE STROKE WO CONTRAST  Result Date: 11/25/2019 CLINICAL DATA:  Code stroke.  Aphasia.  Right-sided weakness. EXAM: CT HEAD WITHOUT CONTRAST TECHNIQUE: Contiguous axial images were obtained from the base of the skull through the vertex without intravenous contrast. COMPARISON:  MRI 11/14/2019 FINDINGS: Brain: Generalized atrophy. Chronic small-vessel ischemic changes throughout the brain as seen previously. No CT evidence of acute infarction, mass lesion, hemorrhage, hydrocephalus or extra-axial collection. Vascular: There is atherosclerotic calcification of the major vessels at the base of the brain. Skull: Normal Sinuses/Orbits: Clear/normal Other: None ASPECTS (Heron Stroke Program Early CT Score) - Ganglionic level infarction (caudate, lentiform  nuclei, internal capsule, insula, M1-M3 cortex): 7 - Supraganglionic infarction (M4-M6 cortex): 3 Total score (0-10 with 10 being normal): 10 IMPRESSION: 1. No acute finding by CT. Atrophy and chronic small-vessel ischemic changes as seen previously.  2. ASPECTS is 10 3. These results were communicated to Dr. Cheral Marker at 2:29 pmon 1/12/2021by text page via the [facility] messaging system. Electronically Signed   By: Nelson Chimes M.D.   On: 11/25/2019 14:30    Lab Data:  CBC: Recent Labs  Lab 12/16/19 1341 12/19/19 1601 12/20/19 0047 12/21/19 0407  WBC 8.6 7.7 4.6 3.9*  NEUTROABS 7.1* 6.1 3.2 2.3  HGB 11.1* 11.3* 10.0* 9.9*  HCT 32.0* 32.1* 29.7* 29.1*  MCV 94 94.1 95.8 95.7  PLT 69* PLATELET CLUMPS NOTED ON SMEAR, COUNT APPEARS DECREASED 81* 87*   Basic Metabolic Panel: Recent Labs  Lab 12/16/19 1341 12/19/19 1601 12/20/19 0047 12/21/19 0407  NA 135 131* 133* 135  K 4.2 4.0 3.2* 4.1  CL 96 95* 97* 100  CO2 25 24 23 27   GLUCOSE 90 93 110* 98  BUN 18 12 10 13   CREATININE 0.72* 0.75 0.69 0.66  CALCIUM 8.7 8.4* 8.1* 8.1*   GFR: Estimated Creatinine Clearance: 61.4 mL/min (by C-G formula based on SCr of 0.66 mg/dL). Liver Function Tests: Recent Labs  Lab 12/16/19 1341 12/19/19 1601 12/21/19 0407  AST 13 17 15   ALT 6 10 9   ALKPHOS 96 74 63  BILITOT 0.4 0.8 0.8  PROT 6.0 6.4* 5.5*  ALBUMIN 3.6 2.8* 2.4*   No results for input(s): LIPASE, AMYLASE in the last 168 hours. No results for input(s): AMMONIA in the last 168 hours. Coagulation Profile: No results for input(s): INR, PROTIME in the last 168 hours. Cardiac Enzymes: No results for input(s): CKTOTAL, CKMB, CKMBINDEX, TROPONINI in the last 168 hours. BNP (last 3 results) No results for input(s): PROBNP in the last 8760 hours. HbA1C: No results for input(s): HGBA1C in the last 72 hours. CBG: No results for input(s): GLUCAP in the last 168 hours. Lipid Profile: No results for input(s): CHOL, HDL, LDLCALC, TRIG,  CHOLHDL, LDLDIRECT in the last 72 hours. Thyroid Function Tests: No results for input(s): TSH, T4TOTAL, FREET4, T3FREE, THYROIDAB in the last 72 hours. Anemia Panel: No results for input(s): VITAMINB12, FOLATE, FERRITIN, TIBC, IRON, RETICCTPCT in the last 72 hours. Urine analysis:    Component Value Date/Time   COLORURINE STRAW (A) 12/20/2019 0136   APPEARANCEUR CLEAR 12/20/2019 0136   LABSPEC 1.005 12/20/2019 0136   PHURINE 6.0 12/20/2019 0136   GLUCOSEU NEGATIVE 12/20/2019 0136   HGBUR NEGATIVE 12/20/2019 0136   BILIRUBINUR NEGATIVE 12/20/2019 0136   BILIRUBINUR n 10/15/2015 0939   KETONESUR 20 (A) 12/20/2019 0136   PROTEINUR NEGATIVE 12/20/2019 0136   UROBILINOGEN 0.2 10/15/2015 0939   UROBILINOGEN 0.2 06/23/2015 1443   NITRITE NEGATIVE 12/20/2019 0136   LEUKOCYTESUR NEGATIVE 12/20/2019 0136     Benito Mccreedy M.D. [facility] 12/21/2019, 11:59 AM  Pager: [phone] Between 7am to 7pm - call Pager - [phone]  After 7pm go to [email or web address] - password [email or web address]  Call night coverage person covering after 7pm

## 2019-12-21 NOTE — Evaluation (Signed)
Clinical/Bedside Swallow Evaluation Patient Details  Name: EDRIEL SOM MRN: [id number] Date of Birth: [birth year]  Today's Date: 12/21/2019 Time: SLP Start Time (ACUTE ONLY): [id number] SLP Stop Time (ACUTE ONLY): 1452 SLP Time Calculation (min) (ACUTE ONLY): 17 min  Past Medical History:  Past Medical History:  Diagnosis Date  . Abdominal aortic atherosclerosis ([place])   . Anginal pain ([place])    last noted 08/17/19  . Bone cancer ([place])    Left hip  . BPPV (benign paroxysmal positional vertigo)   . Bullous pemphigoid   . CAD (coronary artery disease)    a.  LHC 8/16: Mid to distal LAD 30%, OM1 40%, proximal mid RCA 40%, distal RCA 60% >> FFR 0.69  >> PCI: 3 x 15 mm Resolute DES  . Depression   . Esophageal reflux   . Grade I diastolic dysfunction 123456   Noted on ECHO   . H/O blood clots    "get them in my stool and urine" (11/12/2015)  . Heart murmur   . Hepatic lesion 12/08/2015   Stable 8 mm right hepatic lesion  . History of aortic stenosis    a. peak to peak gradient by LHC 8/16:  37 mmHg (moderately severe)   . History of appendicitis 2016  . History of blood transfusion 12/2018  . History of herpes labialis   . History of ITP    2018, thrombocytopenia  . History of kidney stones   . Hx of radiation therapy 04/14/13-06/09/13   prostate 7800 cGy, 40 sessions, seminal vesicles 5600 cGy 40 sessions  . Hydrocele 2006   Small  . Hyperlipidemia   . Insomnia    resolved  . LVH (left ventricular hypertrophy)    Moderate, noted on ECHO 09/2018  . Malignant melanoma in situ ([place]) 09/04/2016   Right neck and chest  . Metastasis from malignant neoplasm of prostate ([place]) 02/25/2019  . Prostate cancer ([place]) dx'd 2014  . Radiation proctitis   . Renal mass 2017   Bilateral renal masses  . S/P skin biopsy 04/09/2019   subepidermal cell poor vesicle , Linear IGG and C3 the basement membrane  . Suprapubic catheter ([place])   . Wears hearing aid in both ears    Past Surgical History:   Past Surgical History:  Procedure Laterality Date  . CARDIAC CATHETERIZATION N/A 04/21/2015   Procedure: Right/Left Heart Cath and Coronary Angiography;  Surgeon: [name], MD;  Location: [facility];  Service: Cardiovascular;  Laterality: N/A;  . CARDIAC CATHETERIZATION N/A 06/18/2015   Procedure: Intravascular Pressure Wire/FFR Study;  Surgeon: [name], MD;  Location: [facility];  Service: Cardiovascular;  Laterality: N/A;  . CARDIAC CATHETERIZATION N/A 06/18/2015   Procedure: Coronary Stent Intervention;  Surgeon: [name], MD;  Location: [facility];  Service: Cardiovascular;  Laterality: N/A;  . CARDIAC CATHETERIZATION N/A 06/18/2015   Procedure: Right/Left Heart Cath and Coronary Angiography;  Surgeon: [name], MD;  Location: [facility];  Service: Cardiovascular;  Laterality: N/A;  . CARDIAC CATHETERIZATION N/A 06/23/2015   Procedure: Left Heart Cath and Cors/Grafts Angiography;  Surgeon: [name], MD;  Location: [facility];  Service: Cardiovascular;  Laterality: N/A;  . CARDIAC CATHETERIZATION N/A 01/17/2016   Procedure: Right/Left Heart Cath and Coronary Angiography;  Surgeon: [name], MD;  Location: [facility];  Service: Cardiovascular;  Laterality: N/A;  . CATARACT EXTRACTION, BILATERAL    . COLONOSCOPY  06/26/2017  .  CYSTOSCOPY WITH BIOPSY N/A 07/30/2018   Procedure: CYSTOSCOPY WITH BIOPSY/FULGURATION, CYSTOLTHOLAPAXY;  Surgeon: [name], MD;  Location: [facility];  Service: Urology;  Laterality: N/A;  . CYSTOSCOPY WITH URETHRAL DILATATION N/A 08/18/2019   Procedure: CYSTOSCOPY WITH URETHRAL DILATATION USING BALLOON OR LASER/ SPURO PUBIC TUBE CHANGE LASER EXCISION OF URETHRAL STRICTURE RETROGRADE URETHROGRAM ANTEGRADE CYSTOGRAM;  Surgeon: [name], MD;  Location: [facility];  Service: Urology;  Laterality: N/A;  . FRACTURE SURGERY    . INGUINAL HERNIA REPAIR     patient does not  remember this procedure  . LAPAROSCOPIC APPENDECTOMY N/A 11/16/2015   Procedure: APPENDECTOMY LAPAROSCOPIC;  Surgeon: [name], MD;  Location: [facility];  Service: General;  Laterality: N/A;  . LEFT HEART CATH AND CORONARY ANGIOGRAPHY N/A 12/26/2016   Procedure: Left Heart Cath and Coronary Angiography;  Surgeon: Troy Sine, MD;  Location: [facility];  Service: Cardiovascular;  Laterality: N/A;  . MELANOMA EXCISION Right 10/24/2016   Procedure: WIDE EXCISION MELANOMA RIGHT NECK AND RIGHT CHEST TIMES 2;  Surgeon: [name], MD;  Location: [facility];  Service: General;  Laterality: Right;  right medial and lateral lesion of chest and right neck  . NASAL FRACTURE SURGERY     "broken years ago; several ORs to correct it"  . NASAL SEPTUM SURGERY    . PROSTATE BIOPSY  2014   "needle biopsy"  . TEE WITHOUT CARDIOVERSION N/A 02/15/2016   Procedure: TRANSESOPHAGEAL ECHOCARDIOGRAM (TEE);  Surgeon: [name], MD;  Location: [facility];  Service: Open Heart Surgery;  Laterality: N/A;  . TEE WITHOUT CARDIOVERSION N/A 08/26/2019   Procedure: TRANSESOPHAGEAL ECHOCARDIOGRAM (TEE);  Surgeon: Pixie Casino, MD;  Location: [facility];  Service: Cardiovascular;  Laterality: N/A;  . TONSILLECTOMY    . TRANSCATHETER AORTIC VALVE REPLACEMENT, TRANSFEMORAL  02/15/2016  . TRANSCATHETER AORTIC VALVE REPLACEMENT, TRANSFEMORAL N/A 02/15/2016   Procedure: TRANSCATHETER AORTIC VALVE REPLACEMENT, TRANSFEMORAL;  Surgeon: [name], MD;  Location: [facility];  Service: Open Heart Surgery;  Laterality: N/A;  . TRANSURETHRAL RESECTION OF PROSTATE  12/23/2018   Procedure: CYSTOSCOPY WITH  BLADDER BIOPSY WITH FULGERATION/ TRANSURETHRAL RESECTION PROSTATE  AND PROSTATE BIOPSY;  Surgeon: [name], MD;  [facility];  Service: Urology;;   HPI:  BRODEE MCGILLIVARY is a 84 y.o. 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  This was described as a mechanical fall, with him slipping on slippery floor at home, and injuring his tailbone.  He typically uses a walker to walk, had increasing difficulty and has been using a wheelchair for the last day.  He was evaluated by his PCP on 2/3, and found to be doing fairly well, though unfortunately in the intervening time he has had decreased p.o. intake and progressive weakness, culminating with an episode of frank hematuria today, prompting his daughter call EMS, to take him to the emergency department.  Reports associated back, leg and tailbone pain.   Assessment / Plan / Recommendation Clinical Impression  Clinical swallowing evaluation was completed using thin liquids via spoon, cup and straw, pureed material and regular solids.  The patient is known to [place] with previous MBS completed 09/26/19 showing significant dysphagia.  Per charting the patient and wife decilned any type of non oral  nutrition and the patient was started on a pureed diet with thin liquids.  Per wife whom was present at the bedside he has been receiving speech therapy at home.  Cranial nerve exam was completed and unremarkable.  Lingual, labial, facial and jaw range of motion and strength were adequate.  Facial sensation was intact and he did not endorse any difference in sensation from the right to left side of his face.  Obvious oral and pharyngeal swallowing issues were not seen during this exam.  He masticated solids well.  Swallow trigger was appreciated to palpation and overt s/s of aspiration were not seen.  Given previous MBS showing significant dysphagia recommend repeat MBS to determine if there has been any change in his swallow function especially given he was recieving  speech therapy.  Suggest he continue on his current diet wtih aggressive oral care 3x/day.   SLP Visit Diagnosis: Dysphagia, unspecified (R13.10)    Aspiration Risk  Moderate aspiration risk    Diet Recommendation   Continue current diet pending MBS next date  Medication Administration: Other (Comment)(per patient)    Other  Recommendations Oral Care Recommendations: Oral care BID   Follow up Recommendations Other (comment)(TBD)      Frequency and Duration   To be determined         Prognosis Prognosis for Safe Diet Advancement: Fair      Swallow Study   General Date of Onset: 12/19/19 HPI: DALER SALERNO is a 84 y.o. 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  This was described as a mechanical fall, with him slipping on slippery floor at home, and injuring his tailbone.  He typically uses a walker to walk, had increasing difficulty and has been using a wheelchair for the last day.  He was evaluated by his PCP on 2/3, and found to be doing fairly well, though unfortunately in the intervening time he has had decreased p.o. intake and progressive weakness, culminating with an episode of frank hematuria today, prompting his daughter call EMS, to take him to the emergency department.  Reports associated back, leg and tailbone pain. Type of Study: Bedside Swallow Evaluation Previous Swallow Assessment: MBS dated 09/26/19 Diet Prior to this Study: Regular;Thin liquids Temperature Spikes Noted: No Respiratory Status: Room air History of Recent Intubation: No Behavior/Cognition: Alert;Cooperative;Pleasant mood Oral Cavity Assessment: Within Functional Limits Oral Care Completed by SLP:  No Oral Cavity - Dentition: Adequate natural dentition Vision: Functional for self-feeding Self-Feeding Abilities: Able to feed self Patient Positioning: Upright in bed Baseline Vocal Quality: Normal Volitional Cough: (not tested) Volitional Swallow: Able to elicit    Oral/Motor/Sensory Function Overall Oral Motor/Sensory Function: Within functional limits   Ice Chips Ice chips: Not tested   Thin Liquid Thin Liquid: Within functional limits Presentation: Cup;Spoon;Straw;Self Fed    Nectar Thick Nectar Thick Liquid: Not tested   Honey Thick Honey Thick Liquid: Not tested   Puree Puree: Within functional limits Presentation: Self Fed;Spoon   Solid     Solid: Within functional limits Presentation: [place], [place], [place] [facility] SLP [phone]  Lamar Sprinkles 12/21/2019,3:02 PM

## 2019-12-22 ENCOUNTER — Inpatient Hospital Stay (HOSPITAL_COMMUNITY): Payer: Medicare Other

## 2019-12-22 DIAGNOSIS — J690 Pneumonitis due to inhalation of food and vomit: Secondary | ICD-10-CM

## 2019-12-22 LAB — CBC WITH DIFFERENTIAL/PLATELET
Abs Immature Granulocytes: 0.02 10*3/uL (ref 0.00–0.07)
Basophils Absolute: 0 10*3/uL (ref 0.0–0.1)
Basophils Relative: 1 %
Eosinophils Absolute: 0.1 10*3/uL (ref 0.0–0.5)
Eosinophils Relative: 2 %
HCT: 28.2 % — ABNORMAL LOW (ref 39.0–52.0)
Hemoglobin: 9.7 g/dL — ABNORMAL LOW (ref 13.0–17.0)
Immature Granulocytes: 1 %
Lymphocytes Relative: 30 %
Lymphs Abs: 1.2 10*3/uL (ref 0.7–4.0)
MCH: 32.7 pg (ref 26.0–34.0)
MCHC: 34.4 g/dL (ref 30.0–36.0)
MCV: 94.9 fL (ref 80.0–100.0)
Monocytes Absolute: 0.5 10*3/uL (ref 0.1–1.0)
Monocytes Relative: 12 %
Neutro Abs: 2.2 10*3/uL (ref 1.7–7.7)
Neutrophils Relative %: 54 %
Platelets: 98 10*3/uL — ABNORMAL LOW (ref 150–400)
RBC: 2.97 MIL/uL — ABNORMAL LOW (ref 4.22–5.81)
RDW: 13.9 % (ref 11.5–15.5)
WBC: 4 10*3/uL (ref 4.0–10.5)
nRBC: 0 % (ref 0.0–0.2)

## 2019-12-22 LAB — BASIC METABOLIC PANEL
Anion gap: 7 (ref 5–15)
BUN: 27 mg/dL — ABNORMAL HIGH (ref 8–23)
CO2: 25 mmol/L (ref 22–32)
Calcium: 8.2 mg/dL — ABNORMAL LOW (ref 8.9–10.3)
Chloride: 101 mmol/L (ref 98–111)
Creatinine, Ser: 0.79 mg/dL (ref 0.61–1.24)
GFR calc Af Amer: >60 mL/min (ref >60)
GFR calc non Af Amer: >60 mL/min (ref >60)
Glucose, Bld: 95 mg/dL (ref 70–99)
Potassium: 4.1 mmol/L (ref 3.5–5.1)
Sodium: 133 mmol/L — ABNORMAL LOW (ref 135–145)

## 2019-12-22 MED ORDER — AMOXICILLIN-POT CLAVULANATE 875-125 MG PO TABS
1.0000 | ORAL_TABLET | Freq: Two times a day (BID) | ORAL | Status: DC
  Administered 2019-12-22 – 2019-12-24 (×5): 1 via ORAL
  Filled 2019-12-22 (×5): qty 1

## 2019-12-22 NOTE — TOC Initial Note (Signed)
Transition of Care [facility]) - Initial/Assessment Note    Patient Details  Name: Corey Oneill MRN: [id number] Date of Birth: Sep 06, 1936  [facility] CM/SW Contact:    Bartholomew Crews, RN Phone Number: [phone] 12/22/2019, 5:13 PM  Clinical Narrative:                 Notified by RN and OT that patient was ready to transition and needed SNF. Spoke with spouse on the phone. PTA home with spouse, ambulates with RW, and requires assist with adls. Spouse pays out of pocket for caregiver assist through [place]. Discussed rehab recommendations. Patient has also been receiving [facility] services for RN, PT, ST through [facility]  Spouse in agreement. Patient has been to [facility] in the recent past. Discussed process of faxing out to area SNFs. FL2 completed. PASRR pending level 2 review. Will follow up for bed offers. TOC team following.   Expected Discharge Plan: Skilled Nursing Facility Barriers to Discharge: SNF Pending bed offer   Patient Goals and CMS Choice Patient states their goals for this hospitalization and ongoing recovery are:: rehab CMS Medicare.gov Compare Post Acute Care list provided to:: Patient Represenative (must comment) Choice offered to / list presented to : Spouse  Expected Discharge Plan and Services Expected Discharge Plan: [place]   Discharge Planning Services: CM Consult Post Acute Care Choice: [place] Living arrangements for the past 2 months: Single Family Home                 DME Arranged: N/A DME Agency: NA       HH Arranged: NA HH Agency: NA        Prior Living Arrangements/Services Living arrangements for the past 2 months: Single Family Home Lives with:: Self, Spouse              Current home services: DME    Activities of Daily Living Home Assistive Devices/Equipment: Eyeglasses, Hearing aid, Environmental consultant (specify type) ADL Screening (condition at time of admission) Patient's cognitive ability  adequate to safely complete daily activities?: Yes Is the patient deaf or have difficulty hearing?: Yes Does the patient have difficulty seeing, even when wearing glasses/contacts?: No Does the patient have difficulty concentrating, remembering, or making decisions?: No Patient able to express need for assistance with ADLs?: Yes Does the patient have difficulty dressing or bathing?: Yes Independently performs ADLs?: Yes (appropriate for developmental age) Does the patient have difficulty walking or climbing stairs?: Yes Weakness of Legs: Both Weakness of Arms/Hands: None  Permission Sought/Granted                  Emotional Assessment       Orientation: : Oriented to Self, Oriented to Place, Oriented to  Time, Oriented to Situation Alcohol / Substance Use: Not Applicable Psych Involvement: No (comment)  Admission diagnosis:  Pelvic fracture ([place]) [S32.9XXA] Closed fracture of ramus of left pubis, initial encounter ([place]) [place] acquired pneumonia of right upper lobe of lung [J18.9] Aspiration pneumonia ([place]) [J69.0] Patient Active Problem List   Diagnosis Date Noted  . Aspiration pneumonia ([place]) 12/20/2019  . Pelvic fracture ([place]) 12/19/2019  . Seizures ([place]) 11/26/2019  . Seizure ([place]) 11/26/2019  . Essential hypertension 11/25/2019  . Pressure injury of skin 11/15/2019  . TIA (transient ischemic attack) 11/15/2019  . Word finding difficulty 11/14/2019  . Transient alteration of awareness 09/25/2019  . Idiopathic thrombocytopenic purpura (ITP) ([facility]) 09/25/2019  . Macrocytic anemia 09/25/2019  .  Bacteremia due to Enterococcus 09/11/2019  . Bullous pemphigoid 06/12/2019  . 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 Radiation proctitis   . Hx of radiation therapy   . Heart murmur   . H/O blood clots   . Family history of adverse reaction to anesthesia   . CAD (coronary artery disease)   . Aortic stenosis   . Anginal pain ([place])    . History of ITP 02/26/2017  . 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 Malignant melanoma in situ ([place]) 09/04/2016  . 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 S/P appendectomy 11/12/2015  . Esophageal reflux 03/22/2015  . Depression 11/20/2014  . Prostate cancer ([place]) 03/05/2013  . Erectile dysfunction   . Hyperlipidemia 06/07/2007   PCP:  [name], MD Pharmacy:   [facility], [address] [place] [address] Phone: [phone] Fax: [phone]     Social Determinants of Health (SDOH) Interventions    Readmission Risk Interventions No flowsheet data found.

## 2019-12-22 NOTE — Progress Notes (Addendum)
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  Imagings on presentation showed left inferior pubic ramus fracture.  Chest x-ray showed aspiration pneumonia.  Orthopedics consulted.  Plan for conservative management and outpatient follow-up.  He was started on antibiotics for aspiration pneumonia.  Currently respiratory status stable.  PT/OT recommended skilled facility.  He is hemodynamically stable for discharge to skilled nursing facility as soon as bed is available.  Assessment & Plan:   Active Problems:   Pelvic fracture (HCC)   Aspiration pneumonia (HCC)   Aspiration pneumonia: Presented with cough.Chest xray showed right upper lobe and lingular pneumonia.  He has history of aspiration and follows with outpatient speech therapy.  As per the patient, he does not wheeze fully strict dietary modification and he understands the risks of respiratory complication including pneumonia.  Started on Augmentin today.  He was on Unasyn.  Pelvic fracture/fall: Imaging showed nondisplaced left inferior pubic ramus fracture following mechanical fall.  Orthopedics was consulted.  Recommended weight bearing as tolerated with outpatient follow-up in 3 to 4 weeks with Dr. Doreatha Martin.  PT/OT recommended skilled nursing facility.  Hypokalemia: Supplemented and corrected  Moderate protein calorie malnutrition: Dietitian following.  Metastatic prostate cancer: On Xtandi.CT of his pelvis noted sclerotic lesion in the superior aspect of the left acetabulum and right iliac bone.This is new since prior CT and concerning for  metastatic disease. Outpatient follow-up with patient's oncologist on discharge.  History of recurrent enterococcal UTI/bacteremia: On indefinite amoxicillin for suppressive therapy.  History of bullous pemphigoid: On chronic doxycycline therapy.  Nutrition Problem: Inadequate oral intake Etiology: cancer and cancer related treatments, decreased appetite(metasttic prostate cancer)      DVT prophylaxis:Lovenox Code Status: DNR Family Communication: called daughter on phone Disposition Plan: Patient is from home.  PT and OT recommended skilled nursing facility.  Currently waiting for bed.   Consultants: Orthopedics  Procedures: None Antimicrobials:  Anti-infectives (From admission, onward)   Start     Dose/Rate Route Frequency Ordered Stop   12/20/19 1200  ampicillin-sulbactam (UNASYN) 1.5 g in sodium chloride 0.9 % 100 mL IVPB     1.5 g 200 mL/hr over 30 Minutes Intravenous Every 6 hours 12/20/19 1105     12/19/19 2215  doxycycline (VIBRA-TABS) tablet 100 mg     100 mg Oral 2 times daily 12/19/19 2205     12/19/19 1745  cefTRIAXone (ROCEPHIN) 1 g in sodium chloride 0.9 % 100 mL IVPB     1 g 200 mL/hr over 30 Minutes Intravenous  Once 12/19/19 1735 12/19/19 2039   12/19/19 1745  azithromycin (ZITHROMAX) 500 mg in sodium chloride 0.9 % 250 mL IVPB     500 mg 250 mL/hr over 60 Minutes Intravenous  Once 12/19/19 1735 12/19/19 2039      Subjective: Patient seen and examined at the bedside this morning.  Hemodynamically stable.  Respiratory status stable.  Currently saturating fine on room air.  Denies any shortness of breath or chest pain.  Denies any hip pain.  Objective: Vitals:   12/21/19 1958 12/22/19 0402 12/22/19 0816 12/22/19 0819  BP: 113/67 (!) 159/72  (!) 158/81  Pulse: 74 72 73 68  Resp:  16 16  Temp: 98.5 F (36.9 C) 98.4 F (36.9 C)  97.7 F (36.5 C)  TempSrc: Oral Oral  Oral  SpO2: 97% 98% 99% 98%  Weight:      Height:        Intake/Output Summary  (Last 24 hours) at 12/22/2019 1348 Last data filed at 12/22/2019 0400 Gross per 24 hour  Intake 620 ml  Output 1752 ml  Net -1132 ml   Filed Weights   12/20/19 0005  Weight: 59.9 kg    Examination:  General exam: Appears calm and comfortable ,Not in distress, elderly pleasant male HEENT:PERRL,Oral mucosa moist, Ear/Nose normal on gross exam Respiratory system: Bilateral equal air entry, normal vesicular breath sounds, no wheezes or crackles  Cardiovascular system: S1 & S2 heard, RRR. No JVD, murmurs, rubs, gallops or clicks. No pedal edema. Gastrointestinal system: Abdomen is nondistended, soft and nontender. No organomegaly or masses felt. Normal bowel sounds heard. Central nervous system: Alert and oriented. No focal neurological deficits. Extremities: No edema, no clubbing ,no cyanosis Skin: No rashes, lesions or ulcers,no icterus ,no pallor   Data Reviewed: I have personally reviewed following labs and imaging studies  CBC: Recent Labs  Lab 12/16/19 1341 12/19/19 1601 12/20/19 0047 12/21/19 0407 12/22/19 0301  WBC 8.6 7.7 4.6 3.9* 4.0  NEUTROABS 7.1* 6.1 3.2 2.3 2.2  HGB 11.1* 11.3* 10.0* 9.9* 9.7*  HCT 32.0* 32.1* 29.7* 29.1* 28.2*  MCV 94 94.1 95.8 95.7 94.9  PLT 69* PLATELET CLUMPS NOTED ON SMEAR, COUNT APPEARS DECREASED 81* 87* 98*   Basic Metabolic Panel: Recent Labs  Lab 12/16/19 1341 12/19/19 1601 12/20/19 0047 12/21/19 0407 12/22/19 0301  NA 135 131* 133* 135 133*  K 4.2 4.0 3.2* 4.1 4.1  CL 96 95* 97* 100 101  CO2 25 24 23 27 25   GLUCOSE 90 93 110* 98 95  BUN 18 12 10 13  27*  CREATININE 0.72* 0.75 0.69 0.66 0.79  CALCIUM 8.7 8.4* 8.1* 8.1* 8.2*   GFR: Estimated Creatinine Clearance: 61.4 mL/min (by C-G formula based on SCr of 0.79 mg/dL). Liver Function Tests: Recent Labs  Lab 12/16/19 1341 12/19/19 1601 12/21/19 0407  AST 13 17 15   ALT 6 10 9   ALKPHOS 96 74 63  BILITOT 0.4 0.8 0.8  PROT 6.0 6.4* 5.5*  ALBUMIN 3.6 2.8* 2.4*   No  results for input(s): LIPASE, AMYLASE in the last 168 hours. No results for input(s): AMMONIA in the last 168 hours. Coagulation Profile: No results for input(s): INR, PROTIME in the last 168 hours. Cardiac Enzymes: No results for input(s): CKTOTAL, CKMB, CKMBINDEX, TROPONINI in the last 168 hours. BNP (last 3 results) No results for input(s): PROBNP in the last 8760 hours. HbA1C: No results for input(s): HGBA1C in the last 72 hours. CBG: No results for input(s): GLUCAP in the last 168 hours. Lipid Profile: No results for input(s): CHOL, HDL, LDLCALC, TRIG, CHOLHDL, LDLDIRECT in the last 72 hours. Thyroid Function Tests: No results for input(s): TSH, T4TOTAL, FREET4, T3FREE, THYROIDAB in the last 72 hours. Anemia Panel: No results for input(s): VITAMINB12, FOLATE, FERRITIN, TIBC, IRON, RETICCTPCT in the last 72 hours. Sepsis Labs: Recent Labs  Lab 12/19/19 1602 12/20/19 0047  PROCALCITON  --  <0.10  LATICACIDVEN 1.3  --     Recent Results (from the past 240 hour(s))  Urine culture     Status: None   Collection Time: 12/19/19  3:27 PM   Specimen: Urine, Random  Result Value Ref Range Status  Specimen Description URINE, RANDOM  Final   Special Requests NONE  Final   Culture   Final    NO GROWTH Performed at [facility], [place] [address], [place], [place] [address]    Report Status 12/21/2019 FINAL  Final  Culture, blood (routine x 2)     Status: None (Preliminary result)   Collection Time: 12/19/19  6:16 PM   Specimen: BLOOD  Result Value Ref Range Status   Specimen Description BLOOD LEFT ANTECUBITAL  Final   Special Requests   Final    BOTTLES DRAWN AEROBIC AND ANAEROBIC Blood Culture adequate volume   Culture   Final    NO GROWTH 2 DAYS Performed at [facility], [place] [address], [place], [place] [address]    Report Status PENDING  Incomplete  Respiratory Panel by RT PCR (Flu A&B, Covid) - Nasopharyngeal Swab     Status: None   Collection Time: 12/19/19   6:21 PM   Specimen: Nasopharyngeal Swab  Result Value Ref Range Status   SARS Coronavirus 2 by RT PCR NEGATIVE NEGATIVE Final    Comment: (NOTE) SARS-CoV-2 target nucleic acids are NOT DETECTED. The SARS-CoV-2 RNA is generally detectable in upper respiratoy specimens during the acute phase of infection. The lowest concentration of SARS-CoV-2 viral copies this assay can detect is 131 copies/mL. A negative result does not preclude SARS-Cov-2 infection and should not be used as the sole basis for treatment or other patient management decisions. A negative result may occur with  improper specimen collection/handling, submission of specimen other than nasopharyngeal swab, presence of viral mutation(s) within the areas targeted by this assay, and inadequate number of viral copies (<131 copies/mL). A negative result must be combined with clinical observations, patient history, and epidemiological information. The expected result is Negative. Fact Sheet for Patients:  PinkCheek.be Fact Sheet for Healthcare Providers:  GravelBags.it This test is not yet ap proved or cleared by the [place] FDA and  has been authorized for detection and/or diagnosis of SARS-CoV-2 by FDA under an Emergency Use Authorization (EUA). This EUA will remain  in effect (meaning this test can be used) for the duration of the COVID-19 declaration under Section 564(b)(1) of the Act, 21 U.S.C. section 360bbb-3(b)(1), unless the authorization is terminated or revoked sooner.    Influenza A by PCR NEGATIVE NEGATIVE Final   Influenza B by PCR NEGATIVE NEGATIVE Final    Comment: (NOTE) The Xpert Xpress SARS-CoV-2/FLU/RSV assay is intended as an aid in  the diagnosis of influenza from Nasopharyngeal swab specimens and  should not be used as a sole basis for treatment. Nasal washings and  aspirates are unacceptable for Xpert Xpress SARS-CoV-2/FLU/RSV   testing. Fact Sheet for Patients: PinkCheek.be Fact Sheet for Healthcare Providers: GravelBags.it This test is not yet approved or cleared by the [place] FDA and  has been authorized for detection and/or diagnosis of SARS-CoV-2 by  FDA under an Emergency Use Authorization (EUA). This EUA will remain  in effect (meaning this test can be used) for the duration of the  Covid-19 declaration under Section 564(b)(1) of the Act, 21  U.S.C. section 360bbb-3(b)(1), unless the authorization is  terminated or revoked. Performed at [facility], [place] [address] [place], [place] [address]   Culture, blood (routine x 2)     Status: None (Preliminary result)   Collection Time: 12/20/19 12:56 AM   Specimen: BLOOD RIGHT FOREARM  Result Value Ref Range Status   Specimen Description BLOOD  RIGHT FOREARM  Final   Special Requests   Final    BOTTLES DRAWN AEROBIC AND ANAEROBIC Blood Culture adequate volume   Culture   Final    NO GROWTH 1 DAY Performed at [facility], [address] [place], [place] [address]    Report Status PENDING  Incomplete         Radiology Studies: DG Swallowing Func-Speech Pathology  Result Date: 12/22/2019 Objective Swallowing Evaluation: Type of Study: MBS-Modified Barium Swallow Study  Completed and documented by Lenore Manner, SLP Student Supervised by Herbie Baltimore, MA [facility] Pager [phone] Office [phone] Patient Details Name: JAHZIAH SICLARI MRN: [id number] Date of Birth: 12-17-1935 Today's Date: 12/22/2019 Time: SLP Start Time (ACUTE ONLY): 0900 -SLP Stop Time (ACUTE ONLY): 0923 SLP Time Calculation (min) (ACUTE ONLY): 23 min Past Medical History: Past Medical History: Diagnosis Date . Abdominal aortic atherosclerosis ([place])  . Anginal pain ([place])   last noted 08/17/19 . Bone cancer ([place])   Left hip . BPPV (benign paroxysmal positional vertigo)  . Bullous pemphigoid   . CAD (coronary artery disease)   a.  LHC 8/16: Mid to distal LAD 30%, OM1 40%, proximal mid RCA 40%, distal RCA 60% >> FFR 0.69  >> PCI: 3 x 15 mm Resolute DES . Depression  . Esophageal reflux  . Grade I diastolic dysfunction [id number]  Noted on ECHO  . H/O blood clots   "get them in my stool and urine" (11/12/2015) . Heart murmur  . Hepatic lesion 12/08/2015  Stable 8 mm right hepatic lesion . History of aortic stenosis   a. peak to peak gradient by LHC 8/16:  37 mmHg (moderately severe)  . History of appendicitis 2016 . History of blood transfusion 12/2018 . History of herpes labialis  . History of ITP   2018, thrombocytopenia . History of kidney stones  . Hx of radiation therapy 04/14/13-06/09/13  prostate 7800 cGy, 40 sessions, seminal vesicles 5600 cGy 40 sessions . Hydrocele 2006  Small . Hyperlipidemia  . Insomnia   resolved . LVH (left ventricular hypertrophy)   Moderate, noted on ECHO 09/2018 . Malignant melanoma in situ ([place]) 09/04/2016  Right neck and chest . Metastasis from malignant neoplasm of prostate ([place]) 02/25/2019 . Prostate cancer ([place]) dx'd 2014 . Radiation proctitis  . Renal mass 2017  Bilateral renal masses . S/P skin biopsy 04/09/2019  subepidermal cell poor vesicle , Linear IGG and C3 the basement membrane . Suprapubic catheter ([place])  . Wears hearing aid in both ears  Past Surgical History: Past Surgical History: Procedure Laterality Date . CARDIAC CATHETERIZATION N/A 04/21/2015  Procedure: Right/Left Heart Cath and Coronary Angiography;  Surgeon: [name], MD;  Location: [facility];  Service: Cardiovascular;  Laterality: N/A; . CARDIAC CATHETERIZATION N/A 06/18/2015  Procedure: Intravascular Pressure Wire/FFR Study;  Surgeon: [name], MD;  Location: [facility];  Service: Cardiovascular;  Laterality: N/A; . CARDIAC CATHETERIZATION N/A 06/18/2015  Procedure: Coronary Stent Intervention;  Surgeon: [name], MD;  Location: [facility];  Service: Cardiovascular;   Laterality: N/A; . CARDIAC CATHETERIZATION N/A 06/18/2015  Procedure: Right/Left Heart Cath and Coronary Angiography;  Surgeon: [name], MD;  Location: [facility];  Service: Cardiovascular;  Laterality: N/A; . CARDIAC CATHETERIZATION N/A 06/23/2015  Procedure: Left Heart Cath and Cors/Grafts Angiography;  Surgeon: [name], MD;  Location: [facility];  Service: Cardiovascular;  Laterality: N/A; . CARDIAC CATHETERIZATION N/A 01/17/2016  Procedure: Right/Left Heart Cath and Coronary Angiography;  Surgeon: [name], MD;  Location: [facility];  Service: Cardiovascular;  Laterality: N/A; . CATARACT EXTRACTION, BILATERAL   . COLONOSCOPY  06/26/2017 . CYSTOSCOPY WITH BIOPSY N/A 07/30/2018  Procedure: CYSTOSCOPY WITH BIOPSY/FULGURATION, CYSTOLTHOLAPAXY;  Surgeon: [name], MD;  Location: [facility];  Service: Urology;  Laterality: N/A; . CYSTOSCOPY WITH URETHRAL DILATATION N/A 08/18/2019  Procedure: CYSTOSCOPY WITH URETHRAL DILATATION USING BALLOON OR LASER/ SPURO PUBIC TUBE CHANGE LASER EXCISION OF URETHRAL STRICTURE RETROGRADE URETHROGRAM ANTEGRADE CYSTOGRAM;  Surgeon: [name], MD;  Location: [facility];  Service: Urology;  Laterality: N/A; . FRACTURE SURGERY   . INGUINAL HERNIA REPAIR    patient does not remember this procedure . LAPAROSCOPIC APPENDECTOMY N/A 11/16/2015  Procedure: APPENDECTOMY LAPAROSCOPIC;  Surgeon: [name], MD;  Location: [facility];  Service: General;  Laterality: N/A; . LEFT HEART CATH AND CORONARY ANGIOGRAPHY N/A 12/26/2016  Procedure: Left Heart Cath and Coronary Angiography;  Surgeon: [place] Sine, MD;  Location: [facility];  Service: Cardiovascular;  Laterality: N/A; . MELANOMA EXCISION Right 10/24/2016  Procedure: WIDE EXCISION MELANOMA RIGHT NECK AND RIGHT CHEST TIMES 2;  Surgeon: [name], MD;  Location: [facility];  Service: General;  Laterality: Right;  right medial and lateral lesion of chest and  right neck . NASAL FRACTURE SURGERY    "broken years ago; several ORs to correct it" . NASAL SEPTUM SURGERY   . PROSTATE BIOPSY  2014  "needle biopsy" . TEE WITHOUT CARDIOVERSION N/A 02/15/2016  Procedure: TRANSESOPHAGEAL ECHOCARDIOGRAM (TEE);  Surgeon: [name], MD;  Location: [facility];  Service: Open Heart Surgery;  Laterality: N/A; . TEE WITHOUT CARDIOVERSION N/A 08/26/2019  Procedure: TRANSESOPHAGEAL ECHOCARDIOGRAM (TEE);  Surgeon: Pixie Casino, MD;  Location: [facility];  Service: Cardiovascular;  Laterality: N/A; . TONSILLECTOMY   . TRANSCATHETER AORTIC VALVE REPLACEMENT, TRANSFEMORAL  02/15/2016 . TRANSCATHETER AORTIC VALVE REPLACEMENT, TRANSFEMORAL N/A 02/15/2016  Procedure: TRANSCATHETER AORTIC VALVE REPLACEMENT, TRANSFEMORAL;  Surgeon: [name], MD;  Location: [facility];  Service: Open Heart Surgery;  Laterality: N/A; . TRANSURETHRAL RESECTION OF PROSTATE  12/23/2018  Procedure: CYSTOSCOPY WITH  BLADDER BIOPSY WITH FULGERATION/ TRANSURETHRAL RESECTION PROSTATE  AND PROSTATE BIOPSY;  Surgeon: [name], MD;  Location[facility];  Service: Urology;; HPI: Corey WIGGINGTON is a 84 y.o. 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  This was described as a mechanical fall, with him slipping on slippery floor at home, and injuring his tailbone.  He typically uses a walker to walk, had increasing difficulty and has been using a wheelchair for the last day.  He was evaluated by his PCP on 2/3, and found to be doing fairly well, though unfortunately in the intervening time he has had decreased p.o. intake and progressive weakness, culminating with an episode of frank hematuria today, prompting his daughter call  EMS, to take him to the emergency department.  Reports associated back, leg and tailbone pain.  Subjective: alert, upright in chair Assessment / Plan / Recommendation CHL IP CLINICAL IMPRESSIONS 12/22/2019 Clinical Impression Paitient presents with moderate oropharyngeal dysphagia across all consistencies. Oral phase was remarkable for decreased bolus cohesion and reduced lingual control resulting in premature spillage to the vallecula. Pharyngeal phase was remarkable for reduced  BOT retraction and epiglottic inversion, reduced hyolaryngeal excursion resulting in vallecular and pyriform sinus residue, and reduced laryngeal closure resulting in silent aspiration. Trace aspiration occured with thin liquids during the swallow (without compensatory strategies), and pt was unable to clear aspirate from airway despite Max cues from clinician d/t weak cough and reduced laryngeal strength. SLP cued pt to use effortful swallow, but conducted the supraglottic swallow instead. This ultimately prevented the pt from aspirating by increasing the pt's overall laryngeal closure. Significant residue was observed in vallecula following puree and solid conistencies, but reduced if pt utilized a liquid wash with strategies. Recommened regular diet with thin liquids, using the super supra glottic swallow with thin liquids in order to maintain the least restrictive diet. Pt should have full supervision via staff while eating and drinking. Pt should also have strict oral care, 3x/day in order to decrease oropharyngeal bacteria. SLP will further educate the pt's wife on results from [facility], how to guide/supervise the pt while using the compensatory strategy, and the importance of strict oral care. SLP Visit Diagnosis Dysphagia, oropharyngeal phase (R13.12) Attention and concentration deficit following -- Frontal lobe and executive function deficit following -- Impact on safety and function Moderate aspiration risk   CHL IP TREATMENT  RECOMMENDATION 12/22/2019 Treatment Recommendations Therapy as outlined in treatment plan below   Prognosis 12/22/2019 Prognosis for Safe Diet Advancement Good Barriers to Reach Goals -- Barriers/Prognosis Comment -- CHL IP DIET RECOMMENDATION 12/22/2019 SLP Diet Recommendations Regular solids;Thin liquid Liquid Administration via Straw Medication Administration Crushed with puree Compensations Minimize environmental distractions;Slow rate;Small sips/bites;Follow solids with liquid;Clear throat after each swallow;Effortful swallow Postural Changes Remain semi-upright after after feeds/meals (Comment);Seated upright at 90 degrees   CHL IP OTHER RECOMMENDATIONS 12/22/2019 Recommended Consults -- Oral Care Recommendations Oral care BID Other Recommendations --   CHL IP FOLLOW UP RECOMMENDATIONS 12/22/2019 Follow up Recommendations Other (comment)   CHL IP FREQUENCY AND DURATION 12/22/2019 Speech Therapy Frequency (ACUTE ONLY) min 2x/week Treatment Duration 2 weeks      CHL IP ORAL PHASE 12/22/2019 Oral Phase Impaired Oral - Pudding Teaspoon -- Oral - Pudding Cup -- Oral - Honey Teaspoon -- Oral - Honey Cup -- Oral - Nectar Teaspoon -- Oral - Nectar Cup WFL Oral - Nectar Straw -- Oral - Thin Teaspoon -- Oral - Thin Cup Premature spillage;Decreased bolus cohesion Oral - Thin Straw Decreased bolus cohesion;Premature spillage Oral - Puree WFL Oral - Mech Soft -- Oral - Regular WFL Oral - Multi-Consistency -- Oral - Pill -- Oral Phase - Comment --  CHL IP PHARYNGEAL PHASE 12/22/2019 Pharyngeal Phase Impaired Pharyngeal- Pudding Teaspoon -- Pharyngeal -- Pharyngeal- Pudding Cup -- Pharyngeal -- Pharyngeal- Honey Teaspoon -- Pharyngeal -- Pharyngeal- Honey Cup -- Pharyngeal -- Pharyngeal- Nectar Teaspoon -- Pharyngeal -- Pharyngeal- Nectar Cup Pharyngeal residue - valleculae;Pharyngeal residue - pyriform;Reduced tongue base retraction;Reduced laryngeal elevation Pharyngeal -- Pharyngeal- Nectar Straw -- Pharyngeal -- Pharyngeal- Thin  Teaspoon -- Pharyngeal -- Pharyngeal- Thin Cup Penetration/Aspiration during swallow;Reduced airway/laryngeal closure;Trace aspiration;Reduced epiglottic inversion;Pharyngeal residue - pyriform;Pharyngeal residue - valleculae;Reduced tongue base retraction Pharyngeal Material enters airway, passes BELOW cords without attempt by patient to eject out (silent aspiration) Pharyngeal- Thin Straw Compensatory strategies attempted (with notebox);Penetration/Aspiration during swallow;Reduced airway/laryngeal closure;Reduced tongue base retraction;Pharyngeal residue - valleculae;Pharyngeal residue - pyriform Pharyngeal Material enters airway, CONTACTS cords and not ejected out Pharyngeal- Puree Pharyngeal residue - valleculae;Pharyngeal residue - pyriform;Reduced tongue base retraction;Reduced laryngeal elevation Pharyngeal -- Pharyngeal- Mechanical Soft -- Pharyngeal -- Pharyngeal- Regular Pharyngeal residue - valleculae;Pharyngeal  residue - pyriform;Reduced laryngeal elevation;Reduced tongue base retraction Pharyngeal -- Pharyngeal- Multi-consistency -- Pharyngeal -- Pharyngeal- Pill -- Pharyngeal -- Pharyngeal Comment --  CHL IP CERVICAL ESOPHAGEAL PHASE 12/22/2019 Cervical Esophageal Phase WFL Pudding Teaspoon -- Pudding Cup -- Honey Teaspoon -- Honey Cup -- Nectar Teaspoon -- Nectar Cup -- Nectar Straw -- Thin Teaspoon -- Thin Cup -- Thin Straw -- Puree -- Mechanical Soft -- Regular -- Multi-consistency -- Pill -- Cervical Esophageal Comment -- Supervised by Lynann Beaver 12/22/2019, 11:19 AM                   Scheduled Meds: . aspirin EC  81 mg Oral QPM  . carvedilol  3.125 mg Oral BID AC  . doxycycline  100 mg Oral BID  . DULoxetine  60 mg Oral Daily  . enoxaparin (LOVENOX) injection  40 mg Subcutaneous QHS  . enzalutamide  160 mg Oral Q supper  . feeding supplement (ENSURE ENLIVE)  237 mL Oral BID BM  . ferrous sulfate  325 mg Oral Q breakfast  . isosorbide mononitrate  30 mg Oral Daily  .  lamoTRIgine  50 mg Oral BID  . multivitamin with minerals  1 tablet Oral Daily  . nutrition supplement (JUVEN)  1 packet Oral BID BM  . rosuvastatin  10 mg Oral Daily  . cyanocobalamin  1,000 mcg Oral Daily   Continuous Infusions: . ampicillin-sulbactam (UNASYN) IV 1.5 g (12/22/19 1237)     LOS: 2 days    Time spent: 35 mins.More than 50% of that time was spent in counseling and/or coordination of care.      Shelly Coss, MD [facility] P2/06/2020, 1:48 PM

## 2019-12-22 NOTE — Plan of Care (Signed)
  Problem: Pain Managment: Goal: General experience of comfort will improve Outcome: Progressing   Problem: Safety: Goal: Ability to remain free from injury will improve Outcome: Progressing   Problem: Skin Integrity: Goal: Risk for impaired skin integrity will decrease Outcome: Progressing   

## 2019-12-22 NOTE — Progress Notes (Signed)
Physical Therapy Treatment Patient Details Name: Corey Oneill MRN: [id number] DOB: [birth year] Today's Date: 12/22/2019    History of Present Illness 84 y.o. male with fall 12/16/19 with left inferior pubic rami fx with aspiration PNA. PMHx: metastatic prostate cancer, chronic ITP, aortic stenosis status post TAVR, recurrent enterococcal bacteremia, CAD, GERD, HLD    PT Comments    Pt pleasant and reports no pain. Pt states he is a fan of the [employer] and has been to at least 54 races. Pt with decreased functional mobility with posterior left lean and requires physical assist for all transfers and gait. Pt reporting wife still works and is not available 24hrs but unsure of accuracy. If wife not able to provide guarding and physical assist with all mobility and without further family support than SNF may be needed.    Follow Up Recommendations  Home health PT;Supervision/Assistance - 24 hour, SNF if family not present 24     Equipment Recommendations  None recommended by PT    Recommendations for Other Services       Precautions / Restrictions Precautions Precautions: Fall Restrictions Weight Bearing Restrictions: No    Mobility  Bed Mobility Overal bed mobility: Needs Assistance Bed Mobility: Supine to Sit     Supine to sit: Min guard     General bed mobility comments: increased time and effort with HOB 20 degrees  Transfers Overall transfer level: Needs assistance   Transfers: Sit to/from Stand Sit to Stand: Min assist         General transfer comment: min assist to rise from bed x 2 trials with cues for hand placement and scooting to the edge  Ambulation/Gait Ambulation/Gait assistance: Min assist Gait Distance (Feet): 180 Feet Assistive device: Rolling walker (2 wheeled) Gait Pattern/deviations: Step-through pattern;Decreased weight shift to left;Decreased stride length;Leaning posteriorly   Gait velocity interpretation: <1.8 ft/sec, indicate of risk for  recurrent falls General Gait Details: pt with posterior left lean in standing with physical assist to correct and maintain balance. Cues for position in RW with assist to direct and steer RW   Stairs Stairs: Yes Stairs assistance: Min assist Stair Management: Step to pattern;Backwards;With walker Number of Stairs: 1 General stair comments: min assist with cues for sequence   Wheelchair Mobility    Modified Rankin (Stroke Patients Only)       Balance Overall balance assessment: Needs assistance   Sitting balance-Leahy Scale: Fair Sitting balance - Comments: posterior left lean, minguard for sitting   Standing balance support: Bilateral upper extremity supported;During functional activity Standing balance-Leahy Scale: Poor Standing balance comment: reliant on BUE and external support                            Cognition Arousal/Alertness: Awake/alert Behavior During Therapy: WFL for tasks assessed/performed Overall Cognitive Status: No family/caregiver present to determine baseline cognitive functioning Area of Impairment: Memory;Safety/judgement;Problem solving                     Memory: Decreased short-term memory;Decreased recall of precautions   Safety/Judgement: Decreased awareness of safety;Decreased awareness of deficits   Problem Solving: Slow processing;Requires verbal cues;Difficulty sequencing General Comments: pt incontinent of stool on arrival and unaware. Pt not oriented to time. Pt requires cues for safety and movement      Exercises General Exercises - Lower Extremity Long Arc Quad: AROM;Both;10 reps;Standing Hip Flexion/Marching: AROM;Both;Standing;10 reps    General Comments  Pertinent Vitals/Pain Pain Assessment: No/denies pain    Home Living Family/patient expects to be discharged to:: Private residence Living Arrangements: Spouse/significant other                  Prior Function            PT Goals  (current goals can now be found in the care plan section) Progress towards PT goals: Progressing toward goals    Frequency           PT Plan Current plan remains appropriate    Co-evaluation              AM-PAC PT "6 Clicks" Mobility   Outcome Measure  Help needed turning from your back to your side while in a flat bed without using bedrails?: None Help needed moving from lying on your back to sitting on the side of a flat bed without using bedrails?: A Little Help needed moving to and from a bed to a chair (including a wheelchair)?: A Little Help needed standing up from a chair using your arms (e.g., wheelchair or bedside chair)?: A Little Help needed to walk in hospital room?: A Little Help needed climbing 3-5 steps with a railing? : A Lot 6 Click Score: 18    End of Session Equipment Utilized During Treatment: Gait belt Activity Tolerance: Patient tolerated treatment well Patient left: with call bell/phone within reach;in chair;with chair alarm set Nurse Communication: Mobility status PT Visit Diagnosis: Other abnormalities of gait and mobility (R26.89);Muscle weakness (generalized) (M62.81);History of falling (Z91.81)     Time: [id number] PT Time Calculation (min) (ACUTE ONLY): 27 min  Charges:  $Gait Training: 8-22 mins $Therapeutic Exercise: 8-22 mins                     Brenan Modesto P, PT Acute Rehabilitation Services Pager: [phone] Office: [place] 12/22/2019, 1:00 PM

## 2019-12-22 NOTE — Progress Notes (Addendum)
Modified Barium Swallow Progress Note  Patient Details  Name: Corey Oneill MRN: [id number] Date of Birth: 10/02/1936  Today's Date: 12/22/2019  Modified Barium Swallow completed.  Full report located under Chart Review in the Imaging Section.  Brief recommendations include the following:  Clinical Impression Paitient presents with moderate oropharyngeal dysphagia across all consistencies. Oral phase was remarkable for decreased bolus cohesion and reduced lingual control resulting in premature spillage to the vallecula. Pharyngeal phase was remarkable for reduced BOT retraction and epiglottic inversion, reduced hyolaryngeal excursion resulting in vallecular and pyriform sinus residue, and reduced laryngeal closure resulting in silent aspiration. Trace aspiration occured with thin liquids during the swallow (without compensatory strategies), and pt was unable to clear aspirate from airway despite Max cues from clinician d/t weak cough and reduced laryngeal strength. SLP cued pt to use effortful swallow, but conducted the supraglottic swallow instead. This ultimately prevented the pt from aspirating by increasing the pt's overall laryngeal closure. Significant residue was observed in vallecula following puree and solid conistencies, but reduced if pt utilized a liquid wash with strategies. Recommened regular diet with thin liquids, using the super supra glottic swallow with thin liquids in order to maintain the least restrictive diet. Pt should have full supervision via staff while eating and drinking. Pt should also have strict oral care, 3x/day in order to decrease oropharyngeal bacteria. SLP will further educate the pt's wife on results from [facility], how to guide/supervise the pt while using the compensatory strategy, and the importance of strict oral care.   Swallow Evaluation Recommendations       SLP Diet Recommendations: Regular solids;Thin liquid   Liquid Administration via: Straw   Medication  Administration: Crushed with puree   Supervision: Staff to assist with self feeding;Full supervision/cueing for compensatory strategies   Compensations: Minimize environmental distractions;Slow rate;Small sips/bites;Follow solids with liquid;Clear throat after each swallow; Super supraglottic swallow   Postural Changes: Remain semi-upright after after feeds/meals (Comment);Seated upright at 90 degrees   Oral Care Recommendations: Oral care BID       Aline August, Student SLP Office: [phone]  12/22/2019,10:36 AM

## 2019-12-22 NOTE — NC FL2 (Signed)
[place] LEVEL OF CARE SCREENING TOOL     IDENTIFICATION  Patient Name: Corey Oneill Birthdate: January 13, 1936 Sex: male Admission Date (Current Location): 12/19/2019  [facility] [address] and [place] Number:  Herbalist and Address:  [facility], [place] [address], [place], [place] [address]      Provider Number: [id number]  Attending Physician Name and Address:  Shelly Coss, MD  Relative Name and Phone Number:  [name] [phone]    Current Level of Care: Hospital Recommended Level of Care: [place] Prior Approval Number:    Date Approved/Denied:   PASRR Number: pending level 2 review  Discharge Plan: SNF    Current Diagnoses: Patient Active Problem List   Diagnosis Date Noted  . Aspiration pneumonia ([place]) 12/20/2019  . Pelvic fracture ([place]) 12/19/2019  . Seizures ([place]) 11/26/2019  . Seizure ([place]) 11/26/2019  . Essential hypertension 11/25/2019  . Pressure injury of skin 11/15/2019  . TIA (transient ischemic attack) 11/15/2019  . Word finding difficulty 11/14/2019  . Transient alteration of awareness 09/25/2019  . Idiopathic thrombocytopenic purpura (ITP) (HCC) 09/25/2019  . Macrocytic anemia 09/25/2019  . Bacteremia due to Enterococcus 09/11/2019  . Bullous pemphigoid 06/12/2019  . Metastasis from malignant neoplasm of prostate ([place]) 02/25/2019  . Radiation proctitis   . Hx of radiation therapy   . Heart murmur   . H/O blood clots   . Family history of adverse reaction to anesthesia   . CAD (coronary artery disease)   . Aortic stenosis   . Anginal pain ([place])   . History of ITP 02/26/2017  . 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 Malignant melanoma in situ ([place]) 09/04/2016  . 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 S/P appendectomy 11/12/2015  . Esophageal reflux 03/22/2015  . Depression 11/20/2014  . Prostate cancer ([place]) 03/05/2013  . Erectile dysfunction   .  Hyperlipidemia 06/07/2007    Orientation RESPIRATION BLADDER Height & Weight     Self, Time, Situation, Place  Normal Incontinent Weight: 59.9 kg Height:  5\' 10"  (177.8 cm)  BEHAVIORAL SYMPTOMS/MOOD NEUROLOGICAL BOWEL NUTRITION STATUS      Incontinent Diet  AMBULATORY STATUS COMMUNICATION OF NEEDS Skin   Limited Assist Verbally Bruising(arms)                       Personal Care Assistance Level of Assistance  Bathing, Feeding, Dressing Bathing Assistance: Limited assistance Feeding assistance: Limited assistance Dressing Assistance: Limited assistance     Functional Limitations Info  Sight, Hearing, Speech Sight Info: Impaired Hearing Info: Impaired Speech Info: Adequate    SPECIAL CARE FACTORS FREQUENCY  PT (By licensed PT), OT (By licensed OT)     PT Frequency: PT at SNF to eval and treat a min of 5x/week OT Frequency: OT at SNF to eval and treat a min of 5x/week            Contractures Contractures Info: Not present    Additional Factors Info  Code Status, Allergies Code Status Info: DNR Allergies Info: prednisone           Current Medications (12/22/2019):  This is the current hospital active medication list Current Facility-Administered Medications  Medication Dose Route Frequency Provider Last Rate Last Admin  . acetaminophen (TYLENOL) tablet 650 mg  650 mg Oral Q6H PRN Clinton Sawyer A, DO   650 mg at 12/21/19 1745   Or  . acetaminophen (TYLENOL) suppository 650 mg  650 mg Rectal Q6H PRN Margart Sickles, DO      . amoxicillin-clavulanate (AUGMENTIN) 875-125 MG per tablet 1 tablet  1 tablet Oral Q12H Shelly Coss, MD   1 tablet at 12/22/19 1547  . aspirin EC tablet 81 mg  81 mg Oral QPM Clinton Sawyer A, DO   81 mg at 12/21/19 1745  . bisacodyl (DULCOLAX) suppository 10 mg  10 mg Rectal Daily PRN Margart Sickles, DO      . carvedilol (COREG) tablet 3.125 mg  3.125 mg Oral BID AC Clinton Sawyer A, DO   3.125 mg at 12/22/19 0834  . DULoxetine  (CYMBALTA) DR capsule 60 mg  60 mg Oral Daily Clinton Sawyer A, DO   60 mg at 12/22/19 0834  . enoxaparin (LOVENOX) injection 40 mg  40 mg Subcutaneous QHS Clinton Sawyer A, DO   40 mg at 12/21/19 2212  . enzalutamide (XTANDI) capsule 160 mg  160 mg Oral Q supper Clinton Sawyer A, DO   160 mg at 12/21/19 1746  . feeding supplement (ENSURE ENLIVE) (ENSURE ENLIVE) liquid 237 mL  237 mL Oral BID BM Osei-Bonsu, Iona Beard, MD   237 mL at 12/22/19 0835  . ferrous sulfate tablet 325 mg  325 mg Oral Q breakfast Clinton Sawyer A, DO   325 mg at 12/22/19 [id number]  . isosorbide mononitrate (IMDUR) 24 hr tablet 30 mg  30 mg Oral Daily Clinton Sawyer A, DO   30 mg at 12/22/19 [id number]  . lamoTRIgine (LAMICTAL) tablet 50 mg  50 mg Oral BID Clinton Sawyer A, DO   50 mg at 12/22/19 [id number]  . multivitamin with minerals tablet 1 tablet  1 tablet Oral Daily Osei-Bonsu, Iona Beard, MD   1 tablet at 12/22/19 0834  . nutrition supplement (JUVEN) (JUVEN) powder packet 1 packet  1 packet Oral BID BM Osei-Bonsu, Iona Beard, MD   1 packet at 12/22/19 1237  . ondansetron (ZOFRAN) tablet 4 mg  4 mg Oral Q6H PRN Clinton Sawyer A, DO       Or  . ondansetron (ZOFRAN) injection 4 mg  4 mg Intravenous Q6H PRN Clinton Sawyer A, DO      . polyethylene glycol (MIRALAX / GLYCOLAX) packet 17 g  17 g Oral Daily PRN Clinton Sawyer A, DO      . rosuvastatin (CRESTOR) tablet 10 mg  10 mg Oral Daily Clinton Sawyer A, DO   10 mg at 12/22/19 0834  . traMADol (ULTRAM) tablet 50 mg  50 mg Oral Q6H PRN Clinton Sawyer A, DO      . vitamin B-12 (CYANOCOBALAMIN) tablet 1,000 mcg  1,000 mcg Oral Daily Clinton Sawyer A, DO   1,000 mcg at 12/22/19 [id number]     Discharge Medications: Please see discharge summary for a list of discharge medications.  Relevant Imaging Results:  Relevant Lab Results:   Additional Information SS# [id number]  Bartholomew Crews, RN

## 2019-12-22 NOTE — Progress Notes (Signed)
Occupational Therapy Treatment Patient Details Name: Corey Oneill MRN: [id number] DOB: [birth year] Today's Date: 12/22/2019    History of present illness 84 y.o. male with fall 12/16/19 with left inferior pubic rami fx with aspiration PNA. PMHx: metastatic prostate cancer, chronic ITP, aortic stenosis status post TAVR, recurrent enterococcal bacteremia, CAD, GERD, HLD   OT comments  This 84 yo male admitted and underwent above presents to acute OT with making progress with overall mobility related to basic ADLs, but still with balance deficits as well as unaware (or at least not letting others know) when he is incontinent of bowel/bladder. Spoke with wife via phone who was at work and she cannot provide 24 hour S/A due to she works and thus wants to look into SNF stay--MD, CM, RN made aware. We will continue to follow.  Follow Up Recommendations  SNF;Supervision/Assistance - 24 hour          Precautions / Restrictions Precautions Precautions: Fall Precaution Comments: hx of falling Restrictions Weight Bearing Restrictions: No       Mobility Bed Mobility Overal bed mobility: Needs Assistance Bed Mobility: Supine to Sit     Supine to sit: Min guard     General bed mobility comments: increased time and effort with HOB 30 degrees  Transfers Overall transfer level: Needs assistance Equipment used: Rolling walker (2 wheeled) Transfers: Sit to/from Stand Sit to Stand: Min assist         General transfer comment: VCs for safe hand placement    Balance Overall balance assessment: Needs assistance Sitting-balance support: Feet supported;No upper extremity supported Sitting balance-Leahy Scale: Fair Sitting balance - Comments: however with dynamic task of trying to don shoes he had a posterior lean   Standing balance support: No upper extremity supported;During functional activity Standing balance-Leahy Scale: Poor Standing balance comment: standing at sink to wash hands, pt  with "sinking posture" but would self correct                           ADL either performed or assessed with clinical judgement   ADL Overall ADL's : Needs assistance/impaired                     Lower Body Dressing: Total assistance Lower Body Dressing Details (indicate cue type and reason): for bedroom slippers, pt could get his RLE started in shoe--but not finished and his LLE he could not get it started or finished as well as he kept loosing his balance posteriorly with this dynamic task Toilet Transfer: Minimal assistance;Ambulation;Comfort height toilet;Grab bars;RW   Toileting- Clothing Manipulation and Hygiene: Total assistance Toileting - Clothing Manipulation Details (indicate cue type and reason): min A sit<>stand. Pt was incontinent of urine (condom cath partially off and bowel and he was unaware)             Vision Baseline Vision/History: Wears glasses Wears Glasses: At all times Patient Visual Report: No change from baseline            Cognition Arousal/Alertness: Awake/alert Behavior During Therapy: WFL for tasks assessed/performed Overall Cognitive Status: No family/caregiver present to determine baseline cognitive functioning Area of Impairment: Safety/judgement;Problem solving                       Safety/Judgement: Decreased awareness of safety;Decreased awareness of deficits   Problem Solving: Difficulty sequencing;Requires verbal cues;Requires tactile cues General Comments: pt incontinent of stool and urine on  arrival and unaware.                   Pertinent Vitals/ Pain       Pain Assessment: No/denies pain         Frequency  Min 2X/week        Progress Toward Goals  OT Goals(current goals can now be found in the care plan section)  Progress towards OT goals: Progressing toward goals     Plan Discharge plan needs to be updated;Frequency remains appropriate       AM-PAC OT "6 Clicks" Daily Activity      Outcome Measure   Help from another person eating meals?: None Help from another person taking care of personal grooming?: A Little Help from another person toileting, which includes using toliet, bedpan, or urinal?: Total Help from another person bathing (including washing, rinsing, drying)?: A Lot Help from another person to put on and taking off regular upper body clothing?: A Little Help from another person to put on and taking off regular lower body clothing?: Total 6 Click Score: 14    End of Session Equipment Utilized During Treatment: Gait belt;Rolling walker  OT Visit Diagnosis: Unsteadiness on feet (R26.81);Other abnormalities of gait and mobility (R26.89);Muscle weakness (generalized) (M62.81);Other symptoms and signs involving cognitive function;History of falling (Z91.81)   Activity Tolerance Patient tolerated treatment well   Patient Left in bed;with call bell/phone within reach;with bed alarm set   Nurse Communication          Time: [id number] OT Time Calculation (min): 44 min  Charges: OT General Charges $OT Visit: 1 Visit OT Treatments $Self Care/Home Management : 38-52 mins  Tye [place] , OTR/L [employer] Pager [phone] Office [phone]      12/22/2019, 4:20 PM

## 2019-12-23 LAB — SARS CORONAVIRUS 2 (TAT 6-24 HRS): SARS Coronavirus 2: NEGATIVE

## 2019-12-23 NOTE — Progress Notes (Signed)
  Speech Language Pathology Treatment: Dysphagia  Patient Details Name: Corey Oneill MRN: [id number] DOB: 08-24-1936 Today's Date: 12/23/2019 Time: [id number] SLP Time Calculation (min) (ACUTE ONLY): 17 min  Assessment / Plan / Recommendation Clinical Impression  Pt seen to reinforce results of yesterdays MBS. He could not recall any recommendations from yesterdays assessment and the written sign was not found in the room. Pt again able to follow strategies with min verbal cues, no immediate signs of aspiration observed. Reiterated recommendations and provided written instructions. Also called wife to inform her of recommendations and presence of written instruction. Wife reports husband will not recall any short term information, but is agreeable to f/u at SNF. She also reports a willingness to follow oral care recommendations, but frusteration that hospital staff does not initiate oral care regularly. SLP posted sign requesting oral care and discussed with RN.   HPI HPI: Corey Oneill is a 84 y.o. 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  This was described as a mechanical fall, with him slipping on slippery floor at home, and injuring his tailbone.  He typically uses a walker to walk, had increasing difficulty and has been using a wheelchair for the last day.  He was evaluated by his PCP on 2/3, and found to be doing fairly well, though unfortunately in the intervening time he has had decreased p.o. intake and progressive weakness, culminating with an episode of frank hematuria today, prompting his daughter call EMS, to take him to the emergency department.  Reports associated back, leg and tailbone  pain.      SLP Plan  Continue with current plan of care       Recommendations  Diet recommendations: Regular;Thin liquid Liquids provided via: Straw;Cup Medication Administration: Whole meds with puree Supervision: Staff to assist with self feeding;Full supervision/cueing for compensatory strategies Compensations: Minimize environmental distractions;Slow rate;Small sips/bites;Follow solids with liquid;Hard cough after swallow;Effortful swallow Postural Changes and/or Swallow Maneuvers: Seated upright 90 degrees                Oral Care Recommendations: Oral care BID Plan: Continue with current plan of care       GO               Herbie Baltimore, MA CCC-SLP  Acute Rehabilitation Services Pager [phone] Office [phone]  Lynann Beaver 12/23/2019, 10:59 AM

## 2019-12-23 NOTE — [place] FL2 (Signed)
[place] LEVEL OF CARE SCREENING TOOL     IDENTIFICATION  Patient Name: Corey Oneill Birthdate: 05-05-1936 Sex: male Admission Date (Current Location): 12/19/2019  [facility] and [place] Number:  Herbalist and Address:  [facility] [place], [place] [address], [place], [place] [address]      Provider Number: [id number]  Attending Physician Name and Address:  Shelly Coss, MD  Relative Name and Phone Number:  [name] [phone]    Current Level of Care: Hospital Recommended Level of Care: [place] Prior Approval Number:    Date Approved/Denied: 12/23/19 PASRR Number: [id number]  Discharge Plan: SNF    Current Diagnoses: Patient Active Problem List   Diagnosis Date Noted  . Aspiration pneumonia ([place]) 12/20/2019  . Pelvic fracture ([place]) 12/19/2019  . Seizures ([place]) 11/26/2019  . Seizure ([place]) 11/26/2019  . Essential hypertension 11/25/2019  . Pressure injury of skin 11/15/2019  . TIA (transient ischemic attack) 11/15/2019  . Word finding difficulty 11/14/2019  . Transient alteration of awareness 09/25/2019  . Idiopathic thrombocytopenic purpura (ITP) (HCC) 09/25/2019  . Macrocytic anemia 09/25/2019  . Bacteremia due to Enterococcus 09/11/2019  . Bullous pemphigoid 06/12/2019  . 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 Radiation proctitis   . Hx of radiation therapy   . Heart murmur   . H/O blood clots   . Family history of adverse reaction to anesthesia   . CAD (coronary artery disease)   . Aortic stenosis   . Anginal pain ([place])   . History of ITP 02/26/2017  . 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 Malignant melanoma in situ ([place]) 09/04/2016  . 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 S/P appendectomy 11/12/2015  . Esophageal reflux 03/22/2015  . Depression 11/20/2014  . Prostate cancer ([place]) 03/05/2013  . Erectile dysfunction   .  Hyperlipidemia 06/07/2007    Orientation RESPIRATION BLADDER Height & Weight     Self, Time, Situation, Place  Normal Incontinent Weight: 59.9 kg Height:  5\' 10"  (177.8 cm)  BEHAVIORAL SYMPTOMS/MOOD NEUROLOGICAL BOWEL NUTRITION STATUS      Incontinent Diet  AMBULATORY STATUS COMMUNICATION OF NEEDS Skin   Limited Assist Verbally Bruising                       Personal Care Assistance Level of Assistance  Bathing, Feeding, Dressing Bathing Assistance: Limited assistance Feeding assistance: Limited assistance Dressing Assistance: Limited assistance     Functional Limitations Info  Sight, Hearing, Speech Sight Info: Impaired Hearing Info: Impaired Speech Info: Adequate    SPECIAL CARE FACTORS FREQUENCY  PT (By licensed PT), OT (By licensed OT)     PT Frequency: PT at SNF to eval and treat a min of 5x/week OT Frequency: OT at SNF to eval and treat a min of 5x/week            Contractures Contractures Info: Not present    Additional Factors Info  Code Status, Allergies Code Status Info: DNR Allergies Info: prednisone           Current Medications (12/23/2019):  This is the current hospital active medication list Current Facility-Administered Medications  Medication Dose Route Frequency Provider Last Rate Last Admin  . acetaminophen (TYLENOL) tablet 650 mg  650 mg Oral Q6H PRN Clinton Sawyer A, DO   650 mg at 12/21/19 1745   Or  . acetaminophen (TYLENOL) suppository 650 mg  650 mg Rectal  Q6H PRN Margart Sickles, DO      . amoxicillin-clavulanate (AUGMENTIN) 875-125 MG per tablet 1 tablet  1 tablet Oral Q12H Shelly Coss, MD   1 tablet at 12/23/19 [id number]  . aspirin EC tablet 81 mg  81 mg Oral QPM Clinton Sawyer A, DO   81 mg at 12/22/19 1820  . bisacodyl (DULCOLAX) suppository 10 mg  10 mg Rectal Daily PRN Margart Sickles, DO      . carvedilol (COREG) tablet 3.125 mg  3.125 mg Oral BID AC Clinton Sawyer A, DO   3.125 mg at 12/23/19 [id number]  . DULoxetine  (CYMBALTA) DR capsule 60 mg  60 mg Oral Daily Clinton Sawyer A, DO   60 mg at 12/23/19 [id number]  . enoxaparin (LOVENOX) injection 40 mg  40 mg Subcutaneous QHS Clinton Sawyer A, DO   40 mg at 12/22/19 2203  . enzalutamide (XTANDI) capsule 160 mg  160 mg Oral Q supper Clinton Sawyer A, DO   160 mg at 12/21/19 1746  . feeding supplement (ENSURE ENLIVE) (ENSURE ENLIVE) liquid 237 mL  237 mL Oral BID BM Osei-Bonsu, Iona Beard, MD   237 mL at 12/23/19 0920  . ferrous sulfate tablet 325 mg  325 mg Oral Q breakfast Clinton Sawyer A, DO   325 mg at 12/23/19 [id number]  . isosorbide mononitrate (IMDUR) 24 hr tablet 30 mg  30 mg Oral Daily Clinton Sawyer A, DO   30 mg at 12/23/19 [id number]  . lamoTRIgine (LAMICTAL) tablet 50 mg  50 mg Oral BID Clinton Sawyer A, DO   50 mg at 12/23/19 [id number]  . multivitamin with minerals tablet 1 tablet  1 tablet Oral Daily Osei-Bonsu, Iona Beard, MD   1 tablet at 12/23/19 [id number]  . nutrition supplement (JUVEN) (JUVEN) powder packet 1 packet  1 packet Oral BID BM Benito Mccreedy, MD   1 packet at 12/23/19 0920  . ondansetron (ZOFRAN) tablet 4 mg  4 mg Oral Q6H PRN Clinton Sawyer A, DO       Or  . ondansetron (ZOFRAN) injection 4 mg  4 mg Intravenous Q6H PRN Clinton Sawyer A, DO      . polyethylene glycol (MIRALAX / GLYCOLAX) packet 17 g  17 g Oral Daily PRN Clinton Sawyer A, DO      . rosuvastatin (CRESTOR) tablet 10 mg  10 mg Oral Daily Clinton Sawyer A, DO   10 mg at 12/23/19 [id number]  . traMADol (ULTRAM) tablet 50 mg  50 mg Oral Q6H PRN Clinton Sawyer A, DO      . vitamin B-12 (CYANOCOBALAMIN) tablet 1,000 mcg  1,000 mcg Oral Daily Clinton Sawyer A, DO   1,000 mcg at 12/23/19 [id number]     Discharge Medications: Please see discharge summary for a list of discharge medications.  Relevant Imaging Results:  Relevant Lab Results:   Additional Information SS# [id number]  Bartholomew Crews, RN

## 2019-12-23 NOTE — TOC Progression Note (Signed)
Transition of Care [facility]) - Progression Note    Patient Details  Name: VALJEAN OR MRN: [id number] Date of Birth: 11-09-1936  Transition of [facility]) CM/SW Contact  Bartholomew Crews, RN Phone Number: [phone] 12/23/2019, 12:32 PM  Clinical Narrative:    Spoke with patient's spouse on the phone to discuss bed offers - [facility] accepted. Notified [facility] to verify bed - they can accept patient tomorrow. Covid test requested. PASRR# now received and FL2 updated. Message left for spouse to advise of requested appointment for 10:30 am tomorrow at [facility] to complete admission paperwork. TOC team following for transition needs.    Expected Discharge Plan: Skilled Nursing Facility Barriers to Discharge: SNF Pending bed offer  Expected Discharge Plan and Services Expected Discharge Plan: [place]   Discharge Planning Services: CM Consult Post Acute Care Choice: [place] Living arrangements for the past 2 months: Single Family Home                 DME Arranged: N/A DME Agency: NA       HH Arranged: NA HH Agency: NA         Social Determinants of Health (SDOH) Interventions    Readmission Risk Interventions No flowsheet data found.

## 2019-12-23 NOTE — Progress Notes (Signed)
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  Imagings on presentation showed left inferior pubic ramus fracture.  Chest x-ray showed aspiration pneumonia.  Orthopedics consulted.  Plan for conservative management and outpatient follow-up.  He was started on antibiotics for aspiration pneumonia.  Currently respiratory status stable.  PT/OT recommended skilled facility.  He is hemodynamically stable for discharge to skilled nursing facility as soon as bed is available.  Assessment & Plan:   Active Problems:   Pelvic fracture (HCC)   Aspiration pneumonia (HCC)   Aspiration pneumonia: Presented with cough.Chest xray showed right upper lobe and lingular pneumonia.  He has history of aspiration and follows with outpatient speech therapy.  As per the patient, he does not comply fully with strict dietary modification and he understands the risks of respiratory complication including pneumonia.  Started on Augmentin .    Pelvic fracture/fall: Imaging showed nondisplaced left inferior pubic ramus fracture following mechanical fall.  Orthopedics was consulted.  Recommended weight bearing as tolerated with outpatient follow-up in 3 to 4 weeks with Dr. Doreatha Martin.  PT/OT recommended skilled nursing facility.  Hypokalemia: Supplemented and corrected  Moderate protein calorie malnutrition: Dietitian following.  Metastatic prostate cancer: On Xtandi.CT of his pelvis noted sclerotic lesion in the superior aspect of the left acetabulum and right iliac bone.This is new since prior CT and concerning for metastatic disease.  Outpatient follow-up with patient's oncologist on discharge.  History of recurrent enterococcal UTI/bacteremia: On indefinite amoxicillin for suppressive therapy.  History of bullous pemphigoid: On chronic doxycycline therapy.  Nutrition Problem: Inadequate oral intake Etiology: cancer and cancer related treatments, decreased appetite(metasttic prostate cancer)      DVT prophylaxis:Lovenox Code Status: DNR Family Communication: called daughter on phone on 12/22/19 Disposition Plan: Patient is from home.  PT and OT recommended skilled nursing facility.  Currently waiting for bed.DC tomorrow   Consultants: Orthopedics  Procedures: None Antimicrobials:  Anti-infectives (From admission, onward)   Start     Dose/Rate Route Frequency Ordered Stop   12/22/19 1400  amoxicillin-clavulanate (AUGMENTIN) 875-125 MG per tablet 1 tablet     1 tablet Oral Every 12 hours 12/22/19 1354     12/20/19 1200  ampicillin-sulbactam (UNASYN) 1.5 g in sodium chloride 0.9 % 100 mL IVPB  Status:  Discontinued     1.5 g 200 mL/hr over 30 Minutes Intravenous Every 6 hours 12/20/19 1105 12/22/19 1354   12/19/19 2215  doxycycline (VIBRA-TABS) tablet 100 mg  Status:  Discontinued     100 mg Oral 2 times daily 12/19/19 2205 12/22/19 1354   12/19/19 1745  cefTRIAXone (ROCEPHIN) 1 g in sodium chloride 0.9 % 100 mL IVPB     1 g 200 mL/hr over 30 Minutes Intravenous  Once 12/19/19 1735 12/19/19 2039   12/19/19 1745  azithromycin (ZITHROMAX) 500 mg in sodium chloride 0.9 % 250 mL IVPB     500 mg 250 mL/hr over 60 Minutes Intravenous  Once 12/19/19 1735 12/19/19 2039      Subjective: Patient seen and examined the bedside this morning.  Hemodynamically stable.  No active issues.  Waiting for bed in skilled nursing facility.  Objective: Vitals:   12/22/19 1426 12/22/19  2025 12/23/19 0457 12/23/19 0808  BP: 126/73 120/63 (!) 146/92 (!) 151/79  Pulse: 82 70 79 74  Resp: 14 16 16 16   Temp: (!) 97.4 F (36.3 C)  97.8 F (36.6 C) (!) 97.3 F (36.3 C) 98 F (36.7 C)  TempSrc: Oral  Oral Oral  SpO2: 99% 100% 97% 99%  Weight:      Height:        Intake/Output Summary (Last 24 hours) at 12/23/2019 1406 Last data filed at 12/23/2019 1321 Gross per 24 hour  Intake 480 ml  Output 1950 ml  Net -1470 ml   Filed Weights   12/20/19 0005  Weight: 59.9 kg    Examination:   General exam: Appears calm and comfortable ,Not in distress,average built Respiratory system: Bilateral equal air entry, normal vesicular breath sounds, no wheezes or crackles  Cardiovascular system: S1 & S2 heard, RRR. No JVD, murmurs, rubs, gallops or clicks. Gastrointestinal system: Abdomen is nondistended, soft and nontender. No organomegaly or masses felt. Normal bowel sounds heard. Central nervous system: Alert and oriented. No focal neurological deficits. Extremities: No edema, no clubbing ,no cyanosis, Skin: No rashes, lesions or ulcers,no icterus ,no pallor    Data Reviewed: I have personally reviewed following labs and imaging studies  CBC: Recent Labs  Lab 12/19/19 1601 12/20/19 0047 12/21/19 0407 12/22/19 0301  WBC 7.7 4.6 3.9* 4.0  NEUTROABS 6.1 3.2 2.3 2.2  HGB 11.3* 10.0* 9.9* 9.7*  HCT 32.1* 29.7* 29.1* 28.2*  MCV 94.1 95.8 95.7 94.9  PLT PLATELET CLUMPS NOTED ON SMEAR, COUNT APPEARS DECREASED 81* 87* 98*   Basic Metabolic Panel: Recent Labs  Lab 12/19/19 1601 12/20/19 0047 12/21/19 0407 12/22/19 0301  NA 131* 133* 135 133*  K 4.0 3.2* 4.1 4.1  CL 95* 97* 100 101  CO2 24 23 27 25   GLUCOSE 93 110* 98 95  BUN 12 10 13  27*  CREATININE 0.75 0.69 0.66 0.79  CALCIUM 8.4* 8.1* 8.1* 8.2*   GFR: Estimated Creatinine Clearance: 61.4 mL/min (by C-G formula based on SCr of 0.79 mg/dL). Liver Function Tests: Recent Labs  Lab 12/19/19 1601 12/21/19 0407  AST 17 15  ALT 10 9  ALKPHOS 74 63  BILITOT 0.8 0.8  PROT 6.4* 5.5*  ALBUMIN 2.8* 2.4*   No results for input(s): LIPASE, AMYLASE in the  last 168 hours. No results for input(s): AMMONIA in the last 168 hours. Coagulation Profile: No results for input(s): INR, PROTIME in the last 168 hours. Cardiac Enzymes: No results for input(s): CKTOTAL, CKMB, CKMBINDEX, TROPONINI in the last 168 hours. BNP (last 3 results) No results for input(s): PROBNP in the last 8760 hours. HbA1C: No results for input(s): HGBA1C in the last 72 hours. CBG: No results for input(s): GLUCAP in the last 168 hours. Lipid Profile: No results for input(s): CHOL, HDL, LDLCALC, TRIG, CHOLHDL, LDLDIRECT in the last 72 hours. Thyroid Function Tests: No results for input(s): TSH, T4TOTAL, FREET4, T3FREE, THYROIDAB in the last 72 hours. Anemia Panel: No results for input(s): VITAMINB12, FOLATE, FERRITIN, TIBC, IRON, RETICCTPCT in the last 72 hours. Sepsis Labs: Recent Labs  Lab 12/19/19 1602 12/20/19 0047  PROCALCITON  --  <0.10  LATICACIDVEN 1.3  --     Recent Results (from the past 240 hour(s))  Urine culture     Status: None   Collection Time: 12/19/19  3:27 PM   Specimen: Urine, Random  Result Value Ref Range Status   Specimen Description URINE, RANDOM  Final  Special Requests NONE  Final   Culture   Final    NO GROWTH Performed at [facility], [place] [address] [place], [place] [address]    Report Status 12/21/2019 FINAL  Final  Culture, blood (routine x 2)     Status: None (Preliminary result)   Collection Time: 12/19/19  6:16 PM   Specimen: BLOOD  Result Value Ref Range Status   Specimen Description BLOOD LEFT ANTECUBITAL  Final   Special Requests   Final    BOTTLES DRAWN AEROBIC AND ANAEROBIC Blood Culture adequate volume   Culture   Final    NO GROWTH 4 DAYS Performed at [facility], [place] [address], [place], [place] [address]    Report Status PENDING  Incomplete  Respiratory Panel by RT PCR (Flu A&B, Covid) - Nasopharyngeal Swab     Status: None   Collection Time: 12/19/19  6:21 PM   Specimen: Nasopharyngeal Swab    Result Value Ref Range Status   SARS Coronavirus 2 by RT PCR NEGATIVE NEGATIVE Final    Comment: (NOTE) SARS-CoV-2 target nucleic acids are NOT DETECTED. The SARS-CoV-2 RNA is generally detectable in upper respiratoy specimens during the acute phase of infection. The lowest concentration of SARS-CoV-2 viral copies this assay can detect is 131 copies/mL. A negative result does not preclude SARS-Cov-2 infection and should not be used as the sole basis for treatment or other patient management decisions. A negative result may occur with  improper specimen collection/handling, submission of specimen other than nasopharyngeal swab, presence of viral mutation(s) within the areas targeted by this assay, and inadequate number of viral copies (<131 copies/mL). A negative result must be combined with clinical observations, patient history, and epidemiological information. The expected result is Negative. Fact Sheet for Patients:  PinkCheek.be Fact Sheet for Healthcare Providers:  GravelBags.it This test is not yet ap proved or cleared by the [place] FDA and  has been authorized for detection and/or diagnosis of SARS-CoV-2 by FDA under an Emergency Use Authorization (EUA). This EUA will remain  in effect (meaning this test can be used) for the duration of the COVID-19 declaration under Section 564(b)(1) of the Act, 21 U.S.C. section 360bbb-3(b)(1), unless the authorization is terminated or revoked sooner.    Influenza A by PCR NEGATIVE NEGATIVE Final   Influenza B by PCR NEGATIVE NEGATIVE Final    Comment: (NOTE) The Xpert Xpress SARS-CoV-2/FLU/RSV assay is intended as an aid in  the diagnosis of influenza from Nasopharyngeal swab specimens and  should not be used as a sole basis for treatment. Nasal washings and  aspirates are unacceptable for Xpert Xpress SARS-CoV-2/FLU/RSV  testing. Fact Sheet for  Patients: PinkCheek.be Fact Sheet for Healthcare Providers: GravelBags.it This test is not yet approved or cleared by the [place] FDA and  has been authorized for detection and/or diagnosis of SARS-CoV-2 by  FDA under an Emergency Use Authorization (EUA). This EUA will remain  in effect (meaning this test can be used) for the duration of the  Covid-19 declaration under Section 564(b)(1) of the Act, 21  U.S.C. section 360bbb-3(b)(1), unless the authorization is  terminated or revoked. Performed at [facility], [place] [address], [place], [place] [address]   Culture, blood (routine x 2)     Status: None (Preliminary result)   Collection Time: 12/20/19 12:56 AM   Specimen: BLOOD RIGHT FOREARM  Result Value Ref Range Status   Specimen Description BLOOD RIGHT FOREARM  Final   Special  Requests   Final    BOTTLES DRAWN AEROBIC AND ANAEROBIC Blood Culture adequate volume   Culture   Final    NO GROWTH 3 DAYS Performed at [facility], [place] [address] [place], [place] [address]    Report Status PENDING  Incomplete         Radiology Studies: DG Swallowing Func-Speech Pathology  Result Date: 12/22/2019 Objective Swallowing Evaluation: Type of Study: MBS-Modified Barium Swallow Study  Completed and documented by Lenore Manner, SLP Student Supervised by Herbie Baltimore, MA [place] [facility] Pager [phone] Office [phone] Patient Details Name: JAHKING SOHAL MRN: [id number] Date of Birth: [birth year] Today's Date: 12/22/2019 Time: SLP Start Time (ACUTE ONLY): 0900 -SLP Stop Time (ACUTE ONLY): 0923 SLP Time Calculation (min) (ACUTE ONLY): 23 min Past Medical History: Past Medical History: Diagnosis Date . Abdominal aortic atherosclerosis ([place])  . Anginal pain ([place])   last noted 08/17/19 . Bone cancer ([place])   Left hip . BPPV (benign paroxysmal positional vertigo)  . Bullous pemphigoid  . CAD (coronary artery  disease)   a.  LHC 8/16: Mid to distal LAD 30%, OM1 40%, proximal mid RCA 40%, distal RCA 60% >> FFR 0.69  >> PCI: 3 x 15 mm Resolute DES . Depression  . Esophageal reflux  . Grade I diastolic dysfunction [id number]  Noted on ECHO  . H/O blood clots   "get them in my stool and urine" (11/12/2015) . Heart murmur  . Hepatic lesion 12/08/2015  Stable 8 mm right hepatic lesion . History of aortic stenosis   a. peak to peak gradient by LHC 8/16:  37 mmHg (moderately severe)  . History of appendicitis 2016 . History of blood transfusion 12/2018 . History of herpes labialis  . History of ITP   2018, thrombocytopenia . History of kidney stones  . Hx of radiation therapy 04/14/13-06/09/13  prostate 7800 cGy, 40 sessions, seminal vesicles 5600 cGy 40 sessions . Hydrocele 2006  Small . Hyperlipidemia  . Insomnia   resolved . LVH (left ventricular hypertrophy)   Moderate, noted on ECHO 09/2018 . Malignant melanoma in situ ([place]) 09/04/2016  Right neck and chest . Metastasis from malignant neoplasm of prostate ([place]) 02/25/2019 . Prostate cancer ([place]) dx'd 2014 . Radiation proctitis  . Renal mass 2017  Bilateral renal masses . S/P skin biopsy 04/09/2019  subepidermal cell poor vesicle , Linear IGG and C3 the basement membrane . Suprapubic catheter ([place])  . Wears hearing aid in both ears  Past Surgical History: Past Surgical History: Procedure Laterality Date . CARDIAC CATHETERIZATION N/A 04/21/2015  Procedure: Right/Left Heart Cath and Coronary Angiography;  Surgeon: [name], MD;  Location: [place] [facility];  Service: Cardiovascular;  Laterality: N/A; . CARDIAC CATHETERIZATION N/A 06/18/2015  Procedure: Intravascular Pressure Wire/FFR Study;  Surgeon: [name], MD;  Location: [place];  Service: Cardiovascular;  Laterality: N/A; . CARDIAC CATHETERIZATION N/A 06/18/2015  Procedure: Coronary Stent Intervention;  Surgeon: [name], MD;  Location: [place] [facility];  Service: Cardiovascular;  Laterality: N/A; .  CARDIAC CATHETERIZATION N/A 06/18/2015  Procedure: Right/Left Heart Cath and Coronary Angiography;  Surgeon: [name], MD;  Location: [facility];  Service: Cardiovascular;  Laterality: N/A; . CARDIAC CATHETERIZATION N/A 06/23/2015  Procedure: Left Heart Cath and Cors/Grafts Angiography;  Surgeon: [name], MD;  Location: [facility] [facility];  Service: Cardiovascular;  Laterality: N/A; . CARDIAC CATHETERIZATION N/A 01/17/2016  Procedure: Right/Left Heart Cath and Coronary Angiography;  Surgeon: [name], MD;  Location: [place] [facility];  Service: Cardiovascular;  Laterality: N/A; . CATARACT EXTRACTION, BILATERAL   . COLONOSCOPY  06/26/2017 . CYSTOSCOPY WITH BIOPSY N/A 07/30/2018  Procedure: CYSTOSCOPY WITH BIOPSY/FULGURATION, CYSTOLTHOLAPAXY;  Surgeon: [name], MD;  Location: [facility];  Service: Urology;  Laterality: N/A; . CYSTOSCOPY WITH URETHRAL DILATATION N/A 08/18/2019  Procedure: CYSTOSCOPY WITH URETHRAL DILATATION USING BALLOON OR LASER/ SPURO PUBIC TUBE CHANGE LASER EXCISION OF URETHRAL STRICTURE RETROGRADE URETHROGRAM ANTEGRADE CYSTOGRAM;  Surgeon: [name], MD;  Location: [facility];  Service: Urology;  Laterality: N/A; . FRACTURE SURGERY   . INGUINAL HERNIA REPAIR    patient does not remember this procedure . LAPAROSCOPIC APPENDECTOMY N/A 11/16/2015  Procedure: APPENDECTOMY LAPAROSCOPIC;  Surgeon: [name], MD;  Location: [place];  Service: General;  Laterality: N/A; . LEFT HEART CATH AND CORONARY ANGIOGRAPHY N/A 12/26/2016  Procedure: Left Heart Cath and Coronary Angiography;  Surgeon: Troy Sine, MD;  Location: [facility];  Service: Cardiovascular;  Laterality: N/A; . MELANOMA EXCISION Right 10/24/2016  Procedure: WIDE EXCISION MELANOMA RIGHT NECK AND RIGHT CHEST TIMES 2;  Surgeon: [name], MD;  Location: [place];  Service: General;  Laterality: Right;  right medial and lateral lesion of chest and right neck . NASAL  FRACTURE SURGERY    "broken years ago; several ORs to correct it" . NASAL SEPTUM SURGERY   . PROSTATE BIOPSY  2014  "needle biopsy" . TEE WITHOUT CARDIOVERSION N/A 02/15/2016  Procedure: TRANSESOPHAGEAL ECHOCARDIOGRAM (TEE);  Surgeon: [name], MD;  Location: [facility];  Service: Open Heart Surgery;  Laterality: N/A; . TEE WITHOUT CARDIOVERSION N/A 08/26/2019  Procedure: TRANSESOPHAGEAL ECHOCARDIOGRAM (TEE);  Surgeon: Pixie Casino, MD;  Location: [facility];  Service: Cardiovascular;  Laterality: N/A; . TONSILLECTOMY   . TRANSCATHETER AORTIC VALVE REPLACEMENT, TRANSFEMORAL  02/15/2016 . TRANSCATHETER AORTIC VALVE REPLACEMENT, TRANSFEMORAL N/A 02/15/2016  Procedure: TRANSCATHETER AORTIC VALVE REPLACEMENT, TRANSFEMORAL;  Surgeon: [name], MD;  Location: [place];  Service: Open Heart Surgery;  Laterality: N/A; . TRANSURETHRAL RESECTION OF PROSTATE  12/23/2018  Procedure: CYSTOSCOPY WITH  BLADDER BIOPSY WITH FULGERATION/ TRANSURETHRAL RESECTION PROSTATE  AND PROSTATE BIOPSY;  Surgeon: [name], MD;  Location: [facility];  Service: Urology;; HPI: Corey Oneill is a 84 y.o. 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  This was described as a mechanical fall, with him slipping on slippery floor at home, and injuring his tailbone.  He typically uses a walker to walk, had increasing difficulty and has been using a wheelchair for the last day.  He was evaluated by his PCP on 2/3, and found to be doing fairly well, though unfortunately in the intervening time he has had decreased p.o. intake and progressive weakness, culminating with an episode of frank hematuria today, prompting his daughter call EMS, to take him  to the emergency department.  Reports associated back, leg and tailbone pain.  Subjective: alert, upright in chair Assessment / Plan / Recommendation CHL IP CLINICAL IMPRESSIONS 12/22/2019 Clinical Impression Paitient presents with moderate oropharyngeal dysphagia across all consistencies. Oral phase was remarkable for decreased bolus cohesion and reduced lingual control resulting in premature spillage to the vallecula. Pharyngeal phase was remarkable for reduced BOT retraction and epiglottic inversion, reduced hyolaryngeal excursion  resulting in vallecular and pyriform sinus residue, and reduced laryngeal closure resulting in silent aspiration. Trace aspiration occured with thin liquids during the swallow (without compensatory strategies), and pt was unable to clear aspirate from airway despite Max cues from clinician d/t weak cough and reduced laryngeal strength. SLP cued pt to use effortful swallow, but conducted the supraglottic swallow instead. This ultimately prevented the pt from aspirating by increasing the pt's overall laryngeal closure. Significant residue was observed in vallecula following puree and solid conistencies, but reduced if pt utilized a liquid wash with strategies. Recommened regular diet with thin liquids, using the super supra glottic swallow with thin liquids in order to maintain the least restrictive diet. Pt should have full supervision via staff while eating and drinking. Pt should also have strict oral care, 3x/day in order to decrease oropharyngeal bacteria. SLP will further educate the pt's wife on results from [facility], how to guide/supervise the pt while using the compensatory strategy, and the importance of strict oral care. SLP Visit Diagnosis Dysphagia, oropharyngeal phase (R13.12) Attention and concentration deficit following -- Frontal lobe and executive function deficit following -- Impact on safety and function Moderate aspiration risk   CHL IP TREATMENT RECOMMENDATION 12/22/2019  Treatment Recommendations Therapy as outlined in treatment plan below   Prognosis 12/22/2019 Prognosis for Safe Diet Advancement Good Barriers to Reach Goals -- Barriers/Prognosis Comment -- CHL IP DIET RECOMMENDATION 12/22/2019 SLP Diet Recommendations Regular solids;Thin liquid Liquid Administration via Straw Medication Administration Crushed with puree Compensations Minimize environmental distractions;Slow rate;Small sips/bites;Follow solids with liquid;Clear throat after each swallow;Effortful swallow Postural Changes Remain semi-upright after after feeds/meals (Comment);Seated upright at 90 degrees   CHL IP OTHER RECOMMENDATIONS 12/22/2019 Recommended Consults -- Oral Care Recommendations Oral care BID Other Recommendations --   CHL IP FOLLOW UP RECOMMENDATIONS 12/22/2019 Follow up Recommendations Other (comment)   CHL IP FREQUENCY AND DURATION 12/22/2019 Speech Therapy Frequency (ACUTE ONLY) min 2x/week Treatment Duration 2 weeks      CHL IP ORAL PHASE 12/22/2019 Oral Phase Impaired Oral - Pudding Teaspoon -- Oral - Pudding Cup -- Oral - Honey Teaspoon -- Oral - Honey Cup -- Oral - Nectar Teaspoon -- Oral - Nectar Cup WFL Oral - Nectar Straw -- Oral - Thin Teaspoon -- Oral - Thin Cup Premature spillage;Decreased bolus cohesion Oral - Thin Straw Decreased bolus cohesion;Premature spillage Oral - Puree WFL Oral - Mech Soft -- Oral - Regular WFL Oral - Multi-Consistency -- Oral - Pill -- Oral Phase - Comment --  CHL IP PHARYNGEAL PHASE 12/22/2019 Pharyngeal Phase Impaired Pharyngeal- Pudding Teaspoon -- Pharyngeal -- Pharyngeal- Pudding Cup -- Pharyngeal -- Pharyngeal- Honey Teaspoon -- Pharyngeal -- Pharyngeal- Honey Cup -- Pharyngeal -- Pharyngeal- Nectar Teaspoon -- Pharyngeal -- Pharyngeal- Nectar Cup Pharyngeal residue - valleculae;Pharyngeal residue - pyriform;Reduced tongue base retraction;Reduced laryngeal elevation Pharyngeal -- Pharyngeal- Nectar Straw -- Pharyngeal -- Pharyngeal- Thin Teaspoon -- Pharyngeal --  Pharyngeal- Thin Cup Penetration/Aspiration during swallow;Reduced airway/laryngeal closure;Trace aspiration;Reduced epiglottic inversion;Pharyngeal residue - pyriform;Pharyngeal residue - valleculae;Reduced tongue base retraction Pharyngeal Material enters airway, passes BELOW cords without attempt by patient to eject out (silent aspiration) Pharyngeal- Thin Straw Compensatory strategies attempted (with notebox);Penetration/Aspiration during swallow;Reduced airway/laryngeal closure;Reduced tongue base retraction;Pharyngeal residue - valleculae;Pharyngeal residue - pyriform Pharyngeal Material enters airway, CONTACTS cords and not ejected out Pharyngeal- Puree Pharyngeal residue - valleculae;Pharyngeal residue - pyriform;Reduced tongue base retraction;Reduced laryngeal elevation Pharyngeal -- Pharyngeal- Mechanical Soft -- Pharyngeal -- Pharyngeal- Regular Pharyngeal residue - valleculae;Pharyngeal residue - pyriform;Reduced laryngeal elevation;Reduced tongue base retraction  Pharyngeal -- Pharyngeal- Multi-consistency -- Pharyngeal -- Pharyngeal- Pill -- Pharyngeal -- Pharyngeal Comment --  CHL IP CERVICAL ESOPHAGEAL PHASE 12/22/2019 Cervical Esophageal Phase WFL Pudding Teaspoon -- Pudding Cup -- Honey Teaspoon -- Honey Cup -- Nectar Teaspoon -- Nectar Cup -- Nectar Straw -- Thin Teaspoon -- Thin Cup -- Thin Straw -- Puree -- Mechanical Soft -- Regular -- Multi-consistency -- Pill -- Cervical Esophageal Comment -- Supervised by Lynann Beaver 12/22/2019, 11:19 AM                   Scheduled Meds: . amoxicillin-clavulanate  1 tablet Oral Q12H  . aspirin EC  81 mg Oral QPM  . carvedilol  3.125 mg Oral BID AC  . DULoxetine  60 mg Oral Daily  . enoxaparin (LOVENOX) injection  40 mg Subcutaneous QHS  . enzalutamide  160 mg Oral Q supper  . feeding supplement (ENSURE ENLIVE)  237 mL Oral BID BM  . ferrous sulfate  325 mg Oral Q breakfast  . isosorbide mononitrate  30 mg Oral Daily  . lamoTRIgine   50 mg Oral BID  . multivitamin with minerals  1 tablet Oral Daily  . nutrition supplement (JUVEN)  1 packet Oral BID BM  . rosuvastatin  10 mg Oral Daily  . cyanocobalamin  1,000 mcg Oral Daily   Continuous Infusions:    LOS: 3 days    Time spent: 25 mins.More than 50% of that time was spent in counseling and/or coordination of care.      Shelly Coss, MD [facility] P2/07/2020, 2:06 PM

## 2019-12-24 DIAGNOSIS — R2689 Other abnormalities of gait and mobility: Secondary | ICD-10-CM | POA: Diagnosis not present

## 2019-12-24 DIAGNOSIS — S32502D Unspecified fracture of left pubis, subsequent encounter for fracture with routine healing: Secondary | ICD-10-CM | POA: Diagnosis not present

## 2019-12-24 DIAGNOSIS — E440 Moderate protein-calorie malnutrition: Secondary | ICD-10-CM | POA: Diagnosis not present

## 2019-12-24 DIAGNOSIS — E785 Hyperlipidemia, unspecified: Secondary | ICD-10-CM | POA: Diagnosis not present

## 2019-12-24 DIAGNOSIS — R1319 Other dysphagia: Secondary | ICD-10-CM | POA: Diagnosis not present

## 2019-12-24 DIAGNOSIS — R531 Weakness: Secondary | ICD-10-CM | POA: Diagnosis not present

## 2019-12-24 DIAGNOSIS — Z20828 Contact with and (suspected) exposure to other viral communicable diseases: Secondary | ICD-10-CM | POA: Diagnosis not present

## 2019-12-24 DIAGNOSIS — S32592D Other specified fracture of left pubis, subsequent encounter for fracture with routine healing: Secondary | ICD-10-CM | POA: Diagnosis not present

## 2019-12-24 DIAGNOSIS — R569 Unspecified convulsions: Secondary | ICD-10-CM | POA: Diagnosis not present

## 2019-12-24 DIAGNOSIS — D509 Iron deficiency anemia, unspecified: Secondary | ICD-10-CM | POA: Diagnosis not present

## 2019-12-24 DIAGNOSIS — S329XXA Fracture of unspecified parts of lumbosacral spine and pelvis, initial encounter for closed fracture: Secondary | ICD-10-CM | POA: Diagnosis not present

## 2019-12-24 DIAGNOSIS — R1312 Dysphagia, oropharyngeal phase: Secondary | ICD-10-CM | POA: Diagnosis not present

## 2019-12-24 DIAGNOSIS — Z7401 Bed confinement status: Secondary | ICD-10-CM | POA: Diagnosis not present

## 2019-12-24 DIAGNOSIS — S3289XA Fracture of other parts of pelvis, initial encounter for closed fracture: Secondary | ICD-10-CM | POA: Diagnosis not present

## 2019-12-24 DIAGNOSIS — F322 Major depressive disorder, single episode, severe without psychotic features: Secondary | ICD-10-CM | POA: Diagnosis not present

## 2019-12-24 DIAGNOSIS — R278 Other lack of coordination: Secondary | ICD-10-CM | POA: Diagnosis not present

## 2019-12-24 DIAGNOSIS — K219 Gastro-esophageal reflux disease without esophagitis: Secondary | ICD-10-CM | POA: Diagnosis not present

## 2019-12-24 DIAGNOSIS — R2681 Unsteadiness on feet: Secondary | ICD-10-CM | POA: Diagnosis not present

## 2019-12-24 DIAGNOSIS — L120 Bullous pemphigoid: Secondary | ICD-10-CM | POA: Diagnosis not present

## 2019-12-24 DIAGNOSIS — C7951 Secondary malignant neoplasm of bone: Secondary | ICD-10-CM | POA: Diagnosis not present

## 2019-12-24 DIAGNOSIS — C61 Malignant neoplasm of prostate: Secondary | ICD-10-CM | POA: Diagnosis not present

## 2019-12-24 DIAGNOSIS — I959 Hypotension, unspecified: Secondary | ICD-10-CM | POA: Diagnosis not present

## 2019-12-24 DIAGNOSIS — W19XXXD Unspecified fall, subsequent encounter: Secondary | ICD-10-CM | POA: Diagnosis not present

## 2019-12-24 DIAGNOSIS — I10 Essential (primary) hypertension: Secondary | ICD-10-CM | POA: Diagnosis not present

## 2019-12-24 DIAGNOSIS — I2510 Atherosclerotic heart disease of native coronary artery without angina pectoris: Secondary | ICD-10-CM | POA: Diagnosis not present

## 2019-12-24 DIAGNOSIS — J690 Pneumonitis due to inhalation of food and vomit: Secondary | ICD-10-CM | POA: Diagnosis not present

## 2019-12-24 DIAGNOSIS — F329 Major depressive disorder, single episode, unspecified: Secondary | ICD-10-CM | POA: Diagnosis not present

## 2019-12-24 DIAGNOSIS — M2550 Pain in unspecified joint: Secondary | ICD-10-CM | POA: Diagnosis not present

## 2019-12-24 DIAGNOSIS — I350 Nonrheumatic aortic (valve) stenosis: Secondary | ICD-10-CM | POA: Diagnosis not present

## 2019-12-24 DIAGNOSIS — N3020 Other chronic cystitis without hematuria: Secondary | ICD-10-CM | POA: Diagnosis not present

## 2019-12-24 LAB — CULTURE, BLOOD (ROUTINE X 2)
Culture: NO GROWTH
Special Requests: ADEQUATE

## 2019-12-24 MED ORDER — TRAMADOL HCL 50 MG PO TABS: 50.0000 mg | ORAL_TABLET | Freq: Four times a day (QID) | ORAL | 0 refills | Status: AC | PRN

## 2019-12-24 MED ORDER — POLYETHYLENE GLYCOL 3350 17 G PO PACK: 17.0000 g | PACK | Freq: Every day | ORAL | 0 refills | Status: AC | PRN

## 2019-12-24 NOTE — Progress Notes (Addendum)
Attempt to call report to facility. Left call back number for nurse to call unit. Patient leaving unit via PTAR.  Received call from [place], report given.

## 2019-12-24 NOTE — Consult Note (Addendum)
   [facility] CM Inpatient Consult   12/24/2019  Corey Oneill 1936/09/22 [id number]    Patient checked for a 30 day readmission with 36% extreme high risk score of unplanned readmission, 5 hospitalizations and an ED visit in the past 6 months, under his Medicare/ [employer] insurance benefit; and to check for [facility]) care management needs.  Primary care provider: [name], MD with [facility].  Brief chart review shows PT/ OT recommendations and transition of care CM notes stating that patient transitioning to skilled nursing facility [facility]) for short-term rehab.   No [facility] Care Management needs identified at thispointfor post hospital follow-up aspatient's care will be met at the skilled level of care.   Plan: [facility] post acute RN coordinator will be made aware of patient's discharge disposition for post discharge follow up of needs.   For questions, please call:  Edwena Felty A. Fryda Molenda, BSN, RN-BC [facility] Liaison Cell: [phone]

## 2019-12-24 NOTE — TOC Transition Note (Signed)
Transition of Care [facility]) - CM/SW Discharge Note   Patient Details  Name: Corey Oneill MRN: [id number] Date of Birth: Feb 03, 1936  Transition of Care [facility]) CM/SW Contact:  Bartholomew Crews, RN Phone Number: [phone] 12/24/2019, 11:13 AM   Clinical Narrative:    Spoke with patient's spouse, [name], on the phone this morning. She was at [facility] completing admission paperwork. Discussed that NCM would call [facility] to transport patient to [facility] later this morning when DC paperwork complete. [name] verbalized understanding.   DC summary and transfer report sent to [facility]. Patient will go to room 3234. The nurse may call report to the main number at [phone].   [facility] to be arranged. Medical transport paperwork on chart.   No further TOC needs identified at this time.   Final next level of care: Skilled Nursing Facility Barriers to Discharge: No Barriers Identified   Patient Goals and CMS Choice Patient states their goals for this hospitalization and ongoing recovery are:: rehab CMS Medicare.gov Compare Post Acute Care list provided to:: Patient Represenative (must comment)(Frances Rockefeller (spouse)) Choice offered to / list presented to : Spouse  Discharge Placement PASRR number recieved: 12/23/19            Patient chooses bed at: [facility] Patient to be transferred to facility by: [place] Name of family member notified: Kamarian Cashell Patient and family notified of of transfer: 12/24/19  Discharge Plan and Services   Discharge Planning Services: CM Consult Post Acute Care Choice: [facility]          DME Arranged: N/A DME Agency: NA       HH Arranged: NA HH Agency: NA        Social Determinants of Health (SDOH) Interventions     Readmission Risk Interventions No flowsheet data found.

## 2019-12-24 NOTE — Progress Notes (Signed)
Physical Therapy Treatment Patient Details Name: Corey Oneill MRN: [id number] DOB: 05-23-1936 Today's Date: 12/24/2019    History of Present Illness 84 y.o. male with fall 12/16/19 with left inferior pubic rami fx with aspiration PNA. PMHx: metastatic prostate cancer, chronic ITP, aortic stenosis status post TAVR, recurrent enterococcal bacteremia, CAD, GERD, HLD    PT Comments    Pt in bed upon arrival of PT, agreeable to PT session at this time with focus on progression of balance activities. The pt continues to present with limitations in functional mobility, power, and standing balance compared to his prior level of function and independence due to above dx. The pt's course is further complicated by STM deficits at this time. The pt will continue to benefit from skilled PT to progress safety with sit-stand transfers (currently with strong posterior lean requiring minA and verbal cues to correct), as well as ambulation endurance.     Follow Up Recommendations  SNF;Supervision/Assistance - 24 hour(pt currently needs 24/7 supervision, and per family, they cannot provide this at this time.)     Equipment Recommendations  None recommended by PT    Recommendations for Other Services       Precautions / Restrictions Precautions Precautions: Fall Precaution Comments: hx of falling Restrictions Weight Bearing Restrictions: No    Mobility  Bed Mobility Overal bed mobility: Needs Assistance Bed Mobility: Supine to Sit     Supine to sit: Supervision     General bed mobility comments: pt able to move without assist, with HOB 30 degrees  Transfers Overall transfer level: Needs assistance Equipment used: Rolling walker (2 wheeled) Transfers: Sit to/from Stand Sit to Stand: Min assist         General transfer comment: VCs for safe hand placement and forward lean with stand. pt with strong post lean upon stand, VCs and multiple reps of rocking forward/hip lift to facilitate  anterior momentum  Ambulation/Gait Ambulation/Gait assistance: Min assist Gait Distance (Feet): 30 Feet Assistive device: Rolling walker (2 wheeled) Gait Pattern/deviations: Step-through pattern;Decreased weight shift to left;Decreased stride length;Leaning posteriorly Gait velocity: slowed Gait velocity interpretation: <1.8 ft/sec, indicate of risk for recurrent falls General Gait Details: pt with strong post lean upon initial stand and with fatigue during short ambulation after standing peri-care. cues for positioning in RW   Stairs             Wheelchair Mobility    Modified Rankin (Stroke Patients Only)       Balance Overall balance assessment: Needs assistance Sitting-balance support: Feet supported;No upper extremity supported Sitting balance-Leahy Scale: Fair Sitting balance - Comments: sig R/post lean with static sitting. The pt can correct, but is unable to maintain without cues or UE support. Postural control: Posterior lean;Right lateral lean Standing balance support: During functional activity;Bilateral upper extremity supported Standing balance-Leahy Scale: Poor Standing balance comment: pt with strong post lean requiring modA, BUE support, and verbal cues to come to upright. was then able to ambulate with BUE support and minG                            Cognition Arousal/Alertness: Awake/alert Behavior During Therapy: WFL for tasks assessed/performed Overall Cognitive Status: No family/caregiver present to determine baseline cognitive functioning Area of Impairment: Safety/judgement;Problem solving                     Memory: Decreased short-term memory;Decreased recall of precautions   Safety/Judgement: Decreased awareness of  safety;Decreased awareness of deficits   Problem Solving: Difficulty sequencing;Requires verbal cues;Requires tactile cues General Comments: pt incontinent of stool and urine on arrival, reports he knew he had  had a BM in bed but did not know he could call anyone. RN staff was present and stated he was told multiple times overnight that he must call RN staff for BM to protect his skin. The pt was agreeable to education.      Exercises General Exercises - Lower Extremity Long Arc Quad: AROM;Both;10 reps;Seated Heel Raises: AROM;Both;20 reps;Seated Mini-Sqauts: 10 reps;Seated;Strengthening    General Comments        Pertinent Vitals/Pain Pain Assessment: Faces Faces Pain Scale: Hurts a little bit Pain Location: L hip  Pain Descriptors / Indicators: Discomfort;Grimacing;Guarding Pain Intervention(s): Limited activity within patient's tolerance;Monitored during session    Home Living                      Prior Function            PT Goals (current goals can now be found in the care plan section) Acute Rehab PT Goals Patient Stated Goal: to get back home PT Goal Formulation: With patient Time For Goal Achievement: 01/03/20 Potential to Achieve Goals: Good Progress towards PT goals: Progressing toward goals    Frequency    Min 3X/week      PT Plan Discharge plan needs to be updated    Co-evaluation              AM-PAC PT "6 Clicks" Mobility   Outcome Measure  Help needed turning from your back to your side while in a flat bed without using bedrails?: None Help needed moving from lying on your back to sitting on the side of a flat bed without using bedrails?: A Little Help needed moving to and from a bed to a chair (including a wheelchair)?: A Little Help needed standing up from a chair using your arms (e.g., wheelchair or bedside chair)?: A Little Help needed to walk in hospital room?: A Little Help needed climbing 3-5 steps with a railing? : A Lot 6 Click Score: 18    End of Session Equipment Utilized During Treatment: Gait belt Activity Tolerance: Patient tolerated treatment well Patient left: with call bell/phone within reach;in chair;with chair alarm  set Nurse Communication: Mobility status(cog/STM deficits) PT Visit Diagnosis: Other abnormalities of gait and mobility (R26.89);Muscle weakness (generalized) (M62.81);History of falling (Z91.81)     Time: [id number] PT Time Calculation (min) (ACUTE ONLY): 34 min  Charges:  $Gait Training: 8-22 mins $Therapeutic Exercise: 8-22 mins                     Karma Ganja, PT, DPT   Acute Rehabilitation Department Pager #: [phone]   Otho Bellows 12/24/2019, 12:16 PM

## 2019-12-24 NOTE — Discharge Summary (Addendum)
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  Imagings on presentation showed left inferior pubic ramus fracture.  Chest x-ray showed aspiration pneumonia.  Orthopedics consulted.  Plan for conservative management and outpatient follow-up.  He was started on antibiotics for aspiration pneumonia.  Currently respiratory status stable.  PT/OT recommended skilled facility.   Following problems were addressed during his hospitalization:  Aspiration pneumonia: Presented with cough.Chest xray showed right upper lobe and lingular pneumonia.  He has history of aspiration and follows with outpatient speech therapy.  As per the patient, he does not comply fully with strict dietary modification and he understands the risks of respiratory complication including pneumonia.  He was treated with Unasyn which was changed to Augmentin .  Finished abx course.  Currently respiratory status is stable.  Pelvic fracture/fall: Imaging showed nondisplaced left inferior pubic ramus fracture following mechanical fall.  Orthopedics was consulted.  Recommended weight bearing as tolerated with outpatient follow-up in 3 to 4 weeks with Dr. Doreatha Martin.  PT/OT recommended skilled nursing facility.  Hypokalemia: Supplemented and corrected  Moderate protein calorie malnutrition: Dietitian was following.  Metastatic prostate cancer: On Xtandi.CT of his  pelvis noted sclerotic lesion in the superior aspect of the left acetabulum and right iliac bone.This is new since prior CT and concerning for metastatic disease. Outpatient follow-up with patient's oncologist on discharge.  History of recurrent enterococcal UTI/bacteremia: On indefinite amoxicillin for suppressive therapy.  History of bullous pemphigoid: On chronic doxycycline therapy.  Discharge Diagnoses:  Active Problems:   Pelvic fracture (HCC)   Aspiration pneumonia [facility])    Discharge Instructions  Discharge Instructions    Diet - low sodium heart healthy   Complete by: As directed    Discharge instructions   Complete by: As directed    1)Please take prescribed medications as instructed. 2)Please take small bites at a time. Sit upright while eating and stay upright for an hour after finishing the food.   Increase activity slowly   Complete by: As directed      Allergies as of 12/24/2019      Reactions   Prednisone Other (See Comments)   Makes the patient depressed      Medication List    TAKE these medications   acetaminophen 500 MG tablet Commonly known as: TYLENOL Take 500 mg by mouth every 6 (six) hours as needed for mild pain or moderate pain.   amoxicillin 500 MG tablet Commonly known as: AMOXIL Take 1 tablet (500 mg total) by mouth 2 (two) times daily.   aspirin EC 81 MG tablet Take 81 mg by mouth every evening.   carvedilol 3.125 MG tablet Commonly known as: COREG TAKE 1 TABLET BY MOUTH TWICE DAILY.   CITRACAL +D3 PO Take 1 tablet by mouth at bedtime.   cyanocobalamin 1000 MCG tablet Take 1,000 mcg by mouth daily.   doxycycline 100 MG capsule Commonly known as: MONODOX Take 100 mg by mouth 2 (two) times daily.   DULoxetine 60 MG capsule Commonly known as: Cymbalta Take 1 capsule (60 mg total)  by mouth daily.   enzalutamide 40 MG capsule Commonly known as: XTANDI Take 160 mg by mouth daily with supper.   FeroSul 325 (65 FE) MG  tablet Generic drug: ferrous sulfate Take 325 mg by mouth daily with breakfast.   folic acid 1 MG tablet Commonly known as: FOLVITE Take 1 mg by mouth See admin instructions. Take 1 mg by mouth once a day on Sun/Mon/Wed/Thurs/Fri/Sat (not on Tuesdays)   ibuprofen 200 MG tablet Commonly known as: ADVIL Take 200-400 mg by mouth every 6 (six) hours as needed (for pain or inflammation).   isosorbide mononitrate 30 MG 24 hr tablet Commonly known as: IMDUR Take 1 tablet (30 mg total) by mouth daily.   lamoTRIgine 25 MG tablet Commonly known as: LAMICTAL One tablet twice a day for 2 weeks, then take 2 tablets twice a day for 2 weeks, then take 3 tablets twice a day What changed:   how much to take  how to take this  when to take this  additional instructions   MAGNESIUM PO Take 1 tablet by mouth daily.   methotrexate 2.5 MG tablet Commonly known as: RHEUMATREX Take 10 mg by mouth every Tuesday.   MULTIPLE VITAMIN PO Take 1 tablet by mouth daily.   nitroGLYCERIN 0.4 MG SL tablet Commonly known as: NITROSTAT 1 TAB UNDER TONGUE AS NEEDED FOR CHEST PAIN. MAY REPEAT EVERY 5 MIN FOR A TOTAL OF 3 DOSES. What changed: See the new instructions.   polyethylene glycol 17 g packet Commonly known as: MIRALAX / GLYCOLAX Take 17 g by mouth daily as needed for mild constipation.   rosuvastatin 10 MG tablet Commonly known as: CRESTOR TAKE 1 TABLET ONCE DAILY. What changed: when to take this   traMADol 50 MG tablet Commonly known as: ULTRAM Take 1 tablet (50 mg total) by mouth every 6 (six) hours as needed (for pain). What changed:   how much to take  when to take this      Contact information for after-discharge care    Destination    [facility] .   Service: Skilled Nursing Contact information: [place] [phone]             Allergies  Allergen Reactions  . Prednisone Other (See  Comments)    Makes the patient depressed    Consultations:  Orthopedics   Procedures/Studies: EEG  Result Date: 11/25/2019 Lora Havens, MD     11/25/2019  5:42 PM Patient Name: HINCKLEY MORENA MRN: [id number] Epilepsy Attending: Lora Havens Referring Physician/Provider: Dr Kerney Elbe Date: 11/25/2019 Duration: 23.41 mins Patient history: 84 y.o. male with h/o "staring spells" presenting after acute onset of aphasia, right facial droop and right sided weakness at home. EEG to evaluate for seizure. Level of alertness: awake AEDs during EEG study: None Technical aspects: This EEG study was done with scalp electrodes positioned according to the 10-20 International system of electrode placement. Electrical activity was acquired at a sampling rate of 500Hz  and reviewed with a high frequency filter of 70Hz  and a low frequency filter of 1Hz . EEG data were recorded continuously and digitally stored. DESCRIPTION: No clear posterior dominant rhythm was seen. EEG showed continuous generalized 3-5hz  theta-delta slowing as well as intermittent rhythmic generalized 2-3Hz  delta slowing.  Hyperventilation and photic stimulation were not performed. ABNORMALITY - Continuous slow, generalized - Intermittent rhythmic slow, generalized IMPRESSION: This study is suggestive of moderate diffuse encephalopathy, non specific to etiology.  No seizures or epileptiform discharges were seen throughout the recording. Lora Havens   CT Code Stroke CTA Head W/WO contrast  Result Date: 11/25/2019 CLINICAL DATA:  Neuro deficit, acute, stroke suspected. EXAM: CT ANGIOGRAPHY HEAD AND NECK CT PERFUSION BRAIN TECHNIQUE: Multidetector CT imaging of the head and neck was performed using the standard protocol during bolus administration of intravenous contrast. Multiplanar CT image reconstructions and MIPs were obtained to evaluate the vascular anatomy. Carotid stenosis measurements (when applicable) are obtained utilizing NASCET  criteria, using the distal internal carotid diameter as the denominator. Multiphase CT imaging of the brain was performed following IV bolus contrast injection. Subsequent parametric perfusion maps were calculated using RAPID software. CONTRAST:  157mL OMNIPAQUE IOHEXOL 350 MG/ML SOLN COMPARISON:  Noncontrast head CT performed earlier the same day, CT angiogram head/neck 11/14/2019 FINDINGS: CTA NECK FINDINGS Aortic arch: Standard aortic branching. Scattered soft and calcified plaque within the visualized aortic arch and proximal major branch vessels of the neck. Right carotid system: CCA widely patent to the bifurcation. Mild atherosclerotic plaque within the carotid bifurcation and proximal ICA. No measurable stenosis of the proximal ICA relative to the more distal vessel. Left carotid system: CCA patent to the bifurcation. Soft and calcified plaque within the carotid bifurcation and proximal ICA. No measurable stenosis of the proximal ICA as compared to the more distal vessel. Vertebral arteries: The left vertebral artery is significantly dominant. The non dominant right vertebral artery is patent throughout the neck without significant stenosis (50% or greater). Mixed plaque results in unchanged moderate/severe ostial stenosis at the origin of the dominant left vertebral artery. Distal to this, the left vertebral artery is patent within the neck without significant stenosis (50% or greater). Skeleton: No acute bony abnormality. Cervical spondylosis with multilevel shallow posterior disc osteophytes, uncovertebral and facet hypertrophy. No high-grade bony spinal canal stenosis. Other neck: No neck mass or cervical lymphadenopathy. Subcentimeter right thyroid lobe nodule, not meeting consensus criteria for ultrasound follow-up. Upper chest: Ill-defined opacity within the posterior aspect of the partially visualized right upper lobe suspicious for pneumonia. Review of the MIP images confirms the above findings CTA  HEAD FINDINGS Anterior circulation: The intracranial internal carotid arteries are patent bilaterally with scattered calcified plaque but no more than mild stenosis. The right middle and anterior cerebral arteries are patent without proximal branch occlusion or high-grade proximal stenosis. The left middle cerebral artery is patent without proximal branch occlusion or high-grade proximal arterial stenosis identified. The left anterior cerebral artery is patent without significant proximal stenosis. Redemonstrated multifocal high-grade stenoses within distal left anterior cerebral artery branches (for instance as seen on series 6, image 58) (series 11, image 23). No intracranial aneurysm is identified. Posterior circulation: The non dominant intracranial right vertebral artery is patent and appears to terminate as the right PICA. The dominant intracranial left vertebral artery is patent without significant stenosis, as is the basilar artery. The bilateral posterior cerebral arteries are patent without significant proximal stenosis. Posterior communicating arteries are poorly delineated and may be hypoplastic or absent bilaterally. Venous sinuses: Within limitations of contrast timing, no convincing thrombus. Anatomic variants: As described Review of the MIP images confirms the above findings CT Brain Perfusion Findings: CBF (<30%) Volume: None Perfusion (Tmax>6.0s) volume: None Mismatch Volume: None Infarction Location: None These results were communicated to Dr. Cheral Marker At 3:33 pmon 1/12/2021by text page via the [facility] messaging system. IMPRESSION: CTA neck: 1. The bilateral common and internal carotid arteries are patent within the neck without significant stenosis. Mild  plaque at the carotid bifurcations and within the proximal ICAs. 2. Significantly dominant left vertebral artery with redemonstrated moderate/severe ostial stenosis. The vertebral arteries are otherwise patent within the neck without significant  stenosis. 3. Ill-defined airspace opacity within the posterior aspect of the partially imaged right upper lobe suspicious for pneumonia. CTA head: 1. No intracranial large vessel occlusion or proximal high-grade arterial stenosis identified. 2. Redemonstrated multifocal high-grade stenoses within distal left anterior cerebral artery branches. 3. Calcified atherosclerosis within the intracranial ICAs, but no more than mild stenosis. CT perfusion head: 1. The perfusion software detects no core infarct. 2. The perfusion software detects no region of critically hypoperfused parenchyma utilizing a Tmax>6 seconds threshold. Electronically Signed   By: Kellie Simmering DO   On: 11/25/2019 15:31   CT Head Wo Contrast  Result Date: 12/19/2019 CLINICAL DATA:  Neuro deficits, subacute. EXAM: CT HEAD WITHOUT CONTRAST TECHNIQUE: Contiguous axial images were obtained from the base of the skull through the vertex without intravenous contrast. COMPARISON:  November 25, 2019 FINDINGS: Brain: There is moderate severity cerebral atrophy with widening of the extra-axial spaces and ventricular dilatation. There are areas of decreased attenuation within the white matter tracts of the supratentorial brain, consistent with microvascular disease changes. Vascular: No hyperdense vessel or unexpected calcification. Skull: Normal. Negative for fracture or focal lesion. Sinuses/Orbits: No acute finding. Other: None. IMPRESSION: 1. No acute intracranial pathology. 2. Brain atrophy and microvascular disease changes within the white matter tracts of the supratentorial brain. Electronically Signed   By: Virgina Norfolk M.D.   On: 12/19/2019 18:24   CT Code Stroke CTA Neck W/WO contrast  Result Date: 11/25/2019 CLINICAL DATA:  Neuro deficit, acute, stroke suspected. EXAM: CT ANGIOGRAPHY HEAD AND NECK CT PERFUSION BRAIN TECHNIQUE: Multidetector CT imaging of the head and neck was performed using the standard protocol during bolus administration  of intravenous contrast. Multiplanar CT image reconstructions and MIPs were obtained to evaluate the vascular anatomy. Carotid stenosis measurements (when applicable) are obtained utilizing NASCET criteria, using the distal internal carotid diameter as the denominator. Multiphase CT imaging of the brain was performed following IV bolus contrast injection. Subsequent parametric perfusion maps were calculated using RAPID software. CONTRAST:  152mL OMNIPAQUE IOHEXOL 350 MG/ML SOLN COMPARISON:  Noncontrast head CT performed earlier the same day, CT angiogram head/neck 11/14/2019 FINDINGS: CTA NECK FINDINGS Aortic arch: Standard aortic branching. Scattered soft and calcified plaque within the visualized aortic arch and proximal major branch vessels of the neck. Right carotid system: CCA widely patent to the bifurcation. Mild atherosclerotic plaque within the carotid bifurcation and proximal ICA. No measurable stenosis of the proximal ICA relative to the more distal vessel. Left carotid system: CCA patent to the bifurcation. Soft and calcified plaque within the carotid bifurcation and proximal ICA. No measurable stenosis of the proximal ICA as compared to the more distal vessel. Vertebral arteries: The left vertebral artery is significantly dominant. The non dominant right vertebral artery is patent throughout the neck without significant stenosis (50% or greater). Mixed plaque results in unchanged moderate/severe ostial stenosis at the origin of the dominant left vertebral artery. Distal to this, the left vertebral artery is patent within the neck without significant stenosis (50% or greater). Skeleton: No acute bony abnormality. Cervical spondylosis with multilevel shallow posterior disc osteophytes, uncovertebral and facet hypertrophy. No high-grade bony spinal canal stenosis. Other neck: No neck mass or cervical lymphadenopathy. Subcentimeter right thyroid lobe nodule, not meeting consensus criteria for ultrasound  follow-up. Upper chest: Ill-defined opacity within  the posterior aspect of the partially visualized right upper lobe suspicious for pneumonia. Review of the MIP images confirms the above findings CTA HEAD FINDINGS Anterior circulation: The intracranial internal carotid arteries are patent bilaterally with scattered calcified plaque but no more than mild stenosis. The right middle and anterior cerebral arteries are patent without proximal branch occlusion or high-grade proximal stenosis. The left middle cerebral artery is patent without proximal branch occlusion or high-grade proximal arterial stenosis identified. The left anterior cerebral artery is patent without significant proximal stenosis. Redemonstrated multifocal high-grade stenoses within distal left anterior cerebral artery branches (for instance as seen on series 6, image 58) (series 11, image 23). No intracranial aneurysm is identified. Posterior circulation: The non dominant intracranial right vertebral artery is patent and appears to terminate as the right PICA. The dominant intracranial left vertebral artery is patent without significant stenosis, as is the basilar artery. The bilateral posterior cerebral arteries are patent without significant proximal stenosis. Posterior communicating arteries are poorly delineated and may be hypoplastic or absent bilaterally. Venous sinuses: Within limitations of contrast timing, no convincing thrombus. Anatomic variants: As described Review of the MIP images confirms the above findings CT Brain Perfusion Findings: CBF (<30%) Volume: None Perfusion (Tmax>6.0s) volume: None Mismatch Volume: None Infarction Location: None These results were communicated to Dr. Cheral Marker At 3:33 pmon 1/12/2021by text page via the [facility] messaging system. IMPRESSION: CTA neck: 1. The bilateral common and internal carotid arteries are patent within the neck without significant stenosis. Mild plaque at the carotid bifurcations and within the  proximal ICAs. 2. Significantly dominant left vertebral artery with redemonstrated moderate/severe ostial stenosis. The vertebral arteries are otherwise patent within the neck without significant stenosis. 3. Ill-defined airspace opacity within the posterior aspect of the partially imaged right upper lobe suspicious for pneumonia. CTA head: 1. No intracranial large vessel occlusion or proximal high-grade arterial stenosis identified. 2. Redemonstrated multifocal high-grade stenoses within distal left anterior cerebral artery branches. 3. Calcified atherosclerosis within the intracranial ICAs, but no more than mild stenosis. CT perfusion head: 1. The perfusion software detects no core infarct. 2. The perfusion software detects no region of critically hypoperfused parenchyma utilizing a Tmax>6 seconds threshold. Electronically Signed   By: Kellie Simmering DO   On: 11/25/2019 15:31   CT Chest Wo Contrast  Result Date: 12/19/2019 CLINICAL DATA:  Evaluate for pneumonia. EXAM: CT CHEST WITHOUT CONTRAST TECHNIQUE: Multidetector CT imaging of the chest was performed following the standard protocol without IV contrast. COMPARISON:  None. FINDINGS: Cardiovascular: There is mild to moderate severity calcification of the aortic arch. An artificial aortic valve is seen. Normal heart size. No pericardial effusion. Marked severity coronary artery calcification is seen. Mediastinum/Nodes: A cluster of subcentimeter calcified lymph nodes is seen along the left hilum. Lungs/Pleura: Mild to moderate severity patchy infiltrate is seen within the posterolateral aspect of the right upper lobe and posterior aspect of the right lower lobe. Mild areas of bibasilar linear atelectasis are seen. There is no evidence of a pleural effusion or pneumothorax. Upper Abdomen: Noninflamed diverticula are seen along the splenic flexure. Musculoskeletal: Multilevel degenerative changes seen throughout the thoracic spine. IMPRESSION: Mild-to-moderate  severity patchy right upper lobe and right lower lobe infiltrates. Aortic Atherosclerosis (ICD10-I70.0). Electronically Signed   By: Virgina Norfolk M.D.   On: 12/19/2019 18:56   CT Lumbar Spine Wo Contrast  Result Date: 12/19/2019 CLINICAL DATA:  Low back pain. EXAM: CT LUMBAR SPINE WITHOUT CONTRAST TECHNIQUE: Multidetector CT imaging of the lumbar spine was  performed without intravenous contrast administration. Multiplanar CT image reconstructions were also generated. COMPARISON:  None. FINDINGS: Segmentation: 5 lumbar type vertebrae. Alignment: There is approximately 4 mm retrolisthesis of the L5 vertebral body on S1. Vertebrae: No acute fracture. A 6 mm sclerotic focus is seen within the lateral aspect of the L4 vertebral body on the left. Paraspinal and other soft tissues: There is marked severity calcification of the abdominal aorta. Disc levels: L1-2: Mild to moderate endplates sclerosis. Mild intervertebral disc space narrowing. Bilateral facet joint hypertrophy. Normal central canal and bilateral lateral recesses. Narrowing of the bilateral intervertebral neural foramina. L2-3: Mild-to-moderate endplates sclerosis. Mild intervertebral disc space narrowing. Bilateral facet joint hypertrophy. Normal central canal and bilateral lateral recesses. Narrowing of the bilateral intervertebral neural foramina. L3-4: Mild to moderate endplates sclerosis. Mild intervertebral disc space narrowing. Bilateral facet joint hypertrophy. Normal central canal and bilateral lateral recesses. Narrowing of the bilateral intervertebral neural foramina. L4-5: Mild-to-moderate endplates sclerosis. Mild intervertebral disc space narrowing. Bilateral facet joint hypertrophy. Normal central canal and bilateral lateral recesses. Narrowing of the bilateral intervertebral neural foramina. L5-S1: Moderate-severity endplates sclerosis. Marked severity intervertebral disc space narrowing. Bilateral facet joint hypertrophy. Normal  central canal and bilateral lateral recesses. Narrowing of the bilateral intervertebral neural foramina. IMPRESSION: 1. No acute osseous abnormality. 2. Multilevel degenerative changes, most prominent at the level of L5-S1. 3. Approximately 4 mm retrolisthesis of the L5 vertebral body on S1. Electronically Signed   By: Virgina Norfolk M.D.   On: 12/19/2019 18:40   CT PELVIS WO CONTRAST  Result Date: 12/19/2019 CLINICAL DATA:  84 year old male with pelvic trauma. EXAM: CT PELVIS WITHOUT CONTRAST TECHNIQUE: Multidetector CT imaging of the pelvis was performed following the standard protocol without intravenous contrast. COMPARISON:  CT of the abdomen pelvis dated 01/25/2016. FINDINGS: Evaluation of this exam is limited in the absence of intravenous contrast. Evaluation is also limited due to respiratory motion artifact. Urinary Tract: The visualized ureters and urinary bladder appear unremarkable. Bowel:  Unremarkable visualized pelvic bowel loops. Vascular/Lymphatic: Advanced aortoiliac atherosclerotic disease. Reproductive:  Prostate fiducial markers noted. Other:  None Musculoskeletal: There is a nondisplaced fracture of the left inferior pubic ramus. Linear cortical lucency in the left ischial tuberosity (axial series 3, image 108 and coronal series 6 images 68 and 72) may represent vascular groove and less likely a nondisplaced fracture. No other acute fracture identified. The bones are osteopenic. There is no dislocation. There is a patchy area of sclerotic changes in the superior left acetabulum as well as a subcentimeter sclerotic focus in the posterior right iliac bone most concerning for metastatic disease, likely from prostate cancer. Clinical correlation is recommended. Further evaluation with MRI or bone scan recommended. IMPRESSION: 1. Nondisplaced fracture of the left inferior pubic ramus. 2. Sclerotic lesion in the superior aspect of the left acetabulum and right iliac bone new since the prior CT  and most concerning for metastatic disease. Further evaluation with MRI or bone scan recommended. Electronically Signed   By: Anner Crete M.D.   On: 12/19/2019 18:55   CT Code Stroke Cerebral Perfusion with contrast  Result Date: 11/25/2019 CLINICAL DATA:  Neuro deficit, acute, stroke suspected. EXAM: CT ANGIOGRAPHY HEAD AND NECK CT PERFUSION BRAIN TECHNIQUE: Multidetector CT imaging of the head and neck was performed using the standard protocol during bolus administration of intravenous contrast. Multiplanar CT image reconstructions and MIPs were obtained to evaluate the vascular anatomy. Carotid stenosis measurements (when applicable) are obtained utilizing NASCET criteria, using the distal internal carotid  diameter as the denominator. Multiphase CT imaging of the brain was performed following IV bolus contrast injection. Subsequent parametric perfusion maps were calculated using RAPID software. CONTRAST:  170mL OMNIPAQUE IOHEXOL 350 MG/ML SOLN COMPARISON:  Noncontrast head CT performed earlier the same day, CT angiogram head/neck 11/14/2019 FINDINGS: CTA NECK FINDINGS Aortic arch: Standard aortic branching. Scattered soft and calcified plaque within the visualized aortic arch and proximal major branch vessels of the neck. Right carotid system: CCA widely patent to the bifurcation. Mild atherosclerotic plaque within the carotid bifurcation and proximal ICA. No measurable stenosis of the proximal ICA relative to the more distal vessel. Left carotid system: CCA patent to the bifurcation. Soft and calcified plaque within the carotid bifurcation and proximal ICA. No measurable stenosis of the proximal ICA as compared to the more distal vessel. Vertebral arteries: The left vertebral artery is significantly dominant. The non dominant right vertebral artery is patent throughout the neck without significant stenosis (50% or greater). Mixed plaque results in unchanged moderate/severe ostial stenosis at the origin  of the dominant left vertebral artery. Distal to this, the left vertebral artery is patent within the neck without significant stenosis (50% or greater). Skeleton: No acute bony abnormality. Cervical spondylosis with multilevel shallow posterior disc osteophytes, uncovertebral and facet hypertrophy. No high-grade bony spinal canal stenosis. Other neck: No neck mass or cervical lymphadenopathy. Subcentimeter right thyroid lobe nodule, not meeting consensus criteria for ultrasound follow-up. Upper chest: Ill-defined opacity within the posterior aspect of the partially visualized right upper lobe suspicious for pneumonia. Review of the MIP images confirms the above findings CTA HEAD FINDINGS Anterior circulation: The intracranial internal carotid arteries are patent bilaterally with scattered calcified plaque but no more than mild stenosis. The right middle and anterior cerebral arteries are patent without proximal branch occlusion or high-grade proximal stenosis. The left middle cerebral artery is patent without proximal branch occlusion or high-grade proximal arterial stenosis identified. The left anterior cerebral artery is patent without significant proximal stenosis. Redemonstrated multifocal high-grade stenoses within distal left anterior cerebral artery branches (for instance as seen on series 6, image 58) (series 11, image 23). No intracranial aneurysm is identified. Posterior circulation: The non dominant intracranial right vertebral artery is patent and appears to terminate as the right PICA. The dominant intracranial left vertebral artery is patent without significant stenosis, as is the basilar artery. The bilateral posterior cerebral arteries are patent without significant proximal stenosis. Posterior communicating arteries are poorly delineated and may be hypoplastic or absent bilaterally. Venous sinuses: Within limitations of contrast timing, no convincing thrombus. Anatomic variants: As described Review  of the MIP images confirms the above findings CT Brain Perfusion Findings: CBF (<30%) Volume: None Perfusion (Tmax>6.0s) volume: None Mismatch Volume: None Infarction Location: None These results were communicated to Dr. Cheral Marker At 3:33 pmon 1/12/2021by text page via the [facility] messaging system. IMPRESSION: CTA neck: 1. The bilateral common and internal carotid arteries are patent within the neck without significant stenosis. Mild plaque at the carotid bifurcations and within the proximal ICAs. 2. Significantly dominant left vertebral artery with redemonstrated moderate/severe ostial stenosis. The vertebral arteries are otherwise patent within the neck without significant stenosis. 3. Ill-defined airspace opacity within the posterior aspect of the partially imaged right upper lobe suspicious for pneumonia. CTA head: 1. No intracranial large vessel occlusion or proximal high-grade arterial stenosis identified. 2. Redemonstrated multifocal high-grade stenoses within distal left anterior cerebral artery branches. 3. Calcified atherosclerosis within the intracranial ICAs, but no more than mild stenosis. CT perfusion  head: 1. The perfusion software detects no core infarct. 2. The perfusion software detects no region of critically hypoperfused parenchyma utilizing a Tmax>6 seconds threshold. Electronically Signed   By: Kellie Simmering DO   On: 11/25/2019 15:31   DG Chest Portable 1 View  Result Date: 12/19/2019 CLINICAL DATA:  84 year old male with weakness. EXAM: PORTABLE CHEST 1 VIEW COMPARISON:  Chest radiograph dated 11/14/2019. FINDINGS: Right upper lobe and lingular densities which although may represent atelectasis are concerning for developing infiltrate. Clinical correlation and follow-up to resolution recommended. There is no pleural effusion or pneumothorax. The cardiac silhouette is within normal limits. Coronary vascular calcification and aortic valve repair noted. Atherosclerotic calcification of the aorta.  No acute osseous pathology. IMPRESSION: Findings concerning for right upper lobe and lingular pneumonia. Clinical correlation and follow-up recommended. Electronically Signed   By: Anner Crete M.D.   On: 12/19/2019 17:16   DG Swallowing Func-Speech Pathology  Result Date: 12/22/2019 Objective Swallowing Evaluation: Type of Study: MBS-Modified Barium Swallow Study  Completed and documented by Lenore Manner, SLP Student Supervised by Herbie Baltimore, MA CCC-SLP Acute Rehabilitation Services Pager [phone] Office [phone] Patient Details Name: FABRICE HORNBACK MRN: [id number] Date of Birth: 1936/07/14 Today's Date: 12/22/2019 Time: SLP Start Time (ACUTE ONLY): 0900 -SLP Stop Time (ACUTE ONLY): 0923 SLP Time Calculation (min) (ACUTE ONLY): 23 min Past Medical History: Past Medical History: Diagnosis Date . Abdominal aortic atherosclerosis ([place])  . Anginal pain ([place])   last noted 08/17/19 . Bone cancer ([place])   Left hip . BPPV (benign paroxysmal positional vertigo)  . Bullous pemphigoid  . CAD (coronary artery disease)   a.  LHC 8/16: Mid to distal LAD 30%, OM1 40%, proximal mid RCA 40%, distal RCA 60% >> FFR 0.69  >> PCI: 3 x 15 mm Resolute DES . Depression  . Esophageal reflux  . Grade I diastolic dysfunction [id number]  Noted on ECHO  . H/O blood clots   "get them in my stool and urine" (11/12/2015) . Heart murmur  . Hepatic lesion 12/08/2015  Stable 8 mm right hepatic lesion . History of aortic stenosis   a. peak to peak gradient by LHC 8/16:  37 mmHg (moderately severe)  . History of appendicitis 2016 . History of blood transfusion 12/2018 . History of herpes labialis  . History of ITP   2018, thrombocytopenia . History of kidney stones  . Hx of radiation therapy 04/14/13-06/09/13  prostate 7800 cGy, 40 sessions, seminal vesicles 5600 cGy 40 sessions . Hydrocele 2006  Small . Hyperlipidemia  . Insomnia   resolved . LVH (left ventricular hypertrophy)   Moderate, noted on ECHO 09/2018 . Malignant melanoma in situ  ([place]) 09/04/2016  Right neck and chest . Metastasis from malignant neoplasm of prostate ([place]) 02/25/2019 . Prostate cancer ([place]) dx'd 2014 . Radiation proctitis  . Renal mass 2017  Bilateral renal masses . S/P skin biopsy 04/09/2019  subepidermal cell poor vesicle , Linear IGG and C3 the basement membrane . Suprapubic catheter ([place])  . Wears hearing aid in both ears  Past Surgical History: Past Surgical History: Procedure Laterality Date . CARDIAC CATHETERIZATION N/A 04/21/2015  Procedure: Right/Left Heart Cath and Coronary Angiography;  Surgeon: [name], MD;  Location: [place] [facility];  Service: Cardiovascular;  Laterality: N/A; . CARDIAC CATHETERIZATION N/A 06/18/2015  Procedure: Intravascular Pressure Wire/FFR Study;  Surgeon: [name], MD;  Location: [place] [facility];  Service: Cardiovascular;  Laterality: N/A; . CARDIAC CATHETERIZATION N/A 06/18/2015  Procedure: Coronary Stent  Intervention;  Surgeon: [name], MD;  Location: [place] [facility];  Service: Cardiovascular;  Laterality: N/A; . CARDIAC CATHETERIZATION N/A 06/18/2015  Procedure: Right/Left Heart Cath and Coronary Angiography;  Surgeon: [name], MD;  Location: [place] [facility];  Service: Cardiovascular;  Laterality: N/A; . CARDIAC CATHETERIZATION N/A 06/23/2015  Procedure: Left Heart Cath and Cors/Grafts Angiography;  Surgeon: [name], MD;  Location: [place] [facility];  Service: Cardiovascular;  Laterality: N/A; . CARDIAC CATHETERIZATION N/A 01/17/2016  Procedure: Right/Left Heart Cath and Coronary Angiography;  Surgeon: [name], MD;  Location: [place] [facility];  Service: Cardiovascular;  Laterality: N/A; . CATARACT EXTRACTION, BILATERAL   . COLONOSCOPY  06/26/2017 . CYSTOSCOPY WITH BIOPSY N/A 07/30/2018  Procedure: CYSTOSCOPY WITH BIOPSY/FULGURATION, CYSTOLTHOLAPAXY;  Surgeon: [name], MD;  Location[facility];  Service: Urology;  Laterality: N/A; . CYSTOSCOPY WITH URETHRAL DILATATION N/A  08/18/2019  Procedure: CYSTOSCOPY WITH URETHRAL DILATATION USING BALLOON OR LASER/ SPURO PUBIC TUBE CHANGE LASER EXCISION OF URETHRAL STRICTURE RETROGRADE URETHROGRAM ANTEGRADE CYSTOGRAM;  Surgeon: [name], MD;  Location: [facility];  Service: Urology;  Laterality: N/A; . FRACTURE SURGERY   . INGUINAL HERNIA REPAIR    patient does not remember this procedure . LAPAROSCOPIC APPENDECTOMY N/A 11/16/2015  Procedure: APPENDECTOMY LAPAROSCOPIC;  Surgeon: [name], MD;  Location: [place];  Service: General;  Laterality: N/A; . LEFT HEART CATH AND CORONARY ANGIOGRAPHY N/A 12/26/2016  Procedure: Left Heart Cath and Coronary Angiography;  Surgeon: Troy Sine, MD;  Location: [place] [facility];  Service: Cardiovascular;  Laterality: N/A; . MELANOMA EXCISION Right 10/24/2016  Procedure: WIDE EXCISION MELANOMA RIGHT NECK AND RIGHT CHEST TIMES 2;  Surgeon: [name], MD;  Location: [place];  Service: General;  Laterality: Right;  right medial and lateral lesion of chest and right neck . NASAL FRACTURE SURGERY    "broken years ago; several ORs to correct it" . NASAL SEPTUM SURGERY   . PROSTATE BIOPSY  2014  "needle biopsy" . TEE WITHOUT CARDIOVERSION N/A 02/15/2016  Procedure: TRANSESOPHAGEAL ECHOCARDIOGRAM (TEE);  Surgeon: [name], MD;  Location: [place];  Service: Open Heart Surgery;  Laterality: N/A; . TEE WITHOUT CARDIOVERSION N/A 08/26/2019  Procedure: TRANSESOPHAGEAL ECHOCARDIOGRAM (TEE);  Surgeon: Pixie Casino, MD;  Location: [facility];  Service: Cardiovascular;  Laterality: N/A; . TONSILLECTOMY   . TRANSCATHETER AORTIC VALVE REPLACEMENT, TRANSFEMORAL  02/15/2016 . TRANSCATHETER AORTIC VALVE REPLACEMENT, TRANSFEMORAL N/A 02/15/2016  Procedure: TRANSCATHETER AORTIC VALVE REPLACEMENT, TRANSFEMORAL;  Surgeon: [name], MD;  Location: [place];  Service: Open Heart Surgery;  Laterality: N/A; . TRANSURETHRAL RESECTION OF PROSTATE  12/23/2018  Procedure: CYSTOSCOPY WITH  BLADDER BIOPSY  WITH FULGERATION/ TRANSURETHRAL RESECTION PROSTATE  AND PROSTATE BIOPSY;  Surgeon: [name], MD;  [facility];  Service: Urology;; HPI: Corey Oneill is a 84 y.o. 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  This was described as a mechanical fall, with him slipping on slippery floor at home, and injuring his tailbone.  He typically uses a walker to walk, had increasing difficulty and has been using a wheelchair for the last day.  He was evaluated by his PCP on 2/3, and found to be doing fairly well, though unfortunately in the intervening time he has  had decreased p.o. intake and progressive weakness, culminating with an episode of frank hematuria today, prompting his daughter call EMS, to take him to the emergency department.  Reports associated back, leg and tailbone pain.  Subjective: alert, upright in chair Assessment / Plan / Recommendation CHL IP CLINICAL IMPRESSIONS 12/22/2019 Clinical Impression Paitient presents with moderate oropharyngeal dysphagia across all consistencies. Oral phase was remarkable for decreased bolus cohesion and reduced lingual control resulting in premature spillage to the vallecula. Pharyngeal phase was remarkable for reduced BOT retraction and epiglottic inversion, reduced hyolaryngeal excursion resulting in vallecular and pyriform sinus residue, and reduced laryngeal closure resulting in silent aspiration. Trace aspiration occured with thin liquids during the swallow (without compensatory strategies), and pt was unable to clear aspirate from airway despite Max cues from clinician d/t weak cough and reduced laryngeal strength. SLP cued pt to use effortful swallow, but  conducted the supraglottic swallow instead. This ultimately prevented the pt from aspirating by increasing the pt's overall laryngeal closure. Significant residue was observed in vallecula following puree and solid conistencies, but reduced if pt utilized a liquid wash with strategies. Recommened regular diet with thin liquids, using the super supra glottic swallow with thin liquids in order to maintain the least restrictive diet. Pt should have full supervision via staff while eating and drinking. Pt should also have strict oral care, 3x/day in order to decrease oropharyngeal bacteria. SLP will further educate the pt's wife on results from [facility], how to guide/supervise the pt while using the compensatory strategy, and the importance of strict oral care. SLP Visit Diagnosis Dysphagia, oropharyngeal phase (R13.12) Attention and concentration deficit following -- Frontal lobe and executive function deficit following -- Impact on safety and function Moderate aspiration risk   CHL IP TREATMENT RECOMMENDATION 12/22/2019 Treatment Recommendations Therapy as outlined in treatment plan below   Prognosis 12/22/2019 Prognosis for Safe Diet Advancement Good Barriers to Reach Goals -- Barriers/Prognosis Comment -- CHL IP DIET RECOMMENDATION 12/22/2019 SLP Diet Recommendations Regular solids;Thin liquid Liquid Administration via Straw Medication Administration Crushed with puree Compensations Minimize environmental distractions;Slow rate;Small sips/bites;Follow solids with liquid;Clear throat after each swallow;Effortful swallow Postural Changes Remain semi-upright after after feeds/meals (Comment);Seated upright at 90 degrees   CHL IP OTHER RECOMMENDATIONS 12/22/2019 Recommended Consults -- Oral Care Recommendations Oral care BID Other Recommendations --   CHL IP FOLLOW UP RECOMMENDATIONS 12/22/2019 Follow up Recommendations Other (comment)   CHL IP FREQUENCY AND DURATION 12/22/2019 Speech Therapy Frequency (ACUTE ONLY) min 2x/week Treatment  Duration 2 weeks      CHL IP ORAL PHASE 12/22/2019 Oral Phase Impaired Oral - Pudding Teaspoon -- Oral - Pudding Cup -- Oral - Honey Teaspoon -- Oral - Honey Cup -- Oral - Nectar Teaspoon -- Oral - Nectar Cup WFL Oral - Nectar Straw -- Oral - Thin Teaspoon -- Oral - Thin Cup Premature spillage;Decreased bolus cohesion Oral - Thin Straw Decreased bolus cohesion;Premature spillage Oral - Puree WFL Oral - Mech Soft -- Oral - Regular WFL Oral - Multi-Consistency -- Oral - Pill -- Oral Phase - Comment --  CHL IP PHARYNGEAL PHASE 12/22/2019 Pharyngeal Phase Impaired Pharyngeal- Pudding Teaspoon -- Pharyngeal -- Pharyngeal- Pudding Cup -- Pharyngeal -- Pharyngeal- Honey Teaspoon -- Pharyngeal -- Pharyngeal- Honey Cup -- Pharyngeal -- Pharyngeal- Nectar Teaspoon -- Pharyngeal -- Pharyngeal- Nectar Cup Pharyngeal residue - valleculae;Pharyngeal residue - pyriform;Reduced tongue base retraction;Reduced laryngeal elevation Pharyngeal -- Pharyngeal- Nectar Straw -- Pharyngeal -- Pharyngeal- Thin Teaspoon -- Pharyngeal -- Pharyngeal- Thin Cup Penetration/Aspiration during swallow;Reduced airway/laryngeal closure;Trace  aspiration;Reduced epiglottic inversion;Pharyngeal residue - pyriform;Pharyngeal residue - valleculae;Reduced tongue base retraction Pharyngeal Material enters airway, passes BELOW cords without attempt by patient to eject out (silent aspiration) Pharyngeal- Thin Straw Compensatory strategies attempted (with notebox);Penetration/Aspiration during swallow;Reduced airway/laryngeal closure;Reduced tongue base retraction;Pharyngeal residue - valleculae;Pharyngeal residue - pyriform Pharyngeal Material enters airway, CONTACTS cords and not ejected out Pharyngeal- Puree Pharyngeal residue - valleculae;Pharyngeal residue - pyriform;Reduced tongue base retraction;Reduced laryngeal elevation Pharyngeal -- Pharyngeal- Mechanical Soft -- Pharyngeal -- Pharyngeal- Regular Pharyngeal residue - valleculae;Pharyngeal residue -  pyriform;Reduced laryngeal elevation;Reduced tongue base retraction Pharyngeal -- Pharyngeal- Multi-consistency -- Pharyngeal -- Pharyngeal- Pill -- Pharyngeal -- Pharyngeal Comment --  CHL IP CERVICAL ESOPHAGEAL PHASE 12/22/2019 Cervical Esophageal Phase WFL Pudding Teaspoon -- Pudding Cup -- Honey Teaspoon -- Honey Cup -- Nectar Teaspoon -- Nectar Cup -- Nectar Straw -- Thin Teaspoon -- Thin Cup -- Thin Straw -- Puree -- Mechanical Soft -- Regular -- Multi-consistency -- Pill -- Cervical Esophageal Comment -- Supervised by Lynann Beaver 12/22/2019, 11:19 AM              Overnight EEG with video  Result Date: 11/27/2019 Lora Havens, MD     11/27/2019 11:23 AM Patient Name: Corey Oneill MRN: [id number] Epilepsy Attending: Lora Havens Referring Physician/Provider: Dr Kerney Elbe Duration: 11/26/2019 0939 to 11/27/2019 1043  Patient history: 84 y.o.malewith h/o "staring spells" presenting after acute onset of aphasia, right facial droop and right sided weakness at home. EEG to evaluate for seizure.  Level of alertness: awake, asleep  AEDs during EEG study: None  Technical aspects: This EEG study was done with scalp electrodes positioned according to the 10-20 International system of electrode placement. Electrical activity was acquired at a sampling rate of 500Hz  and reviewed with a high frequency filter of 70Hz  and a low frequency filter of 1Hz . EEG data were recorded continuously and digitally stored.  DESCRIPTION: No clear posterior dominant rhythm was seen. Sleep was characterized by vertex waves, sleep spindles (12-14Hz ), maximal frontocentral. EEG showed continuous generalized 3-5hz  theta-delta slowing as well as intermittent rhythmic generalized 2-3Hz  delta slowing.  Hyperventilation and photic stimulation were not performed.  ABNORMALITY - Continuous slow, generalized - Intermittent rhythmic slow, generalized  IMPRESSION: This study is suggestive of moderate diffuse encephalopathy,  non specific to etiology.  No seizures or epileptiform discharges were seen throughout the recording.  Lora Havens   CT HEAD CODE STROKE WO CONTRAST  Result Date: 11/25/2019 CLINICAL DATA:  Code stroke.  Aphasia.  Right-sided weakness. EXAM: CT HEAD WITHOUT CONTRAST TECHNIQUE: Contiguous axial images were obtained from the base of the skull through the vertex without intravenous contrast. COMPARISON:  MRI 11/14/2019 FINDINGS: Brain: Generalized atrophy. Chronic small-vessel ischemic changes throughout the brain as seen previously. No CT evidence of acute infarction, mass lesion, hemorrhage, hydrocephalus or extra-axial collection. Vascular: There is atherosclerotic calcification of the major vessels at the base of the brain. Skull: Normal Sinuses/Orbits: Clear/normal Other: None ASPECTS ([place] Stroke Program Early CT Score) - Ganglionic level infarction (caudate, lentiform nuclei, internal capsule, insula, M1-M3 cortex): 7 - Supraganglionic infarction (M4-M6 cortex): 3 Total score (0-10 with 10 being normal): 10 IMPRESSION: 1. No acute finding by CT. Atrophy and chronic small-vessel ischemic changes as seen previously. 2. ASPECTS is 10 3. These results were communicated to Dr. Cheral Marker at 2:29 pmon 1/12/2021by text page via the [facility] messaging system. Electronically Signed   By: Nelson Chimes M.D.   On: 11/25/2019 14:30  Subjective: Patient seen and examined at the bedside this morning.  Hemodynamically stable for discharge today to skilled nursing facility.  Discharge Exam: Vitals:   12/24/19 0500 12/24/19 0806  BP: 123/62 (!) 141/76  Pulse: 74 73  Resp: 18 18  Temp: (!) 97.3 F (36.3 C) 98.6 F (37 C)  SpO2: 96% 99%   Vitals:   12/23/19 2036 12/23/19 2100 12/24/19 0500 12/24/19 0806  BP: 119/66 119/66 123/62 (!) 141/76  Pulse: 69 71 74 73  Resp: 16 16 18 18   Temp: 97.7 F (36.5 C) 97.7 F (36.5 C) (!) 97.3 F (36.3 C) 98.6 F (37 C)  TempSrc: Oral Oral Oral Oral  SpO2:  99% 99% 96% 99%  Weight:      Height:        General: Pt is alert, awake, not in acute distress Cardiovascular: RRR, S1/S2 +, no rubs, no gallops Respiratory: CTA bilaterally, no wheezing, no rhonchi Abdominal: Soft, NT, ND, bowel sounds + Extremities: no edema, no cyanosis    The results of significant diagnostics from this hospitalization (including imaging, microbiology, ancillary and laboratory) are listed below for reference.     Microbiology: Recent Results (from the past 240 hour(s))  Urine culture     Status: None   Collection Time: 12/19/19  3:27 PM   Specimen: Urine, Random  Result Value Ref Range Status   Specimen Description URINE, RANDOM  Final   Special Requests NONE  Final   Culture   Final    NO GROWTH Performed at [facility], [address] [place], [place] [address]    Report Status 12/21/2019 FINAL  Final  Culture, blood (routine x 2)     Status: None   Collection Time: 12/19/19  6:16 PM   Specimen: BLOOD  Result Value Ref Range Status   Specimen Description BLOOD LEFT ANTECUBITAL  Final   Special Requests   Final    BOTTLES DRAWN AEROBIC AND ANAEROBIC Blood Culture adequate volume   Culture   Final    NO GROWTH 5 DAYS Performed at [facility], [place] [address], [place], [place] [address]    Report Status 12/24/2019 FINAL  Final  Respiratory Panel by RT PCR (Flu A&B, Covid) - Nasopharyngeal Swab     Status: None   Collection Time: 12/19/19  6:21 PM   Specimen: Nasopharyngeal Swab  Result Value Ref Range Status   SARS Coronavirus 2 by RT PCR NEGATIVE NEGATIVE Final    Comment: (NOTE) SARS-CoV-2 target nucleic acids are NOT DETECTED. The SARS-CoV-2 RNA is generally detectable in upper respiratoy specimens during the acute phase of infection. The lowest concentration of SARS-CoV-2 viral copies this assay can detect is 131 copies/mL. A negative result does not preclude SARS-Cov-2 infection and should not be used as the sole basis for  treatment or other patient management decisions. A negative result may occur with  improper specimen collection/handling, submission of specimen other than nasopharyngeal swab, presence of viral mutation(s) within the areas targeted by this assay, and inadequate number of viral copies (<131 copies/mL). A negative result must be combined with clinical observations, patient history, and epidemiological information. The expected result is Negative. Fact Sheet for Patients:  PinkCheek.be Fact Sheet for Healthcare Providers:  GravelBags.it This test is not yet ap proved or cleared by the [place] FDA and  has been authorized for detection and/or diagnosis of SARS-CoV-2 by FDA under an Emergency Use Authorization (EUA). This EUA will remain  in effect (meaning  this test can be used) for the duration of the COVID-19 declaration under Section 564(b)(1) of the Act, 21 U.S.C. section 360bbb-3(b)(1), unless the authorization is terminated or revoked sooner.    Influenza A by PCR NEGATIVE NEGATIVE Final   Influenza B by PCR NEGATIVE NEGATIVE Final    Comment: (NOTE) The Xpert Xpress SARS-CoV-2/FLU/RSV assay is intended as an aid in  the diagnosis of influenza from Nasopharyngeal swab specimens and  should not be used as a sole basis for treatment. Nasal washings and  aspirates are unacceptable for Xpert Xpress SARS-CoV-2/FLU/RSV  testing. Fact Sheet for Patients: PinkCheek.be Fact Sheet for Healthcare Providers: GravelBags.it This test is not yet approved or cleared by the [place] FDA and  has been authorized for detection and/or diagnosis of SARS-CoV-2 by  FDA under an Emergency Use Authorization (EUA). This EUA will remain  in effect (meaning this test can be used) for the duration of the  Covid-19 declaration under Section 564(b)(1) of the Act, 21  U.S.C. section  360bbb-3(b)(1), unless the authorization is  terminated or revoked. Performed at [facility], [place] [address] [place], [place] [address]   Culture, blood (routine x 2)     Status: None (Preliminary result)   Collection Time: 12/20/19 12:56 AM   Specimen: BLOOD RIGHT FOREARM  Result Value Ref Range Status   Specimen Description BLOOD RIGHT FOREARM  Final   Special Requests   Final    BOTTLES DRAWN AEROBIC AND ANAEROBIC Blood Culture adequate volume   Culture   Final    NO GROWTH 4 DAYS Performed at [facility], [place] [address], [place], [place] [address]    Report Status PENDING  Incomplete  SARS CORONAVIRUS 2 (TAT 6-24 HRS) Nasopharyngeal Nasopharyngeal Swab     Status: None   Collection Time: 12/23/19 11:47 AM   Specimen: Nasopharyngeal Swab  Result Value Ref Range Status   SARS Coronavirus 2 NEGATIVE NEGATIVE Final    Comment: (NOTE) SARS-CoV-2 target nucleic acids are NOT DETECTED. The SARS-CoV-2 RNA is generally detectable in upper and lower respiratory specimens during the acute phase of infection. Negative results do not preclude SARS-CoV-2 infection, do not rule out co-infections with other pathogens, and should not be used as the sole basis for treatment or other patient management decisions. Negative results must be combined with clinical observations, patient history, and epidemiological information. The expected result is Negative. Fact Sheet for Patients: SugarRoll.be Fact Sheet for Healthcare Providers: https://www.woods-mathews.com/ This test is not yet approved or cleared by the [place] FDA and  has been authorized for detection and/or diagnosis of SARS-CoV-2 by FDA under an Emergency Use Authorization (EUA). This EUA will remain  in effect (meaning this test can be used) for the duration of the COVID-19 declaration under Section 56 4(b)(1) of the Act, 21 U.S.C. section 360bbb-3(b)(1), unless the  authorization is terminated or revoked sooner. Performed at [facility], [place] [address], [place], [place] [address]      Labs: BNP (last 3 results) No results for input(s): BNP in the last 8760 hours. Basic Metabolic Panel: Recent Labs  Lab 12/19/19 1601 12/20/19 0047 12/21/19 0407 12/22/19 0301  NA 131* 133* 135 133*  K 4.0 3.2* 4.1 4.1  CL 95* 97* 100 101  CO2 24 23 27 25   GLUCOSE 93 110* 98 95  BUN 12 10 13  27*  CREATININE 0.75 0.69 0.66 0.79  CALCIUM 8.4* 8.1* 8.1* 8.2*   Liver Function Tests: Recent Labs  Lab 12/19/19 1601 12/21/19 0407  AST 17 15  ALT 10 9  ALKPHOS 74 63  BILITOT 0.8 0.8  PROT 6.4* 5.5*  ALBUMIN 2.8* 2.4*   No results for input(s): LIPASE, AMYLASE in the last 168 hours. No results for input(s): AMMONIA in the last 168 hours. CBC: Recent Labs  Lab 12/19/19 1601 12/20/19 0047 12/21/19 0407 12/22/19 0301  WBC 7.7 4.6 3.9* 4.0  NEUTROABS 6.1 3.2 2.3 2.2  HGB 11.3* 10.0* 9.9* 9.7*  HCT 32.1* 29.7* 29.1* 28.2*  MCV 94.1 95.8 95.7 94.9  PLT PLATELET CLUMPS NOTED ON SMEAR, COUNT APPEARS DECREASED 81* 87* 98*   Cardiac Enzymes: No results for input(s): CKTOTAL, CKMB, CKMBINDEX, TROPONINI in the last 168 hours. BNP: Invalid input(s): POCBNP CBG: No results for input(s): GLUCAP in the last 168 hours. D-Dimer No results for input(s): DDIMER in the last 72 hours. Hgb A1c No results for input(s): HGBA1C in the last 72 hours. Lipid Profile No results for input(s): CHOL, HDL, LDLCALC, TRIG, CHOLHDL, LDLDIRECT in the last 72 hours. Thyroid function studies No results for input(s): TSH, T4TOTAL, T3FREE, THYROIDAB in the last 72 hours.  Invalid input(s): FREET3 Anemia work up No results for input(s): VITAMINB12, FOLATE, FERRITIN, TIBC, IRON, RETICCTPCT in the last 72 hours. Urinalysis    Component Value Date/Time   COLORURINE STRAW (A) 12/20/2019 0136   APPEARANCEUR CLEAR 12/20/2019 0136   LABSPEC 1.005 12/20/2019 0136   PHURINE  6.0 12/20/2019 0136   GLUCOSEU NEGATIVE 12/20/2019 0136   HGBUR NEGATIVE 12/20/2019 0136   BILIRUBINUR NEGATIVE 12/20/2019 0136   BILIRUBINUR n 10/15/2015 0939   KETONESUR 20 (A) 12/20/2019 0136   PROTEINUR NEGATIVE 12/20/2019 0136   UROBILINOGEN 0.2 10/15/2015 0939   UROBILINOGEN 0.2 06/23/2015 1443   NITRITE NEGATIVE 12/20/2019 0136   LEUKOCYTESUR NEGATIVE 12/20/2019 0136   Sepsis Labs Invalid input(s): PROCALCITONIN,  WBC,  LACTICIDVEN Microbiology Recent Results (from the past 240 hour(s))  Urine culture     Status: None   Collection Time: 12/19/19  3:27 PM   Specimen: Urine, Random  Result Value Ref Range Status   Specimen Description URINE, RANDOM  Final   Special Requests NONE  Final   Culture   Final    NO GROWTH Performed at [facility], [place] [address], [place], [place] [address]    Report Status 12/21/2019 FINAL  Final  Culture, blood (routine x 2)     Status: None   Collection Time: 12/19/19  6:16 PM   Specimen: BLOOD  Result Value Ref Range Status   Specimen Description BLOOD LEFT ANTECUBITAL  Final   Special Requests   Final    BOTTLES DRAWN AEROBIC AND ANAEROBIC Blood Culture adequate volume   Culture   Final    NO GROWTH 5 DAYS Performed at [facility], [place] [address] [place], [place] [address]    Report Status 12/24/2019 FINAL  Final  Respiratory Panel by RT PCR (Flu A&B, Covid) - Nasopharyngeal Swab     Status: None   Collection Time: 12/19/19  6:21 PM   Specimen: Nasopharyngeal Swab  Result Value Ref Range Status   SARS Coronavirus 2 by RT PCR NEGATIVE NEGATIVE Final    Comment: (NOTE) SARS-CoV-2 target nucleic acids are NOT DETECTED. The SARS-CoV-2 RNA is generally detectable in upper respiratoy specimens during the acute phase of infection. The lowest concentration of SARS-CoV-2 viral copies this assay can detect is 131 copies/mL. A negative result does not preclude SARS-Cov-2 infection and should  not be used as the sole basis for  treatment or other patient management decisions. A negative result may occur with  improper specimen collection/handling, submission of specimen other than nasopharyngeal swab, presence of viral mutation(s) within the areas targeted by this assay, and inadequate number of viral copies (<131 copies/mL). A negative result must be combined with clinical observations, patient history, and epidemiological information. The expected result is Negative. Fact Sheet for Patients:  PinkCheek.be Fact Sheet for Healthcare Providers:  GravelBags.it This test is not yet ap proved or cleared by the [place] FDA and  has been authorized for detection and/or diagnosis of SARS-CoV-2 by FDA under an Emergency Use Authorization (EUA). This EUA will remain  in effect (meaning this test can be used) for the duration of the COVID-19 declaration under Section 564(b)(1) of the Act, 21 U.S.C. section 360bbb-3(b)(1), unless the authorization is terminated or revoked sooner.    Influenza A by PCR NEGATIVE NEGATIVE Final   Influenza B by PCR NEGATIVE NEGATIVE Final    Comment: (NOTE) The Xpert Xpress SARS-CoV-2/FLU/RSV assay is intended as an aid in  the diagnosis of influenza from Nasopharyngeal swab specimens and  should not be used as a sole basis for treatment. Nasal washings and  aspirates are unacceptable for Xpert Xpress SARS-CoV-2/FLU/RSV  testing. Fact Sheet for Patients: PinkCheek.be Fact Sheet for Healthcare Providers: GravelBags.it This test is not yet approved or cleared by the [place] FDA and  has been authorized for detection and/or diagnosis of SARS-CoV-2 by  FDA under an Emergency Use Authorization (EUA). This EUA will remain  in effect (meaning this test can be used) for the duration of the  Covid-19 declaration under Section 564(b)(1) of the Act, 21  U.S.C. section  360bbb-3(b)(1), unless the authorization is  terminated or revoked. Performed at [facility], [place] [address], [place], [place] [address]   Culture, blood (routine x 2)     Status: None (Preliminary result)   Collection Time: 12/20/19 12:56 AM   Specimen: BLOOD RIGHT FOREARM  Result Value Ref Range Status   Specimen Description BLOOD RIGHT FOREARM  Final   Special Requests   Final    BOTTLES DRAWN AEROBIC AND ANAEROBIC Blood Culture adequate volume   Culture   Final    NO GROWTH 4 DAYS Performed at [facility], [place] [address] [place], [place] [address]    Report Status PENDING  Incomplete  SARS CORONAVIRUS 2 (TAT 6-24 HRS) Nasopharyngeal Nasopharyngeal Swab     Status: None   Collection Time: 12/23/19 11:47 AM   Specimen: Nasopharyngeal Swab  Result Value Ref Range Status   SARS Coronavirus 2 NEGATIVE NEGATIVE Final    Comment: (NOTE) SARS-CoV-2 target nucleic acids are NOT DETECTED. The SARS-CoV-2 RNA is generally detectable in upper and lower respiratory specimens during the acute phase of infection. Negative results do not preclude SARS-CoV-2 infection, do not rule out co-infections with other pathogens, and should not be used as the sole basis for treatment or other patient management decisions. Negative results must be combined with clinical observations, patient history, and epidemiological information. The expected result is Negative. Fact Sheet for Patients: SugarRoll.be Fact Sheet for Healthcare Providers: https://www.woods-mathews.com/ This test is not yet approved or cleared by the [place] FDA and  has been authorized for detection and/or diagnosis of SARS-CoV-2 by FDA under an Emergency Use Authorization (EUA). This EUA will remain  in effect (meaning this test can be used) for the duration of the COVID-19  declaration under Section 56 4(b)(1) of the Act, 21 U.S.C. section 360bbb-3(b)(1), unless the  authorization is terminated or revoked sooner. Performed at [facility], [place] [address] [place], [place] [address]     Please note: You were cared for by a hospitalist during your hospital stay. Once you are discharged, your primary care physician will handle any further medical issues. Please note that NO REFILLS for any discharge medications will be authorized once you are discharged, as it is imperative that you return to your primary care physician (or establish a relationship with a primary care physician if you do not have one) for your post hospital discharge needs so that they can reassess your need for medications and monitor your lab values.    Time coordinating discharge: 40 minutes  SIGNED:   Shelly Coss, MD  [facility] 12/24/2019, 10:07 AM Pager [phone]  If 7PM-7AM, please contact night-coverage [email or web address] Password [email or web address]

## 2019-12-24 NOTE — Progress Notes (Signed)
Occupational Therapy Treatment Patient Details Name: Corey Oneill MRN: [id number] DOB: April 29, 1936 Today's Date: 12/24/2019    History of present illness 84 y.o. male with fall 12/16/19 with left inferior pubic rami fx with aspiration PNA. PMHx: metastatic prostate cancer, chronic ITP, aortic stenosis status post TAVR, recurrent enterococcal bacteremia, CAD, GERD, HLD   OT comments  Pt making progress with functional goals. Session focused on ADL transfers using RW to toilet, UB ADLs seated, LB ADLs, and toileting. Pt pleasant and cooperative and will now d/c to a SNF for ST rehab.  Follow Up Recommendations  SNF;Supervision/Assistance - 24 hour    Equipment Recommendations  3 in 1 bedside commode;Other (comment)(TBD at SNF)    Recommendations for Other Services      Precautions / Restrictions Precautions Precautions: Fall Precaution Comments: hx of falling Restrictions Weight Bearing Restrictions: No       Mobility Bed Mobility Overal bed mobility: Needs Assistance Bed Mobility: Sit to Supine     Supine to sit: Supervision Sit to supine: Min assist   General bed mobility comments: pt in chair upon arrival, assisted back to bed at end of session, min A with LEs  Transfers Overall transfer level: Needs assistance Equipment used: Rolling walker (2 wheeled) Transfers: Sit to/from Stand Sit to Stand: Min assist         General transfer comment: cues for safe hand placement, pt leaning forward during sit - stand transition    Balance Overall balance assessment: Needs assistance Sitting-balance support: Feet supported;No upper extremity supported Sitting balance-Leahy Scale: Fair Sitting balance - Comments: sig R/post lean with static sitting. The pt can correct, but is unable to maintain without cues or UE support. Postural control: Posterior lean;Right lateral lean Standing balance support: During functional activity;Bilateral upper extremity supported Standing  balance-Leahy Scale: Poor Standing balance comment: pt with strong post lean requiring modA, BUE support, and verbal cues to come to upright. was then able to ambulate with BUE support and minG                           ADL either performed or assessed with clinical judgement   ADL Overall ADL's : Needs assistance/impaired     Grooming: Set up;Supervision/safety;Sitting   Upper Body Bathing: Set up;Supervision/ safety;Sitting   Lower Body Bathing: Moderate assistance;Sit to/from stand   Upper Body Dressing : Min guard;Supervision/safety;Sitting       Toilet Transfer: Minimal assistance;Ambulation;Comfort height toilet;Grab bars;RW;Cueing for safety   Toileting- Clothing Manipulation and Hygiene: Total assistance       Functional mobility during ADLs: Cueing for safety;Cueing for sequencing;Rolling walker;Minimal assistance       Vision Baseline Vision/History: Wears glasses Wears Glasses: At all times Patient Visual Report: No change from baseline     Perception     Praxis      Cognition Arousal/Alertness: Awake/alert Behavior During Therapy: WFL for tasks assessed/performed Overall Cognitive Status: No family/caregiver present to determine baseline cognitive functioning Area of Impairment: Safety/judgement;Problem solving                     Memory: Decreased short-term memory;Decreased recall of precautions   Safety/Judgement: Decreased awareness of safety;Decreased awareness of deficits   Problem Solving: Difficulty sequencing;Requires verbal cues;Requires tactile cues General Comments: pt incontinent of stool and urine on arrival, reports he knew he had had a BM in bed but did not know he could call anyone. RN staff was present and  stated he was told multiple times overnight that he must call RN staff for BM to protect his skin. The pt was agreeable to education.        Exercises General Exercises - Lower Extremity Long Arc Quad:  AROM;Both;10 reps;Seated Heel Raises: AROM;Both;20 reps;Seated Mini-Sqauts: 10 reps;Seated;Strengthening   Shoulder Instructions       General Comments      Pertinent Vitals/ Pain       Pain Assessment: No/denies pain Faces Pain Scale: Hurts a little bit Pain Location: L hip  Pain Descriptors / Indicators: Discomfort;Grimacing;Guarding Pain Intervention(s): Limited activity within patient's tolerance;Monitored during session;Repositioned;Premedicated before session  Home Living                                          Prior Functioning/Environment              Frequency  Min 2X/week        Progress Toward Goals  OT Goals(current goals can now be found in the care plan section)  Progress towards OT goals: Progressing toward goals  Acute Rehab OT Goals Patient Stated Goal: to get back home  Plan Discharge plan needs to be updated;Frequency remains appropriate    Co-evaluation                 AM-PAC OT "6 Clicks" Daily Activity     Outcome Measure   Help from another person eating meals?: None Help from another person taking care of personal grooming?: None Help from another person toileting, which includes using toliet, bedpan, or urinal?: Total Help from another person bathing (including washing, rinsing, drying)?: A Lot Help from another person to put on and taking off regular upper body clothing?: A Little Help from another person to put on and taking off regular lower body clothing?: Total 6 Click Score: 15    End of Session Equipment Utilized During Treatment: Gait belt;Rolling walker;Other (comment)(3 in 1)  OT Visit Diagnosis: Unsteadiness on feet (R26.81);Other abnormalities of gait and mobility (R26.89);Muscle weakness (generalized) (M62.81);Other symptoms and signs involving cognitive function;History of falling (Z91.81);Pain Pain - Right/Left: Left Pain - part of body: Hip;Leg   Activity Tolerance Patient tolerated  treatment well   Patient Left in bed;with call bell/phone within reach;with bed alarm set   Nurse Communication          Time: [id number] OT Time Calculation (min): 22 min  Charges: OT General Charges $OT Visit: 1 Visit OT Treatments $Self Care/Home Management : 8-22 mins     Britt Bottom 12/24/2019, 12:34 PM

## 2019-12-24 NOTE — Plan of Care (Signed)
  Problem: Education: Goal: Knowledge of General Education information will improve Description: Including pain rating scale, medication(s)/side effects and non-pharmacologic comfort measures Outcome: Progressing   Problem: Activity: Goal: Risk for activity intolerance will decrease Outcome: Progressing   Problem: Elimination: Goal: Will not experience complications related to bowel motility Outcome: Progressing   Problem: Safety: Goal: Ability to remain free from injury will improve Outcome: Progressing   Problem: Skin Integrity: Goal: Risk for impaired skin integrity will decrease Outcome: Progressing

## 2019-12-25 DIAGNOSIS — J690 Pneumonitis due to inhalation of food and vomit: Secondary | ICD-10-CM | POA: Diagnosis not present

## 2019-12-25 DIAGNOSIS — I2510 Atherosclerotic heart disease of native coronary artery without angina pectoris: Secondary | ICD-10-CM | POA: Diagnosis not present

## 2019-12-25 DIAGNOSIS — C61 Malignant neoplasm of prostate: Secondary | ICD-10-CM | POA: Diagnosis not present

## 2019-12-25 DIAGNOSIS — Z20828 Contact with and (suspected) exposure to other viral communicable diseases: Secondary | ICD-10-CM | POA: Diagnosis not present

## 2019-12-25 DIAGNOSIS — S32592D Other specified fracture of left pubis, subsequent encounter for fracture with routine healing: Secondary | ICD-10-CM | POA: Diagnosis not present

## 2019-12-25 LAB — CULTURE, BLOOD (ROUTINE X 2)
Culture: NO GROWTH
Special Requests: ADEQUATE

## 2019-12-26 DIAGNOSIS — D509 Iron deficiency anemia, unspecified: Secondary | ICD-10-CM | POA: Diagnosis not present

## 2019-12-26 DIAGNOSIS — F322 Major depressive disorder, single episode, severe without psychotic features: Secondary | ICD-10-CM | POA: Diagnosis not present

## 2019-12-26 DIAGNOSIS — J690 Pneumonitis due to inhalation of food and vomit: Secondary | ICD-10-CM | POA: Diagnosis not present

## 2019-12-26 DIAGNOSIS — S329XXA Fracture of unspecified parts of lumbosacral spine and pelvis, initial encounter for closed fracture: Secondary | ICD-10-CM | POA: Diagnosis not present

## 2019-12-28 DIAGNOSIS — I2510 Atherosclerotic heart disease of native coronary artery without angina pectoris: Secondary | ICD-10-CM | POA: Diagnosis not present

## 2019-12-28 DIAGNOSIS — N3020 Other chronic cystitis without hematuria: Secondary | ICD-10-CM | POA: Diagnosis not present

## 2019-12-28 DIAGNOSIS — W19XXXD Unspecified fall, subsequent encounter: Secondary | ICD-10-CM | POA: Diagnosis not present

## 2019-12-28 DIAGNOSIS — R569 Unspecified convulsions: Secondary | ICD-10-CM | POA: Diagnosis not present

## 2019-12-28 DIAGNOSIS — J690 Pneumonitis due to inhalation of food and vomit: Secondary | ICD-10-CM | POA: Diagnosis not present

## 2019-12-28 DIAGNOSIS — I10 Essential (primary) hypertension: Secondary | ICD-10-CM | POA: Diagnosis not present

## 2019-12-28 DIAGNOSIS — R1319 Other dysphagia: Secondary | ICD-10-CM | POA: Diagnosis not present

## 2019-12-28 DIAGNOSIS — S32592D Other specified fracture of left pubis, subsequent encounter for fracture with routine healing: Secondary | ICD-10-CM | POA: Diagnosis not present

## 2019-12-29 ENCOUNTER — Telehealth: Payer: Self-pay

## 2019-12-29 NOTE — Telephone Encounter (Signed)
Per Dr. Meda Coffee, called [facility] to instruct them to remove event monitor (the patient's daughter contacted Dr. Meda Coffee and was told they would not remove it without an MD order).   Left message to call back. [facility]: [phone]

## 2019-12-30 NOTE — Telephone Encounter (Signed)
Left message to call back  

## 2019-12-31 ENCOUNTER — Other Ambulatory Visit: Payer: Self-pay | Admitting: *Deleted

## 2019-12-31 DIAGNOSIS — S32592D Other specified fracture of left pubis, subsequent encounter for fracture with routine healing: Secondary | ICD-10-CM | POA: Diagnosis not present

## 2019-12-31 DIAGNOSIS — C61 Malignant neoplasm of prostate: Secondary | ICD-10-CM | POA: Diagnosis not present

## 2019-12-31 DIAGNOSIS — J690 Pneumonitis due to inhalation of food and vomit: Secondary | ICD-10-CM | POA: Diagnosis not present

## 2019-12-31 DIAGNOSIS — C7951 Secondary malignant neoplasm of bone: Secondary | ICD-10-CM | POA: Diagnosis not present

## 2019-12-31 NOTE — Telephone Encounter (Signed)
Spoke with Quillian Quince at [employer]. Instructed Daniel to remove monitor and give to the patient's daughter to mail back. He was grateful for call.

## 2019-12-31 NOTE — Patient Outreach (Signed)
Screened for potential [facility] Care Management needs as a benefit of  [employer].  Mr. Alsbrooks is currently receiving skilled therapy at [facility].  Writer attended telephonic interdisciplinary team meeting to assess for disposition needs and transition plan for resident.   Facility reports member is from home with wife. However, member may transition to ALF for short period after SNF stay. Writer mentioned, per chart review, member was active with [facility] prior.   Communication sent to [facility] about member's admission to [facility].   Will continue to follow for [facility] needs and transition plans while member resides at [facility].   Marthenia Rolling, MSN-Ed, RN,BSN [place] Acute Care Coordinator [phone] [facility]) [phone]  (Toll free office)

## 2019-12-31 NOTE — Telephone Encounter (Signed)
Left message to call back  

## 2020-01-04 DIAGNOSIS — L120 Bullous pemphigoid: Secondary | ICD-10-CM | POA: Diagnosis not present

## 2020-01-04 DIAGNOSIS — J690 Pneumonitis due to inhalation of food and vomit: Secondary | ICD-10-CM | POA: Diagnosis not present

## 2020-01-04 DIAGNOSIS — W19XXXD Unspecified fall, subsequent encounter: Secondary | ICD-10-CM | POA: Diagnosis not present

## 2020-01-04 DIAGNOSIS — I10 Essential (primary) hypertension: Secondary | ICD-10-CM | POA: Diagnosis not present

## 2020-01-04 DIAGNOSIS — F329 Major depressive disorder, single episode, unspecified: Secondary | ICD-10-CM | POA: Diagnosis not present

## 2020-01-04 DIAGNOSIS — S32592D Other specified fracture of left pubis, subsequent encounter for fracture with routine healing: Secondary | ICD-10-CM | POA: Diagnosis not present

## 2020-01-07 ENCOUNTER — Other Ambulatory Visit: Payer: Self-pay | Admitting: *Deleted

## 2020-01-07 ENCOUNTER — Telehealth: Payer: Self-pay | Admitting: Licensed Clinical Social Worker

## 2020-01-07 DIAGNOSIS — C7951 Secondary malignant neoplasm of bone: Secondary | ICD-10-CM | POA: Diagnosis not present

## 2020-01-07 DIAGNOSIS — J690 Pneumonitis due to inhalation of food and vomit: Secondary | ICD-10-CM | POA: Diagnosis not present

## 2020-01-07 DIAGNOSIS — C61 Malignant neoplasm of prostate: Secondary | ICD-10-CM | POA: Diagnosis not present

## 2020-01-07 DIAGNOSIS — S32592D Other specified fracture of left pubis, subsequent encounter for fracture with routine healing: Secondary | ICD-10-CM | POA: Diagnosis not present

## 2020-01-07 NOTE — Patient Outreach (Signed)
Screened for potential [facility] Care Management needs as a benefit of  [employer].  Corey Oneill is currently receiving skilled therapy at [facility].   Writer attended telephonic interdisciplinary team meeting to assess for disposition needs and transition plan for resident.   Facility reports Corey Oneill will transition to [facility]F on Sunday 01/11/20.   Marthenia Rolling, MSN-Ed, RN,BSN [place] Acute Care Coordinator [phone] [facility]) [phone]  (Toll free office)

## 2020-01-07 NOTE — Telephone Encounter (Signed)
Palliative Care SW left a vm for patient's wife, [name], to confirm that he is still at [facility] SNF receiving therapy.

## 2020-01-12 ENCOUNTER — Telehealth: Payer: Self-pay | Admitting: Cardiovascular Disease

## 2020-01-12 ENCOUNTER — Telehealth: Payer: Self-pay | Admitting: Family Medicine

## 2020-01-12 NOTE — Telephone Encounter (Signed)
PT WIFE called is wanting to know if there is something that can be called in for nausea  And also something for appetite stimulant states that he is loosing weight, because he want eat, she can be reached at [phone]. And pt uses [place], [place]

## 2020-01-12 NOTE — Telephone Encounter (Signed)
Reviewed monitor results with DPR. She was grateful for assistance.

## 2020-01-12 NOTE — Telephone Encounter (Signed)
If [facility] is at [facility] that needs to be handled through them.  If not then I would least need to do a virtual visit

## 2020-01-12 NOTE — Telephone Encounter (Signed)
Wife of patient returned a call from [place], Dr. Antionette Char nurse to go over the patient's heart monitor results.  Wife asks that Corey Oneill just leave a detailed message on the home answering machine.

## 2020-01-13 NOTE — Telephone Encounter (Signed)
Pt has been [facility] to [facility] . They have not changed his doctors so we are still his PCP. Pt daughter say he has lost a lot of weight and needs something nausea and to increase his appetite. Please advise [facility]

## 2020-01-13 NOTE — Telephone Encounter (Signed)
Find out where he is staying and have that Dr. take care of it

## 2020-01-13 NOTE — Telephone Encounter (Signed)
Virtual appt 

## 2020-01-13 NOTE — Telephone Encounter (Signed)
Please see message below and advise. Thanks [employer]

## 2020-01-14 ENCOUNTER — Telehealth: Payer: Self-pay

## 2020-01-14 ENCOUNTER — Telehealth: Payer: Self-pay | Admitting: Family Medicine

## 2020-01-14 NOTE — Telephone Encounter (Signed)
LVM for pt daughter to call and set up a virtual visit to discuss father need for new med for appetite and nausea. [place]

## 2020-01-14 NOTE — Telephone Encounter (Signed)
Pt called and said he is in the [facility] [address]. But was recently diagnosed with stage 4 cancer and wants to discuss the next steps

## 2020-01-14 NOTE — Telephone Encounter (Signed)
LVM for pt daughter to call back to schedule a virtual visit for other issues so when can add that to upcoming appt when she calls back [facility]

## 2020-01-14 NOTE — Telephone Encounter (Signed)
Virtual appt 

## 2020-01-14 NOTE — Telephone Encounter (Signed)
LVM for pt daughter to call and make a virtual appt.[place]

## 2020-01-15 ENCOUNTER — Other Ambulatory Visit: Payer: Self-pay | Admitting: *Deleted

## 2020-01-15 NOTE — Patient Outreach (Signed)
[facility] Post- Acute Care Coordinator follow up  Member screened for potential [facility] Care Management needs as a benefit of [place] Medicare.  Verified in Patient Corey Oneill member discharged from [facility] SNF on 01/09/20. Facility reported member transitioned to ALF.    Marthenia Rolling, MSN-Ed, RN,BSN [place] Acute Care Coordinator [phone] [facility]) [phone]  (Toll free office)

## 2020-01-16 ENCOUNTER — Other Ambulatory Visit: Payer: Self-pay

## 2020-01-16 ENCOUNTER — Ambulatory Visit (INDEPENDENT_AMBULATORY_CARE_PROVIDER_SITE_OTHER): Payer: Medicare Other | Admitting: Family Medicine

## 2020-01-16 VITALS — Wt 118.0 lb

## 2020-01-16 DIAGNOSIS — R569 Unspecified convulsions: Secondary | ICD-10-CM | POA: Diagnosis not present

## 2020-01-16 DIAGNOSIS — C61 Malignant neoplasm of prostate: Secondary | ICD-10-CM | POA: Diagnosis not present

## 2020-01-16 DIAGNOSIS — C799 Secondary malignant neoplasm of unspecified site: Secondary | ICD-10-CM

## 2020-01-16 DIAGNOSIS — S32301D Unspecified fracture of right ilium, subsequent encounter for fracture with routine healing: Secondary | ICD-10-CM

## 2020-01-16 DIAGNOSIS — R627 Adult failure to thrive: Secondary | ICD-10-CM

## 2020-01-16 NOTE — Progress Notes (Signed)
   Subjective:    Patient ID: Corey Oneill, male    DOB: [birth year], 84 y.o.   MRN: [id number]  HPI Documentation for virtual telephone encounter.  Documentation for virtual audio and video telecommunications through [place] encounter:  The patient was located at  [place] The provider was located in the office. The other persons participating in this telemedicine service was his daughter, Corey Oneill this virtual service is not related to other E/M service within previous 7 days. She relates that he does not want to eat but does not complain of nausea and is decreased appetite.  She thinks his dementia is getting worse.  He apparently has been losing weight.  He will get up but does need assistance.  He does have a history of pelvic fracture.  He has had 2 Covid shots.  Review of Systems     Objective:   Physical Exam Patient not examined       Assessment & Plan:  Failure to thrive in adult - Plan: Ambulatory referral to Hospice  Prostate cancer [facility]) - Plan: Ambulatory referral to Hospice  Closed nondisplaced fracture of right ilium with routine healing, unspecified fracture morphology, subsequent encounter - Plan: Ambulatory referral to Hospice  Seizures ([place]) - Plan: Ambulatory referral to Hospice  Metastasis from malignant neoplasm of prostate [facility]) - Plan: Ambulatory referral to Hospice I explained that he has many things going against him including the above diagnoses as well as cognitive dysfunction.  Discussed the possible use of Marinol to help with his appetite but the CNS effect of this is also something that cannot be ignored.  I then discussed the fact that as she discussed anything with him in terms of his giving up.  Apparently she has not done that yet.  We then discussed possible referral to hospice to help with his care.  I will make the referral.

## 2020-01-19 DIAGNOSIS — M6281 Muscle weakness (generalized): Secondary | ICD-10-CM | POA: Diagnosis not present

## 2020-01-19 DIAGNOSIS — S32509S Unspecified fracture of unspecified pubis, sequela: Secondary | ICD-10-CM | POA: Diagnosis not present

## 2020-01-19 DIAGNOSIS — F0150 Vascular dementia without behavioral disturbance: Secondary | ICD-10-CM | POA: Diagnosis not present

## 2020-01-19 DIAGNOSIS — R2681 Unsteadiness on feet: Secondary | ICD-10-CM | POA: Diagnosis not present

## 2020-01-19 DIAGNOSIS — Z9181 History of falling: Secondary | ICD-10-CM | POA: Diagnosis not present

## 2020-01-19 DIAGNOSIS — Z8616 Personal history of COVID-19: Secondary | ICD-10-CM | POA: Diagnosis not present

## 2020-01-19 DIAGNOSIS — R269 Unspecified abnormalities of gait and mobility: Secondary | ICD-10-CM | POA: Diagnosis not present

## 2020-01-19 DIAGNOSIS — A419 Sepsis, unspecified organism: Secondary | ICD-10-CM | POA: Diagnosis not present

## 2020-01-20 DIAGNOSIS — S32509S Unspecified fracture of unspecified pubis, sequela: Secondary | ICD-10-CM | POA: Diagnosis not present

## 2020-01-20 DIAGNOSIS — Z9181 History of falling: Secondary | ICD-10-CM | POA: Diagnosis not present

## 2020-01-20 DIAGNOSIS — R269 Unspecified abnormalities of gait and mobility: Secondary | ICD-10-CM | POA: Diagnosis not present

## 2020-01-20 DIAGNOSIS — A419 Sepsis, unspecified organism: Secondary | ICD-10-CM | POA: Diagnosis not present

## 2020-01-20 DIAGNOSIS — M6281 Muscle weakness (generalized): Secondary | ICD-10-CM | POA: Diagnosis not present

## 2020-01-20 DIAGNOSIS — R2681 Unsteadiness on feet: Secondary | ICD-10-CM | POA: Diagnosis not present

## 2020-01-21 ENCOUNTER — Telehealth: Payer: Self-pay

## 2020-01-21 DIAGNOSIS — A419 Sepsis, unspecified organism: Secondary | ICD-10-CM | POA: Diagnosis not present

## 2020-01-21 DIAGNOSIS — M6281 Muscle weakness (generalized): Secondary | ICD-10-CM | POA: Diagnosis not present

## 2020-01-21 DIAGNOSIS — Z9181 History of falling: Secondary | ICD-10-CM | POA: Diagnosis not present

## 2020-01-21 DIAGNOSIS — S32509S Unspecified fracture of unspecified pubis, sequela: Secondary | ICD-10-CM | POA: Diagnosis not present

## 2020-01-21 DIAGNOSIS — R2681 Unsteadiness on feet: Secondary | ICD-10-CM | POA: Diagnosis not present

## 2020-01-21 DIAGNOSIS — R269 Unspecified abnormalities of gait and mobility: Secondary | ICD-10-CM | POA: Diagnosis not present

## 2020-01-21 NOTE — Telephone Encounter (Signed)
Informed patient's wife she will be called in the AM as the phones are now off.

## 2020-01-21 NOTE — Telephone Encounter (Signed)
-----   Message from [facility] sent at 01/21/2020  9:20 AM EST ----- Regarding: Virtual Visit for 01/23/20 Patient is scheduled for a Mychart Video Visit with Dr. Burt Knack on 01/23/20. Please contact wife with instructions [phone]. Thank you!

## 2020-01-22 ENCOUNTER — Telehealth: Payer: Self-pay

## 2020-01-22 DIAGNOSIS — E559 Vitamin D deficiency, unspecified: Secondary | ICD-10-CM | POA: Diagnosis not present

## 2020-01-22 DIAGNOSIS — I509 Heart failure, unspecified: Secondary | ICD-10-CM | POA: Diagnosis not present

## 2020-01-22 DIAGNOSIS — I25119 Atherosclerotic heart disease of native coronary artery with unspecified angina pectoris: Secondary | ICD-10-CM | POA: Diagnosis not present

## 2020-01-22 DIAGNOSIS — R269 Unspecified abnormalities of gait and mobility: Secondary | ICD-10-CM | POA: Diagnosis not present

## 2020-01-22 DIAGNOSIS — M6281 Muscle weakness (generalized): Secondary | ICD-10-CM | POA: Diagnosis not present

## 2020-01-22 DIAGNOSIS — Z9181 History of falling: Secondary | ICD-10-CM | POA: Diagnosis not present

## 2020-01-22 DIAGNOSIS — C61 Malignant neoplasm of prostate: Secondary | ICD-10-CM | POA: Diagnosis not present

## 2020-01-22 DIAGNOSIS — G40909 Epilepsy, unspecified, not intractable, without status epilepticus: Secondary | ICD-10-CM | POA: Diagnosis not present

## 2020-01-22 DIAGNOSIS — R2681 Unsteadiness on feet: Secondary | ICD-10-CM | POA: Diagnosis not present

## 2020-01-22 DIAGNOSIS — J690 Pneumonitis due to inhalation of food and vomit: Secondary | ICD-10-CM | POA: Diagnosis not present

## 2020-01-22 DIAGNOSIS — C7951 Secondary malignant neoplasm of bone: Secondary | ICD-10-CM | POA: Diagnosis not present

## 2020-01-22 DIAGNOSIS — D693 Immune thrombocytopenic purpura: Secondary | ICD-10-CM | POA: Diagnosis not present

## 2020-01-22 DIAGNOSIS — E785 Hyperlipidemia, unspecified: Secondary | ICD-10-CM | POA: Diagnosis not present

## 2020-01-22 DIAGNOSIS — E46 Unspecified protein-calorie malnutrition: Secondary | ICD-10-CM | POA: Diagnosis not present

## 2020-01-22 DIAGNOSIS — I110 Hypertensive heart disease with heart failure: Secondary | ICD-10-CM | POA: Diagnosis not present

## 2020-01-22 DIAGNOSIS — S329XXD Fracture of unspecified parts of lumbosacral spine and pelvis, subsequent encounter for fracture with routine healing: Secondary | ICD-10-CM | POA: Diagnosis not present

## 2020-01-22 DIAGNOSIS — S32509S Unspecified fracture of unspecified pubis, sequela: Secondary | ICD-10-CM | POA: Diagnosis not present

## 2020-01-22 DIAGNOSIS — M069 Rheumatoid arthritis, unspecified: Secondary | ICD-10-CM | POA: Diagnosis not present

## 2020-01-22 DIAGNOSIS — F329 Major depressive disorder, single episode, unspecified: Secondary | ICD-10-CM | POA: Diagnosis not present

## 2020-01-22 DIAGNOSIS — A419 Sepsis, unspecified organism: Secondary | ICD-10-CM | POA: Diagnosis not present

## 2020-01-22 DIAGNOSIS — K219 Gastro-esophageal reflux disease without esophagitis: Secondary | ICD-10-CM | POA: Diagnosis not present

## 2020-01-22 NOTE — Telephone Encounter (Signed)
Left message to call back  

## 2020-01-22 NOTE — Telephone Encounter (Signed)
Follow Up:   Wife is returning your call. 

## 2020-01-22 NOTE — Telephone Encounter (Signed)
Left message that they will be called tomorrow morning at 0830 to review instructions for visit with Dr. Burt Knack. Instructed them to have BP, HR and weight available if able.

## 2020-01-22 NOTE — Telephone Encounter (Signed)
Rep from hospice wants verbal to start service and wants to know if you will continue as attending physician. Please advise [facility]

## 2020-01-23 ENCOUNTER — Other Ambulatory Visit: Payer: Self-pay

## 2020-01-23 ENCOUNTER — Telehealth (INDEPENDENT_AMBULATORY_CARE_PROVIDER_SITE_OTHER): Admitting: Cardiovascular Disease

## 2020-01-23 ENCOUNTER — Encounter: Payer: Self-pay | Admitting: Cardiovascular Disease

## 2020-01-23 VITALS — BP 117/75 | HR 66 | Temp 97.6°F

## 2020-01-23 DIAGNOSIS — I2510 Atherosclerotic heart disease of native coronary artery without angina pectoris: Secondary | ICD-10-CM | POA: Diagnosis not present

## 2020-01-23 DIAGNOSIS — I359 Nonrheumatic aortic valve disorder, unspecified: Secondary | ICD-10-CM | POA: Diagnosis not present

## 2020-01-23 DIAGNOSIS — C7951 Secondary malignant neoplasm of bone: Secondary | ICD-10-CM | POA: Diagnosis not present

## 2020-01-23 DIAGNOSIS — A419 Sepsis, unspecified organism: Secondary | ICD-10-CM | POA: Diagnosis not present

## 2020-01-23 DIAGNOSIS — J690 Pneumonitis due to inhalation of food and vomit: Secondary | ICD-10-CM | POA: Diagnosis not present

## 2020-01-23 DIAGNOSIS — Z9181 History of falling: Secondary | ICD-10-CM | POA: Diagnosis not present

## 2020-01-23 DIAGNOSIS — E46 Unspecified protein-calorie malnutrition: Secondary | ICD-10-CM | POA: Diagnosis not present

## 2020-01-23 DIAGNOSIS — S32509S Unspecified fracture of unspecified pubis, sequela: Secondary | ICD-10-CM | POA: Diagnosis not present

## 2020-01-23 DIAGNOSIS — R2681 Unsteadiness on feet: Secondary | ICD-10-CM | POA: Diagnosis not present

## 2020-01-23 DIAGNOSIS — I25119 Atherosclerotic heart disease of native coronary artery with unspecified angina pectoris: Secondary | ICD-10-CM | POA: Diagnosis not present

## 2020-01-23 DIAGNOSIS — R269 Unspecified abnormalities of gait and mobility: Secondary | ICD-10-CM | POA: Diagnosis not present

## 2020-01-23 DIAGNOSIS — C61 Malignant neoplasm of prostate: Secondary | ICD-10-CM | POA: Diagnosis not present

## 2020-01-23 DIAGNOSIS — S329XXD Fracture of unspecified parts of lumbosacral spine and pelvis, subsequent encounter for fracture with routine healing: Secondary | ICD-10-CM | POA: Diagnosis not present

## 2020-01-23 DIAGNOSIS — M6281 Muscle weakness (generalized): Secondary | ICD-10-CM | POA: Diagnosis not present

## 2020-01-23 NOTE — Progress Notes (Signed)
Virtual Visit via Telephone Note   This visit type was conducted due to national recommendations for restrictions regarding the COVID-19 Pandemic (e.g. social distancing) in an effort to limit this patient's exposure and mitigate transmission in our community.  Due to his co-morbid illnesses, this patient is at least at moderate risk for complications without adequate follow up.  This format is felt to be most appropriate for this patient at this time.  All issues noted in this document were discussed and addressed.  A limited physical exam was performed with this format.  Please refer to the patient's chart for his consent to telehealth for [facility].   The patient was identified using 2 identifiers.  Date:  01/23/2020   ID:  Corey Oneill, DOB 1936-03-17, MRN [id number]  Patient Location: Home Provider Location: Office  PCP:  [name], MD  Cardiologist:  [name], MD  Electrophysiologist:  None   Evaluation Performed:  Follow-Up Visit  Chief Complaint: Fatigue  History of Present Illness:    Corey Oneill is a 84 y.o. male with coronary artery disease and aortic valve disease, presenting for follow-up evaluation.  The patient underwent stenting of the right coronary artery in the past.  He was treated with TAVR in 2017.  Other issues include chronic thrombocytopenia, prostate cancer status post external beam radiation for metastases to the left iliac bone, chronic diastolic heart failure, and seizure disorder.  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  His TAVR valve could not be visualized on transesophageal echo because of a large hiatal hernia.  He completed a course of IV antibiotics and then was transitioned to chronic suppressive antibiotics.  The patient was hospitalized in January for concern of TIA after he developed expressive aphasia.  He underwent a cardiac CT to evaluate his bioprosthetic  aortic valve.  This demonstrated no evidence of vegetation or thrombus and showed normal leaflet excursions without bioprosthetic leaflet thickening.  There is also no thrombus visualized in the left atrium or left atrial appendage.  He was hospitalized again from February 5 to February 10 with aspiration pneumonia.  He presents today for telehealth visit via [employer].  The patient does not have symptoms concerning for COVID-19 infection (fever, chills, cough, or new shortness of breath).  We tried to video conference today but had to convert to a telephone visit for technical reasons.  I spoke to the patient and also spoke to his wife.  He has been in assisted living facility now because he is requiring more care.  He complains of generalized fatigue and weight loss.  He is able to participate in physical therapy.  He is making some modest gains but remains pretty weak.  He denies chest pain or shortness of breath.  He denies heart palpitations, lightheadedness, or syncope.   Past Medical History:  Diagnosis Date  . Abdominal aortic atherosclerosis ([place])   . Anginal pain ([place])    last noted 08/17/19  . Bone cancer ([place])    Left hip  . BPPV (benign paroxysmal positional vertigo)   . Bullous pemphigoid   . CAD (coronary artery disease)    a.  LHC 8/16: Mid to distal LAD 30%, OM1 40%, proximal mid RCA 40%, distal RCA 60% >> FFR 0.69  >> PCI: 3 x 15 mm Resolute DES  . Depression   . Esophageal reflux   . Grade I diastolic dysfunction 123456   Noted on  ECHO   . H/O blood clots    "get them in my stool and urine" (11/12/2015)  . Heart murmur   . Hepatic lesion 12/08/2015   Stable 8 mm right hepatic lesion  . History of aortic stenosis    a. peak to peak gradient by LHC 8/16:  37 mmHg (moderately severe)   . History of appendicitis 2016  . History of blood transfusion 12/2018  . History of herpes labialis   . History of ITP    2018, thrombocytopenia  . History of  kidney stones   . Hx of radiation therapy 04/14/13-06/09/13   prostate 7800 cGy, 40 sessions, seminal vesicles 5600 cGy 40 sessions  . Hydrocele 2006   Small  . Hyperlipidemia   . Insomnia    resolved  . LVH (left ventricular hypertrophy)    Moderate, noted on ECHO 09/2018  . Malignant melanoma in situ ([place]) 09/04/2016   Right neck and chest  . Metastasis from malignant neoplasm of prostate ([place]) 02/25/2019  . Prostate cancer ([place]) dx'd 2014  . Radiation proctitis   . Renal mass 2017   Bilateral renal masses  . S/P skin biopsy 04/09/2019   subepidermal cell poor vesicle , Linear IGG and C3 the basement membrane  . Suprapubic catheter ([place])   . Wears hearing aid in both ears    Past Surgical History:  Procedure Laterality Date  . CARDIAC CATHETERIZATION N/A 04/21/2015   Procedure: Right/Left Heart Cath and Coronary Angiography;  Surgeon: [name], MD;  Location: [place] [facility];  Service: Cardiovascular;  Laterality: N/A;  . CARDIAC CATHETERIZATION N/A 06/18/2015   Procedure: Intravascular Pressure Wire/FFR Study;  Surgeon: [name], MD;  Location: [place] [facility];  Service: Cardiovascular;  Laterality: N/A;  . CARDIAC CATHETERIZATION N/A 06/18/2015   Procedure: Coronary Stent Intervention;  Surgeon: [name], MD;  Location: [place] CV [facility];  Service: Cardiovascular;  Laterality: N/A;  . CARDIAC CATHETERIZATION N/A 06/18/2015   Procedure: Right/Left Heart Cath and Coronary Angiography;  Surgeon: [name], MD;  Location: [place] [facility];  Service: Cardiovascular;  Laterality: N/A;  . CARDIAC CATHETERIZATION N/A 06/23/2015   Procedure: Left Heart Cath and Cors/Grafts Angiography;  Surgeon: [name], MD;  Location: [place] [facility];  Service: Cardiovascular;  Laterality: N/A;  . CARDIAC CATHETERIZATION N/A 01/17/2016   Procedure: Right/Left Heart Cath and Coronary Angiography;  Surgeon: [name], MD;  Location: [place] [facility];  Service:  Cardiovascular;  Laterality: N/A;  . CATARACT EXTRACTION, BILATERAL    . COLONOSCOPY  06/26/2017  . CYSTOSCOPY WITH BIOPSY N/A 07/30/2018   Procedure: CYSTOSCOPY WITH BIOPSY/FULGURATION, CYSTOLTHOLAPAXY;  Surgeon: [name], MD;  Location: [facility];  Service: Urology;  Laterality: N/A;  . CYSTOSCOPY WITH URETHRAL DILATATION N/A 08/18/2019   Procedure: CYSTOSCOPY WITH URETHRAL DILATATION USING BALLOON OR LASER/ SPURO PUBIC TUBE CHANGE LASER EXCISION OF URETHRAL STRICTURE RETROGRADE URETHROGRAM ANTEGRADE CYSTOGRAM;  Surgeon: [name], MD;  Location: [facility];  Service: Urology;  Laterality: N/A;  . FRACTURE SURGERY    . INGUINAL HERNIA REPAIR     patient does not remember this procedure  . LAPAROSCOPIC APPENDECTOMY N/A 11/16/2015   Procedure: APPENDECTOMY LAPAROSCOPIC;  Surgeon: [name], MD;  Location: [place];  Service: General;  Laterality: N/A;  . LEFT HEART CATH AND CORONARY ANGIOGRAPHY N/A 12/26/2016   Procedure: Left Heart Cath and Coronary Angiography;  Surgeon: Troy Sine, MD;  Location: [place] [facility];  Service: Cardiovascular;  Laterality: N/A;  . MELANOMA EXCISION Right 10/24/2016   Procedure: WIDE EXCISION MELANOMA RIGHT NECK AND RIGHT CHEST TIMES 2;  Surgeon: [name], MD;  Location: [facility];  Service: General;  Laterality: Right;  right medial and lateral lesion of chest and right neck  . NASAL FRACTURE SURGERY     "broken years ago; several ORs to correct it"  . NASAL SEPTUM SURGERY    . PROSTATE BIOPSY  2014   "needle biopsy"  . TEE WITHOUT CARDIOVERSION N/A 02/15/2016   Procedure: TRANSESOPHAGEAL ECHOCARDIOGRAM (TEE);  Surgeon: [name], MD;  Location: [facility];  Service: Open Heart Surgery;  Laterality: N/A;  . TEE WITHOUT CARDIOVERSION N/A 08/26/2019   Procedure: TRANSESOPHAGEAL ECHOCARDIOGRAM (TEE);  Surgeon: Pixie Casino, MD;  Location: [facility];  Service: Cardiovascular;  Laterality: N/A;  .  TONSILLECTOMY    . TRANSCATHETER AORTIC VALVE REPLACEMENT, TRANSFEMORAL  02/15/2016  . TRANSCATHETER AORTIC VALVE REPLACEMENT, TRANSFEMORAL N/A 02/15/2016   Procedure: TRANSCATHETER AORTIC VALVE REPLACEMENT, TRANSFEMORAL;  Surgeon: [name], MD;  Location: [place];  Service: Open Heart Surgery;  Laterality: N/A;  . TRANSURETHRAL RESECTION OF PROSTATE  12/23/2018   Procedure: CYSTOSCOPY WITH  BLADDER BIOPSY WITH FULGERATION/ TRANSURETHRAL RESECTION PROSTATE  AND PROSTATE BIOPSY;  Surgeon: [name], MD;  Location: [facility];  Service: Urology;;     Current Meds  Medication Sig  . acetaminophen (TYLENOL) 500 MG tablet Take 500 mg by mouth every 6 (six) hours as needed for mild pain or moderate pain.   [name] amoxicillin (AMOXIL) 500 MG tablet Take 1 tablet (500 mg total) by mouth 2 (two) times daily.  [name] aspirin EC 81 MG tablet Take 81 mg by mouth every evening.   . Calcium-Phosphorus-Vitamin D (CITRACAL +D3 PO) Take 1 tablet by mouth at bedtime.  . carvedilol (COREG) 3.125 MG tablet TAKE 1 TABLET BY MOUTH TWICE DAILY.  . cyanocobalamin 1000 MCG tablet Take 1,000 mcg by mouth daily.  [name] doxycycline (MONODOX) 100 MG capsule Take 100 mg by mouth 2 (two) times daily.   . DULoxetine (CYMBALTA) 60 MG capsule Take 1 capsule (60 mg total) by mouth daily.  . enzalutamide (XTANDI) 40 MG capsule Take 160 mg by mouth daily with supper.   . ferrous sulfate (FEROSUL) 325 (65 FE) MG tablet Take 325 mg by mouth daily with breakfast.  . folic acid (FOLVITE) 1 MG tablet Take 1 mg by mouth See admin instructions. Take 1 mg by mouth once a day on Sun/Mon/Wed/Thurs/Fri/Sat (not on Tuesdays)  . ibuprofen (ADVIL) 200 MG tablet Take 200-400 mg by mouth every 6 (six) hours as needed (for pain or inflammation).  [name] lamoTRIgine (LAMICTAL) 25 MG tablet One tablet twice a day for 2 weeks, then take 2 tablets twice a day for 2 weeks, then take 3 tablets twice a day  . MAGNESIUM PO Take 1 tablet by mouth daily.   .  methotrexate (RHEUMATREX) 2.5 MG tablet Take 10 mg by mouth every Tuesday.   . MULTIPLE VITAMIN PO Take 1 tablet by mouth daily.  . nitroGLYCERIN (NITROSTAT) 0.4 MG SL tablet 1 TAB UNDER TONGUE AS NEEDED FOR CHEST PAIN. MAY REPEAT EVERY 5 MIN FOR A TOTAL OF 3 DOSES.  [name] polyethylene glycol (MIRALAX / GLYCOLAX) 17 g packet Take 17 g by mouth daily as needed for mild constipation.  . rosuvastatin (CRESTOR) 10 MG tablet TAKE 1 TABLET ONCE DAILY.  . traMADol (ULTRAM) 50 MG tablet Take 1 tablet (50 mg  total) by mouth every 6 (six) hours as needed (for pain).     Allergies:   Prednisone   Social History   Tobacco Use  . Smoking status: Former Smoker    Packs/day: 0.00    Types: Cigarettes  . Smokeless tobacco: Never Used  . Tobacco comment: "quit smoking cigarettes in the 1960s; quit smoking cigars in the 1990s  Substance Use Topics  . Alcohol use: Yes    Comment: 2 DRINKS PER DAY  . Drug use: No     Family Hx: The patient's family history includes Brain cancer in his father; Depression in his daughter; Lymphoma in his mother.  ROS:   Please see the history of present illness.    All other systems reviewed and are negative.   Prior CV studies:   The following studies were reviewed today:  CT Cardiac 11-15-19: FINDINGS: Aortic Valve: 26 mm Edwards-SAPIEN 3 valve is seen in the aortic position. Leaflets have normal thickness, normal excursions and no evidence for a vegetation or a thrombus.  Aorta: Normal size with mild diffuse atherosclerotic plaque and calcifications.  Sinotubular Junction: 32 x 28 mm  Ascending Thoracic Aorta: 35 x 34 mm  Aortic Arch: Not visualized  Descending Thoracic Aorta: 21 x 21 mm  IMPRESSION: 1. 26 mm Edwards-SAPIEN 3 valve is seen in the aortic position. Leaflets have normal thickness, normal excursions and no evidence for a vegetation or a thrombus.  2. Mitral valve leaflets have no evidence for vegetation.  3. No thrombus is seen  in the left atrium or in the left atrial appendage.  Echo 11-15-19: IMPRESSIONS    1. Left ventricular ejection fraction, by visual estimation, is 65 to  70%.  2. Left ventricular diastolic parameters are consistent with Grade I  diastolic dysfunction (impaired relaxation).  3. Mild mitral annular calcification.  4. Mild mitral valve regurgitation.  5. Moderate thickening of the mitral valve leaflet(s).  6. Tricuspid valve regurgitation is mild.  7. Tricuspid valve regurgitation is mild.  8. Bioprosthetic TAVR valve leaflets appear thickened - this is new from  the study on 09/26/2019. Transaortic gradients couldn't be appropriately  assessed. TEE is recommended.   Labs/Other Tests and Data Reviewed:    EKG:  No ECG reviewed.  Recent Labs: 09/25/2019: TSH 1.620 09/30/2019: Magnesium 2.4 12/21/2019: ALT 9 12/22/2019: BUN 27; Creatinine, Ser 0.79; Hemoglobin 9.7; Platelets 98; Potassium 4.1; Sodium 133   Recent Lipid Panel Lab Results  Component Value Date/Time   CHOL 166 11/15/2019 12:02 AM   TRIG 60 11/15/2019 12:02 AM   HDL 46 11/15/2019 12:02 AM   CHOLHDL 3.6 11/15/2019 12:02 AM   LDLCALC 108 (H) 11/15/2019 12:02 AM    Wt Readings from Last 3 Encounters:  01/16/20 118 lb (53.5 kg)  12/20/19 132 lb 0.9 oz (59.9 kg)  12/01/19 132 lb (59.9 kg)     Objective:    Vital Signs:  BP 117/75   Pulse 66   Temp 97.6 F (36.4 C)    VITAL SIGNS:  reviewed The remainder of the exam is deferred as this is a telephone visit.  The patient is breathing comfortably in normal conversation.  ASSESSMENT & PLAN:    1. Coronary artery disease, native vessel, without angina: The patient appears stable from a coronary standpoint.  His medicines are reviewed and will be continued without change. 2. Aortic valve disease status post TAVR: See HPI for extensive discussion about his recent bacteremia and evaluation of his valve with both TEE  and gated cardiac CTA.  There is no clear  evidence of endocarditis or any valvular vegetation or dysfunction.  He is now on chronic suppressive antibiotics.  The patient appears stable from a cardiac perspective.  Unfortunately he continues to have some general decline with weight loss and increased frailty.  He has been hospitalized multiple times in the last 6 months for a variety of reasons including TIA, aspiration pneumonia, and bacteremia.  I have not recommended any changes in his medical program today.  We will plan to see him back in 6 months either in person or via a virtual visit.  He understands that I am available if he has any cardiac-related problems.  COVID-19 Education: The signs and symptoms of COVID-19 were discussed with the patient and how to seek care for testing (follow up with PCP or arrange E-visit).  The importance of social distancing was discussed today.  Time:   Today, I have spent 17 minutes with the patient with telehealth technology discussing the above problems.     Medication Adjustments/Labs and Tests Ordered: Current medicines are reviewed at length with the patient today.  Concerns regarding medicines are outlined above.   Tests Ordered: No orders of the defined types were placed in this encounter.   Medication Changes: No orders of the defined types were placed in this encounter.   Follow Up:  Either In Person or Virtual in 6 month(s)  Signed, [name], MD  01/23/2020 9:13 AM    [place]

## 2020-01-23 NOTE — Patient Instructions (Signed)
Medication Instructions:  Your provider recommends that you continue on your current medications as directed. Please refer to the Current Medication list given to you today.     Follow-Up: You will be called to arrange your 4 month in-person visit with Dr. Burt Knack when his schedule for July is available.

## 2020-01-24 DIAGNOSIS — J690 Pneumonitis due to inhalation of food and vomit: Secondary | ICD-10-CM | POA: Diagnosis not present

## 2020-01-24 DIAGNOSIS — C7951 Secondary malignant neoplasm of bone: Secondary | ICD-10-CM | POA: Diagnosis not present

## 2020-01-24 DIAGNOSIS — E46 Unspecified protein-calorie malnutrition: Secondary | ICD-10-CM | POA: Diagnosis not present

## 2020-01-24 DIAGNOSIS — C61 Malignant neoplasm of prostate: Secondary | ICD-10-CM | POA: Diagnosis not present

## 2020-01-24 DIAGNOSIS — I25119 Atherosclerotic heart disease of native coronary artery with unspecified angina pectoris: Secondary | ICD-10-CM | POA: Diagnosis not present

## 2020-01-24 DIAGNOSIS — S329XXD Fracture of unspecified parts of lumbosacral spine and pelvis, subsequent encounter for fracture with routine healing: Secondary | ICD-10-CM | POA: Diagnosis not present

## 2020-01-26 ENCOUNTER — Telehealth: Payer: Self-pay

## 2020-01-26 ENCOUNTER — Telehealth: Payer: Self-pay | Admitting: Internal Medicine

## 2020-01-26 DIAGNOSIS — M6281 Muscle weakness (generalized): Secondary | ICD-10-CM | POA: Diagnosis not present

## 2020-01-26 DIAGNOSIS — R269 Unspecified abnormalities of gait and mobility: Secondary | ICD-10-CM | POA: Diagnosis not present

## 2020-01-26 DIAGNOSIS — R2681 Unsteadiness on feet: Secondary | ICD-10-CM | POA: Diagnosis not present

## 2020-01-26 DIAGNOSIS — Z9181 History of falling: Secondary | ICD-10-CM | POA: Diagnosis not present

## 2020-01-26 DIAGNOSIS — S32509S Unspecified fracture of unspecified pubis, sequela: Secondary | ICD-10-CM | POA: Diagnosis not present

## 2020-01-26 DIAGNOSIS — A419 Sepsis, unspecified organism: Secondary | ICD-10-CM | POA: Diagnosis not present

## 2020-01-26 NOTE — Telephone Encounter (Signed)
Note entered for 01/23/20: Received phone call from patient's daughter, [name]. [name] shared that patient was admitted to Hospice services but she was unaware that ongoing rehab would stop. [name] stated she wanted to stop hospice and go back to Palliative care. [name] shared that a hospital bed was delivered and she would like patient to keep this. Will follow up with Hospice team for clarificaton

## 2020-01-26 NOTE — Telephone Encounter (Signed)
Pt called and would like you to call him about his stage 4 cancer diagnoses and has questions about this

## 2020-01-26 NOTE — Telephone Encounter (Signed)
Received update that patient's daughter revoked hospice services on 01/24/20 and would like for patient to continue with Palliative Care. Message sent to PCP to confirm agreement with Palliative Care. Also, per daughter's request, order for hospital bed requested.

## 2020-01-26 NOTE — Telephone Encounter (Signed)
Note entered for 01/23/20:Spoke with Brayton Layman, [place] with hospice. Brayton Layman has visit scheduled with patient's daughter to revoke hospice services.

## 2020-01-28 DIAGNOSIS — R2681 Unsteadiness on feet: Secondary | ICD-10-CM | POA: Diagnosis not present

## 2020-01-28 DIAGNOSIS — R269 Unspecified abnormalities of gait and mobility: Secondary | ICD-10-CM | POA: Diagnosis not present

## 2020-01-28 DIAGNOSIS — A419 Sepsis, unspecified organism: Secondary | ICD-10-CM | POA: Diagnosis not present

## 2020-01-28 DIAGNOSIS — Z9181 History of falling: Secondary | ICD-10-CM | POA: Diagnosis not present

## 2020-01-28 DIAGNOSIS — S32509S Unspecified fracture of unspecified pubis, sequela: Secondary | ICD-10-CM | POA: Diagnosis not present

## 2020-01-28 DIAGNOSIS — M6281 Muscle weakness (generalized): Secondary | ICD-10-CM | POA: Diagnosis not present

## 2020-01-29 DIAGNOSIS — R269 Unspecified abnormalities of gait and mobility: Secondary | ICD-10-CM | POA: Diagnosis not present

## 2020-01-29 DIAGNOSIS — M6281 Muscle weakness (generalized): Secondary | ICD-10-CM | POA: Diagnosis not present

## 2020-01-29 DIAGNOSIS — S32509S Unspecified fracture of unspecified pubis, sequela: Secondary | ICD-10-CM | POA: Diagnosis not present

## 2020-01-29 DIAGNOSIS — A419 Sepsis, unspecified organism: Secondary | ICD-10-CM | POA: Diagnosis not present

## 2020-01-29 DIAGNOSIS — R2681 Unsteadiness on feet: Secondary | ICD-10-CM | POA: Diagnosis not present

## 2020-01-29 DIAGNOSIS — Z9181 History of falling: Secondary | ICD-10-CM | POA: Diagnosis not present

## 2020-01-30 DIAGNOSIS — M6281 Muscle weakness (generalized): Secondary | ICD-10-CM | POA: Diagnosis not present

## 2020-01-30 DIAGNOSIS — A419 Sepsis, unspecified organism: Secondary | ICD-10-CM | POA: Diagnosis not present

## 2020-01-30 DIAGNOSIS — R269 Unspecified abnormalities of gait and mobility: Secondary | ICD-10-CM | POA: Diagnosis not present

## 2020-01-30 DIAGNOSIS — Z9181 History of falling: Secondary | ICD-10-CM | POA: Diagnosis not present

## 2020-01-30 DIAGNOSIS — S32509S Unspecified fracture of unspecified pubis, sequela: Secondary | ICD-10-CM | POA: Diagnosis not present

## 2020-01-30 DIAGNOSIS — R2681 Unsteadiness on feet: Secondary | ICD-10-CM | POA: Diagnosis not present

## 2020-02-02 DIAGNOSIS — M6281 Muscle weakness (generalized): Secondary | ICD-10-CM | POA: Diagnosis not present

## 2020-02-02 DIAGNOSIS — Z9181 History of falling: Secondary | ICD-10-CM | POA: Diagnosis not present

## 2020-02-02 DIAGNOSIS — R269 Unspecified abnormalities of gait and mobility: Secondary | ICD-10-CM | POA: Diagnosis not present

## 2020-02-02 DIAGNOSIS — S32509S Unspecified fracture of unspecified pubis, sequela: Secondary | ICD-10-CM | POA: Diagnosis not present

## 2020-02-02 DIAGNOSIS — R2681 Unsteadiness on feet: Secondary | ICD-10-CM | POA: Diagnosis not present

## 2020-02-02 DIAGNOSIS — A419 Sepsis, unspecified organism: Secondary | ICD-10-CM | POA: Diagnosis not present

## 2020-02-03 DIAGNOSIS — M6281 Muscle weakness (generalized): Secondary | ICD-10-CM | POA: Diagnosis not present

## 2020-02-03 DIAGNOSIS — R269 Unspecified abnormalities of gait and mobility: Secondary | ICD-10-CM | POA: Diagnosis not present

## 2020-02-03 DIAGNOSIS — A419 Sepsis, unspecified organism: Secondary | ICD-10-CM | POA: Diagnosis not present

## 2020-02-03 DIAGNOSIS — S32509S Unspecified fracture of unspecified pubis, sequela: Secondary | ICD-10-CM | POA: Diagnosis not present

## 2020-02-03 DIAGNOSIS — R2681 Unsteadiness on feet: Secondary | ICD-10-CM | POA: Diagnosis not present

## 2020-02-03 DIAGNOSIS — Z9181 History of falling: Secondary | ICD-10-CM | POA: Diagnosis not present

## 2020-02-04 DIAGNOSIS — M6281 Muscle weakness (generalized): Secondary | ICD-10-CM | POA: Diagnosis not present

## 2020-02-04 DIAGNOSIS — R2681 Unsteadiness on feet: Secondary | ICD-10-CM | POA: Diagnosis not present

## 2020-02-04 DIAGNOSIS — S32509S Unspecified fracture of unspecified pubis, sequela: Secondary | ICD-10-CM | POA: Diagnosis not present

## 2020-02-04 DIAGNOSIS — A419 Sepsis, unspecified organism: Secondary | ICD-10-CM | POA: Diagnosis not present

## 2020-02-04 DIAGNOSIS — R269 Unspecified abnormalities of gait and mobility: Secondary | ICD-10-CM | POA: Diagnosis not present

## 2020-02-04 DIAGNOSIS — Z9181 History of falling: Secondary | ICD-10-CM | POA: Diagnosis not present

## 2020-02-05 ENCOUNTER — Telehealth: Payer: Self-pay

## 2020-02-05 DIAGNOSIS — S32509S Unspecified fracture of unspecified pubis, sequela: Secondary | ICD-10-CM | POA: Diagnosis not present

## 2020-02-05 DIAGNOSIS — A419 Sepsis, unspecified organism: Secondary | ICD-10-CM | POA: Diagnosis not present

## 2020-02-05 DIAGNOSIS — Z9181 History of falling: Secondary | ICD-10-CM | POA: Diagnosis not present

## 2020-02-05 DIAGNOSIS — R269 Unspecified abnormalities of gait and mobility: Secondary | ICD-10-CM | POA: Diagnosis not present

## 2020-02-05 DIAGNOSIS — R2681 Unsteadiness on feet: Secondary | ICD-10-CM | POA: Diagnosis not present

## 2020-02-05 DIAGNOSIS — M6281 Muscle weakness (generalized): Secondary | ICD-10-CM | POA: Diagnosis not present

## 2020-02-05 NOTE — Telephone Encounter (Signed)
I have no clue what you are talking about

## 2020-02-05 NOTE — Telephone Encounter (Signed)
Note sent to [facility] .

## 2020-02-05 NOTE — Telephone Encounter (Signed)
Mr Stedman called and seemed to be confuse concerning a letter form our office . Pt advised it was about his oncology doctor. Please advise if I need to call his daughter [name] concerning this matter. [place] pt can be contacted at [phone]

## 2020-02-05 NOTE — Telephone Encounter (Signed)
Error

## 2020-02-06 DIAGNOSIS — R2681 Unsteadiness on feet: Secondary | ICD-10-CM | POA: Diagnosis not present

## 2020-02-06 DIAGNOSIS — S32509S Unspecified fracture of unspecified pubis, sequela: Secondary | ICD-10-CM | POA: Diagnosis not present

## 2020-02-06 DIAGNOSIS — M6281 Muscle weakness (generalized): Secondary | ICD-10-CM | POA: Diagnosis not present

## 2020-02-06 DIAGNOSIS — A419 Sepsis, unspecified organism: Secondary | ICD-10-CM | POA: Diagnosis not present

## 2020-02-06 DIAGNOSIS — R269 Unspecified abnormalities of gait and mobility: Secondary | ICD-10-CM | POA: Diagnosis not present

## 2020-02-06 DIAGNOSIS — Z9181 History of falling: Secondary | ICD-10-CM | POA: Diagnosis not present

## 2020-02-09 DIAGNOSIS — A419 Sepsis, unspecified organism: Secondary | ICD-10-CM | POA: Diagnosis not present

## 2020-02-09 DIAGNOSIS — S32509S Unspecified fracture of unspecified pubis, sequela: Secondary | ICD-10-CM | POA: Diagnosis not present

## 2020-02-09 DIAGNOSIS — Z9181 History of falling: Secondary | ICD-10-CM | POA: Diagnosis not present

## 2020-02-09 DIAGNOSIS — R269 Unspecified abnormalities of gait and mobility: Secondary | ICD-10-CM | POA: Diagnosis not present

## 2020-02-09 DIAGNOSIS — R2681 Unsteadiness on feet: Secondary | ICD-10-CM | POA: Diagnosis not present

## 2020-02-09 DIAGNOSIS — M6281 Muscle weakness (generalized): Secondary | ICD-10-CM | POA: Diagnosis not present

## 2020-02-11 ENCOUNTER — Other Ambulatory Visit: Payer: Self-pay | Admitting: Physician Assistant

## 2020-02-11 DIAGNOSIS — S32509S Unspecified fracture of unspecified pubis, sequela: Secondary | ICD-10-CM | POA: Diagnosis not present

## 2020-02-11 DIAGNOSIS — R269 Unspecified abnormalities of gait and mobility: Secondary | ICD-10-CM | POA: Diagnosis not present

## 2020-02-11 DIAGNOSIS — A419 Sepsis, unspecified organism: Secondary | ICD-10-CM | POA: Diagnosis not present

## 2020-02-11 DIAGNOSIS — Z9181 History of falling: Secondary | ICD-10-CM | POA: Diagnosis not present

## 2020-02-11 DIAGNOSIS — R2681 Unsteadiness on feet: Secondary | ICD-10-CM | POA: Diagnosis not present

## 2020-02-11 DIAGNOSIS — M6281 Muscle weakness (generalized): Secondary | ICD-10-CM | POA: Diagnosis not present

## 2020-02-12 DIAGNOSIS — R269 Unspecified abnormalities of gait and mobility: Secondary | ICD-10-CM | POA: Diagnosis not present

## 2020-02-12 DIAGNOSIS — Z8616 Personal history of COVID-19: Secondary | ICD-10-CM | POA: Diagnosis not present

## 2020-02-12 DIAGNOSIS — A419 Sepsis, unspecified organism: Secondary | ICD-10-CM | POA: Diagnosis not present

## 2020-02-12 DIAGNOSIS — R1313 Dysphagia, pharyngeal phase: Secondary | ICD-10-CM | POA: Diagnosis not present

## 2020-02-12 DIAGNOSIS — R2681 Unsteadiness on feet: Secondary | ICD-10-CM | POA: Diagnosis not present

## 2020-02-12 DIAGNOSIS — Z9181 History of falling: Secondary | ICD-10-CM | POA: Diagnosis not present

## 2020-02-12 DIAGNOSIS — S32509S Unspecified fracture of unspecified pubis, sequela: Secondary | ICD-10-CM | POA: Diagnosis not present

## 2020-02-12 DIAGNOSIS — M6281 Muscle weakness (generalized): Secondary | ICD-10-CM | POA: Diagnosis not present

## 2020-02-12 DIAGNOSIS — F0150 Vascular dementia without behavioral disturbance: Secondary | ICD-10-CM | POA: Diagnosis not present

## 2020-02-13 DIAGNOSIS — S32509S Unspecified fracture of unspecified pubis, sequela: Secondary | ICD-10-CM | POA: Diagnosis not present

## 2020-02-13 DIAGNOSIS — Z9181 History of falling: Secondary | ICD-10-CM | POA: Diagnosis not present

## 2020-02-13 DIAGNOSIS — M6281 Muscle weakness (generalized): Secondary | ICD-10-CM | POA: Diagnosis not present

## 2020-02-13 DIAGNOSIS — R1313 Dysphagia, pharyngeal phase: Secondary | ICD-10-CM | POA: Diagnosis not present

## 2020-02-13 DIAGNOSIS — R2681 Unsteadiness on feet: Secondary | ICD-10-CM | POA: Diagnosis not present

## 2020-02-13 DIAGNOSIS — R269 Unspecified abnormalities of gait and mobility: Secondary | ICD-10-CM | POA: Diagnosis not present

## 2020-02-16 DIAGNOSIS — M6281 Muscle weakness (generalized): Secondary | ICD-10-CM | POA: Diagnosis not present

## 2020-02-16 DIAGNOSIS — C7951 Secondary malignant neoplasm of bone: Secondary | ICD-10-CM | POA: Diagnosis not present

## 2020-02-16 DIAGNOSIS — Z9181 History of falling: Secondary | ICD-10-CM | POA: Diagnosis not present

## 2020-02-16 DIAGNOSIS — C61 Malignant neoplasm of prostate: Secondary | ICD-10-CM | POA: Diagnosis not present

## 2020-02-16 DIAGNOSIS — N35012 Post-traumatic membranous urethral stricture: Secondary | ICD-10-CM | POA: Diagnosis not present

## 2020-02-16 DIAGNOSIS — S32509S Unspecified fracture of unspecified pubis, sequela: Secondary | ICD-10-CM | POA: Diagnosis not present

## 2020-02-16 DIAGNOSIS — R1313 Dysphagia, pharyngeal phase: Secondary | ICD-10-CM | POA: Diagnosis not present

## 2020-02-16 DIAGNOSIS — R2681 Unsteadiness on feet: Secondary | ICD-10-CM | POA: Diagnosis not present

## 2020-02-16 DIAGNOSIS — R269 Unspecified abnormalities of gait and mobility: Secondary | ICD-10-CM | POA: Diagnosis not present

## 2020-02-17 DIAGNOSIS — S32509S Unspecified fracture of unspecified pubis, sequela: Secondary | ICD-10-CM | POA: Diagnosis not present

## 2020-02-17 DIAGNOSIS — M6281 Muscle weakness (generalized): Secondary | ICD-10-CM | POA: Diagnosis not present

## 2020-02-17 DIAGNOSIS — R269 Unspecified abnormalities of gait and mobility: Secondary | ICD-10-CM | POA: Diagnosis not present

## 2020-02-17 DIAGNOSIS — Z9181 History of falling: Secondary | ICD-10-CM | POA: Diagnosis not present

## 2020-02-17 DIAGNOSIS — R2681 Unsteadiness on feet: Secondary | ICD-10-CM | POA: Diagnosis not present

## 2020-02-17 DIAGNOSIS — R1313 Dysphagia, pharyngeal phase: Secondary | ICD-10-CM | POA: Diagnosis not present

## 2020-02-18 ENCOUNTER — Inpatient Hospital Stay: Payer: Medicare Other | Attending: Oncology | Admitting: Oncology

## 2020-02-18 ENCOUNTER — Inpatient Hospital Stay: Payer: Medicare Other

## 2020-02-18 ENCOUNTER — Other Ambulatory Visit: Payer: Self-pay

## 2020-02-18 VITALS — BP 116/53 | HR 75 | Temp 98.2°F | Resp 18 | Ht 70.0 in | Wt 116.5 lb

## 2020-02-18 DIAGNOSIS — Z9181 History of falling: Secondary | ICD-10-CM | POA: Diagnosis not present

## 2020-02-18 DIAGNOSIS — L120 Bullous pemphigoid: Secondary | ICD-10-CM | POA: Insufficient documentation

## 2020-02-18 DIAGNOSIS — R2681 Unsteadiness on feet: Secondary | ICD-10-CM | POA: Diagnosis not present

## 2020-02-18 DIAGNOSIS — C61 Malignant neoplasm of prostate: Secondary | ICD-10-CM | POA: Diagnosis not present

## 2020-02-18 DIAGNOSIS — C7951 Secondary malignant neoplasm of bone: Secondary | ICD-10-CM | POA: Insufficient documentation

## 2020-02-18 DIAGNOSIS — Z791 Long term (current) use of non-steroidal anti-inflammatories (NSAID): Secondary | ICD-10-CM | POA: Insufficient documentation

## 2020-02-18 DIAGNOSIS — Z7982 Long term (current) use of aspirin: Secondary | ICD-10-CM | POA: Insufficient documentation

## 2020-02-18 DIAGNOSIS — I2510 Atherosclerotic heart disease of native coronary artery without angina pectoris: Secondary | ICD-10-CM

## 2020-02-18 DIAGNOSIS — Z79899 Other long term (current) drug therapy: Secondary | ICD-10-CM | POA: Insufficient documentation

## 2020-02-18 DIAGNOSIS — R269 Unspecified abnormalities of gait and mobility: Secondary | ICD-10-CM | POA: Diagnosis not present

## 2020-02-18 DIAGNOSIS — M6281 Muscle weakness (generalized): Secondary | ICD-10-CM | POA: Diagnosis not present

## 2020-02-18 DIAGNOSIS — Z85820 Personal history of malignant melanoma of skin: Secondary | ICD-10-CM | POA: Insufficient documentation

## 2020-02-18 DIAGNOSIS — R1313 Dysphagia, pharyngeal phase: Secondary | ICD-10-CM | POA: Diagnosis not present

## 2020-02-18 DIAGNOSIS — D693 Immune thrombocytopenic purpura: Secondary | ICD-10-CM | POA: Insufficient documentation

## 2020-02-18 DIAGNOSIS — S32509S Unspecified fracture of unspecified pubis, sequela: Secondary | ICD-10-CM | POA: Diagnosis not present

## 2020-02-18 NOTE — Progress Notes (Signed)
Hematology and Oncology Follow Up Visit  Corey Oneill:[id number] May 01, 1936 84 y.o. 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     Secondary diagnoses:  1.  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    2.  Melanoma of the chest wall diagnosed in 2017 status post surgical excision without any additional treatment.   3.  ITP diagnosed in 2018.   Prior Therapy:   He is status post wide excision of his melanoma in December 2017.  Prednisone therapy currently at 60 mg daily started in February 2018 and was tapered to off in May 2018.  Current therapy:   Lupron every 6 months under the care of Dr. Junious Silk.  Xtandi 160 mg daily started by Dr. Junious Silk April 2020.  Interim History:  Corey Oneill is here for a follow-up visit.  Since the last visit, he was hospitalized in February 2021 for aspiration pneumonia and was discharged to a skilled nursing facility.  Currently he remains overall frail and receiving acute rehabilitation and have not been discharged home.  He is ambulating with assistance without any falls or syncope.  He denies any chest pain or shortness of breath.  He denies difficulty breathing.  His performance status remains marginal at this time.            Medications: Unchanged on review. Current Outpatient Medications  Medication Sig Dispense Refill  . acetaminophen (TYLENOL) 500 MG tablet Take 500 mg by mouth every 6 (six) hours as needed for mild pain or moderate pain.     [name] amoxicillin (AMOXIL) 500 MG tablet Take 1 tablet (500 mg total) by mouth 2 (two) times daily. 60 tablet 11  . aspirin EC 81 MG tablet Take 81 mg by mouth every evening.     . Calcium-Phosphorus-Vitamin D (CITRACAL +D3 PO) Take 1 tablet by mouth at bedtime.    . carvedilol (COREG) 3.125 MG tablet TAKE 1 TABLET BY MOUTH TWICE  DAILY. 60 tablet 2  . cyanocobalamin 1000 MCG tablet Take 1,000 mcg by mouth daily.    [name] doxycycline (MONODOX) 100 MG capsule Take 100 mg by mouth 2 (two) times daily.     . DULoxetine (CYMBALTA) 60 MG capsule Take 1 capsule (60 mg total) by mouth daily. 30 capsule 3  . enzalutamide (XTANDI) 40 MG capsule Take 160 mg by mouth daily with supper.     . ferrous sulfate (FEROSUL) 325 (65 FE) MG tablet Take 325 mg by mouth daily with breakfast.    . folic acid (FOLVITE) 1 MG tablet Take 1 mg by mouth See admin instructions. Take 1 mg by mouth once a day on Sun/Mon/Wed/Thurs/Fri/Sat (not on Tuesdays)    . ibuprofen (ADVIL) 200 MG tablet Take 200-400 mg by mouth every 6 (six) hours as needed (for pain or inflammation).    . isosorbide mononitrate (IMDUR) 30 MG 24 hr tablet TAKE 1 TABLET ONCE DAILY. 90 tablet 3  . lamoTRIgine (LAMICTAL) 25 MG tablet One tablet twice a day for 2 weeks, then take 2 tablets twice a day for 2 weeks, then take 3 tablets twice a day 180 tablet 1  . MAGNESIUM PO Take 1 tablet by mouth daily.     . methotrexate (RHEUMATREX) 2.5 MG tablet Take 10 mg by mouth every Tuesday.     . MULTIPLE VITAMIN PO Take 1 tablet by mouth  daily.    . nitroGLYCERIN (NITROSTAT) 0.4 MG SL tablet 1 TAB UNDER TONGUE AS NEEDED FOR CHEST PAIN. MAY REPEAT EVERY 5 MIN FOR A TOTAL OF 3 DOSES. 25 tablet 2  . polyethylene glycol (MIRALAX / GLYCOLAX) 17 g packet Take 17 g by mouth daily as needed for mild constipation. 14 each 0  . rosuvastatin (CRESTOR) 10 MG tablet TAKE 1 TABLET ONCE DAILY. 90 tablet 0  . traMADol (ULTRAM) 50 MG tablet Take 1 tablet (50 mg total) by mouth every 6 (six) hours as needed (for pain). 20 tablet 0   No current facility-administered medications for this visit.     Allergies:  Allergies  Allergen Reactions  . Prednisone Other (See Comments)    Makes the patient depressed   Physical Exam:    Blood pressure (!) 116/53, pulse 75, temperature 98.2 F (36.8 C), temperature  source Temporal, resp. rate 18, height 5\' 10"  (1.778 m), weight 116 lb 8 oz (52.8 kg), SpO2 98 %.     ECOG: 1     General appearance:  Frail gentleman appearing without distress. Head: Normocephalic without any trauma Oropharynx: Mucous membranes are moist and pink without any thrush or ulcers. Eyes: Pupils are equal and round reactive to light. Lymph nodes: No cervical, supraclavicular, inguinal or axillary lymphadenopathy.   Heart:regular rate and rhythm.  S1 and S2 without leg edema. Oneill: Clear without any rhonchi or wheezes.  No dullness to percussion. Abdomin: Soft, nontender, nondistended with good bowel sounds.  No hepatosplenomegaly. Musculoskeletal: No joint deformity or effusion.  Full range of motion noted. Neurological: No deficits noted on motor, sensory and deep tendon reflex exam. Skin: No petechial rash or dryness.  Appeared moist.         Lab Results: Lab Results  Component Value Date   WBC 4.0 12/22/2019   HGB 9.7 (L) 12/22/2019   HCT 28.2 (L) 12/22/2019   MCV 94.9 12/22/2019   PLT 98 (L) 12/22/2019     Chemistry      Component Value Date/Time   NA 133 (L) 12/22/2019 0301   NA 135 12/16/2019 1341   NA 141 09/21/2017 1101   K 4.1 12/22/2019 0301   K 4.4 09/21/2017 1101   CL 101 12/22/2019 0301   CO2 25 12/22/2019 0301   CO2 29 09/21/2017 1101   BUN 27 (H) 12/22/2019 0301   BUN 18 12/16/2019 1341   BUN 18.7 09/21/2017 1101   CREATININE 0.79 12/22/2019 0301   CREATININE 0.82 11/18/2019 1338   CREATININE 0.9 09/21/2017 1101      Component Value Date/Time   CALCIUM 8.2 (L) 12/22/2019 0301   CALCIUM 8.7 09/21/2017 1101   ALKPHOS 63 12/21/2019 0407   ALKPHOS 79 09/21/2017 1101   AST 15 12/21/2019 0407   AST 14 (L) 11/18/2019 1338   AST 22 09/21/2017 1101   ALT 9 12/21/2019 0407   ALT 6 11/18/2019 1338   ALT 16 09/21/2017 1101   BILITOT 0.8 12/21/2019 0407   BILITOT 0.4 12/16/2019 1341   BILITOT 0.4 11/18/2019 1338   BILITOT 0.70  09/21/2017 1101       Impression and Plan:  84 year old man with:       1.  Castration-resistant prostate cancer with disease to the bone that is rather limited.  He is currently on Xtandi with disease reasonably controlled.  CT scan obtained on December 19, 2019 were personally reviewed which showed no evidence of widespread metastasis.  CT scan of the pelvis did show  nondisplaced fracture of the pubic ramus with sclerotic lesion in the left acetabulum of the right iliac bone.  His PSA on January 5 was 2.8.  The natural course of this disease was reviewed.  He has no clear-cut evidence of rapid progression or any need for changing in his treatment.  Given his overall frail status prostate cancer appears to be less of a contributor to his overall health decline.  He is currently taking Xtandi at 40 mg daily which is not the full dose at this time.  I recommend that he is back to 160 mg and monitoring his PSA after that.  Additional treatment options such as systemic chemotherapy pelvic radiation would be an option although his performance status remains marginal for such interventions.   2.  Bullous pemphigoid: Has been on methotrexate and follows with dermatology at [facility].  Improved at this time.   3.  ITP: His platelet count in February 2021 was close to his baseline without any need for treatment.  4.  Prognosis: He has an incurable malignancy with overall frail status at this time.  Overall prognosis is poor.   5. Follow-up: Will be in the next few months to follow his progress.   30  minutes were dedicated to this visit. The time was spent on reviewing laboratory data, imaging studies, discussing treatment options,  and answering questions regarding future plan.    Zola Button, MD 4/7/20212:52 PM

## 2020-02-19 ENCOUNTER — Telehealth: Payer: Self-pay | Admitting: Oncology

## 2020-02-19 ENCOUNTER — Other Ambulatory Visit (HOSPITAL_COMMUNITY): Payer: Self-pay | Admitting: Urology

## 2020-02-19 ENCOUNTER — Other Ambulatory Visit: Payer: Self-pay | Admitting: Urology

## 2020-02-19 DIAGNOSIS — S32509S Unspecified fracture of unspecified pubis, sequela: Secondary | ICD-10-CM | POA: Diagnosis not present

## 2020-02-19 DIAGNOSIS — C61 Malignant neoplasm of prostate: Secondary | ICD-10-CM

## 2020-02-19 DIAGNOSIS — R2681 Unsteadiness on feet: Secondary | ICD-10-CM | POA: Diagnosis not present

## 2020-02-19 DIAGNOSIS — M6281 Muscle weakness (generalized): Secondary | ICD-10-CM | POA: Diagnosis not present

## 2020-02-19 DIAGNOSIS — R269 Unspecified abnormalities of gait and mobility: Secondary | ICD-10-CM | POA: Diagnosis not present

## 2020-02-19 DIAGNOSIS — Z9181 History of falling: Secondary | ICD-10-CM | POA: Diagnosis not present

## 2020-02-19 DIAGNOSIS — C7951 Secondary malignant neoplasm of bone: Secondary | ICD-10-CM

## 2020-02-19 DIAGNOSIS — R1313 Dysphagia, pharyngeal phase: Secondary | ICD-10-CM | POA: Diagnosis not present

## 2020-02-19 NOTE — Telephone Encounter (Signed)
Scheduled appt per 4/7 los.  Left a vm of the appt date and time. 

## 2020-02-20 ENCOUNTER — Telehealth: Payer: Self-pay

## 2020-02-20 DIAGNOSIS — M6281 Muscle weakness (generalized): Secondary | ICD-10-CM | POA: Diagnosis not present

## 2020-02-20 DIAGNOSIS — S32509S Unspecified fracture of unspecified pubis, sequela: Secondary | ICD-10-CM | POA: Diagnosis not present

## 2020-02-20 DIAGNOSIS — R1313 Dysphagia, pharyngeal phase: Secondary | ICD-10-CM | POA: Diagnosis not present

## 2020-02-20 DIAGNOSIS — Z9181 History of falling: Secondary | ICD-10-CM | POA: Diagnosis not present

## 2020-02-20 DIAGNOSIS — R269 Unspecified abnormalities of gait and mobility: Secondary | ICD-10-CM | POA: Diagnosis not present

## 2020-02-20 DIAGNOSIS — R2681 Unsteadiness on feet: Secondary | ICD-10-CM | POA: Diagnosis not present

## 2020-02-20 NOTE — Telephone Encounter (Signed)
Patient daughter Irving Burton) called requesting for patient o have orders for a wheelchair. Approval for patient to have 2 oz of alcohol once a day sent to the [facility]. Please advise orders.

## 2020-02-21 NOTE — Telephone Encounter (Signed)
OK for both

## 2020-02-23 DIAGNOSIS — R2681 Unsteadiness on feet: Secondary | ICD-10-CM | POA: Diagnosis not present

## 2020-02-23 DIAGNOSIS — M6281 Muscle weakness (generalized): Secondary | ICD-10-CM | POA: Diagnosis not present

## 2020-02-23 DIAGNOSIS — Z9181 History of falling: Secondary | ICD-10-CM | POA: Diagnosis not present

## 2020-02-23 DIAGNOSIS — R1313 Dysphagia, pharyngeal phase: Secondary | ICD-10-CM | POA: Diagnosis not present

## 2020-02-23 DIAGNOSIS — R269 Unspecified abnormalities of gait and mobility: Secondary | ICD-10-CM | POA: Diagnosis not present

## 2020-02-23 DIAGNOSIS — S32509S Unspecified fracture of unspecified pubis, sequela: Secondary | ICD-10-CM | POA: Diagnosis not present

## 2020-02-23 NOTE — Telephone Encounter (Signed)
Called [facility] [phone] to advise of approvel of 2 oz of alcohol daily. Also to advise of order already being faxed over to [facility]. Will also call daughter to advise. [place]

## 2020-02-24 ENCOUNTER — Telehealth: Payer: Self-pay

## 2020-02-24 DIAGNOSIS — R269 Unspecified abnormalities of gait and mobility: Secondary | ICD-10-CM | POA: Diagnosis not present

## 2020-02-24 DIAGNOSIS — M6281 Muscle weakness (generalized): Secondary | ICD-10-CM | POA: Diagnosis not present

## 2020-02-24 DIAGNOSIS — R2681 Unsteadiness on feet: Secondary | ICD-10-CM | POA: Diagnosis not present

## 2020-02-24 DIAGNOSIS — R1313 Dysphagia, pharyngeal phase: Secondary | ICD-10-CM | POA: Diagnosis not present

## 2020-02-24 DIAGNOSIS — S32509S Unspecified fracture of unspecified pubis, sequela: Secondary | ICD-10-CM | POA: Diagnosis not present

## 2020-02-24 DIAGNOSIS — Z9181 History of falling: Secondary | ICD-10-CM | POA: Diagnosis not present

## 2020-02-24 NOTE — Telephone Encounter (Signed)
Pt. Wife called stating that she brings Corey Oneill beer and bourbon to the assisted living place he is at. They said he would have to have a note from his doctor stating that this is ok, on a prescription pad.

## 2020-02-24 NOTE — Telephone Encounter (Signed)
A note is on my desk

## 2020-02-25 ENCOUNTER — Other Ambulatory Visit: Payer: Self-pay | Admitting: Cardiovascular Disease

## 2020-02-25 DIAGNOSIS — S32509S Unspecified fracture of unspecified pubis, sequela: Secondary | ICD-10-CM | POA: Diagnosis not present

## 2020-02-25 DIAGNOSIS — R269 Unspecified abnormalities of gait and mobility: Secondary | ICD-10-CM | POA: Diagnosis not present

## 2020-02-25 DIAGNOSIS — Z9181 History of falling: Secondary | ICD-10-CM | POA: Diagnosis not present

## 2020-02-25 DIAGNOSIS — R1313 Dysphagia, pharyngeal phase: Secondary | ICD-10-CM | POA: Diagnosis not present

## 2020-02-25 DIAGNOSIS — M6281 Muscle weakness (generalized): Secondary | ICD-10-CM | POA: Diagnosis not present

## 2020-02-25 DIAGNOSIS — R2681 Unsteadiness on feet: Secondary | ICD-10-CM | POA: Diagnosis not present

## 2020-02-25 NOTE — Telephone Encounter (Signed)
Done KH 

## 2020-02-26 DIAGNOSIS — R269 Unspecified abnormalities of gait and mobility: Secondary | ICD-10-CM | POA: Diagnosis not present

## 2020-02-26 DIAGNOSIS — R1313 Dysphagia, pharyngeal phase: Secondary | ICD-10-CM | POA: Diagnosis not present

## 2020-02-26 DIAGNOSIS — R2681 Unsteadiness on feet: Secondary | ICD-10-CM | POA: Diagnosis not present

## 2020-02-26 DIAGNOSIS — Z9181 History of falling: Secondary | ICD-10-CM | POA: Diagnosis not present

## 2020-02-26 DIAGNOSIS — M6281 Muscle weakness (generalized): Secondary | ICD-10-CM | POA: Diagnosis not present

## 2020-02-26 DIAGNOSIS — S32509S Unspecified fracture of unspecified pubis, sequela: Secondary | ICD-10-CM | POA: Diagnosis not present

## 2020-02-27 DIAGNOSIS — R269 Unspecified abnormalities of gait and mobility: Secondary | ICD-10-CM | POA: Diagnosis not present

## 2020-02-27 DIAGNOSIS — R1313 Dysphagia, pharyngeal phase: Secondary | ICD-10-CM | POA: Diagnosis not present

## 2020-02-27 DIAGNOSIS — M6281 Muscle weakness (generalized): Secondary | ICD-10-CM | POA: Diagnosis not present

## 2020-02-27 DIAGNOSIS — Z9181 History of falling: Secondary | ICD-10-CM | POA: Diagnosis not present

## 2020-02-27 DIAGNOSIS — R2681 Unsteadiness on feet: Secondary | ICD-10-CM | POA: Diagnosis not present

## 2020-02-27 DIAGNOSIS — S32509S Unspecified fracture of unspecified pubis, sequela: Secondary | ICD-10-CM | POA: Diagnosis not present

## 2020-03-01 ENCOUNTER — Telehealth: Payer: Self-pay | Admitting: Family Medicine

## 2020-03-01 DIAGNOSIS — S32509S Unspecified fracture of unspecified pubis, sequela: Secondary | ICD-10-CM | POA: Diagnosis not present

## 2020-03-01 DIAGNOSIS — Z9181 History of falling: Secondary | ICD-10-CM | POA: Diagnosis not present

## 2020-03-01 DIAGNOSIS — R1313 Dysphagia, pharyngeal phase: Secondary | ICD-10-CM | POA: Diagnosis not present

## 2020-03-01 DIAGNOSIS — R269 Unspecified abnormalities of gait and mobility: Secondary | ICD-10-CM | POA: Diagnosis not present

## 2020-03-01 DIAGNOSIS — R2681 Unsteadiness on feet: Secondary | ICD-10-CM | POA: Diagnosis not present

## 2020-03-01 DIAGNOSIS — M6281 Muscle weakness (generalized): Secondary | ICD-10-CM | POA: Diagnosis not present

## 2020-03-01 NOTE — Telephone Encounter (Signed)
Pt states that she would rather have a permanent parking placard instead of 6 month. She would prefer not to have to renew every 6 months, sending back another form in folder. Please advise wife at [phone].

## 2020-03-01 NOTE — Telephone Encounter (Signed)
Explain that permanent would not be appropriate under the circumstances.

## 2020-03-01 NOTE — Telephone Encounter (Signed)
Pt's wife called and she received the completed parking placard thru the mail.

## 2020-03-02 DIAGNOSIS — R269 Unspecified abnormalities of gait and mobility: Secondary | ICD-10-CM | POA: Diagnosis not present

## 2020-03-02 DIAGNOSIS — M6281 Muscle weakness (generalized): Secondary | ICD-10-CM | POA: Diagnosis not present

## 2020-03-02 DIAGNOSIS — R2681 Unsteadiness on feet: Secondary | ICD-10-CM | POA: Diagnosis not present

## 2020-03-02 DIAGNOSIS — R1313 Dysphagia, pharyngeal phase: Secondary | ICD-10-CM | POA: Diagnosis not present

## 2020-03-02 DIAGNOSIS — Z9181 History of falling: Secondary | ICD-10-CM | POA: Diagnosis not present

## 2020-03-02 DIAGNOSIS — S32509S Unspecified fracture of unspecified pubis, sequela: Secondary | ICD-10-CM | POA: Diagnosis not present

## 2020-03-02 NOTE — Telephone Encounter (Signed)
LVM advising info concerning the permanent place card. Advised to call back if she had questions. [place]

## 2020-03-03 DIAGNOSIS — S32509S Unspecified fracture of unspecified pubis, sequela: Secondary | ICD-10-CM | POA: Diagnosis not present

## 2020-03-03 DIAGNOSIS — Z9181 History of falling: Secondary | ICD-10-CM | POA: Diagnosis not present

## 2020-03-03 DIAGNOSIS — R2681 Unsteadiness on feet: Secondary | ICD-10-CM | POA: Diagnosis not present

## 2020-03-03 DIAGNOSIS — R269 Unspecified abnormalities of gait and mobility: Secondary | ICD-10-CM | POA: Diagnosis not present

## 2020-03-03 DIAGNOSIS — R1313 Dysphagia, pharyngeal phase: Secondary | ICD-10-CM | POA: Diagnosis not present

## 2020-03-03 DIAGNOSIS — M6281 Muscle weakness (generalized): Secondary | ICD-10-CM | POA: Diagnosis not present

## 2020-03-04 ENCOUNTER — Telehealth: Payer: Self-pay

## 2020-03-04 DIAGNOSIS — R2681 Unsteadiness on feet: Secondary | ICD-10-CM | POA: Diagnosis not present

## 2020-03-04 DIAGNOSIS — R1313 Dysphagia, pharyngeal phase: Secondary | ICD-10-CM | POA: Diagnosis not present

## 2020-03-04 DIAGNOSIS — M6281 Muscle weakness (generalized): Secondary | ICD-10-CM | POA: Diagnosis not present

## 2020-03-04 DIAGNOSIS — R269 Unspecified abnormalities of gait and mobility: Secondary | ICD-10-CM | POA: Diagnosis not present

## 2020-03-04 DIAGNOSIS — Z9181 History of falling: Secondary | ICD-10-CM | POA: Diagnosis not present

## 2020-03-04 DIAGNOSIS — S32509S Unspecified fracture of unspecified pubis, sequela: Secondary | ICD-10-CM | POA: Diagnosis not present

## 2020-03-04 NOTE — Telephone Encounter (Signed)
LVM for pt wife to call back and advise if she received the message. [place]

## 2020-03-04 NOTE — Telephone Encounter (Signed)
I need help with the barrier cream, Need a name

## 2020-03-04 NOTE — Telephone Encounter (Signed)
[place] called from the [facility] to advise of Corey Oneill not active and has a cough. Per Dr. Redmond School gave the verbal for U/A and mobile chest x-ray. Pt also need barrier cream for bottom . Please send in to local pharmacy and I am going to call daughter to go by and pick it up. [place]

## 2020-03-04 NOTE — Telephone Encounter (Signed)
Butt paste, or Desitin cream. Thanks [place]

## 2020-03-05 ENCOUNTER — Inpatient Hospital Stay (HOSPITAL_COMMUNITY)
Admission: EM | Admit: 2020-03-05 | Discharge: 2020-03-08 | DRG: 951 | Disposition: A | Discharge status: Hospice | Payer: Medicare Other | Source: Skilled Nursing Facility | Attending: Internal Medicine | Admitting: Internal Medicine

## 2020-03-05 ENCOUNTER — Emergency Department (HOSPITAL_COMMUNITY): Payer: Medicare Other

## 2020-03-05 DIAGNOSIS — Z7982 Long term (current) use of aspirin: Secondary | ICD-10-CM

## 2020-03-05 DIAGNOSIS — Z66 Do not resuscitate: Secondary | ICD-10-CM | POA: Diagnosis not present

## 2020-03-05 DIAGNOSIS — M6281 Muscle weakness (generalized): Secondary | ICD-10-CM | POA: Diagnosis present

## 2020-03-05 DIAGNOSIS — Z8546 Personal history of malignant neoplasm of prostate: Secondary | ICD-10-CM

## 2020-03-05 DIAGNOSIS — R569 Unspecified convulsions: Secondary | ICD-10-CM | POA: Diagnosis not present

## 2020-03-05 DIAGNOSIS — Z515 Encounter for palliative care: Secondary | ICD-10-CM | POA: Diagnosis not present

## 2020-03-05 DIAGNOSIS — C799 Secondary malignant neoplasm of unspecified site: Secondary | ICD-10-CM

## 2020-03-05 DIAGNOSIS — R627 Adult failure to thrive: Secondary | ICD-10-CM

## 2020-03-05 DIAGNOSIS — I2510 Atherosclerotic heart disease of native coronary artery without angina pectoris: Secondary | ICD-10-CM | POA: Diagnosis present

## 2020-03-05 DIAGNOSIS — E860 Dehydration: Secondary | ICD-10-CM | POA: Diagnosis present

## 2020-03-05 DIAGNOSIS — Z20822 Contact with and (suspected) exposure to covid-19: Secondary | ICD-10-CM | POA: Diagnosis present

## 2020-03-05 DIAGNOSIS — R1310 Dysphagia, unspecified: Secondary | ICD-10-CM | POA: Diagnosis present

## 2020-03-05 DIAGNOSIS — Z955 Presence of coronary angioplasty implant and graft: Secondary | ICD-10-CM

## 2020-03-05 DIAGNOSIS — R296 Repeated falls: Secondary | ICD-10-CM | POA: Diagnosis present

## 2020-03-05 DIAGNOSIS — Z808 Family history of malignant neoplasm of other organs or systems: Secondary | ICD-10-CM

## 2020-03-05 DIAGNOSIS — I5032 Chronic diastolic (congestive) heart failure: Secondary | ICD-10-CM | POA: Diagnosis present

## 2020-03-05 DIAGNOSIS — E43 Unspecified severe protein-calorie malnutrition: Secondary | ICD-10-CM | POA: Diagnosis not present

## 2020-03-05 DIAGNOSIS — L8990 Pressure ulcer of unspecified site, unspecified stage: Secondary | ICD-10-CM | POA: Diagnosis present

## 2020-03-05 DIAGNOSIS — R269 Unspecified abnormalities of gait and mobility: Secondary | ICD-10-CM | POA: Diagnosis not present

## 2020-03-05 DIAGNOSIS — S32509S Unspecified fracture of unspecified pubis, sequela: Secondary | ICD-10-CM | POA: Diagnosis not present

## 2020-03-05 DIAGNOSIS — R2681 Unsteadiness on feet: Secondary | ICD-10-CM | POA: Diagnosis not present

## 2020-03-05 DIAGNOSIS — Z87891 Personal history of nicotine dependence: Secondary | ICD-10-CM

## 2020-03-05 DIAGNOSIS — I350 Nonrheumatic aortic (valve) stenosis: Secondary | ICD-10-CM | POA: Diagnosis present

## 2020-03-05 DIAGNOSIS — Z681 Body mass index (BMI) 19 or less, adult: Secondary | ICD-10-CM

## 2020-03-05 DIAGNOSIS — L89151 Pressure ulcer of sacral region, stage 1: Secondary | ICD-10-CM

## 2020-03-05 DIAGNOSIS — R000 Tachycardia, unspecified: Secondary | ICD-10-CM | POA: Diagnosis not present

## 2020-03-05 DIAGNOSIS — R1313 Dysphagia, pharyngeal phase: Secondary | ICD-10-CM | POA: Diagnosis not present

## 2020-03-05 DIAGNOSIS — Z952 Presence of prosthetic heart valve: Secondary | ICD-10-CM

## 2020-03-05 DIAGNOSIS — R531 Weakness: Secondary | ICD-10-CM | POA: Diagnosis not present

## 2020-03-05 DIAGNOSIS — I110 Hypertensive heart disease with heart failure: Secondary | ICD-10-CM | POA: Diagnosis present

## 2020-03-05 DIAGNOSIS — Z862 Personal history of diseases of the blood and blood-forming organs and certain disorders involving the immune mechanism: Secondary | ICD-10-CM

## 2020-03-05 DIAGNOSIS — C61 Malignant neoplasm of prostate: Secondary | ICD-10-CM

## 2020-03-05 DIAGNOSIS — E871 Hypo-osmolality and hyponatremia: Secondary | ICD-10-CM | POA: Diagnosis not present

## 2020-03-05 DIAGNOSIS — L120 Bullous pemphigoid: Secondary | ICD-10-CM

## 2020-03-05 DIAGNOSIS — C7951 Secondary malignant neoplasm of bone: Secondary | ICD-10-CM | POA: Diagnosis not present

## 2020-03-05 DIAGNOSIS — M549 Dorsalgia, unspecified: Secondary | ICD-10-CM | POA: Diagnosis present

## 2020-03-05 DIAGNOSIS — Z792 Long term (current) use of antibiotics: Secondary | ICD-10-CM

## 2020-03-05 DIAGNOSIS — Z9181 History of falling: Secondary | ICD-10-CM | POA: Diagnosis not present

## 2020-03-05 DIAGNOSIS — R05 Cough: Secondary | ICD-10-CM | POA: Diagnosis not present

## 2020-03-05 DIAGNOSIS — G8929 Other chronic pain: Secondary | ICD-10-CM | POA: Diagnosis present

## 2020-03-05 DIAGNOSIS — E872 Acidosis: Secondary | ICD-10-CM | POA: Diagnosis present

## 2020-03-05 DIAGNOSIS — Z7189 Other specified counseling: Secondary | ICD-10-CM

## 2020-03-05 DIAGNOSIS — F329 Major depressive disorder, single episode, unspecified: Secondary | ICD-10-CM | POA: Diagnosis present

## 2020-03-05 DIAGNOSIS — Z86006 Personal history of melanoma in-situ: Secondary | ICD-10-CM

## 2020-03-05 DIAGNOSIS — Z743 Need for continuous supervision: Secondary | ICD-10-CM | POA: Diagnosis not present

## 2020-03-05 DIAGNOSIS — E785 Hyperlipidemia, unspecified: Secondary | ICD-10-CM | POA: Diagnosis present

## 2020-03-05 DIAGNOSIS — Z79899 Other long term (current) drug therapy: Secondary | ICD-10-CM

## 2020-03-05 DIAGNOSIS — Z923 Personal history of irradiation: Secondary | ICD-10-CM

## 2020-03-05 DIAGNOSIS — R9431 Abnormal electrocardiogram [ECG] [EKG]: Secondary | ICD-10-CM

## 2020-03-05 DIAGNOSIS — Z807 Family history of other malignant neoplasms of lymphoid, hematopoietic and related tissues: Secondary | ICD-10-CM

## 2020-03-05 DIAGNOSIS — D693 Immune thrombocytopenic purpura: Secondary | ICD-10-CM | POA: Diagnosis present

## 2020-03-05 DIAGNOSIS — K5900 Constipation, unspecified: Secondary | ICD-10-CM | POA: Diagnosis present

## 2020-03-05 LAB — CBC WITH DIFFERENTIAL/PLATELET
Abs Immature Granulocytes: 0.04 10*3/uL (ref 0.00–0.07)
Basophils Absolute: 0 10*3/uL (ref 0.0–0.1)
Basophils Relative: 0 %
Eosinophils Absolute: 0 10*3/uL (ref 0.0–0.5)
Eosinophils Relative: 0 %
HCT: 35.2 % — ABNORMAL LOW (ref 39.0–52.0)
Hemoglobin: 11.8 g/dL — ABNORMAL LOW (ref 13.0–17.0)
Immature Granulocytes: 1 %
Lymphocytes Relative: 12 %
Lymphs Abs: 0.8 10*3/uL (ref 0.7–4.0)
MCH: 33.4 pg (ref 26.0–34.0)
MCHC: 33.5 g/dL (ref 30.0–36.0)
MCV: 99.7 fL (ref 80.0–100.0)
Monocytes Absolute: 0.5 10*3/uL (ref 0.1–1.0)
Monocytes Relative: 8 %
Neutro Abs: 5.4 10*3/uL (ref 1.7–7.7)
Neutrophils Relative %: 79 %
Platelets: 97 10*3/uL — ABNORMAL LOW (ref 150–400)
RBC: 3.53 MIL/uL — ABNORMAL LOW (ref 4.22–5.81)
RDW: 14.2 % (ref 11.5–15.5)
WBC: 6.7 10*3/uL (ref 4.0–10.5)
nRBC: 0 % (ref 0.0–0.2)

## 2020-03-05 LAB — COMPREHENSIVE METABOLIC PANEL
ALT: 6 U/L (ref 0–44)
AST: 17 U/L (ref 15–41)
Albumin: 3.2 g/dL — ABNORMAL LOW (ref 3.5–5.0)
Alkaline Phosphatase: 101 U/L (ref 38–126)
Anion gap: 16 — ABNORMAL HIGH (ref 5–15)
BUN: 12 mg/dL (ref 8–23)
CO2: 22 mmol/L (ref 22–32)
Calcium: 8.9 mg/dL (ref 8.9–10.3)
Chloride: 92 mmol/L — ABNORMAL LOW (ref 98–111)
Creatinine, Ser: 0.91 mg/dL (ref 0.61–1.24)
GFR calc Af Amer: >60 mL/min (ref >60)
GFR calc non Af Amer: >60 mL/min (ref >60)
Glucose, Bld: 103 mg/dL — ABNORMAL HIGH (ref 70–99)
Potassium: 3.6 mmol/L (ref 3.5–5.1)
Sodium: 130 mmol/L — ABNORMAL LOW (ref 135–145)
Total Bilirubin: 1.1 mg/dL (ref 0.3–1.2)
Total Protein: 7.2 g/dL (ref 6.5–8.1)

## 2020-03-05 LAB — MAGNESIUM: Magnesium: 1.9 mg/dL (ref 1.7–2.4)

## 2020-03-05 MED ORDER — ENOXAPARIN SODIUM 40 MG/0.4ML ~~LOC~~ SOLN
40.0000 mg | SUBCUTANEOUS | Status: DC
  Administered 2020-03-05: 23:00:00 40 mg via SUBCUTANEOUS
  Filled 2020-03-05: qty 0.4

## 2020-03-05 MED ORDER — LACTATED RINGERS IV BOLUS
1000.0000 mL | Freq: Once | INTRAVENOUS | Status: AC
  Administered 2020-03-05: 19:00:00 1000 mL via INTRAVENOUS

## 2020-03-05 MED ORDER — DESITIN 13 % EX CREA: 1.0000 "application " | TOPICAL_CREAM | Freq: Two times a day (BID) | CUTANEOUS | 1 refills | Status: DC

## 2020-03-05 MED ORDER — LACTATED RINGERS IV SOLN
INTRAVENOUS | Status: DC
  Administered 2020-03-05: 75 mL/h via INTRAVENOUS

## 2020-03-05 NOTE — H&P (Signed)
History and Physical    Corey Oneill [id number] DOB: March 05, 1936 DOA: 03/05/2020  PCP: [name], MD  Patient coming from: SNF  I have personally briefly reviewed patient's old medical records in [place]  Chief Complaint: generalized weakness and decrease PO intake  HPI: Corey Oneill is a 84 y.o. 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   Patient overall a poor historian and no family could be reached by phone.Patient reports that he has been feeling weak and had several falls while in nursing facility. He is still able to ambulate with a walker. Feels pain to his lower spine but due to sitting on it too long. Has had low appetite. Feels he has decrease urine output today. Also feels dizzy today with standing. No nausea, vomiting or diarrhea. Last bowel movement was this morning. No fever.  Reportedly presents today due to 3 to 4 days history of generalized weakness, decreased activity noncompliant with medication and poor intake.  Patient was recently admitted in February for aspiration pneumonia and was discharged to Skilled nursing facility. Has not yet returned home.    Temp of 98, mildly tachycardic and normotensive WBC of 6.7, hemoglobin of 11.8 from baseline of around 9-10 and appears heme-concentrated. Platelet stable at 97. Na of 130, glucose of 103, anion gap of 16.  CXR negative.   Review of Systems:  Constitutional: No Weight Change, No Fever ENT/Mouth: No sore throat, No Rhinorrhea Eyes: No Eye Pain, No Vision Changes Cardiovascular: No Chest Pain, no SOB Respiratory: No Cough, No Sputum Gastrointestinal: No Nausea, No Vomiting, No Diarrhea, No Constipation, No Pain  Genitourinary:+ Urinary Incontinence Musculoskeletal: No Arthralgias, No Myalgias Skin: No Skin Lesions, No Pruritus, Neuro: + Weakness, No Numbness,  No Loss of Consciousness, No Syncope Psych: No Anxiety/Panic, No Depression, + decrease appetite Heme/Lymph: No Bruising, No Bleeding  Past Medical History:  Diagnosis Date  . Abdominal aortic atherosclerosis ([place])   . Anginal pain ([place])    last noted 08/17/19  . Bone cancer ([place])    Left hip  . BPPV (benign paroxysmal positional vertigo)   . Bullous pemphigoid   . CAD (coronary artery disease)    a.  LHC 8/16: Mid to distal LAD 30%, OM1 40%, proximal mid RCA 40%, distal RCA 60% >> FFR 0.69  >> PCI: 3 x 15 mm Resolute DES  . Depression   . Esophageal reflux   . Grade I diastolic dysfunction 123456   Noted on ECHO   . H/O blood clots    "get them in my stool and urine" (11/12/2015)  . Heart murmur   . Hepatic lesion 12/08/2015   Stable 8 mm right hepatic lesion  . History of aortic stenosis    a. peak to peak gradient by LHC 8/16:  37 mmHg (moderately severe)   . History of appendicitis 2016  . History of blood transfusion 12/2018  . History of herpes labialis   . History of ITP    2018, thrombocytopenia  . History of kidney stones   . Hx of radiation therapy 04/14/13-06/09/13   prostate 7800 cGy, 40 sessions, seminal vesicles 5600 cGy 40 sessions  . Hydrocele 2006   Small  . Hyperlipidemia   . Insomnia  resolved  . LVH (left ventricular hypertrophy)    Moderate, noted on ECHO 09/2018  . Malignant melanoma in situ ([place]) 09/04/2016   Right neck and chest  . Metastasis from malignant neoplasm of prostate ([place]) 02/25/2019  . Prostate cancer ([place]) dx'd 2014  . Radiation proctitis   . Renal mass 2017   Bilateral renal masses  . S/P skin biopsy 04/09/2019   subepidermal cell poor vesicle , Linear IGG and C3 the basement membrane  . Suprapubic catheter ([place])   . Wears hearing aid in both ears     Past Surgical  History:  Procedure Laterality Date  . CARDIAC CATHETERIZATION N/A 04/21/2015   Procedure: Right/Left Heart Cath and Coronary Angiography;  Surgeon: [name], MD;  Location: [facility];  Service: Cardiovascular;  Laterality: N/A;  . CARDIAC CATHETERIZATION N/A 06/18/2015   Procedure: Intravascular Pressure Wire/FFR Study;  Surgeon: [name], MD;  Location: [facility];  Service: Cardiovascular;  Laterality: N/A;  . CARDIAC CATHETERIZATION N/A 06/18/2015   Procedure: Coronary Stent Intervention;  Surgeon: [name], MD;  Location: [facility];  Service: Cardiovascular;  Laterality: N/A;  . CARDIAC CATHETERIZATION N/A 06/18/2015   Procedure: Right/Left Heart Cath and Coronary Angiography;  Surgeon: [name], MD;  Location: [facility];  Service: Cardiovascular;  Laterality: N/A;  . CARDIAC CATHETERIZATION N/A 06/23/2015   Procedure: Left Heart Cath and Cors/Grafts Angiography;  Surgeon: [name], MD;  Location: [facility];  Service: Cardiovascular;  Laterality: N/A;  . CARDIAC CATHETERIZATION N/A 01/17/2016   Procedure: Right/Left Heart Cath and Coronary Angiography;  Surgeon: [name], MD;  Location: [facility];  Service: Cardiovascular;  Laterality: N/A;  . CATARACT EXTRACTION, BILATERAL    . COLONOSCOPY  06/26/2017  . CYSTOSCOPY WITH BIOPSY N/A 07/30/2018   Procedure: CYSTOSCOPY WITH BIOPSY/FULGURATION, CYSTOLTHOLAPAXY;  Surgeon: [name], MD;  Location: [facility];  Service: Urology;  Laterality: N/A;  . CYSTOSCOPY WITH URETHRAL DILATATION N/A 08/18/2019   Procedure: CYSTOSCOPY WITH URETHRAL DILATATION USING BALLOON OR LASER/ SPURO PUBIC TUBE CHANGE LASER EXCISION OF URETHRAL STRICTURE RETROGRADE URETHROGRAM ANTEGRADE CYSTOGRAM;  Surgeon: [name], MD;  Location: [facility];  Service: Urology;  Laterality: N/A;  . FRACTURE SURGERY    . INGUINAL HERNIA REPAIR     patient does not remember this  procedure  . LAPAROSCOPIC APPENDECTOMY N/A 11/16/2015   Procedure: APPENDECTOMY LAPAROSCOPIC;  Surgeon: [name], MD;  Location: [facility];  Service: General;  Laterality: N/A;  . LEFT HEART CATH AND CORONARY ANGIOGRAPHY N/A 12/26/2016   Procedure: Left Heart Cath and Coronary Angiography;  Surgeon: Troy Sine, MD;  Location: [place];  Service: Cardiovascular;  Laterality: N/A;  . MELANOMA EXCISION Right 10/24/2016   Procedure: WIDE EXCISION MELANOMA RIGHT NECK AND RIGHT CHEST TIMES 2;  Surgeon: [name], MD;  Location: [facility];  Service: General;  Laterality: Right;  right medial and lateral lesion of chest and right neck  . NASAL FRACTURE SURGERY     "broken years ago; several ORs to correct it"  . NASAL SEPTUM SURGERY    . PROSTATE BIOPSY  2014   "needle biopsy"  . TEE WITHOUT CARDIOVERSION N/A 02/15/2016   Procedure: TRANSESOPHAGEAL ECHOCARDIOGRAM (TEE);  Surgeon: [name], MD;  Location: [place];  Service: Open Heart Surgery;  Laterality: N/A;  . TEE WITHOUT CARDIOVERSION N/A 08/26/2019   Procedure: TRANSESOPHAGEAL ECHOCARDIOGRAM (TEE);  Surgeon: Debara Pickett,  Nadean Corwin, MD;  Location: [facility];  Service: Cardiovascular;  Laterality: N/A;  . TONSILLECTOMY    . TRANSCATHETER AORTIC VALVE REPLACEMENT, TRANSFEMORAL  02/15/2016  . TRANSCATHETER AORTIC VALVE REPLACEMENT, TRANSFEMORAL N/A 02/15/2016   Procedure: TRANSCATHETER AORTIC VALVE REPLACEMENT, TRANSFEMORAL;  Surgeon: [name], MD;  Location: [place];  Service: Open Heart Surgery;  Laterality: N/A;  . TRANSURETHRAL RESECTION OF PROSTATE  12/23/2018   Procedure: CYSTOSCOPY WITH  BLADDER BIOPSY WITH FULGERATION/ TRANSURETHRAL RESECTION PROSTATE  AND PROSTATE BIOPSY;  Surgeon: [name], MD;  Location: [facility];  Service: Urology;;     reports that he has quit smoking. His smoking use included cigarettes. He smoked 0.00 packs per day. He has never used smokeless tobacco. He reports current  alcohol use. He reports that he does not use drugs.  Allergies  Allergen Reactions  . Prednisone Other (See Comments)    Makes the patient depressed    Family History  Problem Relation Age of Onset  . Brain cancer Father   . Lymphoma Mother   . Depression Daughter      Prior to Admission medications   Medication Sig Start Date End Date Taking? Authorizing Provider  acetaminophen (TYLENOL) 500 MG tablet Take 500 mg by mouth every 6 (six) hours as needed for mild pain or moderate pain.     [provider]  amoxicillin (AMOXIL) 500 MG tablet Take 1 tablet (500 mg total) by mouth 2 (two) times daily. 11/07/19   Thayer Headings, MD  aspirin EC 81 MG tablet Take 81 mg by mouth every evening.     [provider]  Calcium-Phosphorus-Vitamin D (CITRACAL +D3 PO) Take 1 tablet by mouth at bedtime.    [provider]  carvedilol (COREG) 3.125 MG tablet TAKE 1 TABLET BY MOUTH TWICE DAILY. 02/26/20   [name], MD  cyanocobalamin 1000 MCG tablet Take 1,000 mcg by mouth daily.    [provider]  doxycycline (MONODOX) 100 MG capsule Take 100 mg by mouth 2 (two) times daily.  11/18/19   [provider]  DULoxetine (CYMBALTA) 60 MG capsule Take 1 capsule (60 mg total) by mouth daily. 12/16/19   [name], MD  enzalutamide Gillermina Phy) 40 MG capsule Take 160 mg by mouth daily with supper.     [provider]  ferrous sulfate (FEROSUL) 325 (65 FE) MG tablet Take 325 mg by mouth daily with breakfast.    [provider]  folic acid (FOLVITE) 1 MG tablet Take 1 mg by mouth See admin instructions. Take 1 mg by mouth once a day on Sun/Mon/Wed/Thurs/Fri/Sat (not on Tuesdays)    [provider]  ibuprofen (ADVIL) 200 MG tablet Take 200-400 mg by mouth every 6 (six) hours as needed (for pain or inflammation).    [provider]  isosorbide mononitrate (IMDUR) 30 MG 24 hr tablet TAKE 1 TABLET ONCE DAILY. 02/11/20   [name],  MD  lamoTRIgine (LAMICTAL) 25 MG tablet One tablet twice a day for 2 weeks, then take 2 tablets twice a day for 2 weeks, then take 3 tablets twice a day 12/01/19   Kathrynn Ducking, MD  MAGNESIUM PO Take 1 tablet by mouth daily.     [provider]  methotrexate (RHEUMATREX) 2.5 MG tablet Take 10 mg by mouth every Tuesday.     [provider]  MULTIPLE VITAMIN PO Take 1 tablet by mouth daily.    [provider]  nitroGLYCERIN (NITROSTAT) 0.4 MG SL tablet  1 TAB UNDER TONGUE AS NEEDED FOR CHEST PAIN. MAY REPEAT EVERY 5 MIN FOR A TOTAL OF 3 DOSES. 07/07/19   [name], MD  polyethylene glycol (MIRALAX / GLYCOLAX) 17 g packet Take 17 g by mouth daily as needed for mild constipation. 12/24/19   Shelly Coss, MD  rosuvastatin (CRESTOR) 10 MG tablet TAKE 1 TABLET ONCE DAILY. 05/26/19   Tysinger, Camelia Eng, PA-C  traMADol (ULTRAM) 50 MG tablet Take 1 tablet (50 mg total) by mouth every 6 (six) hours as needed (for pain). 12/24/19   Shelly Coss, MD  Zinc Oxide (DESITIN) 13 % CREA Apply 1 application topically 2 (two) times daily. 03/05/20   [name], MD    Physical Exam: Vitals:   03/05/20 1710 03/05/20 1711 03/05/20 1830 03/05/20 1930  BP:    (!) 143/81  Pulse:    90  Resp:    16  Temp:  98 F (36.7 C)    TempSrc:  Oral    SpO2: 98%   100%  Weight:   52.6 kg   Height:   5\' 10"  (1.778 m)     Constitutional: NAD, calm, comfortable, thin cachectic elderly male laying flat in bed Vitals:   03/05/20 1710 03/05/20 1711 03/05/20 1830 03/05/20 1930  BP:    (!) 143/81  Pulse:    90  Resp:    16  Temp:  98 F (36.7 C)    TempSrc:  Oral    SpO2: 98%   100%  Weight:   52.6 kg   Height:   5\' 10"  (1.778 m)    Eyes: PERRL, lids and conjunctivae normal ENMT: Mucous membranes are moist. Wears hearing aids. Neck: normal, supple Respiratory: clear to auscultation bilaterally, no wheezing, no crackles. Normal respiratory effort on room air Cardiovascular:  Regular rate and rhythm, no murmurs / rubs / gallops. No extremity edema. 2+ pedal pulses.  Abdomen: no tenderness, no masses palpated. No hepatosplenomegaly. Bowel sounds positive.  Back: No overlying skin lesions, no obvious deformities, no pain with palpation.  There is a stage I sacral ulcer wound. Musculoskeletal: no clubbing / cyanosis. No joint deformity upper and lower extremities.  Contracture bilateral lower extremity. Skin: no rashes, lesions, ulcers. No induration Neurologic: CN 2-12 grossly intact. Sensation intact,Strength 4/5 in lower extremity. Psychiatric: Normal judgment and insight. Alert and oriented x 3. Normal mood.     Labs on Admission: I have personally reviewed following labs and imaging studies  CBC: Recent Labs  Lab 03/05/20 1829  WBC 6.7  NEUTROABS 5.4  HGB 11.8*  HCT 35.2*  MCV 99.7  PLT 97*   Basic Metabolic Panel: Recent Labs  Lab 03/05/20 1829  NA 130*  K 3.6  CL 92*  CO2 22  GLUCOSE 103*  BUN 12  CREATININE 0.91  CALCIUM 8.9  MG 1.9   GFR: Estimated Creatinine Clearance: 46.6 mL/min (by C-G formula based on SCr of 0.91 mg/dL). Liver Function Tests: Recent Labs  Lab 03/05/20 1829  AST 17  ALT 6  ALKPHOS 101  BILITOT 1.1  PROT 7.2  ALBUMIN 3.2*   No results for input(s): LIPASE, AMYLASE in the last 168 hours. No results for input(s): AMMONIA in the last 168 hours. Coagulation Profile: No results for input(s): INR, PROTIME in the last 168 hours. Cardiac Enzymes: No results for input(s): CKTOTAL, CKMB, CKMBINDEX, TROPONINI in the last 168 hours. BNP (last 3 results) No results for input(s): PROBNP in the last 8760 hours. HbA1C: No results for  input(s): HGBA1C in the last 72 hours. CBG: No results for input(s): GLUCAP in the last 168 hours. Lipid Profile: No results for input(s): CHOL, HDL, LDLCALC, TRIG, CHOLHDL, LDLDIRECT in the last 72 hours. Thyroid Function Tests: No results for input(s): TSH, T4TOTAL, FREET4, T3FREE,  THYROIDAB in the last 72 hours. Anemia Panel: No results for input(s): VITAMINB12, FOLATE, FERRITIN, TIBC, IRON, RETICCTPCT in the last 72 hours. Urine analysis:    Component Value Date/Time   COLORURINE STRAW (A) 12/20/2019 0136   APPEARANCEUR CLEAR 12/20/2019 0136   LABSPEC 1.005 12/20/2019 0136   PHURINE 6.0 12/20/2019 0136   GLUCOSEU NEGATIVE 12/20/2019 0136   HGBUR NEGATIVE 12/20/2019 0136   BILIRUBINUR NEGATIVE 12/20/2019 0136   BILIRUBINUR n 10/15/2015 0939   KETONESUR 20 (A) 12/20/2019 0136   PROTEINUR NEGATIVE 12/20/2019 0136   UROBILINOGEN 0.2 10/15/2015 0939   UROBILINOGEN 0.2 06/23/2015 1443   NITRITE NEGATIVE 12/20/2019 0136   LEUKOCYTESUR NEGATIVE 12/20/2019 0136    Radiological Exams on Admission: DG Chest Portable 1 View  Result Date: 03/05/2020 CLINICAL DATA:  Failure to thrive. No appetite. EXAM: PORTABLE CHEST 1 VIEW COMPARISON:  Chest radiograph and CT 12/19/2019 FINDINGS: Previous right upper lobe opacity has resolved. No new airspace disease. The heart is normal in size, post TAVR. Aortic atherosclerosis with unchanged mediastinal contours. No pleural effusion, pulmonary edema, or pneumothorax. The bones are under mineralized. Paucity of body fat. IMPRESSION: No acute chest findings. Resolved right upper lobe opacity from prior. Electronically Signed   By: Keith Rake M.D.   On: 03/05/2020 17:51    EKG: Independently reviewed.   Assessment/PlaN  Failure to thrive/dehydration Likely worsening progression of metastatic prostate cancer Continuous IV fluids Palliative care consult in the morning  Prolonged QT QTc of 507 Avoid QT prolongation meds  Prostate cancer with metastasis to bone On Xtandi and follows with oncology outpatient   Bullous Pemphigoid  Continue Methotrexate and doxycycline Follows with dermatology at [facility]  ITP Platelets are stable   CAD s/p stent   stable  Aortic valve disease s/p TAVR Pt developed enterococcal bacteremia  in 09/2019 but prosthetic valve was unable to be assessed on TEE due to large hiatal hernia and he was started on IV antibiotics and transitioned to chronic suppressive antibiotics.  Continue amoxicillin  History of seizures Continue Lamictal  Stage 1 sacral ulcer Wound care per RN  DVT prophylaxis:.Lovenox Code Status: Full Family Communication: Plan discussed with patient at bedside. Unable to reach family by phone  disposition Plan: SNF after observation Consults called:    Status is: Observation  The patient remains OBS appropriate and will d/c before 2 midnights.  Dispo: The patient is from: SNF              Anticipated d/c is to: SNF              Anticipated d/c date is: 1 day              Patient currently is not medically stable to d/c.         Orene Desanctis DO [facility]   If 7PM-7AM, please contact night-coverage [email or web address]   03/05/2020, 10:20 PM

## 2020-03-05 NOTE — ED Notes (Signed)
Report given to Izora Gala, 6N RN, pt stable, AO x4, no concern gotten from unit nurse, Message left on voicemail to wife with pt's room and visitor hours.

## 2020-03-05 NOTE — ED Notes (Signed)
Corey Oneill, daughter, [phone] would like an update when available

## 2020-03-05 NOTE — ED Provider Notes (Signed)
[facility] EMERGENCY DEPARTMENT Provider Note   CSN: [id number] Arrival date & time: 03/05/20  1658     History No chief complaint on file.   Corey Oneill is a 84 y.o. male.  HPI   84 year old male with a past medical history of CAD status post PCI with drug-eluting stent placement in 2016, HLD, HTN, metastatic prostate cancer, presenting to the emergency department brought in by EMS complaining of generalized weakness associated with decreased appetite.  Patient reports that he is lost 30 pounds of weight over the past month.  He denies any fevers, chills, cough, congestion, rhinorrhea, abdominal pain, changes in bowel or bladder function.  He reports taking enzalutamide and methotrexate for his cancer therapy.  The past 2 weeks he has had profound fatigue and generalized weakness and his wife became concerned today prompting her to call 911 and send the patient to the emergency department for further evaluation.  Patient denies any new rashes, nausea, vomiting, diarrhea, lightheadedness or dizziness.  Past Medical History:  Diagnosis Date  . Abdominal aortic atherosclerosis ([place])   . Anginal pain ([place])    last noted 08/17/19  . Bone cancer ([place])    Left hip  . BPPV (benign paroxysmal positional vertigo)   . Bullous pemphigoid   . CAD (coronary artery disease)    a.  LHC 8/16: Mid to distal LAD 30%, OM1 40%, proximal mid RCA 40%, distal RCA 60% >> FFR 0.69  >> PCI: 3 x 15 mm Resolute DES  . Depression   . Esophageal reflux   . Grade I diastolic dysfunction 123456   Noted on ECHO   . H/O blood clots    "get them in my stool and urine" (11/12/2015)  . Heart murmur   . Hepatic lesion 12/08/2015   Stable 8 mm right hepatic lesion  . History of aortic stenosis    a. peak to peak gradient by LHC 8/16:  37 mmHg (moderately severe)   . History of appendicitis 2016  . History of blood transfusion 12/2018  . History of herpes labialis   . History of ITP    2018, thrombocytopenia  . History of kidney stones   . Hx of radiation therapy 04/14/13-06/09/13   prostate 7800 cGy, 40 sessions, seminal vesicles 5600 cGy 40 sessions  . Hydrocele 2006   Small  . Hyperlipidemia   . Insomnia    resolved  . LVH (left ventricular hypertrophy)    Moderate, noted on ECHO 09/2018  . Malignant melanoma in situ ([place]) 09/04/2016   Right neck and chest  . Metastasis from malignant neoplasm of prostate ([place]) 02/25/2019  . Prostate cancer ([place]) dx'd 2014  . Radiation proctitis   . Renal mass 2017   Bilateral renal masses  . S/P skin biopsy 04/09/2019   subepidermal cell poor vesicle , Linear IGG and C3 the basement membrane  . Suprapubic catheter ([place])   . Wears hearing aid in both ears     Patient Active Problem List   Diagnosis Date Noted  . Aspiration pneumonia ([place]) 12/20/2019  . Pelvic fracture ([place]) 12/19/2019  . Seizures ([place]) 11/26/2019  . Essential hypertension 11/25/2019  . Pressure injury of skin 11/15/2019  . TIA (transient ischemic attack) 11/15/2019  . Word finding difficulty 11/14/2019  . Transient alteration of awareness 09/25/2019  . Idiopathic thrombocytopenic purpura (ITP) ([facility]) 09/25/2019  . Macrocytic anemia 09/25/2019  . Bacteremia due to Enterococcus 09/11/2019  . Bullous pemphigoid 06/12/2019  . Metastasis from malignant  neoplasm of prostate ([place]) 02/25/2019  . Radiation proctitis   . Hx of radiation therapy   . Heart murmur   . H/O blood clots   . Family history of adverse reaction to anesthesia   . CAD (coronary artery disease)   . Aortic stenosis   . Anginal pain ([place])   . History of ITP 02/26/2017  . 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 S/P appendectomy 11/12/2015  . Esophageal reflux 03/22/2015  . Depression 11/20/2014  . Prostate cancer ([place]) 03/05/2013  . Erectile dysfunction   .  Hyperlipidemia 06/07/2007    Past Surgical History:  Procedure Laterality Date  . CARDIAC CATHETERIZATION N/A 04/21/2015   Procedure: Right/Left Heart Cath and Coronary Angiography;  Surgeon: [name], MD;  Location: [facility];  Service: Cardiovascular;  Laterality: N/A;  . CARDIAC CATHETERIZATION N/A 06/18/2015   Procedure: Intravascular Pressure Wire/FFR Study;  Surgeon: [name], MD;  Location: [facility];  Service: Cardiovascular;  Laterality: N/A;  . CARDIAC CATHETERIZATION N/A 06/18/2015   Procedure: Coronary Stent Intervention;  Surgeon: [name], MD;  Location: [facility];  Service: Cardiovascular;  Laterality: N/A;  . CARDIAC CATHETERIZATION N/A 06/18/2015   Procedure: Right/Left Heart Cath and Coronary Angiography;  Surgeon: [name], MD;  Location: [facility];  Service: Cardiovascular;  Laterality: N/A;  . CARDIAC CATHETERIZATION N/A 06/23/2015   Procedure: Left Heart Cath and Cors/Grafts Angiography;  Surgeon: [name], MD;  Location: [facility];  Service: Cardiovascular;  Laterality: N/A;  . CARDIAC CATHETERIZATION N/A 01/17/2016   Procedure: Right/Left Heart Cath and Coronary Angiography;  Surgeon: [name], MD;  Location: [facility];  Service: Cardiovascular;  Laterality: N/A;  . CATARACT EXTRACTION, BILATERAL    . COLONOSCOPY  06/26/2017  . CYSTOSCOPY WITH BIOPSY N/A 07/30/2018   Procedure: CYSTOSCOPY WITH BIOPSY/FULGURATION, CYSTOLTHOLAPAXY;  Surgeon: [name], MD;  Location: [facility];  Service: Urology;  Laterality: N/A;  . CYSTOSCOPY WITH URETHRAL DILATATION N/A 08/18/2019   Procedure: CYSTOSCOPY WITH URETHRAL DILATATION USING BALLOON OR LASER/ SPURO PUBIC TUBE CHANGE LASER EXCISION OF URETHRAL STRICTURE RETROGRADE URETHROGRAM ANTEGRADE CYSTOGRAM;  Surgeon: [name], MD;  Location: [facility];  Service: Urology;  Laterality: N/A;  . FRACTURE SURGERY    . INGUINAL HERNIA  REPAIR     patient does not remember this procedure  . LAPAROSCOPIC APPENDECTOMY N/A 11/16/2015   Procedure: APPENDECTOMY LAPAROSCOPIC;  Surgeon: [name], MD;  Location: [facility];  Service: General;  Laterality: N/A;  . LEFT HEART CATH AND CORONARY ANGIOGRAPHY N/A 12/26/2016   Procedure: Left Heart Cath and Coronary Angiography;  Surgeon: Troy Sine, MD;  Location: [facility];  Service: Cardiovascular;  Laterality: N/A;  . MELANOMA EXCISION Right 10/24/2016   Procedure: WIDE EXCISION MELANOMA RIGHT NECK AND RIGHT CHEST TIMES 2;  Surgeon: [name], MD;  Location: [facility];  Service: General;  Laterality: Right;  right medial and lateral lesion of chest and right neck  . NASAL FRACTURE SURGERY     "broken years ago; several ORs to correct it"  . NASAL SEPTUM SURGERY    . PROSTATE BIOPSY  2014   "needle biopsy"  . TEE WITHOUT CARDIOVERSION N/A 02/15/2016   Procedure: TRANSESOPHAGEAL ECHOCARDIOGRAM (TEE);  Surgeon: [name], MD;  Location: [facility];  Service: Open Heart  Surgery;  Laterality: N/A;  . TEE WITHOUT CARDIOVERSION N/A 08/26/2019   Procedure: TRANSESOPHAGEAL ECHOCARDIOGRAM (TEE);  Surgeon: Pixie Casino, MD;  Location: [facility];  Service: Cardiovascular;  Laterality: N/A;  . TONSILLECTOMY    . TRANSCATHETER AORTIC VALVE REPLACEMENT, TRANSFEMORAL  02/15/2016  . TRANSCATHETER AORTIC VALVE REPLACEMENT, TRANSFEMORAL N/A 02/15/2016   Procedure: TRANSCATHETER AORTIC VALVE REPLACEMENT, TRANSFEMORAL;  Surgeon: [name], MD;  Location: [facility];  Service: Open Heart Surgery;  Laterality: N/A;  . TRANSURETHRAL RESECTION OF PROSTATE  12/23/2018   Procedure: CYSTOSCOPY WITH  BLADDER BIOPSY WITH FULGERATION/ TRANSURETHRAL RESECTION PROSTATE  AND PROSTATE BIOPSY;  Surgeon: [name], MD;  Location: [facility];  Service: Urology;;       Family History  Problem Relation Age of Onset  . Brain cancer Father   . Lymphoma Mother   . Depression Daughter      Social History   Tobacco Use  . Smoking status: Former Smoker    Packs/day: 0.00    Types: Cigarettes  . Smokeless tobacco: Never Used  . Tobacco comment: "quit smoking cigarettes in the 1960s; quit smoking cigars in the 1990s  Substance Use Topics  . Alcohol use: Yes    Comment: 2 DRINKS PER DAY  . Drug use: No    Home Medications Prior to Admission medications   Medication Sig Start Date End Date Taking? Authorizing Provider  acetaminophen (TYLENOL) 500 MG tablet Take 500 mg by mouth every 6 (six) hours as needed for mild pain or moderate pain.     [provider]  amoxicillin (AMOXIL) 500 MG tablet Take 1 tablet (500 mg total) by mouth 2 (two) times daily. 11/07/19   Thayer Headings, MD  aspirin EC 81 MG tablet Take 81 mg by mouth every evening.     [provider]  Calcium-Phosphorus-Vitamin D (CITRACAL +D3 PO) Take 1 tablet by mouth at bedtime.    [provider]  carvedilol (COREG) 3.125 MG tablet TAKE 1 TABLET BY MOUTH TWICE DAILY. 02/26/20   [name], MD  cyanocobalamin 1000 MCG tablet Take 1,000 mcg by mouth daily.    [provider]  doxycycline (MONODOX) 100 MG capsule Take 100 mg by mouth 2 (two) times daily.  11/18/19   [provider]  DULoxetine (CYMBALTA) 60 MG capsule Take 1 capsule (60 mg total) by mouth daily. 12/16/19   [name], MD  enzalutamide Gillermina Phy) 40 MG capsule Take 160 mg by mouth daily with supper.     [provider]  ferrous sulfate (FEROSUL) 325 (65 FE) MG tablet Take 325 mg by mouth daily with breakfast.    [provider]  folic acid (FOLVITE) 1 MG tablet Take 1 mg by mouth See admin instructions. Take 1 mg by mouth once a day on Sun/Mon/Wed/Thurs/Fri/Sat (not on Tuesdays)    [provider]  ibuprofen (ADVIL) 200 MG tablet Take 200-400 mg by mouth every 6 (six) hours as needed (for pain or inflammation).    [provider]  isosorbide mononitrate (IMDUR) 30  MG 24 hr tablet TAKE 1 TABLET ONCE DAILY. 02/11/20   [name], MD  lamoTRIgine (LAMICTAL) 25 MG tablet One tablet twice a day for 2 weeks, then take 2 tablets twice a day for 2 weeks, then take 3 tablets twice a day 12/01/19   Kathrynn Ducking, MD  MAGNESIUM PO Take 1 tablet by mouth daily.     [provider]  methotrexate (RHEUMATREX) 2.5 MG tablet Take 10  mg by mouth every Tuesday.     [provider]  MULTIPLE VITAMIN PO Take 1 tablet by mouth daily.    [provider]  nitroGLYCERIN (NITROSTAT) 0.4 MG SL tablet 1 TAB UNDER TONGUE AS NEEDED FOR CHEST PAIN. MAY REPEAT EVERY 5 MIN FOR A TOTAL OF 3 DOSES. 07/07/19   [name], MD  polyethylene glycol (MIRALAX / GLYCOLAX) 17 g packet Take 17 g by mouth daily as needed for mild constipation. 12/24/19   Shelly Coss, MD  rosuvastatin (CRESTOR) 10 MG tablet TAKE 1 TABLET ONCE DAILY. 05/26/19   Tysinger, Camelia Eng, PA-C  traMADol (ULTRAM) 50 MG tablet Take 1 tablet (50 mg total) by mouth every 6 (six) hours as needed (for pain). 12/24/19   Shelly Coss, MD  Zinc Oxide (DESITIN) 13 % CREA Apply 1 application topically 2 (two) times daily. 03/05/20   [name], MD    Allergies    Prednisone  Review of Systems   Review of Systems  Constitutional: Positive for activity change, appetite change, fatigue and unexpected weight change. Negative for chills, diaphoresis and fever.  HENT: Negative for congestion and rhinorrhea.   Respiratory: Negative for cough, shortness of breath and wheezing.   Cardiovascular: Negative for chest pain.  Gastrointestinal: Negative for abdominal distention, abdominal pain, diarrhea, nausea and vomiting.  Genitourinary: Negative for decreased urine volume, difficulty urinating, dysuria, frequency and urgency.  Musculoskeletal: Negative for gait problem.  Skin: Negative for color change and wound.  Neurological: Positive for weakness (generalized). Negative for dizziness, syncope,  light-headedness and headaches.  All other systems reviewed and are negative.   Physical Exam Updated Vital Signs BP (!) 143/81   Pulse 90   Temp 98 F (36.7 C) (Oral)   Resp 16   Ht 5\' 10"  (1.778 m)   Wt 52.6 kg   SpO2 100%   BMI 16.64 kg/m   Physical Exam Vitals and nursing note reviewed.  Constitutional:      General: He is not in acute distress.    Appearance: Normal appearance. He is cachectic. He is ill-appearing.  HENT:     Head: Normocephalic.     Right Ear: External ear normal.     Left Ear: External ear normal.     Nose: Nose normal.     Mouth/Throat:     Mouth: Mucous membranes are dry.     Pharynx: Oropharynx is clear.  Eyes:     Extraocular Movements: Extraocular movements intact.     Comments: Sunken  Cardiovascular:     Rate and Rhythm: Regular rhythm. Tachycardia present.     Pulses: Normal pulses.     Heart sounds: Normal heart sounds.  Pulmonary:     Effort: Pulmonary effort is normal. No respiratory distress.     Breath sounds: Normal breath sounds. No wheezing or rhonchi.  Abdominal:     General: Abdomen is scaphoid. Bowel sounds are normal.     Palpations: Abdomen is soft.     Tenderness: There is no abdominal tenderness. There is no guarding.  Musculoskeletal:     Cervical back: Normal range of motion.     Right lower leg: No edema.     Left lower leg: No edema.  Skin:    General: Skin is warm and dry.     Capillary Refill: Capillary refill takes less than 2 seconds.  Neurological:     General: No focal deficit present.     Mental Status: He is alert and oriented to  person, place, and time. Mental status is at baseline.  Psychiatric:        Mood and Affect: Mood normal.     ED Results / Procedures / Treatments   Labs (all labs ordered are listed, but only abnormal results are displayed) Labs Reviewed  COMPREHENSIVE METABOLIC PANEL - Abnormal; Notable for the following components:      Result Value   Sodium 130 (*)    Chloride 92  (*)    Glucose, Bld 103 (*)    Albumin 3.2 (*)    Anion gap 16 (*)    All other components within normal limits  CBC WITH DIFFERENTIAL/PLATELET - Abnormal; Notable for the following components:   RBC 3.53 (*)    Hemoglobin 11.8 (*)    HCT 35.2 (*)    Platelets 97 (*)    All other components within normal limits  SARS CORONAVIRUS 2 (TAT 6-24 HRS)  MAGNESIUM  URINALYSIS, ROUTINE W REFLEX MICROSCOPIC    EKG EKG Interpretation  Date/Time:  Friday March 05 2020 17:06:11 EDT Ventricular Rate:  99 PR Interval:    QRS Duration: 91 QT Interval:  395 QTC Calculation: 507 R Axis:   82 Text Interpretation: Sinus tachycardia Atrial premature complex Biatrial enlargement Borderline right axis deviation Nonspecific T abnormalities, lateral leads Prolonged QT interval No significant change since last tracing Confirmed by Deno Etienne [id number]) on 03/05/2020 5:38:58 PM   Radiology DG Chest Portable 1 View  Result Date: 03/05/2020 CLINICAL DATA:  Failure to thrive. No appetite. EXAM: PORTABLE CHEST 1 VIEW COMPARISON:  Chest radiograph and CT 12/19/2019 FINDINGS: Previous right upper lobe opacity has resolved. No new airspace disease. The heart is normal in size, post TAVR. Aortic atherosclerosis with unchanged mediastinal contours. No pleural effusion, pulmonary edema, or pneumothorax. The bones are under mineralized. Paucity of body fat. IMPRESSION: No acute chest findings. Resolved right upper lobe opacity from prior. Electronically Signed   By: Keith Rake M.D.   On: 03/05/2020 17:51    Procedures Procedures (including critical care time)  Medications Ordered in ED Medications  lactated ringers infusion (has no administration in time range)  lactated ringers bolus 1,000 mL (1,000 mLs Intravenous New Bag/Given 03/05/20 1906)    ED Course  I have reviewed the triage vital signs and the nursing notes.  Pertinent labs & imaging results that were available during my care of the patient were  reviewed by me and considered in my medical decision making (see chart for details).    MDM Rules/Calculators/A&P                      84 year old male with a past medical history of CAD status post PCI with drug-eluting stent placement in 2016, HLD, HTN, metastatic prostate cancer, presenting to the emergency department brought in by EMS complaining of generalized weakness associated with decreased appetite  Differential diagnoses considered include failure to thrive, methotrexate toxicity, profound dehydration, electrolyte derangements, anemia, pancytopenia, occult infection less likely given no localizing symptoms  Initial interventions 1 L IV LR  Discussed with patient his wishes in terms of intervention should his heart stop or he did unable to breathe and the patient expressed that he would wish for CPR, shocks to start if necessary and mechanical ventilation if needed.  ECG interpreted by me demonstrated Sinus tachycardia in 100 bpm, normal axis, prolonged QTC of 507 ms, biatrial enlargement, nonspecific lateral T wave flattening otherwise no ST or T wave changes suggestive of acute ischemia,  QTC increased compared to previous 477 ms otherwise no significant changes compared to previous  CXR interpreted by me demonstrated no acute cardiopulmonary processes  Labs demonstrated no significant leukocytosis, normocytic anemia with a hemoglobin of 11.8 with a MCV of 99.7 which appears improved compared to previous, thrombocytopenia appears stable compared to previous at 97, mild hyponatremia 130, mild hypochloremia 92, mild hypoalbuminemia 3.2 slight bump in creatinine from 0.7 to 0.91, slight anion gap 16 possibly secondary to malnutrition and ketoacidosis, otherwise no sign arrangements on CMP, magnesium 1.9  Upon reassessment the patient reports feeling slightly improved following fluids.  After shared decision-making conversation with both the patient and his wife, they felt adamant that he  warrants admission to the hospital for further IV fluids with concern that he is decompensating rapidly at home with limited resources in his assisted living facility.  Given the patient's appreciable dehydration and profound cachexia on exam, I feel that this is an appropriate decision.  We will admit to hospitalist for IV fluids and ongoing evaluation, monitoring and management  Strongly recommend consideration of a palliative care consultation during this hospitalization for discussion of goals of care as well as adjunct therapies for appetite stimulation and additional therapies at home.  The plan for this patient was discussed with Dr. Tyrone Nine, who voiced agreement and who oversaw evaluation and treatment of this patient.  Final Clinical Impression(s) / ED Diagnoses Final diagnoses:  Dehydration  Failure to thrive in adult    Rx / DC Orders ED Discharge Orders    None       Filbert Berthold, MD 03/05/20 2147    Deno Etienne, DO 03/05/20 2208

## 2020-03-05 NOTE — ED Notes (Signed)
Report gotten from [facility].

## 2020-03-05 NOTE — Addendum Note (Signed)
Addended by: [name] on: 03/05/2020 10:42 AM   Modules accepted: Orders

## 2020-03-05 NOTE — ED Notes (Signed)
Corey Oneill wife [phone] call with any updates

## 2020-03-05 NOTE — ED Triage Notes (Signed)
3-4 day hx of generalized weakness, decreased activity, non-compliance of meds and poor intake.  Pt alert an oriented. Denies any N/V/D or pain.  Does have tenderness to lower spine and EMS noted redness to sacrum.  Hx of prostate CA.

## 2020-03-05 NOTE — Telephone Encounter (Signed)
I called in [place]

## 2020-03-06 ENCOUNTER — Other Ambulatory Visit: Payer: Self-pay

## 2020-03-06 DIAGNOSIS — Z952 Presence of prosthetic heart valve: Secondary | ICD-10-CM | POA: Diagnosis not present

## 2020-03-06 DIAGNOSIS — Z66 Do not resuscitate: Secondary | ICD-10-CM

## 2020-03-06 DIAGNOSIS — R410 Disorientation, unspecified: Secondary | ICD-10-CM | POA: Diagnosis not present

## 2020-03-06 DIAGNOSIS — Z681 Body mass index (BMI) 19 or less, adult: Secondary | ICD-10-CM | POA: Diagnosis not present

## 2020-03-06 DIAGNOSIS — R296 Repeated falls: Secondary | ICD-10-CM | POA: Diagnosis present

## 2020-03-06 DIAGNOSIS — Z7189 Other specified counseling: Secondary | ICD-10-CM | POA: Diagnosis not present

## 2020-03-06 DIAGNOSIS — I499 Cardiac arrhythmia, unspecified: Secondary | ICD-10-CM | POA: Diagnosis not present

## 2020-03-06 DIAGNOSIS — R569 Unspecified convulsions: Secondary | ICD-10-CM | POA: Diagnosis not present

## 2020-03-06 DIAGNOSIS — R627 Adult failure to thrive: Secondary | ICD-10-CM | POA: Diagnosis present

## 2020-03-06 DIAGNOSIS — M6281 Muscle weakness (generalized): Secondary | ICD-10-CM | POA: Diagnosis present

## 2020-03-06 DIAGNOSIS — Z923 Personal history of irradiation: Secondary | ICD-10-CM | POA: Diagnosis not present

## 2020-03-06 DIAGNOSIS — Z20822 Contact with and (suspected) exposure to covid-19: Secondary | ICD-10-CM | POA: Diagnosis present

## 2020-03-06 DIAGNOSIS — Z515 Encounter for palliative care: Principal | ICD-10-CM

## 2020-03-06 DIAGNOSIS — I25119 Atherosclerotic heart disease of native coronary artery with unspecified angina pectoris: Secondary | ICD-10-CM | POA: Diagnosis not present

## 2020-03-06 DIAGNOSIS — Z8673 Personal history of transient ischemic attack (TIA), and cerebral infarction without residual deficits: Secondary | ICD-10-CM | POA: Diagnosis not present

## 2020-03-06 DIAGNOSIS — D693 Immune thrombocytopenic purpura: Secondary | ICD-10-CM | POA: Diagnosis present

## 2020-03-06 DIAGNOSIS — C799 Secondary malignant neoplasm of unspecified site: Secondary | ICD-10-CM | POA: Diagnosis not present

## 2020-03-06 DIAGNOSIS — I5032 Chronic diastolic (congestive) heart failure: Secondary | ICD-10-CM | POA: Diagnosis present

## 2020-03-06 DIAGNOSIS — E43 Unspecified severe protein-calorie malnutrition: Secondary | ICD-10-CM | POA: Diagnosis present

## 2020-03-06 DIAGNOSIS — R634 Abnormal weight loss: Secondary | ICD-10-CM | POA: Diagnosis not present

## 2020-03-06 DIAGNOSIS — R279 Unspecified lack of coordination: Secondary | ICD-10-CM | POA: Diagnosis not present

## 2020-03-06 DIAGNOSIS — K5900 Constipation, unspecified: Secondary | ICD-10-CM | POA: Diagnosis present

## 2020-03-06 DIAGNOSIS — R7881 Bacteremia: Secondary | ICD-10-CM | POA: Diagnosis not present

## 2020-03-06 DIAGNOSIS — I4581 Long QT syndrome: Secondary | ICD-10-CM | POA: Diagnosis not present

## 2020-03-06 DIAGNOSIS — L120 Bullous pemphigoid: Secondary | ICD-10-CM | POA: Diagnosis present

## 2020-03-06 DIAGNOSIS — R1310 Dysphagia, unspecified: Secondary | ICD-10-CM | POA: Diagnosis present

## 2020-03-06 DIAGNOSIS — R9431 Abnormal electrocardiogram [ECG] [EKG]: Secondary | ICD-10-CM | POA: Diagnosis not present

## 2020-03-06 DIAGNOSIS — Z862 Personal history of diseases of the blood and blood-forming organs and certain disorders involving the immune mechanism: Secondary | ICD-10-CM | POA: Diagnosis not present

## 2020-03-06 DIAGNOSIS — C7951 Secondary malignant neoplasm of bone: Secondary | ICD-10-CM | POA: Diagnosis present

## 2020-03-06 DIAGNOSIS — E872 Acidosis: Secondary | ICD-10-CM | POA: Diagnosis present

## 2020-03-06 DIAGNOSIS — Z86006 Personal history of melanoma in-situ: Secondary | ICD-10-CM | POA: Diagnosis not present

## 2020-03-06 DIAGNOSIS — Z8546 Personal history of malignant neoplasm of prostate: Secondary | ICD-10-CM | POA: Diagnosis not present

## 2020-03-06 DIAGNOSIS — G40909 Epilepsy, unspecified, not intractable, without status epilepticus: Secondary | ICD-10-CM | POA: Diagnosis not present

## 2020-03-06 DIAGNOSIS — F329 Major depressive disorder, single episode, unspecified: Secondary | ICD-10-CM | POA: Diagnosis present

## 2020-03-06 DIAGNOSIS — I110 Hypertensive heart disease with heart failure: Secondary | ICD-10-CM | POA: Diagnosis not present

## 2020-03-06 DIAGNOSIS — I350 Nonrheumatic aortic (valve) stenosis: Secondary | ICD-10-CM | POA: Diagnosis not present

## 2020-03-06 DIAGNOSIS — E860 Dehydration: Secondary | ICD-10-CM | POA: Diagnosis present

## 2020-03-06 DIAGNOSIS — K219 Gastro-esophageal reflux disease without esophagitis: Secondary | ICD-10-CM | POA: Diagnosis not present

## 2020-03-06 DIAGNOSIS — B952 Enterococcus as the cause of diseases classified elsewhere: Secondary | ICD-10-CM | POA: Diagnosis not present

## 2020-03-06 DIAGNOSIS — F29 Unspecified psychosis not due to a substance or known physiological condition: Secondary | ICD-10-CM | POA: Diagnosis not present

## 2020-03-06 DIAGNOSIS — C61 Malignant neoplasm of prostate: Secondary | ICD-10-CM | POA: Diagnosis not present

## 2020-03-06 DIAGNOSIS — Z741 Need for assistance with personal care: Secondary | ICD-10-CM | POA: Diagnosis not present

## 2020-03-06 DIAGNOSIS — Z955 Presence of coronary angioplasty implant and graft: Secondary | ICD-10-CM | POA: Diagnosis not present

## 2020-03-06 DIAGNOSIS — E871 Hypo-osmolality and hyponatremia: Secondary | ICD-10-CM | POA: Diagnosis present

## 2020-03-06 DIAGNOSIS — L89151 Pressure ulcer of sacral region, stage 1: Secondary | ICD-10-CM | POA: Diagnosis not present

## 2020-03-06 DIAGNOSIS — I5030 Unspecified diastolic (congestive) heart failure: Secondary | ICD-10-CM | POA: Diagnosis not present

## 2020-03-06 DIAGNOSIS — Z743 Need for continuous supervision: Secondary | ICD-10-CM | POA: Diagnosis not present

## 2020-03-06 DIAGNOSIS — R32 Unspecified urinary incontinence: Secondary | ICD-10-CM | POA: Diagnosis not present

## 2020-03-06 DIAGNOSIS — R159 Full incontinence of feces: Secondary | ICD-10-CM | POA: Diagnosis not present

## 2020-03-06 LAB — URINALYSIS, ROUTINE W REFLEX MICROSCOPIC
Bacteria, UA: NONE SEEN
Bilirubin Urine: NEGATIVE
Glucose, UA: NEGATIVE mg/dL
Hgb urine dipstick: NEGATIVE
Ketones, ur: 80 mg/dL — AB
Leukocytes,Ua: NEGATIVE
Nitrite: NEGATIVE
Protein, ur: 100 mg/dL — AB
Specific Gravity, Urine: 1.023 (ref 1.005–1.030)
pH: 5 (ref 5.0–8.0)

## 2020-03-06 LAB — RESPIRATORY PANEL BY RT PCR (FLU A&B, COVID)
Influenza A by PCR: NEGATIVE
Influenza B by PCR: NEGATIVE
SARS Coronavirus 2 by RT PCR: NEGATIVE

## 2020-03-06 LAB — BASIC METABOLIC PANEL
Anion gap: 16 — ABNORMAL HIGH (ref 5–15)
BUN: 9 mg/dL (ref 8–23)
CO2: 21 mmol/L — ABNORMAL LOW (ref 22–32)
Calcium: 8.7 mg/dL — ABNORMAL LOW (ref 8.9–10.3)
Chloride: 97 mmol/L — ABNORMAL LOW (ref 98–111)
Creatinine, Ser: 0.68 mg/dL (ref 0.61–1.24)
GFR calc Af Amer: >60 mL/min (ref >60)
GFR calc non Af Amer: >60 mL/min (ref >60)
Glucose, Bld: 92 mg/dL (ref 70–99)
Potassium: 3.6 mmol/L (ref 3.5–5.1)
Sodium: 134 mmol/L — ABNORMAL LOW (ref 135–145)

## 2020-03-06 LAB — CBC
HCT: 30.5 % — ABNORMAL LOW (ref 39.0–52.0)
Hemoglobin: 10.3 g/dL — ABNORMAL LOW (ref 13.0–17.0)
MCH: 33.2 pg (ref 26.0–34.0)
MCHC: 33.8 g/dL (ref 30.0–36.0)
MCV: 98.4 fL (ref 80.0–100.0)
Platelets: 95 10*3/uL — ABNORMAL LOW (ref 150–400)
RBC: 3.1 MIL/uL — ABNORMAL LOW (ref 4.22–5.81)
RDW: 14.4 % (ref 11.5–15.5)
WBC: 8.2 10*3/uL (ref 4.0–10.5)
nRBC: 0 % (ref 0.0–0.2)

## 2020-03-06 LAB — TSH: TSH: 4.094 u[IU]/mL (ref 0.350–4.500)

## 2020-03-06 LAB — MRSA PCR SCREENING: MRSA by PCR: NEGATIVE

## 2020-03-06 MED ORDER — ENSURE ENLIVE PO LIQD
237.0000 mL | Freq: Three times a day (TID) | ORAL | Status: DC
  Administered 2020-03-06 (×2): 237 mL via ORAL

## 2020-03-06 MED ORDER — LORAZEPAM 2 MG/ML IJ SOLN: 0.5000 mg | INTRAMUSCULAR | Status: DC | PRN

## 2020-03-06 MED ORDER — GLYCOPYRROLATE 0.2 MG/ML IJ SOLN
0.4000 mg | INTRAMUSCULAR | Status: DC | PRN
  Administered 2020-03-06 – 2020-03-07 (×2): 0.4 mg via INTRAVENOUS
  Filled 2020-03-06 (×2): qty 2

## 2020-03-06 MED ORDER — DOXYCYCLINE HYCLATE 100 MG PO TABS
100.0000 mg | ORAL_TABLET | Freq: Two times a day (BID) | ORAL | Status: DC
  Administered 2020-03-06: 08:00:00 100 mg via ORAL
  Filled 2020-03-06: qty 1

## 2020-03-06 MED ORDER — ACETAMINOPHEN 325 MG PO TABS
650.0000 mg | ORAL_TABLET | Freq: Four times a day (QID) | ORAL | Status: DC | PRN
  Filled 2020-03-06: qty 2

## 2020-03-06 MED ORDER — ASPIRIN EC 81 MG PO TBEC: 81.0000 mg | DELAYED_RELEASE_TABLET | Freq: Every evening | ORAL | Status: DC

## 2020-03-06 MED ORDER — POLYETHYLENE GLYCOL 3350 17 G PO PACK
17.0000 g | PACK | Freq: Every day | ORAL | Status: DC
  Administered 2020-03-06 – 2020-03-07 (×2): 17 g via ORAL
  Filled 2020-03-06 (×2): qty 1

## 2020-03-06 MED ORDER — SENNA 8.6 MG PO TABS
1.0000 | ORAL_TABLET | Freq: Every day | ORAL | Status: DC
  Administered 2020-03-06 – 2020-03-07 (×2): 8.6 mg via ORAL
  Filled 2020-03-06 (×3): qty 1

## 2020-03-06 MED ORDER — MAGNESIUM OXIDE 400 (241.3 MG) MG PO TABS
400.0000 mg | ORAL_TABLET | Freq: Two times a day (BID) | ORAL | Status: DC
  Administered 2020-03-06: 14:00:00 400 mg via ORAL
  Filled 2020-03-06: qty 1

## 2020-03-06 MED ORDER — ROSUVASTATIN CALCIUM 5 MG PO TABS
10.0000 mg | ORAL_TABLET | Freq: Every day | ORAL | Status: DC
  Administered 2020-03-06: 08:00:00 10 mg via ORAL
  Filled 2020-03-06: qty 2

## 2020-03-06 MED ORDER — MIRTAZAPINE 15 MG PO TBDP
15.0000 mg | ORAL_TABLET | Freq: Every day | ORAL | Status: DC
  Filled 2020-03-06: qty 1

## 2020-03-06 MED ORDER — LAMOTRIGINE 25 MG PO TABS
75.0000 mg | ORAL_TABLET | Freq: Two times a day (BID) | ORAL | Status: DC
  Administered 2020-03-06 – 2020-03-07 (×3): 75 mg via ORAL
  Filled 2020-03-06 (×5): qty 3

## 2020-03-06 MED ORDER — ENZALUTAMIDE 40 MG PO CAPS: 160.0000 mg | ORAL_CAPSULE | Freq: Every day | ORAL | Status: DC

## 2020-03-06 MED ORDER — BISACODYL 10 MG RE SUPP: 10.0000 mg | Freq: Every day | RECTAL | Status: DC | PRN

## 2020-03-06 MED ORDER — ISOSORBIDE MONONITRATE ER 30 MG PO TB24
30.0000 mg | ORAL_TABLET | Freq: Every day | ORAL | Status: DC
  Administered 2020-03-06: 30 mg via ORAL
  Filled 2020-03-06: qty 1

## 2020-03-06 MED ORDER — ONDANSETRON HCL 4 MG/2ML IJ SOLN
4.0000 mg | Freq: Four times a day (QID) | INTRAMUSCULAR | Status: DC
  Administered 2020-03-07: 4 mg via INTRAVENOUS
  Filled 2020-03-06: qty 2

## 2020-03-06 MED ORDER — HYPROMELLOSE (GONIOSCOPIC) 2.5 % OP SOLN
1.0000 [drp] | Freq: Three times a day (TID) | OPHTHALMIC | Status: DC
  Administered 2020-03-07 – 2020-03-08 (×3): 1 [drp] via OPHTHALMIC
  Filled 2020-03-06 (×2): qty 15

## 2020-03-06 MED ORDER — POTASSIUM CHLORIDE CRYS ER 20 MEQ PO TBCR
40.0000 meq | EXTENDED_RELEASE_TABLET | Freq: Once | ORAL | Status: DC
  Administered 2020-03-06: 14:00:00 40 meq via ORAL
  Filled 2020-03-06: qty 2

## 2020-03-06 MED ORDER — BIOTENE DRY MOUTH MT LIQD: 15.0000 mL | OROMUCOSAL | Status: DC | PRN

## 2020-03-06 MED ORDER — MORPHINE SULFATE (PF) 2 MG/ML IV SOLN
1.0000 mg | INTRAVENOUS | Status: DC | PRN
  Administered 2020-03-08: 1 mg via INTRAVENOUS
  Filled 2020-03-06: qty 1

## 2020-03-06 MED ORDER — ACETAMINOPHEN 650 MG RE SUPP: 650.0000 mg | Freq: Four times a day (QID) | RECTAL | Status: DC | PRN

## 2020-03-06 MED ORDER — METHOTREXATE 2.5 MG PO TABS: 10.0000 mg | ORAL_TABLET | ORAL | Status: DC

## 2020-03-06 MED ORDER — AMOXICILLIN 500 MG PO CAPS
500.0000 mg | ORAL_CAPSULE | Freq: Two times a day (BID) | ORAL | Status: DC
  Administered 2020-03-06: 500 mg via ORAL
  Filled 2020-03-06 (×2): qty 1

## 2020-03-06 MED ORDER — MORPHINE SULFATE 10 MG/5ML PO SOLN
2.5000 mg | ORAL | Status: DC | PRN
  Administered 2020-03-07: 2.5 mg via ORAL
  Filled 2020-03-06: qty 2

## 2020-03-06 NOTE — Plan of Care (Signed)
  Problem: Health Behavior/Discharge Planning: Goal: Ability to manage health-related needs will improve Outcome: Progressing   

## 2020-03-06 NOTE — Progress Notes (Signed)
Patient admitted from ED to 6N03. Alert and oriented x4. Vital signs stable. Denies c/o pain. Oriented to room and remote. Will continue to monitor.

## 2020-03-06 NOTE — Evaluation (Signed)
Physical Therapy Evaluation Patient Details Name: Corey Oneill MRN: [id number] DOB: 1936/06/01 Today's Date: 03/06/2020   History of Present Illness  Corey Oneill is a 84 y.o. 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     Clinical Impression  Patient presented sitting in bed, awake, and willing to participate in therapy. PTA, pt was receiving rehab and care at a SNF, he was ambulating with a RW limited-distances, likely requiring assist- assuming he was needing assist with all ADL's. Pt easily distracted and unable to provide clear information about PTA. At the time of evaluation, pt required min-modA for sit<>stand from EOB, minA to power up, and modA once standing to maintain balance with RW as pt w/tendency to lean posteriorly. ModA+2 to pivot to chair to maintain balance and manage RW. Hopeful to progress mobility and activity tolerance with improved energy level. Recommend d/c to SNF for further intensive therapies to maximize functional mobility, safety, and independence. Pt would continue to benefit from skilled physical therapy services at this time while admitted and after d/c to address the below listed limitations in order to improve overall safety and independence with functional mobility.      Follow Up Recommendations SNF    Equipment Recommendations  Other (comment)(defer to next venue of care)    Recommendations for Other Services OT consult     Precautions / Restrictions Precautions Precautions: Fall Restrictions Weight Bearing Restrictions: No      Mobility  Bed Mobility Overal bed mobility: Needs Assistance Bed Mobility: Supine to Sit     Supine to sit: Min assist     General bed mobility comments: minA to get  trunk upright and scoot to EOB. Pt states its more difficult for him to swing his legs off of the bed compared to his usual abilities  Transfers Overall transfer level: Needs assistance Equipment used: Rolling walker (2 wheeled) Transfers: Sit to/from [facility] Sit to Stand: Mod assist;+2 physical assistance;Min assist Stand pivot transfers: Min assist;Mod assist;+2 physical assistance       General transfer comment: minA +2 for power up from EOB, modA+2 once standing due to pt's tendency to lean posteriorly, VC to shift weight anteriorly. ModA+2 to pivot to chair, assist for balance and RW management  Ambulation/Gait                Stairs            Wheelchair Mobility    Modified Rankin (Stroke Patients Only)       Balance Overall balance assessment: Needs assistance Sitting-balance support: Bilateral upper extremity supported;Feet supported Sitting balance-Leahy Scale: Poor Sitting balance - Comments: initially required minA but able to progres to sitting without support w/bil UE support Postural control: Posterior lean Standing balance support: Bilateral upper extremity supported;During functional activity Standing balance-Leahy Scale: Poor Standing balance comment: pt tendency to lean posteriorly, requiring modA to maintain standing balance                             Pertinent Vitals/Pain Pain Assessment: Faces Faces Pain Scale: No hurt    Home Living Family/patient expects to be discharged to:: Skilled nursing facility  Additional Comments: Has been in/out of rehab since Oct 2020 (mostly in)    Prior Function Level of Independence: Needs assistance   Gait / Transfers Assistance Needed: uses walker           Hand Dominance   Dominant Hand: Right    Extremity/Trunk Assessment   Upper Extremity Assessment Upper Extremity Assessment: Generalized weakness    Lower Extremity  Assessment Lower Extremity Assessment: Generalized weakness    Cervical / Trunk Assessment Cervical / Trunk Assessment: Kyphotic  Communication   Communication: HOH  Cognition Arousal/Alertness: Awake/alert Behavior During Therapy: WFL for tasks assessed/performed Overall Cognitive Status: Impaired/Different from baseline Area of Impairment: Attention;Memory                   Current Attention Level: Sustained Memory: Decreased short-term memory         General Comments: Pt easily distracted, asking about the weather outside and jumping conversation topics. He is oriented to self, month, and day of the week. He had difficulty explaining his living situation      General Comments      Exercises     Assessment/Plan    PT Assessment Patient needs continued PT services  PT Problem List Decreased strength;Decreased range of motion;Decreased activity tolerance;Decreased balance;Decreased mobility;Decreased coordination;Decreased cognition;Decreased knowledge of use of DME;Decreased safety awareness;Decreased knowledge of precautions       PT Treatment Interventions DME instruction;Gait training;Stair training;Functional mobility training;Therapeutic activities;Therapeutic exercise;Balance training;Neuromuscular re-education;Cognitive remediation;Patient/family education    PT Goals (Current goals can be found in the Care Plan section)  Acute Rehab PT Goals PT Goal Formulation: With patient Time For Goal Achievement: 03/20/20 Potential to Achieve Goals: Fair    Frequency Min 2X/week   Barriers to discharge        Co-evaluation               AM-PAC PT "6 Clicks" Mobility  Outcome Measure Help needed turning from your back to your side while in a flat bed without using bedrails?: None Help needed moving from lying on your back to sitting on the side of a flat bed without using bedrails?: A Little Help needed moving to and from a bed to a chair (including a  wheelchair)?: A Lot Help needed standing up from a chair using your arms (e.g., wheelchair or bedside chair)?: A Little Help needed to walk in hospital room?: A Lot Help needed climbing 3-5 steps with a railing? : Total 6 Click Score: 15    End of Session Equipment Utilized During Treatment: Gait belt Activity Tolerance: Patient tolerated treatment well Patient left: in chair;with call bell/phone within reach;with chair alarm set Nurse Communication: Mobility status PT Visit Diagnosis: Unsteadiness on feet (R26.81);Other abnormalities of gait and mobility (R26.89);History of falling (Z91.81);Muscle weakness (generalized) (M62.81);Adult, failure to thrive (R62.7)    Time: 0923-0949 PT Time Calculation (min) (ACUTE ONLY): 26 min   Charges:   PT Evaluation $PT Eval Moderate Complexity: 1 Mod PT Treatments $Therapeutic Activity: 8-22 mins        Kimi Kroft, SPT Acute Rehab  PT:[id number]   Aubery Douthat 03/06/2020, 10:48 AM

## 2020-03-06 NOTE — Progress Notes (Addendum)
Ship broker received referral for pt to transfer to [facility] for EOL care. Pt's chart currently in review by hospice staff to determine eligibility for [facility].     Writer called pt's daughter, [name], to confirm interest with no answer. VM left.   [facility] does not have bed availability today.    Please do not hesitate to outreach with any questions and thank you for the referral.  1515 addendum: Liaison spoke with daughter, [name], who agrees to [facility] placement. Liaison will let family/toc know re: eligibility as soon as it is determined.    Freddie Breech, RN  [facility] Liaison  [phone]

## 2020-03-06 NOTE — Consult Note (Signed)
Palliative Medicine Inpatient Consult Note  Reason for consult:  Address goals of care   Palliative care consult received.  Chart reviewed including personal review of pertinent labs and imaging.  HPI:  Per intake H&P --> Corey Oneill is a 84 y.o. 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   Palliative care was asked to consult in the setting of metastatic disease (prostate primary) and failure to thrive.  Clinical Assessment/Goals of Care: I have reviewed medical records including EPIC notes, labs and imaging, received report from bedside RN, assessed the patient.    I met with Corey Oneill and called his daughter, Corey Oneill to further discuss diagnosis prognosis, GOC, EOL wishes, disposition and options.   I introduced Palliative Medicine as specialized medical care for people living with serious illness. It focuses on providing relief from the symptoms and stress of a serious illness. The goal is to improve quality of life for both the patient and the family. Corey Oneill shares that she is familiar with Palliative care and hospice as Corey Oneill was enrolled in hospice through [facility] for a short time. She realized he could not receive PT/OT on their services therefore he was disenrolled.   I asked Corey Oneill to tell me a little about himself. He shares that he is from [place] originally. He lived in [place] for some time then moved to [place] forty years ago in the setting of job. He worked as a Orthoptist for [employer], he shares that he enjoyed his job very much. He is married to his wife, Corey Oneill who he has been with for the last forty-five years. They share two daughters together, Corey Oneill and Corey Oneill.  Corey Oneill has three children who Corey Oneill says bring him great happiness. He shares that he is a religious man though does not mention an specific denomination.  I asked Corey Oneill to tell me how his health had been over the last year or so. He said that it has been deteriorating. He says that he feels weak overall and has no desire to eat anymore. He feels that his rapid decline has been evident since October of this past year. He has not been doing well and has been in and out of rehabilitation. I asked him if he feels that his time is short and he shared that he does not know. He is very apathetic during our time together. We discussed whether or not he has been feeling depression and he shares that he has. He said that there are some personal family issues that are weighing heavily on his mind. Allowed him time to express his feelings. Utilized therapeutic listening as a form of emotional support.   A detailed discussion was had today regarding advanced directives, Corey Oneill states that he would wish for both his wife, Corey Oneill and his daughter, Corey Oneill to make decisions on his behalf if he were in a position whereby he was unable to  Concepts specific to code status, artifical feeding and hydration, continued IV antibiotics and rehospitalization was had. A MOST form was introduced and each category reviewed in great detail. Corey Oneill has chosen to be DNR with limited interventions. He was unsure of tube feedings though I was able to discuss this with Corey Oneill who agreed that introducing tube feedings would not  solve his underlying problems or be advisable in the setting of prior aspiration pneumonias.    The difference between a aggressive medical intervention path  and a palliative comfort care path for this patient at this time was had. The topic of hospice was again introduce. We plan to have a family meeting this afternoon to discuss this in greater detail.   Discussed the importance of continued conversation  with family and their  medical providers regarding overall plan of care and treatment options, ensuring decisions are within the context of the patients values and GOCs.  SUMMARY OF RECOMMENDATIONS: DNAR/DNI  Family meeting this afternoon  Chaplain consult  TOC --> Referral to [facility] (patient had been enrolled prior)  Code Status/Advance Care Planning: DNAR/DNI   Symptom Management:  Failure to Thrive:  - Dietician involved  - Encourage 1:1 feeding  - Supplemental nutrients  - Receiving mIVF  Depression:  - Start mirtazapine 7m PO QHS  Constipation:                 - Miralax 17g PO Qday  - Senna 1 Tab PO Qday   Muscular Weakness:                 - Physical Therapy Evaluation                 - Occupational Therapy Evaluation   Spiritual:                 - Chaplain consult  Need for emotional support:  - Utilize therapeutic listening  - Ensure safe space to express concerns  - Sit next to patient/family making eye contact  Palliative Prophylaxis:  Aspiration precautions  Additional Recommendations (Limitations, Scope, Preferences): Treat the treatable   Psycho-social/Spiritual:   Desire for further Chaplaincy support: Yes  Additional Recommendations: Caregiver support; Information on hospice   Prognosis:   < 6 months in the setting of frailty, acute weight loss   Discharge Planning:   Unclear [facility]  This conversation/these recommendations were discussed with patient primary care team, Dr. PLouanne Belton Time In: 0800 Time Out: 1000 Total Time: 120 Greater than 50%  of this time was spent counseling and coordinating care related to the above assessment and plan.  [place] Cell Phone: [phone] utilize secure chat with additional questions, if there is no response within 30 minutes please call the above phone  number  Palliative Medicine Team providers are available by phone from 7am to 7pm daily and can be reached through the team cell phone.  Should this patient require assistance outside of these hours, please call the patient's attending physician.

## 2020-03-06 NOTE — Progress Notes (Signed)
Palliative Medicine Inpatient Follow Up Note  Family meeting:  Participants: Corey Oneill, Corey Oneill, and Corey Oneill  I met with Corey Oneill, his wife, and his daughters at bedside. We discussed his present health state and the concern that his weakness and failure to thrive are all in the setting of his advanced prostate cancer. He shares that he just does not feel any desire to eat. We reviewed that when a disease has escalated it often can result in lack of hunger. We reviewed his clinical course. I shared with him that at this juncture hospice would be a reasonable consideration. We discussed how hospice is a focus on dignity and quality at the end of life. We reviewed the processes that occur as our bodies shut down during our final phases. I provided his family with a copy of the "Gone From My Site" booklet.  We discussed the various routes we could take in terms of him either going home with hospice, to SNF with hospice, or to a residential hospice facility. His family determined that they would not be able to facilitate the care he may need if he were to go home (to [facility]). They shared that SNF with hospice would be less likely given the daily co-payment. We determined that likely a residential hospice would best suite Corey Oneill needs. I shared that his time would be short which they are all aware of.   We discussed goals for Corey Oneill which include him writing to his friends who he has acquired over the years from the [employer]. He and his family plan to type letters to send these friends. Corey Oneill talked about spending time with his family, focusing on these moments.   We talked about transition to comfort measures in house and what that would entail inclusive of medications to control pain, dyspnea, agitation, nausea, itching, and hiccups.    Again, reviewed what that would look like in the hospital inclusive of stopping all uneccessary measures such as blood draws, needle sticks,  and frequent vital signs.   Utilized reflective listening throughout our time together.   SUMMARY OF RECOMMENDATIONS: DNAR/DNI  Comfort Measures  Chaplain consult  TOC --> Referral to [facility]. Likely no beds until Monday  Code Status/Advance Care Planning: DNAR/DNI  Symptom Management: Dyspnea: Pain:  - Morphine 2.44m PO/SL Q2H PRN  - Morphine 153mIV Q2H PRN Fever:  - Tylenol 65090mO/PR Q6H PRN Agitation: Anxiety:  - Lorazepam 0.5-1mg29m  Q2H PRN Nausea:  - Zofran 4mg 87mQ6H PRN  Secretions:  - Glycopyrrolate 0.4mg I29m4H PRN Dry Eyes:  - Artificial Tears PRN Xerostomia:  - Biotene 15ml P60m- BID oral care Urinary Retention:  - Bladder Scan Qshift if > 250ml pl22mand maintain foley cether Constipation:  - Bisacodyl 10mg PR 50mQDay Spiritual:  - Chaplain consult  Time In: 1230 Time Out: 1342 Total Time: 62 Greater than 50%  of this time was spent counseling and coordinating care related to the above assessment and plan.  Corey Oneill [place] Cell Phone: [phone] secure chat with additional questions, if there is no response within 30 minutes please call the above phone number  Palliative Medicine Team providers are available by phone from 7am to 7pm daily and can be reached through the team cell phone.  Should this patient require assistance outside of these hours, please call the patient's attending physician.

## 2020-03-06 NOTE — Progress Notes (Signed)
Patient too sleepy to sign obs letter. Attempted to call wife. Left message.

## 2020-03-06 NOTE — Progress Notes (Signed)
PROGRESS NOTE  Corey Oneill [id number] DOB: Dec 02, 1935 DOA: 03/05/2020 PCP: [name], MD   LOS: 0 days   Brief narrative: As per HPI,  Corey Oneill is a 84 y.o. 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  Patient denied impaired appetite and decreased urinary output.Patient was recently admitted in February for aspiration pneumonia and was discharged to Skilled nursing facility.  In the ED patient was afebrile, mildly tachycardic and normotensive.  WBC of 6.7, hemoglobin of 11.8 from baseline of around 9-10 and appears heme-concentrated. Platelet stable at 97. Na of 130, glucose of 103, anion gap of 16. CXR was negative.   Patient was then placed in observation for failure to thrive.  Assessment/Plan:  Principal Problem:   Failure to thrive in adult Active Problems:   S/P TAVR (transcatheter aortic valve replacement)   History of ITP   Metastasis from malignant neoplasm of prostate (HCC)   Bullous pemphigoid   Pressure injury of skin   Seizures (HCC)   Prolonged QT interval   Palliative care by specialist   Goals of care, counseling/discussion   DNR (do not resuscitate)   Failure to thrive, severe protein calorie malnutrition present on admission. Very likely secondary to metastatic prostate cancer. Will try remeron for palliative.  Nutritional support.  Patient does not have any appetite.  Poor prognosis.  Mild depression.  We will try Remeron which could also help with appetite.  Volume depletion continue on IV fluids.  Reassess in a.m. still appears volume depleted.  Continue with IV  hydration.  Mild hyponatremia.  Improved with IV fluids.  Check BMP in a.m.  Anion gap metabolic acidosis.  Likely secondary to poor oral intake/starvation ketosis.  Continue IV fluid hydration.  Check BMP in a.m.  Prolonged QTc QTc of 507 on presentation.Avoid QT prolongation meds.  We will continue to monitor electrolytes and replenish aggressively.  Telemetry monitor.  Give 1 dose of KCl 40 mEq and start on p.o. magnesium oxide   Prostate cancer with metastasis to bone On Xtandi. Patient follows up with  oncology as outpatient.  Bullous Pemphigoid  On Methotrexate and doxycycline.  Patient follows with dermatology at [facility] We will monitor platelets.  Platelet of 95 today.  Check CBC in a.m.  CAD s/p stent   No active chest pain.  Aortic valve disease s/p TAVR Pt developed enterococcal bacteremia in 09/2019 but prosthetic valve was unable to be assessed on TEE due to large hiatal hernia and he was started on IV antibiotics and transitioned to chronic suppressive antibiotics.   Currently on amoxicillin.  We will continue with that.  History of seizures Continue Lamictal  Stage 1 sacral ulcer Present on admission.  Continue wound care   Goals of care.  Patient appears to have a severe failure to thrive, severe protein calorie malnutrition and advanced metastatic cancer.  Spoke with palliative care at bedside.  Palliative care did have a discussion with the family today.  Generalized debility, deconditioning.  Will get PT, OT evaluation.  DVT prophylaxis:.Lovenox subcu  Code Status: DNR  Family Communication: Palliative care to discuss with the family today.  Consults  Palliative care  Status is: Observation  The patient will require care spanning > 2 midnights and should be moved to inpatient because: Unsafe d/c plan and Formulating goals of care including palliative care/hospice discussion.  Dispo: The patient is from: SNF              Anticipated d/c is  to: Skilled nursing facility/hospice care              Anticipated d/c date is: 2 days              Patient currently is not medically stable to d/c.  Procedures:  None  Antibiotics:  . Doxycycline and amoxicillin  Anti-infectives (From admission, onward)   Start     Dose/Rate Route Frequency Ordered Stop   03/06/20 1000  doxycycline (VIBRA-TABS) tablet 100 mg     100 mg Oral 2 times daily 03/06/20 0025     03/06/20 1000  amoxicillin (AMOXIL) capsule 500 mg     500 mg Oral 2 times daily 03/06/20 0025        Subjective: Today, patient was seen and examined at bedside.  Patient states that he does not have any appetite.  Denies any cough, fever, chills or rigor.  Denies any nausea, vomiting or abdominal pain.  Patient feels slightly depressed.  Objective: Vitals:   03/06/20 0434 03/06/20 1041  BP: 122/80 105/67  Pulse: 92 (!) 102  Resp: 17 20  Temp: 98.7 F (37.1 C) 97.7 F (36.5 C)  SpO2: 97% 95%    Intake/Output Summary (Last 24 hours) at 03/06/2020 1158 Last data filed at 03/06/2020 0645 Gross per 24 hour  Intake 52.53 ml  Output 150 ml  Net -97.47 ml   Filed Weights   04/03/2020 1830 03/06/20 0000  Weight: 52.6 kg 50.3 kg   Body mass index is 15.91 kg/m.   Physical Exam: GENERAL: Patient is alert awake and oriented. Not in obvious distress.  Mildly depressed mood, cachectic, appears frail and deconditioned HENT: No scleral pallor or icterus. Pupils equally reactive to light. Oral mucosa is dry NECK: is supple, no gross swelling noted. CHEST: Clear to auscultation. No crackles or wheezes.  Diminished breath sounds bilaterally. CVS: S1 and S2 heard, no murmur. Regular rate and rhythm.  ABDOMEN: Soft, non-tender, bowel sounds are present. EXTREMITIES: No edema. CNS: Cranial nerves are intact. No focal motor deficits. SKIN: warm and dry without rashes.  Data Review: I have personally reviewed the following laboratory data and studies,  CBC: Recent Labs  Lab  03-Apr-2020 1829 03/06/20 0935  WBC 6.7 8.2  NEUTROABS 5.4  --   HGB 11.8* 10.3*  HCT 35.2* 30.5*  MCV 99.7 98.4  PLT 97* 95*   Basic Metabolic Panel: Recent Labs  Lab 04/03/2020 1829 03/06/20 0935  NA 130* 134*  K 3.6 3.6  CL 92* 97*  CO2 22 21*  GLUCOSE 103* 92  BUN 12 9  CREATININE 0.91 0.68  CALCIUM 8.9 8.7*  MG 1.9  --    Liver Function Tests: Recent Labs  Lab 04/03/20 1829  AST 17  ALT 6  ALKPHOS 101  BILITOT 1.1  PROT 7.2  ALBUMIN 3.2*   No results for input(s): LIPASE, AMYLASE in the last 168 hours. No results for input(s): AMMONIA in the last 168 hours. Cardiac Enzymes: No results for input(s): CKTOTAL, CKMB, CKMBINDEX, TROPONINI in the last 168 hours. BNP (last 3 results) No results for input(s): BNP in the last 8760 hours.  ProBNP (  last 3 results) No results for input(s): PROBNP in the last 8760 hours.  CBG: No results for input(s): GLUCAP in the last 168 hours. Recent Results (from the past 240 hour(s))  Respiratory Panel by RT PCR (Flu A&B, Covid) - Nasopharyngeal Swab     Status: None   Collection Time: 03/05/20 10:50 PM   Specimen: Nasopharyngeal Swab  Result Value Ref Range Status   SARS Coronavirus 2 by RT PCR NEGATIVE NEGATIVE Final    Comment: (NOTE) SARS-CoV-2 target nucleic acids are NOT DETECTED. The SARS-CoV-2 RNA is generally detectable in upper respiratoy specimens during the acute phase of infection. The lowest concentration of SARS-CoV-2 viral copies this assay can detect is 131 copies/mL. A negative result does not preclude SARS-Cov-2 infection and should not be used as the sole basis for treatment or other patient management decisions. A negative result may occur with  improper specimen collection/handling, submission of specimen other than nasopharyngeal swab, presence of viral mutation(s) within the areas targeted by this assay, and inadequate number of viral copies (<131 copies/mL). A negative result must be combined with  clinical observations, patient history, and epidemiological information. The expected result is Negative. Fact Sheet for Patients:  PinkCheek.be Fact Sheet for Healthcare Providers:  GravelBags.it This test is not yet ap proved or cleared by the [place] FDA and  has been authorized for detection and/or diagnosis of SARS-CoV-2 by FDA under an Emergency Use Authorization (EUA). This EUA will remain  in effect (meaning this test can be used) for the duration of the COVID-19 declaration under Section 564(b)(1) of the Act, 21 U.S.C. section 360bbb-3(b)(1), unless the authorization is terminated or revoked sooner.    Influenza A by PCR NEGATIVE NEGATIVE Final   Influenza B by PCR NEGATIVE NEGATIVE Final    Comment: (NOTE) The Xpert Xpress SARS-CoV-2/FLU/RSV assay is intended as an aid in  the diagnosis of influenza from Nasopharyngeal swab specimens and  should not be used as a sole basis for treatment. Nasal washings and  aspirates are unacceptable for Xpert Xpress SARS-CoV-2/FLU/RSV  testing. Fact Sheet for Patients: PinkCheek.be Fact Sheet for Healthcare Providers: GravelBags.it This test is not yet approved or cleared by the [place] FDA and  has been authorized for detection and/or diagnosis of SARS-CoV-2 by  FDA under an Emergency Use Authorization (EUA). This EUA will remain  in effect (meaning this test can be used) for the duration of the  Covid-19 declaration under Section 564(b)(1) of the Act, 21  U.S.C. section 360bbb-3(b)(1), unless the authorization is  terminated or revoked. Performed at [facility], [place] [address], [place], [place] [address]   MRSA PCR Screening     Status: None   Collection Time: 03/06/20  6:45 AM   Specimen: Nasopharyngeal  Result Value Ref Range Status   MRSA by PCR NEGATIVE NEGATIVE Final    Comment:        The  GeneXpert MRSA Assay (FDA approved for NASAL specimens only), is one component of a comprehensive MRSA colonization surveillance program. It is not intended to diagnose MRSA infection nor to guide or monitor treatment for MRSA infections. Performed at [facility], [place] [address], [place], [place] [address]      Studies: DG Chest Portable 1 View  Result Date: 03/05/2020 CLINICAL DATA:  Failure to thrive. No appetite. EXAM: PORTABLE CHEST 1 VIEW COMPARISON:  Chest radiograph and CT 12/19/2019 FINDINGS: Previous right upper lobe opacity has resolved. No new airspace disease. The heart is  normal in size, post TAVR. Aortic atherosclerosis with unchanged mediastinal contours. No pleural effusion, pulmonary edema, or pneumothorax. The bones are under mineralized. Paucity of body fat. IMPRESSION: No acute chest findings. Resolved right upper lobe opacity from prior. Electronically Signed   By: Keith Rake M.D.   On: 03/05/2020 17:51      Flora Lipps, MD  [facility] 03/06/2020

## 2020-03-06 NOTE — Progress Notes (Signed)
Initial Nutrition Assessment  DOCUMENTATION CODES:   Underweight  INTERVENTION:  Provide Ensure Enlive po TID, each supplement provides 350 kcal and 20 grams of protein.  Encourage adequate PO intake.   NUTRITION DIAGNOSIS:   Increased nutrient needs related to chronic illness(cancer, heart failure) as evidenced by estimated needs.  GOAL:   Patient will meet greater than or equal to 90% of their needs  MONITOR:   PO intake, Supplement acceptance, Skin, Weight trends, Labs, I & O's  REASON FOR ASSESSMENT:   Consult Assessment of nutrition requirement/status  ASSESSMENT:   84 y.o. 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  RD working remotely.  Pt unavailable during attempted time of contact. RD unable to obtain pt nutrition history at this time. Per MD, pt presents from SNF with history of 3-4 days of weakness and poor po intake. Per weight records, pt with a 21% weight loss over the past 4 months, which is significant for time frame. Noted pt with history of heart failure. RD to order nutritional supplements to aid in caloric and protein needs.   Unable to complete Nutrition-Focused physical exam at this time.   Labs and medications reviewed.   Diet Order:   Diet Order            Diet Heart Room service appropriate? Yes; Fluid consistency: Thin  Diet effective now              EDUCATION NEEDS:   Not appropriate for education at this time  Skin:  Skin Assessment: Skin Integrity Issues: Skin Integrity Issues:: Stage I Stage I: sacral ulcer  Last BM:  4/23  Height:   Ht Readings from Last 1 Encounters:  03/06/20 5\' 10"  (1.778 m)    Weight:   Wt Readings from Last 1 Encounters:   03/06/20 50.3 kg    Ideal Body Weight:  75.45 kg  BMI:  Body mass index is 15.91 kg/m.  Estimated Nutritional Needs:   Kcal:  1800-2000  Protein:  90-100 grams  Fluid:  >/= 1.8 L/day    Corrin Parker, MS, RD, LDN RD pager number/after hours weekend pager number on Amion.

## 2020-03-07 MED ORDER — ONDANSETRON HCL 4 MG/2ML IJ SOLN: 4.0000 mg | Freq: Four times a day (QID) | INTRAMUSCULAR | Status: DC | PRN

## 2020-03-07 NOTE — Progress Notes (Signed)
PROGRESS NOTE  Corey Oneill [id number] DOB: 07/08/1936 DOA: 03/05/2020 PCP: [name], MD   LOS: 1 day   Brief narrative: As per HPI,  Corey Oneill is a 84 y.o. 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  Patient denied impaired appetite and decreased urinary output.Patient was recently admitted in February for aspiration pneumonia and was discharged to Skilled nursing facility.  In the ED patient was afebrile, mildly tachycardic and normotensive.  WBC of 6.7, hemoglobin of 11.8 from baseline of around 9-10 and appears heme-concentrated. Platelet stable at 97. Na of 130, glucose of 103, anion gap of 16. CXR was negative.   Patient was then placed in observation for failure to thrive.  Assessment/Plan:  Principal Problem:   Failure to thrive in adult Active Problems:   S/P TAVR (transcatheter aortic valve replacement)   History of ITP   Metastasis from malignant neoplasm of prostate (HCC)   Bullous pemphigoid   Pressure injury of skin   Seizures (HCC)   Prolonged QT interval   Palliative care by specialist   Goals of care, counseling/discussion   DNR (do not resuscitate)   Terminal care   Failure to thrive, severe protein calorie malnutrition present on admission. Very likely secondary to metastatic prostate cancer.  Has been started on remeron.  Continue nutritional support.  Patient does not have any appetite.  Poor prognosis.  Mild depression.  Due to family circumstances.  We will try Remeron which could also help with appetite.  Volume depletion received IV fluids.  Mild hyponatremia.   Improved with IV fluids.  No labs today  Anion gap metabolic acidosis.  Likely secondary to poor oral intake/starvation ketosis.  Continue IV fluid hydration.  Check BMP in a.m.  Prostate cancer with metastasis to bone, advanced disease Used to be on Xtandi as outpatient.  Currently on comfort level of care.  Bullous Pemphigoid  On Methotrexate and doxycycline as outpatient..  Patient used to follow-up with dermatology at [facility].  ITP  Latest platelet of 95.  CAD s/p stent   No active chest pain.  Aortic valve disease s/p TAVR Pt developed enterococcal bacteremia in 09/2019 but prosthetic valve was unable to be assessed on TEE due to large hiatal hernia and he was started on IV antibiotics and transitioned to chronic suppressive antibiotics.   Currently on comfort level of care.  Discontinue antibiotic.  History of seizures Continue Lamictal  Stage 1 sacral ulcer Present on admission.  Continue wound care   Goals of care.  Patient appears to have a severe failure to thrive, severe protein calorie malnutrition and advanced metastatic cancer.  Palliative care on board and current plan is hospice level of care.  Generalized debility, deconditioning.  Secondary to advanced cancer and failure to thrive  DVT prophylaxis:.None for comfort  Code Status: DNR  Consults  Palliative care  Status is: Inpatient  The patient will require inpatient care because: Transition to comfort care hospice, awaiting for placement  Dispo: The patient is from: SNF              Anticipated d/c is to: Residential hospice  Anticipated d/c date is: 2 days              Patient currently is medically stable to d/c.  Procedures:  None  Antibiotics:  . Doxycycline and amoxicillin> discontinued  Anti-infectives (From admission, onward)   Start     Dose/Rate Route Frequency Ordered Stop   03/06/20 1000  doxycycline (VIBRA-TABS) tablet 100 mg  Status:  Discontinued     100 mg Oral 2  times daily 03/06/20 0025 03/06/20 1419   03/06/20 1000  amoxicillin (AMOXIL) capsule 500 mg  Status:  Discontinued     500 mg Oral 2 times daily 03/06/20 0025 03/06/20 1420      Subjective: Today, patient was seen and examined at bedside.  Denies any overt pain.  Does not have much appetite.  Denies shortness of breath cough fever chills.   Objective: Vitals:   03/06/20 1356 03/07/20 0530  BP: 119/74 (!) 108/58  Pulse: (!) 102 (!) 106  Resp: 18 20  Temp: 97.7 F (36.5 C) 97.6 F (36.4 C)  SpO2: 95%     Intake/Output Summary (Last 24 hours) at 03/07/2020 1012 Last data filed at 03/06/2020 1900 Gross per 24 hour  Intake 759 ml  Output 350 ml  Net 409 ml   Filed Weights   03/05/20 1830 03/06/20 0000  Weight: 52.6 kg 50.3 kg   Body mass index is 15.91 kg/m.   Physical Exam:  GENERAL: Patient is alert awake and oriented. Not in obvious distress.  Mildly depressed mood, cachectic, appears frail and deconditioned HENT: No scleral pallor or icterus. Pupils equally reactive to light. Oral mucosa is dry NECK: is supple, no gross swelling noted. CHEST: Clear to auscultation. No crackles or wheezes.  Diminished breath sounds bilaterally. CVS: S1 and S2 heard, no murmur. Regular rate and rhythm.  ABDOMEN: Soft, non-tender, bowel sounds are present. EXTREMITIES: No edema. CNS: Cranial nerves are intact. No focal motor deficits. SKIN: warm and dry without rashes.  Decreased skin turgor  Data Review: I have personally reviewed the following laboratory data and studies,  CBC: Recent Labs  Lab 03/05/20 1829 03/06/20 0935  WBC 6.7 8.2  NEUTROABS 5.4  --   HGB 11.8* 10.3*  HCT 35.2* 30.5*  MCV 99.7 98.4  PLT 97* 95*   Basic Metabolic Panel: Recent Labs  Lab 03/05/20 1829 03/06/20 0935  NA 130* 134*  K 3.6 3.6  CL 92* 97*  CO2 22 21*  GLUCOSE 103* 92  BUN 12 9  CREATININE 0.91 0.68  CALCIUM 8.9 8.7*  MG 1.9  --    Liver Function Tests: Recent Labs  Lab  03/05/20 1829  AST 17  ALT 6  ALKPHOS 101  BILITOT 1.1  PROT 7.2  ALBUMIN 3.2*   No results for input(s): LIPASE, AMYLASE in the last 168 hours. No results for input(s): AMMONIA in the last 168 hours. Cardiac Enzymes: No results for input(s): CKTOTAL, CKMB, CKMBINDEX, TROPONINI in the last 168 hours. BNP (last 3 results) No results for input(s): BNP in the last 8760 hours.  ProBNP (last 3 results) No results for input(s): PROBNP in the last 8760 hours.  CBG: No results for input(s): GLUCAP in the last 168 hours. Recent Results (from the past 240 hour(s))  Respiratory Panel by RT PCR (Flu A&B, Covid) - Nasopharyngeal Swab     Status: None   Collection Time: 03/05/20 10:50 PM   Specimen: Nasopharyngeal Swab  Result Value Ref Range Status   SARS Coronavirus 2 by  RT PCR NEGATIVE NEGATIVE Final    Comment: (NOTE) SARS-CoV-2 target nucleic acids are NOT DETECTED. The SARS-CoV-2 RNA is generally detectable in upper respiratoy specimens during the acute phase of infection. The lowest concentration of SARS-CoV-2 viral copies this assay can detect is 131 copies/mL. A negative result does not preclude SARS-Cov-2 infection and should not be used as the sole basis for treatment or other patient management decisions. A negative result may occur with  improper specimen collection/handling, submission of specimen other than nasopharyngeal swab, presence of viral mutation(s) within the areas targeted by this assay, and inadequate number of viral copies (<131 copies/mL). A negative result must be combined with clinical observations, patient history, and epidemiological information. The expected result is Negative. Fact Sheet for Patients:  PinkCheek.be Fact Sheet for Healthcare Providers:  GravelBags.it This test is not yet ap proved or cleared by the [place] FDA and  has been authorized for detection and/or diagnosis of  SARS-CoV-2 by FDA under an Emergency Use Authorization (EUA). This EUA will remain  in effect (meaning this test can be used) for the duration of the COVID-19 declaration under Section 564(b)(1) of the Act, 21 U.S.C. section 360bbb-3(b)(1), unless the authorization is terminated or revoked sooner.    Influenza A by PCR NEGATIVE NEGATIVE Final   Influenza B by PCR NEGATIVE NEGATIVE Final    Comment: (NOTE) The Xpert Xpress SARS-CoV-2/FLU/RSV assay is intended as an aid in  the diagnosis of influenza from Nasopharyngeal swab specimens and  should not be used as a sole basis for treatment. Nasal washings and  aspirates are unacceptable for Xpert Xpress SARS-CoV-2/FLU/RSV  testing. Fact Sheet for Patients: PinkCheek.be Fact Sheet for Healthcare Providers: GravelBags.it This test is not yet approved or cleared by the [place] FDA and  has been authorized for detection and/or diagnosis of SARS-CoV-2 by  FDA under an Emergency Use Authorization (EUA). This EUA will remain  in effect (meaning this test can be used) for the duration of the  Covid-19 declaration under Section 564(b)(1) of the Act, 21  U.S.C. section 360bbb-3(b)(1), unless the authorization is  terminated or revoked. Performed at [facility], [place] [address], [place], [place] [address]   MRSA PCR Screening     Status: None   Collection Time: 03/06/20  6:45 AM   Specimen: Nasopharyngeal  Result Value Ref Range Status   MRSA by PCR NEGATIVE NEGATIVE Final    Comment:        The GeneXpert MRSA Assay (FDA approved for NASAL specimens only), is one component of a comprehensive MRSA colonization surveillance program. It is not intended to diagnose MRSA infection nor to guide or monitor treatment for MRSA infections. Performed at [facility], [place] [address], [place], [place] [address]      Studies: DG Chest Portable 1 View  Result Date:  03/05/2020 CLINICAL DATA:  Failure to thrive. No appetite. EXAM: PORTABLE CHEST 1 VIEW COMPARISON:  Chest radiograph and CT 12/19/2019 FINDINGS: Previous right upper lobe opacity has resolved. No new airspace disease. The heart is normal in size, post TAVR. Aortic atherosclerosis with unchanged mediastinal contours. No pleural effusion, pulmonary edema, or pneumothorax. The bones are under mineralized. Paucity of body fat. IMPRESSION: No acute chest findings. Resolved right upper lobe opacity from prior. Electronically Signed   By: Keith Rake M.D.   On: 03/05/2020 17:51      Flora Lipps, MD  [facility] 03/07/2020

## 2020-03-07 NOTE — Progress Notes (Signed)
[facility] Documentation  Pt has been approved for a bed at [facility]. We do not, however, have a bed, to offer today.   TOC and daughter, [name] updated.   Liaison will notify TOC and family once bed becomes available.   Please do not hesitate to outreach with any questions and thank you for the referral.   Freddie Breech, RN [facility] Liaison [phone]

## 2020-03-07 NOTE — Progress Notes (Signed)
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   Palliative care was asked to consult in the setting of metastatic disease (prostate primary) and failure to thrive.   Today's Discussion (03/07/2020): Chart reviewed. I met with Corey Oneill this morning, he appeared quite comfortable and not in any distress. Spoke to his bedside RN who asked that Zofran be made PRN from standing. She shared that it seems he had a good night without complaints.   At the present time we are awaiting evaluation from [facility] for enrollment.   Discussed the importance of continued conversation with family and their  medical providers regarding overall plan of care and treatment options, ensuring decisions are within the context of the patients values and GOCs.  Questions and concerns addressed   Vital Signs Vitals:   03/06/20 1356 03/07/20 0530  BP: 119/74 (!) 108/58  Pulse: (!) 102 (!) 106  Resp: 18 20  Temp: 97.7 F (36.5 C) 97.6 F (36.4 C)  SpO2: 95%     Intake/Output Summary (Last 24 hours) at 03/07/2020 0849 Last data filed at 03/06/2020 1900 Gross per 24 hour  Intake 819 ml  Output 550 ml  Net 269 ml   Last Weight  Most recent update: 03/06/2020 12:34 AM   Weight  50.3 kg (110 lb 14.3 oz)          SUMMARY OF RECOMMENDATIONS: DNAR/DNI  Comfort Measures  Chaplain consult  Referral to [facility]. Likely no beds until Monday  Code Status/Advance Care Planning: DNAR/DNI  Symptom Management: Dyspnea: Pain:                 - Morphine 2.61m PO/SL Q2H PRN                 - Morphine 149mIV Q2H PRN Fever:              - Tylenol 65057mO/PR Q6H PRN Agitation: Anxiety:                 - Lorazepam 0.5-1mg39m  Q2H PRN Nausea:                 - Zofran 4mg 48mQ6H PRN    Secretions:                 - Glycopyrrolate 0.4mg I3m4H PRN Dry Eyes:                 - Artificial Tears PRN Xerostomia:                 - Biotene 15ml P39m               - BID oral care Urinary Retention:                 - Bladder Scan Qshift if > 250ml pl58mand maintain foley cether Constipation:                 - Bisacodyl 10mg PR 28mQDay Spiritual:                 - Chaplain consult  Time Spent: 15 Greater  than 50% of the time was spent in counseling and coordination of care ______________________________________________________________________________________ [place] Team Team Cell Phone: [phone] Please utilize secure chat with additional questions, if there is no response within 30 minutes please call the above phone number  Palliative Medicine Team providers are available by phone from 7am to 7pm daily and can be reached through the team cell phone.  Should this patient require assistance outside of these hours, please call the patient's attending physician.

## 2020-03-08 ENCOUNTER — Ambulatory Visit: Payer: Medicare Other | Admitting: Neurology

## 2020-03-08 MED ORDER — RESOURCE THICKENUP CLEAR PO POWD
ORAL | Status: DC | PRN
  Filled 2020-03-08: qty 125

## 2020-03-08 NOTE — Progress Notes (Signed)
Pt discharged to [facility] place by [facility]   IV catheter discontinued intact. Site without signs and symptoms of complications. Dressing and pressure applied.  An After Visit Summary was printed and given to [facility].

## 2020-03-08 NOTE — Progress Notes (Signed)
RN called [phone]  Report given to Sunday Spillers, RN at [facility] awaiting transport via [employer]

## 2020-03-08 NOTE — Plan of Care (Signed)

## 2020-03-08 NOTE — Progress Notes (Signed)
Palliative Medicine RN Note: Rec'd message from [place] that pt has a bed at [facility]. Tubed DNR form to [place].  Marjie Skiff Deland Slocumb, RN, BSN, [facility] Palliative Medicine Team 03/08/2020 11:45 AM Office [phone]

## 2020-03-08 NOTE — Social Work (Addendum)
11:41pm- CSW spoke with Judeen Hammans w/ [facility]. Bed now available at [facility]. Dr. Louanne Belton notifed, PMT also notified.   10:16am- Pt has been approved for a bed at [facility]. Per liaison Judeen Hammans they do not have a bed to offer today.   Westley Hummer, MSW, [place] [facility]

## 2020-03-08 NOTE — Progress Notes (Signed)
PROGRESS NOTE  Corey Oneill [id number] DOB: 1936-06-23 DOA: 03/05/2020 PCP: [name], MD   LOS: 2 days   Brief narrative: As per HPI,  Corey Oneill is a 84 y.o. 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  Patient denied impaired appetite and decreased urinary output. Patient was recently admitted in February for aspiration pneumonia and was discharged to Skilled nursing facility.  In the ED patient was afebrile, mildly tachycardic and normotensive.  WBC of 6.7, hemoglobin of 11.8 from baseline of around 9-10 and appears heme-concentrated. Platelet stable at 97. Na of 130, glucose of 103, anion gap of 16. CXR was negative.   Patient was then admitted to the hospital for failure to thrive.  Assessment/Plan:  Principal Problem:   Failure to thrive in adult Active Problems:   S/P TAVR (transcatheter aortic valve replacement)   History of ITP   Metastasis from malignant neoplasm of prostate (HCC)   Bullous pemphigoid   Pressure injury of skin   Seizures (HCC)   Prolonged QT interval   Palliative care by specialist   Goals of care, counseling/discussion   DNR (do not resuscitate)   Terminal care   Failure to thrive, severe protein calorie malnutrition present on admission. secondary to metastatic prostate cancer.  Empirically was started on remeron.  Continue nutritional support.  Patient does not have any appetite.  Poor prognosis.  Mild depression.  Due to family circumstances.  On Remeron  Volume depletion received IV fluids.  Mild hyponatremia.  Improved with IV fluids.  No labs  today  Anion gap metabolic acidosis.  Likely secondary to poor oral intake/starvation ketosis.  received IV fluid hydration.  Check BMP in a.m.  Prostate cancer with metastasis to bone, advanced disease Used to be on Xtandi as outpatient.  Currently on comfort level of care.  Bullous Pemphigoid  On Methotrexate and doxycycline as outpatient..  Patient used to follow-up with dermatology at [facility].  ITP   Latest platelet of 95.  CAD s/p stent   No active chest pain.  Aortic valve disease s/p TAVR Pt developed enterococcal bacteremia in 09/2019 but prosthetic valve was unable to be assessed on TEE due to large hiatal hernia and he was started on IV antibiotics and transitioned to chronic suppressive antibiotics.   Currently on comfort level of care.  Off antibiotics.  History of seizures Continue Lamictal  Stage 1 sacral ulcer Present on admission.  Continue wound care   Goals of care.  Patient appears to have a severe failure to thrive, severe protein calorie malnutrition and advanced metastatic cancer.  Palliative care on board and current plan is hospice level of care.  Generalized debility, deconditioning.  Secondary to advanced cancer and failure to thrive  DVT prophylaxis:.None for comfort  Code Status: DNR  Consults  Palliative care  Status is: Inpatient  The patient will require inpatient care because: Transition to comfort care hospice, awaiting for placement  Dispo: The patient is from: SNF              Anticipated d/c is to: Residential hospice  Anticipated d/c date is: 2 days              Patient currently is medically stable to d/c.  Procedures:  None  Antibiotics:  . Doxycycline and amoxicillin> discontinued  Anti-infectives (From admission, onward)   Start     Dose/Rate Route Frequency Ordered Stop   03/06/20 1000  doxycycline (VIBRA-TABS) tablet 100 mg  Status:  Discontinued     100 mg Oral 2 times daily 03/06/20 0025 03/06/20 1419    03/06/20 1000  amoxicillin (AMOXIL) capsule 500 mg  Status:  Discontinued     500 mg Oral 2 times daily 03/06/20 0025 03/06/20 1420      Subjective: Today, patient was seen and examined at bedside.  Has mild back pain which is chronic.  Complains of mild cough especially on eating.  Objective: Vitals:   03/07/20 0530 03/08/20 0354  BP: (!) 108/58 (!) 116/94  Pulse: (!) 106 (!) 55  Resp: 20 14  Temp: 97.6 F (36.4 C) 98 F (36.7 C)  SpO2:  91%    Intake/Output Summary (Last 24 hours) at 03/08/2020 1119 Last data filed at 03/08/2020 0930 Gross per 24 hour  Intake 120 ml  Output --  Net 120 ml   Filed Weights   03/05/20 1830 03/06/20 0000  Weight: 52.6 kg 50.3 kg   Body mass index is 15.91 kg/m.   Physical Exam:  GENERAL: Patient is alert awake and communicative,. Not in obvious distress.  Mildly depressed mood, cachectic, appears frail and deconditioned.  Moist cough HENT: No scleral pallor or icterus. Pupils equally reactive to light. Oral mucosa is dry NECK: is supple, no gross swelling noted. CHEST:   Diminished breath sounds bilaterally. CVS: S1 and S2 heard, no murmur. Regular rate and rhythm.  ABDOMEN: Soft, non-tender, bowel sounds are present. EXTREMITIES: No edema. CNS: Cranial nerves are intact.  Moving extremities. SKIN: warm and dry without rashes.  Decreased skin turgor  Data Review: I have personally reviewed the following laboratory data and studies,  CBC: Recent Labs  Lab 03/05/20 1829 03/06/20 0935  WBC 6.7 8.2  NEUTROABS 5.4  --   HGB 11.8* 10.3*  HCT 35.2* 30.5*  MCV 99.7 98.4  PLT 97* 95*   Basic Metabolic Panel: Recent Labs  Lab 03/05/20 1829 03/06/20 0935  NA 130* 134*  K 3.6 3.6  CL 92* 97*  CO2 22 21*  GLUCOSE 103* 92  BUN 12 9  CREATININE 0.91 0.68  CALCIUM 8.9 8.7*  MG 1.9  --    Liver Function Tests: Recent Labs  Lab 03/05/20 1829  AST 17  ALT 6  ALKPHOS 101  BILITOT 1.1  PROT 7.2  ALBUMIN 3.2*   No  results for input(s): LIPASE, AMYLASE in the last 168 hours. No results for input(s): AMMONIA in the last 168 hours. Cardiac Enzymes: No results for input(s): CKTOTAL, CKMB, CKMBINDEX, TROPONINI in the last 168 hours. BNP (last 3 results) No results for input(s): BNP in the last 8760 hours.  ProBNP (last 3 results) No results for input(s): PROBNP in the last 8760 hours.  CBG: No results for input(s): GLUCAP in the last 168 hours. Recent Results (from the past 240 hour(s))  Respiratory Panel by RT PCR (Flu A&B, Covid) - Nasopharyngeal Swab     Status: None   Collection Time: 03/05/20 10:50 PM   Specimen: Nasopharyngeal Swab  Result Value Ref Range Status   SARS Coronavirus 2 by RT PCR NEGATIVE NEGATIVE Final  Comment: (NOTE) SARS-CoV-2 target nucleic acids are NOT DETECTED. The SARS-CoV-2 RNA is generally detectable in upper respiratoy specimens during the acute phase of infection. The lowest concentration of SARS-CoV-2 viral copies this assay can detect is 131 copies/mL. A negative result does not preclude SARS-Cov-2 infection and should not be used as the sole basis for treatment or other patient management decisions. A negative result may occur with  improper specimen collection/handling, submission of specimen other than nasopharyngeal swab, presence of viral mutation(s) within the areas targeted by this assay, and inadequate number of viral copies (<131 copies/mL). A negative result must be combined with clinical observations, patient history, and epidemiological information. The expected result is Negative. Fact Sheet for Patients:  PinkCheek.be Fact Sheet for Healthcare Providers:  GravelBags.it This test is not yet ap proved or cleared by the [place] FDA and  has been authorized for detection and/or diagnosis of SARS-CoV-2 by FDA under an Emergency Use Authorization (EUA). This EUA will remain  in effect  (meaning this test can be used) for the duration of the COVID-19 declaration under Section 564(b)(1) of the Act, 21 U.S.C. section 360bbb-3(b)(1), unless the authorization is terminated or revoked sooner.    Influenza A by PCR NEGATIVE NEGATIVE Final   Influenza B by PCR NEGATIVE NEGATIVE Final    Comment: (NOTE) The Xpert Xpress SARS-CoV-2/FLU/RSV assay is intended as an aid in  the diagnosis of influenza from Nasopharyngeal swab specimens and  should not be used as a sole basis for treatment. Nasal washings and  aspirates are unacceptable for Xpert Xpress SARS-CoV-2/FLU/RSV  testing. Fact Sheet for Patients: PinkCheek.be Fact Sheet for Healthcare Providers: GravelBags.it This test is not yet approved or cleared by the [place] FDA and  has been authorized for detection and/or diagnosis of SARS-CoV-2 by  FDA under an Emergency Use Authorization (EUA). This EUA will remain  in effect (meaning this test can be used) for the duration of the  Covid-19 declaration under Section 564(b)(1) of the Act, 21  U.S.C. section 360bbb-3(b)(1), unless the authorization is  terminated or revoked. Performed at [facility], [place] [address], [place], [place] [address]   MRSA PCR Screening     Status: None   Collection Time: 03/06/20  6:45 AM   Specimen: Nasopharyngeal  Result Value Ref Range Status   MRSA by PCR NEGATIVE NEGATIVE Final    Comment:        The GeneXpert MRSA Assay (FDA approved for NASAL specimens only), is one component of a comprehensive MRSA colonization surveillance program. It is not intended to diagnose MRSA infection nor to guide or monitor treatment for MRSA infections. Performed at [facility], [place] [address] [place], [place] [address]      Studies: No results found.    Flora Lipps, MD  [facility] 03/08/2020

## 2020-03-08 NOTE — Social Work (Signed)
Clinical Social Worker facilitated patient discharge including contacting patient family and facility to confirm patient discharge plans.  Clinical information faxed to facility and family agreeable with plan.  CSW arranged ambulance transport via [facility] to [facility].  RN to call [phone] with report prior to discharge.  Clinical Social Worker will sign off for now as social work intervention is no longer needed. Please consult us again if new need arises.  Annina Piotrowski, MSW, LCSW Clinical Social Worker   

## 2020-03-08 NOTE — Discharge Summary (Signed)
Physician Discharge Summary  Corey Oneill [id number] DOB: 02/28/1936 DOA: 03/05/2020  PCP: [name], MD  Admit date: 03/05/2020 Discharge date: 03/08/2020  Admitted From: Home  Discharge disposition: residential hospice   Recommendations for Outpatient Follow-Up:   Follow up plan as per residential hospice  Discharge Diagnosis:   Principal Problem:   Failure to thrive in adult Active Problems:   S/P TAVR (transcatheter aortic valve replacement)   History of ITP   Metastasis from malignant neoplasm of prostate (HCC)   Bullous pemphigoid   Pressure injury of skin   Seizures ([place])   Prolonged QT interval   Palliative care by specialist   Goals of care, counseling/discussion   DNR (do not resuscitate)   Terminal care   Discharge Condition: Improved.  Diet recommendation: Dysphagia 1 diet  Wound care: None.  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  Patient denied impaired appetite and decreased urinary output. Patient was recently admitted in February for aspiration pneumonia and was discharged to Skilled nursing facility.  In the ED patient was afebrile, mildly tachycardic and normotensive.  WBC of 6.7, hemoglobin of 11.8 from baseline of around 9-10 and appears heme-concentrated. Platelet stable at 97. Na of 130, glucose of 103, anion gap of 16. CXR was negative.  Patient was then admitted to the hospital for failure to thrive.  Hospital Course:   Following conditions were addressed during hospitalization  as listed below,  Failure to thrive, severe protein calorie malnutrition present on admission. secondary to metastatic prostate cancer.  Empirically was started on remeron during hospitalization..  Continue nutritional support.  Patient does not have any appetite.  Poor prognosis.  Mild to moderate depression.  Likely contributing to failure to thrive.  Volume depletion received IV fluids.  Mild hyponatremia.  Improved with IV fluids.  No labs today  Anion gap metabolic acidosis.  Likely secondary to poor oral intake/starvation ketosis.  received IV fluid hydration.  Check BMP in a.m.  Prostate cancer with metastasis to bone, advanced disease Used to be on Xtandi as outpatient.  Currently on comfort level of care.  Bullous Pemphigoid On Methotrexateand doxycycline as outpatient..  Patient used to follow-up with dermatology at [facility].  ITP   Latest platelet of 95.  CAD s/p stent  No active chest pain.  Aortic valve disease s/p TAVR Pt developed enterococcal bacteremia in 09/2019 but prosthetic valve was unable to be assessed on TEE due to large hiatal hernia and he was started on IV antibiotics and transitioned to chronic suppressive antibiotics.  Currently on comfort level of care.  Off antibiotics.  History of seizures Continue Lamictal  Stage 1 sacral ulcer Present on admission.  Continue wound care   Goals of care.  Patient appears to have a severe failure to thrive, severe protein calorie malnutrition and advanced metastatic cancer.  Palliative care on board and current plan is hospice level of care.  Generalized debility, deconditioning.  Secondary to advanced cancer and failure to thrive  Disposition.  At this time, patient is stable for disposition to residential hospice for comfort care.  Medical Consultants:    Palliative care  Procedures:    None Subjective:   Today, patient complains of back pain, nursing staff reported some difficulty  swallowing.  Discharge Exam:   Vitals:   03/07/20 0530 03/08/20 0354  BP: (!) 108/58 (!) 116/94  Pulse: (!) 106 (!) 55  Resp: 20 14  Temp: 97.6 F (36.4 C) 98 F (36.7 C)  SpO2:  91%   Vitals:   03/06/20 1041 03/06/20 1356 03/07/20 0530 03/08/20 0354  BP: 105/67 119/74 (!) 108/58 (!) 116/94  Pulse: (!) 102 (!) 102 (!) 106 (!) 55  Resp: 20 18 20 14   Temp: 97.7 F (36.5 C) 97.7 F (36.5 C) 97.6 F (36.4 C) 98 F (36.7 C)  TempSrc: Oral Oral Oral Oral  SpO2: 95% 95%  91%  Weight:      Height:        GENERAL: Patient is alert awake and communicative,. Not in obvious distress.  Mildly depressed mood, cachectic, appears frail and deconditioned.  Moist cough HENT: No scleral pallor or icterus. Pupils equally reactive to light. Oral mucosa is dry NECK: is supple, no gross swelling noted. CHEST:   Diminished breath sounds bilaterally. CVS: S1 and S2 heard, no murmur. Regular rate and rhythm.  ABDOMEN: Soft, non-tender, bowel sounds are present. EXTREMITIES: No edema. CNS: Cranial nerves are intact.  Moving extremities. SKIN: warm and dry without rashes.  Decreased skin turgor  The results of significant diagnostics from this hospitalization (including imaging, microbiology, ancillary and laboratory) are listed below for reference.     Diagnostic Studies:   DG Chest Portable 1 View  Result Date: 03/05/2020 CLINICAL DATA:  Failure to thrive. No appetite. EXAM: PORTABLE CHEST 1 VIEW COMPARISON:  Chest radiograph and CT 12/19/2019 FINDINGS: Previous right upper lobe opacity has resolved. No new airspace disease. The heart is normal in size, post TAVR. Aortic atherosclerosis with unchanged mediastinal contours. No pleural effusion, pulmonary edema, or pneumothorax. The bones are under mineralized. Paucity of body fat. IMPRESSION: No acute chest findings. Resolved right upper lobe opacity from prior. Electronically Signed   By: Keith Rake M.D.   On: 03/05/2020 17:51      Labs:   Basic Metabolic Panel: Recent Labs  Lab 03/05/20 1829 03/06/20 0935  NA 130* 134*  K 3.6 3.6  CL 92* 97*  CO2 22 21*  GLUCOSE 103* 92  BUN 12 9  CREATININE 0.91 0.68  CALCIUM 8.9 8.7*  MG 1.9  --    GFR Estimated Creatinine Clearance: 50.6 mL/min (by C-G formula based on SCr of 0.68 mg/dL). Liver Function Tests: Recent Labs  Lab 03/05/20 1829  AST 17  ALT 6  ALKPHOS 101  BILITOT 1.1  PROT 7.2  ALBUMIN 3.2*   No results for input(s): LIPASE, AMYLASE in the last 168 hours. No results for input(s): AMMONIA in the last 168 hours. Coagulation profile No results for input(s): INR, PROTIME in the last 168 hours.  CBC: Recent Labs  Lab 03/05/20 1829 03/06/20 0935  WBC 6.7 8.2  NEUTROABS 5.4  --   HGB 11.8* 10.3*  HCT 35.2* 30.5*  MCV 99.7 98.4  PLT 97* 95*   Cardiac Enzymes: No results for input(s): CKTOTAL, CKMB, CKMBINDEX, TROPONINI in the last 168 hours. BNP: Invalid input(s): POCBNP CBG: No results for input(s): GLUCAP in the last 168 hours. D-Dimer No results for input(s): DDIMER in the last 72 hours. Hgb A1c No results for input(s): HGBA1C in the last 72 hours. Lipid Profile  No results for input(s): CHOL, HDL, LDLCALC, TRIG, CHOLHDL, LDLDIRECT in the last 72 hours. Thyroid function studies Recent Labs    03/06/20 0935  TSH 4.094   Anemia work up No results for input(s): VITAMINB12, FOLATE, FERRITIN, TIBC, IRON, RETICCTPCT in the last 72 hours. Microbiology Recent Results (from the past 240 hour(s))  Respiratory Panel by RT PCR (Flu A&B, Covid) - Nasopharyngeal Swab     Status: None   Collection Time: 03/05/20 10:50 PM   Specimen: Nasopharyngeal Swab  Result Value Ref Range Status   SARS Coronavirus 2 by RT PCR NEGATIVE NEGATIVE Final    Comment: (NOTE) SARS-CoV-2 target nucleic acids are NOT DETECTED. The SARS-CoV-2 RNA is generally detectable in upper respiratoy specimens during the acute phase of infection. The  lowest concentration of SARS-CoV-2 viral copies this assay can detect is 131 copies/mL. A negative result does not preclude SARS-Cov-2 infection and should not be used as the sole basis for treatment or other patient management decisions. A negative result may occur with  improper specimen collection/handling, submission of specimen other than nasopharyngeal swab, presence of viral mutation(s) within the areas targeted by this assay, and inadequate number of viral copies (<131 copies/mL). A negative result must be combined with clinical observations, patient history, and epidemiological information. The expected result is Negative. Fact Sheet for Patients:  PinkCheek.be Fact Sheet for Healthcare Providers:  GravelBags.it This test is not yet ap proved or cleared by the [place] FDA and  has been authorized for detection and/or diagnosis of SARS-CoV-2 by FDA under an Emergency Use Authorization (EUA). This EUA will remain  in effect (meaning this test can be used) for the duration of the COVID-19 declaration under Section 564(b)(1) of the Act, 21 U.S.C. section 360bbb-3(b)(1), unless the authorization is terminated or revoked sooner.    Influenza A by PCR NEGATIVE NEGATIVE Final   Influenza B by PCR NEGATIVE NEGATIVE Final    Comment: (NOTE) The Xpert Xpress SARS-CoV-2/FLU/RSV assay is intended as an aid in  the diagnosis of influenza from Nasopharyngeal swab specimens and  should not be used as a sole basis for treatment. Nasal washings and  aspirates are unacceptable for Xpert Xpress SARS-CoV-2/FLU/RSV  testing. Fact Sheet for Patients: PinkCheek.be Fact Sheet for Healthcare Providers: GravelBags.it This test is not yet approved or cleared by the [place] FDA and  has been authorized for detection and/or diagnosis of SARS-CoV-2 by  FDA under an Emergency  Use Authorization (EUA). This EUA will remain  in effect (meaning this test can be used) for the duration of the  Covid-19 declaration under Section 564(b)(1) of the Act, 21  U.S.C. section 360bbb-3(b)(1), unless the authorization is  terminated or revoked. Performed at [facility], [place] [address] [place], [place] [address]   MRSA PCR Screening     Status: None   Collection Time: 03/06/20  6:45 AM   Specimen: Nasopharyngeal  Result Value Ref Range Status   MRSA by PCR NEGATIVE NEGATIVE Final    Comment:        The GeneXpert MRSA Assay (FDA approved for NASAL specimens only), is one component of a comprehensive MRSA colonization surveillance program. It is not intended to diagnose MRSA infection nor to guide or monitor treatment for MRSA infections. Performed at [facility], [place] [address] [place],  [address]      Discharge Instructions:   Discharge Instructions    Discharge instructions   Complete by: As directed    Further  care as per residential hospice.   Increase activity slowly   Complete by: As directed      Allergies as of 03/08/2020      Reactions   Prednisone Other (See Comments)   Made the patient depressed      Medication List    STOP taking these medications   amoxicillin 500 MG capsule Commonly known as: AMOXIL   aspirin EC 81 MG tablet   Calcium Citrate + D3 200-250 MG-UNIT Tabs Generic drug: Calcium Citrate-Vitamin D   [place] Senior Tabs   cyanocobalamin 1000 MCG tablet   Desitin 13 % Crea Generic drug: Zinc Oxide   doxycycline 100 MG capsule Commonly known as: MONODOX   FeroSul 325 (65 FE) MG tablet Generic drug: ferrous sulfate   folic acid 1 MG tablet Commonly known as: FOLVITE   ibuprofen 200 MG tablet Commonly known as: ADVIL   isosorbide mononitrate 30 MG 24 hr tablet Commonly known as: IMDUR   rosuvastatin 10 MG tablet Commonly known as: CRESTOR   Xtandi 40 MG tablet Generic drug:  enzalutamide     TAKE these medications   acetaminophen 500 MG tablet Commonly known as: TYLENOL Take 500 mg by mouth every 6 (six) hours as needed (for pain).   carvedilol 3.125 MG tablet Commonly known as: COREG TAKE 1 TABLET BY MOUTH TWICE DAILY.   DULoxetine 60 MG capsule Commonly known as: Cymbalta Take 1 capsule (60 mg total) by mouth daily.   lamoTRIgine 25 MG tablet Commonly known as: LAMICTAL One tablet twice a day for 2 weeks, then take 2 tablets twice a day for 2 weeks, then take 3 tablets twice a day   methotrexate 2.5 MG tablet Commonly known as: RHEUMATREX Take 10 mg by mouth every Tuesday.   nitroGLYCERIN 0.4 MG SL tablet Commonly known as: NITROSTAT 1 TAB UNDER TONGUE AS NEEDED FOR CHEST PAIN. MAY REPEAT EVERY 5 MIN FOR A TOTAL OF 3 DOSES. What changed: See the new instructions.   polyethylene glycol 17 g packet Commonly known as: MIRALAX / GLYCOLAX Take 17 g by mouth daily as needed for mild constipation. What changed: reasons to take this   traMADol 50 MG tablet Commonly known as: ULTRAM Take 1 tablet (50 mg total) by mouth every 6 (six) hours as needed (for pain).       Time coordinating discharge: 39 minutes  Signed:  Carless Slatten  [facility] 03/08/2020, 11:54 AM

## 2020-03-08 NOTE — Progress Notes (Addendum)
Manufacturing engineer [facility]) Hospital Liaison Note:  Spoke to patient daughter June Leap to confirm interest and offer [place] bed for patient today.   Explained services, answered questions and supported family. Patient family agreeable to transfer to [facility] today pending Hospital MD approval/discharge.    Patient daughter to complete [employer] via docusign and will update this note/TOC once completed. [facility] TOC Aware of above.   Please fax discharge summary to [phone] and arrange transportation once paper work is completed.  RN - Please call report to [phone] at your convenience.  Thank you,  Gar Ponto, RN [facility] ( on [place]) [phone]  At [address] [facility] TOC made aware that [facility] registration paper work is complete and patient is ready for transport.

## 2020-03-08 NOTE — Plan of Care (Signed)
  Problem: Education: Goal: Knowledge of General Education information will improve Description Including pain rating scale, medication(s)/side effects and non-pharmacologic comfort measures Outcome: Progressing   

## 2020-03-12 ENCOUNTER — Other Ambulatory Visit (HOSPITAL_COMMUNITY): Payer: Medicare Other

## 2020-03-13 DEATH — deceased

## 2020-03-19 ENCOUNTER — Encounter (HOSPITAL_COMMUNITY): Payer: Self-pay

## 2020-03-19 ENCOUNTER — Encounter (HOSPITAL_COMMUNITY): Payer: Medicare Other

## 2020-04-19 ENCOUNTER — Ambulatory Visit: Payer: Self-pay | Admitting: Family Medicine

## 2020-05-20 ENCOUNTER — Ambulatory Visit: Payer: Medicare Other | Admitting: Oncology

## 2020-05-20 ENCOUNTER — Other Ambulatory Visit: Payer: Medicare Other

## 2020-06-14 ENCOUNTER — Ambulatory Visit: Payer: Medicare Other | Admitting: Internal Medicine
